# Patient Record
Sex: Female | Born: 1970 | Race: Black or African American | Hispanic: No | Marital: Single | Smoking: Current every day smoker
Health system: Southern US, Community
[De-identification: ages and names within clinical notes are randomized; demographics above are authoritative.]

## PROBLEM LIST (undated history)

## (undated) DIAGNOSIS — F32A Depression, unspecified: Secondary | ICD-10-CM

## (undated) DIAGNOSIS — G40909 Epilepsy, unspecified, not intractable, without status epilepticus: Secondary | ICD-10-CM

## (undated) DIAGNOSIS — E1040 Type 1 diabetes mellitus with diabetic neuropathy, unspecified: Secondary | ICD-10-CM

## (undated) DIAGNOSIS — F3181 Bipolar II disorder: Secondary | ICD-10-CM

## (undated) DIAGNOSIS — B1920 Unspecified viral hepatitis C without hepatic coma: Secondary | ICD-10-CM

## (undated) DIAGNOSIS — M1990 Unspecified osteoarthritis, unspecified site: Secondary | ICD-10-CM

## (undated) DIAGNOSIS — M419 Scoliosis, unspecified: Secondary | ICD-10-CM

## (undated) DIAGNOSIS — H269 Unspecified cataract: Secondary | ICD-10-CM

## (undated) DIAGNOSIS — E1110 Type 2 diabetes mellitus with ketoacidosis without coma: Secondary | ICD-10-CM

## (undated) DIAGNOSIS — R011 Cardiac murmur, unspecified: Secondary | ICD-10-CM

## (undated) DIAGNOSIS — G629 Polyneuropathy, unspecified: Secondary | ICD-10-CM

## (undated) DIAGNOSIS — K2970 Gastritis, unspecified, without bleeding: Secondary | ICD-10-CM

## (undated) DIAGNOSIS — D649 Anemia, unspecified: Secondary | ICD-10-CM

## (undated) DIAGNOSIS — N200 Calculus of kidney: Secondary | ICD-10-CM

## (undated) DIAGNOSIS — F329 Major depressive disorder, single episode, unspecified: Secondary | ICD-10-CM

## (undated) DIAGNOSIS — I10 Essential (primary) hypertension: Secondary | ICD-10-CM

## (undated) DIAGNOSIS — F4310 Post-traumatic stress disorder, unspecified: Secondary | ICD-10-CM

---

## 1998-01-12 ENCOUNTER — Other Ambulatory Visit: Admission: RE | Admit: 1998-01-12 | Discharge: 1998-01-12 | Payer: Self-pay

## 1998-08-04 ENCOUNTER — Emergency Department (HOSPITAL_COMMUNITY): Admission: EM | Admit: 1998-08-04 | Discharge: 1998-08-04 | Payer: Self-pay | Admitting: Emergency Medicine

## 1998-08-23 ENCOUNTER — Observation Stay (HOSPITAL_COMMUNITY): Admission: AD | Admit: 1998-08-23 | Discharge: 1998-08-24 | Payer: Self-pay | Admitting: *Deleted

## 1998-09-18 ENCOUNTER — Emergency Department (HOSPITAL_COMMUNITY): Admission: EM | Admit: 1998-09-18 | Discharge: 1998-09-18 | Payer: Self-pay | Admitting: *Deleted

## 1999-01-17 ENCOUNTER — Emergency Department (HOSPITAL_COMMUNITY): Admission: EM | Admit: 1999-01-17 | Discharge: 1999-01-17 | Payer: Self-pay | Admitting: Emergency Medicine

## 1999-01-17 ENCOUNTER — Encounter: Payer: Self-pay | Admitting: Emergency Medicine

## 1999-01-21 ENCOUNTER — Inpatient Hospital Stay (HOSPITAL_COMMUNITY): Admission: EM | Admit: 1999-01-21 | Discharge: 1999-01-24 | Payer: Self-pay | Admitting: Emergency Medicine

## 1999-02-02 ENCOUNTER — Emergency Department (HOSPITAL_COMMUNITY): Admission: EM | Admit: 1999-02-02 | Discharge: 1999-02-02 | Payer: Self-pay | Admitting: Emergency Medicine

## 1999-06-12 ENCOUNTER — Inpatient Hospital Stay (HOSPITAL_COMMUNITY): Admission: EM | Admit: 1999-06-12 | Discharge: 1999-06-15 | Payer: Self-pay | Admitting: *Deleted

## 1999-06-12 ENCOUNTER — Encounter: Payer: Self-pay | Admitting: Internal Medicine

## 1999-06-13 ENCOUNTER — Encounter: Payer: Self-pay | Admitting: Internal Medicine

## 1999-08-06 ENCOUNTER — Emergency Department (HOSPITAL_COMMUNITY): Admission: EM | Admit: 1999-08-06 | Discharge: 1999-08-07 | Payer: Self-pay | Admitting: Emergency Medicine

## 1999-10-27 ENCOUNTER — Inpatient Hospital Stay (HOSPITAL_COMMUNITY): Admission: EM | Admit: 1999-10-27 | Discharge: 1999-10-31 | Payer: Self-pay | Admitting: Emergency Medicine

## 1999-10-27 ENCOUNTER — Encounter: Payer: Self-pay | Admitting: Infectious Diseases

## 1999-12-01 ENCOUNTER — Emergency Department (HOSPITAL_COMMUNITY): Admission: EM | Admit: 1999-12-01 | Discharge: 1999-12-01 | Payer: Self-pay | Admitting: Emergency Medicine

## 1999-12-01 ENCOUNTER — Encounter: Payer: Self-pay | Admitting: Emergency Medicine

## 2000-02-10 ENCOUNTER — Emergency Department (HOSPITAL_COMMUNITY): Admission: EM | Admit: 2000-02-10 | Discharge: 2000-02-10 | Payer: Self-pay | Admitting: Emergency Medicine

## 2000-02-19 ENCOUNTER — Emergency Department (HOSPITAL_COMMUNITY): Admission: EM | Admit: 2000-02-19 | Discharge: 2000-02-19 | Payer: Self-pay | Admitting: Emergency Medicine

## 2000-02-19 ENCOUNTER — Encounter: Payer: Self-pay | Admitting: Emergency Medicine

## 2000-03-27 ENCOUNTER — Emergency Department (HOSPITAL_COMMUNITY): Admission: EM | Admit: 2000-03-27 | Discharge: 2000-03-27 | Payer: Self-pay | Admitting: *Deleted

## 2000-04-21 ENCOUNTER — Emergency Department (HOSPITAL_COMMUNITY): Admission: EM | Admit: 2000-04-21 | Discharge: 2000-04-22 | Payer: Self-pay | Admitting: Emergency Medicine

## 2000-04-27 ENCOUNTER — Encounter: Payer: Self-pay | Admitting: Family Medicine

## 2000-04-27 ENCOUNTER — Inpatient Hospital Stay (HOSPITAL_COMMUNITY): Admission: EM | Admit: 2000-04-27 | Discharge: 2000-04-30 | Payer: Self-pay | Admitting: Emergency Medicine

## 2000-06-01 ENCOUNTER — Encounter: Payer: Self-pay | Admitting: Emergency Medicine

## 2000-06-01 ENCOUNTER — Inpatient Hospital Stay (HOSPITAL_COMMUNITY): Admission: EM | Admit: 2000-06-01 | Discharge: 2000-06-03 | Payer: Self-pay | Admitting: Emergency Medicine

## 2000-12-11 ENCOUNTER — Inpatient Hospital Stay (HOSPITAL_COMMUNITY): Admission: EM | Admit: 2000-12-11 | Discharge: 2000-12-12 | Payer: Self-pay | Admitting: Emergency Medicine

## 2000-12-11 ENCOUNTER — Encounter: Payer: Self-pay | Admitting: Emergency Medicine

## 2000-12-12 ENCOUNTER — Encounter: Payer: Self-pay | Admitting: Internal Medicine

## 2001-07-29 ENCOUNTER — Emergency Department (HOSPITAL_COMMUNITY): Admission: EM | Admit: 2001-07-29 | Discharge: 2001-07-30 | Payer: Self-pay | Admitting: *Deleted

## 2003-01-16 ENCOUNTER — Emergency Department (HOSPITAL_COMMUNITY): Admission: EM | Admit: 2003-01-16 | Discharge: 2003-01-16 | Payer: Self-pay | Admitting: Emergency Medicine

## 2003-01-27 ENCOUNTER — Emergency Department (HOSPITAL_COMMUNITY): Admission: EM | Admit: 2003-01-27 | Discharge: 2003-01-27 | Payer: Self-pay | Admitting: Emergency Medicine

## 2003-01-28 ENCOUNTER — Encounter: Payer: Self-pay | Admitting: Internal Medicine

## 2003-01-28 ENCOUNTER — Encounter: Admission: RE | Admit: 2003-01-28 | Discharge: 2003-01-28 | Payer: Self-pay | Admitting: Internal Medicine

## 2003-01-28 ENCOUNTER — Ambulatory Visit (HOSPITAL_COMMUNITY): Admission: RE | Admit: 2003-01-28 | Discharge: 2003-01-28 | Payer: Self-pay | Admitting: Internal Medicine

## 2003-02-11 ENCOUNTER — Encounter: Admission: RE | Admit: 2003-02-11 | Discharge: 2003-02-11 | Payer: Self-pay | Admitting: Internal Medicine

## 2003-02-22 ENCOUNTER — Encounter: Admission: RE | Admit: 2003-02-22 | Discharge: 2003-02-22 | Payer: Self-pay | Admitting: Internal Medicine

## 2003-03-01 ENCOUNTER — Encounter: Admission: RE | Admit: 2003-03-01 | Discharge: 2003-03-01 | Payer: Self-pay | Admitting: Internal Medicine

## 2003-05-23 ENCOUNTER — Emergency Department (HOSPITAL_COMMUNITY): Admission: EM | Admit: 2003-05-23 | Discharge: 2003-05-23 | Payer: Self-pay | Admitting: Emergency Medicine

## 2003-06-14 ENCOUNTER — Inpatient Hospital Stay (HOSPITAL_COMMUNITY): Admission: EM | Admit: 2003-06-14 | Discharge: 2003-06-16 | Payer: Self-pay | Admitting: Emergency Medicine

## 2003-11-22 ENCOUNTER — Inpatient Hospital Stay (HOSPITAL_COMMUNITY): Admission: EM | Admit: 2003-11-22 | Discharge: 2003-11-26 | Payer: Self-pay | Admitting: Emergency Medicine

## 2003-12-08 ENCOUNTER — Encounter: Admission: RE | Admit: 2003-12-08 | Discharge: 2003-12-08 | Payer: Self-pay | Admitting: Internal Medicine

## 2004-03-01 ENCOUNTER — Emergency Department (HOSPITAL_COMMUNITY): Admission: EM | Admit: 2004-03-01 | Discharge: 2004-03-01 | Payer: Self-pay | Admitting: Emergency Medicine

## 2004-03-09 ENCOUNTER — Ambulatory Visit: Payer: Self-pay | Admitting: Internal Medicine

## 2004-03-12 ENCOUNTER — Emergency Department (HOSPITAL_COMMUNITY): Admission: EM | Admit: 2004-03-12 | Discharge: 2004-03-13 | Payer: Self-pay | Admitting: Emergency Medicine

## 2005-06-17 HISTORY — PX: ANKLE SURGERY: SHX546

## 2006-04-18 ENCOUNTER — Emergency Department (HOSPITAL_COMMUNITY): Admission: EM | Admit: 2006-04-18 | Discharge: 2006-04-18 | Payer: Self-pay | Admitting: *Deleted

## 2006-04-25 ENCOUNTER — Emergency Department (HOSPITAL_COMMUNITY): Admission: EM | Admit: 2006-04-25 | Discharge: 2006-04-26 | Payer: Self-pay | Admitting: Emergency Medicine

## 2006-06-04 ENCOUNTER — Ambulatory Visit: Admission: RE | Admit: 2006-06-04 | Discharge: 2006-06-04 | Payer: Self-pay | Admitting: Orthopaedic Surgery

## 2006-07-04 ENCOUNTER — Emergency Department (HOSPITAL_COMMUNITY): Admission: EM | Admit: 2006-07-04 | Discharge: 2006-07-04 | Payer: Self-pay | Admitting: Emergency Medicine

## 2006-07-10 ENCOUNTER — Ambulatory Visit: Payer: Self-pay | Admitting: Nurse Practitioner

## 2006-07-14 ENCOUNTER — Ambulatory Visit: Payer: Self-pay | Admitting: Nurse Practitioner

## 2006-08-01 ENCOUNTER — Inpatient Hospital Stay (HOSPITAL_COMMUNITY): Admission: RE | Admit: 2006-08-01 | Discharge: 2006-08-03 | Payer: Self-pay | Admitting: Orthopaedic Surgery

## 2006-08-22 ENCOUNTER — Emergency Department (HOSPITAL_COMMUNITY): Admission: EM | Admit: 2006-08-22 | Discharge: 2006-08-23 | Payer: Self-pay | Admitting: Emergency Medicine

## 2006-08-27 ENCOUNTER — Emergency Department (HOSPITAL_COMMUNITY): Admission: EM | Admit: 2006-08-27 | Discharge: 2006-08-27 | Payer: Self-pay | Admitting: Emergency Medicine

## 2006-08-29 ENCOUNTER — Emergency Department (HOSPITAL_COMMUNITY): Admission: EM | Admit: 2006-08-29 | Discharge: 2006-08-30 | Payer: Self-pay | Admitting: Emergency Medicine

## 2006-08-31 ENCOUNTER — Emergency Department (HOSPITAL_COMMUNITY): Admission: EM | Admit: 2006-08-31 | Discharge: 2006-08-31 | Payer: Self-pay | Admitting: Emergency Medicine

## 2006-10-24 ENCOUNTER — Ambulatory Visit (HOSPITAL_COMMUNITY): Admission: RE | Admit: 2006-10-24 | Discharge: 2006-10-25 | Payer: Self-pay | Admitting: Orthopaedic Surgery

## 2007-02-24 ENCOUNTER — Emergency Department (HOSPITAL_COMMUNITY): Admission: EM | Admit: 2007-02-24 | Discharge: 2007-02-24 | Payer: Self-pay | Admitting: *Deleted

## 2007-02-25 ENCOUNTER — Emergency Department (HOSPITAL_COMMUNITY): Admission: EM | Admit: 2007-02-25 | Discharge: 2007-02-25 | Payer: Self-pay | Admitting: Emergency Medicine

## 2007-06-01 ENCOUNTER — Emergency Department (HOSPITAL_COMMUNITY): Admission: EM | Admit: 2007-06-01 | Discharge: 2007-06-01 | Payer: Self-pay | Admitting: Emergency Medicine

## 2007-06-26 ENCOUNTER — Inpatient Hospital Stay (HOSPITAL_COMMUNITY): Admission: EM | Admit: 2007-06-26 | Discharge: 2007-06-29 | Payer: Self-pay | Admitting: Emergency Medicine

## 2007-07-17 ENCOUNTER — Ambulatory Visit: Payer: Self-pay | Admitting: Internal Medicine

## 2007-07-17 ENCOUNTER — Encounter (INDEPENDENT_AMBULATORY_CARE_PROVIDER_SITE_OTHER): Payer: Self-pay | Admitting: Nurse Practitioner

## 2007-08-03 ENCOUNTER — Ambulatory Visit: Payer: Self-pay | Admitting: Internal Medicine

## 2007-08-03 ENCOUNTER — Encounter (INDEPENDENT_AMBULATORY_CARE_PROVIDER_SITE_OTHER): Payer: Self-pay | Admitting: Nurse Practitioner

## 2007-08-03 LAB — CONVERTED CEMR LAB
Cholesterol: 164 mg/dL (ref 0–200)
LDL Cholesterol: 82 mg/dL (ref 0–99)
Total CHOL/HDL Ratio: 2.7
Triglycerides: 111 mg/dL (ref <150)
VLDL: 22 mg/dL (ref 0–40)

## 2008-05-10 ENCOUNTER — Ambulatory Visit: Payer: Self-pay | Admitting: *Deleted

## 2008-06-06 ENCOUNTER — Ambulatory Visit: Payer: Self-pay | Admitting: Family Medicine

## 2008-06-08 ENCOUNTER — Ambulatory Visit: Payer: Self-pay | Admitting: Internal Medicine

## 2008-06-21 ENCOUNTER — Ambulatory Visit: Payer: Self-pay | Admitting: Internal Medicine

## 2008-06-21 ENCOUNTER — Encounter (INDEPENDENT_AMBULATORY_CARE_PROVIDER_SITE_OTHER): Payer: Self-pay | Admitting: Internal Medicine

## 2008-06-21 LAB — CONVERTED CEMR LAB
ALT: 92 units/L — ABNORMAL HIGH (ref 0–35)
AST: 121 units/L — ABNORMAL HIGH (ref 0–37)
Albumin: 3.7 g/dL (ref 3.5–5.2)
Basophils Absolute: 0 10*3/uL (ref 0.0–0.1)
Basophils Relative: 0 % (ref 0–1)
Calcium: 9 mg/dL (ref 8.4–10.5)
Chloride: 104 meq/L (ref 96–112)
MCHC: 30.5 g/dL (ref 30.0–36.0)
Neutro Abs: 3 10*3/uL (ref 1.7–7.7)
Neutrophils Relative %: 36 % — ABNORMAL LOW (ref 43–77)
Potassium: 4.2 meq/L (ref 3.5–5.3)
RDW: 16.9 % — ABNORMAL HIGH (ref 11.5–15.5)
Sodium: 139 meq/L (ref 135–145)
TSH: 4.178 microintl units/mL (ref 0.350–4.50)
Total CHOL/HDL Ratio: 3.7
Total Protein: 8 g/dL (ref 6.0–8.3)

## 2008-06-22 ENCOUNTER — Encounter (INDEPENDENT_AMBULATORY_CARE_PROVIDER_SITE_OTHER): Payer: Self-pay | Admitting: Internal Medicine

## 2008-06-22 LAB — CONVERTED CEMR LAB
Hep A Total Ab: POSITIVE — AB
Hep B S Ab: NEGATIVE

## 2008-08-11 ENCOUNTER — Ambulatory Visit: Payer: Self-pay | Admitting: Family Medicine

## 2008-08-16 ENCOUNTER — Encounter (INDEPENDENT_AMBULATORY_CARE_PROVIDER_SITE_OTHER): Payer: Self-pay | Admitting: Internal Medicine

## 2008-08-16 ENCOUNTER — Ambulatory Visit: Payer: Self-pay | Admitting: Internal Medicine

## 2008-08-16 LAB — CONVERTED CEMR LAB: Microalb, Ur: 0.5 mg/dL (ref 0.00–1.89)

## 2008-08-18 ENCOUNTER — Encounter (INDEPENDENT_AMBULATORY_CARE_PROVIDER_SITE_OTHER): Payer: Self-pay | Admitting: Internal Medicine

## 2008-08-18 ENCOUNTER — Ambulatory Visit: Payer: Self-pay | Admitting: Internal Medicine

## 2008-08-18 LAB — CONVERTED CEMR LAB
Alkaline Phosphatase: 80 units/L (ref 39–117)
HCV Quantitative: 8820000 intl units/mL — ABNORMAL HIGH (ref <43)
Total Protein: 7.4 g/dL (ref 6.0–8.3)

## 2008-09-08 ENCOUNTER — Encounter (INDEPENDENT_AMBULATORY_CARE_PROVIDER_SITE_OTHER): Payer: Self-pay | Admitting: *Deleted

## 2008-09-22 ENCOUNTER — Ambulatory Visit: Payer: Self-pay | Admitting: Internal Medicine

## 2008-09-28 ENCOUNTER — Ambulatory Visit (HOSPITAL_COMMUNITY): Admission: RE | Admit: 2008-09-28 | Discharge: 2008-09-28 | Payer: Self-pay | Admitting: Internal Medicine

## 2008-10-07 ENCOUNTER — Encounter (INDEPENDENT_AMBULATORY_CARE_PROVIDER_SITE_OTHER): Payer: Self-pay | Admitting: *Deleted

## 2008-10-13 ENCOUNTER — Ambulatory Visit: Payer: Self-pay | Admitting: Gastroenterology

## 2008-10-24 ENCOUNTER — Ambulatory Visit: Payer: Self-pay | Admitting: Internal Medicine

## 2008-11-28 ENCOUNTER — Ambulatory Visit: Payer: Self-pay | Admitting: Internal Medicine

## 2008-11-29 ENCOUNTER — Ambulatory Visit: Payer: Self-pay | Admitting: Internal Medicine

## 2008-12-26 ENCOUNTER — Ambulatory Visit: Payer: Self-pay | Admitting: Internal Medicine

## 2008-12-27 ENCOUNTER — Encounter (INDEPENDENT_AMBULATORY_CARE_PROVIDER_SITE_OTHER): Payer: Self-pay | Admitting: Diagnostic Radiology

## 2008-12-27 ENCOUNTER — Ambulatory Visit (HOSPITAL_COMMUNITY): Admission: RE | Admit: 2008-12-27 | Discharge: 2008-12-27 | Payer: Self-pay | Admitting: Gastroenterology

## 2008-12-28 ENCOUNTER — Ambulatory Visit (HOSPITAL_COMMUNITY): Admission: RE | Admit: 2008-12-28 | Discharge: 2008-12-28 | Payer: Self-pay | Admitting: Gastroenterology

## 2009-01-26 ENCOUNTER — Ambulatory Visit: Payer: Self-pay | Admitting: Gastroenterology

## 2009-01-27 ENCOUNTER — Ambulatory Visit: Payer: Self-pay | Admitting: Internal Medicine

## 2009-02-01 ENCOUNTER — Ambulatory Visit: Payer: Self-pay | Admitting: Family Medicine

## 2009-05-01 ENCOUNTER — Encounter (INDEPENDENT_AMBULATORY_CARE_PROVIDER_SITE_OTHER): Payer: Self-pay | Admitting: Internal Medicine

## 2009-05-01 ENCOUNTER — Ambulatory Visit: Payer: Self-pay | Admitting: Family Medicine

## 2009-07-04 ENCOUNTER — Inpatient Hospital Stay (HOSPITAL_COMMUNITY): Admission: EM | Admit: 2009-07-04 | Discharge: 2009-07-06 | Payer: Self-pay | Admitting: Emergency Medicine

## 2009-08-18 ENCOUNTER — Encounter (INDEPENDENT_AMBULATORY_CARE_PROVIDER_SITE_OTHER): Payer: Self-pay | Admitting: Internal Medicine

## 2009-08-18 ENCOUNTER — Ambulatory Visit: Payer: Self-pay | Admitting: Family Medicine

## 2009-08-18 LAB — CONVERTED CEMR LAB: Microalb, Ur: 0.5 mg/dL (ref 0.00–1.89)

## 2009-12-04 ENCOUNTER — Ambulatory Visit: Payer: Self-pay | Admitting: Family Medicine

## 2009-12-04 ENCOUNTER — Encounter (INDEPENDENT_AMBULATORY_CARE_PROVIDER_SITE_OTHER): Payer: Self-pay | Admitting: Internal Medicine

## 2009-12-04 LAB — CONVERTED CEMR LAB
ALT: 50 units/L — ABNORMAL HIGH (ref 0–35)
AST: 59 units/L — ABNORMAL HIGH (ref 0–37)
Albumin: 4.2 g/dL (ref 3.5–5.2)
Calcium: 9.9 mg/dL (ref 8.4–10.5)
Chloride: 100 meq/L (ref 96–112)
HCT: 37.6 % (ref 36.0–46.0)
Lymphocytes Relative: 54 % — ABNORMAL HIGH (ref 12–46)
Lymphs Abs: 5.6 10*3/uL — ABNORMAL HIGH (ref 0.7–4.0)
Neutro Abs: 4 10*3/uL (ref 1.7–7.7)
Neutrophils Relative %: 38 % — ABNORMAL LOW (ref 43–77)
Platelets: 114 10*3/uL — ABNORMAL LOW (ref 150–400)
Potassium: 4.4 meq/L (ref 3.5–5.3)
Sodium: 132 meq/L — ABNORMAL LOW (ref 135–145)
WBC: 10.4 10*3/uL (ref 4.0–10.5)

## 2010-02-07 ENCOUNTER — Observation Stay (HOSPITAL_COMMUNITY): Admission: EM | Admit: 2010-02-07 | Discharge: 2010-02-07 | Payer: Self-pay | Admitting: Emergency Medicine

## 2010-04-15 ENCOUNTER — Inpatient Hospital Stay (HOSPITAL_COMMUNITY): Admission: EM | Admit: 2010-04-15 | Discharge: 2010-04-18 | Payer: Self-pay | Admitting: Emergency Medicine

## 2010-04-21 ENCOUNTER — Emergency Department (HOSPITAL_COMMUNITY)
Admission: EM | Admit: 2010-04-21 | Discharge: 2010-04-21 | Payer: Self-pay | Source: Home / Self Care | Admitting: Emergency Medicine

## 2010-07-08 ENCOUNTER — Encounter: Payer: Self-pay | Admitting: Internal Medicine

## 2010-08-29 LAB — HIV ANTIBODY (ROUTINE TESTING W REFLEX): HIV: NONREACTIVE

## 2010-08-29 LAB — COMPREHENSIVE METABOLIC PANEL
ALT: 54 U/L — ABNORMAL HIGH (ref 0–35)
AST: 52 U/L — ABNORMAL HIGH (ref 0–37)
Alkaline Phosphatase: 116 U/L (ref 39–117)
BUN: 9 mg/dL (ref 6–23)
CO2: 20 mEq/L (ref 19–32)
CO2: 24 mEq/L (ref 19–32)
Calcium: 8.6 mg/dL (ref 8.4–10.5)
Chloride: 94 mEq/L — ABNORMAL LOW (ref 96–112)
Creatinine, Ser: 0.52 mg/dL (ref 0.4–1.2)
GFR calc Af Amer: >60 mL/min (ref >60)
GFR calc non Af Amer: 52 mL/min — ABNORMAL LOW (ref >60)
GFR calc non Af Amer: >60 mL/min (ref >60)
Glucose, Bld: 275 mg/dL — ABNORMAL HIGH (ref 70–99)
Potassium: 5 mEq/L (ref 3.5–5.1)
Sodium: 133 mEq/L — ABNORMAL LOW (ref 135–145)
Sodium: 135 mEq/L (ref 135–145)
Total Bilirubin: 1.3 mg/dL — ABNORMAL HIGH (ref 0.3–1.2)
Total Protein: 6 g/dL (ref 6.0–8.3)

## 2010-08-29 LAB — DIFFERENTIAL
Basophils Absolute: 0 10*3/uL (ref 0.0–0.1)
Basophils Absolute: 0 10*3/uL (ref 0.0–0.1)
Basophils Absolute: 0.1 10*3/uL (ref 0.0–0.1)
Basophils Relative: 1 % (ref 0–1)
Eosinophils Absolute: 0.4 10*3/uL (ref 0.0–0.7)
Eosinophils Relative: 1 % (ref 0–5)
Eosinophils Relative: 4 % (ref 0–5)
Lymphocytes Relative: 25 % (ref 12–46)
Lymphs Abs: 4.9 10*3/uL — ABNORMAL HIGH (ref 0.7–4.0)
Monocytes Absolute: 0.6 10*3/uL (ref 0.1–1.0)
Monocytes Absolute: 0.8 10*3/uL (ref 0.1–1.0)
Monocytes Absolute: 1 10*3/uL (ref 0.1–1.0)
Monocytes Relative: 7 % (ref 3–12)
Monocytes Relative: 7 % (ref 3–12)
Neutro Abs: 5.2 10*3/uL (ref 1.7–7.7)

## 2010-08-29 LAB — GLUCOSE, CAPILLARY
Glucose-Capillary: 125 mg/dL — ABNORMAL HIGH (ref 70–99)
Glucose-Capillary: 125 mg/dL — ABNORMAL HIGH (ref 70–99)
Glucose-Capillary: 154 mg/dL — ABNORMAL HIGH (ref 70–99)
Glucose-Capillary: 161 mg/dL — ABNORMAL HIGH (ref 70–99)
Glucose-Capillary: 211 mg/dL — ABNORMAL HIGH (ref 70–99)
Glucose-Capillary: 222 mg/dL — ABNORMAL HIGH (ref 70–99)
Glucose-Capillary: 228 mg/dL — ABNORMAL HIGH (ref 70–99)
Glucose-Capillary: 282 mg/dL — ABNORMAL HIGH (ref 70–99)
Glucose-Capillary: 305 mg/dL — ABNORMAL HIGH (ref 70–99)
Glucose-Capillary: 320 mg/dL — ABNORMAL HIGH (ref 70–99)
Glucose-Capillary: 340 mg/dL — ABNORMAL HIGH (ref 70–99)
Glucose-Capillary: 344 mg/dL — ABNORMAL HIGH (ref 70–99)
Glucose-Capillary: 428 mg/dL — ABNORMAL HIGH (ref 70–99)

## 2010-08-29 LAB — CBC
HCT: 35.6 % — ABNORMAL LOW (ref 36.0–46.0)
HCT: 36.3 % (ref 36.0–46.0)
HCT: 36.5 % (ref 36.0–46.0)
Hemoglobin: 11.4 g/dL — ABNORMAL LOW (ref 12.0–15.0)
Hemoglobin: 11.4 g/dL — ABNORMAL LOW (ref 12.0–15.0)
Hemoglobin: 11.8 g/dL — ABNORMAL LOW (ref 12.0–15.0)
Hemoglobin: 11.8 g/dL — ABNORMAL LOW (ref 12.0–15.0)
Hemoglobin: 14.7 g/dL (ref 12.0–15.0)
MCH: 24.3 pg — ABNORMAL LOW (ref 26.0–34.0)
MCH: 24.3 pg — ABNORMAL LOW (ref 26.0–34.0)
MCH: 24.3 pg — ABNORMAL LOW (ref 26.0–34.0)
MCH: 24.3 pg — ABNORMAL LOW (ref 26.0–34.0)
MCHC: 31.9 g/dL (ref 30.0–36.0)
MCHC: 32 g/dL (ref 30.0–36.0)
MCHC: 32.5 g/dL (ref 30.0–36.0)
MCV: 73.6 fL — ABNORMAL LOW (ref 78.0–100.0)
MCV: 76.2 fL — ABNORMAL LOW (ref 78.0–100.0)
MCV: 76.2 fL — ABNORMAL LOW (ref 78.0–100.0)
RBC: 4.67 MIL/uL (ref 3.87–5.11)
RBC: 6.05 MIL/uL — ABNORMAL HIGH (ref 3.87–5.11)
RDW: 14 % (ref 11.5–15.5)
RDW: 14.4 % (ref 11.5–15.5)
RDW: 14.5 % (ref 11.5–15.5)
WBC: 11.1 10*3/uL — ABNORMAL HIGH (ref 4.0–10.5)
WBC: 14.5 10*3/uL — ABNORMAL HIGH (ref 4.0–10.5)

## 2010-08-29 LAB — URINE MICROSCOPIC-ADD ON

## 2010-08-29 LAB — CARDIAC PANEL(CRET KIN+CKTOT+MB+TROPI)
CK, MB: 1.4 ng/mL (ref 0.3–4.0)
CK, MB: 1.5 ng/mL (ref 0.3–4.0)
Relative Index: INVALID (ref 0.0–2.5)
Relative Index: INVALID (ref 0.0–2.5)
Total CK: 57 U/L (ref 7–177)
Total CK: 60 U/L (ref 7–177)
Total CK: 80 U/L (ref 7–177)
Troponin I: <0.01 ng/mL (ref 0.00–0.06)

## 2010-08-29 LAB — BASIC METABOLIC PANEL
BUN: 10 mg/dL (ref 6–23)
BUN: 11 mg/dL (ref 6–23)
BUN: 12 mg/dL (ref 6–23)
BUN: 13 mg/dL (ref 6–23)
BUN: 13 mg/dL (ref 6–23)
BUN: 4 mg/dL — ABNORMAL LOW (ref 6–23)
CO2: 24 mEq/L (ref 19–32)
CO2: 24 mEq/L (ref 19–32)
CO2: 25 mEq/L (ref 19–32)
CO2: 27 mEq/L (ref 19–32)
Calcium: 8.5 mg/dL (ref 8.4–10.5)
Calcium: 8.5 mg/dL (ref 8.4–10.5)
Calcium: 8.5 mg/dL (ref 8.4–10.5)
Calcium: 8.7 mg/dL (ref 8.4–10.5)
Calcium: 8.8 mg/dL (ref 8.4–10.5)
Chloride: 103 mEq/L (ref 96–112)
Chloride: 106 mEq/L (ref 96–112)
Chloride: 106 mEq/L (ref 96–112)
Chloride: 106 mEq/L (ref 96–112)
Creatinine, Ser: 0.67 mg/dL (ref 0.4–1.2)
Creatinine, Ser: 0.88 mg/dL (ref 0.4–1.2)
Creatinine, Ser: 0.98 mg/dL (ref 0.4–1.2)
GFR calc Af Amer: >60 mL/min (ref >60)
GFR calc non Af Amer: >60 mL/min (ref >60)
GFR calc non Af Amer: >60 mL/min (ref >60)
Glucose, Bld: 141 mg/dL — ABNORMAL HIGH (ref 70–99)
Glucose, Bld: 240 mg/dL — ABNORMAL HIGH (ref 70–99)
Glucose, Bld: 267 mg/dL — ABNORMAL HIGH (ref 70–99)
Glucose, Bld: 328 mg/dL — ABNORMAL HIGH (ref 70–99)
Glucose, Bld: 336 mg/dL — ABNORMAL HIGH (ref 70–99)
Glucose, Bld: 344 mg/dL — ABNORMAL HIGH (ref 70–99)
Potassium: 3.3 mEq/L — ABNORMAL LOW (ref 3.5–5.1)
Potassium: 3.8 mEq/L (ref 3.5–5.1)
Potassium: 3.9 mEq/L (ref 3.5–5.1)
Potassium: 3.9 mEq/L (ref 3.5–5.1)
Potassium: 4.4 mEq/L (ref 3.5–5.1)
Sodium: 134 mEq/L — ABNORMAL LOW (ref 135–145)
Sodium: 134 mEq/L — ABNORMAL LOW (ref 135–145)
Sodium: 136 mEq/L (ref 135–145)
Sodium: 137 mEq/L (ref 135–145)

## 2010-08-29 LAB — URINALYSIS, ROUTINE W REFLEX MICROSCOPIC
Bilirubin Urine: NEGATIVE
Glucose, UA: >1000 mg/dL — AB
Ketones, ur: >80 mg/dL — AB
Leukocytes, UA: NEGATIVE
Specific Gravity, Urine: 1.01 (ref 1.005–1.030)
pH: 5 (ref 5.0–8.0)

## 2010-08-29 LAB — LIPID PANEL
Cholesterol: 134 mg/dL (ref 0–200)
HDL: 40 mg/dL (ref >39)
Total CHOL/HDL Ratio: 3.4 RATIO

## 2010-08-29 LAB — ETHANOL: Alcohol, Ethyl (B): <5 mg/dL (ref 0–10)

## 2010-08-29 LAB — BLOOD GAS, ARTERIAL
Patient temperature: 98.6
TCO2: 18.2 mmol/L (ref 0–100)
pH, Arterial: 7.377 (ref 7.350–7.400)

## 2010-08-29 LAB — RAPID URINE DRUG SCREEN, HOSP PERFORMED: Cocaine: NOT DETECTED

## 2010-08-29 LAB — ACETAMINOPHEN LEVEL: Acetaminophen (Tylenol), Serum: <10 ug/mL — ABNORMAL LOW (ref 10–30)

## 2010-08-31 LAB — URINALYSIS, ROUTINE W REFLEX MICROSCOPIC
Glucose, UA: >1000 mg/dL — AB
Ketones, ur: NEGATIVE mg/dL
Protein, ur: NEGATIVE mg/dL
Urobilinogen, UA: 0.2 mg/dL (ref 0.0–1.0)

## 2010-08-31 LAB — POCT PREGNANCY, URINE: Preg Test, Ur: NEGATIVE

## 2010-08-31 LAB — POCT I-STAT, CHEM 8
BUN: 11 mg/dL (ref 6–23)
Chloride: 102 mEq/L (ref 96–112)
Creatinine, Ser: 0.5 mg/dL (ref 0.4–1.2)
Sodium: 135 mEq/L (ref 135–145)

## 2010-08-31 LAB — URINE MICROSCOPIC-ADD ON

## 2010-08-31 LAB — GLUCOSE, CAPILLARY: Glucose-Capillary: 159 mg/dL — ABNORMAL HIGH (ref 70–99)

## 2010-09-03 LAB — CBC
MCV: 79.3 fL (ref 78.0–100.0)
Platelets: 119 10*3/uL — ABNORMAL LOW (ref 150–400)
Platelets: 136 10*3/uL — ABNORMAL LOW (ref 150–400)
Platelets: 141 10*3/uL — ABNORMAL LOW (ref 150–400)
RBC: 5.18 MIL/uL — ABNORMAL HIGH (ref 3.87–5.11)
RDW: 14 % (ref 11.5–15.5)
WBC: 10.8 10*3/uL — ABNORMAL HIGH (ref 4.0–10.5)
WBC: 17.9 10*3/uL — ABNORMAL HIGH (ref 4.0–10.5)
WBC: 18.7 10*3/uL — ABNORMAL HIGH (ref 4.0–10.5)

## 2010-09-03 LAB — COMPREHENSIVE METABOLIC PANEL
ALT: 69 U/L — ABNORMAL HIGH (ref 0–35)
AST: 62 U/L — ABNORMAL HIGH (ref 0–37)
Albumin: 3.9 g/dL (ref 3.5–5.2)
Chloride: 93 mEq/L — ABNORMAL LOW (ref 96–112)
Creatinine, Ser: 1.27 mg/dL — ABNORMAL HIGH (ref 0.4–1.2)
GFR calc Af Amer: 57 mL/min — ABNORMAL LOW (ref >60)
Potassium: 4.8 mEq/L (ref 3.5–5.1)
Sodium: 133 mEq/L — ABNORMAL LOW (ref 135–145)
Total Bilirubin: 1.1 mg/dL (ref 0.3–1.2)

## 2010-09-03 LAB — GLUCOSE, CAPILLARY
Glucose-Capillary: 112 mg/dL — ABNORMAL HIGH (ref 70–99)
Glucose-Capillary: 152 mg/dL — ABNORMAL HIGH (ref 70–99)
Glucose-Capillary: 165 mg/dL — ABNORMAL HIGH (ref 70–99)
Glucose-Capillary: 173 mg/dL — ABNORMAL HIGH (ref 70–99)
Glucose-Capillary: 179 mg/dL — ABNORMAL HIGH (ref 70–99)
Glucose-Capillary: 185 mg/dL — ABNORMAL HIGH (ref 70–99)
Glucose-Capillary: 189 mg/dL — ABNORMAL HIGH (ref 70–99)
Glucose-Capillary: 204 mg/dL — ABNORMAL HIGH (ref 70–99)
Glucose-Capillary: 222 mg/dL — ABNORMAL HIGH (ref 70–99)
Glucose-Capillary: 270 mg/dL — ABNORMAL HIGH (ref 70–99)
Glucose-Capillary: 284 mg/dL — ABNORMAL HIGH (ref 70–99)
Glucose-Capillary: 592 mg/dL (ref 70–99)
Glucose-Capillary: 85 mg/dL (ref 70–99)

## 2010-09-03 LAB — BASIC METABOLIC PANEL
BUN: 25 mg/dL — ABNORMAL HIGH (ref 6–23)
BUN: 28 mg/dL — ABNORMAL HIGH (ref 6–23)
Calcium: 8.2 mg/dL — ABNORMAL LOW (ref 8.4–10.5)
Calcium: 8.7 mg/dL (ref 8.4–10.5)
Chloride: 107 mEq/L (ref 96–112)
Creatinine, Ser: 0.77 mg/dL (ref 0.4–1.2)
GFR calc Af Amer: >60 mL/min (ref >60)
GFR calc Af Amer: >60 mL/min (ref >60)
GFR calc non Af Amer: >60 mL/min (ref >60)
GFR calc non Af Amer: >60 mL/min (ref >60)
Glucose, Bld: 92 mg/dL (ref 70–99)
Potassium: 3.8 mEq/L (ref 3.5–5.1)
Sodium: 136 mEq/L (ref 135–145)

## 2010-09-03 LAB — WET PREP, GENITAL: Yeast Wet Prep HPF POC: NONE SEEN

## 2010-09-03 LAB — HEMOGLOBIN A1C
Hgb A1c MFr Bld: 12.5 % — ABNORMAL HIGH (ref 4.6–6.1)
Mean Plasma Glucose: 312 mg/dL

## 2010-09-03 LAB — GC/CHLAMYDIA PROBE AMP, GENITAL: Chlamydia, DNA Probe: NEGATIVE

## 2010-09-03 LAB — KETONES, QUALITATIVE

## 2010-09-03 LAB — BLOOD GAS, VENOUS
Acid-base deficit: 0.3 mmol/L (ref 0.0–2.0)
TCO2: 22.1 mmol/L (ref 0–100)
pCO2, Ven: 42.5 mmHg — ABNORMAL LOW (ref 45.0–50.0)
pO2, Ven: 43.9 mmHg (ref 30.0–45.0)

## 2010-09-03 LAB — DIFFERENTIAL
Basophils Absolute: 0 10*3/uL (ref 0.0–0.1)
Eosinophils Absolute: 0 10*3/uL (ref 0.0–0.7)
Eosinophils Relative: 0 % (ref 0–5)
Lymphocytes Relative: 22 % (ref 12–46)
Monocytes Absolute: 0.7 10*3/uL (ref 0.1–1.0)

## 2010-09-03 LAB — RAPID URINE DRUG SCREEN, HOSP PERFORMED
Amphetamines: NOT DETECTED
Barbiturates: NOT DETECTED
Opiates: POSITIVE — AB

## 2010-09-24 LAB — CBC
MCHC: 32 g/dL (ref 30.0–36.0)
MCV: 79.1 fL (ref 78.0–100.0)
Platelets: 103 10*3/uL — ABNORMAL LOW (ref 150–400)

## 2010-09-24 LAB — PROTIME-INR: Prothrombin Time: 13.4 seconds (ref 11.6–15.2)

## 2010-09-24 LAB — GLUCOSE, CAPILLARY
Glucose-Capillary: 172 mg/dL — ABNORMAL HIGH (ref 70–99)
Glucose-Capillary: 92 mg/dL (ref 70–99)

## 2010-10-30 NOTE — Op Note (Signed)
NAME:  Mary [name], Mary [name]                ACCOUNT NO.:  [id number]   MEDICAL RECORD NO.:  [id number]          PATIENT TYPE:  AMB   LOCATION:  ENDO                         FACILITY:  [facility]   PHYSICIAN:  Ulyess Mort, MD  DATE OF BIRTH:  January 03, 1971   DATE OF PROCEDURE:  12/28/2008  DATE OF DISCHARGE:                               OPERATIVE REPORT   PROCEDURE PERFORMED:  Upper endoscopic examination.   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  He was  diagnosed with this a month and a half ago with her symptoms.  She has  had a history of excessive alcohol usage until 2007, where she drank  increased amounts of beer up to 12 a day for approximately 20 years.  There is a history of drug use as well until 2007, and a bipolar  disorder.  She has had diabetes since 1996.   PAST MEDICAL HISTORY:  Otherwise not significant.   She does take insulin, Wellbutrin, Seroquel, Depakote, and Amaryl.  She  smokes 2 cigarettes a day.   FAMILY HISTORY:  Negative.   PHYSICAL EXAMINATION:  VITAL SIGNS:  She is 5 feet 4, is 81 kg, blood  pressure 110/72, pulse 71 and regular, respirations 12 and regular.  Her  oxygen was 99 on O2.  NECK:  Negative.  There is no thyromegaly.  HEART:  A regular rhythm without significant murmur.  CHEST:  Clear to auscultation.  ABDOMEN:  Soft, somewhat obese, no masses or organomegaly, nontender  except over where the liver biopsy was recently done.   PROCEDURE:  The patient was premedicated with 5 mg of Versed and 50 mcg  of Fentanyl IV.  The oropharynx was anesthetized with Endocaine spray.  The Pentax endoscope was passed in the proximal esophagus without  difficulty.  The proximal esophagus was normal.  The distal esophagus  revealed a 3 cm sliding hiatal hernia and no significant esophagitis.  The stomach revealed no abnormalities whatsoever.  Pyloric channel was  slightly _stenotic and the  Duodenal bulb were entirely normal.  The  endoscope was brought back into the stomach and inverted upon itself.  The fundus and cardia were well-visualized and no other abnormalities  were seen.  There was no evidence of varices whatsoever.  The patient  tolerated the procedure nicely as the endoscope retracted completely.   IMPRESSION:  1. Esophagus:  A 3 cm hiatal hernia with no evidence of varices or      esophagitis.  2. Stomach:  Essentially normal.  3. Pyloric channel:  Normal.  4. Duodenal bulb:  Normal.   COMMENT:  I think this patient may have reflux problems, although she  denied it in her history.  She certainly does have a significant hernia,  does not seem to be bothered by it, there is no evidence of dysphagia.  I would not treat her unless she was symptomatic.  Certainly, there was  no evidence of portal hypertension at this time or significant developed  esophageal or gastric varices.  Otherwise, she has a normal study.  Ulyess Mort, MD  Electronically Signed     SML/MEDQ  D:  12/28/2008  T:  12/29/2008  Job:  [id number]   cc:   [place], [place], [id number] [address]   [facility]   Dineen Kid. Reche Dixon, M.D.  Fax: [phone]   Dr. Laddie Aquas ????

## 2010-10-30 NOTE — Discharge Summary (Signed)
NAME:  Mary [name], Mary [name]                ACCOUNT NO.:  [id number]   MEDICAL RECORD NO.:  [id number]          PATIENT TYPE:  INP   LOCATION:  1433                         [facility]ACILITY[facility]  [facility]   PHYSICIAN:  Altha Harm, MDDATE OF BIRTH:  10-26-1970   DATE OF ADMISSION:  06/26/2007  DATE OF DISCHARGE:  06/29/2007                               DISCHARGE SUMMARY   DISCHARGE DISPOSITION:  Home.   FINAL DISCHARGE DIAGNOSES:  1. Diabetic ketoacidosis, resolved.  2. Diabetes type 2, uncontrolled.  3. Bipolar disorder.  4. History of polysubstance abuse in the past, now recovered, two      years.  5. Abdominal pain, resolved.  6. Renal insufficiency, resolved.  7. Transaminitis, resolved.   DISCHARGE MEDICATIONS:  1. Depakote 500 mg in the a.m. and 100 mg in the p.m.  2. Seroquel 300 mg in the p.m.  3. Wellbutrin 150 mg in the a.m.  4. Lantus 50 units subcu nightly.  5. Amaryl 2 mg p.o. daily q.a.m. with meals.   CONSULTS:  None.   PROCEDURES:  None.   DIAGNOSTIC STUDIES:  1. Ultrasound of the abdomen:  This shows a negative abdominal      ultrasound.  No evidence of acute cholecystitis and no evidence of      acute renal abnormality or obstruction.  2. Chest x-ray, portable.  This shows the PICC line is in the SVC and      RA junction.  No acute chest process.   ALLERGIES:  PENICILLIN.   CODE STATUS:  Full code.   PRIMARY CARE PHYSICIAN:  [facility].   CHIEF COMPLAINT:  Abdominal pain, nausea and vomiting.   HISTORY OF PRESENT ILLNESS:  Please see H&P dictated by Dr. Roxan Hockey for  details of the HPI.   HOSPITAL COURSE:  The patient was found to be in diabetic ketoacidosis.  She was started on an insulin drip with aggressive hydration.  The  patient resolved her acidosis and continued to have hyperglycemia.  The  patient was placed on a basal bolus regimen of insulin, and blood sugars  remained 80s-130s.   The patient is being sent home on the above-stated  medications.  She is  clinically stable at this time.  Vital signs are stable.  She is  afebrile.  Temperature 98, heart rate 78, blood pressure 106/79,  respirations 15.  The patient is ambulatory without any assistance, and  she is 100% satting on room air.  The patient has made an appointment to  be seen in [facility] on July 17, 2007.  In the meantime, she will  be given a prescription for her Lantus, Amaryl, and blood glucose  monitoring machine, where she is to monitor her blood sugars once a day,  alternating meal times.  The patient has received diabetic education,  and she is to be on a carbohydrate-modified diabetic diet.      Altha Harm, MD  Electronically Signed     MAM/MEDQ  D:  06/29/2007  T:  06/29/2007  Job:  [id number]

## 2010-10-30 NOTE — H&P (Signed)
NAME:  Mary [name], Mary [name]                ACCOUNT NO.:  [id number]   MEDICAL RECORD NO.:  [id number]          PATIENT TYPE:  EMS   LOCATION:  ED                           FACILITY:  [facility]   PHYSICIAN:  Michaelyn Barter, M.D. DATE OF BIRTH:  Sep 04, 1970   DATE OF ADMISSION:  06/26/2007  DATE OF DISCHARGE:                              HISTORY & PHYSICAL   PRIMARY CARE DOCTOR:  Unassigned.   CHIEF COMPLAINT:  Abdominal pain, nausea and vomiting.   HISTORY OF PRESENT ILLNESS:  Ms. Sperry is a 40 year old female with a past  medical history of diabetes mellitus and what appears to be numerous  previous visits to the emergency department secondary to complaints of  abdominal pain.  She indicates that she developed abdominal pain  approximately 2 months ago as well as hyperglycemia and episodes of  emesis.  She was subsequently started on metformin and indicates that  she ran out of her medications approximately 2 weeks ago.  This morning  she developed emesis and has had multiple episodes of vomiting today.  Likewise, she complains of abdominal pain.  She describes her pain as  being diffuse occurring from the top of her abdomen traveling all the  way down to the bottom of her abdomen.  She indicates that there is some  radiation to her back.  The pain is constant, movement aggravates the  pain.  She denies having any alleviating factors.  She complains of some  subjective fevers as well as chills.  No hematemesis.   PAST MEDICAL HISTORY:  1. Polysubstance abuse.  2. Diabetes disorder.  3. Bipolar disorder.  4. Hyperglycemia.  5. Medical noncompliance.  6. Dehydration.  7. Abdominal pain secondary to UTI versus diabetic ketoacidosis.  8. UTI.  9. 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  10.Diabetic ketoacidosis.   ALLERGIES:  No known drug allergies.   CURRENT MEDICATIONS:  1. Depakote 500 mg in the morning, 1,000 mg in the evening.  2. Seroquel 300 mg in the evening.  3. Wellbutrin 150  mg in the morning.  4. Metformin, the patient does not know the does of her Metformin and      again the patient indicates that she has not taken her medications      for her diabetes for at least 2 weeks.   SOCIAL HISTORY:  Alcohol:  The patient indicates that she took her last  drink of alcohol 3-4 months ago.  Crack cocaine:  Last use was 18 months  ago.  Cigarettes:  The patient admits to smoking 5 cigarettes at least  per day.   FAMILY HISTORY:  Mother had no illnesses.  Father had cirrhosis of the  liver.   REVIEW OF SYSTEMS:  As per HPI.   PHYSICAL EXAMINATION:  GENERAL:  The patient is awake.  She is  cooperative.  She is in no obvious respiratory distress.  VITAL SIGNS:  Her temperature is 97.1, blood pressure 128/91, heart rate  90, respirations 24, O2 sat is 100% on room air.  HEENT:  Normocephalic atraumatic.  Anicteric.  Extraocular movements are  intact.  Oral  mucosa is pink.  No thrush, no exudates.  The patient has  poor dentition.  NECK:  Supple.  No JVD.  No lymphadenopathy.  No thyromegaly.  CARDIAC:  S1 S2 present.  Regular rate and rhythm.  No murmurs, gallops,  or rubs.  RESPIRATORY:  No crackles or wheezes.  ABDOMEN:  Nondistended.  It is soft.  There is some generalized  tenderness to palpation.  There is no rebound and no guarding.  No  masses are palpated.  Bowel sounds are present but slightly hypoactive.  EXTREMITIES:  No leg edema.  NEUROLOGIC:  The patient is alert and oriented x3.   LABORATORY:  Urine pregnancy test is negative.  Urinalysis is negative.  Drug screen is negative.  Glucose 321, creatinine 1.23.  Bilirubin total  0.2, alk phos 85, SGOT 77, SGPT 74, total protein 8.6, albumin 3.9.  White blood cell count is 7.4, hemoglobin 13.9, hematocrit 43.1,  platelets 123.   ASSESSMENT/PLAN:  1. Abdominal pain.  The etiology is questionable.  The differential      includes gastritis, versus diabetic gastroparesis, versus an acute       pancreatitis.  We will consider imaging of the patient's abdomen.      We will provide the patient with as needed pain medication.  We      will check an amylase and lipase.  2. Intractable nausea and vomiting.  Again, we will provide the      patient with as needed antiemetics.  3. Dehydration.  We will provide gentle intravenous hydration.  4. Uncontrolled diabetes mellitus.  I suspect there may be a      relationship between the patient's poor compliance with her      diabetic medication and her current bout of abdominal pain.  Will      check her serum for ketones as well as consider an ABG.  The      patient is currently on a Glucommander, we will continue this for      now.  5. Renal insufficiency which may be secondary to dehydration,      resulting from the multiple episodes of vomiting.  We will gently      hydrate the patient for now and monitor her BUN and creatinine.  6. Transaminitis.  The source of this is questionable.  May consider      an ultrasound of the patient's abdomen, particularly since her      creatinine is slightly elevated.  May also consider ordering      hepatitis studies on the patient.  7. Gastrointestinal prophylaxis.  We will provide Protonix.      Michaelyn Barter, M.D.  Electronically Signed     OR/MEDQ  D:  06/26/2007  T:  06/26/2007  Job:  [id number]

## 2010-11-02 NOTE — Procedures (Signed)
[place]. [facility]  Patient:    Mary [name], Mary [name]                       MRN: [id number] Proc. Date: 06/02/00 Adm. Date:  [id number] Attending:  Alfonso Ramus CC:         Dineen Kid. Reche Dixon, M.D.   Procedure Report  DATE OF BIRTH:  [birth year].  PROCEDURE:  Esophagogastroduodenoscopy.  ENDOSCOPIST:  Anselmo Rod, M.D.  INSTRUMENT USED:  Olympus video panendoscope.  INDICATION FOR PROCEDURE:  Guaiac-positive stool and anemia in a 40 year old African-American female with history of alcohol and drug abuse.  Rule out esophageal varices, peptic ulcer disease, etc.  PREPROCEDURE PREPARATION:  Informed consent was procured from the patient. The patient was fasted for eight hours prior to the procedure.  PREPROCEDURE PHYSICAL:  VITAL SIGNS:  The patient had stable vital signs.  NECK:  Supple.  CHEST:  Clear to auscultation.  S1, S2 regular.  ABDOMEN:  Soft with normal abdominal bowel sounds.  DESCRIPTION OF PROCEDURE:  The patient was placed in the left lateral decubitus position and sedated with 50 mg of Demerol and 5 mg of Versed intravenously.  Once the patient was adequately sedate and maintained on low-flow oxygen and continuous cardiac monitoring, the Olympus video panendoscope was advanced through the mouthpiece, over the tongue, into the esophagus under direct vision.  The entire esophagus appeared normal without evidence of ring stricture, mass, lesion, esophagitis, or Barretts mucosa. The scope was then advanced to the stomach, where a small hiatal hernia was seen on high retroflexion.  There was mild antral gastritis, and no other abnormality was seen in the stomach.  The proximal small bowel also appeared normal.  IMPRESSION:  Normal EGD except for a small hiatal hernia and mild antral gastritis.  PLAN: 1. Change Protonix from IV to p.o. 2. Colonoscopy in a.m. to further work up the patients microcytic anemia. DD:   06/02/00 TD:  06/03/00 Job: [id number]

## 2010-11-02 NOTE — H&P (Signed)
. [facility]  Patient:    Mary [name], Mary [name]                       MRN: [id number] Adm. Date:  [id number] Attending:  Rosanne Sack                         History and Physical  HISTORY OF PRESENT ILLNESS:  Mary [name] is a 40 year old black female admitted with abdominal pain.  She has a history of polysubstance abuse and numerous emergency department visits and at least two admissions over the last year for abdominal pain.  In December of 2000, she was admitted with abdominal pain and an elevated amylase that did normalize.  In May, she was admitted for abdominal pain, workup was negative and amylase was normal.  At that time, she did have heme-positive stool but GI declined to do further evaluation endoscopically.  One week prior to admission, she presented with abdominal discomfort to the ED.  Workup was normal except for an amylase which was mildly elevated at 191.  She was discharged home.  She has continued to have intermittent abdominal pain but it has not been as severe; she has noted that it is worse with eating. Yesterday, she vomited twice and today, vomited multiple times and on one occasion, had bright red blood mixed in with emesis.  She came to the ER today when the pain worsened.  As stated above, she had heme-positive stool in May of 2000.  She had H. pylori antibody negativity at that time.  PAST MEDICAL HISTORY:  Surgical:  C-section.  Medical:  She is apparently diabetic and has been for four years.  She was prescribed a hypoglycemic about a year go but does not take it because "I do not have Medicaid."  MEDICATIONS:  See above.  ALLERGIES:  No known drug allergies.  SOCIAL HISTORY:  She is unemployed.  She does not smoke cigarettes or consume alcohol.  She does use street drugs but states, "I quit last week."  FAMILY HISTORY:  Her father has passed away, "I dont now why."  Mother is alive and well but "I dont know her  medical problems."  REVIEW OF SYSTEMS:  Occasional headaches.  All other systems are negative.  PHYSICAL EXAMINATION  GENERAL:  Thin black female in moderate distress, retching; I think to some degree, some of this was fained as she tended to look around to see if I was still in the room as she was retching.  When I left the room, she quit.  VITAL SIGNS:  Blood pressure 137/80, pulse 65, respiratory rate 16.  HEENT:  The eyes have pupils which are round and reactive to light. Extraocular movements are intact.  Ears are normal.  Nose and throat are unremarkable.  NECK:  Supple.  Thyroid is not enlarged or tender.  CHEST:  Clear to auscultation and percussion.  CARDIAC:  Normal S1 and S2, without an S3, S4, murmur, rub or click.  SKIN:  Warm and dry.  ABDOMEN:  Difficult to examine due to her writhing and lack of cooperation. When it is possible to do, the exam is somewhat inconsistent but I believe she may be tender in the upper abdomen.  RECTAL:  Heme-positive stools according to the resident working in the emergency department.  LABORATORY DATA:  Negative UA.  White count 11,000, hemoglobin 11.5, MCV 72, platelet count 144,000.  Sodium 137, potassium  3.4, chloride 104, CO2 29, BUN 11, creatinine 0.7, glucose 253.  Normal LFTs.  Amylase 143.  Lipase normal. Alcohol level less than 10.  Drug screen positive for THC, opiates, cocaine and barbiturates.  IMPRESSION 1. Abdominal pain, nausea and vomiting.  Doubt serious pancreatitis.  This    could be mild pancreatitis versus gastritis versus drug withdrawal versus    other.  Will treat with Phenergan and pain medicines.  Follow laboratory    data. 2. Polysubstance abuse:  Watch for evidence of withdrawal. 3. Diabetes mellitus:  Follow capillary blood glucoses. 4. Hypokalemia:  Replete intravenously. DD:  04/27/00 TD:  04/28/00 Job: [id number]

## 2010-11-02 NOTE — Discharge Summary (Signed)
NAME:  Mary [name], Mary [name]                          ACCOUNT NO.:  [id number]   MEDICAL RECORD NO.:  [id number]                   PATIENT TYPE:  INP   LOCATION:  5731                                 FACILITY:  [facility]   PHYSICIAN:  Jonna Clark                         DATE OF BIRTH:  Dec 06, 1970   DATE OF ADMISSION:  11/21/2003  DATE OF DISCHARGE:  11/26/2003                                 DISCHARGE SUMMARY   DISCHARGE DIAGNOSES:  1. Diabetes mellitus uncontrolled complicated by diabetic ketoacidosis.  2. Abdominal pain secondary to urinary tract infection versus diabetic     ketoacidosis, resolved.  3. Urinary tract infection.  4. Bipolar disorder.  5. Polysubstance abuse.  6. History of pancreatitis secondary to alcohol abuse.   DISCHARGE MEDICATIONS:  1. Insulin 70/30 with 26 units before breakfast and 14 units before dinner.  2. Depakote 500 mg b.i.d.  3. Augmentin 875 mg b.i.d. for 3 following days.  4. Phenergan 12.5 mg p.o. q.6h. p.r.n. nausea.   DISPOSITION:  The patient was discharged home following a followup visit in  outpatient clinic on December 08, 2003 at 1540 p.m.   FOLLOWUP:  With Dr. __________ December 05, 2003, Behavioral Health.   PROCEDURE:  1. CT of pelvis and abdomen November 23, 2003:  No pathological findings.     Specifically, the pancreas is normal.  2. CT pelvis:  Unremarkable except for the presence of right femoral venous     catheter.  This is deep in the IVC.   HISTORY OF PRESENT ILLNESS:  Mary [name] is a 40 years old African American  female with past medical history significant for multiple admissions  secondary to abdominal pain.  History of alcoholic pancreatitis,  uncontrolled diabetes mellitus type 2, bipolar disorder.  She was admitted  on November 22, 2003 with nausea, vomiting, and abdominal pain.   Initial tests, showed DKA with anion gap at 23 and 22, leukocytosis and  mildly elevated lipase and liver enzymes.   From the time of admissions, the patient was  given fluids and IV insulin.   PHYSICAL EXAMINATION:  VITAL SIGNS:  Pulse 98, blood pressure 141/81,  temperature 97.7, respiratory rate 18.  Oxygen saturation 100% on room air.  GENERAL:  Uncooperative.  HEENT:  Eyes:  PERRLA.  Vision grossly intact.  No jaundice.  ENT:  Poor  dentition.  NECK:  Supple.  No JVD and no thyromegaly.  LUNGS:  Clear to auscultation bilaterally.  Has good air movement.  CARDIOVASCULAR:  Regular rate and rhythm.  Tachycardia.  No murmur.  ABDOMEN:  Soft.  Nondistended.  Diffuse tenderness.  No rebound.  No  guarding.  Bowel sounds present.  SKIN:  No rashes.  NEUROLOGICAL:  Cranial nerves II through XII grossly intact.   LABORATORY DATA:  A pH 7.28, pCO2 24.3, bicarbonate 11.4, ____________ 13.0.  White blood  cells 16.1, red blood cells 6.63, hemoglobin 15.3, hematocrit  48.6, MCV 73.2.  MCHC 31.1.  RDW 1501.  Neutrophils 72, lymphocytes 24,  monocytes 4, eosinophils 0, basophils 0.  Sodium 134, potassium 4.7,  chloride 106, glucose 627, BUN 37, creatinine 1.2.  AST 42, ALT 35, alkaline  phosphate 124, total bilirubin 2.1, lipase 143.  Serum acetone moderate.  Hemoglobin A1C 12.9.  Urine drug screen positive for cocaine.  Pregnancy  urine negative.  Urinalysis showed glucose specific gravity at 1.029,  glucose more than 1000, ketones more than 80, bacteria few, white blood  cells 3.3 to 6, and red blood cells 0.20.   HOSPITAL COURSE:  Problem 1:  DIABETES MELLITUS WITH DIABETIC KETOACIDOSIS:  The patient was admitted with the clinical presentation of uncontrolled  diabetes mellitus, hyperglycemia, and positive 600 mg/dL anion gap acidosis.  There is 22 pH.  She was started on fluid and insulin drips, following  addition of glucose solution and potassium chloride.  Fluids and  electrolytes were monitored very closely every hour.   By next day of aggressive management, gap acidosis anion gap was closed.  The patient was started on NPH insulin additionally  to regular insulin.  By  the time of discharge, her condition was improved.  ___________ was normal.  Hyperglycemia was in the range of 118 to 218.  She was discharged with basic  recommendation to use insulin 70/30 with 26 units in the a.m. and 14 units  in p.m.   Problem 2:  ABDOMINAL PAIN:  In the setting of DKA, it was difficult to  distinguish for abdominal emergency.  However, on resolution of DKA, the  pain subsided.  Also it was found that the patient had UTI.  Urine culture  revealed Streptococcus agalactiae.   The patient was started on Zosyn IV following Augmentin 875 mg for three  following days.  CT of the abdomen was obtained which did not reveal any  acute findings and ruled out formation of cyst in the pancreas.   Problem 3:  BIPOLAR DISORDER:  The patient was followed by [facility].  She had an appointment with her doctor in nine days after the time  of discharge.   Problem 4:  POLYSUBSTANCE ABUSE:  The patient was found to be positive for  cocaine on admission.  She was given the number for NA.                                                Jonna Clark    DS/MEDQ  D:  01/06/2004  T:  01/08/2004  Job:  [id number]

## 2010-11-02 NOTE — Consult Note (Signed)
[place]. [facility]  Patient:    Mary [name], Mary [name]                       MRN: [id number] Proc. Date: 06/02/00 Adm. Date:  [id number] Attending:  Alfonso [name] Dictator:   Mary [name], M.D. CC:         Mary [name], M.D.                          Consultation Report  REASON FOR CONSULTATION:  Guaiac positive stool, abdominal pain.  ASSESSMENT: 1. Abdominal pain with nausea and vomiting. 2. Guaiac positive stool. 3. Anemia, microcytic. 4. History of pancreatitis - no calcifications, no elevated lipase (max 27 in    the past year)  CT of the abdomen on September 2001, with a normal    pancreas. 5. Polysubstance abuse, cocaine, alcohol, marijuana, barbituates. 6. Depression. 7. Unstable social situation. 8. Status post low transverse cesarean section. 9. Diabetes mellitus diet controlled.  PLAN: 1. ______. 2. Continue protonix.  DISCUSSION:  Mary [name] is a 40 year old black female with a past medical history as listed above, admitted for abdominal pain with nausea and vomiting.  The patient has a questionable history of pancreatitis with no elevation of lipase.  As noted above recent CT of the abdomen showed a normal pancreas, no calcifications, no history of an elevated lipase with a maximum of 27 in the past year.  The patient gives a history of reflux symptoms intermittently over the past year which are relieved with Pepcid.  She also describes her abdominal pain as mid epigastric which is worse in the morning.  However, it is not correlated with eating.  With a history of GI symptoms along with polysubstance abuse, the patient is at risk for gastritis or an ulcer which may be the etiology of her abdominal pain.  Also with Hemoccult positive stools will plan to evaluate both upper and lower endoscopy.  Dr. Loreta [name] has seen and evaluated the patient.  REVIEW OF SYSTEMS:  No hematemesis, melena, hematochezia, diarrhea, stearrhea. The patient is  currently menstruating.  No dysuria or hematuria.  No shortness of breath.  PAST MEDICAL HISTORY/PAST SURGICAL HISTORY:   See assessment.  SOCIAL HISTORY:  Patient lives in [place] with her boyfriend.  A history of alcohol use with approximately 2 packs per day of beer for 15 years.  She quit 1 year ago.  Positive crack cocaine and marijuana, barbituates.  FAMILY HISTORY:  Father died in his 57s with pneumonia and alcoholism.  Mother approximately 63 with diabetes.  CURRENT MEDICATIONS: 1. Protonix 40 mg IV q. day. 2. Morphine p.r.n. 3. Phenergan p.r.n.  ALLERGIES:  No known drug allergies.  PHYSICAL EXAMINATION:  VITAL SIGNS:  Temperature 97.6, pulse 73, respirations 20, blood pressure 130/80.  O2 saturations 98% on room air.  GENERAL:  disheveled black female in no acute distress.  Appears older than stated age.  HEENT:  Sclerae nonicteric.  NECK:  Supple.  No lymphadenopathy.  No thyromegaly.  CARDIOVASCULAR:  Regular rate.  No murmurs.  ABDOMEN:  Bowel sounds present.  Soft, mild to mid epigastric and right upper quadrant tenderness.  No hepatosplenomegaly.  EXTREMITIES:  No edema.  RECTAL:  Hematology positive per resident.  LABORATORY DATA:  Hemoglobin 11.1, MCV 72.8.  WBC 12.8, platelets 168. Amylase 222, lipase 20.  Comprehensive metabolic panel within normal limits except glucose 165, BUN 25. Ferritin 83.  Transferrin 290.  TIBC 276, iron 68.  Urine drug screen positive for barbituates, positive cocaine, positive opiates, positive marijuana.  Abdominal ultrasound from November 2001, normal gallbladder.  CT of November 2001, showed a normal pancreas. DD:  06/02/00 TD:  06/02/00 Job: [id number]

## 2010-11-02 NOTE — Op Note (Signed)
NAME:  Mary [name], Mary [name]                ACCOUNT NO.:  [id number]   MEDICAL RECORD NO.:  [id number]          PATIENT TYPE:  INP   LOCATION:  2899                         FACILITY:  [facility]   PHYSICIAN:  Vanita Panda. 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   POSTOPERATIVE DIAGNOSES:  Right bimalleolar ankle fracture malunion (21  weeks old).   PROCEDURE:  1. Right ankle distal fibula and medial malleolus osteotomies  2. Open reduction internal fixation of bimalleolar ankle fracture.  3. Allograft bone grafting to right medial malleolus using Grafton.   SURGEON:  Doneen Poisson, MaryD.   </ASSISTANT  Maud Deed, P.A.-C.   ANESTHESIA:  General.   ANTIBIOTICS:  1 gram IV Ancef.   BLOOD LOSS:  Minimal.   COMPLICATIONS:  None.   TOURNIQUET TIME:  1 hour 15 minutes.   INDICATIONS:  Briefly, Mary [name] is a 40 year old who is a poorly-  controlled diabetic and is noncompliant with her medical regimen.  She  has had history of alcohol and drug abuse and had sustained a  bimalleolar ankle fracture, for which I saw her in the emergency room.  I then set her up to see me in the office 2 days later, which she did  not show until 6 weeks later.  At that time I tried to set her up for  surgery, but due to her noncompliance and multiple medical issues,  anesthesia did not feel it was safe to allow surgery that time without  further medical clearance.  It has now been 12 weeks and she finally  obtained at that clearance.  A lot of this has been difficulty due to  her lack of transportation.   The risks and benefits of this were explained to her and well-understood  and she agreed to proceed with surgery.  My plan will be to provide  osteotomies of the fracture, due to the fact that this is basically in  a  malunion position with the ankle subluxed and I wanted to give her ankle  that would be better supportive, in light of her diabetes, to provide  her better support and less compromise of soft tissues.   PROCEDURE DESCRIPTION:  After informed consent was obtained, the  appropriate right leg was marked.  She was brought the operating room  and placed supine on the operating table.  General anesthesia was then  obtained.  Her leg was prepped and draped with Betadine scrub and paint  and then followed with DuraPrep.  A nonsterile tourniquet was placed  around her upper thigh on the right side.  Sterile drapes were applied  as well.  Prior to wrapping of the ankle, time-out was called  and she  was identified as the correct patient and correct extremity.  I then  wrapped out the ankle with an Esmarch, inflated the tourniquet to 300 mm  of pressure.   I first  made an incision on  the lateral side of the ankle and took this  down the fracture site.  The fracture was almost completely healed and I  used an osteotome to provide an osteotomy through the original fracture  site.  I then removed fibrinous tissue and calloused bone from this area  to a base where I could see that there would be good bleeding bone.  Prior to fixing the fibula, I went over to the medial side to free up  the medial malleolus.  Incision was made on medial side, just over the  level the medial malleolus fracture.  I then had to use osteotome and  rongeur to take down this fracture completely.  There was a significant  rotational component to the fracture and it was subluxed medially and I  had to make efforts to allow [place] to reduce it.  I did see damaged  cartilage of the talar dome and the distal tibia, as well.  Once the  medial malleolus piece was mobilized, I went back to the lateral side.  I was able to use a lag screw technique to lag the oblique fracture of  the distal fibula together and then used a 1/3  semitubular locking plate  as a buttress effect on the lateral side.  I then went back to the  medial side and tried to tease the fracture into a position that was as  anatomically reproducible as possible.  All this was done under direct  fluoroscopic guidance.  I then placed two 4.0 mm cannulated screws into  the medial malleolar piece to hold this into place and, under direct  fluoroscopy, this showed the ankle joint to at least be stable and not  subluxed at this point.   I then irrigated all wounds and placed supplemental bone with allograft  bone matrix from Grafton into the bone deficits on the medial side.  Following this the wounds were closed with interrupted 0 Vicryl suture  over all hardware, followed by 2-0 Vicryl in subcutaneous tissue and 2-0  nylon in an interrupted format in the skin.  Xeroform followed by a well-  padded sterile dressing were applied.  I then placed the patient back  into a plaster splint.  I will keep her nonweightbearing.  At the end of  the case, she was awakened, extubated and taken to the recovery room in  stable condition.  All final counts were correct.  There no  complications noted.           ______________________________  Vanita Panda. Magnus Ivan, MaryD.     CYB/MEDQ  D:  08/01/2006  T:  08/01/2006  Job:  [id number]

## 2010-11-02 NOTE — H&P (Signed)
NAME:  Mary [name], Mary [name]                          ACCOUNT NO.:  [id number]   MEDICAL RECORD NO.:  [id number]                   PATIENT TYPE:  EMS   LOCATION:  ED                                   [facility]ACILITY:  [facility]   PHYSICIAN:  Hollice Espy, M.D.            DATE OF BIRTH:  Oct 25, 1970   DATE OF ADMISSION:  06/14/2003  DATE OF DISCHARGE:                                HISTORY & PHYSICAL   PRIMARY CARE DOCTOR:  [facility].   CHIEF COMPLAINT:  High blood sugars and nausea and vomiting.   This is a 40 year old African-American female with past medical history of  polysubstance abuse, diabetes mellitus type 2, and bipolar disorder who  presents with a two-day history of nausea and vomiting.  Patient states that  previously before that she was well although she has been noncompliant in  regards to her diabetes.  She rarely checks her sugars and they are usually  very high and she does not follow a regular diet.  She states that for the  last two days she has had severe nausea and vomiting, unable to keep  anything down including food, drink, and her medicines.  She takes Glipizide  for her diabetes.  Finally checked her blood sugar today and it was reported  as high so she came into the ER, continued to have pain in her abdomen and  nausea and vomiting.  In the ER, her blood sugar was noted to be 495.  She  was admitted, started on IV fluids, and given 10 units of subcu. regular  insulin.  Three hours later her blood sugar was 322.  Currently patient  states that she is having some pain, mostly in her abdomen, but sort of all  over.  She feels very fatigued.  She denies any headaches and she also  denies any chest pain or shortness of breath.  She denies any extremity pain  or focal weakness.  She denies any hematuria, dysuria, constipation, or  diarrhea.  She denies any fever.  She denies any recent illness contacts.   PAST  MEDICAL HISTORY:  1. Polysubstance abuse.  2. Diabetes type 2.  3. Bipolar disorder.   MEDICATIONS CURRENTLY:  1. Depakote 100 b.i.d.  2. Glipizide 10 mg b.i.d.  3. She is on Seroquel which reportedly is p.r.n., I am not sure if this is     correct.   ALLERGIES:  She has no known drug allergies.   SOCIAL HISTORY:  She denies any current drug use or alcohol abuse.  She  occasionally smokes.   FAMILY HISTORY:  Noncontributory.   PHYSICAL EXAMINATION:  VITAL SIGNS:  On admission blood pressure 140/50  currently, heart pulse 67, O2 saturation 100% on room air, she is afebrile.  GENERAL:  She appears dehydrated, her mucous membranes are dry, but  otherwise she is alert and oriented  and lethargic but easily arousable.  HEENT:  Normocephalic, atraumatic.  She had no carotid bruits.  HEART:  Regular rate and rhythm.  S1 and S2.  LUNGS:  Clear to auscultation bilaterally.  ABDOMEN:  Soft.  No focal tenderness.  She has decreased bowel sounds.  EXTREMITIES:  No cyanosis, clubbing or edema.   LABS:  Her white count is 13.7, H&H 12.9 and 40.7, noted MCV of 72, platelet  count 212.  Sodium 138, potassium 4.1, chloride 105, bicarb 22, BUN 17,  creatinine 0.9, glucose 440, calcium 10.3, total protein 8.3.  LFTs are all  within normal limits.  Her urine pregnancy test is negative.  She has a very  small amount of serum acetone.  Her UA shows greater than 1000 glucose and  greater than 80 ketones, leukocyte esterase is negative, and she has only 0-  2 white cells.  Her anion gap is 11.   ASSESSMENT/PLAN:  1. Hyperglycemia.  Patient is a type 2 with no gap, therefore this is not     diabetic ketoacidosis.  Continue nothing by mouth (NPO), intravenous     fluids and regular insulin.  Once her blood glucoses are below 200, start     by mouth plus a sliding scale.  2. Bipolar disorder.  Continue Depakote.  Question if the patient is on     Seroquel as needed.  3. History of polysubstance abuse.   Will check a urine drug screen.                                               Hollice Espy, M.D.    SKK/MEDQ  D:  06/14/2003  T:  06/14/2003  Job:  [id number]

## 2010-11-02 NOTE — Discharge Summary (Signed)
[place]. [facility]  Patient:    Mary [name]                        MRN: [id number] Adm. Date:  [id number] Disc. Date: [id number] Attending:  Miguel Aschoff CC:         [facility]                           Discharge Summary  DATE OF BIRTH:  January 09, 1971  CONSULTATIONS:  None.  PROCEDURES:  None.  DISCHARGE DIAGNOSES: 1. Abdominal pain, nausea, and vomiting of unclear etiology, resolved. 2. Anemia, hemoglobin 12.1 on admission, 10.8 nadir, 11.4 at discharge without    intervention, MCV 71.6 with RDW of 14, stool Hemoccult-positive. 3. Substance abuse history (marijuana and cocaine positive on admission, alcohol    and tobacco). 4. History of type 2 diabetes mellitus diagnosed in 1998, diet-controlled, with  normal hemoglobin A1C this admission. 5. History of cesarean section.  FOLLOW-UP:  The patient is scheduled at [facility] Tuesday, June 19, 1999.  DISCHARGE MEDICATIONS:  Tylenol p.r.n.  DISCHARGE INSTRUCTIONS:  The patient is to avoid concentrated sweets.  She was counseled regarding cessation and drugs, but is in significant denial and declines treatment.  HISTORY OF PRESENT ILLNESS:  The patient is a 40 year old sexually-active woman who presented to the [facility] complaining of abdominal pain, nausea, vomiting. Workup there included urine, CBG, and urine pregnancy (negative), and the patient was transferred here for further evaluation as she was not pregnant and not requiring the services at that facility.  Her symptoms began on the day of admission with vomiting.  She had not had a bowel movement for a few days, which is not unusual for her.  Her last menstrual period was a month ago, with current period active and on time.  She uses no birth control.  The pain had been "all over" and nonradiating, without dysuria or vaginal discharge.  She denied drug se despite the positive urine drug  screen in the emergency room.  She described a similar admission recently, but was not sure of the ultimate diagnosis. Generally, she was quite reticent about talking, and most uncooperative.  PHYSICAL EXAMINATION:  VITAL SIGNS:  Normal temperature with normal vital signs.  GENERAL:  She was disheveled, lying in the fetal position, angry about having to be reexamined.  HEENT:  Her mucous membranes were moist.  LUNGS:  Clear.  HEART:  Regular rate and rhythm.  ABDOMEN:  Soft and diffusely tender but, perhaps, more in the upper quadrants.  Bowel sounds were present although diminished.  There was no guarding.  An abdominal bruit was noted.  There was no CVAT.  PELVIC:  Performed by EDP showed some cervical motion tenderness.  Cultures were submitted.  SKIN:  Skin turgor was normal.  EXTREMITIES:  Joints were without inflammation.  ADMISSION LABORATORY DATA:  Amylase 346, lipase 18.  Urine drug screen positive for cocaine and cannabinoids.  Wet prep negative except for a few clue cells. Urinalysis revealed specific gravity 1.030, 0-5 white cells, 6-10 red cells. Sodium 138, potassium 3.7, chloride 99, CO2 30, glucose 139, BUN 18, creatinine  0.9, calcium 9.7, albumin 3.8.  SGOT 42, SGPT 16, alkaline phosphatase 84, total bilirubin 0.4.  Hemoglobin 12.1, MCV 73.2, RDW 14.4, WBC 15.1, with absolute neutrophil count 12.6.  Acute abdominal series revealed no acute findings.  Incidental finding of  thoracolumbar scoliosis was noted.  Ms. Clingenpeel was admitted with an unclear etiology underlying her symptoms.  Empiric  antibiotics were begun to cover possible PID while awaiting cultures.  These were ultimately negative.  HOSPITAL COURSE:  Ms. Kitamura was treated with symptomatic therapy.  Follow-up hemoglobin was 10.8, white count 12.9.  IV Pepcid was continued for the possibility of peptic ulcer disease or gastritis.  The possibility of cocaine-induced intestinal ischemia was  considered.  Symptoms did not improve with these therapies, and abdominal CT was ordered.  There was focal fatty infiltration of the liver ut, otherwise, unremarkable.  A small amount of free pelvic fluid was noted, but no  pelvic masses nor inflammatory processes.  Gallbladder, spleen, pancreas, adrenal glands, and kidneys are normal.  With time, Ms. Stankiewicz had resolution of her symptoms.  She was able to eat without  difficulty.  Hemoglobin at discharge was 11.4, not far from her baseline.  I presume the microcytosis is secondary to menstrual blood loss, although the presence of a heme-positive stool on one occasion suggests gastritis.  Cocaine-related vasospasm and gastroenteritis were both entertained as possibilities.  Again, she denied difficulty with substance abuse and was not open to rehabilitation options.  Note, no evidence of diabetes mellitus this admission.  Hemoglobin A1C was normal, as was glycosylated hemoglobin at 5.3.  She may have some degree of glucose intolerance in stressful situations, but I would not label her a diabetic. DD:  08/27/99 TD:  08/28/99 Job: [id number]
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
## 2010-11-02 NOTE — Discharge Summary (Signed)
NAME:  Mary [name], Mary [name]                          ACCOUNT NO.:  [id number]   MEDICAL RECORD NO.:  [id number]                   PATIENT TYPE:  INP   LOCATION:  0357                                 FACILITY:  [facility]   PHYSICIAN:  Hollice Espy, M.D.            DATE OF BIRTH:  [birth year]   DATE OF ADMISSION:  06/14/2003  DATE OF DISCHARGE:  06/16/2003                                 DISCHARGE SUMMARY   DISCHARGE DIAGNOSES:  1. Hyperglycemia.  2. Diabetes mellitus with medical noncompliance.  3. Bipolar disorder.  4. Dehydration secondary to #1.   DISCHARGE MEDICATIONS:  1. Glucophage 500 mg p.o. b.i.d. (which is a new medication).  2. Glipizide 10 mg p.o. b.i.d. (previous medication).  3. Depakote b.i.d. (previous medication).  4. Seroquel p.r.n. (previous medication).   HISTORY OF PRESENT ILLNESS:  This is a 40 year old white female who is  followed for diabetes at the [facility], and has been  noncompliant with her medications.  She states that her sugars are usually  very high in the 300's and 400's.  She presented after having several days  of nausea, vomiting, being unable to take any medications.  When she first  came in, her sugars were in the 600's.  However, her lytes were checked, and  she had no elevated anion gap.  It was felt that this was a hyperglycemia,  non-ketotic state.  She was given multiple doses of subcutaneous insulin,  and her blood sugars came down into the 100's.  She was given IV fluids for  rehydration, and she was given a clear diet.  She tolerated this well, and  this was advanced to a full diet last night, and by the morning of June 16, 2003, she was able to tolerated a regular diet.  Her A1C was checked and  this was found to be 11.3.  Upon resumption of a liquid diet, her blood  sugar was slightly elevated to the 200's.  She was started on her previous  dose of glipizide, 10 b.i.d., but in addition, she also was added  Glucophage  500 b.i.d., and she tolerated this, as well.  The patient was felt to be  medically stable for discharge.  Her bipolar disorder was treated.  Will  continue with her current dose of Depakote, which she tolerated well, and  she had no acute episodes.  She is felt to be medically stable.  She is  being discharged to home.   CONDITION ON DISCHARGE:  Improved.   DISCHARGE MEDICATIONS:  1. She was given a prescription for her Glucophage.  2. She was given a prescription for her glipizide.   FOLLOW UP:  She is to follow up with the family practice clinic in 1-2  weeks.   DIET:  She is recommended and discharged on a diabetic diet, and her  prognosis is dependent upon her compliance.  Hollice Espy, M.D.    SKK/MEDQ  D:  06/16/2003  T:  06/16/2003  Job:  [id number]   cc:   [facility]  [facility]

## 2010-11-02 NOTE — Op Note (Signed)
[place]. [facility]  Patient:    Mary [name], Mary [name]                       MRN: [id number] Proc. Date: 06/03/00 Adm. Date:  [id number] Disc. Date: [id number] Attending:  Alfonso Ramus CC:         Dineen Kid. Reche Dixon, M.D.   Operative Report  DATE OF BIRTH:  February 25, 1961  REFERRING PHYSICIAN:  Dineen Kid. Reche Dixon, M.D.  PROCEDURE PERFORMED:  Colonoscopy.  ENDOSCOPIST:  Anselmo Rod, M.D.  INSTRUMENT USED:  Olympus video colonoscope.  INDICATIONS FOR PROCEDURE:  Anemia with guaiac positive stools in a 40 year old African-American female with a history of alcohol and drug abuse, rule out colonic polyps, masses, hemorrhoids, etc.  PREPROCEDURE PREPARATION:  Informed consent was procured from the patient. The patient was fasted for eight hours prior to the procedure and prepped with a bottle of magnesium citrate and a gallon of NuLytely the night prior to the procedure.  PREPROCEDURE PHYSICAL:  The patient had stable vital signs.  Neck supple. Chest clear to auscultation.  S1, S2 regular.  Abdomen soft with normal abdominal bowel sounds.  DESCRIPTION OF PROCEDURE:  The patient was placed in the left lateral decubitus position and sedated with 50 mg of Demerol and 5 mg of Versed intravenously.  Once the patient was adequately sedated and maintained on low-flow oxygen and continuous cardiac monitoring, the Olympus video colonoscope was advanced from the rectum to the cecum without difficulty.  The entire colonic mucosa appeared healthy with normal vascular pattern, no erosions, ulcerations, masses or polyps were seen.  IMPRESSION:  Normal colonoscopy.  RECOMMENDATIONS:  A small bowel follow-through can be done to complete the patients gastrointestinal evaluation in regard to guaiac positive stools and anemia.  Further recommendation to be made by Dr. Encarnacion Slates service.DD: 06/03/00 TD:  06/04/00 Job: [id number]

## 2010-11-02 NOTE — Discharge Summary (Signed)
[place]. [facility]  Patient:    Mary [name], Mary [name]                       MRN: [id number] Adm. Date:  [id number] Disc. Date: [id number] Attending:  Levy Sjogren Dictator:   Rennis Petty, M.D. CC:         Rennis Petty, M.D.                           Discharge Summary  DISCHARGE DIAGNOSES: 1. Abdominal pain secondary to uncertain etiology/nausea/vomiting. 2. Polysubstance abuse, last cocaine use prior to onset of abdominal pain,    alcoholism/tobacco.  No intravenous drug abuse per patient. 3. Anemia.  Baseline hemoglobin 11.4, Hemoccult-positive.  Question    secondary to erosive gastritis.  DISCHARGE MEDICATIONS: 1. Protonix 40 mg p.o. q.d. 2. Phenergan 12.5 mg p.o. q.4h. p.r.n.  FOLLOW-UP:  As needed.  HISTORY OF PRESENT ILLNESS:  [age]-year-old female with past medical history positive for polysubstance abuse, was brought to the ED from motel on [address].  She presented with abdominal pain epigastric as well as intense nausea and bilious vomiting.  No hematemesis, diarrhea, dysuria.  She states it began after smoking rock of cocaine.  The patient is extremely resistant about talking and cooperating.  No fever and chills.  ______ , bowel habits, loss of appetite prior to this a.m.  She is currently on no medications.  She had similar attacks similar to this multiple times in the last couple of years, last hospitalization was December 2000, and this pain resolved on its own.  PHYSICAL EXAMINATION:  Withdrawn female wretching and vomiting.  On rectal, trace heme-positive, epigastric tenderness.  Pelvic exam done by the ED P.A. revealed nontender, minimal discharge, clue cells positive many, Trichomonas none.  LABORATORY DATA:  Hemoglobin 12.9, white count 17.7.  Amylase 109, lipase 21. HIV nonreactive.  LFTs within normal limits.  UA negative.  UDS positive for marijuana and cocaine.  Mary [name] is a 40 year old female with history of  polysubstance abuse and multiple admissions.  HOSPITAL COURSE: #1 - ABDOMINAL PAIN:  The patient continued to have wretching and vomiting, therefore, NG tube was placed x 24 hours for relief.  The patient was started on Protonix, otherwise was kept n.p.o., and was evaluated with acute abdominal series which was negative.  She also had a CT head to rule out intracerebral hemorrhage.  It was negative as well.  On evaluation, we considered getting a GI workup in order to evaluate if she needed upper GI endoscopy for presumed gastritis.  Dr. Marina Goodell did see the patient and recommended H. pylori antibody which was currently pending and treatment for that if positive.  We also counseled the patient multiple times on polysubstance abuse.  To me she did say that she needs to stop; however, does not want any help at this particular time.  After two days of inpatient therapy, the patient was feeling better. Therefore, today she will be discharged to her husband/boyfriend in stable condition. DD:  10/31/99 TD:  11/01/99 Job: [id number]

## 2010-11-02 NOTE — Discharge Summary (Signed)
[place]. [facility]  Patient:    Mary [name], Mary [name]                       MRN: [id number] Adm. Date:  [id number] Disc. Date: 04/30/00 Attending:  Anastasio Auerbach CC:         [facility] - New patient   Discharge Summary  DATE OF BIRTH:  01/07/1971  DISCHARGE DIAGNOSES: 1. Acute pancreatitis.    A. History of previous episode.    B. Likely alcohol related.    C. No evidence of gallstones by ultrasound. 2. Intractable nausea and vomiting. 3. Diarrhea. 4. Hypokalemia. 5. History of diabetes mellitus.    A. Sugars normal this admission.    B. Status post weight loss. 6. Polysubstance abuse:  Urine drug screen positive for cocaine, cannabinoids,    opiate, barbiturates. 7. Status post C-section. 8. Ongoing tobacco use. 9. No known drug allergies.  DISCHARGE MEDICATIONS: 1. Vicodin 5/500 one to two q.4h. p.r.n. pain (maximum eight per day) -    dispensED #20, no refills. 2. Pepcid 20 mg p.o. b.i.d. x 5 days.  Note:  [facility] will be supplying the above medications.  CONDITION UPON DISCHARGE:  Stable.  Tolerating regular low-fat diet.  No nausea/vomiting.  Pain control with oral medications.  DISCHARGE INSTRUCTIONS:  Recommended activity:  As tolerated.  Diet:  Eat small frequent low-fat meals.  Drink plenty of water.  SPECIAL INSTRUCTIONS: 1. Check a morning fasting sugar once a week.  Follow up with [facility] with    these records. 2. Avoid all alcohol-containing beverages. 3. No drugs. 4. Try to quit smoking.  FOLLOWUP:  The patient was instructed to call [facility] at [phone] to set up an eligibility appointment so she can have regular medical follow-up.  CONSULTATIONS:  None.  PROCEDURES: 1. Acute abdominal films (November 11):  Negative.  No pancreatic    calcifications.  No dilated loops of bowel.  No obstruction or free air. 2. Abdominal ultrasound (November 12):  Normal-appearing gallbladder  and    normal caliber common bile duct.  Slight decreased echogenicity of the    pancreatic head and body region compared to the tail.  May suggest edema or    pancreatitis.  HOSPITAL COURSE: #1 - ACUTE PANCREATITIS:  Mary [name] is a 40 year old African-American female with a history of heavy alcohol use and polysubstance abuse who presents with upper abdominal pain and an elevated amylase.  She has a history of at least two previous admissions over the past year for abdominal pain.  Abdominal ultrasound did reveal probably inflammation of the pancreas.  She was placed on IV fluids and kept n.p.o. and treated with IV Demerol and Phenergan.  By day #2, I was able to stop her IV pain medications and advance her to full liquid diet.  On the day of discharge she was tolerating a regular low-fat diet and was adequately rehydrated.  Her last drink was approximately two weeks ago, and although she does not drink heavily now she used to in the past.  I told her to avoid all alcoholic beverages and did advise her that recurrent episodes of pancreatitis can lead to chronic pain.  I will also use Pepcid twice a day for five days as she has been having symptomatic heartburn.  #2 - POLYSUBSTANCE ABUSE:  The case manager and myself talked with her about the importance of avoidance of alcohol and drugs.  She was given  numbers to ADS.  #3 - DIABETES MELLITUS:  Mary [name] apparently had a history of diabetes.  She has lost a significant amount of weight, and it appears that her sugars have normalized.  I did advise her to check her sugars once a week in the morning and to bring her records to [facility].  She likely has a predisposition to diabetes and needs to be followed closely.  At this point she requires no medications.  #4 - HYPOKALEMIA:  Total body depleted secondary to chronic drug and alcohol use.  Depletion also secondary to dehydration and no vomiting.  #5 - THROMBOCYTOPENIA:  Platelets were  124, and I suspect this was related to polysubstance abuse.  No evidence of bleeding.  DISCHARGE LABORATORIES:  Sodium 135, potassium 3.4 (I gave her 20 mEq prior to discharge), chloride 105, bicarb 26, BUN 8, creatinine 0.7, glucose 89. Hemoglobin A1C 5.3.  Hemoglobin 10.2, WBC 11,200, platelet count 124.  Urine pregnancy negative. DD:  04/30/00 TD:  04/30/00 Job: [id number]

## 2010-11-02 NOTE — Discharge Summary (Signed)
[place]. [facility]  Patient:    Mary [name], Mary [name]                       MRN: [id number] Adm. Date:  [id number] Disc. Date: [id number] Attending:  Phifer, Harriett Sine Welcome Dictator:   Marisue Brooklyn, M.D.                           Discharge Summary  DISCHARGE DIAGNOSES: 1. Abdominal pain secondary to cocaine induced bowel ischemia. 2. Polysubstance abuse with crack cocaine, tobacco, marijuana, barbiturates    and alcohol.  DISCHARGE MEDICATIONS: 1. Protonix 40 mg one p.o. q.d. 2. Prozac 20 mg one p.o. q.d. 3. Darvocet-N 100 one p.o. q.4h. p.r.n. pain.  CONDITION ON DISCHARGE:  Stable.  SPECIAL INSTRUCTIONS:  She is instructed to abstain from alcohol, barbiturates, cocaine, marijuana and tobacco.  She has been given the number of narcotics anonymous at [phone], for rehabilitation.  FOLLOWUP:  She is to follow up with [facility], her regular primary care service.  HISTORY OF PRESENT ILLNESS:  This is a 40 year old, uncooperative, African-American female who presented with a two-day history of progressively worsening diffuse abdominal pain, nausea and vomiting.  Abdominal pain was mostly epigastric radiating to back preventing food intake.  She denied fevers or chills.  She complained of food particles in the vomitus which was nonbilious and nonbloody with no melanotic stools.  She does have a history of melanotic stools in the past.  REVIEW OF SYSTEMS:  Positive for headaches, poor appetite and weight loss.  PAST MEDICAL HISTORY: 1. History of multiple admissions for nausea, vomiting, abdominal pain with a    negative CT of the abdomen in September 2001, and a negative CT of the    abdomen today on December 11, 2000. 2. Ultrasound of the abdomen was negative in November 2001. 3. History of alcoholic pancreatitis, multiple episodes. 4. Polysubstance abuse with crack cocaine, tobacco, marijuana, barbiturates,    alcohol in which she takes two packs of  beer a week for the past 15 years. 5. History of heme positive stools and was worked up with EGD in December    2001, with mild hiatal hernia, mild antral gastritis, history of H. pylori    negative in March 2000. 6. Colonoscopy in December 2001, was normal per Dr. Loreta Ave. 7. Status post cesarean section. 8. Diet controlled diabetes type 2.  Hemoglobin A1C was 5.3 in March 2001.    She was diagnosed with diabetes in 1998. 9. Baseline hemoglobin is approximately 11.1.  MEDICATIONS: 1. Prozac 20 mg q.d. 2. Protonix 40 mg q.d.  ALLERGIES:  No known drug allergies.  SOCIAL HISTORY:  She lives with a boyfriend in [place] in [place].  She does to [facility] at [phone].  FAMILY HISTORY:  Dad deceased at 68 with pneumonia and alcoholism.  Mom is alive in 28s with diabetes.  PHYSICAL EXAMINATION:  VITAL SIGNS:  Temperature 99.3, heart rate 55, blood pressure 122/64, respiratory rate 18, oxygen saturations 97%.  ABDOMEN:  Soft, nondistended, scaphoid abdomen with epigastric tenderness on the right and bilateral costovertebral angle tenderness, otherwise negative.  LABORATORY DATA AND X-RAY FINDINGS:  White count 10.4, ANC 8.5, hemoglobin 13, hematocrit 41, MCV 75, platelets 168.  Sodium 140, potassium 3.7, chloride 104, bicarb 27, BUN 23, creatinine 0.8 and glucose 94, calcium 9.3.  AST 25, ALT 18, bicarb.  Alk phos 16, total bilirubin 0.8, protein 7.3, albumin 3.8, lipase 20.  Urine pregnancy negative.  Urine drug screen positive for cocaine, barbiturates.  UA was amber colored and cloudy with 1.03 specific gravity, small bilirubin, 30 ketones, 30 proteins, 2 urobilinogens, nitrites negative, small LE, few bacteria.  EKG showed a normal sinus rhythm at 58 beats per minute with bradycardia.  CT of the abdomen and pelvis showed a negative abdominal CT with bilateral ovarian cysts 3 cm on the right and 2 cm on the left with a small amount of free fluid.  HOSPITAL COURSE:  #1 - ABDOMINAL  PAIN:  This is likely secondary to cocaine induced ischemia as no other causes were identifiable and she has had a negative complete work-up in the past.  She was kept NPO and given IV fluids. Her pain was mildly better.  Exam did not reveal significant tenderness and she was able to tolerate p.o. well without vomiting.  She is being discharged home in stable condition and advised to take Protonix.  #2 - SUBSTANCE ABUSE:  This is likely the cause of all her health problems. She is unwilling to do anything definite for her life.  However, we have given her the number of narcotics anonymous and hopefully she will follow up with that.  #3 - HISTORY OF MELANOTIC STOOLS:  Complete work-up done in the past does not warrant any present work-up.  She did not permit [place] to perform a rectal exam and was very uncooperative.  #4 - QUESTIONABLE PELVIC INFLAMMATORY DISEASE:  We have a checked a urine for GC and chlamydia which is pending at this time.  #5 - QUESTIONABLE HISTORY OF DIABETES:  Most of her sugars were within stable limits at this admission.  DISCHARGE PHYSICAL EXAMINATION:  Vital signs with temperature 97.3, heart rate 57, blood pressure 180/95, respiratory rate 16, oxygen saturations 100% on room air.  DISCHARGE LABORATORY DATA AND X-RAY FINDINGS:  White count 8.6, hemoglobin 10.6, hematocrit 32, MCV 73, platelets 123.  Sodium 144, potassium 3.7, chloride 110, bicarb 27, BUN 16, creatinine 0.8, glucose 143 and calcium 9.0. At this time, GC, chlamydia and urine are pending. DD:  12/12/00 TD:  12/12/00 Job: [id number]

## 2010-11-02 NOTE — Consult Note (Signed)
NAME:  Mary [name], Mary [name]                ACCOUNT NO.:  [id number]   MEDICAL RECORD NO.:  [id number]          PATIENT TYPE:  EMS   LOCATION:  MAJO                         FACILITY:  [facility]   PHYSICIAN:  Vanita Panda. Magnus Ivan, M.D.DATE OF BIRTH:  1970-12-10   DATE OF CONSULTATION:  DATE OF DISCHARGE:                                   CONSULTATION   HISTORY OF PRESENT ILLNESS:  Briefly, Mary [name] is a 40 year old female who  reports using alcohol today.  She is also diabetic and was inebriated and  fell approximately 20 to 25 feet off of some railing she was sitting on.  She was brought by EMS to [facility] with a silver trauma code.  She was  screaming, complaining of right leg pain.  She denies other injuries, but  again did use alcohol today.  She is in a cervical collar as well.   PAST MEDICAL HISTORY:  She is a poorly controlled diabetic and she reports a  history of bleeding ulcers before as well as anemia and history of bipolar  disease.   ALLERGIES:  She denies any allergies.   MEDICATIONS:  No medications are known.   SOCIAL HISTORY:  She lives, she states, with her sister-in-law.  She smokes  about 5 cigarettes daily and does have a history of alcohol abuse.  She  denies any drug use.   PHYSICAL EXAMINATION:  Vital signs:  Heart rate 67.  Blood pressure 98/66.  Respiratory rate 13.  O2 sat 98%.  In general, she is an alert, oriented  female in obvious discomfort, but no acute distress and easily consolable.  Her neck has a C-collar in place, but she is nontender to palpation along  the midline.  She has full range of motion without pain when she moved her  head, even though I instructed her not to.  She has no obvious step off and  I put the collar back on due to her alcohol use.  Pelvis is stable AP and  lateral.   IMPRESSION:  In general to her extremities, she has no obvious deformities  of her bilateral upper or left lower extremities.  She is moving these  easily.  The  right ankle does show bruising and swelling with pain with pain  medially and laterally.  Clinically, it appears to be well located and she  has palpable pulses in her foot, but subjective decrease sensation, likely  secondary to her poorly controlled diabetes.  She has 1+ dorsalis pedis and  posterior tibial pulses in the ankle is clinically located.   X-RAYS:  The right ankle x-rays are obtained and show a trimalleolar  fracture of the ankle.  The mortise or alignment of the ankle is well  located and the posterior malleolus fractures are less than 25% of the  articular surface.  The tibia is broken at the level of the mortis and is a  Webber B ankle fracture.   IMPRESSION:  This is a 40 year old female, diabetic with a right  trimalleolar ankle fracture status post fall from approximately 20 feet.   PLAN:  From an orthopedic standpoint, she is too swollen to proceed with any  type of surgical intervention and since the ankle is well  located, I am going to put her in a plaster splint with a posterior piece  for stirrups.  She will be instructed on restricted non-weight bearing  approaches as well as elevation and ice.  Follow up appointment should be  established  in my office this coming Monday, which is just in 2 to 3 days for continued  evaluation, so we can set her up for surgery as an outpatient.  I have  explained at length that she will need plate and screw construct with  nonweightbearing for anywhere from 6 to 8 weeks, depending on how the  fracture does.           ______________________________  Vanita Panda. Magnus Ivan, M.D.     CYB/MEDQ  D:  04/18/2006  T:  04/19/2006  Job:  [id number]

## 2011-01-15 ENCOUNTER — Emergency Department (HOSPITAL_COMMUNITY)
Admission: EM | Admit: 2011-01-15 | Discharge: 2011-01-15 | Disposition: A | Discharge status: Home / Self Care | Payer: Medicaid Other | Attending: Emergency Medicine | Admitting: Emergency Medicine

## 2011-01-15 DIAGNOSIS — Z8619 Personal history of other infectious and parasitic diseases: Secondary | ICD-10-CM | POA: Insufficient documentation

## 2011-01-15 DIAGNOSIS — R109 Unspecified abdominal pain: Secondary | ICD-10-CM | POA: Diagnosis present

## 2011-01-15 DIAGNOSIS — E119 Type 2 diabetes mellitus without complications: Secondary | ICD-10-CM | POA: Insufficient documentation

## 2011-01-15 DIAGNOSIS — N739 Female pelvic inflammatory disease, unspecified: Secondary | ICD-10-CM | POA: Diagnosis not present

## 2011-01-15 DIAGNOSIS — E871 Hypo-osmolality and hyponatremia: Secondary | ICD-10-CM | POA: Insufficient documentation

## 2011-01-15 LAB — WET PREP, GENITAL: Trich, Wet Prep: NONE SEEN

## 2011-01-15 LAB — DIFFERENTIAL
Basophils Absolute: 0 10*3/uL (ref 0.0–0.1)
Basophils Relative: 0 % (ref 0–1)
Eosinophils Relative: 3 % (ref 0–5)
Monocytes Absolute: 0.6 10*3/uL (ref 0.1–1.0)
Neutro Abs: 5.2 10*3/uL (ref 1.7–7.7)

## 2011-01-15 LAB — URINALYSIS, ROUTINE W REFLEX MICROSCOPIC
Leukocytes, UA: NEGATIVE
Nitrite: NEGATIVE
Protein, ur: NEGATIVE mg/dL
Urobilinogen, UA: 0.2 mg/dL (ref 0.0–1.0)

## 2011-01-15 LAB — COMPREHENSIVE METABOLIC PANEL
ALT: 62 U/L — ABNORMAL HIGH (ref 0–35)
BUN: 16 mg/dL (ref 6–23)
CO2: 23 mEq/L (ref 19–32)
Calcium: 10.2 mg/dL (ref 8.4–10.5)
Creatinine, Ser: 0.73 mg/dL (ref 0.50–1.10)
GFR calc Af Amer: >60 mL/min (ref >60)
GFR calc non Af Amer: >60 mL/min (ref >60)
Glucose, Bld: 652 mg/dL (ref 70–99)

## 2011-01-15 LAB — CBC
MCHC: 31.8 g/dL (ref 30.0–36.0)
RDW: 15.6 % — ABNORMAL HIGH (ref 11.5–15.5)

## 2011-01-15 LAB — POCT PREGNANCY, URINE: Preg Test, Ur: NEGATIVE

## 2011-01-15 LAB — GLUCOSE, CAPILLARY: Glucose-Capillary: 493 mg/dL — ABNORMAL HIGH (ref 70–99)

## 2011-01-15 LAB — URINE MICROSCOPIC-ADD ON

## 2011-01-15 MED ORDER — ONDANSETRON HCL 4 MG/2ML IJ SOLN
INTRAMUSCULAR | Status: AC
  Filled 2011-01-15: qty 2

## 2011-01-15 MED ORDER — MORPHINE SULFATE 2 MG/ML IJ SOLN
INTRAMUSCULAR | Status: AC
  Filled 2011-01-15: qty 2

## 2011-01-16 LAB — RPR: RPR Ser Ql: NONREACTIVE

## 2011-03-01 ENCOUNTER — Emergency Department (HOSPITAL_COMMUNITY)
Admission: EM | Admit: 2011-03-01 | Discharge: 2011-03-01 | Disposition: A | Discharge status: Home / Self Care | Payer: Medicaid Other | Attending: Emergency Medicine | Admitting: Emergency Medicine

## 2011-03-01 ENCOUNTER — Emergency Department (HOSPITAL_COMMUNITY): Payer: Medicaid Other

## 2011-03-01 DIAGNOSIS — R05 Cough: Secondary | ICD-10-CM | POA: Diagnosis not present

## 2011-03-01 DIAGNOSIS — G40909 Epilepsy, unspecified, not intractable, without status epilepticus: Secondary | ICD-10-CM | POA: Insufficient documentation

## 2011-03-01 DIAGNOSIS — K2990 Gastroduodenitis, unspecified, without bleeding: Secondary | ICD-10-CM | POA: Diagnosis not present

## 2011-03-01 DIAGNOSIS — E119 Type 2 diabetes mellitus without complications: Secondary | ICD-10-CM | POA: Diagnosis not present

## 2011-03-01 DIAGNOSIS — F319 Bipolar disorder, unspecified: Secondary | ICD-10-CM | POA: Insufficient documentation

## 2011-03-01 DIAGNOSIS — R112 Nausea with vomiting, unspecified: Secondary | ICD-10-CM | POA: Insufficient documentation

## 2011-03-01 DIAGNOSIS — R300 Dysuria: Secondary | ICD-10-CM | POA: Diagnosis not present

## 2011-03-01 DIAGNOSIS — K219 Gastro-esophageal reflux disease without esophagitis: Secondary | ICD-10-CM | POA: Diagnosis not present

## 2011-03-01 DIAGNOSIS — Z794 Long term (current) use of insulin: Secondary | ICD-10-CM | POA: Insufficient documentation

## 2011-03-01 DIAGNOSIS — K2970 Gastritis, unspecified, without bleeding: Secondary | ICD-10-CM | POA: Insufficient documentation

## 2011-03-01 DIAGNOSIS — R059 Cough, unspecified: Secondary | ICD-10-CM | POA: Insufficient documentation

## 2011-03-01 DIAGNOSIS — R109 Unspecified abdominal pain: Secondary | ICD-10-CM | POA: Insufficient documentation

## 2011-03-01 DIAGNOSIS — R1013 Epigastric pain: Secondary | ICD-10-CM | POA: Diagnosis present

## 2011-03-01 DIAGNOSIS — Z8619 Personal history of other infectious and parasitic diseases: Secondary | ICD-10-CM | POA: Insufficient documentation

## 2011-03-01 LAB — LIPASE, BLOOD: Lipase: 30 U/L (ref 11–59)

## 2011-03-01 LAB — DIFFERENTIAL
Basophils Relative: 0 % (ref 0–1)
Eosinophils Absolute: 0.2 10*3/uL (ref 0.0–0.7)
Eosinophils Relative: 2 % (ref 0–5)
Lymphs Abs: 4.5 10*3/uL — ABNORMAL HIGH (ref 0.7–4.0)
Monocytes Absolute: 0.5 10*3/uL (ref 0.1–1.0)
Neutrophils Relative %: 49 % (ref 43–77)

## 2011-03-01 LAB — POCT I-STAT 3, VENOUS BLOOD GAS (G3P V)
O2 Saturation: 84 %
pCO2, Ven: 35 mmHg — ABNORMAL LOW (ref 45.0–50.0)

## 2011-03-01 LAB — COMPREHENSIVE METABOLIC PANEL
AST: 60 U/L — ABNORMAL HIGH (ref 0–37)
Albumin: 3.7 g/dL (ref 3.5–5.2)
BUN: 13 mg/dL (ref 6–23)
Calcium: 10.4 mg/dL (ref 8.4–10.5)
Creatinine, Ser: 0.69 mg/dL (ref 0.50–1.10)
Total Bilirubin: 0.3 mg/dL (ref 0.3–1.2)
Total Protein: 8 g/dL (ref 6.0–8.3)

## 2011-03-01 LAB — GLUCOSE, CAPILLARY
Glucose-Capillary: 363 mg/dL — ABNORMAL HIGH (ref 70–99)
Glucose-Capillary: 256 mg/dL — ABNORMAL HIGH (ref 70–99)

## 2011-03-01 LAB — CBC
HCT: 40.9 % (ref 36.0–46.0)
MCH: 23.7 pg — ABNORMAL LOW (ref 26.0–34.0)
MCHC: 32.8 g/dL (ref 30.0–36.0)
MCV: 72.3 fL — ABNORMAL LOW (ref 78.0–100.0)
Platelets: 137 10*3/uL — ABNORMAL LOW (ref 150–400)
RDW: 14.2 % (ref 11.5–15.5)

## 2011-03-07 LAB — BLOOD GAS, ARTERIAL
Drawn by: 295541
FIO2: 0.21
O2 Saturation: 98.1
pCO2 arterial: 24.3 — ABNORMAL LOW
pO2, Arterial: 117 — ABNORMAL HIGH

## 2011-03-07 LAB — URINE MICROSCOPIC-ADD ON

## 2011-03-07 LAB — BASIC METABOLIC PANEL
BUN: 1 — ABNORMAL LOW
BUN: 6
CO2: 11 — ABNORMAL LOW
CO2: 12 — ABNORMAL LOW
CO2: 17 — ABNORMAL LOW
CO2: 19
Calcium: 8 — ABNORMAL LOW
Calcium: 8.2 — ABNORMAL LOW
Calcium: 8.3 — ABNORMAL LOW
Calcium: 8.4
Chloride: 110
Chloride: 110
Chloride: 113 — ABNORMAL HIGH
Creatinine, Ser: 0.5
Creatinine, Ser: 1.03
Creatinine, Ser: 1.04
GFR calc Af Amer: >60
GFR calc Af Amer: >60
GFR calc Af Amer: >60
GFR calc Af Amer: >60
GFR calc Af Amer: >60
GFR calc non Af Amer: >60
GFR calc non Af Amer: >60
Glucose, Bld: 164 — ABNORMAL HIGH
Glucose, Bld: 231 — ABNORMAL HIGH
Potassium: 3.4 — ABNORMAL LOW
Potassium: 3.6
Sodium: 134 — ABNORMAL LOW
Sodium: 136
Sodium: 137
Sodium: 140
Sodium: 140
Sodium: 141

## 2011-03-07 LAB — URINALYSIS, ROUTINE W REFLEX MICROSCOPIC
Leukocytes, UA: NEGATIVE
Nitrite: NEGATIVE
Specific Gravity, Urine: 1.028
pH: 5.5

## 2011-03-07 LAB — CBC
MCHC: 32.1
MCHC: 32.2
MCV: 77.7 — ABNORMAL LOW
Platelets: 126 — ABNORMAL LOW
RBC: 5.54 — ABNORMAL HIGH
RDW: 16.4 — ABNORMAL HIGH
RDW: 16.8 — ABNORMAL HIGH

## 2011-03-07 LAB — CK TOTAL AND CKMB (NOT AT ARMC)
Relative Index: INVALID
Total CK: 63

## 2011-03-07 LAB — COMPREHENSIVE METABOLIC PANEL
CO2: 8 — CL
Calcium: 8.3 — ABNORMAL LOW
Creatinine, Ser: 1.23 — ABNORMAL HIGH
GFR calc Af Amer: 59 — ABNORMAL LOW
GFR calc non Af Amer: 49 — ABNORMAL LOW
Glucose, Bld: 321 — ABNORMAL HIGH
Total Protein: 8.6 — ABNORMAL HIGH

## 2011-03-07 LAB — DIFFERENTIAL
Lymphocytes Relative: 33
Lymphs Abs: 2.4
Neutrophils Relative %: 61

## 2011-03-07 LAB — RAPID URINE DRUG SCREEN, HOSP PERFORMED
Cocaine: NOT DETECTED
Tetrahydrocannabinol: POSITIVE — AB

## 2011-03-07 LAB — MAGNESIUM
Magnesium: 1.6
Magnesium: 1.7
Magnesium: 1.8

## 2011-03-07 LAB — PHOSPHORUS
Phosphorus: 1.5 — ABNORMAL LOW
Phosphorus: 1.9 — ABNORMAL LOW

## 2011-03-07 LAB — TROPONIN I: Troponin I: 0.05

## 2011-03-07 LAB — LIPASE, BLOOD: Lipase: 29

## 2011-03-07 LAB — PREGNANCY, URINE: Preg Test, Ur: NEGATIVE

## 2011-03-12 ENCOUNTER — Emergency Department (HOSPITAL_COMMUNITY): Payer: Self-pay

## 2011-03-12 ENCOUNTER — Inpatient Hospital Stay (HOSPITAL_COMMUNITY)
Admission: EM | Admit: 2011-03-12 | Discharge: 2011-03-15 | DRG: 639 | Disposition: A | Discharge status: Home / Self Care | Payer: Self-pay | Attending: Internal Medicine | Admitting: Internal Medicine

## 2011-03-12 DIAGNOSIS — F319 Bipolar disorder, unspecified: Secondary | ICD-10-CM | POA: Diagnosis present

## 2011-03-12 DIAGNOSIS — E876 Hypokalemia: Secondary | ICD-10-CM | POA: Diagnosis present

## 2011-03-12 DIAGNOSIS — F1210 Cannabis abuse, uncomplicated: Secondary | ICD-10-CM | POA: Diagnosis present

## 2011-03-12 DIAGNOSIS — B1920 Unspecified viral hepatitis C without hepatic coma: Secondary | ICD-10-CM | POA: Diagnosis present

## 2011-03-12 DIAGNOSIS — B373 Candidiasis of vulva and vagina: Secondary | ICD-10-CM | POA: Diagnosis present

## 2011-03-12 DIAGNOSIS — F1011 Alcohol abuse, in remission: Secondary | ICD-10-CM | POA: Diagnosis present

## 2011-03-12 DIAGNOSIS — Z794 Long term (current) use of insulin: Secondary | ICD-10-CM

## 2011-03-12 DIAGNOSIS — Z91199 Patient's noncompliance with other medical treatment and regimen due to unspecified reason: Secondary | ICD-10-CM

## 2011-03-12 DIAGNOSIS — Z9119 Patient's noncompliance with other medical treatment and regimen: Secondary | ICD-10-CM

## 2011-03-12 DIAGNOSIS — F17200 Nicotine dependence, unspecified, uncomplicated: Secondary | ICD-10-CM | POA: Diagnosis present

## 2011-03-12 DIAGNOSIS — E1310 Other specified diabetes mellitus with ketoacidosis without coma: Principal | ICD-10-CM | POA: Diagnosis present

## 2011-03-12 DIAGNOSIS — B3731 Acute candidiasis of vulva and vagina: Secondary | ICD-10-CM | POA: Diagnosis present

## 2011-03-12 LAB — BASIC METABOLIC PANEL
BUN: 13 mg/dL (ref 6–23)
CO2: 12 mEq/L — ABNORMAL LOW (ref 19–32)
Calcium: 8.7 mg/dL (ref 8.4–10.5)
Chloride: 103 mEq/L (ref 96–112)
Creatinine, Ser: 0.57 mg/dL (ref 0.50–1.10)
GFR calc Af Amer: >60 mL/min (ref >60)
GFR calc non Af Amer: >60 mL/min (ref >60)
Glucose, Bld: 279 mg/dL — ABNORMAL HIGH (ref 70–99)
Potassium: 4.8 mEq/L (ref 3.5–5.1)
Sodium: 132 mEq/L — ABNORMAL LOW (ref 135–145)

## 2011-03-12 LAB — LIPASE, BLOOD: Lipase: 34 U/L (ref 11–59)

## 2011-03-12 LAB — RAPID URINE DRUG SCREEN, HOSP PERFORMED
Amphetamines: NOT DETECTED
Barbiturates: NOT DETECTED
Benzodiazepines: NOT DETECTED
Cocaine: NOT DETECTED
Opiates: NOT DETECTED
Tetrahydrocannabinol: POSITIVE — AB

## 2011-03-12 LAB — WET PREP, GENITAL: Trich, Wet Prep: NONE SEEN

## 2011-03-12 LAB — DIFFERENTIAL
Basophils Absolute: 0 10*3/uL (ref 0.0–0.1)
Eosinophils Absolute: 0.4 10*3/uL (ref 0.0–0.7)
Lymphocytes Relative: 42 % (ref 12–46)
Lymphs Abs: 5 10*3/uL — ABNORMAL HIGH (ref 0.7–4.0)
Monocytes Absolute: 0.6 10*3/uL (ref 0.1–1.0)
Neutro Abs: 5.9 10*3/uL (ref 1.7–7.7)

## 2011-03-12 LAB — URINE MICROSCOPIC-ADD ON

## 2011-03-12 LAB — GLUCOSE, CAPILLARY
Glucose-Capillary: 268 mg/dL — ABNORMAL HIGH (ref 70–99)
Glucose-Capillary: 208 mg/dL — ABNORMAL HIGH (ref 70–99)
Glucose-Capillary: 259 mg/dL — ABNORMAL HIGH (ref 70–99)

## 2011-03-12 LAB — CBC
HCT: 41.7 % (ref 36.0–46.0)
MCH: 23.4 pg — ABNORMAL LOW (ref 26.0–34.0)
MCHC: 32.1 g/dL (ref 30.0–36.0)
MCV: 72.9 fL — ABNORMAL LOW (ref 78.0–100.0)
Platelets: 169 10*3/uL (ref 150–400)
RDW: 14 % (ref 11.5–15.5)
WBC: 11.9 10*3/uL — ABNORMAL HIGH (ref 4.0–10.5)

## 2011-03-12 LAB — POCT PREGNANCY, URINE: Preg Test, Ur: NEGATIVE

## 2011-03-12 LAB — COMPREHENSIVE METABOLIC PANEL
AST: 70 U/L — ABNORMAL HIGH (ref 0–37)
Albumin: 4 g/dL (ref 3.5–5.2)
BUN: 20 mg/dL (ref 6–23)
Calcium: 10.2 mg/dL (ref 8.4–10.5)
Creatinine, Ser: 0.74 mg/dL (ref 0.50–1.10)
Total Protein: 8.7 g/dL — ABNORMAL HIGH (ref 6.0–8.3)

## 2011-03-12 LAB — LITHIUM LEVEL: Lithium Lvl: <0.25 mEq/L — ABNORMAL LOW (ref 0.80–1.40)

## 2011-03-12 LAB — URINALYSIS, ROUTINE W REFLEX MICROSCOPIC
Nitrite: NEGATIVE
Protein, ur: NEGATIVE mg/dL
Specific Gravity, Urine: 1.031 — ABNORMAL HIGH (ref 1.005–1.030)
Urobilinogen, UA: 0.2 mg/dL (ref 0.0–1.0)

## 2011-03-13 ENCOUNTER — Inpatient Hospital Stay (HOSPITAL_COMMUNITY): Payer: Self-pay

## 2011-03-13 LAB — BASIC METABOLIC PANEL
BUN: 10 mg/dL (ref 6–23)
BUN: 10 mg/dL (ref 6–23)
Calcium: 9.2 mg/dL (ref 8.4–10.5)
Chloride: 99 mEq/L (ref 96–112)
Chloride: 101 mEq/L (ref 96–112)
Creatinine, Ser: 0.7 mg/dL (ref 0.50–1.10)
Creatinine, Ser: 0.7 mg/dL (ref 0.50–1.10)
GFR calc Af Amer: >60 mL/min (ref >60)
GFR calc Af Amer: >60 mL/min (ref >60)
GFR calc non Af Amer: >60 mL/min (ref >60)
GFR calc non Af Amer: >60 mL/min (ref >60)

## 2011-03-13 LAB — GLUCOSE, CAPILLARY
Glucose-Capillary: 305 mg/dL — ABNORMAL HIGH (ref 70–99)
Glucose-Capillary: 376 mg/dL — ABNORMAL HIGH (ref 70–99)
Glucose-Capillary: 352 mg/dL — ABNORMAL HIGH (ref 70–99)

## 2011-03-13 LAB — GC/CHLAMYDIA PROBE AMP, GENITAL: GC Probe Amp, Genital: NEGATIVE

## 2011-03-14 LAB — HEMOGLOBIN A1C
Hgb A1c MFr Bld: 14.3 % — ABNORMAL HIGH (ref <5.7)
Mean Plasma Glucose: 364 mg/dL — ABNORMAL HIGH (ref <117)

## 2011-03-14 LAB — BASIC METABOLIC PANEL
BUN: 7 mg/dL (ref 6–23)
BUN: 8 mg/dL (ref 6–23)
BUN: 8 mg/dL (ref 6–23)
BUN: 8 mg/dL (ref 6–23)
Calcium: 8.6 mg/dL (ref 8.4–10.5)
Calcium: 8.8 mg/dL (ref 8.4–10.5)
Calcium: 9.1 mg/dL (ref 8.4–10.5)
Calcium: 9.7 mg/dL (ref 8.4–10.5)
Creatinine, Ser: 0.54 mg/dL (ref 0.50–1.10)
Creatinine, Ser: 0.56 mg/dL (ref 0.50–1.10)
Creatinine, Ser: 0.59 mg/dL (ref 0.50–1.10)
Creatinine, Ser: 0.82 mg/dL (ref 0.50–1.10)
GFR calc Af Amer: >60 mL/min (ref >60)
GFR calc Af Amer: >60 mL/min (ref >60)
GFR calc Af Amer: >60 mL/min (ref >60)
GFR calc non Af Amer: >60 mL/min (ref >60)
GFR calc non Af Amer: >60 mL/min (ref >60)
GFR calc non Af Amer: >60 mL/min (ref >60)
GFR calc non Af Amer: >60 mL/min (ref >60)
GFR calc non Af Amer: >60 mL/min (ref >60)
Glucose, Bld: 204 mg/dL — ABNORMAL HIGH (ref 70–99)
Glucose, Bld: 172 mg/dL — ABNORMAL HIGH (ref 70–99)
Potassium: 3.1 mEq/L — ABNORMAL LOW (ref 3.5–5.1)
Sodium: 135 mEq/L (ref 135–145)

## 2011-03-14 LAB — GLUCOSE, CAPILLARY
Glucose-Capillary: 95 mg/dL (ref 70–99)
Glucose-Capillary: 315 mg/dL — ABNORMAL HIGH (ref 70–99)
Glucose-Capillary: 139 mg/dL — ABNORMAL HIGH (ref 70–99)

## 2011-03-14 LAB — DIFFERENTIAL
Eosinophils Absolute: 0.3 10*3/uL (ref 0.0–0.7)
Eosinophils Relative: 4 % (ref 0–5)
Lymphocytes Relative: 60 % — ABNORMAL HIGH (ref 12–46)
Monocytes Absolute: 0.6 10*3/uL (ref 0.1–1.0)
Neutrophils Relative %: 29 % — ABNORMAL LOW (ref 43–77)

## 2011-03-14 LAB — CBC
Hemoglobin: 11.1 g/dL — ABNORMAL LOW (ref 12.0–15.0)
MCHC: 32 g/dL (ref 30.0–36.0)
RDW: 14 % (ref 11.5–15.5)
WBC: 8 10*3/uL (ref 4.0–10.5)

## 2011-03-14 LAB — COMPREHENSIVE METABOLIC PANEL
AST: 53 U/L — ABNORMAL HIGH (ref 0–37)
Albumin: 2.7 g/dL — ABNORMAL LOW (ref 3.5–5.2)
BUN: 8 mg/dL (ref 6–23)
Calcium: 8.6 mg/dL (ref 8.4–10.5)
Chloride: 99 mEq/L (ref 96–112)
Creatinine, Ser: 0.57 mg/dL (ref 0.50–1.10)
Total Bilirubin: 0.2 mg/dL — ABNORMAL LOW (ref 0.3–1.2)
Total Protein: 6.2 g/dL (ref 6.0–8.3)

## 2011-03-14 LAB — MAGNESIUM: Magnesium: 1.5 mg/dL (ref 1.5–2.5)

## 2011-03-15 LAB — BASIC METABOLIC PANEL
BUN: 8 mg/dL (ref 6–23)
BUN: 7 mg/dL (ref 6–23)
CO2: 23 mEq/L (ref 19–32)
CO2: 22 mEq/L (ref 19–32)
Calcium: 8.8 mg/dL (ref 8.4–10.5)
Calcium: 9.2 mg/dL (ref 8.4–10.5)
Calcium: 9.5 mg/dL (ref 8.4–10.5)
Chloride: 101 mEq/L (ref 96–112)
Chloride: 99 mEq/L (ref 96–112)
Creatinine, Ser: 0.55 mg/dL (ref 0.50–1.10)
Creatinine, Ser: 0.58 mg/dL (ref 0.50–1.10)
Creatinine, Ser: 0.58 mg/dL (ref 0.50–1.10)
GFR calc Af Amer: >60 mL/min (ref >60)
GFR calc Af Amer: >60 mL/min (ref >60)
GFR calc Af Amer: >60 mL/min (ref >60)
GFR calc non Af Amer: >60 mL/min (ref >60)
GFR calc non Af Amer: >60 mL/min (ref >60)
Glucose, Bld: 308 mg/dL — ABNORMAL HIGH (ref 70–99)
Glucose, Bld: 280 mg/dL — ABNORMAL HIGH (ref 70–99)
Potassium: 4.3 mEq/L (ref 3.5–5.1)
Potassium: 4.1 mEq/L (ref 3.5–5.1)
Sodium: 133 mEq/L — ABNORMAL LOW (ref 135–145)
Sodium: 133 mEq/L — ABNORMAL LOW (ref 135–145)
Sodium: 135 mEq/L (ref 135–145)

## 2011-03-15 LAB — CBC
Platelets: 98 10*3/uL — ABNORMAL LOW (ref 150–400)
RBC: 4.57 MIL/uL (ref 3.87–5.11)
RDW: 14.1 % (ref 11.5–15.5)
WBC: 8.7 10*3/uL (ref 4.0–10.5)

## 2011-03-15 LAB — GLUCOSE, CAPILLARY
Glucose-Capillary: 291 mg/dL — ABNORMAL HIGH (ref 70–99)
Glucose-Capillary: 234 mg/dL — ABNORMAL HIGH (ref 70–99)

## 2011-03-16 NOTE — Discharge Summary (Signed)
Mary [name], [name] NO.:  [id number]  MEDICAL RECORD NO.:  [id number]  LOCATION:  1516                         FACILITY:  [facility]  PHYSICIAN:  Hillery Aldo, M.D.   DATE OF BIRTH:  Jun 24, 1970  DATE OF ADMISSION:  03/12/2011 DATE OF DISCHARGE:  03/15/2011                              DISCHARGE SUMMARY   PRIMARY CARE PHYSICIAN:  Maurice March, M.D. at [facility].  NEW PRIMARY CARE PHYSICIAN:  [facility].  DISCHARGE DIAGNOSES: 1. Diabetic ketoacidosis, resolved. 2. Poorly controlled type 2 diabetes. 3. Hypokalemia. 4. History of bipolar disorder. 5. History of hepatitis C. 6. History of polysubstance abuse, in remission. 7. Ongoing cannabis abuse. 8. Tobacco abuse. 9. History of seizures. 10.Candidal vaginitis.  DISCHARGE MEDICATIONS: 1. NovoLog 10 units subcutaneously q.a.c. 2. NovoLog sliding scale insulin, insulin resistant scale, q.a.c. and     at bedtime. 3. Lantus insulin 30 units subcutaneously nightly. 4. Monistat 2% one application intravaginal and nightly p.r.n. vaginal     itching. 5. Enteric-coated aspirin 81 mg p.o. daily. 6. Protonix 40 mg p.o. daily.  CONSULTATIONS: 1. Inpatient diabetes program coordinator.  BRIEF ADMISSION HPI:  The patient is a 40 year old female with past medical history of poorly controlled type 2 diabetes and medical nonadherence with multiple inpatient hospitalizations for treatment of diabetic ketoacidosis, who presented to the hospital on the date of admission with a chief complaint of nausea, vomiting, abdominal pain, and elevated blood glucoses.  Upon initial evaluation in the emergency department, the patient was found to have a blood sugar of 470, ketones in the urine, and a bicarbonate of 15.  She subsequently was referred to the hospitalist service for further evaluation and treatment.  For full details, please see the dictated report done by Dr. Beverly Gust.  PROCEDURES AND  DIAGNOSTIC STUDIES: 1. Abdominal ultrasound on March 12, 2011, showed 2 possible tiny     gallstones or less likely tiny polyps, in the gallbladder.  No     evidence of acute cholecystitis or biliary ductal dilatation.  No     acute findings. 2. Chest x-ray on March 13, 2011 showed no acute cardiopulmonary     disease.  DISCHARGE LABORATORY VALUES:  Sodium was 135, potassium 4.1, chloride 99, bicarb 22, BUN 7, creatinine 0.58, glucose 280, calcium 9.5.  White blood cell count was 8.7, hemoglobin 10.5, hematocrit 33.1, platelets 98.  Hemoglobin A1c was 14.3.  Urine drug screen was positive for THC. Wet prep showed moderate clue cells with GC and chlamydia probes negative.  HOSPITAL COURSE BY PROBLEM: 1. Diabetic ketoacidosis:  The patient was admitted and put on fluid     volume resuscitation.  She was put on an insulin drip per the     [facility] protocol with ultimate discontinuation of the insulin     drip and transitioned over to basal/bolus insulin therapy.  Her     anion gap quickly closed and her symptoms resolved with appropriate     treatment.  She was seen in consultation with the inpatient     diabetes program coordinator, who recommended outpatient diabetes     education.  She also indicated that she did not want to return  to     [facility] for primary care and therefore has been referred to the     [facility] for further outpatient followup.  At this     point, her DKA has completely resolved. 2. Uncontrolled type 2 diabetes:  Given her hemoglobin A1c of 14.3,     she is at high risk for complications related to uncontrolled     diabetes.  Her mean plasma glucose is 364 and given this, she was     put on a combination of Lantus and sliding scale insulin along with     meal coverage.  Her glycemic control was improving but is still     suboptimal so she will need close followup in the outpatient     setting.  She will be set up for outpatient diabetes  education and     we will obtain a care manager consultation to ensure that she has     access to the medicines that she will need to adequately treat her     diabetes prior to discharge.  We will set her up with followup at     the [facility]. 3. Hypokalemia:  The patient's potassium was repleted prior to     discharge.  Her discharge potassium is 4.1. 4. Bacterial/candidal vaginitis:  The patient was initially put on     Flagyl for the presence clue cells on her wet prep.  The course of     her hospital stay, she continued to complain of significant     vaginitis and itching, and she was treated with a one-time dose of     Diflucan.  She is instructed to use over-the-counter Monistat for     ongoing symptom management. 5. History of bipolar disorder:  The patient is currently stable and     is not on any mood stabilizing medications at this time. 6. History of hepatitis C:  The patient's liver function studies were     checked and she does have mild LFT elevation, which is likely due     to her underlying hepatitis C.  An ultrasound was performed and     this did not show any acute problems. 7. History of polysubstance abuse:  The patient claims that she is     currently abstinent from heavy drugs, but does continue to use     cannabis.  She states that she has been clean from alcohol and     other illicit substances for at least 10 months. 8. Tobacco abuse:  The patient is counseled and given literature     regarding the importance of tobacco cessation and cessation     strategies. 9. History of seizures:  No events.  She is not currently on     antiseizure medications.  DISPOSITION:  Patient is medically stable and will be discharged home.  CONDITION ON DISCHARGE:  Improved.  DISCHARGE INSTRUCTIONS:  Activity:  No restrictions.  Diet:  Diabetic.  OTHER INSTRUCTIONS:  Stop smoking.  Follow up at the [facility].  The phone  number has been provided and the patient has been instructed to call for an appointment time.  Time spent coordinating care for discharge, discharge structured including face-to-face time equals approximately 35 minutes.     Hillery Aldo, M.D.     CR/MEDQ  D:  03/15/2011  T:  03/15/2011  Job:  [id number]  cc:   Maurice March, M.D. Fax: [phone]  [facility]  Electronically Signed by Hillery Aldo M.D. on 03/16/2011 03:13:36 PM

## 2011-03-22 LAB — CBC
HCT: 36.8
Hemoglobin: 12.1
MCV: 74.9 — ABNORMAL LOW
RDW: 15.5
WBC: 12 — ABNORMAL HIGH

## 2011-03-22 LAB — URINE MICROSCOPIC-ADD ON

## 2011-03-22 LAB — I-STAT 8, (EC8 V) (CONVERTED LAB)
Chloride: 99
Glucose, Bld: 589
Potassium: 4.2
pH, Ven: 7.432 — ABNORMAL HIGH

## 2011-03-22 LAB — URINALYSIS, ROUTINE W REFLEX MICROSCOPIC
Glucose, UA: >1000 — AB
Ketones, ur: 15 — AB
Leukocytes, UA: NEGATIVE
Protein, ur: NEGATIVE
Urobilinogen, UA: 0.2

## 2011-03-22 LAB — DIFFERENTIAL
Eosinophils Relative: 1
Lymphocytes Relative: 44
Lymphs Abs: 5.3 — ABNORMAL HIGH
Monocytes Absolute: 0.9

## 2011-03-22 LAB — POCT I-STAT CREATININE
Creatinine, Ser: 1
Operator id: 151321

## 2011-03-26 ENCOUNTER — Emergency Department (HOSPITAL_COMMUNITY): Payer: Medicaid Other

## 2011-03-26 ENCOUNTER — Encounter (HOSPITAL_COMMUNITY): Payer: Self-pay

## 2011-03-26 ENCOUNTER — Inpatient Hospital Stay (HOSPITAL_COMMUNITY)
Admission: EM | Admit: 2011-03-26 | Discharge: 2011-03-29 | DRG: 392 | Disposition: A | Discharge status: Home / Self Care | Payer: Medicaid Other | Source: Ambulatory Visit | Attending: Internal Medicine | Admitting: Internal Medicine

## 2011-03-26 DIAGNOSIS — R1013 Epigastric pain: Secondary | ICD-10-CM

## 2011-03-26 DIAGNOSIS — K8000 Calculus of gallbladder with acute cholecystitis without obstruction: Secondary | ICD-10-CM | POA: Diagnosis present

## 2011-03-26 DIAGNOSIS — K5909 Other constipation: Secondary | ICD-10-CM | POA: Diagnosis present

## 2011-03-26 DIAGNOSIS — Z7982 Long term (current) use of aspirin: Secondary | ICD-10-CM | POA: Diagnosis not present

## 2011-03-26 DIAGNOSIS — B1920 Unspecified viral hepatitis C without hepatic coma: Secondary | ICD-10-CM | POA: Diagnosis present

## 2011-03-26 DIAGNOSIS — Z23 Encounter for immunization: Secondary | ICD-10-CM

## 2011-03-26 DIAGNOSIS — F319 Bipolar disorder, unspecified: Secondary | ICD-10-CM | POA: Diagnosis present

## 2011-03-26 DIAGNOSIS — E876 Hypokalemia: Secondary | ICD-10-CM | POA: Diagnosis not present

## 2011-03-26 DIAGNOSIS — K8020 Calculus of gallbladder without cholecystitis without obstruction: Secondary | ICD-10-CM

## 2011-03-26 DIAGNOSIS — E109 Type 1 diabetes mellitus without complications: Secondary | ICD-10-CM | POA: Diagnosis present

## 2011-03-26 DIAGNOSIS — Z79899 Other long term (current) drug therapy: Secondary | ICD-10-CM | POA: Diagnosis not present

## 2011-03-26 DIAGNOSIS — R1011 Right upper quadrant pain: Principal | ICD-10-CM | POA: Diagnosis present

## 2011-03-26 DIAGNOSIS — E860 Dehydration: Secondary | ICD-10-CM | POA: Diagnosis present

## 2011-03-26 DIAGNOSIS — Z794 Long term (current) use of insulin: Secondary | ICD-10-CM | POA: Diagnosis not present

## 2011-03-26 DIAGNOSIS — F17200 Nicotine dependence, unspecified, uncomplicated: Secondary | ICD-10-CM | POA: Diagnosis present

## 2011-03-26 HISTORY — DX: Calculus of kidney: N20.0

## 2011-03-26 HISTORY — DX: Gastritis, unspecified, without bleeding: K29.70

## 2011-03-26 LAB — COMPREHENSIVE METABOLIC PANEL
ALT: 52 U/L — ABNORMAL HIGH (ref 0–35)
AST: 57 U/L — ABNORMAL HIGH (ref 0–37)
CO2: 15 mEq/L — ABNORMAL LOW (ref 19–32)
Calcium: 10 mg/dL (ref 8.4–10.5)
Chloride: 96 mEq/L (ref 96–112)
GFR calc non Af Amer: >90 mL/min (ref >90)
Sodium: 136 mEq/L (ref 135–145)

## 2011-03-26 LAB — URINALYSIS, ROUTINE W REFLEX MICROSCOPIC
Glucose, UA: >1000 mg/dL — AB
Hgb urine dipstick: NEGATIVE
Protein, ur: NEGATIVE mg/dL
Specific Gravity, Urine: 1.035 — ABNORMAL HIGH (ref 1.005–1.030)
pH: 6 (ref 5.0–8.0)

## 2011-03-26 LAB — CBC
MCH: 23.7 pg — ABNORMAL LOW (ref 26.0–34.0)
Platelets: 194 10*3/uL (ref 150–400)
RBC: 5.52 MIL/uL — ABNORMAL HIGH (ref 3.87–5.11)
WBC: 10.8 10*3/uL — ABNORMAL HIGH (ref 4.0–10.5)

## 2011-03-26 LAB — URINE MICROSCOPIC-ADD ON

## 2011-03-26 LAB — GLUCOSE, CAPILLARY: Glucose-Capillary: 388 mg/dL — ABNORMAL HIGH (ref 70–99)

## 2011-03-26 LAB — DIFFERENTIAL
Eosinophils Absolute: 0.1 10*3/uL (ref 0.0–0.7)
Monocytes Absolute: 0.3 10*3/uL (ref 0.1–1.0)
Neutrophils Relative %: 65 % (ref 43–77)

## 2011-03-26 MED ORDER — IOHEXOL 300 MG/ML  SOLN
100.0000 mL | Freq: Once | INTRAMUSCULAR | Status: AC | PRN
  Administered 2011-03-26: 100 mL via INTRAVENOUS

## 2011-03-27 LAB — GLUCOSE, CAPILLARY
Glucose-Capillary: 136 mg/dL — ABNORMAL HIGH (ref 70–99)
Glucose-Capillary: 298 mg/dL — ABNORMAL HIGH (ref 70–99)
Glucose-Capillary: 322 mg/dL — ABNORMAL HIGH (ref 70–99)
Glucose-Capillary: 364 mg/dL — ABNORMAL HIGH (ref 70–99)

## 2011-03-27 LAB — COMPREHENSIVE METABOLIC PANEL
ALT: 44 U/L — ABNORMAL HIGH (ref 0–35)
AST: 51 U/L — ABNORMAL HIGH (ref 0–37)
CO2: 14 mEq/L — ABNORMAL LOW (ref 19–32)
Chloride: 107 mEq/L (ref 96–112)
GFR calc non Af Amer: 73 mL/min — ABNORMAL LOW (ref >90)
Sodium: 144 mEq/L (ref 135–145)
Total Bilirubin: 0.2 mg/dL — ABNORMAL LOW (ref 0.3–1.2)

## 2011-03-27 LAB — CBC
Hemoglobin: 12.5 g/dL (ref 12.0–15.0)
MCH: 23.3 pg — ABNORMAL LOW (ref 26.0–34.0)
RBC: 5.37 MIL/uL — ABNORMAL HIGH (ref 3.87–5.11)

## 2011-03-27 LAB — BASIC METABOLIC PANEL
GFR calc Af Amer: >90 mL/min (ref >90)
GFR calc non Af Amer: 88 mL/min — ABNORMAL LOW (ref >90)
Potassium: 3.9 mEq/L (ref 3.5–5.1)
Sodium: 140 mEq/L (ref 135–145)

## 2011-03-28 LAB — HEPATIC FUNCTION PANEL
Albumin: 3 g/dL — ABNORMAL LOW (ref 3.5–5.2)
Total Bilirubin: 0.2 mg/dL — ABNORMAL LOW (ref 0.3–1.2)

## 2011-03-28 LAB — BASIC METABOLIC PANEL
GFR calc Af Amer: >90 mL/min (ref >90)
GFR calc non Af Amer: >90 mL/min (ref >90)
Glucose, Bld: 113 mg/dL — ABNORMAL HIGH (ref 70–99)
Potassium: 3.2 mEq/L — ABNORMAL LOW (ref 3.5–5.1)
Sodium: 145 mEq/L (ref 135–145)

## 2011-03-28 LAB — GLUCOSE, CAPILLARY
Glucose-Capillary: 147 mg/dL — ABNORMAL HIGH (ref 70–99)
Glucose-Capillary: 177 mg/dL — ABNORMAL HIGH (ref 70–99)
Glucose-Capillary: 57 mg/dL — ABNORMAL LOW (ref 70–99)

## 2011-03-28 LAB — MAGNESIUM: Magnesium: 1.7 mg/dL (ref 1.5–2.5)

## 2011-03-28 LAB — CBC
MCV: 72.8 fL — ABNORMAL LOW (ref 78.0–100.0)
Platelets: 152 10*3/uL (ref 150–400)
RDW: 14.6 % (ref 11.5–15.5)
WBC: 11.2 10*3/uL — ABNORMAL HIGH (ref 4.0–10.5)

## 2011-03-28 LAB — PHOSPHORUS: Phosphorus: 1.5 mg/dL — ABNORMAL LOW (ref 2.3–4.6)

## 2011-03-29 LAB — COMPREHENSIVE METABOLIC PANEL
AST: 58 — ABNORMAL HIGH
AST: 76 — ABNORMAL HIGH
Albumin: 3.3 — ABNORMAL LOW
Alkaline Phosphatase: 76
BUN: 13
CO2: 29
Calcium: 9.2
Chloride: 108
Creatinine, Ser: 0.81
GFR calc Af Amer: >60
GFR calc Af Amer: >60
GFR calc non Af Amer: >60
Glucose, Bld: 132 — ABNORMAL HIGH
Potassium: 3.9
Sodium: 143
Total Protein: 6.9
Total Protein: 6.9

## 2011-03-29 LAB — PREGNANCY, URINE: Preg Test, Ur: NEGATIVE

## 2011-03-29 LAB — RAPID URINE DRUG SCREEN, HOSP PERFORMED
Amphetamines: NOT DETECTED
Barbiturates: NOT DETECTED
Benzodiazepines: POSITIVE — AB
Cocaine: NOT DETECTED
Tetrahydrocannabinol: POSITIVE — AB
Tetrahydrocannabinol: POSITIVE — AB

## 2011-03-29 LAB — GLUCOSE, CAPILLARY
Glucose-Capillary: 170 mg/dL — ABNORMAL HIGH (ref 70–99)
Glucose-Capillary: 174 mg/dL — ABNORMAL HIGH (ref 70–99)
Glucose-Capillary: 64 mg/dL — ABNORMAL LOW (ref 70–99)

## 2011-03-29 NOTE — Consult Note (Signed)
  NAME:  Mary [name], Mary [name]                ACCOUNT NO.:  [id number]  MEDICAL RECORD NO.:  [id number]  LOCATION:  [facility]                         FACILITY:  [facility]  PHYSICIAN:  Wilmon Arms. Corliss Skains, M.D. DATE OF BIRTH:  [birth year]  DATE OF CONSULTATION:  03/26/2011 DATE OF DISCHARGE:                                CONSULTATION   CONSULTING PHYSICIAN:  Hassan Buckler. Caporossi, MD  REASON FOR CONSULTATION:  Epigastric abdominal pain.  HISTORY:  This is a 40 year old female who presents with a recent history of frequent upper abdominal pain and nausea, who awoke this morning with significant epigastric and right upper quadrant abdominal pain.  She has had some nausea and vomiting today.  She denies any diarrhea.  She does have chronic constipation.  She presented to the emergency department for evaluation and was found to have cholelithiasis.  The patient has a recent hospital admission for similar symptoms.  PAST MEDICAL HISTORY:  Constipation, nephrolithiasis, depression, bipolar disorder, seizure disorder, diabetes, gastritis, hepatitis C.  PAST SURGICAL HISTORY:  Right ankle surgery and C-section.  FAMILY HISTORY:  Diabetes, alcohol abuse.  SOCIAL HISTORY:  The patient smokes a pack a day.  She has been off alcohol and no illicit drugs for several months.  REVIEW OF SYSTEMS:  Otherwise, negative.  MEDICATIONS:  Insulin, lithium unclear doses.  ALLERGIES:  To PENICILLIN.  PHYSICAL EXAMINATION:  GENERAL:  This is an ill-appearing female, in no apparent distress. VITAL SIGNS:  Temperature 98.3, heart rate 70, blood pressure 149/99, respirations 20, sats 100% on room air. HEENT:  EOMI.  Sclerae anicteric. NECK:  No mass, no thyromegaly. LUNGS:  Clear.  Normal respiratory effort. HEART:  Regular rate and rhythm.  No murmur. ABDOMEN:  Soft, tender in the epigastrium and right upper quadrant, radiating through to her back. EXTREMITIES:  No edema. SKIN:  Warm and dry.  No sign of  jaundice.  LABS:  White count 10.9, hemoglobin 13.1.  Electrolytes within normal limits.  Glucose 474.  Lipase 30.  Total bilirubin 0.3, alkaline phosphatase 101, AST 57, ALT 52.  CT scan abdomen and pelvis shows cholelithiasis with chronic constipation.  IMPRESSION: 1. Early acute cholecystitis 2. Poorly controlled diabetes.  PLAN:  The patient will be admitted by [facility] who will manage her blood sugars.  I would start her on IV antibiotics, keep her n.p.o. She may have some ice chips.  She will need a laparoscopic cholecystectomy and intraoperative cholangiogram.     Wilmon Arms. Corliss Skains, M.D.     MKT/MEDQ  D:  03/26/2011  T:  03/26/2011  Job:  [id number]  Electronically Signed by Manus Rudd M.D. on 03/29/2011 05:10:19 PM

## 2011-03-29 NOTE — Discharge Summary (Signed)
[name], [name]                ACCOUNT NO.:  [id number]  MEDICAL RECORD NO.:  [id number]  LOCATION:  5504                         FACILITY:  [facility]  PHYSICIAN:  Conley Canal, MD      DATE OF BIRTH:  1971/02/07  DATE OF ADMISSION:  03/26/2011 DATE OF DISCHARGE:  03/29/2011                        DISCHARGE SUMMARY - REFERRING   PRIMARY CARE PROVIDER:  None.  CONSULTING PHYSICIAN:  Wilmon Arms. Corliss Skains, MD  DISCHARGE DIAGNOSES: 1. Acute abdominal pain in the setting of cholelithiasis with     suspicion of early acute cholecystitis versus constipation. 2. Diabetes mellitus type 1, poorly controlled.  Hemoglobin A1c 15.1. 3. Hepatitis C. 4. Bipolar disorder. 5. History of polysubstance abuse. 6. Seizure disorder. 7. Nephrolithiasis. 8. Depression.  DISCHARGE MEDICATIONS: 1. Ciprofloxacin 500 mg twice daily for 6 more days. 2. Colace 100 mg twice daily. 3. Aspirin 81 mg daily. 4. Lantus 30 units subcu at bedtime. 5. Miconazole vaginal cream 2% daily at bedtime. 6. NovoLog FlexPen 10 units before meals. 7. Protonix 40 mg daily.  PROCEDURES PERFORMED:  CT abdomen and pelvis on March 26, 2011 showed no acute process in the abdomen or pelvis.  Probable constipation, possible fecal impaction, cholelithiasis, small hiatal hernia with probable esophageal dysmotility versus gastroesophageal reflux disease.  HOSPITAL COURSE:  [name] is a pleasant 40 year old female with insulin- dependent diabetes mellitus who came in with complaints of right upper quadrant abdominal pain with initial suspicion for acute cholecystitis. She was in fact seen by Dr. Corliss Skains who felt that she may have an early acute cholecystitis.  CT abdomen and pelvis suggested possible fecal impaction for which the patient was given laxatives with movement of her bowel.  At the time of admission, the patient's white count was 10.8, this increased to 18,000 before improving to 11.2 on March 29, 2011. She was  started on ciprofloxacin and Flagyl at admission and later tapered to ciprofloxacin alone.  Surgery continued to follow the patient during the hospital stay, and the feeling was that the patient did not have acute cholecystitis and they felt that cholecystectomy was not warranted at this point.  She has had some GI workup at [facility] including EGD and colonoscopy, results of which were not available at the time of discharge.  The patient has not followed routinely for primary care management, but she has been at the [facility] previously.  I encouraged her to follow with [facility].  If she continues to have symptoms including right upper quadrant pain, it may be secondary to the cholelithiasis and she would obviously require cholecystectomy.  Of note, in this admission is that her hemoglobin A1c was 15.1, pointing to very poor control of her diabetes.  I highlighted this with the patient and also mentioned the possibility of gastroparesis, especially in setting of poorly-controlled diabetes mellitus.  Today, she feels better.  LABORATORY DATA:  Alkaline phosphatase 75, AST 62, ALT 41, total bilirubin 0.2.  WBC 11.2, hemoglobin 10.6, hematocrit 33.2, platelet count 152.  Electrolytes with magnesium 1.6, phosphorus 1.5, potassium 3.2, these electrolytes were replenished prior to the discharge.  DISPOSITION:  She is discharged in relatively stable condition to complete 6 more days of  ciprofloxacin and to follow with [facility] in the next 1-2 weeks.  TIME SPENT:  Time spent for discharge preparation is less than 30 minutes.     Conley Canal, MD     SR/MEDQ  D:  03/29/2011  T:  03/29/2011  Job:  [id number]  cc:   Clinic [facility]  Electronically Signed by Conley Canal  on 03/29/2011 06:09:44 PM

## 2011-03-30 NOTE — H&P (Signed)
Mary [name], Mary [name]                ACCOUNT NO.:  [id number]  MEDICAL RECORD NO.:  [id number]  LOCATION:  5504                         FACILITY:  [facility]  PHYSICIAN:  Lonia Blood, M.D.      DATE OF BIRTH:  08/23/1970  DATE OF ADMISSION:  03/26/2011 DATE OF DISCHARGE:                             HISTORY & PHYSICAL   PRIMARY CARE PHYSICIAN:  She goes to [facility], so she is unassigned.  PRESENTING COMPLAINT:  Right upper quadrant abdominal pain.  HISTORY OF PRESENT ILLNESS:  The patient is a 40 year old female with history of diabetes among other things who was recently admitted from March 12, 2011 to March 15, 2011 at [facility] secondary to what was thought to be diabetic ketoacidosis at that time. She went home and was doing fine until 2 days ago when she started having right upper quadrant abdominal pain.  During that hospitalization, she had abdominal pain and abdominal ultrasound showed 2 possible tiny gallstones or tiny polyps in the gallbladder, but no acute cholecystitis or biliary ductal dilatation.  Today, however, she is having evidence of cholelithiasis.  The patient describes some symptoms of persistent nausea, vomiting, and not able to give anything down in addition to the abdominal pain.  The pain is more in the epigastric region and rated as high as 10/10 prior to coming to the ED, currently down to 2/10.  She has not had any prior chest pain like this. Denied any diarrhea.  Denied any constipation.  Denied any cough.  No shortness of breath.  PAST MEDICAL HISTORY:  Significant for: 1. Insulin-dependent diabetes with frequent DKA. 2. Bipolar disorder. 3. Hepatitis C. 4. History of polysubstance abuse. 5. Gastritis. 6. Seizure disorder. 7. Nephrolithiasis. 8. History of right ankle surgery and liver disease. 9. Depression.  ALLERGIES:  PENICILLIN.  CURRENT MEDICATIONS: 1. NovoLog Flex insulin 10 units before meals. 2. Miconazole nitrate  2% vaginal cream at bedtime. 3. Protonix 40 mg daily. 4. Lantus insulin 30 units at bedtime. 5. Aspirin enteric coated 81 mg daily.  SOCIAL HISTORY:  The patient lives here in [place] with her fiance. She goes to cosmetology school.  Smokes about a pack per day.  Quit alcohol and IV drugs about 10 months ago.  FAMILY HISTORY:  Mother has insulin-dependent diabetes.  Father was alcoholic and died of liver cirrhosis.  REVIEW OF SYSTEMS:  All systems reviewed are negative except per HPI.  PHYSICAL EXAMINATION:  VITAL SIGNS:  Temperature 97.9, blood pressure 114/94, pulse 71, respiratory rate of 20, sats 100% on room air. GENERAL:  She is awake, alert, and oriented.  She is in no acute distress. HEENT:  PERRL.  EOMI.  No pallor.  No jaundice.  No rhinorrhea. NECK:  Supple.  No JVD.  No lymphadenopathy. RESPIRATORY:  She has good air entry bilaterally.  No wheezes.  No rales.  No crackles. CARDIOVASCULAR SYSTEM:  She has S1 and S2.  No murmur. ABDOMEN:  Full, soft with diffuse tenderness, mainly in the epigastric region. EXTREMITIES:  No edema, cyanosis, or clubbing. SKIN:  No rashes or ulcers.  LABORATORY DATA:  Sodium is 136, potassium 3.8, chloride 96, CO2 of 15, glucose 474,  BUN 14, creatinine 0.69 with a calcium 10.02.  The rest of the LFTs seem to be within normal.  It showed that she has AST of 57, ALT of 52.  Her lipase is 30.  White count 10.8 with regular differential, hemoglobin 13.1, platelet of 194.  Her urinalysis showed glucosuria and ketones were 80.  Urine microscopy is rare.  Abdominal x- ray showed negative chest, moderate stool in the colon without evidence for proximal obstruction and scoliosis.  CT abdomen and pelvis showed no acute process in the abdomen or pelvis, probable constipation, possible fecal impaction and cholelithiasis with a small hiatal hernia with probable esophageal dysmotility and GERD.  ASSESSMENT:  This is a 40 year old female presenting  with what appears to be cholelithiasis, but also hyperglycemia.  She is slightly ketotic, but not in frank diabetic ketoacidosis.  PLAN: 1. Cholelithiasis.  The patient has been seen by Surgery and planned     for possible surgery.  At this point, she will need some elective     cholecystitis.  We will, however, admit her and put her on some     antibiotics pain control.  We will also control her nausea,     vomiting pending the surgery. 2. Hyperglycemia.  Again, the patient has had recent DKA and seemed to     be headed without anything done.  I will hydrate her aggressively,     give her subacute insulin, and continue using sliding scale     insulin.  So far, her sugars have been coming down slightly since     arrival.  So, I am not going to start IV insulin yet.  However, if     she goes into DKA or seems to be worsening, I will start her on IV     insulin. 3. Depression.  We will continue with home medications. 4. Dehydration.  Again, I will continue with her fluid resuscitation. 5. History of seizure disorder.  She does not have any evidence of     that at this point. 6. Tobacco abuse.  I will put her on nicotine patch and also initiate     tobacco cessation counseling. 7. Further treatment will depend on how the patient responds to these     measures.     Lonia Blood, M.D.     Verlin Grills  D:  03/27/2011  T:  03/27/2011  Job:  [id number]  Electronically Signed by Lonia Blood M.D. on 03/30/2011 81:19:14 PM

## 2011-04-09 ENCOUNTER — Emergency Department (HOSPITAL_COMMUNITY)
Admission: EM | Admit: 2011-04-09 | Discharge: 2011-04-10 | Disposition: A | Discharge status: Home / Self Care | Payer: Medicaid Other | Attending: Emergency Medicine | Admitting: Emergency Medicine

## 2011-04-09 ENCOUNTER — Emergency Department (HOSPITAL_COMMUNITY): Payer: Medicaid Other

## 2011-04-09 DIAGNOSIS — Z79899 Other long term (current) drug therapy: Secondary | ICD-10-CM | POA: Diagnosis not present

## 2011-04-09 DIAGNOSIS — E119 Type 2 diabetes mellitus without complications: Secondary | ICD-10-CM | POA: Insufficient documentation

## 2011-04-09 DIAGNOSIS — G40909 Epilepsy, unspecified, not intractable, without status epilepticus: Secondary | ICD-10-CM | POA: Insufficient documentation

## 2011-04-09 DIAGNOSIS — R000 Tachycardia, unspecified: Secondary | ICD-10-CM | POA: Diagnosis not present

## 2011-04-09 DIAGNOSIS — R630 Anorexia: Secondary | ICD-10-CM | POA: Diagnosis not present

## 2011-04-09 DIAGNOSIS — Z7982 Long term (current) use of aspirin: Secondary | ICD-10-CM | POA: Insufficient documentation

## 2011-04-09 DIAGNOSIS — Z8619 Personal history of other infectious and parasitic diseases: Secondary | ICD-10-CM | POA: Diagnosis not present

## 2011-04-09 DIAGNOSIS — Z794 Long term (current) use of insulin: Secondary | ICD-10-CM | POA: Diagnosis not present

## 2011-04-09 DIAGNOSIS — R109 Unspecified abdominal pain: Secondary | ICD-10-CM | POA: Diagnosis present

## 2011-04-09 DIAGNOSIS — R1110 Vomiting, unspecified: Secondary | ICD-10-CM | POA: Insufficient documentation

## 2011-04-09 DIAGNOSIS — F341 Dysthymic disorder: Secondary | ICD-10-CM | POA: Insufficient documentation

## 2011-04-09 LAB — COMPREHENSIVE METABOLIC PANEL
AST: 63 U/L — ABNORMAL HIGH (ref 0–37)
CO2: 12 mEq/L — ABNORMAL LOW (ref 19–32)
Chloride: 98 mEq/L (ref 96–112)
Creatinine, Ser: 0.49 mg/dL — ABNORMAL LOW (ref 0.50–1.10)
GFR calc non Af Amer: >90 mL/min (ref >90)
Total Bilirubin: 0.2 mg/dL — ABNORMAL LOW (ref 0.3–1.2)

## 2011-04-09 LAB — DIFFERENTIAL
Eosinophils Absolute: 0.2 10*3/uL (ref 0.0–0.7)
Lymphocytes Relative: 43 % (ref 12–46)
Lymphs Abs: 5.1 10*3/uL — ABNORMAL HIGH (ref 0.7–4.0)
Neutro Abs: 5.9 10*3/uL (ref 1.7–7.7)
Neutrophils Relative %: 49 % (ref 43–77)

## 2011-04-09 LAB — URINALYSIS, ROUTINE W REFLEX MICROSCOPIC
Bilirubin Urine: NEGATIVE
Glucose, UA: >1000 mg/dL — AB
Hgb urine dipstick: NEGATIVE
Ketones, ur: >80 mg/dL — AB
Protein, ur: NEGATIVE mg/dL

## 2011-04-09 LAB — GLUCOSE, CAPILLARY: Glucose-Capillary: 278 mg/dL — ABNORMAL HIGH (ref 70–99)

## 2011-04-09 LAB — URINE MICROSCOPIC-ADD ON

## 2011-04-09 LAB — CBC
HCT: 41 % (ref 36.0–46.0)
MCV: 72.1 fL — ABNORMAL LOW (ref 78.0–100.0)
Platelets: 203 10*3/uL (ref 150–400)
RBC: 5.69 MIL/uL — ABNORMAL HIGH (ref 3.87–5.11)
WBC: 11.9 10*3/uL — ABNORMAL HIGH (ref 4.0–10.5)

## 2011-04-09 LAB — POCT PREGNANCY, URINE: Preg Test, Ur: NEGATIVE

## 2011-04-14 NOTE — H&P (Signed)
NAMEAINO, HECKERT NO.:  [id number]  MEDICAL RECORD NO.:  [id number]  LOCATION:  [facility]                         FACILITY:  [facility]  PHYSICIAN:  Jonny Ruiz, MD    DATE OF BIRTH:  20-Sep-1970  DATE OF ADMISSION:  03/12/2011 DATE OF DISCHARGE:                             HISTORY & PHYSICAL   CHIEF COMPLAINT:  Abdominal pain.  HISTORY OF PRESENT ILLNESS:  The patient is a 40 year old African American female with a history of type 2 diabetes mellitus diagnosed at the age of 59 presenting with abdominal pain, nausea, and vomiting for the last 2 days.  The onset was sudden.  The vomiting is clear without blood or mucus.  No associated fever, chills.  Denies hematemesis or melena.  The patient was worked up in the ED and was found with a blood sugar of 470 and ketones in her urine as well as a bicarb of 15. Insulin 10 units was given as well as Dilaudid, Zofran, Protonix.  At the time of assessment, the patient was feeling better with no abdominal pain, nausea or vomiting and actually was feeling hungry.  Her glucose had come down to 208.  She admits to forgetting her insulin over the last 2 days.  She also admits to diet indiscretion.  She says that it is too hard for her to stay on a diet.  The patient's last menstrual period was in the beginning of this month and she has been regular without missing periods.  PAST MEDICAL HISTORY: 1. Diabetes mellitus type 2, insulin requiring DKA prone. 2. Bipolar disorder. 3. Hepatitis C. 4. Polysubstance abuse, quit alcohol and drugs 10 months ago, but     continued to smoke. 5. Gastritis. 6. Seizures. 7. Nephrolithiasis. 8. History of C-section. 9. History of right ankle surgery.  MEDICATIONS:  She is on insulin 70/30 30 units b.i.d. and lithium carbonate dose unknown.  ALLERGIES:  PENICILLIN.  FAMILY HISTORY:  Mother had diabetes mellitus, on insulin.  Father was an alcoholic and died of alcoholic  cirrhosis.  SOCIAL HISTORY:  The patient lives with her fiance.  She is attending cosmetology school.  She is smokes 1 pack per day.  No alcohol or illicit for the last 10 months.  REVIEW OF SYSTEMS:  CONSTITUTIONAL:  Denies fever, chills, night sweats, fatigue, or malaise.  ENT: Denies runny nose, epistaxis, sore throat, or earache.  CARDIOVASCULAR:  Denies chest pain, shortness of breath, or palpitations.  RESPIRATORY:  Denies cough or wheezes.  GASTROINTESTINAL: See HPI.  Denies diarrhea.  GU: Complains of dysuria and a vaginal discharge, yellow, associated to vaginal pruritus.  MUSCULOSKELETAL: No joint swelling or arthralgias.  DERMATOLOGIC:  No rashes or ulcers. NEUROLOGICAL:  No headache, neck pain, focal weakness, numbness, or paresthesias.  PHYSICAL EXAMINATION:  VITAL SIGNS:  BP 103/68, pulse 64, respirations 16, temperature 97.7, O2 sat 100%. GENERAL APPEARANCE:  The patient is a [place] American woman who appears older than stated age. HEENT:  Unremarkable except for facial acne. NECK:  Supple with no lymphadenopathy. HEART:  Regular, S1 and S2 without gallops, murmurs, or rubs. LUNGS:  Clear to auscultation. ABDOMEN:  Slightly distended with normal bowel sounds,  soft, and tender on epigastrium without guarding or rebounding tenderness. EXTREMITIES: No clubbing, cyanosis, or edema. NEUROLOGICAL EXAMINATION.  No meningeal signs and no focal deficits.  LABORATORY DATA:  Sodium 134, potassium 4.4, chloride 94, bicarb of 15, BUN 20, creatinine 0.74, AST 70, ALT 56, albumin 4.0, total protein 8.7, alk phos 110, total bili 0.2, lithium less than 0.25, lipase 34, WBCs 11.9, hemoglobin 13.4, hematocrit 41.7, MCV 72.9, platelet count 169, neutrophils 50, lymphocytes 42%.  Vaginal secretions wet prep obtained by ED, Dr. Cathren Laine, showed clue cells.  No yeast or Trichomonas. Urine pregnancy test negative.  MEDICAL DECISION MAKING: 12. A 40 year old female with a history of  type 2 diabetes mellitus,     presenting with mild DKA due to noncompliance with insulin and     diet. 2. Bacterial vaginosis. 3. History of hepatitis C. 4. History of gastritis. 5. History of alcohol and drug abuse, now on remission for the last 10     months.  Continued tobacco use. 6. History of seizures.  PLAN:  Admit the patient to med-surge unit.  Continue aggressive IV fluids with normal saline with KCl 10 mEq at 150 cc an hour.  This patient has already received 2.5 L of normal saline IV bolus in the ED, which seems appropriate.  I do not see any signs of infection.  Her abdominal pain is better and was probably related to DKA itself.  The patient will also receive metronidazole 500 mg p.o. t.i.d. for her bacterial vaginosis.  Obtain consultation with inpatient diabetes nurse coordinator and social worker for arranging appropriate follow up.          ______________________________ Jonny Ruiz, MD     GL/MEDQ  D:  03/12/2011  T:  03/12/2011  Job:  [id number]  Electronically Signed by Jonny Ruiz MD on 04/14/2011 09:32:13 PM

## 2011-04-25 ENCOUNTER — Emergency Department (HOSPITAL_COMMUNITY)
Admission: EM | Admit: 2011-04-25 | Discharge: 2011-04-25 | Disposition: A | Discharge status: Home / Self Care | Payer: Medicaid Other | Attending: Emergency Medicine | Admitting: Emergency Medicine

## 2011-04-25 ENCOUNTER — Encounter (HOSPITAL_COMMUNITY): Payer: Self-pay | Admitting: *Deleted

## 2011-04-25 ENCOUNTER — Emergency Department (HOSPITAL_COMMUNITY): Payer: Medicaid Other

## 2011-04-25 DIAGNOSIS — IMO0002 Reserved for concepts with insufficient information to code with codable children: Secondary | ICD-10-CM | POA: Insufficient documentation

## 2011-04-25 DIAGNOSIS — R197 Diarrhea, unspecified: Secondary | ICD-10-CM | POA: Insufficient documentation

## 2011-04-25 DIAGNOSIS — E119 Type 2 diabetes mellitus without complications: Secondary | ICD-10-CM | POA: Diagnosis not present

## 2011-04-25 DIAGNOSIS — Z8619 Personal history of other infectious and parasitic diseases: Secondary | ICD-10-CM | POA: Insufficient documentation

## 2011-04-25 DIAGNOSIS — F411 Generalized anxiety disorder: Secondary | ICD-10-CM | POA: Diagnosis not present

## 2011-04-25 DIAGNOSIS — R109 Unspecified abdominal pain: Secondary | ICD-10-CM | POA: Diagnosis not present

## 2011-04-25 DIAGNOSIS — R112 Nausea with vomiting, unspecified: Secondary | ICD-10-CM | POA: Insufficient documentation

## 2011-04-25 LAB — COMPREHENSIVE METABOLIC PANEL
ALT: 66 U/L — ABNORMAL HIGH (ref 0–35)
AST: 64 U/L — ABNORMAL HIGH (ref 0–37)
CO2: 12 mEq/L — ABNORMAL LOW (ref 19–32)
Chloride: 98 mEq/L (ref 96–112)
Creatinine, Ser: 0.43 mg/dL — ABNORMAL LOW (ref 0.50–1.10)
GFR calc non Af Amer: >90 mL/min (ref >90)
Glucose, Bld: 381 mg/dL — ABNORMAL HIGH (ref 70–99)
Sodium: 138 mEq/L (ref 135–145)
Total Bilirubin: 0.3 mg/dL (ref 0.3–1.2)

## 2011-04-25 LAB — URINALYSIS, ROUTINE W REFLEX MICROSCOPIC
Glucose, UA: >1000 mg/dL — AB
Hgb urine dipstick: NEGATIVE
Ketones, ur: >80 mg/dL — AB
Leukocytes, UA: NEGATIVE
Protein, ur: NEGATIVE mg/dL

## 2011-04-25 LAB — DIFFERENTIAL
Basophils Absolute: 0 10*3/uL (ref 0.0–0.1)
Basophils Relative: 0 % (ref 0–1)
Eosinophils Absolute: 0.1 10*3/uL (ref 0.0–0.7)
Eosinophils Relative: 1 % (ref 0–5)
Monocytes Absolute: 0.3 10*3/uL (ref 0.1–1.0)

## 2011-04-25 LAB — CBC
HCT: 41.3 % (ref 36.0–46.0)
MCH: 23.1 pg — ABNORMAL LOW (ref 26.0–34.0)
MCHC: 31.7 g/dL (ref 30.0–36.0)
MCV: 72.8 fL — ABNORMAL LOW (ref 78.0–100.0)
RDW: 15.7 % — ABNORMAL HIGH (ref 11.5–15.5)

## 2011-04-25 LAB — LIPASE, BLOOD: Lipase: 26 U/L (ref 11–59)

## 2011-04-25 MED ORDER — MORPHINE SULFATE 4 MG/ML IJ SOLN
4.0000 mg | Freq: Once | INTRAMUSCULAR | Status: AC
  Administered 2011-04-25: 4 mg via INTRAVENOUS
  Filled 2011-04-25: qty 1

## 2011-04-25 MED ORDER — INSULIN GLARGINE 100 UNIT/ML ~~LOC~~ SOLN
5.0000 [IU] | Freq: Once | SUBCUTANEOUS | Status: AC
  Administered 2011-04-25: 5 [IU] via SUBCUTANEOUS
  Filled 2011-04-25: qty 1

## 2011-04-25 MED ORDER — ONDANSETRON HCL 4 MG/2ML IJ SOLN
4.0000 mg | Freq: Once | INTRAMUSCULAR | Status: AC
  Administered 2011-04-25: 4 mg via INTRAVENOUS
  Filled 2011-04-25: qty 2

## 2011-04-25 MED ORDER — SODIUM CHLORIDE 0.9 % IV BOLUS (SEPSIS)
1000.0000 mL | Freq: Once | INTRAVENOUS | Status: AC
  Administered 2011-04-25: 1000 mL via INTRAVENOUS

## 2011-04-25 MED ORDER — METOCLOPRAMIDE HCL 10 MG PO TABS: 10.0000 mg | ORAL_TABLET | Freq: Four times a day (QID) | ORAL | Status: DC

## 2011-04-25 MED ORDER — PANTOPRAZOLE SODIUM 40 MG IV SOLR
40.0000 mg | Freq: Once | INTRAVENOUS | Status: AC
  Administered 2011-04-25: 40 mg via INTRAVENOUS
  Filled 2011-04-25: qty 40

## 2011-04-25 MED ORDER — SODIUM CHLORIDE 0.9 % IV BOLUS (SEPSIS): 1000.0000 mL | Freq: Once | INTRAVENOUS | Status: DC

## 2011-04-25 NOTE — ED Notes (Signed)
Pt back from XR 

## 2011-04-25 NOTE — ED Notes (Signed)
Pt incontent, clothes are all full of urine. Gave pt paper scrubs and briefs, her son Mary [name] is coming to pick her up. Pt will wait in waiting room until this time

## 2011-04-25 NOTE — ED Notes (Signed)
Pt presenting to ed with c/o abdominal pain x 4 days. Pt is alert and oriented pt yelling pt drinking out of faucet upon entry into room pt encouraged to lay in bed. Pt screaming. Pt with positive nausea and vomiting yellowish colored.

## 2011-04-25 NOTE — ED Provider Notes (Signed)
Medical screening examination/treatment/procedure(s) were performed by non-physician practitioner and as supervising physician I was immediately available for consultation/collaboration.   Forbes Cellar, MD 04/25/11 2022

## 2011-04-25 NOTE — ED Notes (Signed)
Patient is currently lying in bed. Family called to speak with patient and pateint refused.

## 2011-04-25 NOTE — ED Provider Notes (Signed)
History     CSN: [id number] Arrival date & time: 04/25/2011 11:55 AM   First MD Initiated Contact with Patient 04/25/11 1204      Chief Complaint  Patient presents with  . Abdominal Pain    (Consider location/radiation/quality/duration/timing/severity/associated sxs/prior treatment) Patient is a 40 y.o. female presenting with abdominal pain. The history is provided by the patient. The history is limited by the condition of the patient.  Abdominal Pain The primary symptoms of the illness include abdominal pain, nausea, vomiting and diarrhea. The primary symptoms of the illness do not include fever, fatigue, shortness of breath, hematemesis, hematochezia, dysuria, vaginal discharge or vaginal bleeding. The current episode started more than 2 days ago. The onset of the illness was gradual. The problem has been gradually worsening.  The patient states that she believes she is currently not pregnant. The patient has not had a change in bowel habit. Symptoms associated with the illness do not include chills, anorexia, diaphoresis, heartburn, constipation, urgency, hematuria, frequency or back pain. Significant associated medical issues include PUD, GERD and gallstones.  The patient states that she has abdominal pain that started on Sunday. This seems to be located mostly in the epigastric region. She developed nausea and vomited today, which prompted her to come to the ED. She states that her bowel movements have been normal, but she did have a slightly loose movement today. She denies any dysuria, pyuria, unusual vaginal bleeding or discharge. She does have a history of diabetes and reports that her blood sugars at home have been in the high 200s.  A review of the patient's previous chart indicates that she has been seen for the same multiple times. She has gotten extensive abdominal imaging/us. She apparently has cholelithiasis (2 small stones) as well as a small hiatal hernia.  Past Medical  History  Diagnosis Date  . Diabetes mellitus   . Hep C w/o coma, chronic   . Kidney stone   . Seizure   . Gastritis     No past surgical history on file.  No family history on file.  History  Substance Use Topics  . Smoking status: Not on file  . Smokeless tobacco: Not on file  . Alcohol Use: Not on file    OB History    Grav Para Term Preterm Abortions TAB SAB Ect Mult Living                  Review of Systems  Constitutional: Negative for fever, chills, diaphoresis, activity change, appetite change, fatigue and unexpected weight change.  HENT: Negative.   Eyes: Negative.   Respiratory: Negative for cough, chest tightness, shortness of breath and wheezing.   Gastrointestinal: Positive for nausea, vomiting, abdominal pain and diarrhea. Negative for heartburn, constipation, hematochezia, anorexia and hematemesis.  Genitourinary: Negative for dysuria, urgency, frequency, hematuria, vaginal bleeding and vaginal discharge.  Musculoskeletal: Negative for back pain.  Skin: Negative for color change and rash.  Neurological: Negative for dizziness, tremors, syncope, speech difficulty, numbness and headaches.  Hematological: Does not bruise/bleed easily.  Psychiatric/Behavioral: Positive for agitation. The patient is nervous/anxious.     Allergies  Penicillins  Home Medications  No current outpatient prescriptions on file.  LMP 03/19/2011  Physical Exam  Constitutional: She is oriented to person, place, and time. She appears well-developed and well-nourished.  Non-toxic appearance. She does not have a sickly appearance. No distress.  HENT:  Head: Normocephalic and atraumatic.  Right Ear: External ear normal.  Left Ear: External ear normal.  Mouth/Throat: Oropharynx is clear and moist.  Eyes: Conjunctivae and EOM are normal. Pupils are equal, round, and reactive to light.  Neck: Normal range of motion. Neck supple.  Cardiovascular: Normal rate, regular rhythm, normal  heart sounds and intact distal pulses.  Exam reveals no gallop and no friction rub.   No murmur heard. Pulmonary/Chest: Effort normal and breath sounds normal. No respiratory distress. She has no wheezes.  Abdominal: Soft. Bowel sounds are normal. She exhibits no distension and no mass. There is tenderness. There is guarding.       Abdominal exam was unable to be performed thoroughly as patient was not cooperative  Musculoskeletal: Normal range of motion. She exhibits no edema and no tenderness.  Neurological: She is alert and oriented to person, place, and time. No cranial nerve deficit.  Skin: Skin is warm and dry. No rash noted. She is not diaphoretic.  Psychiatric: Her mood appears anxious. She is agitated.    ED Course  Procedures (including critical care time)  Labs Reviewed  CBC - Abnormal; Notable for the following:    RBC 5.67 (*)    MCV 72.8 (*)    MCH 23.1 (*)    RDW 15.7 (*)    Platelets 146 (*)    All other components within normal limits  COMPREHENSIVE METABOLIC PANEL - Abnormal; Notable for the following:    CO2 12 (*)    Glucose, Bld 381 (*)    Creatinine, Ser 0.43 (*)    AST 64 (*) SLIGHT HEMOLYSIS   ALT 66 (*)    All other components within normal limits  DIFFERENTIAL  LIPASE, BLOOD  URINALYSIS, ROUTINE W REFLEX MICROSCOPIC  POCT PREGNANCY, URINE   No results found.   No diagnosis found.    MDM  1:46 PM Patient seen and assessed. She was resting quietly when I went into the room, but quickly became tearful and began screaming when I began to talk with her. Will obtain labs, ua and reassess.  7:42 PM Patient has been resting comfortably in the room throughout the afternoon. Labs unremarkable. I do not have suspicion for cholecystitis or other acute abdominal pathology at this time. I do not think it is necessary to repeat imaging at this time. Patient's glucose has been elevated to about 300 throughout this visit, however she has historically had very  poor glucose control and a recent A1c was 15. She was strongly encouraged to make a followup appointment with primary care provider for further management of her diabetes, as well as her chronic abdominal pain. She will be discharged home with a prescription for Reglan.        Grant Fontana, [place] 04/25/11 1945

## 2011-04-25 NOTE — ED Notes (Signed)
Patient is resting comfortably.  Call light within reach.

## 2011-04-27 ENCOUNTER — Encounter (HOSPITAL_COMMUNITY): Payer: Self-pay | Admitting: *Deleted

## 2011-04-27 ENCOUNTER — Inpatient Hospital Stay (HOSPITAL_COMMUNITY)
Admission: EM | Admit: 2011-04-27 | Discharge: 2011-05-01 | DRG: 638 | Disposition: A | Discharge status: Home / Self Care | Payer: Medicaid Other | Attending: Family Medicine | Admitting: Family Medicine

## 2011-04-27 ENCOUNTER — Emergency Department (HOSPITAL_COMMUNITY): Payer: Medicaid Other

## 2011-04-27 ENCOUNTER — Other Ambulatory Visit: Payer: Self-pay

## 2011-04-27 DIAGNOSIS — E1110 Type 2 diabetes mellitus with ketoacidosis without coma: Secondary | ICD-10-CM | POA: Diagnosis present

## 2011-04-27 DIAGNOSIS — E876 Hypokalemia: Secondary | ICD-10-CM | POA: Diagnosis not present

## 2011-04-27 DIAGNOSIS — Z9119 Patient's noncompliance with other medical treatment and regimen: Secondary | ICD-10-CM

## 2011-04-27 DIAGNOSIS — E1010 Type 1 diabetes mellitus with ketoacidosis without coma: Secondary | ICD-10-CM

## 2011-04-27 DIAGNOSIS — R109 Unspecified abdominal pain: Secondary | ICD-10-CM | POA: Diagnosis present

## 2011-04-27 DIAGNOSIS — D509 Iron deficiency anemia, unspecified: Secondary | ICD-10-CM | POA: Diagnosis present

## 2011-04-27 DIAGNOSIS — M79606 Pain in leg, unspecified: Secondary | ICD-10-CM

## 2011-04-27 DIAGNOSIS — F3181 Bipolar II disorder: Secondary | ICD-10-CM | POA: Diagnosis present

## 2011-04-27 DIAGNOSIS — R110 Nausea: Secondary | ICD-10-CM | POA: Diagnosis present

## 2011-04-27 DIAGNOSIS — F17200 Nicotine dependence, unspecified, uncomplicated: Secondary | ICD-10-CM | POA: Diagnosis present

## 2011-04-27 DIAGNOSIS — R000 Tachycardia, unspecified: Secondary | ICD-10-CM | POA: Diagnosis present

## 2011-04-27 DIAGNOSIS — R079 Chest pain, unspecified: Secondary | ICD-10-CM | POA: Diagnosis present

## 2011-04-27 DIAGNOSIS — F3189 Other bipolar disorder: Secondary | ICD-10-CM | POA: Diagnosis present

## 2011-04-27 DIAGNOSIS — G40909 Epilepsy, unspecified, not intractable, without status epilepticus: Secondary | ICD-10-CM | POA: Diagnosis present

## 2011-04-27 DIAGNOSIS — Z91199 Patient's noncompliance with other medical treatment and regimen due to unspecified reason: Secondary | ICD-10-CM

## 2011-04-27 DIAGNOSIS — Z794 Long term (current) use of insulin: Secondary | ICD-10-CM

## 2011-04-27 DIAGNOSIS — B182 Chronic viral hepatitis C: Secondary | ICD-10-CM

## 2011-04-27 DIAGNOSIS — R8271 Bacteriuria: Secondary | ICD-10-CM | POA: Diagnosis present

## 2011-04-27 DIAGNOSIS — R82998 Other abnormal findings in urine: Secondary | ICD-10-CM | POA: Diagnosis present

## 2011-04-27 DIAGNOSIS — E1310 Other specified diabetes mellitus with ketoacidosis without coma: Principal | ICD-10-CM | POA: Diagnosis present

## 2011-04-27 DIAGNOSIS — K7460 Unspecified cirrhosis of liver: Secondary | ICD-10-CM

## 2011-04-27 DIAGNOSIS — B1920 Unspecified viral hepatitis C without hepatic coma: Secondary | ICD-10-CM | POA: Diagnosis present

## 2011-04-27 DIAGNOSIS — E119 Type 2 diabetes mellitus without complications: Secondary | ICD-10-CM

## 2011-04-27 HISTORY — DX: Major depressive disorder, single episode, unspecified: F32.9

## 2011-04-27 HISTORY — DX: Depression, unspecified: F32.A

## 2011-04-27 LAB — URINALYSIS, ROUTINE W REFLEX MICROSCOPIC
Bilirubin Urine: NEGATIVE
Leukocytes, UA: NEGATIVE
Nitrite: NEGATIVE
Specific Gravity, Urine: 1.025 (ref 1.005–1.030)
Urobilinogen, UA: 0.2 mg/dL (ref 0.0–1.0)
pH: 5 (ref 5.0–8.0)

## 2011-04-27 LAB — BLOOD GAS, VENOUS
Acid-base deficit: 22.5 mmol/L — ABNORMAL HIGH (ref 0.0–2.0)
Bicarbonate: 6.8 mEq/L — ABNORMAL LOW (ref 20.0–24.0)
TCO2: 6.6 mmol/L (ref 0–100)
pCO2, Ven: 23.2 mmHg — ABNORMAL LOW (ref 45.0–50.0)
pH, Ven: 7.096 — CL (ref 7.250–7.300)

## 2011-04-27 LAB — COMPREHENSIVE METABOLIC PANEL
ALT: 74 U/L — ABNORMAL HIGH (ref 0–35)
Albumin: 4.8 g/dL (ref 3.5–5.2)
Alkaline Phosphatase: 110 U/L (ref 39–117)
BUN: 21 mg/dL (ref 6–23)
Chloride: 95 mEq/L — ABNORMAL LOW (ref 96–112)
GFR calc Af Amer: >90 mL/min (ref >90)
Glucose, Bld: 387 mg/dL — ABNORMAL HIGH (ref 70–99)
Potassium: 4.2 mEq/L (ref 3.5–5.1)
Sodium: 134 mEq/L — ABNORMAL LOW (ref 135–145)
Total Bilirubin: 0.3 mg/dL (ref 0.3–1.2)
Total Protein: 9.1 g/dL — ABNORMAL HIGH (ref 6.0–8.3)

## 2011-04-27 LAB — CBC
Hemoglobin: 15.1 g/dL — ABNORMAL HIGH (ref 12.0–15.0)
MCH: 23.4 pg — ABNORMAL LOW (ref 26.0–34.0)
Platelets: 179 10*3/uL (ref 150–400)
RBC: 6.46 MIL/uL — ABNORMAL HIGH (ref 3.87–5.11)
WBC: 13.2 10*3/uL — ABNORMAL HIGH (ref 4.0–10.5)

## 2011-04-27 LAB — DIFFERENTIAL
Eosinophils Absolute: 0 10*3/uL (ref 0.0–0.7)
Lymphocytes Relative: 20 % (ref 12–46)
Lymphs Abs: 2.7 10*3/uL (ref 0.7–4.0)
Monocytes Relative: 7 % (ref 3–12)
Neutro Abs: 9.6 10*3/uL — ABNORMAL HIGH (ref 1.7–7.7)
Neutrophils Relative %: 73 % (ref 43–77)

## 2011-04-27 LAB — GLUCOSE, CAPILLARY: Glucose-Capillary: 396 mg/dL — ABNORMAL HIGH (ref 70–99)

## 2011-04-27 LAB — D-DIMER, QUANTITATIVE: D-Dimer, Quant: 1.56 ug/mL-FEU — ABNORMAL HIGH (ref 0.00–0.48)

## 2011-04-27 MED ORDER — SODIUM CHLORIDE 0.9 % IV SOLN: Freq: Once | INTRAVENOUS | Status: DC

## 2011-04-27 MED ORDER — SODIUM CHLORIDE 0.9 % IV BOLUS (SEPSIS)
1000.0000 mL | Freq: Once | INTRAVENOUS | Status: AC
  Administered 2011-04-27: 1000 mL via INTRAVENOUS

## 2011-04-27 MED ORDER — HYDROMORPHONE HCL PF 1 MG/ML IJ SOLN
1.0000 mg | Freq: Once | INTRAMUSCULAR | Status: AC
  Administered 2011-04-27: 1 mg via INTRAVENOUS
  Filled 2011-04-27: qty 1

## 2011-04-27 MED ORDER — ONDANSETRON HCL 4 MG/2ML IJ SOLN
4.0000 mg | Freq: Once | INTRAMUSCULAR | Status: AC
  Administered 2011-04-27: 4 mg via INTRAVENOUS
  Filled 2011-04-27: qty 2

## 2011-04-27 MED ORDER — INSULIN REGULAR HUMAN 100 UNIT/ML IJ SOLN
5.0000 [IU] | Freq: Once | INTRAMUSCULAR | Status: AC
  Administered 2011-04-27: 5 [IU] via INTRAVENOUS
  Filled 2011-04-27: qty 0.05

## 2011-04-27 NOTE — ED Notes (Addendum)
PIV attempts x 4 unsuccessful

## 2011-04-27 NOTE — ED Notes (Signed)
IV team bedside. 

## 2011-04-27 NOTE — ED Provider Notes (Signed)
History     CSN: [id number] Arrival date & time: 04/27/2011  8:05 PM   First MD Initiated Contact with Patient 04/27/11 2155      Chief Complaint  Patient presents with  . Abdominal Pain    (Consider location/radiation/quality/duration/timing/severity/associated sxs/prior treatment) Patient is a 40 y.o. female presenting with abdominal pain. The history is provided by the patient.  Abdominal Pain The primary symptoms of the illness include abdominal pain.  She is complaining of sharp pains in her chest and abdomen and burning pains in her legs. She initially stated that these pains just started today, but when advised that she had an ED visit several days ago with complaints of abdominal pain, and she states that the pain today is essentially the same pain that she's been having only worse. She states the pain is severe, rating it at 10 out of 10. Pain is worse with any movement or with palpation. Nothing makes it any better. She denies any fever or chills. She has had nausea. This is a history of GERD and thinks that some of the symptoms might be related to a flare up of her GERD.  Past Medical History  Diagnosis Date  . Diabetes mellitus   . Hep C w/o coma, chronic   . Kidney stone   . Seizure   . Gastritis     History reviewed. No pertinent past surgical history.  History reviewed. No pertinent family history.  History  Substance Use Topics  . Smoking status: Current Everyday Smoker  . Smokeless tobacco: Not on file  . Alcohol Use:     OB History    Grav Para Term Preterm Abortions TAB SAB Ect Mult Living                  Review of Systems  Gastrointestinal: Positive for abdominal pain.  All other systems reviewed and are negative.    Allergies  Penicillins  Home Medications   Current Outpatient Rx  Name Route Sig Dispense Refill  . ASPIRIN EC 81 MG PO TBEC Oral Take 81 mg by mouth daily.      Marland Kitchen LITHIUM CARBONATE 300 MG PO TBCR Oral Take 300 mg by mouth  3 (three) times daily.      Marland Kitchen MAGNESIUM GLUCONATE 500 MG PO TABS Oral Take 500 mg by mouth 2 (two) times daily.      Marland Kitchen METOCLOPRAMIDE HCL 10 MG PO TABS Oral Take 1 tablet (10 mg total) by mouth every 6 (six) hours. 30 tablet 0  . PANTOPRAZOLE SODIUM 40 MG PO TBEC Oral Take 40 mg by mouth daily.      . INSULIN ASPART 100 UNIT/ML Coker SOLN Subcutaneous Inject into the skin.      . INSULIN GLARGINE 100 UNIT/ML Armour SOLN Subcutaneous Inject into the skin.        BP 145/92  Pulse 104  Resp 17  SpO2 100%  LMP 03/19/2011  Physical Exam  Nursing note and vitals reviewed.  40 year old female who appears uncomfortable, somewhat anxious, and somewhat cachectic. Vital signs are significant for tachycardia of 118. Head is normocephalic and atraumatic. PERRLA A., EOMI. Oropharynx is moderately dry. Neck is supple without adenopathy. Lungs are clear without rales wheezes or rhonchi. Back is nontender. Chest is nontender. Heart is tachycardic without murmur. Abdomen is soft flat with mild tenderness diffusely. Bowel sounds are diminished. Extremities have no cyanosis or edema and full range of motion is present. Neurologic: Mental status is normal, cranial nerves are  intact, and there are no focal motor or sensory deficits.  ED Course  CRITICAL CARE Performed by: Dione Booze Authorized by: Preston Fleeting, Pedro Whiters Total critical care time: 75 minutes Critical care was necessary to treat or prevent imminent or life-threatening deterioration of the following conditions: circulatory failure, endocrine crisis, dehydration and metabolic crisis. Critical care was time spent personally by me on the following activities: development of treatment plan with patient or surrogate, discussions with consultants, evaluation of patient's response to treatment, examination of patient, obtaining history from patient or surrogate, ordering and performing treatments and interventions, ordering and review of laboratory studies, ordering and  review of radiographic studies, pulse oximetry, re-evaluation of patient's condition and review of old charts.   (including critical care time)  Results for orders placed during the hospital encounter of 04/27/11  GLUCOSE, CAPILLARY      Component Value Range   Glucose-Capillary 396 (*) 70 - 99 (mg/dL)  URINALYSIS, ROUTINE W REFLEX MICROSCOPIC      Component Value Range   Color, Urine YELLOW  YELLOW    Appearance CLOUDY (*) CLEAR    Specific Gravity, Urine 1.025  1.005 - 1.030    pH 5.0  5.0 - 8.0    Glucose, UA >1000 (*) NEGATIVE (mg/dL)   Hgb urine dipstick MODERATE (*) NEGATIVE    Bilirubin Urine NEGATIVE  NEGATIVE    Ketones, ur >80 (*) NEGATIVE (mg/dL)   Protein, ur 30 (*) NEGATIVE (mg/dL)   Urobilinogen, UA 0.2  0.0 - 1.0 (mg/dL)   Nitrite NEGATIVE  NEGATIVE    Leukocytes, UA NEGATIVE  NEGATIVE   CBC      Component Value Range   WBC 13.2 (*) 4.0 - 10.5 (K/uL)   RBC 6.46 (*) 3.87 - 5.11 (MIL/uL)   Hemoglobin 15.1 (*) 12.0 - 15.0 (g/dL)   HCT 09.8 (*) 11.9 - 46.0 (%)   MCV 74.5 (*) 78.0 - 100.0 (fL)   MCH 23.4 (*) 26.0 - 34.0 (pg)   MCHC 31.4  30.0 - 36.0 (g/dL)   RDW 14.7 (*) 82.9 - 15.5 (%)   Platelets 179  150 - 400 (K/uL)  DIFFERENTIAL      Component Value Range   Neutrophils Relative 73  43 - 77 (%)   Neutro Abs 9.6 (*) 1.7 - 7.7 (K/uL)   Lymphocytes Relative 20  12 - 46 (%)   Lymphs Abs 2.7  0.7 - 4.0 (K/uL)   Monocytes Relative 7  3 - 12 (%)   Monocytes Absolute 0.9  0.1 - 1.0 (K/uL)   Eosinophils Relative 0  0 - 5 (%)   Eosinophils Absolute 0.0  0.0 - 0.7 (K/uL)   Basophils Relative 0  0 - 1 (%)   Basophils Absolute 0.0  0.0 - 0.1 (K/uL)  COMPREHENSIVE METABOLIC PANEL      Component Value Range   Sodium 134 (*) 135 - 145 (mEq/L)   Potassium 4.2  3.5 - 5.1 (mEq/L)   Chloride 95 (*) 96 - 112 (mEq/L)   CO2 6 (*) 19 - 32 (mEq/L)   Glucose, Bld 387 (*) 70 - 99 (mg/dL)   BUN 21  6 - 23 (mg/dL)   Creatinine, Ser 5.62  0.50 - 1.10 (mg/dL)   Calcium 9.7  8.4 -  10.5 (mg/dL)   Total Protein 9.1 (*) 6.0 - 8.3 (g/dL)   Albumin 4.8  3.5 - 5.2 (g/dL)   AST 66 (*) 0 - 37 (U/L)   ALT 74 (*) 0 - 35 (U/L)  Alkaline Phosphatase 110  39 - 117 (U/L)   Total Bilirubin 0.3  0.3 - 1.2 (mg/dL)   GFR calc non Af Amer >90  >90 (mL/min)   GFR calc Af Amer >90  >90 (mL/min)  URINE MICROSCOPIC-ADD ON      Component Value Range   Squamous Epithelial / LPF MANY (*) RARE    RBC / HPF 0-2  <3 (RBC/hpf)   Bacteria, UA MANY (*) RARE   BLOOD GAS, VENOUS      Component Value Range   pH, Ven 7.096 (*) 7.250 - 7.300    pCO2, Ven 23.2 (*) 45.0 - 50.0 (mmHg)   pO2, Ven 32.5  30.0 - 45.0 (mmHg)   Bicarbonate 6.8 (*) 20.0 - 24.0 (mEq/L)   TCO2 6.6  0 - 100 (mmol/L)   Acid-base deficit 22.5 (*) 0.0 - 2.0 (mmol/L)   O2 Saturation 55.3     Patient temperature 98.6     Collection site COLLECTED BY NURSE     Drawn by [phone]     Sample type VEIN    D-DIMER, QUANTITATIVE      Component Value Range   D-Dimer, Quant 1.56 (*) 0.00 - 0.48 (ug/mL-FEU)   Dg Chest Portable 1 View  04/27/2011  *RADIOLOGY REPORT*  Clinical Data: Chest pain.  PORTABLE CHEST - 1 VIEW  Comparison: Chest x-ray 03/13/2011.  Findings: The cardiac silhouette, mediastinal and hilar contours are within normal limits and stable.  The lungs are clear.  The bony thorax is intact.  IMPRESSION: No acute cardiopulmonary findings.  Original Report Authenticated By: P. Loralie Champagne, M.D.   Dg Abd Acute W/chest  04/25/2011  *RADIOLOGY REPORT*  Clinical Data: Epigastric pain, vomiting  ACUTE ABDOMEN SERIES (ABDOMEN 2 VIEW & CHEST 1 VIEW)  Comparison: 04/09/2011, 03/26/2011  Findings: Normal heart size and vascularity.  Scoliosis of the spine evident.  Negative for pneumonia, collapse, consolidation, effusion, or pneumothorax.  No free air evident.  Relative paucity of bowel gas.  Retained stool throughout the colon compatible with a degree of constipation.  Negative for obstruction or ileus.  No acute osseous finding.   IMPRESSION: No acute chest or abdominal process.  Retained stool throughout the colon compatible with constipation.  Original Report Authenticated By: Judie Petit. Ruel Favors, M.D.   Dg Abd Acute W/chest  04/09/2011  *RADIOLOGY REPORT*  Clinical Data: Upper abdominal pain and nausea.  History of hepatitis C.  ACUTE ABDOMEN SERIES (ABDOMEN 2 VIEW & CHEST 1 VIEW)  Comparison: Chest and abdominal radiographs performed 03/26/2011, and CT of the abdomen and pelvis performed 03/26/2011  Findings: The lungs are well-aerated and clear.  There is no evidence of focal opacification, pleural effusion or pneumothorax. The cardiomediastinal silhouette is within normal limits.  The visualized bowel gas pattern is unremarkable. Stool and air are noted throughout the colon; there is no evidence of small bowel dilatation to suggest obstruction.  No free intra-abdominal air is identified on the provided upright view.  No acute osseous abnormalities are seen; the sacroiliac joints are unremarkable in appearance.  Mild right convex thoracic scoliosis and mild left convex thoracolumbar scoliosis are noted.  IMPRESSION:  1.  Unremarkable bowel gas pattern; no free intra-abdominal air seen. 2.  No acute cardiopulmonary process identified. 3.  Mild right convex thoracic scoliosis and mild left convex thoracolumbar scoliosis noted.  Original Report Authenticated By: Tonia Ghent, M.D.     She was given IV fluids with heart rate coming down. She was given Dilaudid with Zofran with  good relief of pain. Laboratory workup showed evidence of ketoacidosis with an elevated anion gap of 31. She was given insulin IV and arrangements have been made for hospital admission. Tried hospitalist has been consulted and arrangements are made to admit the patient to the step down unit.   MDM  Diffuse pain etiology unclear. Pleuritic chest pain with tachycardia, need to rule out pulmonary embolism.        Dione Booze, MD 04/27/11 [phone]

## 2011-04-27 NOTE — ED Notes (Signed)
Pt's level of consciousness altered. Pt not rousable to PIV sticks x 4. Unsuccessful IV start attempts x 4. IV team paged for IV start and lab draw.

## 2011-04-27 NOTE — ED Notes (Signed)
Per EMS pt c/o abd pain; states has hx of ulcer; on ems arrival pt uncooperative/acting out/ c/o severe pain ; states was treated yesterday for same but cant afford meds; once en route pt went to sleep; calm and resting with eyes closed on arrival to ED;

## 2011-04-27 NOTE — ED Notes (Signed)
Per ems cbg [phone]

## 2011-04-28 ENCOUNTER — Encounter (HOSPITAL_COMMUNITY): Payer: Self-pay | Admitting: Family Medicine

## 2011-04-28 DIAGNOSIS — G40909 Epilepsy, unspecified, not intractable, without status epilepticus: Secondary | ICD-10-CM | POA: Diagnosis present

## 2011-04-28 DIAGNOSIS — R8271 Bacteriuria: Secondary | ICD-10-CM | POA: Diagnosis present

## 2011-04-28 LAB — GLUCOSE, CAPILLARY
Glucose-Capillary: 123 mg/dL — ABNORMAL HIGH (ref 70–99)
Glucose-Capillary: 137 mg/dL — ABNORMAL HIGH (ref 70–99)
Glucose-Capillary: 159 mg/dL — ABNORMAL HIGH (ref 70–99)
Glucose-Capillary: 178 mg/dL — ABNORMAL HIGH (ref 70–99)
Glucose-Capillary: 189 mg/dL — ABNORMAL HIGH (ref 70–99)
Glucose-Capillary: 245 mg/dL — ABNORMAL HIGH (ref 70–99)
Glucose-Capillary: 261 mg/dL — ABNORMAL HIGH (ref 70–99)
Glucose-Capillary: 272 mg/dL — ABNORMAL HIGH (ref 70–99)
Glucose-Capillary: 98 mg/dL (ref 70–99)

## 2011-04-28 LAB — MRSA PCR SCREENING: MRSA by PCR: NEGATIVE

## 2011-04-28 LAB — CBC
HCT: 41.1 % (ref 36.0–46.0)
Hemoglobin: 13 g/dL (ref 12.0–15.0)
MCH: 23.3 pg — ABNORMAL LOW (ref 26.0–34.0)
MCHC: 31.6 g/dL (ref 30.0–36.0)
MCHC: 31.9 g/dL (ref 30.0–36.0)
MCV: 73 fL — ABNORMAL LOW (ref 78.0–100.0)
Platelets: 161 10*3/uL (ref 150–400)
RDW: 17.1 % — ABNORMAL HIGH (ref 11.5–15.5)
RDW: 16.9 % — ABNORMAL HIGH (ref 11.5–15.5)
WBC: 16.9 10*3/uL — ABNORMAL HIGH (ref 4.0–10.5)

## 2011-04-28 LAB — BASIC METABOLIC PANEL
BUN: 18 mg/dL (ref 6–23)
BUN: 17 mg/dL (ref 6–23)
BUN: 17 mg/dL (ref 6–23)
CO2: 9 mEq/L — CL (ref 19–32)
CO2: 14 mEq/L — ABNORMAL LOW (ref 19–32)
Calcium: 8.8 mg/dL (ref 8.4–10.5)
Calcium: 9 mg/dL (ref 8.4–10.5)
Calcium: 9.2 mg/dL (ref 8.4–10.5)
Calcium: 9.3 mg/dL (ref 8.4–10.5)
Chloride: 104 mEq/L (ref 96–112)
Creatinine, Ser: 0.53 mg/dL (ref 0.50–1.10)
Creatinine, Ser: 0.5 mg/dL (ref 0.50–1.10)
Creatinine, Ser: 0.53 mg/dL (ref 0.50–1.10)
Creatinine, Ser: 0.49 mg/dL — ABNORMAL LOW (ref 0.50–1.10)
GFR calc Af Amer: >90 mL/min (ref >90)
GFR calc Af Amer: >90 mL/min (ref >90)
GFR calc Af Amer: >90 mL/min (ref >90)
GFR calc Af Amer: >90 mL/min (ref >90)
GFR calc non Af Amer: >90 mL/min (ref >90)
GFR calc non Af Amer: >90 mL/min (ref >90)
GFR calc non Af Amer: >90 mL/min (ref >90)
GFR calc non Af Amer: >90 mL/min (ref >90)
GFR calc non Af Amer: >90 mL/min (ref >90)
Glucose, Bld: 130 mg/dL — ABNORMAL HIGH (ref 70–99)
Glucose, Bld: 150 mg/dL — ABNORMAL HIGH (ref 70–99)
Potassium: 3.5 mEq/L (ref 3.5–5.1)
Potassium: 4.1 mEq/L (ref 3.5–5.1)
Sodium: 133 mEq/L — ABNORMAL LOW (ref 135–145)
Sodium: 134 mEq/L — ABNORMAL LOW (ref 135–145)

## 2011-04-28 LAB — HEMOGLOBIN A1C
Hgb A1c MFr Bld: 14.8 % — ABNORMAL HIGH (ref <5.7)
Mean Plasma Glucose: 378 mg/dL — ABNORMAL HIGH (ref <117)

## 2011-04-28 MED ORDER — INSULIN REGULAR HUMAN 100 UNIT/ML IJ SOLN
INTRAMUSCULAR | Status: DC
  Administered 2011-04-28: 0.7 [IU]/h via INTRAVENOUS
  Administered 2011-04-28: 1.9 [IU]/h via INTRAVENOUS
  Administered 2011-04-28: 2 [IU]/h via INTRAVENOUS
  Administered 2011-04-28 (×2): 1.7 [IU]/h via INTRAVENOUS
  Administered 2011-04-28: 2.7 [IU]/h via INTRAVENOUS
  Administered 2011-04-28: 1.5 [IU]/h via INTRAVENOUS
  Administered 2011-04-28: 2.4 [IU]/h via INTRAVENOUS
  Filled 2011-04-28: qty 1

## 2011-04-28 MED ORDER — ACETAMINOPHEN 325 MG PO TABS: 650.0000 mg | ORAL_TABLET | Freq: Four times a day (QID) | ORAL | Status: DC | PRN

## 2011-04-28 MED ORDER — DEXTROSE 50 % IV SOLN: 25.0000 mL | INTRAVENOUS | Status: DC | PRN

## 2011-04-28 MED ORDER — ACETAMINOPHEN 650 MG RE SUPP: 650.0000 mg | Freq: Four times a day (QID) | RECTAL | Status: DC | PRN

## 2011-04-28 MED ORDER — POTASSIUM CHLORIDE 10 MEQ/100ML IV SOLN
10.0000 meq | INTRAVENOUS | Status: AC
  Administered 2011-04-28 (×4): 10 meq via INTRAVENOUS
  Filled 2011-04-28 (×3): qty 100

## 2011-04-28 MED ORDER — DEXTROSE-NACL 5-0.45 % IV SOLN
INTRAVENOUS | Status: DC
  Administered 2011-04-28 – 2011-04-29 (×2): via INTRAVENOUS

## 2011-04-28 MED ORDER — ENOXAPARIN SODIUM 40 MG/0.4ML ~~LOC~~ SOLN
40.0000 mg | SUBCUTANEOUS | Status: DC
  Administered 2011-04-28 – 2011-05-01 (×4): 40 mg via SUBCUTANEOUS
  Filled 2011-04-28 (×5): qty 0.4

## 2011-04-28 MED ORDER — MAGNESIUM GLUCONATE 500 MG PO TABS
500.0000 mg | ORAL_TABLET | Freq: Two times a day (BID) | ORAL | Status: DC
  Administered 2011-04-28 (×2): 500 mg via ORAL
  Filled 2011-04-28 (×3): qty 1

## 2011-04-28 MED ORDER — SODIUM CHLORIDE 0.9 % IV SOLN
INTRAVENOUS | Status: DC
  Administered 2011-04-28 – 2011-05-01 (×10): via INTRAVENOUS

## 2011-04-28 MED ORDER — POTASSIUM CHLORIDE 10 MEQ/100ML IV SOLN
10.0000 meq | INTRAVENOUS | Status: AC
  Administered 2011-04-28 (×2): 10 meq via INTRAVENOUS
  Filled 2011-04-28: qty 200

## 2011-04-28 MED ORDER — PANTOPRAZOLE SODIUM 40 MG IV SOLR
40.0000 mg | INTRAVENOUS | Status: DC
  Administered 2011-04-28 – 2011-05-01 (×4): 40 mg via INTRAVENOUS
  Filled 2011-04-28 (×4): qty 40

## 2011-04-28 MED ORDER — MORPHINE SULFATE 2 MG/ML IJ SOLN
2.0000 mg | INTRAMUSCULAR | Status: DC | PRN
  Administered 2011-04-28: 2 mg via INTRAVENOUS
  Filled 2011-04-28: qty 1

## 2011-04-28 MED ORDER — MORPHINE SULFATE 2 MG/ML IJ SOLN
2.0000 mg | INTRAMUSCULAR | Status: DC | PRN
  Administered 2011-04-29: 2 mg via INTRAVENOUS
  Filled 2011-04-28: qty 1

## 2011-04-28 MED ORDER — ONDANSETRON HCL 4 MG/2ML IJ SOLN
INTRAMUSCULAR | Status: AC
  Filled 2011-04-28: qty 2

## 2011-04-28 MED ORDER — POTASSIUM CHLORIDE 10 MEQ/100ML IV SOLN
INTRAVENOUS | Status: AC
  Administered 2011-04-28: 10 meq via INTRAVENOUS
  Filled 2011-04-28: qty 100

## 2011-04-28 MED ORDER — MORPHINE SULFATE 4 MG/ML IJ SOLN
INTRAMUSCULAR | Status: AC
  Administered 2011-04-28: 2 mg
  Filled 2011-04-28: qty 1

## 2011-04-28 MED ORDER — ONDANSETRON HCL 4 MG/2ML IJ SOLN
4.0000 mg | Freq: Four times a day (QID) | INTRAMUSCULAR | Status: DC | PRN
Start: 2011-04-28 — End: 2011-05-01
  Administered 2011-04-28 – 2011-04-30 (×4): 4 mg via INTRAVENOUS
  Filled 2011-04-28 (×3): qty 2

## 2011-04-28 MED ORDER — METOCLOPRAMIDE HCL 5 MG/ML IJ SOLN
10.0000 mg | Freq: Three times a day (TID) | INTRAMUSCULAR | Status: DC
  Administered 2011-04-28 – 2011-05-01 (×10): 10 mg via INTRAVENOUS
  Filled 2011-04-28 (×13): qty 2

## 2011-04-28 MED ORDER — HYDROCODONE-ACETAMINOPHEN 5-325 MG PO TABS
1.0000 | ORAL_TABLET | ORAL | Status: DC | PRN
  Administered 2011-04-28: 2 via ORAL
  Administered 2011-04-28: 1 via ORAL
  Administered 2011-04-29 – 2011-04-30 (×5): 2 via ORAL
  Filled 2011-04-28: qty 1
  Filled 2011-04-28 (×6): qty 2

## 2011-04-28 MED ORDER — LITHIUM CARBONATE ER 300 MG PO TBCR
300.0000 mg | EXTENDED_RELEASE_TABLET | Freq: Three times a day (TID) | ORAL | Status: DC
  Administered 2011-04-28 – 2011-05-01 (×10): 300 mg via ORAL
  Filled 2011-04-28 (×12): qty 1

## 2011-04-28 MED ORDER — SODIUM CHLORIDE 0.9 % IV SOLN: INTRAVENOUS | Status: AC

## 2011-04-28 MED ORDER — INSULIN GLARGINE 100 UNIT/ML ~~LOC~~ SOLN
15.0000 [IU] | Freq: Every day | SUBCUTANEOUS | Status: DC
  Filled 2011-04-28: qty 3

## 2011-04-28 NOTE — Progress Notes (Signed)
Report given to [facility]  She is now taking over care of patient.

## 2011-04-28 NOTE — Progress Notes (Signed)
Interval History: [name] is a 40 year old female with a past medical history of diabetes and medical monitor appearance presented to the hospital with a chief complaint of abdominal pain. Upon initial evaluation, she was found to be in DKA. She is currently in the ICU on an insulin drip. ROS: [name] continues to complain of feeling unwell. She is nauseated and has ongoing abdominal pain. She denies any dysuria, hematuria, or chest pain. She is not coughing nor is she short of breath.   Objective: Vital signs in last 24 hours: Temp:  [97.6 F (36.4 C)-98.5 F (36.9 C)] 98.1 F (36.7 C) (11/11 0800) Pulse Rate:  [71-118] 71  (11/11 1000) Resp:  [12-22] 13  (11/11 1000) BP: (92-154)/(53-94) 112/61 mmHg (11/11 1000) SpO2:  [100 %] 100 % (11/11 1000) Weight:  [49 kg (108 lb 0.4 oz)] 108 lb 0.4 oz (49 kg) (11/11 0100) Weight change:  Last BM Date: 04/23/11  Intake/Output from previous day: 11/10 0701 - 11/11 0700 In: 200 [IV Piggyback:200] Out: 825 [Urine:825] Total I/O In: 885 [I.V.:885] Out: -  CBG (last 3)   Basename 04/28/11 1024 04/28/11 0935 04/28/11 0830  GLUCAP 178* 206* 245*      Physical Exam: Gen: No acute distress. Cardiovascular: Mildly tachycardic. Otherwise no murmurs, rubs, or gallops. Respiratory: Lungs clear to auscultation bilaterally with good air movement. Gastrointestinal: Soft, no guarding or rebound. Nontender. Bowel sounds present. Extremities: No clubbing, edema, or cyanosis. 2+ dorsalis pedis pulses.   Lab Results: Results for orders placed during the hospital encounter of 04/27/11 (from the past 24 hour(s))  GLUCOSE, CAPILLARY     Status: Abnormal   Collection Time   04/27/11  8:14 PM      Component Value Range   Glucose-Capillary 396 (*) 70 - 99 (mg/dL)  CBC     Status: Abnormal   Collection Time   04/27/11  8:23 PM      Component Value Range   WBC 13.2 (*) 4.0 - 10.5 (K/uL)   RBC 6.46 (*) 3.87 - 5.11 (MIL/uL)   Hemoglobin 15.1 (*) 12.0  - 15.0 (g/dL)   HCT 40.9 (*) 81.1 - 46.0 (%)   MCV 74.5 (*) 78.0 - 100.0 (fL)   MCH 23.4 (*) 26.0 - 34.0 (pg)   MCHC 31.4  30.0 - 36.0 (g/dL)   RDW 91.4 (*) 78.2 - 15.5 (%)   Platelets 179  150 - 400 (K/uL)  DIFFERENTIAL     Status: Abnormal   Collection Time   04/27/11  8:23 PM      Component Value Range   Neutrophils Relative 73  43 - 77 (%)   Neutro Abs 9.6 (*) 1.7 - 7.7 (K/uL)   Lymphocytes Relative 20  12 - 46 (%)   Lymphs Abs 2.7  0.7 - 4.0 (K/uL)   Monocytes Relative 7  3 - 12 (%)   Monocytes Absolute 0.9  0.1 - 1.0 (K/uL)   Eosinophils Relative 0  0 - 5 (%)   Eosinophils Absolute 0.0  0.0 - 0.7 (K/uL)   Basophils Relative 0  0 - 1 (%)   Basophils Absolute 0.0  0.0 - 0.1 (K/uL)  COMPREHENSIVE METABOLIC PANEL     Status: Abnormal   Collection Time   04/27/11  8:23 PM      Component Value Range   Sodium 134 (*) 135 - 145 (mEq/L)   Potassium 4.2  3.5 - 5.1 (mEq/L)   Chloride 95 (*) 96 - 112 (mEq/L)   CO2  6 (*) 19 - 32 (mEq/L)   Glucose, Bld 387 (*) 70 - 99 (mg/dL)   BUN 21  6 - 23 (mg/dL)   Creatinine, Ser 1.61  0.50 - 1.10 (mg/dL)   Calcium 9.7  8.4 - 09.6 (mg/dL)   Total Protein 9.1 (*) 6.0 - 8.3 (g/dL)   Albumin 4.8  3.5 - 5.2 (g/dL)   AST 66 (*) 0 - 37 (U/L)   ALT 74 (*) 0 - 35 (U/L)   Alkaline Phosphatase 110  39 - 117 (U/L)   Total Bilirubin 0.3  0.3 - 1.2 (mg/dL)   GFR calc non Af Amer >90  >90 (mL/min)   GFR calc Af Amer >90  >90 (mL/min)  D-DIMER, QUANTITATIVE     Status: Abnormal   Collection Time   04/27/11  8:23 PM      Component Value Range   D-Dimer, Quant 1.56 (*) 0.00 - 0.48 (ug/mL-FEU)  URINALYSIS, ROUTINE W REFLEX MICROSCOPIC     Status: Abnormal   Collection Time   04/27/11  8:48 PM      Component Value Range   Color, Urine YELLOW  YELLOW    Appearance CLOUDY (*) CLEAR    Specific Gravity, Urine 1.025  1.005 - 1.030    pH 5.0  5.0 - 8.0    Glucose, UA >1000 (*) NEGATIVE (mg/dL)   Hgb urine dipstick MODERATE (*) NEGATIVE    Bilirubin Urine  NEGATIVE  NEGATIVE    Ketones, ur >80 (*) NEGATIVE (mg/dL)   Protein, ur 30 (*) NEGATIVE (mg/dL)   Urobilinogen, UA 0.2  0.0 - 1.0 (mg/dL)   Nitrite NEGATIVE  NEGATIVE    Leukocytes, UA NEGATIVE  NEGATIVE   URINE MICROSCOPIC-ADD ON     Status: Abnormal   Collection Time   04/27/11  8:48 PM      Component Value Range   Squamous Epithelial / LPF MANY (*) RARE    RBC / HPF 0-2  <3 (RBC/hpf)   Bacteria, UA MANY (*) RARE   BLOOD GAS, VENOUS     Status: Abnormal   Collection Time   04/27/11  9:48 PM      Component Value Range   pH, Ven 7.096 (*) 7.250 - 7.300    pCO2, Ven 23.2 (*) 45.0 - 50.0 (mmHg)   pO2, Ven 32.5  30.0 - 45.0 (mmHg)   Bicarbonate 6.8 (*) 20.0 - 24.0 (mEq/L)   TCO2 6.6  0 - 100 (mmol/L)   Acid-base deficit 22.5 (*) 0.0 - 2.0 (mmol/L)   O2 Saturation 55.3     Patient temperature 98.6     Collection site COLLECTED BY NURSE     Drawn by [id number]     Sample type VEIN    GLUCOSE, CAPILLARY     Status: Abnormal   Collection Time   04/28/11 12:00 AM      Component Value Range   Glucose-Capillary 261 (*) 70 - 99 (mg/dL)  MRSA PCR SCREENING     Status: Normal   Collection Time   04/28/11  1:54 AM      Component Value Range   MRSA by PCR NEGATIVE  NEGATIVE   GLUCOSE, CAPILLARY     Status: Abnormal   Collection Time   04/28/11  2:01 AM      Component Value Range   Glucose-Capillary 225 (*) 70 - 99 (mg/dL)  BASIC METABOLIC PANEL     Status: Abnormal   Collection Time   04/28/11  2:50 AM  Component Value Range   Sodium 133 (*) 135 - 145 (mEq/L)   Potassium 3.5  3.5 - 5.1 (mEq/L)   Chloride 102  96 - 112 (mEq/L)   CO2 8 (*) 19 - 32 (mEq/L)   Glucose, Bld 244 (*) 70 - 99 (mg/dL)   BUN 18  6 - 23 (mg/dL)   Creatinine, Ser 1.61  0.50 - 1.10 (mg/dL)   Calcium 8.8  8.4 - 09.6 (mg/dL)   GFR calc non Af Amer >90  >90 (mL/min)   GFR calc Af Amer >90  >90 (mL/min)  CBC     Status: Abnormal   Collection Time   04/28/11  2:50 AM      Component Value Range   WBC 16.9  (*) 4.0 - 10.5 (K/uL)   RBC 5.50 (*) 3.87 - 5.11 (MIL/uL)   Hemoglobin 13.0  12.0 - 15.0 (g/dL)   HCT 04.5  40.9 - 81.1 (%)   MCV 74.7 (*) 78.0 - 100.0 (fL)   MCH 23.6 (*) 26.0 - 34.0 (pg)   MCHC 31.6  30.0 - 36.0 (g/dL)   RDW 91.4 (*) 78.2 - 15.5 (%)   Platelets 156  150 - 400 (K/uL)  GLUCOSE, CAPILLARY     Status: Abnormal   Collection Time   04/28/11  3:06 AM      Component Value Range   Glucose-Capillary 272 (*) 70 - 99 (mg/dL)   Comment 1 Documented in Chart     Comment 2 Notify RN    GLUCOSE, CAPILLARY     Status: Abnormal   Collection Time   04/28/11  4:10 AM      Component Value Range   Glucose-Capillary 189 (*) 70 - 99 (mg/dL)   Comment 1 Notify RN    GLUCOSE, CAPILLARY     Status: Abnormal   Collection Time   04/28/11  5:09 AM      Component Value Range   Glucose-Capillary 149 (*) 70 - 99 (mg/dL)   Comment 1 Notify RN    BASIC METABOLIC PANEL     Status: Abnormal   Collection Time   04/28/11  5:20 AM      Component Value Range   Sodium 135  135 - 145 (mEq/L)   Potassium 4.3  3.5 - 5.1 (mEq/L)   Chloride 107  96 - 112 (mEq/L)   CO2 9 (*) 19 - 32 (mEq/L)   Glucose, Bld 130 (*) 70 - 99 (mg/dL)   BUN 18  6 - 23 (mg/dL)   Creatinine, Ser 9.56  0.50 - 1.10 (mg/dL)   Calcium 9.0  8.4 - 21.3 (mg/dL)   GFR calc non Af Amer >90  >90 (mL/min)   GFR calc Af Amer >90  >90 (mL/min)  CBC     Status: Abnormal   Collection Time   04/28/11  5:20 AM      Component Value Range   WBC 17.0 (*) 4.0 - 10.5 (K/uL)   RBC 5.03  3.87 - 5.11 (MIL/uL)   Hemoglobin 11.7 (*) 12.0 - 15.0 (g/dL)   HCT 08.6  57.8 - 46.9 (%)   MCV 73.0 (*) 78.0 - 100.0 (fL)   MCH 23.3 (*) 26.0 - 34.0 (pg)   MCHC 31.9  30.0 - 36.0 (g/dL)   RDW 62.9 (*) 52.8 - 15.5 (%)   Platelets 161  150 - 400 (K/uL)  LITHIUM LEVEL     Status: Abnormal   Collection Time   04/28/11  5:20 AM  Component Value Range   Lithium Lvl <0.25 (*) 0.80 - 1.40 (mEq/L)  GLUCOSE, CAPILLARY     Status: Abnormal   Collection  Time   04/28/11  6:16 AM      Component Value Range   Glucose-Capillary 123 (*) 70 - 99 (mg/dL)   Comment 1 Notify RN    GLUCOSE, CAPILLARY     Status: Abnormal   Collection Time   04/28/11  7:29 AM      Component Value Range   Glucose-Capillary 120 (*) 70 - 99 (mg/dL)   Comment 1 Notify RN    BASIC METABOLIC PANEL     Status: Abnormal   Collection Time   04/28/11  8:15 AM      Component Value Range   Sodium 132 (*) 135 - 145 (mEq/L)   Potassium 4.1  3.5 - 5.1 (mEq/L)   Chloride 104  96 - 112 (mEq/L)   CO2 8 (*) 19 - 32 (mEq/L)   Glucose, Bld 195 (*) 70 - 99 (mg/dL)   BUN 17  6 - 23 (mg/dL)   Creatinine, Ser 9.56 (*) 0.50 - 1.10 (mg/dL)   Calcium 8.9  8.4 - 21.3 (mg/dL)   GFR calc non Af Amer >90  >90 (mL/min)   GFR calc Af Amer >90  >90 (mL/min)  GLUCOSE, CAPILLARY     Status: Abnormal   Collection Time   04/28/11  8:30 AM      Component Value Range   Glucose-Capillary 245 (*) 70 - 99 (mg/dL)   Comment 1 Documented in Chart     Comment 2 Notify RN     Comment 3 Glucose Stabilizer    GLUCOSE, CAPILLARY     Status: Abnormal   Collection Time   04/28/11  9:35 AM      Component Value Range   Glucose-Capillary 206 (*) 70 - 99 (mg/dL)   Comment 1 Documented in Chart     Comment 2 Notify RN     Comment 3 Glucose Stabilizer    GLUCOSE, CAPILLARY     Status: Abnormal   Collection Time   04/28/11 10:24 AM      Component Value Range   Glucose-Capillary 178 (*) 70 - 99 (mg/dL)   Comment 1 Documented in Chart     Comment 2 Notify RN     Comment 3 Glucose Stabilizer     Recent Results (from the past 240 hour(s))  MRSA PCR SCREENING     Status: Normal   Collection Time   04/28/11  1:54 AM      Component Value Range Status Comment   MRSA by PCR NEGATIVE  NEGATIVE  Final     Studies/Results: Dg Chest Portable 1 View  04/27/2011  *RADIOLOGY REPORT*  Clinical Data: Chest pain.  PORTABLE CHEST - 1 VIEW  Comparison: Chest x-ray 03/13/2011.  Findings: The cardiac silhouette,  mediastinal and hilar contours are within normal limits and stable.  The lungs are clear.  The bony thorax is intact.  IMPRESSION: No acute cardiopulmonary findings.  Original Report Authenticated By: P. Loralie Champagne, M.D.    Medications: Scheduled Meds:   . enoxaparin  40 mg Subcutaneous Q24H  .  HYDROmorphone (DILAUDID) injection  1 mg Intravenous Once  . insulin regular  5 Units Intravenous Once  . lithium carbonate  300 mg Oral TID  . magnesium gluconate  500 mg Oral BID  . metoCLOPramide (REGLAN) injection  10 mg Intravenous Q8H  . ondansetron (ZOFRAN) IV  4 mg  Intravenous Once  . pantoprazole (PROTONIX) IV  40 mg Intravenous Q24H  . potassium chloride  10 mEq Intravenous Q1H  . sodium chloride  1,000 mL Intravenous Once  . sodium chloride  1,000 mL Intravenous Once  . DISCONTD: sodium chloride   Intravenous Once  . DISCONTD: insulin glargine  15 Units Subcutaneous QHS   Continuous Infusions:   . sodium chloride    . sodium chloride    . dextrose 5 % and 0.45% NaCl 100 mL/hr at 04/28/11 0600  . insulin (NOVOLIN-R) infusion 0.7 Units/hr (04/28/11 1132)   PRN Meds:.acetaminophen, acetaminophen, dextrose, HYDROcodone-acetaminophen, morphine, ondansetron  Assessment/Plan:  Principal Problem:  *DKA (diabetic ketoacidoses): The patient's anion gap is not yet closed and her bicarbonate continues to be low. She remains on the Glucomander and an insulin drip. This point, she needs more IV fluids so we'll add normal saline at 150 cc an hour in addition to her D5 one half normal saline at 100 cc an hour for total IV fluid rate of 250 cc per hour. Once she meets around orders for discontinuation of the insulin drip, she will be transitioned over to basal/bolus insulin. It appears that her DKA was triggered by medical nonadherence. Active Problems:  DM (diabetes mellitus): Continue insulin per the Glucomander protocol. We'll check a hemoglobin A1c to determine her overall glycemic  control.  Bipolar 2 disorder: Continue lithium. Patient's lithium level is low. Check a urine drug screen.  Seizure disorder: No events. The patient is not on any anti-seizure medications.  Cirrhosis of liver: We will check the patient's liver function studies to assure that they are stable.  Bactiuria: Follow-up urine culture results.    LOS: 1 day   RAMA,CHRISTINA 04/28/2011, 11:44 AM

## 2011-04-28 NOTE — H&P (Addendum)
PCP:   No primary provider on file.   Chief Complaint:  Generalized pain  HPI: This is a 40 year old female who has known history of diabetes mellitus and medical noncompliance. She presents today with complaints of generalized body pain. She states pain is in the chest abdomen and her back. She reports she's had nausea vomiting, no hematemesis. She reports no fevers. The she states this has been going on for 2 days. She states she has not taken out insulin in 2 days. Here in the ER labs done review the patient has DKA.   Review of Systems: (positives bolded) The patient denies anorexia, fever, weight loss,, vision loss, decreased hearing, hoarseness, chest pain, syncope, dyspnea on exertion, peripheral edema, balance deficits, hemoptysis, abdominal pain, melena, hematochezia, severe indigestion/heartburn, hematuria, incontinence, genital sores, muscle weakness, suspicious skin lesions, transient blindness, difficulty walking, depression, unusual weight change, abnormal bleeding, enlarged lymph nodes, angioedema, and breast masses.  Past Medical History: Past Medical History  Diagnosis Date  . Diabetes mellitus   . Hep C w/o coma, chronic   . Kidney stone   . Seizure   . Gastritis    Past Surgical History  Procedure Date  . Ankle surgery     Medications: Prior to Admission medications   Medication Sig Start Date End Date Taking? Authorizing Provider  aspirin EC 81 MG tablet Take 81 mg by mouth daily.     Yes Historical Provider, MD  lithium carbonate (LITHOBID) 300 MG CR tablet Take 300 mg by mouth 3 (three) times daily.     Yes Historical Provider, MD  magnesium gluconate (MAGONATE) 500 MG tablet Take 500 mg by mouth 2 (two) times daily.     Yes Historical Provider, MD  metoCLOPramide (REGLAN) 10 MG tablet Take 1 tablet (10 mg total) by mouth every 6 (six) hours. 04/25/11 05/05/11 Yes Grant Fontana, PA  pantoprazole (PROTONIX) 40 MG tablet Take 40 mg by mouth daily.     Yes  Historical Provider, MD  insulin aspart (NOVOLOG) 100 UNIT/ML injection Inject into the skin.      Historical Provider, MD  insulin glargine (LANTUS) 100 UNIT/ML injection Inject into the skin.      Historical Provider, MD    Allergies:   Allergies  Allergen Reactions  . Penicillins Rash    Social History:  reports that she has been smoking.  She does not have any smokeless tobacco history on file. Her alcohol and drug histories not on file.  Family History: History reviewed. No pertinent family history.  Physical Exam: Filed Vitals:   04/27/11 2115 04/27/11 2245 04/27/11 2252 04/28/11 0004  BP: 145/92 116/73 116/73 138/90  Pulse: 104 93 94 104  Temp:    97.9 F (36.6 C)  TempSrc:    Oral  Resp: 17 12 12 22   SpO2: 100% 100% 100% 100%    General:  Alert and oriented times three, well developed and nourished, ill-appearing female, cachectic Eyes: PERRLA, pink conjunctiva, no scleral icterus ENT: Dry oral mucosa, neck supple, no thyromegaly Lungs: clear to ascultation, no wheeze, no crackles, no use of accessory muscles Cardiovascular: regular rate and rhythm, no regurgitation, no gallops, no murmurs. No carotid bruits, no JVD Abdomen: soft, positive BS, nonspecific tenderness to palpation, non-distended, no organomegaly, not an acute abdomen GU: not examined Neuro: CN II - XII grossly intact, sensation intact Musculoskeletal: strength 5/5 all extremities, no clubbing, cyanosis or edema Skin: no rash, no subcutaneous crepitation, no decubitus Psych: appropriate patient   Labs on  Admission:   [facility] 04/27/11 2023 04/25/11 1255  NA 134* 138  K 4.2 4.1  CL 95* 98  CO2 6* 12*  GLUCOSE 387* 381*  BUN 21 7  CREATININE 0.67 0.43*  CALCIUM 9.7 10.2  MG -- --  PHOS -- --    Basename 04/27/11 2023 04/25/11 1255  AST 66* 64*  ALT 74* 66*  ALKPHOS 110 101  BILITOT 0.3 0.3  PROT 9.1* 8.1  ALBUMIN 4.8 4.1    Basename 04/25/11 1255  LIPASE 26  AMYLASE --     Basename 04/27/11 2023 04/25/11 1255  WBC 13.2* 6.6  NEUTROABS 9.6* 4.0  HGB 15.1* 13.1  HCT 48.1* 41.3  MCV 74.5* 72.8*  PLT 179 146*   No results found for this basename: CKTOTAL:3,CKMB:3,CKMBINDEX:3,TROPONINI:3 in the last 72 hours No results found for this basename: TSH,T4TOTAL,FREET3,T3FREE,THYROIDAB in the last 72 hours No results found for this basename: VITAMINB12:2,FOLATE:2,FERRITIN:2,TIBC:2,IRON:2,RETICCTPCT:2 in the last 72 hours  Radiological Exams on Admission: Dg Chest Portable 1 View  04/27/2011  *RADIOLOGY REPORT*  Clinical Data: Chest pain.  PORTABLE CHEST - 1 VIEW  Comparison: Chest x-ray 03/13/2011.  Findings: The cardiac silhouette, mediastinal and hilar contours are within normal limits and stable.  The lungs are clear.  The bony thorax is intact.  IMPRESSION: No acute cardiopulmonary findings.  Original Report Authenticated By: P. Loralie Champagne, M.D.    Assessment/Plan Present on Admission:  .DKA (diabetic ketoacidoses) -Admit to step down DKA protocol Rule out infection as a cause with UA and blood cultures. IV protonix, resume Reglan Aggressive IV hydration  Bipolar disease Hepatitis C  Seizure Liver cirrhosis  Stable Resume medications Check ammonia level Lithium level ordered  Full code DVT/GI prohlaxis Team 2  Fronie Holstein 04/28/2011, 12:22 AM

## 2011-04-29 DIAGNOSIS — D509 Iron deficiency anemia, unspecified: Secondary | ICD-10-CM | POA: Diagnosis present

## 2011-04-29 LAB — BASIC METABOLIC PANEL
BUN: 6 mg/dL (ref 6–23)
CO2: 13 mEq/L — ABNORMAL LOW (ref 19–32)
Calcium: 9 mg/dL (ref 8.4–10.5)
Creatinine, Ser: 0.4 mg/dL — ABNORMAL LOW (ref 0.50–1.10)
Glucose, Bld: 254 mg/dL — ABNORMAL HIGH (ref 70–99)

## 2011-04-29 LAB — GLUCOSE, CAPILLARY
Glucose-Capillary: 164 mg/dL — ABNORMAL HIGH (ref 70–99)
Glucose-Capillary: 141 mg/dL — ABNORMAL HIGH (ref 70–99)
Glucose-Capillary: 172 mg/dL — ABNORMAL HIGH (ref 70–99)
Glucose-Capillary: 161 mg/dL — ABNORMAL HIGH (ref 70–99)

## 2011-04-29 LAB — COMPREHENSIVE METABOLIC PANEL
AST: 37 U/L (ref 0–37)
BUN: 9 mg/dL (ref 6–23)
CO2: 15 mEq/L — ABNORMAL LOW (ref 19–32)
Calcium: 8.7 mg/dL (ref 8.4–10.5)
Creatinine, Ser: 0.48 mg/dL — ABNORMAL LOW (ref 0.50–1.10)
GFR calc Af Amer: >90 mL/min (ref >90)
GFR calc non Af Amer: >90 mL/min (ref >90)
Glucose, Bld: 162 mg/dL — ABNORMAL HIGH (ref 70–99)

## 2011-04-29 LAB — RAPID URINE DRUG SCREEN, HOSP PERFORMED
Amphetamines: NOT DETECTED
Barbiturates: NOT DETECTED
Benzodiazepines: NOT DETECTED
Cocaine: NOT DETECTED
Opiates: POSITIVE — AB
Tetrahydrocannabinol: NOT DETECTED

## 2011-04-29 LAB — URINE CULTURE
Colony Count: 4000
Culture  Setup Time: 201211111046

## 2011-04-29 LAB — CBC
Hemoglobin: 10.1 g/dL — ABNORMAL LOW (ref 12.0–15.0)
MCH: 23.3 pg — ABNORMAL LOW (ref 26.0–34.0)
RBC: 4.34 MIL/uL (ref 3.87–5.11)

## 2011-04-29 MED ORDER — INSULIN ASPART 100 UNIT/ML ~~LOC~~ SOLN
0.0000 [IU] | Freq: Three times a day (TID) | SUBCUTANEOUS | Status: DC
  Administered 2011-04-29 (×2): 5 [IU] via SUBCUTANEOUS
  Administered 2011-04-30: 2 [IU] via SUBCUTANEOUS
  Administered 2011-04-30: 7 [IU] via SUBCUTANEOUS
  Filled 2011-04-29: qty 3

## 2011-04-29 MED ORDER — INSULIN GLARGINE 100 UNIT/ML ~~LOC~~ SOLN
10.0000 [IU] | SUBCUTANEOUS | Status: DC
  Administered 2011-04-29 – 2011-04-30 (×2): 10 [IU] via SUBCUTANEOUS
  Filled 2011-04-29: qty 3

## 2011-04-29 MED ORDER — POTASSIUM CHLORIDE 10 MEQ/100ML IV SOLN
10.0000 meq | INTRAVENOUS | Status: AC
  Administered 2011-04-29 (×4): 10 meq via INTRAVENOUS

## 2011-04-29 MED ORDER — POTASSIUM CHLORIDE CRYS ER 20 MEQ PO TBCR
40.0000 meq | EXTENDED_RELEASE_TABLET | Freq: Once | ORAL | Status: AC
  Administered 2011-04-29: 40 meq via ORAL
  Filled 2011-04-29: qty 2

## 2011-04-29 MED ORDER — INSULIN ASPART 100 UNIT/ML ~~LOC~~ SOLN
0.0000 [IU] | Freq: Every day | SUBCUTANEOUS | Status: DC
  Administered 2011-04-29: 3 [IU] via SUBCUTANEOUS

## 2011-04-29 MED ORDER — LIVING WELL WITH DIABETES BOOK
Freq: Once | Status: DC
  Filled 2011-04-29: qty 1

## 2011-04-29 MED ORDER — POTASSIUM CHLORIDE 10 MEQ/100ML IV SOLN
INTRAVENOUS | Status: AC
  Administered 2011-04-29: 10 meq via INTRAVENOUS
  Filled 2011-04-29: qty 400

## 2011-04-29 MED ORDER — INSULIN ASPART 100 UNIT/ML ~~LOC~~ SOLN
3.0000 [IU] | Freq: Three times a day (TID) | SUBCUTANEOUS | Status: DC
  Administered 2011-04-29 – 2011-04-30 (×3): 3 [IU] via SUBCUTANEOUS

## 2011-04-29 NOTE — Progress Notes (Signed)
UR completed 

## 2011-04-29 NOTE — Progress Notes (Signed)
K+ 2.9, KCl iv given over 4 hours per protocol per v/o Dr Sherene Sires.

## 2011-04-29 NOTE — Progress Notes (Signed)
Spoke with patient at bedside. States lives with her fiance' but situation is not optimal. State he "drinks all the time". Has eligibility appt with [facility] on 05-08-11 at 0945, made appt for patient for f/u visit on 06-30-10 with Dr. Andrey Campanile at 1430. States has some supplies at home but no test strips, states has no financial resources. Has applied for Medicaid but fiance' has not completed his portion of the application. Agreed to [facility] referral for community resources. Patient complaining of abd pain so will f/u later.

## 2011-04-29 NOTE — Plan of Care (Signed)
Problem: Consults Goal: Diabetes Guidelines if Diabetic/Glucose > 140 If diabetic or lab glucose is > 140 mg/dl - Initiate Diabetes/Hyperglycemia Guidelines & Document Interventions  Outcome: Progressing Diabetic nurse requested to see pt.

## 2011-04-29 NOTE — Plan of Care (Signed)
Problem: Food- and Nutrition-Related Knowledge Deficit (NB-1.1) Goal: Nutrition education Formal process to instruct or train a patient/client in a skill or to impart knowledge to help patients/clients voluntarily manage or modify food choices and eating behavior to maintain or improve health.  Outcome: Completed/Met Date Met:  04/29/11 Discussed ongoing nutrition goals

## 2011-04-29 NOTE — Progress Notes (Signed)
Inpatient Diabetes Program Recommendations  AACE/ADA: New Consensus Statement on Inpatient Glycemic Control (2009)  Target Ranges:  Prepandial:   less than 140 mg/dL      Peak postprandial:   less than 180 mg/dL (1-2 hours)      Critically ill patients:  140 - 180 mg/dL   Reason for Visit: consult for HgbA1C 14.8 and non-compliance with meds  Inpatient Diabetes Program Recommendations Insulin - Basal: Agree with Lantus 10 units QAM Insulin - Meal Coverage: Agree with meal coverage insulin - Novolog 3 units tid HgbA1C: 14.8% Has appt. with [facility] on May 08, 2011 Outpatient Referral: please order OP Diabetes Education consult for HgbA1C > 7.  Note: Pt. States she has not taken insulin X 2 days, said she "didn't feel good."  Has meter at home, but no strips.  Pt. C/o difficulty with finances because she is unemployed, and cannot pay for meds.  Lives with fiance but said home situation is not very good.  Discussed importance of taking insulin and checking BSs on a regular basis, especially when she doesn't feel well.  Would like to go to Outpatient Diabetes classes after [facility] appt. Recommend social work consult and Sports coach consult.  Discussed with Dondra Spry, RN.

## 2011-04-29 NOTE — Progress Notes (Signed)
Interval History: Mary [name] is a 40 year old female who was admitted to the hospital in diabetic ketoacidosis. The nursing staff reports that she has been off the insulin drip since 7 AM this morning but that she has not yet been put on Lantus. She has also required by mouth narcotics for back pain overnight. ROS: The patient continues to complain of abdominal pain and nausea. She denies chest pain, dysuria, cough, diarrhea. She has not had any solid foods yet.   Objective: Vital signs in last 24 hours: Temp:  [98 F (36.7 C)-99.7 F (37.6 C)] 98 F (36.7 C) (11/12 0800) Pulse Rate:  [64-126] 75  (11/12 0700) Resp:  [10-25] 13  (11/12 0700) BP: (101-148)/(61-87) 101/70 mmHg (11/12 0700) SpO2:  [99 %-100 %] 99 % (11/12 0700) Weight:  [51.5 kg (113 lb 8.6 oz)] 113 lb 8.6 oz (51.5 kg) (11/12 0002) Weight change: 2.5 kg (5 lb 8.2 oz) Last BM Date: 04/28/11  Intake/Output from previous day: 11/11 0701 - 11/12 0700 In: 2905 [I.V.:2805; IV Piggyback:100] Out: 1966 [Urine:1965; Stool:1]   CBG (last 3)   Basename 04/29/11 0812 04/29/11 0700 04/29/11 0613  GLUCAP 165* 119* 153*      Physical Exam:  Gen:  Alert, no acute distress. Cardiovascular: Regular rate, and rhythm. No murmurs, rubs, or gallops. Respiratory: Lungs are clear to auscultation bilaterally. Gastrointestinal: Mildly tender but soft. Bowel sounds are present x4. Extremities: No clubbing, edema, or cyanosis.   Lab Results: Results for orders placed during the hospital encounter of 04/27/11 (from the past 24 hour(s))  GLUCOSE, CAPILLARY     Status: Abnormal   Collection Time   04/28/11  9:35 AM      Component Value Range   Glucose-Capillary 206 (*) 70 - 99 (mg/dL)   Comment 1 Documented in Chart     Comment 2 Notify RN     Comment 3 Glucose Stabilizer    GLUCOSE, CAPILLARY     Status: Abnormal   Collection Time   04/28/11 10:24 AM      Component Value Range   Glucose-Capillary 178 (*) 70 - 99 (mg/dL)   Comment  1 Documented in Chart     Comment 2 Notify RN     Comment 3 Glucose Stabilizer    GLUCOSE, CAPILLARY     Status: Abnormal   Collection Time   04/28/11 11:28 AM      Component Value Range   Glucose-Capillary 126 (*) 70 - 99 (mg/dL)   Comment 1 Documented in Chart     Comment 2 Notify RN     Comment 3 Glucose Stabilizer    GLUCOSE, CAPILLARY     Status: Abnormal   Collection Time   04/28/11 12:32 PM      Component Value Range   Glucose-Capillary 133 (*) 70 - 99 (mg/dL)   Comment 1 Notify RN    HEMOGLOBIN A1C     Status: Abnormal   Collection Time   04/28/11  1:00 PM      Component Value Range   Hemoglobin A1C 14.8 (*) <5.7 (%)   Mean Plasma Glucose 378 (*) <117 (mg/dL)  BASIC METABOLIC PANEL     Status: Abnormal   Collection Time   04/28/11  1:00 PM      Component Value Range   Sodium 134 (*) 135 - 145 (mEq/L)   Potassium 4.0  3.5 - 5.1 (mEq/L)   Chloride 107  96 - 112 (mEq/L)   CO2 12 (*) 19 - 32 (mEq/L)  Glucose, Bld 135 (*) 70 - 99 (mg/dL)   BUN 17  6 - 23 (mg/dL)   Creatinine, Ser 0.45  0.50 - 1.10 (mg/dL)   Calcium 9.5  8.4 - 40.9 (mg/dL)   GFR calc non Af Amer >90  >90 (mL/min)   GFR calc Af Amer >90  >90 (mL/min)  GLUCOSE, CAPILLARY     Status: Abnormal   Collection Time   04/28/11  1:35 PM      Component Value Range   Glucose-Capillary 168 (*) 70 - 99 (mg/dL)   Comment 1 Documented in Chart     Comment 2 Notify RN     Comment 3 Glucose Stabilizer    GLUCOSE, CAPILLARY     Status: Abnormal   Collection Time   04/28/11  2:39 PM      Component Value Range   Glucose-Capillary 228 (*) 70 - 99 (mg/dL)   Comment 1 Documented in Chart     Comment 2 Notify RN     Comment 3 Glucose Stabilizer    GLUCOSE, CAPILLARY     Status: Abnormal   Collection Time   04/28/11  3:46 PM      Component Value Range   Glucose-Capillary 197 (*) 70 - 99 (mg/dL)   Comment 1 Notify RN     Comment 2 Documented in Chart    BASIC METABOLIC PANEL     Status: Abnormal   Collection Time    04/28/11  4:18 PM      Component Value Range   Sodium 133 (*) 135 - 145 (mEq/L)   Potassium 3.3 (*) 3.5 - 5.1 (mEq/L)   Chloride 106  96 - 112 (mEq/L)   CO2 14 (*) 19 - 32 (mEq/L)   Glucose, Bld 194 (*) 70 - 99 (mg/dL)   BUN 16  6 - 23 (mg/dL)   Creatinine, Ser 8.11  0.50 - 1.10 (mg/dL)   Calcium 9.2  8.4 - 91.4 (mg/dL)   GFR calc non Af Amer >90  >90 (mL/min)   GFR calc Af Amer >90  >90 (mL/min)  GLUCOSE, CAPILLARY     Status: Abnormal   Collection Time   04/28/11  4:33 PM      Component Value Range   Glucose-Capillary 159 (*) 70 - 99 (mg/dL)  GLUCOSE, CAPILLARY     Status: Abnormal   Collection Time   04/28/11  5:53 PM      Component Value Range   Glucose-Capillary 132 (*) 70 - 99 (mg/dL)   Comment 1 Notify RN    GLUCOSE, CAPILLARY     Status: Normal   Collection Time   04/28/11  6:59 PM      Component Value Range   Glucose-Capillary 98  70 - 99 (mg/dL)   Comment 1 Notify RN    GLUCOSE, CAPILLARY     Status: Abnormal   Collection Time   04/28/11  8:03 PM      Component Value Range   Glucose-Capillary 137 (*) 70 - 99 (mg/dL)  BASIC METABOLIC PANEL     Status: Abnormal   Collection Time   04/28/11  8:32 PM      Component Value Range   Sodium 134 (*) 135 - 145 (mEq/L)   Potassium 3.8  3.5 - 5.1 (mEq/L)   Chloride 105  96 - 112 (mEq/L)   CO2 14 (*) 19 - 32 (mEq/L)   Glucose, Bld 150 (*) 70 - 99 (mg/dL)   BUN 14  6 - 23 (mg/dL)  Creatinine, Ser 0.49 (*) 0.50 - 1.10 (mg/dL)   Calcium 9.3  8.4 - 16.1 (mg/dL)   GFR calc non Af Amer >90  >90 (mL/min)   GFR calc Af Amer >90  >90 (mL/min)  GLUCOSE, CAPILLARY     Status: Abnormal   Collection Time   04/28/11  9:07 PM      Component Value Range   Glucose-Capillary 164 (*) 70 - 99 (mg/dL)  GLUCOSE, CAPILLARY     Status: Abnormal   Collection Time   04/28/11 10:12 PM      Component Value Range   Glucose-Capillary 214 (*) 70 - 99 (mg/dL)  GLUCOSE, CAPILLARY     Status: Abnormal   Collection Time   04/28/11 11:12 PM        Component Value Range   Glucose-Capillary 178 (*) 70 - 99 (mg/dL)  GLUCOSE, CAPILLARY     Status: Abnormal   Collection Time   04/28/11 11:56 PM      Component Value Range   Glucose-Capillary 185 (*) 70 - 99 (mg/dL)  GLUCOSE, CAPILLARY     Status: Abnormal   Collection Time   04/29/11  1:08 AM      Component Value Range   Glucose-Capillary 127 (*) 70 - 99 (mg/dL)  GLUCOSE, CAPILLARY     Status: Abnormal   Collection Time   04/29/11  2:51 AM      Component Value Range   Glucose-Capillary 141 (*) 70 - 99 (mg/dL)  COMPREHENSIVE METABOLIC PANEL     Status: Abnormal   Collection Time   04/29/11  3:36 AM      Component Value Range   Sodium 130 (*) 135 - 145 (mEq/L)   Potassium 2.9 (*) 3.5 - 5.1 (mEq/L)   Chloride 101  96 - 112 (mEq/L)   CO2 15 (*) 19 - 32 (mEq/L)   Glucose, Bld 162 (*) 70 - 99 (mg/dL)   BUN 9  6 - 23 (mg/dL)   Creatinine, Ser 0.96 (*) 0.50 - 1.10 (mg/dL)   Calcium 8.7  8.4 - 04.5 (mg/dL)   Total Protein 5.8 (*) 6.0 - 8.3 (g/dL)   Albumin 3.1 (*) 3.5 - 5.2 (g/dL)   AST 37  0 - 37 (U/L)   ALT 42 (*) 0 - 35 (U/L)   Alkaline Phosphatase 62  39 - 117 (U/L)   Total Bilirubin 0.4  0.3 - 1.2 (mg/dL)   GFR calc non Af Amer >90  >90 (mL/min)   GFR calc Af Amer >90  >90 (mL/min)  CBC     Status: Abnormal   Collection Time   04/29/11  3:36 AM      Component Value Range   WBC 9.9  4.0 - 10.5 (K/uL)   RBC 4.34  3.87 - 5.11 (MIL/uL)   Hemoglobin 10.1 (*) 12.0 - 15.0 (g/dL)   HCT 40.9 (*) 81.1 - 46.0 (%)   MCV 71.2 (*) 78.0 - 100.0 (fL)   MCH 23.3 (*) 26.0 - 34.0 (pg)   MCHC 32.7  30.0 - 36.0 (g/dL)   RDW 91.4 (*) 78.2 - 15.5 (%)   Platelets 118 (*) 150 - 400 (K/uL)  MAGNESIUM     Status: Normal   Collection Time   04/29/11  3:36 AM      Component Value Range   Magnesium 1.7  1.5 - 2.5 (mg/dL)  GLUCOSE, CAPILLARY     Status: Abnormal   Collection Time   04/29/11  3:58 AM      Component  Value Range   Glucose-Capillary 172 (*) 70 - 99 (mg/dL)   Comment 1  Documented in Chart     Comment 2 Notify RN    GLUCOSE, CAPILLARY     Status: Abnormal   Collection Time   04/29/11  5:17 AM      Component Value Range   Glucose-Capillary 161 (*) 70 - 99 (mg/dL)  GLUCOSE, CAPILLARY     Status: Abnormal   Collection Time   04/29/11  6:13 AM      Component Value Range   Glucose-Capillary 153 (*) 70 - 99 (mg/dL)  GLUCOSE, CAPILLARY     Status: Abnormal   Collection Time   04/29/11  7:00 AM      Component Value Range   Glucose-Capillary 119 (*) 70 - 99 (mg/dL)   Comment 1 Documented in Chart     Comment 2 Notify RN    GLUCOSE, CAPILLARY     Status: Abnormal   Collection Time   04/29/11  8:12 AM      Component Value Range   Glucose-Capillary 165 (*) 70 - 99 (mg/dL)   Comment 1 Documented in Chart     Comment 2 Notify RN     Recent Results (from the past 240 hour(s))  MRSA PCR SCREENING     Status: Normal   Collection Time   04/28/11  1:54 AM      Component Value Range Status Comment   MRSA by PCR NEGATIVE  NEGATIVE  Final     Studies/Results: No new studies.  Medications: Scheduled Meds:   . enoxaparin  40 mg Subcutaneous Q24H  . lithium carbonate  300 mg Oral TID  . metoCLOPramide (REGLAN) injection  10 mg Intravenous Q8H  . morphine      . morphine      . pantoprazole (PROTONIX) IV  40 mg Intravenous Q24H  . potassium chloride  10 mEq Intravenous Q1 Hr x 4  . potassium chloride  10 mEq Intravenous Q1 Hr x 4  . potassium chloride  40 mEq Oral Once  . DISCONTD: magnesium gluconate  500 mg Oral BID   Continuous Infusions:   . sodium chloride 150 mL/hr at 04/29/11 0819  . sodium chloride    . dextrose 5 % and 0.45% NaCl 100 mL/hr at 04/29/11 0715  . insulin (NOVOLIN-R) infusion 0.9 mL/hr at 04/29/11 0606   PRN Meds:.acetaminophen, acetaminophen, dextrose, HYDROcodone-acetaminophen, morphine injection, ondansetron, DISCONTD: morphine  Assessment/Plan:  Principal Problem:  *DKA (diabetic ketoacidoses): Patient's anion gap is  not yet closed but is better than it was yesterday. She continues to have low bicarbonate/acidosis. Her insulin drip was stopped at 7 AM. I had the nurse give her a stat dose of Lantus, 10 units and we'll start her on sliding scale insulin with meal coverage. Active Problems:  DM (diabetes mellitus): The hemoglobin A1c is 14.8. Will request a diabetes coordinator consultation to evaluate the patient's nonadherence.  Bipolar 2 disorder: Continue lithium. Awaiting results of urine drug screen.  Cirrhosis of liver: Patient's liver function studies are stable.  Bacteria in urine: Urine culture is still pending.  It was sent out late. Seizure disorder: Patient has not had any seizure events.  Microcytic anemia: The patient reports that she continues to menstruate which is the likely source of her anemia. Would not start iron right now since she is still nauseated.   LOS: 2 days   Hillery Aldo, MD Pager [phone]  04/29/2011, 9:02 AM

## 2011-04-29 NOTE — Progress Notes (Signed)
INITIAL ADULT NUTRITION ASSESSMENT Date: 04/29/2011   Time: 1:24 PM Reason for Assessment: health hx  ASSESSMENT: Female 40 y.o.  Dx: DKA (diabetic ketoacidoses)  Hx: Past Medical History  Diagnosis Date  . Diabetes mellitus   . Hep C w/o coma, chronic   . Kidney stone   . Seizure   . Gastritis   . Mental disorder   . Depression     Scheduled Meds:   . enoxaparin  40 mg Subcutaneous Q24H  . insulin aspart  0-5 Units Subcutaneous QHS  . insulin aspart  0-9 Units Subcutaneous TID WC  . insulin aspart  3 Units Subcutaneous TID WC  . insulin glargine  10 Units Subcutaneous QAM  . lithium carbonate  300 mg Oral TID  . metoCLOPramide (REGLAN) injection  10 mg Intravenous Q8H  . morphine      . pantoprazole (PROTONIX) IV  40 mg Intravenous Q24H  . potassium chloride  10 mEq Intravenous Q1 Hr x 4  . potassium chloride  10 mEq Intravenous Q1 Hr x 4  . potassium chloride  40 mEq Oral Once   Continuous Infusions:   . sodium chloride 150 mL/hr at 04/29/11 0819  . sodium chloride    . dextrose 5 % and 0.45% NaCl 100 mL/hr at 04/29/11 0800  . DISCONTD: insulin (NOVOLIN-R) infusion 0.9 mL/hr at 04/29/11 0606   PRN Meds:.acetaminophen, acetaminophen, dextrose, HYDROcodone-acetaminophen, morphine injection, ondansetron, DISCONTD: morphine   Ht: 5\' 4"  (162.6 cm)  Wt: 113 lb 8.6 oz (51.5 kg)  Ideal Wt: 63.4 kg % Ideal Wt: 81%  Usual Wt: 180 lbs prior to starting wt loss plan % Usual Wt:   Body mass index is 19.49 kg/(m^2).  Food/Nutrition Related Hx: Pt reports she weighed 180 lbs prior to starting a weight loss program last year.  Pt reports successful weight loss which was appropriate, however over the past 3-6 months she has been ill with frequent hospital admissions at which point wt loss progressed without intent.  Pt felt she was powerless to stop wt loss.  Pt also expresses concern over managing her chronic diseases.  Pt states she would really like to participate in  some DM classes post d/c for ongoing education in managing her blood glucose.  Pt states she feels trapped by her health status as well as the complications in her health that resulted from her last relapse/rehab admission.  Labs:   CMP     Component Value Date/Time   NA 130* 04/29/2011 0336   K 2.9* 04/29/2011 0336   CL 101 04/29/2011 0336   CO2 15* 04/29/2011 0336   GLUCOSE 162* 04/29/2011 0336   BUN 9 04/29/2011 0336   CREATININE 0.48* 04/29/2011 0336   CALCIUM 8.7 04/29/2011 0336   PROT 5.8* 04/29/2011 0336   ALBUMIN 3.1* 04/29/2011 0336   AST 37 04/29/2011 0336   ALT 42* 04/29/2011 0336   ALKPHOS 62 04/29/2011 0336   BILITOT 0.4 04/29/2011 0336   GFRNONAA >90 04/29/2011 0336   GFRAA >90 04/29/2011 0336    Intake: tray not yet received Output: pos UOP, 1 stool today  Diet Order: CHO Mod Medium  Supplements/Tube Feeding: none  IVF:    sodium chloride Last Rate: 150 mL/hr at 04/29/11 0819  sodium chloride   dextrose 5 % and 0.45% NaCl Last Rate: 100 mL/hr at 04/29/11 0800  DISCONTD: insulin (NOVOLIN-R) infusion Last Rate: 0.9 mL/hr at 04/29/11 0606    Estimated Nutritional Needs:   Kcal:1550-1800 kcal Protein: 61-72 g  Fluid: >1.6 L/day  NUTRITION DIAGNOSIS: 1.  Underweight (NI-3.1).  Status: Ongoing 2.  Food and nutrition related knowledge deficit (NB-1.1).  Status: Ongoing  RELATED TO:  1.  Unintentional wt loss 2.  DM management  AS EVIDENCE BY:  1.  37% self reported wt loss over the past 6 months-1 year 2.  HgBA1C: 14.8 mg/dL  MONITORING/EVALUATION(Goals): 1.  Food/Beverage; pt to tolerate diet advancement with >50% of meals consumed. 2.  Wt/wt change; deter further loss 3.  Nutrition knowledge; for questions  EDUCATION NEEDS: -Education needs addressed  INTERVENTION: 1.  Brief education; provided.  Discussed ongoing goals.  Discussed initial steps needed to improve glucose control. 2.  Meals/snacks; encourage intake  Dietitian #:  [phone]  DOCUMENTATION CODES Per approved criteria  -Non-severe (moderate) malnutrition in the context of chronic illness    Hoyt Koch 04/29/2011, 1:24 PM

## 2011-04-29 NOTE — Progress Notes (Signed)
04/28/11  2120 C/O generalized abdominal/ back pain. 2 hydrocodone 5-325 tabs given. Mary, [facility] text paged for morphine renewal. 40 Meq KCl  IV completing. 04/28/11  2200 Insulin drip resumed for CBG 214. 2mg  morphine iv given for pain "5/10" 04/29/11  0300 2 hydrocone 5-235 po given for c/o pain "5/10". Remains on insulin drip.  Labs drawn.

## 2011-04-29 NOTE — Plan of Care (Cosign Needed)
Problem: Consults Goal: Diabetic Ketoacidosis (DKA) Patient Education See Patient Education Modules for education specifics.  Dietician in to see pt today.  Comments:  Dondra Spry Slater Mcmanaman rn

## 2011-04-30 DIAGNOSIS — E876 Hypokalemia: Secondary | ICD-10-CM | POA: Diagnosis not present

## 2011-04-30 LAB — CBC
HCT: 32.1 % — ABNORMAL LOW (ref 36.0–46.0)
Hemoglobin: 10.4 g/dL — ABNORMAL LOW (ref 12.0–15.0)
MCH: 23.2 pg — ABNORMAL LOW (ref 26.0–34.0)
MCHC: 32.4 g/dL (ref 30.0–36.0)

## 2011-04-30 LAB — GLUCOSE, CAPILLARY
Glucose-Capillary: 277 mg/dL — ABNORMAL HIGH (ref 70–99)
Glucose-Capillary: 227 mg/dL — ABNORMAL HIGH (ref 70–99)

## 2011-04-30 LAB — BASIC METABOLIC PANEL
BUN: 5 mg/dL — ABNORMAL LOW (ref 6–23)
Chloride: 97 mEq/L (ref 96–112)
Glucose, Bld: 279 mg/dL — ABNORMAL HIGH (ref 70–99)
Potassium: 3.4 mEq/L — ABNORMAL LOW (ref 3.5–5.1)

## 2011-04-30 MED ORDER — INSULIN ASPART 100 UNIT/ML ~~LOC~~ SOLN
0.0000 [IU] | Freq: Three times a day (TID) | SUBCUTANEOUS | Status: DC
  Administered 2011-04-30: 5 [IU] via SUBCUTANEOUS
  Administered 2011-05-01: 11 [IU] via SUBCUTANEOUS
  Administered 2011-05-01: 5 [IU] via SUBCUTANEOUS

## 2011-04-30 MED ORDER — INSULIN ASPART 100 UNIT/ML ~~LOC~~ SOLN
4.0000 [IU] | Freq: Three times a day (TID) | SUBCUTANEOUS | Status: DC
  Administered 2011-04-30 – 2011-05-01 (×3): 4 [IU] via SUBCUTANEOUS

## 2011-04-30 MED ORDER — POTASSIUM CHLORIDE CRYS ER 20 MEQ PO TBCR
40.0000 meq | EXTENDED_RELEASE_TABLET | Freq: Once | ORAL | Status: AC
  Administered 2011-04-30: 40 meq via ORAL
  Filled 2011-04-30: qty 2

## 2011-04-30 MED ORDER — INSULIN ASPART 100 UNIT/ML ~~LOC~~ SOLN: 0.0000 [IU] | Freq: Every day | SUBCUTANEOUS | Status: DC

## 2011-04-30 MED ORDER — INSULIN GLARGINE 100 UNIT/ML ~~LOC~~ SOLN: 10.0000 [IU] | SUBCUTANEOUS | Status: DC

## 2011-04-30 MED ORDER — INSULIN GLARGINE 100 UNIT/ML ~~LOC~~ SOLN
15.0000 [IU] | SUBCUTANEOUS | Status: DC
  Administered 2011-05-01: 15 [IU] via SUBCUTANEOUS

## 2011-04-30 NOTE — Progress Notes (Signed)
Spoke with Bjorn Loser RN the diabetes nurse regarding the diabetic education booklet and she did not feel this patient would benefit from the information since it was targeted to type II diabetes...this patient is type I diabetic

## 2011-04-30 NOTE — Plan of Care (Signed)
Problem: Phase I Progression Outcomes Goal: OOB as tolerated unless otherwise ordered Outcome: Progressing Out of bed with assistance due to unsteady gait

## 2011-04-30 NOTE — Progress Notes (Signed)
CBGs still high.  Pt. States she's feeling better, eating well.  Recommend 70/30 12 units bid since pt cannot afford Lantus.  [facility] no longer uses Lantus per case mgmt d/t costs.  Pt. Will need prescription for meter and strips - recommend [facility] brand as strips are much less costly.  Patient would like to go home on insulin pen instead of syringes.  If ok, please order insulin starter kit for pen.  Answered questions regarding glucose management and pt. Verbalized understanding of importance of maintaining normal blood sugars.  Will follow in am.

## 2011-04-30 NOTE — Progress Notes (Signed)
Interval History: Mary [name] is a 40 year old female who was admitted to the hospital in diabetic ketoacidosis secondary to medical nonadherence. Patient has a poor social situation at home and is at high-risk for recurrent hospitalizations do to the inability to afford her medications and other social factors. She has been successfully titrated off of the insulin drip and placed on basal/bolus insulin therapy. Her anion gap is still slightly elevated and her bicarbonate levels are still a little low and therefore she is not yet ready for discharge.  ROS: Mary [name] feels better. She continues to have some epigastric discomfort but the nausea and vomiting have resolved. She is more awake and alert.   Objective: Vital signs in last 24 hours: Temp:  [97.4 F (36.3 C)-99.1 F (37.3 C)] 99.1 F (37.3 C) (11/13 1358) Pulse Rate:  [78-85] 79  (11/13 1358) Resp:  [14-20] 19  (11/13 1358) BP: (106-136)/(60-93) 132/92 mmHg (11/13 1358) SpO2:  [99 %-100 %] 100 % (11/13 1358) Weight:  [53.207 kg (117 lb 4.8 oz)] 117 lb 4.8 oz (53.207 kg) (11/12 1736) Weight change: 1.707 kg (3 lb 12.2 oz) Last BM Date: 04/28/11  Intake/Output from previous day: 11/12 0701 - 11/13 0700 In: 4104 [P.O.:240; I.V.:3850; IV Piggyback:14] Out: 1501 [Urine:1501] Total I/O In: -  Out: 1 [Stool:1] CBG (last 3)   Basename 04/30/11 1131 04/30/11 0724 04/29/11 2126  GLUCAP 305* 277* 295*      Physical Exam:  Gen:  Alert, awake. She continues to have ketones on her breath. Cardiovascular:  Regular rate, and rhythm. No murmurs, rubs, or gallops. Respiratory: Clear to auscultation bilaterally with good air movement. Gastrointestinal: Soft, nontender, nondistended with normal active bowel sounds. Extremities: No clubbing, edema, or cyanosis.   Lab Results: Results for orders placed during the hospital encounter of 04/27/11 (from the past 24 hour(s))  GLUCOSE, CAPILLARY     Status: Abnormal   Collection Time   04/29/11   5:50 PM      Component Value Range   Glucose-Capillary 272 (*) 70 - 99 (mg/dL)   Comment 1 Notify RN    BASIC METABOLIC PANEL     Status: Abnormal   Collection Time   04/29/11  7:00 PM      Component Value Range   Sodium 127 (*) 135 - 145 (mEq/L)   Potassium 3.8  3.5 - 5.1 (mEq/L)   Chloride 94 (*) 96 - 112 (mEq/L)   CO2 13 (*) 19 - 32 (mEq/L)   Glucose, Bld 254 (*) 70 - 99 (mg/dL)   BUN 6  6 - 23 (mg/dL)   Creatinine, Ser 8.29 (*) 0.50 - 1.10 (mg/dL)   Calcium 9.0  8.4 - 56.2 (mg/dL)   GFR calc non Af Amer >90  >90 (mL/min)   GFR calc Af Amer >90  >90 (mL/min)  GLUCOSE, CAPILLARY     Status: Abnormal   Collection Time   04/29/11  9:26 PM      Component Value Range   Glucose-Capillary 295 (*) 70 - 99 (mg/dL)   Comment 1 Notify RN    BASIC METABOLIC PANEL     Status: Abnormal   Collection Time   04/30/11  5:00 AM      Component Value Range   Sodium 131 (*) 135 - 145 (mEq/L)   Potassium 3.4 (*) 3.5 - 5.1 (mEq/L)   Chloride 97  96 - 112 (mEq/L)   CO2 17 (*) 19 - 32 (mEq/L)   Glucose, Bld 279 (*) 70 - 99 (  mg/dL)   BUN 5 (*) 6 - 23 (mg/dL)   Creatinine, Ser 1.61 (*) 0.50 - 1.10 (mg/dL)   Calcium 9.1  8.4 - 09.6 (mg/dL)   GFR calc non Af Amer >90  >90 (mL/min)   GFR calc Af Amer >90  >90 (mL/min)  CBC     Status: Abnormal   Collection Time   04/30/11  5:00 AM      Component Value Range   WBC 7.7  4.0 - 10.5 (K/uL)   RBC 4.49  3.87 - 5.11 (MIL/uL)   Hemoglobin 10.4 (*) 12.0 - 15.0 (g/dL)   HCT 04.5 (*) 40.9 - 46.0 (%)   MCV 71.5 (*) 78.0 - 100.0 (fL)   MCH 23.2 (*) 26.0 - 34.0 (pg)   MCHC 32.4  30.0 - 36.0 (g/dL)   RDW 81.1 (*) 91.4 - 15.5 (%)   Platelets 96 (*) 150 - 400 (K/uL)  GLUCOSE, CAPILLARY     Status: Abnormal   Collection Time   04/30/11  7:24 AM      Component Value Range   Glucose-Capillary 277 (*) 70 - 99 (mg/dL)   Comment 1 Notify RN    GLUCOSE, CAPILLARY     Status: Abnormal   Collection Time   04/30/11 11:31 AM      Component Value Range    Glucose-Capillary 305 (*) 70 - 99 (mg/dL)   Comment 1 Notify RN     Recent Results (from the past 240 hour(s))  URINE CULTURE     Status: Normal   Collection Time   04/27/11  8:48 PM      Component Value Range Status Comment   Specimen Description URINE, RANDOM   Final    Special Requests NONE   Final    Setup Time [id number]   Final    Colony Count 4,000 COLONIES/ML   Final    Culture INSIGNIFICANT GROWTH   Final    Report Status 04/29/2011 FINAL   Final   MRSA PCR SCREENING     Status: Normal   Collection Time   04/28/11  1:54 AM      Component Value Range Status Comment   MRSA by PCR NEGATIVE  NEGATIVE  Final   CULTURE, BLOOD (ROUTINE X 2)     Status: Normal (Preliminary result)   Collection Time   04/28/11  2:50 AM      Component Value Range Status Comment   Specimen Description BLOOD LEFT ANTECUBITAL   Final    Special Requests BOTTLES DRAWN AEROBIC AND ANAEROBIC 5CC   Final    Setup Time [id number]   Final    Culture     Final    Value:        BLOOD CULTURE RECEIVED NO GROWTH TO DATE CULTURE WILL BE HELD FOR 5 DAYS BEFORE ISSUING A FINAL NEGATIVE REPORT   Report Status PENDING   Incomplete   CULTURE, BLOOD (ROUTINE X 2)     Status: Normal (Preliminary result)   Collection Time   04/28/11  3:00 AM      Component Value Range Status Comment   Specimen Description BLOOD RIGHT ANTECUBITAL   Final    Special Requests BOTTLES DRAWN AEROBIC AND ANAEROBIC 5CC   Final    Setup Time [id number]   Final    Culture     Final    Value:        BLOOD CULTURE RECEIVED NO GROWTH TO DATE CULTURE WILL BE HELD FOR 5 DAYS BEFORE ISSUING  A FINAL NEGATIVE REPORT   Report Status PENDING   Incomplete     Studies/Results: No results found.  Medications: Scheduled Meds:   . enoxaparin  40 mg Subcutaneous Q24H  . insulin aspart  0-5 Units Subcutaneous QHS  . insulin aspart  0-9 Units Subcutaneous TID WC  . insulin aspart  3 Units Subcutaneous TID WC  . insulin glargine  10 Units  Subcutaneous QAM  . lithium carbonate  300 mg Oral TID  . living well with diabetes book   Does not apply Once  . metoCLOPramide (REGLAN) injection  10 mg Intravenous Q8H  . pantoprazole (PROTONIX) IV  40 mg Intravenous Q24H  . DISCONTD: insulin glargine  10 Units Subcutaneous QAM   Continuous Infusions:   . sodium chloride 150 mL/hr at 04/30/11 1345  . dextrose 5 % and 0.45% NaCl 100 mL/hr at 04/29/11 0800   PRN Meds:.acetaminophen, acetaminophen, dextrose, HYDROcodone-acetaminophen, morphine injection, ondansetron  Assessment/Plan: Principal Problem:  *DKA (diabetic ketoacidoses): Patient's anion gap is not yet closed but it is improving. She continues to have low bicarbonate/acidosis. I will increase her basal insulin dose and change her to moderate sliding scale and meal coverage. Active Problems:  DM (diabetes mellitus): The hemoglobin A1c was 14.8. The diabetes coordinator is consulting. She cannot afford Lantus insulin and will need to be discharged home on an affordable insulin regimen. The diabetes coordinator is supposed to make recommendations for change in her insulin. She will need prescriptions for a glucometer and affordable testing strips. Bipolar 2 disorder: Continue lithium. Urine drug screen only showed opiates which patient was taking at the time of the exam.  Cirrhosis of liver: Patient's liver function studies are stable.  Bacteria in urine: Urine culture is negative.  Seizure disorder: Patient has not had any seizure events.  Microcytic anemia: The patient reports that she continues to menstruate which is the likely source of her anemia. Would not start iron right now since she is still nauseated.  Hypokalemia: Replete.  Hillery Aldo, MD Pager [phone]    LOS: 3 days

## 2011-05-01 LAB — BASIC METABOLIC PANEL
BUN: 5 mg/dL — ABNORMAL LOW (ref 6–23)
Chloride: 99 mEq/L (ref 96–112)
Creatinine, Ser: 0.33 mg/dL — ABNORMAL LOW (ref 0.50–1.10)
GFR calc Af Amer: >90 mL/min (ref >90)
Glucose, Bld: 216 mg/dL — ABNORMAL HIGH (ref 70–99)

## 2011-05-01 LAB — GLUCOSE, CAPILLARY: Glucose-Capillary: 301 mg/dL — ABNORMAL HIGH (ref 70–99)

## 2011-05-01 MED ORDER — INSULIN NPH ISOPHANE & REGULAR (70-30) 100 UNIT/ML ~~LOC~~ SUSP: 15.0000 [IU] | Freq: Two times a day (BID) | SUBCUTANEOUS | Status: DC

## 2011-05-01 MED ORDER — INSULIN ASPART 100 UNIT/ML ~~LOC~~ SOLN: SUBCUTANEOUS | Status: DC

## 2011-05-01 NOTE — Progress Notes (Signed)
CBGs 223, 301 today. Recommend 70/30 15 units bid for home.  Will also need Novolog correction until [facility] appt. On 05/08/2011.

## 2011-05-01 NOTE — Progress Notes (Signed)
[facility] Hospital Discharge Summary  Name: Mary [name] MRN: [id number] DOB: 07-03-1970 40 y.o. PCP:  No primary provider on file.  Date of Admission: 04/27/2011  8:05 PM Admitter: @ADMITPROV @ Date of Discharge: 05/01/2011 Attending Physician: Pleas Koch, MD  Discharge Diagnosis: Principal Problem:  *DKA (diabetic ketoacidoses) Active Problems:  DM (diabetes mellitus)  Bipolar 2 disorder  Seizure disorder  Cirrhosis of liver  Bacteria in urine  Microcytic anemia  Hypokalemia   Consultations: Treatment Team:  Christina Rama  Procedures Performed:  Dg Chest Portable 1 View  04/27/2011  *RADIOLOGY REPORT*  Clinical Data: Chest pain.  PORTABLE CHEST - 1 VIEW  Comparison: Chest x-ray 03/13/2011.  Findings: The cardiac silhouette, mediastinal and hilar contours are within normal limits and stable.  The lungs are clear.  The bony thorax is intact.  IMPRESSION: No acute cardiopulmonary findings.  Original Report Authenticated By: P. Loralie Champagne, M.D.   Dg Abd Acute W/chest  04/25/2011  *RADIOLOGY REPORT*  Clinical Data: Epigastric pain, vomiting  ACUTE ABDOMEN SERIES (ABDOMEN 2 VIEW & CHEST 1 VIEW)  Comparison: 04/09/2011, 03/26/2011  Findings: Normal heart size and vascularity.  Scoliosis of the spine evident.  Negative for pneumonia, collapse, consolidation, effusion, or pneumothorax.  No free air evident.  Relative paucity of bowel gas.  Retained stool throughout the colon compatible with a degree of constipation.  Negative for obstruction or ileus.  No acute osseous finding.  IMPRESSION: No acute chest or abdominal process.  Retained stool throughout the colon compatible with constipation.  Original Report Authenticated By: Judie Petit. Ruel Favors, M.D.   Dg Abd Acute W/chest  04/09/2011  *RADIOLOGY REPORT*  Clinical Data: Upper abdominal pain and nausea.  History of hepatitis C.  ACUTE ABDOMEN SERIES (ABDOMEN 2 VIEW & CHEST 1 VIEW)  Comparison: Chest and abdominal radiographs  performed 03/26/2011, and CT of the abdomen and pelvis performed 03/26/2011  Findings: The lungs are well-aerated and clear.  There is no evidence of focal opacification, pleural effusion or pneumothorax. The cardiomediastinal silhouette is within normal limits.  The visualized bowel gas pattern is unremarkable. Stool and air are noted throughout the colon; there is no evidence of small bowel dilatation to suggest obstruction.  No free intra-abdominal air is identified on the provided upright view.  No acute osseous abnormalities are seen; the sacroiliac joints are unremarkable in appearance.  Mild right convex thoracic scoliosis and mild left convex thoracolumbar scoliosis are noted.  IMPRESSION:  1.  Unremarkable bowel gas pattern; no free intra-abdominal air seen. 2.  No acute cardiopulmonary process identified. 3.  Mild right convex thoracic scoliosis and mild left convex thoracolumbar scoliosis noted.  Original Report Authenticated By: Tonia Ghent, M.D.    History-  See Admission H&P below  Admission HPI: This is a 40 year old female who has known history of diabetes mellitus and medical noncompliance. She presents today with complaints of generalized body pain. She states pain is in the chest abdomen and her back. She reports she's had nausea vomiting, no hematemesis. She reports no fevers. The she states this has been going on for 2 days. She states she has not taken out insulin in 2 days. Here in the ER labs done review the patient has DKA.  Review of Systems: (positives bolded)  The patient denies anorexia, fever, weight loss,, vision loss, decreased hearing, hoarseness, chest pain, syncope, dyspnea on exertion, peripheral edema, balance deficits, hemoptysis, abdominal pain, melena, hematochezia, severe indigestion/heartburn, hematuria, incontinence, genital sores, muscle weakness, suspicious skin lesions, transient blindness, difficulty  walking, depression, unusual weight change, abnormal  bleeding, enlarged lymph nodes, angioedema, and breast masses.  Hospital Course by problem list: Patient Active Hospital Problem List: DKA (diabetic ketoacidoses) (04/27/2011)   Assessment: Was admitted with new onset abdominal pain initially and was found to be in DKA. Patient was admitted to the ICU and kept on insulin infusion protocol and subsequently weaned off this and changed over to Lantus.  Noted that patient has poor social support and husband "drinks all day"-diabetes case management was asked to assist in her care and recommended placing her on 7030 insulin given the fact that this is what they usually use that helps her. An appointment has been setup for her aspirate this dictation for house of eligibility appointment. She's been given prescriptions for the same and is very conversant on using insulin. Diabetic nurse educator graciously agreed to teacher sliding scale insulin coverage as well in addition to 7030 insulin coverage. Her A1c on this admission was about 14.     DM (diabetes mellitus) (04/27/2011)   Assessment: See above     Bipolar 2 disorder (04/27/2011)   Assessment: Will continue on her psychotropic medications, including her lithium     Cirrhosis of liver (04/28/2011)   Assessment:  and carries this diagnosis. She will need followup with yearly or half yearly LFTs and a screening ultrasound per USPTF guidelines.  She may need hepatitis C hepatitis B and other serologies as an outpatient if not ever done     Bacteria in urine (04/28/2011)   Assessment: patient had asymptomatic bacteriuria growing 4000 colony-forming units in hospital and this was not treated.    Microcytic anemia (04/29/2011)  patient does have menstrual periods and is likely the cause of her anemia. She may benefit from a CBC in one to 2 week     Hypokalemia (04/30/2011)   Assessment:    Plan:  patient had significant dislikes light anemia initially with a anion gap acidosis which resolved while  in hospital. Accompanying this was  low magnesium and low potassium which have been currently resolved.  She will benefit from followup in the outpatient setting and close monitoring. I will also prescribe as an add-on glucometer and strips   NB-A. she was seen to 3 days prior to this presentation and thought to have had possible cholecystitis. She had just plain abdominal film. I would recommend following up her LFTs as an outpatient in one to 3 weeks and if this is the case she may benefit from interval cholecystectomy. At present time she is currently asymptomatic completely and likely would benefit from just clear moderate diet for now.   LOS: 4 days     Discharge Vitals & PE:  BP 117/73  Pulse 76  Temp(Src) 98 F (36.7 C) (Oral)  Resp 18  Ht 5\' 4"  (1.626 m)  Wt 53.207 kg (117 lb 4.8 oz)  BMI 20.13 kg/m2  SpO2 98%  LMP 03/19/2011  2HEENT-poor dentition arcus senilis  CARDS- normal sinus rhythm no murmur ABD-soft nontender nondistended NEURO-grossly intact    Discharge Labs:  Results for orders placed during the hospital encounter of 04/27/11 (from the past 24 hour(s))  GLUCOSE, CAPILLARY     Status: Abnormal   Collection Time   04/30/11  4:48 PM      Component Value Range   Glucose-Capillary 227 (*) 70 - 99 (mg/dL)   Comment 1 Notify RN    GLUCOSE, CAPILLARY     Status: Abnormal   Collection Time  04/30/11  9:13 PM      Component Value Range   Glucose-Capillary 120 (*) 70 - 99 (mg/dL)   Comment 1 Notify RN    BASIC METABOLIC PANEL     Status: Abnormal   Collection Time   05/01/11  5:20 AM      Component Value Range   Sodium 132 (*) 135 - 145 (mEq/L)   Potassium 3.1 (*) 3.5 - 5.1 (mEq/L)   Chloride 99  96 - 112 (mEq/L)   CO2 22  19 - 32 (mEq/L)   Glucose, Bld 216 (*) 70 - 99 (mg/dL)   BUN 5 (*) 6 - 23 (mg/dL)   Creatinine, Ser 1.61 (*) 0.50 - 1.10 (mg/dL)   Calcium 9.3  8.4 - 09.6 (mg/dL)   GFR calc non Af Amer >90  >90 (mL/min)   GFR calc Af Amer >90  >90  (mL/min)  GLUCOSE, CAPILLARY     Status: Abnormal   Collection Time   05/01/11  7:28 AM      Component Value Range   Glucose-Capillary 223 (*) 70 - 99 (mg/dL)   Comment 1 Notify RN    GLUCOSE, CAPILLARY     Status: Abnormal   Collection Time   05/01/11 11:59 AM      Component Value Range   Glucose-Capillary 301 (*) 70 - 99 (mg/dL)   Comment 1 Notify RN      Disposition and follow-up:   [name] was discharged from Hospital in good condition.    Follow-up Appointments: Discharge Orders    Future Orders Please Complete By Expires   Diet - low sodium heart healthy      Increase activity slowly      Discharge instructions      Comments:   Follow up for your [facility] on 05-08-11 at 0945   Call MD for:  severe uncontrolled pain      Call MD for:  persistant nausea and vomiting      Call MD for:  persistant dizziness or light-headedness      Call MD for:  extreme fatigue      (HEART FAILURE PATIENTS) Call MD:  Anytime you have any of the following symptoms: 1) 3 pound weight gain in 24 hours or 5 pounds in 1 week 2) shortness of breath, with or without a dry hacking cough 3) swelling in the hands, feet or stomach 4) if you have to sleep on extra pillows at night in order to breathe.         Discharge Medications: Current Discharge Medication List    START taking these medications   Details  insulin NPH-insulin regular (NOVOLIN 70/30) (70-30) 100 UNIT/ML injection Inject 15 Units into the skin 2 (two) times daily with a meal. Qty: 10 mL, Refills: 12      CONTINUE these medications which have CHANGED   Details  insulin aspart (NOVOLOG) 100 UNIT/ML injection If Blood sugar 150-250, give yourself 2 units If blood sugar 251-350, give yourself 5 units of insulin If your blood sugar is above 351, take 7 units of insulin and call your MD Qty: 1 vial, Refills: 0      CONTINUE these medications which have NOT CHANGED   Details  aspirin EC 81 MG tablet  Take 81 mg by mouth daily.     lithium carbonate (LITHOBID) 300 MG CR tablet Take 300 mg by mouth 3 (three) times daily.     metoCLOPramide (REGLAN) 10 MG tablet Take 1  tablet (10 mg total) by mouth every 6 (six) hours. Qty: 30 tablet, Refills: 0    pantoprazole (PROTONIX) 40 MG tablet Take 40 mg by mouth daily.     magnesium gluconate (MAGONATE) 500 MG tablet Take 500 mg by mouth 2 (two) times daily.        STOP taking these medications     insulin glargine (LANTUS) 100 UNIT/ML injection      insulin glargine (LANTUS) 100 UNIT/ML injection        Medications Discontinued During This Encounter  Medication Reason  . 0.9 %  sodium chloride infusion   . insulin glargine (LANTUS) injection 15 Units   . magnesium gluconate (MAGONATE) tablet 500 mg   . morphine 2 MG/ML injection 2 mg   . insulin regular (HUMULIN R,NOVOLIN R) 1 Units/mL in sodium chloride 0.9 % 100 mL infusion Duplicate  . insulin glargine (LANTUS) injection 10 Units Duplicate  . insulin aspart (novoLOG) injection 0-9 Units   . insulin aspart (novoLOG) injection 3 Units   . insulin glargine (LANTUS) injection 10 Units   . dextrose 5 %-0.45 % sodium chloride infusion   . insulin aspart (novoLOG) injection 0-5 Units   . insulin glargine (LANTUS) 100 UNIT/ML injection Stop Taking at Discharge  . insulin aspart (NOVOLOG) 100 UNIT/ML injection Stop Taking at Discharge  . insulin glargine (LANTUS) 100 UNIT/ML injection Stop Taking at Discharge  . insulin aspart (NOVOLOG) 100 UNIT/ML injection Stop Taking at Discharge     Signed: Giulliana Mcroberts,JAI 05/01/2011, 3:25 PM

## 2011-05-01 NOTE — Progress Notes (Signed)
Pt to be d/c'd on 70/30 15 units bid and Novolog correction tidwc and hs:  If cbg <150 - no insulin.  If cbg 151-250 give 2 units. If cbg 251 - 350 give 5 units.  Call MD if > 400.  Pt. Voiced understanding of mixed insulin and Novolog schedule.  Verbalized understanding of hypoglycemia treatment.  Answered questions and pt. To followup with [facility] on 05/08/2011.

## 2011-05-04 LAB — CULTURE, BLOOD (ROUTINE X 2)
Culture  Setup Time: 201211111048
Culture: NO GROWTH

## 2011-05-12 ENCOUNTER — Encounter (HOSPITAL_COMMUNITY): Payer: Self-pay | Admitting: Emergency Medicine

## 2011-05-12 ENCOUNTER — Inpatient Hospital Stay (HOSPITAL_COMMUNITY)
Admission: EM | Admit: 2011-05-12 | Discharge: 2011-05-16 | DRG: 638 | Disposition: A | Discharge status: Home / Self Care | Payer: Medicaid Other | Attending: Internal Medicine | Admitting: Internal Medicine

## 2011-05-12 DIAGNOSIS — N390 Urinary tract infection, site not specified: Secondary | ICD-10-CM | POA: Diagnosis present

## 2011-05-12 DIAGNOSIS — D509 Iron deficiency anemia, unspecified: Secondary | ICD-10-CM

## 2011-05-12 DIAGNOSIS — F17200 Nicotine dependence, unspecified, uncomplicated: Secondary | ICD-10-CM | POA: Diagnosis present

## 2011-05-12 DIAGNOSIS — Z91199 Patient's noncompliance with other medical treatment and regimen due to unspecified reason: Secondary | ICD-10-CM

## 2011-05-12 DIAGNOSIS — Z794 Long term (current) use of insulin: Secondary | ICD-10-CM

## 2011-05-12 DIAGNOSIS — E876 Hypokalemia: Secondary | ICD-10-CM

## 2011-05-12 DIAGNOSIS — E1110 Type 2 diabetes mellitus with ketoacidosis without coma: Secondary | ICD-10-CM | POA: Diagnosis present

## 2011-05-12 DIAGNOSIS — F319 Bipolar disorder, unspecified: Secondary | ICD-10-CM | POA: Diagnosis present

## 2011-05-12 DIAGNOSIS — E860 Dehydration: Secondary | ICD-10-CM | POA: Diagnosis present

## 2011-05-12 DIAGNOSIS — G40909 Epilepsy, unspecified, not intractable, without status epilepticus: Secondary | ICD-10-CM | POA: Diagnosis present

## 2011-05-12 DIAGNOSIS — R109 Unspecified abdominal pain: Secondary | ICD-10-CM | POA: Diagnosis present

## 2011-05-12 DIAGNOSIS — R1110 Vomiting, unspecified: Secondary | ICD-10-CM | POA: Diagnosis present

## 2011-05-12 DIAGNOSIS — E119 Type 2 diabetes mellitus without complications: Secondary | ICD-10-CM

## 2011-05-12 DIAGNOSIS — E1010 Type 1 diabetes mellitus with ketoacidosis without coma: Secondary | ICD-10-CM | POA: Diagnosis present

## 2011-05-12 DIAGNOSIS — B1920 Unspecified viral hepatitis C without hepatic coma: Secondary | ICD-10-CM | POA: Diagnosis present

## 2011-05-12 DIAGNOSIS — F3181 Bipolar II disorder: Secondary | ICD-10-CM

## 2011-05-12 DIAGNOSIS — K7460 Unspecified cirrhosis of liver: Secondary | ICD-10-CM | POA: Diagnosis present

## 2011-05-12 DIAGNOSIS — Z9119 Patient's noncompliance with other medical treatment and regimen: Secondary | ICD-10-CM

## 2011-05-12 DIAGNOSIS — R8271 Bacteriuria: Secondary | ICD-10-CM

## 2011-05-12 DIAGNOSIS — B182 Chronic viral hepatitis C: Secondary | ICD-10-CM

## 2011-05-12 HISTORY — DX: Epilepsy, unspecified, not intractable, without status epilepticus: G40.909

## 2011-05-12 LAB — BASIC METABOLIC PANEL
Chloride: 107 mEq/L (ref 96–112)
Creatinine, Ser: 0.43 mg/dL — ABNORMAL LOW (ref 0.50–1.10)
GFR calc Af Amer: >90 mL/min (ref >90)
Potassium: 4.1 mEq/L (ref 3.5–5.1)

## 2011-05-12 LAB — URINALYSIS, ROUTINE W REFLEX MICROSCOPIC
Glucose, UA: >1000 mg/dL — AB
Ketones, ur: >80 mg/dL — AB
Leukocytes, UA: NEGATIVE
Protein, ur: NEGATIVE mg/dL
Urobilinogen, UA: 0.2 mg/dL (ref 0.0–1.0)

## 2011-05-12 LAB — COMPREHENSIVE METABOLIC PANEL
Albumin: 4.2 g/dL (ref 3.5–5.2)
Alkaline Phosphatase: 126 U/L — ABNORMAL HIGH (ref 39–117)
BUN: 16 mg/dL (ref 6–23)
CO2: 16 mEq/L — ABNORMAL LOW (ref 19–32)
Chloride: 95 mEq/L — ABNORMAL LOW (ref 96–112)
Creatinine, Ser: 0.46 mg/dL — ABNORMAL LOW (ref 0.50–1.10)
GFR calc Af Amer: >90 mL/min (ref >90)
GFR calc non Af Amer: >90 mL/min (ref >90)
Glucose, Bld: 438 mg/dL — ABNORMAL HIGH (ref 70–99)
Potassium: 4.4 mEq/L (ref 3.5–5.1)
Total Bilirubin: 0.3 mg/dL (ref 0.3–1.2)

## 2011-05-12 LAB — DIFFERENTIAL
Basophils Relative: 0 % (ref 0–1)
Lymphocytes Relative: 16 % (ref 12–46)
Lymphs Abs: 1.8 10*3/uL (ref 0.7–4.0)
Monocytes Absolute: 0.1 10*3/uL (ref 0.1–1.0)
Monocytes Relative: 1 % — ABNORMAL LOW (ref 3–12)
Neutro Abs: 9.1 10*3/uL — ABNORMAL HIGH (ref 1.7–7.7)
Neutrophils Relative %: 82 % — ABNORMAL HIGH (ref 43–77)

## 2011-05-12 LAB — BLOOD GAS, VENOUS
Bicarbonate: 15.6 mEq/L — ABNORMAL LOW (ref 20.0–24.0)
O2 Saturation: 84.8 %
Patient temperature: 98.6
pH, Ven: 7.269 (ref 7.250–7.300)

## 2011-05-12 LAB — CBC
HCT: 41.6 % (ref 36.0–46.0)
Hemoglobin: 13.1 g/dL (ref 12.0–15.0)
MCHC: 31.5 g/dL (ref 30.0–36.0)
RBC: 5.53 MIL/uL — ABNORMAL HIGH (ref 3.87–5.11)

## 2011-05-12 LAB — GLUCOSE, CAPILLARY
Glucose-Capillary: 456 mg/dL — ABNORMAL HIGH (ref 70–99)
Glucose-Capillary: 370 mg/dL — ABNORMAL HIGH (ref 70–99)
Glucose-Capillary: 276 mg/dL — ABNORMAL HIGH (ref 70–99)
Glucose-Capillary: 247 mg/dL — ABNORMAL HIGH (ref 70–99)

## 2011-05-12 LAB — MRSA PCR SCREENING: MRSA by PCR: INVALID — AB

## 2011-05-12 LAB — URINE MICROSCOPIC-ADD ON

## 2011-05-12 MED ORDER — SODIUM CHLORIDE 0.9 % IV SOLN
INTRAVENOUS | Status: DC
  Administered 2011-05-12: 3.1 [IU]/h via INTRAVENOUS
  Filled 2011-05-12: qty 1

## 2011-05-12 MED ORDER — SODIUM CHLORIDE 0.9 % IV SOLN
INTRAVENOUS | Status: DC
  Filled 2011-05-12: qty 1

## 2011-05-12 MED ORDER — SODIUM CHLORIDE 0.9 % IV BOLUS (SEPSIS)
1000.0000 mL | Freq: Once | INTRAVENOUS | Status: AC
  Administered 2011-05-12: 1000 mL via INTRAVENOUS

## 2011-05-12 MED ORDER — MORPHINE SULFATE 2 MG/ML IJ SOLN
2.0000 mg | INTRAMUSCULAR | Status: DC | PRN
  Administered 2011-05-13: 2 mg via INTRAVENOUS

## 2011-05-12 MED ORDER — DEXTROSE 50 % IV SOLN: 25.0000 mL | INTRAVENOUS | Status: DC | PRN

## 2011-05-12 MED ORDER — DEXTROSE-NACL 5-0.45 % IV SOLN
INTRAVENOUS | Status: DC
  Administered 2011-05-12 – 2011-05-13 (×2): via INTRAVENOUS

## 2011-05-12 MED ORDER — ONDANSETRON HCL 4 MG/2ML IJ SOLN
4.0000 mg | Freq: Once | INTRAMUSCULAR | Status: AC
  Administered 2011-05-12: 4 mg via INTRAVENOUS
  Filled 2011-05-12: qty 2

## 2011-05-12 MED ORDER — ONDANSETRON HCL 4 MG/2ML IJ SOLN
4.0000 mg | Freq: Four times a day (QID) | INTRAMUSCULAR | Status: DC | PRN
  Administered 2011-05-13 – 2011-05-15 (×3): 4 mg via INTRAVENOUS
  Filled 2011-05-12 (×3): qty 2

## 2011-05-12 MED ORDER — PANTOPRAZOLE SODIUM 40 MG IV SOLR
40.0000 mg | INTRAVENOUS | Status: DC
  Administered 2011-05-12: 40 mg via INTRAVENOUS
  Filled 2011-05-12 (×2): qty 40

## 2011-05-12 MED ORDER — POTASSIUM CHLORIDE 10 MEQ/100ML IV SOLN: 10.0000 meq | INTRAVENOUS | Status: DC

## 2011-05-12 MED ORDER — ENOXAPARIN SODIUM 40 MG/0.4ML ~~LOC~~ SOLN
40.0000 mg | SUBCUTANEOUS | Status: DC
  Administered 2011-05-12 – 2011-05-15 (×4): 40 mg via SUBCUTANEOUS
  Filled 2011-05-12 (×5): qty 0.4

## 2011-05-12 MED ORDER — HYDROMORPHONE HCL PF 2 MG/ML IJ SOLN
INTRAMUSCULAR | Status: AC
  Administered 2011-05-12: 0.5 mg via INTRAVENOUS
  Filled 2011-05-12: qty 1

## 2011-05-12 MED ORDER — LITHIUM CARBONATE ER 300 MG PO TBCR
300.0000 mg | EXTENDED_RELEASE_TABLET | Freq: Three times a day (TID) | ORAL | Status: DC
  Administered 2011-05-12 – 2011-05-16 (×11): 300 mg via ORAL
  Filled 2011-05-12 (×13): qty 1

## 2011-05-12 MED ORDER — SODIUM CHLORIDE 0.9 % IV SOLN: INTRAVENOUS | Status: DC

## 2011-05-12 MED ORDER — HYDROMORPHONE HCL PF 1 MG/ML IJ SOLN: 0.5000 mg | Freq: Once | INTRAMUSCULAR | Status: DC

## 2011-05-12 MED ORDER — MAGNESIUM GLUCONATE 500 MG PO TABS
500.0000 mg | ORAL_TABLET | Freq: Two times a day (BID) | ORAL | Status: DC
  Administered 2011-05-12 – 2011-05-16 (×8): 500 mg via ORAL
  Filled 2011-05-12 (×11): qty 1

## 2011-05-12 NOTE — ED Notes (Signed)
Multiple attempts made at 2nd IV est. Dr. Patria Mane est EJ. Pt tolerated well

## 2011-05-12 NOTE — ED Notes (Signed)
EMS called to home.  Found patient laying on sofa complaining of abdominal pain Cramping pain that is intermittent. Last BM yesterday.  No IV access. CBG 410.

## 2011-05-12 NOTE — ED Provider Notes (Signed)
Medical screening examination/treatment/procedure(s) were conducted as a shared visit with non-physician practitioner(s) and myself.  I personally evaluated the patient during the encounter  Will admit for DKA. Started on an insulin gtt. hospitalist to admit  Results for orders placed during the hospital encounter of 05/12/11  GLUCOSE, CAPILLARY      Component Value Range   Glucose-Capillary 456 (*) 70 - 99 (mg/dL)  CBC      Component Value Range   WBC 11.0 (*) 4.0 - 10.5 (K/uL)   RBC 5.53 (*) 3.87 - 5.11 (MIL/uL)   Hemoglobin 13.1  12.0 - 15.0 (g/dL)   HCT 11.9  14.7 - 82.9 (%)   MCV 75.2 (*) 78.0 - 100.0 (fL)   MCH 23.7 (*) 26.0 - 34.0 (pg)   MCHC 31.5  30.0 - 36.0 (g/dL)   RDW 56.2 (*) 13.0 - 15.5 (%)   Platelets 242  150 - 400 (K/uL)  DIFFERENTIAL      Component Value Range   Neutrophils Relative 82 (*) 43 - 77 (%)   Neutro Abs 9.1 (*) 1.7 - 7.7 (K/uL)   Lymphocytes Relative 16  12 - 46 (%)   Lymphs Abs 1.8  0.7 - 4.0 (K/uL)   Monocytes Relative 1 (*) 3 - 12 (%)   Monocytes Absolute 0.1  0.1 - 1.0 (K/uL)   Eosinophils Relative 0  0 - 5 (%)   Eosinophils Absolute 0.0  0.0 - 0.7 (K/uL)   Basophils Relative 0  0 - 1 (%)   Basophils Absolute 0.0  0.0 - 0.1 (K/uL)  COMPREHENSIVE METABOLIC PANEL      Component Value Range   Sodium 138  135 - 145 (mEq/L)   Potassium 4.4  3.5 - 5.1 (mEq/L)   Chloride 95 (*) 96 - 112 (mEq/L)   CO2 16 (*) 19 - 32 (mEq/L)   Glucose, Bld 438 (*) 70 - 99 (mg/dL)   BUN 16  6 - 23 (mg/dL)   Creatinine, Ser 8.65 (*) 0.50 - 1.10 (mg/dL)   Calcium 78.4 (*) 8.4 - 10.5 (mg/dL)   Total Protein 8.6 (*) 6.0 - 8.3 (g/dL)   Albumin 4.2  3.5 - 5.2 (g/dL)   AST 65 (*) 0 - 37 (U/L)   ALT 60 (*) 0 - 35 (U/L)   Alkaline Phosphatase 126 (*) 39 - 117 (U/L)   Total Bilirubin 0.3  0.3 - 1.2 (mg/dL)   GFR calc non Af Amer >90  >90 (mL/min)   GFR calc Af Amer >90  >90 (mL/min)  URINALYSIS, ROUTINE W REFLEX MICROSCOPIC      Component Value Range   Color, Urine  YELLOW  YELLOW    Appearance CLOUDY (*) CLEAR    Specific Gravity, Urine 1.030  1.005 - 1.030    pH 5.0  5.0 - 8.0    Glucose, UA >1000 (*) NEGATIVE (mg/dL)   Hgb urine dipstick NEGATIVE  NEGATIVE    Bilirubin Urine NEGATIVE  NEGATIVE    Ketones, ur >80 (*) NEGATIVE (mg/dL)   Protein, ur NEGATIVE  NEGATIVE (mg/dL)   Urobilinogen, UA 0.2  0.0 - 1.0 (mg/dL)   Nitrite NEGATIVE  NEGATIVE    Leukocytes, UA NEGATIVE  NEGATIVE   BLOOD GAS, VENOUS      Component Value Range   pH, Ven 7.269  7.250 - 7.300    pCO2, Ven 35.2 (*) 45.0 - 50.0 (mmHg)   pO2, Ven 57.1 (*) 30.0 - 45.0 (mmHg)   Bicarbonate 15.6 (*) 20.0 - 24.0 (mEq/L)  TCO2 14.3  0 - 100 (mmol/L)   Acid-base deficit 10.1 (*) 0.0 - 2.0 (mmol/L)   O2 Saturation 84.8     Patient temperature 98.6     Drawn by COLLECTED BY LABORATORY     Sample type VENOUS    URINE MICROSCOPIC-ADD ON      Component Value Range   Squamous Epithelial / LPF FEW (*) RARE    WBC, UA 0-2  <3 (WBC/hpf)   RBC / HPF 0-2  <3 (RBC/hpf)   Bacteria, UA MANY (*) RARE   GLUCOSE, CAPILLARY      Component Value Range   Glucose-Capillary 370 (*) 70 - 99 (mg/dL)      Lyanne Co, MD 05/12/11 1725

## 2011-05-12 NOTE — ED Provider Notes (Addendum)
History     CSN: [id number] Arrival date & time: 05/12/2011  1:54 PM   First MD Initiated Contact with Patient 05/12/11 1356      Chief Complaint  Patient presents with  . Abdominal Pain    (Consider location/radiation/quality/duration/timing/severity/associated sxs/prior treatment) Patient is a 40 y.o. female presenting with abdominal pain. The history is provided by the patient.  Abdominal Pain The primary symptoms of the illness include abdominal pain, nausea and vomiting. The primary symptoms of the illness do not include fever. The current episode started yesterday. The onset of the illness was gradual.  Associated with: The patient has an extensive history of DKA and medication noncompliance.  Additional symptoms associated with the illness include frequency. Symptoms associated with the illness do not include chills.    Past Medical History  Diagnosis Date  . Diabetes mellitus   . Hep C w/o coma, chronic   . Kidney stone   . Seizure   . Gastritis   . Mental disorder   . Depression     Past Surgical History  Procedure Date  . Ankle surgery     No family history on file.  History  Substance Use Topics  . Smoking status: Current Everyday Smoker  . Smokeless tobacco: Not on file  . Alcohol Use:     OB History    Grav Para Term Preterm Abortions TAB SAB Ect Mult Living                  Review of Systems  Constitutional: Negative for fever and chills.  HENT: Negative.   Respiratory: Negative.   Cardiovascular: Negative.   Gastrointestinal: Positive for nausea, vomiting and abdominal pain.  Genitourinary: Positive for frequency.  Musculoskeletal: Negative.   Skin: Negative.   Neurological: Negative.     Allergies  Penicillins  Home Medications   Current Outpatient Rx  Name Route Sig Dispense Refill  . DOXYCYCLINE HYCLATE 100 MG PO TABS Oral Take 100 mg by mouth 2 (two) times daily. For 14 days on 01/26/11     . LITHIUM CARBONATE 300 MG PO TBCR  Oral Take 300 mg by mouth 3 (three) times daily.     Marland Kitchen METRONIDAZOLE 500 MG PO TABS Oral Take 500 mg by mouth 2 (two) times daily. For 14 days     . ASPIRIN EC 81 MG PO TBEC Oral Take 81 mg by mouth daily.     . INSULIN ASPART 100 UNIT/ML [place] SOLN  If Blood sugar 150-250, give yourself 2 units If blood sugar 251-350, give yourself 5 units of insulin If your blood sugar is above 351, take 7 units of insulin and call your MD 1 vial 0  . INSULIN ISOPHANE & REGULAR (70-30) 100 UNIT/ML [place] SUSP Subcutaneous Inject 15 Units into the skin 2 (two) times daily with a meal. 10 mL 12  . MAGNESIUM GLUCONATE 500 MG PO TABS Oral Take 500 mg by mouth 2 (two) times daily.      Marland Kitchen PANTOPRAZOLE SODIUM 40 MG PO TBEC Oral Take 40 mg by mouth daily.       BP 109/79  Pulse 84  Temp(Src) 97.9 F (36.6 C) (Oral)  Resp 25  SpO2 100%  LMP 04/18/2011  Physical Exam  Constitutional: She appears well-developed and well-nourished.       The patient is ill appearing, does not have eye contact, minimal cooperation on history or exam.  HENT:  Head: Normocephalic.       Oral mucosa dry.  Neck: Normal range of motion. Neck supple.  Cardiovascular: Normal rate and regular rhythm.   Pulmonary/Chest: Effort normal and breath sounds normal.  Abdominal: Soft. Bowel sounds are normal. There is generalized tenderness. There is no rebound and no guarding.  Musculoskeletal: Normal range of motion.  Neurological: She is alert. No cranial nerve deficit.  Skin: Skin is warm and dry. No rash noted.  Psychiatric: She has a normal mood and affect.    ED Course  Procedures (including critical care time)  Labs Reviewed  GLUCOSE, CAPILLARY - Abnormal; Notable for the following:    Glucose-Capillary 456 (*)    All other components within normal limits  POCT CBG MONITORING  CBC  DIFFERENTIAL  COMPREHENSIVE METABOLIC PANEL  BLOOD GAS, VENOUS  URINALYSIS, ROUTINE W REFLEX MICROSCOPIC   No results found.   No diagnosis  found.    MDM  Patient again in DKA. IV fluid boluses running, second IV ordered but having to wait for IV team after multiple failed attempts. Glucose stabilizer ordered, and [facility] page for admission.        Rodena Medin, PA 05/12/11 1633  Rodena Medin, PA 05/12/11 [id number]

## 2011-05-12 NOTE — ED Notes (Signed)
Attempted to call report to floor, RN unable to take report at this time, will return call to ED

## 2011-05-12 NOTE — H&P (Addendum)
PCP:   No primary provider on file.   Chief Complaint:  Vomiting and stomach pain  HPI: A 40 year old female with insulin requiring diabetes mellitus, hepatitis C cirrhosis and seizure disorder who has had multiple admissions for DKA. She returns today stating that her sugars have been running high for a few days she states that she has been taking her insulin but this morning started vomiting and then having abdominal pain. The pain is all throughout her abdomen and is currently 7/10 in intensity after receiving pain medication. No blood noticed in her vomitus. She has vomited in the ER recently. The patient states that she takes her insulin regularly but when I ask her how much she took this morning she states she did not take any. Last night she states she took 7 units of 70/30. Her discharge summary notes that she is supposed to be taking 15 units twice a day. Patient refuses to give me further history and states that it is making her pain worse.  Review of Systems:  No recent weight loss weight gain. No fever chills or sweats. No sore throat or earache but she does have sinus trouble. No shortness of breath cough or wheezing. No chest pain palpitations or pedal edema. GI is as mentioned in history of present illness. No dysuria or hematuria. No focal numbness or weakness. No anxiety or depression. No rash or easy bruising. She complains of pain in her legs and her back. No history of stroke or mini stroke. She has a seizure disorder.  Past Medical History: Past Medical History  Diagnosis Date  . Diabetes mellitus   . Hep C w/o coma, chronic   . Kidney stone   . Seizure disorder   . Gastritis   . Mental disorder   . Depression    Past Surgical History  Procedure Date  . Ankle surgery     Medications: Prior to Admission medications   Medication Sig Start Date End Date Taking? Authorizing Provider  aspirin EC 81 MG tablet Take 81 mg by mouth daily.    Yes Historical  Provider, MD  insulin aspart (NOVOLOG) 100 UNIT/ML injection If Blood sugar 150-250, give yourself 2 units If blood sugar 251-350, give yourself 5 units of insulin If your blood sugar is above 351, take 7 units of insulin and call your MD 05/01/11  Yes Pleas Koch, MD  insulin NPH-insulin regular (NOVOLIN 70/30) (70-30) 100 UNIT/ML injection She states this is based on a sliding scale as well.  05/01/11 04/30/12 Yes Pleas Koch, MD  lithium carbonate (LITHOBID) 300 MG CR tablet Take 300 mg by mouth 3 (three) times daily.    Yes Historical Provider, MD  magnesium gluconate (MAGONATE) 500 MG tablet Take 500 mg by mouth 2 (two) times daily.     Yes Historical Provider, MD  pantoprazole (PROTONIX) 40 MG tablet Take 40 mg by mouth daily.    Yes Historical Provider, MD    Allergies:   Allergies  Allergen Reactions  . Penicillins Rash    Social History:  reports that she has been smoking.  She does not have any smokeless tobacco history on file. Her alcohol and drug histories not on file.  Family History: History reviewed. No pertinent family history.  Physical Exam: Filed Vitals:   05/12/11 1356 05/12/11 1402 05/12/11 1707  BP:  109/79 132/86  Pulse: 100 84   Temp:  97.9 F (36.6 C)   TempSrc:  Oral   Resp:  25   SpO2:  100%    General appearance: alert and mild distress Head: Normocephalic, without obvious abnormality, atraumatic Throat: lips, mucosa, and tongue normal; teeth and gums normal Resp: clear to auscultation bilaterally Cardio: regular rate and rhythm, S1, S2 normal, no murmur, click, rub or gallop GI: tender diffusely with decreased bowel sounds. Non distended. no organomegaly.  Extremities: extremities normal, atraumatic, no cyanosis or edema Skin: Skin color, texture, turgor normal. No rashes or lesions Neurologic: Grossly normal   Labs on Admission:   Basename 05/12/11 1420  NA 138  K 4.4  CL 95*  CO2 16*  GLUCOSE 438*  BUN 16  CREATININE 0.46*    CALCIUM 10.7*  MG --  PHOS --    Basename 05/12/11 1420  AST 65*  ALT 60*  ALKPHOS 126*  BILITOT 0.3  PROT 8.6*  ALBUMIN 4.2   No results found for this basename: LIPASE:2,AMYLASE:2 in the last 72 hours  Basename 05/12/11 1420  WBC 11.0*  NEUTROABS 9.1*  HGB 13.1  HCT 41.6  MCV 75.2*  PLT 242   No results found for this basename: CKTOTAL:3,CKMB:3,CKMBINDEX:3,TROPONINI:3 in the last 72 hours No results found for this basename: TSH,T4TOTAL,FREET3,T3FREE,THYROIDAB in the last 72 hours No results found for this basename: VITAMINB12:2,FOLATE:2,FERRITIN:2,TIBC:2,IRON:2,RETICCTPCT:2 in the last 72 hours  Radiological Exams on Admission: Dg Chest Portable 1 View  04/27/2011  *RADIOLOGY REPORT*  Clinical Data: Chest pain.  PORTABLE CHEST - 1 VIEW  Comparison: Chest x-ray 03/13/2011.  Findings: The cardiac silhouette, mediastinal and hilar contours are within normal limits and stable.  The lungs are clear.  The bony thorax is intact.  IMPRESSION: No acute cardiopulmonary findings.  Original Report Authenticated By: P. Loralie Champagne, M.D.   Dg Abd Acute W/chest  04/25/2011  *RADIOLOGY REPORT*  Clinical Data: Epigastric pain, vomiting  ACUTE ABDOMEN SERIES (ABDOMEN 2 VIEW & CHEST 1 VIEW)  Comparison: 04/09/2011, 03/26/2011  Findings: Normal heart size and vascularity.  Scoliosis of the spine evident.  Negative for pneumonia, collapse, consolidation, effusion, or pneumothorax.  No free air evident.  Relative paucity of bowel gas.  Retained stool throughout the colon compatible with a degree of constipation.  Negative for obstruction or ileus.  No acute osseous finding.  IMPRESSION: No acute chest or abdominal process.  Retained stool throughout the colon compatible with constipation.  Original Report Authenticated By: Judie Petit. Ruel Favors, M.D.    EKG:   Assessment/Plan  DKA-an insulin drip has been started. We will continue this until anion gap is closed. Continue to follow metabolic  panels until anion gap is closed. We will need to discuss with her once again how much insulin she is really taking at home and whether she is taking it properly.   Dehydration-She is on her third liter of IV fluids. Will continue fluids until she is able to eat.  Vomiting and abdominal pain -Zofran and morphine when necessary. N.p.o. for now until she is off the insulin drip  Hepatitis C cirrhosis Seizure disorder Microcytic anemia  Eshika Reckart 05/12/2011, 5:57 PM

## 2011-05-12 NOTE — ED Provider Notes (Signed)
Angiocath insertion Date/Time: 05/12/2011 5:22 PM Performed by: Lyanne Co Authorized by: Lyanne Co Consent: Verbal consent obtained. Risks and benefits: risks, benefits and alternatives were discussed Consent given by: patient Required items: required blood products, implants, devices, and special equipment available Patient identity confirmed: arm band Time out: Immediately prior to procedure a "time out" was called to verify the correct patient, procedure, equipment, support staff and site/side marked as required. Preparation: Patient was prepped and draped in the usual sterile fashion. Patient tolerance: Patient tolerated the procedure well with no immediate complications. Comments: Right External Jugular Angiocath inserted. Good blood return and good flush    Lyanne Co, MD 05/12/11 [phone]

## 2011-05-12 NOTE — ED Notes (Signed)
Assumed care of pt, pt resting on stretcher with eyes closed, no further emesis noted at this time.

## 2011-05-12 NOTE — Plan of Care (Signed)
Problem: Consults Goal: Nutrition Consult-if indicated Outcome: Progressing Ordered on admission due to pt stating has lost more than 10 pounds in last month.

## 2011-05-13 DIAGNOSIS — E860 Dehydration: Secondary | ICD-10-CM | POA: Diagnosis present

## 2011-05-13 LAB — GLUCOSE, CAPILLARY
Glucose-Capillary: 127 mg/dL — ABNORMAL HIGH (ref 70–99)
Glucose-Capillary: 148 mg/dL — ABNORMAL HIGH (ref 70–99)
Glucose-Capillary: 161 mg/dL — ABNORMAL HIGH (ref 70–99)
Glucose-Capillary: 177 mg/dL — ABNORMAL HIGH (ref 70–99)
Glucose-Capillary: 184 mg/dL — ABNORMAL HIGH (ref 70–99)
Glucose-Capillary: 410 mg/dL — ABNORMAL HIGH (ref 70–99)
Glucose-Capillary: 136 mg/dL — ABNORMAL HIGH (ref 70–99)
Glucose-Capillary: 135 mg/dL — ABNORMAL HIGH (ref 70–99)

## 2011-05-13 LAB — BASIC METABOLIC PANEL
BUN: 12 mg/dL (ref 6–23)
CO2: 20 mEq/L (ref 19–32)
Calcium: 9.1 mg/dL (ref 8.4–10.5)
Chloride: 108 mEq/L (ref 96–112)
GFR calc Af Amer: >90 mL/min (ref >90)
GFR calc Af Amer: >90 mL/min (ref >90)
GFR calc non Af Amer: >90 mL/min (ref >90)
Glucose, Bld: 166 mg/dL — ABNORMAL HIGH (ref 70–99)
Potassium: 3.3 mEq/L — ABNORMAL LOW (ref 3.5–5.1)
Potassium: 3.4 mEq/L — ABNORMAL LOW (ref 3.5–5.1)
Sodium: 140 mEq/L (ref 135–145)
Sodium: 140 mEq/L (ref 135–145)
Sodium: 140 mEq/L (ref 135–145)

## 2011-05-13 LAB — CBC
HCT: 34.8 % — ABNORMAL LOW (ref 36.0–46.0)
Hemoglobin: 11 g/dL — ABNORMAL LOW (ref 12.0–15.0)
RBC: 4.69 MIL/uL (ref 3.87–5.11)
WBC: 11.1 10*3/uL — ABNORMAL HIGH (ref 4.0–10.5)

## 2011-05-13 LAB — LIPASE, BLOOD: Lipase: 20 U/L (ref 11–59)

## 2011-05-13 MED ORDER — PANTOPRAZOLE SODIUM 40 MG PO TBEC
40.0000 mg | DELAYED_RELEASE_TABLET | Freq: Every day | ORAL | Status: DC
  Administered 2011-05-13 – 2011-05-16 (×4): 40 mg via ORAL
  Filled 2011-05-13 (×4): qty 1

## 2011-05-13 MED ORDER — CIPROFLOXACIN HCL 500 MG PO TABS
500.0000 mg | ORAL_TABLET | Freq: Two times a day (BID) | ORAL | Status: DC
  Administered 2011-05-13 – 2011-05-16 (×7): 500 mg via ORAL
  Filled 2011-05-13 (×8): qty 1

## 2011-05-13 MED ORDER — INSULIN GLARGINE 100 UNIT/ML ~~LOC~~ SOLN
10.0000 [IU] | Freq: Every day | SUBCUTANEOUS | Status: DC
  Administered 2011-05-13: 10 [IU] via SUBCUTANEOUS
  Filled 2011-05-13: qty 3

## 2011-05-13 MED ORDER — POTASSIUM CHLORIDE CRYS ER 20 MEQ PO TBCR
40.0000 meq | EXTENDED_RELEASE_TABLET | Freq: Once | ORAL | Status: AC
  Administered 2011-05-13: 40 meq via ORAL
  Filled 2011-05-13: qty 2

## 2011-05-13 MED ORDER — MORPHINE SULFATE 4 MG/ML IJ SOLN
INTRAMUSCULAR | Status: AC
  Filled 2011-05-13: qty 1

## 2011-05-13 MED ORDER — MORPHINE SULFATE 4 MG/ML IJ SOLN
2.0000 mg | INTRAMUSCULAR | Status: DC | PRN
  Administered 2011-05-15 (×3): 2 mg via INTRAVENOUS
  Administered 2011-05-15: 23:00:00 via INTRAVENOUS
  Administered 2011-05-16 (×3): 2 mg via INTRAVENOUS
  Filled 2011-05-13 (×7): qty 1

## 2011-05-13 MED ORDER — INSULIN ASPART 100 UNIT/ML ~~LOC~~ SOLN
0.0000 [IU] | SUBCUTANEOUS | Status: DC
  Administered 2011-05-13: 4 [IU] via SUBCUTANEOUS
  Administered 2011-05-13: 3 [IU] via SUBCUTANEOUS
  Administered 2011-05-13: 20 [IU] via SUBCUTANEOUS
  Administered 2011-05-13 – 2011-05-14 (×2): 3 [IU] via SUBCUTANEOUS
  Administered 2011-05-14 (×2): 7 [IU] via SUBCUTANEOUS
  Administered 2011-05-14: 11 [IU] via SUBCUTANEOUS
  Administered 2011-05-14 – 2011-05-15 (×3): 7 [IU] via SUBCUTANEOUS
  Administered 2011-05-15 (×2): 4 [IU] via SUBCUTANEOUS
  Administered 2011-05-15: 11 [IU] via SUBCUTANEOUS
  Administered 2011-05-15: 20 [IU] via SUBCUTANEOUS
  Administered 2011-05-16: 15 [IU] via SUBCUTANEOUS
  Administered 2011-05-16: 11 [IU] via SUBCUTANEOUS
  Administered 2011-05-16: 4 [IU] via SUBCUTANEOUS
  Filled 2011-05-13: qty 3

## 2011-05-13 MED ORDER — SODIUM CHLORIDE 0.9 % IV SOLN
INTRAVENOUS | Status: DC
  Administered 2011-05-13 – 2011-05-14 (×3): via INTRAVENOUS

## 2011-05-13 MED ORDER — INSULIN ASPART PROT & ASPART (70-30 MIX) 100 UNIT/ML ~~LOC~~ SUSP
10.0000 [IU] | Freq: Two times a day (BID) | SUBCUTANEOUS | Status: DC
  Administered 2011-05-13 – 2011-05-14 (×4): 10 [IU] via SUBCUTANEOUS
  Filled 2011-05-13 (×2): qty 3

## 2011-05-13 MED ORDER — INSULIN GLARGINE 100 UNIT/ML ~~LOC~~ SOLN: 10.0000 [IU] | Freq: Every day | SUBCUTANEOUS | Status: DC

## 2011-05-13 NOTE — Progress Notes (Signed)
Inpatient Diabetes Program Recommendations  AACE/ADA: New Consensus Statement on Inpatient Glycemic Control (2009)  Target Ranges:  Prepandial:   less than 140 mg/dL      Peak postprandial:   less than 180 mg/dL (1-2 hours)      Critically ill patients:  140 - 180 mg/dL   Reason for Visit: Admitted with DKA  Inpatient Diabetes Program Recommendations Outpatient Referral: Patient desires OP Diabetes Ed referral. (She thought she had referral last adm and has been waiting to hear from [facility].)   Note: Talked with patient. When she attempted to purchase insulin at [facility], it was too expensive.  Apparently it was not a Rx for ReliOn 70/30 which is < $25/vial.  She has another eligibility appt at [facility] this Friday to give them "some more pieces of paper" to prove she qualifies for orange card and services through [facility].  May need ReliOn Rx 70/30 at discharge so she can get lower cost insulin until orange card approved. Patient really wants to go for OP diabetes ed. [facility] to [facility]-- would be covered by orange card.  Thank you.

## 2011-05-13 NOTE — Progress Notes (Signed)
CARE MANAGEMENT NOTE 05/13/2011  Patient:  Mary [name], Mary [name]   Account Number:  [id number]  Date Initiated:  05/13/2011  Documentation initiated by:  Jafari Mckillop  Subjective/Objective Assessment:   pt with bld sugar over 400 and ams with ketonuria     Action/Plan:   lives at home   Anticipated DC Date:  05/16/2011   Anticipated DC Plan:  HOME/SELF CARE  In-house referral  NA      DC Planning Services  NA      [facility] Choice  NA   Choice offered to / List presented to:  NA   DME arranged  NA      DME agency  NA     HH arranged  NA      HH agency  NA   Status of service:  In process, will continue to follow Medicare Important Message given?  NA - LOS <3 / Initial given by admissions (If response is "NO", the following Medicare IM given date fields will be blank) Date Medicare IM given:  05/13/2011 Date Additional Medicare IM given:  05/13/2011  Discharge Disposition:  HOME/SELF CARE  Per UR Regulation:  Reviewed for med. necessity/level of care/duration of stay  Comments:  [id number] Earlene Plater, RN, BSN, CCM/CHART REVIEW FOR UR PERFORMED.

## 2011-05-13 NOTE — Plan of Care (Signed)
Problem: Food- and Nutrition-Related Knowledge Deficit (NB-1.1) Goal: Nutrition education Formal process to instruct or train a patient/client in a skill or to impart knowledge to help patients/clients voluntarily manage or modify food choices and eating behavior to maintain or improve health.  Outcome: Progressing Discussed CHO counting with pt who feels she does not know how to adequately count CHOs and dose her insulin. Reviewed handout with pt explaining sources of CHO in diet, and how to estimate amount of CHO consumed.  Discussed CHO goals at each meal and snack with pt.  Pt is able to demonstrate knowledge acquired by building sample meals with appropriate amount of CHO.  Verbalized understanding.  Discussed the importance of diet and medication in the management of DM.  Pt again verbalizes understanding.  Expect good compliance as pt truly seems to desire to improve management and wellbeing/health.  Encouraged pt to contact RD via RN if needed.

## 2011-05-13 NOTE — Progress Notes (Signed)
Subjective: Feels better this morning, no vomiting overnight, abdominal pain is less.  Objective: Vital signs in last 24 hours: Temp:  [97.1 F (36.2 C)-98.4 F (36.9 C)] 97.1 F (36.2 C) (11/26 0800) Pulse Rate:  [63-100] 63  (11/26 0400) Resp:  [10-25] 10  (11/26 0400) BP: (107-132)/(63-86) 107/63 mmHg (11/26 0400) SpO2:  [100 %] 100 % (11/26 0400) Weight:  [48.5 kg (106 lb 14.8 oz)-51.4 kg (113 lb 5.1 oz)] 113 lb 5.1 oz (51.4 kg) (11/26 0400) Weight change:  Last BM Date: 05/12/11  Intake/Output from previous day: 11/25 0701 - 11/26 0700 In: 1145 [I.V.:1145] Out: 450 [Urine:450]     Physical Exam: General: Comfortable, alert, communicative, fully oriented, not short of breath at rest.  HEENT:  No clinical pallor, no jaundice, no conjunctival injection or discharge. Hydration appears fair. NECK:  Supple, JVP not seen, no carotid bruits, no palpable lymphadenopathy, no palpable goiter. CHEST:  Clinically clear to auscultation, no wheezes, no crackles. HEART:  Sounds 1 and 2 heard, normal, regular, no murmurs. ABDOMEN:  Flat, soft, has mild epigastric tenderness, no palpable organomegaly, no palpable masses, normal bowel sounds. LOWER EXTREMITIES:  No pitting edema, palpable peripheral pulses. MUSCULOSKELETAL SYSTEM:  Generalized osteoarthritic changes, otherwise, normal. CENTRAL NERVOUS SYSTEM:  No focal neurologic deficit on gross examination.  Lab Results:  Basename 05/13/11 0006 05/12/11 1420  WBC 11.1* 11.0*  HGB 11.0* 13.1  HCT 34.8* 41.6  PLT 206 242    Basename 05/13/11 0410 05/13/11 0215  NA 140 140  K 3.4* 3.3*  CL 108 108  CO2 21 20  GLUCOSE 194* 166*  BUN 12 12  CREATININE 0.42* 0.41*  CALCIUM 9.2 9.0   Recent Results (from the past 240 hour(s))  MRSA PCR SCREENING     Status: Abnormal   Collection Time   05/12/11  9:50 PM      Component Value Range Status Comment   MRSA by PCR INVALID RESULTS, SPECIMEN SENT FOR CULTURE (*) NEGATIVE  Final       Studies/Results: No results found.  Medications: Scheduled Meds:   . enoxaparin  40 mg Subcutaneous Q24H  . HYDROmorphone      .  HYDROmorphone (DILAUDID) injection  0.5 mg Intravenous Once  . insulin aspart  0-20 Units Subcutaneous Q4H  . insulin glargine  10 Units Subcutaneous QHS  . lithium carbonate  300 mg Oral TID  . magnesium gluconate  500 mg Oral BID  . morphine      . ondansetron (ZOFRAN) IV  4 mg Intravenous Once  . ondansetron (ZOFRAN) IV  4 mg Intravenous Once  . pantoprazole (PROTONIX) IV  40 mg Intravenous Q24H  . sodium chloride  1,000 mL Intravenous Once  . DISCONTD: insulin glargine  10 Units Subcutaneous QHS  . DISCONTD: potassium chloride  10 mEq Intravenous Q1H   Continuous Infusions:   . dextrose 5 % and 0.45% NaCl 125 mL/hr at 05/13/11 0400  . DISCONTD: sodium chloride Stopped (05/12/11 2045)  . DISCONTD: insulin (NOVOLIN-R) infusion 1.2 Units/hr (05/13/11 0328)  . DISCONTD: insulin (NOVOLIN-R) infusion 1.7 Units/hr (05/12/11 2150)   PRN Meds:.dextrose, morphine injection, ondansetron (ZOFRAN) IV, DISCONTD:  morphine injection  Assessment/Plan: * Principal Problem* DKA: This is due to non =compliance with medication. Patient was discharged from hospitalization approximately 2 weeks ago, on Novolin (70/30) 15 units b.i.d. and Novolog SSI. According to her, she has been unable to afford Novolin, and has been utilizing only SSI. CBGs have now normalized, and calculated AG is  11.  We will transition patient to Novolog mix(70/30) as soon as feasible, and SSI. Active Problems:  1. Dehydration: Secondary to above. Now resolved. We shall continue maintenance iv fluids for now until oral intake is reliable. 2.Vomiting and abdominal pain: Improved. This is likely secondary to DKA, although acute gastritis vs pancreatitis have to be considered. We shall check lipase, place patient on PPI, utilize prn Zofran and advance diet gradually. 3. Hepatitis C cirrhosis:  Stable.  4. Seizure disorder: Asymptomatic. 5. UTI: Ciprofloxacin.   Comment: Patient will be transferred to Med-Surg floor.     LOS: 1 day   Mary [name],[name] 05/13/2011, 8:21 AM

## 2011-05-14 LAB — BASIC METABOLIC PANEL
Calcium: 9.2 mg/dL (ref 8.4–10.5)
Chloride: 108 mEq/L (ref 96–112)
Creatinine, Ser: 0.41 mg/dL — ABNORMAL LOW (ref 0.50–1.10)
GFR calc Af Amer: >90 mL/min (ref >90)
GFR calc non Af Amer: >90 mL/min (ref >90)

## 2011-05-14 LAB — GLUCOSE, CAPILLARY
Glucose-Capillary: 205 mg/dL — ABNORMAL HIGH (ref 70–99)
Glucose-Capillary: 255 mg/dL — ABNORMAL HIGH (ref 70–99)
Glucose-Capillary: 129 mg/dL — ABNORMAL HIGH (ref 70–99)

## 2011-05-14 LAB — LIPASE, BLOOD: Lipase: 60 U/L — ABNORMAL HIGH (ref 11–59)

## 2011-05-14 MED ORDER — INSULIN ASPART PROT & ASPART (70-30 MIX) 100 UNIT/ML ~~LOC~~ SUSP
15.0000 [IU] | Freq: Two times a day (BID) | SUBCUTANEOUS | Status: DC
  Administered 2011-05-14 – 2011-05-15 (×2): 15 [IU] via SUBCUTANEOUS

## 2011-05-14 MED ORDER — SODIUM CHLORIDE 0.9 % IJ SOLN
10.0000 mL | Freq: Two times a day (BID) | INTRAMUSCULAR | Status: DC
  Administered 2011-05-14 – 2011-05-16 (×4): 10 mL via INTRAVENOUS

## 2011-05-14 NOTE — Progress Notes (Signed)
Subjective: Asymptomatic. No new issues.  Objective: Vital signs in last 24 hours: Temp:  [98.3 F (36.8 C)-99.1 F (37.3 C)] 98.4 F (36.9 C) (11/27 0800) Pulse Rate:  [59-83] 62  (11/27 0800) Resp:  [12-21] 15  (11/27 0800) BP: (105-130)/(71-90) 117/77 mmHg (11/27 0800) SpO2:  [98 %-100 %] 100 % (11/27 0800) Weight change:  Last BM Date: 05/13/11  Intake/Output from previous day: 11/26 0701 - 11/27 0700 In: 2140 [P.O.:240; I.V.:1900] Out: 702 [Urine:702] Total I/O In: 200 [P.O.:100; I.V.:100] Out: 700 [Urine:700]   Physical Exam: General: Comfortable, alert, communicative, fully oriented, not short of breath at rest.  HEENT:  No clinical pallor, no jaundice, no conjunctival injection or discharge. Hydration appears fair. NECK:  Supple, JVP not seen, no carotid bruits, no palpable lymphadenopathy, no palpable goiter. CHEST:  Clinically clear to auscultation, no wheezes, no crackles. HEART:  Sounds 1 and 2 heard, normal, regular, no murmurs. ABDOMEN:  Flat, soft, has mild epigastric tenderness, no palpable organomegaly, no palpable masses, normal bowel sounds. LOWER EXTREMITIES:  No pitting edema, palpable peripheral pulses. MUSCULOSKELETAL SYSTEM:  Generalized osteoarthritic changes, otherwise, normal. CENTRAL NERVOUS SYSTEM:  No focal neurologic deficit on gross examination.  Lab Results:  Basename 05/13/11 0006 05/12/11 1420  WBC 11.1* 11.0*  HGB 11.0* 13.1  HCT 34.8* 41.6  PLT 206 242    Basename 05/14/11 0310 05/13/11 0410  NA 136 140  K 3.5 3.4*  CL 108 108  CO2 22 21  GLUCOSE 117* 194*  BUN 11 12  CREATININE 0.41* 0.42*  CALCIUM 9.2 9.2   Recent Results (from the past 240 hour(s))  MRSA PCR SCREENING     Status: Abnormal   Collection Time   05/12/11  9:50 PM      Component Value Range Status Comment   MRSA by PCR INVALID RESULTS, SPECIMEN SENT FOR CULTURE (*) NEGATIVE  Final   MRSA CULTURE     Status: Normal (Preliminary result)   Collection Time     05/12/11  9:50 PM      Component Value Range Status Comment   Specimen Description NOSE   Final    Special Requests NONE   Final    Culture NO SUSPICIOUS COLONIES, CONTINUING TO HOLD   Final    Report Status PENDING   Incomplete      Studies/Results: No results found.  Medications: Scheduled Meds:    . ciprofloxacin  500 mg Oral BID  . enoxaparin  40 mg Subcutaneous Q24H  .  HYDROmorphone (DILAUDID) injection  0.5 mg Intravenous Once  . insulin aspart  0-20 Units Subcutaneous Q4H  . insulin aspart protamine-insulin aspart  10 Units Subcutaneous BID WC  . lithium carbonate  300 mg Oral TID  . magnesium gluconate  500 mg Oral BID  . morphine      . pantoprazole  40 mg Oral Q1200   Continuous Infusions:    . DISCONTD: sodium chloride 100 mL/hr at 05/14/11 0716   PRN Meds:.dextrose, morphine injection, ondansetron (ZOFRAN) IV  Assessment/Plan: * Principal Problem* DKA: Resolved. This is due to non-compliance with medication. Patient was discharged from hospitalization approximately 2 weeks ago, on Novolin (70/30) 15 units b.i.d. and Novolog SSI. According to her, she has been unable to afford Novolin, and has been utilizing only SSI. Will reinstate pre-admission dose of Novolin (70/30) today. CSW will work with patient, to ensure affordability and compliance. Active Problems:  1. Dehydration: Secondary to above. Now resolved. We shall discontinue maintenance iv fluids. 2.Vomiting  and abdominal pain: Resolved. This is likely secondary to DKA. Lipase is normal. Tolerating diet. 3. Hepatitis C cirrhosis: Stable.  4. Seizure disorder: Asymptomatic. 5. UTI: On Ciprofloxacin, day# 2/7.   Comment: Patient will be transferred to Med-Surg floor ASAP.     LOS: 2 days   Mary [name],[name] 05/14/2011, 12:06 PM

## 2011-05-14 NOTE — Progress Notes (Signed)
Gave patient written information regarding $25 insulin at [facility].

## 2011-05-15 LAB — MRSA CULTURE

## 2011-05-15 LAB — GLUCOSE, CAPILLARY
Glucose-Capillary: 415 mg/dL — ABNORMAL HIGH (ref 70–99)
Glucose-Capillary: 265 mg/dL — ABNORMAL HIGH (ref 70–99)

## 2011-05-15 LAB — BASIC METABOLIC PANEL
Calcium: 8.8 mg/dL (ref 8.4–10.5)
Creatinine, Ser: 0.46 mg/dL — ABNORMAL LOW (ref 0.50–1.10)
GFR calc non Af Amer: >90 mL/min (ref >90)
Sodium: 132 mEq/L — ABNORMAL LOW (ref 135–145)

## 2011-05-15 LAB — GLUCOSE, RANDOM: Glucose, Bld: 253 mg/dL — ABNORMAL HIGH (ref 70–99)

## 2011-05-15 LAB — CBC
MCH: 23.5 pg — ABNORMAL LOW (ref 26.0–34.0)
MCHC: 31.8 g/dL (ref 30.0–36.0)
MCV: 73.8 fL — ABNORMAL LOW (ref 78.0–100.0)
Platelets: 142 10*3/uL — ABNORMAL LOW (ref 150–400)

## 2011-05-15 LAB — HEMOGLOBIN A1C: Hgb A1c MFr Bld: 14.3 % — ABNORMAL HIGH (ref <5.7)

## 2011-05-15 LAB — MAGNESIUM: Magnesium: 1.6 mg/dL (ref 1.5–2.5)

## 2011-05-15 MED ORDER — INSULIN ASPART 100 UNIT/ML ~~LOC~~ SOLN
15.0000 [IU] | Freq: Once | SUBCUTANEOUS | Status: DC
  Filled 2011-05-15: qty 3

## 2011-05-15 MED ORDER — INSULIN ASPART PROT & ASPART (70-30 MIX) 100 UNIT/ML ~~LOC~~ SUSP
20.0000 [IU] | Freq: Two times a day (BID) | SUBCUTANEOUS | Status: DC
  Administered 2011-05-15 – 2011-05-16 (×2): 20 [IU] via SUBCUTANEOUS

## 2011-05-15 NOTE — Progress Notes (Signed)
05-15-11 Spoke with patient at bedside. States she could not afford prescription insulin after last hospital discharge. Will need RX FOR RELION 70/30 which can be purchased at [facility] for 25$ per Diabetic Coordinator. Diabetes Coordinator's assistance and information has been appreciated. Will cont to follow as pt used indigent funds last admission which only is approved x1 within 12 months. Pt states "she thinks" her fiance can give her the money for her meds. Will likely need management approval for indigent funds to be used. If not approved by management, I will not be able to assist with meds. Pt states she has numerous prescription discount cards that help "some". Pt has eligibility appt with [facility] on this Friday to take additional paper work. MD appt with [facility] is on 07-01-11 at 230pm with Dr. Andrey Campanile. Will also give patient information regarding needymeds. Com for insulin assistance. [place], [place] [phone]

## 2011-05-15 NOTE — Progress Notes (Signed)
Patient ID: Mary [name], female   DOB: January 19, 1971, 40 y.o.   MRN: [id number] Subjective: Patient seen.Denies any complaints  Objective: Vital signs in last 24 hours: Temp:  [97.4 F (36.3 C)-98.8 F (37.1 C)] 97.9 F (36.6 C) (11/28 1340) Pulse Rate:  [74-76] 76  (11/28 1340) Resp:  [13-18] 18  (11/28 1340) BP: (115-136)/(71-96) 136/96 mmHg (11/28 1340) SpO2:  [99 %-100 %] 100 % (11/28 1340) Weight:  [55.5 kg (122 lb 5.7 oz)] 122 lb 5.7 oz (55.5 kg) (11/27 2234) Weight change:  Last BM Date: 05/14/11  Intake/Output from previous day: 11/27 0701 - 11/28 0700 In: 690 [P.O.:580; I.V.:110] Out: 2025 [Urine:2025] Total I/O In: 360 [P.O.:360] Out: -    Physical Exam: General: Comfortable,well hydrated and not in distress HEENT:  pallor, no jaundice, no conjunctival injection or discharge. NECK:  Supple, JVP not seen, no carotid bruits, no palpable lymphadenopathy, no palpable goiter. CHEST:  Clinically clear to auscultation, no wheezes, no crackles. HEART:  Sounds 1 and 2 heard, normal, regular, no murmurs. ABDOMEN:  Flat, soft, has mild epigastric tenderness, no palpable organomegaly, no palpable masses, normal bowel sounds. LOWER EXTREMITIES:  No pitting edema, palpable peripheral pulses. MUSCULOSKELETAL SYSTEM:  normal CENTRAL NERVOUS SYSTEM:  No focal neurologic deficit   Lab Results:  Basename 05/15/11 0225 05/13/11 0006  WBC 8.3 11.1*  HGB 10.4* 11.0*  HCT 32.7* 34.8*  PLT 142* 206    Basename 05/15/11 0225 05/14/11 0310  NA 132* 136  K 3.6 3.5  CL 100 108  CO2 22 22  GLUCOSE 240* 117*  BUN 11 11  CREATININE 0.46* 0.41*  CALCIUM 8.8 9.2   Recent Results (from the past 240 hour(s))  MRSA PCR SCREENING     Status: Abnormal   Collection Time   05/12/11  9:50 PM      Component Value Range Status Comment   MRSA by PCR INVALID RESULTS, SPECIMEN SENT FOR CULTURE (*) NEGATIVE  Final   MRSA CULTURE     Status: Normal   Collection Time   05/12/11  9:50 PM   Component Value Range Status Comment   Specimen Description NOSE   Final    Special Requests NONE   Final    Culture NO STAPHYLOCOCCUS AUREUS ISOLATED   Final    Report Status 05/15/2011 FINAL   Final      Studies/Results: No results found.  Medications: Scheduled Meds:    . ciprofloxacin  500 mg Oral BID  . enoxaparin  40 mg Subcutaneous Q24H  .  HYDROmorphone (DILAUDID) injection  0.5 mg Intravenous Once  . insulin aspart  0-20 Units Subcutaneous Q4H  . insulin aspart  15 Units Subcutaneous Once  . insulin aspart protamine-insulin aspart  20 Units Subcutaneous BID WC  . lithium carbonate  300 mg Oral TID  . magnesium gluconate  500 mg Oral BID  . pantoprazole  40 mg Oral Q1200  . sodium chloride  10 mL Intravenous Q12H  . DISCONTD: insulin aspart protamine-insulin aspart  15 Units Subcutaneous BID WC   Continuous Infusions:   PRN Meds:.dextrose, morphine injection, ondansetron (ZOFRAN) IV  Assessment/Plan: #1 DKA: Resolved Will reinstate pre-admission dose of Novolin (70/30) today. CSW will work with patient, to ensure affordability and compliance. #2 . Dehydration:hydration satisfaction #3 Vomiting and abdominal pain: Resolved.Tolerating po feeds #4. Hepatitis C cirrhosis: Stable.  #5 . Seizure disorder: Asymptomatic. #6  UTI: On Ciprofloxacin     LOS: 3 days   Shakeira Rhee 05/15/2011, 2:31 PM

## 2011-05-16 LAB — COMPREHENSIVE METABOLIC PANEL
ALT: 77 U/L — ABNORMAL HIGH (ref 0–35)
BUN: 10 mg/dL (ref 6–23)
CO2: 26 mEq/L (ref 19–32)
Calcium: 9.6 mg/dL (ref 8.4–10.5)
Creatinine, Ser: 0.5 mg/dL (ref 0.50–1.10)
GFR calc Af Amer: >90 mL/min (ref >90)
GFR calc non Af Amer: >90 mL/min (ref >90)
Glucose, Bld: 294 mg/dL — ABNORMAL HIGH (ref 70–99)
Sodium: 135 mEq/L (ref 135–145)
Total Protein: 6.3 g/dL (ref 6.0–8.3)

## 2011-05-16 LAB — CBC
HCT: 36.9 % (ref 36.0–46.0)
Hemoglobin: 11.5 g/dL — ABNORMAL LOW (ref 12.0–15.0)
MCH: 23.1 pg — ABNORMAL LOW (ref 26.0–34.0)
MCHC: 31.2 g/dL (ref 30.0–36.0)
MCV: 74.1 fL — ABNORMAL LOW (ref 78.0–100.0)
RBC: 4.98 MIL/uL (ref 3.87–5.11)

## 2011-05-16 LAB — GLUCOSE, CAPILLARY
Glucose-Capillary: 157 mg/dL — ABNORMAL HIGH (ref 70–99)
Glucose-Capillary: 326 mg/dL — ABNORMAL HIGH (ref 70–99)

## 2011-05-16 MED ORDER — CIPROFLOXACIN HCL 500 MG PO TABS: 500.0000 mg | ORAL_TABLET | Freq: Two times a day (BID) | ORAL | Status: AC

## 2011-05-16 MED ORDER — INSULIN NPH ISOPHANE & REGULAR (70-30) 100 UNIT/ML ~~LOC~~ SUSP: 25.0000 [IU] | Freq: Two times a day (BID) | SUBCUTANEOUS | Status: DC

## 2011-05-16 NOTE — Discharge Summary (Signed)
DISCHARGE SUMMARY  Mary [name]  MR#: [id number]  DOB:May 18, 1971  Date of Admission: 05/12/2011 Date of Discharge: 05/16/2011  Attending Physician:Treshawn Allen  Patient's PCP:No primary provider on file.  Consults:   Discharge Diagnoses: #1 Diabetic ketoacidosis #2 dehydration #3 UTI   #4 bipolar affective disorder. #5 hepatitis C positive. #6 history of seizure disorder #7 history of cirrhosis #8 microcytic anemia. #9 noncompliant with medication  Present on Admission:  .DKA (diabetic ketoacidoses) .Dehydration    Current Discharge Medication List    START taking these medications   Details  ciprofloxacin (CIPRO) 500 MG tablet Take 1 tablet (500 mg total) by mouth 2 (two) times daily. Qty: 10 tablet, Refills: 0      CONTINUE these medications which have CHANGED   Details  insulin NPH-insulin regular (NOVOLIN 70/30) (70-30) 100 UNIT/ML injection Inject 25 Units into the skin 2 (two) times daily with a meal. Qty: 10 mL, Refills: 12      CONTINUE these medications which have NOT CHANGED   Details  aspirin EC 81 MG tablet Take 81 mg by mouth daily.     insulin aspart (NOVOLOG) 100 UNIT/ML injection If Blood sugar 150-250, give yourself 2 units If blood sugar 251-350, give yourself 5 units of insulin If your blood sugar is above 351, take 7 units of insulin and call your MD Qty: 1 vial, Refills: 0    lithium carbonate (LITHOBID) 300 MG CR tablet Take 300 mg by mouth 3 (three) times daily.     magnesium gluconate (MAGONATE) 500 MG tablet Take 500 mg by mouth 2 (two) times daily.      metroNIDAZOLE (FLAGYL) 500 MG tablet Take 500 mg by mouth 2 (two) times daily. For 14 days     pantoprazole (PROTONIX) 40 MG tablet Take 40 mg by mouth daily.       STOP taking these medications     doxycycline (VIBRA-TABS) 100 MG tablet           Hospital Course: Patient is a 40 year old African American female with history of diabetes mellitus, bipolar  affective disorder as well as seizure was admitted to the hospital on 05/11/2002 by Dr. Haroldine Laws with complains of nausea and vomiting of about 2 days duration and this was said to be getting progressively worse .Patient was also said to have been dehydrated.She denies any history of chest pain or shortness of breath. No systemic symptoms.  she was found to be dehydrated and also  in DKA.     Patient was admitted to step down. She was started on IV hydration as well as IV insulin as per protocol for DKA. She was also treated for UTI with IV ciprofloxacin. Patient hydration improved markedly. Her blood sugar was controlled. IV insulin was discontinued and subsequently patient was started on 7030 insulin alongside with Accu-Cheks with regular insulin sliding scale. At time patient was seen by me, she was clinically improved. She reports no episode of diarrhea or vomiting. She also denied any systemic symptoms. Patient had  occasional spikes in her blood sugar level and subsequently her 70/30 insulin was adjusted. So far patient blood sugar level is fairly controlled. Hydration is improved markedly. Examination of patient is essential unremarkable. Vital signs are stable. Plan is for patient to be discharge home today. Present on Admission:  .DKA (diabetic ketoacidoses) .Dehydration:   Day of Discharge BP 105/79  Pulse 82  Temp(Src) 98 F (36.7 C) (Oral)  Resp 16  Ht 5\' 4"  (1.626 m)  Wt 55.5  kg (122 lb 5.7 oz)  BMI 21.00 kg/m2  SpO2 100%  LMP 04/18/2011  Physical Exam:vitals as above heent-mild pallor,perla Neck-no jvd Chest-clear cvs-s1 and s2 abd-soft,non tender and organs not palpable and bowel sounds are present Ext-no pedal edema Neuro-non focal Skin-normal turgor    Results for orders placed during the hospital encounter of 05/12/11 (from the past 24 hour(s))  GLUCOSE, CAPILLARY     Status: Abnormal   Collection Time   05/15/11 11:30 AM      Component Value Range    Glucose-Capillary 415 (*) 70 - 99 (mg/dL)   Comment 1 Documented in Chart     Comment 2 Notify RN    GLUCOSE, RANDOM     Status: Abnormal   Collection Time   05/15/11  1:49 PM      Component Value Range   Glucose, Bld 253 (*) 70 - 99 (mg/dL)  GLUCOSE, CAPILLARY     Status: Abnormal   Collection Time   05/15/11  4:06 PM      Component Value Range   Glucose-Capillary 182 (*) 70 - 99 (mg/dL)   Comment 1 Documented in Chart     Comment 2 Notify RN    GLUCOSE, CAPILLARY     Status: Abnormal   Collection Time   05/15/11  8:06 PM      Component Value Range   Glucose-Capillary 265 (*) 70 - 99 (mg/dL)   Comment 1 Notify RN    GLUCOSE, CAPILLARY     Status: Abnormal   Collection Time   05/16/11 12:29 AM      Component Value Range   Glucose-Capillary 326 (*) 70 - 99 (mg/dL)   Comment 1 Notify RN    GLUCOSE, CAPILLARY     Status: Abnormal   Collection Time   05/16/11  3:30 AM      Component Value Range   Glucose-Capillary 293 (*) 70 - 99 (mg/dL)   Comment 1 Notify RN    CBC     Status: Abnormal   Collection Time   05/16/11  3:40 AM      Component Value Range   WBC 7.4  4.0 - 10.5 (K/uL)   RBC 4.98  3.87 - 5.11 (MIL/uL)   Hemoglobin 11.5 (*) 12.0 - 15.0 (g/dL)   HCT 98.1  19.1 - 47.8 (%)   MCV 74.1 (*) 78.0 - 100.0 (fL)   MCH 23.1 (*) 26.0 - 34.0 (pg)   MCHC 31.2  30.0 - 36.0 (g/dL)   RDW 29.5 (*) 62.1 - 15.5 (%)   Platelets 129 (*) 150 - 400 (K/uL)  COMPREHENSIVE METABOLIC PANEL     Status: Abnormal   Collection Time   05/16/11  3:40 AM      Component Value Range   Sodium 135  135 - 145 (mEq/L)   Potassium 4.3  3.5 - 5.1 (mEq/L)   Chloride 101  96 - 112 (mEq/L)   CO2 26  19 - 32 (mEq/L)   Glucose, Bld 294 (*) 70 - 99 (mg/dL)   BUN 10  6 - 23 (mg/dL)   Creatinine, Ser 3.08  0.50 - 1.10 (mg/dL)   Calcium 9.6  8.4 - 65.7 (mg/dL)   Total Protein 6.3  6.0 - 8.3 (g/dL)   Albumin 3.0 (*) 3.5 - 5.2 (g/dL)   AST 846 (*) 0 - 37 (U/L)   ALT 77 (*) 0 - 35 (U/L)   Alkaline  Phosphatase 85  39 - 117 (U/L)   Total Bilirubin 0.4  0.3 - 1.2 (mg/dL)   GFR calc non Af Amer >90  >90 (mL/min)   GFR calc Af Amer >90  >90 (mL/min)  GLUCOSE, CAPILLARY     Status: Abnormal   Collection Time   05/16/11  7:41 AM      Component Value Range   Glucose-Capillary 157 (*) 70 - 99 (mg/dL)   Comment 1 Notify RN      Disposition: stable   Follow-up Appts: Discharge Orders    Future Orders Please Complete By Expires   Diet Carb Modified      Increase activity slowly      Discharge instructions      Comments:   Follow up pcp 1-2 weeks      Follow-up with primary care physician in 1-2weeks  Signed: Ewelina Naves 05/16/2011, 9:33 AM

## 2011-05-17 NOTE — ED Provider Notes (Signed)
Medical screening examination/treatment/procedure(s) were performed by non-physician practitioner and as supervising physician I was immediately available for consultation/collaboration.   Rolan Bucco, MD 05/17/11 [phone]

## 2011-06-01 ENCOUNTER — Encounter (HOSPITAL_COMMUNITY): Payer: Self-pay | Admitting: *Deleted

## 2011-06-01 ENCOUNTER — Other Ambulatory Visit: Payer: Self-pay

## 2011-06-01 ENCOUNTER — Inpatient Hospital Stay (HOSPITAL_COMMUNITY)
Admission: EM | Admit: 2011-06-01 | Discharge: 2011-06-05 | DRG: 639 | Disposition: A | Discharge status: Home / Self Care | Payer: Medicaid Other | Attending: Family Medicine | Admitting: Family Medicine

## 2011-06-01 DIAGNOSIS — F17200 Nicotine dependence, unspecified, uncomplicated: Secondary | ICD-10-CM | POA: Diagnosis present

## 2011-06-01 DIAGNOSIS — Z9114 Patient's other noncompliance with medication regimen: Secondary | ICD-10-CM

## 2011-06-01 DIAGNOSIS — E1310 Other specified diabetes mellitus with ketoacidosis without coma: Principal | ICD-10-CM | POA: Diagnosis present

## 2011-06-01 DIAGNOSIS — Z91199 Patient's noncompliance with other medical treatment and regimen due to unspecified reason: Secondary | ICD-10-CM

## 2011-06-01 DIAGNOSIS — F319 Bipolar disorder, unspecified: Secondary | ICD-10-CM | POA: Diagnosis present

## 2011-06-01 DIAGNOSIS — E1110 Type 2 diabetes mellitus with ketoacidosis without coma: Secondary | ICD-10-CM

## 2011-06-01 DIAGNOSIS — N183 Chronic kidney disease, stage 3 unspecified: Secondary | ICD-10-CM | POA: Diagnosis present

## 2011-06-01 DIAGNOSIS — G40909 Epilepsy, unspecified, not intractable, without status epilepticus: Secondary | ICD-10-CM | POA: Diagnosis present

## 2011-06-01 DIAGNOSIS — Z9119 Patient's noncompliance with other medical treatment and regimen: Secondary | ICD-10-CM

## 2011-06-01 DIAGNOSIS — E860 Dehydration: Secondary | ICD-10-CM | POA: Diagnosis present

## 2011-06-01 DIAGNOSIS — E1165 Type 2 diabetes mellitus with hyperglycemia: Secondary | ICD-10-CM | POA: Diagnosis present

## 2011-06-01 DIAGNOSIS — E1129 Type 2 diabetes mellitus with other diabetic kidney complication: Secondary | ICD-10-CM | POA: Diagnosis present

## 2011-06-01 DIAGNOSIS — B182 Chronic viral hepatitis C: Secondary | ICD-10-CM | POA: Diagnosis not present

## 2011-06-01 DIAGNOSIS — E876 Hypokalemia: Secondary | ICD-10-CM | POA: Diagnosis present

## 2011-06-01 DIAGNOSIS — Z720 Tobacco use: Secondary | ICD-10-CM | POA: Diagnosis present

## 2011-06-01 HISTORY — DX: Bipolar II disorder: F31.81

## 2011-06-01 HISTORY — DX: Post-traumatic stress disorder, unspecified: F43.10

## 2011-06-01 LAB — BASIC METABOLIC PANEL
BUN: 14 mg/dL (ref 6–23)
BUN: 11 mg/dL (ref 6–23)
Calcium: 9.9 mg/dL (ref 8.4–10.5)
Chloride: 103 mEq/L (ref 96–112)
Chloride: 104 mEq/L (ref 96–112)
Creatinine, Ser: 0.43 mg/dL — ABNORMAL LOW (ref 0.50–1.10)
Creatinine, Ser: 0.46 mg/dL — ABNORMAL LOW (ref 0.50–1.10)
GFR calc Af Amer: >90 mL/min (ref >90)
GFR calc Af Amer: >90 mL/min (ref >90)
GFR calc Af Amer: >90 mL/min (ref >90)
GFR calc non Af Amer: >90 mL/min (ref >90)
GFR calc non Af Amer: >90 mL/min (ref >90)
Potassium: 3.6 mEq/L (ref 3.5–5.1)
Potassium: 4.3 mEq/L (ref 3.5–5.1)

## 2011-06-01 LAB — DIFFERENTIAL
Basophils Relative: 0 % (ref 0–1)
Eosinophils Relative: 0 % (ref 0–5)
Lymphocytes Relative: 26 % (ref 12–46)
Monocytes Absolute: 0.1 10*3/uL (ref 0.1–1.0)
Monocytes Relative: 2 % — ABNORMAL LOW (ref 3–12)
Neutro Abs: 4.9 10*3/uL (ref 1.7–7.7)

## 2011-06-01 LAB — URINALYSIS, ROUTINE W REFLEX MICROSCOPIC
Ketones, ur: >80 mg/dL — AB
Leukocytes, UA: NEGATIVE
Nitrite: NEGATIVE
Protein, ur: NEGATIVE mg/dL
Urobilinogen, UA: 0.2 mg/dL (ref 0.0–1.0)

## 2011-06-01 LAB — CBC
HCT: 43.2 % (ref 36.0–46.0)
Hemoglobin: 13.8 g/dL (ref 12.0–15.0)
MCHC: 31.9 g/dL (ref 30.0–36.0)
MCV: 74.6 fL — ABNORMAL LOW (ref 78.0–100.0)

## 2011-06-01 LAB — COMPREHENSIVE METABOLIC PANEL
BUN: 16 mg/dL (ref 6–23)
CO2: 12 mEq/L — ABNORMAL LOW (ref 19–32)
Calcium: 10.3 mg/dL (ref 8.4–10.5)
Chloride: 90 mEq/L — ABNORMAL LOW (ref 96–112)
Creatinine, Ser: 0.49 mg/dL — ABNORMAL LOW (ref 0.50–1.10)
GFR calc non Af Amer: >90 mL/min (ref >90)
Total Bilirubin: 0.4 mg/dL (ref 0.3–1.2)

## 2011-06-01 LAB — BLOOD GAS, ARTERIAL
Acid-base deficit: 11.1 mmol/L — ABNORMAL HIGH (ref 0.0–2.0)
TCO2: 13.1 mmol/L (ref 0–100)
pCO2 arterial: 30.8 mmHg — ABNORMAL LOW (ref 35.0–45.0)
pH, Arterial: 7.284 — ABNORMAL LOW (ref 7.350–7.400)
pO2, Arterial: 110 mmHg — ABNORMAL HIGH (ref 80.0–100.0)

## 2011-06-01 LAB — GLUCOSE, CAPILLARY
Glucose-Capillary: 439 mg/dL — ABNORMAL HIGH (ref 70–99)
Glucose-Capillary: 352 mg/dL — ABNORMAL HIGH (ref 70–99)
Glucose-Capillary: 186 mg/dL — ABNORMAL HIGH (ref 70–99)
Glucose-Capillary: 286 mg/dL — ABNORMAL HIGH (ref 70–99)
Glucose-Capillary: 252 mg/dL — ABNORMAL HIGH (ref 70–99)
Glucose-Capillary: 138 mg/dL — ABNORMAL HIGH (ref 70–99)

## 2011-06-01 LAB — MRSA PCR SCREENING: MRSA by PCR: NEGATIVE

## 2011-06-01 LAB — LIPASE, BLOOD: Lipase: 26 U/L (ref 11–59)

## 2011-06-01 MED ORDER — SODIUM CHLORIDE 0.9 % IV SOLN: Freq: Once | INTRAVENOUS | Status: DC

## 2011-06-01 MED ORDER — SODIUM CHLORIDE 0.9 % IV BOLUS (SEPSIS)
1000.0000 mL | Freq: Once | INTRAVENOUS | Status: AC
  Administered 2011-06-01: 1000 mL via INTRAVENOUS

## 2011-06-01 MED ORDER — SODIUM CHLORIDE 0.9 % IV SOLN
Freq: Once | INTRAVENOUS | Status: AC
  Administered 2011-06-01: 11:00:00 via INTRAVENOUS

## 2011-06-01 MED ORDER — INSULIN REGULAR HUMAN 100 UNIT/ML IJ SOLN: 10.0000 [IU] | Freq: Once | INTRAMUSCULAR | Status: DC

## 2011-06-01 MED ORDER — SODIUM CHLORIDE 0.9 % IV SOLN
INTRAVENOUS | Status: DC
  Administered 2011-06-01: 5.8 [IU]/h via INTRAVENOUS
  Filled 2011-06-01: qty 1

## 2011-06-01 MED ORDER — MORPHINE SULFATE 2 MG/ML IJ SOLN
INTRAMUSCULAR | Status: AC
  Filled 2011-06-01: qty 2

## 2011-06-01 MED ORDER — HEPARIN SODIUM (PORCINE) 5000 UNIT/ML IJ SOLN
5000.0000 [IU] | Freq: Three times a day (TID) | INTRAMUSCULAR | Status: DC
  Administered 2011-06-01 – 2011-06-05 (×10): 5000 [IU] via SUBCUTANEOUS
  Filled 2011-06-01 (×15): qty 1

## 2011-06-01 MED ORDER — MORPHINE SULFATE 4 MG/ML IJ SOLN
4.0000 mg | Freq: Once | INTRAMUSCULAR | Status: AC
  Administered 2011-06-01: 4 mg via INTRAMUSCULAR

## 2011-06-01 MED ORDER — MORPHINE SULFATE 4 MG/ML IJ SOLN: 4.0000 mg | Freq: Once | INTRAMUSCULAR | Status: DC

## 2011-06-01 MED ORDER — LITHIUM CARBONATE ER 300 MG PO TBCR
300.0000 mg | EXTENDED_RELEASE_TABLET | Freq: Three times a day (TID) | ORAL | Status: DC
  Administered 2011-06-01 – 2011-06-05 (×11): 300 mg via ORAL
  Filled 2011-06-01 (×16): qty 1

## 2011-06-01 MED ORDER — DEXTROSE-NACL 5-0.45 % IV SOLN
INTRAVENOUS | Status: DC
  Administered 2011-06-01 – 2011-06-03 (×5): via INTRAVENOUS

## 2011-06-01 MED ORDER — SODIUM CHLORIDE 0.9 % IV SOLN: INTRAVENOUS | Status: DC

## 2011-06-01 MED ORDER — INSULIN ASPART 100 UNIT/ML ~~LOC~~ SOLN
10.0000 [IU] | Freq: Once | SUBCUTANEOUS | Status: AC
  Administered 2011-06-01: 10 [IU] via SUBCUTANEOUS
  Filled 2011-06-01: qty 1

## 2011-06-01 MED ORDER — ASPIRIN EC 81 MG PO TBEC
81.0000 mg | DELAYED_RELEASE_TABLET | Freq: Every day | ORAL | Status: DC
  Administered 2011-06-01 – 2011-06-05 (×5): 81 mg via ORAL
  Filled 2011-06-01 (×6): qty 1

## 2011-06-01 MED ORDER — MORPHINE SULFATE 2 MG/ML IJ SOLN
2.0000 mg | INTRAMUSCULAR | Status: DC | PRN
  Administered 2011-06-01 – 2011-06-02 (×3): 2 mg via INTRAVENOUS
  Filled 2011-06-01 (×3): qty 1

## 2011-06-01 MED ORDER — ONDANSETRON 8 MG PO TBDP
8.0000 mg | ORAL_TABLET | Freq: Once | ORAL | Status: AC
  Administered 2011-06-01: 8 mg via ORAL
  Filled 2011-06-01: qty 1

## 2011-06-01 MED ORDER — MORPHINE SULFATE 4 MG/ML IJ SOLN
4.0000 mg | INTRAMUSCULAR | Status: DC | PRN
  Administered 2011-06-01: 4 mg via INTRAMUSCULAR

## 2011-06-01 MED ORDER — DEXTROSE 50 % IV SOLN: 25.0000 mL | INTRAVENOUS | Status: DC | PRN

## 2011-06-01 MED ORDER — ONDANSETRON HCL 4 MG/2ML IJ SOLN: 4.0000 mg | Freq: Once | INTRAMUSCULAR | Status: DC

## 2011-06-01 MED ORDER — SODIUM CHLORIDE 0.9 % IV SOLN
INTRAVENOUS | Status: DC
  Administered 2011-06-01: 17:00:00 via INTRAVENOUS

## 2011-06-01 MED ORDER — POTASSIUM CHLORIDE 10 MEQ/100ML IV SOLN
10.0000 meq | INTRAVENOUS | Status: AC
  Administered 2011-06-01 (×2): 10 meq via INTRAVENOUS
  Filled 2011-06-01: qty 200

## 2011-06-01 MED ORDER — ONDANSETRON HCL 4 MG/2ML IJ SOLN
4.0000 mg | Freq: Four times a day (QID) | INTRAMUSCULAR | Status: DC | PRN
  Administered 2011-06-01: 4 mg via INTRAVENOUS
  Filled 2011-06-01: qty 2

## 2011-06-01 MED ORDER — SODIUM CHLORIDE 0.9 % IV SOLN
INTRAVENOUS | Status: DC
  Administered 2011-06-01: 3.8 [IU]/h via INTRAVENOUS
  Filled 2011-06-01: qty 1

## 2011-06-01 NOTE — ED Notes (Signed)
WUJ:[id number]<JY> Expected date:06/01/11<BR> Expected time: 7:44 AM<BR> Means of arrival:Ambulance<BR> Comments:<BR> Hyperglycemia/nausea

## 2011-06-01 NOTE — ED Notes (Signed)
Pt has an unresolved LDA from 05/12/11.  She presented to the ED today with no exsiting LDA.  I did not, however, complete the charting on the LDA because I was not the one who removed the line.  I did want to make note though, that it was not present upon this presentation to the ED.

## 2011-06-01 NOTE — ED Notes (Signed)
Pt states her abd diffuse and she is guarding.  She did not rate or describe.

## 2011-06-01 NOTE — ED Notes (Signed)
Pt was red faced and teary eyed. Pt stated that she was in a lot of pain and that she needed pain medicine now. Then, she preceded to yell that she needed it now. I told pt that she is being admitted and that we will get pain medicine to her as soon as we can.

## 2011-06-01 NOTE — ED Provider Notes (Signed)
History     CSN: [id number] Arrival date & time: 06/01/2011  7:46 AM   First MD Initiated Contact with Patient 06/01/11 [phone]      Chief Complaint  Patient presents with  . Hyperglycemia    greater than 500-meter only reads up to 500  . Abdominal Pain    began approx 0430 this am    (Consider location/radiation/quality/duration/timing/severity/associated sxs/prior treatment) Patient is a 40 y.o. female presenting with abdominal pain. The history is provided by the patient.  Abdominal Pain The primary symptoms of the illness include abdominal pain.  Patient states that she started having generalized abdominal pain last night. Pain is crampy and associated with nausea and vomiting. Nothing makes the pain better, nothing makes it worse. Pain is severe rated at 10 out of 10. She states that her blood sugars are running high yesterday but she cannot tell me how high they were. She states that she's been compliant with taking her insulin.  Past Medical History  Diagnosis Date  . Diabetes mellitus   . Hep C w/o coma, chronic   . Kidney stone   . Seizure disorder   . Gastritis   . Mental disorder   . Depression     Past Surgical History  Procedure Date  . Ankle surgery     History reviewed. No pertinent family history.  History  Substance Use Topics  . Smoking status: Current Everyday Smoker -- 0.2 packs/day  . Smokeless tobacco: Not on file  . Alcohol Use: No    OB History    Grav Para Term Preterm Abortions TAB SAB Ect Mult Living                  Review of Systems  Gastrointestinal: Positive for abdominal pain.  All other systems reviewed and are negative.    Allergies  Penicillins  Home Medications   Current Outpatient Rx  Name Route Sig Dispense Refill  . ASPIRIN EC 81 MG PO TBEC Oral Take 81 mg by mouth daily.     . INSULIN ASPART 100 UNIT/ML [place] SOLN  If Blood sugar 150-250, give yourself 2 units If blood sugar 251-350, give yourself 5 units of  insulin If your blood sugar is above 351, take 7 units of insulin and call your MD 1 vial 0  . INSULIN ISOPHANE & REGULAR (70-30) 100 UNIT/ML [place] SUSP Subcutaneous Inject 25 Units into the skin 2 (two) times daily with a meal. 10 mL 12  . LITHIUM CARBONATE ER 300 MG PO TBCR Oral Take 300 mg by mouth 3 (three) times daily.     Marland Kitchen MAGNESIUM GLUCONATE 500 MG PO TABS Oral Take 500 mg by mouth 2 (two) times daily.      Marland Kitchen METRONIDAZOLE 500 MG PO TABS Oral Take 500 mg by mouth 2 (two) times daily. For 14 days     . PANTOPRAZOLE SODIUM 40 MG PO TBEC Oral Take 40 mg by mouth daily.       SpO2 99%  LMP 04/18/2011  Physical Exam  Nursing note and vitals reviewed.  40 year old female appears uncomfortable. Vital signs are normal except for mild tachycardia with heart rate of 102. Head is normocephalic and atraumatic. PERRLA, EOMI. Oropharynx is clear. Neck is supple without adenopathy or JVD. Lungs are clear without rales, wheezes, rhonchi. Heart has regular rate and rhythm without murmur. Abdomen is soft, flat, with mild diffuse tenderness. There is no rebound or guarding. Peristalsis is hypoactive. Back is nontender. There's no CVA  tenderness. Extremities have no cyanosis or edema, full range of motion present. Skin is warm and moist without rash. Neurological: mental status is normal, cranial nerves are intact, there are no focal motor Center deficits.  ED Course  Procedures (including critical care time)   Labs Reviewed  CBC  DIFFERENTIAL  COMPREHENSIVE METABOLIC PANEL  LIPASE, BLOOD  URINALYSIS, ROUTINE W REFLEX MICROSCOPIC  POCT PREGNANCY, URINE   No results found. Results for orders placed during the hospital encounter of 06/01/11  CBC      Component Value Range   WBC 6.8  4.0 - 10.5 (K/uL)   RBC 5.79 (*) 3.87 - 5.11 (MIL/uL)   Hemoglobin 13.8  12.0 - 15.0 (g/dL)   HCT 52.8  41.3 - 24.4 (%)   MCV 74.6 (*) 78.0 - 100.0 (fL)   MCH 23.8 (*) 26.0 - 34.0 (pg)   MCHC 31.9  30.0 - 36.0 (g/dL)    RDW 01.0 (*) 27.2 - 15.5 (%)   Platelets 177  150 - 400 (K/uL)  DIFFERENTIAL      Component Value Range   Neutrophils Relative 71  43 - 77 (%)   Neutro Abs 4.9  1.7 - 7.7 (K/uL)   Lymphocytes Relative 26  12 - 46 (%)   Lymphs Abs 1.8  0.7 - 4.0 (K/uL)   Monocytes Relative 2 (*) 3 - 12 (%)   Monocytes Absolute 0.1  0.1 - 1.0 (K/uL)   Eosinophils Relative 0  0 - 5 (%)   Eosinophils Absolute 0.0  0.0 - 0.7 (K/uL)   Basophils Relative 0  0 - 1 (%)   Basophils Absolute 0.0  0.0 - 0.1 (K/uL)  COMPREHENSIVE METABOLIC PANEL      Component Value Range   Sodium 131 (*) 135 - 145 (mEq/L)   Potassium 5.2 (*) 3.5 - 5.1 (mEq/L)   Chloride 90 (*) 96 - 112 (mEq/L)   CO2 12 (*) 19 - 32 (mEq/L)   Glucose, Bld 524 (*) 70 - 99 (mg/dL)   BUN 16  6 - 23 (mg/dL)   Creatinine, Ser 5.36 (*) 0.50 - 1.10 (mg/dL)   Calcium 64.4  8.4 - 10.5 (mg/dL)   Total Protein 8.7 (*) 6.0 - 8.3 (g/dL)   Albumin 4.2  3.5 - 5.2 (g/dL)   AST 92 (*) 0 - 37 (U/L)   ALT 66 (*) 0 - 35 (U/L)   Alkaline Phosphatase 118 (*) 39 - 117 (U/L)   Total Bilirubin 0.4  0.3 - 1.2 (mg/dL)   GFR calc non Af Amer >90  >90 (mL/min)   GFR calc Af Amer >90  >90 (mL/min)  LIPASE, BLOOD      Component Value Range   Lipase 26  11 - 59 (U/L)  URINALYSIS, ROUTINE W REFLEX MICROSCOPIC      Component Value Range   Color, Urine YELLOW  YELLOW    APPearance CLEAR  CLEAR    Specific Gravity, Urine 1.031 (*) 1.005 - 1.030    pH 5.5  5.0 - 8.0    Glucose, UA >1000 (*) NEGATIVE (mg/dL)   Hgb urine dipstick SMALL (*) NEGATIVE    Bilirubin Urine NEGATIVE  NEGATIVE    Ketones, ur >80 (*) NEGATIVE (mg/dL)   Protein, ur NEGATIVE  NEGATIVE (mg/dL)   Urobilinogen, UA 0.2  0.0 - 1.0 (mg/dL)   Nitrite NEGATIVE  NEGATIVE    Leukocytes, UA NEGATIVE  NEGATIVE   POCT PREGNANCY, URINE      Component Value Range   Preg  Test, Ur NEGATIVE    URINE MICROSCOPIC-ADD ON      Component Value Range   Squamous Epithelial / LPF RARE  RARE    WBC, UA 0-2  <3  (WBC/hpf)   Bacteria, UA RARE  RARE    No results found.  Results for orders placed during the hospital encounter of 06/01/11  CBC      Component Value Range   WBC 6.8  4.0 - 10.5 (K/uL)   RBC 5.79 (*) 3.87 - 5.11 (MIL/uL)   Hemoglobin 13.8  12.0 - 15.0 (g/dL)   HCT 16.1  09.6 - 04.5 (%)   MCV 74.6 (*) 78.0 - 100.0 (fL)   MCH 23.8 (*) 26.0 - 34.0 (pg)   MCHC 31.9  30.0 - 36.0 (g/dL)   RDW 40.9 (*) 81.1 - 15.5 (%)   Platelets 177  150 - 400 (K/uL)  DIFFERENTIAL      Component Value Range   Neutrophils Relative 71  43 - 77 (%)   Neutro Abs 4.9  1.7 - 7.7 (K/uL)   Lymphocytes Relative 26  12 - 46 (%)   Lymphs Abs 1.8  0.7 - 4.0 (K/uL)   Monocytes Relative 2 (*) 3 - 12 (%)   Monocytes Absolute 0.1  0.1 - 1.0 (K/uL)   Eosinophils Relative 0  0 - 5 (%)   Eosinophils Absolute 0.0  0.0 - 0.7 (K/uL)   Basophils Relative 0  0 - 1 (%)   Basophils Absolute 0.0  0.0 - 0.1 (K/uL)  COMPREHENSIVE METABOLIC PANEL      Component Value Range   Sodium 131 (*) 135 - 145 (mEq/L)   Potassium 5.2 (*) 3.5 - 5.1 (mEq/L)   Chloride 90 (*) 96 - 112 (mEq/L)   CO2 12 (*) 19 - 32 (mEq/L)   Glucose, Bld 524 (*) 70 - 99 (mg/dL)   BUN 16  6 - 23 (mg/dL)   Creatinine, Ser 9.14 (*) 0.50 - 1.10 (mg/dL)   Calcium 78.2  8.4 - 10.5 (mg/dL)   Total Protein 8.7 (*) 6.0 - 8.3 (g/dL)   Albumin 4.2  3.5 - 5.2 (g/dL)   AST 92 (*) 0 - 37 (U/L)   ALT 66 (*) 0 - 35 (U/L)   Alkaline Phosphatase 118 (*) 39 - 117 (U/L)   Total Bilirubin 0.4  0.3 - 1.2 (mg/dL)   GFR calc non Af Amer >90  >90 (mL/min)   GFR calc Af Amer >90  >90 (mL/min)  LIPASE, BLOOD      Component Value Range   Lipase 26  11 - 59 (U/L)  URINALYSIS, ROUTINE W REFLEX MICROSCOPIC      Component Value Range   Color, Urine YELLOW  YELLOW    APPearance CLEAR  CLEAR    Specific Gravity, Urine 1.031 (*) 1.005 - 1.030    pH 5.5  5.0 - 8.0    Glucose, UA >1000 (*) NEGATIVE (mg/dL)   Hgb urine dipstick SMALL (*) NEGATIVE    Bilirubin Urine NEGATIVE   NEGATIVE    Ketones, ur >80 (*) NEGATIVE (mg/dL)   Protein, ur NEGATIVE  NEGATIVE (mg/dL)   Urobilinogen, UA 0.2  0.0 - 1.0 (mg/dL)   Nitrite NEGATIVE  NEGATIVE    Leukocytes, UA NEGATIVE  NEGATIVE   POCT PREGNANCY, URINE      Component Value Range   Preg Test, Ur NEGATIVE    URINE MICROSCOPIC-ADD ON      Component Value Range   Squamous Epithelial / LPF RARE  RARE    WBC, UA 0-2  <3 (WBC/hpf)   Bacteria, UA RARE  RARE    No results found.    No diagnosis found. She was treated with IV fluids, intravenous morphine and Zofran with some subjective improvement. Laboratory workup confirmed ketoacidosis and she was given IV insulin. Case is discussed with the [facility], who agrees to admit the patient.  CRITICAL CARE Performed by: Dione Booze   Total critical care time: 60 minutes  Critical care time was exclusive of separately billable procedures and treating other patients.  Critical care was necessary to treat or prevent imminent or life-threatening deterioration.  Critical care was time spent personally by me on the following activities: development of treatment plan with patient and/or surrogate as well as nursing, discussions with consultants, evaluation of patient's response to treatment, examination of patient, obtaining history from patient or surrogate, ordering and performing treatments and interventions, ordering and review of laboratory studies, ordering and review of radiographic studies, pulse oximetry and re-evaluation of patient's condition.    MDM  Abdominal pain, vomiting, hyperglycemia-need to rule out diabetic ketoacidosis. Review of prior records shows she has had an several hospital admissions for ketoacidosis.        Dione Booze, MD 06/01/11 1044

## 2011-06-01 NOTE — H&P (Signed)
PCP:- [facility]-Dr. Andrey Campanile  CC-N/V   HPI:-stomach started paining and got nauseated and started vomiting and had lots of pain. This all apprently started Last week. Had been feeling worse Friday morning-early on 12/15 started to vomit 5-6 times and continued to do so. Contained what she ate yesterday. No blood or other issues in the vomit, and then became watery., no assosc.fever, but was chilly-didn't check her temp. No reported diarrhoea. Has had multiple episodes of the same thing.no dysuria or other issues, but thinks has had a yeast infection prior to her period which started Thursday.. No dark stool or tarry stools either. Usually takes insulin every sinlge day, but ran out of her insulin yesterday--was taking her Novolin 70/30 15 twic daily. Takes novolin R insulin usually with meals  Checked sugars and were running 500-600's.  Has no means of paying for meds she states-has [place] card fro [facility]-Rx's are 10 dollars   Past Medical History  Diagnosis Date  . Diabetes mellitus     diagnosed in 1996-always beennon insulin  . Hep C w/o coma, chronic     biopsy in 2010-no rx-was supposed to se ea hepatologist in [place]  . Kidney stone   . Seizure disorder     started with pregnancy of first son  . Gastritis   . Bipolar 2 disorder   . Depression   . Post traumatic stress disorder     "flipping out" after people close to her died    Past Surgical History  Procedure Date  . Ankle surgery   . Cesarean section     Family History  Problem Relation Age of Onset  . Diabetes Mother     currentl;y 48  . Fibromyalgia Mother   . Cirrhosis Father     died in 81   Social History:  reports that she has been smoking.  She does not have any smokeless tobacco history on file. She reports that she does not drink alcohol or use illicit drugs.  Allergies:  Allergies  Allergen Reactions  . Penicillins Rash    Medications Prior to Admission  Medication Dose Route Frequency  Provider Last Rate Last Dose  . 0.9 %  sodium chloride infusion   Intravenous Once Dione Booze, MD 125 mL/hr at 06/01/11 1120    . 0.9 %  sodium chloride infusion   Intravenous Once Dione Booze, MD      . 0.9 %  sodium chloride infusion   Intravenous STAT Dione Booze, MD      . insulin aspart (novoLOG) injection 10 Units  10 Units Subcutaneous Once Dione Booze, MD   10 Units at 06/01/11 1025  . insulin regular (NOVOLIN R,HUMULIN R) 1 Units/mL in sodium chloride 0.9 % 100 mL infusion   Intravenous Continuous Dione Booze, MD 3.8 mL/hr at 06/01/11 1122 3.8 Units/hr at 06/01/11 1122  . morphine 2 MG/ML injection           . morphine 2 MG/ML injection           . morphine 4 MG/ML injection 4 mg  4 mg Intramuscular Once Dione Booze, MD   4 mg at 06/01/11 0845  . morphine 4 MG/ML injection 4 mg  4 mg Intramuscular Once Dione Booze, MD   4 mg at 06/01/11 1025  . ondansetron (ZOFRAN-ODT) disintegrating tablet 8 mg  8 mg Oral Once Dione Booze, MD   8 mg at 06/01/11 [phone]  . sodium chloride 0.9 % bolus 1,000 mL  1,000 mL Intravenous  Once Dione Booze, MD   1,000 mL at 06/01/11 1017  . DISCONTD: insulin regular (NOVOLIN R,HUMULIN R) 100 units/mL injection 10 Units  10 Units Subcutaneous Once Dione Booze, MD      . DISCONTD: insulin regular (NOVOLIN R,HUMULIN R) 100 units/mL injection 10 Units  10 Units Subcutaneous Once Dione Booze, MD      . DISCONTD: morphine 4 MG/ML injection 4 mg  4 mg Intravenous Once Dione Booze, MD      . DISCONTD: ondansetron [facility]) injection 4 mg  4 mg Intravenous Once Dione Booze, MD       Medications Prior to Admission  Medication Sig Dispense Refill  . aspirin EC 81 MG tablet Take 81 mg by mouth daily.       . insulin aspart (NOVOLOG) 100 UNIT/ML injection If Blood sugar 150-250, give yourself 2 units If blood sugar 251-350, give yourself 5 units of insulin If your blood sugar is above 351, take 7 units of insulin and call your MD  1 vial  0  . insulin NPH-insulin regular  (NOVOLIN 70/30) (70-30) 100 UNIT/ML injection Inject 25 Units into the skin 2 (two) times daily with a meal.  10 mL  12  . lithium carbonate (LITHOBID) 300 MG CR tablet Take 300 mg by mouth 3 (three) times daily.       . magnesium gluconate (MAGONATE) 500 MG tablet Take 500 mg by mouth 2 (two) times daily.        . pantoprazole (PROTONIX) 40 MG tablet Take 40 mg by mouth daily.       . metroNIDAZOLE (FLAGYL) 500 MG tablet Take 500 mg by mouth 2 (two) times daily. For 14 days         Results for orders placed during the hospital encounter of 06/01/11 (from the past 48 hour(s))  URINALYSIS, ROUTINE W REFLEX MICROSCOPIC     Status: Abnormal   Collection Time   06/01/11  8:38 AM      Component Value Range Comment   Color, Urine YELLOW  YELLOW     APPearance CLEAR  CLEAR     Specific Gravity, Urine 1.031 (*) 1.005 - 1.030     pH 5.5  5.0 - 8.0     Glucose, UA >1000 (*) NEGATIVE (mg/dL)    Hgb urine dipstick SMALL (*) NEGATIVE     Bilirubin Urine NEGATIVE  NEGATIVE     Ketones, ur >80 (*) NEGATIVE (mg/dL)    Protein, ur NEGATIVE  NEGATIVE (mg/dL)    Urobilinogen, UA 0.2  0.0 - 1.0 (mg/dL)    Nitrite NEGATIVE  NEGATIVE     Leukocytes, UA NEGATIVE  NEGATIVE    URINE MICROSCOPIC-ADD ON     Status: Normal   Collection Time   06/01/11  8:38 AM      Component Value Range Comment   Squamous Epithelial / LPF RARE  RARE     WBC, UA 0-2  <3 (WBC/hpf)    Bacteria, UA RARE  RARE    POCT PREGNANCY, URINE     Status: Normal   Collection Time   06/01/11  8:49 AM      Component Value Range Comment   Preg Test, Ur NEGATIVE     CBC     Status: Abnormal   Collection Time   06/01/11  9:00 AM      Component Value Range Comment   WBC 6.8  4.0 - 10.5 (K/uL)    RBC 5.79 (*) 3.87 - 5.11 (MIL/uL)  Hemoglobin 13.8  12.0 - 15.0 (g/dL)    HCT 16.1  09.6 - 04.5 (%)    MCV 74.6 (*) 78.0 - 100.0 (fL)    MCH 23.8 (*) 26.0 - 34.0 (pg)    MCHC 31.9  30.0 - 36.0 (g/dL)    RDW 40.9 (*) 81.1 - 15.5 (%)     Platelets 177  150 - 400 (K/uL)   DIFFERENTIAL     Status: Abnormal   Collection Time   06/01/11  9:00 AM      Component Value Range Comment   Neutrophils Relative 71  43 - 77 (%)    Neutro Abs 4.9  1.7 - 7.7 (K/uL)    Lymphocytes Relative 26  12 - 46 (%)    Lymphs Abs 1.8  0.7 - 4.0 (K/uL)    Monocytes Relative 2 (*) 3 - 12 (%)    Monocytes Absolute 0.1  0.1 - 1.0 (K/uL)    Eosinophils Relative 0  0 - 5 (%)    Eosinophils Absolute 0.0  0.0 - 0.7 (K/uL)    Basophils Relative 0  0 - 1 (%)    Basophils Absolute 0.0  0.0 - 0.1 (K/uL)   COMPREHENSIVE METABOLIC PANEL     Status: Abnormal   Collection Time   06/01/11  9:00 AM      Component Value Range Comment   Sodium 131 (*) 135 - 145 (mEq/L)    Potassium 5.2 (*) 3.5 - 5.1 (mEq/L) SLIGHT HEMOLYSIS   Chloride 90 (*) 96 - 112 (mEq/L)    CO2 12 (*) 19 - 32 (mEq/L)    Glucose, Bld 524 (*) 70 - 99 (mg/dL)    BUN 16  6 - 23 (mg/dL)    Creatinine, Ser 9.14 (*) 0.50 - 1.10 (mg/dL)    Calcium 78.2  8.4 - 10.5 (mg/dL)    Total Protein 8.7 (*) 6.0 - 8.3 (g/dL)    Albumin 4.2  3.5 - 5.2 (g/dL)    AST 92 (*) 0 - 37 (U/L) SLIGHT HEMOLYSIS   ALT 66 (*) 0 - 35 (U/L) SLIGHT HEMOLYSIS   Alkaline Phosphatase 118 (*) 39 - 117 (U/L) SLIGHT HEMOLYSIS   Total Bilirubin 0.4  0.3 - 1.2 (mg/dL)    GFR calc non Af Amer >90  >90 (mL/min)    GFR calc Af Amer >90  >90 (mL/min)   LIPASE, BLOOD     Status: Normal   Collection Time   06/01/11  9:00 AM      Component Value Range Comment   Lipase 26  11 - 59 (U/L)   GLUCOSE, CAPILLARY     Status: Abnormal   Collection Time   06/01/11 11:01 AM      Component Value Range Comment   Glucose-Capillary 439 (*) 70 - 99 (mg/dL)   GLUCOSE, CAPILLARY     Status: Abnormal   Collection Time   06/01/11 11:50 AM      Component Value Range Comment   Glucose-Capillary 352 (*) 70 - 99 (mg/dL)    No results found.  Review of Systems  Constitutional: Positive for chills. Negative for fever, weight loss, malaise/fatigue  and diaphoresis.  HENT: Negative for ear pain, congestion and tinnitus.   Eyes: Negative for blurred vision, double vision and photophobia.  Respiratory: Negative for cough, hemoptysis and stridor.   Cardiovascular: Negative for chest pain, palpitations, leg swelling and PND.  Gastrointestinal: Positive for nausea and vomiting. Negative for diarrhea, constipation, blood in stool and melena.  Genitourinary:  Negative.   Musculoskeletal: Negative.   Skin: Negative for itching and rash.  Neurological: Negative for weakness and headaches.  Endo/Heme/Allergies: Negative.   Psychiatric/Behavioral: Negative.     Blood pressure 137/88, pulse 89, temperature 97.7 F (36.5 C), temperature source Oral, resp. rate 18, last menstrual period 04/18/2011, SpO2 100.00%. Physical Exam  Nursing note and vitals reviewed. Constitutional: She is oriented to person, place, and time.       Asthenic frail AAF, disheveled, and somewhat Stange affect  HENT:  Head: Normocephalic and atraumatic.       No ict/pallor  Eyes: Conjunctivae and EOM are normal. Pupils are equal, round, and reactive to light.  Neck: Normal range of motion. Neck supple. No JVD present. No tracheal deviation present. No thyromegaly present.  Cardiovascular: Regular rhythm.        slightly tachycardic  Respiratory: Effort normal and breath sounds normal. She has no wheezes. She has no rales. She exhibits no tenderness.  Musculoskeletal: She exhibits no edema and no tenderness.  Lymphadenopathy:    She has no cervical adenopathy.  Neurological: She is alert and oriented to person, place, and time. She has normal reflexes. No cranial nerve deficit.  Psychiatric:       Odd affect     Assessment/Plan Patient Active Hospital Problem List: DKA (diabetic ketoacidoses) (04/27/2011)   Assessment:Anion gap of 29, Bicarb of 12 classifies her as Moderate to severe DKA NO blood gas to determine degreee of acidosis-get VBG Her Sugars are already  trending down with Rx-she will be implemented on glocummander and DKA protocol and admitted to ICU Stressed importance of insulin-contrasted this to her other habits and ability to support them-will ask In-patient DM coordinator to review this with her  Slightly hyperkalemic-has peaked t waves but not new from EKG 11.10.12  per my read, with non-specific ST t depression in V4-6-rpt EKG am  await rpt labs-will change fluids to D5 once glucose drops below 250 and reassess Has tachycardia likely mediated by signif volume depletion  DM (diabetes mellitus) (04/27/2011)   Assessment:see above  Hep C w/o coma, chronic (04/27/2011)   Assessment: Has never been seen or treated for this Thinks may have contracted this from a prior bf-will need outpt follow-up of this    Seizure disorder (04/28/2011)   Assessment: Continue lithium   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  Encouraged to maintain  Mental illness-Continue pscyhotropic meds  >1 hour Admit to SDU  [facility] 06/01/2011, 12:36 PM

## 2011-06-02 LAB — GLUCOSE, CAPILLARY
Glucose-Capillary: 107 mg/dL — ABNORMAL HIGH (ref 70–99)
Glucose-Capillary: 122 mg/dL — ABNORMAL HIGH (ref 70–99)
Glucose-Capillary: 146 mg/dL — ABNORMAL HIGH (ref 70–99)
Glucose-Capillary: 164 mg/dL — ABNORMAL HIGH (ref 70–99)
Glucose-Capillary: 185 mg/dL — ABNORMAL HIGH (ref 70–99)
Glucose-Capillary: 225 mg/dL — ABNORMAL HIGH (ref 70–99)
Glucose-Capillary: 74 mg/dL (ref 70–99)

## 2011-06-02 LAB — BASIC METABOLIC PANEL
BUN: 10 mg/dL (ref 6–23)
BUN: 10 mg/dL (ref 6–23)
BUN: 10 mg/dL (ref 6–23)
Calcium: 10.1 mg/dL (ref 8.4–10.5)
Calcium: 9.5 mg/dL (ref 8.4–10.5)
Chloride: 104 mEq/L (ref 96–112)
Creatinine, Ser: 0.46 mg/dL — ABNORMAL LOW (ref 0.50–1.10)
Creatinine, Ser: 0.49 mg/dL — ABNORMAL LOW (ref 0.50–1.10)
GFR calc Af Amer: >90 mL/min (ref >90)
GFR calc Af Amer: >90 mL/min (ref >90)
GFR calc Af Amer: >90 mL/min (ref >90)
GFR calc non Af Amer: >90 mL/min (ref >90)
GFR calc non Af Amer: >90 mL/min (ref >90)
GFR calc non Af Amer: >90 mL/min (ref >90)
Glucose, Bld: 81 mg/dL (ref 70–99)
Glucose, Bld: 138 mg/dL — ABNORMAL HIGH (ref 70–99)
Potassium: 3.3 mEq/L — ABNORMAL LOW (ref 3.5–5.1)
Sodium: 139 mEq/L (ref 135–145)

## 2011-06-02 MED ORDER — SODIUM CHLORIDE 0.9 % IJ SOLN
10.0000 mL | INTRAMUSCULAR | Status: DC | PRN
  Administered 2011-06-02: 10 mL

## 2011-06-02 MED ORDER — INSULIN ASPART PROT & ASPART (70-30 MIX) 100 UNIT/ML ~~LOC~~ SUSP
7.0000 [IU] | Freq: Two times a day (BID) | SUBCUTANEOUS | Status: DC
  Administered 2011-06-02 – 2011-06-03 (×3): 7 [IU] via SUBCUTANEOUS
  Filled 2011-06-02: qty 3

## 2011-06-02 MED ORDER — IBUPROFEN 600 MG PO TABS
600.0000 mg | ORAL_TABLET | Freq: Four times a day (QID) | ORAL | Status: DC
  Administered 2011-06-02 – 2011-06-05 (×12): 600 mg via ORAL
  Filled 2011-06-02 (×15): qty 1

## 2011-06-02 MED ORDER — INSULIN ASPART 100 UNIT/ML ~~LOC~~ SOLN
0.0000 [IU] | Freq: Three times a day (TID) | SUBCUTANEOUS | Status: DC
  Administered 2011-06-02: 2 [IU] via SUBCUTANEOUS
  Administered 2011-06-02 – 2011-06-03 (×2): 15 [IU] via SUBCUTANEOUS
  Filled 2011-06-02 (×2): qty 3

## 2011-06-02 MED ORDER — SODIUM CHLORIDE 0.9 % IJ SOLN
10.0000 mL | Freq: Two times a day (BID) | INTRAMUSCULAR | Status: DC
  Administered 2011-06-02 – 2011-06-05 (×7): 10 mL

## 2011-06-02 MED ORDER — INSULIN ASPART 100 UNIT/ML ~~LOC~~ SOLN: 0.0000 [IU] | Freq: Every day | SUBCUTANEOUS | Status: DC

## 2011-06-02 NOTE — Progress Notes (Signed)
[facility] HOSPITALIST progress note    Interval h/o:- 40 yo aaf admitted with DKA [Ag 29, HCO3 12, pH 7.28] 2/2 to non-compliance with meds.  Also Cirrhosis with Hep C (never Rx), Polysusbt h/o, Signif Mental illness, Seizure disorder  Glucose stabilizer started and Glucose better controlled d#2 of this admit Sigificant abd pain as well requiring IV Opiates  Subjective: Fast asleep.  Uneventful night.  States still has abd pain, but was deeply asleep on my eval initally.  Usually takes OTC meds for this.   Objective: Vital signs in last 24 hours: Temp:  [97.5 F (36.4 C)-97.8 F (36.6 C)] 97.8 F (36.6 C) (12/16 0000) Pulse Rate:  [66-89] 75  (12/16 0401) Resp:  [10-18] 11  (12/16 0401) BP: (104-142)/(66-88) 104/68 mmHg (12/16 0401) SpO2:  [98 %-100 %] 98 % (12/16 0401) Weight:  [46.8 kg (103 lb 2.8 oz)] 103 lb 2.8 oz (46.8 kg) (12/15 1700) Weight change:   Intake/Output Summary (Last 24 hours) at 06/02/11 0815 Last data filed at 06/02/11 0600  Gross per 24 hour  Intake 1801.8 ml  Output    800 ml  Net 1001.8 ml    BP 104/68  Pulse 75  Temp(Src) 97.8 F (36.6 C) (Oral)  Resp 11  Ht 5\' 4"  (1.626 m)  Wt 46.8 kg (103 lb 2.8 oz)  BMI 17.71 kg/m2  SpO2 98%  LMP 04/18/2011 Alert AAF Flat affect, poor dentition CTa B, no added sounds Abd soft, no pain on pressure with stethoscope, but then has pain on palpation.   BS good, no rebound,no guard S1 S2 no M/r/g-Tele NSR Neuro grossly intact moving all limbs x 4   Lab Results:  [facility] 06/02/11 0602 06/02/11 0220  NA 136 139  K 3.8 3.3*  CL 104 106  CO2 19 21  GLUCOSE 181* 81  BUN 10 10  CREATININE 0.46* 0.48*  CALCIUM 9.4 10.1  MG -- --  PHOS -- --    Basename 06/01/11 0900  AST 92*  ALT 66*  ALKPHOS 118*  BILITOT 0.4  PROT 8.7*  ALBUMIN 4.2    Basename 06/01/11 0900  LIPASE 26  AMYLASE --    Basename 06/01/11 0900  WBC 6.8  NEUTROABS 4.9  HGB 13.8  HCT 43.2  MCV 74.6*  PLT 177     Basename 06/01/11 1230  CKTOTAL 31  CKMB 1.7  CKMBINDEX --  TROPONINI <0.30   Micro Results: Recent Results (from the past 240 hour(s))  MRSA PCR SCREENING     Status: Normal   Collection Time   06/01/11  4:56 PM      Component Value Range Status Comment   MRSA by PCR NEGATIVE  NEGATIVE  Final     Medications: I have reviewed the patient's current medications. Scheduled Meds:   . sodium chloride   Intravenous Once  . sodium chloride   Intravenous STAT  . aspirin EC  81 mg Oral Daily  . heparin  5,000 Units Subcutaneous Q8H  . insulin aspart  10 Units Subcutaneous Once  . lithium carbonate  300 mg Oral TID  . morphine      . morphine      . morphine      .  morphine injection  4 mg Intramuscular Once  .  morphine injection  4 mg Intramuscular Once  . ondansetron  8 mg Oral Once  . potassium chloride  10 mEq Intravenous Q1H  . sodium chloride  1,000 mL Intravenous Once  . DISCONTD:  sodium chloride   Intravenous Once  . DISCONTD: insulin regular  10 Units Subcutaneous Once  . DISCONTD: insulin regular  10 Units Subcutaneous Once  . DISCONTD:  morphine injection  4 mg Intravenous Once  . DISCONTD: ondansetron (ZOFRAN) IV  4 mg Intravenous Once   Continuous Infusions:   . sodium chloride Stopped (06/01/11 1759)  . dextrose 5 % and 0.45% NaCl 125 mL/hr at 06/02/11 0600  . insulin (NOVOLIN-R) infusion 2.5 Units/hr (06/02/11 0600)  . DISCONTD: insulin (NOVOLIN-R) infusion 5.8 Units/hr (06/01/11 1625)   PRN Meds:.dextrose, morphine injection, ondansetron (ZOFRAN) IV, DISCONTD:  morphine injection   Assessment/Plan:  Patient Active Hospital Problem List: DKA (diabetic ketoacidoses) (04/27/2011) Assessment:Anion gap of 29, Bicarb of 12 classifies her as Moderate to severe DKA  ABG initally= mod acidosis   implemented on glu-commander-Last 4 blood sugars 74/117/167/185--Transition to 7 U 70/30, which she uses at home, and change to CBG QID/Ac/Hs for coverage Stressed  importance of insulin-contrasted this to her other habits and ability to support them-will ask In-patient DM coordinator to review this with her   hyperkalemia resolved-  tachycardia likely mediated by signif volume depletion currently reolsved   Abd pain-unclear cause-would avoid apap meds for now-Ibuprofen rx'd  DM (diabetes mellitus) (04/27/2011) Assessment:see above   Hep C w/o coma, chronic (04/27/2011) Assessment: Has never been seen or treated for this  Thinks may have contracted this from a prior bf-will need outpt follow-up of this   Seizure disorder (04/28/2011) Assessment: Continue lithium  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 Encouraged to maintain   Mental illness-Continue pscyhotropic meds-lithium    LOS: 1 day   Breydon Senters,JAI 06/02/2011, 8:15 AM

## 2011-06-03 LAB — KETONES, QUALITATIVE

## 2011-06-03 LAB — GLUCOSE, CAPILLARY
Glucose-Capillary: 178 mg/dL — ABNORMAL HIGH (ref 70–99)
Glucose-Capillary: 449 mg/dL — ABNORMAL HIGH (ref 70–99)
Glucose-Capillary: 349 mg/dL — ABNORMAL HIGH (ref 70–99)
Glucose-Capillary: 396 mg/dL — ABNORMAL HIGH (ref 70–99)

## 2011-06-03 MED ORDER — SODIUM CHLORIDE 0.9 % IV SOLN
INTRAVENOUS | Status: DC
  Administered 2011-06-03 (×2): via INTRAVENOUS

## 2011-06-03 MED ORDER — INSULIN ASPART 100 UNIT/ML ~~LOC~~ SOLN
0.0000 [IU] | Freq: Three times a day (TID) | SUBCUTANEOUS | Status: DC
  Administered 2011-06-03 (×3): 20 [IU] via SUBCUTANEOUS

## 2011-06-03 MED ORDER — INSULIN ASPART 100 UNIT/ML ~~LOC~~ SOLN
4.0000 [IU] | Freq: Three times a day (TID) | SUBCUTANEOUS | Status: DC
  Administered 2011-06-03 (×2): 4 [IU] via SUBCUTANEOUS

## 2011-06-03 MED ORDER — INSULIN ASPART PROT & ASPART (70-30 MIX) 100 UNIT/ML ~~LOC~~ SUSP
20.0000 [IU] | Freq: Two times a day (BID) | SUBCUTANEOUS | Status: DC
  Administered 2011-06-03: 20 [IU] via SUBCUTANEOUS

## 2011-06-03 MED ORDER — INSULIN ASPART 100 UNIT/ML ~~LOC~~ SOLN: 0.0000 [IU] | Freq: Every day | SUBCUTANEOUS | Status: DC

## 2011-06-03 NOTE — Progress Notes (Signed)
[facility] HOSPITALIST progress note    Interval h/o:- 40 yo aaf admitted with DKA [Ag 29, HCO3 12, pH 7.28] 2/2 to non-compliance with meds. Also Cirrhosis with Hep C (never Rx), Polysusbt h/o, Signif Mental illness, Seizure disorder  Glucose stabilizer started and Glucose better controlled d#2 of this admit  Sigificant abd pain-which is better controlled now  Subjective: Feels better.  Called by RN about FSBS in the 400's.  Other than occasional Abd pain, feels well    Objective: Vital signs in last 24 hours: Temp:  [97.7 F (36.5 C)-98.9 F (37.2 C)] 97.7 F (36.5 C) (12/17 0500) Pulse Rate:  [65-90] 65  (12/17 0500) Resp:  [9-20] 19  (12/17 0500) BP: (110-121)/(69-83) 121/83 mmHg (12/17 0500) SpO2:  [99 %-100 %] 99 % (12/17 0500) Weight change:   Intake/Output Summary (Last 24 hours) at 06/03/11 1122 Last data filed at 06/03/11 0500  Gross per 24 hour  Intake   1444 ml  Output      0 ml  Net   1444 ml    BP 121/83  Pulse 65  Temp(Src) 97.7 F (36.5 C) (Oral)  Resp 19  Ht 5\' 4"  (1.626 m)  Wt 46.8 kg (103 lb 2.8 oz)  BMI 17.71 kg/m2  SpO2 99%  LMP 04/18/2011 Alert AAF  Flat affect, poor dentition  CTa B, no added sounds  Abd soft, no pain on pressure BS good, no rebound, no guard  S1 S2 no M/r/g-Tele NSR  Neuro grossly intact moving all limbs x 4   Lab Results:  Basename 06/02/11 1000 06/02/11 0602  NA 138 136  K 3.5 3.8  CL 106 104  CO2 25 19  GLUCOSE 138* 181*  BUN 10 10  CREATININE 0.49* 0.46*  CALCIUM 9.5 9.4  MG -- --  PHOS -- --    Basename 06/01/11 0900  AST 92*  ALT 66*  ALKPHOS 118*  BILITOT 0.4  PROT 8.7*  ALBUMIN 4.2    Basename 06/01/11 0900  LIPASE 26  AMYLASE --    Basename 06/01/11 0900  WBC 6.8  NEUTROABS 4.9  HGB 13.8  HCT 43.2  MCV 74.6*  PLT 177    Basename 06/01/11 1230  CKTOTAL 31  CKMB 1.7  CKMBINDEX --  TROPONINI <0.30    Micro Results: Recent Results (from the past 240 hour(s))  MRSA PCR  SCREENING     Status: Normal   Collection Time   06/01/11  4:56 PM      Component Value Range Status Comment   MRSA by PCR NEGATIVE  NEGATIVE  Final    Medications: I have reviewed the patient's current medications. Scheduled Meds:   . aspirin EC  81 mg Oral Daily  . heparin  5,000 Units Subcutaneous Q8H  . ibuprofen  600 mg Oral QID  . insulin aspart  0-20 Units Subcutaneous TID WC  . insulin aspart  0-5 Units Subcutaneous QHS  . insulin aspart  4 Units Subcutaneous TID WC  . insulin aspart protamine-insulin aspart  20 Units Subcutaneous BID WC  . lithium carbonate  300 mg Oral TID  . sodium chloride  10 mL Intracatheter Q12H  . DISCONTD: insulin aspart  0-15 Units Subcutaneous TID WC  . DISCONTD: insulin aspart  0-5 Units Subcutaneous QHS  . DISCONTD: insulin aspart protamine-insulin aspart  7 Units Subcutaneous BID WC   Continuous Infusions:   . sodium chloride 100 mL/hr at 06/03/11 1044  . DISCONTD: dextrose 5 % and 0.45% NaCl 125  mL/hr at 06/03/11 0425  . DISCONTD: insulin (NOVOLIN-R) infusion Stopped (06/02/11 1120)   PRN Meds:.dextrose, ondansetron (ZOFRAN) IV, sodium chloride   Assessment/Plan: Patient Active Hospital Problem List: DKA (diabetic ketoacidoses) (04/27/2011)   Assessment: Labile blood sugar control-given 20 untis reg insulin, as well as SSI coverage-have sent for Serum Ketones as well as rpt BMET--Likely 2/2 to D5 being continued, despite Glucommander orders d/c'd. 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 Encouraged to maintain  Mental illness-Continue pscyhotropic meds-lithium    LOS: 2 days   [facility] 06/03/2011, 11:22 AM

## 2011-06-03 NOTE — Progress Notes (Signed)
Inpatient Diabetes Program Recommendations  AACE/ADA: New Consensus Statement on Inpatient Glycemic Control (2009)  Target Ranges:  Prepandial:   less than 140 mg/dL      Peak postprandial:   less than 180 mg/dL (1-2 hours)      Critically ill patients:  140 - 180 mg/dL   Reason for Visit: Patient with frequent admits with hyperglycemia.  Spoke to her briefly regarding affordability of insulin.  She states she did go to [employer]. eligibility appt. However she cannot afford the 10$ co-pay for meds or visits.  She seems very frustrated at this time.  Discussed with RN and MD.  Will follow.    Inpatient Diabetes Program Recommendations Insulin - Meal Coverage: Please discontinue Novolog Meal coverage since patient is on pre-mixed insulin 70/30 Novolog.

## 2011-06-04 LAB — BASIC METABOLIC PANEL WITH GFR
BUN: 10 mg/dL (ref 6–23)
CO2: 23 meq/L (ref 19–32)
Calcium: 8.7 mg/dL (ref 8.4–10.5)
Chloride: 105 meq/L (ref 96–112)
Creatinine, Ser: 0.41 mg/dL — ABNORMAL LOW (ref 0.50–1.10)
GFR calc Af Amer: >90 mL/min
GFR calc non Af Amer: >90 mL/min
Glucose, Bld: 194 mg/dL — ABNORMAL HIGH (ref 70–99)
Potassium: 3.4 meq/L — ABNORMAL LOW (ref 3.5–5.1)
Sodium: 133 meq/L — ABNORMAL LOW (ref 135–145)

## 2011-06-04 LAB — GLUCOSE, CAPILLARY: Glucose-Capillary: 284 mg/dL — ABNORMAL HIGH (ref 70–99)

## 2011-06-04 MED ORDER — VITAMINS A & D EX OINT
TOPICAL_OINTMENT | CUTANEOUS | Status: AC
  Administered 2011-06-04: 21:00:00
  Filled 2011-06-04: qty 5

## 2011-06-04 MED ORDER — INSULIN ASPART PROT & ASPART (70-30 MIX) 100 UNIT/ML ~~LOC~~ SUSP
24.0000 [IU] | Freq: Two times a day (BID) | SUBCUTANEOUS | Status: DC
  Administered 2011-06-04 – 2011-06-05 (×2): 24 [IU] via SUBCUTANEOUS

## 2011-06-04 MED ORDER — INSULIN ASPART PROT & ASPART (70-30 MIX) 100 UNIT/ML ~~LOC~~ SUSP
24.0000 [IU] | Freq: Once | SUBCUTANEOUS | Status: AC
  Administered 2011-06-04: 24 [IU] via SUBCUTANEOUS

## 2011-06-04 NOTE — Progress Notes (Addendum)
[id number] Davis,RN,BSN,CCM:  Tct-Pharmacy [facility] records checked patient is not eligible for the indigent funds program due to recent use.   Per the patient [facility] does [facility] Andrey Campanile in January 2013.

## 2011-06-04 NOTE — Progress Notes (Signed)
[facility] progress note    Interval h/o:- 40 yo aaf admitted with DKA [Ag 29, HCO3 12, pH 7.28] 2/2 to non-compliance with meds. Also Cirrhosis with Hep C (never Rx), Polysusbt h/o, Signif Mental illness, Seizure disorder  Glucose stabilizer started and Glucose better controlled d#2 of this admit, but then had some higher blood sugars because of D5 being continued Increased her Humulin 70/30 to 24 units 12.18, and if trends stabilize, likely d/c with good outpt follow-up Sigificant abd pain as well requiring IV Opiates  Subjective: Well. No new c/o. Taking PO without n/v.  Abd tolerable.   Objective: Vital signs in last 24 hours: Temp:  [97.8 F (36.6 C)-99.4 F (37.4 C)] 97.8 F (36.6 C) (12/18 0447) Pulse Rate:  [62-74] 62  (12/18 0447) Resp:  [14-18] 16  (12/18 0447) BP: (106-146)/(69-75) 146/75 mmHg (12/18 0447) SpO2:  [98 %-99 %] 99 % (12/18 0447) Weight change:   Intake/Output Summary (Last 24 hours) at 06/04/11 0825 Last data filed at 06/04/11 0437  Gross per 24 hour  Intake 2223.67 ml  Output      0 ml  Net 2223.67 ml    BP 146/75  Pulse 62  Temp(Src) 97.8 F (36.6 C) (Oral)  Resp 16  Ht 5\' 4"  (1.626 m)  Wt 46.8 kg (103 lb 2.8 oz)  BMI 17.71 kg/m2  SpO2 99%  LMP 04/18/2011 Alert AAF, laying comfortably in bed, poor dentition  CTa B, no added sounds  Abd soft, no pain on pressure with stethoscope, but then has pain on palpation. BS good, no rebound,no guard  S1 S2 no M/r/g-Tele NSR  Neuro grossly intact moving all limbs x 4  Lab Results:  Basename 06/04/11 0414 06/02/11 1000  NA 133* 138  K 3.4* 3.5  CL 105 106  CO2 23 25  GLUCOSE 194* 138*  BUN 10 10  CREATININE 0.41* 0.49*  CALCIUM 8.7 9.5  MG -- --  PHOS -- --    Basename 06/01/11 0900  AST 92*  ALT 66*  ALKPHOS 118*  BILITOT 0.4  PROT 8.7*  ALBUMIN 4.2    Basename 06/01/11 0900  LIPASE 26  AMYLASE --    Basename 06/01/11 0900  WBC 6.8  NEUTROABS 4.9  HGB 13.8    HCT 43.2  MCV 74.6*  PLT 177    Basename 06/01/11 1230  CKTOTAL 31  CKMB 1.7  CKMBINDEX --  TROPONINI <0.30   Micro Results: Recent Results (from the past 240 hour(s))  MRSA PCR SCREENING     Status: Normal   Collection Time   06/01/11  4:56 PM      Component Value Range Status Comment   MRSA by PCR NEGATIVE  NEGATIVE  Final     Medications: I have reviewed the patient's current medications. Scheduled Meds:   . aspirin EC  81 mg Oral Daily  . heparin  5,000 Units Subcutaneous Q8H  . ibuprofen  600 mg Oral QID  . insulin aspart  0-20 Units Subcutaneous TID WC  . insulin aspart  0-5 Units Subcutaneous QHS  . insulin aspart  4 Units Subcutaneous TID WC  . insulin aspart protamine-insulin aspart  20 Units Subcutaneous BID WC  . lithium carbonate  300 mg Oral TID  . sodium chloride  10 mL Intracatheter Q12H  . DISCONTD: insulin aspart  0-15 Units Subcutaneous TID WC  . DISCONTD: insulin aspart  0-5 Units Subcutaneous QHS  . DISCONTD: insulin aspart protamine-insulin aspart  7 Units Subcutaneous BID  WC   Continuous Infusions:   . sodium chloride 100 mL/hr at 06/03/11 2144  . DISCONTD: dextrose 5 % and 0.45% NaCl 125 mL/hr at 06/03/11 0425  . DISCONTD: insulin (NOVOLIN-R) infusion Stopped (06/02/11 1120)   PRN Meds:.dextrose, ondansetron (ZOFRAN) IV, sodium chloride   Assessment/Plan:  DKA (diabetic ketoacidoses) (04/27/2011) Assessment:Anion gap of 29, Bicarb of 12 classifies her as Moderate to severe DKA  ABG initally= mod acidosis  Blood sugars elevated over 24 hours, now back under better control Increased Humulin 70/30 to 24 units BID-reassess overall insulin needs, likely d/c home if mod controlled hyperkalemia resolved. tachycardia likely mediated volume depletion currently resolved   Abd pain-unclear cause-would avoid apap meds for now-Ibuprofen rx'd   DM (diabetes mellitus) (04/27/2011) Assessment:see above   Hep C w/o coma, chronic  (04/27/2011) Assessment: Has never been seen or treated for this  Thinks may have contracted this from a prior bf-will need outpt follow-up of this   Seizure disorder (04/28/2011) Assessment: Continue lithium  Cirrhosis of liver (04/28/2011) Assessment: see above-complicated by her chronic ETOH use,  LFT's reflect Hepatocellular injury  Needs screening US by PCP on follow-up   Dehydration (05/13/2011) Assessment: 2/2 to dka state  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 Encouraged to maintain  Mental illness-Continue pscyhotropic meds-lithium   Dispo- d/c 1-2 days on Humulin 70/30 if blood sugars better controlled   LOS: 3 days   Norleen Xie,JAI 06/04/2011, 8:25 AM

## 2011-06-05 DIAGNOSIS — Z91148 Patient's other noncompliance with medication regimen for other reason: Secondary | ICD-10-CM

## 2011-06-05 DIAGNOSIS — Z720 Tobacco use: Secondary | ICD-10-CM | POA: Diagnosis present

## 2011-06-05 DIAGNOSIS — Z9114 Patient's other noncompliance with medication regimen: Secondary | ICD-10-CM

## 2011-06-05 DIAGNOSIS — F319 Bipolar disorder, unspecified: Secondary | ICD-10-CM

## 2011-06-05 LAB — GLUCOSE, CAPILLARY
Glucose-Capillary: 208 mg/dL — ABNORMAL HIGH (ref 70–99)
Glucose-Capillary: 262 mg/dL — ABNORMAL HIGH (ref 70–99)

## 2011-06-05 MED ORDER — NICOTINE 10 MG IN INHA
1.0000 | RESPIRATORY_TRACT | Status: DC | PRN
  Filled 2011-06-05: qty 33

## 2011-06-05 MED ORDER — LITHIUM CARBONATE ER 300 MG PO TBCR: 300.0000 mg | EXTENDED_RELEASE_TABLET | Freq: Three times a day (TID) | ORAL | Status: DC

## 2011-06-05 MED ORDER — ALPRAZOLAM 0.5 MG PO TABS
0.5000 mg | ORAL_TABLET | Freq: Four times a day (QID) | ORAL | Status: DC | PRN
  Administered 2011-06-05: 0.5 mg via ORAL
  Filled 2011-06-05: qty 1

## 2011-06-05 MED ORDER — INSULIN ASPART 100 UNIT/ML ~~LOC~~ SOLN: SUBCUTANEOUS | Status: DC

## 2011-06-05 MED ORDER — INSULIN PEN STARTER KIT
1.0000 | Freq: Once | Status: AC
  Administered 2011-06-05: 1
  Filled 2011-06-05: qty 1

## 2011-06-05 MED ORDER — PANTOPRAZOLE SODIUM 40 MG PO TBEC: 40.0000 mg | DELAYED_RELEASE_TABLET | Freq: Every day | ORAL | Status: DC

## 2011-06-05 MED ORDER — INSULIN NPH ISOPHANE & REGULAR (70-30) 100 UNIT/ML ~~LOC~~ SUSP: 28.0000 [IU] | Freq: Two times a day (BID) | SUBCUTANEOUS | Status: DC

## 2011-06-05 MED ORDER — NICOTINE 10 MG IN INHA: 1.0000 | RESPIRATORY_TRACT | Status: AC | PRN

## 2011-06-05 MED ORDER — POTASSIUM CHLORIDE CRYS ER 20 MEQ PO TBCR
40.0000 meq | EXTENDED_RELEASE_TABLET | Freq: Once | ORAL | Status: AC
  Administered 2011-06-05: 40 meq via ORAL
  Filled 2011-06-05: qty 2

## 2011-06-05 NOTE — Discharge Summary (Addendum)
Physician Discharge Summary  Patient ID: Mary [name] MRN: [id number] DOB/AGE: June 05, 1971 40 y.o.  Admit date: 06/01/2011 Discharge date: 06/05/2011  Primary Care Physician:  Georganna Skeans, MD, MD   Discharge Diagnoses:    Present on Admission:  .DKA (diabetic ketoacidoses) .DM (diabetes mellitus) .Seizure disorder .Cirrhosis of liver .Dehydration .Tobacco abuse  Discharge Medications:  Current Discharge Medication List    START taking these medications   Details  nicotine (NICOTROL) 10 MG inhaler Inhale 1 puff into the lungs as needed for smoking cessation. Qty: 1 each, Refills: 6      CONTINUE these medications which have CHANGED   Details  insulin aspart (NOVOLOG) 100 UNIT/ML injection If Blood sugar 150-250, give yourself 2 units If blood sugar 251-350, give yourself 5 units of insulin If your blood sugar is above 351, take 7 units of insulin and call your MD Qty: 1 vial, Refills: 10    insulin NPH-insulin regular (NOVOLIN 70/30) (70-30) 100 UNIT/ML injection Inject 28 Units into the skin 2 (two) times daily with a meal. Qty: 10 mL, Refills: 12    lithium carbonate (LITHOBID) 300 MG CR tablet Take 1 tablet (300 mg total) by mouth 3 (three) times daily. Qty: 90 tablet, Refills: 6    pantoprazole (PROTONIX) 40 MG tablet Take 1 tablet (40 mg total) by mouth daily. Qty: 30 tablet, Refills: 10      CONTINUE these medications which have NOT CHANGED   Details  aspirin EC 81 MG tablet Take 81 mg by mouth daily.     magnesium gluconate (MAGONATE) 500 MG tablet Take 500 mg by mouth 2 (two) times daily.        STOP taking these medications     metroNIDAZOLE (FLAGYL) 500 MG tablet          Disposition and Follow-up: The patient is being discharged home. She is encouraged to followup with Dr. Andrey Campanile within 2 weeks.  Consults: None  Significant Diagnostic Studies:  No results found.  Discharge Laboratory Values: Basic Metabolic Panel:  Lab 06/04/11 4098  06/02/11 1000 06/02/11 0602 06/02/11 0220 06/01/11 2146  NA 133* 138 136 139 139  K 3.4* 3.5 -- -- --  CL 105 106 104 106 104  CO2 23 25 19 21  17*  GLUCOSE 194* 138* 181* 81 190*  BUN 10 10 10 10 11   CREATININE 0.41* 0.49* 0.46* 0.48* 0.46*  CALCIUM 8.7 9.5 9.4 10.1 9.7  MG -- -- -- -- --  PHOS -- -- -- -- --   GFR Estimated Creatinine Clearance: 68.4 ml/min (by C-G formula based on Cr of 0.41). Liver Function Tests:  Lab 06/01/11 0900  AST 92*  ALT 66*  ALKPHOS 118*  BILITOT 0.4  PROT 8.7*  ALBUMIN 4.2    Lab 06/01/11 0900  LIPASE 26  AMYLASE --    CBC:  Lab 06/01/11 0900  WBC 6.8  NEUTROABS 4.9  HGB 13.8  HCT 43.2  MCV 74.6*  PLT 177   Cardiac Enzymes:  Lab 06/01/11 1230  CKTOTAL 31  CKMB 1.7  CKMBINDEX --  TROPONINI <0.30   BNP: No components found with this basename: POCBNP:5 CBG:  Lab 06/05/11 1149 06/05/11 0757 06/04/11 2150 06/04/11 1719 06/04/11 1142  GLUCAP 115* 262* 239* 186* 208*   Microbiology Recent Results (from the past 240 hour(s))  MRSA PCR SCREENING     Status: Normal   Collection Time   06/01/11  4:56 PM      Component Value Range Status Comment  MRSA by PCR NEGATIVE  NEGATIVE  Final      Brief H and P: For complete details please refer to admission H and P, but in brief, Mary [name] is a 40 year old female well known to the hospitalist service secondary to multiple admissions for recurrent diabetic ketoacidosis in the setting of medical nonadherence. She presented to the hospital on 06/01/2011 with abdominal pain and nausea and was found to be in DKA.  Physical Exam at Discharge: BP 125/76  Pulse 61  Temp(Src) 98.3 F (36.8 C) (Oral)  Resp 16  Ht 5\' 4"  (1.626 m)  Wt 46.8 kg (103 lb 2.8 oz)  BMI 17.71 kg/m2  SpO2 97%  LMP 04/18/2011 Gen:  NAD Cardiovascular:  RRR, No M/R/G Respiratory: Lungs CTAB Gastrointestinal: Abdomen soft, NT/ND with normal active bowel sounds. Extremities: No C/E/C   Hospital Course:    Principal Problem:  *DKA (diabetic ketoacidoses) The patient was admitted to the hospital and put on a glucose stabilizer with insulin drip. Once she met parameters for discontinuation of the insulin drip, she was transitioned over to basal/bolus insulin with 70/30 mix. She has had recurrent hospitalizations for similar issues that stem from medical nonadherence. She is at high-risk for recurrent hospitalizations related to nonadherence. Active Problems:  DM (diabetes mellitus) The patient's hemoglobin A1c was checked on 05/15/2011 was 14.3. She's had marked elevation of her hemoglobin A1c over the past year and despite multiple interventions with teaching per the diabetes coordinator and attempts to obtain her medications, she continues to come in with recurrent DKA.  Non compliance w medication regimen Likely exacerbated by her history of polysubstance abuse and poor social circumstances. She is active with health service we have encouraged her to followup there for ongoing evaluation and treatment of her chronic medical problems including diabetes.  Cirrhosis of liver Patient has known cirrhosis of the liver but no active issues were identified during the course of her hospital stay.  Dehydration Patient's dehydration was secondary to DKA. She was vigorously hydrated and this point, her renal function is at baseline.  Hep C w/o coma, chronic No active issues during this hospital stay.  Seizure disorder No seizure events noted during this hospital stay. She is not on any antiseizure medicines at baseline.  Hypokalemia Secondary to osmotic diuresis and electrolyte shifts. Patient will be given 40 mEq of oral potassium replacement therapy prior to discharge today.  Tobacco abuse Patient was counseled and given a prescription for Nicotrol inhaler.  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 Kidney  function is stable.  Bipolar disorder Patient was maintained on lithium therapy.    Diet: Carbohydrate modified.  Activity:  No restrictions.  Condition at discharge: Improved.  Time spent on Discharge: 35 minutes.  Signed: Dr. Trula Ore Alesia Oshields Pager [phone] 06/05/2011, 12:01 PM   /

## 2011-06-05 NOTE — Progress Notes (Signed)
Spoke to patient regarding discharge plan.  She states that Dr. Darnelle Catalan told her that she could have insulin pen from hospitalization to get her through until she can buy insulin.  Explained that insulin pen is different from injecting insulin with syringe.  Asked bedside RN to educate patient on proper use of insulin pen.  Explained that pen will only get her through next 2 days and that she will need to get Rx. filled for insulin by Friday.  She has planned to ask a family friend for assistance with purchase of insulin.  She can purchase 70/30 from [facility] for 10$.  Told her that she needs to budget around 30$ per month for insulin and diabetes supply for proper self-care.

## 2011-06-12 ENCOUNTER — Inpatient Hospital Stay (HOSPITAL_COMMUNITY)
Admission: EM | Admit: 2011-06-12 | Discharge: 2011-06-14 | DRG: 638 | Disposition: A | Discharge status: Home / Self Care | Payer: Medicaid Other | Attending: Internal Medicine | Admitting: Internal Medicine

## 2011-06-12 ENCOUNTER — Encounter (HOSPITAL_COMMUNITY): Payer: Self-pay | Admitting: Internal Medicine

## 2011-06-12 DIAGNOSIS — E1310 Other specified diabetes mellitus with ketoacidosis without coma: Principal | ICD-10-CM | POA: Diagnosis present

## 2011-06-12 DIAGNOSIS — Z794 Long term (current) use of insulin: Secondary | ICD-10-CM | POA: Diagnosis not present

## 2011-06-12 DIAGNOSIS — F3189 Other bipolar disorder: Secondary | ICD-10-CM | POA: Diagnosis present

## 2011-06-12 DIAGNOSIS — Z9119 Patient's noncompliance with other medical treatment and regimen: Secondary | ICD-10-CM | POA: Diagnosis not present

## 2011-06-12 DIAGNOSIS — K7460 Unspecified cirrhosis of liver: Secondary | ICD-10-CM | POA: Diagnosis present

## 2011-06-12 DIAGNOSIS — B1920 Unspecified viral hepatitis C without hepatic coma: Secondary | ICD-10-CM | POA: Diagnosis present

## 2011-06-12 DIAGNOSIS — Z91199 Patient's noncompliance with other medical treatment and regimen due to unspecified reason: Secondary | ICD-10-CM

## 2011-06-12 DIAGNOSIS — F17200 Nicotine dependence, unspecified, uncomplicated: Secondary | ICD-10-CM | POA: Diagnosis present

## 2011-06-12 DIAGNOSIS — Z720 Tobacco use: Secondary | ICD-10-CM | POA: Diagnosis present

## 2011-06-12 DIAGNOSIS — F319 Bipolar disorder, unspecified: Secondary | ICD-10-CM | POA: Diagnosis present

## 2011-06-12 DIAGNOSIS — N183 Chronic kidney disease, stage 3 unspecified: Secondary | ICD-10-CM | POA: Diagnosis present

## 2011-06-12 DIAGNOSIS — G40909 Epilepsy, unspecified, not intractable, without status epilepticus: Secondary | ICD-10-CM | POA: Diagnosis present

## 2011-06-12 DIAGNOSIS — F4310 Post-traumatic stress disorder, unspecified: Secondary | ICD-10-CM | POA: Diagnosis present

## 2011-06-12 DIAGNOSIS — F1910 Other psychoactive substance abuse, uncomplicated: Secondary | ICD-10-CM | POA: Diagnosis present

## 2011-06-12 DIAGNOSIS — B182 Chronic viral hepatitis C: Secondary | ICD-10-CM | POA: Diagnosis present

## 2011-06-12 DIAGNOSIS — Z91148 Patient's other noncompliance with medication regimen for other reason: Secondary | ICD-10-CM

## 2011-06-12 DIAGNOSIS — Z9114 Patient's other noncompliance with medication regimen: Secondary | ICD-10-CM

## 2011-06-12 DIAGNOSIS — D72829 Elevated white blood cell count, unspecified: Secondary | ICD-10-CM | POA: Diagnosis present

## 2011-06-12 DIAGNOSIS — E1110 Type 2 diabetes mellitus with ketoacidosis without coma: Secondary | ICD-10-CM

## 2011-06-12 HISTORY — DX: Type 2 diabetes mellitus with ketoacidosis without coma: E11.10

## 2011-06-12 LAB — BLOOD GAS, VENOUS
Acid-base deficit: 11.1 mmol/L — ABNORMAL HIGH (ref 0.0–2.0)
Bicarbonate: 15.8 mEq/L — ABNORMAL LOW (ref 20.0–24.0)
O2 Content: 2 L/min
O2 Saturation: 81.1 %
TCO2: 14.6 mmol/L (ref 0–100)
pO2, Ven: 54.9 mmHg — ABNORMAL HIGH (ref 30.0–45.0)

## 2011-06-12 LAB — URINE MICROSCOPIC-ADD ON

## 2011-06-12 LAB — DIFFERENTIAL
Basophils Absolute: 0.1 10*3/uL (ref 0.0–0.1)
Basophils Relative: 0 % (ref 0–1)
Eosinophils Relative: 0 % (ref 0–5)
Lymphs Abs: 2.3 10*3/uL (ref 0.7–4.0)
Monocytes Relative: 2 % — ABNORMAL LOW (ref 3–12)
Neutrophils Relative %: 82 % — ABNORMAL HIGH (ref 43–77)

## 2011-06-12 LAB — CBC
HCT: 45.4 % (ref 36.0–46.0)
Hemoglobin: 14.4 g/dL (ref 12.0–15.0)
MCH: 24.1 pg — ABNORMAL LOW (ref 26.0–34.0)
MCHC: 31.7 g/dL (ref 30.0–36.0)
RBC: 5.97 MIL/uL — ABNORMAL HIGH (ref 3.87–5.11)

## 2011-06-12 LAB — URINALYSIS, ROUTINE W REFLEX MICROSCOPIC
Bilirubin Urine: NEGATIVE
Nitrite: NEGATIVE
Protein, ur: NEGATIVE mg/dL
Specific Gravity, Urine: 1.032 — ABNORMAL HIGH (ref 1.005–1.030)
Urobilinogen, UA: 0.2 mg/dL (ref 0.0–1.0)

## 2011-06-12 LAB — LIPASE, BLOOD: Lipase: 22 U/L (ref 11–59)

## 2011-06-12 LAB — COMPREHENSIVE METABOLIC PANEL
ALT: 91 U/L — ABNORMAL HIGH (ref 0–35)
AST: 93 U/L — ABNORMAL HIGH (ref 0–37)
Calcium: 11 mg/dL — ABNORMAL HIGH (ref 8.4–10.5)
Creatinine, Ser: 0.55 mg/dL (ref 0.50–1.10)
Sodium: 135 mEq/L (ref 135–145)
Total Protein: 8.9 g/dL — ABNORMAL HIGH (ref 6.0–8.3)

## 2011-06-12 LAB — POCT I-STAT, CHEM 8
BUN: 24 mg/dL — ABNORMAL HIGH (ref 6–23)
Chloride: 111 mEq/L (ref 96–112)
Creatinine, Ser: 0.7 mg/dL (ref 0.50–1.10)
Sodium: 141 mEq/L (ref 135–145)
TCO2: 16 mmol/L (ref 0–100)

## 2011-06-12 LAB — GLUCOSE, CAPILLARY: Glucose-Capillary: 395 mg/dL — ABNORMAL HIGH (ref 70–99)

## 2011-06-12 MED ORDER — DEXTROSE-NACL 5-0.45 % IV SOLN: INTRAVENOUS | Status: DC

## 2011-06-12 MED ORDER — HYDROMORPHONE HCL PF 1 MG/ML IJ SOLN
1.0000 mg | Freq: Once | INTRAMUSCULAR | Status: AC
  Administered 2011-06-12: 1 mg via INTRAVENOUS
  Filled 2011-06-12: qty 1

## 2011-06-12 MED ORDER — SODIUM CHLORIDE 0.9 % IV SOLN
INTRAVENOUS | Status: DC
  Filled 2011-06-12: qty 1

## 2011-06-12 MED ORDER — SODIUM CHLORIDE 0.9 % IV SOLN
999.0000 mL | Freq: Once | INTRAVENOUS | Status: AC
  Administered 2011-06-12: 1000 mL via INTRAVENOUS

## 2011-06-12 MED ORDER — POTASSIUM CHLORIDE 10 MEQ/100ML IV SOLN
10.0000 meq | INTRAVENOUS | Status: AC
  Administered 2011-06-13 (×2): 10 meq via INTRAVENOUS
  Filled 2011-06-12 (×2): qty 100

## 2011-06-12 MED ORDER — QUETIAPINE FUMARATE 50 MG PO TABS
50.0000 mg | ORAL_TABLET | Freq: Every day | ORAL | Status: DC
  Administered 2011-06-13: 50 mg via ORAL
  Filled 2011-06-12 (×2): qty 1

## 2011-06-12 MED ORDER — DEXTROSE-NACL 5-0.45 % IV SOLN
INTRAVENOUS | Status: DC
  Administered 2011-06-13 (×2): via INTRAVENOUS

## 2011-06-12 MED ORDER — GI COCKTAIL ~~LOC~~
30.0000 mL | Freq: Once | ORAL | Status: AC
  Administered 2011-06-12: 30 mL via ORAL
  Filled 2011-06-12: qty 30

## 2011-06-12 MED ORDER — ONDANSETRON HCL 4 MG/2ML IJ SOLN
4.0000 mg | Freq: Once | INTRAMUSCULAR | Status: AC
  Administered 2011-06-12: 4 mg via INTRAVENOUS
  Filled 2011-06-12: qty 2

## 2011-06-12 MED ORDER — ASPIRIN EC 81 MG PO TBEC
81.0000 mg | DELAYED_RELEASE_TABLET | Freq: Every day | ORAL | Status: DC
  Administered 2011-06-13 – 2011-06-14 (×2): 81 mg via ORAL
  Filled 2011-06-12 (×2): qty 1

## 2011-06-12 MED ORDER — LITHIUM CARBONATE 300 MG PO CAPS
600.0000 mg | ORAL_CAPSULE | Freq: Every day | ORAL | Status: DC
  Administered 2011-06-13: 600 mg via ORAL
  Filled 2011-06-12 (×2): qty 2

## 2011-06-12 MED ORDER — DEXTROSE 50 % IV SOLN: 25.0000 mL | INTRAVENOUS | Status: DC | PRN

## 2011-06-12 MED ORDER — HEPARIN SODIUM (PORCINE) 5000 UNIT/ML IJ SOLN
5000.0000 [IU] | Freq: Three times a day (TID) | INTRAMUSCULAR | Status: DC
  Administered 2011-06-13 – 2011-06-14 (×5): 5000 [IU] via SUBCUTANEOUS
  Filled 2011-06-12 (×10): qty 1

## 2011-06-12 MED ORDER — INSULIN REGULAR BOLUS VIA INFUSION
0.0000 [IU] | Freq: Three times a day (TID) | INTRAVENOUS | Status: DC
  Filled 2011-06-12 (×3): qty 10

## 2011-06-12 MED ORDER — SODIUM CHLORIDE 0.45 % IV SOLN
INTRAVENOUS | Status: DC
  Administered 2011-06-13 – 2011-06-14 (×4): via INTRAVENOUS

## 2011-06-12 MED ORDER — SODIUM CHLORIDE 0.9 % IV SOLN
INTRAVENOUS | Status: DC
  Administered 2011-06-12: 4.1 [IU]/h via INTRAVENOUS
  Filled 2011-06-12: qty 1

## 2011-06-12 MED ORDER — NICOTINE 10 MG IN INHA: 1.0000 | RESPIRATORY_TRACT | Status: DC | PRN

## 2011-06-12 MED ORDER — SODIUM CHLORIDE 0.9 % IV SOLN
INTRAVENOUS | Status: DC
  Administered 2011-06-12 (×2): via INTRAVENOUS

## 2011-06-12 MED ORDER — SODIUM CHLORIDE 0.9 % IV SOLN
INTRAVENOUS | Status: AC
  Administered 2011-06-12: via INTRAVENOUS

## 2011-06-12 NOTE — ED Notes (Signed)
Pt is lethargic, but easily arousable.  Her CBG was 541 just now.  She c/o abd pain when she arouses.

## 2011-06-12 NOTE — ED Notes (Signed)
Insulin infusing at 10.1 upon ICU

## 2011-06-12 NOTE — ED Notes (Signed)
AVW:[id number]<XB> Expected date:06/12/11<BR> Expected time: 5:30 PM<BR> Means of arrival:Ambulance<BR> Comments:<BR> EMS 10 GC, 40 yof hyperglycemia

## 2011-06-12 NOTE — H&P (Addendum)
PCP:   Georganna Skeans, MD, MD  -- Pt states she has first appt with her 1/14  Chief Complaint:  Abd pain, vomiting  HPI: 40yoF with h/o diabetes and recurrent admissions for DKA, HepC cirrhosis, seizure  disorder, CKD stage III presents with DKA  Per last d/c summary, she is well known to [facility] for multiple admissions for  DKA in the setting of medication non-adherence, polysubstance abuse, poor social  situation. She was discharged just 6 days ago for DKA.   She states that on discharged she was given insulin from the hospital to see her through until  she could get insulin via alternate means, however she ran out this morning, doesn't work so  has no money to get insulin on her own, and wasn't able get to get insulin otherwise. So, her  abdomen started hurting and she started vomiting, for which she came to the ED. She can state  that she is supposed to take 28u of 70/30 Novolin BID but states she didn't have the SSI. She  repeatedly endorses compliance with the insulin BID up until she ran out.   In the ED, vitals were stable. VBG was 7.22 / 40 / 55 / 16. Chem panel with normal Na, K  6.0 -> 5.0, Cl 93, HCO3 11, renal 24/0.55, glucose 528 -> 542 -> 471 -> 455. AlkP 129,  AST/ALT elevated at 93/91, Tbili 0.3. WBC count up at 14.5 with 82% neutros. Hct/plts  normal. UA with glucose and ketones, spec grav. 1.032, no infxn.   Endorses some chills but no frank fevers, rigors; having GERD and abdominal pain, and has felt  dizzy and dehydrated, and back pain. Denies LOC, SOB, dysuria, diarrhea. ROS o/w unremarkable.   Past Medical History  Diagnosis Date  . Diabetes mellitus     diagnosed in 1996-always been on insulin  . Hep C w/o coma, chronic     biopsy in 2010-no rx-was supposed to see a hepatologist in [place]  . Kidney stone   . Seizure disorder     started with pregnancy of first son  . Gastritis   . Bipolar 2 disorder   . Depression   . Post traumatic  stress disorder     "flipping out" after people close to her died  . DKA (diabetic ketoacidoses)     Recurrent admissions for DKA, medication non-complaince, poor social situation  . Cirrhosis     Past Surgical History  Procedure Date  . Ankle surgery   . Cesarean section    Medications:  HOME MEDS:  Reconciled with pt -- added Seroquel. States taking 600 HS Lithium not 300 TID.  Prior to Admission medications   Medication Sig Start Date End Date Taking? Authorizing Provider  aspirin EC 81 MG tablet Take 81 mg by mouth daily.    Yes Historical Provider, MD  lithium 300 MG tablet Take 600 mg by mouth at bedtime.     Yes Historical Provider, MD  magnesium gluconate (MAGONATE) 500 MG tablet Take 500 mg by mouth 2 (two) times daily.     Yes Historical Provider, MD  QUEtiapine (SEROQUEL) 50 MG tablet Take 50 mg by mouth at bedtime.     Yes Historical Provider, MD  insulin aspart (NOVOLOG) 100 UNIT/ML injection If Blood sugar 150-250, give yourself 2 units If blood sugar 251-350, give yourself 5 units of insulin If your blood sugar is above 351, take 7 units of insulin and call your MD 06/05/11   Hillery Aldo  insulin NPH-insulin regular (NOVOLIN 70/30) (70-30) 100 UNIT/ML injection Inject 28 Units into the skin 2 (two) times daily with a meal. 06/05/11 06/04/12  Christina Rama  nicotine (NICOTROL) 10 MG inhaler Inhale 1 puff into the lungs as needed for smoking cessation. 06/05/11 07/05/11  Christina Rama  pantoprazole (PROTONIX) 40 MG tablet Take 1 tablet (40 mg total) by mouth daily. 06/05/11   Christina Rama    Allergies:  Allergies  Allergen Reactions  . Penicillins Rash    Social History:   reports that she has been smoking.  She has never used smokeless tobacco. She reports that she does not drink alcohol or use illicit drugs. Relapsed last year on Crack cocaine and ETOH-went away to rehab. Came back to [facility] from [facility]-was in rehab for 3 mo. Then went [facility]  [facility] Currently 05/2011 endorses not doing any drugs except very occasional marijuana when she's nauseous.  Lives in [place] with BF, has grown children, 2 sons. Ambulatory.  Family History: Family History  Problem Relation Age of Onset  . Diabetes Mother     currently 61  . Fibromyalgia Mother   . Cirrhosis Father     died in 10-Nov-1997    Physical Exam: Filed Vitals:   06/12/11 2200 06/12/11 2215 06/12/11 2230 06/12/11 2245  BP: 133/78 133/82 138/111 153/85  Pulse: 89 80 87   Resp: 14 19 18 14   SpO2: 100% 100% 100%    Blood pressure 153/85, pulse 87, resp. rate 14, SpO2 100.00%.  Gen: Thin, young, fatigued appearing F in no distress. Voice initially soft and hoarse but  perks up a bit. Can relate her history, appears moderately comfortable.  HEENT: PERRL ~3-64mm, EOMI, sclera/irises/conjunctivae unremarkable. Mouth and tongue fairly  moist Neck: Thin and normal appearing, no palpable thyromegaly, external jugular minimally distended  1-2cm above clavicle, without Kussmaul sign Lungs: CTAB no w/c/r, normal exam Heart: Regular S1/2 and normal overall, no murmurs Abd: Soft, scaphoid, non distended, not rigid, but with diffuse facial grimacing to palpation Extrem: Quite thin and decreased bulk, hands and distal legs are quite cool but not frankly  cold. No BLE edema noted or hyperpigmented changes.  Neuro: Alert, conversant, attentive. CN2-12 intact, moves BUE's spontaneously. Grossly non- focal   Labs & Imaging Results for orders placed during the hospital encounter of 06/12/11 (from the past 48 hour(s))  GLUCOSE, CAPILLARY     Status: Abnormal   Collection Time   06/12/11  6:08 PM      Component Value Range Comment   Glucose-Capillary 541 (*) 70 - 99 (mg/dL)   URINALYSIS, ROUTINE W REFLEX MICROSCOPIC     Status: Abnormal   Collection Time   06/12/11  6:59 PM      Component Value Range Comment   Color, Urine YELLOW  YELLOW     APPearance CLEAR   CLEAR     Specific Gravity, Urine 1.032 (*) 1.005 - 1.030     pH 5.0  5.0 - 8.0     Glucose, UA >1000 (*) NEGATIVE (mg/dL)    Hgb urine dipstick NEGATIVE  NEGATIVE     Bilirubin Urine NEGATIVE  NEGATIVE     Ketones, ur >80 (*) NEGATIVE (mg/dL)    Protein, ur NEGATIVE  NEGATIVE (mg/dL)    Urobilinogen, UA 0.2  0.0 - 1.0 (mg/dL)    Nitrite NEGATIVE  NEGATIVE     Leukocytes, UA NEGATIVE  NEGATIVE    PREGNANCY, URINE     Status: Normal  Collection Time   06/12/11  6:59 PM      Component Value Range Comment   Preg Test, Ur NEGATIVE     URINE MICROSCOPIC-ADD ON     Status: Abnormal   Collection Time   06/12/11  6:59 PM      Component Value Range Comment   Squamous Epithelial / LPF FEW (*) RARE     WBC, UA 0-2  <3 (WBC/hpf)    RBC / HPF 0-2  <3 (RBC/hpf)    Bacteria, UA RARE  RARE     Urine-Other RARE YEAST     CBC     Status: Abnormal   Collection Time   06/12/11  7:10 PM      Component Value Range Comment   WBC 14.5 (*) 4.0 - 10.5 (K/uL)    RBC 5.97 (*) 3.87 - 5.11 (MIL/uL)    Hemoglobin 14.4  12.0 - 15.0 (g/dL)    HCT 16.1  09.6 - 04.5 (%)    MCV 76.0 (*) 78.0 - 100.0 (fL)    MCH 24.1 (*) 26.0 - 34.0 (pg)    MCHC 31.7  30.0 - 36.0 (g/dL)    RDW 40.9 (*) 81.1 - 15.5 (%)    Platelets 223  150 - 400 (K/uL)   DIFFERENTIAL     Status: Abnormal   Collection Time   06/12/11  7:10 PM      Component Value Range Comment   Neutrophils Relative 82 (*) 43 - 77 (%)    Neutro Abs 11.9 (*) 1.7 - 7.7 (K/uL)    Lymphocytes Relative 16  12 - 46 (%)    Lymphs Abs 2.3  0.7 - 4.0 (K/uL)    Monocytes Relative 2 (*) 3 - 12 (%)    Monocytes Absolute 0.3  0.1 - 1.0 (K/uL)    Eosinophils Relative 0  0 - 5 (%)    Eosinophils Absolute 0.0  0.0 - 0.7 (K/uL)    Basophils Relative 0  0 - 1 (%)    Basophils Absolute 0.1  0.0 - 0.1 (K/uL)   LIPASE, BLOOD     Status: Normal   Collection Time   06/12/11  7:10 PM      Component Value Range Comment   Lipase 22  11 - 59 (U/L)   COMPREHENSIVE METABOLIC  PANEL     Status: Abnormal   Collection Time   06/12/11  7:43 PM      Component Value Range Comment   Sodium 135  135 - 145 (mEq/L)    Potassium 6.0 (*) 3.5 - 5.1 (mEq/L)    Chloride 93 (*) 96 - 112 (mEq/L)    CO2 11 (*) 19 - 32 (mEq/L)    Glucose, Bld 528 (*) 70 - 99 (mg/dL)    BUN 24 (*) 6 - 23 (mg/dL)    Creatinine, Ser 9.14  0.50 - 1.10 (mg/dL)    Calcium 78.2 (*) 8.4 - 10.5 (mg/dL)    Total Protein 8.9 (*) 6.0 - 8.3 (g/dL)    Albumin 4.3  3.5 - 5.2 (g/dL)    AST 93 (*) 0 - 37 (U/L) SLIGHT HEMOLYSIS   ALT 91 (*) 0 - 35 (U/L)    Alkaline Phosphatase 129 (*) 39 - 117 (U/L)    Total Bilirubin 0.3  0.3 - 1.2 (mg/dL)    GFR calc non Af Amer >90  >90 (mL/min)    GFR calc Af Amer >90  >90 (mL/min)   BLOOD GAS, VENOUS  Status: Abnormal   Collection Time   06/12/11  7:55 PM      Component Value Range Comment   O2 Content 2.0      Delivery systems NASAL CANNULA      pH, Ven 7.221 (*) 7.250 - 7.300     pCO2, Ven 39.9 (*) 45.0 - 50.0 (mmHg)    pO2, Ven 54.9 (*) 30.0 - 45.0 (mmHg)    Bicarbonate 15.8 (*) 20.0 - 24.0 (mEq/L)    TCO2 14.6  0 - 100 (mmol/L)    Acid-base deficit 11.1 (*) 0.0 - 2.0 (mmol/L)    O2 Saturation 81.1      Patient temperature 37.0      Collection site VEIN      Drawn by COLLECTED BY LABORATORY      Sample type VEIN     POCT I-STAT, CHEM 8     Status: Abnormal   Collection Time   06/12/11  8:27 PM      Component Value Range Comment   Sodium 141  135 - 145 (mEq/L)    Potassium 5.0  3.5 - 5.1 (mEq/L)    Chloride 111  96 - 112 (mEq/L)    BUN 24 (*) 6 - 23 (mg/dL)    Creatinine, Ser 4.09  0.50 - 1.10 (mg/dL)    Glucose, Bld 811 (*) 70 - 99 (mg/dL)    Calcium, Ion 9.14  1.12 - 1.32 (mmol/L)    TCO2 16  0 - 100 (mmol/L)    Hemoglobin 16.3 (*) 12.0 - 15.0 (g/dL)    HCT 78.2 (*) 95.6 - 46.0 (%)   GLUCOSE, CAPILLARY     Status: Abnormal   Collection Time   06/12/11  8:28 PM      Component Value Range Comment   Glucose-Capillary 471 (*) 70 - 99 (mg/dL)     Comment 1 Notify RN     GLUCOSE, CAPILLARY     Status: Abnormal   Collection Time   06/12/11  9:28 PM      Component Value Range Comment   Glucose-Capillary 455 (*) 70 - 99 (mg/dL)   GLUCOSE, CAPILLARY     Status: Abnormal   Collection Time   06/12/11 10:32 PM      Component Value Range Comment   Glucose-Capillary 395 (*) 70 - 99 (mg/dL)    Comment 1 Notify RN      No results found.  Impression Present on Admission:  .DKA (diabetic ketoacidoses) .Cirrhosis of liver .CKD (chronic kidney disease) stage 3, GFR 30-59 ml/min .Seizure disorder .Hep C w/o coma, chronic  40yoF with h/o diabetes and recurrent admissions for DKA, HepC cirrhosis, seizure  disorder, CKD stage III presents with DKA, leukocytosis  1. DKA: Pt was discharged 6d ago for the same, with medication access/compliance issues as the  etiology. She endorses compliance with insulin that she was given, she just ran out.  Symptomatology all likely due to DKA, and therefore tend to doubt any other underlying etiology  at this time.   - Glucose stabilizer, IVF's, transition to home insulin regimen (Novolin 70/30 BID and  Novolin R with meals).   - Utox, SW consult  - Continue home ASA 81  2. Leukocytosis: No infectious ROS, UA is negative, no reported or documented fevers, no cough  or SOB. She has had leukocytosis before during admissions, presumably also for DKA. Monitor for now  3. H/o CKD, stage III: Cr currently 0.70, above baseline of 0.5, will give IVF's and monitor  BMET's, likely pre-renal.     4. H/o HepC, cirrhosis, prior alcohol abuse: Transaminases are minimally elevated compared to  baseline through this year, but rest of LFT's and synthetic fxn preserved. Not active issue.  5. H/o Bipolar disorder: Stable, continue home lithium, seroquel  6. H/o tobacco abuse: continue home nicotine inhaler    SDU, [facility] team 3 Full code, discussed with pt   Other plans as per orders.  Mary [name] 06/12/2011,  11:14 PM   103am Addendum: IV access and lab draws an issue already -- will order PICC

## 2011-06-12 NOTE — ED Notes (Signed)
Per EMS pt states that she took her insulin today, has been abdominal pain N/V today, CBG 525

## 2011-06-13 LAB — GLUCOSE, CAPILLARY
Glucose-Capillary: 169 mg/dL — ABNORMAL HIGH (ref 70–99)
Glucose-Capillary: 128 mg/dL — ABNORMAL HIGH (ref 70–99)
Glucose-Capillary: 116 mg/dL — ABNORMAL HIGH (ref 70–99)
Glucose-Capillary: 161 mg/dL — ABNORMAL HIGH (ref 70–99)
Glucose-Capillary: 332 mg/dL — ABNORMAL HIGH (ref 70–99)

## 2011-06-13 LAB — CBC
HCT: 31.8 % — ABNORMAL LOW (ref 36.0–46.0)
HCT: 32.3 % — ABNORMAL LOW (ref 36.0–46.0)
Hemoglobin: 10.3 g/dL — ABNORMAL LOW (ref 12.0–15.0)
MCH: 23.3 pg — ABNORMAL LOW (ref 26.0–34.0)
MCH: 24.1 pg — ABNORMAL LOW (ref 26.0–34.0)
MCHC: 31.1 g/dL (ref 30.0–36.0)
MCHC: 31.9 g/dL (ref 30.0–36.0)
MCV: 74.8 fL — ABNORMAL LOW (ref 78.0–100.0)
MCV: 75.6 fL — ABNORMAL LOW (ref 78.0–100.0)
Platelets: 208 10*3/uL (ref 150–400)
RDW: 15.9 % — ABNORMAL HIGH (ref 11.5–15.5)
RDW: 16.3 % — ABNORMAL HIGH (ref 11.5–15.5)
WBC: 16.9 10*3/uL — ABNORMAL HIGH (ref 4.0–10.5)

## 2011-06-13 LAB — BASIC METABOLIC PANEL
BUN: 16 mg/dL (ref 6–23)
BUN: 14 mg/dL (ref 6–23)
CO2: 23 mEq/L (ref 19–32)
Calcium: 8.9 mg/dL (ref 8.4–10.5)
Chloride: 107 mEq/L (ref 96–112)
Chloride: 107 mEq/L (ref 96–112)
Creatinine, Ser: 0.45 mg/dL — ABNORMAL LOW (ref 0.50–1.10)
Creatinine, Ser: 0.45 mg/dL — ABNORMAL LOW (ref 0.50–1.10)
GFR calc Af Amer: >90 mL/min (ref >90)
Glucose, Bld: 147 mg/dL — ABNORMAL HIGH (ref 70–99)
Potassium: 3.7 mEq/L (ref 3.5–5.1)

## 2011-06-13 LAB — RAPID URINE DRUG SCREEN, HOSP PERFORMED
Amphetamines: NOT DETECTED
Barbiturates: POSITIVE — AB
Benzodiazepines: NOT DETECTED
Cocaine: NOT DETECTED
Opiates: NOT DETECTED
Tetrahydrocannabinol: POSITIVE — AB

## 2011-06-13 MED ORDER — INSULIN ASPART 100 UNIT/ML ~~LOC~~ SOLN
0.0000 [IU] | Freq: Every day | SUBCUTANEOUS | Status: DC
  Administered 2011-06-13: 4 [IU] via SUBCUTANEOUS
  Filled 2011-06-13: qty 3

## 2011-06-13 MED ORDER — INSULIN ASPART PROT & ASPART (70-30 MIX) 100 UNIT/ML ~~LOC~~ SUSP
28.0000 [IU] | Freq: Two times a day (BID) | SUBCUTANEOUS | Status: DC
  Administered 2011-06-13 – 2011-06-14 (×2): 28 [IU] via SUBCUTANEOUS
  Filled 2011-06-13 (×2): qty 3

## 2011-06-13 MED ORDER — INSULIN ASPART 100 UNIT/ML ~~LOC~~ SOLN: 5.0000 [IU] | Freq: Once | SUBCUTANEOUS | Status: DC

## 2011-06-13 MED ORDER — SODIUM CHLORIDE 0.45 % IV BOLUS
500.0000 mL | Freq: Once | INTRAVENOUS | Status: AC
  Administered 2011-06-13: 500 mL via INTRAVENOUS

## 2011-06-13 MED ORDER — INSULIN REGULAR HUMAN 100 UNIT/ML IJ SOLN
5.0000 [IU] | Freq: Once | INTRAMUSCULAR | Status: DC
  Filled 2011-06-13: qty 0.05

## 2011-06-13 MED ORDER — INSULIN ASPART 100 UNIT/ML ~~LOC~~ SOLN
0.0000 [IU] | Freq: Three times a day (TID) | SUBCUTANEOUS | Status: DC
  Administered 2011-06-13: 2 [IU] via SUBCUTANEOUS
  Administered 2011-06-14 (×2): 3 [IU] via SUBCUTANEOUS
  Filled 2011-06-13: qty 3

## 2011-06-13 MED ORDER — INSULIN GLARGINE 100 UNIT/ML ~~LOC~~ SOLN
40.0000 [IU] | SUBCUTANEOUS | Status: AC
  Administered 2011-06-13: 40 [IU] via SUBCUTANEOUS
  Filled 2011-06-13: qty 3

## 2011-06-13 NOTE — Progress Notes (Signed)
UR completed 

## 2011-06-13 NOTE — Progress Notes (Signed)
Inpatient Diabetes Program Recommendations  AACE/ADA: New Consensus Statement on Inpatient Glycemic Control (2009)  Target Ranges:  Prepandial:   less than 140 mg/dL      Peak postprandial:   less than 180 mg/dL (1-2 hours)      Critically ill patients:  140 - 180 mg/dL   Reason for Visit: Hyperglycemia  Inpatient Diabetes Program Recommendations Insulin - IV drip/GlucoStabilizer: per DKA order protocol Insulin - Basal: 70/30 14 units bid and titrate up to home dose of 28 units bid Insulin - Meal Coverage: none with 70/30 insulin  Note: On GS - per DKA orders. Recommend 70/30 insulin when transitioning gtt to SQ. Will need social work consult for obtaining meds at d/c.

## 2011-06-13 NOTE — ED Provider Notes (Signed)
History     CSN: [id number]  Arrival date & time 06/12/11  1733   First MD Initiated Contact with Patient 06/12/11 1744      Chief Complaint  Patient presents with  . Abdominal Pain    generalized  . Hyperglycemia    (Consider location/radiation/quality/duration/timing/severity/associated sxs/prior treatment) HPI This patient presents with increasing abdominal pain and fatigue. The patient is initially somnolent, but awakens easily, gives an unclear recounting of when her pain started, though it seems to have begun several days ago, the patient describes a sharp, incapacitating, with significant nausea, vomiting, diarrhea, fevers, chills, dyspnea. Patient denies any relief with anything. The patient's reliability in terms history of present illness is questionable, level V caveat Past Medical History  Diagnosis Date  . Diabetes mellitus     diagnosed in 1996-always been on insulin  . Hep C w/o coma, chronic     biopsy in 2010-no rx-was supposed to see a hepatologist in [place]  . Kidney stone   . Seizure disorder     started with pregnancy of first son  . Gastritis   . Bipolar 2 disorder   . Depression   . Post traumatic stress disorder     "flipping out" after people close to her died  . DKA (diabetic ketoacidoses)     Recurrent admissions for DKA, medication non-complaince, poor social situation  . Cirrhosis     Past Surgical History  Procedure Date  . Ankle surgery   . Cesarean section     Family History  Problem Relation Age of Onset  . Diabetes Mother     currently 16  . Fibromyalgia Mother   . Cirrhosis Father     died in Nov 05, 1997    History  Substance Use Topics  . Smoking status: Current Everyday Smoker -- 0.2 packs/day for 25 years  . Smokeless tobacco: Never Used  . Alcohol Use: No     Endorses hasn't been drinking    OB History    Grav Para Term Preterm Abortions TAB SAB Ect Mult Living                  Review of Systems  Unable to  perform ROS: Other    Allergies  Penicillins  Home Medications  No current outpatient prescriptions on file.  BP 110/68  Pulse 73  Temp(Src) 97.7 F (36.5 C) (Oral)  Resp 14  Ht 5\' 4"  (1.626 m)  Wt 105 lb 9.6 oz (47.9 kg)  BMI 18.13 kg/m2  SpO2 97%  Physical Exam  Constitutional: No distress.       Patient yelling loudly, screeching about her abdominal pain  HENT:  Head: Normocephalic and atraumatic.  Eyes: Conjunctivae and EOM are normal.  Cardiovascular: Normal rate and regular rhythm.   Pulmonary/Chest: Effort normal. No stridor.  Abdominal: There is generalized tenderness. There is guarding.  Neurological: She is alert. No cranial nerve deficit. Coordination normal.  Skin: Skin is warm. She is diaphoretic.  Psychiatric: Her mood appears anxious. Her affect is labile. She is agitated. She expresses impulsivity.    ED Course  Procedures (including critical care time)  Labs Reviewed  CBC - Abnormal; Notable for the following:    WBC 14.5 (*)    RBC 5.97 (*)    MCV 76.0 (*)    MCH 24.1 (*)    RDW 16.1 (*)    All other components within normal limits  DIFFERENTIAL - Abnormal; Notable for the following:    Neutrophils Relative  82 (*)    Neutro Abs 11.9 (*)    Monocytes Relative 2 (*)    All other components within normal limits  URINALYSIS, ROUTINE W REFLEX MICROSCOPIC - Abnormal; Notable for the following:    Specific Gravity, Urine 1.032 (*)    Glucose, UA >1000 (*)    Ketones, ur >80 (*)    All other components within normal limits  BLOOD GAS, VENOUS - Abnormal; Notable for the following:    pH, Ven 7.221 (*)    pCO2, Ven 39.9 (*)    pO2, Ven 54.9 (*)    Bicarbonate 15.8 (*)    Acid-base deficit 11.1 (*)    All other components within normal limits  GLUCOSE, CAPILLARY - Abnormal; Notable for the following:    Glucose-Capillary 541 (*)    All other components within normal limits  URINE MICROSCOPIC-ADD ON - Abnormal; Notable for the following:     Squamous Epithelial / LPF FEW (*)    All other components within normal limits  COMPREHENSIVE METABOLIC PANEL - Abnormal; Notable for the following:    Potassium 6.0 (*)    Chloride 93 (*)    CO2 11 (*)    Glucose, Bld 528 (*)    BUN 24 (*)    Calcium 11.0 (*)    Total Protein 8.9 (*)    AST 93 (*) SLIGHT HEMOLYSIS   ALT 91 (*)    Alkaline Phosphatase 129 (*)    All other components within normal limits  POCT I-STAT, CHEM 8 - Abnormal; Notable for the following:    BUN 24 (*)    Glucose, Bld 542 (*)    Hemoglobin 16.3 (*)    HCT 48.0 (*)    All other components within normal limits  GLUCOSE, CAPILLARY - Abnormal; Notable for the following:    Glucose-Capillary 471 (*)    All other components within normal limits  GLUCOSE, CAPILLARY - Abnormal; Notable for the following:    Glucose-Capillary 455 (*)    All other components within normal limits  GLUCOSE, CAPILLARY - Abnormal; Notable for the following:    Glucose-Capillary 395 (*)    All other components within normal limits  LIPASE, BLOOD  PREGNANCY, URINE  MRSA PCR SCREENING  BASIC METABOLIC PANEL  BASIC METABOLIC PANEL  CBC  POCT CBG MONITORING  POCT CBG MONITORING  URINE RAPID DRUG SCREEN (HOSP PERFORMED)  BASIC METABOLIC PANEL  BASIC METABOLIC PANEL   No results found.   No diagnosis found.   Pulse ox 97% on 2 L, abnormal Cardiac monitor 85, sinus rhythm, normal  MDM  This 40 year old female, who is known to this facility will, now presents with days of abdominal pain, on initial exam is uncomfortable appearing, yelling loudly regarding her pain. The patient's AMS precluded some aspects of ROS, HPI and was concerning for acute infectious etiology, though the patient's subsequent eval was more c/w DKA.   The patient was started on glucose stabilizer, and admitted to step-down for further E/M  CRITICAL CARE Performed by: Gerhard Munch   Total critical care time: 35  Critical care time was exclusive of  separately billable procedures and treating other patients.  Critical care was necessary to treat or prevent imminent or life-threatening deterioration.  Critical care was time spent personally by me on the following activities: development of treatment plan with patient and/or surrogate as well as nursing, discussions with consultants, evaluation of patient's response to treatment, examination of patient, obtaining history from patient or surrogate, ordering  and performing treatments and interventions, ordering and review of laboratory studies, ordering and review of radiographic studies, pulse oximetry and re-evaluation of patient's condition.         Gerhard Munch, MD 06/13/11 [phone]

## 2011-06-13 NOTE — Plan of Care (Signed)
Problem: Phase I Progression Outcomes Goal: NPO or per MD order Outcome: Not Applicable Date Met:  06/12/11 MD ordered clear liquids

## 2011-06-13 NOTE — Discharge Summary (Addendum)
DISCHARGE SUMMARY  Mary [name]  MR#: [id number]  DOB:1971/03/20  Date of Admission: 06/12/2011 Date of Discharge: 06/13/2011  Attending Physician:Gwendoline Judy K  Patient's OZH:[id number],IONGEX, MD, MD  Consults:  none  Discharge Diagnoses: Present on Admission:  .DKA (diabetic ketoacidoses) .Cirrhosis of liver .CKD (chronic kidney disease) stage 3, GFR 30-59 ml/min .Seizure disorder .Hep C w/o coma, chronic .Tobacco abuse .Bipolar 1 disorder    Current Discharge Medication List    CONTINUE these medications which have NOT CHANGED   Details  aspirin EC 81 MG tablet Take 81 mg by mouth daily.     ibuprofen (ADVIL,MOTRIN) 200 MG tablet Take 400 mg by mouth every 6 (six) hours as needed. For pain     insulin aspart (NOVOLOG) 100 UNIT/ML injection If Blood sugar 150-250, give yourself 2 units If blood sugar 251-350, give yourself 5 units of insulin If your blood sugar is above 351, take 7 units of insulin and call your MD Qty: 1 vial, Refills: 10    insulin NPH-insulin regular (NOVOLIN 70/30) (70-30) 100 UNIT/ML injection Inject 28 Units into the skin 2 (two) times daily with a meal. Qty: 10 mL, Refills: 12    lithium 300 MG tablet Take 600 mg by mouth at bedtime.      magnesium gluconate (MAGONATE) 500 MG tablet Take 500 mg by mouth 2 (two) times daily.      nicotine (NICOTROL) 10 MG inhaler Inhale 1 puff into the lungs as needed for smoking cessation. Qty: 1 each, Refills: 6    pantoprazole (PROTONIX) 40 MG tablet Take 1 tablet (40 mg total) by mouth daily. Qty: 30 tablet, Refills: 10    QUEtiapine (SEROQUEL) 50 MG tablet Take 50 mg by mouth at bedtime.            Hospital Course: Present on Admission:  .DKA (diabetic ketoacidoses): is patient is a 40 year old African American female with past medical historyof diabetes mellitus, hepatitis C cirrhosis, chronic kidney disease and noncompliance who was just discharged from the hospital service a week ago for  treatment of DKA. Patient states that she took her 7030 insulin for several days but then he ran out she did not get it refilled because she states that she could not afford it. Her background history is that she has a long history of noncompliance both medications as well as followup Dr. appointments. She came back in complaining of nausea and vomiting and abdominal pain and she was found to be in DKA with a blood sugar of 528 and a bicarbonate level of less than 5 giving an anion gap of well over 20. Urine no signs of any infection and this is felt to be secondary to noncompliance. Patient was put on IV fluids plus IV insulin and the DKA protocol was enacted and the patient responded quickly. Within 6-8 hours, her anion gap had normalized and her CBGs were stabilized. He was changed over to by mouth food plus she received 28 units of insulin 7030. She seemed to tolerate this well. She received this 7030 insulin at 3 PM. By 6 PM she was feeling much better. Because the patient has had previous episodes of noncompliance, she's already used up all of her indigent funds. After discussion with health service clinic, the plan will be the following. Patient will get 5 units of regular insulin at approximate 6 PM she will get 40 units of Lantus at 6 PM. She'll then be discharged home. Her insulin 7030 that she received at 3 PM  should wear off some time after midnight around 2 AM. If you units of regular insulin should cover her for the rest of the night and her Lantus should start to come into effect about 5 to 6 AM. This will cover her for most of the day, giving her time for her to pick up her 7030 insulin at health service clinic. Despite the fact that she's not yet had her initial visit health service clinic, she does have an orange card and is therefore able to get her insulin.  .Cirrhosis of liver: Stable.  .CKD (chronic kidney disease) stage 3, GFR 30-59 ml/min-patient reportedly has a history of chronic kidney  disease stage III, although following aggressive IV fluid resuscitation her BUN and creatinine are normal with a creatinine of 0.45 early this morning.  .Seizure disorder-stable. No active seizures. Continue home meds.  .Hep C w/o coma, chronic: Stable.  .Tobacco abuse : Stable.patient counseled not to smoke.  .Bipolar 1 disorder: 3 stable. Continue psychiatric medications including lithium and Seroquel.  Polysubstance abuse: Patient's urine drug screen was positive for benzodiazepines and barbiturates.   Noncompliance: Had an extensive discussion with the patient I told her that if she continues to not take her medications and his doctor's appointments and comes in and repeated episodes of DKA, we may need to have extensive discussions about having her placed as she is not able to properly competently take care of herself.  Leukocytosis: Likely from stress margination, although was noted her white blood cell count increased from when she was first admitted to early this morning. In checking a repeat stat CBC prior to discharge to ensure that this is much improved before sending her home. Day of Discharge BP 100/65  Pulse 81  Temp(Src) 97.4 F (36.3 C) (Oral)  Resp 17  Ht 5\' 4"  (1.626 m)  Wt 47.9 kg (105 lb 9.6 oz)  BMI 18.13 kg/m2  SpO2 100%  Physical Exam:  general: Alert and oriented x3, fatigue, looks older than stated age, in no apparent distress Cardiovascular: Regular rate and rhythm, S1-S2 Lungs: Clear auscultation bilaterally Abdomen: Soft, nontender, nondistended, positive bowel sounds Extremities: No clubbing cyanosis or edema   Results for orders placed during the hospital encounter of 06/12/11 (from the past 24 hour(s))  CBC     Status: Abnormal   Collection Time   06/12/11  7:10 PM      Component Value Range   WBC 14.5 (*) 4.0 - 10.5 (K/uL)   RBC 5.97 (*) 3.87 - 5.11 (MIL/uL)   Hemoglobin 14.4  12.0 - 15.0 (g/dL)   HCT 16.1  09.6 - 04.5 (%)   MCV 76.0 (*) 78.0  - 100.0 (fL)   MCH 24.1 (*) 26.0 - 34.0 (pg)   MCHC 31.7  30.0 - 36.0 (g/dL)   RDW 40.9 (*) 81.1 - 15.5 (%)   Platelets 223  150 - 400 (K/uL)  DIFFERENTIAL     Status: Abnormal   Collection Time   06/12/11  7:10 PM      Component Value Range   Neutrophils Relative 82 (*) 43 - 77 (%)   Neutro Abs 11.9 (*) 1.7 - 7.7 (K/uL)   Lymphocytes Relative 16  12 - 46 (%)   Lymphs Abs 2.3  0.7 - 4.0 (K/uL)   Monocytes Relative 2 (*) 3 - 12 (%)   Monocytes Absolute 0.3  0.1 - 1.0 (K/uL)   Eosinophils Relative 0  0 - 5 (%)   Eosinophils Absolute 0.0  0.0 -  0.7 (K/uL)   Basophils Relative 0  0 - 1 (%)   Basophils Absolute 0.1  0.0 - 0.1 (K/uL)  LIPASE, BLOOD     Status: Normal   Collection Time   06/12/11  7:10 PM      Component Value Range   Lipase 22  11 - 59 (U/L)  COMPREHENSIVE METABOLIC PANEL     Status: Abnormal   Collection Time   06/12/11  7:43 PM      Component Value Range   Sodium 135  135 - 145 (mEq/L)   Potassium 6.0 (*) 3.5 - 5.1 (mEq/L)   Chloride 93 (*) 96 - 112 (mEq/L)   CO2 11 (*) 19 - 32 (mEq/L)   Glucose, Bld 528 (*) 70 - 99 (mg/dL)   BUN 24 (*) 6 - 23 (mg/dL)   Creatinine, Ser 4.09  0.50 - 1.10 (mg/dL)   Calcium 81.1 (*) 8.4 - 10.5 (mg/dL)   Total Protein 8.9 (*) 6.0 - 8.3 (g/dL)   Albumin 4.3  3.5 - 5.2 (g/dL)   AST 93 (*) 0 - 37 (U/L)   ALT 91 (*) 0 - 35 (U/L)   Alkaline Phosphatase 129 (*) 39 - 117 (U/L)   Total Bilirubin 0.3  0.3 - 1.2 (mg/dL)   GFR calc non Af Amer >90  >90 (mL/min)   GFR calc Af Amer >90  >90 (mL/min)  BLOOD GAS, VENOUS     Status: Abnormal   Collection Time   06/12/11  7:55 PM      Component Value Range   O2 Content 2.0     Delivery systems NASAL CANNULA     pH, Ven 7.221 (*) 7.250 - 7.300    pCO2, Ven 39.9 (*) 45.0 - 50.0 (mmHg)   pO2, Ven 54.9 (*) 30.0 - 45.0 (mmHg)   Bicarbonate 15.8 (*) 20.0 - 24.0 (mEq/L)   TCO2 14.6  0 - 100 (mmol/L)   Acid-base deficit 11.1 (*) 0.0 - 2.0 (mmol/L)   O2 Saturation 81.1     Patient temperature  37.0     Collection site VEIN     Drawn by COLLECTED BY LABORATORY     Sample type VEIN    POCT I-STAT, CHEM 8     Status: Abnormal   Collection Time   06/12/11  8:27 PM      Component Value Range   Sodium 141  135 - 145 (mEq/L)   Potassium 5.0  3.5 - 5.1 (mEq/L)   Chloride 111  96 - 112 (mEq/L)   BUN 24 (*) 6 - 23 (mg/dL)   Creatinine, Ser 9.14  0.50 - 1.10 (mg/dL)   Glucose, Bld 782 (*) 70 - 99 (mg/dL)   Calcium, Ion 9.56  2.13 - 1.32 (mmol/L)   TCO2 16  0 - 100 (mmol/L)   Hemoglobin 16.3 (*) 12.0 - 15.0 (g/dL)   HCT 08.6 (*) 57.8 - 46.0 (%)  GLUCOSE, CAPILLARY     Status: Abnormal   Collection Time   06/12/11  8:28 PM      Component Value Range   Glucose-Capillary 471 (*) 70 - 99 (mg/dL)   Comment 1 Notify RN    GLUCOSE, CAPILLARY     Status: Abnormal   Collection Time   06/12/11  9:28 PM      Component Value Range   Glucose-Capillary 455 (*) 70 - 99 (mg/dL)  GLUCOSE, CAPILLARY     Status: Abnormal   Collection Time   06/12/11 10:32 PM  Component Value Range   Glucose-Capillary 395 (*) 70 - 99 (mg/dL)   Comment 1 Notify RN    MRSA PCR SCREENING     Status: Normal   Collection Time   06/12/11 11:23 PM      Component Value Range   MRSA by PCR NEGATIVE  NEGATIVE   GLUCOSE, CAPILLARY     Status: Abnormal   Collection Time   06/12/11 11:40 PM      Component Value Range   Glucose-Capillary 319 (*) 70 - 99 (mg/dL)  GLUCOSE, CAPILLARY     Status: Abnormal   Collection Time   06/13/11 12:32 AM      Component Value Range   Glucose-Capillary 174 (*) 70 - 99 (mg/dL)  BASIC METABOLIC PANEL     Status: Abnormal   Collection Time   06/13/11  1:03 AM      Component Value Range   Sodium 140  135 - 145 (mEq/L)   Potassium 3.7  3.5 - 5.1 (mEq/L)   Chloride 107  96 - 112 (mEq/L)   CO2 20  19 - 32 (mEq/L)   Glucose, Bld 147 (*) 70 - 99 (mg/dL)   BUN 16  6 - 23 (mg/dL)   Creatinine, Ser 1.61 (*) 0.50 - 1.10 (mg/dL)   Calcium 9.2  8.4 - 09.6 (mg/dL)   GFR calc non Af  Amer >90  >90 (mL/min)   GFR calc Af Amer >90  >90 (mL/min)  GLUCOSE, CAPILLARY     Status: Abnormal   Collection Time   06/13/11  1:29 AM      Component Value Range   Glucose-Capillary 117 (*) 70 - 99 (mg/dL)  GLUCOSE, CAPILLARY     Status: Abnormal   Collection Time   06/13/11  1:55 AM      Component Value Range   Glucose-Capillary 112 (*) 70 - 99 (mg/dL)  GLUCOSE, CAPILLARY     Status: Abnormal   Collection Time   06/13/11  2:32 AM      Component Value Range   Glucose-Capillary 143 (*) 70 - 99 (mg/dL)  GLUCOSE, CAPILLARY     Status: Abnormal   Collection Time   06/13/11  3:27 AM      Component Value Range   Glucose-Capillary 169 (*) 70 - 99 (mg/dL)  GLUCOSE, CAPILLARY     Status: Abnormal   Collection Time   06/13/11  4:32 AM      Component Value Range   Glucose-Capillary 200 (*) 70 - 99 (mg/dL)  BASIC METABOLIC PANEL     Status: Abnormal   Collection Time   06/13/11  5:30 AM      Component Value Range   Sodium 138  135 - 145 (mEq/L)   Potassium 4.4  3.5 - 5.1 (mEq/L)   Chloride 107  96 - 112 (mEq/L)   CO2 23  19 - 32 (mEq/L)   Glucose, Bld 187 (*) 70 - 99 (mg/dL)   BUN 14  6 - 23 (mg/dL)   Creatinine, Ser 0.45 (*) 0.50 - 1.10 (mg/dL)   Calcium 8.9  8.4 - 40.9 (mg/dL)   GFR calc non Af Amer >90  >90 (mL/min)   GFR calc Af Amer >90  >90 (mL/min)  CBC     Status: Abnormal   Collection Time   06/13/11  5:30 AM      Component Value Range   WBC 16.9 (*) 4.0 - 10.5 (K/uL)   RBC 4.25  3.87 - 5.11 (MIL/uL)   Hemoglobin  9.9 (*) 12.0 - 15.0 (g/dL)   HCT 78.2 (*) 95.6 - 46.0 (%)   MCV 74.8 (*) 78.0 - 100.0 (fL)   MCH 23.3 (*) 26.0 - 34.0 (pg)   MCHC 31.1  30.0 - 36.0 (g/dL)   RDW 21.3 (*) 08.6 - 15.5 (%)   Platelets 208  150 - 400 (K/uL)  GLUCOSE, CAPILLARY     Status: Abnormal   Collection Time   06/13/11  5:33 AM      Component Value Range   Glucose-Capillary 192 (*) 70 - 99 (mg/dL)  GLUCOSE, CAPILLARY     Status: Abnormal   Collection Time   06/13/11  6:36 AM        Component Value Range   Glucose-Capillary 128 (*) 70 - 99 (mg/dL)  GLUCOSE, CAPILLARY     Status: Abnormal   Collection Time   06/13/11  7:32 AM      Component Value Range   Glucose-Capillary 116 (*) 70 - 99 (mg/dL)  GLUCOSE, CAPILLARY     Status: Abnormal   Collection Time   06/13/11  8:32 AM      Component Value Range   Glucose-Capillary 133 (*) 70 - 99 (mg/dL)   Comment 1 Documented in Chart     Comment 2 Notify RN    GLUCOSE, CAPILLARY     Status: Abnormal   Collection Time   06/13/11  9:37 AM      Component Value Range   Glucose-Capillary 182 (*) 70 - 99 (mg/dL)  GLUCOSE, CAPILLARY     Status: Abnormal   Collection Time   06/13/11 10:26 AM      Component Value Range   Glucose-Capillary 161 (*) 70 - 99 (mg/dL)   Comment 1 Documented in Chart     Comment 2 Notify RN     Comment 3 Glucose Stabilizer    GLUCOSE, CAPILLARY     Status: Abnormal   Collection Time   06/13/11 11:47 AM      Component Value Range   Glucose-Capillary 165 (*) 70 - 99 (mg/dL)  GLUCOSE, CAPILLARY     Status: Abnormal   Collection Time   06/13/11 12:38 PM      Component Value Range   Glucose-Capillary 167 (*) 70 - 99 (mg/dL)   Comment 1 Documented in Chart     Comment 2 Notify RN    URINE RAPID DRUG SCREEN (HOSP PERFORMED)     Status: Abnormal   Collection Time   06/13/11  3:56 PM      Component Value Range   Opiates NONE DETECTED  NONE DETECTED    Cocaine NONE DETECTED  NONE DETECTED    Benzodiazepines NONE DETECTED  NONE DETECTED    Amphetamines NONE DETECTED  NONE DETECTED    Tetrahydrocannabinol POSITIVE (*) NONE DETECTED    Barbiturates POSITIVE (*) NONE DETECTED   GLUCOSE, CAPILLARY     Status: Abnormal   Collection Time   06/13/11  5:22 PM      Component Value Range   Glucose-Capillary 199 (*) 70 - 99 (mg/dL)   Comment 1 Documented in Chart     Comment 2 Notify RN      Disposition: improved, being discharged home    Follow-up Appts: Discharge Orders    Future Orders  Please Complete By Expires   Diet Carb Modified      Increase activity slowly         Follow-up with Dr. Georganna Skeans , PCP at health service clinic next  week    Tests Needing Follow-up:  none   Time spent in discharge (includes decision making & examination of pt):  greater than 2 hours   Signed: Hollice Espy 06/13/2011, 7:02 PM   Addendum: Patient's evening CBC noted an improvement in her white blood cell count from 16.9 on the 15.6. It is possible that this could be stress margination and her rapid recovery may have not given her time for her margination to have resolved. She's having no fevers, her urinalysis from yesterday was fine, oxygen saturations are normal and there does not appear to be any source of infection. Nevertheless, I'm concerned about sending her home and have her return back in a day or 2 in DKA and this time and not being her fault. We'll plan to watch the patient overnight. Transfer upstairs. Repeat blood work in the morning and if her white blood cell count has improved and her be met looks okay, we'll discharge patient to home. The patient will be on sliding scale this evening and she has gotten her Lantus 40 units which will taken to affect tomorrow morning, so she does not needs her 70/30 insulin in the morning. If her labs look okay, she can be immediately discharged home and stop by helps her to get her 7030 insulin.

## 2011-06-14 LAB — GLUCOSE, CAPILLARY: Glucose-Capillary: 217 mg/dL — ABNORMAL HIGH (ref 70–99)

## 2011-06-14 LAB — CBC
MCH: 23.2 pg — ABNORMAL LOW (ref 26.0–34.0)
MCHC: 31.1 g/dL (ref 30.0–36.0)
MCV: 74.7 fL — ABNORMAL LOW (ref 78.0–100.0)
Platelets: 183 10*3/uL (ref 150–400)
RBC: 4.39 MIL/uL (ref 3.87–5.11)
RDW: 15.9 % — ABNORMAL HIGH (ref 11.5–15.5)

## 2011-06-14 LAB — BASIC METABOLIC PANEL
CO2: 21 mEq/L (ref 19–32)
Calcium: 8.9 mg/dL (ref 8.4–10.5)
Creatinine, Ser: 0.46 mg/dL — ABNORMAL LOW (ref 0.50–1.10)
GFR calc non Af Amer: >90 mL/min (ref >90)

## 2011-06-14 NOTE — Progress Notes (Signed)
Discharge instructions given to pt, verbalized understanding, left the unit in stable condition. 

## 2011-06-14 NOTE — Progress Notes (Signed)
PATIENT READMIT,NON COMPLIANT W/PCP F/U,[facility] APPT SET(REINFORCED IMPORTANCE OF KEEPING APPT)ALREADY HAS ORANGE CARD PER [facility].DOES NOT QUALIFY FOR INDIGENT FUNDS. PROVIDED W/HEALTH DEPT M.A.P. PROGRAM TEL#, & ADDRESS FOR MEDS ASST,ALSO [facility] FOR MED[facility].

## 2011-06-14 NOTE — Progress Notes (Signed)
Subjective: Patient doing well this a.m., denies chest pain no nausea or vomiting. Eager to go home. Objective: Vital signs in last 24 hours: Temp:  [97.4 F (36.3 [name])-98.3 F (36.8 [name])] 98 F (36.7 [name]) (12/28 1610) Pulse Rate:  [58-81] 58  (12/28 0637) Resp:  [14-18] 18  (12/28 0637) BP: (100-113)/(65-68) 113/67 mmHg (12/28 0637) SpO2:  [100 %] 100 % (12/28 0637) Weight:  [54 kg (119 lb 0.8 oz)] 119 lb 0.8 oz (54 kg) (12/27 2213) Last BM Date: 06/13/11 Intake/Output from previous day: 12/27 0701 - 12/28 0700 In: 2860 [P.O.:1440; I.V.:1420] Out: 300 [Urine:300] Intake/Output this shift:      General Appearance:    Alert, cooperative, no distress, appears stated age  Lungs:     Clear to auscultation bilaterally, respirations unlabored   Heart:    Regular rate and rhythm, S1 and S2 normal, no murmur, rub   or gallop  Abdomen:     Soft, non-tender, bowel sounds active all four quadrants,    no masses, no organomegaly  Extremities:   Extremities normal, atraumatic, no cyanosis or edema  Neurologic:   CNII-XII intact, normal strength, sensation grossly intact.       Weight change: 6.1 kg (13 lb 7.2 oz)  Intake/Output Summary (Last 24 hours) at 06/14/11 1037 Last data filed at 06/14/11 0700  Gross per 24 hour  Intake   2420 ml  Output    300 ml  Net   2120 ml    Lab Results:   [facility] 06/14/11 0710 06/13/11 0530  NA 133* 138  K 3.5 4.4  CL 106 107  CO2 21 23  GLUCOSE 211* 187*  BUN 13 14  CREATININE 0.46* 0.45*  CALCIUM 8.9 8.9    Basename 06/14/11 0710 06/13/11 1947  WBC 10.5 15.6*  HGB 10.2* 10.3*  HCT 32.8* 32.3*  PLT 183 225  MCV 74.7* 75.6*   PT/INR No results found for this basename: LABPROT:2,INR:2 in the last 72 hours ABG  Basename 06/12/11 1955  PHART --  HCO3 15.8*    Micro Results: Recent Results (from the past 240 hour(s))  MRSA PCR SCREENING     Status: Normal   Collection Time   06/12/11 11:23 PM      Component Value Range Status  Comment   MRSA by PCR NEGATIVE  NEGATIVE  Final    Studies/Results: No results found. Medications:  Scheduled Meds:   . aspirin EC  81 mg Oral Daily  . heparin  5,000 Units Subcutaneous Q8H  . insulin aspart  0-5 Units Subcutaneous QHS  . insulin aspart  0-9 Units Subcutaneous TID WC  . insulin aspart protamine-insulin aspart  28 Units Subcutaneous BID WC  . insulin glargine  40 Units Subcutaneous NOW  . lithium carbonate  600 mg Oral QHS  . QUEtiapine  50 mg Oral QHS  . sodium chloride  500 mL Intravenous Once  . DISCONTD: insulin aspart  5 Units Subcutaneous Once  . DISCONTD: insulin regular  5 Units Subcutaneous Once   Continuous Infusions:   . sodium chloride 100 mL/hr at 06/14/11 0716  . DISCONTD: dextrose 5 % and 0.45% NaCl 100 mL/hr at 06/13/11 0830  . DISCONTD: insulin (NOVOLIN-R) infusion 2 Units/hr (06/13/11 1042)   PRN Meds:.dextrose Assessment/Plan: Patient Active Hospital Problem List: DKA (diabetic ketoacidoses) (04/27/2011) -Resolved, please see Dr. Chancy Milroy discharge summary of 12/27 for the full details of the hospital stay. she is being discharge this a.m. and to [facility] 7030 [facility].  Leukocytosis  -resolved, likely secondary to stress demargination. Patient remaining afebrile and hemodynamically a stable with no source of infection.  Hep [name] w/o coma, chronic (04/27/2011) -Stable    Seizure disorder (04/28/2011) -patient remaining seizure-free , she is to continue her outpatient medications.   Cirrhosis of liver (04/28/2011) -Stable    Non compliance w medication regimen (06/05/2011) -as per Dr. Dr. Chancy Milroy 12/27 note the patient extensively are counseled about the importance of compliance with her medications, and followup appointments.   Tobacco abuse (06/05/2011)  CKD (chronic kidney disease) stage 3, GFR 30-59 ml/min (06/05/2011) - creatinine normalized following aggressive hydration with IV fluids.   Bipolar 1  disorder (06/05/2011)  -patient to continue her outpatient medications.  LOS: 2 days - again please see the full discharge summary of 12/27 with medications and followup plan unchanged.  Mary [name] 06/14/2011, 10:37 AM

## 2011-06-28 ENCOUNTER — Encounter (HOSPITAL_COMMUNITY): Payer: Self-pay

## 2011-06-28 ENCOUNTER — Inpatient Hospital Stay (HOSPITAL_COMMUNITY)
Admission: EM | Admit: 2011-06-28 | Discharge: 2011-06-30 | DRG: 638 | Disposition: A | Discharge status: Home / Self Care | Payer: Medicaid Other | Attending: Internal Medicine | Admitting: Internal Medicine

## 2011-06-28 DIAGNOSIS — E1110 Type 2 diabetes mellitus with ketoacidosis without coma: Secondary | ICD-10-CM | POA: Diagnosis present

## 2011-06-28 DIAGNOSIS — E1010 Type 1 diabetes mellitus with ketoacidosis without coma: Principal | ICD-10-CM | POA: Diagnosis present

## 2011-06-28 DIAGNOSIS — E119 Type 2 diabetes mellitus without complications: Secondary | ICD-10-CM

## 2011-06-28 DIAGNOSIS — F3189 Other bipolar disorder: Secondary | ICD-10-CM | POA: Diagnosis present

## 2011-06-28 DIAGNOSIS — F319 Bipolar disorder, unspecified: Secondary | ICD-10-CM | POA: Diagnosis present

## 2011-06-28 DIAGNOSIS — F3181 Bipolar II disorder: Secondary | ICD-10-CM | POA: Diagnosis present

## 2011-06-28 DIAGNOSIS — R112 Nausea with vomiting, unspecified: Secondary | ICD-10-CM | POA: Diagnosis present

## 2011-06-28 HISTORY — DX: Cardiac murmur, unspecified: R01.1

## 2011-06-28 HISTORY — DX: Unspecified osteoarthritis, unspecified site: M19.90

## 2011-06-28 HISTORY — DX: Anemia, unspecified: D64.9

## 2011-06-28 LAB — URINALYSIS, ROUTINE W REFLEX MICROSCOPIC
Ketones, ur: >80 mg/dL — AB
Leukocytes, UA: NEGATIVE
Nitrite: NEGATIVE
Specific Gravity, Urine: 1.029 (ref 1.005–1.030)
Urobilinogen, UA: 0.2 mg/dL (ref 0.0–1.0)
pH: 5 (ref 5.0–8.0)

## 2011-06-28 LAB — BASIC METABOLIC PANEL
CO2: 17 mEq/L — ABNORMAL LOW (ref 19–32)
GFR calc non Af Amer: >90 mL/min (ref >90)
Glucose, Bld: 266 mg/dL — ABNORMAL HIGH (ref 70–99)
Potassium: 4.5 mEq/L (ref 3.5–5.1)
Sodium: 144 mEq/L (ref 135–145)

## 2011-06-28 LAB — URINE MICROSCOPIC-ADD ON

## 2011-06-28 LAB — DIFFERENTIAL
Basophils Absolute: 0.1 10*3/uL (ref 0.0–0.1)
Basophils Relative: 0 % (ref 0–1)
Eosinophils Absolute: 0 10*3/uL (ref 0.0–0.7)
Eosinophils Relative: 0 % (ref 0–5)
Lymphocytes Relative: 18 % (ref 12–46)

## 2011-06-28 LAB — GLUCOSE, CAPILLARY
Glucose-Capillary: 436 mg/dL — ABNORMAL HIGH (ref 70–99)
Glucose-Capillary: 323 mg/dL — ABNORMAL HIGH (ref 70–99)
Glucose-Capillary: 255 mg/dL — ABNORMAL HIGH (ref 70–99)
Glucose-Capillary: 271 mg/dL — ABNORMAL HIGH (ref 70–99)
Glucose-Capillary: 236 mg/dL — ABNORMAL HIGH (ref 70–99)
Glucose-Capillary: 67 mg/dL — ABNORMAL LOW (ref 70–99)
Glucose-Capillary: 213 mg/dL — ABNORMAL HIGH (ref 70–99)
Glucose-Capillary: 269 mg/dL — ABNORMAL HIGH (ref 70–99)
Glucose-Capillary: 162 mg/dL — ABNORMAL HIGH (ref 70–99)
Glucose-Capillary: 158 mg/dL — ABNORMAL HIGH (ref 70–99)

## 2011-06-28 LAB — CBC
Hemoglobin: 15 g/dL (ref 12.0–15.0)
MCH: 24.3 pg — ABNORMAL LOW (ref 26.0–34.0)
MCH: 24.5 pg — ABNORMAL LOW (ref 26.0–34.0)
MCV: 77.6 fL — ABNORMAL LOW (ref 78.0–100.0)
Platelets: 367 10*3/uL (ref 150–400)
Platelets: 353 10*3/uL (ref 150–400)
RBC: 6.11 MIL/uL — ABNORMAL HIGH (ref 3.87–5.11)
RDW: 15.7 % — ABNORMAL HIGH (ref 11.5–15.5)
WBC: 19 10*3/uL — ABNORMAL HIGH (ref 4.0–10.5)
WBC: 16.7 10*3/uL — ABNORMAL HIGH (ref 4.0–10.5)

## 2011-06-28 LAB — BLOOD GAS, VENOUS
Bicarbonate: 14.3 mEq/L — ABNORMAL LOW (ref 20.0–24.0)
TCO2: 13.5 mmol/L (ref 0–100)
pCO2, Ven: 40.2 mmHg — ABNORMAL LOW (ref 45.0–50.0)
pH, Ven: 7.176 — CL (ref 7.250–7.300)

## 2011-06-28 LAB — COMPREHENSIVE METABOLIC PANEL
ALT: 91 U/L — ABNORMAL HIGH (ref 0–35)
AST: 87 U/L — ABNORMAL HIGH (ref 0–37)
Calcium: 10.6 mg/dL — ABNORMAL HIGH (ref 8.4–10.5)
Sodium: 139 mEq/L (ref 135–145)
Total Protein: 9.7 g/dL — ABNORMAL HIGH (ref 6.0–8.3)

## 2011-06-28 LAB — LACTIC ACID, PLASMA: Lactic Acid, Venous: 2.6 mmol/L — ABNORMAL HIGH (ref 0.5–2.2)

## 2011-06-28 LAB — RAPID URINE DRUG SCREEN, HOSP PERFORMED
Amphetamines: NOT DETECTED
Benzodiazepines: NOT DETECTED

## 2011-06-28 LAB — ETHANOL: Alcohol, Ethyl (B): <11 mg/dL (ref 0–11)

## 2011-06-28 MED ORDER — SODIUM CHLORIDE 0.9 % IV BOLUS (SEPSIS)
2000.0000 mL | Freq: Once | INTRAVENOUS | Status: AC
  Administered 2011-06-28: 1000 mL via INTRAVENOUS

## 2011-06-28 MED ORDER — SODIUM CHLORIDE 0.9 % IV SOLN: INTRAVENOUS | Status: AC

## 2011-06-28 MED ORDER — PANTOPRAZOLE SODIUM 40 MG PO TBEC
40.0000 mg | DELAYED_RELEASE_TABLET | Freq: Every day | ORAL | Status: DC
  Administered 2011-06-28 – 2011-06-30 (×3): 40 mg via ORAL
  Filled 2011-06-28 (×4): qty 1

## 2011-06-28 MED ORDER — FLUCONAZOLE 150 MG PO TABS
150.0000 mg | ORAL_TABLET | Freq: Once | ORAL | Status: AC
  Administered 2011-06-28: 150 mg via ORAL
  Filled 2011-06-28: qty 1

## 2011-06-28 MED ORDER — SODIUM CHLORIDE 0.9 % IV BOLUS (SEPSIS)
1000.0000 mL | Freq: Once | INTRAVENOUS | Status: AC
  Administered 2011-06-28: 1000 mL via INTRAVENOUS

## 2011-06-28 MED ORDER — POTASSIUM CHLORIDE 10 MEQ/100ML IV SOLN
10.0000 meq | INTRAVENOUS | Status: DC
  Administered 2011-06-28: 10 meq via INTRAVENOUS
  Filled 2011-06-28: qty 100

## 2011-06-28 MED ORDER — SODIUM CHLORIDE 0.9 % IV SOLN
INTRAVENOUS | Status: DC
  Administered 2011-06-28: 23:00:00 via INTRAVENOUS
  Filled 2011-06-28: qty 1

## 2011-06-28 MED ORDER — DEXTROSE 50 % IV SOLN
25.0000 mL | INTRAVENOUS | Status: DC | PRN
  Filled 2011-06-28: qty 50

## 2011-06-28 MED ORDER — ASPIRIN 325 MG PO TABS
ORAL_TABLET | ORAL | Status: AC
  Administered 2011-06-28: 325 mg
  Filled 2011-06-28: qty 1

## 2011-06-28 MED ORDER — SODIUM CHLORIDE 0.9 % IV SOLN
INTRAVENOUS | Status: DC
  Administered 2011-06-28: 14:00:00 via INTRAVENOUS
  Administered 2011-06-29: 125 mL/h via INTRAVENOUS

## 2011-06-28 MED ORDER — SODIUM CHLORIDE 0.9 % IV SOLN
INTRAVENOUS | Status: DC
  Administered 2011-06-28: 4.5 [IU]/h via INTRAVENOUS
  Filled 2011-06-28: qty 1

## 2011-06-28 MED ORDER — HYDROMORPHONE HCL PF 1 MG/ML IJ SOLN
1.0000 mg | Freq: Once | INTRAMUSCULAR | Status: AC
  Administered 2011-06-28: 1 mg via INTRAVENOUS
  Filled 2011-06-28: qty 1

## 2011-06-28 MED ORDER — LITHIUM CARBONATE 300 MG PO CAPS
600.0000 mg | ORAL_CAPSULE | Freq: Every day | ORAL | Status: DC
  Administered 2011-06-28 – 2011-06-29 (×2): 600 mg via ORAL
  Filled 2011-06-28 (×5): qty 2

## 2011-06-28 MED ORDER — ONDANSETRON HCL 4 MG/2ML IJ SOLN
4.0000 mg | Freq: Four times a day (QID) | INTRAMUSCULAR | Status: DC | PRN
  Filled 2011-06-28: qty 2

## 2011-06-28 MED ORDER — DEXTROSE-NACL 5-0.45 % IV SOLN
INTRAVENOUS | Status: DC
  Administered 2011-06-28: 75 mL/h via INTRAVENOUS
  Administered 2011-06-28 – 2011-06-29 (×2): via INTRAVENOUS

## 2011-06-28 MED ORDER — DEXTROSE 50 % IV SOLN
INTRAVENOUS | Status: AC
  Filled 2011-06-28: qty 50

## 2011-06-28 MED ORDER — MORPHINE SULFATE 2 MG/ML IJ SOLN
1.0000 mg | INTRAMUSCULAR | Status: DC | PRN
  Administered 2011-06-28 (×2): 2 mg via INTRAVENOUS
  Filled 2011-06-28 (×2): qty 1

## 2011-06-28 MED ORDER — QUETIAPINE FUMARATE 50 MG PO TABS
50.0000 mg | ORAL_TABLET | Freq: Every day | ORAL | Status: DC
  Administered 2011-06-28 – 2011-06-29 (×2): 50 mg via ORAL
  Filled 2011-06-28 (×5): qty 1

## 2011-06-28 MED ORDER — ONDANSETRON HCL 4 MG/2ML IJ SOLN
4.0000 mg | Freq: Once | INTRAMUSCULAR | Status: AC
  Administered 2011-06-28: 4 mg via INTRAVENOUS
  Filled 2011-06-28: qty 2

## 2011-06-28 MED ORDER — MAGNESIUM GLUCONATE 500 MG PO TABS
500.0000 mg | ORAL_TABLET | Freq: Two times a day (BID) | ORAL | Status: DC
  Administered 2011-06-28 – 2011-06-30 (×4): 500 mg via ORAL
  Filled 2011-06-28 (×7): qty 1

## 2011-06-28 MED ORDER — ACETAMINOPHEN 325 MG PO TABS
650.0000 mg | ORAL_TABLET | Freq: Four times a day (QID) | ORAL | Status: DC | PRN
  Filled 2011-06-28: qty 2

## 2011-06-28 MED ORDER — ENOXAPARIN SODIUM 40 MG/0.4ML ~~LOC~~ SOLN
40.0000 mg | SUBCUTANEOUS | Status: DC
  Administered 2011-06-28 – 2011-06-29 (×2): 40 mg via SUBCUTANEOUS
  Filled 2011-06-28 (×4): qty 0.4

## 2011-06-28 MED ORDER — ASPIRIN EC 81 MG PO TBEC
81.0000 mg | DELAYED_RELEASE_TABLET | Freq: Every day | ORAL | Status: DC
  Administered 2011-06-29 – 2011-06-30 (×2): 81 mg via ORAL
  Filled 2011-06-28 (×3): qty 1

## 2011-06-28 NOTE — ED Notes (Signed)
Unable to get all labs. RN aware. 

## 2011-06-28 NOTE — Progress Notes (Signed)
Pt states she has an appointment on July 01, 2011 at [facility] with pcp amelia Andrey Campanile

## 2011-06-28 NOTE — ED Notes (Signed)
CBG measurement of 162mg . RN advised. ENM

## 2011-06-28 NOTE — ED Notes (Signed)
UXL:KG40<NU> Expected date:06/28/11<BR> Expected time: 2:31 AM<BR> Means of arrival:Ambulance<BR> Comments:<BR> N,v abd pain

## 2011-06-28 NOTE — ED Provider Notes (Addendum)
History     CSN: [id number]  Arrival date & time 06/28/11  0245   None     Chief Complaint  Patient presents with  . GI Problem    (Consider location/radiation/quality/duration/timing/severity/associated sxs/prior treatment) HPI Is a 41 year old female with a long-standing history of insulin-dependent diabetes. She complains of abdominal pain, nausea and vomiting that began yesterday. She describes her symptoms as severe. She denies diarrhea. The abdominal pain is present diffusely. It is worse with palpation or movement. She states she has been using her insulin regularly but EMS noted her to have a sugar of 489 and route. She has had frequent urination and frequent thirst.  Past Medical History  Diagnosis Date  . Diabetes mellitus     diagnosed in 1996-always been on insulin  . Hep C w/o coma, chronic     biopsy in 2010-no rx-was supposed to see a hepatologist in [place]  . Kidney stone   . Seizure disorder     started with pregnancy of first son  . Gastritis   . Bipolar 2 disorder   . Depression   . Post traumatic stress disorder     "flipping out" after people close to her died  . DKA (diabetic ketoacidoses)     Recurrent admissions for DKA, medication non-complaince, poor social situation  . Cirrhosis     Past Surgical History  Procedure Date  . Ankle surgery   . Cesarean section     Family History  Problem Relation Age of Onset  . Diabetes Mother     currently 14  . Fibromyalgia Mother   . Cirrhosis Father     died in 1997-11-28    History  Substance Use Topics  . Smoking status: Current Everyday Smoker -- 0.2 packs/day for 25 years  . Smokeless tobacco: Never Used  . Alcohol Use: No     Endorses hasn't been drinking    OB History    Grav Para Term Preterm Abortions TAB SAB Ect Mult Living                  Review of Systems  All other systems reviewed and are negative.    Allergies  Penicillins  Home Medications   Current Outpatient Rx   Name Route Sig Dispense Refill  . ASPIRIN EC 81 MG PO TBEC Oral Take 81 mg by mouth daily.     . IBUPROFEN 200 MG PO TABS Oral Take 400 mg by mouth every 6 (six) hours as needed. For pain     . INSULIN ASPART 100 UNIT/ML Lillie SOLN  If Blood sugar 150-250, give yourself 2 units If blood sugar 251-350, give yourself 5 units of insulin If your blood sugar is above 351, take 7 units of insulin and call your MD 1 vial 10  . INSULIN ISOPHANE & REGULAR (70-30) 100 UNIT/ML [place] SUSP Subcutaneous Inject 28 Units into the skin 2 (two) times daily with a meal. 10 mL 12  . LITHIUM CARBONATE 300 MG PO TABS Oral Take 600 mg by mouth at bedtime.      Marland Kitchen MAGNESIUM GLUCONATE 500 MG PO TABS Oral Take 500 mg by mouth 2 (two) times daily.      Marland Kitchen NICOTINE 10 MG IN INHA Inhalation Inhale 1 puff into the lungs as needed for smoking cessation. 1 each 6  . PANTOPRAZOLE SODIUM 40 MG PO TBEC Oral Take 1 tablet (40 mg total) by mouth daily. 30 tablet 10  . QUETIAPINE FUMARATE 50 MG PO  TABS Oral Take 50 mg by mouth at bedtime.        BP 111/90  Pulse 89  Temp(Src) 97.6 F (36.4 C) (Oral)  Resp 16  SpO2 100%  Physical Exam General: Well-developed, cachectic female in no acute distress; appears older than age of record HENT: normocephalic, atraumatic; mucous membranes dry Eyes: pupils equal round and reactive to light; extraocular muscles intact Neck: supple Heart: regular rate and rhythm Lungs: clear to auscultation bilaterally Abdomen: soft; nondistended; severe diffuse tenderness Extremities: No deformity; full range of motion; pulses normal Neurologic: Awake, alert and oriented; motor function intact in all extremities and symmetric; no facial droop Skin: Warm and dry; multiple skin lesions suspicious for needle tracks Psychiatric: Normal mood and affect    ED Course  Procedures (including critical care time)  CRITICAL CARE Performed by: Kinsey Cowsert L   Total critical care time: 35  Critical care time  was exclusive of separately billable procedures and treating other patients.  Critical care was necessary to treat or prevent imminent or life-threatening deterioration.  Critical care was time spent personally by me on the following activities: development of treatment plan with patient and/or surrogate as well as nursing, discussions with consultants, evaluation of patient's response to treatment, examination of patient, obtaining history from patient or surrogate, ordering and performing treatments and interventions, ordering and review of laboratory studies, ordering and review of radiographic studies, pulse oximetry and re-evaluation of patient's condition.     MDM   Nursing notes and vitals signs, including pulse oximetry, reviewed.  Summary of this visit's results, reviewed by myself:  Labs:  Results for orders placed during the hospital encounter of 06/28/11  URINE RAPID DRUG SCREEN (HOSP PERFORMED)      Component Value Range   Opiates NONE DETECTED  NONE DETECTED    Cocaine NONE DETECTED  NONE DETECTED    Benzodiazepines NONE DETECTED  NONE DETECTED    Amphetamines NONE DETECTED  NONE DETECTED    Tetrahydrocannabinol NONE DETECTED  NONE DETECTED    Barbiturates NONE DETECTED  NONE DETECTED   CBC      Component Value Range   WBC 19.0 (*) 4.0 - 10.5 (K/uL)   RBC 5.80 (*) 3.87 - 5.11 (MIL/uL)   Hemoglobin 14.1  12.0 - 15.0 (g/dL)   HCT 16.1  09.6 - 04.5 (%)   MCV 77.6 (*) 78.0 - 100.0 (fL)   MCH 24.3 (*) 26.0 - 34.0 (pg)   MCHC 31.3  30.0 - 36.0 (g/dL)   RDW 40.9 (*) 81.1 - 15.5 (%)   Platelets 367  150 - 400 (K/uL)  DIFFERENTIAL      Component Value Range   Neutrophils Relative 79 (*) 43 - 77 (%)   Neutro Abs 15.0 (*) 1.7 - 7.7 (K/uL)   Lymphocytes Relative 18  12 - 46 (%)   Lymphs Abs 3.4  0.7 - 4.0 (K/uL)   Monocytes Relative 3  3 - 12 (%)   Monocytes Absolute 0.5  0.1 - 1.0 (K/uL)   Eosinophils Relative 0  0 - 5 (%)   Eosinophils Absolute 0.0  0.0 - 0.7 (K/uL)     Basophils Relative 0  0 - 1 (%)   Basophils Absolute 0.1  0.0 - 0.1 (K/uL)  COMPREHENSIVE METABOLIC PANEL      Component Value Range   Sodium 139  135 - 145 (mEq/L)   Potassium 4.7  3.5 - 5.1 (mEq/L)   Chloride 91 (*) 96 - 112 (mEq/L)   CO2  14 (*) 19 - 32 (mEq/L)   Glucose, Bld 589 (*) 70 - 99 (mg/dL)   BUN 24 (*) 6 - 23 (mg/dL)   Creatinine, Ser 9.60  0.50 - 1.10 (mg/dL)   Calcium 45.4 (*) 8.4 - 10.5 (mg/dL)   Total Protein 9.7 (*) 6.0 - 8.3 (g/dL)   Albumin 4.9  3.5 - 5.2 (g/dL)   AST 87 (*) 0 - 37 (U/L)   ALT 91 (*) 0 - 35 (U/L)   Alkaline Phosphatase 168 (*) 39 - 117 (U/L)   Total Bilirubin 0.4  0.3 - 1.2 (mg/dL)   GFR calc non Af Amer >90  >90 (mL/min)   GFR calc Af Amer >90  >90 (mL/min)  LIPASE, BLOOD      Component Value Range   Lipase 32  11 - 59 (U/L)  BLOOD GAS, VENOUS      Component Value Range   FIO2 0.21     pH, Ven 7.176 (*) 7.250 - 7.300    pCO2, Ven 40.2 (*) 45.0 - 50.0 (mmHg)   pO2, Ven 41.7  30.0 - 45.0 (mmHg)   Bicarbonate 14.3 (*) 20.0 - 24.0 (mEq/L)   TCO2 13.5  0 - 100 (mmol/L)   Acid-base deficit 13.5 (*) 0.0 - 2.0 (mmol/L)   O2 Saturation 63.1     Patient temperature 98.6     Collection site VEIN     Drawn by COLLECTED BY LABORATORY     Sample type VEIN    ETHANOL      Component Value Range   Alcohol, Ethyl (B) <11  0 - 11 (mg/dL)  URINALYSIS, ROUTINE W REFLEX MICROSCOPIC      Component Value Range   Color, Urine YELLOW  YELLOW    APPearance CLEAR  CLEAR    Specific Gravity, Urine 1.029  1.005 - 1.030    pH 5.0  5.0 - 8.0    Glucose, UA >1000 (*) NEGATIVE (mg/dL)   Hgb urine dipstick NEGATIVE  NEGATIVE    Bilirubin Urine NEGATIVE  NEGATIVE    Ketones, ur >80 (*) NEGATIVE (mg/dL)   Protein, ur NEGATIVE  NEGATIVE (mg/dL)   Urobilinogen, UA 0.2  0.0 - 1.0 (mg/dL)   Nitrite NEGATIVE  NEGATIVE    Leukocytes, UA NEGATIVE  NEGATIVE   AMMONIA      Component Value Range   Ammonia 17  11 - 60 (umol/L)  LITHIUM LEVEL      Component Value  Range   Lithium Lvl 0.51 (*) 0.80 - 1.40 (mEq/L)  URINE MICROSCOPIC-ADD ON      Component Value Range   Squamous Epithelial / LPF MANY (*) RARE    WBC, UA 0-2  <3 (WBC/hpf)   RBC / HPF 0-2  <3 (RBC/hpf)   Bacteria, UA RARE  RARE    Urine-Other RARE YEAST    GLUCOSE, CAPILLARY      Component Value Range   Glucose-Capillary 511 (*) 70 - 99 (mg/dL)  GLUCOSE, CAPILLARY      Component Value Range   Glucose-Capillary 436 (*) 70 - 99 (mg/dL)   0:98 AM IV fluid bolus and insulin drip per Glucose Stabilizer ordered after initial evaluation.  6:32 AM [facility] hospitalist to admit.          [name], MD 06/28/11 1191  [name], MD 06/28/11 [id number]

## 2011-06-28 NOTE — ED Notes (Signed)
Per EMS, pt from home.  Pt states n/v abd pain x 2 days.  No IV in route. Pt diabetic.  CBG 489 in route.  Hep C, Gallstones, bipolar, Peptic ulcer, Scoliosis.  Vitals: 100/70, 80, 20,  Pain 10/10

## 2011-06-28 NOTE — ED Notes (Signed)
No complaints of pain at present time.

## 2011-06-28 NOTE — H&P (Signed)
Hospital Admission Note Date: 06/28/2011  Patient name: Mary [name]           Medical record number: [id number] Date of birth: June 17, 1971           Age: 41 y.o.   Gender: female    PCP:   Georganna Skeans, MD, MD   Chief Complaint:  Abdominal pain  HPI: Mary [name] is a 41 y.o. female with past medical history of type 1 diabetes mellitus, bipolar affective disorder and hepatitis C. Patient came into the hospital complaining about abdominal pain. Patient was in her usual state of health till day before yesterday when she started to have epigastric pain. This was quickly followed by nausea and then she started to vomit. The vomiting was so severe so she came in to the hospital for further evaluation. Upon initial evaluation in the emergency department, her bicarbonate is 14. And she was found to have blood glucose of 589. Patient denies any cough denies any burning while passing urine she'll be admitted to the hospital for further evaluation she started already in glucose drip. And in her    Past Medical History: Past Medical History  Diagnosis Date  . Diabetes mellitus     diagnosed in 1996-always been on insulin  . Hep C w/o coma, chronic     biopsy in 2010-no rx-was supposed to see a hepatologist in [place]  . Kidney stone   . Seizure disorder     started with pregnancy of first son  . Gastritis   . Bipolar 2 disorder   . Depression   . Post traumatic stress disorder     "flipping out" after people close to her died  . DKA (diabetic ketoacidoses)     Recurrent admissions for DKA, medication non-complaince, poor social situation  . Cirrhosis    Past Surgical History  Procedure Date  . Ankle surgery   . Cesarean section     Medications: Prior to Admission medications   Medication Sig Start Date End Date Taking? Authorizing Provider  aspirin EC 81 MG tablet Take 81 mg by mouth daily.    Yes Historical Provider, MD  ibuprofen (ADVIL,MOTRIN) 200 MG tablet Take 400 mg  by mouth every 6 (six) hours as needed. For pain    Yes Historical Provider, MD  insulin aspart (NOVOLOG) 100 UNIT/ML injection If Blood sugar 150-250, give yourself 2 units If blood sugar 251-350, give yourself 5 units of insulin If your blood sugar is above 351, take 7 units of insulin and call your MD 06/05/11  Yes Christina Rama  insulin NPH-insulin regular (NOVOLIN 70/30) (70-30) 100 UNIT/ML injection Inject 28 Units into the skin 2 (two) times daily with a meal. 06/05/11 06/04/12 Yes Christina Rama  lithium 300 MG tablet Take 600 mg by mouth at bedtime.     Yes Historical Provider, MD  magnesium gluconate (MAGONATE) 500 MG tablet Take 500 mg by mouth 2 (two) times daily.     Yes Historical Provider, MD  nicotine (NICOTROL) 10 MG inhaler Inhale 1 puff into the lungs as needed for smoking cessation. 06/05/11 07/05/11 Yes Christina Rama  pantoprazole (PROTONIX) 40 MG tablet Take 1 tablet (40 mg total) by mouth daily. 06/05/11  Yes Christina Rama  QUEtiapine (SEROQUEL) 50 MG tablet Take 50 mg by mouth at bedtime.     Yes Historical Provider, MD    Allergies:   Allergies  Allergen Reactions  . Penicillins Rash    Social History:  reports that she has  been smoking.  She has never used smokeless tobacco. She reports that she does not drink alcohol. Her drug history not on file.  Family History: Family History  Problem Relation Age of Onset  . Diabetes Mother     currently 66  . Fibromyalgia Mother   . Cirrhosis Father     died in 1997/11/24    Review of Systems:  Constitutional: negative for anorexia, fevers and sweats Eyes: negative for irritation, redness and visual disturbance Ears, nose, mouth, throat, and face: negative for earaches, epistaxis, nasal congestion and sore throat Respiratory: negative for cough, dyspnea on exertion, sputum and wheezing Cardiovascular: negative for chest pain, dyspnea, lower extremity edema, orthopnea, palpitations and syncope Gastrointestinal:  negative for abdominal pain, constipation, diarrhea, melena, nausea and vomiting Genitourinary:negative for dysuria, frequency and hematuria Hematologic/lymphatic: negative for bleeding, easy bruising and lymphadenopathy Musculoskeletal:negative for arthralgias, muscle weakness and stiff joints Neurological: negative for coordination problems, gait problems, headaches and weakness Endocrine: negative for diabetic symptoms including polydipsia, polyuria and weight loss Allergic/Immunologic: negative for anaphylaxis, hay fever and urticaria  Physical Exam: BP 115/78  Pulse 89  Temp(Src) 97.6 F (36.4 C) (Oral)  Resp 16  SpO2 100% General appearance: alert, cooperative and no distress  Head: Normocephalic, without obvious abnormality, atraumatic  Eyes: conjunctivae/corneas clear. PERRL, EOM's intact. Fundi benign.  Nose: Nares normal. Septum midline. Mucosa normal. No drainage or sinus tenderness.  Throat: lips, mucosa, and tongue normal; teeth and gums normal  Neck: Supple, no masses, no cervical lymphadenopathy, no JVD appreciated, no meningeal signs Resp: clear to auscultation bilaterally  Chest wall: no tenderness  Cardio: regular rate and rhythm, S1, S2 normal, no murmur, click, rub or gallop  GI: soft, non-tender; bowel sounds normal; no masses, no organomegaly  Extremities: extremities normal, atraumatic, no cyanosis or edema  Skin: Skin color, texture, turgor normal. No rashes or lesions  Neurologic: Alert and oriented X 3, normal strength and tone. Normal symmetric reflexes. Normal coordination and gait  Labs on Admission:   [facility] 06/28/11 0303  NA 139  K 4.7  CL 91*  CO2 14*  GLUCOSE 589*  BUN 24*  CREATININE 0.74  CALCIUM 10.6*  MG --  PHOS --    Basename 06/28/11 0303  AST 87*  ALT 91*  ALKPHOS 168*  BILITOT 0.4  PROT 9.7*  ALBUMIN 4.9    Basename 06/28/11 0303  LIPASE 32  AMYLASE --    Basename 06/28/11 0303  WBC 19.0*  NEUTROABS 15.0*  HGB  14.1  HCT 45.0  MCV 77.6*  PLT 367   Radiological Exams on Admission: No results found.   IMPRESSION: Present on Admission:  .DKA (diabetic ketoacidoses) .Bipolar 2 disorder .Cirrhosis of liver .Bipolar 1 disorder  Assessment/Plan  1. DKA: Patient has frequent visits to the hospital was DKA's. It was attributed to nonadherence with medication from before. Patient denies any shortness of breath/burning in the urine her chest x-ray and urinalysis were clear. Patient started on glucose drip. She is going be on clear liquids. She already had 4 L of normal saline and aggressive IV fluid hydration will continue. He switched her to subcutaneous insulin when her anion gap closes. She has anion gap of 34.  2. Diabetes mellitus type 1: Hemoglobin A1c was 14.3 and 05/15/2011. We'll recheck it today. Patient supposed to be on Lantus insulin 40 units will restart that when her gap is closed.  3. Bipolar affective disorder: Patient is on lithium and Seroquel this will be continued throughout the  hospital stay. Her lithium level is in the lowest side.  4. History of cirrhosis of the liver: With high AST/ALT and alkaline phosphatase. No signs or symptoms of decompensation.  5. History of substance abuse: Patient denies any recent street drug use. Her urine tox screen is negative.  Mirabelle Cyphers A 06/28/2011, 8:44 AM

## 2011-06-28 NOTE — ED Notes (Signed)
PATIENT RESTING WAITING FOR BED ON FLOOR.

## 2011-06-29 ENCOUNTER — Encounter (HOSPITAL_COMMUNITY): Payer: Self-pay | Admitting: *Deleted

## 2011-06-29 DIAGNOSIS — R112 Nausea with vomiting, unspecified: Secondary | ICD-10-CM | POA: Diagnosis present

## 2011-06-29 DIAGNOSIS — E1010 Type 1 diabetes mellitus with ketoacidosis without coma: Secondary | ICD-10-CM | POA: Diagnosis present

## 2011-06-29 DIAGNOSIS — R1013 Epigastric pain: Secondary | ICD-10-CM | POA: Diagnosis present

## 2011-06-29 DIAGNOSIS — F3189 Other bipolar disorder: Secondary | ICD-10-CM | POA: Diagnosis present

## 2011-06-29 LAB — GLUCOSE, CAPILLARY
Glucose-Capillary: 179 mg/dL — ABNORMAL HIGH (ref 70–99)
Glucose-Capillary: 123 mg/dL — ABNORMAL HIGH (ref 70–99)
Glucose-Capillary: 159 mg/dL — ABNORMAL HIGH (ref 70–99)
Glucose-Capillary: 148 mg/dL — ABNORMAL HIGH (ref 70–99)
Glucose-Capillary: 112 mg/dL — ABNORMAL HIGH (ref 70–99)
Glucose-Capillary: 326 mg/dL — ABNORMAL HIGH (ref 70–99)
Glucose-Capillary: 158 mg/dL — ABNORMAL HIGH (ref 70–99)

## 2011-06-29 LAB — BASIC METABOLIC PANEL
BUN: 11 mg/dL (ref 6–23)
BUN: 12 mg/dL (ref 6–23)
CO2: 18 mEq/L — ABNORMAL LOW (ref 19–32)
Calcium: 8.6 mg/dL (ref 8.4–10.5)
Chloride: 104 mEq/L (ref 96–112)
Chloride: 104 mEq/L (ref 96–112)
Creatinine, Ser: 0.52 mg/dL (ref 0.50–1.10)
GFR calc Af Amer: >90 mL/min (ref >90)
GFR calc Af Amer: >90 mL/min (ref >90)
GFR calc non Af Amer: >90 mL/min (ref >90)
GFR calc non Af Amer: >90 mL/min (ref >90)
GFR calc non Af Amer: >90 mL/min (ref >90)
Glucose, Bld: 330 mg/dL — ABNORMAL HIGH (ref 70–99)
Potassium: 3.5 mEq/L (ref 3.5–5.1)
Potassium: 4.8 mEq/L (ref 3.5–5.1)
Sodium: 134 mEq/L — ABNORMAL LOW (ref 135–145)
Sodium: 132 mEq/L — ABNORMAL LOW (ref 135–145)

## 2011-06-29 LAB — HEMOGLOBIN A1C
Hgb A1c MFr Bld: 13.8 % — ABNORMAL HIGH (ref <5.7)
Mean Plasma Glucose: 349 mg/dL — ABNORMAL HIGH (ref <117)

## 2011-06-29 MED ORDER — INSULIN ASPART 100 UNIT/ML ~~LOC~~ SOLN
0.0000 [IU] | Freq: Three times a day (TID) | SUBCUTANEOUS | Status: DC
  Administered 2011-06-29: 0 [IU] via SUBCUTANEOUS
  Administered 2011-06-29: 7 [IU] via SUBCUTANEOUS
  Administered 2011-06-30: 5 [IU] via SUBCUTANEOUS
  Administered 2011-06-30: 2 [IU] via SUBCUTANEOUS

## 2011-06-29 MED ORDER — INSULIN ASPART 100 UNIT/ML ~~LOC~~ SOLN
4.0000 [IU] | Freq: Three times a day (TID) | SUBCUTANEOUS | Status: DC
  Administered 2011-06-29 – 2011-06-30 (×3): 4 [IU] via SUBCUTANEOUS
  Filled 2011-06-29: qty 3

## 2011-06-29 MED ORDER — INSULIN GLARGINE 100 UNIT/ML ~~LOC~~ SOLN
30.0000 [IU] | Freq: Every day | SUBCUTANEOUS | Status: DC
  Administered 2011-06-29: 30 [IU] via SUBCUTANEOUS
  Filled 2011-06-29: qty 3

## 2011-06-29 MED ORDER — POTASSIUM CHLORIDE CRYS ER 20 MEQ PO TBCR
40.0000 meq | EXTENDED_RELEASE_TABLET | Freq: Three times a day (TID) | ORAL | Status: AC
  Administered 2011-06-29 (×2): 40 meq via ORAL
  Filled 2011-06-29 (×2): qty 2

## 2011-06-29 NOTE — ED Notes (Signed)
Report called to Arlinda, RN on [address]. Pt to be transferred to room 1419.

## 2011-06-29 NOTE — Progress Notes (Signed)
DAILY PROGRESS NOTE                              GENERAL INTERNAL MEDICINE [facility]  SUBJECTIVE: Feels much better no complaints.  OBJECTIVE: BP 100/58  Pulse 80  Temp(Src) 98.5 F (36.9 C) (Oral)  Resp 16  SpO2 94%  LMP 06/29/2011 No intake or output data in the 24 hours ending 06/29/11 1006                    Weight change:  Physical Exam: General: Alert and awake oriented x3 not in any acute distress. HEENT: anicteric sclera, pupils equal reactive to light and accommodation CVS: S1-S2 heard, no murmur rubs or gallops Chest: clear to auscultation bilaterally, no wheezing rales or rhonchi Abdomen:  normal bowel sounds, soft, nontender, nondistended, no organomegaly Neuro: Cranial nerves II-XII intact, no focal neurological deficits Extremities: no cyanosis, no clubbing or edema noted bilaterally   Lab Results:  Basename 06/29/11 0745 06/29/11  NA 135 134*  K 3.4* 3.5  CL 107 104  CO2 21 20  GLUCOSE 176* 170*  BUN 11 13  CREATININE 0.52 0.52  CALCIUM 8.6 8.3*  MG -- --  PHOS -- --    Basename 06/28/11 0303  AST 87*  ALT 91*  ALKPHOS 168*  BILITOT 0.4  PROT 9.7*  ALBUMIN 4.9    Basename 06/28/11 0303  LIPASE 32  AMYLASE --    Basename 06/28/11 1006 06/28/11 0303  WBC 16.7* 19.0*  NEUTROABS -- 15.0*  HGB 15.0 14.1  HCT 46.8* 45.0  MCV 76.6* 77.6*  PLT 353 367   Basename 06/28/11 1006  HGBA1C 13.8*   Basename 06/28/11 1006  TSH 0.747  T4TOTAL --  T3FREE --  THYROIDAB --   Micro Results: No results found for this or any previous visit (from the past 240 hour(s)).  Studies/Results: No results found. Medications: Scheduled Meds:   . aspirin      . aspirin EC  81 mg Oral Daily  . enoxaparin (LOVENOX) injection  40 mg Subcutaneous Q24H  . lithium carbonate  600 mg Oral QHS  . magnesium gluconate  500 mg Oral BID  . pantoprazole  40 mg Oral Daily  . QUEtiapine  50 mg Oral QHS   Continuous Infusions:   . sodium chloride    .  sodium chloride 125 mL/hr at 06/28/11 1414  . dextrose 5 % and 0.45% NaCl 125 mL/hr at 06/29/11 0402  . insulin (NOVOLIN-R) infusion 2.6 Units/hr (06/29/11 0807)  . DISCONTD: insulin (NOVOLIN-R) infusion 6.3 Units/hr (06/28/11 1917)   PRN Meds:.acetaminophen, dextrose, morphine injection, ondansetron (ZOFRAN) IV  ASSESSMENT & PLAN: Active Problems:  DKA (diabetic ketoacidoses)  Bipolar 2 disorder  Cirrhosis of liver  Bipolar 1 disorder  1. DKA: The anion gap closed. She is not acidotic anymore. I will discontinue the IV insulin and dextrose. Will start patient on subcutaneous insulin, alert he. Her bicarbonate is 21 this morning. There is no evidence of infection. This is likely secondary to nonadherence to diabetes regimen.  2. IDDM: Uncontrolled, her hemoglobin A1c is 13.8 it's better than last time which was 14.3. Patient is very confused about her regimen. She thinks she still taken 70/30 mix and Lantus as needed. Also part of this confusion is nonadherence to her medications.  3. Bipolar disorder: This is been stable her medication being continued. She is on lithium and Seroquel.    LOS:  1 day   Deandrea Vanpelt A 06/29/2011, 10:06 AM

## 2011-06-29 NOTE — ED Notes (Signed)
Insulin gtt rate increased to 2.6

## 2011-06-29 NOTE — Plan of Care (Signed)
Problem: Consults Goal: Diabetic Ketoacidosis (DKA) Patient Education See Patient Education Modules for education specifics. Outcome: Progressing Glucose Stabilizer in progress-CBG monitoring. AW

## 2011-06-29 NOTE — Plan of Care (Signed)
Problem: Consults Goal: Diabetic Ketoacidosis (DKA) Patient Education See Patient Education Modules for education specifics. Outcome: Progressing Pt explained reasons for CBG monitoring. AW

## 2011-06-29 NOTE — ED Notes (Signed)
Patients cbgs within target range x 4, PA on call contacted. Orders given to keep patient on insulin drip until MD rounds this am.

## 2011-06-30 LAB — BASIC METABOLIC PANEL
BUN: 11 mg/dL (ref 6–23)
CO2: 17 mEq/L — ABNORMAL LOW (ref 19–32)
Chloride: 106 mEq/L (ref 96–112)
GFR calc Af Amer: >90 mL/min (ref >90)
Glucose, Bld: 313 mg/dL — ABNORMAL HIGH (ref 70–99)
Potassium: 4.3 mEq/L (ref 3.5–5.1)

## 2011-06-30 LAB — GLUCOSE, CAPILLARY: Glucose-Capillary: 176 mg/dL — ABNORMAL HIGH (ref 70–99)

## 2011-06-30 LAB — CBC
HCT: 34.3 % — ABNORMAL LOW (ref 36.0–46.0)
MCHC: 31.8 g/dL (ref 30.0–36.0)
Platelets: 209 10*3/uL (ref 150–400)
RDW: 15.5 % (ref 11.5–15.5)

## 2011-06-30 MED ORDER — INSULIN GLARGINE 100 UNIT/ML ~~LOC~~ SOLN: 35.0000 [IU] | Freq: Every day | SUBCUTANEOUS | Status: DC

## 2011-06-30 MED ORDER — INSULIN ASPART 100 UNIT/ML ~~LOC~~ SOLN: 6.0000 [IU] | Freq: Three times a day (TID) | SUBCUTANEOUS | Status: DC

## 2011-06-30 NOTE — Discharge Summary (Signed)
HOSPITAL DISCHARGE SUMMARY  Mary [name]  MRN: [id number]  DOB:01/08/1971  Date of Admission: 06/28/2011 Date of Discharge: 06/30/2011         LOS: 2 days   Attending Physician:Estevan Kersh A  Patient's WUJ:WJXBJY,NWGNFA, MD, MD  Consults:   None  Discharge Diagnoses: Present on Admission:  .DKA (diabetic ketoacidoses) .Bipolar 2 disorder .Cirrhosis of liver .Bipolar 1 disorder   Current Discharge Medication List    START taking these medications   Details  insulin glargine (LANTUS) 100 UNIT/ML injection Inject 35 Units into the skin at bedtime. Qty: 10 mL, Refills: 0      CONTINUE these medications which have CHANGED   Details  insulin aspart (NOVOLOG) 100 UNIT/ML injection Inject 6 Units into the skin 3 (three) times daily with meals. Qty: 1 vial, Refills: 0      CONTINUE these medications which have NOT CHANGED   Details  aspirin EC 81 MG tablet Take 81 mg by mouth daily.     lithium 300 MG tablet Take 600 mg by mouth at bedtime.     magnesium gluconate (MAGONATE) 500 MG tablet Take 500 mg by mouth 2 (two) times daily.      nicotine (NICOTROL) 10 MG inhaler Inhale 1 puff into the lungs as needed for smoking cessation. Qty: 1 each, Refills: 6    pantoprazole (PROTONIX) 40 MG tablet Take 1 tablet (40 mg total) by mouth daily. Qty: 30 tablet, Refills: 10    QUEtiapine (SEROQUEL) 50 MG tablet Take 50 mg by mouth at bedtime.        STOP taking these medications     ibuprofen (ADVIL,MOTRIN) 200 MG tablet      insulin NPH-insulin regular (NOVOLIN 70/30) (70-30) 100 UNIT/ML injection          Brief Admission History: Mary [name] is a 41 y.o. female with past medical history of type 1 diabetes mellitus, bipolar affective disorder and hepatitis C. Patient came into the hospital complaining about abdominal pain. Patient was in her usual state of health till day before yesterday when she started to have epigastric pain. This was quickly followed by nausea  and then she started to vomit. The vomiting was so severe so she came in to the hospital for further evaluation. Upon initial evaluation in the emergency department, her bicarbonate is 14. And she was found to have blood glucose of 589. Patient denies any cough denies any burning while passing urine she'll be admitted to the hospital for further evaluation she started already in glucose drip.  Hospital Course: Present on Admission:  .DKA (diabetic ketoacidoses) .Bipolar 2 disorder .Cirrhosis of liver .Bipolar 1 disorder  1. DKA: Patient admitted to the hospital. Initially she was placed is n.p.o. Started on insulin drip using glucose stabilizer protocol. Her electrolytes and renal function was checked every 8 hours. Her potassium was repleted aggressively while she is getting the IV insulin. Patient also got hydrated aggressively with IV fluids she had over 4 L bolus in the emergency department. And then she had continuous IV fluids. These treatments were continued till her anion gap is closed. And her bicarbonate went up to 21 she was officially not acidotic. Then the IV insulin drip was discontinued patient started on Lantus insulin.  2. IDDM: Patient diabetes is uncontrolled. Her hemoglobin A1c is 13.8. Previously it was 14.3. Patient is very confused about her insulin regimen. She was discharged last time on Lantus and she was thinking she still using the 70/30 mix. Her regimen  was straightened out. She will be on 35 units of Lantus insulin and 6 units with meals of NovoLog. Patient will followup with her primary care physician Dr. Georganna Skeans in the morning. The insulin pens from the hospital here will be given to her.  3. Bipolar disorder: No changes done to her medication regimen.  4. Cirrhosis of the liver: This is seems to be nonproblematic during this stay, patient does not have any signs of decompensation.   Day of Discharge BP 111/74  Pulse 68  Temp(Src) 98.3 F (36.8 C) (Oral)   Resp 16  Ht 5\' 4"  (1.626 m)  Wt 51.665 kg (113 lb 14.4 oz)  BMI 19.55 kg/m2  SpO2 100%  LMP 06/29/2011 Physical Exam: GEN: No acute distress, cooperative with exam PSYCH: He is alert and oriented x4; does not appear anxious does not appear depressed; affect is normal  HEENT: Mucous membranes pink and anicteric;  Mouth: without oral thrush or lesions Eyes: PERRLA; EOM intact;  Neck: no cervical lymphadenopathy nor thyromegaly or carotid bruit; no JVD;  CHEST WALL: No tenderness, symmetrical to breathing bilaterally CHEST: Normal respiration, clear to auscultation bilaterally  HEART: Regular rate and rhythm; no murmurs, rubs or gallops, S1 and S2 heard  BACK: No kyphosis or scoliosis; no CVA tenderness  ABDOMEN:  soft non-tender; no masses, no organomegaly, normal abdominal bowel sounds; no pannus; no intertriginous candida.  EXTREMITIES: No bone or joint deformity; no edema; no ulcerations.  PULSES: 2+ and symmetric, neurovascularity is intact SKIN: Normal hydration no rash or ulceration, no flushing or suspicious lesions  CNS: Cranial nerves 2-12 grossly intact no focal neurologic deficit, coordination is intact gait not tested    Results for orders placed during the hospital encounter of 06/28/11 (from the past 24 hour(s))  GLUCOSE, CAPILLARY     Status: Abnormal   Collection Time   06/29/11 11:41 AM      Component Value Range   Glucose-Capillary 112 (*) 70 - 99 (mg/dL)  GLUCOSE, CAPILLARY     Status: Abnormal   Collection Time   06/29/11  4:10 PM      Component Value Range   Glucose-Capillary 326 (*) 70 - 99 (mg/dL)  BASIC METABOLIC PANEL     Status: Abnormal   Collection Time   06/29/11  6:05 PM      Component Value Range   Sodium 132 (*) 135 - 145 (mEq/L)   Potassium 4.8  3.5 - 5.1 (mEq/L)   Chloride 104  96 - 112 (mEq/L)   CO2 18 (*) 19 - 32 (mEq/L)   Glucose, Bld 330 (*) 70 - 99 (mg/dL)   BUN 12  6 - 23 (mg/dL)   Creatinine, Ser 1.32  0.50 - 1.10 (mg/dL)   Calcium  8.7  8.4 - 10.5 (mg/dL)   GFR calc non Af Amer >90  >90 (mL/min)   GFR calc Af Amer >90  >90 (mL/min)  GLUCOSE, CAPILLARY     Status: Abnormal   Collection Time   06/29/11  9:06 PM      Component Value Range   Glucose-Capillary 158 (*) 70 - 99 (mg/dL)   Comment 1 Notify RN     Comment 2 Documented in Chart    GLUCOSE, CAPILLARY     Status: Abnormal   Collection Time   06/30/11  7:39 AM      Component Value Range   Glucose-Capillary 284 (*) 70 - 99 (mg/dL)  BASIC METABOLIC PANEL     Status: Abnormal  Collection Time   06/30/11  9:45 AM      Component Value Range   Sodium 134 (*) 135 - 145 (mEq/L)   Potassium 4.3  3.5 - 5.1 (mEq/L)   Chloride 106  96 - 112 (mEq/L)   CO2 17 (*) 19 - 32 (mEq/L)   Glucose, Bld 313 (*) 70 - 99 (mg/dL)   BUN 11  6 - 23 (mg/dL)   Creatinine, Ser 5.28  0.50 - 1.10 (mg/dL)   Calcium 8.6  8.4 - 41.3 (mg/dL)   GFR calc non Af Amer >90  >90 (mL/min)   GFR calc Af Amer >90  >90 (mL/min)  CBC     Status: Abnormal   Collection Time   06/30/11  9:45 AM      Component Value Range   WBC 9.3  4.0 - 10.5 (K/uL)   RBC 4.52  3.87 - 5.11 (MIL/uL)   Hemoglobin 10.9 (*) 12.0 - 15.0 (g/dL)   HCT 24.4 (*) 01.0 - 46.0 (%)   MCV 75.9 (*) 78.0 - 100.0 (fL)   MCH 24.1 (*) 26.0 - 34.0 (pg)   MCHC 31.8  30.0 - 36.0 (g/dL)   RDW 27.2  53.6 - 64.4 (%)   Platelets 209  150 - 400 (K/uL)    Disposition:  home   Follow-up Appts: Discharge Orders    Future Orders Please Complete By Expires   Diet Carb Modified      Increase activity slowly         Follow-up Information    Follow up with Georganna Skeans, MD on 07/01/2011.   Contact information:   [address] [place], [place] Tel: [phone]         I spent 40 minutes completing paperwork and coordinating discharge efforts.  SignedClydia Llano A 06/30/2011, 10:38 AM

## 2011-07-01 NOTE — Progress Notes (Signed)
UR completed 

## 2011-07-11 ENCOUNTER — Inpatient Hospital Stay (HOSPITAL_COMMUNITY): Payer: Medicaid Other

## 2011-07-11 ENCOUNTER — Inpatient Hospital Stay (HOSPITAL_COMMUNITY)
Admission: EM | Admit: 2011-07-11 | Discharge: 2011-07-13 | DRG: 639 | Disposition: A | Discharge status: Home / Self Care | Payer: Medicaid Other | Attending: Internal Medicine | Admitting: Internal Medicine

## 2011-07-11 ENCOUNTER — Other Ambulatory Visit: Payer: Self-pay

## 2011-07-11 ENCOUNTER — Encounter (HOSPITAL_COMMUNITY): Payer: Self-pay | Admitting: Emergency Medicine

## 2011-07-11 DIAGNOSIS — D72829 Elevated white blood cell count, unspecified: Secondary | ICD-10-CM | POA: Diagnosis present

## 2011-07-11 DIAGNOSIS — E876 Hypokalemia: Secondary | ICD-10-CM

## 2011-07-11 DIAGNOSIS — R109 Unspecified abdominal pain: Secondary | ICD-10-CM | POA: Diagnosis present

## 2011-07-11 DIAGNOSIS — E860 Dehydration: Secondary | ICD-10-CM | POA: Diagnosis present

## 2011-07-11 DIAGNOSIS — E119 Type 2 diabetes mellitus without complications: Secondary | ICD-10-CM

## 2011-07-11 DIAGNOSIS — R8271 Bacteriuria: Secondary | ICD-10-CM

## 2011-07-11 DIAGNOSIS — F1210 Cannabis abuse, uncomplicated: Secondary | ICD-10-CM | POA: Diagnosis present

## 2011-07-11 DIAGNOSIS — B182 Chronic viral hepatitis C: Secondary | ICD-10-CM

## 2011-07-11 DIAGNOSIS — Z720 Tobacco use: Secondary | ICD-10-CM

## 2011-07-11 DIAGNOSIS — Z794 Long term (current) use of insulin: Secondary | ICD-10-CM

## 2011-07-11 DIAGNOSIS — Z9119 Patient's noncompliance with other medical treatment and regimen: Secondary | ICD-10-CM | POA: Diagnosis not present

## 2011-07-11 DIAGNOSIS — E1110 Type 2 diabetes mellitus with ketoacidosis without coma: Secondary | ICD-10-CM

## 2011-07-11 DIAGNOSIS — Z9114 Patient's other noncompliance with medication regimen: Secondary | ICD-10-CM

## 2011-07-11 DIAGNOSIS — F17200 Nicotine dependence, unspecified, uncomplicated: Secondary | ICD-10-CM | POA: Diagnosis present

## 2011-07-11 DIAGNOSIS — F319 Bipolar disorder, unspecified: Secondary | ICD-10-CM | POA: Diagnosis present

## 2011-07-11 DIAGNOSIS — G40909 Epilepsy, unspecified, not intractable, without status epilepticus: Secondary | ICD-10-CM | POA: Diagnosis present

## 2011-07-11 DIAGNOSIS — D509 Iron deficiency anemia, unspecified: Secondary | ICD-10-CM

## 2011-07-11 DIAGNOSIS — K7460 Unspecified cirrhosis of liver: Secondary | ICD-10-CM | POA: Diagnosis present

## 2011-07-11 DIAGNOSIS — Z91199 Patient's noncompliance with other medical treatment and regimen due to unspecified reason: Secondary | ICD-10-CM

## 2011-07-11 DIAGNOSIS — E1010 Type 1 diabetes mellitus with ketoacidosis without coma: Secondary | ICD-10-CM | POA: Diagnosis present

## 2011-07-11 DIAGNOSIS — E1165 Type 2 diabetes mellitus with hyperglycemia: Secondary | ICD-10-CM

## 2011-07-11 DIAGNOSIS — F3181 Bipolar II disorder: Secondary | ICD-10-CM

## 2011-07-11 LAB — CBC
HCT: 46 % (ref 36.0–46.0)
Hemoglobin: 14.7 g/dL (ref 12.0–15.0)
MCH: 24.3 pg — ABNORMAL LOW (ref 26.0–34.0)
MCHC: 32 g/dL (ref 30.0–36.0)
MCV: 76.2 fL — ABNORMAL LOW (ref 78.0–100.0)
Platelets: 366 10*3/uL (ref 150–400)
RBC: 6.04 MIL/uL — ABNORMAL HIGH (ref 3.87–5.11)
RDW: 15 % (ref 11.5–15.5)
WBC: 17.2 10*3/uL — ABNORMAL HIGH (ref 4.0–10.5)

## 2011-07-11 LAB — URINALYSIS, ROUTINE W REFLEX MICROSCOPIC
Bilirubin Urine: NEGATIVE
Glucose, UA: >1000 mg/dL — AB
Ketones, ur: >80 mg/dL — AB
Leukocytes, UA: NEGATIVE
Nitrite: NEGATIVE
Protein, ur: NEGATIVE mg/dL
Specific Gravity, Urine: 1.03 (ref 1.005–1.030)
Urobilinogen, UA: 0.2 mg/dL (ref 0.0–1.0)
pH: 5 (ref 5.0–8.0)

## 2011-07-11 LAB — BASIC METABOLIC PANEL
BUN: 23 mg/dL (ref 6–23)
CO2: 14 mEq/L — ABNORMAL LOW (ref 19–32)
Calcium: 11.2 mg/dL — ABNORMAL HIGH (ref 8.4–10.5)
Chloride: 90 mEq/L — ABNORMAL LOW (ref 96–112)
Creatinine, Ser: 0.69 mg/dL (ref 0.50–1.10)
GFR calc Af Amer: >90 mL/min (ref >90)
GFR calc non Af Amer: >90 mL/min (ref >90)
Glucose, Bld: 621 mg/dL (ref 70–99)
Potassium: 4.9 mEq/L (ref 3.5–5.1)
Sodium: 139 mEq/L (ref 135–145)

## 2011-07-11 LAB — BLOOD GAS, VENOUS
Acid-base deficit: 13.7 mmol/L — ABNORMAL HIGH (ref 0.0–2.0)
Bicarbonate: 15.1 mEq/L — ABNORMAL LOW (ref 20.0–24.0)
O2 Saturation: 67.2 %
Patient temperature: 98.6
TCO2: 14.3 mmol/L (ref 0–100)
pCO2, Ven: 45.9 mmHg (ref 45.0–50.0)
pH, Ven: 7.144 — CL (ref 7.250–7.300)
pO2, Ven: 44.9 mmHg (ref 30.0–45.0)

## 2011-07-11 LAB — GLUCOSE, CAPILLARY
Glucose-Capillary: 390 mg/dL — ABNORMAL HIGH (ref 70–99)
Glucose-Capillary: 305 mg/dL — ABNORMAL HIGH (ref 70–99)
Glucose-Capillary: 172 mg/dL — ABNORMAL HIGH (ref 70–99)
Glucose-Capillary: 125 mg/dL — ABNORMAL HIGH (ref 70–99)

## 2011-07-11 LAB — MAGNESIUM: Magnesium: 2.6 mg/dL — ABNORMAL HIGH (ref 1.5–2.5)

## 2011-07-11 LAB — URINE MICROSCOPIC-ADD ON

## 2011-07-11 LAB — PREGNANCY, URINE: Preg Test, Ur: NEGATIVE

## 2011-07-11 MED ORDER — PANTOPRAZOLE SODIUM 40 MG IV SOLR
40.0000 mg | Freq: Two times a day (BID) | INTRAVENOUS | Status: DC
  Administered 2011-07-11 – 2011-07-12 (×3): 40 mg via INTRAVENOUS
  Filled 2011-07-11 (×5): qty 40

## 2011-07-11 MED ORDER — MORPHINE SULFATE 2 MG/ML IJ SOLN
0.5000 mg | INTRAMUSCULAR | Status: DC | PRN
  Administered 2011-07-11: 1 mg via INTRAVENOUS
  Filled 2011-07-11: qty 1

## 2011-07-11 MED ORDER — SODIUM CHLORIDE 0.9 % IJ SOLN: 10.0000 mL | INTRAMUSCULAR | Status: DC | PRN

## 2011-07-11 MED ORDER — SODIUM CHLORIDE 0.9 % IJ SOLN
10.0000 mL | Freq: Two times a day (BID) | INTRAMUSCULAR | Status: DC
  Administered 2011-07-11 – 2011-07-13 (×4): 10 mL

## 2011-07-11 MED ORDER — LITHIUM CARBONATE 300 MG PO CAPS
600.0000 mg | ORAL_CAPSULE | Freq: Every day | ORAL | Status: DC
  Administered 2011-07-11 – 2011-07-12 (×2): 600 mg via ORAL
  Filled 2011-07-11 (×4): qty 2

## 2011-07-11 MED ORDER — ONDANSETRON HCL 4 MG/2ML IJ SOLN
4.0000 mg | Freq: Four times a day (QID) | INTRAMUSCULAR | Status: DC | PRN
  Administered 2011-07-11: 4 mg via INTRAVENOUS
  Filled 2011-07-11: qty 2

## 2011-07-11 MED ORDER — SODIUM CHLORIDE 0.9 % IV BOLUS (SEPSIS)
2000.0000 mL | Freq: Once | INTRAVENOUS | Status: AC
  Administered 2011-07-11: 2000 mL via INTRAVENOUS

## 2011-07-11 MED ORDER — DEXTROSE 50 % IV SOLN: 25.0000 mL | INTRAVENOUS | Status: DC | PRN

## 2011-07-11 MED ORDER — SODIUM CHLORIDE 0.9 % IV SOLN: INTRAVENOUS | Status: DC

## 2011-07-11 MED ORDER — POTASSIUM CHLORIDE 10 MEQ/100ML IV SOLN
10.0000 meq | Freq: Once | INTRAVENOUS | Status: AC
  Administered 2011-07-11: 10 meq via INTRAVENOUS
  Filled 2011-07-11: qty 100

## 2011-07-11 MED ORDER — HYDROMORPHONE HCL PF 1 MG/ML IJ SOLN
1.0000 mg | Freq: Once | INTRAMUSCULAR | Status: AC
  Administered 2011-07-11: 1 mg via INTRAVENOUS
  Filled 2011-07-11: qty 1

## 2011-07-11 MED ORDER — ASPIRIN EC 81 MG PO TBEC
81.0000 mg | DELAYED_RELEASE_TABLET | Freq: Every day | ORAL | Status: DC
  Administered 2011-07-11 – 2011-07-13 (×3): 81 mg via ORAL
  Filled 2011-07-11 (×3): qty 1

## 2011-07-11 MED ORDER — SODIUM CHLORIDE 0.9 % IV SOLN
INTRAVENOUS | Status: DC
  Administered 2011-07-11: 10:00:00 via INTRAVENOUS

## 2011-07-11 MED ORDER — ONDANSETRON 8 MG/NS 50 ML IVPB
8.0000 mg | Freq: Once | INTRAVENOUS | Status: AC
  Administered 2011-07-11: 8 mg via INTRAVENOUS
  Filled 2011-07-11 (×2): qty 8

## 2011-07-11 MED ORDER — ACETAMINOPHEN 325 MG PO TABS: 650.0000 mg | ORAL_TABLET | Freq: Four times a day (QID) | ORAL | Status: DC | PRN

## 2011-07-11 MED ORDER — ACETAMINOPHEN 650 MG RE SUPP: 650.0000 mg | Freq: Four times a day (QID) | RECTAL | Status: DC | PRN

## 2011-07-11 MED ORDER — ENOXAPARIN SODIUM 40 MG/0.4ML ~~LOC~~ SOLN
40.0000 mg | SUBCUTANEOUS | Status: DC
  Administered 2011-07-11 – 2011-07-12 (×2): 40 mg via SUBCUTANEOUS
  Filled 2011-07-11 (×3): qty 0.4

## 2011-07-11 MED ORDER — GUAIFENESIN-DM 100-10 MG/5ML PO SYRP: 5.0000 mL | ORAL_SOLUTION | ORAL | Status: DC | PRN

## 2011-07-11 MED ORDER — QUETIAPINE FUMARATE 50 MG PO TABS
50.0000 mg | ORAL_TABLET | Freq: Every day | ORAL | Status: DC
  Administered 2011-07-11 – 2011-07-12 (×2): 50 mg via ORAL
  Filled 2011-07-11 (×3): qty 1

## 2011-07-11 MED ORDER — SODIUM CHLORIDE 0.9 % IV SOLN
INTRAVENOUS | Status: DC
  Administered 2011-07-11: 5 [IU]/h via INTRAVENOUS
  Administered 2011-07-11: 3.4 [IU]/h via INTRAVENOUS
  Administered 2011-07-12: 1.1 [IU]/h via INTRAVENOUS
  Administered 2011-07-12: 2.8 [IU]/h via INTRAVENOUS
  Administered 2011-07-12: 08:00:00 via INTRAVENOUS
  Filled 2011-07-11: qty 1

## 2011-07-11 MED ORDER — ONDANSETRON HCL 4 MG PO TABS: 4.0000 mg | ORAL_TABLET | Freq: Four times a day (QID) | ORAL | Status: DC | PRN

## 2011-07-11 MED ORDER — SODIUM CHLORIDE 0.9 % IV SOLN
INTRAVENOUS | Status: DC
  Administered 2011-07-11: 5.6 [IU]/h via INTRAVENOUS
  Filled 2011-07-11: qty 1

## 2011-07-11 MED ORDER — DEXTROSE-NACL 5-0.45 % IV SOLN
INTRAVENOUS | Status: DC
  Administered 2011-07-11 – 2011-07-12 (×2): via INTRAVENOUS

## 2011-07-11 MED ORDER — BISACODYL 5 MG PO TBEC: 5.0000 mg | DELAYED_RELEASE_TABLET | Freq: Every day | ORAL | Status: DC | PRN

## 2011-07-11 MED ORDER — INSULIN REGULAR BOLUS VIA INFUSION
0.0000 [IU] | Freq: Three times a day (TID) | INTRAVENOUS | Status: DC
  Filled 2011-07-11 (×4): qty 10

## 2011-07-11 NOTE — ED Notes (Signed)
CBG- 281 

## 2011-07-11 NOTE — ED Notes (Signed)
Dr. Juleen China notified of need for PICC line. IV therapy notified.

## 2011-07-11 NOTE — ED Provider Notes (Addendum)
History    41 year old female with abdominal pain and vomiting. Patient states "I'm in DKA." Gradual onset yesterday. Progressively worsening. Abdominal pain is diffuse and crampy. No radiation. No fever or chills. No urinary complaints. Denies vaginal discharge or unusual bleeding. Patient with very poorly controlled diabetes and multiple ER evaluations and admissions for diabetic ketoacidosis.  CSN: [id number]  Arrival date & time 07/11/11  1478   First MD Initiated Contact with Patient 07/11/11 [phone]      Chief Complaint  Patient presents with  . Vomiting, hyperglycemia     (Consider location/radiation/quality/duration/timing/severity/associated sxs/prior treatment) HPI  Past Medical History  Diagnosis Date  . Diabetes mellitus     diagnosed in 1996-always been on insulin  . Hep C w/o coma, chronic     biopsy in 2010-no rx-was supposed to see a hepatologist in [place]  . Kidney stone   . Seizure disorder     started with pregnancy of first son  . Gastritis   . Bipolar 2 disorder   . Post traumatic stress disorder     "flipping out" after people close to her died  . DKA (diabetic ketoacidoses)     Recurrent admissions for DKA, medication non-complaince, poor social situation  . Cirrhosis   . Angina   . Heart murmur   . Shortness of breath     occassionally  . Seizures     pt states 10yrs ago  . Headache     occassionally  . Arthritis     r ankle-S/P surgery 2005-11-21)  . Anxiety   . Peripheral vascular disease     numbness bilaterally feet  . Anemia     childhood  . H/O hiatal hernia     2 yrs ago  . Depression     childhood    Past Surgical History  Procedure Date  . Ankle surgery   . Cesarean section     Family History  Problem Relation Age of Onset  . Diabetes Mother     currently 35  . Fibromyalgia Mother   . Cirrhosis Father     died in 11/21/97    History  Substance Use Topics  . Smoking status: Current Everyday Smoker -- 0.2 packs/day for  25 years  . Smokeless tobacco: Never Used  . Alcohol Use: No     Endorses hasn't been drinking    OB History    Grav Para Term Preterm Abortions TAB SAB Ect Mult Living                  Review of Systems   Review of symptoms negative unless otherwise noted in HPI.   Allergies  Penicillins  Home Medications   Current Outpatient Rx  Name Route Sig Dispense Refill  . ASPIRIN EC 81 MG PO TBEC Oral Take 81 mg by mouth daily.     . INSULIN ASPART 100 UNIT/ML [place] SOLN Subcutaneous Inject 6 Units into the skin 3 (three) times daily with meals. 1 vial 0  . INSULIN GLARGINE 100 UNIT/ML [place] SOLN Subcutaneous Inject 35 Units into the skin at bedtime. 10 mL 0  . LITHIUM CARBONATE 300 MG PO TABS Oral Take 600 mg by mouth at bedtime.     Marland Kitchen MAGNESIUM GLUCONATE 500 MG PO TABS Oral Take 500 mg by mouth 2 (two) times daily.      Marland Kitchen PANTOPRAZOLE SODIUM 40 MG PO TBEC Oral Take 1 tablet (40 mg total) by mouth daily. 30 tablet 10  . QUETIAPINE FUMARATE  50 MG PO TABS Oral Take 50 mg by mouth at bedtime.        BP 122/95  Pulse 113  Temp(Src) 97.9 F (36.6 C) (Oral)  Resp 24  Ht 5\' 4"  (1.626 m)  Wt 110 lb (49.896 kg)  BMI 18.88 kg/m2  SpO2 96%  LMP 06/29/2011  Physical Exam  Nursing note and vitals reviewed. Constitutional:       Laying in bed on her side. Thin. Appears uncomfortable.  HENT:  Head: Normocephalic and atraumatic.       Dry mucus membranes.  Eyes: Conjunctivae are normal. Pupils are equal, round, and reactive to light. Right eye exhibits no discharge. Left eye exhibits no discharge.  Neck: Neck supple.  Cardiovascular: Regular rhythm and normal heart sounds.  Exam reveals no gallop and no friction rub.   No murmur heard.      Tachycardic  Pulmonary/Chest: Effort normal and breath sounds normal. No respiratory distress.  Abdominal: Soft.       Diffusely tender. No guarding or rebound. Soft.  Genitourinary:       No CVA tenderness  Musculoskeletal: She exhibits no edema  and no tenderness.  Neurological: She is alert.  Skin: Skin is warm and dry.       Track marks bilateral forearms  Psychiatric: Thought content normal.    ED Course  Angiocath insertion Date/Time: 07/11/2011 1:40 PM Performed by: Raeford Razor Authorized by: Raeford Razor Consent: Verbal consent obtained. Risks and benefits: risks, benefits and alternatives were discussed Patient identity confirmed: verbally with patient, arm band and provided demographic data Preparation: Patient was prepped and draped in the usual sterile fashion. Local anesthesia used: no Patient sedated: no Patient tolerance: Patient tolerated the procedure well with no immediate complications. Comments: 20g angiocath in R external jugular vein. Pt tolerated procedure well with no immediate complications.   (including critical care time)  CRITICAL CARE Performed by: Raeford Razor   Total critical care time: 35 minutes  Critical care time was exclusive of separately billable procedures and treating other patients.  Critical care was necessary to treat or prevent imminent or life-threatening deterioration.  Critical care was time spent personally by me on the following activities: development of treatment plan with patient and/or surrogate as well as nursing, discussions with consultants, evaluation of patient's response to treatment, examination of patient, obtaining history from patient or surrogate, ordering and performing treatments and interventions, ordering and review of laboratory studies, ordering and review of radiographic studies, pulse oximetry and re-evaluation of patient's condition.   Labs Reviewed  GLUCOSE, CAPILLARY - Abnormal; Notable for the following:    Glucose-Capillary 557 (*)    All other components within normal limits  CBC - Abnormal; Notable for the following:    WBC 17.2 (*)    RBC 6.04 (*)    MCV 76.2 (*)    MCH 24.3 (*)    All other components within normal limits  BASIC  METABOLIC PANEL - Abnormal; Notable for the following:    Chloride 90 (*)    CO2 14 (*)    Glucose, Bld 621 (*)    Calcium 11.2 (*)    All other components within normal limits  MAGNESIUM - Abnormal; Notable for the following:    Magnesium 2.6 (*)    All other components within normal limits  BLOOD GAS, VENOUS - Abnormal; Notable for the following:    pH, Ven 7.144 (*)    Bicarbonate 15.1 (*)    Acid-base deficit 13.7 (*)  All other components within normal limits  URINALYSIS, ROUTINE W REFLEX MICROSCOPIC  BLOOD GAS, VENOUS  PREGNANCY, URINE   No results found.    EKG:  Rhythm: sinus tachycardia Rate: 106 Axis: normal Intervals: normal ST segments: Nonspecific ST changes.T wave flattening in lead III.  1. Diabetic ketoacidosis   2. Dehydration   3. Uncontrolled diabetes mellitus       MDM  41 year old female with abdominal pain and nausea/vomiting. Clinical picture and labs consistent of diabetic ketoacidosis. The pH is 7.14, bicarb 14. Insulin drip was initiated. IV fluid bolus. Will require admission for further treatment. Leukocytosis noted but suspect this is from hemoconcentration and stress demargination. Consider infection but suspect more likely DKA result of poor medication compliance.        Raeford Razor, MD 07/11/11 4098  Raeford Razor, MD 07/11/11 0940  1330: Patient lost her peripheral IV access. Multiple attempts by nursing unsuccessful in placing a new one. A right external jugular peripheral line was started 20-gauge.  Raeford Razor, MD 07/11/11 1340

## 2011-07-11 NOTE — ED Notes (Signed)
Notified dr.kohuy in ref critical bs level of 621.

## 2011-07-11 NOTE — H&P (Signed)
PCP:   Georganna Skeans, MD, MD   Chief Complaint:  Nausea vomiting and abdominal pain  HPI: The patient is a 41 year old black female with past medical history significant for diabetes mellitus and  noncompliance with medication regimen, and multiple admissions for DKA in the last discharged from the hospital 06/28/2011 following treatment for same who presents with above complaints. She states that she ran out of her Lantus about 2 days ago although she insists that she's been taking her NovoLog. Yesterday she developed nausea vomiting along with abdominal pain. She describes and abdominal pain as sharp, diffuse, intermittent lasting about 20 minutes at that time. She reports that she also began having nausea vomiting-has had multiple episodes are, nonbloody. She denies diarrhea. Patient also denies of fevers, dysuria, chest pain, shortness of breath ,melena and no hematochezia. She admits to a nonproductive cough. She was seen in the ED and blood sugars noted to be in the 600 range, with a CO2 of 14, white cell count was noted to be elevated at 17. Urinalysis and negative for infection. She was started IV insulin and is admitted to the hospitalist service for further evaluation and management.   Review of Systems:  The patient denies , fever, weight loss,, vision loss, decreased hearing, hoarseness, chest pain, syncope, dyspnea on exertion, peripheral edema, balance deficits, hemoptysis, , melena, hematochezia, hematuria, incontinence, muscle weakness, suspicious skin lesions, transient blindness, difficulty walking, depression, unusual weight change, abnormal bleeding.  Past Medical History: Past Medical History  Diagnosis Date  . Diabetes mellitus     diagnosed in 1996-always been on insulin  . Hep C w/o coma, chronic     biopsy in 2010-no rx-was supposed to see a hepatologist in [place]  . Kidney stone   . Seizure disorder     started with pregnancy of first son  . Gastritis   .  Bipolar 2 disorder   . Post traumatic stress disorder     "flipping out" after people close to her died  . DKA (diabetic ketoacidoses)     Recurrent admissions for DKA, medication non-complaince, poor social situation  . Cirrhosis   . Angina   . Heart murmur   . Shortness of breath     occassionally  . Seizures     pt states 18yrs ago  . Headache     occassionally  . Arthritis     r ankle-S/P surgery (2007)  . Anxiety   . Peripheral vascular disease     numbness bilaterally feet  . Anemia     childhood  . H/O hiatal hernia     2 yrs ago  . Depression     childhood   Past Surgical History  Procedure Date  . Ankle surgery   . Cesarean section     Medications: Prior to Admission medications   Medication Sig Start Date End Date Taking? Authorizing Provider  aspirin EC 81 MG tablet Take 81 mg by mouth daily.     Historical Provider, MD  insulin aspart (NOVOLOG) 100 UNIT/ML injection Inject 6 Units into the skin 3 (three) times daily with meals. 06/30/11 06/29/12  Mutaz A Elmahi, MD  insulin glargine (LANTUS) 100 UNIT/ML injection Inject 35 Units into the skin at bedtime. 06/30/11 06/29/12  Clint Lipps, MD  lithium 300 MG tablet Take 600 mg by mouth at bedtime.     Historical Provider, MD  magnesium gluconate (MAGONATE) 500 MG tablet Take 500 mg by mouth 2 (two) times daily.  Historical Provider, MD  pantoprazole (PROTONIX) 40 MG tablet Take 1 tablet (40 mg total) by mouth daily. 06/05/11   Hillery Aldo, MD  QUEtiapine (SEROQUEL) 50 MG tablet Take 50 mg by mouth at bedtime.      Historical Provider, MD    Allergies:   Allergies  Allergen Reactions  . Penicillins Rash    Social History: She smokes about 2-3 cigarettes a day. Family History: Family History  Problem Relation Age of Onset  . Diabetes Mother     currently 56  . Fibromyalgia Mother   . Cirrhosis Father     died in 12-04-1997    Physical Exam: Filed Vitals:   07/11/11 0638  BP: 122/95  Pulse: 113    Temp: 97.9 F (36.6 C)  TempSrc: Oral  Resp: 24  Height: 5\' 4"  (1.626 m)  Weight: 49.896 kg (110 lb)  SpO2: 96%   Constitutional: Vital signs reviewed.  Thin appearing middle aged black female  in  moderate distress secondary to  and cooperative with exam. Alert and oriented x3.  Head: Normocephalic and atraumatic Mouth: no erythema or exudates, MMM Eyes: PERRL, EOMI, conjunctivae normal, No scleral icterus.  Neck: Supple, Trachea midline normal ROM, No JVD, mass, thyromegaly, or carotid bruit present.  Cardiovascular: RRR, S1 normal, S2 normal, no MRG, pulses symmetric and intact bilaterally Pulmonary/Chest: CTAB, no wheezes, rales, or rhonchi Abdominal: Soft. Non-tender,  mild diffuse tenderness, no rebound. , bowel sounds are normal, no masses, organomegaly, or guarding present.  GU: no CVA tenderness  extremities : no cyanosis and no edema  Neurological: A&O x3, Strenght is normal and symmetric bilaterally, cranial nerve II-XII are grossly intact, no focal motor deficit, sensory intact to light touch bilaterally.      Labs on Admission:   [facility] 07/11/11 0810  NA 139  K 4.9  CL 90*  CO2 14*  GLUCOSE 621*  BUN 23  CREATININE 0.69  CALCIUM 11.2*  MG 2.6*  PHOS --   No results found for this basename: AST:2,ALT:2,ALKPHOS:2,BILITOT:2,PROT:2,ALBUMIN:2 in the last 72 hours No results found for this basename: LIPASE:2,AMYLASE:2 in the last 72 hours  Basename 07/11/11 0810  WBC 17.2*  NEUTROABS --  HGB 14.7  HCT 46.0  MCV 76.2*  PLT 366   No results found for this basename: CKTOTAL:3,CKMB:3,CKMBINDEX:3,TROPONINI:3 in the last 72 hours No results found for this basename: TSH,T4TOTAL,FREET3,T3FREE,THYROIDAB in the last 72 hours No results found for this basename: VITAMINB12:2,FOLATE:2,FERRITIN:2,TIBC:2,IRON:2,RETICCTPCT:2 in the last 72 hours  Radiological Exams on Admission: No results found.  Assessment/Plan Present on Admission:  .DKA (diabetic  ketoacidoses) -As discussed above, most likely secondary to noncompliance, and volume depletion.we'll continue IV insulin via glucose nebulizer. Monitor Accu-Cheks and transitioned to Lantus when she meets the criteria.  .Abdominal pain -Will obtain a lipase, urinalysis is negative for infection. Also obtain abdominal series and follow.  .Leukocytosis -UA negative for infection as above, obtain chest x-ray and follow. Possibly secondary to stress demargination.  .DM (diabetes mellitus) -As above, will have diabetes inpatient teaching and continue to emphasize importance of her medical compliance.  .Bipolar disorder -Continue outpatient medications.  .H/OSeizure disorder -On no medications, follow  .Tobacco abuse .Noncompliance with medication regimen .Volume depletion/dehydration-hydrate follow and recheck. .Hypercalcemia- hydrate follow and recheck, if remaining elevated will further evaluate as appropriate. -Possibly secondary to volume depletion/dehydration  .History of cirrhosis -monitor of fluid status, recheck LFTs.  Vivian Neuwirth C 07/11/2011, 10:57 AM    Lindalee Huizinga C 07/11/2011, 10:54 AM

## 2011-07-11 NOTE — ED Notes (Signed)
Insulin drip resumed at 5.6 units/hr

## 2011-07-11 NOTE — ED Notes (Signed)
Iv team in room, ultra sound machine taken to room for assit

## 2011-07-11 NOTE — ED Notes (Signed)
Unable to obtain IV access x 2 attempts. IV team paged.

## 2011-07-11 NOTE — ED Notes (Signed)
Iv gtt stopped at this time due to no iv access, attempts for access being remade, hot compress on old iv site.

## 2011-07-11 NOTE — Progress Notes (Signed)
ED CM spoke with patricia at [facility]. Pt has an already scheduled appointment for a "nurse visit" on Monday, January 28., 2013.  May need appointment rescheduling if pt remains hospitalized.

## 2011-07-11 NOTE — Progress Notes (Signed)
ED CM noted admission RN CM consult in EPIC.  Spoke with pt who confirms she is still self pay [employer] pt seeing Dr a [employer] loss adjuster [facility].  Seen last on [facility] 14th [facility] 15th [facility] and has an appointment at the end of January. Pt voicing she can not afford her co pays to provider nor for medications.  Reminded pt of [facility] and [facility] for lower medication costs.  Reminded pt of financial resources (churches, Holiday representative, goodwill, family, etc) Encouraged DSS medicaid application.  Cm spoke with Marchelle Folks in [place] [facility] to confirm pt is not eligible for indigent medication assistance Last assistance provided on 03/15/11 (not eligible until 03/14/12).  Reviewed EPIC list of medications prior to admission with pt.  She states she gets seroquel and lithium assistance from [facility] (has missed last few appointment due to 06/2011 hospitalizations/ED visits-cost of each med is $5 at [employer]).  She confirms she is out of lantus.  She confirms she has access to internet.  CM reviewed [employer] and patient drug assistance programs/coupons. Pt appreciative of resources provided.  CM found and printed patient assistance program applications for novolog, lantus, lithium, protonix, and seroquel and provided to pt.  Encouraged completion of applications and mailing to each company.  Pt voiced understanding.

## 2011-07-11 NOTE — ED Notes (Signed)
N/V since yest. Now pt has dry heaves. Pt not taking insulin for unknown amount of time. Pt has significant needle marks on arm. Yelling at EMS staff and not answering questions.

## 2011-07-11 NOTE — ED Notes (Signed)
Iv team notified 

## 2011-07-11 NOTE — ED Notes (Signed)
Pt does not have iv at this time, iv team was paged per reporting nurse, will follow up

## 2011-07-12 ENCOUNTER — Other Ambulatory Visit: Payer: Self-pay

## 2011-07-12 LAB — GLUCOSE, CAPILLARY
Glucose-Capillary: 156 mg/dL — ABNORMAL HIGH (ref 70–99)
Glucose-Capillary: 167 mg/dL — ABNORMAL HIGH (ref 70–99)
Glucose-Capillary: 165 mg/dL — ABNORMAL HIGH (ref 70–99)
Glucose-Capillary: 205 mg/dL — ABNORMAL HIGH (ref 70–99)
Glucose-Capillary: 74 mg/dL (ref 70–99)
Glucose-Capillary: 140 mg/dL — ABNORMAL HIGH (ref 70–99)
Glucose-Capillary: 133 mg/dL — ABNORMAL HIGH (ref 70–99)
Glucose-Capillary: 175 mg/dL — ABNORMAL HIGH (ref 70–99)

## 2011-07-12 LAB — COMPREHENSIVE METABOLIC PANEL
AST: 59 U/L — ABNORMAL HIGH (ref 0–37)
BUN: 14 mg/dL (ref 6–23)
CO2: 23 mEq/L (ref 19–32)
Calcium: 9.1 mg/dL (ref 8.4–10.5)
Creatinine, Ser: 0.57 mg/dL (ref 0.50–1.10)
GFR calc Af Amer: >90 mL/min (ref >90)
GFR calc non Af Amer: >90 mL/min (ref >90)
Total Bilirubin: 0.2 mg/dL — ABNORMAL LOW (ref 0.3–1.2)

## 2011-07-12 LAB — RAPID URINE DRUG SCREEN, HOSP PERFORMED
Amphetamines: NOT DETECTED
Barbiturates: NOT DETECTED
Cocaine: NOT DETECTED
Tetrahydrocannabinol: POSITIVE — AB

## 2011-07-12 LAB — CBC
Hemoglobin: 10 g/dL — ABNORMAL LOW (ref 12.0–15.0)
MCH: 23.2 pg — ABNORMAL LOW (ref 26.0–34.0)
Platelets: 268 10*3/uL (ref 150–400)
RBC: 4.26 MIL/uL (ref 3.87–5.11)
WBC: 15.4 10*3/uL — ABNORMAL HIGH (ref 4.0–10.5)

## 2011-07-12 MED ORDER — SODIUM CHLORIDE 0.9 % IV SOLN
INTRAVENOUS | Status: DC
  Administered 2011-07-12: 1000 mL via INTRAVENOUS

## 2011-07-12 MED ORDER — INSULIN ASPART 100 UNIT/ML ~~LOC~~ SOLN: 0.0000 [IU] | Freq: Every day | SUBCUTANEOUS | Status: DC

## 2011-07-12 MED ORDER — SENNOSIDES-DOCUSATE SODIUM 8.6-50 MG PO TABS
1.0000 | ORAL_TABLET | Freq: Every day | ORAL | Status: DC
  Administered 2011-07-12: 1 via ORAL
  Filled 2011-07-12 (×2): qty 1

## 2011-07-12 MED ORDER — INSULIN GLARGINE 100 UNIT/ML ~~LOC~~ SOLN
10.0000 [IU] | Freq: Once | SUBCUTANEOUS | Status: AC
  Administered 2011-07-12: 10 [IU] via SUBCUTANEOUS
  Filled 2011-07-12: qty 3

## 2011-07-12 MED ORDER — INSULIN GLARGINE 100 UNIT/ML ~~LOC~~ SOLN: 25.0000 [IU] | Freq: Once | SUBCUTANEOUS | Status: DC

## 2011-07-12 MED ORDER — SODIUM CHLORIDE 0.9 % IV SOLN: INTRAVENOUS | Status: DC

## 2011-07-12 MED ORDER — INSULIN ASPART 100 UNIT/ML ~~LOC~~ SOLN
0.0000 [IU] | Freq: Three times a day (TID) | SUBCUTANEOUS | Status: DC
  Administered 2011-07-12: 15 [IU] via SUBCUTANEOUS
  Administered 2011-07-12: 3 [IU] via SUBCUTANEOUS
  Administered 2011-07-13: 8 [IU] via SUBCUTANEOUS
  Administered 2011-07-13: 3 [IU] via SUBCUTANEOUS
  Filled 2011-07-12: qty 3

## 2011-07-12 MED ORDER — INSULIN GLARGINE 100 UNIT/ML ~~LOC~~ SOLN
35.0000 [IU] | Freq: Every day | SUBCUTANEOUS | Status: DC
  Administered 2011-07-12: 35 [IU] via SUBCUTANEOUS

## 2011-07-12 NOTE — Progress Notes (Signed)
Remains on insulin drip at 1.1 units per hour for DKA. Initial blood sugar in the 600 range when admitted. Lethargic, flat affect noted, VSS. R arm Picc line placed last night per Picc RN. Zofran IV given earlier in shift for c/o nausea, no emesis noted.

## 2011-07-12 NOTE — Progress Notes (Signed)
Subjective: States she feels much better this morning, and no further nausea or vomiting, also states that the abd.  pain is much improved. Objective: Vital signs in last 24 hours: Temp:  [97.9 F (36.6 C)-99 F (37.2 C)] 99 F (37.2 C) (01/25 0800) Pulse Rate:  [75-93] 84  (01/25 1000) Resp:  [12-17] 13  (01/25 1000) BP: (84-145)/(50-81) 109/72 mmHg (01/25 1000) SpO2:  [99 %-100 %] 100 % (01/25 1000) Weight:  [46.4 kg (102 lb 4.7 oz)] 46.4 kg (102 lb 4.7 oz) (01/24 1600)   Intake/Output from previous day: 01/24 0701 - 01/25 0700 In: 2210.4 [I.V.:2210.4] Out: 625 [Urine:625] Intake/Output this shift: Total I/O In: 507.5 [I.V.:507.5] Out: -     General Appearance:    Alert, cooperative, no distress, appears stated age  Lungs:     Clear to auscultation bilaterally, respirations unlabored   Heart:    Regular rate and rhythm, S1 and S2 normal, no murmur, rub   or gallop  Abdomen:     Soft, non-tender, bowel sounds active all four quadrants,    no masses, no organomegaly  Extremities:   Extremities normal, atraumatic, no cyanosis or edema  Neurologic:   CNII-XII intact, nonfocal     Weight change: -3.496 kg (-7 lb 11.3 oz)  Intake/Output Summary (Last 24 hours) at 07/12/11 1241 Last data filed at 07/12/11 1100  Gross per 24 hour  Intake 2717.9 ml  Output    625 ml  Net 2092.9 ml    Lab Results:   Basename 07/12/11 0415 07/11/11 0810  NA 142 139  K 3.5 4.9  CL 112 90*  CO2 23 14*  GLUCOSE 166* 621*  BUN 14 23  CREATININE 0.57 0.69  CALCIUM 9.1 11.2*    Basename 07/12/11 0415 07/11/11 0810  WBC 15.4* 17.2*  HGB 10.0* 14.7  HCT 32.0* 46.0  PLT 268 366  MCV 75.1* 76.2*   PT/INR No results found for this basename: LABPROT:2,INR:2 in the last 72 hours ABG  Basename 07/11/11 0830  PHART --  HCO3 15.1*    Micro Results: No results found for this or any previous visit (from the past 240 hour(s)). Studies/Results: Acute Abdominal Series  07/11/2011   *RADIOLOGY REPORT*  Clinical Data: Abdominal pain, nausea and vomiting  ACUTE ABDOMEN SERIES (ABDOMEN 2 VIEW & CHEST 1 VIEW)  Comparison: Plain films 04/25/2011  Findings: Normal cardiac silhouette.  Lungs are clear.  No free air beneath hemidiaphragms.  No dilated loops of large or small bowel.  No pathologic calcifications.  Sigmoid scoliosis noted. There is a moderate volume stool throughout the colon but is much improved compared to prior.  IMPRESSION:  1.  No acute cardiopulmonary findings. 2.  No evidence of bowel obstruction or intraperitoneal free air. 3.  Moderate volume stool throughout the colon is improved compared to prior.  Original Report Authenticated By: Genevive Bi, M.D.   Medications:  Scheduled Meds:   . aspirin EC  81 mg Oral Daily  . enoxaparin  40 mg Subcutaneous Q24H  .  HYDROmorphone (DILAUDID) injection  1 mg Intravenous Once  . insulin aspart  0-15 Units Subcutaneous TID WC  . insulin glargine  10 Units Subcutaneous Once  . insulin glargine  35 Units Subcutaneous QHS  . lithium carbonate  600 mg Oral QHS  . pantoprazole (PROTONIX) IV  40 mg Intravenous Q12H  . potassium chloride  10 mEq Intravenous Once  . QUEtiapine  50 mg Oral QHS  . sodium chloride  10-40 mL  Intracatheter Q12H  . DISCONTD: insulin aspart  0-5 Units Subcutaneous QHS  . DISCONTD: insulin glargine  25 Units Subcutaneous Once  . DISCONTD: insulin regular  0-10 Units Intravenous TID WC   Continuous Infusions:   . sodium chloride    . dextrose 5 % and 0.45% NaCl 125 mL/hr at 07/12/11 0025  . insulin (NOVOLIN-R) infusion 1.6 Units/hr (07/12/11 1100)  . DISCONTD: sodium chloride 200 mL/hr at 07/11/11 1450  . DISCONTD: insulin (NOVOLIN-R) infusion 4.4 Units/hr (07/11/11 1449)   PRN Meds:.acetaminophen, acetaminophen, bisacodyl, dextrose, guaiFENesin-dextromethorphan, morphine, ondansetron (ZOFRAN) IV, ondansetron, sodium chloride Assessment/Plan: DKA (diabetic ketoacidoses) Glucose control much  improved, we'll transition to Lantus and cover with sliding scale insulin.  .Abdominal pain -improved-abdominal series with no evidence of obstruction, moderate volume stool noted in the colon Will add Senokot, when necessary laxatives for constipation and follow. .Leukocytosis -trending down, likely reactive, follow and recheck -UA negative for infection as above, chest x-ray with no acute cardiopulmonary findings  .DM (diabetes mellitus)   -As above, diabetes coordinator for inpatient teaching. .Bipolar disorder  -Continue outpatient medications.  .H/OSeizure disorder  -On no medications, follow  .Tobacco abuse  .Noncompliance with medication regimen  .Volume depletion/dehydration-improved with hydration .Hypercalcemia-  likely secondary to dehydration, resolved with hydration.Marland Kitchen  Marland KitchenHistory of cirrhosis -monitor of fluid status, recheck LFTs -Will transfer to medical floor.  LOS: 1 day   Rieley Hausman C 07/12/2011, 12:41 PM

## 2011-07-12 NOTE — Progress Notes (Signed)
Inpatient Diabetes Program Recommendations  AACE/ADA: New Consensus Statement on Inpatient Glycemic Control (2009)  Target Ranges:  Prepandial:   less than 140 mg/dL      Peak postprandial:   less than 180 mg/dL (1-2 hours)      Critically ill patients:  140 - 180 mg/dL   Reason for Visit: Hyperglycemia / DKA  Results for Mary [name], Mary [name] (MRN [id number]) as of 07/12/2011 15:48  Ref. Range 07/12/2011 00:24 07/12/2011 00:33 07/12/2011 01:16 07/12/2011 02:14 07/12/2011 03:07 07/12/2011 04:01 07/12/2011 05:22 07/12/2011 06:30 07/12/2011 07:26 07/12/2011 09:00 07/12/2011 09:57 07/12/2011 10:57 07/12/2011 12:08 07/12/2011 13:10  Glucose-Capillary Latest Range: 70-99 mg/dL 086 (H) 578 (H) 469 (H) 126 (H) 156 (H) 167 (H) 165 (H) 205 (H) 182 (H) 74 156 (H) 140 (H) 133 (H) 175 (H)     Inpatient Diabetes Program Recommendations Insulin - Basal: Recommend 70/30 28 units bid when pt is discharged since pt cannot afford Lantus.  Correction (SSI): Add HS coverage Insulin - Meal Coverage: will definitely need meal coverage insulin - Novolog 6 units tidwc and titrate up as po intake increases HgbA1C: 13.8% uncontrolled  Note: Lantus 10 units given 1 hour before GS was discontinued.

## 2011-07-13 LAB — BASIC METABOLIC PANEL
BUN: 14 mg/dL (ref 6–23)
Calcium: 8.5 mg/dL (ref 8.4–10.5)
GFR calc Af Amer: >90 mL/min (ref >90)
GFR calc non Af Amer: >90 mL/min (ref >90)
Potassium: 3.2 mEq/L — ABNORMAL LOW (ref 3.5–5.1)

## 2011-07-13 LAB — GLUCOSE, CAPILLARY: Glucose-Capillary: 166 mg/dL — ABNORMAL HIGH (ref 70–99)

## 2011-07-13 LAB — CBC
HCT: 33.2 % — ABNORMAL LOW (ref 36.0–46.0)
MCH: 23.7 pg — ABNORMAL LOW (ref 26.0–34.0)
MCHC: 31.3 g/dL (ref 30.0–36.0)
RDW: 14.9 % (ref 11.5–15.5)

## 2011-07-13 MED ORDER — PANTOPRAZOLE SODIUM 40 MG PO TBEC
40.0000 mg | DELAYED_RELEASE_TABLET | Freq: Two times a day (BID) | ORAL | Status: DC
  Administered 2011-07-13: 40 mg via ORAL
  Filled 2011-07-13: qty 1

## 2011-07-13 MED ORDER — POTASSIUM CHLORIDE CRYS ER 20 MEQ PO TBCR
40.0000 meq | EXTENDED_RELEASE_TABLET | ORAL | Status: AC
  Administered 2011-07-13 (×2): 40 meq via ORAL
  Filled 2011-07-13 (×2): qty 2

## 2011-07-13 NOTE — Progress Notes (Signed)
Protonix was changed from IV to PO per P&T policy.    Consider changing to once daily protonix if appropriate.   Lakeithia Rasor, Loma Messing PharmD 8:54 AM 07/13/2011

## 2011-07-13 NOTE — Discharge Summary (Signed)
Discharge Note  Name: Mary [name] MRN: [id number] DOB: April 09, 1971 41 y.o.  Date of Admission: 07/11/2011  6:35 AM Date of Discharge: 07/13/2011 Attending Physician: Kela Millin, MD  Discharge Diagnosis: Active Problems:  DKA (diabetic ketoacidoses)  Abdominal pain  DM (diabetes mellitus)  Leukocytosis  Bipolar disorder  Seizure disorder  Non compliance w medication regimen  Tobacco abuse  h/o of cirrhosis Marijuana Abuse  Discharge Medications: Medication List  As of 07/13/2011  9:47 AM   TAKE these medications         aspirin EC 81 MG tablet   Take 81 mg by mouth daily.      insulin aspart 100 UNIT/ML injection   Commonly known as: novoLOG   Inject 6 Units into the skin 3 (three) times daily with meals.      insulin glargine 100 UNIT/ML injection   Commonly known as: LANTUS   Inject 50 Units into the skin at bedtime.      lithium carbonate 300 MG CR tablet   Commonly known as: LITHOBID   Take 600 mg by mouth at bedtime.      magnesium gluconate 500 MG tablet   Commonly known as: MAGONATE   Take 500 mg by mouth 2 (two) times daily.      pantoprazole 40 MG tablet   Commonly known as: PROTONIX   Take 1 tablet (40 mg total) by mouth daily.      QUEtiapine 50 MG tablet   Commonly known as: SEROQUEL   Take 50 mg by mouth at bedtime.            Disposition and follow-up:   [name] was discharged from [facility] in improved/stable condition.    Follow-up Appointments: Discharge Orders    Future Orders Please Complete By Expires   Diet Carb Modified      Activity as tolerated - No restrictions         Consultations:    Procedures Performed:  Acute Abdominal Series  07/11/2011  *RADIOLOGY REPORT*  Clinical Data: Abdominal pain, nausea and vomiting  ACUTE ABDOMEN SERIES (ABDOMEN 2 VIEW & CHEST 1 VIEW)  Comparison: Plain films 04/25/2011  Findings: Normal cardiac silhouette.  Lungs are clear.  No free air beneath  hemidiaphragms.  No dilated loops of large or small bowel.  No pathologic calcifications.  Sigmoid scoliosis noted. There is a moderate volume stool throughout the colon but is much improved compared to prior.  IMPRESSION:  1.  No acute cardiopulmonary findings. 2.  No evidence of bowel obstruction or intraperitoneal free air. 3.  Moderate volume stool throughout the colon is improved compared to prior.  Original Report Authenticated By: Genevive Bi, M.D.     Admission HPI The patient is a 41 year old black female with past medical history significant for diabetes mellitus and noncompliance with medication regimen, and multiple admissions for DKA in the last discharged from the hospital 06/28/2011 following treatment for same who presents with above complaints. She states that she ran out of her Lantus about 2 days ago although she insists that she's been taking her NovoLog. Yesterday she developed nausea vomiting along with abdominal pain. She describes and abdominal pain as sharp, diffuse, intermittent lasting about 20 minutes at that time. She reports that she also began having nausea vomiting-has had multiple episodes are, nonbloody. She denies diarrhea. Patient also denies of fevers, dysuria, chest pain, shortness of breath ,melena and no hematochezia. She admits to a nonproductive cough.  She was seen in the ED and blood sugars noted to be in the 600 range, with a CO2 of 14, white cell count was noted to be elevated at 17. Urinalysis and negative for infection. She was started IV insulin and is admitted to the hospitalist service for further evaluation and management.    Physical Exam  Hospital Course by problem list: Active Problems:  DKA (diabetic ketoacidoses)  Abdominal pain  DM (diabetes mellitus)  Leukocytosis  Bipolar disorder  Seizure disorder  Non compliance w medication regimen  Tobacco abuse  H/O cirrhosis Marijuana abuse  Present on Admission:  .DKA (diabetic  ketoacidoses)  -As discussed above,  on admission the patient was started on IV insulin via glucostabilizer. When her acidosis resolved and she She met  criteria she was transitioned off the IV insulin and placed on Lantus with sliding scale coverage. Glucose control is much improved and on the Lantus today, and she is tolerating by mouth well. She'll be discharged on  her Lantus and insulin Lantus-260 units in the Lantus pen and [facility]. Social work is also to see patient for resources for more help with her Lantus. Had no further abdominal pain, and a workup including lipase urinalysis abdominal series, was negative during this hospital stay.The Impression was that this was secondary to medication noncompliance  and volume depletion. .Leukocytosis  -UA negative for infection as above, obtain chest x-ray and follow. secondary to stress demargination,resolved on no antibiotics.  .DM (diabetes mellitus)  -importance of medical compliance was emphasized, or or followup with arm PCP for further monitoring of her sugars and management as appropriate, as well as assistance with her medications.  .Bipolar disorder  -she is to Continue her outpatient medications.  .H/OSeizure disorder  - she remained  seizure-free On no medications. .Tobacco abuse  .Noncompliance with medication regimen- as above  .Volume depletion/dehydration-hydrate follow and recheck.  .Hypercalcemia- resolved with hydration, secondary to volume depletion/dehydration f/u outpatient  .History of cirrhosis -stable, f/u with PCP. Marland Kitchen Marijuana abuse -counseled to quit   Discharge Vitals:  BP 109/72  Pulse 64  Temp(Src) 98.6 F (37 C) (Oral)  Resp 16  Ht 5\' 4"  (1.626 m)  Wt 50.2 kg (110 lb 10.7 oz)  BMI 19.00 kg/m2  SpO2 100%  LMP 06/29/2011  Discharge Labs:  Results for orders placed during the hospital encounter of 07/11/11 (from the past 24 hour(s))  GLUCOSE, CAPILLARY      Status: Abnormal   Collection Time   07/12/11  9:57 AM      Component Value Range   Glucose-Capillary 156 (*) 70 - 99 (mg/dL)   Comment 1 Documented in Chart     Comment 2 Notify RN    GLUCOSE, CAPILLARY     Status: Abnormal   Collection Time   07/12/11 10:57 AM      Component Value Range   Glucose-Capillary 140 (*) 70 - 99 (mg/dL)   Comment 1 Notify RN     Comment 2 Documented in Chart    GLUCOSE, CAPILLARY     Status: Abnormal   Collection Time   07/12/11 12:08 PM      Component Value Range   Glucose-Capillary 133 (*) 70 - 99 (mg/dL)   Comment 1 Documented in Chart     Comment 2 Notify RN    URINE RAPID DRUG SCREEN (HOSP PERFORMED)     Status: Abnormal   Collection Time   07/12/11 12:19  PM      Component Value Range   Opiates POSITIVE (*) NONE DETECTED    Cocaine NONE DETECTED  NONE DETECTED    Benzodiazepines NONE DETECTED  NONE DETECTED    Amphetamines NONE DETECTED  NONE DETECTED    Tetrahydrocannabinol POSITIVE (*) NONE DETECTED    Barbiturates NONE DETECTED  NONE DETECTED   GLUCOSE, CAPILLARY     Status: Abnormal   Collection Time   07/12/11  1:10 PM      Component Value Range   Glucose-Capillary 175 (*) 70 - 99 (mg/dL)  GLUCOSE, CAPILLARY     Status: Abnormal   Collection Time   07/12/11  4:31 PM      Component Value Range   Glucose-Capillary 396 (*) 70 - 99 (mg/dL)   Comment 1 Documented in Chart     Comment 2 Notify RN    GLUCOSE, CAPILLARY     Status: Abnormal   Collection Time   07/12/11  9:36 PM      Component Value Range   Glucose-Capillary 172 (*) 70 - 99 (mg/dL)   Comment 1 Documented in Chart     Comment 2 Notify RN    CBC     Status: Abnormal   Collection Time   07/13/11  5:55 AM      Component Value Range   WBC 10.4  4.0 - 10.5 (K/uL)   RBC 4.38  3.87 - 5.11 (MIL/uL)   Hemoglobin 10.4 (*) 12.0 - 15.0 (g/dL)   HCT 16.1 (*) 09.6 - 46.0 (%)   MCV 75.8 (*) 78.0 - 100.0 (fL)   MCH 23.7 (*) 26.0 - 34.0 (pg)   MCHC 31.3  30.0 - 36.0 (g/dL)   RDW 04.5   40.9 - 81.1 (%)   Platelets 206  150 - 400 (K/uL)  BASIC METABOLIC PANEL     Status: Abnormal   Collection Time   07/13/11  5:55 AM      Component Value Range   Sodium 137  135 - 145 (mEq/L)   Potassium 3.2 (*) 3.5 - 5.1 (mEq/L)   Chloride 110  96 - 112 (mEq/L)   CO2 20  19 - 32 (mEq/L)   Glucose, Bld 209 (*) 70 - 99 (mg/dL)   BUN 14  6 - 23 (mg/dL)   Creatinine, Ser 9.14  0.50 - 1.10 (mg/dL)   Calcium 8.5  8.4 - 78.2 (mg/dL)   GFR calc non Af Amer >90  >90 (mL/min)   GFR calc Af Amer >90  >90 (mL/min)  GLUCOSE, CAPILLARY     Status: Abnormal   Collection Time   07/13/11  7:38 AM      Component Value Range   Glucose-Capillary 166 (*) 70 - 99 (mg/dL)   Comment 1 Documented in Chart     Comment 2 Notify RN      SignedKela Millin 07/13/2011, 9:47 AM

## 2011-07-13 NOTE — Progress Notes (Signed)
Cm 's contacted by floor RN Lurena Joiner concerning request to speak with CM. Cm spoke with pt who stated misplaced [employer] applications provided by CM during the weeks to assist with medication cost. CM re-printed applications for pt from [facility] website. Pt has [facility] appt Monday. Pt to d/c home with Novolog and lantus flex pens. Pt agrees with plan.    Mary [name] [phone]

## 2011-07-15 LAB — GLUCOSE, CAPILLARY: Glucose-Capillary: 251 mg/dL — ABNORMAL HIGH (ref 70–99)

## 2011-07-28 ENCOUNTER — Encounter (HOSPITAL_COMMUNITY): Payer: Self-pay

## 2011-07-28 ENCOUNTER — Emergency Department (HOSPITAL_COMMUNITY): Payer: Medicaid Other

## 2011-07-28 ENCOUNTER — Inpatient Hospital Stay (HOSPITAL_COMMUNITY)
Admission: EM | Admit: 2011-07-28 | Discharge: 2011-07-30 | DRG: 638 | Disposition: A | Discharge status: Home / Self Care | Payer: Medicaid Other | Attending: Family Medicine | Admitting: Family Medicine

## 2011-07-28 DIAGNOSIS — F3189 Other bipolar disorder: Secondary | ICD-10-CM | POA: Diagnosis present

## 2011-07-28 DIAGNOSIS — Z87442 Personal history of urinary calculi: Secondary | ICD-10-CM

## 2011-07-28 DIAGNOSIS — F319 Bipolar disorder, unspecified: Secondary | ICD-10-CM | POA: Diagnosis present

## 2011-07-28 DIAGNOSIS — I739 Peripheral vascular disease, unspecified: Secondary | ICD-10-CM | POA: Diagnosis present

## 2011-07-28 DIAGNOSIS — Z9114 Patient's other noncompliance with medication regimen: Secondary | ICD-10-CM

## 2011-07-28 DIAGNOSIS — F4310 Post-traumatic stress disorder, unspecified: Secondary | ICD-10-CM | POA: Diagnosis present

## 2011-07-28 DIAGNOSIS — E1110 Type 2 diabetes mellitus with ketoacidosis without coma: Secondary | ICD-10-CM | POA: Diagnosis present

## 2011-07-28 DIAGNOSIS — B1920 Unspecified viral hepatitis C without hepatic coma: Secondary | ICD-10-CM | POA: Diagnosis present

## 2011-07-28 DIAGNOSIS — F17200 Nicotine dependence, unspecified, uncomplicated: Secondary | ICD-10-CM | POA: Diagnosis present

## 2011-07-28 DIAGNOSIS — G40909 Epilepsy, unspecified, not intractable, without status epilepticus: Secondary | ICD-10-CM | POA: Diagnosis present

## 2011-07-28 DIAGNOSIS — Z794 Long term (current) use of insulin: Secondary | ICD-10-CM

## 2011-07-28 DIAGNOSIS — E1010 Type 1 diabetes mellitus with ketoacidosis without coma: Principal | ICD-10-CM | POA: Diagnosis present

## 2011-07-28 DIAGNOSIS — Z91199 Patient's noncompliance with other medical treatment and regimen due to unspecified reason: Secondary | ICD-10-CM

## 2011-07-28 DIAGNOSIS — K7460 Unspecified cirrhosis of liver: Secondary | ICD-10-CM | POA: Diagnosis present

## 2011-07-28 DIAGNOSIS — F1910 Other psychoactive substance abuse, uncomplicated: Secondary | ICD-10-CM | POA: Diagnosis present

## 2011-07-28 DIAGNOSIS — R112 Nausea with vomiting, unspecified: Secondary | ICD-10-CM | POA: Diagnosis present

## 2011-07-28 DIAGNOSIS — Z9119 Patient's noncompliance with other medical treatment and regimen: Secondary | ICD-10-CM

## 2011-07-28 LAB — URINALYSIS, ROUTINE W REFLEX MICROSCOPIC
Bilirubin Urine: NEGATIVE
Glucose, UA: >1000 mg/dL — AB
Ketones, ur: >80 mg/dL — AB
pH: 5.5 (ref 5.0–8.0)

## 2011-07-28 LAB — GLUCOSE, CAPILLARY
Glucose-Capillary: 142 mg/dL — ABNORMAL HIGH (ref 70–99)
Glucose-Capillary: 173 mg/dL — ABNORMAL HIGH (ref 70–99)
Glucose-Capillary: 205 mg/dL — ABNORMAL HIGH (ref 70–99)
Glucose-Capillary: 212 mg/dL — ABNORMAL HIGH (ref 70–99)
Glucose-Capillary: 320 mg/dL — ABNORMAL HIGH (ref 70–99)
Glucose-Capillary: 443 mg/dL — ABNORMAL HIGH (ref 70–99)

## 2011-07-28 LAB — BASIC METABOLIC PANEL
CO2: 16 mEq/L — ABNORMAL LOW (ref 19–32)
Calcium: 10.3 mg/dL (ref 8.4–10.5)
Calcium: 9.4 mg/dL (ref 8.4–10.5)
Chloride: 96 mEq/L (ref 96–112)
Chloride: 109 mEq/L (ref 96–112)
Creatinine, Ser: 0.37 mg/dL — ABNORMAL LOW (ref 0.50–1.10)
GFR calc Af Amer: >90 mL/min (ref >90)
Glucose, Bld: 452 mg/dL — ABNORMAL HIGH (ref 70–99)
Sodium: 137 mEq/L (ref 135–145)
Sodium: 145 mEq/L (ref 135–145)

## 2011-07-28 LAB — CARDIAC PANEL(CRET KIN+CKTOT+MB+TROPI)
Relative Index: INVALID (ref 0.0–2.5)
Troponin I: <0.3 ng/mL (ref <0.30)

## 2011-07-28 LAB — CBC
Hemoglobin: 13.5 g/dL (ref 12.0–15.0)
MCH: 24.1 pg — ABNORMAL LOW (ref 26.0–34.0)
MCV: 76.4 fL — ABNORMAL LOW (ref 78.0–100.0)
RBC: 5.6 MIL/uL — ABNORMAL HIGH (ref 3.87–5.11)
WBC: 11.9 10*3/uL — ABNORMAL HIGH (ref 4.0–10.5)

## 2011-07-28 LAB — LIPASE, BLOOD: Lipase: 36 U/L (ref 11–59)

## 2011-07-28 LAB — DIFFERENTIAL
Eosinophils Absolute: 0 10*3/uL (ref 0.0–0.7)
Eosinophils Relative: 0 % (ref 0–5)
Lymphocytes Relative: 15 % (ref 12–46)
Lymphs Abs: 1.7 10*3/uL (ref 0.7–4.0)
Monocytes Relative: 2 % — ABNORMAL LOW (ref 3–12)
Neutrophils Relative %: 83 % — ABNORMAL HIGH (ref 43–77)

## 2011-07-28 LAB — HEPATIC FUNCTION PANEL
ALT: 55 U/L — ABNORMAL HIGH (ref 0–35)
AST: 66 U/L — ABNORMAL HIGH (ref 0–37)
Albumin: 3.6 g/dL (ref 3.5–5.2)
Total Protein: 7.3 g/dL (ref 6.0–8.3)

## 2011-07-28 LAB — RAPID URINE DRUG SCREEN, HOSP PERFORMED
Amphetamines: NOT DETECTED
Benzodiazepines: NOT DETECTED
Cocaine: NOT DETECTED
Opiates: NOT DETECTED

## 2011-07-28 LAB — LITHIUM LEVEL: Lithium Lvl: <0.25 mEq/L — ABNORMAL LOW (ref 0.80–1.40)

## 2011-07-28 LAB — PREGNANCY, URINE: Preg Test, Ur: NEGATIVE

## 2011-07-28 MED ORDER — SODIUM CHLORIDE 0.9 % IV SOLN
INTRAVENOUS | Status: DC
  Administered 2011-07-28: 4.4 [IU]/h via INTRAVENOUS
  Administered 2011-07-28: 2.9 [IU]/h via INTRAVENOUS
  Filled 2011-07-28: qty 1

## 2011-07-28 MED ORDER — QUETIAPINE FUMARATE 50 MG PO TABS
50.0000 mg | ORAL_TABLET | Freq: Every day | ORAL | Status: DC
  Administered 2011-07-29 (×2): 50 mg via ORAL
  Filled 2011-07-28 (×4): qty 1

## 2011-07-28 MED ORDER — MAGNESIUM GLUCONATE 500 MG PO TABS
500.0000 mg | ORAL_TABLET | Freq: Two times a day (BID) | ORAL | Status: DC
  Administered 2011-07-29 – 2011-07-30 (×4): 500 mg via ORAL
  Filled 2011-07-28 (×8): qty 1

## 2011-07-28 MED ORDER — ASPIRIN EC 81 MG PO TBEC
81.0000 mg | DELAYED_RELEASE_TABLET | Freq: Every day | ORAL | Status: DC
  Administered 2011-07-29 – 2011-07-30 (×3): 81 mg via ORAL
  Filled 2011-07-28 (×4): qty 1

## 2011-07-28 MED ORDER — LITHIUM CARBONATE ER 300 MG PO TBCR
600.0000 mg | EXTENDED_RELEASE_TABLET | Freq: Every day | ORAL | Status: DC
  Administered 2011-07-29 (×2): 600 mg via ORAL
  Filled 2011-07-28 (×4): qty 2

## 2011-07-28 MED ORDER — DEXTROSE 50 % IV SOLN: 25.0000 mL | INTRAVENOUS | Status: DC | PRN

## 2011-07-28 MED ORDER — SODIUM CHLORIDE 0.9 % IV SOLN
INTRAVENOUS | Status: DC
  Administered 2011-07-28: 1000 mL via INTRAVENOUS

## 2011-07-28 MED ORDER — POTASSIUM CHLORIDE 10 MEQ/100ML IV SOLN
10.0000 meq | INTRAVENOUS | Status: AC
  Administered 2011-07-29 (×2): 10 meq via INTRAVENOUS
  Filled 2011-07-28 (×2): qty 100

## 2011-07-28 MED ORDER — SODIUM CHLORIDE 0.9 % IV SOLN: INTRAVENOUS | Status: DC

## 2011-07-28 MED ORDER — MORPHINE SULFATE 4 MG/ML IJ SOLN
4.0000 mg | Freq: Once | INTRAMUSCULAR | Status: AC
  Administered 2011-07-28: 4 mg via INTRAVENOUS
  Filled 2011-07-28: qty 1

## 2011-07-28 MED ORDER — INSULIN REGULAR BOLUS VIA INFUSION
0.0000 [IU] | Freq: Three times a day (TID) | INTRAVENOUS | Status: DC
  Filled 2011-07-28: qty 10

## 2011-07-28 MED ORDER — ONDANSETRON HCL 4 MG/2ML IJ SOLN
4.0000 mg | Freq: Once | INTRAMUSCULAR | Status: AC
  Administered 2011-07-28: 4 mg via INTRAVENOUS
  Filled 2011-07-28: qty 2

## 2011-07-28 MED ORDER — HYDROMORPHONE HCL PF 1 MG/ML IJ SOLN: 1.0000 mg | Freq: Once | INTRAMUSCULAR | Status: DC

## 2011-07-28 MED ORDER — PANTOPRAZOLE SODIUM 40 MG PO TBEC
40.0000 mg | DELAYED_RELEASE_TABLET | Freq: Every day | ORAL | Status: DC
  Administered 2011-07-29 – 2011-07-30 (×3): 40 mg via ORAL
  Filled 2011-07-28 (×4): qty 1

## 2011-07-28 MED ORDER — MORPHINE SULFATE 2 MG/ML IJ SOLN
2.0000 mg | Freq: Four times a day (QID) | INTRAMUSCULAR | Status: DC | PRN
  Administered 2011-07-29: 2 mg via INTRAVENOUS
  Filled 2011-07-28: qty 1

## 2011-07-28 MED ORDER — SODIUM CHLORIDE 0.9 % IV BOLUS (SEPSIS)
1000.0000 mL | Freq: Once | INTRAVENOUS | Status: AC
  Administered 2011-07-28: 1000 mL via INTRAVENOUS

## 2011-07-28 MED ORDER — DEXTROSE-NACL 5-0.45 % IV SOLN
INTRAVENOUS | Status: DC
  Administered 2011-07-28 – 2011-07-29 (×2): via INTRAVENOUS

## 2011-07-28 NOTE — H&P (Signed)
PCP:   Georganna Skeans, MD, MD   Chief Complaint:  Nausea vomiting  HPI: 41 year old female with type 1 diabetes who ran out of her Lantus approximately one week ago who presents to the emergency department with less than 24 hours of nausea vomiting and abdominal pain. She describes abdominal pain as diffuse. She really doesn't know what her glucose has been because she basically stopped taking it when she ran her Lantus a week ago. She has not been eating or drinking for least 24 hours as been feeling really awful. She says she cannot afford her Lantus and that's why she ran out. She denies any blood in her vomit and denies running any fevers at home. She has not had any dysuria or cough. She denies any rashes on her body.  Review of Systems:  Otherwise negative  Past Medical History: Past Medical History  Diagnosis Date  . Diabetes mellitus     diagnosed in 1996-always been on insulin  . Hep C w/o coma, chronic     biopsy in 2010-no rx-was supposed to see a hepatologist in [place]  . Kidney stone   . Seizure disorder     started with pregnancy of first son  . Gastritis   . Bipolar 2 disorder   . Post traumatic stress disorder     "flipping out" after people close to her died  . DKA (diabetic ketoacidoses)     Recurrent admissions for DKA, medication non-complaince, poor social situation  . Cirrhosis   . Angina   . Heart murmur   . Shortness of breath     occassionally  . Seizures     pt states 61yrs ago  . Headache     occassionally  . Arthritis     r ankle-S/P surgery (2007)  . Anxiety   . Peripheral vascular disease     numbness bilaterally feet  . Anemia     childhood  . H/O hiatal hernia     2 yrs ago  . Depression     childhood   Past Surgical History  Procedure Date  . Ankle surgery   . Cesarean section     Medications: Prior to Admission medications   Medication Sig Start Date End Date Taking? Authorizing Provider  aspirin EC 81 MG tablet Take  81 mg by mouth daily.    Yes Historical Provider, MD  insulin aspart (NOVOLOG) 100 UNIT/ML injection Inject 6 Units into the skin 3 (three) times daily with meals. 06/30/11 06/29/12 Yes Mutaz A Elmahi, MD  insulin glargine (LANTUS) 100 UNIT/ML injection Inject 50 Units into the skin at bedtime.   Yes Historical Provider, MD  lithium carbonate (LITHOBID) 300 MG CR tablet Take 600 mg by mouth at bedtime.   Yes Historical Provider, MD  magnesium gluconate (MAGONATE) 500 MG tablet Take 500 mg by mouth 2 (two) times daily.     Yes Historical Provider, MD  pantoprazole (PROTONIX) 40 MG tablet Take 1 tablet (40 mg total) by mouth daily. 06/05/11  Yes Christina Rama, MD  QUEtiapine (SEROQUEL) 50 MG tablet Take 50 mg by mouth at bedtime.     Yes Historical Provider, MD    Allergies:   Allergies  Allergen Reactions  . Penicillins Rash    Social History:  reports that she has been smoking.  She has never used smokeless tobacco. She reports that she does not drink alcohol or use illicit drugs.  Family History: Family History  Problem Relation Age of Onset  . Diabetes  Mother     currently 21  . Fibromyalgia Mother   . Cirrhosis Father     died in 1997-11-19    Physical Exam: Filed Vitals:   07/28/11 1321 07/28/11 1535 07/28/11 1645  BP: 122/80 120/72 135/88  Pulse: 91 94 92  Temp: 98.4 F (36.9 C)    TempSrc: Oral    Resp: 18 15 15   SpO2: 97% 100% 100%   BP 134/83  Pulse 91  Temp(Src) 98.4 F (36.9 C) (Oral)  Resp 16  SpO2 99%  LMP 06/27/2011 General appearance: alert, cooperative and no distress Lungs: clear to auscultation bilaterally Heart: regular rate and rhythm, S1, S2 normal, no murmur, click, rub or gallop Abdomen: soft, non-tender; bowel sounds normal; no masses,  no organomegaly Extremities: extremities normal, atraumatic, no cyanosis or edema Pulses: 2+ and symmetric Skin: Skin color, texture, turgor normal. No rashes or lesions Neurologic: Grossly normal    Labs on  Admission:   Basename 07/28/11 1340  NA 137  K 4.3  CL 96  CO2 16*  GLUCOSE 452*  BUN 15  CREATININE 0.43*  CALCIUM 10.3  MG --  PHOS --    Basename 07/28/11 1340  LIPASE 36  AMYLASE --    Basename 07/28/11 1340  WBC 11.9*  NEUTROABS 9.9*  HGB 13.5  HCT 42.8  MCV 76.4*  PLT 220    Radiological Exams on Admission: Acute Abdominal Series  07/11/2011  *RADIOLOGY REPORT*  Clinical Data: Abdominal pain, nausea and vomiting  ACUTE ABDOMEN SERIES (ABDOMEN 2 VIEW & CHEST 1 VIEW)  Comparison: Plain films 04/25/2011  Findings: Normal cardiac silhouette.  Lungs are clear.  No free air beneath hemidiaphragms.  No dilated loops of large or small bowel.  No pathologic calcifications.  Sigmoid scoliosis noted. There is a moderate volume stool throughout the colon but is much improved compared to prior.  IMPRESSION:  1.  No acute cardiopulmonary findings. 2.  No evidence of bowel obstruction or intraperitoneal free air. 3.  Moderate volume stool throughout the colon is improved compared to prior.  Original Report Authenticated By: Genevive Bi, M.D.    Assessment/Plan Present on Admission:  41 year old female with DKA secondary to probable medical noncompliance  .DKA (diabetic ketoacidoses) .Seizure disorder .Hep C w/o coma, chronic .Cirrhosis of liver .Bipolar disorder  We'll check for other reasons why patient is in DKA such as urinalysis and a chest x-ray to make sure she doesn't have any underlying infectious source. She has a history of cirrhosis of the liver and hepatitis C so we'll also add on LFTs and lipase level to her labs. Her lithium level has been added on which she is on for her seizure disorder. We'll place her in the step down unit on insulin drip and placed on the DKA protocol with aggressive IV fluid management and frequent checking of her electrolytes. Her current anion gap is over 20 we'll monitor this closely also. We'll check a pregnancy test also make sure that  is not issue. And also check one set of cardiac enzymes. She will need evaluation by social work prior to discharged to help ascertain her compliance and financial issues with her Lantus. Further recommendations depending on overall hospital course.    DAVID,RACHAL A [phone] 07/28/2011, 4:53 PM

## 2011-07-28 NOTE — ED Notes (Signed)
C/O generalized abdominal pain since 0600hrs today with (+) nausea and vomiting, (-) diarrhea

## 2011-07-28 NOTE — ED Notes (Signed)
IV team nurse at bedside for additional IV start because pt needs potassium drip.

## 2011-07-28 NOTE — ED Notes (Signed)
XBM:WU13<KG> Expected date:<BR> Expected time: 1:10 PM<BR> Means of arrival:<BR> Comments:<BR> M51 - 40yoF Abd pain all day, same when cbg gets hi.  Can go through triage if needed

## 2011-07-28 NOTE — ED Notes (Signed)
Continuous cardiac monitor applied showing normal sinus rhythm, no ectopy Continuous pulse oximetry applied. Automatic BP cuff applied.

## 2011-07-28 NOTE — ED Provider Notes (Signed)
History     CSN: [id number]  Arrival date & time 07/28/11  1316   First MD Initiated Contact with Patient 07/28/11 1353      Chief Complaint  Patient presents with  . Abdominal Pain    (Consider location/radiation/quality/duration/timing/severity/associated sxs/prior treatment) HPI History provided by pt and prior chart.  Level 5 caveat applies because pt is uncooperative.  Pt has had severe, diffuse abdominal pain with associated nausea and vomiting since yesterday.  Reports that she has had these symptoms intermittently for the last several months and the last time she came to the ED, she was diagnosed w/ DKA.  Denies fever, diarrhea, hematemesis and urinary sx.  Per prior chart, pt admitted 1/241/26 for DKA secondary to medication non-compliance.  Has had multiple admissions for same.  Pt reports that she has not taken her lantus in one week because she can not afford it.   Past Medical History  Diagnosis Date  . Diabetes mellitus     diagnosed in 1996-always been on insulin  . Hep C w/o coma, chronic     biopsy in 2010-no rx-was supposed to see a hepatologist in [place]  . Kidney stone   . Seizure disorder     started with pregnancy of first son  . Gastritis   . Bipolar 2 disorder   . Post traumatic stress disorder     "flipping out" after people close to her died  . DKA (diabetic ketoacidoses)     Recurrent admissions for DKA, medication non-complaince, poor social situation  . Cirrhosis   . Angina   . Heart murmur   . Shortness of breath     occassionally  . Seizures     pt states 70yrs ago  . Headache     occassionally  . Arthritis     r ankle-S/P surgery 12/02/2005)  . Anxiety   . Peripheral vascular disease     numbness bilaterally feet  . Anemia     childhood  . H/O hiatal hernia     2 yrs ago  . Depression     childhood    Past Surgical History  Procedure Date  . Ankle surgery   . Cesarean section     Family History  Problem Relation Age of  Onset  . Diabetes Mother     currently 16  . Fibromyalgia Mother   . Cirrhosis Father     died in 12/02/1997    History  Substance Use Topics  . Smoking status: Current Everyday Smoker -- 0.2 packs/day for 25 years  . Smokeless tobacco: Never Used  . Alcohol Use: No     Endorses hasn't been drinking    OB History    Grav Para Term Preterm Abortions TAB SAB Ect Mult Living                  Review of Systems  All other systems reviewed and are negative.    Allergies  Penicillins  Home Medications   Current Outpatient Rx  Name Route Sig Dispense Refill  . ASPIRIN EC 81 MG PO TBEC Oral Take 81 mg by mouth daily.     . INSULIN ASPART 100 UNIT/ML [place] SOLN Subcutaneous Inject 6 Units into the skin 3 (three) times daily with meals. 1 vial 0  . INSULIN GLARGINE 100 UNIT/ML [place] SOLN Subcutaneous Inject 50 Units into the skin at bedtime.    Marland Kitchen LITHIUM CARBONATE ER 300 MG PO TBCR Oral Take 600 mg by mouth at  bedtime.    Marland Kitchen MAGNESIUM GLUCONATE 500 MG PO TABS Oral Take 500 mg by mouth 2 (two) times daily.      Marland Kitchen PANTOPRAZOLE SODIUM 40 MG PO TBEC Oral Take 1 tablet (40 mg total) by mouth daily. 30 tablet 10  . QUETIAPINE FUMARATE 50 MG PO TABS Oral Take 50 mg by mouth at bedtime.        BP 120/72  Pulse 94  Temp(Src) 98.4 F (36.9 C) (Oral)  Resp 15  SpO2 100%  LMP 06/27/2011  Physical Exam  Nursing note and vitals reviewed. Constitutional: She is oriented to person, place, and time. She appears well-developed and well-nourished. No distress.  HENT:  Head: Normocephalic and atraumatic.  Eyes:       Normal appearance  Neck: Normal range of motion.  Cardiovascular: Normal rate and regular rhythm.   Pulmonary/Chest: Effort normal and breath sounds normal.  Abdominal: Soft. Bowel sounds are normal. She exhibits no distension and no mass. There is no rebound and no guarding.       Diffuse ttp  Musculoskeletal: Normal range of motion.  Neurological: She is alert and oriented to  person, place, and time.  Skin: Skin is warm and dry. No rash noted.  Psychiatric:       Pt calm upon entering room but becomes hysterical and argumentative when you ask her questions.  She says that her pain is aggravated by talking, yet she is yelling her answers.      ED Course  Procedures (including critical care time)  Labs Reviewed  CBC - Abnormal; Notable for the following:    WBC 11.9 (*)    RBC 5.60 (*)    MCV 76.4 (*)    MCH 24.1 (*)    All other components within normal limits  DIFFERENTIAL - Abnormal; Notable for the following:    Neutrophils Relative 83 (*)    Neutro Abs 9.9 (*)    Monocytes Relative 2 (*)    All other components within normal limits  BASIC METABOLIC PANEL - Abnormal; Notable for the following:    CO2 16 (*)    Glucose, Bld 452 (*)    Creatinine, Ser 0.43 (*)    All other components within normal limits  GLUCOSE, CAPILLARY - Abnormal; Notable for the following:    Glucose-Capillary 443 (*)    All other components within normal limits  URINALYSIS, ROUTINE W REFLEX MICROSCOPIC - Abnormal; Notable for the following:    Specific Gravity, Urine 1.033 (*)    Glucose, UA >1000 (*)    Ketones, ur >80 (*)    All other components within normal limits  GLUCOSE, CAPILLARY - Abnormal; Notable for the following:    Glucose-Capillary 352 (*)    All other components within normal limits  LITHIUM LEVEL - Abnormal; Notable for the following:    Lithium Lvl <0.25 (*) REPEATED TO VERIFY   All other components within normal limits  HEPATIC FUNCTION PANEL - Abnormal; Notable for the following:    AST 66 (*)    ALT 55 (*)    Total Bilirubin 0.1 (*)    All other components within normal limits  GLUCOSE, CAPILLARY - Abnormal; Notable for the following:    Glucose-Capillary 320 (*)    All other components within normal limits  URINE RAPID DRUG SCREEN (HOSP PERFORMED) - Abnormal; Notable for the following:    Tetrahydrocannabinol POSITIVE (*)    All other  components within normal limits  GLUCOSE, CAPILLARY - Abnormal; Notable  for the following:    Glucose-Capillary 212 (*)    All other components within normal limits  GLUCOSE, CAPILLARY - Abnormal; Notable for the following:    Glucose-Capillary 205 (*)    All other components within normal limits  BASIC METABOLIC PANEL - Abnormal; Notable for the following:    CO2 18 (*)    Glucose, Bld 209 (*)    Creatinine, Ser 0.37 (*)    All other components within normal limits  GLUCOSE, CAPILLARY - Abnormal; Notable for the following:    Glucose-Capillary 173 (*)    All other components within normal limits  GLUCOSE, CAPILLARY - Abnormal; Notable for the following:    Glucose-Capillary 164 (*)    All other components within normal limits  GLUCOSE, CAPILLARY - Abnormal; Notable for the following:    Glucose-Capillary 201 (*)    All other components within normal limits  GLUCOSE, CAPILLARY - Abnormal; Notable for the following:    Glucose-Capillary 142 (*)    All other components within normal limits  BASIC METABOLIC PANEL - Abnormal; Notable for the following:    CO2 15 (*)    Glucose, Bld 203 (*)    Creatinine, Ser 0.45 (*)    All other components within normal limits  BASIC METABOLIC PANEL - Abnormal; Notable for the following:    Creatinine, Ser 0.41 (*)    All other components within normal limits  CBC - Abnormal; Notable for the following:    WBC 12.3 (*)    Hemoglobin 10.6 (*)    HCT 33.0 (*)    MCV 74.8 (*)    MCH 24.0 (*)    All other components within normal limits  GLUCOSE, CAPILLARY - Abnormal; Notable for the following:    Glucose-Capillary 205 (*)    All other components within normal limits  GLUCOSE, CAPILLARY - Abnormal; Notable for the following:    Glucose-Capillary 128 (*)    All other components within normal limits  GLUCOSE, CAPILLARY - Abnormal; Notable for the following:    Glucose-Capillary 217 (*)    All other components within normal limits  GLUCOSE,  CAPILLARY - Abnormal; Notable for the following:    Glucose-Capillary 131 (*)    All other components within normal limits  GLUCOSE, CAPILLARY - Abnormal; Notable for the following:    Glucose-Capillary 128 (*)    All other components within normal limits  LIPASE, BLOOD  URINE MICROSCOPIC-ADD ON  CARDIAC PANEL(CRET KIN+CKTOT+MB+TROPI)  PREGNANCY, URINE  GLUCOSE, CAPILLARY  GLUCOSE, CAPILLARY  CARDIAC PANEL(CRET KIN+CKTOT+MB+TROPI)  MRSA PCR SCREENING  BASIC METABOLIC PANEL  BASIC METABOLIC PANEL  HEMOGLOBIN A1C   Dg Chest 1 View  07/28/2011  *RADIOLOGY REPORT*  Clinical Data: Abdominal pain, diabetes  CHEST - 1 VIEW  Comparison: 04/27/2011  Findings: Cardiomediastinal silhouette is stable.  No acute infiltrate or pleural effusion.  No pulmonary edema.  Bony thorax is stable.  IMPRESSION: No active disease.  No significant change.  Original Report Authenticated By: Natasha Mead, M.D.     1. DKA (diabetic ketoacidoses)       MDM  Pt w/ h/o diabetes presents w/ c/o N/V and abdominal pain.  Admitted frequently for DKA d/t medication non-compliance.  Has not taken her lantus for the past week.  On exam, pt is agitated and argumentative, afebrile, abd benign but diffusely tender.  Labs sig for hyperglycemia w/ bicarb of 16 and anion gap of 25.  Pt received a NS bolus and is currently on glucostabilizer.  She has  also received IV zofran and morphine for sx.  Dr. Rosalia Hammers has consulted [facility] for admission.          Arie Sabina Bridger, [place] 07/29/11 1135

## 2011-07-28 NOTE — ED Notes (Signed)
Labs for this pt not drawn by lab tech at specified time. Other lab tech just informed RN of this.

## 2011-07-29 ENCOUNTER — Encounter (HOSPITAL_COMMUNITY): Payer: Self-pay | Admitting: *Deleted

## 2011-07-29 DIAGNOSIS — F1910 Other psychoactive substance abuse, uncomplicated: Secondary | ICD-10-CM | POA: Diagnosis present

## 2011-07-29 DIAGNOSIS — E1010 Type 1 diabetes mellitus with ketoacidosis without coma: Secondary | ICD-10-CM | POA: Diagnosis not present

## 2011-07-29 DIAGNOSIS — F4310 Post-traumatic stress disorder, unspecified: Secondary | ICD-10-CM | POA: Diagnosis present

## 2011-07-29 DIAGNOSIS — R112 Nausea with vomiting, unspecified: Secondary | ICD-10-CM | POA: Diagnosis present

## 2011-07-29 DIAGNOSIS — F3189 Other bipolar disorder: Secondary | ICD-10-CM | POA: Diagnosis present

## 2011-07-29 DIAGNOSIS — E1110 Type 2 diabetes mellitus with ketoacidosis without coma: Secondary | ICD-10-CM | POA: Diagnosis not present

## 2011-07-29 DIAGNOSIS — F17200 Nicotine dependence, unspecified, uncomplicated: Secondary | ICD-10-CM | POA: Diagnosis present

## 2011-07-29 DIAGNOSIS — G40909 Epilepsy, unspecified, not intractable, without status epilepticus: Secondary | ICD-10-CM | POA: Diagnosis present

## 2011-07-29 LAB — GLUCOSE, CAPILLARY
Glucose-Capillary: 89 mg/dL (ref 70–99)
Glucose-Capillary: 128 mg/dL — ABNORMAL HIGH (ref 70–99)
Glucose-Capillary: 217 mg/dL — ABNORMAL HIGH (ref 70–99)
Glucose-Capillary: 131 mg/dL — ABNORMAL HIGH (ref 70–99)
Glucose-Capillary: 111 mg/dL — ABNORMAL HIGH (ref 70–99)
Glucose-Capillary: 141 mg/dL — ABNORMAL HIGH (ref 70–99)

## 2011-07-29 LAB — BASIC METABOLIC PANEL
CO2: 22 mEq/L (ref 19–32)
CO2: 21 mEq/L (ref 19–32)
CO2: 18 mEq/L — ABNORMAL LOW (ref 19–32)
Chloride: 107 mEq/L (ref 96–112)
Chloride: 105 mEq/L (ref 96–112)
Chloride: 105 mEq/L (ref 96–112)
GFR calc Af Amer: >90 mL/min (ref >90)
GFR calc non Af Amer: >90 mL/min (ref >90)
Glucose, Bld: 77 mg/dL (ref 70–99)
Glucose, Bld: 308 mg/dL — ABNORMAL HIGH (ref 70–99)
Potassium: 3.6 mEq/L (ref 3.5–5.1)
Potassium: 4.1 mEq/L (ref 3.5–5.1)
Potassium: 3.3 mEq/L — ABNORMAL LOW (ref 3.5–5.1)
Potassium: 3.6 mEq/L (ref 3.5–5.1)
Sodium: 136 mEq/L (ref 135–145)
Sodium: 136 mEq/L (ref 135–145)
Sodium: 131 mEq/L — ABNORMAL LOW (ref 135–145)

## 2011-07-29 LAB — HEMOGLOBIN A1C: Hgb A1c MFr Bld: 13.8 % — ABNORMAL HIGH (ref <5.7)

## 2011-07-29 LAB — CBC
Hemoglobin: 10.6 g/dL — ABNORMAL LOW (ref 12.0–15.0)
MCH: 24 pg — ABNORMAL LOW (ref 26.0–34.0)
MCHC: 32.1 g/dL (ref 30.0–36.0)

## 2011-07-29 LAB — MRSA PCR SCREENING: MRSA by PCR: NEGATIVE

## 2011-07-29 LAB — CARDIAC PANEL(CRET KIN+CKTOT+MB+TROPI)
CK, MB: 1.5 ng/mL (ref 0.3–4.0)
Relative Index: INVALID (ref 0.0–2.5)
Total CK: 33 U/L (ref 7–177)

## 2011-07-29 MED ORDER — ACETAMINOPHEN 325 MG PO TABS: 650.0000 mg | ORAL_TABLET | Freq: Four times a day (QID) | ORAL | Status: DC | PRN

## 2011-07-29 MED ORDER — ALUM & MAG HYDROXIDE-SIMETH 200-200-20 MG/5ML PO SUSP: 30.0000 mL | Freq: Four times a day (QID) | ORAL | Status: DC | PRN

## 2011-07-29 MED ORDER — INSULIN ASPART 100 UNIT/ML ~~LOC~~ SOLN
0.0000 [IU] | Freq: Three times a day (TID) | SUBCUTANEOUS | Status: DC
  Administered 2011-07-30: 2 [IU] via SUBCUTANEOUS
  Administered 2011-07-30: 9 [IU] via SUBCUTANEOUS
  Administered 2011-07-30: 5 [IU] via SUBCUTANEOUS
  Filled 2011-07-29: qty 3

## 2011-07-29 MED ORDER — ONDANSETRON HCL 4 MG PO TABS: 4.0000 mg | ORAL_TABLET | Freq: Four times a day (QID) | ORAL | Status: DC | PRN

## 2011-07-29 MED ORDER — DEXTROSE 50 % IV SOLN: 25.0000 mL | INTRAVENOUS | Status: DC | PRN

## 2011-07-29 MED ORDER — INSULIN GLARGINE 100 UNIT/ML ~~LOC~~ SOLN
50.0000 [IU] | Freq: Every day | SUBCUTANEOUS | Status: DC
  Administered 2011-07-29 – 2011-07-30 (×2): 50 [IU] via SUBCUTANEOUS
  Filled 2011-07-29: qty 3

## 2011-07-29 MED ORDER — DEXTROSE-NACL 5-0.45 % IV SOLN
INTRAVENOUS | Status: DC
  Administered 2011-07-29 – 2011-07-30 (×2): via INTRAVENOUS

## 2011-07-29 MED ORDER — SODIUM CHLORIDE 0.9 % IV SOLN
INTRAVENOUS | Status: DC
  Administered 2011-07-29: 1.6 [IU]/h via INTRAVENOUS
  Filled 2011-07-29 (×2): qty 1

## 2011-07-29 MED ORDER — INSULIN GLARGINE 100 UNIT/ML ~~LOC~~ SOLN
50.0000 [IU] | Freq: Every day | SUBCUTANEOUS | Status: DC
  Filled 2011-07-29: qty 3

## 2011-07-29 MED ORDER — INSULIN ASPART 100 UNIT/ML ~~LOC~~ SOLN
0.0000 [IU] | Freq: Every day | SUBCUTANEOUS | Status: DC
  Administered 2011-07-29: 3 [IU] via SUBCUTANEOUS

## 2011-07-29 MED ORDER — ONDANSETRON HCL 4 MG/2ML IJ SOLN: 4.0000 mg | Freq: Four times a day (QID) | INTRAMUSCULAR | Status: DC | PRN

## 2011-07-29 NOTE — ED Notes (Signed)
Lab tech unable to get blood for BMP. Dr. Kaylyn Layer aware. Pt to have BMP every 6 hrs instead of every 2 hours.

## 2011-07-29 NOTE — ED Notes (Signed)
RT at bedside for arterial blood draw for BMP.

## 2011-07-29 NOTE — Progress Notes (Signed)
CSW met with patient to discuss substance abuse treatment as patient tested positive for marijuana. Patient refuses to discuss with CSW as she states that she does not have a problem with marijuana. She denies needing CSW for anything. Psychosocial assessment completed and placed in shadow chart. No further CSW needs noted.  Marguerite Barba C. Shante Archambeault MSW, LCSW [phone]

## 2011-07-29 NOTE — Progress Notes (Signed)
   CARE MANAGEMENT NOTE 07/29/2011  Patient:  Mary [name], Mary [name]   Account Number:  [id number]  Date Initiated:  07/29/2011  Documentation initiated by:  Lanier Clam  Subjective/Objective Assessment:   ADMITTED W/DKA.REAMDIT-DKA-NON COMPLIANCE W/MEDS.     Action/Plan:   FROM HOME.   Anticipated DC Date:  07/30/2011   Anticipated DC Plan:  HOME/SELF CARE  In-house referral  Clinical Social Worker         Choice offered to / List presented to:             Status of service:  In process, will continue to follow Medicare Important Message given?   (If response is "NO", the following Medicare IM given date fields will be blank) Date Medicare IM given:   Date Additional Medicare IM given:    Discharge Disposition:  HOME/SELF CARE  Per UR Regulation:  Reviewed for med. necessity/level of care/duration of stay  Comments:  07/29/11 Susan Bleich RN,BSN NCM [phone] [facility] APPT SET ON F/U APPTS.ALREADY HAS ORANGE CARD.DOES NOT QUALIFY FOR INDIGENT FUNDS SINCE USED IN PAST YEAR.SAYS SHE CAN'T PAY THE $10 FOR INSULIN,ALREADY HAS PATIENT ASST FORM FOR INSULIN,SHE WILL TAKE IT TO [facility] UPON D/C.ALSO PROVIDED HER WITH MEDICATION ASST PROGRAM VIA [facility] TO SET UP APPT.SHE COMPREHENDS IMPORTANCE OF MED ADHERENCE,& PCP F/U. INFORMED CSW THAT TRANSP WILL BE NEEDED @ D/C.ALSO PROVIDED W[facility]/[employer] [facility] LIST.

## 2011-07-29 NOTE — Progress Notes (Signed)
PROGRESS NOTE  Mary [name] UXL:[id number] DOB: 02/02/1971 DOA: 07/28/2011 PCP: Georganna Skeans, MD, MD  Brief narrative: 41 year old woman presented to the emergency department with nausea, vomiting and diffuse abdominal pain. Ran out of Lantus one week ago. Has not been checking blood sugars. She cannot afford Lantus. Of note she has been hospitalized 6 times in the last 6 months for DKA.  By report in December of last year the patient has a "orange card" and is therefore able to get insulin, however reports cannot afford the $10.   Hospitalized 1/24-26, 2013: DKA, marijuana abuse, noncompliance. Reported running out of insulin prior to admission.  Hospitalized 1/11-1/13, 2013: DKA.  Hospitalized 12/26-12/27, 2012: DKA, noncompliance. Stated that she could not afford her 70/30 insulin and therefore ran out of that prior to admission.  Hospitalized 12/15-12/19, 2012: DKA, noncompliance. States she ran out of her insulin prior to admission.  Hospitalized 11/25-11/29, 2012: DKA.  Hospitalized 9/25-9/28, 2012: DKA, noncompliance. Forgot to take her insulin for a 2 days prior to admission.  Past medical history: Diabetes mellitus insulin-dependent, recurrent admissions for DKA secondary to medication noncompliance, hepatitis C, cirrhosis, seizure disorder, bipolar 2 disorder, PTSD, depression, hiatal hernia  Consultants:    Procedures:    Antibiotics:    Interim History: Chart reviewed in detail and summarized above. Anion gap this morning 18. Cardiac enzymes negative. Hemoglobin A1c 13.8 in January. Urine drug screen positive for marijuana.  Subjective: Feels okay. No nausea or vomiting. Pain resolved.  Objective: Filed Vitals:   07/28/11 2301 07/29/11 0147 07/29/11 0325 07/29/11 0400  BP: 111/72 106/63 98/64   Pulse: 98 85 69   Temp:   97.7 F (36.5 C) 97.7 F (36.5 C)  TempSrc:   Oral Oral  Resp: 18 15 9    Height:   5\' 4"  (1.626 m)   SpO2: 97% 97% 100%      Intake/Output Summary (Last 24 hours) at 07/29/11 0748 Last data filed at 07/29/11 0700  Gross per 24 hour  Intake 1234.1 ml  Output      0 ml  Net 1234.1 ml    Exam:   General:  Calm and comfortable.  Eyes: Pupils, irises, lids appear grossly normal.  ENT: Grossly normal hearing.  Cardiovascular: Regular rate and rhythm. No murmur, rub, gallop. No lower extremity edema.  Respiratory: Her to auscultation bilaterally. No wheezes, rales, rhonchi. Normal respiratory effort.  Abdomen: Soft, nontender, nondistended.  Skin: Appears unremarkable.  Data Reviewed: Basic Metabolic Panel:  Lab 07/29/11 5366 07/29/11 0143 07/28/11 1955 07/28/11 1340  NA 136 136 145 137  K 4.1 3.6 -- --  CL 107 106 109 96  CO2 15* 22 18* 16*  GLUCOSE 203* 77 209* 452*  BUN 13 12 13 15   CREATININE 0.45* 0.41* 0.37* 0.43*  CALCIUM 8.6 8.6 9.4 10.3  MG -- -- -- --  PHOS -- -- -- --   Liver Function Tests:  Lab 07/28/11 1825  AST 66*  ALT 55*  ALKPHOS 113  BILITOT 0.1*  PROT 7.3  ALBUMIN 3.6    Lab 07/28/11 1340  LIPASE 36  AMYLASE --   CBC:  Lab 07/29/11 0400 07/28/11 1340  WBC 12.3* 11.9*  NEUTROABS -- 9.9*  HGB 10.6* 13.5  HCT 33.0* 42.8  MCV 74.8* 76.4*  PLT PLATELETS APPEAR ADEQUATE 220   Cardiac Enzymes:  Lab 07/29/11 0440 07/28/11 1825  CKTOTAL 33 45  CKMB 1.5 1.6  CKMBINDEX -- --  TROPONINI <0.30 <0.30   CBG:  Lab  07/29/11 0603 07/29/11 0440 07/29/11 0320 07/29/11 0201 07/29/11 0100  GLUCAP 217* 205* 128* 79 89    Recent Results (from the past 240 hour(s))  MRSA PCR SCREENING     Status: Normal   Collection Time   07/29/11  3:32 AM      Component Value Range Status Comment   MRSA by PCR NEGATIVE  NEGATIVE  Final      Studies: Dg Chest 1 View  07/28/2011  *RADIOLOGY REPORT*  Clinical Data: Abdominal pain, diabetes  CHEST - 1 VIEW  Comparison: 04/27/2011  Findings: Cardiomediastinal silhouette is stable.  No acute infiltrate or pleural effusion.  No  pulmonary edema.  Bony thorax is stable.  IMPRESSION: No active disease.  No significant change.  Original Report Authenticated By: Natasha Mead, M.D.   Acute Abdominal Series  07/11/2011  *RADIOLOGY REPORT*  Clinical Data: Abdominal pain, nausea and vomiting  ACUTE ABDOMEN SERIES (ABDOMEN 2 VIEW & CHEST 1 VIEW)  Comparison: Plain films 04/25/2011  Findings: Normal cardiac silhouette.  Lungs are clear.  No free air beneath hemidiaphragms.  No dilated loops of large or small bowel.  No pathologic calcifications.  Sigmoid scoliosis noted. There is a moderate volume stool throughout the colon but is much improved compared to prior.  IMPRESSION:  1.  No acute cardiopulmonary findings. 2.  No evidence of bowel obstruction or intraperitoneal free air. 3.  Moderate volume stool throughout the colon is improved compared to prior.  Original Report Authenticated By: Genevive Bi, M.D.    Scheduled Meds:   . aspirin EC  81 mg Oral Daily  . lithium carbonate  600 mg Oral QHS  . magnesium gluconate  500 mg Oral BID  .  morphine injection  4 mg Intravenous Once  . ondansetron (ZOFRAN) IV  4 mg Intravenous Once  . pantoprazole  40 mg Oral Daily  . potassium chloride  10 mEq Intravenous Q1H  . QUEtiapine  50 mg Oral QHS  . sodium chloride  1,000 mL Intravenous Once  . DISCONTD:  HYDROmorphone (DILAUDID) injection  1 mg Intravenous Once  . DISCONTD: insulin regular  0-10 Units Intravenous TID WC   Continuous Infusions:   . dextrose 5 % and 0.45% NaCl    . insulin (NOVOLIN-R) infusion    . DISCONTD: sodium chloride Stopped (07/28/11 1810)  . DISCONTD: sodium chloride    . DISCONTD: dextrose 5 % and 0.45% NaCl 125 mL/hr at 07/29/11 0141  . DISCONTD: insulin (NOVOLIN-R) infusion 0.4 Units/hr (07/29/11 0203)     Assessment/Plan: 1. DKA: Secondary to noncompliance. Continue IV insulin until anion gap has closed. Appears stable for transfer to floor. Patient does have a "orange chart" however states she  cannot afford $10 per month for her Lantus. She has been hospitalized 6 times in the last 6 months for DKA and noncompliance has been frequently cited. We will again consult inpatient diabetes RN as well as nutrition for education, however risk of rehospitalization is very high. 2. Diabetes mellitus, insulin dependent, uncontrolled: Best option at this point is to continue Lantus as she can obtain this for $10 a month. Case management consult for assistance with  medications. 3. Noncompliance: As above. 4. Polysubstance abuse: Counsel cessation. Clinical social work consult. 5. History of hepatitis C: Stable. 6. Bipolar disorder/PTSD/depression: Stable. Continue quetiapine, lithium. 7. Seizure disorder: Stable.  Code Status: Full Family Communication:  Disposition Plan: Home when improved.   Brendia Sacks, MD  [facility] Pager [phone] 07/29/2011, 7:48 AM  LOS: 1 day

## 2011-07-29 NOTE — Plan of Care (Signed)
Problem: Food- and Nutrition-Related Knowledge Deficit (NB-1.1) Goal: Nutrition education Formal process to instruct or train a patient/client in a skill or to impart knowledge to help patients/clients voluntarily manage or modify food choices and eating behavior to maintain or improve health.  Outcome: Adequate for Discharge Pt has been educated by staff RDs several times in the past.  She fully understands her diabetes and how to manage.  Mrs. Duffy is very intelligent.  Pt has been able to demonstrate understanding of nutrition therapy related to DM, and states that she has been following these principles at home.  Pt states she struggles to afford medications and feels her admission is related to 'running out of her lantus.'  Again, encouraged pt to work with CSW on affording medications and budgeting so that she has access to her medications at all times.  Pt verbalizes understanding.  Expect fair compliance.  Encouraged pt to contact RD via nursing staff if further questions or concerns present.

## 2011-07-29 NOTE — Progress Notes (Signed)
Inpatient Diabetes Program Recommendations  AACE/ADA: New Consensus Statement on Inpatient Glycemic Control (2009)  Target Ranges:  Prepandial:   less than 140 mg/dL      Peak postprandial:   less than 180 mg/dL (1-2 hours)      Critically ill patients:  140 - 180 mg/dL   Reason for Visit: DKA Results for Mary [name], Mary [name] (MRN [id number]) as of 07/29/2011 17:06  Ref. Range 07/29/2011 06:56 07/29/2011 08:26 07/29/2011 09:35 07/29/2011 10:39 07/29/2011 11:12 07/29/2011 12:31 07/29/2011 13:35 07/29/2011 14:59  Glucose-Capillary Latest Range: 70-99 mg/dL 119 (H) 147 (H) 829 (H) 141 (H) 128 (H) 162 (H) 175 (H) 195 (H)     Inpatient Diabetes Program Recommendations Insulin - Basal: Lantus given at 1430 - GS to be discontinued Insulin - Meal Coverage: Will need meal coverage insulin when pt starts eating. - Novolog 6 units tidwc and titrate up as po intake increases  Note: Very familiar with pt from previous admissions.  States she just needs to turn in forms to [facility] in order to obtain prescriptions for insulin.  States she has a lot of stress in her life and it runs her blood sugars up.  Stressed again, importance of taking meds as prescribed.  Voices understanding but said she cannot afford copay of $10.  Social work and case management to followup.  Pt very irritable and doesn't want to talk about diabetes.  Will follow up in am.

## 2011-07-30 LAB — BASIC METABOLIC PANEL
CO2: 21 mEq/L (ref 19–32)
Glucose, Bld: 206 mg/dL — ABNORMAL HIGH (ref 70–99)
Potassium: 3.6 mEq/L (ref 3.5–5.1)
Sodium: 134 mEq/L — ABNORMAL LOW (ref 135–145)

## 2011-07-30 LAB — GLUCOSE, CAPILLARY
Glucose-Capillary: 288 mg/dL — ABNORMAL HIGH (ref 70–99)
Glucose-Capillary: 267 mg/dL — ABNORMAL HIGH (ref 70–99)
Glucose-Capillary: 169 mg/dL — ABNORMAL HIGH (ref 70–99)

## 2011-07-30 LAB — GLUCOSE, RANDOM: Glucose, Bld: 314 mg/dL — ABNORMAL HIGH (ref 70–99)

## 2011-07-30 MED ORDER — INFLUENZA VIRUS VACC SPLIT PF IM SUSP
0.5000 mL | INTRAMUSCULAR | Status: DC
  Filled 2011-07-30: qty 0.5

## 2011-07-30 MED ORDER — INSULIN ASPART 100 UNIT/ML ~~LOC~~ SOLN: 6.0000 [IU] | Freq: Three times a day (TID) | SUBCUTANEOUS | Status: DC

## 2011-07-30 MED ORDER — INSULIN GLARGINE 100 UNIT/ML ~~LOC~~ SOLN: 50.0000 [IU] | Freq: Every day | SUBCUTANEOUS | Status: DC

## 2011-07-30 MED ORDER — INSULIN ASPART 100 UNIT/ML ~~LOC~~ SOLN
6.0000 [IU] | Freq: Three times a day (TID) | SUBCUTANEOUS | Status: DC
  Administered 2011-07-30: 6 [IU] via SUBCUTANEOUS

## 2011-07-30 NOTE — Progress Notes (Signed)
Pt provided with d/c instructions including s/s hypo/hyper glycemia, stressed importance of taking medications, f/u appts, when to call MD---pt verbalized understanding and asked appropriate questions.  Belongings packed. Awaiting ride for pick up home.  Anisah Kuck, Theresia Lo

## 2011-07-30 NOTE — Discharge Instructions (Signed)
Blood Sugar Monitoring, Adult GLUCOSE METERS FOR SELF-MONITORING OF BLOOD GLUCOSE  It is important to be able to correctly measure your blood sugar (glucose). You can use a blood glucose monitor (a small battery-operated device) to check your glucose level at any time. This allows you and your caregiver to monitor your diabetes and to determine how well your treatment plan is working. The process of monitoring your blood glucose with a glucose meter is called self-monitoring of blood glucose (SMBG). When people with diabetes control their blood sugar, they have better health. To test for glucose with a typical glucose meter, place the disposable strip in the meter. Then place a small sample of blood on the "test strip." The test strip is coated with chemicals that combine with glucose in blood. The meter measures how much glucose is present. The meter displays the glucose level as a number. Several new models can record and store a number of test results. Some models can connect to personal computers to store test results or print them out.  Newer meters are often easier to use than older models. Some meters allow you to get blood from places other than your fingertip. Some new models have automatic timing, error codes, signals, or barcode readers to help with proper adjustment (calibration). Some meters have a large display screen or spoken instructions for people with visual impairments.  INSTRUCTIONS FOR USING GLUCOSE METERS  Wash your hands with soap and warm water, or clean the area with alcohol. Dry your hands completely.   Prick the side of your fingertip with a lancet (a sharp-pointed tool used by hand).   Hold the hand down and gently milk the finger until a small drop of blood appears. Catch the blood with the test strip.   Follow the instructions for inserting the test strip and using the SMBG meter. Most meters require the meter to be turned on and the test strip to be inserted before  applying the blood sample.   Record the test result.   Read the instructions carefully for both the meter and the test strips that go with it. Meter instructions are found in the user manual. Keep this manual to help you solve any problems that may arise. Many meters use "error codes" when there is a problem with the meter, the test strip, or the blood sample on the strip. You will need the manual to understand these error codes and fix the problem.   New devices are available such as laser lancets and meters that can test blood taken from "alternative sites" of the body, other than fingertips. However, you should use standard fingertip testing if your glucose changes rapidly. Also, use standard testing if:   You have eaten, exercised, or taken insulin in the past 2 hours.   You think your glucose is low.   You tend to not feel symptoms of low blood glucose (hypoglycemia).   You are ill or under stress.   Clean the meter as directed by the manufacturer.   Test the meter for accuracy as directed by the manufacturer.   Take your meter with you to your caregiver's office. This way, you can test your glucose in front of your caregiver to make sure you are using the meter correctly. Your caregiver can also take a sample of blood to test using a routine lab method. If values on the glucose meter are close to the lab results, you and your caregiver will see that your meter is working well  and you are using good technique. Your caregiver will advise you about what to do if the results do not match.  FREQUENCY OF TESTING  Your caregiver will tell you how often you should check your blood glucose. This will depend on your type of diabetes, your current level of diabetes control, and your types of medicines. The following are general guidelines, but your care plan may be different. Record all your readings and the time of day you took them for review with your caregiver.   Diabetes type 1.   When you  are using insulin with good diabetic control (either multiple daily injections or via a pump), you should check your glucose 4 times a day.   If your diabetes is not well controlled, you may need to monitor more frequently, including before meals and 2 hours after meals, at bedtime, and occasionally between 2 a.m. and 3 a.m.   You should always check your glucose before a dose of insulin or before changing the rate on your insulin pump.   Diabetes type 2.   Guidelines for SMBG in diabetes type 2 are not as well defined.   If you are on insulin, follow the guidelines above.   If you are on medicines, but not insulin, and your glucose is not well controlled, you should test at least twice daily.   If you are not on insulin, and your diabetes is controlled with medicines or diet alone, you should test at least once daily, usually before breakfast.   A weekly profile will help your caregiver advise you on your care plan. The week before your visit, check your glucose before a meal and 2 hours after a meal at least daily. You may want to test before and after a different meal each day so you and your caregiver can tell how well controlled your blood sugars are throughout the course of a 24 hour period.   Gestational diabetes (diabetes during pregnancy).   Frequent testing is often necessary. Accurate timing is important.   If you are not on insulin, check your glucose 4 times a day. Check it before breakfast and 1 hour after the start of each meal.   If you are on insulin, check your glucose 6 times a day. Check it before each meal and 1 hour after the first bite of each meal.   General guidelines.   More frequent testing is required at the start of insulin treatment. Your caregiver will instruct you.   Test your glucose any time you suspect you have low blood sugar (hypoglycemia).   You should test more often when you change medicines, when you have unusual stress or illness, or in other  unusual circumstances.  OTHER THINGS TO KNOW ABOUT GLUCOSE METERS  Measurement Range. Most glucose meters are able to read glucose levels over a broad range of values from as low as 0 to as high as 600 mg/dL. If you get an extremely high or low reading from your meter, you should first confirm it with another reading. Report very high or very low readings to your caregiver.   Whole Blood Glucose versus Plasma Glucose. Some older home glucose meters measure glucose in your whole blood. In a lab or when using some newer home glucose meters, the glucose is measured in your plasma (one component of blood). The difference can be important. It is important for you and your caregiver to know whether your meter gives its results as "whole blood equivalent" or "plasma  equivalent."   Display of High and Low Glucose Values. Part of learning how to operate a meter is understanding what the meter results mean. Know how high and low glucose concentrations are displayed on your meter.   Factors that Affect Glucose Meter Performance. The accuracy of your test results depends on many factors and varies depending on the brand and type of meter. These factors include:   Low red blood cell count (anemia).   Substances in your blood (such as uric acid, vitamin C, and others).   Environmental factors (temperature, humidity, altitude).   Name-brand versus generic test strips.   Calibration. Make sure your meter is set up properly. It is a good idea to do a calibration test with a control solution recommended by the manufacturer of your meter whenever you begin using a fresh bottle of test strips. This will help verify the accuracy of your meter.   Improperly stored, expired, or defective test strips. Keep your strips in a dry place with the lid on.   Soiled meter.   Inadequate blood sample.  NEW TECHNOLOGIES FOR GLUCOSE TESTING Alternative site testing Some glucose meters allow testing blood from alternative  sites. These include the:  Upper arm.   Forearm.   Base of the thumb.   Thigh.  Sampling blood from alternative sites may be desirable. However, it may have some limitations. Blood in the fingertips show changes in glucose levels more quickly than blood in other parts of the body. This means that alternative site test results may be different from fingertip test results, not because of the meter's ability to test accurately, but because the actual glucose concentration can be different.  Continuous Glucose Monitoring Devices to measure your blood glucose continuously are available, and others are in development. These methods can be more expensive than self-monitoring with a glucose meter. However, it is uncertain how effective and reliable these devices are. Your caregiver will advise you if this approach makes sense for you. IF BLOOD SUGARS ARE CONTROLLED, PEOPLE WITH DIABETES REMAIN HEALTHIER.  SMBG is an important part of the treatment plan of patients with diabetes mellitus. Below are reasons for using SMBG:   It confirms that your glucose is at a specific, healthy level.   It detects hypoglycemia and severe hyperglycemia.   It allows you and your caregiver to make adjustments in response to changes in lifestyle for individuals requiring medicine.   It determines the need for starting insulin therapy in temporary diabetes that happens during pregnancy (gestational diabetes).  Document Released: 06/06/2003 Document Revised: 02/14/2011 Document Reviewed: 09/27/2010 [facility] Patient Information 2012 [place], [place].Diabetes Meal Planning Guide The diabetes meal planning guide is a tool to help you plan your meals and snacks. It is important for people with diabetes to manage their blood glucose (sugar) levels. Choosing the right foods and the right amounts throughout your day will help control your blood glucose. Eating right can even help you improve your blood pressure and reach or  maintain a healthy weight. CARBOHYDRATE COUNTING MADE EASY When you eat carbohydrates, they turn to sugar. This raises your blood glucose level. Counting carbohydrates can help you control this level so you feel better. When you plan your meals by counting carbohydrates, you can have more flexibility in what you eat and balance your medicine with your food intake. Carbohydrate counting simply means adding up the total amount of carbohydrate grams in your meals and snacks. Try to eat about the same amount at each meal. Foods with  carbohydrates are listed below. Each portion below is 1 carbohydrate serving or 15 grams of carbohydrates. Ask your dietician how many grams of carbohydrates you should eat at each meal or snack. Grains and Starches  1 slice bread.    English muffin or hotdog/hamburger bun.    cup cold cereal (unsweetened).   ? cup cooked pasta or rice.    cup starchy vegetables (corn, potatoes, peas, beans, winter squash).   1 tortilla (6 inches).    bagel.   1 waffle or pancake (size of a CD).    cup cooked cereal.   4 to 6 small crackers.  *Whole grain is recommended. Fruit  1 cup fresh unsweetened berries, melon, papaya, pineapple.   1 small fresh fruit.    banana or mango.    cup fruit juice (4 oz unsweetened).    cup canned fruit in natural juice or water.   2 tbs dried fruit.   12 to 15 grapes or cherries.  Milk and Yogurt  1 cup fat-free or 1% milk.   1 cup soy milk.   6 oz light yogurt with sugar-free sweetener.   6 oz low-fat soy yogurt.   6 oz plain yogurt.  Vegetables  1 cup raw or  cup cooked is counted as 0 carbohydrates or a "free" food.   If you eat 3 or more servings at 1 meal, count them as 1 carbohydrate serving.  Other Carbohydrates   oz chips or pretzels.    cup ice cream or frozen yogurt.    cup sherbet or sorbet.   2 inch square cake, no frosting.   1 tbs honey, sugar, jam, jelly, or syrup.   2 small  cookies.   3 squares of graham crackers.   3 cups popcorn.   6 crackers.   1 cup broth-based soup.   Count 1 cup casserole or other mixed foods as 2 carbohydrate servings.   Foods with less than 20 calories in a serving may be counted as 0 carbohydrates or a "free" food.  You may want to purchase a book or computer software that lists the carbohydrate gram counts of different foods. In addition, the nutrition facts panel on the labels of the foods you eat are a good source of this information. The label will tell you how big the serving size is and the total number of carbohydrate grams you will be eating per serving. Divide this number by 15 to obtain the number of carbohydrate servings in a portion. Remember, 1 carbohydrate serving equals 15 grams of carbohydrate. SERVING SIZES Measuring foods and serving sizes helps you make sure you are getting the right amount of food. The list below tells how big or small some common serving sizes are.  1 oz.........4 stacked dice.   3 oz........Marland KitchenDeck of cards.   1 tsp.......Mar[place] KitchenTip of little finger.   1 tbs......Marland KitchenMarland KitchenThumb.   2 tbs.......Marland KitchenGolf ball.    cup......Marland KitchenHalf of a fist.   1 cup.......Marland KitchenA fist.  SAMPLE DIABETES MEAL PLAN Below is a sample meal plan that includes foods from the grain and starches, dairy, vegetable, fruit, and meat groups. A dietician can individualize a meal plan to fit your calorie needs and tell you the number of servings needed from each food group. However, controlling the total amount of carbohydrates in your meal or snack is more important than making sure you include all of the food groups at every meal. You may interchange carbohydrate containing foods (dairy, starches, and fruits). The meal plan  below is an example of a 2000 calorie diet using carbohydrate counting. This meal plan has 17 carbohydrate servings. Breakfast  1 cup oatmeal (2 carb servings).    cup light yogurt (1 carb serving).   1 cup blueberries  (1 carb serving).    cup almonds.  Snack  1 large apple (2 carb servings).   1 low-fat string cheese stick.  Lunch  Chicken breast salad.   1 cup spinach.    cup chopped tomatoes.   2 oz chicken breast, sliced.   2 tbs low-fat [place] dressing.   12 whole-wheat crackers (2 carb servings).   12 to 15 grapes (1 carb serving).   1 cup low-fat milk (1 carb serving).  Snack  1 cup carrots.    cup hummus (1 carb serving).  Dinner  3 oz broiled salmon.   1 cup brown rice (3 carb servings).  Snack  1  cups steamed broccoli (1 carb serving) drizzled with 1 tsp olive oil and lemon juice.   1 cup light pudding (2 carb servings).  DIABETES MEAL PLANNING WORKSHEET Your dietician can use this worksheet to help you decide how many servings of foods and what types of foods are right for you.  BREAKFAST Food Group and Servings / Carb Servings Grain/Starches __________________________________ Dairy __________________________________________ Vegetable ______________________________________ Fruit ___________________________________________ Meat __________________________________________ Fat ____________________________________________ LUNCH Food Group and Servings / Carb Servings Grain/Starches ___________________________________ Dairy ___________________________________________ Fruit ____________________________________________ Meat ___________________________________________ Fat _____________________________________________ Laural Golden Food Group and Servings / Carb Servings Grain/Starches ___________________________________ Dairy ___________________________________________ Fruit ____________________________________________ Meat ___________________________________________ Fat _____________________________________________ SNACKS Food Group and Servings / Carb Servings Grain/Starches ___________________________________ Dairy  ___________________________________________ Vegetable _______________________________________ Fruit ____________________________________________ Meat ___________________________________________ Fat _____________________________________________ DAILY TOTALS Starches _________________________ Vegetable ________________________ Fruit ____________________________ Dairy ____________________________ Meat ____________________________ Fat ______________________________ Document Released: 02/28/2005 Document Revised: 02/13/2011 Document Reviewed: 01/09/2009 [employer] Patient Information 2012 [place], [place].Diabetic Ketoacidosis Diabetic ketoacidosis (DKA) is a life-threatening complication of type 1 diabetes. It must be quickly recognized and treated. Treatment requires hospitalization. CAUSES  When there is no insulin in the body, glucose (sugar) cannot be used and the body breaks down fat for energy. When fat breaks down, acids (ketones) build up in the blood. Very high levels of glucose and high levels of acids lead to severe loss of body fluids (dehydration) and other dangerous chemical changes. This stresses your vital organs and can cause coma or death. SYMPTOMS   Tiredness (fatigue).   Weight loss.   Excessive thirst.   Ketones in the urine.   Lightheadedness.   Fruity or sweet smell on your breath.   Excessive urination.   Visual changes.   Confusion or irritability.   Feeling sick to your stomach (nauseous) or vomiting.   Rapid breathing.   Stomachache or belly (abdominal) pain.  DIAGNOSIS  Your caregiver will diagnose DKA based on your history, physical exam, and blood tests. Your caregiver will check if there is another illness present which caused you to go into DKA. Most of this will be done quickly in an emergency room. TREATMENT   Fluid replacement to correct dehydration.   Insulin.   Correction of electrolytes, such as potassium and sodium.   Medicines  (antibiotics) that kill germs for infections.  PREVENTION  Always take your insulin. Do not skip your insulin injections.   If you are ill, treat yourself quickly. Your body often needs more insulin to fight the illness.   Check your blood glucose regularly.   Check urine ketones if your blood glucose is greater than  240 milligrams per deciliter (mg/dl).   Do not used expired or outdated insulin.   If your blood glucose is high, drink plenty of fluids. This helps flush out ketones.  HOME CARE INSTRUCTIONS   If you are ill, follow the advice of your caregiver.   To prevent loss of body fluids (dehydration), drink enough water and fluids to keep your urine clear or pale yellow.   If you cannot eat, alternate between drinking fluids with sugar (soda, juices, flavored gelatin) and salty fluids (broth, bouillon).   If you can eat, follow your usual diet and drink sugar-free liquids (water, diet drinks).   Always take your usual dose of insulin. If you cannot eat, or your glucose is getting too low, call your caregiver for further instructions.   Continue to monitor your blood or urine ketones every 3 to 4 hours around the clock. Set your alarm clock or have someone wake you up. If you are too sick, have someone test it for you.   Rest and avoid exercise.  SEEK MEDICAL CARE IF:   You have ketones in your urine or your blood glucose is higher than a level your caregiver suggests. You may need extra insulin. Call your caregiver if you need advice on adjusting your insulin.   You cannot drink at least a tablespoon of fluid every 15 to 20 minutes.   You have been throwing up for more than 2 hours.   You have symptoms of DKA:   Fruity smelling breath.   Breathing faster or slower.   Becoming very sleepy.  SEEK IMMEDIATE MEDICAL CARE IF:   You have signs of dehydration:   Decreased urination.   Increased thirst.   Dry skin and mouth.   Lightheadedness.   Your blood glucose  is very high (as advised by your caregiver) twice in a row.   You or your child has an oral temperature above 102 F (38.9 C), not controlled by medicine.   You pass out.   You have chest pain and/or trouble breathing.   You have a sudden, severe headache.   You have sudden weakness in one arm and/or one leg.   You have sudden difficulty speaking and/or swallowing.   You develop vomiting and/or diarrhea that is getting worse after 3 to 4 hours.   You have abdominal pain.  MAKE SURE YOU:   Understand these instructions.   Will watch your condition.   Will get help right away if you are not doing well or get worse.  Document Released: 05/31/2000 Document Revised: 02/13/2011 Document Reviewed: 12/07/2008 [facility] Patient Information 2012 [place], [place].Diabetes, Keeping Your Heart and Blood Vessels Healthy Too much glucose (sugar) in the blood for a long period of time can cause problems for those who have diabetes. This high blood glucose can damage many parts of the body, such as the heart, blood vessels, eyes, and kidneys. Heart and blood vessel disease can lead to heart attacks and strokes, which is the leading cause of death for people with diabetes. There are many things you can to do slow down or prevent the complications from diabetes. WHAT DO MY HEART AND BLOOD VESSELS DO? Your heart and blood vessels make up your circulatory system. Your heart is a big muscle that pumps blood through your body. Your heart pumps blood carrying oxygen to large blood vessels (arteries) and small blood vessels (capillaries). Other blood vessels, called veins, carry blood back to the heart. HOW DO MY BLOOD VESSELS GET CLOGGED?  Several things, including having diabetes, can make your blood cholesterol level too high. Cholesterol is a substance that is made by the body and used for many important functions. It is also found in some food that comes from animals. When cholesterol is too high, the  insides of large blood vessels become narrowed, even clogged. Narrowed and clogged blood vessels make it harder for enough blood to get to all parts of your body. This problem is called atherosclerosis. WHAT CAN HAPPEN WHEN BLOOD VESSELS ARE CLOGGED? When arteries become narrowed and clogged, you may have heart problems and are at increased risk for heart attack and stroke:  Chest pain (angina) causes pain in your chest, arms, shoulders, or back. You may feel the pain more when your heart beats faster, such as when you exercise. The pain may go away when you rest. You also may feel very weak and sweaty. If you do not get treatment, chest pain may happen more often. If diabetes has damaged the heart nerves, you may not feel the chest pain.   Heart attack. A heart attack happens when a blood vessel in or near the heart becomes blocked. Not enough blood can get to that part of the heart muscle so the area becomes oxygen deprived and the heart muscle may be permanently damaged.  WHAT ARE THE WARNING SIGNS OF A HEART ATTACK? You may have one or more of the following warning signs:  Chest pain or discomfort.   Pain or discomfort in your arms, back, jaw, or neck.   Indigestion or stomach pain.   Shortness of breath.   Sweating.   Nausea or vomiting.   Light-headedness.   You may have no warning signs at all, or they may come and go.  HOW DOES HEART DISEASE CAUSE HIGH BLOOD PRESSURE? Narrowed blood vessels leave a smaller opening for blood to flow through. It is like turning on a garden hose and holding your thumb over the opening. The smaller opening makes the water shoot out with more pressure. In the same way, narrowed blood vessels lead to high blood pressure. Other factors, such as kidney problems and being overweight, can also lead to high blood pressure. Many people with diabetes also have high blood pressure. If you have heart, eye, or kidney problems from diabetes, high blood pressure can  make them worse. If you have high blood pressure, ask your caregiver how to lower it. Your caregiver may be asked to take blood pressure medicine every day. Some types of blood pressure medicine can also help keep your kidneys healthy. To lower your blood pressure, your may be asked to lose weight; eat more fruits and vegetables; eat less salt and high-sodium foods, such as canned soups, luncheon meats, salty snack foods, and fast foods; and drink less alcohol. WHAT ARE THE WARNING SIGNS OF A STROKE? A stroke happens when part of your brain is not getting enough blood and stops working. Depending on the part of the brain that is damaged, a stroke can cause:  Sudden weakness or numbness of your face, arm, or leg on one side of your body.   Sudden confusion, trouble talking, or trouble understanding.   Sudden dizziness, loss of balance, or trouble walking.   Sudden trouble seeing in one or both eyes or sudden double vision.   Sudden severe headache.  Sometimes, one or more of these warning signs may happen and then disappear. You might be having a "mini-stroke," also called a TIA (transient ischemic attack).  If you have any of these warning signs, tell your caregiver right away. HOW CAN CLOGGED BLOOD VESSELS HURT MY LEGS AND FEET? Peripheral vascular disease can happen when the openings in your blood vessels become narrow and not enough blood gets to your legs and feet. You may feel pain in your buttocks, the back of your legs, or your thighs when you stand, walk, or exercise. Sometimes, surgery is necessary to treat this problem. WHAT CAN I DO TO KEEP MY BLOOD VESSELS HEALTHY AND PREVENT HEART DISEASE AND STROKE?  Keep your blood glucose under control. An A1c blood test will probably be ordered by your caregiver at least twice a year. The A1c test tells you your average blood glucose for the past 2 to 3 months and can give valuable information on the overall control of your diabetes.   Keep your  blood pressure under control. Have it checked at every health care visit. If you are on medication to control blood pressure, take it exactly as prescribed. The target for most people is below 130/80.   Keep your cholesterol under control. Have it checked at least once a year. The targets for most people are:   LDL (bad) cholesterol: below 100 mg/dl.   HDL (good) cholesterol: above 40 mg/dl in men and above 50 mg/dl in women.   Triglycerides (another type of fat in the blood): below 150 mg/dl.   Make physical activity a part of your daily routine. Aim for at least 30 minutes of exercise most days of the week. Check with your caregiver to learn what activities are best for you.   Make sure that the foods you eat are "heart-healthy." Include foods high in fiber, such as oat bran, oatmeal, whole-grain breads and cereals, fruits, and vegetables. Cut back on foods high in saturated fat or cholesterol, such as meats, butter, dairy products with fat, eggs, shortening, lard, and foods with palm oil or coconut oil.   Maintain a healthy weight. If you are overweight, try to exercise most days of the week. See a registered dietitian for help in planning meals and lowering the fat and calorie content.   If you smoke, QUIT. Your caregiver can tell you about ways to help you quit smoking.   Ask your caregiver whether you should take an aspirin every day. Studies have shown that taking a low dose of aspirin every day can help reduce your risk of heart disease and stroke.   Take your medicines as directed.  SEEK MEDICAL CARE IF:   You have any of the warning signs of a heart attack or stroke.   If you have pain in your legs or feet when walking.   If your feet and legs are cool or cold to the touch.  FOR MORE INFORMATION   Diabetes educators (nurses, dietitians, pharmacists, and other health professionals).   To find a diabetes educator near you, call the American Association of Diabetes Educators  (AADE) toll-free at 1-800-TEAMUP4 276-667-4379), or look on the Internet at www.diabeteseducator.org and click on "Find a Diabetes Educator."   Dietitians.   To find a dietitian near you, call the American Dietetic Association toll-free at 973-702-7024, or look on the Internet at www.eatright.org and click on "Find a Nutrition Professional."   Government.   The National Heart, Lung, and Blood Institute (NHLBI) is part of the [employer]. To learn more about heart and blood vessel problems, write or call NHLBI Information Center, [id number], [place] [address], (  301) X7405464; or see PopSteam.is on the Internet.   To get more information about taking care of diabetes, contact:  National Diabetes Information Clearinghouse [address] [place], [place] [address] Phone: [phone] or [phone] Fax: [phone] Email: ndic@info .StageSync.si Internet: www.diabetes.StageSync.si National Diabetes Education Program [address] [place], [place] [address] Phone: [phone] Fax: [phone] Internet: CommunicationFinder.com.au American Diabetes Association [address] [place], [place] [address] Phone: [phone] Internet: www.diabetes.org [employer] [employer] [facility] [address] [place], [place] [address] Phone: [phone] Internet: www.jdrf.org Document Released: 06/06/2003 Document Revised: 02/13/2011 Document Reviewed: 11/11/2008 [facility] Patient Information 2012 [place], [place].

## 2011-07-30 NOTE — Discharge Summary (Signed)
Physician Discharge Summary  Mary [name] EAV:[id number] DOB: 1970-07-19 DOA: 07/28/2011  PCP: Georganna Skeans, MD, MD  Admit date: 07/28/2011 Discharge date: 07/30/2011  Discharge Diagnoses:  1. DKA, secondary to noncompliance 2. Diabetes mellitus, insulin-dependent, uncontrolled 3. Noncompliance 4. Polysubstance abuse  Discharge Condition: Improved  Disposition: Home  History of present illness:  41 year old woman presented to the emergency department with nausea, vomiting and diffuse abdominal pain. Ran out of Lantus one week ago. Has not been checking blood sugars. She cannot afford Lantus. Of note she has been hospitalized 6 times in the last 6 months for DKA.  By report in December of last year the patient has a "orange card" and is therefore able to get insulin, however reports cannot afford the $10.     Hospitalized 1/24-26, 2013: DKA, marijuana abuse, noncompliance. Reported running out of insulin prior to admission.   Hospitalized 1/11-1/13, 2013: DKA.   Hospitalized 12/26-12/27, 2012: DKA, noncompliance. Stated that she could not afford her 70/30 insulin and therefore ran out of that prior to admission.   Hospitalized 12/15-12/19, 2012: DKA, noncompliance. States she ran out of her insulin prior to admission.   Hospitalized 11/25-11/29, 2012: DKA.   Hospitalized 9/25-9/28, 2012: DKA, noncompliance. Forgot to take her insulin for a 2 days prior to admission.  Hospital Course:  Ms. Pangelinan was admitted to the step down unit and treated for DKA. Her condition rapidly improved and she was transferred to medical floor and resumed on her usual insulin regimen. She feels quite well is ready to go home. She will followup with [facility]. 1. DKA: Resolved. Secondary to noncompliance. Patient does have a "orange chart" however states she cannot afford $10 per month for her Lantus. She has been hospitalized 6 times in the last 6 months for DKA and noncompliance. She has again been  educated on diabetes but her risk of rehospitalization is very high. Case management has assisted as far as possible. Lantus as her cheapest option. 2. Diabetes mellitus, insulin dependent, uncontrolled: Best option at this point is to continue Lantus as she can obtain this for $10 a month.    3. Noncompliance: As above.  4. Polysubstance abuse: Counsel cessation. Refused to discuss with social work.  5. History of hepatitis C: Stable.  6. Bipolar disorder/PTSD/depression: Stable. Continue quetiapine, lithium.  7. Seizure disorder: Stable.  Discharge Instructions  Discharge Orders    Future Orders Please Complete By Expires   Diet Carb Modified      Activity as tolerated - No restrictions      Discharge instructions      Comments:   Please followup with primary care physician as directed.     Medication List  As of 07/30/2011  3:22 PM   TAKE these medications         aspirin EC 81 MG tablet   Take 81 mg by mouth daily.      insulin aspart 100 UNIT/ML injection   Commonly known as: novoLOG   Inject 6 Units into the skin 3 (three) times daily with meals.      insulin glargine 100 UNIT/ML injection   Commonly known as: LANTUS   Inject 50 Units into the skin at bedtime.      lithium carbonate 300 MG CR tablet   Commonly known as: LITHOBID   Take 600 mg by mouth at bedtime.      magnesium gluconate 500 MG tablet   Commonly known as: MAGONATE   Take 500 mg by mouth 2 (two)  times daily.      pantoprazole 40 MG tablet   Commonly known as: PROTONIX   Take 1 tablet (40 mg total) by mouth daily.      QUEtiapine 50 MG tablet   Commonly known as: SEROQUEL   Take 50 mg by mouth at bedtime.           Follow-up Information    Follow up with DR. [facility] on 09/10/2011. (9A)    Contact information:   [address] [place] [address] [phone]          The results of significant diagnostics from this hospitalization (including imaging, microbiology, ancillary and  laboratory) are listed below for reference.    Significant Diagnostic Studies: Dg Chest 1 View  07/28/2011  *RADIOLOGY REPORT*  Clinical Data: Abdominal pain, diabetes  CHEST - 1 VIEW  Comparison: 04/27/2011  Findings: Cardiomediastinal silhouette is stable.  No acute infiltrate or pleural effusion.  No pulmonary edema.  Bony thorax is stable.  IMPRESSION: No active disease.  No significant change.  Original Report Authenticated By: Natasha Mead, M.D.   Acute Abdominal Series  07/11/2011  *RADIOLOGY REPORT*  Clinical Data: Abdominal pain, nausea and vomiting  ACUTE ABDOMEN SERIES (ABDOMEN 2 VIEW & CHEST 1 VIEW)  Comparison: Plain films 04/25/2011  Findings: Normal cardiac silhouette.  Lungs are clear.  No free air beneath hemidiaphragms.  No dilated loops of large or small bowel.  No pathologic calcifications.  Sigmoid scoliosis noted. There is a moderate volume stool throughout the colon but is much improved compared to prior.  IMPRESSION:  1.  No acute cardiopulmonary findings. 2.  No evidence of bowel obstruction or intraperitoneal free air. 3.  Moderate volume stool throughout the colon is improved compared to prior.  Original Report Authenticated By: Genevive Bi, M.D.   Microbiology: Recent Results (from the past 240 hour(s))  MRSA PCR SCREENING     Status: Normal   Collection Time   07/29/11  3:32 AM      Component Value Range Status Comment   MRSA by PCR NEGATIVE  NEGATIVE  Final      Labs: Basic Metabolic Panel:  Lab 07/30/11 1914 07/30/11 0325 07/29/11 2255 07/29/11 1102 07/29/11 0433 07/29/11 0143  NA -- 134* 131* 136 136 136  K -- 3.6 3.6 -- -- --  CL -- 104 105 105 107 106  CO2 -- 21 18* 21 15* 22  GLUCOSE 314* 206* 308* 139* 203* --  BUN -- 9 10 10 13 12   CREATININE -- 0.51 0.43* 0.39* 0.45* 0.41*  CALCIUM -- 8.7 8.3* 8.8 8.6 8.6  MG -- -- -- -- -- --  PHOS -- -- -- -- -- --   Liver Function Tests:  Lab 07/28/11 1825  AST 66*  ALT 55*  ALKPHOS 113  BILITOT 0.1*    PROT 7.3  ALBUMIN 3.6    Lab 07/28/11 1340  LIPASE 36  AMYLASE --   CBC:  Lab 07/29/11 0400 07/28/11 1340  WBC 12.3* 11.9*  NEUTROABS -- 9.9*  HGB 10.6* 13.5  HCT 33.0* 42.8  MCV 74.8* 76.4*  PLT PLATELETS APPEAR ADEQUATE 220   Cardiac Enzymes:  Lab 07/29/11 0440 07/28/11 1825  CKTOTAL 33 45  CKMB 1.5 1.6  CKMBINDEX -- --  TROPONINI <0.30 <0.30   CBG:  Lab 07/30/11 1326 07/30/11 1145 07/30/11 0816 07/30/11 0409 07/30/11 0014  GLUCAP 300* 500* 267* 288* 281*    Time coordinating discharge: 20 minutes.  Signed:  Brendia Sacks, MD  [facility] 07/30/2011, 3:22 PM

## 2011-07-30 NOTE — Progress Notes (Signed)
Results for ONIKA, GUDIEL (MRN [id number]) as of 07/30/2011 14:35  Ref. Range 07/29/2011 22:55 07/30/2011 00:14 07/30/2011 03:25 07/30/2011 04:09 07/30/2011 08:16 07/30/2011 11:45 07/30/2011 13:15 07/30/2011 13:26  Glucose-Capillary Latest Range: 70-99 mg/dL  086 (H)  578 (H) 469 (H) 500 (H)  300 (H)  Glucose Latest Range: 70-99 mg/dL 629 (H)  528 (H)    413 (H)     Pt states she feels better today.  Eating well and has viewed diabetes videos on pt ed channel.  Needs meal coverage insulin!!  Please add Novolog 6 units tidwc and titrate up until CBGs are < 150 mg/dL.

## 2011-07-30 NOTE — Plan of Care (Signed)
Problem: Phase I Progression Outcomes Goal: Order "Choose to Live" book from Pharmacy Outcome: Not Applicable Date Met:  07/30/11 Pt declined

## 2011-07-30 NOTE — Progress Notes (Signed)
Pt's CBG at 1145 was 500.  A STAT blood glucose was ordered per protocol. 9 units of insulin administered.   Lab had difficulty obtaining sample after 2 sticks, results still pending at this time.  CBG rechecked and was 300.  MD notified, no new orders at this time.    Pt has watched 3 diabetes educational videos so far today.

## 2011-07-30 NOTE — Progress Notes (Signed)
PROGRESS NOTE  Mary [name] ZOX:[id number] DOB: 03-17-1971 DOA: 07/28/2011 PCP: Georganna Skeans, MD, MD  Brief narrative: 41 year old woman presented to the emergency department with nausea, vomiting and diffuse abdominal pain. Ran out of Lantus one week ago. Has not been checking blood sugars. She cannot afford Lantus. Of note she has been hospitalized 6 times in the last 6 months for DKA.  By report in December of last year the patient has a "orange card" and is therefore able to get insulin, however reports cannot afford the $10.   Hospitalized 1/24-26, 2013: DKA, marijuana abuse, noncompliance. Reported running out of insulin prior to admission.  Hospitalized 1/11-1/13, 2013: DKA.  Hospitalized 12/26-12/27, 2012: DKA, noncompliance. Stated that she could not afford her 70/30 insulin and therefore ran out of that prior to admission.  Hospitalized 12/15-12/19, 2012: DKA, noncompliance. States she ran out of her insulin prior to admission.  Hospitalized 11/25-11/29, 2012: DKA.  Hospitalized 9/25-9/28, 2012: DKA, noncompliance. Forgot to take her insulin for a 2 days prior to admission.  Past medical history: Diabetes mellitus insulin-dependent, recurrent admissions for DKA secondary to medication noncompliance, hepatitis C, cirrhosis, seizure disorder, bipolar 2 disorder, PTSD, depression, hiatal hernia  Interim History: Interval documentation reviewed.  Subjective: Feels well. Eating well. No nausea or vomiting.  Objective: Filed Vitals:   07/29/11 2126 07/30/11 0208 07/30/11 0454 07/30/11 1001  BP: 97/64 92/65 98/63  115/77  Pulse: 76 62 66 74  Temp: 98.5 F (36.9 C) 97.7 F (36.5 C) 97.8 F (36.6 C) 98.6 F (37 C)  TempSrc: Oral Oral Oral Oral  Resp: 18 16 16 18   Height:      Weight:      SpO2: 100% 98% 98% 97%    Intake/Output Summary (Last 24 hours) at 07/30/11 1446 Last data filed at 07/30/11 1423  Gross per 24 hour  Intake 5996.67 ml  Output      0 ml  Net  5996.67 ml    Exam:   General:  Calm and comfortable.  Cardiovascular: Regular rate and rhythm. No murmur, rub, gallop. No lower extremity edema.  Respiratory: Her to auscultation bilaterally. No wheezes, rales, rhonchi. Normal respiratory effort.  Data Reviewed: Basic Metabolic Panel:  Lab 07/30/11 8119 07/30/11 0325 07/29/11 2255 07/29/11 1102 07/29/11 0433 07/29/11 0143  NA -- 134* 131* 136 136 136  K -- 3.6 3.6 -- -- --  CL -- 104 105 105 107 106  CO2 -- 21 18* 21 15* 22  GLUCOSE 314* 206* 308* 139* 203* --  BUN -- 9 10 10 13 12   CREATININE -- 0.51 0.43* 0.39* 0.45* 0.41*  CALCIUM -- 8.7 8.3* 8.8 8.6 8.6  MG -- -- -- -- -- --  PHOS -- -- -- -- -- --   Liver Function Tests:  Lab 07/28/11 1825  AST 66*  ALT 55*  ALKPHOS 113  BILITOT 0.1*  PROT 7.3  ALBUMIN 3.6    Lab 07/28/11 1340  LIPASE 36  AMYLASE --   CBC:  Lab 07/29/11 0400 07/28/11 1340  WBC 12.3* 11.9*  NEUTROABS -- 9.9*  HGB 10.6* 13.5  HCT 33.0* 42.8  MCV 74.8* 76.4*  PLT PLATELETS APPEAR ADEQUATE 220   Cardiac Enzymes:  Lab 07/29/11 0440 07/28/11 1825  CKTOTAL 33 45  CKMB 1.5 1.6  CKMBINDEX -- --  TROPONINI <0.30 <0.30   CBG:  Lab 07/30/11 1326 07/30/11 1145 07/30/11 0816 07/30/11 0409 07/30/11 0014  GLUCAP 300* 500* 267* 288* 281*    Recent Results (from the  past 240 hour(s))  MRSA PCR SCREENING     Status: Normal   Collection Time   07/29/11  3:32 AM      Component Value Range Status Comment   MRSA by PCR NEGATIVE  NEGATIVE  Final      Studies: Dg Chest 1 View  07/28/2011  *RADIOLOGY REPORT*  Clinical Data: Abdominal pain, diabetes  CHEST - 1 VIEW  Comparison: 04/27/2011  Findings: Cardiomediastinal silhouette is stable.  No acute infiltrate or pleural effusion.  No pulmonary edema.  Bony thorax is stable.  IMPRESSION: No active disease.  No significant change.  Original Report Authenticated By: Natasha Mead, M.D.   Acute Abdominal Series  07/11/2011  *RADIOLOGY REPORT*   Clinical Data: Abdominal pain, nausea and vomiting  ACUTE ABDOMEN SERIES (ABDOMEN 2 VIEW & CHEST 1 VIEW)  Comparison: Plain films 04/25/2011  Findings: Normal cardiac silhouette.  Lungs are clear.  No free air beneath hemidiaphragms.  No dilated loops of large or small bowel.  No pathologic calcifications.  Sigmoid scoliosis noted. There is a moderate volume stool throughout the colon but is much improved compared to prior.  IMPRESSION:  1.  No acute cardiopulmonary findings. 2.  No evidence of bowel obstruction or intraperitoneal free air. 3.  Moderate volume stool throughout the colon is improved compared to prior.  Original Report Authenticated By: Genevive Bi, M.D.    Scheduled Meds:    . aspirin EC  81 mg Oral Daily  . influenza  inactive virus vaccine  0.5 mL Intramuscular Tomorrow-1000  . insulin aspart  0-5 Units Subcutaneous QHS  . insulin aspart  0-9 Units Subcutaneous TID WC  . insulin glargine  50 Units Subcutaneous Daily  . lithium carbonate  600 mg Oral QHS  . magnesium gluconate  500 mg Oral BID  . pantoprazole  40 mg Oral Daily  . QUEtiapine  50 mg Oral QHS   Continuous Infusions:    . dextrose 5 % and 0.45% NaCl 100 mL/hr at 07/30/11 1423  . DISCONTD: insulin (NOVOLIN-R) infusion 1.2 Units/hr (07/29/11 1338)     Assessment/Plan: 1. DKA: Resolved. Secondary to noncompliance. Patient does have a "orange chart" however states she cannot afford $10 per month for her Lantus. She has been hospitalized 6 times in the last 6 months for DKA and noncompliance. She has again been educated on diabetes but her risk of rehospitalization is very high. Case management has consisted as far as possible. 2. Diabetes mellitus, insulin dependent, uncontrolled: Best option at this point is to continue Lantus as she can obtain this for $10 a month.  3. Noncompliance: As above. 4. Polysubstance abuse: Counsel cessation. Refused to discuss with social work. 5. History of hepatitis C:  Stable. 6. Bipolar disorder/PTSD/depression: Stable. Continue quetiapine, lithium. 7. Seizure disorder: Stable.  Code Status: Full Family Communication:  Disposition Plan: Home today.   Brendia Sacks, MD  [facility] Pager [phone] 07/30/2011, 2:46 PM    LOS: 2 days

## 2011-07-30 NOTE — Plan of Care (Signed)
Problem: Phase III Progression Outcomes Goal: CBGs stable on SQ insulin Outcome: Completed/Met Date Met:  07/30/11 Trending downward

## 2011-07-30 NOTE — Progress Notes (Signed)
Pt off glucomander.  Notified NP.  Received new orders.  Will continue to monitor. [name] 07/29/2011 20:00PM

## 2011-08-05 NOTE — ED Provider Notes (Addendum)
Patient with h.o. dka presents with noncompliance and abdominal pain.  Patient is noncooperative on my evaluation refusing to answer questions.  Abdomen appears soft and nontender but it is a poor exam due to patient's refusal to cooperate.  I performed a history and physical examination of Mary [name] and discussed her management   I agree with the history, physical, assessment, and plan of care, with the following exceptions: None  I was present for the following procedures: None Time Spent in Critical Care of the patient: None   Tonianne Fine Corlis Leak, MD 08/05/11 0981  Hilario Quarry, MD 09/05/11 1441  Hilario Quarry, MD 09/26/11 2123

## 2011-08-16 ENCOUNTER — Encounter (HOSPITAL_COMMUNITY): Payer: Self-pay | Admitting: Emergency Medicine

## 2011-08-16 DIAGNOSIS — B182 Chronic viral hepatitis C: Secondary | ICD-10-CM | POA: Diagnosis not present

## 2011-08-16 DIAGNOSIS — Z79899 Other long term (current) drug therapy: Secondary | ICD-10-CM | POA: Insufficient documentation

## 2011-08-16 DIAGNOSIS — F1210 Cannabis abuse, uncomplicated: Secondary | ICD-10-CM | POA: Diagnosis not present

## 2011-08-16 DIAGNOSIS — G40909 Epilepsy, unspecified, not intractable, without status epilepticus: Secondary | ICD-10-CM | POA: Insufficient documentation

## 2011-08-16 DIAGNOSIS — Z9119 Patient's noncompliance with other medical treatment and regimen: Secondary | ICD-10-CM | POA: Insufficient documentation

## 2011-08-16 DIAGNOSIS — F319 Bipolar disorder, unspecified: Secondary | ICD-10-CM | POA: Diagnosis not present

## 2011-08-16 DIAGNOSIS — Z794 Long term (current) use of insulin: Secondary | ICD-10-CM | POA: Insufficient documentation

## 2011-08-16 DIAGNOSIS — E1110 Type 2 diabetes mellitus with ketoacidosis without coma: Secondary | ICD-10-CM | POA: Diagnosis present

## 2011-08-16 DIAGNOSIS — R112 Nausea with vomiting, unspecified: Secondary | ICD-10-CM | POA: Diagnosis not present

## 2011-08-16 DIAGNOSIS — F17200 Nicotine dependence, unspecified, uncomplicated: Secondary | ICD-10-CM | POA: Insufficient documentation

## 2011-08-16 DIAGNOSIS — E1010 Type 1 diabetes mellitus with ketoacidosis without coma: Principal | ICD-10-CM | POA: Insufficient documentation

## 2011-08-16 DIAGNOSIS — Z91199 Patient's noncompliance with other medical treatment and regimen due to unspecified reason: Secondary | ICD-10-CM | POA: Insufficient documentation

## 2011-08-16 DIAGNOSIS — R109 Unspecified abdominal pain: Secondary | ICD-10-CM | POA: Insufficient documentation

## 2011-08-16 NOTE — ED Notes (Signed)
Per EMS pt. Is from home with complaint of abdominal pain which started this morning, pt. Is also a non compliant diabetic pt. With blood glucose of 260mg /dl.,

## 2011-08-17 ENCOUNTER — Emergency Department (HOSPITAL_COMMUNITY): Payer: Medicaid Other

## 2011-08-17 ENCOUNTER — Encounter (HOSPITAL_COMMUNITY): Payer: Self-pay | Admitting: Internal Medicine

## 2011-08-17 ENCOUNTER — Observation Stay (HOSPITAL_COMMUNITY)
Admission: EM | Admit: 2011-08-17 | Discharge: 2011-08-18 | Disposition: A | Discharge status: Home / Self Care | Payer: Medicaid Other | Attending: Internal Medicine | Admitting: Internal Medicine

## 2011-08-17 DIAGNOSIS — E1065 Type 1 diabetes mellitus with hyperglycemia: Secondary | ICD-10-CM | POA: Diagnosis present

## 2011-08-17 DIAGNOSIS — F3181 Bipolar II disorder: Secondary | ICD-10-CM | POA: Diagnosis present

## 2011-08-17 DIAGNOSIS — E1110 Type 2 diabetes mellitus with ketoacidosis without coma: Secondary | ICD-10-CM

## 2011-08-17 DIAGNOSIS — G40909 Epilepsy, unspecified, not intractable, without status epilepticus: Secondary | ICD-10-CM | POA: Diagnosis present

## 2011-08-17 DIAGNOSIS — Z9114 Patient's other noncompliance with medication regimen: Secondary | ICD-10-CM

## 2011-08-17 DIAGNOSIS — IMO0002 Reserved for concepts with insufficient information to code with codable children: Secondary | ICD-10-CM | POA: Diagnosis present

## 2011-08-17 DIAGNOSIS — Z91148 Patient's other noncompliance with medication regimen for other reason: Secondary | ICD-10-CM

## 2011-08-17 DIAGNOSIS — B182 Chronic viral hepatitis C: Secondary | ICD-10-CM | POA: Diagnosis present

## 2011-08-17 DIAGNOSIS — Z720 Tobacco use: Secondary | ICD-10-CM | POA: Diagnosis present

## 2011-08-17 LAB — GLUCOSE, CAPILLARY
Glucose-Capillary: 212 mg/dL — ABNORMAL HIGH (ref 70–99)
Glucose-Capillary: 251 mg/dL — ABNORMAL HIGH (ref 70–99)
Glucose-Capillary: 233 mg/dL — ABNORMAL HIGH (ref 70–99)
Glucose-Capillary: 84 mg/dL (ref 70–99)
Glucose-Capillary: 125 mg/dL — ABNORMAL HIGH (ref 70–99)
Glucose-Capillary: 165 mg/dL — ABNORMAL HIGH (ref 70–99)

## 2011-08-17 LAB — BASIC METABOLIC PANEL
BUN: 11 mg/dL (ref 6–23)
BUN: 11 mg/dL (ref 6–23)
CO2: 14 mEq/L — ABNORMAL LOW (ref 19–32)
CO2: 18 mEq/L — ABNORMAL LOW (ref 19–32)
CO2: 19 mEq/L (ref 19–32)
Calcium: 9.3 mg/dL (ref 8.4–10.5)
Chloride: 105 mEq/L (ref 96–112)
Chloride: 108 mEq/L (ref 96–112)
Chloride: 106 mEq/L (ref 96–112)
Creatinine, Ser: 0.37 mg/dL — ABNORMAL LOW (ref 0.50–1.10)
Creatinine, Ser: 0.37 mg/dL — ABNORMAL LOW (ref 0.50–1.10)
GFR calc Af Amer: >90 mL/min (ref >90)
GFR calc Af Amer: >90 mL/min (ref >90)
GFR calc Af Amer: >90 mL/min (ref >90)
GFR calc Af Amer: >90 mL/min (ref >90)
GFR calc non Af Amer: >90 mL/min (ref >90)
GFR calc non Af Amer: >90 mL/min (ref >90)
Glucose, Bld: 69 mg/dL — ABNORMAL LOW (ref 70–99)
Glucose, Bld: 168 mg/dL — ABNORMAL HIGH (ref 70–99)
Potassium: 3.5 mEq/L (ref 3.5–5.1)
Sodium: 139 mEq/L (ref 135–145)
Sodium: 141 mEq/L (ref 135–145)

## 2011-08-17 LAB — CBC
Platelets: 177 10*3/uL (ref 150–400)
RBC: 5.61 MIL/uL — ABNORMAL HIGH (ref 3.87–5.11)
RDW: 14 % (ref 11.5–15.5)
WBC: 11.8 10*3/uL — ABNORMAL HIGH (ref 4.0–10.5)

## 2011-08-17 LAB — BLOOD GAS, VENOUS
FIO2: 0.21 %
Patient temperature: 98.6
pH, Ven: 7.304 — ABNORMAL HIGH (ref 7.250–7.300)

## 2011-08-17 LAB — URINALYSIS, MICROSCOPIC ONLY
Ketones, ur: >80 mg/dL — AB
Leukocytes, UA: NEGATIVE
Nitrite: NEGATIVE
Protein, ur: NEGATIVE mg/dL
Urobilinogen, UA: 0.2 mg/dL (ref 0.0–1.0)

## 2011-08-17 LAB — BLOOD GAS, ARTERIAL
Acid-base deficit: 9.5 mmol/L — ABNORMAL HIGH (ref 0.0–2.0)
Bicarbonate: 16.4 mEq/L — ABNORMAL LOW (ref 20.0–24.0)
Drawn by: 308601
O2 Saturation: 73.3 %
pCO2 arterial: 37.3 mmHg (ref 35.0–45.0)
pO2, Arterial: 45 mmHg — ABNORMAL LOW (ref 80.0–100.0)

## 2011-08-17 LAB — MRSA PCR SCREENING: MRSA by PCR: NEGATIVE

## 2011-08-17 LAB — LIPASE, BLOOD: Lipase: 22 U/L (ref 11–59)

## 2011-08-17 LAB — HEPATIC FUNCTION PANEL
ALT: 41 U/L — ABNORMAL HIGH (ref 0–35)
Albumin: 3.8 g/dL (ref 3.5–5.2)
Alkaline Phosphatase: 88 U/L (ref 39–117)
Total Protein: 7.7 g/dL (ref 6.0–8.3)

## 2011-08-17 LAB — RAPID URINE DRUG SCREEN, HOSP PERFORMED
Amphetamines: NOT DETECTED
Benzodiazepines: NOT DETECTED
Opiates: POSITIVE — AB

## 2011-08-17 LAB — POCT I-STAT, CHEM 8
BUN: 12 mg/dL (ref 6–23)
Chloride: 108 mEq/L (ref 96–112)
Glucose, Bld: 356 mg/dL — ABNORMAL HIGH (ref 70–99)
HCT: 47 % — ABNORMAL HIGH (ref 36.0–46.0)
Potassium: 4.3 mEq/L (ref 3.5–5.1)

## 2011-08-17 MED ORDER — DEXTROSE-NACL 5-0.45 % IV SOLN
INTRAVENOUS | Status: DC
  Administered 2011-08-17: 11:00:00 via INTRAVENOUS

## 2011-08-17 MED ORDER — ALBUTEROL SULFATE (5 MG/ML) 0.5% IN NEBU: 2.5000 mg | INHALATION_SOLUTION | RESPIRATORY_TRACT | Status: DC | PRN

## 2011-08-17 MED ORDER — ACETAMINOPHEN 325 MG PO TABS: 650.0000 mg | ORAL_TABLET | Freq: Four times a day (QID) | ORAL | Status: DC | PRN

## 2011-08-17 MED ORDER — SODIUM CHLORIDE 0.9 % IV BOLUS (SEPSIS)
1000.0000 mL | Freq: Once | INTRAVENOUS | Status: AC
  Administered 2011-08-17: 1000 mL via INTRAVENOUS

## 2011-08-17 MED ORDER — PANTOPRAZOLE SODIUM 40 MG IV SOLR
40.0000 mg | INTRAVENOUS | Status: DC
  Administered 2011-08-17 – 2011-08-18 (×2): 40 mg via INTRAVENOUS
  Filled 2011-08-17 (×2): qty 40

## 2011-08-17 MED ORDER — MORPHINE SULFATE 4 MG/ML IJ SOLN
4.0000 mg | Freq: Once | INTRAMUSCULAR | Status: AC
  Administered 2011-08-17: 4 mg via INTRAVENOUS
  Filled 2011-08-17: qty 1

## 2011-08-17 MED ORDER — INSULIN ASPART 100 UNIT/ML ~~LOC~~ SOLN
SUBCUTANEOUS | Status: AC
  Filled 2011-08-17: qty 1

## 2011-08-17 MED ORDER — DEXTROSE 50 % IV SOLN
25.0000 mL | INTRAVENOUS | Status: DC | PRN
  Administered 2011-08-17: 17 mL via INTRAVENOUS
  Filled 2011-08-17: qty 50

## 2011-08-17 MED ORDER — SODIUM CHLORIDE 0.9 % IV SOLN
INTRAVENOUS | Status: DC
  Administered 2011-08-17: 08:00:00 via INTRAVENOUS

## 2011-08-17 MED ORDER — SODIUM CHLORIDE 0.9 % IV SOLN
INTRAVENOUS | Status: DC
  Administered 2011-08-17: 1.9 [IU]/h via INTRAVENOUS
  Filled 2011-08-17: qty 1

## 2011-08-17 MED ORDER — HYDROMORPHONE HCL PF 1 MG/ML IJ SOLN
1.0000 mg | INTRAMUSCULAR | Status: DC | PRN
  Administered 2011-08-17 (×2): 1 mg via INTRAVENOUS
  Filled 2011-08-17 (×2): qty 1

## 2011-08-17 MED ORDER — ONDANSETRON HCL 4 MG/2ML IJ SOLN
4.0000 mg | Freq: Once | INTRAMUSCULAR | Status: AC
  Administered 2011-08-17: 4 mg via INTRAVENOUS
  Filled 2011-08-17: qty 2

## 2011-08-17 MED ORDER — INSULIN ASPART 100 UNIT/ML ~~LOC~~ SOLN
0.0000 [IU] | Freq: Three times a day (TID) | SUBCUTANEOUS | Status: DC
Start: 2011-08-17 — End: 2011-08-18
  Administered 2011-08-17: 3 [IU] via SUBCUTANEOUS
  Administered 2011-08-18: 11 [IU] via SUBCUTANEOUS
  Administered 2011-08-18: 3 [IU] via SUBCUTANEOUS
  Filled 2011-08-17: qty 3

## 2011-08-17 MED ORDER — INSULIN ASPART 100 UNIT/ML ~~LOC~~ SOLN
3.0000 [IU] | Freq: Three times a day (TID) | SUBCUTANEOUS | Status: DC
  Administered 2011-08-18: 3 [IU] via SUBCUTANEOUS

## 2011-08-17 MED ORDER — INSULIN ASPART 100 UNIT/ML ~~LOC~~ SOLN
0.0000 [IU] | Freq: Every day | SUBCUTANEOUS | Status: DC
  Administered 2011-08-17: 3 [IU] via SUBCUTANEOUS

## 2011-08-17 MED ORDER — DEXTROSE-NACL 5-0.45 % IV SOLN
INTRAVENOUS | Status: DC
  Administered 2011-08-17: 12:00:00 via INTRAVENOUS
  Administered 2011-08-18: 125 mL via INTRAVENOUS

## 2011-08-17 MED ORDER — INSULIN GLARGINE 100 UNIT/ML ~~LOC~~ SOLN
25.0000 [IU] | Freq: Every day | SUBCUTANEOUS | Status: DC
  Administered 2011-08-17: 25 [IU] via SUBCUTANEOUS
  Filled 2011-08-17: qty 3

## 2011-08-17 MED ORDER — SODIUM CHLORIDE 0.9 % IV SOLN
INTRAVENOUS | Status: AC
  Administered 2011-08-17: 5.2 [IU]/h via INTRAVENOUS
  Filled 2011-08-17: qty 1

## 2011-08-17 MED ORDER — ONDANSETRON HCL 4 MG/2ML IJ SOLN
4.0000 mg | Freq: Four times a day (QID) | INTRAMUSCULAR | Status: DC | PRN
  Administered 2011-08-17: 4 mg via INTRAVENOUS
  Filled 2011-08-17: qty 2

## 2011-08-17 MED ORDER — POTASSIUM CHLORIDE CRYS ER 20 MEQ PO TBCR
40.0000 meq | EXTENDED_RELEASE_TABLET | Freq: Once | ORAL | Status: AC
  Administered 2011-08-17: 40 meq via ORAL
  Filled 2011-08-17: qty 2

## 2011-08-17 MED ORDER — POTASSIUM CHLORIDE 10 MEQ/100ML IV SOLN
10.0000 meq | INTRAVENOUS | Status: DC
  Filled 2011-08-17: qty 100

## 2011-08-17 MED ORDER — INSULIN REGULAR HUMAN 100 UNIT/ML IJ SOLN: 10.0000 [IU] | Freq: Once | INTRAMUSCULAR | Status: DC

## 2011-08-17 MED ORDER — ENOXAPARIN SODIUM 40 MG/0.4ML ~~LOC~~ SOLN
40.0000 mg | SUBCUTANEOUS | Status: DC
  Administered 2011-08-17 – 2011-08-18 (×2): 40 mg via SUBCUTANEOUS
  Filled 2011-08-17 (×2): qty 0.4

## 2011-08-17 MED ORDER — INSULIN ASPART 100 UNIT/ML ~~LOC~~ SOLN
10.0000 [IU] | Freq: Once | SUBCUTANEOUS | Status: AC
  Administered 2011-08-17: 10 [IU] via SUBCUTANEOUS

## 2011-08-17 MED ORDER — DEXTROSE 50 % IV SOLN: 25.0000 mL | INTRAVENOUS | Status: DC | PRN

## 2011-08-17 MED ORDER — SODIUM CHLORIDE 0.9 % IV SOLN: INTRAVENOUS | Status: DC

## 2011-08-17 NOTE — ED Provider Notes (Signed)
Results for orders placed during the hospital encounter of 08/17/11  CBC      Component Value Range   WBC 11.8 (*) 4.0 - 10.5 (K/uL)   RBC 5.61 (*) 3.87 - 5.11 (MIL/uL)   Hemoglobin 13.1  12.0 - 15.0 (g/dL)   HCT 16.1  09.6 - 04.5 (%)   MCV 75.0 (*) 78.0 - 100.0 (fL)   MCH 23.4 (*) 26.0 - 34.0 (pg)   MCHC 31.1  30.0 - 36.0 (g/dL)   RDW 40.9  81.1 - 91.4 (%)   Platelets 177  150 - 400 (K/uL)  URINE RAPID DRUG SCREEN (HOSP PERFORMED)      Component Value Range   Opiates POSITIVE (*) NONE DETECTED    Cocaine NONE DETECTED  NONE DETECTED    Benzodiazepines NONE DETECTED  NONE DETECTED    Amphetamines NONE DETECTED  NONE DETECTED    Tetrahydrocannabinol POSITIVE (*) NONE DETECTED    Barbiturates NONE DETECTED  NONE DETECTED   BLOOD GAS, ARTERIAL      Component Value Range   FIO2 0.21     Delivery systems ROOM AIR     pH, Arterial 7.266 (*) 7.350 - 7.400    pCO2 arterial 37.3  35.0 - 45.0 (mmHg)   pO2, Arterial 45.0 (*) 80.0 - 100.0 (mmHg)   Bicarbonate 16.4 (*) 20.0 - 24.0 (mEq/L)   TCO2 15.4  0 - 100 (mmol/L)   Acid-base deficit 9.5 (*) 0.0 - 2.0 (mmol/L)   O2 Saturation 73.3     Patient temperature 98.6     Collection site RIGHT BRACHIAL     Drawn by [id number]     Sample type ARTERIAL DRAW    POCT I-STAT, CHEM 8      Component Value Range   Sodium 141  135 - 145 (mEq/L)   Potassium 4.3  3.5 - 5.1 (mEq/L)   Chloride 108  96 - 112 (mEq/L)   BUN 12  6 - 23 (mg/dL)   Creatinine, Ser 2.13  0.50 - 1.10 (mg/dL)   Glucose, Bld 086 (*) 70 - 99 (mg/dL)   Calcium, Ion 5.78  4.69 - 1.32 (mmol/L)   TCO2 16  0 - 100 (mmol/L)   Hemoglobin 16.0 (*) 12.0 - 15.0 (g/dL)   HCT 62.9 (*) 52.8 - 46.0 (%)  GLUCOSE, CAPILLARY      Component Value Range   Glucose-Capillary 212 (*) 70 - 99 (mg/dL)  GLUCOSE, CAPILLARY      Component Value Range   Glucose-Capillary 194 (*) 70 - 99 (mg/dL)  BLOOD GAS, VENOUS      Component Value Range   FIO2 0.21     pH, Ven 7.304 (*) 7.250 - 7.300    pCO2, Ven  35.6 (*) 45.0 - 50.0 (mmHg)   pO2, Ven 53.4 (*) 30.0 - 45.0 (mmHg)   Bicarbonate 17.1 (*) 20.0 - 24.0 (mEq/L)   TCO2 15.8  0 - 100 (mmol/L)   Acid-base deficit 8.0 (*) 0.0 - 2.0 (mmol/L)   O2 Saturation 84.9     Patient temperature 98.6     Collection site VEIN     Drawn by COLLECTED BY NURSE     Sample type VEIN     Dg Chest 1 View  07/28/2011  *RADIOLOGY REPORT*  Clinical Data: Abdominal pain, diabetes  CHEST - 1 VIEW  Comparison: 04/27/2011  Findings: Cardiomediastinal silhouette is stable.  No acute infiltrate or pleural effusion.  No pulmonary edema.  Bony thorax is stable.  IMPRESSION: No active  disease.  No significant change.  Original Report Authenticated By: Natasha Mead, M.D.      6:30 am Pt care assumed from NP [facility]. Pt with hx IDDM, non-compliant with medications. In ED with hyperglycemia, abd pain, N/V. Initial labs with glucose 356, arterial pH 7.266, no electrolyte disturbance. At time of my assessment, VBG has returned with pH 7.304, glucose 194. Pt c/o abd pain returning, denies nausea at this time. A&O, in moderate distress. Lungs CTAB. Heart RRR. Abd soft, diffuse TTP. MAEW. Will order additional pain medication. Plan for PO trial, urinalysis to eval for ketones. Will re-assess need for possible obs vs inpt vs d/c based on findings.   7:30 AM Pt pain improved, but with ketones in urine. Plan for obs admit as she is improving but will need continued monitoring for DKA resolution.  Shaaron Adler, PA-C 08/17/11 1025

## 2011-08-17 NOTE — ED Notes (Signed)
Pt. Refused to urinate.  Ignores any question asked.  Informed RN of situation.
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
## 2011-08-17 NOTE — ED Notes (Signed)
[id number]<XB> Expected date:<BR> Expected time:11:48 PM<BR> Means of arrival:<BR> Comments:<BR> M50 - 40yoF hyperglycemic (260), abd pain

## 2011-08-17 NOTE — ED Notes (Signed)
Pt stated that she could not urinate. 

## 2011-08-17 NOTE — ED Notes (Signed)
Report received-airway intact-pt resting quietly-no s/s's of distress

## 2011-08-18 DIAGNOSIS — E1065 Type 1 diabetes mellitus with hyperglycemia: Secondary | ICD-10-CM | POA: Diagnosis present

## 2011-08-18 LAB — CBC
MCHC: 30.5 g/dL (ref 30.0–36.0)
RDW: 14.2 % (ref 11.5–15.5)

## 2011-08-18 LAB — GLUCOSE, CAPILLARY: Glucose-Capillary: 302 mg/dL — ABNORMAL HIGH (ref 70–99)

## 2011-08-18 LAB — COMPREHENSIVE METABOLIC PANEL
Albumin: 2.9 g/dL — ABNORMAL LOW (ref 3.5–5.2)
Alkaline Phosphatase: 76 U/L (ref 39–117)
BUN: 10 mg/dL (ref 6–23)
Potassium: 4 mEq/L (ref 3.5–5.1)
Sodium: 131 mEq/L — ABNORMAL LOW (ref 135–145)
Total Protein: 6 g/dL (ref 6.0–8.3)

## 2011-08-18 MED ORDER — QUETIAPINE FUMARATE 50 MG PO TABS: 50.0000 mg | ORAL_TABLET | Freq: Every day | ORAL | Status: DC

## 2011-08-18 MED ORDER — INSULIN GLARGINE 100 UNIT/ML ~~LOC~~ SOLN: 50.0000 [IU] | Freq: Every day | SUBCUTANEOUS | Status: DC

## 2011-08-18 MED ORDER — MAGNESIUM GLUCONATE 500 MG PO TABS: 500.0000 mg | ORAL_TABLET | Freq: Two times a day (BID) | ORAL | Status: DC

## 2011-08-18 MED ORDER — LITHIUM CARBONATE ER 300 MG PO TBCR: 600.0000 mg | EXTENDED_RELEASE_TABLET | Freq: Every day | ORAL | Status: DC

## 2011-08-18 MED ORDER — INSULIN ASPART 100 UNIT/ML ~~LOC~~ SOLN: 6.0000 [IU] | Freq: Three times a day (TID) | SUBCUTANEOUS | Status: DC

## 2011-08-18 MED ORDER — PANTOPRAZOLE SODIUM 40 MG PO TBEC: 40.0000 mg | DELAYED_RELEASE_TABLET | Freq: Every day | ORAL | Status: DC

## 2011-08-18 MED ORDER — INSULIN GLARGINE 100 UNIT/ML ~~LOC~~ SOLN
15.0000 [IU] | Freq: Once | SUBCUTANEOUS | Status: AC
  Administered 2011-08-18: 15 [IU] via SUBCUTANEOUS
  Filled 2011-08-18: qty 3

## 2011-08-18 MED ORDER — INSULIN GLARGINE 100 UNIT/ML ~~LOC~~ SOLN: 25.0000 [IU] | Freq: Once | SUBCUTANEOUS | Status: DC

## 2011-08-18 NOTE — ED Provider Notes (Signed)
History/physical exam/procedure(s) were performed by non-physician practitioner and as supervising physician I was immediately available for consultation/collaboration. I have reviewed all notes and am in agreement with care and plan.   Hilario Quarry, MD 08/18/11 [phone]

## 2011-08-18 NOTE — Progress Notes (Signed)
Patient to be discharged back home today per MD order.  Social work and Case management have seen patient prior to discharge.  All paperwork to be explained to and given to patient at time of discharge.  Will continue to monitor.

## 2011-08-18 NOTE — Progress Notes (Signed)
Cm spoke with the patient concerning Cm consult for medication assistance. Pt ineligible for indigent funds, recently received assistance with meds at [facility] less than a year ago. Pt explained ineligibilty. Pt states able to receive assistnace through [facility] with prescriptions. Pt given [email or web address] applications for LANTUS,NOVOLOG, and SEROQUEL. RN to give pt flexpens to discharge home with.    Leonie Green [phone]

## 2011-08-18 NOTE — Discharge Instructions (Signed)
Diabetic Ketoacidosis Diabetic ketoacidosis (DKA) is a life-threatening complication of type 1 diabetes. It must be quickly recognized and treated. Treatment requires hospitalization. CAUSES  When there is no insulin in the body, glucose (sugar) cannot be used and the body breaks down fat for energy. When fat breaks down, acids (ketones) build up in the blood. Very high levels of glucose and high levels of acids lead to severe loss of body fluids (dehydration) and other dangerous chemical changes. This stresses your vital organs and can cause coma or death. SYMPTOMS   Tiredness (fatigue).   Weight loss.   Excessive thirst.   Ketones in the urine.   Lightheadedness.   Fruity or sweet smell on your breath.   Excessive urination.   Visual changes.   Confusion or irritability.   Feeling sick to your stomach (nauseous) or vomiting.   Rapid breathing.   Stomachache or belly (abdominal) pain.  DIAGNOSIS  Your caregiver will diagnose DKA based on your history, physical exam, and blood tests. Your caregiver will check if there is another illness present which caused you to go into DKA. Most of this will be done quickly in an emergency room. TREATMENT   Fluid replacement to correct dehydration.   Insulin.   Correction of electrolytes, such as potassium and sodium.   Medicines (antibiotics) that kill germs for infections.  PREVENTION  Always take your insulin. Do not skip your insulin injections.   If you are ill, treat yourself quickly. Your body often needs more insulin to fight the illness.   Check your blood glucose regularly.   Check urine ketones if your blood glucose is greater than 240 milligrams per deciliter (mg/dl).   Do not used expired or outdated insulin.   If your blood glucose is high, drink plenty of fluids. This helps flush out ketones.  HOME CARE INSTRUCTIONS   If you are ill, follow the advice of your caregiver.   To prevent loss of body fluids  (dehydration), drink enough water and fluids to keep your urine clear or pale yellow.   If you cannot eat, alternate between drinking fluids with sugar (soda, juices, flavored gelatin) and salty fluids (broth, bouillon).   If you can eat, follow your usual diet and drink sugar-free liquids (water, diet drinks).   Always take your usual dose of insulin. If you cannot eat, or your glucose is getting too low, call your caregiver for further instructions.   Continue to monitor your blood or urine ketones every 3 to 4 hours around the clock. Set your alarm clock or have someone wake you up. If you are too sick, have someone test it for you.   Rest and avoid exercise.  SEEK MEDICAL CARE IF:   You have ketones in your urine or your blood glucose is higher than a level your caregiver suggests. You may need extra insulin. Call your caregiver if you need advice on adjusting your insulin.   You cannot drink at least a tablespoon of fluid every 15 to 20 minutes.   You have been throwing up for more than 2 hours.   You have symptoms of DKA:   Fruity smelling breath.   Breathing faster or slower.   Becoming very sleepy.  SEEK IMMEDIATE MEDICAL CARE IF:   You have signs of dehydration:   Decreased urination.   Increased thirst.   Dry skin and mouth.   Lightheadedness.   Your blood glucose is very high (as advised by your caregiver) twice in a row.     You or your child has an oral temperature above 102 F (38.9 C), not controlled by medicine.   You pass out.   You have chest pain and/or trouble breathing.   You have a sudden, severe headache.   You have sudden weakness in one arm and/or one leg.   You have sudden difficulty speaking and/or swallowing.   You develop vomiting and/or diarrhea that is getting worse after 3 to 4 hours.   You have abdominal pain.  MAKE SURE YOU:   Understand these instructions.   Will watch your condition.   Will get help right away if you are  not doing well or get worse.  Document Released: 05/31/2000 Document Revised: 05/23/2011 Document Reviewed: 12/07/2008 Exit[employer] Patient Information 2012 [employer].Blood Sugar Monitoring, Adult GLUCOSE METERS FOR SELF-MONITORING OF BLOOD GLUCOSE  It is important to be able to correctly measure your blood sugar (glucose). You can use a blood glucose monitor (a small battery-operated device) to check your glucose level at any time. This allows you and your caregiver to monitor your diabetes and to determine how well your treatment plan is working. The process of monitoring your blood glucose with a glucose meter is called self-monitoring of blood glucose (SMBG). When people with diabetes control their blood sugar, they have better health. To test for glucose with a typical glucose meter, place the disposable strip in the meter. Then place a small sample of blood on the "test strip." The test strip is coated with chemicals that combine with glucose in blood. The meter measures how much glucose is present. The meter displays the glucose level as a number. Several new models can record and store a number of test results. Some models can connect to personal computers to store test results or print them out.  Newer meters are often easier to use than older models. Some meters allow you to get blood from places other than your fingertip. Some new models have automatic timing, error codes, signals, or barcode readers to help with proper adjustment (calibration). Some meters have a large display screen or spoken instructions for people with visual impairments.  INSTRUCTIONS FOR USING GLUCOSE METERS  Wash your hands with soap and warm water, or clean the area with alcohol. Dry your hands completely.   Prick the side of your fingertip with a lancet (a sharp-pointed tool used by hand).   Hold the hand down and gently milk the finger until a small drop of blood appears. Catch the blood with the test strip.    Follow the instructions for inserting the test strip and using the SMBG meter. Most meters require the meter to be turned on and the test strip to be inserted before applying the blood sample.   Record the test result.   Read the instructions carefully for both the meter and the test strips that go with it. Meter instructions are found in the user manual. Keep this manual to help you solve any problems that may arise. Many meters use "error codes" when there is a problem with the meter, the test strip, or the blood sample on the strip. You will need the manual to understand these error codes and fix the problem.   New devices are available such as laser lancets and meters that can test blood taken from "alternative sites" of the body, other than fingertips. However, you should use standard fingertip testing if your glucose changes rapidly. Also, use standard testing if:   You have eaten, exercised, or taken   insulin in the past 2 hours.   You think your glucose is low.   You tend to not feel symptoms of low blood glucose (hypoglycemia).   You are ill or under stress.   Clean the meter as directed by the manufacturer.   Test the meter for accuracy as directed by the manufacturer.   Take your meter with you to your caregiver's office. This way, you can test your glucose in front of your caregiver to make sure you are using the meter correctly. Your caregiver can also take a sample of blood to test using a routine lab method. If values on the glucose meter are close to the lab results, you and your caregiver will see that your meter is working well and you are using good technique. Your caregiver will advise you about what to do if the results do not match.  FREQUENCY OF TESTING  Your caregiver will tell you how often you should check your blood glucose. This will depend on your type of diabetes, your current level of diabetes control, and your types of medicines. The following are general  guidelines, but your care plan may be different. Record all your readings and the time of day you took them for review with your caregiver.   Diabetes type 1.   When you are using insulin with good diabetic control (either multiple daily injections or via a pump), you should check your glucose 4 times a day.   If your diabetes is not well controlled, you may need to monitor more frequently, including before meals and 2 hours after meals, at bedtime, and occasionally between 2 a.m. and 3 a.m.   You should always check your glucose before a dose of insulin or before changing the rate on your insulin pump.   Diabetes type 2.   Guidelines for SMBG in diabetes type 2 are not as well defined.   If you are on insulin, follow the guidelines above.   If you are on medicines, but not insulin, and your glucose is not well controlled, you should test at least twice daily.   If you are not on insulin, and your diabetes is controlled with medicines or diet alone, you should test at least once daily, usually before breakfast.   A weekly profile will help your caregiver advise you on your care plan. The week before your visit, check your glucose before a meal and 2 hours after a meal at least daily. You may want to test before and after a different meal each day so you and your caregiver can tell how well controlled your blood sugars are throughout the course of a 24 hour period.   Gestational diabetes (diabetes during pregnancy).   Frequent testing is often necessary. Accurate timing is important.   If you are not on insulin, check your glucose 4 times a day. Check it before breakfast and 1 hour after the start of each meal.   If you are on insulin, check your glucose 6 times a day. Check it before each meal and 1 hour after the first bite of each meal.   General guidelines.   More frequent testing is required at the start of insulin treatment. Your caregiver will instruct you.   Test your glucose  any time you suspect you have low blood sugar (hypoglycemia).   You should test more often when you change medicines, when you have unusual stress or illness, or in other unusual circumstances.  OTHER THINGS TO KNOW ABOUT   GLUCOSE METERS  Measurement Range. Most glucose meters are able to read glucose levels over a broad range of values from as low as 0 to as high as 600 mg/dL. If you get an extremely high or low reading from your meter, you should first confirm it with another reading. Report very high or very low readings to your caregiver.   Whole Blood Glucose versus Plasma Glucose. Some older home glucose meters measure glucose in your whole blood. In a lab or when using some newer home glucose meters, the glucose is measured in your plasma (one component of blood). The difference can be important. It is important for you and your caregiver to know whether your meter gives its results as "whole blood equivalent" or "plasma equivalent."   Display of High and Low Glucose Values. Part of learning how to operate a meter is understanding what the meter results mean. Know how high and low glucose concentrations are displayed on your meter.   Factors that Affect Glucose Meter Performance. The accuracy of your test results depends on many factors and varies depending on the brand and type of meter. These factors include:   Low red blood cell count (anemia).   Substances in your blood (such as uric acid, vitamin C, and others).   Environmental factors (temperature, humidity, altitude).   Name-brand versus generic test strips.   Calibration. Make sure your meter is set up properly. It is a good idea to do a calibration test with a control solution recommended by the manufacturer of your meter whenever you begin using a fresh bottle of test strips. This will help verify the accuracy of your meter.   Improperly stored, expired, or defective test strips. Keep your strips in a dry place with the lid on.    Soiled meter.   Inadequate blood sample.  NEW TECHNOLOGIES FOR GLUCOSE TESTING Alternative site testing Some glucose meters allow testing blood from alternative sites. These include the:  Upper arm.   Forearm.   Base of the thumb.   Thigh.  Sampling blood from alternative sites may be desirable. However, it may have some limitations. Blood in the fingertips show changes in glucose levels more quickly than blood in other parts of the body. This means that alternative site test results may be different from fingertip test results, not because of the meter's ability to test accurately, but because the actual glucose concentration can be different.  Continuous Glucose Monitoring Devices to measure your blood glucose continuously are available, and others are in development. These methods can be more expensive than self-monitoring with a glucose meter. However, it is uncertain how effective and reliable these devices are. Your caregiver will advise you if this approach makes sense for you. IF BLOOD SUGARS ARE CONTROLLED, PEOPLE WITH DIABETES REMAIN HEALTHIER.  SMBG is an important part of the treatment plan of patients with diabetes mellitus. Below are reasons for using SMBG:   It confirms that your glucose is at a specific, healthy level.   It detects hypoglycemia and severe hyperglycemia.   It allows you and your caregiver to make adjustments in response to changes in lifestyle for individuals requiring medicine.   It determines the need for starting insulin therapy in temporary diabetes that happens during pregnancy (gestational diabetes).  Document Released: 06/06/2003 Document Revised: 05/23/2011 Document Reviewed: 09/27/2010 Exit[employer] Patient Information 2012 [employer]. 

## 2011-08-18 NOTE — ED Provider Notes (Signed)
History/physical exam/procedure(s) were performed by non-physician practitioner and as supervising physician I was immediately available for consultation/collaboration. I have reviewed all notes and am in agreement with care and plan.   Ranon Coven S Tamura Lasky, MD 08/18/11 0109 
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
## 2011-09-12 ENCOUNTER — Emergency Department (HOSPITAL_COMMUNITY): Payer: Medicaid Other

## 2011-09-12 ENCOUNTER — Encounter (HOSPITAL_COMMUNITY): Payer: Self-pay | Admitting: *Deleted

## 2011-09-12 ENCOUNTER — Emergency Department (HOSPITAL_COMMUNITY)
Admission: EM | Admit: 2011-09-12 | Discharge: 2011-09-12 | Disposition: A | Discharge status: Home / Self Care | Payer: Medicaid Other | Attending: Emergency Medicine | Admitting: Emergency Medicine

## 2011-09-12 DIAGNOSIS — Z8619 Personal history of other infectious and parasitic diseases: Secondary | ICD-10-CM | POA: Insufficient documentation

## 2011-09-12 DIAGNOSIS — R112 Nausea with vomiting, unspecified: Secondary | ICD-10-CM | POA: Diagnosis not present

## 2011-09-12 DIAGNOSIS — R10819 Abdominal tenderness, unspecified site: Secondary | ICD-10-CM | POA: Insufficient documentation

## 2011-09-12 DIAGNOSIS — Z794 Long term (current) use of insulin: Secondary | ICD-10-CM | POA: Insufficient documentation

## 2011-09-12 DIAGNOSIS — E119 Type 2 diabetes mellitus without complications: Secondary | ICD-10-CM | POA: Insufficient documentation

## 2011-09-12 DIAGNOSIS — R109 Unspecified abdominal pain: Secondary | ICD-10-CM | POA: Insufficient documentation

## 2011-09-12 DIAGNOSIS — R739 Hyperglycemia, unspecified: Secondary | ICD-10-CM

## 2011-09-12 LAB — COMPREHENSIVE METABOLIC PANEL
AST: 53 U/L — ABNORMAL HIGH (ref 0–37)
Albumin: 3.9 g/dL (ref 3.5–5.2)
Alkaline Phosphatase: 82 U/L (ref 39–117)
BUN: 12 mg/dL (ref 6–23)
Chloride: 104 mEq/L (ref 96–112)
Potassium: 4.3 mEq/L (ref 3.5–5.1)
Sodium: 141 mEq/L (ref 135–145)
Total Bilirubin: 0.3 mg/dL (ref 0.3–1.2)
Total Protein: 7.7 g/dL (ref 6.0–8.3)

## 2011-09-12 LAB — DIFFERENTIAL
Lymphs Abs: 2 10*3/uL (ref 0.7–4.0)
Monocytes Relative: 1 % — ABNORMAL LOW (ref 3–12)
Neutro Abs: 9.5 10*3/uL — ABNORMAL HIGH (ref 1.7–7.7)
Neutrophils Relative %: 81 % — ABNORMAL HIGH (ref 43–77)

## 2011-09-12 LAB — POCT I-STAT 3, VENOUS BLOOD GAS (G3P V)
Acid-base deficit: 1 mmol/L (ref 0.0–2.0)
pH, Ven: 7.372 — ABNORMAL HIGH (ref 7.250–7.300)

## 2011-09-12 LAB — KETONES, QUALITATIVE: Acetone, Bld: NEGATIVE

## 2011-09-12 LAB — URINALYSIS, ROUTINE W REFLEX MICROSCOPIC
Glucose, UA: >1000 mg/dL — AB
Hgb urine dipstick: NEGATIVE
Ketones, ur: >80 mg/dL — AB
Leukocytes, UA: NEGATIVE
pH: 6 (ref 5.0–8.0)

## 2011-09-12 LAB — GLUCOSE, CAPILLARY: Glucose-Capillary: 354 mg/dL — ABNORMAL HIGH (ref 70–99)

## 2011-09-12 LAB — PREGNANCY, URINE: Preg Test, Ur: NEGATIVE

## 2011-09-12 LAB — CBC
Hemoglobin: 14 g/dL (ref 12.0–15.0)
RBC: 5.97 MIL/uL — ABNORMAL HIGH (ref 3.87–5.11)

## 2011-09-12 LAB — LIPASE, BLOOD: Lipase: 23 U/L (ref 11–59)

## 2011-09-12 LAB — URINE MICROSCOPIC-ADD ON

## 2011-09-12 MED ORDER — HYDROMORPHONE HCL PF 1 MG/ML IJ SOLN
1.0000 mg | Freq: Once | INTRAMUSCULAR | Status: AC
  Administered 2011-09-12: 1 mg via INTRAVENOUS
  Filled 2011-09-12: qty 1

## 2011-09-12 MED ORDER — SODIUM CHLORIDE 0.9 % IV BOLUS (SEPSIS)
1000.0000 mL | Freq: Once | INTRAVENOUS | Status: AC
  Administered 2011-09-12: 1000 mL via INTRAVENOUS

## 2011-09-12 MED ORDER — ONDANSETRON HCL 4 MG/2ML IJ SOLN
INTRAMUSCULAR | Status: AC
  Filled 2011-09-12: qty 2

## 2011-09-12 MED ORDER — ONDANSETRON HCL 4 MG/2ML IJ SOLN
4.0000 mg | Freq: Once | INTRAMUSCULAR | Status: AC
  Administered 2011-09-12: 4 mg via INTRAVENOUS
  Filled 2011-09-12: qty 2

## 2011-09-12 MED ORDER — ONDANSETRON HCL 4 MG/2ML IJ SOLN
INTRAMUSCULAR | Status: AC
  Administered 2011-09-12: 14:00:00
  Filled 2011-09-12: qty 2

## 2011-09-12 MED ORDER — OXYCODONE-ACETAMINOPHEN 5-325 MG PO TABS
1.0000 | ORAL_TABLET | Freq: Once | ORAL | Status: AC
  Administered 2011-09-12: 1 via ORAL
  Filled 2011-09-12: qty 1

## 2011-09-12 MED ORDER — OXYCODONE-ACETAMINOPHEN 5-325 MG PO TABS: 1.0000 | ORAL_TABLET | ORAL | Status: DC | PRN

## 2011-09-12 MED ORDER — ONDANSETRON HCL 4 MG/2ML IJ SOLN
4.0000 mg | Freq: Once | INTRAMUSCULAR | Status: AC
  Administered 2011-09-12: 4 mg via INTRAVENOUS

## 2011-09-12 MED ORDER — ONDANSETRON HCL 4 MG PO TABS: 4.0000 mg | ORAL_TABLET | Freq: Three times a day (TID) | ORAL | Status: AC | PRN

## 2011-09-12 MED ORDER — PROMETHAZINE HCL 25 MG RE SUPP: 25.0000 mg | Freq: Four times a day (QID) | RECTAL | Status: DC | PRN

## 2011-09-12 MED ORDER — IOHEXOL 300 MG/ML  SOLN
80.0000 mL | Freq: Once | INTRAMUSCULAR | Status: AC | PRN
  Administered 2011-09-12: 80 mL via INTRAVENOUS

## 2011-09-12 MED ORDER — ONDANSETRON 4 MG PO TBDP
8.0000 mg | ORAL_TABLET | Freq: Once | ORAL | Status: AC
  Administered 2011-09-12: 8 mg via ORAL
  Filled 2011-09-12: qty 2

## 2011-09-12 NOTE — ED Provider Notes (Signed)
I saw and evaluated the patient, reviewed the resident's note and I agree with the findings and plan. Nausea, vomiting, abdominal pain.  Hx DM, noncompliance and DKA. No DKA today but AG 16, bicarb 21, pH normal. Abdomen diffusely tender, patient difficult to examine.  Glynn Octave, MD 09/12/11 1714

## 2011-09-12 NOTE — ED Notes (Signed)
Patient resting quitely on stretcher until approached by staff patient will cry out loud.

## 2011-09-12 NOTE — ED Notes (Signed)
Pt returned from CT °

## 2011-09-12 NOTE — ED Notes (Signed)
Pt called RN into room. Pt noted to be vomiting. Pt had just finished drinking 2 cups of contrast for CT.

## 2011-09-12 NOTE — ED Notes (Signed)
Pt refused to give [place] a urine and refuses to have a in and out cath performed.

## 2011-09-12 NOTE — ED Provider Notes (Signed)
5:29 PM Patient is in CDU holding for CT abdomen, pelvis.   This is a shared visit with Dr Manus Gunning.  Patient has a hx uncontrolled diabetes, multiple admission for DKA.  Patient likely to need admission for pain control and intractable N/V if CT is negative.  Patient reports pain is controlled at present as long as she stays completely still laying on her right side.  With movement, she screams in pain.  On exam, pt is A&Ox4, NAD, RRR, CTAB, abdomen is soft, diffusely tender, no rebound.  Plan is for CT abd/pelvis, likely admission.  Will continue to follow.    7:07 PM Patient reports pain is currently 3/10.  She thinks she may be able to go home tonight with PO medications.  Will attempt to switch to PO meds.    8:25 PM Patient tolerating PO, states her pain is controlled.  No further vomiting.  Pt d/c home with pain and nausea medications.  Patient verbalizes understanding and agrees with plan.    Results for orders placed during the hospital encounter of 09/12/11  URINALYSIS, ROUTINE W REFLEX MICROSCOPIC      Component Value Range   Color, Urine YELLOW  YELLOW    APPearance CLEAR  CLEAR    Specific Gravity, Urine 1.039 (*) 1.005 - 1.030    pH 6.0  5.0 - 8.0    Glucose, UA >1000 (*) NEGATIVE (mg/dL)   Hgb urine dipstick NEGATIVE  NEGATIVE    Bilirubin Urine NEGATIVE  NEGATIVE    Ketones, ur >80 (*) NEGATIVE (mg/dL)   Protein, ur NEGATIVE  NEGATIVE (mg/dL)   Urobilinogen, UA 0.2  0.0 - 1.0 (mg/dL)   Nitrite NEGATIVE  NEGATIVE    Leukocytes, UA NEGATIVE  NEGATIVE   PREGNANCY, URINE      Component Value Range   Preg Test, Ur NEGATIVE  NEGATIVE   CBC      Component Value Range   WBC 11.7 (*) 4.0 - 10.5 (K/uL)   RBC 5.97 (*) 3.87 - 5.11 (MIL/uL)   Hemoglobin 14.0  12.0 - 15.0 (g/dL)   HCT 16.1  09.6 - 04.5 (%)   MCV 72.0 (*) 78.0 - 100.0 (fL)   MCH 23.5 (*) 26.0 - 34.0 (pg)   MCHC 32.6  30.0 - 36.0 (g/dL)   RDW 40.9  81.1 - 91.4 (%)   Platelets 181  150 - 400 (K/uL)  DIFFERENTIAL     Component Value Range   Neutrophils Relative 81 (*) 43 - 77 (%)   Neutro Abs 9.5 (*) 1.7 - 7.7 (K/uL)   Lymphocytes Relative 17  12 - 46 (%)   Lymphs Abs 2.0  0.7 - 4.0 (K/uL)   Monocytes Relative 1 (*) 3 - 12 (%)   Monocytes Absolute 0.2  0.1 - 1.0 (K/uL)   Eosinophils Relative 0  0 - 5 (%)   Eosinophils Absolute 0.0  0.0 - 0.7 (K/uL)   Basophils Relative 0  0 - 1 (%)   Basophils Absolute 0.0  0.0 - 0.1 (K/uL)  GLUCOSE, CAPILLARY      Component Value Range   Glucose-Capillary 366 (*) 70 - 99 (mg/dL)  GLUCOSE, CAPILLARY      Component Value Range   Glucose-Capillary 354 (*) 70 - 99 (mg/dL)  POCT I-STAT 3, BLOOD GAS (G3P V)      Component Value Range   pH, Ven 7.372 (*) 7.250 - 7.300    pCO2, Ven 42.4 (*) 45.0 - 50.0 (mmHg)   pO2, Ven 48.0 (*)  30.0 - 45.0 (mmHg)   Bicarbonate 24.7 (*) 20.0 - 24.0 (mEq/L)   TCO2 26  0 - 100 (mmol/L)   O2 Saturation 82.0     Acid-base deficit 1.0  0.0 - 2.0 (mmol/L)   Sample type VENOUS    KETONES, QUALITATIVE      Component Value Range   Acetone, Bld NEGATIVE  NEGATIVE   COMPREHENSIVE METABOLIC PANEL      Component Value Range   Sodium 141  135 - 145 (mEq/L)   Potassium 4.3  3.5 - 5.1 (mEq/L)   Chloride 104  96 - 112 (mEq/L)   CO2 21  19 - 32 (mEq/L)   Glucose, Bld 344 (*) 70 - 99 (mg/dL)   BUN 12  6 - 23 (mg/dL)   Creatinine, Ser 4.09 (*) 0.50 - 1.10 (mg/dL)   Calcium 9.7  8.4 - 81.1 (mg/dL)   Total Protein 7.7  6.0 - 8.3 (g/dL)   Albumin 3.9  3.5 - 5.2 (g/dL)   AST 53 (*) 0 - 37 (U/L)   ALT 45 (*) 0 - 35 (U/L)   Alkaline Phosphatase 82  39 - 117 (U/L)   Total Bilirubin 0.3  0.3 - 1.2 (mg/dL)   GFR calc non Af Amer >90  >90 (mL/min)   GFR calc Af Amer >90  >90 (mL/min)  LIPASE, BLOOD      Component Value Range   Lipase 23  11 - 59 (U/L)  URINE MICROSCOPIC-ADD ON      Component Value Range   Squamous Epithelial / LPF FEW (*) RARE    WBC, UA 0-2  <3 (WBC/hpf)   Bacteria, UA RARE  RARE    Urine-Other AMORPHOUS URATES/PHOSPHATES      Ct Abdomen Pelvis W Contrast  09/12/2011  *RADIOLOGY REPORT*  Clinical Data: 41 year old female with abdominal and pelvic pain with nausea and vomiting.  CT ABDOMEN AND PELVIS WITH CONTRAST  Technique:  Multidetector CT imaging of the abdomen and pelvis was performed following the standard protocol during bolus administration of intravenous contrast.  Contrast: 80mL OMNIPAQUE IOHEXOL 300 MG/ML IJ SOLN  Comparison: 03/26/2011 CT  Findings: The liver, spleen, pancreas, kidneys, adrenal glands, and gallbladder are unremarkable.  No enlarged lymph nodes, biliary dilation or abdominal aortic aneurysm identified.  A trace amount of free fluid in the pelvis may be physiologic.  No bowel or bladder abnormalities are noted. The appendix is not identified. No inflammation is identified in the pericecal region.  No acute or suspicious bony abnormalities are noted.  IMPRESSION: Trace free pelvic fluid - question physiologic.  No other acute abnormalities identified.  Please note that the appendix is not visualized but no definite inflammation in the pericecal region is noted.  Original Report Authenticated By: Rosendo Gros, M.D.   Dg Abd Acute W/chest  09/12/2011  *RADIOLOGY REPORT*  Clinical Data: Severe abdominal pain, nausea and vomiting.  ACUTE ABDOMEN SERIES (ABDOMEN 2 VIEW & CHEST 1 VIEW)  Comparison: 08/17/2011.  Findings: The heart remains normal in size and the lungs are clear. Normal bowel gas pattern without free peritoneal air.  Stable scoliosis.  IMPRESSION: No acute abnormality.  Original Report Authenticated By: Darrol Angel, M.D.   Dg Abd Acute W/chest  08/17/2011  *RADIOLOGY REPORT*  Clinical Data: Nausea, chest and abdominal pain  ACUTE ABDOMEN SERIES (ABDOMEN 2 VIEW & CHEST 1 VIEW)  Comparison: 07/11/2011  Findings: Stable mild dextroscoliosis of the thoracic spine.  Lungs are clear.  Heart size normal.  No effusion.  No free air.  Small bowel decompressed.  Moderate fecal material in the  proximal colon, decompressed distally.  No abnormal abdominal calcifications.  IMPRESSION:  1.  Nonobstructive bowel gas pattern with moderate colonic fecal material. 2.  No free air. 3.  No acute cardiopulmonary disease.  Original Report Authenticated By: Osa Craver, M.D.      Dillard Cannon Bayard, [place] 09/12/11 2135

## 2011-09-12 NOTE — ED Notes (Signed)
Patient transported to CT 

## 2011-09-12 NOTE — ED Notes (Signed)
Patient arrived to ed via [facility]  C/o abd pain onset today , patient refuses to answer questions states she is in to much pain and can't talk when she is in pain.

## 2011-09-12 NOTE — ED Provider Notes (Signed)
History     CSN: [id number]  Arrival date & time 09/12/11  1348   First MD Initiated Contact with Patient 09/12/11 1358      Chief Complaint  Patient presents with  . Abdominal Pain    (Consider location/radiation/quality/duration/timing/severity/associated sxs/prior treatment) HPI CC diffuse moderate abd pain associated with nbnb n/v onset this am.  Aggravated with eating.  No alleviating factors.  Pain is cramping, nonradiating.    Past Medical History  Diagnosis Date  . Diabetes mellitus     diagnosed in 1996-always been on insulin  . Hep C w/o coma, chronic     biopsy in 2010-no rx-was supposed to see a hepatologist in [place]  . Kidney stone   . Seizure disorder     started with pregnancy of first son  . Gastritis   . Bipolar 2 disorder   . Post traumatic stress disorder     "flipping out" after people close to her died  . DKA (diabetic ketoacidoses)     Recurrent admissions for DKA, medication non-complaince, poor social situation  . Cirrhosis   . Angina   . Heart murmur   . Shortness of breath     occassionally  . Seizures     pt states 93yrs ago  . Headache     occassionally  . Arthritis     r ankle-S/P surgery 11-22-2005)  . Anxiety   . Peripheral vascular disease     numbness bilaterally feet  . Anemia     childhood  . H/O hiatal hernia     2 yrs ago  . Depression     childhood    Past Surgical History  Procedure Date  . Ankle surgery   . Cesarean section     Family History  Problem Relation Age of Onset  . Diabetes Mother     currently 59  . Fibromyalgia Mother   . Cirrhosis Father     died in Nov 22, 1997    History  Substance Use Topics  . Smoking status: Current Everyday Smoker -- 0.2 packs/day for 25 years  . Smokeless tobacco: Never Used  . Alcohol Use: No     Endorses hasn't been drinking    OB History    Grav Para Term Preterm Abortions TAB SAB Ect Mult Living                  Review of Systems  Constitutional: Negative  for fever and chills.  Respiratory: Negative for shortness of breath.   Cardiovascular: Negative for chest pain.  Gastrointestinal: Positive for nausea, vomiting and abdominal pain. Negative for diarrhea, constipation and blood in stool.  Genitourinary: Negative for dysuria and frequency.  All other systems reviewed and are negative.    Allergies  Penicillins  Home Medications   Current Outpatient Rx  Name Route Sig Dispense Refill  . ASPIRIN EC 81 MG PO TBEC Oral Take 81 mg by mouth daily.     . INSULIN ASPART 100 UNIT/ML [place] SOLN Subcutaneous Inject 6 Units into the skin 3 (three) times daily with meals. 1 vial 2  . INSULIN GLARGINE 100 UNIT/ML [place] SOLN Subcutaneous Inject 50 Units into the skin daily. STARTING 08/19/11 1 vial 2  . INSULIN GLARGINE 100 UNIT/ML  SOLN Subcutaneous Inject 25 Units into the skin once. ONCE TONIGHT AT BEDTIME. THEN 50 UNITS DAILY FROM TOMORROW 10 mL   . LITHIUM CARBONATE ER 300 MG PO TBCR Oral Take 2 tablets (600 mg total) by mouth at bedtime.  30 tablet 0  . MAGNESIUM GLUCONATE 500 MG PO TABS Oral Take 1 tablet (500 mg total) by mouth 2 (two) times daily. 60 tablet 0  . PANTOPRAZOLE SODIUM 40 MG PO TBEC Oral Take 1 tablet (40 mg total) by mouth daily. 30 tablet 2  . QUETIAPINE FUMARATE 50 MG PO TABS Oral Take 1 tablet (50 mg total) by mouth at bedtime. 30 tablet 2    BP 115/72  Pulse 82  Temp(Src) 97.5 F (36.4 C) (Oral)  Resp 16  SpO2 97%  LMP 08/17/2011  Physical Exam  Nursing note and vitals reviewed. Constitutional: She appears well-developed and well-nourished.  HENT:  Head: Normocephalic and atraumatic.  Eyes: Right eye exhibits no discharge. Left eye exhibits no discharge.  Neck: Normal range of motion. Neck supple.  Cardiovascular: Normal rate, regular rhythm and normal heart sounds.   Pulmonary/Chest: Effort normal and breath sounds normal.  Abdominal: Soft. There is tenderness (mild, diffusely, more in eep).  Musculoskeletal: She  exhibits no edema and no tenderness.  Neurological: She is alert. GCS eye subscore is 4. GCS verbal subscore is 5. GCS motor subscore is 6.  Skin: Skin is warm and dry.  Psychiatric: She has a normal mood and affect. Her behavior is normal.    ED Course  Procedures (including critical care time)  Labs Reviewed  CBC - Abnormal; Notable for the following:    WBC 11.7 (*)    RBC 5.97 (*)    MCV 72.0 (*)    MCH 23.5 (*)    All other components within normal limits  DIFFERENTIAL - Abnormal; Notable for the following:    Neutrophils Relative 81 (*)    Neutro Abs 9.5 (*)    Monocytes Relative 1 (*)    All other components within normal limits  GLUCOSE, CAPILLARY - Abnormal; Notable for the following:    Glucose-Capillary 366 (*)    All other components within normal limits  GLUCOSE, CAPILLARY - Abnormal; Notable for the following:    Glucose-Capillary 354 (*)    All other components within normal limits  POCT I-STAT 3, BLOOD GAS (G3P V) - Abnormal; Notable for the following:    pH, Ven 7.372 (*)    pCO2, Ven 42.4 (*)    pO2, Ven 48.0 (*)    Bicarbonate 24.7 (*)    All other components within normal limits  URINALYSIS, ROUTINE W REFLEX MICROSCOPIC  PREGNANCY, URINE   Dg Abd Acute W/chest  09/12/2011  *RADIOLOGY REPORT*  Clinical Data: Severe abdominal pain, nausea and vomiting.  ACUTE ABDOMEN SERIES (ABDOMEN 2 VIEW & CHEST 1 VIEW)  Comparison: 08/17/2011.  Findings: The heart remains normal in size and the lungs are clear. Normal bowel gas pattern without free peritoneal air.  Stable scoliosis.  IMPRESSION: No acute abnormality.  Original Report Authenticated By: Darrol Angel, M.D.     No diagnosis found.    MDM  Pt is in nad, afvss, nontoxic appearing.  Pt with h/o tidm and dka here with n/v and abd pain, cbg 366.  C/f dka, getting labs including blood gas.  abd mildly ttp, no fever, most of pain is upper abd doubt appx, GU etiologies.  Getting lipase and cmp.  Do not feel  imaging needed.  Giving ivf's, dilaudid and zofran.  Recheck, pt still with moderate abd pain, pH nl so not dka, getting ct a/p.  Care transferred to Dr Criss Alvine.      Elijio Miles, MD 09/12/11 [phone]

## 2011-09-12 NOTE — Discharge Instructions (Signed)
It is very important that you monitor your blood sugar closely and take your medications as prescribed.  Please follow up with your primary care provider.  If you develop uncontrolled abdominal pain or vomiting, or are unable to tolerate fluids by mouth, please return to the ER immediately.  You may return to the ER at any time for worsening condition or any new symptoms that concern you.   Abdominal Pain Abdominal pain can be caused by many things. Your caregiver decides the seriousness of your pain by an examination and possibly blood tests and X-rays. Many cases can be observed and treated at home. Most abdominal pain is not caused by a disease and will probably improve without treatment. However, in many cases, more time must pass before a clear cause of the pain can be found. Before that point, it may not be known if you need more testing, or if hospitalization or surgery is needed. HOME CARE INSTRUCTIONS   Do not take laxatives unless directed by your caregiver.   Take pain medicine only as directed by your caregiver.   Only take over-the-counter or prescription medicines for pain, discomfort, or fever as directed by your caregiver.   Try a clear liquid diet (broth, tea, or water) for as long as directed by your caregiver. Slowly move to a bland diet as tolerated.  SEEK IMMEDIATE MEDICAL CARE IF:   The pain does not go away.   You have a fever.   You keep throwing up (vomiting).   The pain is felt only in portions of the abdomen. Pain in the right side could possibly be appendicitis. In an adult, pain in the left lower portion of the abdomen could be colitis or diverticulitis.   You pass bloody or black tarry stools.  MAKE SURE YOU:   Understand these instructions.   Will watch your condition.   Will get help right away if you are not doing well or get worse.  Document Released: 03/13/2005 Document Revised: 05/23/2011 Document Reviewed: 01/20/2008 [facility] Patient Information  2012 [place], [place].   Nausea and Vomiting Nausea is a sick feeling that often comes before throwing up (vomiting). Vomiting is a reflex where stomach contents come out of your mouth. Vomiting can cause severe loss of body fluids (dehydration). Children and elderly adults can become dehydrated quickly, especially if they also have diarrhea. Nausea and vomiting are symptoms of a condition or disease. It is important to find the cause of your symptoms. CAUSES   Direct irritation of the stomach lining. This irritation can result from increased acid production (gastroesophageal reflux disease), infection, food poisoning, taking certain medicines (such as nonsteroidal anti-inflammatory drugs), alcohol use, or tobacco use.   Signals from the brain.These signals could be caused by a headache, heat exposure, an inner ear disturbance, increased pressure in the brain from injury, infection, a tumor, or a concussion, pain, emotional stimulus, or metabolic problems.   An obstruction in the gastrointestinal tract (bowel obstruction).   Illnesses such as diabetes, hepatitis, gallbladder problems, appendicitis, kidney problems, cancer, sepsis, atypical symptoms of a heart attack, or eating disorders.   Medical treatments such as chemotherapy and radiation.   Receiving medicine that makes you sleep (general anesthetic) during surgery.  DIAGNOSIS Your caregiver may ask for tests to be done if the problems do not improve after a few days. Tests may also be done if symptoms are severe or if the reason for the nausea and vomiting is not clear. Tests may include:  Urine tests.  Blood tests.   Stool tests.   Cultures (to look for evidence of infection).   X-rays or other imaging studies.  Test results can help your caregiver make decisions about treatment or the need for additional tests. TREATMENT You need to stay well hydrated. Drink frequently but in small amounts.You may wish to drink water, sports  drinks, clear broth, or eat frozen ice pops or gelatin dessert to help stay hydrated.When you eat, eating slowly may help prevent nausea.There are also some antinausea medicines that may help prevent nausea. HOME CARE INSTRUCTIONS   Take all medicine as directed by your caregiver.   If you do not have an appetite, do not force yourself to eat. However, you must continue to drink fluids.   If you have an appetite, eat a normal diet unless your caregiver tells you differently.   Eat a variety of complex carbohydrates (rice, wheat, potatoes, bread), lean meats, yogurt, fruits, and vegetables.   Avoid high-fat foods because they are more difficult to digest.   Drink enough water and fluids to keep your urine clear or pale yellow.   If you are dehydrated, ask your caregiver for specific rehydration instructions. Signs of dehydration may include:   Severe thirst.   Dry lips and mouth.   Dizziness.   Dark urine.   Decreasing urine frequency and amount.   Confusion.   Rapid breathing or pulse.  SEEK IMMEDIATE MEDICAL CARE IF:   You have blood or brown flecks (like coffee grounds) in your vomit.   You have black or bloody stools.   You have a severe headache or stiff neck.   You are confused.   You have severe abdominal pain.   You have chest pain or trouble breathing.   You do not urinate at least once every 8 hours.   You develop cold or clammy skin.   You continue to vomit for longer than 24 to 48 hours.   You have a fever.  MAKE SURE YOU:   Understand these instructions.   Will watch your condition.   Will get help right away if you are not doing well or get worse.  Document Released: 06/03/2005 Document Revised: 05/23/2011 Document Reviewed: 10/31/2010 [facility] Patient Information 2012 [place], [place].   Hyperglycemia Hyperglycemia occurs when the glucose (sugar) in your blood is too high. Hyperglycemia can happen for many reasons, but it most often  happens to people who do not know they have diabetes or are not managing their diabetes properly.  CAUSES  Whether you have diabetes or not, there are other causes of hyperglycemia. Hyperglycemia can occur when you have diabetes, but it can also occur in other situations that you might not be as aware of, such as: Diabetes  If you have diabetes and are having problems controlling your blood glucose, hyperglycemia could occur because of some of the following reasons:   Not following your meal plan.   Not taking your diabetes medications or not taking it properly.   Exercising less or doing less activity than you normally do.   Being sick.  Pre-diabetes  This cannot be ignored. Before people develop Type 2 diabetes, they almost always have "pre-diabetes." This is when your blood glucose levels are higher than normal, but not yet high enough to be diagnosed as diabetes. Research has shown that some long-term damage to the body, especially the heart and circulatory system, may already be occurring during pre-diabetes. If you take action to manage your blood glucose when you have  pre-diabetes, you may delay or prevent Type 2 diabetes from developing.  Stress  If you have diabetes, you may be "diet" controlled or on oral medications or insulin to control your diabetes. However, you may find that your blood glucose is higher than usual in the hospital whether you have diabetes or not. This is often referred to as "stress hyperglycemia." Stress can elevate your blood glucose. This happens because of hormones put out by the body during times of stress. If stress has been the cause of your high blood glucose, it can be followed regularly by your caregiver. That way he/she can make sure your hyperglycemia does not continue to get worse or progress to diabetes.  Steroids  Steroids are medications that act on the infection fighting system (immune system) to block inflammation or infection. One side effect  can be a rise in blood glucose. Most people can produce enough extra insulin to allow for this rise, but for those who cannot, steroids make blood glucose levels go even higher. It is not unusual for steroid treatments to "uncover" diabetes that is developing. It is not always possible to determine if the hyperglycemia will go away after the steroids are stopped. A special blood test called an A1c is sometimes done to determine if your blood glucose was elevated before the steroids were started.  SYMPTOMS  Thirsty.   Frequent urination.   Dry mouth.   Blurred vision.   Tired or fatigue.   Weakness.   Sleepy.   Tingling in feet or leg.  DIAGNOSIS  Diagnosis is made by monitoring blood glucose in one or all of the following ways:  A1c test. This is a chemical found in your blood.   Fingerstick blood glucose monitoring.   Laboratory results.  TREATMENT  First, knowing the cause of the hyperglycemia is important before the hyperglycemia can be treated. Treatment may include, but is not be limited to:  Education.   Change or adjustment in medications.   Change or adjustment in meal plan.   Treatment for an illness, infection, etc.   More frequent blood glucose monitoring.   Change in exercise plan.   Decreasing or stopping steroids.   Lifestyle changes.  HOME CARE INSTRUCTIONS   Test your blood glucose as directed.   Exercise regularly. Your caregiver will give you instructions about exercise. Pre-diabetes or diabetes which comes on with stress is helped by exercising.   Eat wholesome, balanced meals. Eat often and at regular, fixed times. Your caregiver or nutritionist will give you a meal plan to guide your sugar intake.   Being at an ideal weight is important. If needed, losing as little as 10 to 15 pounds may help improve blood glucose levels.  SEEK MEDICAL CARE IF:   You have questions about medicine, activity, or diet.   You continue to have symptoms  (problems such as increased thirst, urination, or weight gain).  SEEK IMMEDIATE MEDICAL CARE IF:   You are vomiting or have diarrhea.   Your breath smells fruity.   You are breathing faster or slower.   You are very sleepy or incoherent.   You have numbness, tingling, or pain in your feet or hands.   You have chest pain.   Your symptoms get worse even though you have been following your caregiver's orders.   If you have any other questions or concerns.  Document Released: 11/27/2000 Document Revised: 05/23/2011 Document Reviewed: 01/23/2009 [facility] Patient Information 2012 [place], [place].

## 2011-09-12 NOTE — ED Notes (Signed)
IV attempted by 2 separate RN;s unsuccessful IV team in the dept will attempt. Noncompliant with insulin.

## 2011-09-13 NOTE — ED Provider Notes (Signed)
Medical screening examination/treatment/procedure(s) were conducted as a shared visit with non-physician practitioner(s) and myself.  I personally evaluated the patient during the encounter   Glynn Octave, MD 09/13/11 1534

## 2011-09-21 ENCOUNTER — Encounter (HOSPITAL_COMMUNITY): Payer: Self-pay

## 2011-09-21 ENCOUNTER — Inpatient Hospital Stay (HOSPITAL_COMMUNITY)
Admission: EM | Admit: 2011-09-21 | Discharge: 2011-09-23 | DRG: 638 | Disposition: A | Discharge status: Home / Self Care | Payer: Medicaid Other | Attending: Internal Medicine | Admitting: Internal Medicine

## 2011-09-21 ENCOUNTER — Other Ambulatory Visit: Payer: Self-pay

## 2011-09-21 DIAGNOSIS — E1110 Type 2 diabetes mellitus with ketoacidosis without coma: Secondary | ICD-10-CM

## 2011-09-21 DIAGNOSIS — G40909 Epilepsy, unspecified, not intractable, without status epilepticus: Secondary | ICD-10-CM

## 2011-09-21 DIAGNOSIS — Z9114 Patient's other noncompliance with medication regimen: Secondary | ICD-10-CM

## 2011-09-21 DIAGNOSIS — E871 Hypo-osmolality and hyponatremia: Secondary | ICD-10-CM | POA: Diagnosis present

## 2011-09-21 DIAGNOSIS — E876 Hypokalemia: Secondary | ICD-10-CM | POA: Diagnosis present

## 2011-09-21 DIAGNOSIS — E1010 Type 1 diabetes mellitus with ketoacidosis without coma: Secondary | ICD-10-CM | POA: Diagnosis present

## 2011-09-21 DIAGNOSIS — E860 Dehydration: Secondary | ICD-10-CM

## 2011-09-21 DIAGNOSIS — F3189 Other bipolar disorder: Secondary | ICD-10-CM | POA: Diagnosis present

## 2011-09-21 DIAGNOSIS — F17200 Nicotine dependence, unspecified, uncomplicated: Secondary | ICD-10-CM | POA: Diagnosis present

## 2011-09-21 DIAGNOSIS — D509 Iron deficiency anemia, unspecified: Secondary | ICD-10-CM | POA: Diagnosis present

## 2011-09-21 DIAGNOSIS — Z720 Tobacco use: Secondary | ICD-10-CM

## 2011-09-21 DIAGNOSIS — E1065 Type 1 diabetes mellitus with hyperglycemia: Secondary | ICD-10-CM

## 2011-09-21 DIAGNOSIS — N183 Chronic kidney disease, stage 3 unspecified: Secondary | ICD-10-CM | POA: Diagnosis present

## 2011-09-21 DIAGNOSIS — R112 Nausea with vomiting, unspecified: Secondary | ICD-10-CM | POA: Diagnosis present

## 2011-09-21 DIAGNOSIS — K7460 Unspecified cirrhosis of liver: Secondary | ICD-10-CM | POA: Diagnosis present

## 2011-09-21 DIAGNOSIS — D72829 Elevated white blood cell count, unspecified: Secondary | ICD-10-CM | POA: Diagnosis present

## 2011-09-21 DIAGNOSIS — F3181 Bipolar II disorder: Secondary | ICD-10-CM | POA: Diagnosis present

## 2011-09-21 DIAGNOSIS — Z9119 Patient's noncompliance with other medical treatment and regimen: Secondary | ICD-10-CM

## 2011-09-21 DIAGNOSIS — F319 Bipolar disorder, unspecified: Secondary | ICD-10-CM

## 2011-09-21 DIAGNOSIS — Z91199 Patient's noncompliance with other medical treatment and regimen due to unspecified reason: Secondary | ICD-10-CM

## 2011-09-21 DIAGNOSIS — B182 Chronic viral hepatitis C: Secondary | ICD-10-CM | POA: Diagnosis present

## 2011-09-21 DIAGNOSIS — R109 Unspecified abdominal pain: Secondary | ICD-10-CM | POA: Diagnosis present

## 2011-09-21 LAB — BLOOD GAS, ARTERIAL
Acid-base deficit: 11.3 mmol/L — ABNORMAL HIGH (ref 0.0–2.0)
Drawn by: 244901
O2 Content: 2 L/min
pCO2 arterial: 28.3 mmHg — ABNORMAL LOW (ref 35.0–45.0)
pH, Arterial: 7.301 — ABNORMAL LOW (ref 7.350–7.400)

## 2011-09-21 LAB — COMPREHENSIVE METABOLIC PANEL
ALT: 50 U/L — ABNORMAL HIGH (ref 0–35)
AST: 50 U/L — ABNORMAL HIGH (ref 0–37)
Calcium: 10.3 mg/dL (ref 8.4–10.5)
Sodium: 133 mEq/L — ABNORMAL LOW (ref 135–145)
Total Protein: 8.6 g/dL — ABNORMAL HIGH (ref 6.0–8.3)

## 2011-09-21 LAB — CBC
MCH: 23.4 pg — ABNORMAL LOW (ref 26.0–34.0)
MCV: 73.1 fL — ABNORMAL LOW (ref 78.0–100.0)
Platelets: 174 10*3/uL (ref 150–400)
RDW: 14.3 % (ref 11.5–15.5)
WBC: 11.7 10*3/uL — ABNORMAL HIGH (ref 4.0–10.5)

## 2011-09-21 LAB — URINALYSIS, ROUTINE W REFLEX MICROSCOPIC
Bilirubin Urine: NEGATIVE
Hgb urine dipstick: NEGATIVE
Specific Gravity, Urine: 1.031 — ABNORMAL HIGH (ref 1.005–1.030)
pH: 6 (ref 5.0–8.0)

## 2011-09-21 LAB — GLUCOSE, CAPILLARY

## 2011-09-21 LAB — DIFFERENTIAL
Basophils Absolute: 0 10*3/uL (ref 0.0–0.1)
Eosinophils Absolute: 0 10*3/uL (ref 0.0–0.7)
Eosinophils Relative: 0 % (ref 0–5)
Lymphocytes Relative: 23 % (ref 12–46)

## 2011-09-21 LAB — LIPASE, BLOOD: Lipase: 24 U/L (ref 11–59)

## 2011-09-21 MED ORDER — INSULIN ASPART 100 UNIT/ML ~~LOC~~ SOLN
10.0000 [IU] | Freq: Once | SUBCUTANEOUS | Status: AC
  Administered 2011-09-21: 10 [IU] via SUBCUTANEOUS
  Filled 2011-09-21: qty 1

## 2011-09-21 MED ORDER — SODIUM CHLORIDE 0.9 % IV BOLUS (SEPSIS)
1000.0000 mL | Freq: Once | INTRAVENOUS | Status: AC
  Administered 2011-09-21: 1000 mL via INTRAVENOUS

## 2011-09-21 MED ORDER — DEXTROSE 50 % IV SOLN
25.0000 mL | INTRAVENOUS | Status: DC | PRN
  Filled 2011-09-21: qty 50

## 2011-09-21 MED ORDER — GI COCKTAIL ~~LOC~~
30.0000 mL | Freq: Once | ORAL | Status: AC
  Administered 2011-09-21: 30 mL via ORAL
  Filled 2011-09-21: qty 30

## 2011-09-21 MED ORDER — INSULIN REGULAR HUMAN 100 UNIT/ML IJ SOLN
10.0000 [IU] | Freq: Once | INTRAMUSCULAR | Status: DC
  Filled 2011-09-21: qty 0.1

## 2011-09-21 MED ORDER — SODIUM CHLORIDE 0.9 % IV SOLN
INTRAVENOUS | Status: DC
  Administered 2011-09-21: 2.4 [IU]/h via INTRAVENOUS
  Filled 2011-09-21: qty 1

## 2011-09-21 MED ORDER — SODIUM CHLORIDE 0.9 % IV SOLN
1000.0000 mL | Freq: Once | INTRAVENOUS | Status: AC
  Administered 2011-09-21: 1000 mL via INTRAVENOUS

## 2011-09-21 MED ORDER — DEXTROSE-NACL 5-0.45 % IV SOLN
INTRAVENOUS | Status: DC
  Administered 2011-09-22: 01:00:00 via INTRAVENOUS

## 2011-09-21 MED ORDER — PANTOPRAZOLE SODIUM 40 MG IV SOLR
40.0000 mg | Freq: Once | INTRAVENOUS | Status: AC
  Administered 2011-09-21: 40 mg via INTRAVENOUS
  Filled 2011-09-21: qty 40

## 2011-09-21 MED ORDER — SODIUM CHLORIDE 0.9 % IV SOLN: INTRAVENOUS | Status: DC

## 2011-09-21 MED ORDER — SODIUM CHLORIDE 0.9 % IV SOLN
INTRAVENOUS | Status: DC
  Administered 2011-09-22 – 2011-09-23 (×4): via INTRAVENOUS

## 2011-09-21 MED ORDER — ONDANSETRON HCL 4 MG/2ML IJ SOLN
INTRAMUSCULAR | Status: AC
  Filled 2011-09-21: qty 2

## 2011-09-21 MED ORDER — MORPHINE SULFATE 4 MG/ML IJ SOLN
4.0000 mg | Freq: Once | INTRAMUSCULAR | Status: AC
  Administered 2011-09-21: 4 mg via INTRAVENOUS
  Filled 2011-09-21: qty 1

## 2011-09-21 NOTE — ED Notes (Signed)
RUE:[id number]<WJ> Expected date:09/21/11<BR> Expected time:<BR> Means of arrival:<BR> Comments:<BR> EMS 90 GC - abd pain

## 2011-09-21 NOTE — ED Notes (Signed)
Unable to obtain labs pt has no venous access notified RN aware.

## 2011-09-21 NOTE — ED Notes (Signed)
Per [facility]- Pt is unable to afford insulin- Ran out of insulin Lantus has been double up on regular.  Pt vomited and received Zofran in route

## 2011-09-21 NOTE — ED Notes (Signed)
IV team paged for IV access.

## 2011-09-21 NOTE — ED Notes (Signed)
Pt on cardiac monitor with continuous cycle of v/s. Pt on 2L of O2 per RN Rachel H.

## 2011-09-22 DIAGNOSIS — B182 Chronic viral hepatitis C: Secondary | ICD-10-CM | POA: Diagnosis present

## 2011-09-22 DIAGNOSIS — F3189 Other bipolar disorder: Secondary | ICD-10-CM | POA: Diagnosis present

## 2011-09-22 DIAGNOSIS — E860 Dehydration: Secondary | ICD-10-CM | POA: Diagnosis not present

## 2011-09-22 DIAGNOSIS — R112 Nausea with vomiting, unspecified: Secondary | ICD-10-CM | POA: Diagnosis present

## 2011-09-22 DIAGNOSIS — E876 Hypokalemia: Secondary | ICD-10-CM | POA: Diagnosis present

## 2011-09-22 DIAGNOSIS — D72829 Elevated white blood cell count, unspecified: Secondary | ICD-10-CM | POA: Diagnosis present

## 2011-09-22 DIAGNOSIS — K7460 Unspecified cirrhosis of liver: Secondary | ICD-10-CM | POA: Diagnosis present

## 2011-09-22 DIAGNOSIS — Z9119 Patient's noncompliance with other medical treatment and regimen: Secondary | ICD-10-CM | POA: Diagnosis not present

## 2011-09-22 DIAGNOSIS — R109 Unspecified abdominal pain: Secondary | ICD-10-CM | POA: Diagnosis present

## 2011-09-22 DIAGNOSIS — D509 Iron deficiency anemia, unspecified: Secondary | ICD-10-CM | POA: Diagnosis present

## 2011-09-22 DIAGNOSIS — E1010 Type 1 diabetes mellitus with ketoacidosis without coma: Secondary | ICD-10-CM | POA: Diagnosis present

## 2011-09-22 DIAGNOSIS — E871 Hypo-osmolality and hyponatremia: Secondary | ICD-10-CM | POA: Diagnosis present

## 2011-09-22 DIAGNOSIS — F17200 Nicotine dependence, unspecified, uncomplicated: Secondary | ICD-10-CM | POA: Diagnosis present

## 2011-09-22 LAB — GLUCOSE, CAPILLARY
Glucose-Capillary: 131 mg/dL — ABNORMAL HIGH (ref 70–99)
Glucose-Capillary: 197 mg/dL — ABNORMAL HIGH (ref 70–99)
Glucose-Capillary: 222 mg/dL — ABNORMAL HIGH (ref 70–99)
Glucose-Capillary: 99 mg/dL (ref 70–99)
Glucose-Capillary: 63 mg/dL — ABNORMAL LOW (ref 70–99)
Glucose-Capillary: 99 mg/dL (ref 70–99)

## 2011-09-22 LAB — CBC
HCT: 36.7 % (ref 36.0–46.0)
MCV: 74 fL — ABNORMAL LOW (ref 78.0–100.0)
RDW: 14.2 % (ref 11.5–15.5)
WBC: 12.9 10*3/uL — ABNORMAL HIGH (ref 4.0–10.5)

## 2011-09-22 LAB — BASIC METABOLIC PANEL
BUN: 15 mg/dL (ref 6–23)
BUN: 14 mg/dL (ref 6–23)
CO2: 20 mEq/L (ref 19–32)
Calcium: 9.1 mg/dL (ref 8.4–10.5)
Chloride: 106 mEq/L (ref 96–112)
Chloride: 107 mEq/L (ref 96–112)
Creatinine, Ser: 0.48 mg/dL — ABNORMAL LOW (ref 0.50–1.10)
Creatinine, Ser: 0.46 mg/dL — ABNORMAL LOW (ref 0.50–1.10)
Glucose, Bld: 197 mg/dL — ABNORMAL HIGH (ref 70–99)
Potassium: 4.2 mEq/L (ref 3.5–5.1)

## 2011-09-22 LAB — LITHIUM LEVEL: Lithium Lvl: <0.25 mEq/L — ABNORMAL LOW (ref 0.80–1.40)

## 2011-09-22 LAB — HEMOGLOBIN A1C
Hgb A1c MFr Bld: 13.3 % — ABNORMAL HIGH (ref <5.7)
Mean Plasma Glucose: 335 mg/dL — ABNORMAL HIGH (ref <117)

## 2011-09-22 MED ORDER — INSULIN GLARGINE 100 UNIT/ML ~~LOC~~ SOLN: 45.0000 [IU] | Freq: Every day | SUBCUTANEOUS | Status: DC

## 2011-09-22 MED ORDER — DEXTROSE-NACL 5-0.45 % IV SOLN
INTRAVENOUS | Status: DC
  Administered 2011-09-22: 08:00:00 via INTRAVENOUS

## 2011-09-22 MED ORDER — SODIUM CHLORIDE 0.9 % IV SOLN: INTRAVENOUS | Status: DC

## 2011-09-22 MED ORDER — ASPIRIN EC 81 MG PO TBEC
81.0000 mg | DELAYED_RELEASE_TABLET | Freq: Every day | ORAL | Status: DC
  Administered 2011-09-22 – 2011-09-23 (×2): 81 mg via ORAL
  Filled 2011-09-22 (×2): qty 1

## 2011-09-22 MED ORDER — MAGNESIUM GLUCONATE 500 MG PO TABS
500.0000 mg | ORAL_TABLET | Freq: Two times a day (BID) | ORAL | Status: DC
  Administered 2011-09-22 – 2011-09-23 (×2): 500 mg via ORAL
  Filled 2011-09-22 (×3): qty 1

## 2011-09-22 MED ORDER — QUETIAPINE FUMARATE 50 MG PO TABS
50.0000 mg | ORAL_TABLET | Freq: Every day | ORAL | Status: DC
  Administered 2011-09-22: 50 mg via ORAL
  Filled 2011-09-22 (×2): qty 1

## 2011-09-22 MED ORDER — INSULIN GLARGINE 100 UNIT/ML ~~LOC~~ SOLN
15.0000 [IU] | Freq: Every day | SUBCUTANEOUS | Status: AC
  Administered 2011-09-22: 15 [IU] via SUBCUTANEOUS

## 2011-09-22 MED ORDER — INSULIN ASPART 100 UNIT/ML ~~LOC~~ SOLN
4.0000 [IU] | Freq: Three times a day (TID) | SUBCUTANEOUS | Status: DC
  Administered 2011-09-22 – 2011-09-23 (×4): 4 [IU] via SUBCUTANEOUS

## 2011-09-22 MED ORDER — LITHIUM CARBONATE ER 300 MG PO TBCR
600.0000 mg | EXTENDED_RELEASE_TABLET | Freq: Every day | ORAL | Status: DC
  Administered 2011-09-22: 600 mg via ORAL
  Filled 2011-09-22 (×2): qty 2

## 2011-09-22 MED ORDER — INSULIN ASPART 100 UNIT/ML ~~LOC~~ SOLN
0.0000 [IU] | Freq: Every day | SUBCUTANEOUS | Status: DC
  Administered 2011-09-22: 3 [IU] via SUBCUTANEOUS

## 2011-09-22 MED ORDER — ENOXAPARIN SODIUM 40 MG/0.4ML ~~LOC~~ SOLN
40.0000 mg | SUBCUTANEOUS | Status: DC
  Administered 2011-09-22 – 2011-09-23 (×2): 40 mg via SUBCUTANEOUS
  Filled 2011-09-22 (×2): qty 0.4

## 2011-09-22 MED ORDER — INSULIN ASPART 100 UNIT/ML ~~LOC~~ SOLN
0.0000 [IU] | SUBCUTANEOUS | Status: DC
  Administered 2011-09-22: 2 [IU] via SUBCUTANEOUS

## 2011-09-22 MED ORDER — INSULIN GLARGINE 100 UNIT/ML ~~LOC~~ SOLN
35.0000 [IU] | Freq: Once | SUBCUTANEOUS | Status: AC
  Administered 2011-09-22: 35 [IU] via SUBCUTANEOUS

## 2011-09-22 MED ORDER — DEXTROSE 50 % IV SOLN
25.0000 mL | INTRAVENOUS | Status: DC | PRN
  Filled 2011-09-22: qty 50

## 2011-09-22 MED ORDER — INSULIN GLARGINE 100 UNIT/ML ~~LOC~~ SOLN: 50.0000 [IU] | Freq: Every day | SUBCUTANEOUS | Status: DC

## 2011-09-22 MED ORDER — INSULIN ASPART 100 UNIT/ML ~~LOC~~ SOLN
0.0000 [IU] | Freq: Three times a day (TID) | SUBCUTANEOUS | Status: DC
  Administered 2011-09-22: 15 [IU] via SUBCUTANEOUS
  Administered 2011-09-23 (×2): 3 [IU] via SUBCUTANEOUS

## 2011-09-22 MED ORDER — SODIUM CHLORIDE 0.9 % IV SOLN
1.0000 g | Freq: Once | INTRAVENOUS | Status: AC
  Administered 2011-09-22: 1 g via INTRAVENOUS
  Filled 2011-09-22: qty 10

## 2011-09-22 MED ORDER — MORPHINE SULFATE 2 MG/ML IJ SOLN
1.0000 mg | INTRAMUSCULAR | Status: DC | PRN
  Administered 2011-09-22 – 2011-09-23 (×4): 2 mg via INTRAVENOUS
  Filled 2011-09-22 (×4): qty 1

## 2011-09-22 MED ORDER — INSULIN REGULAR HUMAN 100 UNIT/ML IJ SOLN
INTRAMUSCULAR | Status: DC
  Filled 2011-09-22: qty 1

## 2011-09-22 NOTE — H&P (Signed)
DATE OF ADMISSION:  09/22/2011  PCP:    Georganna Skeans, MD, MD   Chief Complaint: Increased Blood Sugars Nausea Vomiting and ABD Pain   HPI: Mary [name] is an 41 y.o. female with uncontrolled type 1 Diabetes who is well known to the [facility] due to multiple admissions for DKA, who presents with complaints of elevated blood sugars into the 400s over the past 3 days since she ran out of her Lantus insulin.  She reports also having nausea vomiting and loose stools along with ABD Pain.  She denies having any chest pain ,SOB, or fevers or chills.  In the ED she was found to be in DKA with a HCO3 level of 8, and a Glucose level of 424.  She was started on the IV Insulin protocol for DKA, and Fluid resuscitation was ordered.  She was referred for medical admission.     Past Medical History  Diagnosis Date  . Diabetes mellitus     diagnosed in 1996-always been on insulin  . Hep C w/o coma, chronic     biopsy in 2010-no rx-was supposed to see a hepatologist in [place]  . Kidney stone   . Seizure disorder     started with pregnancy of first son  . Gastritis   . Bipolar 2 disorder   . Post traumatic stress disorder     "flipping out" after people close to her died  . DKA (diabetic ketoacidoses)     Recurrent admissions for DKA, medication non-complaince, poor social situation  . Cirrhosis   . Angina   . Heart murmur   . Shortness of breath     occassionally  . Seizures     pt states 69yrs ago  . Headache     occassionally  . Arthritis     r ankle-S/P surgery (2007)  . Anxiety   . Peripheral vascular disease     numbness bilaterally feet  . Anemia     childhood  . H/O hiatal hernia     2 yrs ago  . Depression     childhood    Past Surgical History  Procedure Date  . Ankle surgery   . Cesarean section     Medications:  HOME MEDS: Prior to Admission medications   Medication Sig Start Date End Date Taking? Authorizing Provider  aspirin EC 81 MG  tablet Take 81 mg by mouth daily.    Yes Historical Provider, MD  insulin aspart (NOVOLOG) 100 UNIT/ML injection Inject 6 Units into the skin 3 (three) times daily with meals. 08/18/11 08/17/12 Yes Osvaldo Shipper, MD  insulin glargine (LANTUS) 100 UNIT/ML injection Inject 50 Units into the skin at bedtime. STARTING 08/19/11 08/19/11  Yes Osvaldo Shipper, MD  lithium carbonate (LITHOBID) 300 MG CR tablet Take 600 mg by mouth at bedtime. 08/18/11  Yes Osvaldo Shipper, MD  magnesium gluconate (MAGONATE) 500 MG tablet Take 500 mg by mouth 2 (two) times daily. 08/18/11  Yes Osvaldo Shipper, MD  pantoprazole (PROTONIX) 40 MG tablet Take 40 mg by mouth daily. 08/18/11  Yes Osvaldo Shipper, MD  QUEtiapine (SEROQUEL) 50 MG tablet Take 50 mg by mouth at bedtime. 08/18/11  Yes Osvaldo Shipper, MD  promethazine (PHENERGAN) 25 MG suppository Place 1 suppository (25 mg total) rectally every 6 (six) hours as needed for nausea (or vomiting). 09/12/11 09/19/11  Rise Patience, PA    Allergies:  Allergies  Allergen Reactions  . Penicillins Rash    Social History:   reports  that she has been smoking.  She has never used smokeless tobacco. She reports that she does not drink alcohol or use illicit drugs.  Family History: Family History  Problem Relation Age of Onset  . Diabetes Mother     currently 1  . Fibromyalgia Mother   . Cirrhosis Father     died in 12/02/97    Review of Systems:  The patient denies anorexia, fever, weight loss,, vision loss, decreased hearing, hoarseness, chest pain, syncope, dyspnea on exertion, peripheral edema, balance deficits, hemoptysis, abdominal pain, melena, hematochezia, severe indigestion/heartburn, hematuria, incontinence, genital sores, muscle weakness, suspicious skin lesions, transient blindness, difficulty walking, depression, unusual weight change, abnormal bleeding, enlarged lymph nodes, angioedema, and breast masses.   Physical Exam:  GEN:  Thin 41 year old [place] American female  examined  and in no acute distress; cooperative with exam Filed Vitals:   09/22/11 0100 09/22/11 0119 09/22/11 0215 09/22/11 0301  BP: 92/50 99/53 102/44 113/68  Pulse: 82 84 83 82  Temp:    97.7 F (36.5 C)  TempSrc:    Oral  Resp: 13 11 13 14   Height:    5\' 4"  (1.626 m)  Weight:    49.3 kg (108 lb 11 oz)  SpO2: 100% 100% 100% 100%   Blood pressure 113/68, pulse 82, temperature 97.7 F (36.5 C), temperature source Oral, resp. rate 14, height 5\' 4"  (1.626 m), weight 49.3 kg (108 lb 11 oz), SpO2 100.00%. PSYCH: She is alert and oriented x4; does not appear anxious does not appear depressed; affect is normal HEENT: Normocephalic and Atraumatic, Mucous membranes pink; PERRLA; EOM intact; Fundi:  Benign;  No scleral icterus, Nares: Patent, Oropharynx: Clear,  Fair Dentition, Neck:  FROM, no cervical lymphadenopathy nor thyromegaly or carotid bruit; no JVD; Breasts:: Not examined CHEST WALL: No tenderness CHEST: Normal respiration, clear to auscultation bilaterally HEART: Regular rate and rhythm; no murmurs rubs or gallops BACK: No kyphosis or scoliosis; no CVA tenderness ABDOMEN: Positive Bowel Sounds, Scaphoid, Soft non-tender; no masses, no organomegaly.   Rectal Exam: Not done EXTREMITIES: No bone or joint deformity; age-appropriate arthropathy of the hands and knees; no cyanosis, clubbing or edema; no ulcerations. Genitalia: not examined PULSES: 2+ and symmetric SKIN: Normal hydration no rash or ulceration CNS: Cranial nerves 2-12 grossly intact no focal neurologic deficit   Labs & Imaging Results for orders placed during the hospital encounter of 09/21/11 (from the past 48 hour(s))  GLUCOSE, CAPILLARY     Status: Abnormal   Collection Time   09/21/11  7:35 PM      Component Value Range Comment   Glucose-Capillary 463 (*) 70 - 99 (mg/dL)   CBC     Status: Abnormal   Collection Time   09/21/11  7:36 PM      Component Value Range Comment   WBC 11.7 (*) 4.0 - 10.5 (K/uL)    RBC  6.46 (*) 3.87 - 5.11 (MIL/uL)    Hemoglobin 15.1 (*) 12.0 - 15.0 (g/dL)    HCT 02.7 (*) 25.3 - 46.0 (%)    MCV 73.1 (*) 78.0 - 100.0 (fL)    MCH 23.4 (*) 26.0 - 34.0 (pg)    MCHC 32.0  30.0 - 36.0 (g/dL)    RDW 66.4  40.3 - 47.4 (%)    Platelets 174  150 - 400 (K/uL)   DIFFERENTIAL     Status: Abnormal   Collection Time   09/21/11  7:36 PM      Component Value  Range Comment   Neutrophils Relative 72  43 - 77 (%)    Neutro Abs 8.4 (*) 1.7 - 7.7 (K/uL)    Lymphocytes Relative 23  12 - 46 (%)    Lymphs Abs 2.7  0.7 - 4.0 (K/uL)    Monocytes Relative 5  3 - 12 (%)    Monocytes Absolute 0.6  0.1 - 1.0 (K/uL)    Eosinophils Relative 0  0 - 5 (%)    Eosinophils Absolute 0.0  0.0 - 0.7 (K/uL)    Basophils Relative 0  0 - 1 (%)    Basophils Absolute 0.0  0.0 - 0.1 (K/uL)   COMPREHENSIVE METABOLIC PANEL     Status: Abnormal   Collection Time   09/21/11  7:36 PM      Component Value Range Comment   Sodium 133 (*) 135 - 145 (mEq/L)    Potassium 5.3 (*) 3.5 - 5.1 (mEq/L)    Chloride 93 (*) 96 - 112 (mEq/L)    CO2 8 (*) 19 - 32 (mEq/L)    Glucose, Bld 425 (*) 70 - 99 (mg/dL)    BUN 20  6 - 23 (mg/dL)    Creatinine, Ser 1.61  0.50 - 1.10 (mg/dL)    Calcium 09.6  8.4 - 10.5 (mg/dL)    Total Protein 8.6 (*) 6.0 - 8.3 (g/dL)    Albumin 4.6  3.5 - 5.2 (g/dL)    AST 50 (*) 0 - 37 (U/L)    ALT 50 (*) 0 - 35 (U/L)    Alkaline Phosphatase 90  39 - 117 (U/L)    Total Bilirubin 0.3  0.3 - 1.2 (mg/dL)    GFR calc non Af Amer >90  >90 (mL/min)    GFR calc Af Amer >90  >90 (mL/min)   LIPASE, BLOOD     Status: Normal   Collection Time   09/21/11  8:41 PM      Component Value Range Comment   Lipase 24  11 - 59 (U/L)   GLUCOSE, CAPILLARY     Status: Abnormal   Collection Time   09/21/11 10:51 PM      Component Value Range Comment   Glucose-Capillary 301 (*) 70 - 99 (mg/dL)    Comment 1 Notify RN      Comment 2 Documented in Chart     URINALYSIS, ROUTINE W REFLEX MICROSCOPIC     Status: Abnormal    Collection Time   09/21/11 11:00 PM      Component Value Range Comment   Color, Urine YELLOW  YELLOW     APPearance CLEAR  CLEAR     Specific Gravity, Urine 1.031 (*) 1.005 - 1.030     pH 6.0  5.0 - 8.0     Glucose, UA >1000 (*) NEGATIVE (mg/dL)    Hgb urine dipstick NEGATIVE  NEGATIVE     Bilirubin Urine NEGATIVE  NEGATIVE     Ketones, ur >80 (*) NEGATIVE (mg/dL)    Protein, ur NEGATIVE  NEGATIVE (mg/dL)    Urobilinogen, UA 0.2  0.0 - 1.0 (mg/dL)    Nitrite NEGATIVE  NEGATIVE     Leukocytes, UA NEGATIVE  NEGATIVE    URINE MICROSCOPIC-ADD ON     Status: Abnormal   Collection Time   09/21/11 11:00 PM      Component Value Range Comment   Squamous Epithelial / LPF FEW (*) RARE     WBC, UA 0-2  <3 (WBC/hpf)    RBC / HPF  0-2  <3 (RBC/hpf)   BLOOD GAS, ARTERIAL     Status: Abnormal   Collection Time   09/21/11 11:20 PM      Component Value Range Comment   O2 Content 2.0      Delivery systems NASAL CANNULA      pH, Arterial 7.301 (*) 7.350 - 7.400     pCO2 arterial 28.3 (*) 35.0 - 45.0 (mmHg)    pO2, Arterial 133.0 (*) 80.0 - 100.0 (mmHg)    Bicarbonate 13.5 (*) 20.0 - 24.0 (mEq/L)    TCO2 12.6  0 - 100 (mmol/L)    Acid-base deficit 11.3 (*) 0.0 - 2.0 (mmol/L)    O2 Saturation 98.3      Patient temperature 98.6      Collection site RIGHT RADIAL      Drawn by [phone]      Sample type ARTERIAL DRAW      Allens test (pass/fail) PASS  PASS    GLUCOSE, CAPILLARY     Status: Abnormal   Collection Time   09/21/11 11:53 PM      Component Value Range Comment   Glucose-Capillary 160 (*) 70 - 99 (mg/dL)   GLUCOSE, CAPILLARY     Status: Abnormal   Collection Time   09/22/11 12:11 AM      Component Value Range Comment   Glucose-Capillary 134 (*) 70 - 99 (mg/dL)   GLUCOSE, CAPILLARY     Status: Abnormal   Collection Time   09/22/11  1:17 AM      Component Value Range Comment   Glucose-Capillary 131 (*) 70 - 99 (mg/dL)   CBC     Status: Abnormal   Collection Time   09/22/11  2:00 AM       Component Value Range Comment   WBC 12.9 (*) 4.0 - 10.5 (K/uL)    RBC 4.96  3.87 - 5.11 (MIL/uL)    Hemoglobin 11.3 (*) 12.0 - 15.0 (g/dL)    HCT 04.5  40.9 - 81.1 (%)    MCV 74.0 (*) 78.0 - 100.0 (fL)    MCH 22.8 (*) 26.0 - 34.0 (pg)    MCHC 30.8  30.0 - 36.0 (g/dL)    RDW 91.4  78.2 - 95.6 (%)    Platelets 186  150 - 400 (K/uL)   BASIC METABOLIC PANEL     Status: Abnormal   Collection Time   09/22/11  2:00 AM      Component Value Range Comment   Sodium 136  135 - 145 (mEq/L)    Potassium 4.2  3.5 - 5.1 (mEq/L)    Chloride 106  96 - 112 (mEq/L)    CO2 16 (*) 19 - 32 (mEq/L)    Glucose, Bld 197 (*) 70 - 99 (mg/dL)    BUN 15  6 - 23 (mg/dL)    Creatinine, Ser 2.13 (*) 0.50 - 1.10 (mg/dL)    Calcium 8.2 (*) 8.4 - 10.5 (mg/dL)    GFR calc non Af Amer >90  >90 (mL/min)    GFR calc Af Amer >90  >90 (mL/min)   GLUCOSE, CAPILLARY     Status: Abnormal   Collection Time   09/22/11  2:22 AM      Component Value Range Comment   Glucose-Capillary 197 (*) 70 - 99 (mg/dL)    Comment 1 Notify RN      Comment 2 Glucose Stablizer     GLUCOSE, CAPILLARY     Status: Abnormal   Collection Time  09/22/11  3:39 AM      Component Value Range Comment   Glucose-Capillary 222 (*) 70 - 99 (mg/dL)    No results found.    Assessment/Plan: Present on Admission:  .DKA, type 1 .Abdominal pain .Nausea & vomiting .DM (diabetes mellitus), type 1, uncontrolled .CKD (chronic kidney disease) stage 3, GFR 30-59 ml/min .Bipolar 2 disorder .Hep C w/o coma, chronic .Tobacco abuse .Cirrhosis of liver .Leukocytosis .Microcytic anemia .Dehydration  Plan:    Admitted continue the IV Insulin drip  And IVFs per the DKA Protocol.   Transition to SSI coverage and restart her Lantus Insulin.   Monitor electrolytes and replete as needed PRN Antiemetics, and Pain Control DVT Prophylaxis Smoking Cessation Counseling.   Other plans as per orders.    CODE STATUS:      FULL CODE      Critical care time: 60  minutes.   Aarin Sparkman C 09/22/2011, 4:21 AM
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
## 2011-09-22 NOTE — ED Notes (Signed)
Attempt to call report to 4W unsuccessful. Receiving RN will return call to ED when available for report.  

## 2011-09-22 NOTE — Progress Notes (Signed)
Patient's CBG was 409 Dr. Waymon Amato was notified. Stated not to order lab draw for confirmation. Patient received Novolog insulin  according to protocol no new orders received . Will continue to monitor patient and notify physician accordingly.
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
## 2011-09-23 LAB — BASIC METABOLIC PANEL
Calcium: 8.5 mg/dL (ref 8.4–10.5)
Creatinine, Ser: 0.45 mg/dL — ABNORMAL LOW (ref 0.50–1.10)
GFR calc Af Amer: >90 mL/min (ref >90)

## 2011-09-23 MED ORDER — INSULIN ASPART 100 UNIT/ML ~~LOC~~ SOLN: 6.0000 [IU] | Freq: Three times a day (TID) | SUBCUTANEOUS | Status: DC

## 2011-09-23 MED ORDER — INSULIN GLARGINE 100 UNIT/ML ~~LOC~~ SOLN: 50.0000 [IU] | Freq: Every day | SUBCUTANEOUS | Status: DC

## 2011-09-23 MED ORDER — POTASSIUM CHLORIDE CRYS ER 20 MEQ PO TBCR
40.0000 meq | EXTENDED_RELEASE_TABLET | Freq: Once | ORAL | Status: AC
  Administered 2011-09-23: 40 meq via ORAL
  Filled 2011-09-23: qty 2

## 2011-09-23 NOTE — Progress Notes (Signed)
Revisited patient and asked if she could use some of her money from her cell phone, cigarettes and nails to afford her insulin.  She said she doesn't smoke anymore and must have her cell phone because her parents are elderly and sick.  States she will try to get the money from her fiance, but that will not be possible until after the first of the month.
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
## 2011-09-23 NOTE — Discharge Instructions (Signed)
Information for insulin resources given and reviewed with patient.  Coupon for 93% off Lantus insulin given to patient for use.   Information for [facility] and location given to patient. Keep appointment with Dr. Andrey Campanile on [phone]. No smoking.

## 2011-09-23 NOTE — Progress Notes (Signed)
Inpatient Diabetes Program  Pt with multiple admissions for DKA presents with several days of vomiting and epigastric pain. Pt states she has been out of her Lantus and she has been using reg insulin. No fever chills, dysuria, diarrhea. Pt states she cannot afford her Lantus and Novolog at [facility] which is $20 per prescription.  "I don't know why y'all don't just give me an insulin pump."  States she has been trying to get disability but they continue to deny her.  Does not have Medicaid.  She said she couldn't work because of her diabetes.  "Maybe someone will bless me and give me the money for my insulin.  If not, God will take care of me."  Discussed with pt again that she has to have her insulin and [facility] is the least expensive way to obtain it.  Said she is tired of talking about this to hospital staff because she gets upset and that runs her blood sugar up.  Has [facility] appt on April 22nd.  There are no other options that I know of at present.  Will await social work / case Market researcher.  Eating well.  CBG this am - 161 mg/dL.

## 2011-09-30 ENCOUNTER — Emergency Department (HOSPITAL_COMMUNITY)
Admission: EM | Admit: 2011-09-30 | Discharge: 2011-10-01 | Disposition: A | Discharge status: Home / Self Care | Payer: Medicaid Other | Attending: Emergency Medicine | Admitting: Emergency Medicine

## 2011-09-30 ENCOUNTER — Emergency Department (HOSPITAL_COMMUNITY): Payer: Medicaid Other

## 2011-09-30 ENCOUNTER — Encounter (HOSPITAL_COMMUNITY): Payer: Self-pay | Admitting: *Deleted

## 2011-09-30 DIAGNOSIS — R0602 Shortness of breath: Secondary | ICD-10-CM | POA: Diagnosis not present

## 2011-09-30 DIAGNOSIS — Z8619 Personal history of other infectious and parasitic diseases: Secondary | ICD-10-CM | POA: Diagnosis not present

## 2011-09-30 DIAGNOSIS — F3130 Bipolar disorder, current episode depressed, mild or moderate severity, unspecified: Secondary | ICD-10-CM | POA: Insufficient documentation

## 2011-09-30 DIAGNOSIS — E119 Type 2 diabetes mellitus without complications: Secondary | ICD-10-CM | POA: Insufficient documentation

## 2011-09-30 DIAGNOSIS — Z7982 Long term (current) use of aspirin: Secondary | ICD-10-CM | POA: Diagnosis not present

## 2011-09-30 DIAGNOSIS — R739 Hyperglycemia, unspecified: Secondary | ICD-10-CM

## 2011-09-30 DIAGNOSIS — Z79899 Other long term (current) drug therapy: Secondary | ICD-10-CM | POA: Diagnosis not present

## 2011-09-30 DIAGNOSIS — R079 Chest pain, unspecified: Secondary | ICD-10-CM | POA: Diagnosis not present

## 2011-09-30 DIAGNOSIS — R1084 Generalized abdominal pain: Secondary | ICD-10-CM | POA: Insufficient documentation

## 2011-09-30 DIAGNOSIS — I739 Peripheral vascular disease, unspecified: Secondary | ICD-10-CM | POA: Insufficient documentation

## 2011-09-30 DIAGNOSIS — R112 Nausea with vomiting, unspecified: Secondary | ICD-10-CM | POA: Diagnosis present

## 2011-09-30 DIAGNOSIS — K7460 Unspecified cirrhosis of liver: Secondary | ICD-10-CM | POA: Diagnosis not present

## 2011-09-30 DIAGNOSIS — G40909 Epilepsy, unspecified, not intractable, without status epilepticus: Secondary | ICD-10-CM | POA: Diagnosis not present

## 2011-09-30 DIAGNOSIS — Z794 Long term (current) use of insulin: Secondary | ICD-10-CM | POA: Insufficient documentation

## 2011-09-30 LAB — POCT I-STAT, CHEM 8
BUN: 12 mg/dL (ref 6–23)
Creatinine, Ser: 0.5 mg/dL (ref 0.50–1.10)
Glucose, Bld: 281 mg/dL — ABNORMAL HIGH (ref 70–99)
Hemoglobin: 16 g/dL — ABNORMAL HIGH (ref 12.0–15.0)
Potassium: 4.2 mEq/L (ref 3.5–5.1)

## 2011-09-30 MED ORDER — ONDANSETRON HCL 4 MG/2ML IJ SOLN
4.0000 mg | Freq: Once | INTRAMUSCULAR | Status: AC
  Administered 2011-09-30: 4 mg via INTRAVENOUS
  Filled 2011-09-30: qty 2

## 2011-09-30 MED ORDER — HYDROMORPHONE HCL PF 1 MG/ML IJ SOLN
1.0000 mg | Freq: Once | INTRAMUSCULAR | Status: AC
  Administered 2011-09-30: 1 mg via INTRAVENOUS
  Filled 2011-09-30: qty 1

## 2011-09-30 MED ORDER — SODIUM CHLORIDE 0.9 % IV BOLUS (SEPSIS)
1000.0000 mL | Freq: Once | INTRAVENOUS | Status: AC
  Administered 2011-09-30: 1000 mL via INTRAVENOUS

## 2011-09-30 MED ORDER — ONDANSETRON HCL 4 MG/2ML IJ SOLN
INTRAMUSCULAR | Status: AC
  Administered 2011-09-30: 4 mg via INTRAVENOUS
  Filled 2011-09-30: qty 2

## 2011-09-30 NOTE — ED Provider Notes (Signed)
History     CSN: [id number]  Arrival date & time 09/30/11  11-18-2014   First MD Initiated Contact with Patient 09/30/11 [id number]      Chief Complaint  Patient presents with  . Nausea  . Emesis  . Abdominal Pain  . Hyperglycemia    (Consider location/radiation/quality/duration/timing/severity/associated sxs/prior treatment) HPI Comments: Patient reports she developed severe abdominal and low back pain with N/V this morning.  Patient has frequent admissions for DKA and states this feels the same as her recurrent DKA.  Pain is indescribable and constant, worse with any movement.  States the pain is so bad she is also now hurting in her chest with mild SOB. Denies any hematemesis, change in bowel movements, urinary symptoms.  Pt was discharged from the hospital on April 8 and states she has since not taken any of her insulin because she cannot afford it and her appointment with [facility] isn't until April 22.    The history is provided by the patient and medical records.    Past Medical History  Diagnosis Date  . Diabetes mellitus     diagnosed in 1996-always been on insulin  . Hep C w/o coma, chronic     biopsy in 2010-no rx-was supposed to see a hepatologist in [place]  . Kidney stone   . Seizure disorder     started with pregnancy of first son  . Gastritis   . Bipolar 2 disorder   . Post traumatic stress disorder     "flipping out" after people close to her died  . DKA (diabetic ketoacidoses)     Recurrent admissions for DKA, medication non-complaince, poor social situation  . Cirrhosis   . Angina   . Heart murmur   . Shortness of breath     occassionally  . Seizures     pt states 39yrs ago  . Headache     occassionally  . Arthritis     r ankle-S/P surgery 11-17-2005)  . Anxiety   . Peripheral vascular disease     numbness bilaterally feet  . Anemia     childhood  . H/O hiatal hernia     2 yrs ago  . Depression     childhood    Past Surgical History  Procedure  Date  . Ankle surgery   . Cesarean section     Family History  Problem Relation Age of Onset  . Diabetes Mother     currently 78  . Fibromyalgia Mother   . Cirrhosis Father     died in 11-17-97    History  Substance Use Topics  . Smoking status: Current Everyday Smoker -- 0.2 packs/day for 25 years  . Smokeless tobacco: Never Used  . Alcohol Use: No     Endorses hasn't been drinking    OB History    Grav Para Term Preterm Abortions TAB SAB Ect Mult Living                  Review of Systems  Constitutional: Negative for fever.  All other systems reviewed and are negative.    Allergies  Penicillins  Home Medications   Current Outpatient Rx  Name Route Sig Dispense Refill  . ASPIRIN EC 81 MG PO TBEC Oral Take 81 mg by mouth daily.     . INSULIN ASPART 100 UNIT/ML [place] SOLN Subcutaneous Inject 6 Units into the skin 3 (three) times daily with meals. 1 vial 0  . INSULIN GLARGINE 100 UNIT/ML [place] SOLN  Subcutaneous Inject 50 Units into the skin at bedtime. 10 mL 0  . LITHIUM CARBONATE ER 300 MG PO TBCR Oral Take 600 mg by mouth at bedtime.    Marland Kitchen MAGNESIUM GLUCONATE 500 MG PO TABS Oral Take 500 mg by mouth 2 (two) times daily.    Marland Kitchen PANTOPRAZOLE SODIUM 40 MG PO TBEC Oral Take 40 mg by mouth daily.    . QUETIAPINE FUMARATE 50 MG PO TABS Oral Take 50 mg by mouth at bedtime.      BP 123/86  Pulse 92  Temp(Src) 98.7 F (37.1 C) (Oral)  Resp 12  SpO2 99%  Physical Exam  Nursing note and vitals reviewed. Constitutional: She is oriented to person, place, and time. She appears well-developed and well-nourished.  HENT:  Head: Normocephalic and atraumatic.  Neck: Normal range of motion. Neck supple.  Cardiovascular: Normal rate, regular rhythm and normal heart sounds.   Pulmonary/Chest: Breath sounds normal. No respiratory distress. She has no wheezes. She has no rales. She exhibits no tenderness.  Abdominal: Soft. Bowel sounds are normal. She exhibits no distension and no mass.  There is generalized tenderness. There is no rebound and no guarding.  Musculoskeletal: Normal range of motion. She exhibits no edema and no tenderness.  Neurological: She is alert and oriented to person, place, and time.  Psychiatric: She has a normal mood and affect. Her behavior is normal. Judgment and thought content normal.    ED Course  Procedures (including critical care time)  Labs Reviewed  CBC - Abnormal; Notable for the following:    WBC 13.3 (*)    RBC 5.78 (*)    MCV 73.4 (*)    MCH 22.8 (*)    All other components within normal limits  DIFFERENTIAL - Abnormal; Notable for the following:    Lymphocytes Relative 47 (*)    Lymphs Abs 6.3 (*)    All other components within normal limits  COMPREHENSIVE METABOLIC PANEL - Abnormal; Notable for the following:    CO2 14 (*) REPEATED TO VERIFY   Glucose, Bld 257 (*)    Creatinine, Ser 0.44 (*)    AST 64 (*)    ALT 63 (*)    All other components within normal limits  URINALYSIS, ROUTINE W REFLEX MICROSCOPIC - Abnormal; Notable for the following:    Glucose, UA >1000 (*)    Ketones, ur >80 (*)    All other components within normal limits  POCT I-STAT, CHEM 8 - Abnormal; Notable for the following:    Glucose, Bld 281 (*)    Hemoglobin 16.0 (*)    HCT 47.0 (*)    All other components within normal limits  GLUCOSE, CAPILLARY - Abnormal; Notable for the following:    Glucose-Capillary 244 (*)    All other components within normal limits  URINE MICROSCOPIC-ADD ON - Abnormal; Notable for the following:    Squamous Epithelial / LPF FEW (*)    Bacteria, UA MANY (*)    All other components within normal limits  GLUCOSE, CAPILLARY - Abnormal; Notable for the following:    Glucose-Capillary 228 (*)    All other components within normal limits  LIPASE, BLOOD  PREGNANCY, URINE  URINE CULTURE   Dg Abd Acute W/chest  09/30/2011  *RADIOLOGY REPORT*  Clinical Data: Abdominal pain and chest pain.  Nausea and vomiting.  ACUTE  ABDOMEN SERIES (ABDOMEN 2 VIEW & CHEST 1 VIEW)  Comparison: CT abdomen and pelvis with contrast 09/12/2011  Findings: There is a slight  convex right scoliosis of the lower thoracic spine and a moderate rotatory convex left scoliosis of the lumbar spine.  The lungs are clear.  Heart size is normal.  There is no visible pleural effusion.  No evidence of free intraperitoneal air on decubitus views of the abdomen.  The bowel gas pattern is nonobstructive.  No radiopaque urinary tract calculus is identified.  IMPRESSION:  1.  Nonobstructive bowel gas pattern. 2.  No acute cardiopulmonary sees. 3.  Thoracolumbar scoliosis.  Original Report Authenticated By: Britta Mccreedy, M.D.   Labs and "toxic granulation" reviewed with Dr Dierdre Highman.    3:12 AM Patient reports she is feeling much better, has been sleeping soundly.  Reexamination of abdomen is benign: soft, diffusely tender, no guarding, no rebound, no masses.    Discussed all results with paitent.  Patient agrees with d/c home  Patient states she has prescriptions for her medications, she just has not gotten them filled.  She is not eligible for free medications from our pharmacy.  She has been seen by case management during her most recent admission and has a scheduled follow up appointment with [facility] in 6 days.    1. Abdominal pain   2. Nausea and vomiting   3. Hyperglycemia       MDM  Patient with hx Diabetes Type 1 and multiple admissions for DKA due to medication noncompliance presents today with abd pain, N/V, concern that she was in DKA.  Pt labs show hyperglycemia without DKA, patient's pain and nausea resolved after one dose of medication.  Given relief of pain and reexamination of abdomen with no localized or concerning findings, I do not believe imaging is necessary at this time.  Patient states she has prescriptions for her medications and is working on getting them filled.  Does not qualify for free prescriptions from hospital currently  as she has previously used this benefit.  Pt to return for worsening condition.  Return precautions given.  Patient verbalizes understanding and agrees with plan.          Dillard Cannon [place], [place] 10/01/11 [phone]

## 2011-09-30 NOTE — ED Notes (Signed)
Received pt. From home via EMS, pt. Alert and oriented, IV access per EMS, Zofran given

## 2011-09-30 NOTE — ED Notes (Signed)
Pt placed on bedpan and clean catch specimen received. Pt requested ice chips and repositioning help.

## 2011-09-30 NOTE — ED Notes (Signed)
Patient transported to X-ray. Will get labs when pt returns  

## 2011-09-30 NOTE — ED Notes (Signed)
WGN:[id number]<HY> Expected date:09/30/11<BR> Expected time: 8:20 PM<BR> Means of arrival:Ambulance<BR> Comments:<BR> M211. 41 yo f. abd pain and diabetes problems. 20 mins

## 2011-10-01 LAB — URINALYSIS, ROUTINE W REFLEX MICROSCOPIC
Bilirubin Urine: NEGATIVE
Hgb urine dipstick: NEGATIVE
Ketones, ur: >80 mg/dL — AB
Nitrite: NEGATIVE
Urobilinogen, UA: 0.2 mg/dL (ref 0.0–1.0)
pH: 6 (ref 5.0–8.0)

## 2011-10-01 LAB — DIFFERENTIAL
Basophils Relative: 0 % (ref 0–1)
Eosinophils Relative: 1 % (ref 0–5)
Lymphocytes Relative: 47 % — ABNORMAL HIGH (ref 12–46)
Monocytes Relative: 7 % (ref 3–12)
Neutro Abs: 6 10*3/uL (ref 1.7–7.7)

## 2011-10-01 LAB — COMPREHENSIVE METABOLIC PANEL
Alkaline Phosphatase: 94 U/L (ref 39–117)
BUN: 12 mg/dL (ref 6–23)
Chloride: 96 mEq/L (ref 96–112)
GFR calc Af Amer: >90 mL/min (ref >90)
Glucose, Bld: 257 mg/dL — ABNORMAL HIGH (ref 70–99)
Potassium: 4.1 mEq/L (ref 3.5–5.1)
Total Bilirubin: 0.4 mg/dL (ref 0.3–1.2)

## 2011-10-01 LAB — PATHOLOGIST SMEAR REVIEW

## 2011-10-01 LAB — URINE MICROSCOPIC-ADD ON

## 2011-10-01 LAB — CBC
HCT: 42.4 % (ref 36.0–46.0)
Hemoglobin: 13.2 g/dL (ref 12.0–15.0)
WBC: 13.3 10*3/uL — ABNORMAL HIGH (ref 4.0–10.5)

## 2011-10-01 LAB — LIPASE, BLOOD: Lipase: 23 U/L (ref 11–59)

## 2011-10-01 MED ORDER — INSULIN REGULAR HUMAN 100 UNIT/ML IJ SOLN: 6.0000 [IU] | Freq: Once | INTRAMUSCULAR | Status: DC

## 2011-10-01 MED ORDER — INSULIN ASPART 100 UNIT/ML ~~LOC~~ SOLN
6.0000 [IU] | Freq: Once | SUBCUTANEOUS | Status: AC
  Administered 2011-10-01: 6 [IU] via SUBCUTANEOUS
  Filled 2011-10-01: qty 6

## 2011-10-01 MED ORDER — INSULIN ASPART 100 UNIT/ML ~~LOC~~ SOLN
6.0000 [IU] | Freq: Once | SUBCUTANEOUS | Status: AC
  Administered 2011-10-01: 6 [IU] via SUBCUTANEOUS
  Filled 2011-10-01: qty 5
  Filled 2011-10-01: qty 1

## 2011-10-01 MED ORDER — ONDANSETRON HCL 4 MG/2ML IJ SOLN
4.0000 mg | Freq: Once | INTRAMUSCULAR | Status: AC
  Administered 2011-10-01: 4 mg via INTRAVENOUS
  Filled 2011-10-01: qty 2

## 2011-10-01 MED ORDER — HYDROMORPHONE HCL PF 1 MG/ML IJ SOLN
1.0000 mg | Freq: Once | INTRAMUSCULAR | Status: AC
  Administered 2011-10-01: 1 mg via INTRAVENOUS
  Filled 2011-10-01: qty 1

## 2011-10-01 NOTE — ED Notes (Signed)
Pt. Discharged to home pt. Alert and oriented, ambulatory gait steady, NAD noted

## 2011-10-01 NOTE — Discharge Instructions (Signed)
It is very important that you take your insulin as directed and follow up with your primary care provider as soon as possible. Please drink plenty of water and control your blood sugar with your diet as well.  If you develop worsening abdominal pain, uncontrolled nausea and vomiting, or any new or concerning symptoms, return immediately to the ER.  You may return to the ER at any time for worsening condition or any new symptoms that concern you.

## 2011-10-02 ENCOUNTER — Inpatient Hospital Stay (HOSPITAL_COMMUNITY)
Admission: EM | Admit: 2011-10-02 | Discharge: 2011-10-04 | DRG: 639 | Disposition: A | Discharge status: Home / Self Care | Payer: Medicaid Other | Attending: Internal Medicine | Admitting: Internal Medicine

## 2011-10-02 ENCOUNTER — Encounter (HOSPITAL_COMMUNITY): Payer: Self-pay

## 2011-10-02 DIAGNOSIS — E1010 Type 1 diabetes mellitus with ketoacidosis without coma: Principal | ICD-10-CM | POA: Diagnosis present

## 2011-10-02 DIAGNOSIS — E1110 Type 2 diabetes mellitus with ketoacidosis without coma: Secondary | ICD-10-CM

## 2011-10-02 DIAGNOSIS — B182 Chronic viral hepatitis C: Secondary | ICD-10-CM | POA: Diagnosis present

## 2011-10-02 DIAGNOSIS — R109 Unspecified abdominal pain: Secondary | ICD-10-CM | POA: Diagnosis present

## 2011-10-02 DIAGNOSIS — R1013 Epigastric pain: Secondary | ICD-10-CM | POA: Diagnosis present

## 2011-10-02 DIAGNOSIS — E1065 Type 1 diabetes mellitus with hyperglycemia: Secondary | ICD-10-CM | POA: Diagnosis present

## 2011-10-02 DIAGNOSIS — F319 Bipolar disorder, unspecified: Secondary | ICD-10-CM | POA: Diagnosis present

## 2011-10-02 HISTORY — DX: Polyneuropathy, unspecified: G62.9

## 2011-10-02 LAB — COMPREHENSIVE METABOLIC PANEL
ALT: 70 U/L — ABNORMAL HIGH (ref 0–35)
AST: 67 U/L — ABNORMAL HIGH (ref 0–37)
Alkaline Phosphatase: 108 U/L (ref 39–117)
CO2: 14 mEq/L — ABNORMAL LOW (ref 19–32)
GFR calc Af Amer: >90 mL/min (ref >90)
GFR calc non Af Amer: >90 mL/min (ref >90)
Glucose, Bld: 254 mg/dL — ABNORMAL HIGH (ref 70–99)
Potassium: 4.6 mEq/L (ref 3.5–5.1)
Sodium: 134 mEq/L — ABNORMAL LOW (ref 135–145)

## 2011-10-02 LAB — BLOOD GAS, VENOUS
Bicarbonate: 15.6 mEq/L — ABNORMAL LOW (ref 20.0–24.0)
FIO2: 0.21 %
Patient temperature: 98.6
TCO2: 14.4 mmol/L (ref 0–100)
pCO2, Ven: 33.9 mmHg — ABNORMAL LOW (ref 45.0–50.0)
pH, Ven: 7.286 (ref 7.250–7.300)

## 2011-10-02 LAB — CBC
HCT: 43.5 % (ref 36.0–46.0)
Hemoglobin: 14.9 g/dL (ref 12.0–15.0)
RBC: 5.93 MIL/uL — ABNORMAL HIGH (ref 3.87–5.11)
WBC: 10.4 10*3/uL (ref 4.0–10.5)

## 2011-10-02 LAB — GLUCOSE, CAPILLARY
Glucose-Capillary: 266 mg/dL — ABNORMAL HIGH (ref 70–99)
Glucose-Capillary: 216 mg/dL — ABNORMAL HIGH (ref 70–99)
Glucose-Capillary: 203 mg/dL — ABNORMAL HIGH (ref 70–99)

## 2011-10-02 LAB — URINE CULTURE: Colony Count: NO GROWTH

## 2011-10-02 LAB — POCT PREGNANCY, URINE: Preg Test, Ur: NEGATIVE

## 2011-10-02 LAB — DIFFERENTIAL
Basophils Relative: 0 % (ref 0–1)
Eosinophils Relative: 1 % (ref 0–5)
Lymphocytes Relative: 47 % — ABNORMAL HIGH (ref 12–46)
Monocytes Relative: 6 % (ref 3–12)
Neutro Abs: 4.8 10*3/uL (ref 1.7–7.7)
Neutrophils Relative %: 46 % (ref 43–77)

## 2011-10-02 LAB — URINE MICROSCOPIC-ADD ON

## 2011-10-02 LAB — URINALYSIS, ROUTINE W REFLEX MICROSCOPIC
Nitrite: NEGATIVE
Protein, ur: NEGATIVE mg/dL
Specific Gravity, Urine: 1.032 — ABNORMAL HIGH (ref 1.005–1.030)
Urobilinogen, UA: 0.2 mg/dL (ref 0.0–1.0)

## 2011-10-02 MED ORDER — QUETIAPINE FUMARATE 50 MG PO TABS
50.0000 mg | ORAL_TABLET | Freq: Every day | ORAL | Status: DC
  Administered 2011-10-03: 50 mg via ORAL
  Filled 2011-10-02 (×2): qty 1

## 2011-10-02 MED ORDER — SODIUM CHLORIDE 0.45 % IV SOLN: INTRAVENOUS | Status: DC

## 2011-10-02 MED ORDER — INSULIN ASPART 100 UNIT/ML ~~LOC~~ SOLN: 0.0000 [IU] | Freq: Three times a day (TID) | SUBCUTANEOUS | Status: DC

## 2011-10-02 MED ORDER — HEPARIN SODIUM (PORCINE) 5000 UNIT/ML IJ SOLN
5000.0000 [IU] | Freq: Three times a day (TID) | INTRAMUSCULAR | Status: DC
  Administered 2011-10-03 – 2011-10-04 (×4): 5000 [IU] via SUBCUTANEOUS
  Filled 2011-10-02 (×8): qty 1

## 2011-10-02 MED ORDER — INSULIN GLARGINE 100 UNIT/ML ~~LOC~~ SOLN: 50.0000 [IU] | Freq: Every day | SUBCUTANEOUS | Status: DC

## 2011-10-02 MED ORDER — INSULIN REGULAR BOLUS VIA INFUSION
0.0000 [IU] | Freq: Three times a day (TID) | INTRAVENOUS | Status: DC
Start: 2011-10-03 — End: 2011-10-02

## 2011-10-02 MED ORDER — FENTANYL CITRATE 0.05 MG/ML IJ SOLN
100.0000 ug | Freq: Once | INTRAMUSCULAR | Status: AC
  Administered 2011-10-02: 100 ug via INTRAVENOUS
  Filled 2011-10-02: qty 2

## 2011-10-02 MED ORDER — DEXTROSE-NACL 5-0.45 % IV SOLN
INTRAVENOUS | Status: DC
  Administered 2011-10-03: via INTRAVENOUS

## 2011-10-02 MED ORDER — ASPIRIN EC 81 MG PO TBEC
81.0000 mg | DELAYED_RELEASE_TABLET | Freq: Every day | ORAL | Status: DC
  Administered 2011-10-03 – 2011-10-04 (×2): 81 mg via ORAL
  Filled 2011-10-02 (×2): qty 1

## 2011-10-02 MED ORDER — LITHIUM CARBONATE ER 300 MG PO TBCR
600.0000 mg | EXTENDED_RELEASE_TABLET | Freq: Every day | ORAL | Status: DC
  Administered 2011-10-03: 600 mg via ORAL
  Filled 2011-10-02 (×2): qty 2

## 2011-10-02 MED ORDER — POTASSIUM CHLORIDE 10 MEQ/100ML IV SOLN
10.0000 meq | INTRAVENOUS | Status: AC
  Administered 2011-10-02 – 2011-10-03 (×2): 10 meq via INTRAVENOUS
  Filled 2011-10-02: qty 200

## 2011-10-02 MED ORDER — METHOCARBAMOL 500 MG PO TABS
ORAL_TABLET | ORAL | Status: AC
  Filled 2011-10-02: qty 2

## 2011-10-02 MED ORDER — INSULIN REGULAR HUMAN 100 UNIT/ML IJ SOLN
INTRAMUSCULAR | Status: DC
  Administered 2011-10-02: 1.6 [IU]/h via INTRAVENOUS
  Filled 2011-10-02: qty 1

## 2011-10-02 MED ORDER — INSULIN REGULAR HUMAN 100 UNIT/ML IJ SOLN
INTRAMUSCULAR | Status: DC
  Administered 2011-10-03: 2.9 [IU]/h via INTRAVENOUS
  Filled 2011-10-02: qty 1

## 2011-10-02 MED ORDER — HYDROMORPHONE HCL PF 1 MG/ML IJ SOLN
1.0000 mg | Freq: Once | INTRAMUSCULAR | Status: AC
  Administered 2011-10-02: 1 mg via INTRAVENOUS
  Filled 2011-10-02: qty 1

## 2011-10-02 MED ORDER — ONDANSETRON HCL 4 MG/2ML IJ SOLN
4.0000 mg | Freq: Once | INTRAMUSCULAR | Status: AC
  Administered 2011-10-02: 4 mg via INTRAVENOUS
  Filled 2011-10-02: qty 2

## 2011-10-02 MED ORDER — DEXTROSE-NACL 5-0.45 % IV SOLN
INTRAVENOUS | Status: DC
  Administered 2011-10-02: 22:00:00 via INTRAVENOUS

## 2011-10-02 MED ORDER — SODIUM CHLORIDE 0.9 % IV SOLN: INTRAVENOUS | Status: DC

## 2011-10-02 MED ORDER — SODIUM CHLORIDE 0.9 % IV SOLN: 1000.0000 mL | Freq: Once | INTRAVENOUS | Status: DC

## 2011-10-02 MED ORDER — DEXTROSE 50 % IV SOLN
25.0000 mL | INTRAVENOUS | Status: DC | PRN
  Filled 2011-10-02: qty 50

## 2011-10-02 MED ORDER — DEXTROSE 50 % IV SOLN: 25.0000 mL | INTRAVENOUS | Status: DC | PRN

## 2011-10-02 MED ORDER — SODIUM CHLORIDE 0.9 % IV BOLUS (SEPSIS)
1000.0000 mL | Freq: Once | INTRAVENOUS | Status: AC
  Administered 2011-10-02: 1000 mL via INTRAVENOUS

## 2011-10-02 MED ORDER — SODIUM CHLORIDE 0.9 % IV SOLN
1000.0000 mL | INTRAVENOUS | Status: DC
Start: 2011-10-02 — End: 2011-10-02

## 2011-10-02 NOTE — ED Notes (Signed)
Admitting MD at bedside.
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
## 2011-10-02 NOTE — ED Notes (Signed)
Per EMS- Patient was seen on 09/30/11 in the [facility] for abdominal  Pain. Patient was given a Rx for Percocet, but could not get the prescription filled and is still c/o abd pain.

## 2011-10-02 NOTE — ED Provider Notes (Signed)
History     CSN: [id number]  Arrival date & time 10/02/11  1344   First MD Initiated Contact with Patient 10/02/11 1736      Chief Complaint  Patient presents with  . Abdominal Pain    (Consider location/radiation/quality/duration/timing/severity/associated sxs/prior treatment) HPI  Pt presents to the ED with complaints of abdominal pain. The patient was seen two days ago for the same and had an abdominal CT scan on September 12, 2011 for the same. The CT scan was normal. The patient was discharged on the 15th as her labs showed her not to be in DKA. She is a diabetic who has been admitted for DKA before. She denies having gastroparesis and states she does not know why her upper abdomen has been hurting. She was given an Rx for Percocet but advises me she could not fill it because she can't afford it. She also can not use the prescription program as she has used that in the past as well. When I first walked into exam room the patient was asleep and resting quietly, as soon as i began talking she started to weep, moan and grunt in pain. She asks me right away if I will please admit her to the hospital.   Past Medical History  Diagnosis Date  . Diabetes mellitus     diagnosed in 1996-always been on insulin  . Hep C w/o coma, chronic     biopsy in 2010-no rx-was supposed to see a hepatologist in [place]  . Kidney stone   . Seizure disorder     started with pregnancy of first son  . Gastritis   . Bipolar 2 disorder   . Post traumatic stress disorder     "flipping out" after people close to her died  . DKA (diabetic ketoacidoses)     Recurrent admissions for DKA, medication non-complaince, poor social situation  . Cirrhosis   . Angina   . Heart murmur   . Shortness of breath     occassionally  . Seizures     pt states 32yrs ago  . Headache     occassionally  . Arthritis     r ankle-S/P surgery 2005-11-21)  . Anxiety   . Peripheral vascular disease     numbness bilaterally feet    . Anemia     childhood  . H/O hiatal hernia     2 yrs ago  . Depression     childhood    Past Surgical History  Procedure Date  . Ankle surgery   . Cesarean section     Family History  Problem Relation Age of Onset  . Diabetes Mother     currently 84  . Fibromyalgia Mother   . Cirrhosis Father     died in Nov 21, 1997    History  Substance Use Topics  . Smoking status: Former Smoker -- 0.2 packs/day for 25 years  . Smokeless tobacco: Never Used  . Alcohol Use: No     Endorses hasn't been drinking    OB History    Grav Para Term Preterm Abortions TAB SAB Ect Mult Living                  Review of Systems  All other systems reviewed and are negative.    Allergies  Penicillins  Home Medications   Current Outpatient Rx  Name Route Sig Dispense Refill  . ASPIRIN EC 81 MG PO TBEC Oral Take 81 mg by mouth daily.     Marland Kitchen  INSULIN ASPART 100 UNIT/ML [place] SOLN Subcutaneous Inject 6 Units into the skin 3 (three) times daily with meals. 1 vial 0  . INSULIN GLARGINE 100 UNIT/ML [place] SOLN Subcutaneous Inject 50 Units into the skin at bedtime. 10 mL 0  . LITHIUM CARBONATE ER 300 MG PO TBCR Oral Take 600 mg by mouth at bedtime.    Marland Kitchen MAGNESIUM GLUCONATE 500 MG PO TABS Oral Take 500 mg by mouth 2 (two) times daily.    Marland Kitchen PANTOPRAZOLE SODIUM 40 MG PO TBEC Oral Take 40 mg by mouth daily.    . QUETIAPINE FUMARATE 50 MG PO TABS Oral Take 50 mg by mouth at bedtime.      BP 99/67  Pulse 64  Temp(Src) 98.1 F (36.7 C) (Oral)  Resp 18  SpO2 100%  LMP 09/15/2011  Physical Exam  Nursing note and vitals reviewed. Constitutional: She appears well-developed and well-nourished. No distress.       Pt appears disheveled.   HENT:  Head: Normocephalic and atraumatic.  Eyes: Pupils are equal, round, and reactive to light.  Neck: Normal range of motion. Neck supple.  Cardiovascular: Normal rate and regular rhythm.   Pulmonary/Chest: Effort normal.  Abdominal: Soft. She exhibits no distension  and no mass. There is tenderness (diffuse tenderness to very light palpation). There is no rebound and no guarding.  Neurological: She is alert.  Skin: Skin is warm and dry.    ED Course  Procedures (including critical care time)  Labs Reviewed  CBC - Abnormal; Notable for the following:    RBC 5.93 (*)    MCV 73.4 (*)    MCH 25.1 (*)    All other components within normal limits  DIFFERENTIAL - Abnormal; Notable for the following:    Lymphocytes Relative 47 (*)    Lymphs Abs 4.9 (*)    All other components within normal limits  COMPREHENSIVE METABOLIC PANEL - Abnormal; Notable for the following:    Sodium 134 (*)    Chloride 95 (*) DELTA CHECK NOTED   CO2 14 (*)    Glucose, Bld 254 (*)    Creatinine, Ser 0.49 (*)    Total Protein 8.5 (*)    AST 67 (*)    ALT 70 (*)    All other components within normal limits  URINALYSIS, ROUTINE W REFLEX MICROSCOPIC - Abnormal; Notable for the following:    APPearance CLOUDY (*)    Specific Gravity, Urine 1.032 (*)    Glucose, UA >1000 (*)    Ketones, ur >80 (*)    All other components within normal limits  URINE MICROSCOPIC-ADD ON - Abnormal; Notable for the following:    Squamous Epithelial / LPF FEW (*)    Bacteria, UA FEW (*)    All other components within normal limits  GLUCOSE, CAPILLARY - Abnormal; Notable for the following:    Glucose-Capillary 266 (*)    All other components within normal limits  BLOOD GAS, VENOUS - Abnormal; Notable for the following:    pCO2, Ven 33.9 (*)    Bicarbonate 15.6 (*)    Acid-base deficit 9.7 (*)    All other components within normal limits  LIPASE, BLOOD  POCT PREGNANCY, URINE  KETONES, QUALITATIVE   Dg Abd Acute W/chest  09/30/2011  *RADIOLOGY REPORT*  Clinical Data: Abdominal pain and chest pain.  Nausea and vomiting.  ACUTE ABDOMEN SERIES (ABDOMEN 2 VIEW & CHEST 1 VIEW)  Comparison: CT abdomen and pelvis with contrast 09/12/2011  Findings: There is a  slight convex right scoliosis of the  lower thoracic spine and a moderate rotatory convex left scoliosis of the lumbar spine.  The lungs are clear.  Heart size is normal.  There is no visible pleural effusion.  No evidence of free intraperitoneal air on decubitus views of the abdomen.  The bowel gas pattern is nonobstructive.  No radiopaque urinary tract calculus is identified.  IMPRESSION:  1.  Nonobstructive bowel gas pattern. 2.  No acute cardiopulmonary sees. 3.  Thoracolumbar scoliosis.  Original Report Authenticated By: Britta Mccreedy, M.D.     1. DKA (diabetic ketoacidoses)       MDM  7:40 PM- pts pain controlled in the ED.  Labs pending.  Pts Anion Gap is 25. Will fluid resuscitate patient and get her admitted to internal medicine.   PT admitted to [facility] hospitalist Team 5 with Dr. Roselie Awkward, regular bed.  Dorthula Matas, PA 10/02/11 2124

## 2011-10-02 NOTE — ED Notes (Signed)
RN attempting for an IV. Will get labs if unable.

## 2011-10-02 NOTE — ED Notes (Signed)
Attempted to call report, RN to call back as soon as she is available.

## 2011-10-03 DIAGNOSIS — E1010 Type 1 diabetes mellitus with ketoacidosis without coma: Secondary | ICD-10-CM | POA: Diagnosis present

## 2011-10-03 DIAGNOSIS — E1110 Type 2 diabetes mellitus with ketoacidosis without coma: Secondary | ICD-10-CM | POA: Diagnosis not present

## 2011-10-03 DIAGNOSIS — F319 Bipolar disorder, unspecified: Secondary | ICD-10-CM | POA: Diagnosis present

## 2011-10-03 DIAGNOSIS — R1013 Epigastric pain: Secondary | ICD-10-CM | POA: Diagnosis present

## 2011-10-03 DIAGNOSIS — B182 Chronic viral hepatitis C: Secondary | ICD-10-CM | POA: Diagnosis present

## 2011-10-03 LAB — GLUCOSE, CAPILLARY
Glucose-Capillary: 94 mg/dL (ref 70–99)
Glucose-Capillary: 102 mg/dL — ABNORMAL HIGH (ref 70–99)
Glucose-Capillary: 294 mg/dL — ABNORMAL HIGH (ref 70–99)

## 2011-10-03 LAB — BASIC METABOLIC PANEL
BUN: 10 mg/dL (ref 6–23)
BUN: 8 mg/dL (ref 6–23)
CO2: 15 mEq/L — ABNORMAL LOW (ref 19–32)
Calcium: 8.1 mg/dL — ABNORMAL LOW (ref 8.4–10.5)
Calcium: 8.5 mg/dL (ref 8.4–10.5)
Calcium: 9.2 mg/dL (ref 8.4–10.5)
Chloride: 106 mEq/L (ref 96–112)
Creatinine, Ser: 0.37 mg/dL — ABNORMAL LOW (ref 0.50–1.10)
Creatinine, Ser: 0.43 mg/dL — ABNORMAL LOW (ref 0.50–1.10)
Creatinine, Ser: 0.44 mg/dL — ABNORMAL LOW (ref 0.50–1.10)
GFR calc Af Amer: >90 mL/min (ref >90)
GFR calc non Af Amer: >90 mL/min (ref >90)
GFR calc non Af Amer: >90 mL/min (ref >90)
Glucose, Bld: 214 mg/dL — ABNORMAL HIGH (ref 70–99)
Potassium: 3.9 mEq/L (ref 3.5–5.1)

## 2011-10-03 LAB — CBC
HCT: 40.2 % (ref 36.0–46.0)
Hemoglobin: 12.8 g/dL (ref 12.0–15.0)
MCV: 73 fL — ABNORMAL LOW (ref 78.0–100.0)
RBC: 5.51 MIL/uL — ABNORMAL HIGH (ref 3.87–5.11)
WBC: 12.2 10*3/uL — ABNORMAL HIGH (ref 4.0–10.5)

## 2011-10-03 MED ORDER — POTASSIUM CHLORIDE CRYS ER 20 MEQ PO TBCR
40.0000 meq | EXTENDED_RELEASE_TABLET | Freq: Every day | ORAL | Status: DC
  Administered 2011-10-03: 40 meq via ORAL
  Filled 2011-10-03 (×2): qty 2

## 2011-10-03 MED ORDER — INSULIN GLARGINE 100 UNIT/ML ~~LOC~~ SOLN
50.0000 [IU] | Freq: Every day | SUBCUTANEOUS | Status: DC
  Administered 2011-10-03: 50 [IU] via SUBCUTANEOUS

## 2011-10-03 MED ORDER — INSULIN GLARGINE 100 UNIT/ML ~~LOC~~ SOLN
20.0000 [IU] | Freq: Once | SUBCUTANEOUS | Status: AC
  Administered 2011-10-03: 20 [IU] via SUBCUTANEOUS

## 2011-10-03 MED ORDER — FENTANYL CITRATE 0.05 MG/ML IJ SOLN
50.0000 ug | Freq: Once | INTRAMUSCULAR | Status: AC
  Administered 2011-10-03: 50 ug via INTRAVENOUS
  Filled 2011-10-03: qty 2

## 2011-10-03 MED ORDER — INSULIN ASPART 100 UNIT/ML ~~LOC~~ SOLN
0.0000 [IU] | Freq: Three times a day (TID) | SUBCUTANEOUS | Status: DC
  Administered 2011-10-03: 8 [IU] via SUBCUTANEOUS
  Administered 2011-10-03: 3 [IU] via SUBCUTANEOUS

## 2011-10-03 MED ORDER — INSULIN ASPART 100 UNIT/ML ~~LOC~~ SOLN
6.0000 [IU] | Freq: Three times a day (TID) | SUBCUTANEOUS | Status: DC
  Administered 2011-10-03 (×3): 6 [IU] via SUBCUTANEOUS

## 2011-10-03 NOTE — Progress Notes (Signed)
UR completed 

## 2011-10-03 NOTE — ED Provider Notes (Signed)
Medical screening examination/treatment/procedure(s) were performed by non-physician practitioner and as supervising physician I was immediately available for consultation/collaboration.  Gina Costilla R. Nika Yazzie, MD 10/03/11 0702 

## 2011-10-03 NOTE — Progress Notes (Signed)
RN asking if information can be released on Pt to [employer], as per RN, Kathryne Sharper has an warrant for Pt's arrest and are requesting that RN notify Sheriff's Office prior to Pt's d/c.  Contacted ED CSW.  Per ED CSW, [facility] can confirm that Pt is admitted but that Sheriff's Office will need an order for additional information.  Relayed information to RN.  No additional SW needs identified.  CSW to sign off.  Providence Crosby, LCSWA Clinical Social Work [phone]

## 2011-10-03 NOTE — Progress Notes (Signed)
Patient ID: Mary [name], female   DOB: [birth year], 41 y.o.   MRN: [id number]  Subjective: No events overnight. Patient denies chest pain, shortness of breath, abdominal pain. Had bowel movement and reports ambulating.  Objective:  Vital signs in last 24 hours:  Filed Vitals:   10/02/11 2120 10/03/11 0043 10/03/11 0100 10/03/11 0541  BP: 99/67 120/84 121/83 98/64  Pulse: 64  66 82  Temp: 98.1 F (36.7 C)  97.8 F (36.6 C) 97.5 F (36.4 C)  TempSrc: Oral Oral Oral Oral  Resp: 18 18 18 18   Height:   5\' 4"  (1.626 m)   Weight:   47.7 kg (105 lb 2.6 oz)   SpO2: 100%  100% 98%    Intake/Output from previous day:   Intake/Output Summary (Last 24 hours) at 10/03/11 2116 Last data filed at 10/03/11 1858  Gross per 24 hour  Intake 1053.75 ml  Output      0 ml  Net 1053.75 ml    Physical Exam: General: Alert, awake, oriented x3, in no acute distress. HEENT: No bruits, no goiter. Moist mucous membranes, no scleral icterus, no conjunctival pallor. Heart: Regular rate and rhythm, S1/S2 +, no murmurs, rubs, gallops. Lungs: Clear to auscultation bilaterally. No wheezing, no rhonchi, no rales.  Abdomen: Soft, nontender, nondistended, positive bowel sounds. Extremities: No clubbing or cyanosis, no pitting edema,  positive pedal pulses. Neuro: Grossly nonfocal.  Lab Results:  Basic Metabolic Panel:    Component Value Date/Time   NA 135 10/03/2011 0645   K 3.0* 10/03/2011 0645   CL 106 10/03/2011 0645   CO2 15* 10/03/2011 0645   BUN 8 10/03/2011 0645   CREATININE 0.43* 10/03/2011 0645   GLUCOSE 97 10/03/2011 0645   CALCIUM 8.5 10/03/2011 0645   CBC:    Component Value Date/Time   WBC 12.2* 10/02/2011 2345   HGB 12.8 10/02/2011 2345   HCT 40.2 10/02/2011 2345   PLT 209 10/02/2011 2345   MCV 73.0* 10/02/2011 2345   NEUTROABS 4.8 10/02/2011 1849   LYMPHSABS 4.9* 10/02/2011 1849   MONOABS 0.6 10/02/2011 1849   EOSABS 0.1 10/02/2011 1849   BASOSABS 0.0 10/02/2011 1849      Lab  10/02/11 2345 10/02/11 1849 09/30/11 2325 09/30/11 2321  WBC 12.2* 10.4 -- 13.3*  HGB 12.8 14.9 16.0* 13.2  HCT 40.2 43.5 47.0* 42.4  PLT 209 228 -- 217  MCV 73.0* 73.4* -- 73.4*  MCH 23.2* 25.1* -- 22.8*  MCHC 31.8 34.3 -- 31.1  RDW 14.4 14.3 -- 14.4  LYMPHSABS -- 4.9* -- 6.3*  MONOABS -- 0.6 -- 0.9  EOSABS -- 0.1 -- 0.1  BASOSABS -- 0.0 -- 0.0  BANDABS -- -- -- --    Lab 10/03/11 0645 10/03/11 0255 10/02/11 2345 10/02/11 1849 09/30/11 2325 09/30/11 2321  NA 135 133* 135 134* 138 --  K 3.0* 3.8 3.9 4.6 4.2 --  CL 106 106 101 95* 107 --  CO2 15* 16* 17* 14* -- 14*  GLUCOSE 97 84 214* 254* 281* --  BUN 8 8 10 9 12  --  CREATININE 0.43* 0.37* 0.44* 0.49* 0.50 --  CALCIUM 8.5 8.1* 9.2 10.1 -- 10.0  MG -- -- -- -- -- --   Recent Results (from the past 240 hour(s))  URINE CULTURE     Status: Normal   Collection Time   09/30/11 11:38 PM      Component Value Range Status Comment   Specimen Description URINE, CLEAN CATCH   Final  Special Requests Normal   Final    Culture  Setup Time [id number]   Final    Colony Count NO GROWTH   Final    Culture NO GROWTH   Final    Report Status 10/02/2011 FINAL   Final     Studies/Results: No results found.  Medications: Scheduled Meds:   . aspirin EC  81 mg Oral Daily  . fentaNYL  100 mcg Intravenous Once  . fentaNYL  50 mcg Intravenous Once  . heparin  5,000 Units Subcutaneous Q8H  . insulin aspart  0-15 Units Subcutaneous TID WC  . insulin aspart  6 Units Subcutaneous TID WC  . insulin glargine  20 Units Subcutaneous Once  . insulin glargine  50 Units Subcutaneous QHS  . lithium carbonate  600 mg Oral QHS  . potassium chloride  10 mEq Intravenous Q1H  . QUEtiapine  50 mg Oral QHS  . sodium chloride  1,000 mL Intravenous Once  . DISCONTD: sodium chloride  1,000 mL Intravenous Once  . DISCONTD: insulin aspart  0-9 Units Subcutaneous TID WC  . DISCONTD: insulin glargine  50 Units Subcutaneous QHS  . DISCONTD: insulin regular   0-10 Units Intravenous TID WC   Continuous Infusions:   . sodium chloride    . DISCONTD: sodium chloride    . DISCONTD: sodium chloride    . DISCONTD: dextrose 5 % and 0.45% NaCl 75 mL/hr at 10/02/11 2218  . DISCONTD: dextrose 5 % and 0.45% NaCl 75 mL/hr at 10/03/11 0001  . DISCONTD: insulin (NOVOLIN-R) infusion    . DISCONTD: insulin (NOVOLIN-R) infusion 1.6 Units/hr (10/02/11 2218)  . DISCONTD: insulin (NOVOLIN-R) infusion 2.9 Units/hr (10/03/11 0008)   PRN Meds:.dextrose, DISCONTD: dextrose  Assessment/Plan:  Principal Problem:  *DKA, type 1 - clinically improving - will continue insulin as per home medication regimen - will continue to supplement potassium as indicated - mild AG persists  Active Problems:  Hep C w/o coma, chronic - appears to be medically stable for now   Bipolar 1 disorder - also appears to be medically stable for now   Abdominal pain - most likely secondary to DKA - now resolved   EDUCATION - test results and diagnostic studies were discussed with patient  - patient verbalized the understanding - questions were answered at the bedside and contact information was provided for additional questions or concerns   LOS: 1 day   MAGICK-Ladarion Munyon 10/03/2011, 9:16 PM  [facility], pager #: [phone] Main office number: [phone]

## 2011-10-03 NOTE — Progress Notes (Signed)
   CARE MANAGEMENT NOTE 10/03/2011  Patient:  Mary [name], Mary [name]   Account Number:  [id number]  Date Initiated:  10/03/2011  Documentation initiated by:  Raiford Noble  Subjective/Objective Assessment:   PT READMITTED FOR DKA     Action/Plan:   PT HAS NOW HAS ORANGE CARD-- CAN F/ U [facility] FOR MEDS. [facility] APPT PREVIOUSLY [facility] FOR 10-09-11 AT 1045AM   Anticipated DC Date:  10/04/2011   Anticipated DC Plan:  HOME/SELF CARE  In-house referral  Financial Counselor      DC Planning Services  CM consult  [facility]      [facility] Choice  NA   Choice offered to / List presented to:  NA   DME arranged  NA      DME agency  NA     HH arranged  NA      HH agency  NA   Status of service:  In process, will continue to follow Medicare Important Message given?   (If response is "NO", the following Medicare IM given date fields will be blank) Date Medicare IM given:   Date Additional Medicare IM given:    Discharge Disposition:  HOME/SELF CARE  Per UR Regulation:  Reviewed for med. necessity/level of care/duration of stay  If discussed at Long Length of Stay Meetings, dates discussed:    Comments:  COMMENTS_COL

## 2011-10-04 LAB — BASIC METABOLIC PANEL
BUN: 10 mg/dL (ref 6–23)
Chloride: 108 mEq/L (ref 96–112)
GFR calc Af Amer: >90 mL/min (ref >90)
GFR calc non Af Amer: >90 mL/min (ref >90)
Potassium: 3.4 mEq/L — ABNORMAL LOW (ref 3.5–5.1)
Sodium: 138 mEq/L (ref 135–145)

## 2011-10-04 LAB — CBC
HCT: 34.4 % — ABNORMAL LOW (ref 36.0–46.0)
RDW: 14.1 % (ref 11.5–15.5)
WBC: 11 10*3/uL — ABNORMAL HIGH (ref 4.0–10.5)

## 2011-10-04 LAB — GLUCOSE, CAPILLARY
Glucose-Capillary: 276 mg/dL — ABNORMAL HIGH (ref 70–99)
Glucose-Capillary: 63 mg/dL — ABNORMAL LOW (ref 70–99)

## 2011-10-04 MED ORDER — POTASSIUM CHLORIDE CRYS ER 20 MEQ PO TBCR
40.0000 meq | EXTENDED_RELEASE_TABLET | Freq: Two times a day (BID) | ORAL | Status: DC
  Administered 2011-10-04: 40 meq via ORAL
  Filled 2011-10-04 (×2): qty 2
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
## 2011-10-04 NOTE — Progress Notes (Signed)
Provided Pt with a bus pass.  No further needs identified.  CSW to sign off.  Providence Crosby, LCSWA Clinical Social Work [phone]

## 2011-10-04 NOTE — Discharge Instructions (Signed)
Blood Sugar Monitoring, Adult GLUCOSE METERS FOR SELF-MONITORING OF BLOOD GLUCOSE  It is important to be able to correctly measure your blood sugar (glucose). You can use a blood glucose monitor (a small battery-operated device) to check your glucose level at any time. This allows you and your caregiver to monitor your diabetes and to determine how well your treatment plan is working. The process of monitoring your blood glucose with a glucose meter is called self-monitoring of blood glucose (SMBG). When people with diabetes control their blood sugar, they have better health. To test for glucose with a typical glucose meter, place the disposable strip in the meter. Then place a small sample of blood on the "test strip." The test strip is coated with chemicals that combine with glucose in blood. The meter measures how much glucose is present. The meter displays the glucose level as a number. Several new models can record and store a number of test results. Some models can connect to personal computers to store test results or print them out.  Newer meters are often easier to use than older models. Some meters allow you to get blood from places other than your fingertip. Some new models have automatic timing, error codes, signals, or barcode readers to help with proper adjustment (calibration). Some meters have a large display screen or spoken instructions for people with visual impairments.  INSTRUCTIONS FOR USING GLUCOSE METERS  Wash your hands with soap and warm water, or clean the area with alcohol. Dry your hands completely.   Prick the side of your fingertip with a lancet (a sharp-pointed tool used by hand).   Hold the hand down and gently milk the finger until a small drop of blood appears. Catch the blood with the test strip.   Follow the instructions for inserting the test strip and using the SMBG meter. Most meters require the meter to be turned on and the test strip to be inserted before  applying the blood sample.   Record the test result.   Read the instructions carefully for both the meter and the test strips that go with it. Meter instructions are found in the user manual. Keep this manual to help you solve any problems that may arise. Many meters use "error codes" when there is a problem with the meter, the test strip, or the blood sample on the strip. You will need the manual to understand these error codes and fix the problem.   New devices are available such as laser lancets and meters that can test blood taken from "alternative sites" of the body, other than fingertips. However, you should use standard fingertip testing if your glucose changes rapidly. Also, use standard testing if:   You have eaten, exercised, or taken insulin in the past 2 hours.   You think your glucose is low.   You tend to not feel symptoms of low blood glucose (hypoglycemia).   You are ill or under stress.   Clean the meter as directed by the manufacturer.   Test the meter for accuracy as directed by the manufacturer.   Take your meter with you to your caregiver's office. This way, you can test your glucose in front of your caregiver to make sure you are using the meter correctly. Your caregiver can also take a sample of blood to test using a routine lab method. If values on the glucose meter are close to the lab results, you and your caregiver will see that your meter is working well  and you are using good technique. Your caregiver will advise you about what to do if the results do not match.  FREQUENCY OF TESTING  Your caregiver will tell you how often you should check your blood glucose. This will depend on your type of diabetes, your current level of diabetes control, and your types of medicines. The following are general guidelines, but your care plan may be different. Record all your readings and the time of day you took them for review with your caregiver.   Diabetes type 1.   When you  are using insulin with good diabetic control (either multiple daily injections or via a pump), you should check your glucose 4 times a day.   If your diabetes is not well controlled, you may need to monitor more frequently, including before meals and 2 hours after meals, at bedtime, and occasionally between 2 a.m. and 3 a.m.   You should always check your glucose before a dose of insulin or before changing the rate on your insulin pump.   Diabetes type 2.   Guidelines for SMBG in diabetes type 2 are not as well defined.   If you are on insulin, follow the guidelines above.   If you are on medicines, but not insulin, and your glucose is not well controlled, you should test at least twice daily.   If you are not on insulin, and your diabetes is controlled with medicines or diet alone, you should test at least once daily, usually before breakfast.   A weekly profile will help your caregiver advise you on your care plan. The week before your visit, check your glucose before a meal and 2 hours after a meal at least daily. You may want to test before and after a different meal each day so you and your caregiver can tell how well controlled your blood sugars are throughout the course of a 24 hour period.   Gestational diabetes (diabetes during pregnancy).   Frequent testing is often necessary. Accurate timing is important.   If you are not on insulin, check your glucose 4 times a day. Check it before breakfast and 1 hour after the start of each meal.   If you are on insulin, check your glucose 6 times a day. Check it before each meal and 1 hour after the first bite of each meal.   General guidelines.   More frequent testing is required at the start of insulin treatment. Your caregiver will instruct you.   Test your glucose any time you suspect you have low blood sugar (hypoglycemia).   You should test more often when you change medicines, when you have unusual stress or illness, or in other  unusual circumstances.  OTHER THINGS TO KNOW ABOUT GLUCOSE METERS  Measurement Range. Most glucose meters are able to read glucose levels over a broad range of values from as low as 0 to as high as 600 mg/dL. If you get an extremely high or low reading from your meter, you should first confirm it with another reading. Report very high or very low readings to your caregiver.   Whole Blood Glucose versus Plasma Glucose. Some older home glucose meters measure glucose in your whole blood. In a lab or when using some newer home glucose meters, the glucose is measured in your plasma (one component of blood). The difference can be important. It is important for you and your caregiver to know whether your meter gives its results as "whole blood equivalent" or "plasma  equivalent."   Display of High and Low Glucose Values. Part of learning how to operate a meter is understanding what the meter results mean. Know how high and low glucose concentrations are displayed on your meter.   Factors that Affect Glucose Meter Performance. The accuracy of your test results depends on many factors and varies depending on the brand and type of meter. These factors include:   Low red blood cell count (anemia).   Substances in your blood (such as uric acid, vitamin C, and others).   Environmental factors (temperature, humidity, altitude).   Name-brand versus generic test strips.   Calibration. Make sure your meter is set up properly. It is a good idea to do a calibration test with a control solution recommended by the manufacturer of your meter whenever you begin using a fresh bottle of test strips. This will help verify the accuracy of your meter.   Improperly stored, expired, or defective test strips. Keep your strips in a dry place with the lid on.   Soiled meter.   Inadequate blood sample.  NEW TECHNOLOGIES FOR GLUCOSE TESTING Alternative site testing Some glucose meters allow testing blood from alternative  sites. These include the:  Upper arm.   Forearm.   Base of the thumb.   Thigh.  Sampling blood from alternative sites may be desirable. However, it may have some limitations. Blood in the fingertips show changes in glucose levels more quickly than blood in other parts of the body. This means that alternative site test results may be different from fingertip test results, not because of the meter's ability to test accurately, but because the actual glucose concentration can be different.  Continuous Glucose Monitoring Devices to measure your blood glucose continuously are available, and others are in development. These methods can be more expensive than self-monitoring with a glucose meter. However, it is uncertain how effective and reliable these devices are. Your caregiver will advise you if this approach makes sense for you. IF BLOOD SUGARS ARE CONTROLLED, PEOPLE WITH DIABETES REMAIN HEALTHIER.  SMBG is an important part of the treatment plan of patients with diabetes mellitus. Below are reasons for using SMBG:   It confirms that your glucose is at a specific, healthy level.   It detects hypoglycemia and severe hyperglycemia.   It allows you and your caregiver to make adjustments in response to changes in lifestyle for individuals requiring medicine.   It determines the need for starting insulin therapy in temporary diabetes that happens during pregnancy (gestational diabetes).  Document Released: 06/06/2003 Document Revised: 05/23/2011 Document Reviewed: 09/27/2010 [facility] Patient Information 2012 [place], [place].Diabetic Ketoacidosis Diabetic ketoacidosis (DKA) is a life-threatening complication of type 1 diabetes. It must be quickly recognized and treated. Treatment requires hospitalization. CAUSES  When there is no insulin in the body, glucose (sugar) cannot be used and the body breaks down fat for energy. When fat breaks down, acids (ketones) build up in the blood. Very high levels  of glucose and high levels of acids lead to severe loss of body fluids (dehydration) and other dangerous chemical changes. This stresses your vital organs and can cause coma or death. SYMPTOMS   Tiredness (fatigue).   Weight loss.   Excessive thirst.   Ketones in the urine.   Lightheadedness.   Fruity or sweet smell on your breath.   Excessive urination.   Visual changes.   Confusion or irritability.   Feeling sick to your stomach (nauseous) or vomiting.   Rapid breathing.   Stomachache  or belly (abdominal) pain.  DIAGNOSIS  Your caregiver will diagnose DKA based on your history, physical exam, and blood tests. Your caregiver will check if there is another illness present which caused you to go into DKA. Most of this will be done quickly in an emergency room. TREATMENT   Fluid replacement to correct dehydration.   Insulin.   Correction of electrolytes, such as potassium and sodium.   Medicines (antibiotics) that kill germs for infections.  PREVENTION  Always take your insulin. Do not skip your insulin injections.   If you are ill, treat yourself quickly. Your body often needs more insulin to fight the illness.   Check your blood glucose regularly.   Check urine ketones if your blood glucose is greater than 240 milligrams per deciliter (mg/dl).   Do not used expired or outdated insulin.   If your blood glucose is high, drink plenty of fluids. This helps flush out ketones.  HOME CARE INSTRUCTIONS   If you are ill, follow the advice of your caregiver.   To prevent loss of body fluids (dehydration), drink enough water and fluids to keep your urine clear or pale yellow.   If you cannot eat, alternate between drinking fluids with sugar (soda, juices, flavored gelatin) and salty fluids (broth, bouillon).   If you can eat, follow your usual diet and drink sugar-free liquids (water, diet drinks).   Always take your usual dose of insulin. If you cannot eat, or your  glucose is getting too low, call your caregiver for further instructions.   Continue to monitor your blood or urine ketones every 3 to 4 hours around the clock. Set your alarm clock or have someone wake you up. If you are too sick, have someone test it for you.   Rest and avoid exercise.  SEEK MEDICAL CARE IF:   You have ketones in your urine or your blood glucose is higher than a level your caregiver suggests. You may need extra insulin. Call your caregiver if you need advice on adjusting your insulin.   You cannot drink at least a tablespoon of fluid every 15 to 20 minutes.   You have been throwing up for more than 2 hours.   You have symptoms of DKA:   Fruity smelling breath.   Breathing faster or slower.   Becoming very sleepy.  SEEK IMMEDIATE MEDICAL CARE IF:   You have signs of dehydration:   Decreased urination.   Increased thirst.   Dry skin and mouth.   Lightheadedness.   Your blood glucose is very high (as advised by your caregiver) twice in a row.   You or your child has an oral temperature above 102 F (38.9 C), not controlled by medicine.   You pass out.   You have chest pain and/or trouble breathing.   You have a sudden, severe headache.   You have sudden weakness in one arm and/or one leg.   You have sudden difficulty speaking and/or swallowing.   You develop vomiting and/or diarrhea that is getting worse after 3 to 4 hours.   You have abdominal pain.  MAKE SURE YOU:   Understand these instructions.   Will watch your condition.   Will get help right away if you are not doing well or get worse.  Document Released: 05/31/2000 Document Revised: 05/23/2011 Document Reviewed: 12/07/2008 [facility] Patient Information 2012 [place], [place].

## 2011-10-04 NOTE — Progress Notes (Signed)
Patient discharged home in stable condition.  Discharged instructions given and understood.  Diabetic teaching and instructions given as well with understanding.

## 2011-10-04 NOTE — ED Provider Notes (Signed)
Medical screening examination/treatment/procedure(s) were performed by non-physician practitioner and as supervising physician I was immediately available for consultation/collaboration.    Jamaia Brum L Lacreshia Bondarenko, MD 10/04/11 2035 

## 2012-03-15 ENCOUNTER — Inpatient Hospital Stay (HOSPITAL_COMMUNITY): Payer: Medicaid Other

## 2012-03-15 ENCOUNTER — Inpatient Hospital Stay (HOSPITAL_COMMUNITY)
Admission: EM | Admit: 2012-03-15 | Discharge: 2012-03-17 | DRG: 638 | Disposition: A | Discharge status: Home / Self Care | Payer: Medicaid Other | Attending: Internal Medicine | Admitting: Internal Medicine

## 2012-03-15 ENCOUNTER — Encounter (HOSPITAL_COMMUNITY): Payer: Self-pay | Admitting: *Deleted

## 2012-03-15 DIAGNOSIS — F3189 Other bipolar disorder: Secondary | ICD-10-CM | POA: Diagnosis present

## 2012-03-15 DIAGNOSIS — A599 Trichomoniasis, unspecified: Secondary | ICD-10-CM | POA: Diagnosis present

## 2012-03-15 DIAGNOSIS — R109 Unspecified abdominal pain: Secondary | ICD-10-CM | POA: Diagnosis present

## 2012-03-15 DIAGNOSIS — E1110 Type 2 diabetes mellitus with ketoacidosis without coma: Secondary | ICD-10-CM | POA: Diagnosis not present

## 2012-03-15 DIAGNOSIS — Z79899 Other long term (current) drug therapy: Secondary | ICD-10-CM | POA: Diagnosis not present

## 2012-03-15 DIAGNOSIS — Z91148 Patient's other noncompliance with medication regimen for other reason: Secondary | ICD-10-CM

## 2012-03-15 DIAGNOSIS — G40909 Epilepsy, unspecified, not intractable, without status epilepticus: Secondary | ICD-10-CM | POA: Diagnosis present

## 2012-03-15 DIAGNOSIS — F3181 Bipolar II disorder: Secondary | ICD-10-CM | POA: Diagnosis present

## 2012-03-15 DIAGNOSIS — E876 Hypokalemia: Secondary | ICD-10-CM | POA: Diagnosis present

## 2012-03-15 DIAGNOSIS — F319 Bipolar disorder, unspecified: Secondary | ICD-10-CM

## 2012-03-15 DIAGNOSIS — Z91199 Patient's noncompliance with other medical treatment and regimen due to unspecified reason: Secondary | ICD-10-CM

## 2012-03-15 DIAGNOSIS — Z794 Long term (current) use of insulin: Secondary | ICD-10-CM

## 2012-03-15 DIAGNOSIS — E1010 Type 1 diabetes mellitus with ketoacidosis without coma: Principal | ICD-10-CM | POA: Diagnosis present

## 2012-03-15 DIAGNOSIS — F17200 Nicotine dependence, unspecified, uncomplicated: Secondary | ICD-10-CM | POA: Diagnosis present

## 2012-03-15 DIAGNOSIS — G609 Hereditary and idiopathic neuropathy, unspecified: Secondary | ICD-10-CM | POA: Diagnosis present

## 2012-03-15 DIAGNOSIS — F4310 Post-traumatic stress disorder, unspecified: Secondary | ICD-10-CM | POA: Diagnosis present

## 2012-03-15 DIAGNOSIS — Z7982 Long term (current) use of aspirin: Secondary | ICD-10-CM | POA: Diagnosis not present

## 2012-03-15 DIAGNOSIS — Z9114 Patient's other noncompliance with medication regimen: Secondary | ICD-10-CM

## 2012-03-15 DIAGNOSIS — A598 Trichomoniasis of other sites: Secondary | ICD-10-CM | POA: Diagnosis present

## 2012-03-15 DIAGNOSIS — E860 Dehydration: Secondary | ICD-10-CM | POA: Diagnosis present

## 2012-03-15 DIAGNOSIS — Z720 Tobacco use: Secondary | ICD-10-CM | POA: Diagnosis present

## 2012-03-15 DIAGNOSIS — B182 Chronic viral hepatitis C: Secondary | ICD-10-CM | POA: Diagnosis present

## 2012-03-15 DIAGNOSIS — R002 Palpitations: Secondary | ICD-10-CM | POA: Diagnosis present

## 2012-03-15 DIAGNOSIS — Z9119 Patient's noncompliance with other medical treatment and regimen: Secondary | ICD-10-CM | POA: Diagnosis not present

## 2012-03-15 DIAGNOSIS — I739 Peripheral vascular disease, unspecified: Secondary | ICD-10-CM | POA: Diagnosis present

## 2012-03-15 DIAGNOSIS — D509 Iron deficiency anemia, unspecified: Secondary | ICD-10-CM | POA: Diagnosis present

## 2012-03-15 DIAGNOSIS — E1065 Type 1 diabetes mellitus with hyperglycemia: Secondary | ICD-10-CM | POA: Diagnosis present

## 2012-03-15 DIAGNOSIS — IMO0002 Reserved for concepts with insufficient information to code with codable children: Secondary | ICD-10-CM | POA: Diagnosis present

## 2012-03-15 DIAGNOSIS — N183 Chronic kidney disease, stage 3 unspecified: Secondary | ICD-10-CM | POA: Diagnosis present

## 2012-03-15 LAB — BASIC METABOLIC PANEL
BUN: 13 mg/dL (ref 6–23)
Chloride: 107 mEq/L (ref 96–112)
Creatinine, Ser: 0.42 mg/dL — ABNORMAL LOW (ref 0.50–1.10)
GFR calc Af Amer: >90 mL/min (ref >90)
GFR calc non Af Amer: >90 mL/min (ref >90)
Potassium: 3.4 mEq/L — ABNORMAL LOW (ref 3.5–5.1)

## 2012-03-15 LAB — GLUCOSE, CAPILLARY
Glucose-Capillary: 248 mg/dL — ABNORMAL HIGH (ref 70–99)
Glucose-Capillary: 193 mg/dL — ABNORMAL HIGH (ref 70–99)
Glucose-Capillary: 152 mg/dL — ABNORMAL HIGH (ref 70–99)

## 2012-03-15 LAB — COMPREHENSIVE METABOLIC PANEL
ALT: 37 U/L — ABNORMAL HIGH (ref 0–35)
Alkaline Phosphatase: 107 U/L (ref 39–117)
BUN: 14 mg/dL (ref 6–23)
CO2: 19 mEq/L (ref 19–32)
Chloride: 100 mEq/L (ref 96–112)
GFR calc Af Amer: >90 mL/min (ref >90)
GFR calc non Af Amer: >90 mL/min (ref >90)
Glucose, Bld: 375 mg/dL — ABNORMAL HIGH (ref 70–99)
Potassium: 4.2 mEq/L (ref 3.5–5.1)
Sodium: 141 mEq/L (ref 135–145)
Total Bilirubin: 0.5 mg/dL (ref 0.3–1.2)

## 2012-03-15 LAB — URINE MICROSCOPIC-ADD ON

## 2012-03-15 LAB — CBC
HCT: 39.8 % (ref 36.0–46.0)
MCHC: 32.4 g/dL (ref 30.0–36.0)
Platelets: 156 10*3/uL (ref 150–400)
Platelets: 152 10*3/uL (ref 150–400)
RBC: 4.89 MIL/uL (ref 3.87–5.11)
RDW: 15.6 % — ABNORMAL HIGH (ref 11.5–15.5)
RDW: 15.7 % — ABNORMAL HIGH (ref 11.5–15.5)
WBC: 9.2 10*3/uL (ref 4.0–10.5)
WBC: 10.6 10*3/uL — ABNORMAL HIGH (ref 4.0–10.5)

## 2012-03-15 LAB — URINALYSIS, ROUTINE W REFLEX MICROSCOPIC
Bilirubin Urine: NEGATIVE
Nitrite: NEGATIVE
Protein, ur: NEGATIVE mg/dL
Specific Gravity, Urine: 1.037 — ABNORMAL HIGH (ref 1.005–1.030)
Urobilinogen, UA: 0.2 mg/dL (ref 0.0–1.0)

## 2012-03-15 LAB — PREGNANCY, URINE: Preg Test, Ur: NEGATIVE

## 2012-03-15 LAB — LIPASE, BLOOD: Lipase: 20 U/L (ref 11–59)

## 2012-03-15 LAB — TROPONIN I: Troponin I: <0.3 ng/mL (ref <0.30)

## 2012-03-15 MED ORDER — SODIUM CHLORIDE 0.9 % IV BOLUS (SEPSIS)
1000.0000 mL | Freq: Once | INTRAVENOUS | Status: AC
  Administered 2012-03-15: 1000 mL via INTRAVENOUS

## 2012-03-15 MED ORDER — ONDANSETRON HCL 4 MG/2ML IJ SOLN: 4.0000 mg | Freq: Four times a day (QID) | INTRAMUSCULAR | Status: DC | PRN

## 2012-03-15 MED ORDER — NICOTINE 7 MG/24HR TD PT24
7.0000 mg | MEDICATED_PATCH | Freq: Every day | TRANSDERMAL | Status: DC
  Filled 2012-03-15 (×2): qty 1

## 2012-03-15 MED ORDER — ONDANSETRON HCL 4 MG/2ML IJ SOLN
4.0000 mg | Freq: Three times a day (TID) | INTRAMUSCULAR | Status: DC | PRN
  Administered 2012-03-15: 4 mg via INTRAVENOUS
  Filled 2012-03-15: qty 2

## 2012-03-15 MED ORDER — SODIUM CHLORIDE 0.9 % IV SOLN
INTRAVENOUS | Status: AC
  Administered 2012-03-15: 17:00:00 via INTRAVENOUS

## 2012-03-15 MED ORDER — ONDANSETRON HCL 4 MG/2ML IJ SOLN
4.0000 mg | Freq: Once | INTRAMUSCULAR | Status: AC
  Administered 2012-03-15: 4 mg via INTRAVENOUS
  Filled 2012-03-15: qty 2

## 2012-03-15 MED ORDER — MORPHINE SULFATE 2 MG/ML IJ SOLN
1.0000 mg | INTRAMUSCULAR | Status: DC | PRN
  Administered 2012-03-15: 2 mg via INTRAVENOUS
  Filled 2012-03-15: qty 1

## 2012-03-15 MED ORDER — INSULIN REGULAR BOLUS VIA INFUSION
0.0000 [IU] | Freq: Three times a day (TID) | INTRAVENOUS | Status: DC
  Filled 2012-03-15: qty 10

## 2012-03-15 MED ORDER — LITHIUM CARBONATE ER 300 MG PO TBCR
600.0000 mg | EXTENDED_RELEASE_TABLET | Freq: Every day | ORAL | Status: DC
  Administered 2012-03-15 – 2012-03-16 (×2): 600 mg via ORAL
  Filled 2012-03-15 (×3): qty 2

## 2012-03-15 MED ORDER — GABAPENTIN 300 MG PO CAPS
300.0000 mg | ORAL_CAPSULE | Freq: Two times a day (BID) | ORAL | Status: DC
  Administered 2012-03-15 – 2012-03-17 (×4): 300 mg via ORAL
  Filled 2012-03-15 (×5): qty 1

## 2012-03-15 MED ORDER — DEXTROSE-NACL 5-0.45 % IV SOLN: INTRAVENOUS | Status: DC

## 2012-03-15 MED ORDER — DEXTROSE-NACL 5-0.45 % IV SOLN
INTRAVENOUS | Status: DC
  Administered 2012-03-15: 125 mL via INTRAVENOUS
  Administered 2012-03-16: 22:00:00 via INTRAVENOUS
  Administered 2012-03-16: 1000 mL via INTRAVENOUS
  Administered 2012-03-17: 06:00:00 via INTRAVENOUS

## 2012-03-15 MED ORDER — INSULIN ASPART 100 UNIT/ML ~~LOC~~ SOLN
0.0000 [IU] | Freq: Three times a day (TID) | SUBCUTANEOUS | Status: DC
  Administered 2012-03-16 – 2012-03-17 (×2): 3 [IU] via SUBCUTANEOUS
  Administered 2012-03-17: 2 [IU] via SUBCUTANEOUS

## 2012-03-15 MED ORDER — ASPIRIN EC 81 MG PO TBEC
81.0000 mg | DELAYED_RELEASE_TABLET | Freq: Every day | ORAL | Status: DC
  Administered 2012-03-16 – 2012-03-17 (×2): 81 mg via ORAL
  Filled 2012-03-15 (×3): qty 1

## 2012-03-15 MED ORDER — HYDROMORPHONE HCL PF 1 MG/ML IJ SOLN
0.5000 mg | Freq: Once | INTRAMUSCULAR | Status: AC
  Administered 2012-03-15: 0.5 mg via INTRAVENOUS
  Filled 2012-03-15: qty 1

## 2012-03-15 MED ORDER — ACETAMINOPHEN 325 MG PO TABS: 650.0000 mg | ORAL_TABLET | ORAL | Status: DC | PRN

## 2012-03-15 MED ORDER — SODIUM CHLORIDE 0.9 % IV SOLN
INTRAVENOUS | Status: DC
  Administered 2012-03-16 (×2): via INTRAVENOUS

## 2012-03-15 MED ORDER — OXYCODONE HCL 5 MG PO TABS
5.0000 mg | ORAL_TABLET | ORAL | Status: DC | PRN
  Administered 2012-03-16 – 2012-03-17 (×3): 5 mg via ORAL
  Filled 2012-03-15 (×3): qty 1

## 2012-03-15 MED ORDER — SODIUM CHLORIDE 0.9 % IV SOLN: INTRAVENOUS | Status: DC

## 2012-03-15 MED ORDER — PANTOPRAZOLE SODIUM 40 MG IV SOLR
40.0000 mg | Freq: Once | INTRAVENOUS | Status: AC
  Administered 2012-03-15: 40 mg via INTRAVENOUS
  Filled 2012-03-15 (×2): qty 40

## 2012-03-15 MED ORDER — POLYETHYLENE GLYCOL 3350 17 G PO PACK
17.0000 g | PACK | Freq: Every day | ORAL | Status: DC | PRN
  Filled 2012-03-15: qty 1

## 2012-03-15 MED ORDER — QUETIAPINE FUMARATE 50 MG PO TABS
50.0000 mg | ORAL_TABLET | Freq: Every day | ORAL | Status: DC
  Administered 2012-03-15 – 2012-03-16 (×2): 50 mg via ORAL
  Filled 2012-03-15 (×3): qty 1

## 2012-03-15 MED ORDER — DEXTROSE 50 % IV SOLN
25.0000 mL | INTRAVENOUS | Status: DC | PRN
  Filled 2012-03-15 (×2): qty 50

## 2012-03-15 MED ORDER — DEXTROSE 50 % IV SOLN: 25.0000 mL | INTRAVENOUS | Status: DC | PRN

## 2012-03-15 MED ORDER — POTASSIUM CHLORIDE 10 MEQ/100ML IV SOLN
10.0000 meq | INTRAVENOUS | Status: AC
  Administered 2012-03-15 (×3): 10 meq via INTRAVENOUS
  Filled 2012-03-15: qty 300

## 2012-03-15 MED ORDER — PANTOPRAZOLE SODIUM 40 MG IV SOLR
40.0000 mg | INTRAVENOUS | Status: DC
  Filled 2012-03-15: qty 40

## 2012-03-15 MED ORDER — SODIUM CHLORIDE 0.9 % IV SOLN
INTRAVENOUS | Status: DC
  Administered 2012-03-15: 2.9 [IU]/h via INTRAVENOUS
  Administered 2012-03-16: 1.7 [IU]/h via INTRAVENOUS
  Filled 2012-03-15: qty 1

## 2012-03-15 MED ORDER — SODIUM CHLORIDE 0.9 % IV SOLN: INTRAVENOUS | Status: AC

## 2012-03-15 MED ORDER — ENOXAPARIN SODIUM 40 MG/0.4ML ~~LOC~~ SOLN
40.0000 mg | SUBCUTANEOUS | Status: DC
  Administered 2012-03-15 – 2012-03-16 (×2): 40 mg via SUBCUTANEOUS
  Filled 2012-03-15 (×3): qty 0.4

## 2012-03-15 NOTE — ED Notes (Signed)
2 RNs attempted IV access multiple times, IV team paged and is on the way

## 2012-03-15 NOTE — ED Notes (Signed)
Per ems: pt from home, episode of n/v and abdominal pain radiating to lower back began this morning. Hx of DM, Hep C. Reports similar symptoms few months ago, pt dx with DKA. bp 118 palpated, pulse 70, respirations 20, CBG 318

## 2012-03-15 NOTE — ED Provider Notes (Addendum)
History     CSN: [id number]  Arrival date & time 03/15/12  1231   First MD Initiated Contact with Patient 03/15/12 1245      Chief Complaint  Patient presents with  . Abdominal Pain  . Emesis    (Consider location/radiation/quality/duration/timing/severity/associated sxs/prior treatment) The history is provided by the patient.  pt c/o nv for past day. Hx iddm. States has been non compliant w insulin. Emesis clear,  Episodic, not bloody or bilious. Having normal bms, no diarrhea or constipation. Mid abd crampy pain, no specific exacerbating or alleviating factors. No fever or chills. No gu c/o. Denies cough or uri c/o. No cp. Blood sugar high in ed. Pt notes similar symptoms in past w dka.      Past Medical History  Diagnosis Date  . Diabetes mellitus     diagnosed in 1996-always been on insulin  . Kidney stone   . Seizure disorder     started with pregnancy of first son  . Gastritis   . Bipolar 2 disorder   . Post traumatic stress disorder     "flipping out" after people close to her died  . DKA (diabetic ketoacidoses)     Recurrent admissions for DKA, medication non-complaince, poor social situation  . Cirrhosis   . Angina   . Heart murmur   . Shortness of breath     occassionally  . Seizures     pt states 23yrs ago  . Headache     occassionally  . Arthritis     r ankle-S/P surgery 2005/11/17)  . Anxiety   . Peripheral vascular disease     numbness bilaterally feet  . Anemia     childhood  . H/O hiatal hernia     2 yrs ago  . Depression     childhood  . Hep C w/o coma, chronic     biopsy in 2010-no rx-was supposed to see a hepatologist in [place]  . Bipolar disorder   . PTSD (post-traumatic stress disorder)   . Neuropathy     in legs and feet and hands    Past Surgical History  Procedure Date  . Ankle surgery   . Cesarean section     Family History  Problem Relation Age of Onset  . Diabetes Mother     currently 29  . Fibromyalgia Mother   .  Cirrhosis Father     died in 1997-11-17    History  Substance Use Topics  . Smoking status: Former Smoker -- 0.2 packs/day for 25 years    Quit date: 10/02/2011  . Smokeless tobacco: Never Used  . Alcohol Use: No     Endorses hasn't been drinking    OB History    Grav Para Term Preterm Abortions TAB SAB Ect Mult Living                  Review of Systems  Constitutional: Negative for fever and chills.  HENT: Negative for neck pain.   Eyes: Negative for redness.  Respiratory: Negative for cough and shortness of breath.   Cardiovascular: Negative for chest pain.  Gastrointestinal: Positive for vomiting and abdominal pain.  Genitourinary: Negative for dysuria and flank pain.  Musculoskeletal: Negative for back pain.  Skin: Negative for rash.  Neurological: Negative for headaches.  Hematological: Does not bruise/bleed easily.  Psychiatric/Behavioral: Negative for confusion.    Allergies  Penicillins  Home Medications   Current Outpatient Rx  Name Route Sig Dispense Refill  . ASPIRIN  EC 81 MG PO TBEC Oral Take 81 mg by mouth daily.     Marland Kitchen GABAPENTIN 300 MG PO CAPS Oral Take 300 mg by mouth 2 (two) times daily.    . INSULIN ASPART 100 UNIT/ML [place] SOLN Subcutaneous Inject 6 Units into the skin 3 (three) times daily with meals. 1 vial 0  . INSULIN GLARGINE 100 UNIT/ML [place] SOLN Subcutaneous Inject 50 Units into the skin at bedtime. 10 mL 0  . LITHIUM CARBONATE ER 300 MG PO TBCR Oral Take 600 mg by mouth at bedtime.    Marland Kitchen QUETIAPINE FUMARATE 50 MG PO TABS Oral Take 50 mg by mouth at bedtime.      BP 132/82  Pulse 88  Temp 97.8 F (36.6 C) (Oral)  Resp 20  SpO2 99%  LMP 02/16/2012  Physical Exam  Nursing note and vitals reviewed. Constitutional: She is oriented to person, place, and time. She appears well-developed and well-nourished. No distress.  HENT:  Nose: Nose normal.  Mouth/Throat: Oropharynx is clear and moist.  Eyes: Conjunctivae normal are normal. No scleral  icterus.  Neck: Neck supple. No tracheal deviation present.       No stiffness or rigidity  Cardiovascular: Normal rate, regular rhythm, normal heart sounds and intact distal pulses.  Exam reveals no gallop and no friction rub.   No murmur heard. Pulmonary/Chest: Effort normal and breath sounds normal. No respiratory distress.  Abdominal: Soft. Normal appearance and bowel sounds are normal. She exhibits no distension and no mass. There is tenderness. There is no rebound and no guarding.       Mid abd tenderness, no rebound or guarding.   Genitourinary:       No cva tenderness  Musculoskeletal: She exhibits no edema.  Neurological: She is alert and oriented to person, place, and time.  Skin: Skin is warm and dry. No rash noted.    ED Course  Procedures (including critical care time)  Labs Reviewed  GLUCOSE, CAPILLARY - Abnormal; Notable for the following:    Glucose-Capillary 375 (*)     All other components within normal limits  GLUCOSE, CAPILLARY - Abnormal; Notable for the following:    Glucose-Capillary 362 (*)     All other components within normal limits  PREGNANCY, URINE  COMPREHENSIVE METABOLIC PANEL  CBC  URINALYSIS, ROUTINE W REFLEX MICROSCOPIC  LIPASE, BLOOD  KETONES, QUALITATIVE   Results for orders placed during the hospital encounter of 03/15/12  GLUCOSE, CAPILLARY      Component Value Range   Glucose-Capillary 375 (*) 70 - 99 mg/dL  GLUCOSE, CAPILLARY      Component Value Range   Glucose-Capillary 362 (*) 70 - 99 mg/dL  COMPREHENSIVE METABOLIC PANEL      Component Value Range   Sodium 141  135 - 145 mEq/L   Potassium 4.2  3.5 - 5.1 mEq/L   Chloride 100  96 - 112 mEq/L   CO2 19  19 - 32 mEq/L   Glucose, Bld 375 (*) 70 - 99 mg/dL   BUN 14  6 - 23 mg/dL   Creatinine, Ser 6.21 (*) 0.50 - 1.10 mg/dL   Calcium 30.8 (*) 8.4 - 10.5 mg/dL   Total Protein 8.5 (*) 6.0 - 8.3 g/dL   Albumin 4.4  3.5 - 5.2 g/dL   AST 40 (*) 0 - 37 U/L   ALT 37 (*) 0 - 35 U/L    Alkaline Phosphatase 107  39 - 117 U/L   Total Bilirubin 0.5  0.3 -  1.2 mg/dL   GFR calc non Af Amer >90  >90 mL/min   GFR calc Af Amer >90  >90 mL/min  CBC      Component Value Range   WBC 9.2  4.0 - 10.5 K/uL   RBC 5.66 (*) 3.87 - 5.11 MIL/uL   Hemoglobin 12.9  12.0 - 15.0 g/dL   HCT 09.8  11.9 - 14.7 %   MCV 70.3 (*) 78.0 - 100.0 fL   MCH 22.8 (*) 26.0 - 34.0 pg   MCHC 32.4  30.0 - 36.0 g/dL   RDW 82.9 (*) 56.2 - 13.0 %   Platelets 156  150 - 400 K/uL  URINALYSIS, ROUTINE W REFLEX MICROSCOPIC      Component Value Range   Color, Urine YELLOW  YELLOW   APPearance CLEAR  CLEAR   Specific Gravity, Urine 1.037 (*) 1.005 - 1.030   pH 6.0  5.0 - 8.0   Glucose, UA >1000 (*) NEGATIVE mg/dL   Hgb urine dipstick TRACE (*) NEGATIVE   Bilirubin Urine NEGATIVE  NEGATIVE   Ketones, ur >80 (*) NEGATIVE mg/dL   Protein, ur NEGATIVE  NEGATIVE mg/dL   Urobilinogen, UA 0.2  0.0 - 1.0 mg/dL   Nitrite NEGATIVE  NEGATIVE   Leukocytes, UA NEGATIVE  NEGATIVE  PREGNANCY, URINE      Component Value Range   Preg Test, Ur NEGATIVE  NEGATIVE  LIPASE, BLOOD      Component Value Range   Lipase 20  11 - 59 U/L  KETONES, QUALITATIVE      Component Value Range   Acetone, Bld SMALL (*) NEGATIVE  LITHIUM LEVEL      Component Value Range   Lithium Lvl <0.25 (*) 0.80 - 1.40 mEq/L  URINE MICROSCOPIC-ADD ON      Component Value Range   Squamous Epithelial / LPF FEW (*) RARE   WBC, UA 3-6  <3 WBC/hpf   RBC / HPF 0-2  <3 RBC/hpf   Bacteria, UA FEW (*) RARE   Urine-Other TRICHOMONAS PRESENT          MDM  Iv ns bolus. zofran iv. protonix iv.   Reviewed nursing notes and prior charts for additional history.   Additional ns iv bolus.  Insulin drip via glucose stabilizer.  Recheck abd soft nt.  Recheck pt feels mildly improved.  hospitalist service called to admit. Discussed pt.    CRITICAL CARE Performed by: Suzi Roots   Total critical care time: 40  Critical care time was  exclusive of separately billable procedures and treating other patients.  Critical care was necessary to treat or prevent imminent or life-threatening deterioration.  Critical care was time spent personally by me on the following activities: development of treatment plan with patient and/or surrogate as well as nursing, discussions with consultants, evaluation of patient's response to treatment, examination of patient, obtaining history from patient or surrogate, ordering and performing treatments and interventions, ordering and review of laboratory studies, ordering and review of radiographic studies, pulse oximetry and re-evaluation of patient's condition.  [facility] states step down, team 4.         Suzi Roots, MD 03/15/12 1615  Suzi Roots, MD 03/15/12 [phone]

## 2012-03-15 NOTE — ED Notes (Signed)
IHK:[id number]> Expected date:03/15/12<BR> Expected time:12:23 PM<BR> Means of arrival:Ambulance<BR> Comments:<BR> abd pain

## 2012-03-15 NOTE — H&P (Signed)
[facility] History and Physical  Mary [name] ZHY:[id number] DOB: [birth year] DOA: 03/15/2012  Referring physician: Dr. Denton Lank PCP: Georganna Skeans, MD   Chief Complaint: Abdominal pain  HPI: Mary [name] is a 41 y.o. female with a history of medication noncompliance, type 1 diabetes with multiple admissions for DKA secondary to medication noncompliance, history of seizure disorder, history of bipolar disorder, history of cirrhosis, anxiety, PTSD who presents to the ED with a several hour history of pain all over, nausea or vomiting. Patient states he woke up at 2 AM on the morning of admission feeling sick nauseous with vomiting and abdominal and back pain and pain everywhere. Patient endorses some chills. Patient denies any fever, patient does endorse some chest tightness which she refuses to elaborate on her she states she is in pain all over and screaming for pain medications. Patient also endorses some weakness. Patient denies any shortness of breath, no diarrhea, no constipation. Patient states his usual symptoms prior to developing DKA. Patient states has not been on any of her Lantus to NovoLog in several months due to the fact that she can't afford it and that PCP is working with her to be able to get the insulin. Patient was seen in the ED was noted to have a glucose of 375. He was also noted to have ketones in her urine, acetone in the blood, and anion gap of 22 with a bicarbonate of 19. Patient was placed on a glucose stabilizer given some IV fluids and will call to admit the patient for further evaluation and management. No x-rays were done at this time.   Review of Systems: The patient denies anorexia, fever, weight loss,, vision loss, decreased hearing, hoarseness, chest pain, syncope, dyspnea on exertion, peripheral edema, balance deficits, hemoptysis, abdominal pain, melena, hematochezia, severe indigestion/heartburn, hematuria, incontinence, genital sores, muscle weakness,  suspicious skin lesions, transient blindness, difficulty walking, depression, unusual weight change, abnormal bleeding, enlarged lymph nodes, angioedema, and breast masses.   Past Medical History  Diagnosis Date  . Diabetes mellitus     diagnosed in 1996-always been on insulin  . Kidney stone   . Seizure disorder     started with pregnancy of first son  . Gastritis   . Bipolar 2 disorder   . Post traumatic stress disorder     "flipping out" after people close to her died  . DKA (diabetic ketoacidoses)     Recurrent admissions for DKA, medication non-complaince, poor social situation  . Cirrhosis   . Angina   . Heart murmur   . Shortness of breath     occassionally  . Seizures     pt states 90yrs ago  . Headache     occassionally  . Arthritis     r ankle-S/P surgery (2007)  . Anxiety   . Peripheral vascular disease     numbness bilaterally feet  . Anemia     childhood  . H/O hiatal hernia     2 yrs ago  . Depression     childhood  . Hep C w/o coma, chronic     biopsy in 2010-no rx-was supposed to see a hepatologist in [place]  . Bipolar disorder   . PTSD (post-traumatic stress disorder)   . Neuropathy     in legs and feet and hands   Past Surgical History  Procedure Date  . Ankle surgery   . Cesarean section    Social History:  reports that she quit smoking about 5 months ago.  She has never used smokeless tobacco. She reports that she does not drink alcohol or use illicit drugs.  Allergies  Allergen Reactions  . Penicillins Rash    Family History  Problem Relation Age of Onset  . Diabetes Mother     currently 34  . Fibromyalgia Mother   . Cirrhosis Father     died in 08-Nov-1997    Prior to Admission medications   Medication Sig Start Date End Date Taking? Authorizing Provider  aspirin EC 81 MG tablet Take 81 mg by mouth daily.    Yes Historical Provider, MD  gabapentin (NEURONTIN) 300 MG capsule Take 300 mg by mouth 2 (two) times daily.   Yes Historical  Provider, MD  insulin aspart (NOVOLOG) 100 UNIT/ML injection Inject 6 Units into the skin 3 (three) times daily with meals. 09/23/11 09/22/12 Yes Elease Etienne, MD  insulin glargine (LANTUS) 100 UNIT/ML injection Inject 50 Units into the skin at bedtime. 09/23/11  Yes Elease Etienne, MD  lithium carbonate (LITHOBID) 300 MG CR tablet Take 600 mg by mouth at bedtime. 08/18/11  Yes Osvaldo Shipper, MD  QUEtiapine (SEROQUEL) 50 MG tablet Take 50 mg by mouth at bedtime. 08/18/11  Yes Osvaldo Shipper, MD   Physical Exam: Filed Vitals:   03/15/12 1232 03/15/12 1521  BP: 132/82 122/75  Pulse: 88 68  Temp: 97.8 F (36.6 C)   TempSrc: Oral   Resp: 20 14  SpO2: 99% 100%     General: WDWN in no acute cardiopulmonary disease.  Eyes: PERRLA, eomi,  ENT: Oropharynx clear, no lesions, no exudates  Neck: Supple, No LAD  Cardiovascular: RRR, no m/r/g  Respiratory: CTAB  Abdomen: Soft/NT/ND/+BS  Skin: No rashes or lesions  Musculoskeletal: 5/5 bue STRENGTH, 5/5 ble STRENGTH  Psychiatric: Unable to access secondary to patient screaming in pain  Neurologic: Unable to assess as patient screaming in pain. Patient is moving all extremities spontaneously.  Labs on Admission:  Basic Metabolic Panel:  Lab 03/15/12 1610  NA 141  K 4.2  CL 100  CO2 19  GLUCOSE 375*  BUN 14  CREATININE 0.44*  CALCIUM 10.6*  MG --  PHOS --   Liver Function Tests:  Lab 03/15/12 1510  AST 40*  ALT 37*  ALKPHOS 107  BILITOT 0.5  PROT 8.5*  ALBUMIN 4.4    Lab 03/15/12 1510  LIPASE 20  AMYLASE --   No results found for this basename: AMMONIA:5 in the last 168 hours CBC:  Lab 03/15/12 1510  WBC 9.2  NEUTROABS --  HGB 12.9  HCT 39.8  MCV 70.3*  PLT 156   Cardiac Enzymes: No results found for this basename: CKTOTAL:5,CKMB:5,CKMBINDEX:5,TROPONINI:5 in the last 168 hours  BNP (last 3 results) No results found for this basename: PROBNP:3 in the last 8760 hours CBG:  Lab 03/15/12 1653 03/15/12  1241 03/15/12 1238  GLUCAP 347* 362* 375*    Radiological Exams on Admission: Dg Abd Acute W/chest  03/15/2012  *RADIOLOGY REPORT*  Clinical Data: Abdominal pain.  ACUTE ABDOMEN SERIES (ABDOMEN 2 VIEW & CHEST 1 VIEW)  Comparison: Chest and two views abdomen 09/30/2011 and CT abdomen and pelvis 09/12/2011.  Findings: Single view of the chest demonstrates clear lungs and normal heart size.  No pneumothorax or pleural fluid. S shaped thoracolumbar scoliosis is again seen.  Two views of the abdomen show no free intraperitoneal air.  The bowel gas pattern is unremarkable.  IMPRESSION: No acute finding.  Scoliosis.   Original Report Authenticated By:  THOMAS L. D'ALESSIO, M.D.     EKG: Not done.  Assessment/Plan Principal Problem:  *DKA, type 1 Active Problems:  Bipolar 2 disorder  Seizure disorder  Microcytic anemia  Dehydration  Non compliance w medication regimen  Tobacco abuse  CKD (chronic kidney disease) stage 3, GFR 30-59 ml/min  DM (diabetes mellitus), type 1, uncontrolled  Abdominal pain   #1 early DKA Questionable etiology. Likely secondary to medication noncompliance. Patient does have an anion gap of 22 the glucose level of 375 ketones in the urine and small acetone in the serum. Patient appears dehydrated. Urinalysis which was done was negative for urinary tract infection. Patient is complaining of pain all over. No abdominal films have been done. Will admit the patient to the step down unit. Place on IV fluids. Place on the glucose stabilizer. Will check an acute abdominal series. Will check a EKG. We'll cycle cardiac enzymes every 6 hours x3. Will keep n.p.o. Antiemetics. And supportive care. Patient may need to be placed on 7030 insulin of Lantus due to her financial constraints. We'll consult with diabetic coordinator in the morning.  #2 dehydration IV fluids  #3 nausea and vomiting Questionable etiology. Patient may have a component of gastroparesis. We'll get an acute  abdominal series. IV fluids. Anti-emetics. Supportive care. We'll keep n.p.o. for now. If no improvement may need a gastric emptying study versus CT of the abdomen and pelvis.  #4 poorly controlled diabetes mellitus Will check a hemoglobin A1c. See problem #1. We'll consult with diabetic coordinator. We'll also consult with social worker and case manager for the input.  #5 chronic kidney disease Stable.  #6 bipolar disorder Continue home dose lithium and Seroquel.  #7 seizure disorder Stable.  #8 tobacco abuse We'll place on a nicotine patch.  #9 prophylaxis Protonix for GI prophylaxis. Lovenox for DVT prophylaxis.  Code Status: Full Family Communication: Updated patient at bedside. No Family present. Disposition Plan: Admit to SDU  Time spent: 60 mins  [facility] Pager [phone]  If 7PM-7AM, please contact night-coverage [email or web address] Password [facility] 03/15/2012, 6:21 PM

## 2012-03-15 NOTE — ED Notes (Signed)
Report called by [name] RN to floor before this shift. L Sagrario Lineberry RN taking patient to in patient room at this time.

## 2012-03-15 NOTE — ED Notes (Signed)
Gave report to RN on 2w, Will not bring pt up until after 1930

## 2012-03-15 NOTE — ED Notes (Signed)
RN to obtain labs with start of IV 

## 2012-03-16 ENCOUNTER — Encounter (HOSPITAL_COMMUNITY): Payer: Self-pay

## 2012-03-16 DIAGNOSIS — A599 Trichomoniasis, unspecified: Secondary | ICD-10-CM | POA: Diagnosis present

## 2012-03-16 DIAGNOSIS — E876 Hypokalemia: Secondary | ICD-10-CM | POA: Diagnosis not present

## 2012-03-16 LAB — URINE MICROSCOPIC-ADD ON

## 2012-03-16 LAB — URINALYSIS, ROUTINE W REFLEX MICROSCOPIC
Glucose, UA: 500 mg/dL — AB
Hgb urine dipstick: NEGATIVE
Specific Gravity, Urine: 1.027 (ref 1.005–1.030)

## 2012-03-16 LAB — CBC
HCT: 33.6 % — ABNORMAL LOW (ref 36.0–46.0)
MCH: 22.8 pg — ABNORMAL LOW (ref 26.0–34.0)
MCHC: 32.7 g/dL (ref 30.0–36.0)
RDW: 15.9 % — ABNORMAL HIGH (ref 11.5–15.5)

## 2012-03-16 LAB — GLUCOSE, CAPILLARY
Glucose-Capillary: 125 mg/dL — ABNORMAL HIGH (ref 70–99)
Glucose-Capillary: 138 mg/dL — ABNORMAL HIGH (ref 70–99)
Glucose-Capillary: 168 mg/dL — ABNORMAL HIGH (ref 70–99)
Glucose-Capillary: 230 mg/dL — ABNORMAL HIGH (ref 70–99)
Glucose-Capillary: 207 mg/dL — ABNORMAL HIGH (ref 70–99)
Glucose-Capillary: 176 mg/dL — ABNORMAL HIGH (ref 70–99)
Glucose-Capillary: 98 mg/dL (ref 70–99)
Glucose-Capillary: 203 mg/dL — ABNORMAL HIGH (ref 70–99)
Glucose-Capillary: 97 mg/dL (ref 70–99)

## 2012-03-16 LAB — BASIC METABOLIC PANEL
BUN: 13 mg/dL (ref 6–23)
BUN: 12 mg/dL (ref 6–23)
Calcium: 8.9 mg/dL (ref 8.4–10.5)
Chloride: 106 mEq/L (ref 96–112)
Chloride: 107 mEq/L (ref 96–112)
Creatinine, Ser: 0.41 mg/dL — ABNORMAL LOW (ref 0.50–1.10)
Creatinine, Ser: 0.48 mg/dL — ABNORMAL LOW (ref 0.50–1.10)
GFR calc Af Amer: >90 mL/min (ref >90)
GFR calc Af Amer: >90 mL/min (ref >90)
GFR calc non Af Amer: >90 mL/min (ref >90)
GFR calc non Af Amer: >90 mL/min (ref >90)
Glucose, Bld: 131 mg/dL — ABNORMAL HIGH (ref 70–99)
Potassium: 3.1 mEq/L — ABNORMAL LOW (ref 3.5–5.1)
Potassium: 3.8 mEq/L (ref 3.5–5.1)
Sodium: 135 mEq/L (ref 135–145)
Sodium: 139 mEq/L (ref 135–145)

## 2012-03-16 LAB — HEMOGLOBIN A1C
Hgb A1c MFr Bld: 11 % — ABNORMAL HIGH (ref <5.7)
Mean Plasma Glucose: 269 mg/dL — ABNORMAL HIGH (ref <117)

## 2012-03-16 LAB — CK TOTAL AND CKMB (NOT AT ARMC)
CK, MB: 1.4 ng/mL (ref 0.3–4.0)
Total CK: 46 U/L (ref 7–177)
Total CK: 50 U/L (ref 7–177)

## 2012-03-16 LAB — TROPONIN I
Troponin I: <0.3 ng/mL (ref <0.30)
Troponin I: <0.3 ng/mL (ref <0.30)

## 2012-03-16 MED ORDER — POTASSIUM CHLORIDE CRYS ER 20 MEQ PO TBCR
40.0000 meq | EXTENDED_RELEASE_TABLET | ORAL | Status: AC
  Administered 2012-03-16 (×2): 40 meq via ORAL
  Filled 2012-03-16: qty 1
  Filled 2012-03-16: qty 2

## 2012-03-16 MED ORDER — METRONIDAZOLE 500 MG PO TABS
2000.0000 mg | ORAL_TABLET | Freq: Once | ORAL | Status: AC
  Administered 2012-03-16: 2000 mg via ORAL
  Filled 2012-03-16: qty 4

## 2012-03-16 MED ORDER — DEXTROSE 50 % IV SOLN
25.0000 mL | Freq: Once | INTRAVENOUS | Status: AC | PRN
  Filled 2012-03-16: qty 50

## 2012-03-16 MED ORDER — INSULIN ASPART PROT & ASPART (70-30 MIX) 100 UNIT/ML ~~LOC~~ SUSP
25.0000 [IU] | Freq: Two times a day (BID) | SUBCUTANEOUS | Status: DC
  Administered 2012-03-16 – 2012-03-17 (×4): 25 [IU] via SUBCUTANEOUS
  Filled 2012-03-16: qty 3

## 2012-03-16 MED ORDER — PANTOPRAZOLE SODIUM 40 MG PO TBEC
40.0000 mg | DELAYED_RELEASE_TABLET | Freq: Every day | ORAL | Status: DC
  Administered 2012-03-16 – 2012-03-17 (×2): 40 mg via ORAL
  Filled 2012-03-16 (×2): qty 1

## 2012-03-16 NOTE — Progress Notes (Signed)
This patient is receiving IV Protonix. Based on criteria approved by the Pharmacy and Therapeutics Committee, this medication is being converted to the equivalent oral dose form. These criteria include: . The patient is eating (either orally or per tube) and/or has been taking other orally administered medications for at least 24 hours. . This patient has no evidence of active gastrointestinal bleeding or impaired GI absorption (gastrectomy, short bowel, patient on TNA or NPO).  If you have questions about this conversion, please contact the pharmacy department. Thank you.  Clance Boll, PharmD, BCPS Pager: [phone] 03/16/2012 10:58 AM

## 2012-03-16 NOTE — Progress Notes (Signed)
  LATE ENTRY FOR [id number]/ CARE MANAGEMENT NOTE 03/17/2012  Patient:  Mary [name], Mary [name]   Account Number:  [id number]  Date Initiated:  03/16/2012  Documentation initiated by:  DAVIS,RHONDA  Subjective/Objective Assessment:   spt with hx of dka and noncompliance, admitted to sdu on glucostablizer.     Action/Plan:   lives at home has PCP who is working with her on obtaining meds, history of noncopmpliance.   Anticipated DC Date:  03/19/2012   Anticipated DC Plan:  HOME/SELF CARE  In-house referral  NA      DC Planning Services  NA      [facility] Choice  NA   Choice offered to / List presented to:  NA   DME arranged  NA      DME agency  NA     HH arranged  NA      HH agency  NA   Status of service:  In process, will continue to follow Medicare Important Message given?  NA - LOS <3 / Initial given by admissions (If response is "NO", the following Medicare IM given date fields will be blank) Date Medicare IM given:   Date Additional Medicare IM given:    Discharge Disposition:    Per UR Regulation:  Reviewed for med. necessity/level of care/duration of stay  If discussed at Long Length of Stay Meetings, dates discussed:    Comments:  [id number]/Rhonda Earlene Plater, RN, BSN, CCM: CHART REVIEWED AND UPDATED. Information,discount coupon and form to be completed and faxed to [facility] for free insulin program placed on chart. Final dosage and frequency of the novalog insulin will need to added before form can be faxed to program.. CASE MANAGEMENT [phone]

## 2012-03-16 NOTE — Progress Notes (Signed)
[facility] PROGRESS NOTE  Mary [name] WUX:[id number] DOB: 1971-01-24 DOA: 03/15/2012 PCP: Mary Lodge, FNP  Assessment/Plan: Principal Problem:  *DKA, type 1 Active Problems:  Bipolar 2 disorder  Seizure disorder  Microcytic anemia  Dehydration  Non compliance w medication regimen  Tobacco abuse  CKD (chronic kidney disease) stage 3, GFR 30-59 ml/min  DM (diabetes mellitus), type 1, uncontrolled  Abdominal pain  Hypokalemia  Trichomonas  #1 early diabetic ketoacidosis Questionable etiology. Likely secondary to medication noncompliance secondary to financial constraints. Patient states hasn't had a Lantus in several months is unable to afford. Acute abdominal series was negative. Urinalysis in the ED was negative. Trichomonas was noted in the urine. Cardiac enzymes are negative x3. EKG with no acute ST-T elevation. Clinical improvement. Patient does however have a leukocytosis. Will repeat a UA with cultures and sensitivities and follow. Patient's anion gap was 22 on admission and is now 11. Patient is currently still on the glucose stabilizer we will overlap with basal insulin 70/30 25 units twice a day. Continue IV fluids. We'll place him a copy modified diet. Consult with the diabetic coordinator.  #2 poorly controlled diabetes Patient's diabetes is poorly controlled and has had multiple admissions for DKA felt to be secondary to financial constraints and inability to afford her Lantus. Patient's hemoglobin A1c is 11.0. Will place patient on 70/30 at 25 units twice daily. Also the diabetic coordinator. Case manager consult to help patient with her medications.  #3 hypokalemia Secondary to problem #1. Replete. We'll check a magnesium level.  #4 Trichomonas in the urine We'll give a one-time dose of Flagyl 2 g by mouth x1.  #5 abdominal pain Likely secondary to problem #1. Acute abdominal series is negative. Lipase levels was within normal limits. Cardiac enzymes are  negative x3. Clinical improvement. Patient denies any further abdominal pain. Supportive and symptomatic care. If patient develops further abdominal pain and nausea and vomiting may consider a gastric emptying study.  #6 chronic kidney disease Stable. Renal function is within normal limits today.  #7 bipolar disorder Patient's lithium and Seroquel have been resumed.  #8 history of seizure disorder Stable.  #9 tobacco abuse Nicotine patch.  #10 prophylaxis PPI for GI prophylaxis. Lovenox for DVT prophylaxis. His  Code Status: Full Family Communication: Updated per patient. No family at bedside. Disposition Plan: Home when medically stable   Brief narrative: Mary [name] is a 41 y.o. female with a history of medication noncompliance, type 1 diabetes with multiple admissions for DKA secondary to medication noncompliance, history of seizure disorder, history of bipolar disorder, history of cirrhosis, anxiety, PTSD who presents to the ED with a several hour history of pain all over, nausea or vomiting. Patient states he woke up at 2 AM on the morning of admission feeling sick nauseous with vomiting and abdominal and back pain and pain everywhere. Patient endorses some chills. Patient denies any fever, patient does endorse some chest tightness which she refuses to elaborate on her she states she is in pain all over and screaming for pain medications. Patient also endorses some weakness. Patient denies any shortness of breath, no diarrhea, no constipation. Patient states his usual symptoms prior to developing DKA. Patient states has not been on any of her Lantus to NovoLog in several months due to the fact that she can't afford it and that PCP is working with her to be able to get the insulin. Patient was seen in the ED was noted to have a glucose of 375. He  was also noted to have ketones in her urine, acetone in the blood, and anion gap of 22 with a bicarbonate of 19. Patient was placed on a  glucose stabilizer given some IV fluids and will call to admit the patient for further evaluation and management. No x-rays were done at this time   Consultants:  None  Procedures:  Acute abdominal series 03/15/2012  Antibiotics:  Flagyl 2 g by mouth x1 03/16/2012  HPI/Subjective: Patient states he feels better. Patient denies any abdominal pain. Patient denies any chest pain. Patient asking for food.  Objective: Filed Vitals:   03/16/12 0030 03/16/12 0400 03/16/12 0751 03/16/12 0800  BP: 96/59 122/73 107/60   Pulse: 79 81 80   Temp:  98 F (36.7 C)  99.9 F (37.7 C)  TempSrc:  Oral  Oral  Resp: 15 13 16    Height:      Weight:      SpO2: 99% 99% 100%     Intake/Output Summary (Last 24 hours) at 03/16/12 0947 Last data filed at 03/16/12 0740  Gross per 24 hour  Intake 1747.4 ml  Output    450 ml  Net 1297.4 ml   Filed Weights   03/15/12 1940  Weight: 47.2 kg (104 lb 0.9 oz)    Exam:   General:  NAD  Cardiovascular: RRR  Respiratory: CTAB  Abdomen: Soft/NT/ND/+BS  Data Reviewed: Basic Metabolic Panel:  Lab 03/16/12 1610 03/15/12 2350 03/15/12 2050 03/15/12 1510  NA 138 139 142 141  K 3.1* 3.6 3.4* 4.2  CL 106 107 107 100  CO2 21 22 19 19   GLUCOSE 131* 144* 209* 375*  BUN 13 12 13 14   CREATININE 0.48* 0.41* 0.42* 0.44*  CALCIUM 9.2 8.9 9.2 10.6*  MG 1.9 -- -- --  PHOS -- -- -- --   Liver Function Tests:  Lab 03/15/12 1510  AST 40*  ALT 37*  ALKPHOS 107  BILITOT 0.5  PROT 8.5*  ALBUMIN 4.4    Lab 03/15/12 1510  LIPASE 20  AMYLASE --   No results found for this basename: AMMONIA:5 in the last 168 hours CBC:  Lab 03/16/12 0615 03/15/12 2050 03/15/12 1510  WBC 16.0* 10.6* 9.2  NEUTROABS -- -- --  HGB 11.0* 11.0* 12.9  HCT 33.6* 34.3* 39.8  MCV 69.6* 70.1* 70.3*  PLT 153 152 156   Cardiac Enzymes:  Lab 03/16/12 0615 03/15/12 2350 03/15/12 2050  CKTOTAL 46 50 50  CKMB 1.4 1.3 1.4  CKMBINDEX -- -- --  TROPONINI <0.30 <0.30  <0.30   BNP (last 3 results) No results found for this basename: PROBNP:3 in the last 8760 hours CBG:  Lab 03/16/12 0840 03/16/12 0725 03/16/12 0620 03/16/12 0516 03/16/12 0401  GLUCAP 146* 114* 120* 176* 207*    Recent Results (from the past 240 hour(s))  MRSA PCR SCREENING     Status: Normal   Collection Time   03/15/12  7:52 PM      Component Value Range Status Comment   MRSA by PCR NEGATIVE  NEGATIVE Final      Studies: Dg Abd Acute W/chest  03/15/2012  *RADIOLOGY REPORT*  Clinical Data: Abdominal pain.  ACUTE ABDOMEN SERIES (ABDOMEN 2 VIEW & CHEST 1 VIEW)  Comparison: Chest and two views abdomen 09/30/2011 and CT abdomen and pelvis 09/12/2011.  Findings: Single view of the chest demonstrates clear lungs and normal heart size.  No pneumothorax or pleural fluid. S shaped thoracolumbar scoliosis is again seen.  Two views of  the abdomen show no free intraperitoneal air.  The bowel gas pattern is unremarkable.  IMPRESSION: No acute finding.  Scoliosis.   Original Report Authenticated By: Bernadene Bell. D'ALESSIO, M.D.     Scheduled Meds:    . sodium chloride   Intravenous STAT  . aspirin EC  81 mg Oral Daily  . enoxaparin  40 mg Subcutaneous Q24H  . gabapentin  300 mg Oral BID  .  HYDROmorphone (DILAUDID) injection  0.5 mg Intravenous Once  . insulin aspart  0-9 Units Subcutaneous TID WC  . insulin aspart protamine-insulin aspart  25 Units Subcutaneous BID WC  . lithium carbonate  600 mg Oral QHS  . metroNIDAZOLE  2,000 mg Oral Once  . nicotine  7 mg Transdermal Daily  . ondansetron (ZOFRAN) IV  4 mg Intravenous Once  . pantoprazole (PROTONIX) IV  40 mg Intravenous Once  . pantoprazole (PROTONIX) IV  40 mg Intravenous Q24H  . potassium chloride  10 mEq Intravenous Q1H  . potassium chloride  40 mEq Oral Q4H  . QUEtiapine  50 mg Oral QHS  . sodium chloride  1,000 mL Intravenous Once  . sodium chloride  1,000 mL Intravenous Once  . DISCONTD: insulin regular  0-10 Units Intravenous  TID WC   Continuous Infusions:    . sodium chloride    . sodium chloride    . dextrose 5 % and 0.45% NaCl 1,000 mL (03/16/12 0426)  . DISCONTD: sodium chloride    . DISCONTD: dextrose 5 % and 0.45% NaCl    . DISCONTD: insulin (NOVOLIN-R) infusion Stopped (03/16/12 0740)  . DISCONTD: insulin (NOVOLIN-R) infusion      Principal Problem:  *DKA, type 1 Active Problems:  Bipolar 2 disorder  Seizure disorder  Microcytic anemia  Dehydration  Non compliance w medication regimen  Tobacco abuse  CKD (chronic kidney disease) stage 3, GFR 30-59 ml/min  DM (diabetes mellitus), type 1, uncontrolled  Abdominal pain  Hypokalemia  Trichomonas    Time spent: > 35 mins    [facility]  [facility] Pager [phone]. If 8PM-8AM, please contact night-coverage at [email or web address], password [facility] 03/16/2012, 9:47 AM  LOS: 1 day

## 2012-03-16 NOTE — Progress Notes (Signed)
Inpatient Diabetes Program Recommendations  AACE/ADA: New Consensus Statement on Inpatient Glycemic Control (2013)  Target Ranges:  Prepandial:   less than 140 mg/dL      Peak postprandial:   less than 180 mg/dL (1-2 hours)      Critically ill patients:  140 - 180 mg/dL   Reason for Visit: Consult noted on Patient Summary screen, but not received.  Note: Patient receptive to my visit.  Patient admitted with DKA.  Has a history of multiple DKA admissions.  Has a history of problems obtaining insulin.  Was just taking Novolog insulin alone, which she had received last admission.  Had run out of Lantus insulin, but states that her PCP at [facility] was trying to get free insulin for her.    Has no home glucose meter.  Wants a free meter.  States that she can tell when her blood sugar is up or down, but she doesn't know the numbers because she cannot check her blood sugar.  States she is supposed to take Novolog based on a sliding scale-- but has been taking 6 units with meals since she didn't know what her blood sugar was.  Cautioned her about getting a free name-brand meter because the test strips would cost around 50 to 75 cents each as opposed to a generic meter from [employer].  The Reli-On Prime meter costs $16.24 and the test strips cost $9 for 50 which would be 18 cents each.  After much discussion, patient understands this would be a better option for her.  Currently on 70/30 insulin.  Gave her information regarding ReliOn Novolin 70/30 insulin from [employer] that costs $24.88 a vial.  Since it contains both basal and bolus insulins, would ideally not need any other insulin.  States that she will check with her fiancee and possibly some others to see if she can get help to obtain her insulin.  Verbalized the understanding that taking insulin is vital to her life.  Will follow-up tomorrow. Sahmir Weatherbee S. Elsie Lincoln, RN, CNS, CDE  [phone])

## 2012-03-17 LAB — BASIC METABOLIC PANEL
BUN: 9 mg/dL (ref 6–23)
Chloride: 104 mEq/L (ref 96–112)
GFR calc Af Amer: >90 mL/min (ref >90)
GFR calc non Af Amer: >90 mL/min (ref >90)
Potassium: 3.6 mEq/L (ref 3.5–5.1)
Sodium: 133 mEq/L — ABNORMAL LOW (ref 135–145)

## 2012-03-17 LAB — CBC WITH DIFFERENTIAL/PLATELET
Basophils Relative: 0 % (ref 0–1)
Eosinophils Absolute: 0.2 10*3/uL (ref 0.0–0.7)
Eosinophils Relative: 2 % (ref 0–5)
HCT: 31.9 % — ABNORMAL LOW (ref 36.0–46.0)
Hemoglobin: 10.3 g/dL — ABNORMAL LOW (ref 12.0–15.0)
Lymphocytes Relative: 48 % — ABNORMAL HIGH (ref 12–46)
Monocytes Relative: 5 % (ref 3–12)
Neutro Abs: 5.3 10*3/uL (ref 1.7–7.7)
Neutrophils Relative %: 45 % (ref 43–77)
RBC: 4.55 MIL/uL (ref 3.87–5.11)
WBC: 11.7 10*3/uL — ABNORMAL HIGH (ref 4.0–10.5)

## 2012-03-17 LAB — GLUCOSE, CAPILLARY
Glucose-Capillary: 190 mg/dL — ABNORMAL HIGH (ref 70–99)
Glucose-Capillary: 162 mg/dL — ABNORMAL HIGH (ref 70–99)
Glucose-Capillary: 224 mg/dL — ABNORMAL HIGH (ref 70–99)

## 2012-03-17 MED ORDER — LITHIUM CARBONATE ER 300 MG PO TBCR: 600.0000 mg | EXTENDED_RELEASE_TABLET | Freq: Every day | ORAL | Status: DC

## 2012-03-17 MED ORDER — GABAPENTIN 300 MG PO CAPS: 300.0000 mg | ORAL_CAPSULE | Freq: Two times a day (BID) | ORAL | Status: DC

## 2012-03-17 MED ORDER — INSULIN ASPART PROT & ASPART (70-30 MIX) 100 UNIT/ML ~~LOC~~ SUSP: 25.0000 [IU] | Freq: Two times a day (BID) | SUBCUTANEOUS | Status: DC

## 2012-03-17 MED ORDER — BD GETTING STARTED TAKE HOME KIT: 3/10ML X 30G SYRINGES
1.0000 | Freq: Once | Status: DC
  Filled 2012-03-17: qty 1

## 2012-03-17 MED ORDER — OXYCODONE HCL 5 MG PO TABS: 5.0000 mg | ORAL_TABLET | ORAL | Status: DC | PRN

## 2012-03-17 MED ORDER — ONDANSETRON HCL 4 MG PO TABS: 4.0000 mg | ORAL_TABLET | Freq: Three times a day (TID) | ORAL | Status: DC | PRN

## 2012-03-17 MED ORDER — QUETIAPINE FUMARATE 50 MG PO TABS: 50.0000 mg | ORAL_TABLET | Freq: Every day | ORAL | Status: DC

## 2012-03-17 MED ORDER — BD GETTING STARTED TAKE HOME KIT: 1/2ML X 30G SYRINGES
1.0000 | Freq: Once | Status: AC
  Administered 2012-03-17: 1
  Filled 2012-03-17: qty 1

## 2012-03-17 MED ORDER — POTASSIUM CHLORIDE CRYS ER 20 MEQ PO TBCR
40.0000 meq | EXTENDED_RELEASE_TABLET | Freq: Once | ORAL | Status: AC
  Administered 2012-03-17: 40 meq via ORAL
  Filled 2012-03-17: qty 2

## 2012-03-17 NOTE — Discharge Summary (Signed)
Physician Discharge Summary  Mary [name]:[id number] DOB: Apr 16, 1971 DOA: 03/15/2012  PCP: Dartha Lodge, FNP  Admit date: 03/15/2012 Discharge date: 03/17/2012  Recommendations for Outpatient Follow-up:  1. Patient is to followup with PCP one week post discharge. Patient's diabetes will need to be reassessed at that time. Patient's insulin has been changed to Novolin 70/30 twice daily. Patient also need a basic metabolic profile done to followup on electrolytes and renal function.  Discharge Diagnoses:  Principal Problem:  *DKA, type 1 Active Problems:  Bipolar 2 disorder  Seizure disorder  Microcytic anemia  Dehydration  Non compliance w medication regimen  Tobacco abuse  CKD (chronic kidney disease) stage 3, GFR 30-59 ml/min  DM (diabetes mellitus), type 1, uncontrolled  Abdominal pain  Hypokalemia  Trichomonas   Discharge Condition: Stable and improved.  Diet recommendation: CARB MODIFIED DIET  Filed Weights   03/15/12 1940 03/16/12 2018  Weight: 47.2 kg (104 lb 0.9 oz) 51.5 kg (113 lb 8.6 oz)    History of present illness:  Mary [name] is a 41 y.o. female with a history of medication noncompliance, type 1 diabetes with multiple admissions for DKA secondary to medication noncompliance, history of seizure disorder, history of bipolar disorder, history of cirrhosis, anxiety, PTSD who presents to the ED with a several hour history of pain all over, nausea or vomiting. Patient states he woke up at 2 AM on the morning of admission feeling sick nauseous with vomiting and abdominal and back pain and pain everywhere. Patient endorses some chills. Patient denies any fever, patient does endorse some chest tightness which she refuses to elaborate on her she states she is in pain all over and screaming for pain medications. Patient also endorses some weakness. Patient denies any shortness of breath, no diarrhea, no constipation. Patient states his usual symptoms prior to developing  DKA. Patient states has not been on any of her Lantus to NovoLog in several months due to the fact that she can't afford it and that PCP is working with her to be able to get the insulin. Patient was seen in the ED was noted to have a glucose of 375. He was also noted to have ketones in her urine, acetone in the blood, and anion gap of 22 with a bicarbonate of 19. Patient was placed on a glucose stabilizer given some IV fluids and will call to admit the patient for further evaluation and management. No x-rays were done at this time      Hospital Course:  #1 early diabetic ketoacidosis Patient was admitted with early diabetic ketoacidosis which was felt to be secondary to medication noncompliance secondary to financial constraints. Patient on presentation had and had a Lantus in several months as she stated she couldn't afford it and her PCP was working on upper negative. An acute abdominal series was done which was negative. Urinalysis which was done was negative however to come on as was noted in the urine and patient was given a one-time dose of Flagyl 2 g by mouth x1. Cardiac enzymes were cycled which were negative x3. EKG which was done was unremarkable. Patient was placed on IV fluids based on the glucose stabilizer and initially admitted to the step down unit. On admission patient was noted to have ketones in the urine and in his and had an anion gap of 22. Patient was monitored patient improved clinically. Patient's anion gap closed and patient was subsequently transitioned to basal insulin of NovoLog 70/30 at 25 units twice daily.  Patient was subsequently started on a carb modified diet which he tolerated. Patient was subsequently transitioned to the floor. Patient improved clinically and she'll be discharged in stable and improved condition on NovoLog 70 3025 units twice daily. Patient is to followup with PCP one week post discharge.  #2 poorly controlled diabetes Patient was noted to have a  history of poorly controlled diabetes secondary to medical noncompliance secondary to financial constraints. Hemoglobin A1c which was checked him back elevated at 11. Patient was on Lantus at home however has been off of Lantus for several months due to the fact that she is unable to afford it. Patient was transitioned to NovoLog 70 3025 units twice daily which will be discharged home. Patient is to followup with PCP as outpatient.  #3 hypokalemia During the hospitalization patient was noted to be hypokalemic with correction of her diabetic ketoacidosis. Patient's potassium was repleted and her hypokalemia had resolved by day of discharge.  #4 Trichomonas in the urine Do not hesitation and urinalysis which was obtained did show trichomonas and patient's urine. Patient was given a one-time dose of Flagyl 2 g orally x1. Patient followup with PCP as outpatient.  #5 abdominal pain Patient on presentation was complaining of diffuse abdominal pain as well as back pain. Patient's abdominal pain and back pain have been chronic in nature. Patient has had multiple imaging of the abdomen including CT scans which are unremarkable. Acute abdominal series which was obtained during this hospitalization was unremarkable. It was felt patient's abdominal pain was likely secondary to problem #1. Patient improved with treatment of her diabetic ketoacidosis and be discharged home in stable condition. All followup if patient continues to have abdominal pain PCP may consider a gastric emptying study to rule out gastroparesis.  The rest of patient's chronic medical issues were stable throughout the hospitalization the patient was discharged in stable and improved condition.  Procedures:  Acute abdominal series 03/15/2012  Consultations:  None  Discharge Exam: Filed Vitals:   03/16/12 1900 03/16/12 2018 03/17/12 0650 03/17/12 1520  BP:  105/68 113/77 114/70  Pulse: 72 76 69 69  Temp:  99 F (37.2 C) 98.4 F (36.9  C)   TempSrc:  Oral Oral   Resp: 15 18 20 18   Height:  5\' 4"  (1.626 m)    Weight:  51.5 kg (113 lb 8.6 oz)    SpO2: 100% 100% 100% 100%    General: NAD Cardiovascular: RRR Respiratory: CTAB  Discharge Instructions  Discharge Orders    Future Orders Please Complete By Expires   Diet Carb Modified      Increase activity slowly      Discharge instructions      Comments:   Follow up with STEELE,ANTHONY, FNP in 1 week.       Medication List     As of 03/17/2012  4:30 PM    STOP taking these medications         insulin aspart 100 UNIT/ML injection   Commonly known as: novoLOG      insulin glargine 100 UNIT/ML injection   Commonly known as: LANTUS      TAKE these medications         aspirin EC 81 MG tablet   Take 81 mg by mouth daily.      gabapentin 300 MG capsule   Commonly known as: NEURONTIN   Take 1 capsule (300 mg total) by mouth 2 (two) times daily.      insulin aspart protamine-insulin aspart (  70-30) 100 UNIT/ML injection   Commonly known as: NOVOLOG 70/30   Inject 25 Units into the skin 2 (two) times daily with a meal.      lithium carbonate 300 MG CR tablet   Commonly known as: LITHOBID   Take 2 tablets (600 mg total) by mouth at bedtime.      ondansetron 4 MG tablet   Commonly known as: ZOFRAN   Take 1 tablet (4 mg total) by mouth every 8 (eight) hours as needed for nausea.      oxyCODONE 5 MG immediate release tablet   Commonly known as: Oxy IR/ROXICODONE   Take 1 tablet (5 mg total) by mouth every 4 (four) hours as needed.      QUEtiapine 50 MG tablet   Commonly known as: SEROQUEL   Take 1 tablet (50 mg total) by mouth at bedtime.           Follow-up Information    Follow up with STEELE,ANTHONY, FNP. Schedule an appointment as soon as possible for a visit in 1 week.          The results of significant diagnostics from this hospitalization (including imaging, microbiology, ancillary and laboratory) are listed below for reference.     Significant Diagnostic Studies: Dg Abd Acute W/chest  03/15/2012  *RADIOLOGY REPORT*  Clinical Data: Abdominal pain.  ACUTE ABDOMEN SERIES (ABDOMEN 2 VIEW & CHEST 1 VIEW)  Comparison: Chest and two views abdomen 09/30/2011 and CT abdomen and pelvis 09/12/2011.  Findings: Single view of the chest demonstrates clear lungs and normal heart size.  No pneumothorax or pleural fluid. S shaped thoracolumbar scoliosis is again seen.  Two views of the abdomen show no free intraperitoneal air.  The bowel gas pattern is unremarkable.  IMPRESSION: No acute finding.  Scoliosis.   Original Report Authenticated By: Bernadene Bell. Maricela Curet, M.D.     Microbiology: Recent Results (from the past 240 hour(s))  MRSA PCR SCREENING     Status: Normal   Collection Time   03/15/12  7:52 PM      Component Value Range Status Comment   MRSA by PCR NEGATIVE  NEGATIVE Final      Labs: Basic Metabolic Panel:  Lab 03/17/12 1914 03/16/12 1447 03/16/12 0630 03/15/12 2350 03/15/12 2050  NA 133* 135 138 139 142  K 3.6 3.8 3.1* 3.6 3.4*  CL 104 107 106 107 107  CO2 21 20 21 22 19   GLUCOSE 226* 103* 131* 144* 209*  BUN 9 12 13 12 13   CREATININE 0.45* 0.56 0.48* 0.41* 0.42*  CALCIUM 8.7 9.1 9.2 8.9 9.2  MG -- -- 1.9 -- --  PHOS -- -- -- -- --   Liver Function Tests:  Lab 03/15/12 1510  AST 40*  ALT 37*  ALKPHOS 107  BILITOT 0.5  PROT 8.5*  ALBUMIN 4.4    Lab 03/15/12 1510  LIPASE 20  AMYLASE --   No results found for this basename: AMMONIA:5 in the last 168 hours CBC:  Lab 03/17/12 0451 03/16/12 0615 03/15/12 2050 03/15/12 1510  WBC 11.7* 16.0* 10.6* 9.2  NEUTROABS 5.3 -- -- --  HGB 10.3* 11.0* 11.0* 12.9  HCT 31.9* 33.6* 34.3* 39.8  MCV 70.1* 69.6* 70.1* 70.3*  PLT 174 153 152 156   Cardiac Enzymes:  Lab 03/16/12 0615 03/15/12 2350 03/15/12 2050  CKTOTAL 46 50 50  CKMB 1.4 1.3 1.4  CKMBINDEX -- -- --  TROPONINI <0.30 <0.30 <0.30   BNP: BNP (last 3 results) No  results found for this basename:  PROBNP:3 in the last 8760 hours CBG:  Lab 03/17/12 1204 03/17/12 0742 03/17/12 0123 03/16/12 2125 03/16/12 1638  GLUCAP 162* 240* 190* 97 203*    Time coordinating discharge: 60 minutes  Signed:  THOMPSON,DANIEL  [facility] 03/17/2012, 4:30 PM

## 2012-03-17 NOTE — Progress Notes (Signed)
Talked to patient about DCP/ Medication needs; Patient was going to [facility] for medical care but the Clinic closed; Patient is going to [facility] the [place] [phone]) and is seen by Dartha Lodge NP; per Selena Batten, the clinic is currently trying to help the patient get her meds through a medication assistance program but unable to complete the process because they are waiting on the patient to bring in more paper work - they will mail off the form and it will take 4-6 weeks before it gets approved. Patient does not qualify for the indigent funds.  All prescriptions are filled at [facility] pharmacy; Patient stated that she will be staying with her boyfriend after discharge.

## 2012-03-17 NOTE — Progress Notes (Signed)
Inpatient Diabetes Program Recommendations  AACE/ADA: New Consensus Statement on Inpatient Glycemic Control (2013)  Target Ranges:  Prepandial:   less than 140 mg/dL      Peak postprandial:   less than 180 mg/dL (1-2 hours)      Critically ill patients:  140 - 180 mg/dL   Reason for Visit: Follow-up from yesterday's visit   Note: Used teach-back method to assess patient's understanding regarding Novolin/ReliOn 70/30 from [place] and the ReliOn Prime meter from [place].  Ordered an insulin starter kit from pharmacy so patient will have some syringes to take home with her.

## 2012-03-17 NOTE — Progress Notes (Signed)
Thank you to Dr. Ramiro Harvest for this referral.  Chart review complete.  Patient's current insurance is Medicaid pending.  [facility] is not in network to serve medicaid patients at this time.  For any additional questions or new referrals please contact Anibal Henderson BSN RN [facility] Liaison at [phone].

## 2012-03-18 LAB — URINE CULTURE: Colony Count: 40000

## 2012-04-06 ENCOUNTER — Encounter (HOSPITAL_COMMUNITY): Payer: Self-pay | Admitting: Emergency Medicine

## 2012-04-06 ENCOUNTER — Emergency Department (HOSPITAL_COMMUNITY)
Admission: EM | Admit: 2012-04-06 | Discharge: 2012-04-06 | Disposition: A | Discharge status: Home / Self Care | Payer: Medicaid Other | Attending: Emergency Medicine | Admitting: Emergency Medicine

## 2012-04-06 DIAGNOSIS — Z8669 Personal history of other diseases of the nervous system and sense organs: Secondary | ICD-10-CM | POA: Insufficient documentation

## 2012-04-06 DIAGNOSIS — Z79899 Other long term (current) drug therapy: Secondary | ICD-10-CM | POA: Diagnosis not present

## 2012-04-06 DIAGNOSIS — F1910 Other psychoactive substance abuse, uncomplicated: Secondary | ICD-10-CM | POA: Insufficient documentation

## 2012-04-06 DIAGNOSIS — Z8679 Personal history of other diseases of the circulatory system: Secondary | ICD-10-CM | POA: Diagnosis not present

## 2012-04-06 DIAGNOSIS — R109 Unspecified abdominal pain: Secondary | ICD-10-CM | POA: Insufficient documentation

## 2012-04-06 DIAGNOSIS — Z794 Long term (current) use of insulin: Secondary | ICD-10-CM | POA: Diagnosis not present

## 2012-04-06 DIAGNOSIS — K7460 Unspecified cirrhosis of liver: Secondary | ICD-10-CM | POA: Diagnosis not present

## 2012-04-06 DIAGNOSIS — Z8719 Personal history of other diseases of the digestive system: Secondary | ICD-10-CM | POA: Insufficient documentation

## 2012-04-06 DIAGNOSIS — M19279 Secondary osteoarthritis, unspecified ankle and foot: Secondary | ICD-10-CM | POA: Insufficient documentation

## 2012-04-06 DIAGNOSIS — F17200 Nicotine dependence, unspecified, uncomplicated: Secondary | ICD-10-CM | POA: Diagnosis not present

## 2012-04-06 DIAGNOSIS — Z862 Personal history of diseases of the blood and blood-forming organs and certain disorders involving the immune mechanism: Secondary | ICD-10-CM | POA: Insufficient documentation

## 2012-04-06 DIAGNOSIS — Z7982 Long term (current) use of aspirin: Secondary | ICD-10-CM | POA: Insufficient documentation

## 2012-04-06 DIAGNOSIS — E119 Type 2 diabetes mellitus without complications: Secondary | ICD-10-CM | POA: Insufficient documentation

## 2012-04-06 DIAGNOSIS — G909 Disorder of the autonomic nervous system, unspecified: Secondary | ICD-10-CM | POA: Insufficient documentation

## 2012-04-06 DIAGNOSIS — R112 Nausea with vomiting, unspecified: Secondary | ICD-10-CM | POA: Insufficient documentation

## 2012-04-06 DIAGNOSIS — Z8659 Personal history of other mental and behavioral disorders: Secondary | ICD-10-CM | POA: Diagnosis not present

## 2012-04-06 DIAGNOSIS — I739 Peripheral vascular disease, unspecified: Secondary | ICD-10-CM | POA: Diagnosis not present

## 2012-04-06 DIAGNOSIS — F3189 Other bipolar disorder: Secondary | ICD-10-CM | POA: Insufficient documentation

## 2012-04-06 LAB — URINE MICROSCOPIC-ADD ON

## 2012-04-06 LAB — CBC
MCH: 23 pg — ABNORMAL LOW (ref 26.0–34.0)
MCV: 71.5 fL — ABNORMAL LOW (ref 78.0–100.0)
Platelets: 182 10*3/uL (ref 150–400)
RBC: 5.05 MIL/uL (ref 3.87–5.11)
RDW: 16.2 % — ABNORMAL HIGH (ref 11.5–15.5)

## 2012-04-06 LAB — URINALYSIS, ROUTINE W REFLEX MICROSCOPIC
Glucose, UA: NEGATIVE mg/dL
Nitrite: NEGATIVE
Protein, ur: NEGATIVE mg/dL
Urobilinogen, UA: 1 mg/dL (ref 0.0–1.0)

## 2012-04-06 LAB — COMPREHENSIVE METABOLIC PANEL
AST: 59 U/L — ABNORMAL HIGH (ref 0–37)
CO2: 24 mEq/L (ref 19–32)
Calcium: 9.2 mg/dL (ref 8.4–10.5)
Creatinine, Ser: 0.54 mg/dL (ref 0.50–1.10)
GFR calc non Af Amer: >90 mL/min (ref >90)

## 2012-04-06 LAB — RAPID URINE DRUG SCREEN, HOSP PERFORMED
Amphetamines: NOT DETECTED
Barbiturates: NOT DETECTED
Benzodiazepines: NOT DETECTED
Tetrahydrocannabinol: POSITIVE — AB

## 2012-04-06 LAB — LIPASE, BLOOD: Lipase: 15 U/L (ref 11–59)

## 2012-04-06 LAB — HCG, SERUM, QUALITATIVE: Preg, Serum: NEGATIVE

## 2012-04-06 MED ORDER — SODIUM CHLORIDE 0.9 % IV SOLN
1000.0000 mL | Freq: Once | INTRAVENOUS | Status: AC
  Administered 2012-04-06: 1000 mL via INTRAVENOUS

## 2012-04-06 MED ORDER — SODIUM CHLORIDE 0.9 % IV SOLN: 1000.0000 mL | INTRAVENOUS | Status: DC

## 2012-04-06 MED ORDER — PROMETHAZINE HCL 25 MG PO TABS: 25.0000 mg | ORAL_TABLET | Freq: Four times a day (QID) | ORAL | Status: DC | PRN

## 2012-04-06 MED ORDER — HYDROMORPHONE HCL PF 1 MG/ML IJ SOLN
1.0000 mg | Freq: Once | INTRAMUSCULAR | Status: AC
  Administered 2012-04-06: 1 mg via INTRAVENOUS
  Filled 2012-04-06: qty 1

## 2012-04-06 MED ORDER — ONDANSETRON HCL 4 MG/2ML IJ SOLN
4.0000 mg | Freq: Once | INTRAMUSCULAR | Status: AC
  Administered 2012-04-06: 4 mg via INTRAVENOUS
  Filled 2012-04-06: qty 2

## 2012-04-06 NOTE — ED Notes (Signed)
Per EMS, pt has hx of pancreatitis, DM, Hep C and DKA. Pt is c/o N/V x 2d, & ABD pain.

## 2012-04-06 NOTE — ED Notes (Signed)
Bed:WA10<BR> Expected date:<BR> Expected time:<BR> Means of arrival:<BR> Comments:<BR> EMS

## 2012-04-06 NOTE — ED Notes (Signed)
Notified RN, [place] CBG 167.

## 2012-04-06 NOTE — ED Provider Notes (Signed)
History     CSN: [id number]  Arrival date & time 04/06/12  0413   First MD Initiated Contact with Patient 04/06/12 [phone]      Chief Complaint  Patient presents with  . Abdominal Pain     The history is provided by the patient.   the patient is a long-standing history of abdominal pain.  She reports nausea vomiting and upper abdominal pain over the last 48 hours.  She denies diarrhea.  She's had no fevers or chills.  Her vomiting is been nonbloody and nonbilious.  She denies melena or hematochezia.  She locates her abdominal pain to her upper abdomen.  Her pain is mild to moderate in severity.  She is a diabetic and is poorly controlled.  She is intermittently noncompliant with her medications.  She reports she's been compliant recently with her medications which is concerned that she may have DKA as this is sometimes she feels when she developed DKA.. she denies dysuria she has no urinary frequency.  She denies vaginal complaints.  Past Medical History  Diagnosis Date  . Diabetes mellitus     diagnosed in 1996-always been on insulin  . Kidney stone   . Seizure disorder     started with pregnancy of first son  . Gastritis   . Bipolar 2 disorder   . Post traumatic stress disorder     "flipping out" after people close to her died  . DKA (diabetic ketoacidoses)     Recurrent admissions for DKA, medication non-complaince, poor social situation  . Cirrhosis   . Angina   . Heart murmur   . Shortness of breath     occassionally  . Seizures     pt states 76yrs ago  . Headache     occassionally  . Arthritis     r ankle-S/P surgery Nov 27, 2005)  . Anxiety   . Peripheral vascular disease     numbness bilaterally feet  . Anemia     childhood  . H/O hiatal hernia     2 yrs ago  . Depression     childhood  . Hep C w/o coma, chronic     biopsy in 2010-no rx-was supposed to see a hepatologist in [place]  . Bipolar disorder   . PTSD (post-traumatic stress disorder)   . Neuropathy       in legs and feet and hands    Past Surgical History  Procedure Date  . Ankle surgery   . Cesarean section     Family History  Problem Relation Age of Onset  . Diabetes Mother     currently 49  . Fibromyalgia Mother   . Cirrhosis Father     died in 11-27-97    History  Substance Use Topics  . Smoking status: Current Every Day Smoker -- 0.1 packs/day for 25 years    Types: Cigarettes  . Smokeless tobacco: Never Used  . Alcohol Use: No     Endorses hasn't been drinking    OB History    Grav Para Term Preterm Abortions TAB SAB Ect Mult Living                  Review of Systems  Gastrointestinal: Positive for abdominal pain.  All other systems reviewed and are negative.    Allergies  Penicillins  Home Medications   Current Outpatient Rx  Name Route Sig Dispense Refill  . ASPIRIN EC 81 MG PO TBEC Oral Take 81 mg by mouth daily.     Marland Kitchen  GABAPENTIN 300 MG PO CAPS Oral Take 1 capsule (300 mg total) by mouth 2 (two) times daily. 62 capsule 0  . INSULIN ASPART PROT & ASPART (70-30) 100 UNIT/ML [place] SUSP Subcutaneous Inject 25 Units into the skin 2 (two) times daily with a meal. 20 mL 0  . LITHIUM CARBONATE ER 300 MG PO TBCR Oral Take 2 tablets (600 mg total) by mouth at bedtime. 62 tablet 0  . ONDANSETRON HCL 4 MG PO TABS Oral Take 1 tablet (4 mg total) by mouth every 8 (eight) hours as needed for nausea. 20 tablet 0  . OXYCODONE HCL 5 MG PO TABS Oral Take 1 tablet (5 mg total) by mouth every 4 (four) hours as needed. 20 tablet 0  . QUETIAPINE FUMARATE 50 MG PO TABS Oral Take 1 tablet (50 mg total) by mouth at bedtime. 31 tablet 0  . PROMETHAZINE HCL 25 MG PO TABS Oral Take 1 tablet (25 mg total) by mouth every 6 (six) hours as needed for nausea. 12 tablet 0    BP 118/66  Pulse 84  Resp 17  SpO2 100%  LMP 03/23/2012  Physical Exam  Nursing note and vitals reviewed. Constitutional: She is oriented to person, place, and time. She appears well-developed and  well-nourished. No distress.  HENT:  Head: Normocephalic and atraumatic.  Eyes: EOM are normal.  Neck: Normal range of motion.  Cardiovascular: Normal rate, regular rhythm and normal heart sounds.   Pulmonary/Chest: Effort normal and breath sounds normal.  Abdominal: Soft. She exhibits no distension. There is no tenderness.  Musculoskeletal: Normal range of motion.  Neurological: She is alert and oriented to person, place, and time.  Skin: Skin is warm and dry.  Psychiatric: She has a normal mood and affect. Judgment normal.    ED Course  Procedures (including critical care time)  Labs Reviewed  GLUCOSE, CAPILLARY - Abnormal; Notable for the following:    Glucose-Capillary 167 (*)     All other components within normal limits  CBC - Abnormal; Notable for the following:    Hemoglobin 11.6 (*)     MCV 71.5 (*)     MCH 23.0 (*)     RDW 16.2 (*)     All other components within normal limits  COMPREHENSIVE METABOLIC PANEL - Abnormal; Notable for the following:    Potassium 3.3 (*)     Glucose, Bld 190 (*)     Albumin 3.4 (*)     AST 59 (*)     ALT 50 (*)     All other components within normal limits  URINALYSIS, ROUTINE W REFLEX MICROSCOPIC - Abnormal; Notable for the following:    Color, Urine BIOCHEMICALS MAY BE AFFECTED BY COLOR (*)  YELLOW   APPearance TURBID (*)     Hgb urine dipstick MODERATE (*)     Bilirubin Urine SMALL (*)     Ketones, ur 15 (*)     Leukocytes, UA TRACE (*)     All other components within normal limits  URINE RAPID DRUG SCREEN (HOSP PERFORMED) - Abnormal; Notable for the following:    Cocaine POSITIVE (*)     Tetrahydrocannabinol POSITIVE (*)     All other components within normal limits  URINE MICROSCOPIC-ADD ON - Abnormal; Notable for the following:    Squamous Epithelial / LPF MANY (*)     Bacteria, UA MANY (*)     All other components within normal limits  LIPASE, BLOOD  HCG, SERUM, QUALITATIVE  HEMOGLOBIN A1C  No results found.   1.  Abdominal pain   2. Substance abuse       MDM  6:49 AM The patient feels much better at this time.  She was discharged home in good condition.  Instructed the patient to stop her substance abuse.  The patient is to follow up closely with her doctor.  Discharge home with Phenergan.  Repeat abdominal exam benign.        Lyanne Co, MD 04/06/12 [phone]

## 2012-04-21 ENCOUNTER — Emergency Department (HOSPITAL_COMMUNITY)
Admission: EM | Admit: 2012-04-21 | Discharge: 2012-04-21 | Disposition: A | Discharge status: Home / Self Care | Payer: Medicaid Other | Attending: Emergency Medicine | Admitting: Emergency Medicine

## 2012-04-21 ENCOUNTER — Encounter (HOSPITAL_COMMUNITY): Payer: Self-pay | Admitting: *Deleted

## 2012-04-21 DIAGNOSIS — G40909 Epilepsy, unspecified, not intractable, without status epilepticus: Secondary | ICD-10-CM | POA: Insufficient documentation

## 2012-04-21 DIAGNOSIS — F3189 Other bipolar disorder: Secondary | ICD-10-CM | POA: Insufficient documentation

## 2012-04-21 DIAGNOSIS — E1142 Type 2 diabetes mellitus with diabetic polyneuropathy: Secondary | ICD-10-CM | POA: Insufficient documentation

## 2012-04-21 DIAGNOSIS — E1110 Type 2 diabetes mellitus with ketoacidosis without coma: Secondary | ICD-10-CM | POA: Diagnosis not present

## 2012-04-21 DIAGNOSIS — Z79899 Other long term (current) drug therapy: Secondary | ICD-10-CM | POA: Insufficient documentation

## 2012-04-21 DIAGNOSIS — Z862 Personal history of diseases of the blood and blood-forming organs and certain disorders involving the immune mechanism: Secondary | ICD-10-CM | POA: Insufficient documentation

## 2012-04-21 DIAGNOSIS — Z8709 Personal history of other diseases of the respiratory system: Secondary | ICD-10-CM | POA: Insufficient documentation

## 2012-04-21 DIAGNOSIS — Z794 Long term (current) use of insulin: Secondary | ICD-10-CM | POA: Diagnosis not present

## 2012-04-21 DIAGNOSIS — Z8659 Personal history of other mental and behavioral disorders: Secondary | ICD-10-CM | POA: Diagnosis not present

## 2012-04-21 DIAGNOSIS — F4310 Post-traumatic stress disorder, unspecified: Secondary | ICD-10-CM | POA: Insufficient documentation

## 2012-04-21 DIAGNOSIS — Z8719 Personal history of other diseases of the digestive system: Secondary | ICD-10-CM | POA: Diagnosis not present

## 2012-04-21 DIAGNOSIS — E1149 Type 2 diabetes mellitus with other diabetic neurological complication: Secondary | ICD-10-CM | POA: Insufficient documentation

## 2012-04-21 DIAGNOSIS — Z87442 Personal history of urinary calculi: Secondary | ICD-10-CM | POA: Diagnosis not present

## 2012-04-21 DIAGNOSIS — R109 Unspecified abdominal pain: Secondary | ICD-10-CM | POA: Insufficient documentation

## 2012-04-21 DIAGNOSIS — I739 Peripheral vascular disease, unspecified: Secondary | ICD-10-CM | POA: Insufficient documentation

## 2012-04-21 DIAGNOSIS — Z8679 Personal history of other diseases of the circulatory system: Secondary | ICD-10-CM | POA: Insufficient documentation

## 2012-04-21 DIAGNOSIS — Z7982 Long term (current) use of aspirin: Secondary | ICD-10-CM | POA: Insufficient documentation

## 2012-04-21 DIAGNOSIS — R112 Nausea with vomiting, unspecified: Secondary | ICD-10-CM | POA: Diagnosis present

## 2012-04-21 DIAGNOSIS — F17200 Nicotine dependence, unspecified, uncomplicated: Secondary | ICD-10-CM | POA: Insufficient documentation

## 2012-04-21 DIAGNOSIS — G8929 Other chronic pain: Secondary | ICD-10-CM | POA: Diagnosis not present

## 2012-04-21 DIAGNOSIS — Z8739 Personal history of other diseases of the musculoskeletal system and connective tissue: Secondary | ICD-10-CM | POA: Insufficient documentation

## 2012-04-21 DIAGNOSIS — E1169 Type 2 diabetes mellitus with other specified complication: Secondary | ICD-10-CM | POA: Diagnosis not present

## 2012-04-21 DIAGNOSIS — F411 Generalized anxiety disorder: Secondary | ICD-10-CM | POA: Diagnosis not present

## 2012-04-21 DIAGNOSIS — K7460 Unspecified cirrhosis of liver: Secondary | ICD-10-CM | POA: Diagnosis not present

## 2012-04-21 DIAGNOSIS — R739 Hyperglycemia, unspecified: Secondary | ICD-10-CM

## 2012-04-21 LAB — CBC WITH DIFFERENTIAL/PLATELET
Eosinophils Absolute: 0 10*3/uL (ref 0.0–0.7)
Hemoglobin: 11.4 g/dL — ABNORMAL LOW (ref 12.0–15.0)
Lymphocytes Relative: 22 % (ref 12–46)
Lymphs Abs: 2.1 10*3/uL (ref 0.7–4.0)
MCH: 23.4 pg — ABNORMAL LOW (ref 26.0–34.0)
MCV: 71.3 fL — ABNORMAL LOW (ref 78.0–100.0)
Monocytes Relative: 2 % — ABNORMAL LOW (ref 3–12)
Neutrophils Relative %: 76 % (ref 43–77)
Platelets: 187 10*3/uL (ref 150–400)
RBC: 4.88 MIL/uL (ref 3.87–5.11)
WBC: 9.6 10*3/uL (ref 4.0–10.5)

## 2012-04-21 LAB — COMPREHENSIVE METABOLIC PANEL
ALT: 34 U/L (ref 0–35)
Alkaline Phosphatase: 84 U/L (ref 39–117)
BUN: 16 mg/dL (ref 6–23)
CO2: 23 mEq/L (ref 19–32)
GFR calc Af Amer: >90 mL/min (ref >90)
GFR calc non Af Amer: >90 mL/min (ref >90)
Glucose, Bld: 353 mg/dL — ABNORMAL HIGH (ref 70–99)
Potassium: 3.8 mEq/L (ref 3.5–5.1)
Sodium: 134 mEq/L — ABNORMAL LOW (ref 135–145)
Total Bilirubin: 0.2 mg/dL — ABNORMAL LOW (ref 0.3–1.2)
Total Protein: 6.9 g/dL (ref 6.0–8.3)

## 2012-04-21 LAB — URINALYSIS, ROUTINE W REFLEX MICROSCOPIC
Bilirubin Urine: NEGATIVE
Glucose, UA: >1000 mg/dL — AB
Ketones, ur: 40 mg/dL — AB
Nitrite: NEGATIVE
Specific Gravity, Urine: 1.038 — ABNORMAL HIGH (ref 1.005–1.030)
pH: 7 (ref 5.0–8.0)

## 2012-04-21 LAB — URINE MICROSCOPIC-ADD ON

## 2012-04-21 LAB — GLUCOSE, CAPILLARY
Glucose-Capillary: 176 mg/dL — ABNORMAL HIGH (ref 70–99)
Glucose-Capillary: 137 mg/dL — ABNORMAL HIGH (ref 70–99)
Glucose-Capillary: 180 mg/dL — ABNORMAL HIGH (ref 70–99)

## 2012-04-21 LAB — LIPASE, BLOOD: Lipase: 27 U/L (ref 11–59)

## 2012-04-21 MED ORDER — METOCLOPRAMIDE HCL 10 MG PO TABS: 10.0000 mg | ORAL_TABLET | Freq: Four times a day (QID) | ORAL | Status: DC

## 2012-04-21 MED ORDER — ONDANSETRON HCL 4 MG/2ML IJ SOLN
4.0000 mg | Freq: Once | INTRAMUSCULAR | Status: AC
  Administered 2012-04-21: 4 mg via INTRAVENOUS
  Filled 2012-04-21: qty 2

## 2012-04-21 MED ORDER — SODIUM CHLORIDE 0.9 % IV BOLUS (SEPSIS)
1000.0000 mL | Freq: Once | INTRAVENOUS | Status: AC
  Administered 2012-04-21: 1000 mL via INTRAVENOUS

## 2012-04-21 MED ORDER — METOCLOPRAMIDE HCL 5 MG/ML IJ SOLN
10.0000 mg | Freq: Once | INTRAMUSCULAR | Status: AC
  Administered 2012-04-21: 10 mg via INTRAVENOUS
  Filled 2012-04-21: qty 2

## 2012-04-21 MED ORDER — LORAZEPAM 2 MG/ML IJ SOLN
1.0000 mg | Freq: Once | INTRAMUSCULAR | Status: AC
  Administered 2012-04-21: 1 mg via INTRAVENOUS
  Filled 2012-04-21: qty 1

## 2012-04-21 MED ORDER — PANTOPRAZOLE SODIUM 40 MG IV SOLR
40.0000 mg | Freq: Once | INTRAVENOUS | Status: AC
  Administered 2012-04-21: 40 mg via INTRAVENOUS
  Filled 2012-04-21: qty 40

## 2012-04-21 MED ORDER — OXYCODONE HCL 5 MG PO TABS: 5.0000 mg | ORAL_TABLET | ORAL | Status: DC | PRN

## 2012-04-21 MED ORDER — HYDROMORPHONE HCL PF 1 MG/ML IJ SOLN
1.0000 mg | Freq: Once | INTRAMUSCULAR | Status: AC
  Administered 2012-04-21: 1 mg via INTRAVENOUS
  Filled 2012-04-21: qty 1

## 2012-04-21 MED ORDER — PROMETHAZINE HCL 25 MG/ML IJ SOLN
25.0000 mg | Freq: Once | INTRAMUSCULAR | Status: AC
  Administered 2012-04-21: 25 mg via INTRAVENOUS
  Filled 2012-04-21: qty 1

## 2012-04-21 MED ORDER — SODIUM CHLORIDE 0.9 % IV SOLN
1000.0000 mL | Freq: Once | INTRAVENOUS | Status: AC
  Administered 2012-04-21: 1000 mL via INTRAVENOUS

## 2012-04-21 MED ORDER — SODIUM CHLORIDE 0.9 % IV SOLN
INTRAVENOUS | Status: DC
  Administered 2012-04-21: 2.6 [IU]/h via INTRAVENOUS
  Filled 2012-04-21: qty 1

## 2012-04-21 NOTE — ED Notes (Signed)
MD at bedside. 

## 2012-04-21 NOTE — ED Notes (Addendum)
Per EMS, pt from home with reports of nausea, emesis and abdominal pain that radiates to back. Per EMS, pt endorses hx of pancreatitis. EMS reports giving 60mg  IM Toradol and 4mg  IV Zofran prior to arrival with reported relief until pt arrived to ED.

## 2012-04-21 NOTE — ED Provider Notes (Signed)
History     CSN: [id number]  Arrival date & time 04/21/12  0906   First MD Initiated Contact with Patient 04/21/12 0913      Chief Complaint  Patient presents with  . Nausea  . Emesis  . Abdominal Pain    radiates to back    (Consider location/radiation/quality/duration/timing/severity/associated sxs/prior treatment) HPI Comments: Patient presents today with a chief complaint of nausea, vomiting, and abdominal pain.  She reports that her symptoms began earlier this morning a couple of hours prior to arrival.  She reports that she had five episodes of vomiting.  She denies diarrhea.  No blood in her emesis or blood in her stool.  She is also have generalized abdominal pain.  Denies fevers.  Denies alcohol use.  She was given Toradol IM and Zofran IV by EMS prior to arrival in the ED.  Patient with a history of DM and numerous admissions for DKA.  She reports that she last took her Insulin last evening.  Patient is a 41 y.o. female presenting with abdominal pain. The history is provided by the patient.  Abdominal Pain The primary symptoms of the illness include abdominal pain, nausea and vomiting. The primary symptoms of the illness do not include fever, shortness of breath, diarrhea, hematemesis, hematochezia, dysuria, vaginal discharge or vaginal bleeding. The onset of the illness was gradual. The problem has been gradually worsening.  Associated with: Denies alcohol use. The patient states that she believes she is currently not pregnant. The patient has not had a change in bowel habit. Symptoms associated with the illness do not include constipation, urgency, hematuria or frequency.    Past Medical History  Diagnosis Date  . Diabetes mellitus     diagnosed in 1996-always been on insulin  . Kidney stone   . Seizure disorder     started with pregnancy of first son  . Gastritis   . Bipolar 2 disorder   . Post traumatic stress disorder     "flipping out" after people close to her  died  . DKA (diabetic ketoacidoses)     Recurrent admissions for DKA, medication non-complaince, poor social situation  . Cirrhosis   . Angina   . Heart murmur   . Shortness of breath     occassionally  . Seizures     pt states 40yrs ago  . Headache     occassionally  . Arthritis     r ankle-S/P surgery November 24, 2005)  . Anxiety   . Peripheral vascular disease     numbness bilaterally feet  . Anemia     childhood  . H/O hiatal hernia     2 yrs ago  . Depression     childhood  . Hep C w/o coma, chronic     biopsy in 2010-no rx-was supposed to see a hepatologist in [place]  . Bipolar disorder   . PTSD (post-traumatic stress disorder)   . Neuropathy     in legs and feet and hands    Past Surgical History  Procedure Date  . Ankle surgery   . Cesarean section     Family History  Problem Relation Age of Onset  . Diabetes Mother     currently 30  . Fibromyalgia Mother   . Cirrhosis Father     died in Nov 24, 1997    History  Substance Use Topics  . Smoking status: Current Every Day Smoker -- 0.1 packs/day for 25 years    Types: Cigarettes  . Smokeless tobacco: Never  Used  . Alcohol Use: No     Comment: Endorses hasn't been drinking    OB History    Grav Para Term Preterm Abortions TAB SAB Ect Mult Living                  Review of Systems  Constitutional: Negative for fever.  Respiratory: Negative for shortness of breath.   Cardiovascular: Negative for chest pain.  Gastrointestinal: Positive for nausea, vomiting and abdominal pain. Negative for diarrhea, constipation, blood in stool, hematochezia, abdominal distention and hematemesis.  Genitourinary: Negative for dysuria, urgency, frequency, hematuria, flank pain, vaginal bleeding and vaginal discharge.  Neurological: Negative for dizziness, seizures, syncope and light-headedness.    Allergies  Penicillins  Home Medications   Current Outpatient Rx  Name  Route  Sig  Dispense  Refill  . ASPIRIN EC 81 MG PO  TBEC   Oral   Take 81 mg by mouth daily.          Marland Kitchen GABAPENTIN 300 MG PO CAPS   Oral   Take 1 capsule (300 mg total) by mouth 2 (two) times daily.   62 capsule   0   . INSULIN ASPART PROT & ASPART (70-30) 100 UNIT/ML [place] SUSP   Subcutaneous   Inject 25 Units into the skin 2 (two) times daily with a meal.   20 mL   0   . LITHIUM CARBONATE ER 300 MG PO TBCR   Oral   Take 2 tablets (600 mg total) by mouth at bedtime.   62 tablet   0   . ONDANSETRON HCL 4 MG PO TABS   Oral   Take 1 tablet (4 mg total) by mouth every 8 (eight) hours as needed for nausea.   20 tablet   0   . OXYCODONE HCL 5 MG PO TABS   Oral   Take 1 tablet (5 mg total) by mouth every 4 (four) hours as needed.   20 tablet   0   . PROMETHAZINE HCL 25 MG PO TABS   Oral   Take 1 tablet (25 mg total) by mouth every 6 (six) hours as needed for nausea.   12 tablet   0   . QUETIAPINE FUMARATE 50 MG PO TABS   Oral   Take 1 tablet (50 mg total) by mouth at bedtime.   31 tablet   0     BP 149/89  Pulse 60  Temp 98.3 F (36.8 C) (Oral)  Resp 20  SpO2 99%  LMP 04/17/2012  Physical Exam  Nursing note and vitals reviewed. Constitutional: She appears well-developed and well-nourished.  HENT:  Head: Normocephalic and atraumatic.  Mouth/Throat: Oropharynx is clear and moist.  Cardiovascular: Normal rate, regular rhythm and normal heart sounds.   Pulmonary/Chest: Effort normal and breath sounds normal.  Abdominal: Soft. Normal appearance and bowel sounds are normal. She exhibits no distension and no mass. There is generalized tenderness. There is no rebound and no guarding.  Neurological: She is alert.  Skin: Skin is warm and dry. She is not diaphoretic.  Psychiatric: She has a normal mood and affect.    ED Course  Procedures (including critical care time)   Labs Reviewed  URINALYSIS, ROUTINE W REFLEX MICROSCOPIC  PREGNANCY, URINE  CBC WITH DIFFERENTIAL  COMPREHENSIVE METABOLIC PANEL  LIPASE,  BLOOD   No results found.   No diagnosis found.  10:44 AM Reassessed patient.  She is resting comfortably at this time.  11:30 AM Patient continues  to vomit.  RN witnessed patient vomiting yellowish colored liquid.  1:28 PM Reassessed patient.  She reports that her pain and nausea has improved.  Will fluid challenge and reassess.   Patient discussed with Dr. Denton Lank who also evaluated the patient.   3:29 PM Reassessed patient.  She has been able to tolerate po liquids.  She drank an 8 oz can of Ginger Ale without vomiting.  Patient states that she feels "stable" at this time.  Ambulated patient around the room without difficulty.  MDM  Patient presents today with a chief complaint of nausea, vomiting, or abdominal pain.  Patient with numerous admissions for DKA and insulin noncompliance.  Patient also has been seen in the ED several times for vomiting and abdominal pain.  Patient afebrile.  Labs today unremarkable.  Patient does have hemoglobin in her urine, but she is currently having her menstrual period.  Patient with mildly elevated anion gap of 13, but normal CO2.  Glucose elevated at 353 initially, but did respond to IVF and insulin.  Abdominal pain and nausea controlled prior to discharge.  Patient able to tolerate po liquids.          Pascal Lux [place], PA-C 04/21/12 1743  Pascal Lux [place], PA-C 04/21/12 1743

## 2012-04-21 NOTE — Progress Notes (Signed)
Confirmed pt self pay [place] resident Pt previously [facility] patient but states she has a new pcp that is [employer] [facility] the [place] [phone]) and is seen by Dartha Lodge NP Pt was working with Doristine Bosworth at [facility].  [facility] community coordinator spoke with pt who was drowsy.  Pt confirmed pcp [facility] CM has been attempting to reach pt per [facility] files without success. The pt's last orange card for [facility] expired on 11/14/2011 Her orange card prior to 04/07/2011 was with [facility] family practice. [facility] ED CM left voice message for Angelique Blonder requesting update on pt status with them or if pt continues to need assistance to complete forms as noted in 03/15/12 unit cm note [facility] available to assist if forms needed Pending return call

## 2012-04-21 NOTE — ED Notes (Signed)
Bed:WA18<BR> Expected date:<BR> Expected time:<BR> Means of arrival:<BR> Comments:<BR> ems

## 2012-04-21 NOTE — ED Notes (Signed)
PA at bedside.

## 2012-04-21 NOTE — ED Notes (Signed)
Insulin drip and Glucose Stabilizer stopped per PA, will monitor.

## 2012-04-21 NOTE — ED Notes (Signed)
Pt vomited small amount after attempting to drink ginger ale, Heather, PA informed, will monitor.

## 2012-04-22 ENCOUNTER — Emergency Department (HOSPITAL_COMMUNITY): Payer: Medicaid Other

## 2012-04-22 ENCOUNTER — Emergency Department (HOSPITAL_COMMUNITY)
Admission: EM | Admit: 2012-04-22 | Discharge: 2012-04-22 | Disposition: A | Discharge status: Home / Self Care | Payer: Medicaid Other | Attending: Emergency Medicine | Admitting: Emergency Medicine

## 2012-04-22 ENCOUNTER — Encounter (HOSPITAL_COMMUNITY): Payer: Self-pay

## 2012-04-22 DIAGNOSIS — E1010 Type 1 diabetes mellitus with ketoacidosis without coma: Secondary | ICD-10-CM | POA: Diagnosis not present

## 2012-04-22 DIAGNOSIS — Z79899 Other long term (current) drug therapy: Secondary | ICD-10-CM | POA: Insufficient documentation

## 2012-04-22 DIAGNOSIS — B182 Chronic viral hepatitis C: Secondary | ICD-10-CM | POA: Diagnosis not present

## 2012-04-22 DIAGNOSIS — Y939 Activity, unspecified: Secondary | ICD-10-CM | POA: Diagnosis not present

## 2012-04-22 DIAGNOSIS — F17200 Nicotine dependence, unspecified, uncomplicated: Secondary | ICD-10-CM | POA: Diagnosis not present

## 2012-04-22 DIAGNOSIS — S3690XA Unspecified injury of unspecified intra-abdominal organ, initial encounter: Secondary | ICD-10-CM | POA: Diagnosis present

## 2012-04-22 DIAGNOSIS — W010XXA Fall on same level from slipping, tripping and stumbling without subsequent striking against object, initial encounter: Secondary | ICD-10-CM | POA: Insufficient documentation

## 2012-04-22 DIAGNOSIS — Z794 Long term (current) use of insulin: Secondary | ICD-10-CM | POA: Insufficient documentation

## 2012-04-22 DIAGNOSIS — M129 Arthropathy, unspecified: Secondary | ICD-10-CM | POA: Insufficient documentation

## 2012-04-22 DIAGNOSIS — Y92009 Unspecified place in unspecified non-institutional (private) residence as the place of occurrence of the external cause: Secondary | ICD-10-CM | POA: Insufficient documentation

## 2012-04-22 DIAGNOSIS — Z7982 Long term (current) use of aspirin: Secondary | ICD-10-CM | POA: Insufficient documentation

## 2012-04-22 DIAGNOSIS — R51 Headache: Secondary | ICD-10-CM | POA: Insufficient documentation

## 2012-04-22 DIAGNOSIS — K7460 Unspecified cirrhosis of liver: Secondary | ICD-10-CM | POA: Insufficient documentation

## 2012-04-22 DIAGNOSIS — G40909 Epilepsy, unspecified, not intractable, without status epilepticus: Secondary | ICD-10-CM | POA: Insufficient documentation

## 2012-04-22 DIAGNOSIS — D649 Anemia, unspecified: Secondary | ICD-10-CM | POA: Insufficient documentation

## 2012-04-22 DIAGNOSIS — I739 Peripheral vascular disease, unspecified: Secondary | ICD-10-CM | POA: Insufficient documentation

## 2012-04-22 DIAGNOSIS — N200 Calculus of kidney: Secondary | ICD-10-CM | POA: Insufficient documentation

## 2012-04-22 DIAGNOSIS — W19XXXA Unspecified fall, initial encounter: Secondary | ICD-10-CM

## 2012-04-22 DIAGNOSIS — F411 Generalized anxiety disorder: Secondary | ICD-10-CM | POA: Insufficient documentation

## 2012-04-22 DIAGNOSIS — G8929 Other chronic pain: Secondary | ICD-10-CM

## 2012-04-22 LAB — CBC WITH DIFFERENTIAL/PLATELET
Basophils Absolute: 0 10*3/uL (ref 0.0–0.1)
Basophils Relative: 0 % (ref 0–1)
Eosinophils Absolute: 0 10*3/uL (ref 0.0–0.7)
Hemoglobin: 11.1 g/dL — ABNORMAL LOW (ref 12.0–15.0)
MCH: 23.1 pg — ABNORMAL LOW (ref 26.0–34.0)
MCHC: 32.4 g/dL (ref 30.0–36.0)
Monocytes Absolute: 0.5 10*3/uL (ref 0.1–1.0)
Monocytes Relative: 4 % (ref 3–12)
Neutro Abs: 7.2 10*3/uL (ref 1.7–7.7)
Neutrophils Relative %: 59 % (ref 43–77)
RDW: 15.1 % (ref 11.5–15.5)

## 2012-04-22 LAB — BASIC METABOLIC PANEL
BUN: 16 mg/dL (ref 6–23)
Chloride: 101 mEq/L (ref 96–112)
Creatinine, Ser: 0.47 mg/dL — ABNORMAL LOW (ref 0.50–1.10)
GFR calc Af Amer: >90 mL/min (ref >90)
GFR calc non Af Amer: >90 mL/min (ref >90)
Potassium: 3.5 mEq/L (ref 3.5–5.1)

## 2012-04-22 LAB — GLUCOSE, CAPILLARY: Glucose-Capillary: 180 mg/dL — ABNORMAL HIGH (ref 70–99)

## 2012-04-22 MED ORDER — SODIUM CHLORIDE 0.9 % IV BOLUS (SEPSIS)
1000.0000 mL | Freq: Once | INTRAVENOUS | Status: AC
  Administered 2012-04-22: 1000 mL via INTRAVENOUS

## 2012-04-22 MED ORDER — LORAZEPAM 2 MG/ML IJ SOLN
1.0000 mg | Freq: Once | INTRAMUSCULAR | Status: AC
  Administered 2012-04-22: 1 mg via INTRAVENOUS
  Filled 2012-04-22: qty 1

## 2012-04-22 MED ORDER — PROMETHAZINE HCL 25 MG PO TABS: 25.0000 mg | ORAL_TABLET | Freq: Four times a day (QID) | ORAL | Status: DC | PRN

## 2012-04-22 MED ORDER — ONDANSETRON HCL 4 MG/2ML IJ SOLN
4.0000 mg | Freq: Once | INTRAMUSCULAR | Status: AC
  Administered 2012-04-22: 4 mg via INTRAVENOUS
  Filled 2012-04-22: qty 2

## 2012-04-22 MED ORDER — HYDROMORPHONE HCL PF 1 MG/ML IJ SOLN
1.0000 mg | Freq: Once | INTRAMUSCULAR | Status: AC
  Administered 2012-04-22: 1 mg via INTRAVENOUS
  Filled 2012-04-22: qty 1

## 2012-04-22 NOTE — Progress Notes (Signed)
Pt seen in [facility] ED on 04/21/12 Cm spoke with pt who states she continues to have concerns with her stomach She reports coming down stairs to open door for EMS and fell down stairs CM updated pt about return call from [facility] [place] staff and their request for pt to make and attend another appointment for assistance Pt voiced understanding

## 2012-04-22 NOTE — ED Notes (Signed)
Pt removed from long spine board. Pt c/o pain in back upon palpation. Pt continues to state, "I hurt everywhere.". C-collar remains in place. Pt instructed to keep c-collar on until cleared by MD.

## 2012-04-22 NOTE — Progress Notes (Signed)
[facility] ED CM received a return voice message from [place] at [facility] to report pt has completed all forms, pt made an appointment but then cancelled the appointment and has not rescheduled an appointment to be seen again CM updated the [facility] community Liaison

## 2012-04-22 NOTE — ED Notes (Signed)
WUJ:[id number]<JY> Expected date:<BR> Expected time:<BR> Means of arrival:<BR> Comments:<BR> abd pain, fall, lsb

## 2012-04-22 NOTE — ED Notes (Signed)
Pt BIB EMS. Pt states she fell down trying to answer her door. Pt c/o back pain from falling and abd pain. Pt ambulatory on scene. Pt with no obvious deformities. Pt arrives with c-collar and long spine board immobilization.

## 2012-04-22 NOTE — ED Notes (Signed)
Pt resting supine in gurney. C-collar remains in place.

## 2012-04-22 NOTE — ED Notes (Signed)
Pt nauseous and spitting up. C-collar remains in place. Pt now crying and moaning in bed. PA notified.

## 2012-04-22 NOTE — ED Provider Notes (Signed)
History     CSN: [id number]  Arrival date & time 04/22/12  1659   First MD Initiated Contact with Patient 04/22/12 1830      Chief Complaint  Patient presents with  . Fall    (Consider location/radiation/quality/duration/timing/severity/associated sxs/prior treatment) HPI Comments: Patient comes in today due to a fall and abdominal pain.  She states that she called EMS due to her abdominal pain.  When EMS arrived at the home she tripped over something on the floor and fell.  She landed on her back.  She did not hit her head.  No LOC.  She was ambulatory at the scene.  She denies any pain of her extremities.  She reports that her abdominal pain is similar to pain that she has had in the past.  Pain is generalized and has been present for months.  She was seen in the ED yesterday for this same abdominal pain and had a negative work up done at that time.   She also reports that she has also been vomiting.  She never got the prescription filled for Reglan yesterday.  Patient just spitting during my evaluation.  She denies any blood in her emesis.  No fever or chills.  No diarrhea.  No constipation.  No blood in her stool.  She does have a history of DM and is currently taking insulin.    The history is provided by the patient.    Past Medical History  Diagnosis Date  . Diabetes mellitus     diagnosed in 1996-always been on insulin  . Kidney stone   . Seizure disorder     started with pregnancy of first son  . Gastritis   . Bipolar 2 disorder   . Post traumatic stress disorder     "flipping out" after people close to her died  . DKA (diabetic ketoacidoses)     Recurrent admissions for DKA, medication non-complaince, poor social situation  . Cirrhosis   . Angina   . Heart murmur   . Shortness of breath     occassionally  . Seizures     pt states 13yrs ago  . Headache     occassionally  . Arthritis     r ankle-S/P surgery 12/07/05)  . Anxiety   . Peripheral vascular disease    numbness bilaterally feet  . Anemia     childhood  . H/O hiatal hernia     2 yrs ago  . Depression     childhood  . Hep C w/o coma, chronic     biopsy in 2010-no rx-was supposed to see a hepatologist in [place]  . Bipolar disorder   . PTSD (post-traumatic stress disorder)   . Neuropathy     in legs and feet and hands    Past Surgical History  Procedure Date  . Ankle surgery   . Cesarean section     Family History  Problem Relation Age of Onset  . Diabetes Mother     currently 40  . Fibromyalgia Mother   . Cirrhosis Father     died in 12/07/97    History  Substance Use Topics  . Smoking status: Current Every Day Smoker -- 0.1 packs/day for 25 years    Types: Cigarettes  . Smokeless tobacco: Never Used  . Alcohol Use: No     Comment: Endorses hasn't been drinking    OB History    Grav Para Term Preterm Abortions TAB SAB Ect Mult Living  Review of Systems  Constitutional: Negative for fever and chills.  Gastrointestinal: Positive for nausea, vomiting and abdominal pain. Negative for diarrhea and constipation.  Genitourinary: Negative for dysuria, urgency and frequency.  Musculoskeletal: Positive for back pain. Negative for gait problem.  Neurological: Negative for dizziness, syncope and light-headedness.  Psychiatric/Behavioral: Negative for confusion.    Allergies  Penicillins  Home Medications   Current Outpatient Rx  Name  Route  Sig  Dispense  Refill  . INSULIN ASPART PROT & ASPART (70-30) 100 UNIT/ML [place] SUSP   Subcutaneous   Inject 25 Units into the skin 2 (two) times daily with a meal.   20 mL   0   . ASPIRIN EC 81 MG PO TBEC   Oral   Take 81 mg by mouth daily.          Marland Kitchen GABAPENTIN 300 MG PO CAPS   Oral   Take 1 capsule (300 mg total) by mouth 2 (two) times daily.   62 capsule   0   . LITHIUM CARBONATE ER 300 MG PO TBCR   Oral   Take 2 tablets (600 mg total) by mouth at bedtime.   62 tablet   0   .  METOCLOPRAMIDE HCL 10 MG PO TABS   Oral   Take 1 tablet (10 mg total) by mouth every 6 (six) hours.   30 tablet   0   . ONDANSETRON HCL 4 MG PO TABS   Oral   Take 1 tablet (4 mg total) by mouth every 8 (eight) hours as needed for nausea.   20 tablet   0   . OXYCODONE HCL 5 MG PO TABS   Oral   Take 1 tablet (5 mg total) by mouth every 4 (four) hours as needed.   20 tablet   0   . OXYCODONE HCL 5 MG PO TABS   Oral   Take 1 tablet (5 mg total) by mouth every 4 (four) hours as needed for pain.   12 tablet   0   . PROMETHAZINE HCL 25 MG PO TABS   Oral   Take 1 tablet (25 mg total) by mouth every 6 (six) hours as needed for nausea.   12 tablet   0   . QUETIAPINE FUMARATE 50 MG PO TABS   Oral   Take 1 tablet (50 mg total) by mouth at bedtime.   31 tablet   0     BP 125/78  Pulse 65  Temp 98.7 F (37.1 C) (Oral)  Resp 16  SpO2 100%  LMP 04/17/2012  Physical Exam  Nursing note and vitals reviewed. Constitutional: She appears well-developed and well-nourished. No distress.  HENT:  Head: Normocephalic and atraumatic.  Mouth/Throat: Oropharynx is clear and moist.  Cardiovascular: Normal rate, regular rhythm and normal heart sounds.   Pulmonary/Chest: Effort normal and breath sounds normal.  Abdominal: Soft. Bowel sounds are normal. She exhibits no distension and no mass. There is tenderness. There is no rebound and no guarding.       Diffuse generalized abdominal pain  Musculoskeletal:       Cervical back: She exhibits tenderness and bony tenderness. She exhibits no swelling, no edema and no deformity.       Thoracic back: She exhibits tenderness and bony tenderness. She exhibits no swelling, no edema and no deformity.       Lumbar back: She exhibits tenderness and bony tenderness. She exhibits no swelling, no edema and no deformity.  Neurological:  She is alert. Gait normal.  Skin: Skin is warm and dry. She is not diaphoretic.  Psychiatric: She has a normal mood and  affect.    ED Course  Procedures (including critical care time)  Labs Reviewed - No data to display Dg Cervical Spine Complete  04/22/2012  *RADIOLOGY REPORT*  Clinical Data: Fall, neck pain  CERVICAL SPINE - COMPLETE 4+ VIEW  Comparison: None.  Findings: Six views of cervical spine submitted.  No acute fracture or subluxation.  Alignment, disc spaces and vertebral height are preserved.  No prevertebral soft tissue swelling.  C1-C2 relationship is unremarkable.  IMPRESSION: No acute fracture or subluxation.   Original Report Authenticated By: Natasha Mead, M.D.    Dg Thoracic Spine 2 View  04/22/2012  *RADIOLOGY REPORT*  Clinical Data: Fall, mid back pain  THORACIC SPINE - 2 VIEW  Comparison: None.  Findings: Three views of thoracic spine submitted.  No acute fracture or subluxation.  Mild lower thoracic dextroscoliosis.  IMPRESSION: No acute fracture or subluxation.  Mild lower thoracic dextroscoliosis.   Original Report Authenticated By: Natasha Mead, M.D.    Dg Lumbar Spine Complete  04/22/2012  *RADIOLOGY REPORT*  Clinical Data: Fall, back pain  LUMBAR SPINE - COMPLETE 4+ VIEW  Comparison: 03/15/2012  Findings: Five views of the lumbar spine submitted.  There is levoscoliosis of the lumbar spine.  No acute fracture or subluxation.  IMPRESSION: No acute fracture or subluxation.  Again noted levoscoliosis of the lumbar spine.   Original Report Authenticated By: Natasha Mead, M.D.      No diagnosis found.  8:37 PM Reassessed patient.  She is now resting comfortably.    9:08 PM Patient with no vomiting while in the ED.  Patient's symptoms improving.  Patient able to ambulate without difficulty.  Tolerating PO liquids.  MDM  Patient presents today with chronic abdominal pain and back pain after a mechanical fall.  Labs unremarkable. No evidence of DKA.  Patient able to tolerate po fluids prior to discharge.  Xrays of her back and neck are negative.  Patient able to ambulate without difficulty.   Patient instructed to follow up with PCP for this chronic abdominal pain.  She was seen by Case Management and set up with an appointment with a Primary Care Physician.        Pascal Lux [place], PA-C 04/23/12 1154

## 2012-04-23 NOTE — ED Provider Notes (Signed)
Medical screening examination/treatment/procedure(s) were conducted as a shared visit with non-physician practitioner(s) and myself.  I personally evaluated the patient during the encounter Pt with hx intermittent/recurrent nv and upper abd pain. abd soft nt. Emesis clear, not bilious.iv fluids, zofran, protonix, pain rx.   Suzi Roots, MD 04/23/12 1504

## 2012-04-23 NOTE — ED Provider Notes (Signed)
Medical screening examination/treatment/procedure(s) were performed by non-physician practitioner and as supervising physician I was immediately available for consultation/collaboration.  Jones Skene, M.D.     Jones Skene, MD 04/23/12 1542

## 2012-04-28 IMAGING — CR DG ABDOMEN ACUTE W/ 1V CHEST
5 series · 5 of 5 positions shown · non-contrast
Comparison: CT abdomen and pelvis with contrast 09/12/2011

CLINICAL DATA: Abdominal pain and chest pain.  Nausea and vomiting.

ACUTE ABDOMEN SERIES (ABDOMEN 2 VIEW & CHEST 1 VIEW)

[w abdomen decub (1 of 2)]
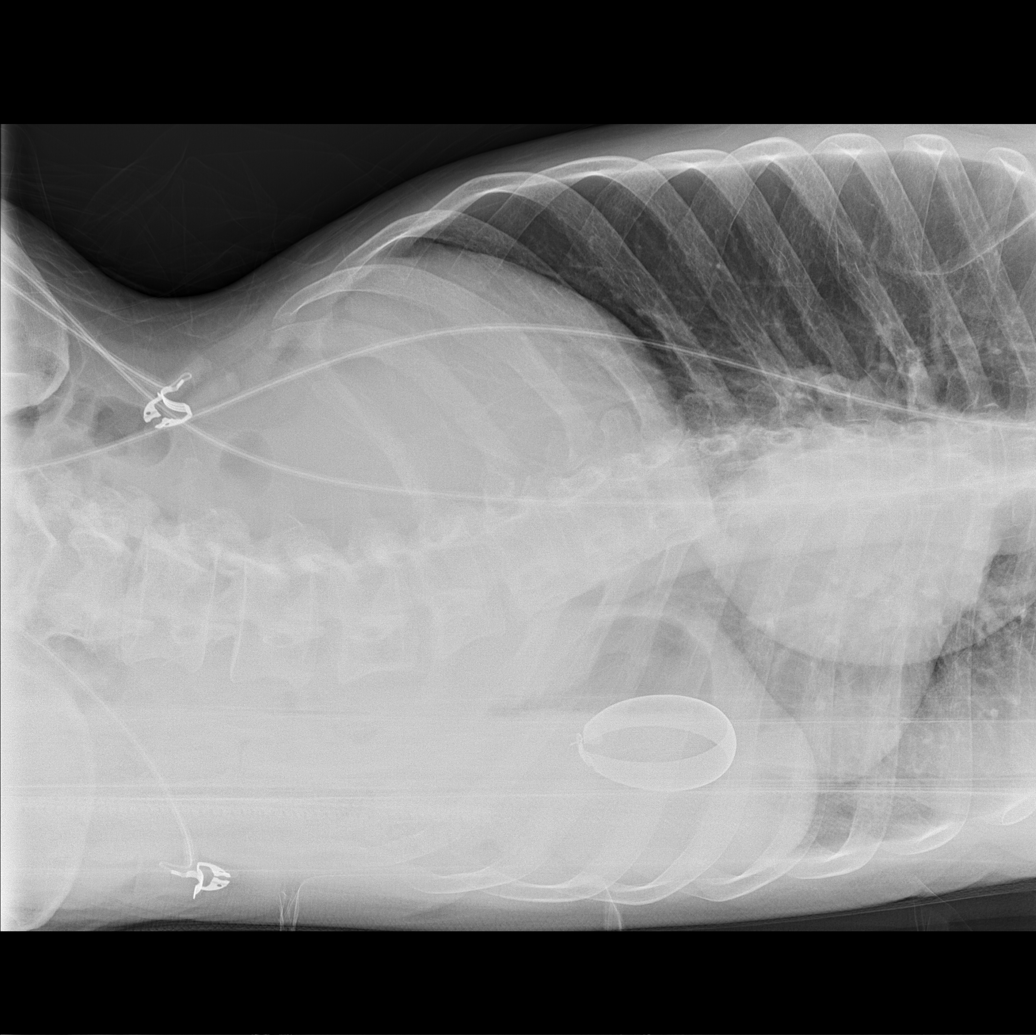

[w abdomen decub (2 of 2)]
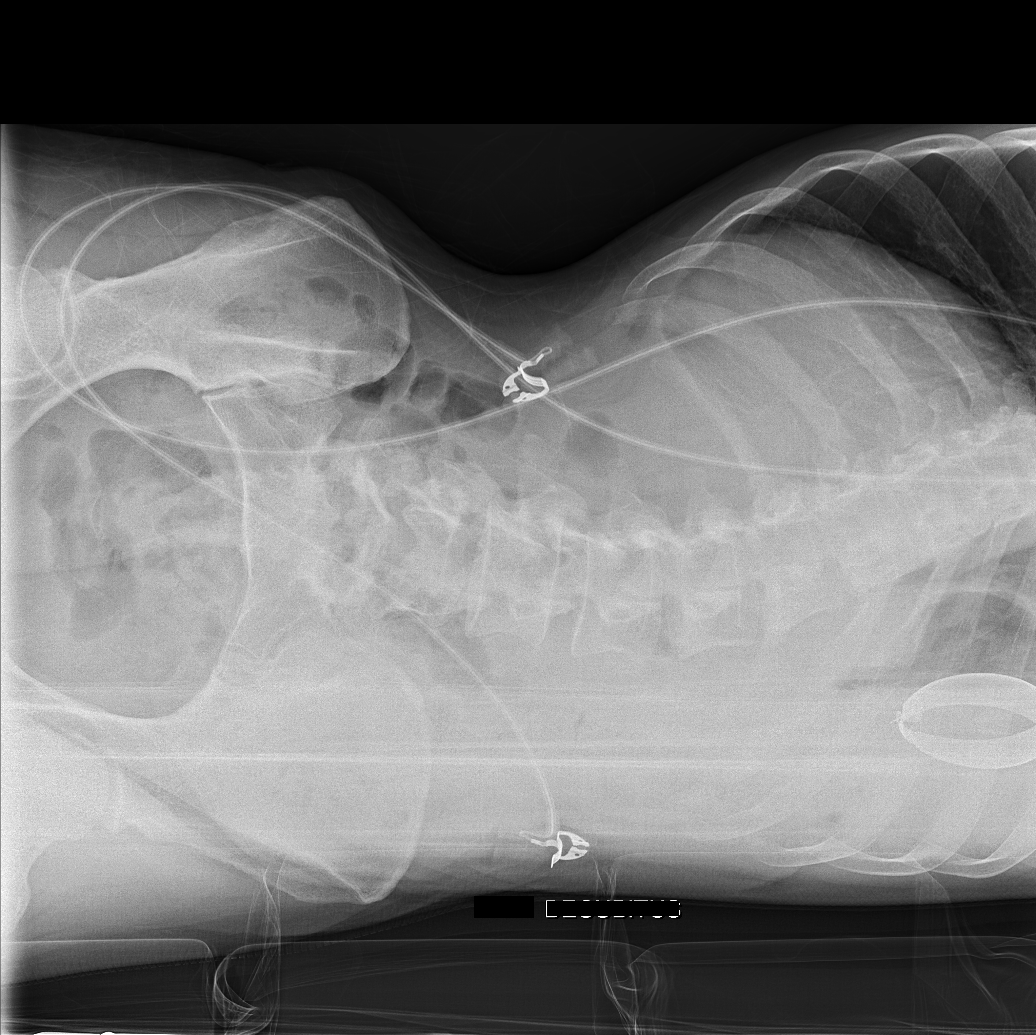

[x abdomen supine (1 of 2)]
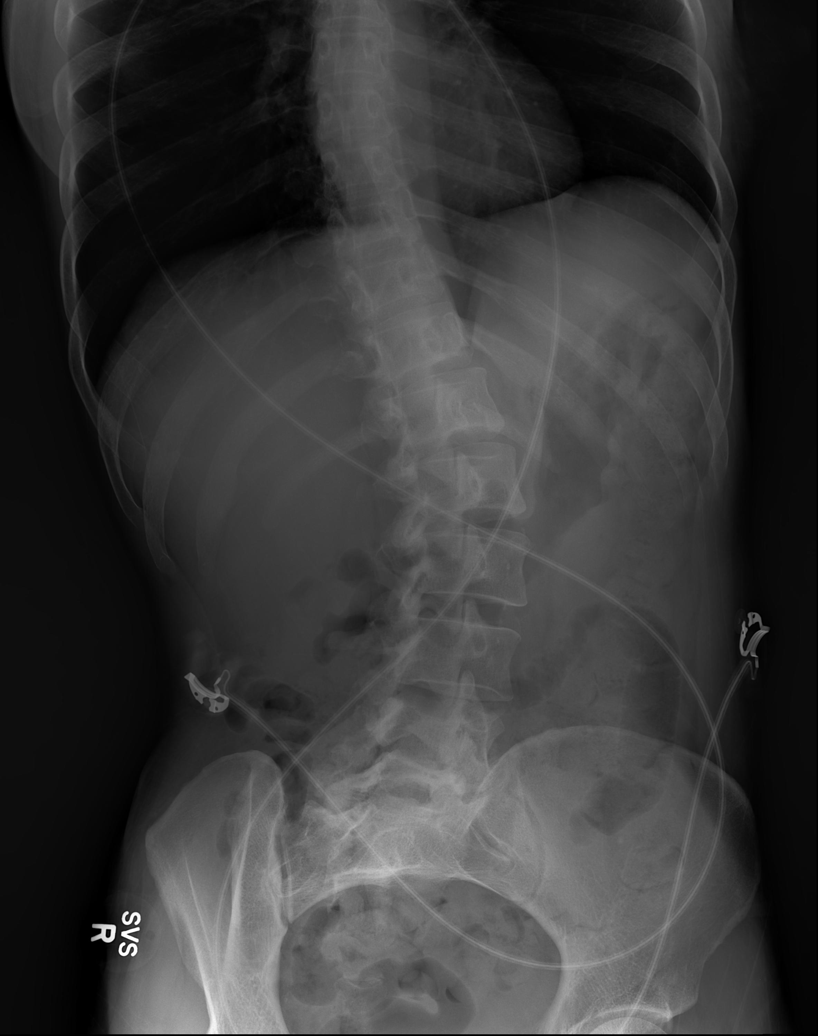

[x abdomen supine (2 of 2)]
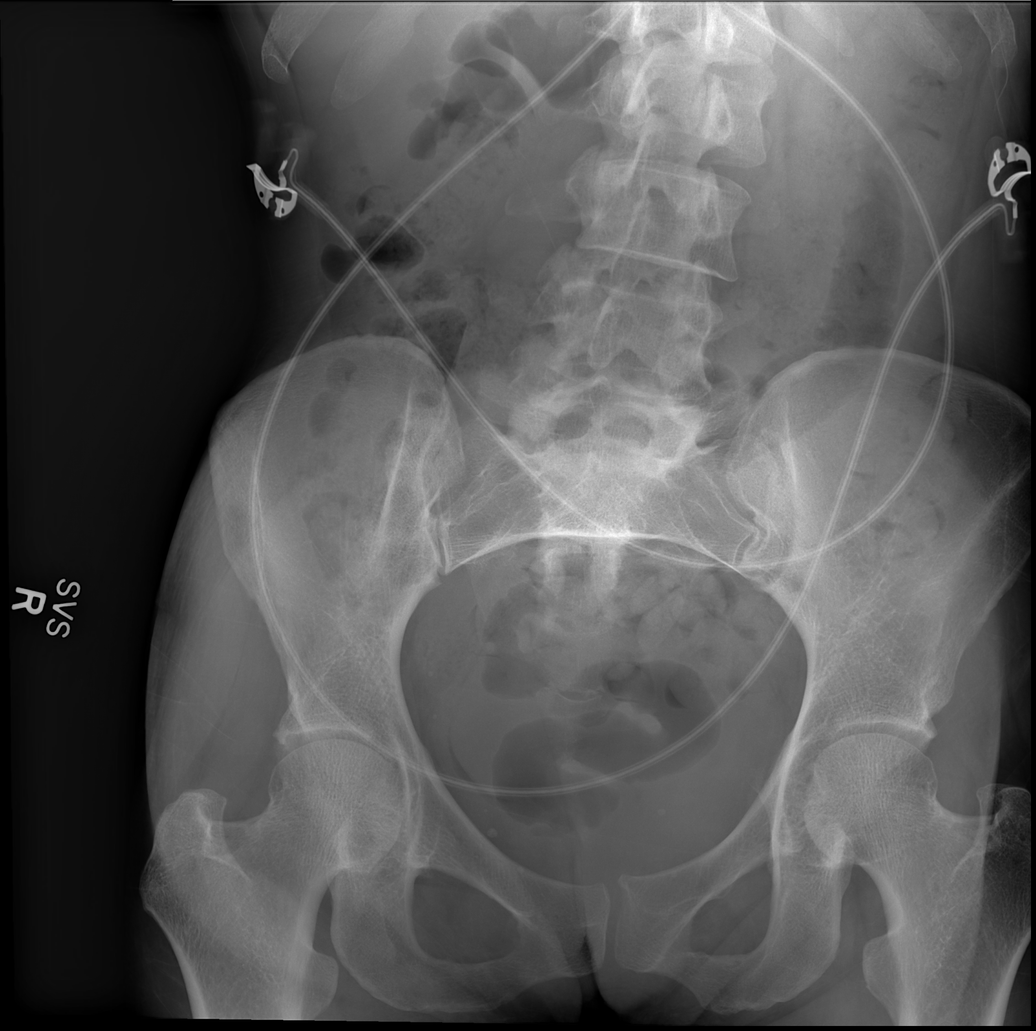

[x chest ap]
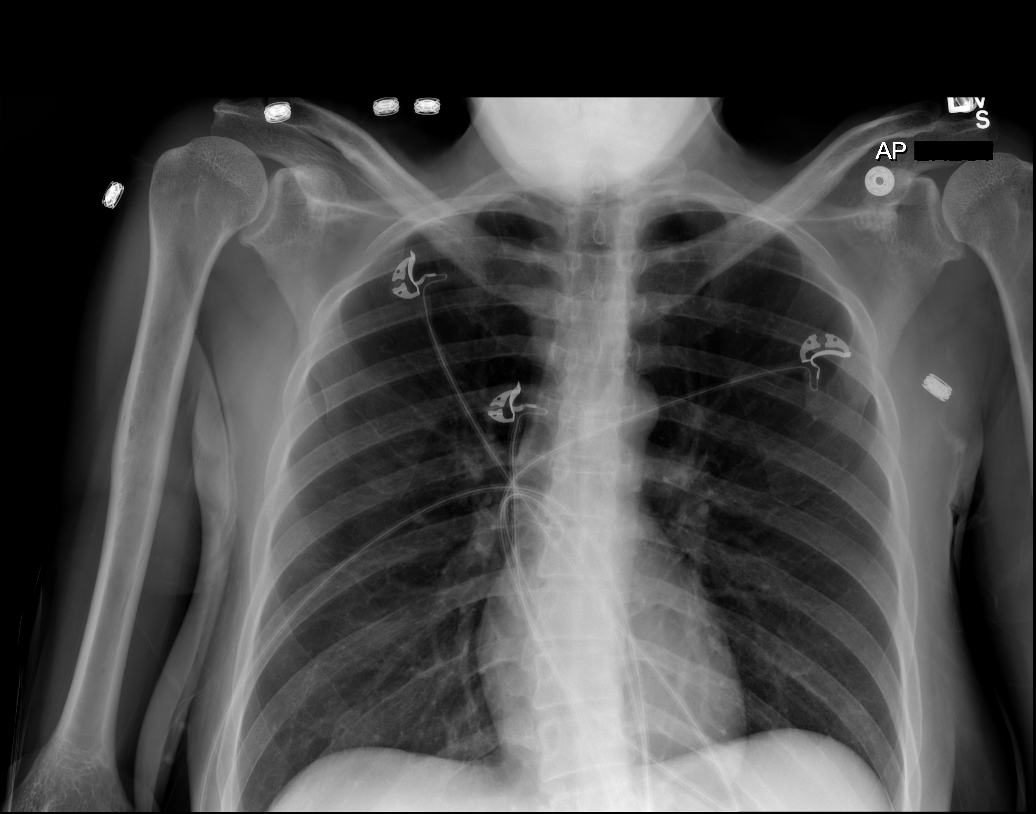

[5 of 5 positions shown; findings below may reference images not displayed]

FINDINGS: There is a slight convex right scoliosis of the lower
thoracic spine and a moderate rotatory convex left scoliosis of the
lumbar spine.  The lungs are clear.  Heart size is normal.  There
is no visible pleural effusion.  No evidence of free
intraperitoneal air on decubitus views of the abdomen.  The bowel
gas pattern is nonobstructive.  No radiopaque urinary tract
calculus is identified.
IMPRESSION: 1.  Nonobstructive bowel gas pattern.
2.  No acute cardiopulmonary sees.
3.  Thoracolumbar scoliosis.

## 2012-05-13 ENCOUNTER — Inpatient Hospital Stay (HOSPITAL_COMMUNITY): Payer: Medicaid Other

## 2012-05-13 ENCOUNTER — Encounter (HOSPITAL_COMMUNITY): Payer: Self-pay | Admitting: *Deleted

## 2012-05-13 ENCOUNTER — Inpatient Hospital Stay (HOSPITAL_COMMUNITY)
Admission: AD | Admit: 2012-05-13 | Discharge: 2012-05-13 | Disposition: A | Discharge status: Home / Self Care | Payer: Medicaid Other | Source: Ambulatory Visit | Attending: Obstetrics & Gynecology | Admitting: Obstetrics & Gynecology

## 2012-05-13 DIAGNOSIS — N946 Dysmenorrhea, unspecified: Secondary | ICD-10-CM | POA: Diagnosis not present

## 2012-05-13 DIAGNOSIS — R102 Pelvic and perineal pain: Secondary | ICD-10-CM

## 2012-05-13 DIAGNOSIS — R110 Nausea: Secondary | ICD-10-CM | POA: Diagnosis not present

## 2012-05-13 DIAGNOSIS — F1910 Other psychoactive substance abuse, uncomplicated: Secondary | ICD-10-CM | POA: Diagnosis not present

## 2012-05-13 DIAGNOSIS — N898 Other specified noninflammatory disorders of vagina: Secondary | ICD-10-CM

## 2012-05-13 DIAGNOSIS — R109 Unspecified abdominal pain: Secondary | ICD-10-CM | POA: Diagnosis present

## 2012-05-13 DIAGNOSIS — E119 Type 2 diabetes mellitus without complications: Secondary | ICD-10-CM | POA: Insufficient documentation

## 2012-05-13 DIAGNOSIS — N949 Unspecified condition associated with female genital organs and menstrual cycle: Secondary | ICD-10-CM | POA: Insufficient documentation

## 2012-05-13 DIAGNOSIS — D259 Leiomyoma of uterus, unspecified: Secondary | ICD-10-CM | POA: Diagnosis not present

## 2012-05-13 DIAGNOSIS — N938 Other specified abnormal uterine and vaginal bleeding: Secondary | ICD-10-CM | POA: Insufficient documentation

## 2012-05-13 DIAGNOSIS — N939 Abnormal uterine and vaginal bleeding, unspecified: Secondary | ICD-10-CM

## 2012-05-13 LAB — GC/CHLAMYDIA PROBE AMP: CT Probe RNA: NEGATIVE

## 2012-05-13 LAB — COMPREHENSIVE METABOLIC PANEL
ALT: 29 U/L (ref 0–35)
BUN: 17 mg/dL (ref 6–23)
CO2: 25 mEq/L (ref 19–32)
Calcium: 9.7 mg/dL (ref 8.4–10.5)
Creatinine, Ser: 0.45 mg/dL — ABNORMAL LOW (ref 0.50–1.10)
GFR calc Af Amer: >90 mL/min (ref >90)
GFR calc non Af Amer: >90 mL/min (ref >90)
Glucose, Bld: 305 mg/dL — ABNORMAL HIGH (ref 70–99)
Total Protein: 7.2 g/dL (ref 6.0–8.3)

## 2012-05-13 LAB — CBC WITH DIFFERENTIAL/PLATELET
Basophils Relative: 0 % (ref 0–1)
Eosinophils Relative: 1 % (ref 0–5)
HCT: 37.6 % (ref 36.0–46.0)
Lymphs Abs: 2.9 10*3/uL (ref 0.7–4.0)
MCH: 23.2 pg — ABNORMAL LOW (ref 26.0–34.0)
MCV: 72.2 fL — ABNORMAL LOW (ref 78.0–100.0)
Monocytes Absolute: 0.4 10*3/uL (ref 0.1–1.0)
Monocytes Relative: 5 % (ref 3–12)
Neutro Abs: 5.1 10*3/uL (ref 1.7–7.7)
RBC: 5.21 MIL/uL — ABNORMAL HIGH (ref 3.87–5.11)
WBC: 8.5 10*3/uL (ref 4.0–10.5)

## 2012-05-13 LAB — POCT PREGNANCY, URINE: Preg Test, Ur: NEGATIVE

## 2012-05-13 LAB — GLUCOSE, CAPILLARY

## 2012-05-13 LAB — WET PREP, GENITAL: Trich, Wet Prep: NONE SEEN

## 2012-05-13 LAB — RAPID URINE DRUG SCREEN, HOSP PERFORMED
Benzodiazepines: NOT DETECTED
Cocaine: NOT DETECTED
Opiates: NOT DETECTED

## 2012-05-13 MED ORDER — PROMETHAZINE HCL 25 MG PO TABS: 12.5000 mg | ORAL_TABLET | Freq: Four times a day (QID) | ORAL | Status: AC | PRN

## 2012-05-13 MED ORDER — LACTATED RINGERS IV SOLN
INTRAVENOUS | Status: DC
  Administered 2012-05-13: 05:00:00 via INTRAVENOUS

## 2012-05-13 MED ORDER — IBUPROFEN 600 MG PO TABS
600.0000 mg | ORAL_TABLET | Freq: Once | ORAL | Status: AC
  Administered 2012-05-13: 600 mg via ORAL
  Filled 2012-05-13: qty 1

## 2012-05-13 MED ORDER — HYDROMORPHONE HCL PF 1 MG/ML IJ SOLN
1.0000 mg | Freq: Once | INTRAMUSCULAR | Status: AC
  Administered 2012-05-13: 1 mg via INTRAVENOUS
  Filled 2012-05-13: qty 1

## 2012-05-13 MED ORDER — OXYCODONE-ACETAMINOPHEN 5-325 MG PO TABS: 1.0000 | ORAL_TABLET | ORAL | Status: DC | PRN

## 2012-05-13 MED ORDER — ONDANSETRON HCL 4 MG/2ML IJ SOLN
4.0000 mg | Freq: Once | INTRAMUSCULAR | Status: AC
  Administered 2012-05-13: 4 mg via INTRAVENOUS
  Filled 2012-05-13: qty 2

## 2012-05-13 NOTE — MAU Provider Note (Signed)
History     CSN: [id number]  Arrival date and time: 05/13/12 0415   First Provider Initiated Contact with Patient 05/13/12 0454      Chief Complaint  Patient presents with  . Vaginal Bleeding   HPI Mary [name] is a 41 y.o. female @ who presents to MAU via EMS with abdominal pain. The pain started 2 days ago. She rates the pain as 10/10. She had a normal period 04/17/12 and then started bleeding again 05/10/12. She describes the bleeding as heavy with clots. Using 4 regular pads per day. Unsure of last pap smear. No GYN, has a Family doctor. Hx of trichomonas. Patient has had multiple visits to ED's with chronic abdominal pain, uncontrolled diabetes and substance abuse. Patient did not taken her insulin last night because she didn't feel well. The history was provided by the patient and her medical record.  OB History    Grav Para Term Preterm Abortions TAB SAB Ect Mult Living   4 2              Past Medical History  Diagnosis Date  . Diabetes mellitus     diagnosed in 1996-always been on insulin  . Kidney stone   . Seizure disorder     started with pregnancy of first son  . Gastritis   . Bipolar 2 disorder   . Post traumatic stress disorder     "flipping out" after people close to her died  . DKA (diabetic ketoacidoses)     Recurrent admissions for DKA, medication non-complaince, poor social situation  . Cirrhosis   . Angina   . Heart murmur   . Shortness of breath     occassionally  . Seizures     pt states 59yrs ago  . Headache     occassionally  . Arthritis     r ankle-S/P surgery 19-Nov-2005)  . Anxiety   . Peripheral vascular disease     numbness bilaterally feet  . Anemia     childhood  . H/O hiatal hernia     2 yrs ago  . Depression     childhood  . Hep C w/o coma, chronic     biopsy in 2010-no rx-was supposed to see a hepatologist in [place]  . Bipolar disorder   . PTSD (post-traumatic stress disorder)   . Neuropathy     in legs and feet and hands     Past Surgical History  Procedure Date  . Ankle surgery   . Cesarean section     Family History  Problem Relation Age of Onset  . Diabetes Mother     currently 33  . Fibromyalgia Mother   . Cirrhosis Father     died in 1997/11/19    History  Substance Use Topics  . Smoking status: Current Every Day Smoker -- 0.1 packs/day for 25 years    Types: Cigarettes  . Smokeless tobacco: Never Used  . Alcohol Use: No     Comment: Endorses hasn't been drinking    Allergies:  Allergies  Allergen Reactions  . Penicillins Rash    Prescriptions prior to admission  Medication Sig Dispense Refill  . gabapentin (NEURONTIN) 300 MG capsule Take 1 capsule (300 mg total) by mouth 2 (two) times daily.  62 capsule  0  . oxyCODONE (OXY IR/ROXICODONE) 5 MG immediate release tablet Take 1 tablet (5 mg total) by mouth every 4 (four) hours as needed.  20 tablet  0  . oxyCODONE (ROXICODONE)  5 MG immediate release tablet Take 1 tablet (5 mg total) by mouth every 4 (four) hours as needed for pain.  12 tablet  0  . aspirin EC 81 MG tablet Take 81 mg by mouth daily.       . insulin aspart protamine-insulin aspart (NOVOLOG 70/30) (70-30) 100 UNIT/ML injection Inject 25 Units into the skin 2 (two) times daily with a meal.  20 mL  0  . lithium carbonate (LITHOBID) 300 MG CR tablet Take 2 tablets (600 mg total) by mouth at bedtime.  62 tablet  0  . metoCLOPramide (REGLAN) 10 MG tablet Take 1 tablet (10 mg total) by mouth every 6 (six) hours.  30 tablet  0  . ondansetron (ZOFRAN) 4 MG tablet Take 1 tablet (4 mg total) by mouth every 8 (eight) hours as needed for nausea.  20 tablet  0  . promethazine (PHENERGAN) 25 MG tablet Take 1 tablet (25 mg total) by mouth every 6 (six) hours as needed for nausea.  12 tablet  0  . promethazine (PHENERGAN) 25 MG tablet Take 1 tablet (25 mg total) by mouth every 6 (six) hours as needed for nausea.  20 tablet  0  . QUEtiapine (SEROQUEL) 50 MG tablet Take 1 tablet (50 mg total) by  mouth at bedtime.  31 tablet  0    Review of Systems  Constitutional: Negative for fever and chills.  HENT: Positive for congestion. Negative for neck pain.   Respiratory: Negative for cough and wheezing.   Cardiovascular: Negative for chest pain.  Gastrointestinal: Positive for nausea, vomiting and abdominal pain.  Genitourinary: Negative for urgency and frequency.       Vaginal bleeding  Musculoskeletal: Negative for back pain.  Neurological: Positive for seizures and headaches. Negative for dizziness.  Psychiatric/Behavioral: Positive for depression (bipolar). The patient is nervous/anxious.    Physical Exam   Blood pressure 121/67, pulse 62, temperature 98 F (36.7 C), temperature source Oral, resp. rate 18, last menstrual period 05/10/2012.  Physical Exam  Nursing note and vitals reviewed. Constitutional: She is oriented to person, place, and time. No distress.       Thin white female  HENT:  Head: Normocephalic and atraumatic.  Eyes: EOM are normal.  Neck: Neck supple.  Cardiovascular: Normal rate.   Respiratory: Effort normal.  GI: Soft. Normal appearance. There is generalized tenderness. There is no rigidity, no rebound, no guarding and no CVA tenderness.  Genitourinary:       External genitalia without lesions. Small blood vaginal vault. Positive CMT, bilateral adnexal tenderness. Uterus slightly enlarged.  Musculoskeletal: Normal range of motion.  Neurological: She is alert and oriented to person, place, and time.  Skin: Skin is warm and dry.  Psychiatric: She has a normal mood and affect. Her behavior is normal. Judgment and thought content normal.   Results for orders placed during the hospital encounter of 05/13/12 (from the past 24 hour(s))  WET PREP, GENITAL     Status: Abnormal   Collection Time   05/13/12  4:56 AM      Component Value Range   Yeast Wet Prep HPF POC NONE SEEN  NONE SEEN   Trich, Wet Prep NONE SEEN  NONE SEEN   Clue Cells Wet Prep HPF POC  FEW (*) NONE SEEN   WBC, Wet Prep HPF POC FEW (*) NONE SEEN  CBC WITH DIFFERENTIAL     Status: Abnormal   Collection Time   05/13/12  5:20 AM  Component Value Range   WBC 8.5  4.0 - 10.5 K/uL   RBC 5.21 (*) 3.87 - 5.11 MIL/uL   Hemoglobin 12.1  12.0 - 15.0 g/dL   HCT 40.9  81.1 - 91.4 %   MCV 72.2 (*) 78.0 - 100.0 fL   MCH 23.2 (*) 26.0 - 34.0 pg   MCHC 32.2  30.0 - 36.0 g/dL   RDW 78.2  95.6 - 21.3 %   Platelets 132 (*) 150 - 400 K/uL   Neutrophils Relative 60  43 - 77 %   Lymphocytes Relative 34  12 - 46 %   Monocytes Relative 5  3 - 12 %   Eosinophils Relative 1  0 - 5 %   Basophils Relative 0  0 - 1 %   Neutro Abs 5.1  1.7 - 7.7 K/uL   Lymphs Abs 2.9  0.7 - 4.0 K/uL   Monocytes Absolute 0.4  0.1 - 1.0 K/uL   Eosinophils Absolute 0.1  0.0 - 0.7 K/uL   Basophils Absolute 0.0  0.0 - 0.1 K/uL  COMPREHENSIVE METABOLIC PANEL     Status: Abnormal   Collection Time   05/13/12  5:20 AM      Component Value Range   Sodium 137  135 - 145 mEq/L   Potassium 3.9  3.5 - 5.1 mEq/L   Chloride 98  96 - 112 mEq/L   CO2 25  19 - 32 mEq/L   Glucose, Bld 305 (*) 70 - 99 mg/dL   BUN 17  6 - 23 mg/dL   Creatinine, Ser 0.86 (*) 0.50 - 1.10 mg/dL   Calcium 9.7  8.4 - 57.8 mg/dL   Total Protein 7.2  6.0 - 8.3 g/dL   Albumin 3.7  3.5 - 5.2 g/dL   AST 29  0 - 37 U/L   ALT 29  0 - 35 U/L   Alkaline Phosphatase 88  39 - 117 U/L   Total Bilirubin 0.4  0.3 - 1.2 mg/dL   GFR calc non Af Amer >90  >90 mL/min   GFR calc Af Amer >90  >90 mL/min  URINE RAPID DRUG SCREEN (HOSP PERFORMED)     Status: Abnormal   Collection Time   05/13/12  5:50 AM      Component Value Range   Opiates NONE DETECTED  NONE DETECTED   Cocaine NONE DETECTED  NONE DETECTED   Benzodiazepines NONE DETECTED  NONE DETECTED   Amphetamines NONE DETECTED  NONE DETECTED   Tetrahydrocannabinol POSITIVE (*) NONE DETECTED   Barbiturates POSITIVE (*) NONE DETECTED  POCT PREGNANCY, URINE     Status: Normal   Collection Time    05/13/12  5:57 AM      Component Value Range   Preg Test, Ur NEGATIVE  NEGATIVE  GLUCOSE, CAPILLARY     Status: Abnormal   Collection Time   05/13/12  7:35 AM      Component Value Range   Glucose-Capillary 275 (*) 70 - 99 mg/dL   Comment 1 Documented in Chart     Comment 2 Notify RN     US Transvaginal Non-ob  05/13/2012  *RADIOLOGY REPORT*  Clinical Data: Pelvic pain and vaginal bleeding.  TRANSABDOMINAL AND TRANSVAGINAL ULTRASOUND OF PELVIS Technique:  Both transabdominal and transvaginal ultrasound examinations of the pelvis were performed. Transabdominal technique was performed for global imaging of the pelvis including uterus, ovaries, adnexal regions, and pelvic cul-de-sac.  It was necessary to proceed with endovaginal exam following the transabdominal exam  to visualize the uterus and ovaries in greater detail.  Comparison:  Pelvic ultrasound performed 07/06/2009, and CT of the abdomen and pelvis performed 09/12/2011  Findings:  Uterus: Normal in size; small 0.9 cm fibroid incidentally noted along the posterior aspect of the fundus.  Measures 7.9 x 3.9 x 4.2 cm.  Endometrium: Normal in thickness and appearance; measures 0.4 cm in thickness.  Right ovary:  Measures 3.2 x 2.2 x 1.9 cm.  A complex 1.9 cm cystic focus at the right ovary is nonspecific but could reflect a small hemorrhagic cyst.  Left ovary: Normal appearance/no adnexal mass; measures 2.6 x 1.8 x 1.4 cm.  Other findings: No free fluid seen within the pelvic cul-de-sac.  IMPRESSION: 1.  Small complex 1.9 cm cystic focus of the right ovary is nonspecific but could reflect a small hemorrhagic cyst. 2.  Small myometrial fibroid noted at the posterior fundus.   Original Report Authenticated By: Tonia Ghent, M.D.    US Pelvis Complete  05/13/2012  *RADIOLOGY REPORT*  Clinical Data: Pelvic pain and vaginal bleeding.  TRANSABDOMINAL AND TRANSVAGINAL ULTRASOUND OF PELVIS Technique:  Both transabdominal and transvaginal ultrasound  examinations of the pelvis were performed. Transabdominal technique was performed for global imaging of the pelvis including uterus, ovaries, adnexal regions, and pelvic cul-de-sac.  It was necessary to proceed with endovaginal exam following the transabdominal exam to visualize the uterus and ovaries in greater detail.  Comparison:  Pelvic ultrasound performed 07/06/2009, and CT of the abdomen and pelvis performed 09/12/2011  Findings:  Uterus: Normal in size; small 0.9 cm fibroid incidentally noted along the posterior aspect of the fundus.  Measures 7.9 x 3.9 x 4.2 cm.  Endometrium: Normal in thickness and appearance; measures 0.4 cm in thickness.  Right ovary:  Measures 3.2 x 2.2 x 1.9 cm.  A complex 1.9 cm cystic focus at the right ovary is nonspecific but could reflect a small hemorrhagic cyst.  Left ovary: Normal appearance/no adnexal mass; measures 2.6 x 1.8 x 1.4 cm.  Other findings: No free fluid seen within the pelvic cul-de-sac.  IMPRESSION: 1.  Small complex 1.9 cm cystic focus of the right ovary is nonspecific but could reflect a small hemorrhagic cyst. 2.  Small myometrial fibroid noted at the posterior fundus.   Original Report Authenticated By: Tonia Ghent, M.D.     Assessment: 41 y.o. female with vaginal bleeding and abdominal pain   Uterine fibroid   Dysmenorrhea   Diabetes   Nausea   Substance abuse  Plan:  IVLR   Dilaudid 1 mg IV   Zofran 4 mg IV   Pelvic ultrasound  @07 :00 patient is back from ultrasound and rates her pain as 1/10  Discussed with Dr. Belva Bertin blood sugar 275 Note: Discussed ultrasound, lab and clinical findings in detail with the patient and need for close follow up. She voices understanding. Patient states that she has a new doctor and he has been trying to adjust her insulin to try and stabilize her diabetes because she was hospitalized several times last year for DKA. Her blood sugars have been running about 300 but she and the doctor are aware  and will be adjusting the insulin next visit.  The patient will call the GYN Clinic for follow up of the fibroid and pelvic pain/bleeding.  She has been on oxycodone for chronic pain and is out of her medication. I discussed with her that I will give her a few Percocet until she can see her  PCP, however, he will need to write the Rx for a month supply.   I have reviewed this patient's vital signs, nurses notes, appropriate labs and imaging.    Medication List     As of 05/13/2012  8:00 AM    START taking these medications         oxyCODONE-acetaminophen 5-325 MG per tablet   Commonly known as: PERCOCET/ROXICET   Take 1 tablet by mouth every 4 (four) hours as needed for pain.      CHANGE how you take these medications         promethazine 25 MG tablet   Commonly known as: PHENERGAN   Take 0.5 tablets (12.5 mg total) by mouth every 6 (six) hours as needed for nausea.   What changed: dose      CONTINUE taking these medications         aspirin EC 81 MG tablet      gabapentin 300 MG capsule   Commonly known as: NEURONTIN   Take 1 capsule (300 mg total) by mouth 2 (two) times daily.      insulin aspart protamine-insulin aspart (70-30) 100 UNIT/ML injection   Commonly known as: NOVOLOG 70/30   Inject 25 Units into the skin 2 (two) times daily with a meal.      lithium carbonate 300 MG CR tablet   Commonly known as: LITHOBID   Take 2 tablets (600 mg total) by mouth at bedtime.      metoCLOPramide 10 MG tablet   Commonly known as: REGLAN   Take 1 tablet (10 mg total) by mouth every 6 (six) hours.      ondansetron 4 MG tablet   Commonly known as: ZOFRAN   Take 1 tablet (4 mg total) by mouth every 8 (eight) hours as needed for nausea.      QUEtiapine 50 MG tablet   Commonly known as: SEROQUEL   Take 1 tablet (50 mg total) by mouth at bedtime.      STOP taking these medications         oxyCODONE 5 MG immediate release tablet   Commonly known as: Oxy IR/ROXICODONE            Where to get your medications    These are the prescriptions that you need to pick up.   You may get these medications from any pharmacy.         oxyCODONE-acetaminophen 5-325 MG per tablet   promethazine 25 MG tablet           Procedures  NEESE,HOPE, RN, FNP, BC 05/13/2012, 7:40 AM

## 2012-05-13 NOTE — MAU Note (Signed)
Pt reports vaginal bleeding and pain x 4 days, using 4 pads per day.

## 2012-05-13 NOTE — MAU Note (Signed)
No adverse reaction from ibuprofen, rates pain 4/10.

## 2012-05-17 ENCOUNTER — Encounter (HOSPITAL_COMMUNITY): Payer: Self-pay | Admitting: Emergency Medicine

## 2012-05-17 ENCOUNTER — Emergency Department (HOSPITAL_COMMUNITY)
Admission: EM | Admit: 2012-05-17 | Discharge: 2012-05-18 | Disposition: A | Discharge status: Home / Self Care | Payer: Medicaid Other | Attending: Emergency Medicine | Admitting: Emergency Medicine

## 2012-05-17 DIAGNOSIS — E119 Type 2 diabetes mellitus without complications: Secondary | ICD-10-CM | POA: Insufficient documentation

## 2012-05-17 DIAGNOSIS — R1084 Generalized abdominal pain: Secondary | ICD-10-CM | POA: Diagnosis not present

## 2012-05-17 DIAGNOSIS — Z794 Long term (current) use of insulin: Secondary | ICD-10-CM | POA: Insufficient documentation

## 2012-05-17 DIAGNOSIS — Z7982 Long term (current) use of aspirin: Secondary | ICD-10-CM | POA: Diagnosis not present

## 2012-05-17 DIAGNOSIS — F17200 Nicotine dependence, unspecified, uncomplicated: Secondary | ICD-10-CM | POA: Diagnosis not present

## 2012-05-17 DIAGNOSIS — Z79899 Other long term (current) drug therapy: Secondary | ICD-10-CM | POA: Insufficient documentation

## 2012-05-17 DIAGNOSIS — R110 Nausea: Secondary | ICD-10-CM | POA: Diagnosis not present

## 2012-05-17 DIAGNOSIS — N946 Dysmenorrhea, unspecified: Secondary | ICD-10-CM | POA: Diagnosis present

## 2012-05-17 DIAGNOSIS — Z8679 Personal history of other diseases of the circulatory system: Secondary | ICD-10-CM | POA: Insufficient documentation

## 2012-05-17 DIAGNOSIS — Z7901 Long term (current) use of anticoagulants: Secondary | ICD-10-CM | POA: Insufficient documentation

## 2012-05-17 DIAGNOSIS — G40909 Epilepsy, unspecified, not intractable, without status epilepticus: Secondary | ICD-10-CM | POA: Insufficient documentation

## 2012-05-17 DIAGNOSIS — Z8659 Personal history of other mental and behavioral disorders: Secondary | ICD-10-CM | POA: Insufficient documentation

## 2012-05-17 DIAGNOSIS — Z8719 Personal history of other diseases of the digestive system: Secondary | ICD-10-CM | POA: Insufficient documentation

## 2012-05-17 LAB — GLUCOSE, CAPILLARY: Glucose-Capillary: 283 mg/dL — ABNORMAL HIGH (ref 70–99)

## 2012-05-17 MED ORDER — ONDANSETRON 4 MG PO TBDP
4.0000 mg | ORAL_TABLET | Freq: Once | ORAL | Status: AC
  Administered 2012-05-17: 4 mg via ORAL
  Filled 2012-05-17: qty 1

## 2012-05-17 MED ORDER — OXYCODONE-ACETAMINOPHEN 5-325 MG PO TABS
2.0000 | ORAL_TABLET | Freq: Once | ORAL | Status: AC
  Administered 2012-05-17: 2 via ORAL
  Filled 2012-05-17: qty 2

## 2012-05-17 MED ORDER — KETOROLAC TROMETHAMINE 30 MG/ML IJ SOLN
30.0000 mg | Freq: Once | INTRAMUSCULAR | Status: AC
  Administered 2012-05-17: 30 mg via INTRAMUSCULAR
  Filled 2012-05-17: qty 1

## 2012-05-17 NOTE — ED Provider Notes (Signed)
History     CSN: [id number]  Arrival date & time 05/17/12  11/29/08   First MD Initiated Contact with Patient 05/17/12 2211/11/30      Chief Complaint  Patient presents with  . Dysmenorrhea    (Consider location/radiation/quality/duration/timing/severity/associated sxs/prior treatment) HPI Comments: Pateint was seen yesterday for the same pain was give Rx for Percocet which she has not filled yet.  Now she states the pain is so bad that she is nauseated.   States that the last pain medication she had was on discharge from [facility] MAU unit this morning .  The history is provided by the patient.    Past Medical History  Diagnosis Date  . Diabetes mellitus     diagnosed in 1996-always been on insulin  . Kidney stone   . Seizure disorder     started with pregnancy of first son  . Gastritis   . Bipolar 2 disorder   . Post traumatic stress disorder     "flipping out" after people close to her died  . DKA (diabetic ketoacidoses)     Recurrent admissions for DKA, medication non-complaince, poor social situation  . Cirrhosis   . Angina   . Heart murmur   . Shortness of breath     occassionally  . Seizures     pt states 36yrs ago  . Headache     occassionally  . Arthritis     r ankle-S/P surgery 29-Nov-2005)  . Anxiety   . Peripheral vascular disease     numbness bilaterally feet  . Anemia     childhood  . H/O hiatal hernia     2 yrs ago  . Depression     childhood  . Hep C w/o coma, chronic     biopsy in 2010-no rx-was supposed to see a hepatologist in [place]  . Bipolar disorder   . PTSD (post-traumatic stress disorder)   . Neuropathy     in legs and feet and hands    Past Surgical History  Procedure Date  . Ankle surgery   . Cesarean section     Family History  Problem Relation Age of Onset  . Diabetes Mother     currently 61  . Fibromyalgia Mother   . Cirrhosis Father     died in 29-Nov-1997    History  Substance Use Topics  . Smoking status: Current Every Day  Smoker -- 0.1 packs/day for 25 years    Types: Cigarettes  . Smokeless tobacco: Never Used  . Alcohol Use: No     Comment: Endorses hasn't been drinking    OB History    Grav Para Term Preterm Abortions TAB SAB Ect Mult Living   4 2              Review of Systems  Constitutional: Negative for fever and activity change.  HENT: Negative.   Respiratory: Negative for shortness of breath.   Cardiovascular: Negative for chest pain.  Gastrointestinal: Positive for nausea, vomiting and abdominal pain.  Genitourinary: Negative for dysuria and vaginal discharge.  Musculoskeletal: Negative for back pain.  Skin: Negative for rash and wound.  Neurological: Negative for weakness and headaches.    Allergies  Penicillins  Home Medications   Current Outpatient Rx  Name  Route  Sig  Dispense  Refill  . ASPIRIN EC 81 MG PO TBEC   Oral   Take 81 mg by mouth daily.          Marland Kitchen CALCIUM CARBONATE  ANTACID 500 MG PO CHEW   Oral   Chew 2 tablets by mouth daily as needed. For heartburn         . GABAPENTIN 300 MG PO CAPS   Oral   Take 1 capsule (300 mg total) by mouth 2 (two) times daily.   62 capsule   0   . IBUPROFEN 800 MG PO TABS   Oral   Take 800 mg by mouth once.         . INSULIN ASPART PROT & ASPART (70-30) 100 UNIT/ML [place] SUSP   Subcutaneous   Inject 25 Units into the skin 2 (two) times daily with a meal.   20 mL   0   . INSULIN GLARGINE 100 UNIT/ML [place] SOLN   Subcutaneous   Inject 15 Units into the skin at bedtime.         Marland Kitchen METFORMIN HCL 500 MG PO TABS   Oral   Take 500 mg by mouth at bedtime.         . OXYCODONE HCL 5 MG PO CAPS   Oral   Take 5 mg by mouth every 4 (four) hours as needed. For pain         . OXYCODONE-ACETAMINOPHEN 5-325 MG PO TABS   Oral   Take 1 tablet by mouth every 4 (four) hours as needed for pain.   10 tablet   0   . PROMETHAZINE HCL 25 MG PO TABS   Oral   Take 0.5 tablets (12.5 mg total) by mouth every 6 (six) hours as needed  for nausea.   15 tablet   0   . RANITIDINE HCL 150 MG PO CAPS   Oral   Take 150 mg by mouth 2 (two) times daily as needed. For heartburn           BP 125/81  Pulse 78  Temp 98.9 F (37.2 C) (Oral)  Resp 25  SpO2 95%  LMP 05/10/2012  Physical Exam  Constitutional: She is oriented to person, place, and time. She appears well-developed and well-nourished.  HENT:  Head: Normocephalic and atraumatic.  Eyes: Pupils are equal, round, and reactive to light.  Neck: Normal range of motion.  Cardiovascular: Normal rate.   Pulmonary/Chest: Effort normal.  Abdominal: Soft. She exhibits no distension. There is no tenderness.  Musculoskeletal: Normal range of motion. She exhibits no edema and no tenderness.  Neurological: She is alert and oriented to person, place, and time.  Skin: Skin is warm and dry. No rash noted.    ED Course  Procedures (including critical care time)  Labs Reviewed - No data to display No results found.   No diagnosis found.    MDM   12:40 patient sleeping   Further episodes of nausea, or vomiting.  She's been sleeping soundly for the past 2 hours     Arman Filter, NP 05/18/12 0104  Arman Filter, NP 05/18/12 [id number]

## 2012-05-17 NOTE — ED Notes (Signed)
Per EMS, pt was seen at [facility] yesterday for the same complaint.

## 2012-05-18 MED ORDER — METOCLOPRAMIDE HCL 5 MG/ML IJ SOLN
10.0000 mg | Freq: Once | INTRAMUSCULAR | Status: AC
  Administered 2012-05-18: 10 mg via INTRAVENOUS
  Filled 2012-05-18: qty 2

## 2012-05-18 MED ORDER — DIPHENHYDRAMINE HCL 25 MG PO CAPS
25.0000 mg | ORAL_CAPSULE | Freq: Once | ORAL | Status: AC
  Administered 2012-05-18: 25 mg via ORAL
  Filled 2012-05-18: qty 1

## 2012-05-18 NOTE — ED Notes (Signed)
Pain accidentally charted as 7. Actually a 2.

## 2012-05-18 NOTE — ED Notes (Signed)
Gave the pt 240 ml.  Pt still experiencing N/V.

## 2012-05-23 NOTE — ED Provider Notes (Signed)
Medical screening examination/treatment/procedure(s) were performed by non-physician practitioner and as supervising physician I was immediately available for consultation/collaboration.  [name], MD 05/23/12 [phone]

## 2012-05-29 ENCOUNTER — Encounter: Payer: Self-pay | Admitting: Obstetrics & Gynecology

## 2012-06-06 ENCOUNTER — Emergency Department (HOSPITAL_COMMUNITY)
Admission: EM | Admit: 2012-06-06 | Discharge: 2012-06-07 | Disposition: A | Discharge status: Home / Self Care | Payer: Medicaid Other | Attending: Emergency Medicine | Admitting: Emergency Medicine

## 2012-06-06 ENCOUNTER — Encounter (HOSPITAL_COMMUNITY): Payer: Self-pay | Admitting: *Deleted

## 2012-06-06 DIAGNOSIS — Z862 Personal history of diseases of the blood and blood-forming organs and certain disorders involving the immune mechanism: Secondary | ICD-10-CM | POA: Insufficient documentation

## 2012-06-06 DIAGNOSIS — F17200 Nicotine dependence, unspecified, uncomplicated: Secondary | ICD-10-CM | POA: Insufficient documentation

## 2012-06-06 DIAGNOSIS — R109 Unspecified abdominal pain: Secondary | ICD-10-CM

## 2012-06-06 DIAGNOSIS — G40909 Epilepsy, unspecified, not intractable, without status epilepticus: Secondary | ICD-10-CM | POA: Insufficient documentation

## 2012-06-06 DIAGNOSIS — IMO0002 Reserved for concepts with insufficient information to code with codable children: Secondary | ICD-10-CM | POA: Insufficient documentation

## 2012-06-06 DIAGNOSIS — Z8659 Personal history of other mental and behavioral disorders: Secondary | ICD-10-CM | POA: Insufficient documentation

## 2012-06-06 DIAGNOSIS — Z8669 Personal history of other diseases of the nervous system and sense organs: Secondary | ICD-10-CM | POA: Insufficient documentation

## 2012-06-06 DIAGNOSIS — Z8679 Personal history of other diseases of the circulatory system: Secondary | ICD-10-CM | POA: Diagnosis not present

## 2012-06-06 DIAGNOSIS — E876 Hypokalemia: Secondary | ICD-10-CM

## 2012-06-06 DIAGNOSIS — R011 Cardiac murmur, unspecified: Secondary | ICD-10-CM | POA: Diagnosis not present

## 2012-06-06 DIAGNOSIS — Z794 Long term (current) use of insulin: Secondary | ICD-10-CM | POA: Diagnosis not present

## 2012-06-06 DIAGNOSIS — Z7982 Long term (current) use of aspirin: Secondary | ICD-10-CM | POA: Diagnosis not present

## 2012-06-06 DIAGNOSIS — D72829 Elevated white blood cell count, unspecified: Secondary | ICD-10-CM | POA: Insufficient documentation

## 2012-06-06 DIAGNOSIS — Z87442 Personal history of urinary calculi: Secondary | ICD-10-CM | POA: Diagnosis not present

## 2012-06-06 DIAGNOSIS — E1110 Type 2 diabetes mellitus with ketoacidosis without coma: Secondary | ICD-10-CM | POA: Insufficient documentation

## 2012-06-06 DIAGNOSIS — Z79899 Other long term (current) drug therapy: Secondary | ICD-10-CM | POA: Insufficient documentation

## 2012-06-06 DIAGNOSIS — G8929 Other chronic pain: Secondary | ICD-10-CM | POA: Insufficient documentation

## 2012-06-06 DIAGNOSIS — Z8739 Personal history of other diseases of the musculoskeletal system and connective tissue: Secondary | ICD-10-CM | POA: Diagnosis not present

## 2012-06-06 DIAGNOSIS — R112 Nausea with vomiting, unspecified: Secondary | ICD-10-CM | POA: Diagnosis not present

## 2012-06-06 DIAGNOSIS — Z8709 Personal history of other diseases of the respiratory system: Secondary | ICD-10-CM | POA: Diagnosis not present

## 2012-06-06 DIAGNOSIS — Z8719 Personal history of other diseases of the digestive system: Secondary | ICD-10-CM | POA: Insufficient documentation

## 2012-06-06 DIAGNOSIS — R1084 Generalized abdominal pain: Secondary | ICD-10-CM | POA: Diagnosis not present

## 2012-06-06 DIAGNOSIS — Z8619 Personal history of other infectious and parasitic diseases: Secondary | ICD-10-CM | POA: Insufficient documentation

## 2012-06-06 NOTE — ED Notes (Signed)
The pt is upset that she was not taken to [place] ed.  That is where she asked to go.

## 2012-06-06 NOTE — ED Notes (Signed)
Patient laying in floor, asked pt to get in wheelchair refused. Unable to draw blood with patient in floor. Informed Thayer Ohm of same.

## 2012-06-06 NOTE — ED Notes (Signed)
The pt is lying in the floor at triage refusing to get in the wheelchair

## 2012-06-06 NOTE — ED Notes (Signed)
The pt arrived by [facility]  With abd pain for 2 days.  She is crying hysterically in triage yelling and sliding out of the chair.  She reports that she has been vomiting.  lmp dec 1st

## 2012-06-07 LAB — RAPID URINE DRUG SCREEN, HOSP PERFORMED
Amphetamines: NOT DETECTED
Barbiturates: NOT DETECTED
Tetrahydrocannabinol: POSITIVE — AB

## 2012-06-07 LAB — CG4 I-STAT (LACTIC ACID): Lactic Acid, Venous: 1.26 mmol/L (ref 0.5–2.2)

## 2012-06-07 LAB — COMPREHENSIVE METABOLIC PANEL
Albumin: 4.2 g/dL (ref 3.5–5.2)
Alkaline Phosphatase: 101 U/L (ref 39–117)
BUN: 13 mg/dL (ref 6–23)
Calcium: 10.3 mg/dL (ref 8.4–10.5)
GFR calc Af Amer: >90 mL/min (ref >90)
Glucose, Bld: 272 mg/dL — ABNORMAL HIGH (ref 70–99)
Potassium: 4 mEq/L (ref 3.5–5.1)
Sodium: 137 mEq/L (ref 135–145)
Total Protein: 8.1 g/dL (ref 6.0–8.3)

## 2012-06-07 LAB — CBC WITH DIFFERENTIAL/PLATELET
Basophils Absolute: 0 10*3/uL (ref 0.0–0.1)
Basophils Relative: 0 % (ref 0–1)
Eosinophils Absolute: 0.3 10*3/uL (ref 0.0–0.7)
Lymphocytes Relative: 41 % (ref 12–46)
MCH: 23.2 pg — ABNORMAL LOW (ref 26.0–34.0)
MCHC: 32.7 g/dL (ref 30.0–36.0)
Monocytes Absolute: 0.7 10*3/uL (ref 0.1–1.0)
Neutro Abs: 7 10*3/uL (ref 1.7–7.7)
Neutrophils Relative %: 52 % (ref 43–77)
RDW: 14.2 % (ref 11.5–15.5)

## 2012-06-07 LAB — URINALYSIS, ROUTINE W REFLEX MICROSCOPIC
Bilirubin Urine: NEGATIVE
Glucose, UA: >1000 mg/dL — AB
Hgb urine dipstick: NEGATIVE
Ketones, ur: 40 mg/dL — AB
Protein, ur: NEGATIVE mg/dL

## 2012-06-07 LAB — GLUCOSE, CAPILLARY: Glucose-Capillary: 264 mg/dL — ABNORMAL HIGH (ref 70–99)

## 2012-06-07 MED ORDER — SODIUM CHLORIDE 0.9 % IV SOLN
1000.0000 mL | Freq: Once | INTRAVENOUS | Status: AC
  Administered 2012-06-07: 1000 mL via INTRAVENOUS

## 2012-06-07 MED ORDER — ONDANSETRON HCL 4 MG/2ML IJ SOLN
4.0000 mg | Freq: Once | INTRAMUSCULAR | Status: AC
  Administered 2012-06-07: 4 mg via INTRAVENOUS
  Filled 2012-06-07: qty 2

## 2012-06-07 MED ORDER — DROPERIDOL 2.5 MG/ML IJ SOLN: 1.2500 mg | Freq: Once | INTRAMUSCULAR | Status: DC

## 2012-06-07 MED ORDER — SODIUM CHLORIDE 0.9 % IV SOLN: 1000.0000 mL | INTRAVENOUS | Status: DC

## 2012-06-07 MED ORDER — LORAZEPAM 2 MG/ML IJ SOLN
1.0000 mg | Freq: Once | INTRAMUSCULAR | Status: AC
  Administered 2012-06-07: 1 mg via INTRAVENOUS
  Filled 2012-06-07: qty 1

## 2012-06-07 MED ORDER — OXYCODONE-ACETAMINOPHEN 5-325 MG PO TABS: 1.0000 | ORAL_TABLET | ORAL | Status: DC | PRN

## 2012-06-07 MED ORDER — PROMETHAZINE HCL 25 MG/ML IJ SOLN
25.0000 mg | Freq: Once | INTRAMUSCULAR | Status: AC
  Administered 2012-06-07: 25 mg via INTRAVENOUS
  Filled 2012-06-07: qty 1

## 2012-06-07 MED ORDER — FENTANYL CITRATE 0.05 MG/ML IJ SOLN
75.0000 ug | Freq: Once | INTRAMUSCULAR | Status: AC
  Administered 2012-06-07: 75 ug via INTRAVENOUS
  Filled 2012-06-07: qty 2

## 2012-06-07 NOTE — ED Notes (Signed)
Pt in nad, laying in pt c/o abd pain  "all over" along with n/v that all started this morning. Pt denies CP and diarrhea.

## 2012-06-07 NOTE — ED Notes (Signed)
Pt states understanding of discharge instructions 

## 2012-06-07 NOTE — ED Provider Notes (Signed)
History     CSN: [id number]  Arrival date & time 06/06/12  11-21-24   First MD Initiated Contact with Patient 06/07/12 0005      Chief Complaint  Patient presents with  . Abdominal Pain    (Consider location/radiation/quality/duration/timing/severity/associated sxs/prior treatment) HPI 41 year old female presents to the emergency room at [facility] with complaint of abdominal pain. Patient with abdominal pain for last 2 days. She reports the pain is diffuse from her belly button down to her bladder. Patient is a difficult historian secondary to pain and agitation. She is upset that EMS did not take her to [place] long. Patient has history of diabetes and DKA, she has been evaluated several times for lower abdominal pain per medical record. She reports this pain is similar to her prior pain. She does not have a glucometer, and does not know what her sugars have been running. She reports she's been giving herself her Lantus as prescribed. She reports she's had vomiting all day today. No fevers, no sick contacts, no diarrhea  Past Medical History  Diagnosis Date  . Diabetes mellitus     diagnosed in 1996-always been on insulin  . Kidney stone   . Seizure disorder     started with pregnancy of first son  . Gastritis   . Bipolar 2 disorder   . Post traumatic stress disorder     "flipping out" after people close to her died  . DKA (diabetic ketoacidoses)     Recurrent admissions for DKA, medication non-complaince, poor social situation  . Cirrhosis   . Angina   . Heart murmur   . Shortness of breath     occassionally  . Seizures     pt states 95yrs ago  . Headache     occassionally  . Arthritis     r ankle-S/P surgery 11-21-05)  . Anxiety   . Peripheral vascular disease     numbness bilaterally feet  . Anemia     childhood  . H/O hiatal hernia     2 yrs ago  . Depression     childhood  . Hep C w/o coma, chronic     biopsy in 2010-no rx-was supposed to see a hepatologist in  [place]  . Bipolar disorder   . PTSD (post-traumatic stress disorder)   . Neuropathy     in legs and feet and hands    Past Surgical History  Procedure Date  . Ankle surgery   . Cesarean section     Family History  Problem Relation Age of Onset  . Diabetes Mother     currently 65  . Fibromyalgia Mother   . Cirrhosis Father     died in Nov 21, 1997    History  Substance Use Topics  . Smoking status: Current Every Day Smoker -- 0.1 packs/day for 25 years    Types: Cigarettes  . Smokeless tobacco: Never Used  . Alcohol Use: No     Comment: Endorses hasn't been drinking    OB History    Grav Para Term Preterm Abortions TAB SAB Ect Mult Living   4 2              Review of Systems  Unable to perform ROS: Other   patient refuses to answer review of system questions  Allergies  Penicillins  Home Medications   Current Outpatient Rx  Name  Route  Sig  Dispense  Refill  . ASPIRIN EC 81 MG PO TBEC   Oral  Take 81 mg by mouth daily.          Marland Kitchen CALCIUM CARBONATE ANTACID 500 MG PO CHEW   Oral   Chew 2 tablets by mouth daily as needed. For heartburn         . GABAPENTIN 300 MG PO CAPS   Oral   Take 1 capsule (300 mg total) by mouth 2 (two) times daily.   62 capsule   0   . INSULIN GLARGINE 100 UNIT/ML [place] SOLN   Subcutaneous   Inject 15 Units into the skin at bedtime.         Marland Kitchen METFORMIN HCL 500 MG PO TABS   Oral   Take 500 mg by mouth at bedtime.         . OXYCODONE HCL 5 MG PO CAPS   Oral   Take 5 mg by mouth every 4 (four) hours as needed. For pain         . RANITIDINE HCL 150 MG PO CAPS   Oral   Take 150 mg by mouth 2 (two) times daily as needed. For heartburn         . OXYCODONE-ACETAMINOPHEN 5-325 MG PO TABS   Oral   Take 1 tablet by mouth every 4 (four) hours as needed for pain.   10 tablet   0     BP 121/68  Pulse 86  Temp 98.1 F (36.7 C) (Oral)  Resp 16  SpO2 100%  LMP 05/17/2012  Physical Exam  Nursing note and vitals  reviewed. Constitutional: She is oriented to person, place, and time. She appears well-developed and well-nourished. She appears distressed (patient is agitated, writhing on the bed).  HENT:  Head: Normocephalic and atraumatic.  Nose: Nose normal.       Dry mucous membranes, poor dentition  Eyes: Conjunctivae normal and EOM are normal. Pupils are equal, round, and reactive to light.  Neck: Normal range of motion. Neck supple. No JVD present. No tracheal deviation present. No thyromegaly present.  Cardiovascular: Normal rate, regular rhythm, normal heart sounds and intact distal pulses.  Exam reveals no gallop and no friction rub.   No murmur heard. Pulmonary/Chest: Effort normal and breath sounds normal. No stridor. No respiratory distress. She has no wheezes. She has no rales. She exhibits no tenderness.  Abdominal: Soft. Bowel sounds are normal. She exhibits no distension and no mass. There is tenderness (diffuse tenderness throughout the abdomen no rebound or guarding). There is no rebound and no guarding.  Musculoskeletal: Normal range of motion. She exhibits no edema and no tenderness.  Lymphadenopathy:    She has no cervical adenopathy.  Neurological: She is alert and oriented to person, place, and time. No cranial nerve deficit. She exhibits normal muscle tone. Coordination normal.  Skin: Skin is warm and dry. No rash noted. No erythema. No pallor.  Psychiatric: She has a normal mood and affect. Her behavior is normal. Judgment and thought content normal.    ED Course  Procedures (including critical care time)  Labs Reviewed  CBC WITH DIFFERENTIAL - Abnormal; Notable for the following:    WBC 13.5 (*)     RBC 5.56 (*)     MCV 71.0 (*)     MCH 23.2 (*)     Lymphs Abs 5.5 (*)     All other components within normal limits  COMPREHENSIVE METABOLIC PANEL - Abnormal; Notable for the following:    Glucose, Bld 272 (*)     Creatinine, Ser 0.46 (*)  All other components within  normal limits  URINALYSIS, ROUTINE W REFLEX MICROSCOPIC - Abnormal; Notable for the following:    APPearance CLOUDY (*)     Specific Gravity, Urine 1.039 (*)     Glucose, UA >1000 (*)     Ketones, ur 40 (*)     All other components within normal limits  URINE RAPID DRUG SCREEN (HOSP PERFORMED) - Abnormal; Notable for the following:    Tetrahydrocannabinol POSITIVE (*)     All other components within normal limits  GLUCOSE, CAPILLARY - Abnormal; Notable for the following:    Glucose-Capillary 264 (*)     All other components within normal limits  URINE MICROSCOPIC-ADD ON - Abnormal; Notable for the following:    Squamous Epithelial / LPF MANY (*)     Bacteria, UA FEW (*)     All other components within normal limits  LIPASE, BLOOD  CG4 I-STAT (LACTIC ACID)  URINE CULTURE   No results found.   1. Hypokalemia   2. Abdominal pain   3. Nausea & vomiting   4. Leukocytosis       MDM  41 year old female with acute on chronic abdominal pain associated with nausea and vomiting. She's been seen several times the past for same. 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 She is not in DKA at this time. She has been sleeping after Ativan and fentanyl. Her urine shows many squamous epithelials, will await for culture and not treat at this time. Will discharge home with short course of pain medicine while she follows up with her primary care Dr.        Olivia Mackie, MD 06/08/12 [phone]

## 2012-06-07 NOTE — ED Notes (Signed)
Pt aware urine is needed.  

## 2012-06-08 LAB — URINE CULTURE

## 2012-06-22 ENCOUNTER — Encounter: Payer: Self-pay | Admitting: Obstetrics and Gynecology

## 2012-06-22 ENCOUNTER — Other Ambulatory Visit (HOSPITAL_COMMUNITY)
Admission: RE | Admit: 2012-06-22 | Discharge: 2012-06-22 | Disposition: A | Discharge status: Home / Self Care | Payer: Medicaid Other | Source: Ambulatory Visit | Attending: Obstetrics and Gynecology | Admitting: Obstetrics and Gynecology

## 2012-06-22 ENCOUNTER — Ambulatory Visit (INDEPENDENT_AMBULATORY_CARE_PROVIDER_SITE_OTHER): Payer: Medicaid Other | Admitting: Obstetrics and Gynecology

## 2012-06-22 VITALS — BP 124/90 | HR 100 | Temp 98.6°F | Ht 64.0 in | Wt 115.1 lb

## 2012-06-22 DIAGNOSIS — Z01812 Encounter for preprocedural laboratory examination: Secondary | ICD-10-CM | POA: Diagnosis not present

## 2012-06-22 DIAGNOSIS — Z01419 Encounter for gynecological examination (general) (routine) without abnormal findings: Secondary | ICD-10-CM

## 2012-06-22 DIAGNOSIS — A599 Trichomoniasis, unspecified: Secondary | ICD-10-CM

## 2012-06-22 DIAGNOSIS — N925 Other specified irregular menstruation: Secondary | ICD-10-CM

## 2012-06-22 DIAGNOSIS — N949 Unspecified condition associated with female genital organs and menstrual cycle: Secondary | ICD-10-CM

## 2012-06-22 DIAGNOSIS — N946 Dysmenorrhea, unspecified: Secondary | ICD-10-CM

## 2012-06-22 DIAGNOSIS — D259 Leiomyoma of uterus, unspecified: Secondary | ICD-10-CM

## 2012-06-22 DIAGNOSIS — N938 Other specified abnormal uterine and vaginal bleeding: Secondary | ICD-10-CM | POA: Insufficient documentation

## 2012-06-22 DIAGNOSIS — R87619 Unspecified abnormal cytological findings in specimens from cervix uteri: Secondary | ICD-10-CM | POA: Insufficient documentation

## 2012-06-22 HISTORY — PX: ENDOMETRIAL BIOPSY: SHX622

## 2012-06-22 NOTE — Progress Notes (Signed)
Patient ID: Mary [name], female   DOB: [birth year], 42 y.o.   MRN: [id number] 42 yo [id number] with LMP 12/22 and BMI 19 presenting today as an MAU follow-up for evaluation of abnormal uterine bleeding and fibroid uterus. Patient reports normal menses lasting 4-5 days with heavy flow and passage of clots. Patient states having two menses in November and 1 in December. Patient also with dysmenorrhea well controlled with pain medication. Patient was diagnosed with fibroid uterus. Patient is otherwise without complaints and is due for annual pap smear and mammogram.  Past Medical History  Diagnosis Date  . Diabetes mellitus     diagnosed in 1996-always been on insulin  . Kidney stone   . Seizure disorder     started with pregnancy of first son  . Gastritis   . Bipolar 2 disorder   . Post traumatic stress disorder     "flipping out" after people close to her died  . DKA (diabetic ketoacidoses)     Recurrent admissions for DKA, medication non-complaince, poor social situation  . Cirrhosis   . Angina   . Heart murmur   . Shortness of breath     occassionally  . Seizures     pt states 12yrs ago  . Headache     occassionally  . Arthritis     r ankle-S/P surgery 10-29-2005)  . Anxiety   . Peripheral vascular disease     numbness bilaterally feet  . Anemia     childhood  . H/O hiatal hernia     2 yrs ago  . Depression     childhood  . Hep C w/o coma, chronic     biopsy in 2010-no rx-was supposed to see a hepatologist in [place]  . Bipolar disorder   . PTSD (post-traumatic stress disorder)   . Neuropathy     in legs and feet and hands   Past Surgical History  Procedure Date  . Ankle surgery   . Cesarean section    Family History  Problem Relation Age of Onset  . Diabetes Mother     currently 40  . Fibromyalgia Mother   . Cirrhosis Father     died in 10-29-97   History  Substance Use Topics  . Smoking status: Current Every Day Smoker -- 0.1 packs/day for 25 years    Types:  Cigarettes  . Smokeless tobacco: Never Used  . Alcohol Use: No     Comment: Endorses hasn't been drinking   GENERAL: Well-developed, well-nourished female in no acute distress.  HEENT: Normocephalic, atraumatic. Sclerae anicteric.  NECK: Supple. Normal thyroid.  LUNGS: Clear to auscultation bilaterally.  HEART: Regular rate and rhythm. BREASTS: Symmetric in size. No palpable masses or lymphadenopathy, skin changes, or nipple drainage. ABDOMEN: Soft, nontender, nondistended. No organomegaly. PELVIC: Normal external female genitalia. Vagina is pink and rugated.  Normal discharge. Normal appearing cervix. Uterus is normal in size.  No adnexal mass or tenderness. EXTREMITIES: No cyanosis, clubbing, or edema, 2+ distal pulses.  US Pelvis Complete  05/13/2012 *RADIOLOGY REPORT* Clinical Data: Pelvic pain and vaginal bleeding. TRANSABDOMINAL AND TRANSVAGINAL ULTRASOUND OF PELVIS Technique: Both transabdominal and transvaginal ultrasound examinations of the pelvis were performed. Transabdominal technique was performed for global imaging of the pelvis including uterus, ovaries, adnexal regions, and pelvic cul-de-sac. It was necessary to proceed with endovaginal exam following the transabdominal exam to visualize the uterus and ovaries in greater detail. Comparison: Pelvic ultrasound performed 07/06/2009, and CT of the abdomen and pelvis performed 09/12/2011  Findings: Uterus: Normal in size; small 0.9 cm fibroid incidentally noted along the posterior aspect of the fundus. Measures 7.9 x 3.9 x 4.2 cm. Endometrium: Normal in thickness and appearance; measures 0.4 cm in thickness. Right ovary: Measures 3.2 x 2.2 x 1.9 cm. A complex 1.9 cm cystic focus at the right ovary is nonspecific but could reflect a small hemorrhagic cyst. Left ovary: Normal appearance/no adnexal mass; measures 2.6 x 1.8 x 1.4 cm. Other findings: No free fluid seen within the pelvic cul-de-sac. IMPRESSION: 1. Small complex 1.9 cm cystic  focus of the right ovary is nonspecific but could reflect a small hemorrhagic cyst. 2. Small myometrial fibroid noted at the posterior fundus. Original Report Authenticated By: Tonia Ghent, M.D.   A/P 42 yo with dysfunctional uterine bleeding - Pap smear collected - Referral for screening mammography provided - Endometrial biopsy performed ENDOMETRIAL BIOPSY     The indications for endometrial biopsy were reviewed.   Risks of the biopsy including cramping, bleeding, infection, uterine perforation, inadequate specimen and need for additional procedures  were discussed. The patient states she understands and agrees to undergo procedure today. Consent was signed. Time out was performed. Urine HCG was negative. A sterile speculum was placed in the patient's vagina and the cervix was prepped with Betadine. A single-toothed tenaculum was placed on the anterior lip of the cervix to stabilize it. The uterine cavity was sounded to a depth of 7 cm using the uterine sound. The 3 mm pipelle was introduced into the endometrial cavity without difficulty, 2 passes were made.  A  moderate amount of tissue was  sent to pathology. The instruments were removed from the patient's vagina. Minimal bleeding from the cervix was noted. The patient tolerated the procedure well.  Routine post-procedure instructions were given to the patient. The patient will follow up in two weeks to review the results and for further management.   -RTC in 2 weeks for results and further management. Medical management of her DUB and dysmenorrhea discussed with the patient. Patient is requesting Depo-Provera which will be initiated pending biopsy results.

## 2012-06-29 ENCOUNTER — Emergency Department (HOSPITAL_COMMUNITY)
Admission: EM | Admit: 2012-06-29 | Discharge: 2012-06-30 | Disposition: A | Discharge status: Home / Self Care | Payer: Medicaid Other | Attending: Emergency Medicine | Admitting: Emergency Medicine

## 2012-06-29 ENCOUNTER — Encounter (HOSPITAL_COMMUNITY): Payer: Self-pay | Admitting: Emergency Medicine

## 2012-06-29 DIAGNOSIS — Z79899 Other long term (current) drug therapy: Secondary | ICD-10-CM | POA: Diagnosis not present

## 2012-06-29 DIAGNOSIS — IMO0001 Reserved for inherently not codable concepts without codable children: Secondary | ICD-10-CM | POA: Diagnosis not present

## 2012-06-29 DIAGNOSIS — I739 Peripheral vascular disease, unspecified: Secondary | ICD-10-CM | POA: Diagnosis not present

## 2012-06-29 DIAGNOSIS — Z794 Long term (current) use of insulin: Secondary | ICD-10-CM | POA: Diagnosis not present

## 2012-06-29 DIAGNOSIS — G40909 Epilepsy, unspecified, not intractable, without status epilepticus: Secondary | ICD-10-CM | POA: Diagnosis not present

## 2012-06-29 DIAGNOSIS — F411 Generalized anxiety disorder: Secondary | ICD-10-CM | POA: Insufficient documentation

## 2012-06-29 DIAGNOSIS — Z8669 Personal history of other diseases of the nervous system and sense organs: Secondary | ICD-10-CM | POA: Insufficient documentation

## 2012-06-29 DIAGNOSIS — Z87442 Personal history of urinary calculi: Secondary | ICD-10-CM | POA: Diagnosis not present

## 2012-06-29 DIAGNOSIS — R739 Hyperglycemia, unspecified: Secondary | ICD-10-CM

## 2012-06-29 DIAGNOSIS — F17200 Nicotine dependence, unspecified, uncomplicated: Secondary | ICD-10-CM | POA: Insufficient documentation

## 2012-06-29 DIAGNOSIS — Z8739 Personal history of other diseases of the musculoskeletal system and connective tissue: Secondary | ICD-10-CM | POA: Diagnosis not present

## 2012-06-29 DIAGNOSIS — Z8719 Personal history of other diseases of the digestive system: Secondary | ICD-10-CM | POA: Insufficient documentation

## 2012-06-29 DIAGNOSIS — M791 Myalgia, unspecified site: Secondary | ICD-10-CM

## 2012-06-29 DIAGNOSIS — Z7982 Long term (current) use of aspirin: Secondary | ICD-10-CM | POA: Diagnosis not present

## 2012-06-29 DIAGNOSIS — E1169 Type 2 diabetes mellitus with other specified complication: Secondary | ICD-10-CM | POA: Diagnosis present

## 2012-06-29 DIAGNOSIS — R011 Cardiac murmur, unspecified: Secondary | ICD-10-CM | POA: Insufficient documentation

## 2012-06-29 LAB — GLUCOSE, CAPILLARY: Glucose-Capillary: >600 mg/dL (ref 70–99)

## 2012-06-29 LAB — POCT PREGNANCY, URINE: Preg Test, Ur: NEGATIVE

## 2012-06-29 MED ORDER — METRONIDAZOLE 500 MG PO TABS: 500.0000 mg | ORAL_TABLET | Freq: Two times a day (BID) | ORAL | Status: DC

## 2012-06-29 MED ORDER — SODIUM CHLORIDE 0.9 % IV BOLUS (SEPSIS)
1000.0000 mL | Freq: Once | INTRAVENOUS | Status: AC
  Administered 2012-06-29: 1000 mL via INTRAVENOUS

## 2012-06-29 NOTE — ED Notes (Signed)
Per EMS - Pt lives at [facility], called EMS d/t BS registered  >600 also same per EMS. Pt takes insulin at night, 2100 and her level of consciousness has improved since pt took her insulin. Pt states she feels sore all over but has no other c/o pain or illness. # 22 right hand. BP - 118/85, HR - 90, O2 SAT 99% RA. CBG > 600.

## 2012-06-29 NOTE — ED Notes (Signed)
Pt states she just starting hurting all over all of a sudden, took a shower in hopes it would make her feel better which it did not. Pt is alert and continues to c/o stabbing pain all over.

## 2012-06-29 NOTE — Addendum Note (Signed)
Addended by: Catalina Antigua on: 06/29/2012 04:45 PM   Modules accepted: Orders

## 2012-06-29 NOTE — ED Notes (Signed)
WUJ:[id number]<JY> Expected date:06/29/12<BR> Expected time:10:29 PM<BR> Means of arrival:Ambulance<BR> Comments:<BR> Hyperglycemia

## 2012-06-30 ENCOUNTER — Telehealth: Payer: Self-pay | Admitting: *Deleted

## 2012-06-30 LAB — CBC WITH DIFFERENTIAL/PLATELET
Hemoglobin: 12.5 g/dL (ref 12.0–15.0)
Lymphs Abs: 3.6 10*3/uL (ref 0.7–4.0)
Monocytes Relative: 6 % (ref 3–12)
Neutro Abs: 6 10*3/uL (ref 1.7–7.7)
Neutrophils Relative %: 57 % (ref 43–77)
RBC: 5.4 MIL/uL — ABNORMAL HIGH (ref 3.87–5.11)

## 2012-06-30 LAB — URINALYSIS, ROUTINE W REFLEX MICROSCOPIC
Bilirubin Urine: NEGATIVE
Leukocytes, UA: NEGATIVE
Nitrite: NEGATIVE
Specific Gravity, Urine: 1.028 (ref 1.005–1.030)
pH: 6.5 (ref 5.0–8.0)

## 2012-06-30 LAB — COMPREHENSIVE METABOLIC PANEL
Alkaline Phosphatase: 106 U/L (ref 39–117)
BUN: 20 mg/dL (ref 6–23)
Chloride: 97 mEq/L (ref 96–112)
GFR calc Af Amer: >90 mL/min (ref >90)
Glucose, Bld: 618 mg/dL (ref 70–99)
Potassium: 4.8 mEq/L (ref 3.5–5.1)
Total Bilirubin: 0.1 mg/dL — ABNORMAL LOW (ref 0.3–1.2)

## 2012-06-30 LAB — GLUCOSE, CAPILLARY
Glucose-Capillary: 481 mg/dL — ABNORMAL HIGH (ref 70–99)
Glucose-Capillary: 354 mg/dL — ABNORMAL HIGH (ref 70–99)

## 2012-06-30 LAB — POCT I-STAT, CHEM 8
Chloride: 109 mEq/L (ref 96–112)
Glucose, Bld: 613 mg/dL (ref 70–99)
HCT: 36 % (ref 36.0–46.0)
Potassium: 4.6 mEq/L (ref 3.5–5.1)
Sodium: 132 mEq/L — ABNORMAL LOW (ref 135–145)

## 2012-06-30 LAB — BLOOD GAS, ARTERIAL
Bicarbonate: 20.7 mEq/L (ref 20.0–24.0)
TCO2: 19.4 mmol/L (ref 0–100)
pCO2 arterial: 38.5 mmHg (ref 35.0–45.0)
pH, Arterial: 7.35 (ref 7.350–7.450)

## 2012-06-30 LAB — URINE MICROSCOPIC-ADD ON

## 2012-06-30 MED ORDER — SODIUM CHLORIDE 0.9 % IV SOLN
1000.0000 mL | INTRAVENOUS | Status: DC
  Administered 2012-06-30: 1000 mL via INTRAVENOUS

## 2012-06-30 MED ORDER — LORAZEPAM 2 MG/ML IJ SOLN
1.0000 mg | Freq: Once | INTRAMUSCULAR | Status: AC
  Administered 2012-06-30: 1 mg via INTRAVENOUS
  Filled 2012-06-30: qty 1

## 2012-06-30 MED ORDER — SODIUM CHLORIDE 0.9 % IV BOLUS (SEPSIS)
1000.0000 mL | Freq: Once | INTRAVENOUS | Status: AC
  Administered 2012-06-30: 1000 mL via INTRAVENOUS

## 2012-06-30 MED ORDER — FENTANYL CITRATE 0.05 MG/ML IJ SOLN
25.0000 ug | Freq: Once | INTRAMUSCULAR | Status: AC
  Administered 2012-06-30: 25 ug via INTRAVENOUS
  Filled 2012-06-30: qty 2

## 2012-06-30 MED ORDER — LORAZEPAM 2 MG/ML IJ SOLN: 1.0000 mg | Freq: Once | INTRAMUSCULAR | Status: DC

## 2012-06-30 MED ORDER — INSULIN ASPART 100 UNIT/ML ~~LOC~~ SOLN
15.0000 [IU] | Freq: Once | SUBCUTANEOUS | Status: AC
  Administered 2012-06-30: 15 [IU] via SUBCUTANEOUS
  Filled 2012-06-30: qty 1

## 2012-06-30 MED ORDER — SODIUM CHLORIDE 0.9 % IV SOLN: 1000.0000 mL | Freq: Once | INTRAVENOUS | Status: DC

## 2012-06-30 MED ORDER — INSULIN REGULAR HUMAN 100 UNIT/ML IJ SOLN
INTRAMUSCULAR | Status: DC
  Administered 2012-06-30: 4.2 [IU]/h via INTRAVENOUS
  Administered 2012-06-30: 2.9 [IU]/h via INTRAVENOUS
  Administered 2012-06-30: 1.7 [IU]/h via INTRAVENOUS
  Filled 2012-06-30: qty 1

## 2012-06-30 NOTE — Telephone Encounter (Signed)
Called patient and left message for her to return my call. 

## 2012-06-30 NOTE — ED Notes (Signed)
No bus passes in ED. Verified w/ AC that he had no bus passes either. Pt informed and pushed to lobby in wheelchair.

## 2012-06-30 NOTE — ED Provider Notes (Signed)
History     CSN: [id number]  Arrival date & time 06/29/12  2235/10/31   First MD Initiated Contact with Patient 06/30/12 0029      Chief Complaint  Patient presents with  . Hyperglycemia    (Consider location/radiation/quality/duration/timing/severity/associated sxs/prior treatment) HPI 42 year old female presents to emergency department via EMS complaining of generalized body pain, and elevated blood sugar. Patient is a diabetic, has history of DKA. She reports that she has been taking her metformin and evening insulin dosing. Of note, patient was seen by me last month for similar presentation, and has not yet followed up with her primary care Dr. She denies any fever chills. No diarrhea. She has been eating and drinking well.  Past Medical History  Diagnosis Date  . Diabetes mellitus     diagnosed in 1996-always been on insulin  . Kidney stone   . Seizure disorder     started with pregnancy of first son  . Gastritis   . Bipolar 2 disorder   . Post traumatic stress disorder     "flipping out" after people close to her died  . DKA (diabetic ketoacidoses)     Recurrent admissions for DKA, medication non-complaince, poor social situation  . Cirrhosis   . Angina   . Heart murmur   . Shortness of breath     occassionally  . Seizures     pt states 22yrs ago  . Headache     occassionally  . Arthritis     r ankle-S/P surgery Oct 30, 2005)  . Anxiety   . Peripheral vascular disease     numbness bilaterally feet  . Anemia     childhood  . H/O hiatal hernia     2 yrs ago  . Depression     childhood  . Hep C w/o coma, chronic     biopsy in 2010-no rx-was supposed to see a hepatologist in [place]  . Bipolar disorder   . PTSD (post-traumatic stress disorder)   . Neuropathy     in legs and feet and hands    Past Surgical History  Procedure Date  . Ankle surgery   . Cesarean section   . Endometrial biopsy 06/22/2012    Family History  Problem Relation Age of Onset  .  Diabetes Mother     currently 42  . Fibromyalgia Mother   . Cirrhosis Father     died in 1997-10-30    History  Substance Use Topics  . Smoking status: Current Every Day Smoker -- 0.1 packs/day for 25 years    Types: Cigarettes  . Smokeless tobacco: Never Used  . Alcohol Use: No     Comment: Endorses hasn't been drinking    OB History    Grav Para Term Preterm Abortions TAB SAB Ect Mult Living   4 3 3  0 1 0 1 0 1 2      Review of Systems  See History of Present Illness; otherwise all other systems are reviewed and negative  Allergies  Penicillins  Home Medications   Current Outpatient Rx  Name  Route  Sig  Dispense  Refill  . ASPIRIN EC 81 MG PO TBEC   Oral   Take 81 mg by mouth daily.          Marland Kitchen CALCIUM CARBONATE ANTACID 500 MG PO CHEW   Oral   Chew 2 tablets by mouth daily as needed. For heartburn         . GABAPENTIN 300 MG PO CAPS  Oral   Take 1 capsule (300 mg total) by mouth 2 (two) times daily.   62 capsule   0   . INSULIN GLARGINE 100 UNIT/ML [place] SOLN   Subcutaneous   Inject 15 Units into the skin at bedtime.         Marland Kitchen METFORMIN HCL 500 MG PO TABS   Oral   Take 500 mg by mouth at bedtime.         . OXYCODONE HCL 5 MG PO CAPS   Oral   Take 5 mg by mouth every 4 (four) hours as needed. For pain         . OXYCODONE-ACETAMINOPHEN 5-325 MG PO TABS   Oral   Take 1 tablet by mouth every 4 (four) hours as needed for pain.   10 tablet   0   . RANITIDINE HCL 150 MG PO CAPS   Oral   Take 150 mg by mouth 2 (two) times daily as needed. For heartburn         . METRONIDAZOLE 500 MG PO TABS   Oral   Take 1 tablet (500 mg total) by mouth 2 (two) times daily.   14 tablet   0     BP 122/73  Pulse 92  Temp 98.6 F (37 C) (Oral)  Resp 20  SpO2 99%  LMP 06/07/2012  Breastfeeding? Unknown  Physical Exam  Nursing note and vitals reviewed. Constitutional: She is oriented to person, place, and time. She appears well-developed and  well-nourished. No distress.  HENT:  Head: Normocephalic and atraumatic.  Right Ear: External ear normal.  Left Ear: External ear normal.  Nose: Nose normal.  Mouth/Throat: Oropharynx is clear and moist.       Dry mucous membranes  Eyes: Conjunctivae normal and EOM are normal. Pupils are equal, round, and reactive to light.  Neck: Normal range of motion. Neck supple. No JVD present. No tracheal deviation present. No thyromegaly present.  Cardiovascular: Normal rate, regular rhythm, normal heart sounds and intact distal pulses.  Exam reveals no gallop and no friction rub.   No murmur heard. Pulmonary/Chest: Effort normal and breath sounds normal. No stridor. No respiratory distress. She has no wheezes. She has no rales. She exhibits no tenderness.  Abdominal: Soft. Bowel sounds are normal. She exhibits no distension and no mass. There is tenderness (diffuse abdominal pain). There is no rebound and no guarding.  Musculoskeletal: Normal range of motion. She exhibits tenderness (diffuse musculoskeletal pain to palpation throughout). She exhibits no edema.  Lymphadenopathy:    She has no cervical adenopathy.  Neurological: She is oriented to person, place, and time. She has normal reflexes. No cranial nerve deficit. She exhibits normal muscle tone. Coordination normal.  Skin: Skin is dry. No rash noted. She is not diaphoretic. No erythema. No pallor.  Psychiatric: She has a normal mood and affect. Her behavior is normal. Judgment and thought content normal.    ED Course  Procedures (including critical care time)  Labs Reviewed  GLUCOSE, CAPILLARY - Abnormal; Notable for the following:    Glucose-Capillary >600 (*)     All other components within normal limits  URINALYSIS, ROUTINE W REFLEX MICROSCOPIC - Abnormal; Notable for the following:    Glucose, UA >1000 (*)     All other components within normal limits  CBC WITH DIFFERENTIAL - Abnormal; Notable for the following:    WBC 10.6 (*)       RBC 5.40 (*)     MCV 70.6 (*)  MCH 23.1 (*)     All other components within normal limits  COMPREHENSIVE METABOLIC PANEL - Abnormal; Notable for the following:    Sodium 131 (*)     Glucose, Bld 618 (*)     ALT 36 (*)     Total Bilirubin 0.1 (*)     All other components within normal limits  LIPASE, BLOOD - Abnormal; Notable for the following:    Lipase 86 (*)     All other components within normal limits  BLOOD GAS, ARTERIAL - Abnormal; Notable for the following:    pO2, Arterial 67.7 (*)     Acid-base deficit 3.9 (*)     All other components within normal limits  GLUCOSE, CAPILLARY - Abnormal; Notable for the following:    Glucose-Capillary 481 (*)     All other components within normal limits  GLUCOSE, CAPILLARY - Abnormal; Notable for the following:    Glucose-Capillary 354 (*)     All other components within normal limits  GLUCOSE, CAPILLARY - Abnormal; Notable for the following:    Glucose-Capillary 234 (*)     All other components within normal limits  POCT PREGNANCY, URINE  URINE MICROSCOPIC-ADD ON  KETONES, QUALITATIVE   No results found.   1. Hyperglycemia without ketosis   2. Myalgia       MDM  42 year old female with hyperglycemia without ketosis. She does not have DKA. She's been given IV fluids and insulin. She has no Anion gap. We'll refer her back to her primary care physician        Olivia Mackie, MD 06/30/12 [phone]

## 2012-06-30 NOTE — ED Notes (Signed)
MD Norlene Campbell made aware of pts glucose level of 613

## 2012-06-30 NOTE — ED Notes (Signed)
Pt continues to request pain medication, spoke with Dr. Norlene Campbell earlier regarding same and she does not want to medicate at this time d/t pt's being altered earlier. Pt now screaming and demanding to see the doctor, "I ain't ever been here and not been given pain medicine, if I continue to hurt I will get more evil and make a complaint to someone".

## 2012-06-30 NOTE — Telephone Encounter (Signed)
Message copied by Mannie Stabile on Tue Jun 30, 2012 11:00 AM ------      Message from: CONSTANT, PEGGY      Created: Mon Jun 29, 2012  4:45 PM       Please inform patient of trichomonas seen on pap smear. Flagyl e-prescribed. Advise patient to abstain from intercourse until treatment completion and advise to have partner treated.            Thanks      [employer]

## 2012-07-01 ENCOUNTER — Telehealth: Payer: Self-pay | Admitting: *Deleted

## 2012-07-01 NOTE — Telephone Encounter (Signed)
Called [place] and left a message we are calling her back to answer her questions and give her information that we were trying to reach her for- please call clinic and if there is a better number or time to reach you- leave that information.

## 2012-07-01 NOTE — Telephone Encounter (Signed)
Pr left message requesting results of biopsy and mammogram.

## 2012-07-01 NOTE — Telephone Encounter (Signed)
Day, Drucilla Schmidt RN 07/01/2012 8:53 AM Signed  Pr left message requesting results of biopsy and mammogram

## 2012-07-02 NOTE — Telephone Encounter (Signed)
Sent certified letter to pt.

## 2012-07-02 NOTE — Telephone Encounter (Signed)
Called pt and left message to return the call to the clinics.  

## 2012-07-09 ENCOUNTER — Ambulatory Visit: Payer: Self-pay | Admitting: Obstetrics and Gynecology

## 2012-07-21 ENCOUNTER — Other Ambulatory Visit: Payer: Self-pay | Admitting: Obstetrics and Gynecology

## 2012-07-21 DIAGNOSIS — Z1231 Encounter for screening mammogram for malignant neoplasm of breast: Secondary | ICD-10-CM

## 2012-07-29 ENCOUNTER — Ambulatory Visit: Payer: Self-pay | Admitting: Obstetrics and Gynecology

## 2012-07-30 ENCOUNTER — Ambulatory Visit (HOSPITAL_COMMUNITY): Payer: Self-pay

## 2012-08-12 ENCOUNTER — Ambulatory Visit (HOSPITAL_COMMUNITY)
Admission: RE | Admit: 2012-08-12 | Discharge: 2012-08-12 | Disposition: A | Discharge status: Home / Self Care | Payer: Self-pay | Source: Ambulatory Visit | Attending: Obstetrics and Gynecology | Admitting: Obstetrics and Gynecology

## 2012-08-12 DIAGNOSIS — Z1231 Encounter for screening mammogram for malignant neoplasm of breast: Secondary | ICD-10-CM

## 2012-09-29 ENCOUNTER — Emergency Department (HOSPITAL_COMMUNITY): Payer: Medicaid Other

## 2012-09-29 ENCOUNTER — Encounter (HOSPITAL_COMMUNITY): Payer: Self-pay | Admitting: Emergency Medicine

## 2012-09-29 ENCOUNTER — Emergency Department (HOSPITAL_COMMUNITY)
Admission: EM | Admit: 2012-09-29 | Discharge: 2012-09-30 | Disposition: A | Discharge status: Home / Self Care | Payer: Medicaid Other | Attending: Emergency Medicine | Admitting: Emergency Medicine

## 2012-09-29 DIAGNOSIS — E1169 Type 2 diabetes mellitus with other specified complication: Secondary | ICD-10-CM | POA: Diagnosis present

## 2012-09-29 DIAGNOSIS — Z8659 Personal history of other mental and behavioral disorders: Secondary | ICD-10-CM | POA: Diagnosis not present

## 2012-09-29 DIAGNOSIS — IMO0001 Reserved for inherently not codable concepts without codable children: Secondary | ICD-10-CM | POA: Insufficient documentation

## 2012-09-29 DIAGNOSIS — Z8679 Personal history of other diseases of the circulatory system: Secondary | ICD-10-CM | POA: Insufficient documentation

## 2012-09-29 DIAGNOSIS — Z8669 Personal history of other diseases of the nervous system and sense organs: Secondary | ICD-10-CM | POA: Diagnosis not present

## 2012-09-29 DIAGNOSIS — R5381 Other malaise: Secondary | ICD-10-CM | POA: Diagnosis not present

## 2012-09-29 DIAGNOSIS — F319 Bipolar disorder, unspecified: Secondary | ICD-10-CM | POA: Diagnosis not present

## 2012-09-29 DIAGNOSIS — Z7982 Long term (current) use of aspirin: Secondary | ICD-10-CM | POA: Diagnosis not present

## 2012-09-29 DIAGNOSIS — H538 Other visual disturbances: Secondary | ICD-10-CM | POA: Insufficient documentation

## 2012-09-29 DIAGNOSIS — F17200 Nicotine dependence, unspecified, uncomplicated: Secondary | ICD-10-CM | POA: Insufficient documentation

## 2012-09-29 DIAGNOSIS — R300 Dysuria: Secondary | ICD-10-CM | POA: Diagnosis not present

## 2012-09-29 DIAGNOSIS — R739 Hyperglycemia, unspecified: Secondary | ICD-10-CM

## 2012-09-29 DIAGNOSIS — E1110 Type 2 diabetes mellitus with ketoacidosis without coma: Secondary | ICD-10-CM | POA: Diagnosis not present

## 2012-09-29 DIAGNOSIS — Z8719 Personal history of other diseases of the digestive system: Secondary | ICD-10-CM | POA: Insufficient documentation

## 2012-09-29 DIAGNOSIS — R059 Cough, unspecified: Secondary | ICD-10-CM | POA: Insufficient documentation

## 2012-09-29 DIAGNOSIS — Z87442 Personal history of urinary calculi: Secondary | ICD-10-CM | POA: Insufficient documentation

## 2012-09-29 DIAGNOSIS — Z794 Long term (current) use of insulin: Secondary | ICD-10-CM | POA: Diagnosis not present

## 2012-09-29 DIAGNOSIS — Z862 Personal history of diseases of the blood and blood-forming organs and certain disorders involving the immune mechanism: Secondary | ICD-10-CM | POA: Diagnosis not present

## 2012-09-29 DIAGNOSIS — R0602 Shortness of breath: Secondary | ICD-10-CM | POA: Diagnosis not present

## 2012-09-29 DIAGNOSIS — Z8739 Personal history of other diseases of the musculoskeletal system and connective tissue: Secondary | ICD-10-CM | POA: Insufficient documentation

## 2012-09-29 DIAGNOSIS — Z79899 Other long term (current) drug therapy: Secondary | ICD-10-CM | POA: Diagnosis not present

## 2012-09-29 DIAGNOSIS — Z8619 Personal history of other infectious and parasitic diseases: Secondary | ICD-10-CM | POA: Diagnosis not present

## 2012-09-29 DIAGNOSIS — R011 Cardiac murmur, unspecified: Secondary | ICD-10-CM | POA: Diagnosis not present

## 2012-09-29 DIAGNOSIS — R1084 Generalized abdominal pain: Secondary | ICD-10-CM | POA: Insufficient documentation

## 2012-09-29 DIAGNOSIS — R05 Cough: Secondary | ICD-10-CM | POA: Diagnosis not present

## 2012-09-29 LAB — COMPREHENSIVE METABOLIC PANEL
ALT: 58 U/L — ABNORMAL HIGH (ref 0–35)
AST: 50 U/L — ABNORMAL HIGH (ref 0–37)
CO2: 23 mEq/L (ref 19–32)
Chloride: 89 mEq/L — ABNORMAL LOW (ref 96–112)
Creatinine, Ser: 0.65 mg/dL (ref 0.50–1.10)
GFR calc non Af Amer: >90 mL/min (ref >90)
Glucose, Bld: 581 mg/dL (ref 70–99)
Total Bilirubin: 0.3 mg/dL (ref 0.3–1.2)

## 2012-09-29 LAB — CBC WITH DIFFERENTIAL/PLATELET
Eosinophils Absolute: 0.3 10*3/uL (ref 0.0–0.7)
Lymphocytes Relative: 46 % (ref 12–46)
Lymphs Abs: 4.8 10*3/uL — ABNORMAL HIGH (ref 0.7–4.0)
Neutrophils Relative %: 45 % (ref 43–77)
Platelets: 164 10*3/uL (ref 150–400)
RBC: 4.92 MIL/uL (ref 3.87–5.11)
WBC: 10.4 10*3/uL (ref 4.0–10.5)

## 2012-09-29 LAB — GLUCOSE, CAPILLARY: Glucose-Capillary: 383 mg/dL — ABNORMAL HIGH (ref 70–99)

## 2012-09-29 LAB — URINALYSIS, ROUTINE W REFLEX MICROSCOPIC
Bilirubin Urine: NEGATIVE
Hgb urine dipstick: NEGATIVE
Protein, ur: NEGATIVE mg/dL
Urobilinogen, UA: 0.2 mg/dL (ref 0.0–1.0)

## 2012-09-29 LAB — BLOOD GAS, VENOUS
Acid-base deficit: 1.2 mmol/L (ref 0.0–2.0)
O2 Saturation: 32.4 %
pCO2, Ven: 50.4 mmHg — ABNORMAL HIGH (ref 45.0–50.0)

## 2012-09-29 LAB — URINE MICROSCOPIC-ADD ON

## 2012-09-29 MED ORDER — SODIUM CHLORIDE 0.9 % IV BOLUS (SEPSIS)
1000.0000 mL | Freq: Once | INTRAVENOUS | Status: AC
  Administered 2012-09-29: 1000 mL via INTRAVENOUS

## 2012-09-29 MED ORDER — INSULIN ASPART 100 UNIT/ML ~~LOC~~ SOLN
5.0000 [IU] | Freq: Once | SUBCUTANEOUS | Status: AC
  Administered 2012-09-29: 5 [IU] via SUBCUTANEOUS

## 2012-09-29 NOTE — ED Provider Notes (Signed)
History     CSN: [id number]  Arrival date & time 09/29/12  1646   First MD Initiated Contact with Patient 09/29/12 1654      Chief Complaint  Patient presents with  . Hyperglycemia    (Consider location/radiation/quality/duration/timing/severity/associated sxs/prior treatment) HPI 42 yo F with DM (h/o DKA) presenting with hyperglycemia for the last 4 days.  States that since Oct 29, 2022, her CBGs at home have been >600 (previously in the 200-400s).  Also reports fatigue, muscle aches, nasal congestion, frontal headache, "feeling out of it," blurry vision, diffuse abdominal pain, cough, dysuria x4 days.  No nausea, vomiting, fevers, chills, diarrhea.  Saw PCP 2 weeks ago and he increased her Lantus to 30 units qHS.  She reports compliance with this dose change and her metformin, but thinks she has probably missed 1-2 doses in the last week.    Past Medical History  Diagnosis Date  . Diabetes mellitus     diagnosed in 1996-always been on insulin  . Kidney stone   . Seizure disorder     started with pregnancy of first son  . Gastritis   . Bipolar 2 disorder   . Post traumatic stress disorder     "flipping out" after people close to her died  . DKA (diabetic ketoacidoses)     Recurrent admissions for DKA, medication non-complaince, poor social situation  . Cirrhosis   . Angina   . Heart murmur   . Shortness of breath     occassionally  . Seizures     pt states 36yrs ago  . Headache     occassionally  . Arthritis     r ankle-S/P surgery Oct 28, 2005)  . Anxiety   . Peripheral vascular disease     numbness bilaterally feet  . Anemia     childhood  . H/O hiatal hernia     2 yrs ago  . Depression     childhood  . Hep C w/o coma, chronic     biopsy in 2010-no rx-was supposed to see a hepatologist in [place]  . Bipolar disorder   . PTSD (post-traumatic stress disorder)   . Neuropathy     in legs and feet and hands    Past Surgical History  Procedure Laterality Date  .  Ankle surgery    . Cesarean section    . Endometrial biopsy  06/22/2012    Family History  Problem Relation Age of Onset  . Diabetes Mother     currently 51  . Fibromyalgia Mother   . Cirrhosis Father     died in 10-28-97    History  Substance Use Topics  . Smoking status: Current Every Day Smoker -- 0.10 packs/day for 25 years    Types: Cigarettes  . Smokeless tobacco: Never Used  . Alcohol Use: No     Comment: Endorses hasn't been drinking    OB History   Grav Para Term Preterm Abortions TAB SAB Ect Mult Living   4 3 3  0 1 0 1 0 1 2      Review of Systems  Constitutional: Positive for fatigue. Negative for fever and chills.  HENT: Positive for congestion and sinus pressure. Negative for sore throat and neck stiffness.   Eyes: Positive for visual disturbance (blurry vision).  Respiratory: Positive for cough and shortness of breath. Negative for wheezing.   Cardiovascular: Negative for chest pain.  Gastrointestinal: Positive for abdominal pain. Negative for nausea, vomiting, diarrhea and constipation.  Genitourinary: Positive for dysuria.  Musculoskeletal: Positive for myalgias.  Skin: Negative for rash.  Neurological: Negative for dizziness and weakness.  Psychiatric/Behavioral: Negative.     Allergies  Penicillins  Home Medications   Current Outpatient Rx  Name  Route  Sig  Dispense  Refill  . aspirin EC 81 MG tablet   Oral   Take 81 mg by mouth daily.          . calcium carbonate (TUMS - DOSED IN MG ELEMENTAL CALCIUM) 500 MG chewable tablet   Oral   Chew 2 tablets by mouth daily as needed. For heartburn         . gabapentin (NEURONTIN) 300 MG capsule   Oral   Take 1 capsule (300 mg total) by mouth 2 (two) times daily.   62 capsule   0   . insulin glargine (LANTUS) 100 UNIT/ML injection   Subcutaneous   Inject 30 Units into the skin at bedtime.          . metFORMIN (GLUCOPHAGE) 500 MG tablet   Oral   Take 500 mg by mouth at bedtime.          . ranitidine (ZANTAC) 150 MG capsule   Oral   Take 150 mg by mouth 2 (two) times daily as needed. For heartburn         . risperidone (RISPERDAL) 4 MG tablet   Oral   Take 4 mg by mouth daily.         . traZODone (DESYREL) 150 MG tablet   Oral   Take 150 mg by mouth at bedtime.           BP 109/75  Pulse 106  Temp(Src) 97.7 F (36.5 C)  Resp 16  SpO2 97%  LMP 09/03/2012  Physical Exam  Constitutional: She is oriented to person, place, and time. She appears well-developed and well-nourished. No distress.  HENT:  Head: Normocephalic and atraumatic.  Right Ear: External ear normal.  Left Ear: External ear normal.  Mouth/Throat: No oropharyngeal exudate.  Slightly dry mucous membranes  Eyes: Conjunctivae and EOM are normal. Pupils are equal, round, and reactive to light. Right eye exhibits no discharge. Left eye exhibits no discharge. No scleral icterus.  Neck: Normal range of motion. Neck supple. No JVD present.  Cardiovascular: Normal rate, regular rhythm, normal heart sounds and intact distal pulses.   No murmur heard. Pulmonary/Chest: Effort normal and breath sounds normal. No respiratory distress. She has no wheezes. She has no rales.  No Kussmal breathing.    Abdominal: Soft. Bowel sounds are normal. She exhibits no distension and no mass. There is tenderness (mild, diffuse). There is no rebound and no guarding.  Musculoskeletal: She exhibits no edema and no tenderness.  Lymphadenopathy:    She has no cervical adenopathy.  Neurological: She is alert and oriented to person, place, and time. She exhibits normal muscle tone. Coordination normal.  Skin: Skin is warm and dry. No rash noted. She is not diaphoretic. No erythema.  Psychiatric: She has a normal mood and affect. Thought content normal.    ED Course  Procedures (including critical care time)  Labs Reviewed  COMPREHENSIVE METABOLIC PANEL - Abnormal; Notable for the following:    Sodium 130 (*)     Chloride 89 (*)    Glucose, Bld 581 (*)    Total Protein 8.5 (*)    AST 50 (*)    ALT 58 (*)    All other components within normal limits  BLOOD GAS, VENOUS -  Abnormal; Notable for the following:    pH, Ven 7.316 (*)    pCO2, Ven 50.4 (*)    pO2, Ven 21.3 (*)    Bicarbonate 25.0 (*)    All other components within normal limits  URINALYSIS, ROUTINE W REFLEX MICROSCOPIC - Abnormal; Notable for the following:    Specific Gravity, Urine 1.036 (*)    Glucose, UA >1000 (*)    Ketones, ur 40 (*)    Leukocytes, UA TRACE (*)    All other components within normal limits  GLUCOSE, CAPILLARY - Abnormal; Notable for the following:    Glucose-Capillary 597 (*)    All other components within normal limits  URINE MICROSCOPIC-ADD ON - Abnormal; Notable for the following:    Squamous Epithelial / LPF FEW (*)    Bacteria, UA FEW (*)    All other components within normal limits  CBC WITH DIFFERENTIAL - Abnormal; Notable for the following:    Hemoglobin 11.3 (*)    HCT 35.2 (*)    MCV 71.5 (*)    MCH 23.0 (*)    Lymphs Abs 4.8 (*)    All other components within normal limits  GLUCOSE, CAPILLARY - Abnormal; Notable for the following:    Glucose-Capillary 383 (*)    All other components within normal limits  GLUCOSE, CAPILLARY - Abnormal; Notable for the following:    Glucose-Capillary 312 (*)    All other components within normal limits  CBC WITH DIFFERENTIAL   Dg Chest 2 View  09/29/2012  *RADIOLOGY REPORT*  Clinical Data: Hyperglycemia.  CHEST - 2 VIEW  Comparison: Chest x-ray dated 07/28/2011 and 03/13/2011  Findings: Heart size and vascularity are normal and the lungs are clear except for emphysematous blebs at the lung apices.  Moderate thoracolumbar scoliosis, unchanged.  IMPRESSION: No acute disease.  Emphysematous changes at the apices.   Original Report Authenticated By: Francene Boyers, M.D.      No diagnosis found.    MDM  42 yo F with DM presenting with hyperglycemia.  Has been  seen multiple times for this in the past year.  Recently saw PCP who increased Lantus dose to 30 units, reports 1-2 missed doses in the last week.  Hyperglycemia may also be related to an infection given multiple other concerns.  Will check cxr, ua, cbc, cmp, vbg.  Will give 1L NS bolus.  Monitor CBGs q2hr.  10:30 PM: CBG at 383.  Patient feeling better.  Wants something to eat.  2nd liter bolus running.  Will allow her to eat and give 5 units novolog SQ with food.  Non acidotic on VBG.    11:00 PM: patient feeling better.  All labs reviewed.  Will finish 3rd bolus and recheck CBG.  If <400, will discharge.  Discussed plan with patient.    11:53 PM: CBG 254.  Will discharge.  Phebe Colla, MD 09/29/12 [phone]

## 2012-09-29 NOTE — ED Notes (Signed)
Pt c/o of hyperglycemia associated with thirst, frequent urination, and headache x4 days. States that BS is reading over 600. NAD at this time.

## 2012-09-29 NOTE — ED Notes (Addendum)
Pt CBG is a critical value of 597, RN Taquita notified.

## 2012-09-29 NOTE — ED Notes (Signed)
IV attempt x2. IV team paged 

## 2012-09-30 NOTE — ED Provider Notes (Signed)
I saw and evaluated the patient, reviewed the resident's note and I agree with the findings and plan.   .Face to face Exam:  General:  Awake HEENT:  Atraumatic Resp:  Normal effort Abd:  Nondistended Neuro:No focal weakness Lymph: No adenopathy  Nelia Shi, MD 09/30/12 1036

## 2012-10-20 ENCOUNTER — Encounter (HOSPITAL_COMMUNITY): Payer: Self-pay | Admitting: Emergency Medicine

## 2012-10-20 ENCOUNTER — Emergency Department (HOSPITAL_COMMUNITY): Payer: Medicaid Other

## 2012-10-20 ENCOUNTER — Inpatient Hospital Stay (HOSPITAL_COMMUNITY)
Admission: EM | Admit: 2012-10-20 | Discharge: 2012-10-22 | DRG: 638 | Disposition: A | Discharge status: Home / Self Care | Payer: Medicaid Other | Attending: Internal Medicine | Admitting: Internal Medicine

## 2012-10-20 DIAGNOSIS — D649 Anemia, unspecified: Secondary | ICD-10-CM | POA: Diagnosis present

## 2012-10-20 DIAGNOSIS — Z720 Tobacco use: Secondary | ICD-10-CM

## 2012-10-20 DIAGNOSIS — F4310 Post-traumatic stress disorder, unspecified: Secondary | ICD-10-CM | POA: Diagnosis present

## 2012-10-20 DIAGNOSIS — Z794 Long term (current) use of insulin: Secondary | ICD-10-CM | POA: Diagnosis not present

## 2012-10-20 DIAGNOSIS — Z87442 Personal history of urinary calculi: Secondary | ICD-10-CM

## 2012-10-20 DIAGNOSIS — R109 Unspecified abdominal pain: Secondary | ICD-10-CM | POA: Diagnosis not present

## 2012-10-20 DIAGNOSIS — A599 Trichomoniasis, unspecified: Secondary | ICD-10-CM

## 2012-10-20 DIAGNOSIS — F319 Bipolar disorder, unspecified: Secondary | ICD-10-CM

## 2012-10-20 DIAGNOSIS — D509 Iron deficiency anemia, unspecified: Secondary | ICD-10-CM

## 2012-10-20 DIAGNOSIS — E1049 Type 1 diabetes mellitus with other diabetic neurological complication: Secondary | ICD-10-CM | POA: Diagnosis present

## 2012-10-20 DIAGNOSIS — E1065 Type 1 diabetes mellitus with hyperglycemia: Secondary | ICD-10-CM | POA: Diagnosis present

## 2012-10-20 DIAGNOSIS — E871 Hypo-osmolality and hyponatremia: Secondary | ICD-10-CM | POA: Diagnosis present

## 2012-10-20 DIAGNOSIS — G8929 Other chronic pain: Secondary | ICD-10-CM | POA: Diagnosis present

## 2012-10-20 DIAGNOSIS — Z9119 Patient's noncompliance with other medical treatment and regimen: Secondary | ICD-10-CM | POA: Diagnosis not present

## 2012-10-20 DIAGNOSIS — R112 Nausea with vomiting, unspecified: Secondary | ICD-10-CM

## 2012-10-20 DIAGNOSIS — E1142 Type 2 diabetes mellitus with diabetic polyneuropathy: Secondary | ICD-10-CM | POA: Diagnosis present

## 2012-10-20 DIAGNOSIS — Z79899 Other long term (current) drug therapy: Secondary | ICD-10-CM

## 2012-10-20 DIAGNOSIS — E876 Hypokalemia: Secondary | ICD-10-CM

## 2012-10-20 DIAGNOSIS — E1010 Type 1 diabetes mellitus with ketoacidosis without coma: Secondary | ICD-10-CM | POA: Diagnosis not present

## 2012-10-20 DIAGNOSIS — B182 Chronic viral hepatitis C: Secondary | ICD-10-CM | POA: Diagnosis present

## 2012-10-20 DIAGNOSIS — D72829 Elevated white blood cell count, unspecified: Secondary | ICD-10-CM

## 2012-10-20 DIAGNOSIS — F3189 Other bipolar disorder: Secondary | ICD-10-CM | POA: Diagnosis not present

## 2012-10-20 DIAGNOSIS — N946 Dysmenorrhea, unspecified: Secondary | ICD-10-CM

## 2012-10-20 DIAGNOSIS — G40909 Epilepsy, unspecified, not intractable, without status epilepticus: Secondary | ICD-10-CM | POA: Diagnosis present

## 2012-10-20 DIAGNOSIS — IMO0002 Reserved for concepts with insufficient information to code with codable children: Secondary | ICD-10-CM

## 2012-10-20 DIAGNOSIS — K7460 Unspecified cirrhosis of liver: Secondary | ICD-10-CM | POA: Diagnosis present

## 2012-10-20 DIAGNOSIS — R7989 Other specified abnormal findings of blood chemistry: Secondary | ICD-10-CM | POA: Diagnosis present

## 2012-10-20 DIAGNOSIS — N938 Other specified abnormal uterine and vaginal bleeding: Secondary | ICD-10-CM

## 2012-10-20 DIAGNOSIS — F3181 Bipolar II disorder: Secondary | ICD-10-CM

## 2012-10-20 DIAGNOSIS — D259 Leiomyoma of uterus, unspecified: Secondary | ICD-10-CM

## 2012-10-20 DIAGNOSIS — I739 Peripheral vascular disease, unspecified: Secondary | ICD-10-CM | POA: Diagnosis present

## 2012-10-20 DIAGNOSIS — Z91199 Patient's noncompliance with other medical treatment and regimen due to unspecified reason: Secondary | ICD-10-CM

## 2012-10-20 DIAGNOSIS — Z9114 Patient's other noncompliance with medication regimen: Secondary | ICD-10-CM

## 2012-10-20 DIAGNOSIS — Z91148 Patient's other noncompliance with medication regimen for other reason: Secondary | ICD-10-CM

## 2012-10-20 DIAGNOSIS — E860 Dehydration: Secondary | ICD-10-CM

## 2012-10-20 DIAGNOSIS — N183 Chronic kidney disease, stage 3 unspecified: Secondary | ICD-10-CM

## 2012-10-20 LAB — CBC WITH DIFFERENTIAL/PLATELET
Basophils Absolute: 0 10*3/uL (ref 0.0–0.1)
Basophils Relative: 0 % (ref 0–1)
Eosinophils Absolute: 0.3 10*3/uL (ref 0.0–0.7)
Hemoglobin: 13.1 g/dL (ref 12.0–15.0)
Lymphocytes Relative: 36 % (ref 12–46)
MCHC: 31.7 g/dL (ref 30.0–36.0)
Monocytes Relative: 5 % (ref 3–12)
Neutrophils Relative %: 56 % (ref 43–77)
WBC: 8.3 10*3/uL (ref 4.0–10.5)

## 2012-10-20 LAB — CBC
HCT: 36.5 % (ref 36.0–46.0)
MCH: 22.6 pg — ABNORMAL LOW (ref 26.0–34.0)
MCHC: 32.1 g/dL (ref 30.0–36.0)
RDW: 13.7 % (ref 11.5–15.5)

## 2012-10-20 LAB — BASIC METABOLIC PANEL
BUN: 10 mg/dL (ref 6–23)
BUN: 10 mg/dL (ref 6–23)
Calcium: 9 mg/dL (ref 8.4–10.5)
Calcium: 8.5 mg/dL (ref 8.4–10.5)
Chloride: 104 mEq/L (ref 96–112)
Creatinine, Ser: 0.41 mg/dL — ABNORMAL LOW (ref 0.50–1.10)
Creatinine, Ser: 0.4 mg/dL — ABNORMAL LOW (ref 0.50–1.10)
GFR calc Af Amer: >90 mL/min (ref >90)
GFR calc Af Amer: >90 mL/min (ref >90)
GFR calc non Af Amer: >90 mL/min (ref >90)
GFR calc non Af Amer: >90 mL/min (ref >90)
Potassium: 3.3 mEq/L — ABNORMAL LOW (ref 3.5–5.1)

## 2012-10-20 LAB — KETONES, QUALITATIVE

## 2012-10-20 LAB — TROPONIN I: Troponin I: <0.3 ng/mL (ref <0.30)

## 2012-10-20 LAB — GLUCOSE, CAPILLARY
Glucose-Capillary: 534 mg/dL — ABNORMAL HIGH (ref 70–99)
Glucose-Capillary: 300 mg/dL — ABNORMAL HIGH (ref 70–99)
Glucose-Capillary: 301 mg/dL — ABNORMAL HIGH (ref 70–99)
Glucose-Capillary: 277 mg/dL — ABNORMAL HIGH (ref 70–99)

## 2012-10-20 LAB — URINALYSIS, ROUTINE W REFLEX MICROSCOPIC
Glucose, UA: >1000 mg/dL — AB
Leukocytes, UA: NEGATIVE
Protein, ur: NEGATIVE mg/dL
Urobilinogen, UA: 0.2 mg/dL (ref 0.0–1.0)

## 2012-10-20 LAB — LIPASE, BLOOD: Lipase: 46 U/L (ref 11–59)

## 2012-10-20 LAB — BLOOD GAS, ARTERIAL
Bicarbonate: 17 mEq/L — ABNORMAL LOW (ref 20.0–24.0)
O2 Saturation: 96.9 %
Patient temperature: 98.6
TCO2: 15.6 mmol/L (ref 0–100)
pH, Arterial: 7.328 — ABNORMAL LOW (ref 7.350–7.450)

## 2012-10-20 LAB — COMPREHENSIVE METABOLIC PANEL
BUN: 15 mg/dL (ref 6–23)
Calcium: 9.5 mg/dL (ref 8.4–10.5)
GFR calc Af Amer: >90 mL/min (ref >90)
Glucose, Bld: 494 mg/dL — ABNORMAL HIGH (ref 70–99)
Total Protein: 7.5 g/dL (ref 6.0–8.3)

## 2012-10-20 LAB — URINE MICROSCOPIC-ADD ON

## 2012-10-20 MED ORDER — PROMETHAZINE HCL 25 MG/ML IJ SOLN
12.5000 mg | Freq: Once | INTRAMUSCULAR | Status: AC
  Administered 2012-10-20: 12.5 mg via INTRAVENOUS
  Filled 2012-10-20: qty 1

## 2012-10-20 MED ORDER — PANTOPRAZOLE SODIUM 40 MG IV SOLR
40.0000 mg | Freq: Two times a day (BID) | INTRAVENOUS | Status: DC
  Administered 2012-10-20: 40 mg via INTRAVENOUS
  Filled 2012-10-20 (×3): qty 40

## 2012-10-20 MED ORDER — SODIUM CHLORIDE 0.9 % IV SOLN
INTRAVENOUS | Status: DC
  Administered 2012-10-20: 14:00:00 via INTRAVENOUS

## 2012-10-20 MED ORDER — GABAPENTIN 300 MG PO CAPS
300.0000 mg | ORAL_CAPSULE | Freq: Two times a day (BID) | ORAL | Status: DC
  Administered 2012-10-21 – 2012-10-22 (×3): 300 mg via ORAL
  Filled 2012-10-20 (×4): qty 1

## 2012-10-20 MED ORDER — CALCIUM CARBONATE ANTACID 500 MG PO CHEW
2.0000 | CHEWABLE_TABLET | Freq: Every day | ORAL | Status: DC | PRN
  Filled 2012-10-20: qty 2

## 2012-10-20 MED ORDER — CALCIUM CARBONATE ANTACID 500 MG PO CHEW
1.0000 | CHEWABLE_TABLET | Freq: Every day | ORAL | Status: DC | PRN
  Filled 2012-10-20: qty 1

## 2012-10-20 MED ORDER — METOCLOPRAMIDE HCL 5 MG/ML IJ SOLN
10.0000 mg | Freq: Once | INTRAMUSCULAR | Status: AC
  Administered 2012-10-20: 10 mg via INTRAVENOUS

## 2012-10-20 MED ORDER — IOHEXOL 300 MG/ML  SOLN
50.0000 mL | Freq: Once | INTRAMUSCULAR | Status: AC | PRN
  Administered 2012-10-20: 50 mL via ORAL

## 2012-10-20 MED ORDER — METOCLOPRAMIDE HCL 5 MG/ML IJ SOLN
10.0000 mg | Freq: Once | INTRAMUSCULAR | Status: DC
  Filled 2012-10-20: qty 2

## 2012-10-20 MED ORDER — INSULIN REGULAR HUMAN 100 UNIT/ML IJ SOLN: INTRAMUSCULAR | Status: DC

## 2012-10-20 MED ORDER — RISPERIDONE 2 MG PO TABS
4.0000 mg | ORAL_TABLET | Freq: Every day | ORAL | Status: DC
  Administered 2012-10-21 – 2012-10-22 (×2): 4 mg via ORAL
  Filled 2012-10-20 (×2): qty 2

## 2012-10-20 MED ORDER — TRAZODONE HCL 150 MG PO TABS
150.0000 mg | ORAL_TABLET | Freq: Every day | ORAL | Status: DC
Start: 2012-10-20 — End: 2012-10-22
  Administered 2012-10-20 – 2012-10-21 (×2): 150 mg via ORAL
  Filled 2012-10-20 (×3): qty 1

## 2012-10-20 MED ORDER — ASPIRIN EC 81 MG PO TBEC
81.0000 mg | DELAYED_RELEASE_TABLET | Freq: Every day | ORAL | Status: DC
  Administered 2012-10-21 – 2012-10-22 (×2): 81 mg via ORAL
  Filled 2012-10-20 (×2): qty 1

## 2012-10-20 MED ORDER — DEXTROSE 50 % IV SOLN: 25.0000 mL | INTRAVENOUS | Status: DC | PRN

## 2012-10-20 MED ORDER — ACETAMINOPHEN 325 MG PO TABS: 650.0000 mg | ORAL_TABLET | Freq: Four times a day (QID) | ORAL | Status: DC | PRN

## 2012-10-20 MED ORDER — IOHEXOL 300 MG/ML  SOLN
100.0000 mL | Freq: Once | INTRAMUSCULAR | Status: AC | PRN
  Administered 2012-10-20: 100 mL via INTRAVENOUS

## 2012-10-20 MED ORDER — ENOXAPARIN SODIUM 40 MG/0.4ML ~~LOC~~ SOLN
40.0000 mg | SUBCUTANEOUS | Status: DC
  Administered 2012-10-20 – 2012-10-21 (×2): 40 mg via SUBCUTANEOUS
  Filled 2012-10-20 (×3): qty 0.4

## 2012-10-20 MED ORDER — ONDANSETRON HCL 4 MG PO TABS: 4.0000 mg | ORAL_TABLET | ORAL | Status: DC | PRN

## 2012-10-20 MED ORDER — SODIUM CHLORIDE 0.9 % IV BOLUS (SEPSIS)
1000.0000 mL | Freq: Once | INTRAVENOUS | Status: AC
  Administered 2012-10-20: 1000 mL via INTRAVENOUS

## 2012-10-20 MED ORDER — BISACODYL 10 MG RE SUPP: 10.0000 mg | Freq: Every day | RECTAL | Status: DC | PRN

## 2012-10-20 MED ORDER — SODIUM CHLORIDE 0.9 % IV SOLN: INTRAVENOUS | Status: DC

## 2012-10-20 MED ORDER — DEXTROSE-NACL 5-0.45 % IV SOLN
INTRAVENOUS | Status: DC
  Administered 2012-10-20: 16:00:00 via INTRAVENOUS

## 2012-10-20 MED ORDER — SODIUM CHLORIDE 0.9 % IV SOLN
INTRAVENOUS | Status: DC
  Administered 2012-10-20: 2.4 [IU]/h via INTRAVENOUS
  Filled 2012-10-20: qty 1

## 2012-10-20 MED ORDER — INSULIN GLARGINE 100 UNIT/ML ~~LOC~~ SOLN
10.0000 [IU] | Freq: Every day | SUBCUTANEOUS | Status: DC
  Administered 2012-10-20: 10 [IU] via SUBCUTANEOUS
  Filled 2012-10-20: qty 0.1

## 2012-10-20 MED ORDER — INSULIN ASPART 100 UNIT/ML IV SOLN
10.0000 [IU] | Freq: Once | INTRAVENOUS | Status: AC
  Administered 2012-10-20: 10 [IU] via INTRAVENOUS
  Filled 2012-10-20: qty 0.1

## 2012-10-20 MED ORDER — INSULIN REGULAR BOLUS VIA INFUSION
0.0000 [IU] | Freq: Three times a day (TID) | INTRAVENOUS | Status: DC
  Filled 2012-10-20: qty 10

## 2012-10-20 MED ORDER — ACETAMINOPHEN 650 MG RE SUPP: 650.0000 mg | Freq: Four times a day (QID) | RECTAL | Status: DC | PRN

## 2012-10-20 MED ORDER — ONDANSETRON HCL 4 MG/2ML IJ SOLN
4.0000 mg | Freq: Once | INTRAMUSCULAR | Status: AC
  Administered 2012-10-20: 4 mg via INTRAVENOUS
  Filled 2012-10-20: qty 2

## 2012-10-20 MED ORDER — DEXTROSE-NACL 5-0.45 % IV SOLN
INTRAVENOUS | Status: DC
  Administered 2012-10-20: via INTRAVENOUS

## 2012-10-20 MED ORDER — FLEET ENEMA 7-19 GM/118ML RE ENEM
1.0000 | ENEMA | Freq: Every day | RECTAL | Status: DC | PRN
  Filled 2012-10-20: qty 1

## 2012-10-20 MED ORDER — ONDANSETRON HCL 4 MG/2ML IJ SOLN
4.0000 mg | Freq: Four times a day (QID) | INTRAMUSCULAR | Status: DC | PRN
  Administered 2012-10-21: 4 mg via INTRAVENOUS
  Filled 2012-10-20: qty 2

## 2012-10-20 MED ORDER — SODIUM CHLORIDE 0.9 % IJ SOLN
3.0000 mL | Freq: Two times a day (BID) | INTRAMUSCULAR | Status: DC
  Administered 2012-10-22: 3 mL via INTRAVENOUS

## 2012-10-20 MED ORDER — HYDROMORPHONE HCL PF 1 MG/ML IJ SOLN
1.0000 mg | INTRAMUSCULAR | Status: DC | PRN
  Administered 2012-10-20 – 2012-10-21 (×3): 1 mg via INTRAVENOUS
  Filled 2012-10-20 (×3): qty 1

## 2012-10-20 NOTE — ED Notes (Signed)
CBG registered 534 on ED Glucometer.

## 2012-10-20 NOTE — ED Notes (Signed)
CBG registered 300 on ED Glucometer.

## 2012-10-20 NOTE — ED Notes (Addendum)
Per EMS: n/v x1 week, roommate states pt has not been eating and has lost weight this week. Pt has generalized abdominal pain from vomiting.

## 2012-10-20 NOTE — ED Notes (Signed)
Pt vomiting.

## 2012-10-20 NOTE — H&P (Signed)
[facility] History and Physical  Mary [name] WJX:[id number] DOB: 02-08-1971 DOA: 10/20/2012  Referring physician: Dr. Georgina Snell PCP: Dartha Lodge, FNP  Specialists:   Chief Complaint: Nausea vomiting  HPI: Mary [name] is a 42 y.o. female with history of multiple medical problems as listed below including diabetes mellitus type 1 with prior admissions for DKA who presents with nausea vomiting. She states that for the past one week she has had a nausea or vomiting but it worsened today and she had multiple episodes-nonbloody. She also reports diffuse abdominal pain. She denies cough, chest pain, dysuria, diarrhea, melena and no hematochezia. She was seen in the ED P. and blood glucose was 534 and bmet revealed an anion gap acidosis. CT scan of the abdomen and pelvis was done which showed abundant stool in the right colon and cecum, otherwise negative. She was started on IV insulin her glucostabilizer and is admitted for further evaluation and management.   Review of Systems: The patient denies anorexia, fever, weight loss,, vision loss, decreased hearing, hoarseness, chest pain, syncope, dyspnea on exertion, peripheral edema, balance deficits, hemoptysis, abdominal pain, melena, hematochezia, severe indigestion/heartburn, hematuria, incontinence, genital sores, muscle weakness, suspicious skin lesions, transient blindness, difficulty walking, depression, unusual weight change, abnormal bleeding, enlarged lymph nodes.  Past Medical History  Diagnosis Date  . Diabetes mellitus     diagnosed in 1996-always been on insulin  . Kidney stone   . Seizure disorder     started with pregnancy of first son  . Gastritis   . Bipolar 2 disorder   . Post traumatic stress disorder     "flipping out" after people close to her died  . DKA (diabetic ketoacidoses)     Recurrent admissions for DKA, medication non-complaince, poor social situation  . Cirrhosis   . Angina   . Heart murmur   .  Shortness of breath     occassionally  . Seizures     pt states 8yrs ago  . Headache     occassionally  . Arthritis     r ankle-S/P surgery 2005-11-17)  . Anxiety   . Peripheral vascular disease     numbness bilaterally feet  . Anemia     childhood  . H/O hiatal hernia     2 yrs ago  . Depression     childhood  . Hep C w/o coma, chronic     biopsy in 2010-no rx-was supposed to see a hepatologist in [place]  . Bipolar disorder   . PTSD (post-traumatic stress disorder)   . Neuropathy     in legs and feet and hands   Past Surgical History  Procedure Laterality Date  . Ankle surgery    . Cesarean section    . Endometrial biopsy  06/22/2012   Social History:  reports that she has been smoking Cigarettes.  She has a 2.5 pack-year smoking history. She has never used smokeless tobacco. She reports that she uses illicit drugs (Marijuana) about once per week. She reports that she does not drink alcohol.  where does patient live--home  Can patient participate in ADLs  Allergies  Allergen Reactions  . Penicillins Rash    Family History  Problem Relation Age of Onset  . Diabetes Mother     currently 35  . Fibromyalgia Mother   . Cirrhosis Father     died in 11/17/97   Prior to Admission medications   Medication Sig Start Date End Date Taking? Authorizing Provider  aspirin EC 81 MG  tablet Take 81 mg by mouth daily.    Yes Historical Provider, MD  calcium carbonate (TUMS - DOSED IN MG ELEMENTAL CALCIUM) 500 MG chewable tablet Chew 2 tablets by mouth daily as needed. For heartburn   Yes Historical Provider, MD  gabapentin (NEURONTIN) 300 MG capsule Take 300 mg by mouth 2 (two) times daily.   Yes Historical Provider, MD  insulin glargine (LANTUS) 100 UNIT/ML injection Inject 30 Units into the skin at bedtime.    Yes Historical Provider, MD  metFORMIN (GLUCOPHAGE) 500 MG tablet Take 500 mg by mouth at bedtime.   Yes Historical Provider, MD  ranitidine (ZANTAC) 150 MG capsule Take 150  mg by mouth 2 (two) times daily as needed. For heartburn   Yes Historical Provider, MD  risperidone (RISPERDAL) 4 MG tablet Take 4 mg by mouth daily.   Yes Historical Provider, MD  traZODone (DESYREL) 150 MG tablet Take 150 mg by mouth at bedtime.   Yes Historical Provider, MD   Physical Exam: Filed Vitals:   10/20/12 0739 10/20/12 1103 10/20/12 1645 10/20/12 1700  BP: 104/62 146/84 119/67 112/58  Pulse: 86 95 75 76  Temp: 98.1 F (36.7 C)  98.8 F (37.1 C)   TempSrc: Oral  Oral   Resp: 18 16 16 17   Height:   5\' 4"  (1.626 m)   Weight:   49.6 kg (109 lb 5.6 oz)   SpO2: 100% 100% 98% 100%    Constitutional: Vital signs reviewed.  Patient is a well-developed and well-nourished  in no acute distress and cooperative with exam.  lethargic and oriented x3.  Head: Normocephalic and atraumatic Mouth: no erythema or exudates, MMM Eyes: PERRL, EOMI, conjunctivae normal, No scleral icterus.  Neck: Supple, Trachea midline normal ROM, No JVD, mass, thyromegaly, or carotid bruit present.  Cardiovascular: RRR, S1 normal, S2 normal, no MRG, pulses symmetric and intact bilaterally Pulmonary/Chest: CTAB, no wheezes, rales, or rhonchi Abdominal: Soft. Diffuse tenderness present, no rebound, non-distended, bowel sounds are normal, no masses, organomegaly, or guarding present.  Extremities: No cyanosis and no edema  Neurological: A&O x3, Strength is normal and symmetric bilaterally, cranial nerve II-XII are grossly intact, no focal motor deficit, sensory intact to light touch bilaterally.  Skin: Warm, dry and intact. No rash.    Labs on Admission:  Basic Metabolic Panel:  Recent Labs Lab 10/20/12 0855  NA 130*  K 4.6  CL 91*  CO2 18*  GLUCOSE 494*  BUN 15  CREATININE 0.47*  CALCIUM 9.5   Liver Function Tests:  Recent Labs Lab 10/20/12 0855  AST 52*  ALT 53*  ALKPHOS 84  BILITOT 0.2*  PROT 7.5  ALBUMIN 3.6    Recent Labs Lab 10/20/12 0855  LIPASE 46   No results found for  this basename: AMMONIA,  in the last 168 hours CBC:  Recent Labs Lab 10/20/12 0800  WBC 8.3  NEUTROABS 4.6  HGB 13.1  HCT 41.3  MCV 71.0*  PLT 188   Cardiac Enzymes: No results found for this basename: CKTOTAL, CKMB, CKMBINDEX, TROPONINI,  in the last 168 hours  BNP (last 3 results) No results found for this basename: PROBNP,  in the last 8760 hours CBG:  Recent Labs Lab 10/20/12 1110 10/20/12 1319 10/20/12 1420 10/20/12 1501 10/20/12 1556  GLUCAP 351* 300* 301* 277* 199*    Radiological Exams on Admission: Ct Abdomen Pelvis W Contrast  10/20/2012  *RADIOLOGY REPORT*  Clinical Data: Abdominal pain, weight loss  CT ABDOMEN AND PELVIS WITH  CONTRAST  Technique:  Multidetector CT imaging of the abdomen and pelvis was performed following the standard protocol during bolus administration of intravenous contrast.  Contrast: OMNIPAQUE IOHEXOL 300 MG/ML  SOLN  Comparison: 09/12/2011  Findings: Levoscoliosis noted lumbar spine.  Lung bases are unremarkable.  Small hiatal hernia.  Liver, spleen, pancreas and adrenals are unremarkable.  Significant stool noted in the right colon.  There is a low-lying cecum.  Stool noted within cecum.  Normal appendix is clearly visualized axial image 67.  No aortic aneurysm.  No small bowel obstruction.  No ascites or free air.  Kidneys are symmetrical in size and enhancement.  No hydronephrosis or hydroureter.  Delayed renal images shows bilateral renal symmetrical excretion.  Nonspecific fluid is noted within uterus. No adnexal mass is noted.  No distal colonic obstruction.  IMPRESSION:  1.  Abundant stool noted in the right colon and cecum.  No pericecal inflammation.  Normal appendix. 2.  There is a low-lying cecum. 3.  No hydronephrosis or hydroureter. 4.  No small bowel obstruction. 5.  Nonspecific fluid within uterus. 6.  Small hiatal hernia.   Original Report Authenticated By: Natasha Mead, M.D.      Assessment/Plan Principal Problem:   DKA, type  1/DM (diabetes mellitus) uncontrolled -As discussed above, volume depletion/dehydration likely precipitating factor, urinalysis unremarkable for infection. also cycle cardiac enzymes. -Aggressive hydration with IV fluids, continue IV insulin per glucostabilizer protocol and follow. -Serial Bmets follow and change to subcutaneous insulin when gap acidosis resolves Active Problems:   Bipolar disorder -Continue outpatient medications Abdominal pain -CT scan of abdomen and pelvis with constipation, otherwise negative -Treat constipation, pain management and follow.   Hyponatremia -Likely secondary to volume depletion and component of pseudohyponatremia, hydrate follow and recheck.   Cirrhosis of liver -Stable with mildly elevated LFTs   History of Non compliance w medication regimen     Code Status: full Family Communication: none at bedside Disposition Plan: admit to step down  Time spent: >30MINs  Kela Millin [facility] Pager [phone]  If 7PM-7AM, please contact night-coverage [email or web address] Password [facility] 10/20/2012, 6:23 PM

## 2012-10-20 NOTE — ED Notes (Signed)
Patient transported to CT 

## 2012-10-20 NOTE — ED Notes (Signed)
JYN:[id number]<AO> Expected date:<BR> Expected time:<BR> Means of arrival:<BR> Comments:<BR> hyperglycemic

## 2012-10-20 NOTE — Progress Notes (Signed)
Patient was seen by the [facility] for GCCN/OC application. During interaction patient did a lot of grunting but information was left.

## 2012-10-20 NOTE — ED Notes (Signed)
Insulin drip moved up to 4.3 units/hr per glucose of 277

## 2012-10-20 NOTE — ED Notes (Signed)
Multiple attempts to get blood from pt, unsuccessful after 3 nurses and phlebotomy, pt tolerated well. PA made aware and arterial stick order.

## 2012-10-20 NOTE — Progress Notes (Signed)
[facility] ED CM noted pt for admission, no insurance coverage listed and pcp as anthony steele  CM consulted with [facility] liaison who went to see pt Reports pt noted with minimal communication but left with resources  CM visited pt and also noted minimal communication Pt noted to be lying on her right side in fetal position with covers partially covering her head Receiving iv fluids.  Pt noted grunting responses except when she had to answer open ended questions Pt confirms she still sees Dartha Lodge as a pcp  Last seen in 3 weeks ago by Dr Christin Fudge.  Reports [facility] medications managed also by [facility]  Cm provided pt with a list of alternative self pay pcps and the [facility] liaison information in her pt belonging bag and placed beside pt personal bags in chair for review at a later time when pt "feels better"

## 2012-10-20 NOTE — ED Notes (Signed)
Insulin drip moved to 2.8 per glucose of 199

## 2012-10-20 NOTE — ED Provider Notes (Signed)
History     CSN: [id number]  Arrival date & time 10/20/12  0731   First MD Initiated Contact with Patient 10/20/12 [phone]      Chief Complaint  Patient presents with  . Hyperglycemia    (Consider location/radiation/quality/duration/timing/severity/associated sxs/prior treatment) HPI Comments: Patient presents to the ER for evaluation of nausea and vomiting. Patient reports that she has been sick for one week. She has not been able to either drink. Patient has had progressively worsening generalized abdominal pain and cramping over this period of time. She is a diabetic, reports that she has forgotten to take her medications at least one time this week. She hasn't been checking her sugar. It was found to be high this morning. Patient does not think she has had a fever. There has not been any diarrhea associated with the symptoms. Patient denies upper respiratory symptoms.   Past Medical History  Diagnosis Date  . Diabetes mellitus     diagnosed in 1996-always been on insulin  . Kidney stone   . Seizure disorder     started with pregnancy of first son  . Gastritis   . Bipolar 2 disorder   . Post traumatic stress disorder     "flipping out" after people close to her died  . DKA (diabetic ketoacidoses)     Recurrent admissions for DKA, medication non-complaince, poor social situation  . Cirrhosis   . Angina   . Heart murmur   . Shortness of breath     occassionally  . Seizures     pt states 83yrs ago  . Headache     occassionally  . Arthritis     r ankle-S/P surgery 11/06/2005)  . Anxiety   . Peripheral vascular disease     numbness bilaterally feet  . Anemia     childhood  . H/O hiatal hernia     2 yrs ago  . Depression     childhood  . Hep C w/o coma, chronic     biopsy in 2010-no rx-was supposed to see a hepatologist in [place]  . Bipolar disorder   . PTSD (post-traumatic stress disorder)   . Neuropathy     in legs and feet and hands    Past Surgical History   Procedure Laterality Date  . Ankle surgery    . Cesarean section    . Endometrial biopsy  06/22/2012    Family History  Problem Relation Age of Onset  . Diabetes Mother     currently 84  . Fibromyalgia Mother   . Cirrhosis Father     died in 06-Nov-1997    History  Substance Use Topics  . Smoking status: Current Every Day Smoker -- 0.10 packs/day for 25 years    Types: Cigarettes  . Smokeless tobacco: Never Used  . Alcohol Use: No     Comment: Endorses hasn't been drinking    OB History   Grav Para Term Preterm Abortions TAB SAB Ect Mult Living   4 3 3  0 1 0 1 0 1 2      Review of Systems  Constitutional: Positive for unexpected weight change. Negative for fever.  Gastrointestinal: Positive for nausea, vomiting and abdominal pain.  All other systems reviewed and are negative.    Allergies  Penicillins  Home Medications   Current Outpatient Rx  Name  Route  Sig  Dispense  Refill  . aspirin EC 81 MG tablet   Oral   Take 81 mg by mouth daily.          Marland Kitchen  calcium carbonate (TUMS - DOSED IN MG ELEMENTAL CALCIUM) 500 MG chewable tablet   Oral   Chew 2 tablets by mouth daily as needed. For heartburn         . gabapentin (NEURONTIN) 300 MG capsule   Oral   Take 1 capsule (300 mg total) by mouth 2 (two) times daily.   62 capsule   0   . insulin glargine (LANTUS) 100 UNIT/ML injection   Subcutaneous   Inject 30 Units into the skin at bedtime.          . metFORMIN (GLUCOPHAGE) 500 MG tablet   Oral   Take 500 mg by mouth at bedtime.         . ranitidine (ZANTAC) 150 MG capsule   Oral   Take 150 mg by mouth 2 (two) times daily as needed. For heartburn         . risperidone (RISPERDAL) 4 MG tablet   Oral   Take 4 mg by mouth daily.         . traZODone (DESYREL) 150 MG tablet   Oral   Take 150 mg by mouth at bedtime.           BP 104/62  Pulse 86  Temp(Src) 98.1 F (36.7 C) (Oral)  Resp 18  SpO2 100%  LMP 09/03/2012  Physical Exam   Constitutional: She is oriented to person, place, and time. She appears well-developed and well-nourished. No distress.  HENT:  Head: Normocephalic and atraumatic.  Right Ear: Hearing normal.  Nose: Nose normal.  Mouth/Throat: Mucous membranes are dry.  Eyes: Conjunctivae and EOM are normal. Pupils are equal, round, and reactive to light.  Neck: Normal range of motion. Neck supple.  Cardiovascular: Normal rate, regular rhythm, S1 normal and S2 normal.  Exam reveals no gallop and no friction rub.   No murmur heard. Pulmonary/Chest: Effort normal and breath sounds normal. No respiratory distress. She exhibits no tenderness.  Abdominal: Soft. Normal appearance and bowel sounds are normal. There is no hepatosplenomegaly. There is generalized tenderness. There is no rebound, no guarding, no tenderness at McBurney's point and negative Murphy's sign. No hernia.  Musculoskeletal: Normal range of motion.  Neurological: She is alert and oriented to person, place, and time. She has normal strength. No cranial nerve deficit or sensory deficit. Coordination normal. GCS eye subscore is 4. GCS verbal subscore is 5. GCS motor subscore is 6.  Skin: Skin is warm, dry and intact. No rash noted. No cyanosis.  Psychiatric: She has a normal mood and affect. Her speech is normal and behavior is normal. Thought content normal.    ED Course  Procedures (including critical care time)  Labs Reviewed  GLUCOSE, CAPILLARY - Abnormal; Notable for the following:    Glucose-Capillary 534 (*)    All other components within normal limits  CBC WITH DIFFERENTIAL - Abnormal; Notable for the following:    RBC 5.82 (*)    MCV 71.0 (*)    MCH 22.5 (*)    All other components within normal limits  URINALYSIS, ROUTINE W REFLEX MICROSCOPIC - Abnormal; Notable for the following:    Specific Gravity, Urine 1.037 (*)    Glucose, UA >1000 (*)    Ketones, ur >80 (*)    All other components within normal limits  KETONES,  QUALITATIVE - Abnormal; Notable for the following:    Acetone, Bld MODERATE (*)    All other components within normal limits  COMPREHENSIVE METABOLIC PANEL - Abnormal; Notable for  the following:    Sodium 130 (*)    Chloride 91 (*)    CO2 18 (*)    Glucose, Bld 494 (*)    Creatinine, Ser 0.47 (*)    AST 52 (*)    ALT 53 (*)    Total Bilirubin 0.2 (*)    All other components within normal limits  URINE MICROSCOPIC-ADD ON - Abnormal; Notable for the following:    Squamous Epithelial / LPF FEW (*)    All other components within normal limits  GLUCOSE, CAPILLARY - Abnormal; Notable for the following:    Glucose-Capillary 351 (*)    All other components within normal limits  LACTIC ACID, PLASMA  LIPASE, BLOOD  LACTIC ACID, PLASMA  BLOOD GAS, ARTERIAL  POCT PREGNANCY, URINE   Ct Abdomen Pelvis W Contrast  10/20/2012  *RADIOLOGY REPORT*  Clinical Data: Abdominal pain, weight loss  CT ABDOMEN AND PELVIS WITH CONTRAST  Technique:  Multidetector CT imaging of the abdomen and pelvis was performed following the standard protocol during bolus administration of intravenous contrast.  Contrast: OMNIPAQUE IOHEXOL 300 MG/ML  SOLN  Comparison: 09/12/2011  Findings: Levoscoliosis noted lumbar spine.  Lung bases are unremarkable.  Small hiatal hernia.  Liver, spleen, pancreas and adrenals are unremarkable.  Significant stool noted in the right colon.  There is a low-lying cecum.  Stool noted within cecum.  Normal appendix is clearly visualized axial image 67.  No aortic aneurysm.  No small bowel obstruction.  No ascites or free air.  Kidneys are symmetrical in size and enhancement.  No hydronephrosis or hydroureter.  Delayed renal images shows bilateral renal symmetrical excretion.  Nonspecific fluid is noted within uterus. No adnexal mass is noted.  No distal colonic obstruction.  IMPRESSION:  1.  Abundant stool noted in the right colon and cecum.  No pericecal inflammation.  Normal appendix. 2.  There  is a low-lying cecum. 3.  No hydronephrosis or hydroureter. 4.  No small bowel obstruction. 5.  Nonspecific fluid within uterus. 6.  Small hiatal hernia.   Original Report Authenticated By: Natasha Mead, M.D.      Diagnosis: DKA    MDM   this presents to the ER for evaluation of a blood sugar and abdominal discomfort. Patient complaining of diffuse abdominal pain with nausea and vomiting. Her exam was fairly benign, however. Blood work was all normal except for the evidence of significant blood sugar location with DKA. Because of the patient's complaints of abdominal pain, CAT scan of abdomen and pelvis was performed. No acute pathology was noted.  Patient was initially managed with IV fluids and IV insulin bolus. She'll require hospitalization for further management of DKA on IV insulin drip. Case discussed with hospitalist. Will be admitted to step down unit.        Gilda Crease, MD 10/20/12 [phone]

## 2012-10-20 NOTE — ED Notes (Signed)
CBG registered 277 on ED Glucometer RN notified.

## 2012-10-21 DIAGNOSIS — F3189 Other bipolar disorder: Secondary | ICD-10-CM

## 2012-10-21 DIAGNOSIS — E860 Dehydration: Secondary | ICD-10-CM

## 2012-10-21 LAB — BASIC METABOLIC PANEL
BUN: 9 mg/dL (ref 6–23)
BUN: 8 mg/dL (ref 6–23)
BUN: 8 mg/dL (ref 6–23)
BUN: 9 mg/dL (ref 6–23)
CO2: 19 mEq/L (ref 19–32)
CO2: 23 mEq/L (ref 19–32)
Calcium: 8.4 mg/dL (ref 8.4–10.5)
Calcium: 8.6 mg/dL (ref 8.4–10.5)
Calcium: 8.4 mg/dL (ref 8.4–10.5)
Calcium: 8.6 mg/dL (ref 8.4–10.5)
Chloride: 103 mEq/L (ref 96–112)
Chloride: 103 mEq/L (ref 96–112)
Chloride: 106 mEq/L (ref 96–112)
Chloride: 106 mEq/L (ref 96–112)
Creatinine, Ser: 0.4 mg/dL — ABNORMAL LOW (ref 0.50–1.10)
Creatinine, Ser: 0.43 mg/dL — ABNORMAL LOW (ref 0.50–1.10)
Creatinine, Ser: 0.5 mg/dL (ref 0.50–1.10)
GFR calc Af Amer: >90 mL/min (ref >90)
GFR calc Af Amer: >90 mL/min (ref >90)
GFR calc Af Amer: >90 mL/min (ref >90)
GFR calc Af Amer: >90 mL/min (ref >90)
GFR calc Af Amer: >90 mL/min (ref >90)
GFR calc non Af Amer: >90 mL/min (ref >90)
GFR calc non Af Amer: >90 mL/min (ref >90)
GFR calc non Af Amer: >90 mL/min (ref >90)
GFR calc non Af Amer: >90 mL/min (ref >90)
Glucose, Bld: 223 mg/dL — ABNORMAL HIGH (ref 70–99)
Potassium: 3.7 mEq/L (ref 3.5–5.1)
Potassium: 3.9 mEq/L (ref 3.5–5.1)
Potassium: 3.3 mEq/L — ABNORMAL LOW (ref 3.5–5.1)
Potassium: 3.3 mEq/L — ABNORMAL LOW (ref 3.5–5.1)
Sodium: 137 mEq/L (ref 135–145)
Sodium: 138 mEq/L (ref 135–145)
Sodium: 139 mEq/L (ref 135–145)

## 2012-10-21 LAB — GLUCOSE, CAPILLARY
Glucose-Capillary: 103 mg/dL — ABNORMAL HIGH (ref 70–99)
Glucose-Capillary: 125 mg/dL — ABNORMAL HIGH (ref 70–99)
Glucose-Capillary: 152 mg/dL — ABNORMAL HIGH (ref 70–99)
Glucose-Capillary: 185 mg/dL — ABNORMAL HIGH (ref 70–99)
Glucose-Capillary: 191 mg/dL — ABNORMAL HIGH (ref 70–99)
Glucose-Capillary: 211 mg/dL — ABNORMAL HIGH (ref 70–99)
Glucose-Capillary: 251 mg/dL — ABNORMAL HIGH (ref 70–99)
Glucose-Capillary: 310 mg/dL — ABNORMAL HIGH (ref 70–99)
Glucose-Capillary: 63 mg/dL — ABNORMAL LOW (ref 70–99)
Glucose-Capillary: 68 mg/dL — ABNORMAL LOW (ref 70–99)
Glucose-Capillary: 92 mg/dL (ref 70–99)
Glucose-Capillary: 95 mg/dL (ref 70–99)

## 2012-10-21 LAB — TROPONIN I: Troponin I: <0.3 ng/mL (ref <0.30)

## 2012-10-21 MED ORDER — INSULIN ASPART 100 UNIT/ML ~~LOC~~ SOLN: 0.0000 [IU] | Freq: Three times a day (TID) | SUBCUTANEOUS | Status: DC

## 2012-10-21 MED ORDER — INSULIN GLARGINE 100 UNIT/ML ~~LOC~~ SOLN: 20.0000 [IU] | Freq: Once | SUBCUTANEOUS | Status: DC

## 2012-10-21 MED ORDER — SODIUM CHLORIDE 0.9 % IV SOLN: INTRAVENOUS | Status: DC

## 2012-10-21 MED ORDER — POLYETHYLENE GLYCOL 3350 17 G PO PACK
17.0000 g | PACK | Freq: Every day | ORAL | Status: DC
  Administered 2012-10-22: 17 g via ORAL
  Filled 2012-10-21 (×2): qty 1

## 2012-10-21 MED ORDER — FLEET ENEMA 7-19 GM/118ML RE ENEM: 1.0000 | ENEMA | Freq: Once | RECTAL | Status: DC

## 2012-10-21 MED ORDER — INSULIN GLARGINE 100 UNIT/ML ~~LOC~~ SOLN
20.0000 [IU] | Freq: Once | SUBCUTANEOUS | Status: AC
  Administered 2012-10-21: 20 [IU] via SUBCUTANEOUS
  Filled 2012-10-21: qty 0.2

## 2012-10-21 MED ORDER — DEXTROSE 50 % IV SOLN: 25.0000 mL | INTRAVENOUS | Status: DC | PRN

## 2012-10-21 MED ORDER — INSULIN REGULAR BOLUS VIA INFUSION
0.0000 [IU] | Freq: Three times a day (TID) | INTRAVENOUS | Status: DC
  Filled 2012-10-21: qty 10

## 2012-10-21 MED ORDER — INSULIN REGULAR HUMAN 100 UNIT/ML IJ SOLN
INTRAMUSCULAR | Status: DC
  Administered 2012-10-21: 2.1 [IU]/h via INTRAVENOUS

## 2012-10-21 MED ORDER — SODIUM CHLORIDE 0.9 % IV BOLUS (SEPSIS)
500.0000 mL | Freq: Once | INTRAVENOUS | Status: AC
  Administered 2012-10-21: 500 mL via INTRAVENOUS

## 2012-10-21 MED ORDER — SODIUM CHLORIDE 0.9 % IV SOLN
INTRAVENOUS | Status: DC
  Administered 2012-10-21 (×2): via INTRAVENOUS

## 2012-10-21 MED ORDER — INSULIN ASPART 100 UNIT/ML ~~LOC~~ SOLN
0.0000 [IU] | Freq: Every day | SUBCUTANEOUS | Status: DC
  Administered 2012-10-21: 2 [IU] via SUBCUTANEOUS

## 2012-10-21 MED ORDER — INSULIN GLARGINE 100 UNIT/ML ~~LOC~~ SOLN
30.0000 [IU] | Freq: Every day | SUBCUTANEOUS | Status: DC
  Filled 2012-10-21: qty 0.3

## 2012-10-21 MED ORDER — INSULIN ASPART 100 UNIT/ML ~~LOC~~ SOLN: 0.0000 [IU] | Freq: Every day | SUBCUTANEOUS | Status: DC

## 2012-10-21 MED ORDER — SODIUM CHLORIDE 0.9 % IV SOLN
INTRAVENOUS | Status: DC
  Administered 2012-10-21: 11:00:00 via INTRAVENOUS

## 2012-10-21 MED ORDER — PANTOPRAZOLE SODIUM 40 MG PO TBEC
40.0000 mg | DELAYED_RELEASE_TABLET | Freq: Two times a day (BID) | ORAL | Status: DC
  Administered 2012-10-21 – 2012-10-22 (×3): 40 mg via ORAL
  Filled 2012-10-21 (×4): qty 1

## 2012-10-21 MED ORDER — SODIUM CHLORIDE 0.9 % IV SOLN
INTRAVENOUS | Status: DC
  Filled 2012-10-21: qty 1

## 2012-10-21 MED ORDER — INSULIN ASPART 100 UNIT/ML ~~LOC~~ SOLN
0.0000 [IU] | Freq: Three times a day (TID) | SUBCUTANEOUS | Status: DC
  Administered 2012-10-22: 7 [IU] via SUBCUTANEOUS
  Administered 2012-10-22: 2 [IU] via SUBCUTANEOUS

## 2012-10-21 MED ORDER — KCL IN DEXTROSE-NACL 20-5-0.9 MEQ/L-%-% IV SOLN
INTRAVENOUS | Status: DC
  Administered 2012-10-21: 09:00:00 via INTRAVENOUS
  Filled 2012-10-21 (×5): qty 1000

## 2012-10-21 MED ORDER — PROMETHAZINE HCL 25 MG/ML IJ SOLN
12.5000 mg | Freq: Once | INTRAMUSCULAR | Status: AC
  Administered 2012-10-21: 12.5 mg via INTRAVENOUS
  Filled 2012-10-21: qty 1

## 2012-10-21 MED ORDER — INSULIN GLARGINE 100 UNIT/ML ~~LOC~~ SOLN
30.0000 [IU] | Freq: Every evening | SUBCUTANEOUS | Status: DC
  Administered 2012-10-21: 30 [IU] via SUBCUTANEOUS
  Filled 2012-10-21 (×3): qty 0.3

## 2012-10-21 MED ORDER — DEXTROSE-NACL 5-0.45 % IV SOLN
INTRAVENOUS | Status: DC
  Administered 2012-10-21 – 2012-10-22 (×2): via INTRAVENOUS

## 2012-10-21 MED ORDER — DEXTROSE-NACL 5-0.45 % IV SOLN: INTRAVENOUS | Status: DC

## 2012-10-21 NOTE — Progress Notes (Signed)
No improvement in anion gap or bicarb after administration of additional lantus.  Restart insulin gtt

## 2012-10-21 NOTE — Progress Notes (Signed)
Pt given gram crackers and peanunt with milk.

## 2012-10-21 NOTE — Progress Notes (Signed)
Pt with vomitting. Called midlevel awaiting call back.

## 2012-10-21 NOTE — Progress Notes (Signed)
Inpatient Diabetes Program Recommendations  AACE/ADA: New Consensus Statement on Inpatient Glycemic Control (2013)  Target Ranges:  Prepandial:   less than 140 mg/dL      Peak postprandial:   less than 180 mg/dL (1-2 hours)      Critically ill patients:  140 - 180 mg/dL   Reason for Visit: Spoke to patient regarding DKA admission.  She states that she has been sick for past week and therefore also did not take insulin.  She takes Lantus 30 units daily (with no rapid-acting insulin).  She see's NP at clinic Dartha Lodge.  She is able to get insulin (Lantus) from clinic and has not run out.  Interesting that she is not on rapid acting insulin however this may be due to compliance issues.  Patient admits that she does not have any strips for her accu-chek meter.  Recommended that she get the "Reli-on" meter at [facility] due to cost.

## 2012-10-21 NOTE — Progress Notes (Signed)
Upon calling midlevel to make aware of cbg of 315. She informed RN that serum glucose was 150. Upon using different glucometer cbg was 74. Midlevel repaged awaiting call back. Insulin drip  Stopped. Charge nurse informed.

## 2012-10-21 NOTE — Progress Notes (Signed)
CARE MANAGEMENT NOTE 10/21/2012  Patient:  Mary [name], Mary [name]   Account Number:  [id number]  Date Initiated:  10/21/2012  Documentation initiated by:  Jeven Topper  Subjective/Objective Assessment:   pt with hx of diabetic ketoacidosis glucose over 500.     Action/Plan:   home once stable   Anticipated DC Date:  10/24/2012   Anticipated DC Plan:  HOME/SELF CARE  In-house referral  NA      DC Planning Services  NA      PAC Choice  NA   Choice offered to / List presented to:  NA   DME arranged  NA      DME agency  NA     HH arranged  NA      HH agency  NA   Status of service:  In process, will continue to follow Medicare Important Message given?  NA - LOS <3 / Initial given by admissions (If response is "NO", the following Medicare IM given date fields will be blank) Date Medicare IM given:   Date Additional Medicare IM given:    Discharge Disposition:    Per UR Regulation:  Reviewed for med. necessity/level of care/duration of stay  If discussed at Long Length of Stay Meetings, dates discussed:    Comments:  [id number] Earlene Plater, RN, BSN, CCM:  CHART REVIEWED AND UPDATED.  Next chart review due on [id number]. NO DISCHARGE NEEDS PRESENT AT THIS TIME. CASE MANAGEMENT [phone]

## 2012-10-21 NOTE — Clinical Social Work Note (Signed)
CSW made aware Pt resides at [facility] for [facility]. CSW will be following for support, homeless issues and will meet with Pt when she is more oriented.   Doreen Salvage, LCSW ICU/Stepdown Clinical Social Worker [facility] Cell [phone] Hours 8am-1200pm M-F

## 2012-10-21 NOTE — Progress Notes (Signed)
Midlevel paged to discern if RN to stop insulin drip. Awaiting call back.

## 2012-10-21 NOTE — Progress Notes (Signed)
Pt with cbg of 95 on one glucometer and 103 on another.

## 2012-10-21 NOTE — Progress Notes (Addendum)
[facility] PROGRESS NOTE  Mary [name] JWJ:[id number] DOB: 11/29/1970 DOA: 10/20/2012 PCP: Dartha Lodge, FNP  Assessment/Plan  DKA:  Gap of 16 and bicarb down to 19 this morning.  Likely due to not receiving home dose of lantus.  Likely needs higher basal rate -  Lantus 20 units (May start dextrose gtt for now until gap fully closed and bicarb > 20 to keep fingersticks 150-250 as needed) -  BMP at 10AM.  If gap still open or bicarb < 20, will restart gtt -  No evidence of underlying infection  Abdominal pain and nausea likely due to DKA and constipation -  Enema/suppository prn -  Minimize narcotic pain medication in the setting of constipation -  Continue reglan -  Troponins negative -  Lipase neg -  Mild elevation in LFTs may have been due to dehydration, repeat in AM  Chronic pain:  Continue gabapentin   Bipolar d/o and PTSD, currently with labile affect.    HCV with cirrhosis, seizure d/o, anemia, arthritis, PVD, stable.   Diet:  NPO and advance once pain and nausea improving Access:  PIV IVF:  D5 NS + 20kcl at 122ml/h Proph:  lovenox  Code Status: full code Family Communication: spoke with patient and friend who was at bedside Disposition Plan: continue in stepdown while determining if patient needs insulin gtt restarted   Consultants:  none  Procedures:  Ct abd  Antibiotics:  none   HPI/Subjective:  Patient denies CP, SOB, but has diffuse severe abdominal pain and nausea.  Still no BM.      Objective: Filed Vitals:   10/21/12 0000 10/21/12 0100 10/21/12 0300 10/21/12 0400  BP:  90/53 122/49 124/70  Pulse:   83 86  Temp: 98.7 F (37.1 C)   98.6 F (37 C)  TempSrc: Oral     Resp:   18 16  Height:      Weight:      SpO2:   99%     Intake/Output Summary (Last 24 hours) at 10/21/12 0744 Last data filed at 10/21/12 0400  Gross per 24 hour  Intake 1858.6 ml  Output   1400 ml  Net  458.6 ml   Filed Weights   10/20/12 1645  Weight:  49.6 kg (109 lb 5.6 oz)    Exam:   General:  Thin AAF, No acute distress  HEENT:  NCAT, MMM  Cardiovascular:  Mild tachycardia during exam, RR, nl S1, S2 no mrg, 2+ pulses, warm extremities.  Telemetry with rate controlled SR.    Respiratory:  CTAB, no increased WOB  Abdomen:   NABS, soft, diffusely TTP without rebound or guarding, ND  MSK:   Normal tone and bulk, no LEE  Neuro:  Grossly intact  Data Reviewed: Basic Metabolic Panel:  Recent Labs Lab 10/20/12 0855 10/20/12 1857 10/20/12 2322 10/21/12 0219 10/21/12 0545  NA 130* 141 138 137 138  K 4.6 3.3* 3.3* 3.4* 3.7  CL 91* 106 104 107 103  CO2 18* 23 21 21 19   GLUCOSE 494* 132* 176* 127* 223*  BUN 15 10 10 9 9   CREATININE 0.47* 0.41* 0.40* 0.40* 0.43*  CALCIUM 9.5 9.0 8.5 8.4 8.6   Liver Function Tests:  Recent Labs Lab 10/20/12 0855  AST 52*  ALT 53*  ALKPHOS 84  BILITOT 0.2*  PROT 7.5  ALBUMIN 3.6    Recent Labs Lab 10/20/12 0855  LIPASE 46   No results found for this basename: AMMONIA,  in the last 168  hours CBC:  Recent Labs Lab 10/20/12 0800 10/20/12 1857  WBC 8.3 10.3  NEUTROABS 4.6  --   HGB 13.1 11.7*  HCT 41.3 36.5  MCV 71.0* 70.6*  PLT 188 198   Cardiac Enzymes:  Recent Labs Lab 10/20/12 1857 10/21/12 0050 10/21/12 0545  TROPONINI <0.30 <0.30 <0.30   BNP (last 3 results) No results found for this basename: PROBNP,  in the last 8760 hours CBG:  Recent Labs Lab 10/21/12 0414 10/21/12 0415 10/21/12 0447 10/21/12 0524 10/21/12 0624  GLUCAP 95 103* 150* 195* 251*    Recent Results (from the past 240 hour(s))  MRSA PCR SCREENING     Status: None   Collection Time    10/20/12  5:00 PM      Result Value Range Status   MRSA by PCR NEGATIVE  NEGATIVE Final   Comment:            The GeneXpert MRSA Assay (FDA     approved for NASAL specimens     only), is one component of a     comprehensive MRSA colonization     surveillance program. It is not     intended to  diagnose MRSA     infection nor to guide or     monitor treatment for     MRSA infections.     Studies: Ct Abdomen Pelvis W Contrast  10/20/2012  *RADIOLOGY REPORT*  Clinical Data: Abdominal pain, weight loss  CT ABDOMEN AND PELVIS WITH CONTRAST  Technique:  Multidetector CT imaging of the abdomen and pelvis was performed following the standard protocol during bolus administration of intravenous contrast.  Contrast: OMNIPAQUE IOHEXOL 300 MG/ML  SOLN  Comparison: 09/12/2011  Findings: Levoscoliosis noted lumbar spine.  Lung bases are unremarkable.  Small hiatal hernia.  Liver, spleen, pancreas and adrenals are unremarkable.  Significant stool noted in the right colon.  There is a low-lying cecum.  Stool noted within cecum.  Normal appendix is clearly visualized axial image 67.  No aortic aneurysm.  No small bowel obstruction.  No ascites or free air.  Kidneys are symmetrical in size and enhancement.  No hydronephrosis or hydroureter.  Delayed renal images shows bilateral renal symmetrical excretion.  Nonspecific fluid is noted within uterus. No adnexal mass is noted.  No distal colonic obstruction.  IMPRESSION:  1.  Abundant stool noted in the right colon and cecum.  No pericecal inflammation.  Normal appendix. 2.  There is a low-lying cecum. 3.  No hydronephrosis or hydroureter. 4.  No small bowel obstruction. 5.  Nonspecific fluid within uterus. 6.  Small hiatal hernia.   Original Report Authenticated By: Natasha Mead, M.D.     Scheduled Meds: . aspirin EC  81 mg Oral Daily  . enoxaparin (LOVENOX) injection  40 mg Subcutaneous Q24H  . gabapentin  300 mg Oral BID  . insulin regular  0-10 Units Intravenous TID WC  . pantoprazole (PROTONIX) IV  40 mg Intravenous Q12H  . risperidone  4 mg Oral Daily  . sodium chloride  3 mL Intravenous Q12H  . traZODone  150 mg Oral QHS   Continuous Infusions: . sodium chloride Stopped (10/20/12 1830)  . sodium chloride 125 mL/hr at 10/21/12 0552  . sodium  chloride    . dextrose 5 % and 0.45% NaCl    . insulin (NOVOLIN-R) infusion      Principal Problem:   DKA, type 1 Active Problems:   Cirrhosis of liver   Non compliance w  medication regimen   Bipolar disorder   DM (diabetes mellitus), type 1, uncontrolled   Abdominal pain   Hyponatremia    Time spent: 30 min    Kyllian Clingerman, [facility]  [facility] Pager [phone]. If 7PM-7AM, please contact night-coverage at [email or web address], password [facility] 10/21/2012, 7:44 AM  LOS: 1 day

## 2012-10-22 DIAGNOSIS — D72829 Elevated white blood cell count, unspecified: Secondary | ICD-10-CM

## 2012-10-22 DIAGNOSIS — R112 Nausea with vomiting, unspecified: Secondary | ICD-10-CM

## 2012-10-22 LAB — COMPREHENSIVE METABOLIC PANEL
ALT: 48 U/L — ABNORMAL HIGH (ref 0–35)
AST: 61 U/L — ABNORMAL HIGH (ref 0–37)
Albumin: 2.6 g/dL — ABNORMAL LOW (ref 3.5–5.2)
Alkaline Phosphatase: 62 U/L (ref 39–117)
BUN: 10 mg/dL (ref 6–23)
Chloride: 102 mEq/L (ref 96–112)
Potassium: 3 mEq/L — ABNORMAL LOW (ref 3.5–5.1)
Sodium: 133 mEq/L — ABNORMAL LOW (ref 135–145)
Total Bilirubin: 0.1 mg/dL — ABNORMAL LOW (ref 0.3–1.2)
Total Protein: 5.6 g/dL — ABNORMAL LOW (ref 6.0–8.3)

## 2012-10-22 LAB — CBC
HCT: 36.3 % (ref 36.0–46.0)
MCH: 21.9 pg — ABNORMAL LOW (ref 26.0–34.0)
MCHC: 30.6 g/dL (ref 30.0–36.0)
MCV: 71.6 fL — ABNORMAL LOW (ref 78.0–100.0)
Platelets: 154 10*3/uL (ref 150–400)
RDW: 14.4 % (ref 11.5–15.5)
WBC: 10.8 10*3/uL — ABNORMAL HIGH (ref 4.0–10.5)

## 2012-10-22 LAB — GLUCOSE, CAPILLARY
Glucose-Capillary: 244 mg/dL — ABNORMAL HIGH (ref 70–99)
Glucose-Capillary: 215 mg/dL — ABNORMAL HIGH (ref 70–99)
Glucose-Capillary: 330 mg/dL — ABNORMAL HIGH (ref 70–99)

## 2012-10-22 MED ORDER — BLOOD GLUCOSE METER KIT: PACK | Status: DC

## 2012-10-22 MED ORDER — ALBUTEROL SULFATE (5 MG/ML) 0.5% IN NEBU
INHALATION_SOLUTION | RESPIRATORY_TRACT | Status: AC
  Filled 2012-10-22: qty 0.5

## 2012-10-22 MED ORDER — POTASSIUM CHLORIDE CRYS ER 20 MEQ PO TBCR
40.0000 meq | EXTENDED_RELEASE_TABLET | Freq: Once | ORAL | Status: AC
  Administered 2012-10-22: 40 meq via ORAL
  Filled 2012-10-22: qty 2

## 2012-10-22 MED ORDER — LIVING WELL WITH DIABETES BOOK
Freq: Once | Status: AC
  Administered 2012-10-22: 1
  Filled 2012-10-22: qty 1

## 2012-10-22 MED ORDER — INSULIN GLARGINE 100 UNIT/ML ~~LOC~~ SOLN: 30.0000 [IU] | Freq: Every day | SUBCUTANEOUS | Status: DC

## 2012-10-22 MED ORDER — INSULIN GLARGINE 100 UNIT/ML ~~LOC~~ SOLN: 35.0000 [IU] | Freq: Every day | SUBCUTANEOUS | Status: DC

## 2012-10-22 NOTE — Progress Notes (Signed)
Asked to see pt about medication assistance and glucometer. I met with pt at bedside, she stated she gets all her prescriptions at a [facility] clinic. She has been given a prescription for a glucometer plans on buying one from [place]. I advised her to call [facility] and find out the cost that way she is prepared when she goes. No other needs at this time.  Algernon Huxley RN BSN   [phone]

## 2012-10-22 NOTE — Discharge Summary (Addendum)
Physician Discharge Summary  Mary [name] WUJ:[id number] DOB: [birth year] DOA: 10/20/2012  PCP: Dartha Lodge, FNP  Admit date: 10/20/2012 Discharge date: 10/22/2012  Recommendations for Outpatient Follow-up:  1. Follow up with PCP within 1 week of discharge to review ACHS fingersticks and to make adjustments to medications/insulin as needed.  Consider utility of oral antihyperglycemic agents in the setting of DKA.    Discharge Diagnoses:  Principal Problem:   DKA, type 1 Active Problems:   Cirrhosis of liver   Non compliance w medication regimen   Bipolar disorder   DM (diabetes mellitus), type 1, uncontrolled   Abdominal pain   Hyponatremia   Discharge Condition: stable, improved  Diet recommendation: diabetic   Wt Readings from Last 3 Encounters:  10/22/12 54.1 kg (119 lb 4.3 oz)  06/22/12 52.209 kg (115 lb 1.6 oz)  03/16/12 51.5 kg (113 lb 8.6 oz)    History of present illness:   Mary [name] is a 42 y.o. female with history of multiple medical problems as listed below including diabetes mellitus type 1 with prior admissions for DKA who presents with nausea vomiting. She states that for the past one week she has had a nausea or vomiting but it worsened today and she had multiple episodes-nonbloody. She also reports diffuse abdominal pain. She denies cough, chest pain, dysuria, diarrhea, melena and no hematochezia. She was seen in the ED P. and blood glucose was 534 and bmet revealed an anion gap acidosis. CT scan of the abdomen and pelvis was done which showed abundant stool in the right colon and cecum, otherwise negative. She was started on IV insulin her glucostabilizer and is admitted for further evaluation and management.    Hospital Course:  Mary [name] was admitted with DKA due to medication noncompliance and was started on an insulin gtt.  She was transitioned to subcutaneous insulin 30 units and her gap remained closed and her bicarb remained > 20.  She was also given low  dose SSI, however, she states that with her busy lifestyle, her primary care doctor has been managing her diabetes with lantus for now.  She should check her fingersticks qACHS and record them and take them with her to her appointment.  If her AM fingerstick is > 200, she should call her primary care doctor to get instructions on how to increase her lantus prior to her follow up appointment.  Her nausea and abdominal pain resolved with resolution of her DKA.    Procedures:  Insulin gtt  Consultations:  Diabetes Educator  Discharge Exam: Filed Vitals:   10/22/12 0800  BP:   Pulse:   Temp: 98.6 F (37 C)  Resp:    Filed Vitals:   10/22/12 0000 10/22/12 0400 10/22/12 0631 10/22/12 0800  BP: 124/77 129/59    Pulse: 62 61    Temp: 98.1 F (36.7 C)   98.6 F (37 C)  TempSrc:    Oral  Resp: 17 12    Height:      Weight:   54.1 kg (119 lb 4.3 oz)   SpO2: 100% 100%      General: Thin AAF, No acute distress  HEENT: NCAT, MMM  Cardiovascular: RRR, nl S1, S2 no mrg, 2+ pulses, warm extremities. Tele:  NSR.  Respiratory: CTAB, no increased WOB  Abdomen: NABS, soft, NT/ND  MSK: Normal tone and bulk, no LEE  Neuro: Grossly intact   Discharge Instructions      Discharge Orders   Future Orders Complete By Expires  Call MD for:  difficulty breathing, headache or visual disturbances  As directed     Call MD for:  extreme fatigue  As directed     Call MD for:  hives  As directed     Call MD for:  persistant dizziness or light-headedness  As directed     Call MD for:  persistant nausea and vomiting  As directed     Call MD for:  severe uncontrolled pain  As directed     Call MD for:  temperature >100.4  As directed     Diet Carb Modified  As directed     Discharge instructions  As directed     Comments:      You were hospitalized with abdominal pain and nausea due to diabetic ketoacidosis which results from not taking your long-acting insulin.  Please make sure to take your  insulin every evening as prescribed, even when you feel ill.  Please make sure you get a new glucometer, the reli-on is the least expensive from [employer].  Check your fingersticks before breakfast, lunch, dinner, and bedtime.  If your MORNING fingerstick is greater than 200, please call your primary care doctor to get instructions on how to increase your insulin.  If any of your other fingersticks are > 300, please call your doctor for further instructions.  Please increase your lantus insulin to 35 units this evening.    Increase activity slowly  As directed         Medication List    TAKE these medications       aspirin EC 81 MG tablet  Take 81 mg by mouth daily.     calcium carbonate 500 MG chewable tablet  Commonly known as:  TUMS - dosed in mg elemental calcium  Chew 2 tablets by mouth daily as needed. For heartburn     gabapentin 300 MG capsule  Commonly known as:  NEURONTIN  Take 300 mg by mouth 2 (two) times daily.     insulin glargine 100 UNIT/ML injection  Commonly known as:  LANTUS  Inject 0.35 mLs (35 Units total) into the skin at bedtime.     metFORMIN 500 MG tablet  Commonly known as:  GLUCOPHAGE  Take 500 mg by mouth at bedtime.     ranitidine 150 MG capsule  Commonly known as:  ZANTAC  Take 150 mg by mouth 2 (two) times daily as needed. For heartburn     risperidone 4 MG tablet  Commonly known as:  RISPERDAL  Take 4 mg by mouth daily.     traZODone 150 MG tablet  Commonly known as:  DESYREL  Take 150 mg by mouth at bedtime.       Follow-up Information   Follow up with STEELE,ANTHONY, FNP. Schedule an appointment as soon as possible for a visit in 1 week.       The results of significant diagnostics from this hospitalization (including imaging, microbiology, ancillary and laboratory) are listed below for reference.    Significant Diagnostic Studies: Dg Chest 2 View  09/29/2012  *RADIOLOGY REPORT*  Clinical Data: Hyperglycemia.  CHEST - 2 VIEW   Comparison: Chest x-ray dated 07/28/2011 and 03/13/2011  Findings: Heart size and vascularity are normal and the lungs are clear except for emphysematous blebs at the lung apices.  Moderate thoracolumbar scoliosis, unchanged.  IMPRESSION: No acute disease.  Emphysematous changes at the apices.   Original Report Authenticated By: Francene Boyers, M.D.    Ct Abdomen Pelvis W  Contrast  10/20/2012  *RADIOLOGY REPORT*  Clinical Data: Abdominal pain, weight loss  CT ABDOMEN AND PELVIS WITH CONTRAST  Technique:  Multidetector CT imaging of the abdomen and pelvis was performed following the standard protocol during bolus administration of intravenous contrast.  Contrast: OMNIPAQUE IOHEXOL 300 MG/ML  SOLN  Comparison: 09/12/2011  Findings: Levoscoliosis noted lumbar spine.  Lung bases are unremarkable.  Small hiatal hernia.  Liver, spleen, pancreas and adrenals are unremarkable.  Significant stool noted in the right colon.  There is a low-lying cecum.  Stool noted within cecum.  Normal appendix is clearly visualized axial image 67.  No aortic aneurysm.  No small bowel obstruction.  No ascites or free air.  Kidneys are symmetrical in size and enhancement.  No hydronephrosis or hydroureter.  Delayed renal images shows bilateral renal symmetrical excretion.  Nonspecific fluid is noted within uterus. No adnexal mass is noted.  No distal colonic obstruction.  IMPRESSION:  1.  Abundant stool noted in the right colon and cecum.  No pericecal inflammation.  Normal appendix. 2.  There is a low-lying cecum. 3.  No hydronephrosis or hydroureter. 4.  No small bowel obstruction. 5.  Nonspecific fluid within uterus. 6.  Small hiatal hernia.   Original Report Authenticated By: Natasha Mead, M.D.     Microbiology: Recent Results (from the past 240 hour(s))  MRSA PCR SCREENING     Status: None   Collection Time    10/20/12  5:00 PM      Result Value Range Status   MRSA by PCR NEGATIVE  NEGATIVE Final   Comment:            The  GeneXpert MRSA Assay (FDA     approved for NASAL specimens     only), is one component of a     comprehensive MRSA colonization     surveillance program. It is not     intended to diagnose MRSA     infection nor to guide or     monitor treatment for     MRSA infections.     Labs: Basic Metabolic Panel:  Recent Labs Lab 10/21/12 1000 10/21/12 1409 10/21/12 2000 10/21/12 2220 10/22/12 0400  NA 139 138 137 136 133*  K 3.9 3.3* 3.3* 3.3* 3.0*  CL 103 106 106 106 102  CO2 19 23 23 22 23   GLUCOSE 317* 184* 199* 255* 165*  BUN 8 8 8 9 10   CREATININE 0.49* 0.43* 0.49* 0.50 0.46*  CALCIUM 8.6 8.4 8.5 8.6 8.5   Liver Function Tests:  Recent Labs Lab 10/20/12 0855 10/22/12 0400  AST 52* 61*  ALT 53* 48*  ALKPHOS 84 62  BILITOT 0.2* 0.1*  PROT 7.5 5.6*  ALBUMIN 3.6 2.6*    Recent Labs Lab 10/20/12 0855  LIPASE 46   No results found for this basename: AMMONIA,  in the last 168 hours CBC:  Recent Labs Lab 10/20/12 0800 10/20/12 1857 10/22/12 0400  WBC 8.3 10.3 10.8*  NEUTROABS 4.6  --   --   HGB 13.1 11.7* 11.1*  HCT 41.3 36.5 36.3  MCV 71.0* 70.6* 71.6*  PLT 188 198 154   Cardiac Enzymes:  Recent Labs Lab 10/20/12 1857 10/21/12 0050 10/21/12 0545  TROPONINI <0.30 <0.30 <0.30   BNP: BNP (last 3 results) No results found for this basename: PROBNP,  in the last 8760 hours CBG:  Recent Labs Lab 10/21/12 1834 10/21/12 1947 10/21/12 2216 10/22/12 0247 10/22/12 0732  GLUCAP 97 185* 211*  215* 163*    Time coordinating discharge: 45 minutes  Signed:  Eara Burruel  [facility] 10/22/2012, 8:32 AM

## 2012-10-22 NOTE — Clinical Social Work Psychosocial (Signed)
Clinical Social Work Department BRIEF PSYCHOSOCIAL ASSESSMENT 10/22/2012  Patient:  Mary [name], Mary [name]     Account Number:  [id number]     Admit date:  10/20/2012  Clinical Social Worker:  Jodelle Red  Date/Time:  10/22/2012 11:00 AM  Referred by:  RN  Date Referred:  10/21/2012 Referred for  Homelessness  Transportation assistance   Other Referral:   return to shelter, bus pass   Interview type:  Patient Other interview type:   Feliberto Harts [employer] RN at [employer] [phone]    PSYCHOSOCIAL DATA Living Status:  OTHER Admitted from facility:   Level of care:   Primary support name:  Feliberto Harts Primary support relationship to patient:  HOME HEALTH NURSE Degree of support available:   adequate, good resources/support in community    CURRENT CONCERNS Current Concerns  Other - See comment   Other Concerns:   return to shelter, bus pass    SOCIAL WORK ASSESSMENT / PLAN CSW met with Pt to assess needs due to residing at shelter. Pt lives at the [facility] at the [facility]. She has room there and needed help with [place] pass home. Pt has many resources in place to help her and is aware of how to seek asst as needed.   Assessment/plan status:  Referral to [facility] Other assessment/ plan:   RN CM to check on glucometer resources  bus pass home   Information/referral to community resources:    PATIENT'S/FAMILY'S RESPONSE TO PLAN OF CARE: Pt eager to return "home". Very aware of asst in community. CSW left VM for RN, Feliberto Harts to follow up with Pt after d/c. RN CM saw as well. CSW signing off. Pt denied other needs or concerns.    Doreen Salvage, LCSW ICU/Stepdown Clinical Social Worker [facility] Cell [phone] Hours 8am-1200pm M-F

## 2012-10-22 NOTE — Progress Notes (Signed)
Discharged patient to home.  Patient met with social work before discharge.  Provided patient education verbally and handout about diabetes and follow up appointment.  Alert and oriented.  No complaints.  Pushed down in wheelchair.

## 2012-11-15 ENCOUNTER — Emergency Department (HOSPITAL_COMMUNITY)
Admission: EM | Admit: 2012-11-15 | Discharge: 2012-11-15 | Disposition: A | Discharge status: Home / Self Care | Payer: Medicaid Other | Attending: Emergency Medicine | Admitting: Emergency Medicine

## 2012-11-15 ENCOUNTER — Encounter (HOSPITAL_COMMUNITY): Payer: Self-pay | Admitting: *Deleted

## 2012-11-15 ENCOUNTER — Emergency Department (HOSPITAL_COMMUNITY): Payer: Medicaid Other

## 2012-11-15 DIAGNOSIS — F319 Bipolar disorder, unspecified: Secondary | ICD-10-CM | POA: Diagnosis not present

## 2012-11-15 DIAGNOSIS — Z8719 Personal history of other diseases of the digestive system: Secondary | ICD-10-CM | POA: Insufficient documentation

## 2012-11-15 DIAGNOSIS — R011 Cardiac murmur, unspecified: Secondary | ICD-10-CM | POA: Diagnosis not present

## 2012-11-15 DIAGNOSIS — G40909 Epilepsy, unspecified, not intractable, without status epilepticus: Secondary | ICD-10-CM | POA: Diagnosis not present

## 2012-11-15 DIAGNOSIS — I739 Peripheral vascular disease, unspecified: Secondary | ICD-10-CM | POA: Insufficient documentation

## 2012-11-15 DIAGNOSIS — Z87442 Personal history of urinary calculi: Secondary | ICD-10-CM | POA: Insufficient documentation

## 2012-11-15 DIAGNOSIS — E119 Type 2 diabetes mellitus without complications: Secondary | ICD-10-CM | POA: Insufficient documentation

## 2012-11-15 DIAGNOSIS — F17200 Nicotine dependence, unspecified, uncomplicated: Secondary | ICD-10-CM | POA: Diagnosis not present

## 2012-11-15 DIAGNOSIS — R1084 Generalized abdominal pain: Secondary | ICD-10-CM | POA: Insufficient documentation

## 2012-11-15 DIAGNOSIS — F4310 Post-traumatic stress disorder, unspecified: Secondary | ICD-10-CM | POA: Diagnosis not present

## 2012-11-15 DIAGNOSIS — R1110 Vomiting, unspecified: Secondary | ICD-10-CM

## 2012-11-15 DIAGNOSIS — Z9889 Other specified postprocedural states: Secondary | ICD-10-CM | POA: Insufficient documentation

## 2012-11-15 DIAGNOSIS — Z8669 Personal history of other diseases of the nervous system and sense organs: Secondary | ICD-10-CM | POA: Insufficient documentation

## 2012-11-15 DIAGNOSIS — F411 Generalized anxiety disorder: Secondary | ICD-10-CM | POA: Insufficient documentation

## 2012-11-15 DIAGNOSIS — Z794 Long term (current) use of insulin: Secondary | ICD-10-CM | POA: Insufficient documentation

## 2012-11-15 DIAGNOSIS — Z8619 Personal history of other infectious and parasitic diseases: Secondary | ICD-10-CM | POA: Insufficient documentation

## 2012-11-15 DIAGNOSIS — Z7982 Long term (current) use of aspirin: Secondary | ICD-10-CM | POA: Insufficient documentation

## 2012-11-15 DIAGNOSIS — Z8739 Personal history of other diseases of the musculoskeletal system and connective tissue: Secondary | ICD-10-CM | POA: Insufficient documentation

## 2012-11-15 DIAGNOSIS — Z3202 Encounter for pregnancy test, result negative: Secondary | ICD-10-CM | POA: Insufficient documentation

## 2012-11-15 DIAGNOSIS — R112 Nausea with vomiting, unspecified: Secondary | ICD-10-CM | POA: Diagnosis present

## 2012-11-15 DIAGNOSIS — Z79899 Other long term (current) drug therapy: Secondary | ICD-10-CM | POA: Insufficient documentation

## 2012-11-15 DIAGNOSIS — R109 Unspecified abdominal pain: Secondary | ICD-10-CM

## 2012-11-15 DIAGNOSIS — Z862 Personal history of diseases of the blood and blood-forming organs and certain disorders involving the immune mechanism: Secondary | ICD-10-CM | POA: Insufficient documentation

## 2012-11-15 DIAGNOSIS — Z8679 Personal history of other diseases of the circulatory system: Secondary | ICD-10-CM | POA: Insufficient documentation

## 2012-11-15 DIAGNOSIS — Z8709 Personal history of other diseases of the respiratory system: Secondary | ICD-10-CM | POA: Insufficient documentation

## 2012-11-15 LAB — COMPREHENSIVE METABOLIC PANEL
Albumin: 3.8 g/dL (ref 3.5–5.2)
Alkaline Phosphatase: 111 U/L (ref 39–117)
BUN: 13 mg/dL (ref 6–23)
Calcium: 10.1 mg/dL (ref 8.4–10.5)
Potassium: 5.1 mEq/L (ref 3.5–5.1)
Sodium: 140 mEq/L (ref 135–145)
Total Protein: 8.3 g/dL (ref 6.0–8.3)

## 2012-11-15 LAB — URINALYSIS, ROUTINE W REFLEX MICROSCOPIC
Bilirubin Urine: NEGATIVE
Leukocytes, UA: NEGATIVE
Nitrite: NEGATIVE
Specific Gravity, Urine: 1.038 — ABNORMAL HIGH (ref 1.005–1.030)
Urobilinogen, UA: 0.2 mg/dL (ref 0.0–1.0)
pH: 6 (ref 5.0–8.0)

## 2012-11-15 LAB — URINE MICROSCOPIC-ADD ON

## 2012-11-15 LAB — GLUCOSE, CAPILLARY: Glucose-Capillary: 331 mg/dL — ABNORMAL HIGH (ref 70–99)

## 2012-11-15 LAB — CBC WITH DIFFERENTIAL/PLATELET
Basophils Absolute: 0 10*3/uL (ref 0.0–0.1)
Eosinophils Absolute: 0 10*3/uL (ref 0.0–0.7)
Lymphocytes Relative: 14 % (ref 12–46)
MCHC: 31.2 g/dL (ref 30.0–36.0)
Monocytes Absolute: 0.2 10*3/uL (ref 0.1–1.0)
Neutro Abs: 9.8 10*3/uL — ABNORMAL HIGH (ref 1.7–7.7)
Neutrophils Relative %: 84 % — ABNORMAL HIGH (ref 43–77)
RDW: 15 % (ref 11.5–15.5)

## 2012-11-15 LAB — LIPASE, BLOOD: Lipase: 25 U/L (ref 11–59)

## 2012-11-15 LAB — PREGNANCY, URINE: Preg Test, Ur: NEGATIVE

## 2012-11-15 MED ORDER — ONDANSETRON 4 MG PO TBDP
4.0000 mg | ORAL_TABLET | Freq: Once | ORAL | Status: AC
  Administered 2012-11-15: 4 mg via ORAL
  Filled 2012-11-15: qty 1

## 2012-11-15 MED ORDER — SODIUM CHLORIDE 0.9 % IV BOLUS (SEPSIS)
2000.0000 mL | Freq: Once | INTRAVENOUS | Status: AC
  Administered 2012-11-15: 2000 mL via INTRAVENOUS

## 2012-11-15 MED ORDER — PROMETHAZINE HCL 25 MG PO TABS: 25.0000 mg | ORAL_TABLET | Freq: Four times a day (QID) | ORAL | Status: DC | PRN

## 2012-11-15 MED ORDER — MORPHINE SULFATE 4 MG/ML IJ SOLN
4.0000 mg | Freq: Once | INTRAMUSCULAR | Status: AC
  Administered 2012-11-15: 4 mg via INTRAVENOUS
  Filled 2012-11-15: qty 1

## 2012-11-15 MED ORDER — ONDANSETRON HCL 4 MG/2ML IJ SOLN
4.0000 mg | Freq: Once | INTRAMUSCULAR | Status: AC
  Administered 2012-11-15: 4 mg via INTRAVENOUS
  Filled 2012-11-15: qty 2

## 2012-11-15 NOTE — ED Notes (Signed)
Pt gagging during discharge, stated 10/10 pain to abdomen.  Spoke with EDP stated to give 4mg  ODT Zofran and discharge pt.

## 2012-11-15 NOTE — ED Notes (Signed)
[id number]<AV> Expected date:<BR> Expected time:<BR> Means of arrival:<BR> Comments:<BR> NV

## 2012-11-15 NOTE — ED Notes (Signed)
Pt c/o N/V w/ ABD pain that started today.

## 2012-11-15 NOTE — ED Notes (Signed)
Pt sleeping in lobby, states she does not have a ride and is sleeping in lobby till bus runs tomorrow, Equities trader approached pt to explain she needs to find a ride tonight. RN tried to contact mother, next of kin in chart. No answer. pt given red gripper socks for safety. Security dealing with pt regarding leaving facility.

## 2012-11-15 NOTE — ED Provider Notes (Signed)
History     CSN: [id number]  Arrival date & time 11/15/12  1452   First MD Initiated Contact with Patient 11/15/12 1539      Chief Complaint  Patient presents with  . Nausea  . Emesis  . Abdominal Pain    (Consider location/radiation/quality/duration/timing/severity/associated sxs/prior treatment) The history is provided by the patient. The history is limited by the condition of the patient.  Mary [name] is a 42 y.o. female hx of DM with medication uncompliance, seizure, PTSD here with ab pain. She ate breakfast this morning and afterwards had diffuse abdominal pain. She also vomited several times and denies any diarrhea. No fevers or chills or urinary symptoms. She is tearful during the exam. Upon chart review she was recently admitted for DKA and abdominal pain and CT abdomen pelvis was normal. She came into the ER frequently for abdominal pain and workup has so far been negative.    Level V caveat- patient in extreme pain   Past Medical History  Diagnosis Date  . Diabetes mellitus     diagnosed in 1996-always been on insulin  . Kidney stone   . Seizure disorder     started with pregnancy of first son  . Gastritis   . Bipolar 2 disorder   . Post traumatic stress disorder     "flipping out" after people close to her died  . DKA (diabetic ketoacidoses)     Recurrent admissions for DKA, medication non-complaince, poor social situation  . Cirrhosis   . Angina   . Heart murmur   . Shortness of breath     occassionally  . Seizures     pt states 60yrs ago  . Headache(784.0)     occassionally  . Arthritis     r ankle-S/P surgery Nov 02, 2005)  . Anxiety   . Peripheral vascular disease     numbness bilaterally feet  . Anemia     childhood  . H/O hiatal hernia     2 yrs ago  . Depression     childhood  . Hep C w/o coma, chronic     biopsy in 2010-no rx-was supposed to see a hepatologist in [place]  . Bipolar disorder   . PTSD (post-traumatic stress disorder)   .  Neuropathy     in legs and feet and hands    Past Surgical History  Procedure Laterality Date  . Ankle surgery    . Cesarean section    . Endometrial biopsy  06/22/2012    Family History  Problem Relation Age of Onset  . Diabetes Mother     currently 59  . Fibromyalgia Mother   . Cirrhosis Father     died in 1997/11/02    History  Substance Use Topics  . Smoking status: Current Every Day Smoker -- 0.10 packs/day for 25 years    Types: Cigarettes  . Smokeless tobacco: Never Used  . Alcohol Use: No     Comment: Endorses hasn't been drinking    OB History   Grav Para Term Preterm Abortions TAB SAB Ect Mult Living   4 3 3  0 1 0 1 0 1 2      Review of Systems  Gastrointestinal: Positive for vomiting and abdominal pain.  All other systems reviewed and are negative.    Allergies  Penicillins  Home Medications   Current Outpatient Rx  Name  Route  Sig  Dispense  Refill  . aspirin EC 81 MG tablet   Oral  Take 81 mg by mouth daily.          . calcium carbonate (TUMS - DOSED IN MG ELEMENTAL CALCIUM) 500 MG chewable tablet   Oral   Chew 2 tablets by mouth daily as needed. For heartburn         . gabapentin (NEURONTIN) 300 MG capsule   Oral   Take 300 mg by mouth 2 (two) times daily.         . insulin glargine (LANTUS) 100 UNIT/ML injection   Subcutaneous   Inject 0.3 mLs (30 Units total) into the skin at bedtime.   10 mL   0   . metFORMIN (GLUCOPHAGE) 500 MG tablet   Oral   Take 500 mg by mouth at bedtime.         . ranitidine (ZANTAC) 150 MG capsule   Oral   Take 150 mg by mouth 2 (two) times daily as needed. For heartburn         . traZODone (DESYREL) 150 MG tablet   Oral   Take 150 mg by mouth at bedtime.         . Blood Glucose Monitoring Suppl (BLOOD GLUCOSE METER) kit      Use as instructed   1 each   0     Diabetes mellitus 250.00. Uncontrolled, Insulin de ...   . risperidone (RISPERDAL) 4 MG tablet   Oral   Take 4 mg by mouth  daily.           BP 134/89  Pulse 65  Temp(Src) 98.1 F (36.7 C) (Oral)  Resp 18  SpO2 100%  LMP 11/15/2012  Physical Exam  Nursing note and vitals reviewed. Constitutional: She is oriented to person, place, and time.  Tearful, slightly dehydrated   HENT:  Head: Normocephalic.  MM slightly dry   Eyes: Conjunctivae are normal. Pupils are equal, round, and reactive to light.  Neck: Normal range of motion. Neck supple.  Cardiovascular: Normal rate, regular rhythm and normal heart sounds.   Pulmonary/Chest: Effort normal and breath sounds normal. No respiratory distress. She has no wheezes. She has no rales.  Abdominal:  Mild diffuse tenderness, no rebound. No CVAT   Musculoskeletal: Normal range of motion.  Neurological: She is alert and oriented to person, place, and time.  Skin: Skin is warm and dry.  Psychiatric: She has a normal mood and affect. Her behavior is normal. Judgment and thought content normal.    ED Course  Procedures (including critical care time)  Labs Reviewed  CBC WITH DIFFERENTIAL - Abnormal; Notable for the following:    WBC 11.6 (*)    RBC 5.71 (*)    MCV 71.3 (*)    MCH 22.2 (*)    Neutrophils Relative % 84 (*)    Monocytes Relative 2 (*)    Neutro Abs 9.8 (*)    All other components within normal limits  COMPREHENSIVE METABOLIC PANEL - Abnormal; Notable for the following:    Glucose, Bld 363 (*)    Creatinine, Ser 0.42 (*)    AST 107 (*)    ALT 76 (*)    Total Bilirubin 0.2 (*)    All other components within normal limits  URINALYSIS, ROUTINE W REFLEX MICROSCOPIC - Abnormal; Notable for the following:    Specific Gravity, Urine 1.038 (*)    Glucose, UA >1000 (*)    Hgb urine dipstick LARGE (*)    Ketones, ur 40 (*)    All other components within  normal limits  GLUCOSE, CAPILLARY - Abnormal; Notable for the following:    Glucose-Capillary 331 (*)    All other components within normal limits  LIPASE, BLOOD  PREGNANCY, URINE  URINE  MICROSCOPIC-ADD ON   Dg Abd Acute W/chest  11/15/2012   *RADIOLOGY REPORT*  Clinical Data: Abdominal pain.  Question small bowel obstruction.  ACUTE ABDOMEN SERIES (ABDOMEN 2 VIEW & CHEST 1 VIEW)  Comparison: Chest radiographs 09/29/2012.  Abdominal pelvic CT 10/20/2012.  Findings: The heart size and mediastinal contours are normal.  The lungs are clear and there is no pleural effusion or pneumothorax.  There is a thoracolumbar scoliosis.  The abdomen is nearly gasless. No distended bowel or free intraperitoneal air is identified. There is moderate stool within the rectum.  There are no suspicious abdominal calcifications.  IMPRESSION:  1.  Nearly gasless abdomen without evidence of bowel obstruction or free intraperitoneal air. 2.  No acute cardiopulmonary process. 3.  Thoracolumbar scoliosis.   Original Report Authenticated By: Carey Bullocks, M.D.     No diagnosis found.    MDM  ASHONTE ANGELUCCI is a 42 y.o. female here with abdominal pain. Will need to r/o pancreatitis vs DKA. But likely worsening of chronic abdominal pain. Will get labs and ab xray and reassess.   7:01 PM Xray showed no SBO. UA nl, CBG 331 with AG 16. I don't think she is in DKA. She has hyperglycemia from medication uncompliance. She is comfortably sleeping. She is also homeless. I called social work who recommend giving a list of resources and have her go to [employer]. Will give short course of phenergan for nausea.        Richardean Canal, MD 11/15/12 [phone]

## 2012-11-17 ENCOUNTER — Emergency Department (HOSPITAL_COMMUNITY)
Admission: EM | Admit: 2012-11-17 | Discharge: 2012-11-17 | Disposition: A | Discharge status: Home / Self Care | Payer: Medicaid Other | Attending: Emergency Medicine | Admitting: Emergency Medicine

## 2012-11-17 ENCOUNTER — Emergency Department (HOSPITAL_COMMUNITY): Payer: Medicaid Other

## 2012-11-17 ENCOUNTER — Encounter (HOSPITAL_COMMUNITY): Payer: Self-pay | Admitting: *Deleted

## 2012-11-17 DIAGNOSIS — R109 Unspecified abdominal pain: Secondary | ICD-10-CM

## 2012-11-17 DIAGNOSIS — R011 Cardiac murmur, unspecified: Secondary | ICD-10-CM | POA: Insufficient documentation

## 2012-11-17 DIAGNOSIS — Z7982 Long term (current) use of aspirin: Secondary | ICD-10-CM | POA: Diagnosis not present

## 2012-11-17 DIAGNOSIS — F319 Bipolar disorder, unspecified: Secondary | ICD-10-CM | POA: Insufficient documentation

## 2012-11-17 DIAGNOSIS — Z8669 Personal history of other diseases of the nervous system and sense organs: Secondary | ICD-10-CM | POA: Diagnosis not present

## 2012-11-17 DIAGNOSIS — F411 Generalized anxiety disorder: Secondary | ICD-10-CM | POA: Insufficient documentation

## 2012-11-17 DIAGNOSIS — Z8659 Personal history of other mental and behavioral disorders: Secondary | ICD-10-CM | POA: Diagnosis not present

## 2012-11-17 DIAGNOSIS — Z79899 Other long term (current) drug therapy: Secondary | ICD-10-CM | POA: Insufficient documentation

## 2012-11-17 DIAGNOSIS — Z8719 Personal history of other diseases of the digestive system: Secondary | ICD-10-CM | POA: Diagnosis not present

## 2012-11-17 DIAGNOSIS — E1110 Type 2 diabetes mellitus with ketoacidosis without coma: Secondary | ICD-10-CM | POA: Diagnosis not present

## 2012-11-17 DIAGNOSIS — Z862 Personal history of diseases of the blood and blood-forming organs and certain disorders involving the immune mechanism: Secondary | ICD-10-CM | POA: Diagnosis not present

## 2012-11-17 DIAGNOSIS — G40909 Epilepsy, unspecified, not intractable, without status epilepticus: Secondary | ICD-10-CM | POA: Insufficient documentation

## 2012-11-17 DIAGNOSIS — M19079 Primary osteoarthritis, unspecified ankle and foot: Secondary | ICD-10-CM | POA: Diagnosis not present

## 2012-11-17 DIAGNOSIS — Z880 Allergy status to penicillin: Secondary | ICD-10-CM | POA: Diagnosis not present

## 2012-11-17 DIAGNOSIS — Z87442 Personal history of urinary calculi: Secondary | ICD-10-CM | POA: Insufficient documentation

## 2012-11-17 DIAGNOSIS — Z8679 Personal history of other diseases of the circulatory system: Secondary | ICD-10-CM | POA: Diagnosis not present

## 2012-11-17 DIAGNOSIS — Z8619 Personal history of other infectious and parasitic diseases: Secondary | ICD-10-CM | POA: Insufficient documentation

## 2012-11-17 DIAGNOSIS — F17200 Nicotine dependence, unspecified, uncomplicated: Secondary | ICD-10-CM | POA: Diagnosis not present

## 2012-11-17 LAB — URINALYSIS, ROUTINE W REFLEX MICROSCOPIC
Glucose, UA: >1000 mg/dL — AB
Ketones, ur: 40 mg/dL — AB
Leukocytes, UA: NEGATIVE
Nitrite: NEGATIVE
Protein, ur: NEGATIVE mg/dL

## 2012-11-17 LAB — CBC WITH DIFFERENTIAL/PLATELET
Basophils Absolute: 0 10*3/uL (ref 0.0–0.1)
Basophils Relative: 0 % (ref 0–1)
Eosinophils Absolute: 0.1 10*3/uL (ref 0.0–0.7)
HCT: 43.1 % (ref 36.0–46.0)
Hemoglobin: 13.8 g/dL (ref 12.0–15.0)
Lymphocytes Relative: 26 % (ref 12–46)
Lymphs Abs: 3 10*3/uL (ref 0.7–4.0)
MCH: 22.8 pg — ABNORMAL LOW (ref 26.0–34.0)
MCHC: 32 g/dL (ref 30.0–36.0)
MCV: 71.4 fL — ABNORMAL LOW (ref 78.0–100.0)
Monocytes Absolute: 0.6 10*3/uL (ref 0.1–1.0)
Neutro Abs: 7.9 10*3/uL — ABNORMAL HIGH (ref 1.7–7.7)
RDW: 15.4 % (ref 11.5–15.5)

## 2012-11-17 LAB — LIPASE, BLOOD: Lipase: 16 U/L (ref 11–59)

## 2012-11-17 LAB — COMPREHENSIVE METABOLIC PANEL
ALT: 66 U/L — ABNORMAL HIGH (ref 0–35)
Albumin: 3.7 g/dL (ref 3.5–5.2)
Alkaline Phosphatase: 90 U/L (ref 39–117)
BUN: 25 mg/dL — ABNORMAL HIGH (ref 6–23)
Potassium: 4.1 mEq/L (ref 3.5–5.1)
Sodium: 140 mEq/L (ref 135–145)
Total Protein: 7.6 g/dL (ref 6.0–8.3)

## 2012-11-17 LAB — URINE MICROSCOPIC-ADD ON

## 2012-11-17 MED ORDER — HYDROCODONE-ACETAMINOPHEN 5-325 MG PO TABS: 2.0000 | ORAL_TABLET | ORAL | Status: DC | PRN

## 2012-11-17 MED ORDER — ONDANSETRON HCL 4 MG/2ML IJ SOLN
4.0000 mg | Freq: Once | INTRAMUSCULAR | Status: AC
  Administered 2012-11-17: 4 mg via INTRAVENOUS
  Filled 2012-11-17: qty 2

## 2012-11-17 MED ORDER — SODIUM CHLORIDE 0.9 % IV SOLN
INTRAVENOUS | Status: DC
  Administered 2012-11-17: 08:00:00 via INTRAVENOUS

## 2012-11-17 MED ORDER — MORPHINE SULFATE 4 MG/ML IJ SOLN
4.0000 mg | Freq: Once | INTRAMUSCULAR | Status: AC
  Administered 2012-11-17: 4 mg via INTRAVENOUS
  Filled 2012-11-17: qty 1

## 2012-11-17 MED ORDER — PROMETHAZINE HCL 25 MG PO TABS: 25.0000 mg | ORAL_TABLET | Freq: Four times a day (QID) | ORAL | Status: DC | PRN

## 2012-11-17 NOTE — ED Notes (Signed)
Per EMS, pt from [facility].  C/O abdominal pain.  Pt left here Monday morning and pain has progressively gotten worst.  No v/d mentioned.  Pt has hx of DM.  Pt would not verbalize answers to many questions in route.  Stated too painful to talk.  cbg 146 in route.  Vitals: 118/86, hr 90, resp 20.

## 2012-11-17 NOTE — Progress Notes (Signed)
[facility] has seen patient and provided her with a list of primary care resources. °

## 2012-11-17 NOTE — ED Notes (Signed)
WUJ:[id number]<JY> Expected date:<BR> Expected time:<BR> Means of arrival:<BR> Comments:<BR> EMS 41yo F; abd pain, seen Sunday for same

## 2012-11-17 NOTE — ED Provider Notes (Signed)
History     CSN: [id number]  Arrival date & time 11/17/12  2130   First MD Initiated Contact with Patient 11/17/12 [phone]      Chief Complaint  Patient presents with  . Abdominal Pain    (Consider location/radiation/quality/duration/timing/severity/associated sxs/prior treatment) HPI Comments: Patient presents once again for abdominal pain. Patient was seen 2 days ago for the same. She has had frequent visits for similar complaints. Patient complaining of diffuse right-sided abdominal pain which is sharp, stabbing and severe. There has not been vomiting or diarrhea. Patient has brought the discharge summary from her visit 2 days ago, prescriptions still attached.  Patient is a 42 y.o. female presenting with abdominal pain.  Abdominal Pain Associated symptoms include abdominal pain.    Past Medical History  Diagnosis Date  . Diabetes mellitus     diagnosed in 1996-always been on insulin  . Kidney stone   . Seizure disorder     started with pregnancy of first son  . Gastritis   . Bipolar 2 disorder   . Post traumatic stress disorder     "flipping out" after people close to her died  . DKA (diabetic ketoacidoses)     Recurrent admissions for DKA, medication non-complaince, poor social situation  . Cirrhosis   . Angina   . Heart murmur   . Shortness of breath     occassionally  . Seizures     pt states 53yrs ago  . Headache(784.0)     occassionally  . Arthritis     r ankle-S/P surgery 11-Nov-2005)  . Anxiety   . Peripheral vascular disease     numbness bilaterally feet  . Anemia     childhood  . H/O hiatal hernia     2 yrs ago  . Depression     childhood  . Hep C w/o coma, chronic     biopsy in 2010-no rx-was supposed to see a hepatologist in [place]  . Bipolar disorder   . PTSD (post-traumatic stress disorder)   . Neuropathy     in legs and feet and hands    Past Surgical History  Procedure Laterality Date  . Ankle surgery    . Cesarean section    .  Endometrial biopsy  06/22/2012    Family History  Problem Relation Age of Onset  . Diabetes Mother     currently 71  . Fibromyalgia Mother   . Cirrhosis Father     died in 1997-11-11    History  Substance Use Topics  . Smoking status: Current Every Day Smoker -- 0.10 packs/day for 25 years    Types: Cigarettes  . Smokeless tobacco: Never Used  . Alcohol Use: No     Comment: Endorses hasn't been drinking    OB History   Grav Para Term Preterm Abortions TAB SAB Ect Mult Living   4 3 3  0 1 0 1 0 1 2      Review of Systems  Constitutional: Negative for fever.  Gastrointestinal: Positive for abdominal pain.  All other systems reviewed and are negative.    Allergies  Penicillins  Home Medications   Current Outpatient Rx  Name  Route  Sig  Dispense  Refill  . aspirin EC 81 MG tablet   Oral   Take 81 mg by mouth daily.          . Blood Glucose Monitoring Suppl (BLOOD GLUCOSE METER) kit      Use as instructed   1 each  0     Diabetes mellitus 250.00. Uncontrolled, Insulin de ...   . calcium carbonate (TUMS - DOSED IN MG ELEMENTAL CALCIUM) 500 MG chewable tablet   Oral   Chew 2 tablets by mouth daily as needed. For heartburn         . gabapentin (NEURONTIN) 300 MG capsule   Oral   Take 300 mg by mouth 2 (two) times daily.         . insulin glargine (LANTUS) 100 UNIT/ML injection   Subcutaneous   Inject 0.3 mLs (30 Units total) into the skin at bedtime.   10 mL   0   . metFORMIN (GLUCOPHAGE) 500 MG tablet   Oral   Take 500 mg by mouth at bedtime.         . promethazine (PHENERGAN) 25 MG tablet   Oral   Take 1 tablet (25 mg total) by mouth every 6 (six) hours as needed for nausea.   15 tablet   0   . ranitidine (ZANTAC) 150 MG capsule   Oral   Take 150 mg by mouth 2 (two) times daily as needed. For heartburn         . risperidone (RISPERDAL) 4 MG tablet   Oral   Take 4 mg by mouth daily.         . traZODone (DESYREL) 150 MG tablet    Oral   Take 150 mg by mouth at bedtime.           BP 141/92  Pulse 79  Temp(Src) 98.5 F (36.9 C) (Oral)  Resp 18  SpO2 99%  LMP 11/15/2012  Physical Exam  Constitutional: She is oriented to person, place, and time. She appears well-developed and well-nourished. She appears distressed.  HENT:  Head: Normocephalic and atraumatic.  Right Ear: Hearing normal.  Left Ear: Hearing normal.  Nose: Nose normal.  Mouth/Throat: Oropharynx is clear and moist and mucous membranes are normal.  Eyes: Conjunctivae and EOM are normal. Pupils are equal, round, and reactive to light.  Neck: Normal range of motion. Neck supple.  Cardiovascular: Regular rhythm, S1 normal and S2 normal.  Exam reveals no gallop and no friction rub.   No murmur heard. Pulmonary/Chest: Effort normal and breath sounds normal. No respiratory distress. She exhibits no tenderness.  Abdominal: Soft. Normal appearance and bowel sounds are normal. There is no hepatosplenomegaly. There is generalized tenderness. There is no rebound, no guarding, no tenderness at McBurney's point and negative Murphy's sign. No hernia.  Musculoskeletal: Normal range of motion.  Neurological: She is alert and oriented to person, place, and time. She has normal strength. No cranial nerve deficit or sensory deficit. Coordination normal. GCS eye subscore is 4. GCS verbal subscore is 5. GCS motor subscore is 6.  Skin: Skin is warm, dry and intact. No rash noted. No cyanosis.  Psychiatric: She has a normal mood and affect. Her speech is normal and behavior is normal. Thought content normal.    ED Course  Procedures (including critical care time)  Labs Reviewed  CBC WITH DIFFERENTIAL  COMPREHENSIVE METABOLIC PANEL  URINALYSIS, ROUTINE W REFLEX MICROSCOPIC  LIPASE, BLOOD   Ct Abdomen Pelvis Wo Contrast  11/17/2012   *RADIOLOGY REPORT*  Clinical Data: Right flank pain.  CT ABDOMEN AND PELVIS WITHOUT CONTRAST  Technique:  Multidetector CT imaging of  the abdomen and pelvis was performed following the standard protocol without intravenous contrast.  Comparison: CT scan dated 10/20/2012 and radiographs dated 11/15/2012  Findings: There are  several small gallstones.  No dilated bile ducts.  Liver parenchyma, spleen, pancreas, adrenal glands, and kidneys are normal.  The bowel is normal including the terminal ileum and appendix.  Uterus and left ovary are normal.  There is a 6 mm area of slightly increased density in the right ovary which probably represents a tiny hemorrhagic cyst.  No acute osseous abnormality.  Moderate rotoscoliosis of the lumbar spine.  IMPRESSION:  1.  Multiple tiny gallstones. 2.  Probable tiny hemorrhagic cyst in the right ovary. 3.  Otherwise, normal exam.   Original Report Authenticated By: Francene Boyers, M.D.   Dg Abd Acute W/chest  11/15/2012   *RADIOLOGY REPORT*  Clinical Data: Abdominal pain.  Question small bowel obstruction.  ACUTE ABDOMEN SERIES (ABDOMEN 2 VIEW & CHEST 1 VIEW)  Comparison: Chest radiographs 09/29/2012.  Abdominal pelvic CT 10/20/2012.  Findings: The heart size and mediastinal contours are normal.  The lungs are clear and there is no pleural effusion or pneumothorax.  There is a thoracolumbar scoliosis.  The abdomen is nearly gasless. No distended bowel or free intraperitoneal air is identified. There is moderate stool within the rectum.  There are no suspicious abdominal calcifications.  IMPRESSION:  1.  Nearly gasless abdomen without evidence of bowel obstruction or free intraperitoneal air. 2.  No acute cardiopulmonary process. 3.  Thoracolumbar scoliosis.   Original Report Authenticated By: Carey Bullocks, M.D.       Diagnosis: Abdominal pain    MDM  Patient presents to the ER for evaluation of abdominal pain. Patient has been seen multiple times with similar pain in the past. Pain was right-sided and diffuse. She has improved greatly with IV medication. Repeat examination revealed that she was  sleeping comfortably, easy to arouse.  Otherwise unremarkable and consistent with patient's baseline. She has slightly elevated AST and ALT, but in light of her normal baseline. Normal bilirubin.  CAT scan was performed that shows small gallstones in the gallbladder but there is no evidence of acute cholecystitis. Patient's examination is not consistent with acute cholecystitis, negative Murphy sign. Laboratory also does not suggest cholecystitis.  Symptoms are most consistent with the patient's chronic pain syndrome that she has frequently presented with in the past. I cannot rule out biliary colic, but symptoms are resolved and she has no sign of cholecystitis.      Gilda Crease, MD 11/17/12 [phone]

## 2012-11-17 NOTE — ED Notes (Signed)
IV team to assess IV, unsure of infiltration

## 2012-11-18 LAB — URINE CULTURE: Colony Count: 4000

## 2012-11-19 ENCOUNTER — Emergency Department (HOSPITAL_COMMUNITY)
Admission: EM | Admit: 2012-11-19 | Discharge: 2012-11-19 | Disposition: A | Discharge status: Home / Self Care | Payer: Medicaid Other | Attending: Emergency Medicine | Admitting: Emergency Medicine

## 2012-11-19 ENCOUNTER — Encounter (HOSPITAL_COMMUNITY): Payer: Self-pay | Admitting: *Deleted

## 2012-11-19 ENCOUNTER — Emergency Department (HOSPITAL_COMMUNITY): Payer: Medicaid Other

## 2012-11-19 DIAGNOSIS — Z794 Long term (current) use of insulin: Secondary | ICD-10-CM | POA: Insufficient documentation

## 2012-11-19 DIAGNOSIS — F17200 Nicotine dependence, unspecified, uncomplicated: Secondary | ICD-10-CM | POA: Diagnosis not present

## 2012-11-19 DIAGNOSIS — K5900 Constipation, unspecified: Secondary | ICD-10-CM | POA: Insufficient documentation

## 2012-11-19 DIAGNOSIS — K8020 Calculus of gallbladder without cholecystitis without obstruction: Secondary | ICD-10-CM

## 2012-11-19 DIAGNOSIS — K8050 Calculus of bile duct without cholangitis or cholecystitis without obstruction: Secondary | ICD-10-CM

## 2012-11-19 DIAGNOSIS — Z880 Allergy status to penicillin: Secondary | ICD-10-CM | POA: Insufficient documentation

## 2012-11-19 DIAGNOSIS — R112 Nausea with vomiting, unspecified: Secondary | ICD-10-CM | POA: Insufficient documentation

## 2012-11-19 DIAGNOSIS — M19079 Primary osteoarthritis, unspecified ankle and foot: Secondary | ICD-10-CM | POA: Diagnosis not present

## 2012-11-19 DIAGNOSIS — F319 Bipolar disorder, unspecified: Secondary | ICD-10-CM | POA: Insufficient documentation

## 2012-11-19 DIAGNOSIS — Z862 Personal history of diseases of the blood and blood-forming organs and certain disorders involving the immune mechanism: Secondary | ICD-10-CM | POA: Insufficient documentation

## 2012-11-19 DIAGNOSIS — F411 Generalized anxiety disorder: Secondary | ICD-10-CM | POA: Insufficient documentation

## 2012-11-19 DIAGNOSIS — Z87442 Personal history of urinary calculi: Secondary | ICD-10-CM | POA: Insufficient documentation

## 2012-11-19 DIAGNOSIS — F4310 Post-traumatic stress disorder, unspecified: Secondary | ICD-10-CM | POA: Diagnosis not present

## 2012-11-19 DIAGNOSIS — Z79899 Other long term (current) drug therapy: Secondary | ICD-10-CM | POA: Insufficient documentation

## 2012-11-19 DIAGNOSIS — G40909 Epilepsy, unspecified, not intractable, without status epilepticus: Secondary | ICD-10-CM | POA: Diagnosis not present

## 2012-11-19 DIAGNOSIS — Z8719 Personal history of other diseases of the digestive system: Secondary | ICD-10-CM | POA: Insufficient documentation

## 2012-11-19 DIAGNOSIS — Z7982 Long term (current) use of aspirin: Secondary | ICD-10-CM | POA: Insufficient documentation

## 2012-11-19 DIAGNOSIS — E1110 Type 2 diabetes mellitus with ketoacidosis without coma: Secondary | ICD-10-CM | POA: Insufficient documentation

## 2012-11-19 DIAGNOSIS — R011 Cardiac murmur, unspecified: Secondary | ICD-10-CM | POA: Insufficient documentation

## 2012-11-19 DIAGNOSIS — R1031 Right lower quadrant pain: Secondary | ICD-10-CM | POA: Diagnosis present

## 2012-11-19 DIAGNOSIS — Z8679 Personal history of other diseases of the circulatory system: Secondary | ICD-10-CM | POA: Insufficient documentation

## 2012-11-19 DIAGNOSIS — Z8619 Personal history of other infectious and parasitic diseases: Secondary | ICD-10-CM | POA: Insufficient documentation

## 2012-11-19 DIAGNOSIS — Z8669 Personal history of other diseases of the nervous system and sense organs: Secondary | ICD-10-CM | POA: Insufficient documentation

## 2012-11-19 DIAGNOSIS — Z9889 Other specified postprocedural states: Secondary | ICD-10-CM | POA: Insufficient documentation

## 2012-11-19 LAB — CBC WITH DIFFERENTIAL/PLATELET
Basophils Relative: 0 % (ref 0–1)
Eosinophils Relative: 1 % (ref 0–5)
HCT: 43.6 % (ref 36.0–46.0)
Hemoglobin: 13.8 g/dL (ref 12.0–15.0)
Lymphocytes Relative: 36 % (ref 12–46)
MCHC: 31.7 g/dL (ref 30.0–36.0)
Monocytes Relative: 8 % (ref 3–12)
Neutro Abs: 3.8 10*3/uL (ref 1.7–7.7)

## 2012-11-19 LAB — COMPREHENSIVE METABOLIC PANEL
Alkaline Phosphatase: 84 U/L (ref 39–117)
BUN: 14 mg/dL (ref 6–23)
CO2: 23 mEq/L (ref 19–32)
Chloride: 100 mEq/L (ref 96–112)
GFR calc Af Amer: >90 mL/min (ref >90)
Glucose, Bld: 135 mg/dL — ABNORMAL HIGH (ref 70–99)
Potassium: 4.4 mEq/L (ref 3.5–5.1)
Total Bilirubin: 0.4 mg/dL (ref 0.3–1.2)

## 2012-11-19 LAB — LIPASE, BLOOD: Lipase: 19 U/L (ref 11–59)

## 2012-11-19 MED ORDER — HYDROCODONE-ACETAMINOPHEN 5-325 MG PO TABS: 1.0000 | ORAL_TABLET | ORAL | Status: DC | PRN

## 2012-11-19 MED ORDER — SODIUM CHLORIDE 0.9 % IV SOLN
1000.0000 mL | Freq: Once | INTRAVENOUS | Status: AC
  Administered 2012-11-19: 1000 mL via INTRAVENOUS

## 2012-11-19 MED ORDER — HYDROMORPHONE HCL PF 1 MG/ML IJ SOLN
1.0000 mg | Freq: Once | INTRAMUSCULAR | Status: AC
  Administered 2012-11-19: 1 mg via INTRAVENOUS
  Filled 2012-11-19: qty 1

## 2012-11-19 MED ORDER — ONDANSETRON HCL 4 MG/2ML IJ SOLN
4.0000 mg | Freq: Once | INTRAMUSCULAR | Status: AC
  Administered 2012-11-19: 4 mg via INTRAVENOUS
  Filled 2012-11-19: qty 2

## 2012-11-19 MED ORDER — SODIUM CHLORIDE 0.9 % IV SOLN
1000.0000 mL | INTRAVENOUS | Status: DC
  Administered 2012-11-19: 1000 mL via INTRAVENOUS

## 2012-11-19 NOTE — ED Provider Notes (Signed)
History     CSN: [id number]  Arrival date & time 11/19/12  1153   First MD Initiated Contact with Patient 11/19/12 11-21-00      Chief Complaint  Patient presents with  . Abdominal Pain    RLQ  . Nausea  . Emesis  . Constipation     The history is provided by the patient.   patient reports upper abdominal pain with associated nausea and vomiting over the past 4-5 days.  This is our third visit to the emergency room for similar symptoms.  She was discharged home 2 days ago after CT scan demonstrated gallstones without signs of cholecystitis.  She was discharged home with Phenergan but she states she cannot afford to $4 to have this filled.  She denies fevers and chills.  No hematemesis.  No diarrhea.  No melena or hematochezia.  Her pain is worsened by eating food and palpation of her upper abdomen.  She has no radiation of her pain.  Nothing improves her pain.  Her pain is constant.  She has a history of diabetes, seizure disorder, bipolar disorder, PTSD, cirrhosis, seizure  Past Medical History  Diagnosis Date  . Diabetes mellitus     diagnosed in 1996-always been on insulin  . Kidney stone   . Seizure disorder     started with pregnancy of first son  . Gastritis   . Bipolar 2 disorder   . Post traumatic stress disorder     "flipping out" after people close to her died  . DKA (diabetic ketoacidoses)     Recurrent admissions for DKA, medication non-complaince, poor social situation  . Cirrhosis   . Angina   . Heart murmur   . Shortness of breath     occassionally  . Seizures     pt states 51yrs ago  . Headache(784.0)     occassionally  . Arthritis     r ankle-S/P surgery November 21, 2005)  . Anxiety   . Peripheral vascular disease     numbness bilaterally feet  . Anemia     childhood  . H/O hiatal hernia     2 yrs ago  . Depression     childhood  . Hep C w/o coma, chronic     biopsy in 2010-no rx-was supposed to see a hepatologist in [place]  . Bipolar disorder   . PTSD  (post-traumatic stress disorder)   . Neuropathy     in legs and feet and hands    Past Surgical History  Procedure Laterality Date  . Ankle surgery    . Cesarean section    . Endometrial biopsy  06/22/2012    Family History  Problem Relation Age of Onset  . Diabetes Mother     currently 48  . Fibromyalgia Mother   . Cirrhosis Father     died in 11-21-97    History  Substance Use Topics  . Smoking status: Current Every Day Smoker -- 0.10 packs/day for 25 years    Types: Cigarettes  . Smokeless tobacco: Never Used  . Alcohol Use: No     Comment: Endorses hasn't been drinking    OB History   Grav Para Term Preterm Abortions TAB SAB Ect Mult Living   4 3 3  0 1 0 1 0 1 2      Review of Systems  Gastrointestinal: Positive for vomiting, abdominal pain and constipation.  All other systems reviewed and are negative.    Allergies  Penicillins  Home Medications  Current Outpatient Rx  Name  Route  Sig  Dispense  Refill  . aspirin EC 81 MG tablet   Oral   Take 81 mg by mouth daily.          . calcium carbonate (TUMS - DOSED IN MG ELEMENTAL CALCIUM) 500 MG chewable tablet   Oral   Chew 2 tablets by mouth daily as needed. For heartburn         . gabapentin (NEURONTIN) 300 MG capsule   Oral   Take 300 mg by mouth 2 (two) times daily.         . insulin glargine (LANTUS) 100 UNIT/ML injection   Subcutaneous   Inject 0.3 mLs (30 Units total) into the skin at bedtime.   10 mL   0   . metFORMIN (GLUCOPHAGE) 500 MG tablet   Oral   Take 500 mg by mouth at bedtime.         . ranitidine (ZANTAC) 150 MG capsule   Oral   Take 150 mg by mouth 2 (two) times daily as needed. For heartburn         . risperidone (RISPERDAL) 4 MG tablet   Oral   Take 4 mg by mouth daily.         . traZODone (DESYREL) 150 MG tablet   Oral   Take 150 mg by mouth at bedtime.         . Blood Glucose Monitoring Suppl (BLOOD GLUCOSE METER) kit      Use as instructed   1 each    0     Diabetes mellitus 250.00. Uncontrolled, Insulin de ...   . HYDROcodone-acetaminophen (NORCO/VICODIN) 5-325 MG per tablet   Oral   Take 2 tablets by mouth every 4 (four) hours as needed for pain.   6 tablet   0   . promethazine (PHENERGAN) 25 MG tablet   Oral   Take 1 tablet (25 mg total) by mouth every 6 (six) hours as needed for nausea.   30 tablet   0     BP 103/68  Pulse 69  Temp(Src) 98.8 F (37.1 C) (Oral)  Resp 20  SpO2 100%  LMP 11/15/2012  Physical Exam  Nursing note and vitals reviewed. Constitutional: She is oriented to person, place, and time. She appears well-developed and well-nourished. No distress.  HENT:  Head: Normocephalic and atraumatic.  Eyes: EOM are normal.  Neck: Normal range of motion.  Cardiovascular: Normal rate, regular rhythm and normal heart sounds.   Pulmonary/Chest: Effort normal and breath sounds normal.  Abdominal: Soft. She exhibits no distension.  Mild upper abdominal tenderness without guarding or rebound.  Musculoskeletal: Normal range of motion.  Neurological: She is alert and oriented to person, place, and time.  Skin: Skin is warm and dry.  Psychiatric: She has a normal mood and affect. Judgment normal.    ED Course  Procedures (including critical care time)  Labs Reviewed  CBC WITH DIFFERENTIAL - Abnormal; Notable for the following:    RBC 6.16 (*)    MCV 70.8 (*)    MCH 22.4 (*)    All other components within normal limits  COMPREHENSIVE METABOLIC PANEL - Abnormal; Notable for the following:    Sodium 134 (*)    Glucose, Bld 135 (*)    Creatinine, Ser 0.49 (*)    AST 71 (*)    ALT 58 (*)    All other components within normal limits  LIPASE, BLOOD  URINALYSIS, ROUTINE W  REFLEX MICROSCOPIC  PREGNANCY, URINE   US Abdomen Complete  11/19/2012   *RADIOLOGY REPORT*  Clinical Data:  42 year old female with abdominal pain nausea and vomiting.  History of hepatitis C, gallstones.  COMPLETE ABDOMINAL ULTRASOUND   Comparison:  Noncontrast CT abdomen and pelvis 11/17/2012 and earlier.  Findings:  Gallbladder:  Nonshadowing hyperechoic foci measuring up to 7 mm presumed to represent the small calcified gallstones seen on the comparison.  Some of these might also reflect tumefactive sludge. Some comet-tail artifact is noted throughout the gallbladder suggesting adenomyomatosis.  No sonographic Murphy's sign elicited. Wall thickness within normal limits up to 2 mm.  No pericholecystic fluid.  Common bile duct:  Normal measuring 3 mm diameter.  Liver:  Mildly increased echogenicity.  No discrete liver lesion or intrahepatic ductal dilatation.  IVC:  Appears normal.  Pancreas:  No focal abnormality seen.  Spleen:  Normal measuring 6.9 cm in length.  Right Kidney:  Normal measuring 1.1 cm in length.  Left Kidney:  Normal measuring 11.1 cm in length.  Abdominal aorta:  No aneurysm identified.  IMPRESSION: 1.  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  No evidence of acute cholecystitis. 2.  Mild hepatic steatosis.   Original Report Authenticated By: Erskine Speed, M.D.   I personally reviewed the imaging tests through PACS system I reviewed available ER/hospitalization records through the EMR   1. Biliary colic   2. Cholelithiasis       MDM  Patient was seen 2 days ago for similar symptoms a CT scan demonstrated gallstones.  Her symptoms today sound like biliary colic.  Ultrasound will be performed.  Labs including a lipase and LFTs.  Pain control now.  2:19 PM Patient feels much better at this time.  No signs of cholecystitis.  Discharge home with nausea medicine pain medicine.  General surgery followup.  She understands the importance of calling for followup appointment.  She will return to the ER for new or worsening symptoms      Lyanne Co, MD 11/19/12 1419

## 2012-11-19 NOTE — Progress Notes (Signed)
[facility] has seen patient and provided her with a list of primary care resources. °

## 2012-11-19 NOTE — ED Notes (Signed)
Pt reports pain and nausea x 5 days. Last meal on Saturday. Last med for nausea Tuesday. Vomited yellow liquid this am

## 2012-11-19 NOTE — ED Notes (Signed)
IV inserted by Maple Mirza, RN but was unable to obtain blood, phlebotomy aware, will monitor.

## 2012-11-19 NOTE — ED Notes (Signed)
WUJ:[id number]<JY> Expected date:11/19/12<BR> Expected time:11:48 AM<BR> Means of arrival:<BR> Comments:<BR> 39&#39;s female from [facility] abdominal pain no BM 5 days.

## 2012-11-19 NOTE — ED Notes (Signed)
Per EMS, pt was picked up at the [facility] with reports of RLQ pain, N/V and constipation that started on Sunday. EMS reports that pt's last bowel movement was on Saturday. EMS reports that pt denies fever. EMS reports unsuccessful attempt for IV insertion.

## 2012-11-19 NOTE — Progress Notes (Signed)
Pt with 1 admission and 6 ED visits in last 6 months This CM spoke with her last in April 2014 and she has been seen by [facility] liaison 3 times in last month Pt has been provided with pcp, medication, financial assistance and other [place] resources each time she has been seen by ED CM and  [facility] liaison but is non compliant with accessing these resources

## 2012-11-19 NOTE — ED Notes (Signed)
MD at bedside. 

## 2012-11-19 NOTE — ED Notes (Signed)
Pt discharged by Ophelia Shoulder, RN

## 2012-11-19 NOTE — ED Notes (Signed)
Unsuccessful attempt of IV insertion twice by RN, another RN to attempt, will monitor.

## 2013-01-07 ENCOUNTER — Emergency Department (HOSPITAL_COMMUNITY): Payer: Medicaid Other

## 2013-01-07 ENCOUNTER — Inpatient Hospital Stay (HOSPITAL_COMMUNITY)
Admission: EM | Admit: 2013-01-07 | Discharge: 2013-01-09 | DRG: 638 | Disposition: A | Discharge status: Home / Self Care | Payer: Medicaid Other | Attending: Internal Medicine | Admitting: Internal Medicine

## 2013-01-07 ENCOUNTER — Encounter (HOSPITAL_COMMUNITY): Payer: Self-pay | Admitting: Cardiology

## 2013-01-07 DIAGNOSIS — G589 Mononeuropathy, unspecified: Secondary | ICD-10-CM | POA: Diagnosis present

## 2013-01-07 DIAGNOSIS — F17200 Nicotine dependence, unspecified, uncomplicated: Secondary | ICD-10-CM | POA: Diagnosis present

## 2013-01-07 DIAGNOSIS — N183 Chronic kidney disease, stage 3 unspecified: Secondary | ICD-10-CM | POA: Diagnosis present

## 2013-01-07 DIAGNOSIS — K8020 Calculus of gallbladder without cholecystitis without obstruction: Secondary | ICD-10-CM

## 2013-01-07 DIAGNOSIS — F319 Bipolar disorder, unspecified: Secondary | ICD-10-CM | POA: Diagnosis not present

## 2013-01-07 DIAGNOSIS — R109 Unspecified abdominal pain: Secondary | ICD-10-CM | POA: Diagnosis not present

## 2013-01-07 DIAGNOSIS — G40909 Epilepsy, unspecified, not intractable, without status epilepticus: Secondary | ICD-10-CM | POA: Diagnosis present

## 2013-01-07 DIAGNOSIS — Z91148 Patient's other noncompliance with medication regimen for other reason: Secondary | ICD-10-CM

## 2013-01-07 DIAGNOSIS — B9789 Other viral agents as the cause of diseases classified elsewhere: Secondary | ICD-10-CM | POA: Diagnosis present

## 2013-01-07 DIAGNOSIS — B182 Chronic viral hepatitis C: Secondary | ICD-10-CM | POA: Diagnosis present

## 2013-01-07 DIAGNOSIS — F4310 Post-traumatic stress disorder, unspecified: Secondary | ICD-10-CM | POA: Diagnosis present

## 2013-01-07 DIAGNOSIS — E1010 Type 1 diabetes mellitus with ketoacidosis without coma: Secondary | ICD-10-CM | POA: Diagnosis not present

## 2013-01-07 DIAGNOSIS — Z91199 Patient's noncompliance with other medical treatment and regimen due to unspecified reason: Secondary | ICD-10-CM

## 2013-01-07 DIAGNOSIS — Z7982 Long term (current) use of aspirin: Secondary | ICD-10-CM

## 2013-01-07 DIAGNOSIS — I739 Peripheral vascular disease, unspecified: Secondary | ICD-10-CM | POA: Diagnosis present

## 2013-01-07 DIAGNOSIS — F3189 Other bipolar disorder: Secondary | ICD-10-CM | POA: Diagnosis present

## 2013-01-07 DIAGNOSIS — Z9114 Patient's other noncompliance with medication regimen: Secondary | ICD-10-CM

## 2013-01-07 DIAGNOSIS — E1065 Type 1 diabetes mellitus with hyperglycemia: Secondary | ICD-10-CM

## 2013-01-07 DIAGNOSIS — Z720 Tobacco use: Secondary | ICD-10-CM

## 2013-01-07 DIAGNOSIS — Z9119 Patient's noncompliance with other medical treatment and regimen: Secondary | ICD-10-CM

## 2013-01-07 DIAGNOSIS — E1110 Type 2 diabetes mellitus with ketoacidosis without coma: Secondary | ICD-10-CM

## 2013-01-07 DIAGNOSIS — Z880 Allergy status to penicillin: Secondary | ICD-10-CM

## 2013-01-07 DIAGNOSIS — J069 Acute upper respiratory infection, unspecified: Secondary | ICD-10-CM | POA: Diagnosis present

## 2013-01-07 DIAGNOSIS — IMO0002 Reserved for concepts with insufficient information to code with codable children: Secondary | ICD-10-CM

## 2013-01-07 DIAGNOSIS — Z794 Long term (current) use of insulin: Secondary | ICD-10-CM

## 2013-01-07 HISTORY — DX: Unspecified viral hepatitis C without hepatic coma: B19.20

## 2013-01-07 LAB — COMPREHENSIVE METABOLIC PANEL
ALT: 51 U/L — ABNORMAL HIGH (ref 0–35)
Alkaline Phosphatase: 103 U/L (ref 39–117)
CO2: 22 mEq/L (ref 19–32)
Chloride: 96 mEq/L (ref 96–112)
GFR calc Af Amer: >90 mL/min (ref >90)
GFR calc non Af Amer: >90 mL/min (ref >90)
Glucose, Bld: 566 mg/dL (ref 70–99)
Potassium: 4.2 mEq/L (ref 3.5–5.1)
Sodium: 136 mEq/L (ref 135–145)
Total Bilirubin: 0.3 mg/dL (ref 0.3–1.2)

## 2013-01-07 LAB — BASIC METABOLIC PANEL
Calcium: 9.7 mg/dL (ref 8.4–10.5)
Chloride: 109 mEq/L (ref 96–112)
GFR calc Af Amer: >90 mL/min (ref >90)
GFR calc Af Amer: >90 mL/min (ref >90)
GFR calc non Af Amer: >90 mL/min (ref >90)
GFR calc non Af Amer: >90 mL/min (ref >90)
Potassium: 4.3 mEq/L (ref 3.5–5.1)
Potassium: 3.6 mEq/L (ref 3.5–5.1)
Sodium: 141 mEq/L (ref 135–145)
Sodium: 145 mEq/L (ref 135–145)

## 2013-01-07 LAB — POCT I-STAT 3, VENOUS BLOOD GAS (G3P V)
O2 Saturation: 91 %
pCO2, Ven: 42.9 mmHg — ABNORMAL LOW (ref 45.0–50.0)
pH, Ven: 7.303 — ABNORMAL HIGH (ref 7.250–7.300)
pO2, Ven: 67 mmHg — ABNORMAL HIGH (ref 30.0–45.0)

## 2013-01-07 LAB — RAPID URINE DRUG SCREEN, HOSP PERFORMED
Amphetamines: NOT DETECTED
Barbiturates: NOT DETECTED

## 2013-01-07 LAB — URINALYSIS, ROUTINE W REFLEX MICROSCOPIC
Bilirubin Urine: NEGATIVE
Hgb urine dipstick: NEGATIVE
Ketones, ur: 40 mg/dL — AB
Nitrite: NEGATIVE
Urobilinogen, UA: 0.2 mg/dL (ref 0.0–1.0)

## 2013-01-07 LAB — PROTIME-INR
INR: 0.96 (ref 0.00–1.49)
Prothrombin Time: 12.6 seconds (ref 11.6–15.2)

## 2013-01-07 LAB — GLUCOSE, CAPILLARY
Glucose-Capillary: 540 mg/dL — ABNORMAL HIGH (ref 70–99)
Glucose-Capillary: 438 mg/dL — ABNORMAL HIGH (ref 70–99)
Glucose-Capillary: 576 mg/dL (ref 70–99)
Glucose-Capillary: 258 mg/dL — ABNORMAL HIGH (ref 70–99)
Glucose-Capillary: 205 mg/dL — ABNORMAL HIGH (ref 70–99)
Glucose-Capillary: 100 mg/dL — ABNORMAL HIGH (ref 70–99)

## 2013-01-07 LAB — CBC WITH DIFFERENTIAL/PLATELET
Basophils Absolute: 0 10*3/uL (ref 0.0–0.1)
Basophils Relative: 0 % (ref 0–1)
Eosinophils Absolute: 0.2 10*3/uL (ref 0.0–0.7)
MCH: 23.3 pg — ABNORMAL LOW (ref 26.0–34.0)
MCHC: 33.3 g/dL (ref 30.0–36.0)
Neutro Abs: 8.6 10*3/uL — ABNORMAL HIGH (ref 1.7–7.7)
Neutrophils Relative %: 75 % (ref 43–77)
RDW: 14.3 % (ref 11.5–15.5)

## 2013-01-07 LAB — MRSA PCR SCREENING: MRSA by PCR: NEGATIVE

## 2013-01-07 LAB — URINE MICROSCOPIC-ADD ON

## 2013-01-07 MED ORDER — INSULIN ASPART 100 UNIT/ML ~~LOC~~ SOLN: 0.0000 [IU] | Freq: Every day | SUBCUTANEOUS | Status: DC

## 2013-01-07 MED ORDER — POTASSIUM CHLORIDE CRYS ER 10 MEQ PO TBCR
20.0000 meq | EXTENDED_RELEASE_TABLET | Freq: Once | ORAL | Status: AC
  Administered 2013-01-07: 20 meq via ORAL
  Filled 2013-01-07: qty 2

## 2013-01-07 MED ORDER — MORPHINE SULFATE 2 MG/ML IJ SOLN
1.0000 mg | INTRAMUSCULAR | Status: DC | PRN
  Administered 2013-01-07: 4 mg via INTRAVENOUS
  Administered 2013-01-07: 2 mg via INTRAVENOUS
  Administered 2013-01-08: 4 mg via INTRAVENOUS
  Administered 2013-01-08 – 2013-01-09 (×5): 2 mg via INTRAVENOUS
  Administered 2013-01-09 (×2): 4 mg via INTRAVENOUS
  Filled 2013-01-07 (×5): qty 1
  Filled 2013-01-07 (×5): qty 2

## 2013-01-07 MED ORDER — DEXTROSE-NACL 5-0.45 % IV SOLN: INTRAVENOUS | Status: DC

## 2013-01-07 MED ORDER — POTASSIUM CHLORIDE CRYS ER 20 MEQ PO TBCR
20.0000 meq | EXTENDED_RELEASE_TABLET | Freq: Once | ORAL | Status: DC
  Filled 2013-01-07: qty 1

## 2013-01-07 MED ORDER — SODIUM CHLORIDE 0.9 % IV SOLN
INTRAVENOUS | Status: DC
  Administered 2013-01-07: 4.2 [IU]/h via INTRAVENOUS
  Filled 2013-01-07: qty 1

## 2013-01-07 MED ORDER — POTASSIUM CHLORIDE 10 MEQ/100ML IV SOLN
10.0000 meq | INTRAVENOUS | Status: AC
  Administered 2013-01-07: 10 meq via INTRAVENOUS
  Filled 2013-01-07: qty 200

## 2013-01-07 MED ORDER — MORPHINE SULFATE 4 MG/ML IJ SOLN
4.0000 mg | Freq: Once | INTRAMUSCULAR | Status: AC
  Administered 2013-01-07: 4 mg via INTRAVENOUS
  Filled 2013-01-07: qty 1

## 2013-01-07 MED ORDER — DEXTROSE 50 % IV SOLN: 25.0000 mL | INTRAVENOUS | Status: DC | PRN

## 2013-01-07 MED ORDER — INSULIN GLARGINE 100 UNIT/ML ~~LOC~~ SOLN
20.0000 [IU] | Freq: Every day | SUBCUTANEOUS | Status: DC
  Administered 2013-01-07: 20 [IU] via SUBCUTANEOUS
  Filled 2013-01-07 (×2): qty 0.2

## 2013-01-07 MED ORDER — SODIUM CHLORIDE 0.45 % IV SOLN
INTRAVENOUS | Status: DC
  Administered 2013-01-07 – 2013-01-08 (×3): via INTRAVENOUS

## 2013-01-07 MED ORDER — SODIUM CHLORIDE 0.9 % IV SOLN
Freq: Once | INTRAVENOUS | Status: AC
  Administered 2013-01-07: 10:00:00 via INTRAVENOUS

## 2013-01-07 MED ORDER — ONDANSETRON HCL 4 MG/2ML IJ SOLN
4.0000 mg | Freq: Once | INTRAMUSCULAR | Status: AC
  Administered 2013-01-07: 4 mg via INTRAVENOUS
  Filled 2013-01-07: qty 2

## 2013-01-07 MED ORDER — SODIUM CHLORIDE 0.9 % IV SOLN: INTRAVENOUS | Status: AC

## 2013-01-07 MED ORDER — ENOXAPARIN SODIUM 40 MG/0.4ML ~~LOC~~ SOLN
40.0000 mg | SUBCUTANEOUS | Status: DC
  Administered 2013-01-08: 40 mg via SUBCUTANEOUS
  Filled 2013-01-07 (×2): qty 0.4

## 2013-01-07 MED ORDER — SODIUM CHLORIDE 0.9 % IV SOLN
INTRAVENOUS | Status: DC
  Administered 2013-01-07: 8.9 [IU]/h via INTRAVENOUS

## 2013-01-07 MED ORDER — ONDANSETRON HCL 4 MG/2ML IJ SOLN
4.0000 mg | Freq: Four times a day (QID) | INTRAMUSCULAR | Status: DC | PRN
  Administered 2013-01-08 – 2013-01-09 (×4): 4 mg via INTRAVENOUS
  Filled 2013-01-07 (×4): qty 2

## 2013-01-07 MED ORDER — SODIUM CHLORIDE 0.9 % IV BOLUS (SEPSIS)
1000.0000 mL | Freq: Once | INTRAVENOUS | Status: AC
  Administered 2013-01-07: 1000 mL via INTRAVENOUS

## 2013-01-07 MED ORDER — INSULIN ASPART 100 UNIT/ML ~~LOC~~ SOLN
0.0000 [IU] | Freq: Three times a day (TID) | SUBCUTANEOUS | Status: DC
  Administered 2013-01-08: 3 [IU] via SUBCUTANEOUS
  Administered 2013-01-08: 5 [IU] via SUBCUTANEOUS

## 2013-01-07 MED ORDER — SODIUM CHLORIDE 0.9 % IV SOLN: INTRAVENOUS | Status: DC

## 2013-01-07 NOTE — H&P (Addendum)
Date: 01/07/2013               Patient Name:  Mary [name] MRN: [id number]  DOB: 12-Jul-1970 Age / Sex: 42 y.o., female   PCP: Dartha Lodge, FNP              Medical Service: Internal Medicine Teaching Service     I have reviewed the note by Heywood Iles MS 4 and was present during the interview and physical exam.  Please see below for findings, assessment, and plan.  Chief Complaint: abdominal pain  History of Present Illness:  The patient is a 42 YO woman who is coming into the ED with abdominal pain, with nausea. She is unable to provide much history as her mental status is waxing and waning and she is inconsolable at times from the pain. She is able to describe that the pain started this morning and she has had it before. She does have problems with her sugars and states that she takes lantus 50 units at night. She states that she thinks she is taking her lantus but is out of strips and unable to check her sugars for weeks. She denied chest pain, vomiting, diarrhea, constipation, chest pain. She is having back and leg pain which she states are not changed from usual and she is usually in pain. She is not cooperative with the history much more but does note that she has not had any fevers or chills.   Meds: Current Facility-Administered Medications  Medication Dose Route Frequency Provider Last Rate Last Dose  . 0.45 % sodium chloride infusion   Intravenous Continuous Judie Bonus, MD 125 mL/hr at 01/07/13 1820    . dextrose 50 % solution 25 mL  25 mL Intravenous PRN Judie Bonus, MD      . enoxaparin (LOVENOX) injection 40 mg  40 mg Subcutaneous Q24H Judie Bonus, MD      . insulin aspart (novoLOG) injection 0-5 Units  0-5 Units Subcutaneous QHS Judie Bonus, MD      . Melene Muller ON 01/08/2013] insulin aspart (novoLOG) injection 0-9 Units  0-9 Units Subcutaneous TID WC Judie Bonus, MD      . insulin glargine (LANTUS) injection 20 Units  20 Units Subcutaneous  QHS Judie Bonus, MD   20 Units at 01/07/13 1840  . insulin regular (NOVOLIN R,HUMULIN R) 1 Units/mL in sodium chloride 0.9 % 100 mL infusion   Intravenous Continuous Judie Bonus, MD 5.1 mL/hr at 01/07/13 1908 5.1 Units/hr at 01/07/13 1908  . morphine 2 MG/ML injection 1-4 mg  1-4 mg Intravenous Q3H PRN Judie Bonus, MD   4 mg at 01/07/13 1819  . potassium chloride (K-DUR,KLOR-CON) CR tablet 20 mEq  20 mEq Oral Once Rocco Serene, MD        Allergies: Allergies as of 01/07/2013 - Review Complete 01/07/2013  Allergen Reaction Noted  . Penicillins Rash 03/26/2011    Past Medical History: Medical Student note reviewed  Family History: Medical Student note reviewed  Social History: Psychologist, occupational note reviewed  Surgical History: Medical Student note reviewed  Review of System: Medical Student note reviewed  Physical Exam: Blood pressure 108/71, pulse 59, temperature 98 F (36.7 C), temperature source Oral, resp. rate 19, height 5\' 4"  (1.626 m), weight 116 lb 2.9 oz (52.7 kg), SpO2 98.00%. General: resting in bed, writhing, inconsolable  HEENT: PERRL, EOMI, no scleral icterus Cardiac: S1 S2 heard, no obvious murmur Pulm: unable to examine  fully due to lack of patient cooperation Abd: unable to examine per patient request Ext: unable to examine per patient request Neuro: knows self, not oriented otherwise, moving all four extremities  Labs: Reviewed as noted in the Electronic Record  Imaging: Reviewed as noted in the Electronic Record  Assessment & Plan by Problem:  DKA (diabetic ketoacidoses) - AG 18 on admission with ketones and glucose present in the urine, pH venous 7.3 which is not as telling as an arterial blood gas would be. However this seems to be the etiology for elevated AG. She was still lucid and able to talk so no need for emergent intubation. She had insulin drip started in the ED with D5 1/2 NS which was changed on admission. We did  follow DKA protocol and K 4.3 on admission ordered 2 runs of KCl and recheck BMP q 2 hours. She will be kept on the insulin drip until the gap is closed and transitioned to lantus with 2 hour overlap. Her fluids will be without D5 until her sugar is less than 250 and then will add D5 to fluids. Patient does not admit to any new symptoms upon more arousal and no infectious cause identified. She states that she has been taking her medication however has history of non-compliance. Will observe abdominal pain and if no improvement will evaluate further.  -Insulin drip, fluids -KCl 2 runs -BMP every 2 hours until gap closed and off insulin drip -Observe closely for signs of infection  Type I DM, likely uncontrolled - Last HgA1c unknown but multiple episodes of elevated sugars in the medical record. She is supposed to be taking lantus 25 units nightly and metformin at home which speaks to perhaps DM 1.5 or poor compliance with insulin.  -DKA protocol as above -Transition to SSI and lantus as able  Non compliance w medication regimen - Patient was not very lucid and states that she was taking her lantus however unclear and will continue to evaluate and educate about proper regimen.   Bipolar disorder - Per record review. Last MRN mentions that she is on haldol and unclear if she is bipolar I or II as both are documented. Will observe closely and treat appropriately. Hold haldol at this time.   DVT ppx - lovenox [place] daily  Dispo: Disposition is deferred at this time, awaiting improvement of current medical problems. Anticipated discharge in approximately 2-3 day(s).   The patient does have a current PCP Dartha Lodge, FNP) and does not need an [facility] hospital follow-up appointment after discharge.  The patient does not have transportation limitations that hinder transportation to clinic appointments.  Signed: Judie Bonus, MD 01/07/2013, 8:44 PM

## 2013-01-07 NOTE — ED Notes (Signed)
Pt to department via EMS- reports she woke up this morning with epigastric pain and n/v. Pt with one episode of vomiting PTA. Given 4mg  Zofran IM. HR-54 Bp-118/94.

## 2013-01-07 NOTE — ED Notes (Signed)
Talked with Internal Medicine- informed that pt could be transported upstairs and they would see the pt there and write orders.

## 2013-01-07 NOTE — ED Provider Notes (Signed)
History    CSN: [id number] Arrival date & time 01/07/13  7829  First MD Initiated Contact with Patient 01/07/13 [id number]     Chief Complaint  Patient presents with  . Abdominal Pain  . Nausea  . Emesis   (Consider location/radiation/quality/duration/timing/severity/associated sxs/prior Treatment) HPI Comments: 42 yo AAF presents with abdominal pain, nausea, and vomiting that started this morning. She has a PMH significant for cholelithiasis, DM type 1 with history of DKA, and cirrhosis of the liver. She states she has vomited several times this morning and her vomit appears yellow. She denies hematemesis. She is unable to isolate the location where she is experiencing the most pain. She states the pain is similar to when she was diagnosed with having gallstones and is radiating to the back. She notes a decrease in appetite. She denies alcohol and recreational drug use. On ROS she denies fever, chills, sweats, diarrhea, and vaginal discharge.  Patient is a 42 y.o. female presenting with abdominal pain and vomiting. The history is provided by the patient and medical records.  Abdominal Pain Associated symptoms include abdominal pain. Pertinent negatives include no chest pain and no shortness of breath.  Emesis Associated symptoms: abdominal pain   Associated symptoms: no diarrhea    Past Medical History  Diagnosis Date  . Diabetes mellitus     diagnosed in 1996-always been on insulin  . Kidney stone   . Seizure disorder     started with pregnancy of first son  . Gastritis   . Bipolar 2 disorder   . Post traumatic stress disorder     "flipping out" after people close to her died  . DKA (diabetic ketoacidoses)     Recurrent admissions for DKA, medication non-complaince, poor social situation  . Cirrhosis   . Angina   . Heart murmur   . Shortness of breath     occassionally  . Seizures     pt states 55yrs ago  . Headache(784.0)     occassionally  . Arthritis     r ankle-S/P  surgery 2005-11-05)  . Anxiety   . Peripheral vascular disease     numbness bilaterally feet  . Anemia     childhood  . H/O hiatal hernia     2 yrs ago  . Depression     childhood  . Hep C w/o coma, chronic     biopsy in 2010-no rx-was supposed to see a hepatologist in [place]  . Bipolar disorder   . PTSD (post-traumatic stress disorder)   . Neuropathy     in legs and feet and hands   Past Surgical History  Procedure Laterality Date  . Ankle surgery    . Cesarean section    . Endometrial biopsy  06/22/2012   Family History  Problem Relation Age of Onset  . Diabetes Mother     currently 89  . Fibromyalgia Mother   . Cirrhosis Father     died in 11-05-97   History  Substance Use Topics  . Smoking status: Current Every Day Smoker -- 0.10 packs/day for 25 years    Types: Cigarettes  . Smokeless tobacco: Never Used  . Alcohol Use: No     Comment: Endorses hasn't been drinking   OB History   Grav Para Term Preterm Abortions TAB SAB Ect Mult Living   4 3 3  0 1 0 1 0 1 2     Review of Systems  Constitutional: Positive for activity change. Negative for fever.  HENT: Negative for facial swelling and neck pain.   Respiratory: Negative for cough, shortness of breath and wheezing.   Cardiovascular: Negative for chest pain.  Gastrointestinal: Positive for nausea, vomiting and abdominal pain. Negative for diarrhea, constipation, blood in stool and abdominal distention.  Genitourinary: Negative for hematuria and difficulty urinating.  Skin: Negative for color change.  Neurological: Positive for weakness. Negative for speech difficulty.  Hematological: Does not bruise/bleed easily.  Psychiatric/Behavioral: Negative for confusion.    Allergies  Penicillins  Home Medications   Current Outpatient Rx  Name  Route  Sig  Dispense  Refill  . aspirin EC 81 MG tablet   Oral   Take 81 mg by mouth daily.          . Blood Glucose Monitoring Suppl (BLOOD GLUCOSE METER) kit       Use as instructed   1 each   0     Diabetes mellitus 250.00. Uncontrolled, Insulin de ...   . calcium carbonate (TUMS - DOSED IN MG ELEMENTAL CALCIUM) 500 MG chewable tablet   Oral   Chew 2 tablets by mouth daily as needed. For heartburn         . gabapentin (NEURONTIN) 300 MG capsule   Oral   Take 300 mg by mouth 2 (two) times daily.         Marland Kitchen HYDROcodone-acetaminophen (NORCO/VICODIN) 5-325 MG per tablet   Oral   Take 1 tablet by mouth every 4 (four) hours as needed for pain.   15 tablet   0   . insulin glargine (LANTUS) 100 UNIT/ML injection   Subcutaneous   Inject 0.3 mLs (30 Units total) into the skin at bedtime.   10 mL   0   . metFORMIN (GLUCOPHAGE) 500 MG tablet   Oral   Take 500 mg by mouth at bedtime.         . promethazine (PHENERGAN) 25 MG tablet   Oral   Take 1 tablet (25 mg total) by mouth every 6 (six) hours as needed for nausea.   30 tablet   0   . ranitidine (ZANTAC) 150 MG capsule   Oral   Take 150 mg by mouth 2 (two) times daily as needed. For heartburn         . risperidone (RISPERDAL) 4 MG tablet   Oral   Take 4 mg by mouth daily.         . traZODone (DESYREL) 150 MG tablet   Oral   Take 150 mg by mouth at bedtime.          BP 138/84  Pulse 54  Temp(Src) 98 F (36.7 C) (Oral)  Resp 12  SpO2 100% Physical Exam  Nursing note and vitals reviewed. Constitutional: She is oriented to person, place, and time. She appears well-developed and well-nourished.  HENT:  Head: Normocephalic and atraumatic.  Eyes: EOM are normal. Pupils are equal, round, and reactive to light.  Neck: Neck supple.  Cardiovascular: Normal rate, regular rhythm and normal heart sounds.   No murmur heard. Pulmonary/Chest: Effort normal. No respiratory distress.  Abdominal: Soft. She exhibits no distension. There is tenderness. There is no rebound and no guarding.  RUQ tenderness, negative murphy's   Neurological: She is alert and oriented to person, place,  and time.  Skin: Skin is warm and dry.    ED Course  Procedures (including critical care time) Labs Reviewed  URINALYSIS, ROUTINE W REFLEX MICROSCOPIC - Abnormal; Notable for the following:  Specific Gravity, Urine 1.034 (*)    Glucose, UA >1000 (*)    Ketones, ur 40 (*)    All other components within normal limits  GLUCOSE, CAPILLARY - Abnormal; Notable for the following:    Glucose-Capillary 540 (*)    All other components within normal limits  URINE MICROSCOPIC-ADD ON  COMPREHENSIVE METABOLIC PANEL  CBC WITH DIFFERENTIAL  LIPASE, BLOOD  PROTIME-INR  URINE RAPID DRUG SCREEN (HOSP PERFORMED)  POCT PREGNANCY, URINE   No results found. No diagnosis found.  MDM  Pt with hx of cholelithiasis and DM comes in with cc of abd pain. Her exam shows diffuse upper quadrant tenderness, but the RUQ is the worst.  DDx includes: Pancreatitis Hepatobiliary pathology including cholecystitis Gastritis/PUD SBO ACS syndrome DKA  Will screen for DKA. Will have to repeat US. If patient has persistent pain despite negative [place], we will call surgery.    Derwood Kaplan, MD 01/07/13 [phone]

## 2013-01-07 NOTE — ED Notes (Signed)
CBG 540. 

## 2013-01-07 NOTE — H&P (Signed)
Date: 01/07/2013               Patient Name:  Mary [name] MRN: [id number]  DOB: 07/01/1970 Age / Sex: 42 y.o., female   PCP: Dartha Lodge, FNP              Medical Service: Internal Medicine Teaching Service              Attending Physician: Dr. Rocco Serene, MD    First Contact: Heywood Iles, MS4 Pager: [phone]  Second Contact: Dr. Janalyn Harder Pager: [phone]            After Hours (After 5p/  First Contact Pager: [phone]  weekends / holidays): Second Contact Pager: [phone]   Chief Complaint: abdominal pain  History of Present Illness: Patient is a 42 year old female with DM1, seizure disorder, bipolar disorder, and CKD Stage 3 who presents with abdominal pain. Patient was in great pain and history/physical exam were limited.  Last night, patient took her Lantus but woke up this morning feeling sick. She notes excruciating stomach pain and vomiting. Fever but not sure of temperature. Complains of pain all over her body though notes back/chest pain at baseline. She has not taken any additional medications. She says she checks her glucose though is out of strips. She has not taken any other medications to treat herself.  Per the ED, glucose was 576 and anion gap was 20. Started on D5 1/2NS and insulin drip.   Meds: Current Facility-Administered Medications  Medication Dose Route Frequency Provider Last Rate Last Dose  . 0.45 % sodium chloride infusion   Intravenous Continuous Judie Bonus, MD 125 mL/hr at 01/07/13 1820    . 0.9 %  sodium chloride infusion   Intravenous Continuous Judie Bonus, MD 150 mL/hr at 01/07/13 1438    . dextrose 5 %-0.45 % sodium chloride infusion   Intravenous Continuous Judie Bonus, MD      . dextrose 50 % solution 25 mL  25 mL Intravenous PRN Judie Bonus, MD      . enoxaparin (LOVENOX) injection 40 mg  40 mg Subcutaneous Q24H Judie Bonus, MD      . insulin glargine (LANTUS) injection 20 Units  20 Units  Subcutaneous QHS Judie Bonus, MD   20 Units at 01/07/13 1840  . insulin regular (NOVOLIN R,HUMULIN R) 1 Units/mL in sodium chloride 0.9 % 100 mL infusion   Intravenous Continuous Judie Bonus, MD 5.1 mL/hr at 01/07/13 1908 5.1 Units/hr at 01/07/13 1908  . morphine 2 MG/ML injection 1-4 mg  1-4 mg Intravenous Q3H PRN Judie Bonus, MD   4 mg at 01/07/13 1819  . potassium chloride SA (K-DUR,KLOR-CON) CR tablet 20 mEq  20 mEq Oral Once Judie Bonus, MD        Allergies: Allergies as of 01/07/2013 - Review Complete 01/07/2013  Allergen Reaction Noted  . Penicillins Rash 03/26/2011   Past Medical History  Diagnosis Date  . Diabetes mellitus     diagnosed in 1996-always been on insulin  . Kidney stone   . Seizure disorder     started with pregnancy of first son  . Gastritis   . Bipolar 2 disorder   . Post traumatic stress disorder     "flipping out" after people close to her died  . DKA (diabetic ketoacidoses)     Recurrent admissions for DKA, medication non-complaince, poor social situation  . Cirrhosis   . Angina   .  Heart murmur   . Shortness of breath     occassionally  . Seizures     pt states 46yrs ago  . Headache(784.0)     occassionally  . Arthritis     r ankle-S/P surgery (2007)  . Anxiety   . Peripheral vascular disease     numbness bilaterally feet  . Anemia     childhood  . H/O hiatal hernia     2 yrs ago  . Depression     childhood  . Hep C w/o coma, chronic     biopsy in 2010-no rx-was supposed to see a hepatologist in [place]  . Bipolar disorder   . PTSD (post-traumatic stress disorder)   . Neuropathy     in legs and feet and hands   Past Surgical History  Procedure Laterality Date  . Ankle surgery    . Cesarean section    . Endometrial biopsy  06/22/2012   Family History  Problem Relation Age of Onset  . Diabetes Mother     currently 4  . Fibromyalgia Mother   . Cirrhosis Father     died in 8   History    Social History  . Marital Status: Single    Spouse Name: N/A    Number of Children: N/A  . Years of Education: N/A   Occupational History  . Not on file.   Social History Main Topics  . Smoking status: Current Every Day Smoker -- 0.10 packs/day for 25 years    Types: Cigarettes  . Smokeless tobacco: Never Used  . Alcohol Use: No     Comment: Endorses hasn't been drinking  . Drug Use: 1.00 per week    Special: Marijuana     Comment: used crack last year.  every now and then uses puffs some marijuana-only on the weekends<?>  . Sexually Active: Yes    Birth Control/ Protection: None   Other Topics Concern  . 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 Came back to [place] from [place]-was in rehab for 3 mo. Then [facility] to [facility] center ([facility] Currently 05/2011 endorses not doing any drugs except very occasional marijuana when she's nauseous.    Lives in [place] with BF, has grown children, 2 sons. Ambulatory.                Review of Systems: As noted in the HPI  Physical Exam: Blood pressure 108/71, pulse 59, temperature 98 F (36.7 C), temperature source Oral, resp. rate 19, height 5\' 4"  (1.626 m), weight 52.7 kg (116 lb 2.9 oz), SpO2 98.00%. General: alert, uncooperative, and in significant pain Heart: regular rate and rhythm, no murmurs, gallops, or rubs Neurologic: alert & oriented X3, cranial nerves II-XII intact, strength grossly intact, sensation intact to light touch  Lab results: BMET    Component Value Date/Time   NA 145 01/07/2013 1646   K 3.6 01/07/2013 1646   CL 109 01/07/2013 1646   CO2 22 01/07/2013 1646   GLUCOSE 274* 01/07/2013 1646   BUN 21 01/07/2013 1646   CREATININE 0.51 01/07/2013 1646   CALCIUM 9.8 01/07/2013 1646   GFRNONAA >90 01/07/2013 1646   GFRAA >90 01/07/2013 1646   CBC    Component Value Date/Time   WBC 11.6* 01/07/2013 0830   RBC 5.76* 01/07/2013  0830   HGB 13.4 01/07/2013 0830   HCT 40.3 01/07/2013 0830   PLT  153 01/07/2013 0830   MCV 70.0* 01/07/2013 0830   MCH 23.3* 01/07/2013 0830   MCHC 33.3 01/07/2013 0830   RDW 14.3 01/07/2013 0830   LYMPHSABS 2.3 01/07/2013 0830   MONOABS 0.5 01/07/2013 0830   EOSABS 0.2 01/07/2013 0830   BASOSABS 0.0 01/07/2013 0830   Urinalysis    Component Value Date/Time   COLORURINE YELLOW 01/07/2013 0853   APPEARANCEUR CLEAR 01/07/2013 0853   LABSPEC 1.034* 01/07/2013 0853   PHURINE 6.5 01/07/2013 0853   GLUCOSEU >1000* 01/07/2013 0853   HGBUR NEGATIVE 01/07/2013 0853   BILIRUBINUR NEGATIVE 01/07/2013 0853   KETONESUR 40* 01/07/2013 0853   PROTEINUR NEGATIVE 01/07/2013 0853   UROBILINOGEN 0.2 01/07/2013 0853   NITRITE NEGATIVE 01/07/2013 0853   LEUKOCYTESUR NEGATIVE 01/07/2013 0853   ABG    Component Value Date/Time   PHART 7.328* 10/20/2012 1238   PCO2ART 33.2* 10/20/2012 1238   PO2ART 100.0 10/20/2012 1238   HCO3 21.3 01/07/2013 1221   TCO2 23 01/07/2013 1221   ACIDBASEDEF 5.0* 01/07/2013 1221   O2SAT 91.0 01/07/2013 1221    Imaging results:  US Abdomen Complete  01/07/2013   *RADIOLOGY REPORT*  Clinical Data:  Abdominal pain.  History of diabetes and hepatitis C.  COMPLETE ABDOMINAL ULTRASOUND  Comparison:  Abdominal ultrasound 11/19/2012.  Findings:  Gallbladder: Small gallstones are again noted.  There is debris and/or cholesterol polyps in the gallbladder as before.  There is no gallbladder wall thickening or pericholecystic fluid. Sonographic Murphy's sign is absent.  Common bile duct:   Normal in caliber without filling defects.  Liver:  Echogenicity is within normal limits.  No focal hepatic abnormalities are identified.  IVC:  Visualized portions appear unremarkable.  Pancreas:  Visualized portions appear unremarkable.The pancreatic duct does not appear significantly dilated.  Spleen:  Visualized portions appear unremarkable.  Right Kidney:   The renal cortical thickness and echogenicity are preserved.   There is no hydronephrosis or focal abnormality. Renal length is 11.0 cm.  Left Kidney:   The renal cortical thickness and echogenicity are preserved.  There is no hydronephrosis or focal abnormality. Renal length is 11.2 cm.  Abdominal aorta:  Visualized portions appear unremarkable.  IMPRESSION:  1.  Stable cholelithiasis and suspected cholesterol polyps.  No evidence of cholecystitis. 2.  No acute findings demonstrated.   Original Report Authenticated By: Carey Bullocks, M.D.   Dg Chest Port 1 View  01/07/2013   *RADIOLOGY REPORT*  Clinical Data: Chest pain  PORTABLE CHEST - 1 VIEW  Comparison: None.  Findings: The heart and pulmonary vascularity are within normal limits.  The lungs are clear.  The upper abdomen is unremarkable. No bony abnormality is seen.  IMPRESSION: No acute abnormality noted.   Original Report Authenticated By: Alcide Clever, M.D.    Assessment & Plan by Problem: Patient is a 42 year old female with DM1, seizure disorder, bipolar disorder, and CKD Stage 3 who presents with DKA 2/2 possible medical non-adherence.   #DKA: Likely given the context of her medical non-adherence as well as past hospitalizations with similar symptoms. -Stopped D5 1/2NS and started D5 NS -Continue insulin -Replete K as needed; 4.2 on admission -Diet clear liquids and advance once symptoms resolve -Morphine 2 mg q3h prn for pain   -Will attempt to assess treatment adherence when patient is in better condition  #CKD Stage 3: eGFR>90 on admission. Will need to revisit when diagnosed per record.  #DVT prophylaxis: Lovenox  #Disposition: Pending resolution of symptoms but likely 1-2 days.  This  is a Psychologist, occupational Note.  The care of the patient was discussed with Dr. Suszanne Conners and the assessment and plan was formulated with their assistance.  Please see their note for official documentation of the patient encounter.   Signed: Beather Arbour, Med Student 01/07/2013, 7:32 PM

## 2013-01-07 NOTE — ED Notes (Signed)
CBG 478 

## 2013-01-08 ENCOUNTER — Encounter (HOSPITAL_COMMUNITY): Payer: Self-pay | Admitting: Internal Medicine

## 2013-01-08 LAB — GLUCOSE, CAPILLARY
Glucose-Capillary: 97 mg/dL (ref 70–99)
Glucose-Capillary: 246 mg/dL — ABNORMAL HIGH (ref 70–99)
Glucose-Capillary: 283 mg/dL — ABNORMAL HIGH (ref 70–99)
Glucose-Capillary: 261 mg/dL — ABNORMAL HIGH (ref 70–99)
Glucose-Capillary: 231 mg/dL — ABNORMAL HIGH (ref 70–99)
Glucose-Capillary: 261 mg/dL — ABNORMAL HIGH (ref 70–99)

## 2013-01-08 LAB — BASIC METABOLIC PANEL
CO2: 20 mEq/L (ref 19–32)
CO2: 18 mEq/L — ABNORMAL LOW (ref 19–32)
Calcium: 9.1 mg/dL (ref 8.4–10.5)
Chloride: 103 mEq/L (ref 96–112)
Chloride: 102 mEq/L (ref 96–112)
GFR calc Af Amer: >90 mL/min (ref >90)
GFR calc Af Amer: >90 mL/min (ref >90)
GFR calc non Af Amer: >90 mL/min (ref >90)
GFR calc non Af Amer: >90 mL/min (ref >90)
Potassium: 4.4 mEq/L (ref 3.5–5.1)
Potassium: 3.7 mEq/L (ref 3.5–5.1)
Sodium: 139 mEq/L (ref 135–145)

## 2013-01-08 LAB — AMYLASE: Amylase: 46 U/L (ref 0–105)

## 2013-01-08 LAB — CBC
Hemoglobin: 12.2 g/dL (ref 12.0–15.0)
MCHC: 32.5 g/dL (ref 30.0–36.0)
RBC: 5.25 MIL/uL — ABNORMAL HIGH (ref 3.87–5.11)
WBC: 20 10*3/uL — ABNORMAL HIGH (ref 4.0–10.5)

## 2013-01-08 LAB — LIPASE, BLOOD: Lipase: 13 U/L (ref 11–59)

## 2013-01-08 MED ORDER — INSULIN ASPART 100 UNIT/ML ~~LOC~~ SOLN
0.0000 [IU] | Freq: Three times a day (TID) | SUBCUTANEOUS | Status: DC
  Administered 2013-01-08: 8 [IU] via SUBCUTANEOUS

## 2013-01-08 MED ORDER — DEXTROSE-NACL 5-0.45 % IV SOLN
INTRAVENOUS | Status: AC
  Administered 2013-01-09 (×2): via INTRAVENOUS

## 2013-01-08 MED ORDER — SODIUM CHLORIDE 0.9 % IV SOLN
INTRAVENOUS | Status: DC
  Administered 2013-01-08 – 2013-01-09 (×2): via INTRAVENOUS

## 2013-01-08 MED ORDER — SODIUM CHLORIDE 0.9 % IV SOLN
INTRAVENOUS | Status: DC
  Administered 2013-01-08: 16:00:00 via INTRAVENOUS

## 2013-01-08 MED ORDER — ENOXAPARIN SODIUM 40 MG/0.4ML ~~LOC~~ SOLN
40.0000 mg | SUBCUTANEOUS | Status: DC
  Administered 2013-01-08: 40 mg via SUBCUTANEOUS
  Filled 2013-01-08 (×2): qty 0.4

## 2013-01-08 MED ORDER — SODIUM CHLORIDE 0.9 % IV SOLN
INTRAVENOUS | Status: AC
  Administered 2013-01-08: 2 [IU]/h via INTRAVENOUS
  Filled 2013-01-08: qty 1

## 2013-01-08 MED ORDER — INSULIN GLARGINE 100 UNIT/ML ~~LOC~~ SOLN
30.0000 [IU] | Freq: Every day | SUBCUTANEOUS | Status: DC
  Filled 2013-01-08: qty 0.3

## 2013-01-08 MED ORDER — INSULIN GLARGINE 100 UNIT/ML ~~LOC~~ SOLN
40.0000 [IU] | Freq: Every day | SUBCUTANEOUS | Status: DC
  Filled 2013-01-08: qty 0.4

## 2013-01-08 MED ORDER — POTASSIUM CHLORIDE CRYS ER 20 MEQ PO TBCR
40.0000 meq | EXTENDED_RELEASE_TABLET | Freq: Once | ORAL | Status: AC
  Administered 2013-01-08: 40 meq via ORAL
  Filled 2013-01-08 (×2): qty 1

## 2013-01-08 NOTE — Care Management Note (Unsigned)
    Page 1 of 1   01/08/2013     10:55:05 AM   CARE MANAGEMENT NOTE 01/08/2013  Patient:  CATLYNN, GRONDAHL   Account Number:  [id number]  Date Initiated:  01/08/2013  Documentation initiated by:  [facility]  Subjective/Objective Assessment:   Adm w/DKA; N/V; abd pain.     Action/Plan:   Pt from home; CM verified PCP Dartha Lodge, MD and is seen and gets meds thru [facility] & [facility]; anticipate return home and will follow for DC needs.   Anticipated DC Date:  01/11/2013   Anticipated DC Plan:  HOME/SELF CARE      DC Planning Services  CM consult      Choice offered to / List presented to:             Status of service:  In process, will continue to follow Medicare Important Message given?   (If response is "NO", the following Medicare IM given date fields will be blank) Date Medicare IM given:   Date Additional Medicare IM given:    Discharge Disposition:    Per UR Regulation:  Reviewed for med. necessity/level of care/duration of stay  If discussed at Long Length of Stay Meetings, dates discussed:    Comments:

## 2013-01-08 NOTE — Progress Notes (Signed)
I have seen the patient and reviewed the daily progress note by Heywood Iles MS4 and discussed the care of the patient with them.  See below for documentation of my findings, assessment, and plans.  Subjective: The patient was admitted yesterday with DKA.  She was managed with an insulin drip, and was transitioned to Lantus yesterday evening.  The patient notes persistent, though mildly improved abdominal pain this morning.  The patient's anion gap has narrowed, but remains at 16 this morning.  Objective: Vital signs in last 24 hours: Filed Vitals:   01/08/13 0400 01/08/13 0800 01/08/13 1200 01/08/13 1223  BP: 145/88 126/88  114/72  Pulse: 73 72  75  Temp: 98.3 F (36.8 C) 98.5 F (36.9 C) 98.8 F (37.1 C)   TempSrc: Oral Oral Oral   Resp: 14 11  16   Height:      Weight:      SpO2: 99% 98%  99%   Weight change:   Intake/Output Summary (Last 24 hours) at 01/08/13 1403 Last data filed at 01/08/13 0800  Gross per 24 hour  Intake 873.11 ml  Output   1550 ml  Net -676.89 ml  General: Lying in bed, appears uncomfortable HEENT: pupils equal round and reactive to light, vision grossly intact, oropharynx clear and non-erythematous  Neck: supple, no lymphadenopathy Lungs: clear to ascultation bilaterally, normal work of respiration, no wheezes, rales, ronchi Heart: regular rate and rhythm, no murmurs, gallops, or rubs Abdomen: soft, moderately tender to palpation, non-distended, +bs Extremities: no cyanosis, clubbing, or edema Neurologic: alert & oriented X3, cranial nerves II-XII intact, strength grossly intact, sensation intact to light touch  Lab Results: Reviewed and documented in Electronic Record Micro Results: Reviewed and documented in Electronic Record Studies/Results: Reviewed and documented in Electronic Record Medications: I have reviewed the patient's current medications. Scheduled Meds: . enoxaparin (LOVENOX) injection  40 mg Subcutaneous Q24H  . insulin aspart   0-15 Units Subcutaneous TID WC  . insulin aspart  0-5 Units Subcutaneous QHS  . insulin glargine  30 Units Subcutaneous QHS   Continuous Infusions: . sodium chloride 125 mL/hr at 01/08/13 1400   PRN Meds:.dextrose, morphine injection, ondansetron (ZOFRAN) IV Assessment/Plan: The patient is a 42 yo woman, history of DM, bipolar disorder, seizure disorder, HCV, presenting with DKA.  # DKA - the patient presented with an AG which peaked at 23, and a bicarb as low as 16.  The patient's labs have improved this morning to an AG of 16, and a bicarb of 20.  The patient was maintained on an insulin drip yesterday, which was transitioned to Lantus yesterday evening.  The patient's CBG's remain in the 200's this morning.  The etiology of the DKA was likely a viral URI, along with inconsistent compliance with insulin.  The patient's WBC count increased this morning, though this is likely reactive from DKA.  CXR neg, abd [place] neg. -increase Lantus from 20 to 40 units qhs (pt takes 50 units qhs at home per patient report) -increase SSI to moderate -recheck BMET this PM.  If AG not improving, can consider restarting insulin drip -change 1/2NS to NS  # Diabetes Mellitus - unclear history.  Per chart review, patient was diagnosed at age 38.  There is mention that this may be Type I in her chart, but the patient is managed at home on metformin and Lantus, which is a more classic regimen for Type 2. -treat DKA per above for now, transition to home regimen as DKA  resolves  # Abdominal pain - likely due to DKA above.  Improving.  Abd [place] negative.  # HCV - history of HCV, with a positive viral load in 2010.  LFT's at baseline this admission.  Dispo: Transfer out of stepdown today.  Likely d/c home tomorrow pending resolution of DKA.  The patient does have a current PCP Dartha Lodge, FNP) and does not need an [facility] hospital follow-up appointment after discharge.  .Services Needed at time of discharge: Y = Yes,  Blank = No PT:   OT:   RN:   Equipment:   Other:     LOS: 1 day   Linward Headland, MD 01/08/2013, 2:03 PM

## 2013-01-08 NOTE — Progress Notes (Signed)
Lab drawn at 1820, will get result prior to starting insulin gtt and glucomander

## 2013-01-08 NOTE — H&P (Signed)
I saw and evaluated the patient. I reviewed the resident's note and confirmed the resident's findings.  I agree with the assessment and plan as documented in the resident's note.  Briefly, Mary [name] is a 42 yo woman with a 2-3 day history of a "cold" marked by cough and generalized aches who subsequently stopped taking her insulin, likely developed diabetic ketoacidosis with resultant symptomatic abdominal pain from the metabolic acidosis.  Examination this morning was notable for an agitated young woman with slightly pressured speech and soft but mildly tender abdomen.  She is being treated with an insulin drip and IVFs.  We will continue this therapy overnight in hopes of closing the gap.  I suspect with resolution of the metabolic acidosis her abdominal pain will improve.

## 2013-01-08 NOTE — Progress Notes (Signed)
Subjective: Experiencing significantly less pain this AM. Vomited after eating breakfast this morning. Complains of being irritable when sick and believes everything started with getting sick. May have missed some of her insulin doses. Needs insulin syringes as well as strips for glucose machine. Could not physically examine patient due to her demeanor.  Anion gap 16 this AM, down from 24 yesterday. Clear liquid diet.   Objective: Vital signs in last 24 hours: Filed Vitals:   01/08/13 0000 01/08/13 0400 01/08/13 0800 01/08/13 1200  BP: 115/78 145/88    Pulse: 65 73    Temp: 98.2 F (36.8 C) 98.3 F (36.8 C) 98.5 F (36.9 C) 98.8 F (37.1 C)  TempSrc: Oral Oral Oral Oral  Resp: 13 14    Height:      Weight:      SpO2: 100% 99%     Lab Results: BMET    Component Value Date/Time   NA 139 01/08/2013 0722   K 4.1 01/08/2013 0722   CL 103 01/08/2013 0722   CO2 20 01/08/2013 0722   GLUCOSE 258* 01/08/2013 0722   BUN 20 01/08/2013 0722   CREATININE 0.56 01/08/2013 0722   CALCIUM 9.1 01/08/2013 0722   GFRNONAA >90 01/08/2013 0722   GFRAA >90 01/08/2013 0722   Amylase    Component Value Date/Time   AMYLASE 46 01/08/2013 0722   Lipase     Component Value Date/Time   LIPASE 13 01/08/2013 0722   CBC    Component Value Date/Time   WBC 20.0* 01/08/2013 0722   RBC 5.25* 01/08/2013 0722   HGB 12.2 01/08/2013 0722   HCT 37.5 01/08/2013 0722   PLT 157 01/08/2013 0722   MCV 71.4* 01/08/2013 0722   MCH 23.2* 01/08/2013 0722   MCHC 32.5 01/08/2013 0722   RDW 15.1 01/08/2013 0722   LYMPHSABS 2.3 01/07/2013 0830   MONOABS 0.5 01/07/2013 0830   EOSABS 0.2 01/07/2013 0830   BASOSABS 0.0 01/07/2013 0830   Micro Results: Recent Results (from the past 240 hour(s))  MRSA PCR SCREENING     Status: None   Collection Time    01/07/13  1:34 PM      Result Value Range Status   MRSA by PCR NEGATIVE  NEGATIVE Final   Comment:            The GeneXpert MRSA Assay (FDA     approved for NASAL specimens   only), is one component of a     comprehensive MRSA colonization     surveillance program. It is not     intended to diagnose MRSA     infection nor to guide or     monitor treatment for     MRSA infections.   Medications: I have reviewed the patient's current medications. Scheduled Meds: . enoxaparin (LOVENOX) injection  40 mg Subcutaneous Q24H  . insulin aspart  0-15 Units Subcutaneous TID WC  . insulin aspart  0-5 Units Subcutaneous QHS  . insulin glargine  30 Units Subcutaneous QHS   Continuous Infusions: . sodium chloride 125 mL/hr at 01/08/13 1043   PRN Meds:.dextrose, morphine injection, ondansetron (ZOFRAN) IV  Assessment/Plan: Patient is a 42 year old female with DM1, seizure disorder, bipolar disorder, and questionable CKD Stage 3 who presents with DKA 2/2 possible medical non-adherence from infection.   #DKA: Posey Rea if abdominal pain is due solely to her DKA or may have had some viral illness that may have caused her to miss insulin and thus precipitate the DKA.   -  Transfer to floor bed today -Stopped D5 1/2NS and started D5 NS  -Continue insulin  -Replete K as needed; 4.2 on admission  -Diet clear liquids -Morphine 2 mg q3h prn for pain   #Questionable CKD Stage 3: Crt stable at 0.5 from yesterday. Per record, highest Crt value was 1.23 in January 2009. Will remove from her problem list given her Crt has ranged 0.4-0.6 since March 2013.   #Disposition: Pending resolution of symptoms but likely 1-2 days.  This is a Psychologist, occupational Note.  The care of the patient was discussed with Dr. Janalyn Harder and the assessment and plan formulated with their assistance.  Please see their attached note for official documentation of the daily encounter.   LOS: 1 day  Beather Arbour, Med Student 01/08/2013, 1:31 PM

## 2013-01-08 NOTE — Progress Notes (Signed)
Utilization review completed.  

## 2013-01-09 DIAGNOSIS — B182 Chronic viral hepatitis C: Secondary | ICD-10-CM

## 2013-01-09 LAB — BASIC METABOLIC PANEL
Chloride: 104 mEq/L (ref 96–112)
Chloride: 106 mEq/L (ref 96–112)
Creatinine, Ser: 0.47 mg/dL — ABNORMAL LOW (ref 0.50–1.10)
GFR calc Af Amer: >90 mL/min (ref >90)
GFR calc Af Amer: >90 mL/min (ref >90)
Potassium: 4.1 mEq/L (ref 3.5–5.1)
Potassium: 4.4 mEq/L (ref 3.5–5.1)
Sodium: 136 mEq/L (ref 135–145)
Sodium: 135 mEq/L (ref 135–145)

## 2013-01-09 LAB — GLUCOSE, CAPILLARY
Glucose-Capillary: 130 mg/dL — ABNORMAL HIGH (ref 70–99)
Glucose-Capillary: 171 mg/dL — ABNORMAL HIGH (ref 70–99)
Glucose-Capillary: 185 mg/dL — ABNORMAL HIGH (ref 70–99)
Glucose-Capillary: 252 mg/dL — ABNORMAL HIGH (ref 70–99)

## 2013-01-09 MED ORDER — INSULIN ASPART 100 UNIT/ML ~~LOC~~ SOLN
0.0000 [IU] | Freq: Three times a day (TID) | SUBCUTANEOUS | Status: DC
  Administered 2013-01-09: 11 [IU] via SUBCUTANEOUS

## 2013-01-09 MED ORDER — INSULIN ASPART 100 UNIT/ML ~~LOC~~ SOLN: 0.0000 [IU] | Freq: Every day | SUBCUTANEOUS | Status: DC

## 2013-01-09 MED ORDER — INSULIN GLARGINE 100 UNIT/ML ~~LOC~~ SOLN
40.0000 [IU] | Freq: Every day | SUBCUTANEOUS | Status: DC
  Administered 2013-01-09: 40 [IU] via SUBCUTANEOUS
  Filled 2013-01-09: qty 0.4

## 2013-01-09 MED ORDER — "INSULIN SYRINGE 31G X 5/16"" 0.3 ML MISC": Status: DC

## 2013-01-09 MED ORDER — SENNOSIDES-DOCUSATE SODIUM 8.6-50 MG PO TABS
2.0000 | ORAL_TABLET | Freq: Once | ORAL | Status: AC
  Administered 2013-01-09: 2 via ORAL
  Filled 2013-01-09: qty 2

## 2013-01-09 MED ORDER — LANCET DEVICE MISC: Status: DC

## 2013-01-09 MED ORDER — BLOOD GLUCOSE TEST VI STRP: ORAL_STRIP | Status: DC

## 2013-01-09 MED ORDER — HYDROCODONE-ACETAMINOPHEN 5-325 MG PO TABS: 1.0000 | ORAL_TABLET | ORAL | Status: DC | PRN

## 2013-01-09 MED ORDER — PROMETHAZINE HCL 25 MG PO TABS: 25.0000 mg | ORAL_TABLET | Freq: Four times a day (QID) | ORAL | Status: DC | PRN

## 2013-01-09 MED ORDER — INSULIN GLARGINE 100 UNIT/ML ~~LOC~~ SOLN: 50.0000 [IU] | Freq: Every day | SUBCUTANEOUS | Status: DC

## 2013-01-09 NOTE — Progress Notes (Signed)
Subjective: The patient notes significant improvement in abdominal pain this morning.  Anion gap closed yesterday evening, and remains closed at 8 this morning.  Objective: Vital signs in last 24 hours: Filed Vitals:   01/08/13 1512 01/08/13 1555 01/08/13 2212 01/09/13 0533  BP: 104/67 107/67 113/62 130/76  Pulse: 76 74 71 70  Temp: 98.5 F (36.9 C) 98.2 F (36.8 C) 98.8 F (37.1 C) 98.2 F (36.8 C)  TempSrc: Oral Oral Oral Oral  Resp: 15 19 18 18   Height:      Weight:      SpO2: 99% 100% 98% 99%   Weight change:   Intake/Output Summary (Last 24 hours) at 01/09/13 1059 Last data filed at 01/09/13 0534  Gross per 24 hour  Intake   1760 ml  Output      0 ml  Net   1760 ml  General: Lying in bed, appears uncomfortable HEENT: pupils equal round and reactive to light, vision grossly intact, oropharynx clear and non-erythematous  Neck: supple, no lymphadenopathy Lungs: clear to ascultation bilaterally, normal work of respiration, no wheezes, rales, ronchi Heart: regular rate and rhythm, no murmurs, gallops, or rubs Abdomen: soft, only minimally ttp, non-distended, +bs, no guarding or rebound tenderness Extremities: no cyanosis, clubbing, or edema Neurologic: alert & oriented X3, cranial nerves II-XII intact, strength grossly intact, sensation intact to light touch  Lab Results: Basic Metabolic Panel:  Recent Labs Lab 01/09/13 0010 01/09/13 0530  NA 136 135  K 4.1 4.4  CL 104 106  CO2 24 21  GLUCOSE 260* 211*  BUN 14 14  CREATININE 0.53 0.47*  CALCIUM 9.0 8.3*   Liver Function Tests:  Recent Labs Lab 01/07/13 0830  AST 59*  ALT 51*  ALKPHOS 103  BILITOT 0.3  PROT 7.8  ALBUMIN 3.9    Recent Labs Lab 01/07/13 0830 01/08/13 0722  LIPASE 33 13  AMYLASE  --  46   CBC:  Recent Labs Lab 01/07/13 0830 01/08/13 0722  WBC 11.6* 20.0*  NEUTROABS 8.6*  --   HGB 13.4 12.2  HCT 40.3 37.5  MCV 70.0* 71.4*  PLT 153 157   CBG:  Recent Labs Lab  01/09/13 0224 01/09/13 0325 01/09/13 0426 01/09/13 0531 01/09/13 0632 01/09/13 0746  GLUCAP 98 137* 171* 220* 165* 130*   Coagulation:  Recent Labs Lab 01/07/13 1044  LABPROT 12.6  INR 0.96   Urine Drug Screen: Drugs of Abuse     Component Value Date/Time   LABOPIA NONE DETECTED 01/07/2013 0853   COCAINSCRNUR NONE DETECTED 01/07/2013 0853   LABBENZ NONE DETECTED 01/07/2013 0853   AMPHETMU NONE DETECTED 01/07/2013 0853   THCU POSITIVE* 01/07/2013 0853   LABBARB NONE DETECTED 01/07/2013 0853    Urinalysis:  Recent Labs Lab 01/07/13 0853  COLORURINE YELLOW  LABSPEC 1.034*  PHURINE 6.5  GLUCOSEU >1000*  HGBUR NEGATIVE  BILIRUBINUR NEGATIVE  KETONESUR 40*  PROTEINUR NEGATIVE  UROBILINOGEN 0.2  NITRITE NEGATIVE  LEUKOCYTESUR NEGATIVE   Micro Results: Recent Results (from the past 240 hour(s))  MRSA PCR SCREENING     Status: None   Collection Time    01/07/13  1:34 PM      Result Value Range Status   MRSA by PCR NEGATIVE  NEGATIVE Final   Comment:            The GeneXpert MRSA Assay (FDA     approved for NASAL specimens     only), is one component of a  comprehensive MRSA colonization     surveillance program. It is not     intended to diagnose MRSA     infection nor to guide or     monitor treatment for     MRSA infections.   Studies/Results: US Abdomen Complete  01/07/2013   *RADIOLOGY REPORT*  Clinical Data:  Abdominal pain.  History of diabetes and hepatitis C.  COMPLETE ABDOMINAL ULTRASOUND  Comparison:  Abdominal ultrasound 11/19/2012.  Findings:  Gallbladder: Small gallstones are again noted.  There is debris and/or cholesterol polyps in the gallbladder as before.  There is no gallbladder wall thickening or pericholecystic fluid. Sonographic Murphy's sign is absent.  Common bile duct:   Normal in caliber without filling defects.  Liver:  Echogenicity is within normal limits.  No focal hepatic abnormalities are identified.  IVC:  Visualized portions appear  unremarkable.  Pancreas:  Visualized portions appear unremarkable.The pancreatic duct does not appear significantly dilated.  Spleen:  Visualized portions appear unremarkable.  Right Kidney:   The renal cortical thickness and echogenicity are preserved.  There is no hydronephrosis or focal abnormality. Renal length is 11.0 cm.  Left Kidney:   The renal cortical thickness and echogenicity are preserved.  There is no hydronephrosis or focal abnormality. Renal length is 11.2 cm.  Abdominal aorta:  Visualized portions appear unremarkable.  IMPRESSION:  1.  Stable cholelithiasis and suspected cholesterol polyps.  No evidence of cholecystitis. 2.  No acute findings demonstrated.   Original Report Authenticated By: Carey Bullocks, M.D.   Dg Chest Port 1 View  01/07/2013   *RADIOLOGY REPORT*  Clinical Data: Chest pain  PORTABLE CHEST - 1 VIEW  Comparison: None.  Findings: The heart and pulmonary vascularity are within normal limits.  The lungs are clear.  The upper abdomen is unremarkable. No bony abnormality is seen.  IMPRESSION: No acute abnormality noted.   Original Report Authenticated By: Alcide Clever, M.D.   Medications: I have reviewed the patient's current medications. Scheduled Meds: . enoxaparin (LOVENOX) injection  40 mg Subcutaneous Q24H  . insulin aspart  0-15 Units Subcutaneous TID WC  . insulin aspart  0-5 Units Subcutaneous QHS  . insulin glargine  40 Units Subcutaneous Daily   Continuous Infusions:  PRN Meds:.dextrose, morphine injection, ondansetron (ZOFRAN) IV Assessment/Plan: The patient is a 42 yo woman, history of DM, bipolar disorder, seizure disorder, HCV, presenting with DKA.   # DKA - the patient presented with an AG which peaked at 23, and a bicarb as low as 16. The patient's gap closed overnight on the insulin drip, and she was transitioned to subcutaneous Lantus this morning. The etiology of the DKA was likely a viral URI, along with inconsistent compliance with insulin. The  patient's WBC count increased this morning, though this is likely reactive from DKA. CXR neg, abd [place] neg.  -d/c insulin drip this morning -transition to Lantus 40 this morning  -continue SSI while inpatient -d/c IV fluids  # Diabetes Mellitus - unclear history. Per chart review, patient was diagnosed at age 89. There is mention that this may be Type I in her chart, but the patient is managed at home on metformin and Lantus, which is a more classic regimen for Type 2.  -treat DKA per above for now, transition to home regimen at discharge.  Since pt was given Lantus 40 this morning, will instruct her to take Lantus 25 tonight (50% dose), then return to her usual Lantus 50 qhs  # Abdominal pain - likely due  to DKA above. Improving. Abd [place] negative.   # HCV - history of HCV, with a positive viral load in 2010. LFT's at baseline this admission.   Dispo: D/c home today  The patient does have a current PCP Dartha Lodge, FNP) and does not need an [facility] hospital follow-up appointment after discharge.   .Services Needed at time of discharge: Y = Yes, Blank = No PT:   OT:   RN:   Equipment:   Other:     LOS: 2 days   Linward Headland, MD 01/09/2013, 10:59 AM

## 2013-01-09 NOTE — Discharge Summary (Signed)
Name: Mary [name] MRN: [id number] DOB: [birth year] 42 y.o. PCP: Mary Lodge, FNP  Date of Admission: 01/07/2013  8:19 AM Date of Discharge: 01/09/2013 Attending Physician: Mary Serene, MD  Discharge Diagnosis: 1. Diabetic Ketoacidosis - Resolved 2. Diabetes Mellitus type 2 3. Chronic Hepatitis C 4. Tobacco abuse  Discharge Medications:   Medication List         aspirin EC 81 MG tablet  Take 81 mg by mouth daily.     Blood Glucose Meter kit  Use as instructed     BLOOD GLUCOSE TEST STRIPS Strp  Use three times per day to check blood sugar     calcium carbonate 500 MG chewable tablet  Commonly known as:  TUMS - dosed in mg elemental calcium  Chew 2 tablets by mouth daily as needed. For heartburn     gabapentin 300 MG capsule  Commonly known as:  NEURONTIN  Take 300 mg by mouth 2 (two) times daily.     HYDROcodone-acetaminophen 5-325 MG per tablet  Commonly known as:  NORCO/VICODIN  Take 1 tablet by mouth every 4 (four) hours as needed for pain.     insulin glargine 100 UNIT/ML injection  Commonly known as:  LANTUS  Inject 0.5 mLs (50 Units total) into the skin at bedtime.     INSULIN SYRINGE .3CC/31GX5/16" 31G X 5/16" 0.3 ML Misc  Use to inject insulin daily     Lancet Device Misc  Use to check blood sugar     metFORMIN 500 MG tablet  Commonly known as:  GLUCOPHAGE  Take 500 mg by mouth at bedtime.     promethazine 25 MG tablet  Commonly known as:  PHENERGAN  Take 1 tablet (25 mg total) by mouth every 6 (six) hours as needed for nausea.     ranitidine 150 MG capsule  Commonly known as:  ZANTAC  Take 150 mg by mouth 2 (two) times daily as needed. For heartburn     risperidone 4 MG tablet  Commonly known as:  RISPERDAL  Take 4 mg by mouth daily.     traZODone 150 MG tablet  Commonly known as:  DESYREL  Take 150 mg by mouth at bedtime.        Disposition and follow-up:   [name] was discharged from [facility]  in Stable condition.  At the hospital follow up visit please address:  1.  Adjust insulin regimen based on CBG results  2.  Labs / imaging needed at time of follow-up: None  3.  Pending labs/ test needing follow-up: None  Follow-up Appointments:     Follow-up Information   Follow up with MaryANTHONY, FNP. Schedule an appointment as soon as possible for a visit in 2 weeks.   Contact information:   [address] [place] , [place] - [address] [phone]       Discharge Instructions: Discharge Orders   Future Orders Complete By Expires     Call MD for:  persistant nausea and vomiting  As directed     Call MD for:  severe uncontrolled pain  As directed     Call MD for:  temperature >100.4  As directed     Diet Carb Modified  As directed     Increase activity slowly  As directed        Consultations: None  Procedures Performed:  US Abdomen Complete  01/07/2013   *RADIOLOGY REPORT*  Clinical Data:  Abdominal pain.  History  of diabetes and hepatitis C.  COMPLETE ABDOMINAL ULTRASOUND  Comparison:  Abdominal ultrasound 11/19/2012.  Findings:  Gallbladder: Small gallstones are again noted.  There is debris and/or cholesterol polyps in the gallbladder as before.  There is no gallbladder wall thickening or pericholecystic fluid. Sonographic Murphy's sign is absent.  Common bile duct:   Normal in caliber without filling defects.  Liver:  Echogenicity is within normal limits.  No focal hepatic abnormalities are identified.  IVC:  Visualized portions appear unremarkable.  Pancreas:  Visualized portions appear unremarkable.The pancreatic duct does not appear significantly dilated.  Spleen:  Visualized portions appear unremarkable.  Right Kidney:   The renal cortical thickness and echogenicity are preserved.  There is no hydronephrosis or focal abnormality. Renal length is 11.0 cm.  Left Kidney:   The renal cortical thickness and echogenicity are preserved.  There is no hydronephrosis or focal  abnormality. Renal length is 11.2 cm.  Abdominal aorta:  Visualized portions appear unremarkable.  IMPRESSION:  1.  Stable cholelithiasis and suspected cholesterol polyps.  No evidence of cholecystitis. 2.  No acute findings demonstrated.   Original Report Authenticated By: Carey Bullocks, M.D.   Dg Chest Port 1 View  01/07/2013   *RADIOLOGY REPORT*  Clinical Data: Chest pain  PORTABLE CHEST - 1 VIEW  Comparison: None.  Findings: The heart and pulmonary vascularity are within normal limits.  The lungs are clear.  The upper abdomen is unremarkable. No bony abnormality is seen.  IMPRESSION: No acute abnormality noted.   Original Report Authenticated By: Alcide Clever, M.D.    Admission HPI:  The patient is a 42 YO woman who is coming into the ED with abdominal pain, with nausea. She is unable to provide much history as her mental status is waxing and waning and she is inconsolable at times from the pain. She is able to describe that the pain started this morning and she has had it before. She does have problems with her sugars and states that she takes lantus 50 units at night. She states that she thinks she is taking her lantus but is out of strips and unable to check her sugars for weeks. She denied chest pain, vomiting, diarrhea, constipation, chest pain. She is having back and leg pain which she states are not changed from usual and she is usually in pain. She is not cooperative with the history much more but does note that she has not had any fevers or chills.   Hospital Course by problem list: 1. Diabetic Ketoacidosis - The patient presented with DKA, with a maximum AG of 23.  The patient was treated with IV fluids and an insulin drip, with resolution of anion gap.  The etiology for this event was likely a viral URI in combination with medication non-compliance.  Infectious work-up, including UA and CXR, was negative.  The patient experienced abdominal pain as a result of DKA, but lipase level and abd [place]  was negative, and symptoms significantly improved by the time of discharge.  The patient was discharged on her home metformin and Lantus regimen for her diabetes, along with refills of insulin syringes and glucose test strips.  Discharge Vitals:   BP 130/76  Pulse 70  Temp(Src) 98.2 F (36.8 C) (Oral)  Resp 18  Ht 5\' 4"  (1.626 m)  Wt 116 lb 2.9 oz (52.7 kg)  BMI 19.93 kg/m2  SpO2 99%  Discharge Labs:  Results for orders placed during the hospital encounter of 01/07/13 (from the past 24  hour(s))  GLUCOSE, CAPILLARY     Status: Abnormal   Collection Time    01/08/13 12:23 PM      Result Value Range   Glucose-Capillary 283 (*) 70 - 99 mg/dL   Comment 1 Notify RN    BASIC METABOLIC PANEL     Status: Abnormal   Collection Time    01/08/13  2:03 PM      Result Value Range   Sodium 135  135 - 145 mEq/L   Potassium 4.4  3.5 - 5.1 mEq/L   Chloride 102  96 - 112 mEq/L   CO2 18 (*) 19 - 32 mEq/L   Glucose, Bld 266 (*) 70 - 99 mg/dL   BUN 18  6 - 23 mg/dL   Creatinine, Ser 0.98  0.50 - 1.10 mg/dL   Calcium 8.9  8.4 - 11.9 mg/dL   GFR calc non Af Amer >90  >90 mL/min   GFR calc Af Amer >90  >90 mL/min  GLUCOSE, CAPILLARY     Status: Abnormal   Collection Time    01/08/13  4:57 PM      Result Value Range   Glucose-Capillary 261 (*) 70 - 99 mg/dL  BASIC METABOLIC PANEL     Status: Abnormal   Collection Time    01/08/13  6:20 PM      Result Value Range   Sodium 139  135 - 145 mEq/L   Potassium 3.7  3.5 - 5.1 mEq/L   Chloride 105  96 - 112 mEq/L   CO2 23  19 - 32 mEq/L   Glucose, Bld 115 (*) 70 - 99 mg/dL   BUN 15  6 - 23 mg/dL   Creatinine, Ser 1.47  0.50 - 1.10 mg/dL   Calcium 9.0  8.4 - 82.9 mg/dL   GFR calc non Af Amer >90  >90 mL/min   GFR calc Af Amer >90  >90 mL/min  GLUCOSE, CAPILLARY     Status: Abnormal   Collection Time    01/08/13 10:11 PM      Result Value Range   Glucose-Capillary 231 (*) 70 - 99 mg/dL  GLUCOSE, CAPILLARY     Status: Abnormal   Collection Time     01/08/13 11:13 PM      Result Value Range   Glucose-Capillary 261 (*) 70 - 99 mg/dL  BASIC METABOLIC PANEL     Status: Abnormal   Collection Time    01/09/13 12:10 AM      Result Value Range   Sodium 136  135 - 145 mEq/L   Potassium 4.1  3.5 - 5.1 mEq/L   Chloride 104  96 - 112 mEq/L   CO2 24  19 - 32 mEq/L   Glucose, Bld 260 (*) 70 - 99 mg/dL   BUN 14  6 - 23 mg/dL   Creatinine, Ser 5.62  0.50 - 1.10 mg/dL   Calcium 9.0  8.4 - 13.0 mg/dL   GFR calc non Af Amer >90  >90 mL/min   GFR calc Af Amer >90  >90 mL/min  GLUCOSE, CAPILLARY     Status: Abnormal   Collection Time    01/09/13 12:21 AM      Result Value Range   Glucose-Capillary 252 (*) 70 - 99 mg/dL  GLUCOSE, CAPILLARY     Status: Abnormal   Collection Time    01/09/13  1:24 AM      Result Value Range   Glucose-Capillary 185 (*) 70 - 99 mg/dL  GLUCOSE, CAPILLARY     Status: None   Collection Time    01/09/13  2:24 AM      Result Value Range   Glucose-Capillary 98  70 - 99 mg/dL  GLUCOSE, CAPILLARY     Status: Abnormal   Collection Time    01/09/13  3:25 AM      Result Value Range   Glucose-Capillary 137 (*) 70 - 99 mg/dL  GLUCOSE, CAPILLARY     Status: Abnormal   Collection Time    01/09/13  4:26 AM      Result Value Range   Glucose-Capillary 171 (*) 70 - 99 mg/dL  BASIC METABOLIC PANEL     Status: Abnormal   Collection Time    01/09/13  5:30 AM      Result Value Range   Sodium 135  135 - 145 mEq/L   Potassium 4.4  3.5 - 5.1 mEq/L   Chloride 106  96 - 112 mEq/L   CO2 21  19 - 32 mEq/L   Glucose, Bld 211 (*) 70 - 99 mg/dL   BUN 14  6 - 23 mg/dL   Creatinine, Ser 8.65 (*) 0.50 - 1.10 mg/dL   Calcium 8.3 (*) 8.4 - 10.5 mg/dL   GFR calc non Af Amer >90  >90 mL/min   GFR calc Af Amer >90  >90 mL/min  GLUCOSE, CAPILLARY     Status: Abnormal   Collection Time    01/09/13  5:31 AM      Result Value Range   Glucose-Capillary 220 (*) 70 - 99 mg/dL  GLUCOSE, CAPILLARY     Status: Abnormal   Collection Time      01/09/13  6:32 AM      Result Value Range   Glucose-Capillary 165 (*) 70 - 99 mg/dL  GLUCOSE, CAPILLARY     Status: Abnormal   Collection Time    01/09/13  7:46 AM      Result Value Range   Glucose-Capillary 130 (*) 70 - 99 mg/dL   Comment 1 Notify RN      Signed: Linward Headland, MD 01/09/2013, 11:27 AM   Time Spent on Discharge: 45 minutes Services Ordered on Discharge: None Equipment Ordered on Discharge: Insulin syringes, test strips

## 2013-01-09 NOTE — Progress Notes (Signed)
Discharge instructions gone over with patient. Home medications gone over. Follow up appointment to be made. Prescriptions given and prescription called in to [facility]. Diet and diabetes care discussed. Patient encouraged to be consistent with taking blood glucose levels and treating with oral medications and insulin. Patient given information on DKA. We discussed being compliant with medications and what to do on "sick days". Patient stated she is going to do better, "Don't want to go through this again."

## 2013-02-06 ENCOUNTER — Emergency Department (HOSPITAL_COMMUNITY)
Admission: EM | Admit: 2013-02-06 | Discharge: 2013-02-06 | Disposition: A | Discharge status: Home / Self Care | Payer: Medicaid Other | Attending: Emergency Medicine | Admitting: Emergency Medicine

## 2013-02-06 ENCOUNTER — Encounter (HOSPITAL_COMMUNITY): Payer: Self-pay

## 2013-02-06 DIAGNOSIS — F4310 Post-traumatic stress disorder, unspecified: Secondary | ICD-10-CM | POA: Insufficient documentation

## 2013-02-06 DIAGNOSIS — Z87442 Personal history of urinary calculi: Secondary | ICD-10-CM | POA: Insufficient documentation

## 2013-02-06 DIAGNOSIS — Z9889 Other specified postprocedural states: Secondary | ICD-10-CM | POA: Insufficient documentation

## 2013-02-06 DIAGNOSIS — G589 Mononeuropathy, unspecified: Secondary | ICD-10-CM | POA: Insufficient documentation

## 2013-02-06 DIAGNOSIS — I739 Peripheral vascular disease, unspecified: Secondary | ICD-10-CM | POA: Insufficient documentation

## 2013-02-06 DIAGNOSIS — Z794 Long term (current) use of insulin: Secondary | ICD-10-CM | POA: Diagnosis not present

## 2013-02-06 DIAGNOSIS — M129 Arthropathy, unspecified: Secondary | ICD-10-CM | POA: Diagnosis not present

## 2013-02-06 DIAGNOSIS — Z8679 Personal history of other diseases of the circulatory system: Secondary | ICD-10-CM | POA: Diagnosis not present

## 2013-02-06 DIAGNOSIS — Z880 Allergy status to penicillin: Secondary | ICD-10-CM | POA: Insufficient documentation

## 2013-02-06 DIAGNOSIS — Z8619 Personal history of other infectious and parasitic diseases: Secondary | ICD-10-CM | POA: Diagnosis not present

## 2013-02-06 DIAGNOSIS — Z79899 Other long term (current) drug therapy: Secondary | ICD-10-CM | POA: Insufficient documentation

## 2013-02-06 DIAGNOSIS — N764 Abscess of vulva: Secondary | ICD-10-CM | POA: Diagnosis present

## 2013-02-06 DIAGNOSIS — Z8719 Personal history of other diseases of the digestive system: Secondary | ICD-10-CM | POA: Diagnosis not present

## 2013-02-06 DIAGNOSIS — Z91199 Patient's noncompliance with other medical treatment and regimen due to unspecified reason: Secondary | ICD-10-CM | POA: Insufficient documentation

## 2013-02-06 DIAGNOSIS — F17200 Nicotine dependence, unspecified, uncomplicated: Secondary | ICD-10-CM | POA: Insufficient documentation

## 2013-02-06 DIAGNOSIS — E119 Type 2 diabetes mellitus without complications: Secondary | ICD-10-CM | POA: Insufficient documentation

## 2013-02-06 DIAGNOSIS — Z7982 Long term (current) use of aspirin: Secondary | ICD-10-CM | POA: Insufficient documentation

## 2013-02-06 DIAGNOSIS — G40909 Epilepsy, unspecified, not intractable, without status epilepticus: Secondary | ICD-10-CM | POA: Diagnosis not present

## 2013-02-06 DIAGNOSIS — Z9119 Patient's noncompliance with other medical treatment and regimen: Secondary | ICD-10-CM | POA: Diagnosis not present

## 2013-02-06 DIAGNOSIS — Z862 Personal history of diseases of the blood and blood-forming organs and certain disorders involving the immune mechanism: Secondary | ICD-10-CM | POA: Diagnosis not present

## 2013-02-06 DIAGNOSIS — F3189 Other bipolar disorder: Secondary | ICD-10-CM | POA: Diagnosis not present

## 2013-02-06 DIAGNOSIS — R011 Cardiac murmur, unspecified: Secondary | ICD-10-CM | POA: Insufficient documentation

## 2013-02-06 MED ORDER — DOXYCYCLINE HYCLATE 100 MG PO CAPS: 100.0000 mg | ORAL_CAPSULE | Freq: Two times a day (BID) | ORAL | Status: DC

## 2013-02-06 MED ORDER — HYDROMORPHONE HCL PF 2 MG/ML IJ SOLN
2.0000 mg | Freq: Once | INTRAMUSCULAR | Status: AC
  Administered 2013-02-06: 2 mg via INTRAMUSCULAR
  Filled 2013-02-06: qty 1

## 2013-02-06 MED ORDER — ONDANSETRON 8 MG PO TBDP
8.0000 mg | ORAL_TABLET | Freq: Once | ORAL | Status: AC
  Administered 2013-02-06: 8 mg via ORAL
  Filled 2013-02-06: qty 1

## 2013-02-06 MED ORDER — HYDROCODONE-ACETAMINOPHEN 5-325 MG PO TABS: 1.0000 | ORAL_TABLET | ORAL | Status: DC | PRN

## 2013-02-06 NOTE — ED Provider Notes (Signed)
Medical screening examination/treatment/procedure(s) were performed by non-physician practitioner and as supervising physician I was immediately available for consultation/collaboration. Devoria Albe, MD, Armando Gang   Ward Givens, MD 02/06/13 [phone]

## 2013-02-06 NOTE — ED Notes (Signed)
Bed: ZO10 Expected date: 02/06/13 Expected time: 8:42 AM Means of arrival: Ambulance Comments: Vag Pain

## 2013-02-06 NOTE — ED Notes (Signed)
Pt from [facility] via [facility] c/o vaginal abscess. Pt reports that a small bump started a few days ago, today is swollen and very painful. [facility] reports that pt was uncooperative and screamed during transport. Pt A&O and in NAD

## 2013-02-06 NOTE — ED Provider Notes (Signed)
CSN: [id number]     Arrival date & time 02/06/13  0865 History     First MD Initiated Contact with Patient 02/06/13 0912     Chief Complaint  Patient presents with  . Abscess   (Consider location/radiation/quality/duration/timing/severity/associated sxs/prior Treatment) HPI Comments: Patient reports she noticed a knot on her labia "earlier this week."   States it has gradually gotten bigger and more painful.  Pain is now constant and severe, it does not radiate. Denies any drainage.  Denies fevers, chills, abnormal vaginal discharge or bleeding, urinary symptoms.  LMP Aug 1.     Patient is a 42 y.o. female presenting with abscess. The history is provided by the patient.  Abscess Associated symptoms: no fever, no nausea and no vomiting     Past Medical History  Diagnosis Date  . Diabetes mellitus     diagnosed in 1996-always been on insulin  . Kidney stone   . Seizure disorder     started with pregnancy of first son  . Gastritis   . Bipolar 2 disorder   . Post traumatic stress disorder     "flipping out" after people close to her died  . DKA (diabetic ketoacidoses)     Recurrent admissions for DKA, medication non-complaince, poor social situation  . Heart murmur   . Arthritis     r ankle-S/P surgery 10-30-05)  . Peripheral vascular disease     numbness bilaterally feet  . Anemia     childhood  . Depression     childhood  . Hepatitis C     biopsy in 2010-no rx-was supposed to see a hepatologist in [place].  With cirrhosis  . Neuropathy     in legs and feet and hands   Past Surgical History  Procedure Laterality Date  . Ankle surgery    . Cesarean section    . Endometrial biopsy  06/22/2012   Family History  Problem Relation Age of Onset  . Diabetes Mother     currently 4  . Fibromyalgia Mother   . Cirrhosis Father     died in 1997-10-30   History  Substance Use Topics  . Smoking status: Current Every Day Smoker -- 0.10 packs/day for 25 years    Types:  Cigarettes  . Smokeless tobacco: Never Used  . Alcohol Use: No     Comment: Endorses hasn't been drinking   OB History   Grav Para Term Preterm Abortions TAB SAB Ect Mult Living   4 3 3  0 1 0 1 0 1 2     Review of Systems  Constitutional: Negative for fever and chills.  Gastrointestinal: Negative for nausea, vomiting and abdominal pain.  Genitourinary: Negative for dysuria, urgency, frequency, vaginal bleeding and vaginal discharge.  Skin:       Lesion (see HPI)    Allergies  Penicillins  Home Medications   Current Outpatient Rx  Name  Route  Sig  Dispense  Refill  . aspirin EC 81 MG tablet   Oral   Take 81 mg by mouth daily.          . Blood Glucose Monitoring Suppl (BLOOD GLUCOSE METER) kit      Use as instructed   1 each   0     Diabetes mellitus 250.00. Uncontrolled, Insulin de ...   . calcium carbonate (TUMS - DOSED IN MG ELEMENTAL CALCIUM) 500 MG chewable tablet   Oral   Chew 2 tablets by mouth daily as needed. For heartburn         .  gabapentin (NEURONTIN) 300 MG capsule   Oral   Take 300 mg by mouth 2 (two) times daily.         . Glucose Blood (BLOOD GLUCOSE TEST STRIPS) STRP      Use three times per day to check blood sugar   100 each   11   . HYDROcodone-acetaminophen (NORCO/VICODIN) 5-325 MG per tablet   Oral   Take 1 tablet by mouth every 4 (four) hours as needed for pain.   15 tablet   0   . insulin glargine (LANTUS) 100 UNIT/ML injection   Subcutaneous   Inject 0.5 mLs (50 Units total) into the skin at bedtime.   10 mL   12   . Insulin Syringe-Needle U-100 (INSULIN SYRINGE .3CC/31GX5/16") 31G X 5/16" 0.3 ML MISC      Use to inject insulin daily   100 each   11   . Lancet Device MISC      Use to check blood sugar   100 each   1   . metFORMIN (GLUCOPHAGE) 500 MG tablet   Oral   Take 500 mg by mouth at bedtime.         . promethazine (PHENERGAN) 25 MG tablet   Oral   Take 1 tablet (25 mg total) by mouth every 6 (six)  hours as needed for nausea.   20 tablet   0   . ranitidine (ZANTAC) 150 MG capsule   Oral   Take 150 mg by mouth 2 (two) times daily as needed. For heartburn         . risperidone (RISPERDAL) 4 MG tablet   Oral   Take 4 mg by mouth daily.         . traZODone (DESYREL) 150 MG tablet   Oral   Take 150 mg by mouth at bedtime.          BP 129/86  Pulse 78  Temp(Src) 97.8 F (36.6 C) (Oral)  Resp 20  SpO2 100%  LMP 01/15/2013 Physical Exam  Nursing note and vitals reviewed. Constitutional: She appears well-developed and well-nourished. No distress.  HENT:  Head: Normocephalic and atraumatic.  Neck: Neck supple.  Pulmonary/Chest: Effort normal.  Genitourinary:     Neurological: She is alert.  Skin: She is not diaphoretic.    ED Course   Procedures (including critical care time)  Labs Reviewed - No data to display No results found.  INCISION AND DRAINAGE Performed by: Trixie Dredge B Consent: Verbal consent obtained. Risks and benefits: risks, benefits and alternatives were discussed Type: abscess  Body area: right labia majora  Anesthesia: local infiltration  Incision was made with a scalpel.  Local anesthetic: lidocaine 2% no epinephrine  Anesthetic total: 4 ml  Complexity: complex Blunt dissection to break up loculations  Drainage: purulent  Drainage amount: large  Packing material: none  Patient tolerance: Patient tolerated the procedure well with no immediate complications.      1. Labial abscess     MDM  Pt with right labial abscess.  Afebrile, nontoxic.  Abscess did not appear to be related to bartholin gland.  I&D performed in ED.  Given location and patient's diabetes, patient placed on doxycycline.  Also given norco for pain.  Discussed strict return precautions. Discussed  findings, treatment, follow up  with patient.  Pt given return precautions.  Pt verbalizes understanding and agrees with plan.     Trixie Dredge,  PA-C 02/06/13 1133

## 2013-02-07 NOTE — ED Notes (Signed)
Pharmacy called requesting change in antibiotic due to cost. Pharmacy spoke with prescriber [facility] PA. RX changed to Bactrim DS per [place] PA.

## 2013-02-09 LAB — CULTURE, ROUTINE-ABSCESS

## 2013-02-10 ENCOUNTER — Telehealth (HOSPITAL_COMMUNITY): Payer: Self-pay | Admitting: *Deleted

## 2013-02-10 NOTE — ED Notes (Signed)
Post ED Visit - Positive Culture Follow-up: Successful Patient Follow-Up  Culture assessed and recommendations reviewed by: []  Wes Dulaney, Pharm.D., BCPS []  Celedonio Miyamoto, Pharm.D., BCPS []  Georgina Pillion, Pharm.D., BCPS []  [place], [place].D., BCPS, AAHIVP [x]  Estella Husk, Pharm.D., BCPS, AAHIVP  Positive urine culture  []  Patient discharged without antimicrobial prescription and treatment is now indicated [x]  Organism is resistant to prescribed ED discharge antimicrobial []  Patient with positive blood cultures  Changes discussed with ED provider:Tiffany Neva Seat New antibiotic prescription Clindamycin 300 mg po TID x 7 days   Larena Sox 02/10/2013, 3:37 PM

## 2013-02-10 NOTE — Progress Notes (Signed)
ED Antimicrobial Stewardship Positive Culture Follow Up   Mary [name] is an 42 y.o. female who presented to [facility] on 02/06/2013 with a chief complaint of  Chief Complaint  Patient presents with  . Abscess    Recent Results (from the past 720 hour(s))  CULTURE, ROUTINE-ABSCESS     Status: None   Collection Time    02/06/13 11:21 AM      Result Value Range Status   Specimen Description LABIA   Final   Special Requests NONE   Final   Gram Stain     Final   Value: ABUNDANT WBC PRESENT, PREDOMINANTLY PMN     NO SQUAMOUS EPITHELIAL CELLS SEEN     FEW GRAM POSITIVE COCCI IN PAIRS     Performed at [facility]   Culture     Final   Value: ABUNDANT GROUP B STREP(S.AGALACTIAE)ISOLATED     Note: TESTING AGAINST S. AGALACTIAE NOT ROUTINELY PERFORMED DUE TO PREDICTABILITY OF AMP/PEN/VAN SUSCEPTIBILITY.     Performed at [facility]   Report Status 02/09/2013 FINAL   Final    [x]  Treated with Bactrim, organism resistant to prescribed antimicrobial  New antibiotic prescription: Stop Bactrim.  Start Clindamycin 300mg  PO TID x 7 days.    ED Provider: Marlon Pel, PA-C  Sallee Provencal 02/10/2013, 11:30 AM Infectious Diseases Pharmacist Phone# [phone]

## 2013-02-25 ENCOUNTER — Emergency Department (INDEPENDENT_AMBULATORY_CARE_PROVIDER_SITE_OTHER)
Admission: EM | Admit: 2013-02-25 | Discharge: 2013-02-25 | Disposition: A | Discharge status: Home / Self Care | Payer: No Typology Code available for payment source | Source: Home / Self Care | Attending: Emergency Medicine | Admitting: Emergency Medicine

## 2013-02-25 ENCOUNTER — Other Ambulatory Visit (HOSPITAL_COMMUNITY)
Admission: RE | Admit: 2013-02-25 | Discharge: 2013-02-25 | Disposition: A | Discharge status: Home / Self Care | Payer: No Typology Code available for payment source | Source: Ambulatory Visit | Attending: Emergency Medicine | Admitting: Emergency Medicine

## 2013-02-25 ENCOUNTER — Encounter (HOSPITAL_COMMUNITY): Payer: Self-pay | Admitting: Emergency Medicine

## 2013-02-25 DIAGNOSIS — Z113 Encounter for screening for infections with a predominantly sexual mode of transmission: Secondary | ICD-10-CM | POA: Insufficient documentation

## 2013-02-25 DIAGNOSIS — N760 Acute vaginitis: Secondary | ICD-10-CM | POA: Insufficient documentation

## 2013-02-25 DIAGNOSIS — N920 Excessive and frequent menstruation with regular cycle: Secondary | ICD-10-CM

## 2013-02-25 LAB — POCT URINALYSIS DIP (DEVICE)
Bilirubin Urine: NEGATIVE
Nitrite: NEGATIVE
Protein, ur: NEGATIVE mg/dL
pH: 6 (ref 5.0–8.0)

## 2013-02-25 MED ORDER — MEDROXYPROGESTERONE ACETATE 10 MG PO TABS: 10.0000 mg | ORAL_TABLET | Freq: Every day | ORAL | Status: DC

## 2013-02-25 MED ORDER — HYDROCODONE-ACETAMINOPHEN 5-325 MG PO TABS
ORAL_TABLET | ORAL | Status: DC
Start: 2013-02-25 — End: 2013-04-01

## 2013-02-25 MED ORDER — CEFTRIAXONE SODIUM 250 MG IJ SOLR
INTRAMUSCULAR | Status: AC
Start: 2013-02-25 — End: 2013-02-25
  Filled 2013-02-25: qty 250

## 2013-02-25 MED ORDER — AZITHROMYCIN 250 MG PO TABS
ORAL_TABLET | ORAL | Status: AC
  Filled 2013-02-25: qty 4

## 2013-02-25 MED ORDER — AZITHROMYCIN 250 MG PO TABS
1000.0000 mg | ORAL_TABLET | Freq: Once | ORAL | Status: AC
  Administered 2013-02-25: 1000 mg via ORAL

## 2013-02-25 MED ORDER — METRONIDAZOLE 500 MG PO TABS: 500.0000 mg | ORAL_TABLET | Freq: Three times a day (TID) | ORAL | Status: DC

## 2013-02-25 MED ORDER — LIDOCAINE HCL (PF) 1 % IJ SOLN
INTRAMUSCULAR | Status: AC
  Filled 2013-02-25: qty 5

## 2013-02-25 MED ORDER — CEFTRIAXONE SODIUM 250 MG IJ SOLR
250.0000 mg | Freq: Once | INTRAMUSCULAR | Status: AC
  Administered 2013-02-25: 250 mg via INTRAMUSCULAR

## 2013-02-25 NOTE — ED Provider Notes (Signed)
Chief Complaint:   Chief Complaint  Patient presents with  . Menorrhagia    History of Present Illness:   Mary [name] is a 42 year old female who has been having heavy, painful menses for the past year. She is seen a gynecologist at the end of July and was given Depo-Provera. Ever since then she's continued to have heavy bleeding, cramping, and clots. Current menses began 2 weeks ago. She has had some crampy pelvic pain radiating through to her back. She denies any fever, chills, nausea, or vomiting. No urinary symptoms. She's never had a pelvic ultrasound. She is sexually active without any use of condoms.  Review of Systems:  Other than noted above, the patient denies any of the following symptoms: Systemic:  No fever, chills, sweats, or weight loss. GI:  No abdominal pain, nausea, anorexia, vomiting, diarrhea, constipation, melena or hematochezia. GU:  No dysuria, frequency, urgency, hematuria, vaginal discharge, itching, or abnormal vaginal bleeding. Skin:  No rash or itching.  PMFSH:  Past medical history, family history, social history, meds, and allergies were reviewed.  She is allergic to penicillin. She has diabetes and hepatitis C. She's on a long list of medications including aspirin, Neurontin, glipizide, hydrochlorothiazide, Lantus insulin, Mobic, metformin, metoclopramide, Risperdal and, and Januvia.  Physical Exam:   Vital signs:  BP 100/64  Pulse 89  Temp(Src) 98.5 F (36.9 C) (Oral)  Resp 16  SpO2 100%  LMP 02/14/2013 General:  Alert, oriented and in no distress. Lungs:  Breath sounds clear and equal bilaterally.  No wheezes, rales or rhonchi. Heart:  Regular rhythm.  No gallops or murmers. Abdomen:  Soft, flat and non-distended.  No organomegaly or mass.  No tenderness, guarding or rebound.  Bowel sounds normally active. Pelvic exam:  Normal external genitalia there was a small amount of blood in the vaginal vault. No discharge. There was some odor. She has moderate  pain with cervical motion. Uterus is normal in size and shape but moderately tender to palpation. There is moderate bilateral adnexal tenderness without any masses. DNA probes for gonorrhea, Chlamydia, Trichomonas, Gardnerella, Candida were obtained. Skin:  Clear, warm and dry.  Labs:   Results for orders placed during the hospital encounter of 02/25/13  POCT URINALYSIS DIP (DEVICE)      Result Value Range   Glucose, UA NEGATIVE  NEGATIVE mg/dL   Bilirubin Urine NEGATIVE  NEGATIVE   Ketones, ur NEGATIVE  NEGATIVE mg/dL   Specific Gravity, Urine 1.020  1.005 - 1.030   Hgb urine dipstick LARGE (*) NEGATIVE   pH 6.0  5.0 - 8.0   Protein, ur NEGATIVE  NEGATIVE mg/dL   Urobilinogen, UA 0.2  0.0 - 1.0 mg/dL   Nitrite NEGATIVE  NEGATIVE   Leukocytes, UA NEGATIVE  NEGATIVE  POCT PREGNANCY, URINE      Result Value Range   Preg Test, Ur NEGATIVE  NEGATIVE     Course in Urgent Care Center:   Given Rocephin 250 mg IM and azithromycin 1000 mg by mouth.  Assessment:  The encounter diagnosis was Menorrhagia.  Differential diagnosis is extensive including dysfunctional uterine bleeding, fibroid tumor, endometriosis, PID, ovarian cyst, or endometrial carcinoma. The patient will need a complete workup including ultrasound and endometrial biopsy. She'll need to return to her gynecologist for this. In the meantime, Provera 10 mg a day should stop the bleeding. She was given some hydrocodone for the pain.  Plan:   1.  Meds:  The following meds were prescribed:   Discharge Medication  List as of 02/25/2013  6:12 PM    START taking these medications   Details  !! HYDROcodone-acetaminophen (NORCO/VICODIN) 5-325 MG per tablet 1 to 2 tabs every 4 to 6 hours as needed for pain., Print    medroxyPROGESTERone (PROVERA) 10 MG tablet Take 1 tablet (10 mg total) by mouth daily., Starting 02/25/2013, Until Discontinued, Normal    metroNIDAZOLE (FLAGYL) 500 MG tablet Take 1 tablet (500 mg total) by mouth 3 (three)  times daily., Starting 02/25/2013, Until Discontinued, Normal     !! - Potential duplicate medications found. Please discuss with provider.      2.  Patient Education/Counseling:  The patient was given appropriate handouts, self care instructions, and instructed in symptomatic relief.  Suggested she avoid intercourse until she sees a gynecologist.  3.  Follow up:  The patient was told to follow up if no better in 3 to 4 days, if becoming worse in any way, and given some red flag symptoms such as worsening of the bleeding or pain which would prompt immediate return.  Follow up at [facility] hospital clinics next week.     Reuben Likes, MD 02/25/13 2133

## 2013-02-25 NOTE — ED Notes (Addendum)
Pt c/o heavy, prolonged bleeding and cramping. Pt states she had the Depo shot July 29 and the two periods since the shot have been severely painful with heavy bleeding. Pt states periods are lasting 12 days since the shot. Pt denies taking meds for sxs. Jan Ranson, SMA

## 2013-03-02 ENCOUNTER — Telehealth (HOSPITAL_COMMUNITY): Payer: Self-pay | Admitting: *Deleted

## 2013-03-02 NOTE — ED Notes (Signed)
GC/Chlamydia neg., Affirm: Candida neg., Gardnerella and Trich pos.  Pt. adequately treated with Flagyl.  I called pt. and left a message to call. Vassie Moselle 03/02/2013

## 2013-03-03 ENCOUNTER — Telehealth (HOSPITAL_COMMUNITY): Payer: Self-pay | Admitting: *Deleted

## 2013-03-03 NOTE — ED Notes (Signed)
I called pt.  Pt. verified x 2 and given results.  Pt. told she is adequately treated for bacterial vaginosis and Trich with Flagyl and to finish all of her medication.  Pt. instructed to notify her partner to be treated with Flagyl, no sex until you have finished your medication and your partner has been treated and to practice safe sex. You can get HIV testing at the [facility] clinic, by appointment.  Pt.'s questions about Trich answered.  Pt. voiced understanding. Vassie Moselle 03/03/2013

## 2013-03-12 ENCOUNTER — Emergency Department (HOSPITAL_COMMUNITY): Payer: No Typology Code available for payment source

## 2013-03-12 ENCOUNTER — Inpatient Hospital Stay (HOSPITAL_COMMUNITY)
Admission: EM | Admit: 2013-03-12 | Discharge: 2013-03-16 | DRG: 638 | Disposition: A | Discharge status: Home / Self Care | Payer: No Typology Code available for payment source | Attending: Internal Medicine | Admitting: Internal Medicine

## 2013-03-12 DIAGNOSIS — B1920 Unspecified viral hepatitis C without hepatic coma: Secondary | ICD-10-CM | POA: Diagnosis present

## 2013-03-12 DIAGNOSIS — F3189 Other bipolar disorder: Secondary | ICD-10-CM | POA: Diagnosis present

## 2013-03-12 DIAGNOSIS — F4310 Post-traumatic stress disorder, unspecified: Secondary | ICD-10-CM | POA: Diagnosis present

## 2013-03-12 DIAGNOSIS — F3181 Bipolar II disorder: Secondary | ICD-10-CM | POA: Diagnosis present

## 2013-03-12 DIAGNOSIS — Z91199 Patient's noncompliance with other medical treatment and regimen due to unspecified reason: Secondary | ICD-10-CM

## 2013-03-12 DIAGNOSIS — Z79899 Other long term (current) drug therapy: Secondary | ICD-10-CM

## 2013-03-12 DIAGNOSIS — D72829 Elevated white blood cell count, unspecified: Secondary | ICD-10-CM | POA: Diagnosis present

## 2013-03-12 DIAGNOSIS — I739 Peripheral vascular disease, unspecified: Secondary | ICD-10-CM | POA: Diagnosis present

## 2013-03-12 DIAGNOSIS — F319 Bipolar disorder, unspecified: Secondary | ICD-10-CM | POA: Diagnosis present

## 2013-03-12 DIAGNOSIS — E1065 Type 1 diabetes mellitus with hyperglycemia: Secondary | ICD-10-CM | POA: Diagnosis present

## 2013-03-12 DIAGNOSIS — G589 Mononeuropathy, unspecified: Secondary | ICD-10-CM | POA: Diagnosis present

## 2013-03-12 DIAGNOSIS — IMO0002 Reserved for concepts with insufficient information to code with codable children: Secondary | ICD-10-CM | POA: Diagnosis present

## 2013-03-12 DIAGNOSIS — Z794 Long term (current) use of insulin: Secondary | ICD-10-CM

## 2013-03-12 DIAGNOSIS — R112 Nausea with vomiting, unspecified: Secondary | ICD-10-CM | POA: Diagnosis present

## 2013-03-12 DIAGNOSIS — Z9119 Patient's noncompliance with other medical treatment and regimen: Secondary | ICD-10-CM

## 2013-03-12 DIAGNOSIS — E1010 Type 1 diabetes mellitus with ketoacidosis without coma: Principal | ICD-10-CM | POA: Diagnosis present

## 2013-03-12 DIAGNOSIS — F17200 Nicotine dependence, unspecified, uncomplicated: Secondary | ICD-10-CM | POA: Diagnosis present

## 2013-03-12 DIAGNOSIS — E876 Hypokalemia: Secondary | ICD-10-CM | POA: Clinically undetermined

## 2013-03-12 DIAGNOSIS — D509 Iron deficiency anemia, unspecified: Secondary | ICD-10-CM | POA: Diagnosis present

## 2013-03-12 DIAGNOSIS — G40909 Epilepsy, unspecified, not intractable, without status epilepticus: Secondary | ICD-10-CM

## 2013-03-12 DIAGNOSIS — Z7982 Long term (current) use of aspirin: Secondary | ICD-10-CM

## 2013-03-12 DIAGNOSIS — E1110 Type 2 diabetes mellitus with ketoacidosis without coma: Secondary | ICD-10-CM | POA: Diagnosis present

## 2013-03-12 DIAGNOSIS — Z720 Tobacco use: Secondary | ICD-10-CM | POA: Diagnosis present

## 2013-03-12 DIAGNOSIS — K7460 Unspecified cirrhosis of liver: Secondary | ICD-10-CM | POA: Diagnosis present

## 2013-03-12 LAB — URINALYSIS, ROUTINE W REFLEX MICROSCOPIC
Bilirubin Urine: NEGATIVE
Glucose, UA: >1000 mg/dL — AB
Ketones, ur: >80 mg/dL — AB
Leukocytes, UA: NEGATIVE
Protein, ur: NEGATIVE mg/dL
Specific Gravity, Urine: 1.046 — ABNORMAL HIGH (ref 1.005–1.030)
pH: 5.5 (ref 5.0–8.0)

## 2013-03-12 LAB — COMPREHENSIVE METABOLIC PANEL
ALT: 45 U/L — ABNORMAL HIGH (ref 0–35)
AST: 51 U/L — ABNORMAL HIGH (ref 0–37)
Albumin: 4.1 g/dL (ref 3.5–5.2)
Alkaline Phosphatase: 81 U/L (ref 39–117)
BUN: 20 mg/dL (ref 6–23)
Calcium: 9.9 mg/dL (ref 8.4–10.5)
Chloride: 96 mEq/L (ref 96–112)
Glucose, Bld: 382 mg/dL — ABNORMAL HIGH (ref 70–99)
Potassium: 4 mEq/L (ref 3.5–5.1)
Sodium: 136 mEq/L (ref 135–145)
Total Bilirubin: 0.3 mg/dL (ref 0.3–1.2)
Total Protein: 8.2 g/dL (ref 6.0–8.3)

## 2013-03-12 LAB — CBC WITH DIFFERENTIAL/PLATELET
Eosinophils Relative: 0 % (ref 0–5)
HCT: 37.4 % (ref 36.0–46.0)
Lymphocytes Relative: 16 % (ref 12–46)
Lymphs Abs: 2 10*3/uL (ref 0.7–4.0)
MCV: 70.6 fL — ABNORMAL LOW (ref 78.0–100.0)
Monocytes Relative: 2 % — ABNORMAL LOW (ref 3–12)
Neutro Abs: 10.5 10*3/uL — ABNORMAL HIGH (ref 1.7–7.7)
Platelets: 172 10*3/uL (ref 150–400)
RBC: 5.3 MIL/uL — ABNORMAL HIGH (ref 3.87–5.11)
WBC: 12.8 10*3/uL — ABNORMAL HIGH (ref 4.0–10.5)

## 2013-03-12 LAB — GLUCOSE, CAPILLARY
Glucose-Capillary: 305 mg/dL — ABNORMAL HIGH (ref 70–99)
Glucose-Capillary: 340 mg/dL — ABNORMAL HIGH (ref 70–99)

## 2013-03-12 LAB — URINE MICROSCOPIC-ADD ON

## 2013-03-12 LAB — LIPASE, BLOOD: Lipase: 26 U/L (ref 11–59)

## 2013-03-12 MED ORDER — IOHEXOL 300 MG/ML  SOLN
100.0000 mL | Freq: Once | INTRAMUSCULAR | Status: AC | PRN
  Administered 2013-03-12: 100 mL via INTRAVENOUS

## 2013-03-12 MED ORDER — ONDANSETRON HCL 4 MG/2ML IJ SOLN
4.0000 mg | Freq: Once | INTRAMUSCULAR | Status: AC
  Administered 2013-03-12: 4 mg via INTRAVENOUS
  Filled 2013-03-12: qty 2

## 2013-03-12 MED ORDER — HYDROMORPHONE HCL PF 1 MG/ML IJ SOLN
1.0000 mg | Freq: Once | INTRAMUSCULAR | Status: AC
  Administered 2013-03-12: 1 mg via INTRAVENOUS
  Filled 2013-03-12: qty 1

## 2013-03-12 MED ORDER — MORPHINE SULFATE 4 MG/ML IJ SOLN
4.0000 mg | Freq: Once | INTRAMUSCULAR | Status: AC
  Administered 2013-03-12: 4 mg via INTRAVENOUS
  Filled 2013-03-12: qty 1

## 2013-03-12 MED ORDER — SODIUM CHLORIDE 0.9 % IV BOLUS (SEPSIS)
1000.0000 mL | Freq: Once | INTRAVENOUS | Status: AC
  Administered 2013-03-12: 1000 mL via INTRAVENOUS

## 2013-03-12 MED ORDER — SODIUM CHLORIDE 0.9 % IV BOLUS (SEPSIS)
1000.0000 mL | Freq: Once | INTRAVENOUS | Status: AC
  Administered 2013-03-13: 1000 mL via INTRAVENOUS

## 2013-03-12 MED ORDER — INSULIN GLARGINE 100 UNIT/ML ~~LOC~~ SOLN
8.0000 [IU] | Freq: Once | SUBCUTANEOUS | Status: AC
  Administered 2013-03-12: 8 [IU] via SUBCUTANEOUS
  Filled 2013-03-12: qty 0.08

## 2013-03-12 MED ORDER — IOHEXOL 300 MG/ML  SOLN
50.0000 mL | Freq: Once | INTRAMUSCULAR | Status: AC | PRN
  Administered 2013-03-12: 50 mL via ORAL

## 2013-03-12 NOTE — ED Notes (Signed)
PA Browning at bedside. 

## 2013-03-12 NOTE — ED Notes (Addendum)
Pt's friend states she vomited after drinking water. Pt lying on R side. No nausea or vomiting noted. Speech slurred. Pt's friend states "why don't y'all give her Dilaudid". Pt resting quietly on R side with legs hanging off the bed. Pt appears to be in no distress.

## 2013-03-12 NOTE — ED Notes (Signed)
Pt aware urine sample needed 

## 2013-03-12 NOTE — ED Provider Notes (Signed)
CSN: [id number]     Arrival date & time 03/12/13  1722 History   First MD Initiated Contact with Patient 03/12/13 1725     Chief Complaint  Patient presents with  . Emesis   (Consider location/radiation/quality/duration/timing/severity/associated sxs/prior Treatment) HPI Comments: Patient presents emergency department with chief complaint of severe abdominal pain that began this morning. It is associated with nausea and vomiting. She has not tried taking anything to alleviate her symptoms. She states that she has not taken her insulin today. She describes the abdominal pain as sharp and achy.  Abdominal surgical history includes C-section.  The history is provided by the patient. No language interpreter was used.    Past Medical History  Diagnosis Date  . Diabetes mellitus     diagnosed in 1996-always been on insulin  . Kidney stone   . Seizure disorder     started with pregnancy of first son  . Gastritis   . Bipolar 2 disorder   . Post traumatic stress disorder     "flipping out" after people close to her died  . DKA (diabetic ketoacidoses)     Recurrent admissions for DKA, medication non-complaince, poor social situation  . Heart murmur   . Arthritis     r ankle-S/P surgery 11/23/2005)  . Peripheral vascular disease     numbness bilaterally feet  . Anemia     childhood  . Depression     childhood  . Hepatitis C     biopsy in 2010-no rx-was supposed to see a hepatologist in [place].  With cirrhosis  . Neuropathy     in legs and feet and hands   Past Surgical History  Procedure Laterality Date  . Ankle surgery    . Cesarean section    . Endometrial biopsy  06/22/2012   Family History  Problem Relation Age of Onset  . Diabetes Mother     currently 67  . Fibromyalgia Mother   . Cirrhosis Father     died in 11/23/1997   History  Substance Use Topics  . Smoking status: Current Every Day Smoker -- 0.10 packs/day for 25 years    Types: Cigarettes  . Smokeless tobacco:  Never Used  . Alcohol Use: No     Comment: Endorses hasn't been drinking   OB History   Grav Para Term Preterm Abortions TAB SAB Ect Mult Living   4 3 3  0 1 0 1 0 1 2     Review of Systems  All other systems reviewed and are negative.    Allergies  Penicillins  Home Medications   Current Outpatient Rx  Name  Route  Sig  Dispense  Refill  . aspirin EC 81 MG tablet   Oral   Take 81 mg by mouth daily.          . Blood Glucose Monitoring Suppl (BLOOD GLUCOSE METER) kit      Use as instructed   1 each   0     Diabetes mellitus 250.00. Uncontrolled, Insulin de ...   . calcium carbonate (TUMS - DOSED IN MG ELEMENTAL CALCIUM) 500 MG chewable tablet   Oral   Chew 2 tablets by mouth daily as needed. For heartburn         . diphenhydrAMINE (BENADRYL) 25 MG tablet   Oral   Take 25 mg by mouth every 6 (six) hours as needed for itching.         Marland Kitchen doxycycline (VIBRAMYCIN) 100 MG capsule  Oral   Take 1 capsule (100 mg total) by mouth 2 (two) times daily. One po bid x 7 days   14 capsule   0   . gabapentin (NEURONTIN) 400 MG capsule   Oral   Take 400 mg by mouth 3 (three) times daily.         Marland Kitchen glipiZIDE (GLUCOTROL) 5 MG tablet   Oral   Take 5 mg by mouth 2 (two) times daily.         . Glucose Blood (BLOOD GLUCOSE TEST STRIPS) STRP      Use three times per day to check blood sugar   100 each   11   . hydrochlorothiazide (MICROZIDE) 12.5 MG capsule   Oral   Take 12.5 mg by mouth daily.         Marland Kitchen HYDROcodone-acetaminophen (NORCO/VICODIN) 5-325 MG per tablet   Oral   Take 1 tablet by mouth every 4 (four) hours as needed for pain.   15 tablet   0   . HYDROcodone-acetaminophen (NORCO/VICODIN) 5-325 MG per tablet   Oral   Take 1 tablet by mouth every 4 (four) hours as needed for pain.   15 tablet   0   . HYDROcodone-acetaminophen (NORCO/VICODIN) 5-325 MG per tablet      1 to 2 tabs every 4 to 6 hours as needed for pain.   20 tablet   0   .  insulin glargine (LANTUS) 100 UNIT/ML injection   Subcutaneous   Inject 50 Units into the skin at bedtime.         . Insulin Syringe-Needle U-100 (INSULIN SYRINGE .3CC/31GX5/16") 31G X 5/16" 0.3 ML MISC      Use to inject insulin daily   100 each   11   . Lancet Device MISC      Use to check blood sugar   100 each   1   . medroxyPROGESTERone (PROVERA) 10 MG tablet   Oral   Take 1 tablet (10 mg total) by mouth daily.   10 tablet   0   . meloxicam (MOBIC) 15 MG tablet   Oral   Take 15 mg by mouth daily.          . metFORMIN (GLUCOPHAGE) 1000 MG tablet   Oral   Take 1,000 mg by mouth 2 (two) times daily.         . metoCLOPramide (REGLAN) 10 MG tablet   Oral   Take 10 mg by mouth 3 (three) times daily before meals.         . metroNIDAZOLE (FLAGYL) 500 MG tablet   Oral   Take 1 tablet (500 mg total) by mouth 3 (three) times daily.   30 tablet   0   . Multiple Vitamin (MULTIVITAMIN WITH MINERALS) TABS tablet   Oral   Take 1 tablet by mouth daily.         . promethazine (PHENERGAN) 25 MG tablet   Oral   Take 1 tablet (25 mg total) by mouth every 6 (six) hours as needed for nausea.   20 tablet   0   . ranitidine (ZANTAC) 150 MG tablet   Oral   Take 150 mg by mouth 2 (two) times daily.         . risperidone (RISPERDAL) 4 MG tablet   Oral   Take 4 mg by mouth daily.         . sitaGLIPtin (JANUVIA) 100 MG tablet   Oral  Take 100 mg by mouth daily.          BP 138/65  Pulse 50  Temp(Src) 98.1 F (36.7 C) (Oral)  Resp 18  SpO2 98%  LMP 02/14/2013 Physical Exam  Nursing note and vitals reviewed. Constitutional: She is oriented to person, place, and time. She appears well-developed and well-nourished.  HENT:  Head: Normocephalic and atraumatic.  Eyes: Conjunctivae and EOM are normal. Pupils are equal, round, and reactive to light.  Neck: Normal range of motion. Neck supple.  Cardiovascular: Normal rate and regular rhythm.  Exam reveals no  gallop and no friction rub.   No murmur heard. Pulmonary/Chest: Effort normal and breath sounds normal. No respiratory distress. She has no wheezes. She has no rales. She exhibits no tenderness.  Abdominal: Soft. Bowel sounds are normal. She exhibits no distension and no mass. There is tenderness. There is guarding. There is no rebound.  Diffuse abdominal tenderness with guarding, no focal tenderness  Musculoskeletal: Normal range of motion. She exhibits no edema and no tenderness.  Neurological: She is alert and oriented to person, place, and time.  Skin: Skin is warm and dry.  Psychiatric: She has a normal mood and affect. Her behavior is normal. Judgment and thought content normal.    ED Course  Procedures (including critical care time)  Results for orders placed during the hospital encounter of 03/12/13  CBC WITH DIFFERENTIAL      Result Value Range   WBC 12.8 (*) 4.0 - 10.5 K/uL   RBC 5.30 (*) 3.87 - 5.11 MIL/uL   Hemoglobin 12.1  12.0 - 15.0 g/dL   HCT 16.1  09.6 - 04.5 %   MCV 70.6 (*) 78.0 - 100.0 fL   MCH 22.8 (*) 26.0 - 34.0 pg   MCHC 32.4  30.0 - 36.0 g/dL   RDW 40.9  81.1 - 91.4 %   Platelets 172  150 - 400 K/uL   Neutrophils Relative % 82 (*) 43 - 77 %   Lymphocytes Relative 16  12 - 46 %   Monocytes Relative 2 (*) 3 - 12 %   Eosinophils Relative 0  0 - 5 %   Basophils Relative 0  0 - 1 %   Neutro Abs 10.5 (*) 1.7 - 7.7 K/uL   Lymphs Abs 2.0  0.7 - 4.0 K/uL   Monocytes Absolute 0.3  0.1 - 1.0 K/uL   Eosinophils Absolute 0.0  0.0 - 0.7 K/uL   Basophils Absolute 0.0  0.0 - 0.1 K/uL   WBC Morphology MILD LEFT SHIFT (1-5% METAS, OCC MYELO, OCC BANDS)     Smear Review LARGE PLATELETS PRESENT    COMPREHENSIVE METABOLIC PANEL      Result Value Range   Sodium 136  135 - 145 mEq/L   Potassium 4.0  3.5 - 5.1 mEq/L   Chloride 96  96 - 112 mEq/L   CO2 21  19 - 32 mEq/L   Glucose, Bld 382 (*) 70 - 99 mg/dL   BUN 20  6 - 23 mg/dL   Creatinine, Ser 7.82  0.50 - 1.10 mg/dL    Calcium 9.9  8.4 - 95.6 mg/dL   Total Protein 8.2  6.0 - 8.3 g/dL   Albumin 4.1  3.5 - 5.2 g/dL   AST 51 (*) 0 - 37 U/L   ALT 45 (*) 0 - 35 U/L   Alkaline Phosphatase 81  39 - 117 U/L   Total Bilirubin 0.3  0.3 - 1.2 mg/dL  GFR calc non Af Amer >90  >90 mL/min   GFR calc Af Amer >90  >90 mL/min  LIPASE, BLOOD      Result Value Range   Lipase 26  11 - 59 U/L  URINALYSIS, ROUTINE W REFLEX MICROSCOPIC      Result Value Range   Color, Urine YELLOW  YELLOW   APPearance CLEAR  CLEAR   Specific Gravity, Urine 1.046 (*) 1.005 - 1.030   pH 5.5  5.0 - 8.0   Glucose, UA >1000 (*) NEGATIVE mg/dL   Hgb urine dipstick TRACE (*) NEGATIVE   Bilirubin Urine NEGATIVE  NEGATIVE   Ketones, ur >80 (*) NEGATIVE mg/dL   Protein, ur NEGATIVE  NEGATIVE mg/dL   Urobilinogen, UA 0.2  0.0 - 1.0 mg/dL   Nitrite NEGATIVE  NEGATIVE   Leukocytes, UA NEGATIVE  NEGATIVE  URINE MICROSCOPIC-ADD ON      Result Value Range   Squamous Epithelial / LPF RARE  RARE   RBC / HPF 0-2  <3 RBC/hpf  GLUCOSE, CAPILLARY      Result Value Range   Glucose-Capillary 305 (*) 70 - 99 mg/dL   Comment 1 Notify RN    GLUCOSE, CAPILLARY      Result Value Range   Glucose-Capillary 340 (*) 70 - 99 mg/dL  BLOOD GAS, VENOUS      Result Value Range   FIO2 0.21     pH, Ven 7.243 (*) 7.250 - 7.300   pCO2, Ven 44.2 (*) 45.0 - 50.0 mmHg   pO2, Ven 53.6 (*) 30.0 - 45.0 mmHg   Bicarbonate 18.4 (*) 20.0 - 24.0 mEq/L   TCO2 17.3  0 - 100 mmol/L   Acid-base deficit 8.3 (*) 0.0 - 2.0 mmol/L   O2 Saturation 81.4     Patient temperature 98.6     Drawn by COLLECTED BY NURSE     Sample type VENOUS    GLUCOSE, CAPILLARY      Result Value Range   Glucose-Capillary 308 (*) 70 - 99 mg/dL  POCT PREGNANCY, URINE      Result Value Range   Preg Test, Ur NEGATIVE  NEGATIVE   Ct Abdomen Pelvis W Contrast  03/12/2013   *RADIOLOGY REPORT*  Clinical Data: 42 year old female with abdominal pelvic pain, nausea and vomiting.  CT ABDOMEN AND PELVIS  WITH CONTRAST  Technique:  Multidetector CT imaging of the abdomen and pelvis was performed following the standard protocol during bolus administration of intravenous contrast.  Contrast: 100 ml intravenous Omnipaque-300  Comparison: 11/17/2012 and prior CTs dating back to 08/22/2006  Findings: Small hiatal hernia is present.  The liver, spleen, gallbladder, kidneys, pancreas and adrenal glands are unremarkable.  No free fluid, enlarged lymph nodes, biliary dilation or abdominal aortic aneurysm identified.  The bowel and bladder are unremarkable. There is no evidence of bowel obstruction or pneumoperitoneum.  No acute or suspicious bony abnormalities are identified. A moderate apex left thoracolumbar scoliosis is present.  IMPRESSION: No acute abnormalities.  Small hiatal hernia.   Original Report Authenticated By: Harmon Pier, M.D.        MDM   1. DKA (diabetic ketoacidoses)    Patient with severe abdominal pain and nausea and vomiting. Will check labs, will treat and nausea. Will give fluids, and will reevaluate.  CT is negative.  Patient is resting comfortably (observed from doorway), unless spoken to, then the patient complains of unrelenting pain and cries.  Concern for malingering.  Doubt acute process.  Sugar is high,  will give insulin and fluids.  Patient discussed with Dr. [name], says that once the sugar is controlled, then the patient can be discharged with watch a wait precautions.  Will also give pain and nausea meds.  Sugar has surprisingly gone up, will give more fluids.  Patient is extremely dramatic.  Her partner says that only dilaudid works for the pain.  1:20 AM Patient will be admitted to the hospital for DKA. Dr. Toniann Fail with [facility], will admit the patient.   Roxy Horseman, PA-C 03/13/13 0121

## 2013-03-12 NOTE — ED Notes (Signed)
Pt given water for fluid challenge 

## 2013-03-12 NOTE — ED Notes (Signed)
Bed: WA01 Expected date:  Expected time:  Means of arrival:  Comments: ems 

## 2013-03-12 NOTE — ED Notes (Signed)
Pt vomited CT contrast. PA notified.

## 2013-03-12 NOTE — ED Notes (Signed)
Pt BIB EMS. Pt c/o vomiting and abdominal pain since this morning. Pt reports that she didn't take her insulin today. Pt was given Zofran 4mg  IVP per EMS PTA. Pt from home.

## 2013-03-13 ENCOUNTER — Encounter (HOSPITAL_COMMUNITY): Payer: Self-pay | Admitting: Internal Medicine

## 2013-03-13 ENCOUNTER — Inpatient Hospital Stay (HOSPITAL_COMMUNITY): Payer: No Typology Code available for payment source

## 2013-03-13 DIAGNOSIS — E1110 Type 2 diabetes mellitus with ketoacidosis without coma: Secondary | ICD-10-CM

## 2013-03-13 DIAGNOSIS — R109 Unspecified abdominal pain: Secondary | ICD-10-CM

## 2013-03-13 DIAGNOSIS — F319 Bipolar disorder, unspecified: Secondary | ICD-10-CM

## 2013-03-13 DIAGNOSIS — G40909 Epilepsy, unspecified, not intractable, without status epilepticus: Secondary | ICD-10-CM

## 2013-03-13 LAB — BASIC METABOLIC PANEL
BUN: 17 mg/dL (ref 6–23)
BUN: 16 mg/dL (ref 6–23)
BUN: 14 mg/dL (ref 6–23)
CO2: 16 mEq/L — ABNORMAL LOW (ref 19–32)
CO2: 22 mEq/L (ref 19–32)
CO2: 21 mEq/L (ref 19–32)
Calcium: 8.8 mg/dL (ref 8.4–10.5)
Calcium: 8.6 mg/dL (ref 8.4–10.5)
Calcium: 8.1 mg/dL — ABNORMAL LOW (ref 8.4–10.5)
Chloride: 107 mEq/L (ref 96–112)
Chloride: 105 mEq/L (ref 96–112)
Chloride: 107 mEq/L (ref 96–112)
Chloride: 106 mEq/L (ref 96–112)
Creatinine, Ser: 0.53 mg/dL (ref 0.50–1.10)
Creatinine, Ser: 0.49 mg/dL — ABNORMAL LOW (ref 0.50–1.10)
Creatinine, Ser: 0.48 mg/dL — ABNORMAL LOW (ref 0.50–1.10)
GFR calc Af Amer: >90 mL/min (ref >90)
GFR calc Af Amer: >90 mL/min (ref >90)
GFR calc non Af Amer: >90 mL/min (ref >90)
GFR calc non Af Amer: >90 mL/min (ref >90)
GFR calc non Af Amer: >90 mL/min (ref >90)
Glucose, Bld: 168 mg/dL — ABNORMAL HIGH (ref 70–99)
Glucose, Bld: 122 mg/dL — ABNORMAL HIGH (ref 70–99)
Glucose, Bld: 172 mg/dL — ABNORMAL HIGH (ref 70–99)
Potassium: 4.2 mEq/L (ref 3.5–5.1)
Potassium: 4.2 mEq/L (ref 3.5–5.1)
Potassium: 3.4 mEq/L — ABNORMAL LOW (ref 3.5–5.1)
Potassium: 3.2 mEq/L — ABNORMAL LOW (ref 3.5–5.1)
Sodium: 141 mEq/L (ref 135–145)
Sodium: 141 mEq/L (ref 135–145)
Sodium: 138 mEq/L (ref 135–145)
Sodium: 136 mEq/L (ref 135–145)

## 2013-03-13 LAB — GLUCOSE, CAPILLARY
Glucose-Capillary: 150 mg/dL — ABNORMAL HIGH (ref 70–99)
Glucose-Capillary: 153 mg/dL — ABNORMAL HIGH (ref 70–99)
Glucose-Capillary: 154 mg/dL — ABNORMAL HIGH (ref 70–99)
Glucose-Capillary: 161 mg/dL — ABNORMAL HIGH (ref 70–99)
Glucose-Capillary: 162 mg/dL — ABNORMAL HIGH (ref 70–99)
Glucose-Capillary: 166 mg/dL — ABNORMAL HIGH (ref 70–99)
Glucose-Capillary: 166 mg/dL — ABNORMAL HIGH (ref 70–99)
Glucose-Capillary: 179 mg/dL — ABNORMAL HIGH (ref 70–99)
Glucose-Capillary: 196 mg/dL — ABNORMAL HIGH (ref 70–99)
Glucose-Capillary: 220 mg/dL — ABNORMAL HIGH (ref 70–99)
Glucose-Capillary: 250 mg/dL — ABNORMAL HIGH (ref 70–99)
Glucose-Capillary: 308 mg/dL — ABNORMAL HIGH (ref 70–99)

## 2013-03-13 LAB — LIPASE, BLOOD: Lipase: 16 U/L (ref 11–59)

## 2013-03-13 LAB — RAPID URINE DRUG SCREEN, HOSP PERFORMED
Barbiturates: NOT DETECTED
Cocaine: NOT DETECTED
Opiates: POSITIVE — AB
Tetrahydrocannabinol: POSITIVE — AB

## 2013-03-13 LAB — CBC
HCT: 37 % (ref 36.0–46.0)
MCHC: 31.6 g/dL (ref 30.0–36.0)
Platelets: 157 10*3/uL (ref 150–400)
RDW: 14.9 % (ref 11.5–15.5)
WBC: 15.3 10*3/uL — ABNORMAL HIGH (ref 4.0–10.5)

## 2013-03-13 LAB — DIFFERENTIAL
Basophils Relative: 0 % (ref 0–1)
Eosinophils Relative: 0 % (ref 0–5)
Lymphocytes Relative: 22 % (ref 12–46)
Lymphs Abs: 3.4 10*3/uL (ref 0.7–4.0)
Neutro Abs: 11.5 10*3/uL — ABNORMAL HIGH (ref 1.7–7.7)

## 2013-03-13 LAB — BLOOD GAS, VENOUS
Acid-base deficit: 8.3 mmol/L — ABNORMAL HIGH (ref 0.0–2.0)
Bicarbonate: 18.4 mEq/L — ABNORMAL LOW (ref 20.0–24.0)
FIO2: 0.21 %
O2 Saturation: 81.4 %
Patient temperature: 98.6
pH, Ven: 7.243 — ABNORMAL LOW (ref 7.250–7.300)
pO2, Ven: 53.6 mmHg — ABNORMAL HIGH (ref 30.0–45.0)

## 2013-03-13 LAB — HEPATIC FUNCTION PANEL
ALT: 47 U/L — ABNORMAL HIGH (ref 0–35)
Alkaline Phosphatase: 75 U/L (ref 39–117)
Bilirubin, Direct: <0.1 mg/dL (ref 0.0–0.3)
Total Protein: 7.7 g/dL (ref 6.0–8.3)

## 2013-03-13 LAB — HEMOGLOBIN A1C: Hgb A1c MFr Bld: 13.2 % — ABNORMAL HIGH (ref <5.7)

## 2013-03-13 LAB — TROPONIN I: Troponin I: <0.3 ng/mL (ref <0.30)

## 2013-03-13 MED ORDER — SODIUM CHLORIDE 0.9 % IV SOLN: 1000.0000 mL | Freq: Once | INTRAVENOUS | Status: DC

## 2013-03-13 MED ORDER — SODIUM CHLORIDE 0.9 % IV SOLN
1000.0000 mL | INTRAVENOUS | Status: DC
  Administered 2013-03-13: 1000 mL via INTRAVENOUS

## 2013-03-13 MED ORDER — POTASSIUM CHLORIDE 10 MEQ/100ML IV SOLN
10.0000 meq | INTRAVENOUS | Status: AC
  Administered 2013-03-13 (×3): 10 meq via INTRAVENOUS
  Filled 2013-03-13 (×3): qty 100

## 2013-03-13 MED ORDER — SODIUM CHLORIDE 0.9 % IV SOLN
INTRAVENOUS | Status: DC
  Administered 2013-03-13: 06:00:00 via INTRAVENOUS

## 2013-03-13 MED ORDER — SODIUM CHLORIDE 0.9 % IV SOLN
INTRAVENOUS | Status: DC
  Administered 2013-03-13: 1.2 [IU]/h via INTRAVENOUS
  Administered 2013-03-14: 06:00:00 1 [IU]/h via INTRAVENOUS
  Administered 2013-03-14: 2.2 [IU]/h via INTRAVENOUS
  Administered 2013-03-14: 10:00:00 1.8 [IU]/h via INTRAVENOUS
  Administered 2013-03-14: 0.9 [IU]/h via INTRAVENOUS
  Administered 2013-03-14: 02:00:00 2.3 [IU]/h via INTRAVENOUS
  Filled 2013-03-13 (×2): qty 1

## 2013-03-13 MED ORDER — DEXTROSE 50 % IV SOLN: 25.0000 mL | INTRAVENOUS | Status: DC | PRN

## 2013-03-13 MED ORDER — ONDANSETRON HCL 4 MG/2ML IJ SOLN
4.0000 mg | Freq: Four times a day (QID) | INTRAMUSCULAR | Status: DC | PRN
  Administered 2013-03-13 – 2013-03-15 (×3): 4 mg via INTRAVENOUS
  Filled 2013-03-13 (×4): qty 2

## 2013-03-13 MED ORDER — ENOXAPARIN SODIUM 40 MG/0.4ML ~~LOC~~ SOLN
40.0000 mg | SUBCUTANEOUS | Status: DC
  Administered 2013-03-13 – 2013-03-16 (×4): 40 mg via SUBCUTANEOUS
  Filled 2013-03-13 (×4): qty 0.4

## 2013-03-13 MED ORDER — SODIUM CHLORIDE 0.9 % IV BOLUS (SEPSIS)
1000.0000 mL | Freq: Once | INTRAVENOUS | Status: AC
  Administered 2013-03-13: 20:00:00 1000 mL via INTRAVENOUS

## 2013-03-13 MED ORDER — DEXTROSE-NACL 5-0.45 % IV SOLN
INTRAVENOUS | Status: DC
  Administered 2013-03-13 (×3): via INTRAVENOUS

## 2013-03-13 MED ORDER — SODIUM CHLORIDE 0.9 % IV SOLN
INTRAVENOUS | Status: DC
  Administered 2013-03-13: 2.4 [IU]/h via INTRAVENOUS
  Filled 2013-03-13: qty 1

## 2013-03-13 MED ORDER — HYDROMORPHONE HCL PF 1 MG/ML IJ SOLN
0.5000 mg | INTRAMUSCULAR | Status: DC | PRN
  Administered 2013-03-13 – 2013-03-14 (×6): 1 mg via INTRAVENOUS
  Filled 2013-03-13 (×6): qty 1

## 2013-03-13 NOTE — ED Notes (Signed)
Lab tech at bedside

## 2013-03-13 NOTE — ED Notes (Signed)
Iv team called and stated they would be down to attempt iv on pt.

## 2013-03-13 NOTE — Progress Notes (Addendum)
I seen and assessed patient and agree with Dr Katherene Ponto assessment and plan. Patient still c/o epigastric abdominal pain. Will get an acute abdominal series. Check a lipase level. Give a bolus of 1 L normal saline. Increase IV fluids to 150 cc per hour. Check BMET every 4 hours until anion gap is closed. Continue glucose stabilizer. Follow.

## 2013-03-13 NOTE — ED Notes (Signed)
Phelebotomy tech attempted venous blood gas x 2 with no success. Lab tech called for blood draw. She stated she has a few more blood draws and will come down ASAP.

## 2013-03-13 NOTE — H&P (Signed)
[facility] History and Physical  Mary [name] ZOX:[id number] DOB: September 21, 1970 DOA: 03/12/2013  Referring physician: ER physician. PCP: Dartha Lodge, FNP   Chief Complaint: Abdominal pain and nausea and vomiting.  HPI: Mary [name] is a 42 y.o. female with history of diabetes mellitus type 2 presents to the ER because of nausea and vomiting with abdominal pain. Patient states her symptoms started yesterday morning. Pain is diffuse across the abdomen radiating to the back with persistent nausea and vomiting. Denies any diarrhea. Denies any chest pain shortness of breath dizziness loss of consciousness or focal deficits. Patient in the ER was found to have diabetic ketoacidosis. CT abdomen and pelvis was unremarkable. Patient has been started on IV insulin and admitted for further management. Patient states that she has missed her medications last few days. Patient admitted in the last few months for DKA.  Review of Systems: As presented in the history of presenting illness, rest negative.  Past Medical History  Diagnosis Date  . Diabetes mellitus     diagnosed in 1996-always been on insulin  . Kidney stone   . Seizure disorder     started with pregnancy of first son  . Gastritis   . Bipolar 2 disorder   . Post traumatic stress disorder     "flipping out" after people close to her died  . DKA (diabetic ketoacidoses)     Recurrent admissions for DKA, medication non-complaince, poor social situation  . Heart murmur   . Arthritis     r ankle-S/P surgery 10/31/05)  . Peripheral vascular disease     numbness bilaterally feet  . Anemia     childhood  . Depression     childhood  . Hepatitis C     biopsy in 2010-no rx-was supposed to see a hepatologist in [place].  With cirrhosis  . Neuropathy     in legs and feet and hands   Past Surgical History  Procedure Laterality Date  . Ankle surgery    . Cesarean section    . Endometrial biopsy  06/22/2012   Social History:   reports that she has been smoking Cigarettes.  She has a 2.5 pack-year smoking history. She has never used smokeless tobacco. She reports that she uses illicit drugs (Marijuana) about once per week. She reports that she does not drink alcohol. Home. where does patient live-- Yes. Can patient participate in ADLs?  Allergies  Allergen Reactions  . Penicillins Rash    Family History  Problem Relation Age of Onset  . Diabetes Mother     currently 21  . Fibromyalgia Mother   . Cirrhosis Father     died in 1997-10-31      Prior to Admission medications   Medication Sig Start Date End Date Taking? Authorizing Provider  aspirin EC 81 MG tablet Take 81 mg by mouth daily.    Yes Historical Provider, MD  calcium carbonate (TUMS - DOSED IN MG ELEMENTAL CALCIUM) 500 MG chewable tablet Chew 2 tablets by mouth daily as needed. For heartburn   Yes Historical Provider, MD  diphenhydrAMINE (BENADRYL) 25 MG tablet Take 25 mg by mouth every 6 (six) hours as needed for itching.   Yes Historical Provider, MD  gabapentin (NEURONTIN) 400 MG capsule Take 400 mg by mouth 3 (three) times daily.   Yes Historical Provider, MD  glipiZIDE (GLUCOTROL) 5 MG tablet Take 5 mg by mouth 2 (two) times daily.   Yes Historical Provider, MD  hydrochlorothiazide (MICROZIDE) 12.5 MG capsule  Take 12.5 mg by mouth daily.   Yes Historical Provider, MD  HYDROcodone-acetaminophen (NORCO/VICODIN) 5-325 MG per tablet 1 to 2 tabs every 4 to 6 hours as needed for pain. 02/25/13  Yes Reuben Likes, MD  insulin glargine (LANTUS) 100 UNIT/ML injection Inject 50 Units into the skin at bedtime.   Yes Historical Provider, MD  medroxyPROGESTERone (PROVERA) 10 MG tablet Take 1 tablet (10 mg total) by mouth daily. 02/25/13  Yes Reuben Likes, MD  meloxicam (MOBIC) 15 MG tablet Take 15 mg by mouth daily.    Yes Historical Provider, MD  metFORMIN (GLUCOPHAGE) 1000 MG tablet Take 1,000 mg by mouth 2 (two) times daily.   Yes Historical Provider, MD   metoCLOPramide (REGLAN) 10 MG tablet Take 10 mg by mouth 3 (three) times daily before meals.   Yes Historical Provider, MD  promethazine (PHENERGAN) 25 MG tablet Take 1 tablet (25 mg total) by mouth every 6 (six) hours as needed for nausea. 01/09/13  Yes Linward Headland, MD  ranitidine (ZANTAC) 150 MG tablet Take 150 mg by mouth 2 (two) times daily.   Yes Historical Provider, MD  risperidone (RISPERDAL) 4 MG tablet Take 2 mg by mouth at bedtime.    Yes Historical Provider, MD  sitaGLIPtin (JANUVIA) 100 MG tablet Take 100 mg by mouth daily.   Yes Historical Provider, MD  Multiple Vitamin (MULTIVITAMIN WITH MINERALS) TABS tablet Take 1 tablet by mouth daily.    Historical Provider, MD   Physical Exam: Filed Vitals:   03/12/13 1902 03/13/13 0018 03/13/13 0128 03/13/13 0148  BP: 128/59 116/72 129/66   Pulse: 63 69 100   Temp:    97.8 F (36.6 C)  TempSrc:    Oral  Resp: 16 16 15    SpO2: 100% 96% 100%      General:  Well-developed and moderately nourished.  Eyes: Anicteric no pallor.  ENT: No discharge from ears eyes nose mouth.  Neck: No mass felt.  Cardiovascular: S1-S2 heard.  Respiratory: No rhonchi or crepitations.  Abdomen: Soft mild tenderness no guarding or rigidity.  Skin: No rash.  Musculoskeletal: No edema.  Psychiatric: Appears normal.  Neurologic: Alert awake oriented to time place and person. Moves all extremities.  Labs on Admission:  Basic Metabolic Panel:  Recent Labs Lab 03/12/13 1830  NA 136  K 4.0  CL 96  CO2 21  GLUCOSE 382*  BUN 20  CREATININE 0.51  CALCIUM 9.9   Liver Function Tests:  Recent Labs Lab 03/12/13 1830  AST 51*  ALT 45*  ALKPHOS 81  BILITOT 0.3  PROT 8.2  ALBUMIN 4.1    Recent Labs Lab 03/12/13 1830  LIPASE 26   No results found for this basename: AMMONIA,  in the last 168 hours CBC:  Recent Labs Lab 03/12/13 1830  WBC 12.8*  NEUTROABS 10.5*  HGB 12.1  HCT 37.4  MCV 70.6*  PLT 172   Cardiac  Enzymes: No results found for this basename: CKTOTAL, CKMB, CKMBINDEX, TROPONINI,  in the last 168 hours  BNP (last 3 results) No results found for this basename: PROBNP,  in the last 8760 hours CBG:  Recent Labs Lab 03/12/13 2149 03/12/13 2304 03/13/13 0052  GLUCAP 305* 340* 308*    Radiological Exams on Admission: Ct Abdomen Pelvis W Contrast  03/12/2013   *RADIOLOGY REPORT*  Clinical Data: 42 year old female with abdominal pelvic pain, nausea and vomiting.  CT ABDOMEN AND PELVIS WITH CONTRAST  Technique:  Multidetector CT imaging of the  abdomen and pelvis was performed following the standard protocol during bolus administration of intravenous contrast.  Contrast: 100 ml intravenous Omnipaque-300  Comparison: 11/17/2012 and prior CTs dating back to 08/22/2006  Findings: Small hiatal hernia is present.  The liver, spleen, gallbladder, kidneys, pancreas and adrenal glands are unremarkable.  No free fluid, enlarged lymph nodes, biliary dilation or abdominal aortic aneurysm identified.  The bowel and bladder are unremarkable. There is no evidence of bowel obstruction or pneumoperitoneum.  No acute or suspicious bony abnormalities are identified. A moderate apex left thoracolumbar scoliosis is present.  IMPRESSION: No acute abnormalities.  Small hiatal hernia.   Original Report Authenticated By: Harmon Pier, M.D.      Assessment/Plan Principal Problem:   DKA (diabetic ketoacidoses) Active Problems:   Tobacco abuse   Bipolar disorder   1. Diabetic ketoacidosis - probably precipitated by noncompliance with medications. Continue with IV insulin until anion gap gets corrected. Check hemoglobin A1c. Continue hydration. 2. Nausea vomiting abdominal pain - probably precipitated by #1 possible gastroparesis from diabetes. CT abdomen is unremarkable. 3. Tobacco abuse - advised to quit smoking. 4. History of cirrhosis - present LFTs are mildly elevated. Closely monitor fluid status. Follow  LFTs. 5. Bipolar disorder - continue home medications once patient can take oral.    Code Status: Full code.  Family Communication: None.  Disposition Plan: Admit to inpatient.    KAKRAKANDY,ARSHAD N. [facility] Pager [phone].  If 7PM-7AM, please contact night-coverage [email or web address] Password [facility] 03/13/2013, 1:59 AM

## 2013-03-13 NOTE — ED Provider Notes (Signed)
Medical screening examination/treatment/procedure(s) were performed by non-physician practitioner and as supervising physician I was immediately available for consultation/collaboration.  Renu Asby T Fredericka Bottcher, MD 03/13/13 1604 

## 2013-03-14 DIAGNOSIS — R112 Nausea with vomiting, unspecified: Secondary | ICD-10-CM | POA: Diagnosis present

## 2013-03-14 DIAGNOSIS — E876 Hypokalemia: Secondary | ICD-10-CM | POA: Clinically undetermined

## 2013-03-14 DIAGNOSIS — R109 Unspecified abdominal pain: Secondary | ICD-10-CM | POA: Diagnosis not present

## 2013-03-14 DIAGNOSIS — E1110 Type 2 diabetes mellitus with ketoacidosis without coma: Secondary | ICD-10-CM | POA: Diagnosis not present

## 2013-03-14 DIAGNOSIS — F319 Bipolar disorder, unspecified: Secondary | ICD-10-CM | POA: Diagnosis not present

## 2013-03-14 DIAGNOSIS — D72829 Elevated white blood cell count, unspecified: Secondary | ICD-10-CM | POA: Diagnosis present

## 2013-03-14 LAB — GLUCOSE, CAPILLARY
Glucose-Capillary: 117 mg/dL — ABNORMAL HIGH (ref 70–99)
Glucose-Capillary: 133 mg/dL — ABNORMAL HIGH (ref 70–99)
Glucose-Capillary: 148 mg/dL — ABNORMAL HIGH (ref 70–99)
Glucose-Capillary: 162 mg/dL — ABNORMAL HIGH (ref 70–99)
Glucose-Capillary: 168 mg/dL — ABNORMAL HIGH (ref 70–99)
Glucose-Capillary: 168 mg/dL — ABNORMAL HIGH (ref 70–99)
Glucose-Capillary: 187 mg/dL — ABNORMAL HIGH (ref 70–99)

## 2013-03-14 LAB — CBC WITH DIFFERENTIAL/PLATELET
Basophils Absolute: 0 10*3/uL (ref 0.0–0.1)
Basophils Relative: 0 % (ref 0–1)
Eosinophils Relative: 1 % (ref 0–5)
HCT: 31.5 % — ABNORMAL LOW (ref 36.0–46.0)
MCHC: 31.1 g/dL (ref 30.0–36.0)
MCV: 71.6 fL — ABNORMAL LOW (ref 78.0–100.0)
Monocytes Absolute: 1 10*3/uL (ref 0.1–1.0)
Neutro Abs: 10.5 10*3/uL — ABNORMAL HIGH (ref 1.7–7.7)
RDW: 15.1 % (ref 11.5–15.5)

## 2013-03-14 LAB — BASIC METABOLIC PANEL
BUN: 12 mg/dL (ref 6–23)
CO2: 20 mEq/L (ref 19–32)
CO2: 19 mEq/L (ref 19–32)
CO2: 19 mEq/L (ref 19–32)
Calcium: 7.9 mg/dL — ABNORMAL LOW (ref 8.4–10.5)
Calcium: 8.2 mg/dL — ABNORMAL LOW (ref 8.4–10.5)
Chloride: 108 mEq/L (ref 96–112)
Chloride: 106 mEq/L (ref 96–112)
Chloride: 105 mEq/L (ref 96–112)
Creatinine, Ser: 0.46 mg/dL — ABNORMAL LOW (ref 0.50–1.10)
Creatinine, Ser: 0.47 mg/dL — ABNORMAL LOW (ref 0.50–1.10)
Creatinine, Ser: 0.44 mg/dL — ABNORMAL LOW (ref 0.50–1.10)
GFR calc Af Amer: >90 mL/min (ref >90)
GFR calc Af Amer: >90 mL/min (ref >90)
GFR calc Af Amer: >90 mL/min (ref >90)
GFR calc non Af Amer: >90 mL/min (ref >90)
Glucose, Bld: 202 mg/dL — ABNORMAL HIGH (ref 70–99)
Glucose, Bld: 148 mg/dL — ABNORMAL HIGH (ref 70–99)
Potassium: 3.2 mEq/L — ABNORMAL LOW (ref 3.5–5.1)
Potassium: 3.2 mEq/L — ABNORMAL LOW (ref 3.5–5.1)
Potassium: 3.2 mEq/L — ABNORMAL LOW (ref 3.5–5.1)
Sodium: 136 mEq/L (ref 135–145)
Sodium: 134 mEq/L — ABNORMAL LOW (ref 135–145)
Sodium: 133 mEq/L — ABNORMAL LOW (ref 135–145)

## 2013-03-14 MED ORDER — PANTOPRAZOLE SODIUM 40 MG PO TBEC
40.0000 mg | DELAYED_RELEASE_TABLET | Freq: Every day | ORAL | Status: DC
  Administered 2013-03-14 – 2013-03-16 (×3): 40 mg via ORAL
  Filled 2013-03-14 (×5): qty 1

## 2013-03-14 MED ORDER — METOCLOPRAMIDE HCL 10 MG PO TABS
10.0000 mg | ORAL_TABLET | Freq: Three times a day (TID) | ORAL | Status: DC
  Administered 2013-03-14 – 2013-03-16 (×4): 10 mg via ORAL
  Filled 2013-03-14 (×8): qty 1

## 2013-03-14 MED ORDER — INSULIN GLARGINE 100 UNIT/ML ~~LOC~~ SOLN
40.0000 [IU] | SUBCUTANEOUS | Status: DC
  Administered 2013-03-14 – 2013-03-15 (×2): 40 [IU] via SUBCUTANEOUS
  Filled 2013-03-14 (×3): qty 0.4

## 2013-03-14 MED ORDER — GUAIFENESIN ER 600 MG PO TB12
1200.0000 mg | ORAL_TABLET | Freq: Two times a day (BID) | ORAL | Status: DC
  Administered 2013-03-14 – 2013-03-16 (×5): 1200 mg via ORAL
  Filled 2013-03-14 (×6): qty 2

## 2013-03-14 MED ORDER — INSULIN ASPART 100 UNIT/ML ~~LOC~~ SOLN
0.0000 [IU] | Freq: Three times a day (TID) | SUBCUTANEOUS | Status: DC
  Administered 2013-03-14: 17:00:00 3 [IU] via SUBCUTANEOUS
  Administered 2013-03-15: 13:00:00 2 [IU] via SUBCUTANEOUS
  Administered 2013-03-16: 3 [IU] via SUBCUTANEOUS

## 2013-03-14 MED ORDER — GABAPENTIN 400 MG PO CAPS
400.0000 mg | ORAL_CAPSULE | Freq: Three times a day (TID) | ORAL | Status: DC
  Administered 2013-03-14 – 2013-03-16 (×5): 400 mg via ORAL
  Filled 2013-03-14 (×8): qty 1

## 2013-03-14 MED ORDER — POTASSIUM CHLORIDE CRYS ER 20 MEQ PO TBCR
40.0000 meq | EXTENDED_RELEASE_TABLET | ORAL | Status: AC
  Administered 2013-03-14 (×2): 40 meq via ORAL
  Filled 2013-03-14 (×2): qty 2

## 2013-03-14 MED ORDER — MAGNESIUM SULFATE 50 % IJ SOLN
3.0000 g | Freq: Once | INTRAVENOUS | Status: AC
  Administered 2013-03-14: 3 g via INTRAVENOUS
  Filled 2013-03-14: qty 6

## 2013-03-14 MED ORDER — MEDROXYPROGESTERONE ACETATE 10 MG PO TABS
10.0000 mg | ORAL_TABLET | Freq: Every day | ORAL | Status: DC
Start: 2013-03-14 — End: 2013-03-16
  Administered 2013-03-14 – 2013-03-16 (×3): 10 mg via ORAL
  Filled 2013-03-14 (×3): qty 1

## 2013-03-14 MED ORDER — SODIUM CHLORIDE 0.9 % IV SOLN
INTRAVENOUS | Status: DC
  Administered 2013-03-15: 02:00:00 via INTRAVENOUS

## 2013-03-14 MED ORDER — RISPERIDONE 2 MG PO TABS
2.0000 mg | ORAL_TABLET | Freq: Every day | ORAL | Status: DC
  Administered 2013-03-14 – 2013-03-15 (×2): 2 mg via ORAL
  Filled 2013-03-14 (×3): qty 1

## 2013-03-14 NOTE — Progress Notes (Signed)
[facility] PROGRESS NOTE  Mary [name] ZOX:[id number] DOB: [birth year] DOA: 03/12/2013 PCP: Dartha Lodge, FNP  Assessment/Plan: #1 DKA Likely secondary to medical noncompliance. Patient stated she ran out of her medications. CT of the abdomen and pelvis is negative. Acute abdominal series is negative. Urinalysis is negative. No signs or symptoms of infection. Anion gap is closed. Will place patient on Lantus 40 units daily and overlap with the insulin drip. Discontinue insulin drip and 2 hours. Hemoglobin A1c is 13.2. This continue every 4 hour by meds. Continue IV fluids and supportive care. Start clear liquids.  #2 nausea/abdominal pain Secondary to problem #1.   #3 leukocytosis Likely a reactive leukocytosis secondary to problem #1. Chest x-ray is negative for any acute infiltrate. Patient is afebrile. Urinalysis is negative. Follow.  #4 microcytic anemia Likely iron deficiency anemia in a menstruating female. Check an anemia panel. No overt GI bleed. Follow H&H.  #5 history of cirrhosis Stable.  #6 bipolar disorder Will resume home regimen.  #7 hypokalemia Replete.  #8 tobacco abuse Tobacco cessation.  #9 prophylaxis PPI for GI prophylaxis. Lovenox for DVT prophylaxis.   Code Status: Full Family Communication: Updated patient at bedside. Disposition Plan: Home when medically stable.   Consultants:  None  Procedures:  Acute abdominal series 03/13/2013  CT of the abdomen and pelvis 03/12/2013  Antibiotics:  None  HPI/Subjective: Patient states epigastric abdominal pain improving. No emesis.  Objective: Filed Vitals:   03/14/13 0645  BP: 119/65  Pulse: 73  Temp: 98.6 F (37 C)  Resp: 16    Intake/Output Summary (Last 24 hours) at 03/14/13 1207 Last data filed at 03/14/13 1159  Gross per 24 hour  Intake 3192.08 ml  Output    750 ml  Net 2442.08 ml   Filed Weights   03/13/13 0348  Weight: 55.929 kg (123 lb 4.8 oz)     Exam:   General:  NAD  Cardiovascular: RRR  Respiratory: CTAB  Abdomen: Soft/ decr TTP in epigastrium/+BS/ND  Musculoskeletal: No c/c/e  Data Reviewed: Basic Metabolic Panel:  Recent Labs Lab 03/13/13 1937 03/13/13 2317 03/14/13 0330 03/14/13 0706 03/14/13 1107  NA 138 136 136 134* 133*  K 3.4* 3.2* 3.2* 3.2* 3.2*  CL 107 106 108 106 105  CO2 22 21 20 19 19   GLUCOSE 122* 172* 145* 202* 148*  BUN 16 14 12 11 9   CREATININE 0.47* 0.42* 0.46* 0.47* 0.44*  CALCIUM 8.5 8.1* 8.2* 7.9* 8.2*  MG  --   --  1.8  --   --    Liver Function Tests:  Recent Labs Lab 03/12/13 1830 03/13/13 0410  AST 51* 54*  ALT 45* 47*  ALKPHOS 81 75  BILITOT 0.3 0.2*  PROT 8.2 7.7  ALBUMIN 4.1 3.7    Recent Labs Lab 03/12/13 1830 03/13/13 1937  LIPASE 26 16   No results found for this basename: AMMONIA,  in the last 168 hours CBC:  Recent Labs Lab 03/12/13 1830 03/13/13 0410 03/14/13 0330  WBC 12.8* 15.3* 17.0*  NEUTROABS 10.5* 11.5* 10.5*  HGB 12.1 11.7* 9.8*  HCT 37.4 37.0 31.5*  MCV 70.6* 71.6* 71.6*  PLT 172 157 149*   Cardiac Enzymes:  Recent Labs Lab 03/13/13 0410  TROPONINI <0.30   BNP (last 3 results) No results found for this basename: PROBNP,  in the last 8760 hours CBG:  Recent Labs Lab 03/14/13 0740 03/14/13 0840 03/14/13 0943 03/14/13 1050 03/14/13 1147  GLUCAP 160* 154* 150* 168* 121*  Recent Results (from the past 240 hour(s))  MRSA PCR SCREENING     Status: None   Collection Time    03/13/13  5:22 AM      Result Value Range Status   MRSA by PCR NEGATIVE  NEGATIVE Final   Comment:            The GeneXpert MRSA Assay (FDA     approved for NASAL specimens     only), is one component of a     comprehensive MRSA colonization     surveillance program. It is not     intended to diagnose MRSA     infection nor to guide or     monitor treatment for     MRSA infections.     Studies: Ct Abdomen Pelvis W Contrast  03/12/2013    *RADIOLOGY REPORT*  Clinical Data: 42 year old female with abdominal pelvic pain, nausea and vomiting.  CT ABDOMEN AND PELVIS WITH CONTRAST  Technique:  Multidetector CT imaging of the abdomen and pelvis was performed following the standard protocol during bolus administration of intravenous contrast.  Contrast: 100 ml intravenous Omnipaque-300  Comparison: 11/17/2012 and prior CTs dating back to 08/22/2006  Findings: Small hiatal hernia is present.  The liver, spleen, gallbladder, kidneys, pancreas and adrenal glands are unremarkable.  No free fluid, enlarged lymph nodes, biliary dilation or abdominal aortic aneurysm identified.  The bowel and bladder are unremarkable. There is no evidence of bowel obstruction or pneumoperitoneum.  No acute or suspicious bony abnormalities are identified. A moderate apex left thoracolumbar scoliosis is present.  IMPRESSION: No acute abnormalities.  Small hiatal hernia.   Original Report Authenticated By: Harmon Pier, M.D.   Dg Abd Acute W/chest  03/14/2013   CLINICAL DATA:  Nausea and vomiting. History of gastritis.  EXAM: ACUTE ABDOMEN SERIES (ABDOMEN 2 VIEW & CHEST 1 VIEW)  COMPARISON:  01/07/2013  FINDINGS: Frontal view of the chest demonstrates S-shaped spinal curvature. Midline trachea. Normal heart size and mediastinal contours. No pleural effusion or pneumothorax. Clear lungs.  Abdominal films demonstrate no free intraperitoneal air on upright positioning. No significant air fluid levels. No bowel distention on supine imaging. Phleboliths in the pelvis. No abnormal abdominal calcifications. No appendicolith. Low pelvis minimally excluded.  IMPRESSION: No acute findings.   Electronically Signed   By: Jeronimo Greaves   On: 03/14/2013 00:21    Scheduled Meds: . enoxaparin (LOVENOX) injection  40 mg Subcutaneous Q24H  . insulin glargine  40 Units Subcutaneous Q24H  . potassium chloride  40 mEq Oral Q4H   Continuous Infusions: . sodium chloride 125 mL/hr at 03/13/13 2300   . dextrose 5 % and 0.45% NaCl 150 mL/hr at 03/14/13 0626  . insulin (NOVOLIN-R) infusion 0.6 Units/hr (03/14/13 1151)    Principal Problem:   DKA (diabetic ketoacidoses) Active Problems:   Bipolar 2 disorder   Cirrhosis of liver   Microcytic anemia   Tobacco abuse   Bipolar disorder   DM (diabetes mellitus), type 1, uncontrolled   Hypokalemia   Leukocytosis, unspecified   Nausea with vomiting    Time spent: > 35 mins    [facility]  [facility] Pager [phone]. If 7PM-7AM, please contact night-coverage at [email or web address], password [facility] 03/14/2013, 12:07 PM  LOS: 2 days

## 2013-03-15 DIAGNOSIS — F319 Bipolar disorder, unspecified: Secondary | ICD-10-CM | POA: Diagnosis not present

## 2013-03-15 DIAGNOSIS — R109 Unspecified abdominal pain: Secondary | ICD-10-CM | POA: Diagnosis not present

## 2013-03-15 DIAGNOSIS — D509 Iron deficiency anemia, unspecified: Secondary | ICD-10-CM | POA: Diagnosis not present

## 2013-03-15 DIAGNOSIS — E1110 Type 2 diabetes mellitus with ketoacidosis without coma: Secondary | ICD-10-CM | POA: Diagnosis not present

## 2013-03-15 LAB — CBC WITH DIFFERENTIAL/PLATELET
Eosinophils Absolute: 0.2 10*3/uL (ref 0.0–0.7)
Eosinophils Relative: 2 % (ref 0–5)
HCT: 34.3 % — ABNORMAL LOW (ref 36.0–46.0)
Hemoglobin: 10.9 g/dL — ABNORMAL LOW (ref 12.0–15.0)
Lymphs Abs: 5.6 10*3/uL — ABNORMAL HIGH (ref 0.7–4.0)
MCH: 22.6 pg — ABNORMAL LOW (ref 26.0–34.0)
MCV: 71.2 fL — ABNORMAL LOW (ref 78.0–100.0)
Monocytes Absolute: 0.7 10*3/uL (ref 0.1–1.0)
Monocytes Relative: 6 % (ref 3–12)
Neutrophils Relative %: 43 % (ref 43–77)
Platelets: 150 10*3/uL (ref 150–400)
RBC: 4.82 MIL/uL (ref 3.87–5.11)

## 2013-03-15 LAB — GLUCOSE, CAPILLARY
Glucose-Capillary: 108 mg/dL — ABNORMAL HIGH (ref 70–99)
Glucose-Capillary: 131 mg/dL — ABNORMAL HIGH (ref 70–99)
Glucose-Capillary: 98 mg/dL (ref 70–99)
Glucose-Capillary: 76 mg/dL (ref 70–99)

## 2013-03-15 LAB — COMPREHENSIVE METABOLIC PANEL
ALT: 41 U/L — ABNORMAL HIGH (ref 0–35)
AST: 41 U/L — ABNORMAL HIGH (ref 0–37)
BUN: 6 mg/dL (ref 6–23)
CO2: 21 mEq/L (ref 19–32)
Calcium: 8.5 mg/dL (ref 8.4–10.5)
Creatinine, Ser: 0.48 mg/dL — ABNORMAL LOW (ref 0.50–1.10)
GFR calc Af Amer: >90 mL/min (ref >90)
GFR calc non Af Amer: >90 mL/min (ref >90)
Glucose, Bld: 97 mg/dL (ref 70–99)
Sodium: 137 mEq/L (ref 135–145)
Total Protein: 6.1 g/dL (ref 6.0–8.3)

## 2013-03-15 LAB — BASIC METABOLIC PANEL
BUN: 5 mg/dL — ABNORMAL LOW (ref 6–23)
CO2: 20 mEq/L (ref 19–32)
Chloride: 110 mEq/L (ref 96–112)
Creatinine, Ser: 0.49 mg/dL — ABNORMAL LOW (ref 0.50–1.10)
GFR calc Af Amer: >90 mL/min (ref >90)
Glucose, Bld: 81 mg/dL (ref 70–99)
Potassium: 3.6 mEq/L (ref 3.5–5.1)
Sodium: 139 mEq/L (ref 135–145)

## 2013-03-15 LAB — LIPASE, BLOOD: Lipase: 15 U/L (ref 11–59)

## 2013-03-15 MED ORDER — SODIUM CHLORIDE 0.9 % IV SOLN
INTRAVENOUS | Status: DC
  Administered 2013-03-15 – 2013-03-16 (×3): via INTRAVENOUS

## 2013-03-15 MED ORDER — POTASSIUM CHLORIDE CRYS ER 20 MEQ PO TBCR
40.0000 meq | EXTENDED_RELEASE_TABLET | Freq: Once | ORAL | Status: AC
  Administered 2013-03-15: 40 meq via ORAL
  Filled 2013-03-15 (×2): qty 2

## 2013-03-15 MED ORDER — POTASSIUM CHLORIDE CRYS ER 20 MEQ PO TBCR
40.0000 meq | EXTENDED_RELEASE_TABLET | Freq: Once | ORAL | Status: AC
  Administered 2013-03-15: 11:00:00 40 meq via ORAL
  Filled 2013-03-15: qty 2

## 2013-03-15 MED ORDER — ASPIRIN EC 81 MG PO TBEC
81.0000 mg | DELAYED_RELEASE_TABLET | Freq: Every day | ORAL | Status: DC
  Administered 2013-03-15 – 2013-03-16 (×2): 81 mg via ORAL
  Filled 2013-03-15 (×2): qty 1

## 2013-03-15 MED ORDER — SODIUM CHLORIDE 0.9 % IV BOLUS (SEPSIS)
1000.0000 mL | Freq: Once | INTRAVENOUS | Status: AC
  Administered 2013-03-15: 18:00:00 1000 mL via INTRAVENOUS

## 2013-03-15 MED ORDER — HYDROCODONE-ACETAMINOPHEN 5-325 MG PO TABS
1.0000 | ORAL_TABLET | ORAL | Status: DC | PRN
  Administered 2013-03-15: 2 via ORAL
  Filled 2013-03-15: qty 2

## 2013-03-15 MED ORDER — MORPHINE SULFATE 2 MG/ML IJ SOLN
2.0000 mg | INTRAMUSCULAR | Status: DC | PRN
  Administered 2013-03-15 (×3): 2 mg via INTRAVENOUS
  Filled 2013-03-15 (×3): qty 1

## 2013-03-15 NOTE — Progress Notes (Signed)
Pt crying out in pain, states "my stomach hurts really bad". Dr. Janee Morn paged and informed that since eating breakfast at 9:00 pt has become sick. He verbalized to make pt NPO, and give morphine. See orders.

## 2013-03-15 NOTE — Progress Notes (Addendum)
Inpatient Diabetes Program Recommendations  AACE/ADA: New Consensus Statement on Inpatient Glycemic Control (2013)  Target Ranges:  Prepandial:   less than 140 mg/dL      Peak postprandial:   less than 180 mg/dL (1-2 hours)      Critically ill patients:  140 - 180 mg/dL   Reason for Visit: MD Consult due to poorly controlled diabetes  Note: Patient has been admitted x 6 during last six months.  Attempted to talk with patient.  Upon entering room, room dark-  Patient on side in bed with covers pulled up.  Says that she has been vomiting and has abdominal pain-- can't have any more meds right now and trying to be still to deal with it.  Doesn't feel like talking.  Informed patient that MD had asked that I speak with her, so will come back later.  Patient agreeable.  Well known from multiple previous admissions.  In the past when patient has run out of meds, was discharged on generic 70/30 from [facility].  Need to talk with her to assess source for insulin and explore why she ran out.  Will try later.  Thank you.  Yona Kosek S. Elsie Lincoln, RN, CNS, CDE Inpatient Diabetes Program, team pager [phone]   Addendum:  Correction.  Has had ED visits x 6 and hospital admissions x 3 in the last 6 months.

## 2013-03-15 NOTE — Progress Notes (Signed)
Returned to assess patient.  Patient lying on side with arms crossed across chest and eyes closed.  Patient states "they just gave me something".  Added that she has not been feeling too good.  Before I left room, I asked about her running out of insulin prior to admission.  She states that she has insulin waiting for her to pick up at [facility], but she has been too busy to get it.  (Went by "one day" but "the lady" wasn't there.  After that was too busy working and had too much to do after getting off work to go by and pick it up.)  Apparently patient has poor insight into diabetes and poor choices contributing to lack of insulin and subsequent repeated hospitalizations.  Question if there are secondary gains for patient when she is admitted to the hospital that may also contribute to repeat admissions.    Thank you.  Letrice Pollok S. Elsie Lincoln, RN, CNS, CDE Inpatient Diabetes Program, team pager [phone]

## 2013-03-15 NOTE — Progress Notes (Signed)
[facility] PROGRESS NOTE  SHARAN MCENANEY ZOX:[id number] DOB: 1971/02/05 DOA: 03/12/2013 PCP: Dartha Lodge, FNP  Assessment/Plan: #1 DKA Likely secondary to medical noncompliance. Patient stated she ran out of her medications. CT of the abdomen and pelvis is negative. Acute abdominal series is negative. Urinalysis is negative. No signs or symptoms of infection. Anion gap is closed. Continue Lantus 40 units daily. Hemoglobin A1c is 13.2. Patient c/o epigastric abdominal pain. Repeat CMET,lipase. Make NPO.  Continue IV fluids and supportive care. See if patient back in DKA.  #2 nausea/abdominal pain Secondary to problem #1. Repeat CMET to see if patient back in DKA.  #3 leukocytosis Likely a reactive leukocytosis secondary to problem #1. Chest x-ray is negative for any acute infiltrate. Patient is afebrile. Urinalysis is negative. Follow.  #4 microcytic anemia Likely iron deficiency anemia in a menstruating female. H/H stable. Check an anemia panel. No overt GI bleed. Follow H&H.  #5 history of cirrhosis Stable.  #6 bipolar disorder Stable.  #7 hypokalemia Replete.  #8 tobacco abuse Tobacco cessation.  #9 prophylaxis PPI for GI prophylaxis. Lovenox for DVT prophylaxis.   Code Status: Full Family Communication: Updated patient at bedside. Disposition Plan: Home when medically stable.   Consultants:  None  Procedures:  Acute abdominal series 03/13/2013  CT of the abdomen and pelvis 03/12/2013  Antibiotics:  None  HPI/Subjective: Patient c/o epigastric abdominal pain worse this morning.  Objective: Filed Vitals:   03/15/13 1351  BP: 145/82  Pulse: 97  Temp: 97.2 F (36.2 C)  Resp: 18    Intake/Output Summary (Last 24 hours) at 03/15/13 1640 Last data filed at 03/15/13 1400  Gross per 24 hour  Intake   1200 ml  Output   4800 ml  Net  -3600 ml   Filed Weights   03/13/13 0348  Weight: 55.929 kg (123 lb 4.8 oz)    Exam:   General:   NAD  Cardiovascular: RRR  Respiratory: CTAB  Abdomen: Soft/ TTP in epigastrium/+BS/ND  Musculoskeletal: No c/c/e  Data Reviewed: Basic Metabolic Panel:  Recent Labs Lab 03/13/13 2317 03/14/13 0330 03/14/13 0706 03/14/13 1107 03/15/13 0440  NA 136 136 134* 133* 139  K 3.2* 3.2* 3.2* 3.2* 3.6  CL 106 108 106 105 110  CO2 21 20 19 19 20   GLUCOSE 172* 145* 202* 148* 81  BUN 14 12 11 9  5*  CREATININE 0.42* 0.46* 0.47* 0.44* 0.49*  CALCIUM 8.1* 8.2* 7.9* 8.2* 8.6  MG  --  1.8  --   --  1.9   Liver Function Tests:  Recent Labs Lab 03/12/13 1830 03/13/13 0410  AST 51* 54*  ALT 45* 47*  ALKPHOS 81 75  BILITOT 0.3 0.2*  PROT 8.2 7.7  ALBUMIN 4.1 3.7    Recent Labs Lab 03/12/13 1830 03/13/13 1937  LIPASE 26 16   No results found for this basename: AMMONIA,  in the last 168 hours CBC:  Recent Labs Lab 03/12/13 1830 03/13/13 0410 03/14/13 0330 03/15/13 0440  WBC 12.8* 15.3* 17.0* 11.3*  NEUTROABS 10.5* 11.5* 10.5* 4.9  HGB 12.1 11.7* 9.8* 10.9*  HCT 37.4 37.0 31.5* 34.3*  MCV 70.6* 71.6* 71.6* 71.2*  PLT 172 157 149* 150   Cardiac Enzymes:  Recent Labs Lab 03/13/13 0410  TROPONINI <0.30   BNP (last 3 results) No results found for this basename: PROBNP,  in the last 8760 hours CBG:  Recent Labs Lab 03/14/13 1247 03/14/13 1344 03/14/13 1651 03/14/13 2102 03/15/13 0728  GLUCAP 115* 117* 186*  123* 108*    Recent Results (from the past 240 hour(s))  MRSA PCR SCREENING     Status: None   Collection Time    03/13/13  5:22 AM      Result Value Range Status   MRSA by PCR NEGATIVE  NEGATIVE Final   Comment:            The GeneXpert MRSA Assay (FDA     approved for NASAL specimens     only), is one component of a     comprehensive MRSA colonization     surveillance program. It is not     intended to diagnose MRSA     infection nor to guide or     monitor treatment for     MRSA infections.     Studies: Dg Abd Acute W/chest  03/14/2013    CLINICAL DATA:  Nausea and vomiting. History of gastritis.  EXAM: ACUTE ABDOMEN SERIES (ABDOMEN 2 VIEW & CHEST 1 VIEW)  COMPARISON:  01/07/2013  FINDINGS: Frontal view of the chest demonstrates S-shaped spinal curvature. Midline trachea. Normal heart size and mediastinal contours. No pleural effusion or pneumothorax. Clear lungs.  Abdominal films demonstrate no free intraperitoneal air on upright positioning. No significant air fluid levels. No bowel distention on supine imaging. Phleboliths in the pelvis. No abnormal abdominal calcifications. No appendicolith. Low pelvis minimally excluded.  IMPRESSION: No acute findings.   Electronically Signed   By: Jeronimo Greaves   On: 03/14/2013 00:21    Scheduled Meds: . aspirin EC  81 mg Oral Daily  . enoxaparin (LOVENOX) injection  40 mg Subcutaneous Q24H  . gabapentin  400 mg Oral TID  . guaiFENesin  1,200 mg Oral BID  . insulin aspart  0-15 Units Subcutaneous TID WC  . insulin glargine  40 Units Subcutaneous Q24H  . medroxyPROGESTERone  10 mg Oral Daily  . metoCLOPramide  10 mg Oral TID AC  . pantoprazole  40 mg Oral Q0600  . risperidone  2 mg Oral QHS   Continuous Infusions: . sodium chloride      Principal Problem:   DKA (diabetic ketoacidoses) Active Problems:   Bipolar 2 disorder   Cirrhosis of liver   Microcytic anemia   Tobacco abuse   Bipolar disorder   DM (diabetes mellitus), type 1, uncontrolled   Hypokalemia   Leukocytosis, unspecified   Nausea with vomiting    Time spent: > 35 mins    [facility]  [facility] Pager [phone]. If 7PM-7AM, please contact night-coverage at [email or web address], password [facility] 03/15/2013, 4:40 PM  LOS: 3 days

## 2013-03-15 NOTE — Progress Notes (Signed)
Pt states she feels better, dose of morphine and zofran just given per her request. She is now yelling at someone on phone. Encouraged to calm down.

## 2013-03-16 DIAGNOSIS — K7460 Unspecified cirrhosis of liver: Secondary | ICD-10-CM

## 2013-03-16 DIAGNOSIS — E1065 Type 1 diabetes mellitus with hyperglycemia: Secondary | ICD-10-CM

## 2013-03-16 DIAGNOSIS — F319 Bipolar disorder, unspecified: Secondary | ICD-10-CM | POA: Diagnosis not present

## 2013-03-16 DIAGNOSIS — R109 Unspecified abdominal pain: Secondary | ICD-10-CM | POA: Diagnosis not present

## 2013-03-16 DIAGNOSIS — E1110 Type 2 diabetes mellitus with ketoacidosis without coma: Secondary | ICD-10-CM | POA: Diagnosis not present

## 2013-03-16 LAB — BASIC METABOLIC PANEL
Calcium: 8.2 mg/dL — ABNORMAL LOW (ref 8.4–10.5)
Chloride: 107 mEq/L (ref 96–112)
GFR calc Af Amer: >90 mL/min (ref >90)
GFR calc non Af Amer: >90 mL/min (ref >90)
Glucose, Bld: 80 mg/dL (ref 70–99)
Potassium: 3.3 mEq/L — ABNORMAL LOW (ref 3.5–5.1)
Sodium: 137 mEq/L (ref 135–145)

## 2013-03-16 LAB — GLUCOSE, CAPILLARY: Glucose-Capillary: 74 mg/dL (ref 70–99)

## 2013-03-16 MED ORDER — POTASSIUM CHLORIDE CRYS ER 20 MEQ PO TBCR
40.0000 meq | EXTENDED_RELEASE_TABLET | Freq: Once | ORAL | Status: AC
  Administered 2013-03-16: 08:00:00 40 meq via ORAL
  Filled 2013-03-16: qty 2

## 2013-03-16 MED ORDER — INSULIN GLARGINE 100 UNIT/ML ~~LOC~~ SOLN
30.0000 [IU] | SUBCUTANEOUS | Status: DC
  Administered 2013-03-16: 30 [IU] via SUBCUTANEOUS
  Filled 2013-03-16: qty 0.3

## 2013-03-16 MED ORDER — GUAIFENESIN ER 600 MG PO TB12: 1200.0000 mg | ORAL_TABLET | Freq: Two times a day (BID) | ORAL | Status: DC

## 2013-03-16 NOTE — Discharge Summary (Signed)
Physician Discharge Summary  Mary [name] BJY:[id number] DOB: 1971/02/20 DOA: 03/12/2013  PCP: Dartha Lodge, FNP  Admit date: 03/12/2013 Discharge date: 03/16/2013  Time spent: 60 minutes  Recommendations for Outpatient Follow-up:  1. Followup with STEELE,ANTHONY, FNP as scheduled. Patient will need further diabetes education. Patient will need a basic metabolic profile done to followup on electrolytes and renal function.  Discharge Diagnoses:  Principal Problem:   DKA (diabetic ketoacidoses) Active Problems:   Bipolar 2 disorder   Cirrhosis of liver   Microcytic anemia   Tobacco abuse   Bipolar disorder   DM (diabetes mellitus), type 1, uncontrolled   Hypokalemia   Leukocytosis, unspecified   Nausea with vomiting   Discharge Condition: Stable and improved  Diet recommendation: Carb modified diet/patient to start off with a full liquid diet and advance as tolerated.  Filed Weights   03/13/13 0348  Weight: 55.929 kg (123 lb 4.8 oz)    History of present illness:  Mary [name] is a 42 y.o. female with history of diabetes mellitus type 2 presents to the ER because of nausea and vomiting with abdominal pain. Patient states her symptoms started yesterday morning. Pain is diffuse across the abdomen radiating to the back with persistent nausea and vomiting. Denies any diarrhea. Denies any chest pain shortness of breath dizziness loss of consciousness or focal deficits. Patient in the ER was found to have diabetic ketoacidosis. CT abdomen and pelvis was unremarkable. Patient has been started on IV insulin and admitted for further management. Patient states that she has missed her medications last few days. Patient admitted in the last few months for DKA   Hospital Course:  #1 DKA  Patient was admitted with nausea vomiting or abdominal pain and found to be in DKA on admission. Patient was placed on the glucose stabilized and hydrated with IV fluids and placed on anti-emetics and  supportive care. Likely secondary to medical noncompliance. Patient stated she ran out of her medications. CT of the abdomen and pelvis was negative. Acute abdominal series was negative. Urinalysis is negative. No signs or symptoms of infection. Patient was followed patient's anion gap closed and patient was transitioned to subcutaneous Lantus.  The patient was subsequently started on a clear liquid diet and diet advanced. However once patient not tolerating a regular diet she complained of excruciating abdominal pain and nausea. Patient's diet was subsequently stopped. Patient was placed on IV fluids. In a repeat basic metabolic profile was obtained to see whether patient was back in DKA. Patient did not have a anion gap. Patient was followed. Patient improved clinically was started on a clear liquid diet which she tolerated. Patient did not have any further abdominal pain on be discharged home in stable and improved condition. Patient is to followup with PCP as outpatient. Patient will likely benefit from outpatient diabetes education. Patient has been excised start on a full liquid diet and advance as tolerated.  #2 nausea/abdominal pain  Secondary to problem #1. CT of the abdomen and pelvis as well as acute abdominal films were all unremarkable. Improved with hydration. Had resolved by day of discharge. Repeat CMET to see if patient back in DKA.  #3 leukocytosis  Likely a reactive leukocytosis secondary to problem #1. Chest x-ray is negative for any acute infiltrate. Patient is afebrile. Urinalysis is negative. Patient's leukocytosis improved during the hospitalization and with correction of her DKA.  #4 microcytic anemia  Likely iron deficiency anemia in a menstruating female. H/H stable. No overt GI bleed. Follow  up as outpatient. #5 history of cirrhosis  Stable.  #6 bipolar disorder  Stable.  #7 hypokalemia  Repleted.  #8 tobacco abuse  Tobacco cessation.   Procedures: Acute abdominal series  03/13/2013  CT of the abdomen and pelvis 03/12/2013   Consultations:  None  Discharge Exam: Filed Vitals:   03/16/13 0810  BP: 132/70  Pulse: 70  Temp: 98.3 F (36.8 C)  Resp: 18    General: NAD Cardiovascular: RRR Respiratory: CTAB  Discharge Instructions      Discharge Orders   Future Appointments Provider Department Dept Phone   04/15/2013 1:45 PM Adam Phenix, MD [facility] [phone]   Future Orders Complete By Expires   Diet Carb Modified  As directed    Comments:     FULL LIQUIDS AND ADVANCE AS TOLERATED.   Discharge instructions  As directed    Comments:     Follow up with STEELE,ANTHONY, FNP as scheduled. PLEASE GET INSULIN.   Increase activity slowly  As directed        Medication List         aspirin EC 81 MG tablet  Take 81 mg by mouth daily.     calcium carbonate 500 MG chewable tablet  Commonly known as:  TUMS - dosed in mg elemental calcium  Chew 2 tablets by mouth daily as needed. For heartburn     diphenhydrAMINE 25 MG tablet  Commonly known as:  BENADRYL  Take 25 mg by mouth every 6 (six) hours as needed for itching.     gabapentin 400 MG capsule  Commonly known as:  NEURONTIN  Take 400 mg by mouth 3 (three) times daily.     glipiZIDE 5 MG tablet  Commonly known as:  GLUCOTROL  Take 5 mg by mouth 2 (two) times daily.     guaiFENesin 600 MG 12 hr tablet  Commonly known as:  MUCINEX  Take 2 tablets (1,200 mg total) by mouth 2 (two) times daily. Take for 5 days then stop.     hydrochlorothiazide 12.5 MG capsule  Commonly known as:  MICROZIDE  Take 12.5 mg by mouth daily.     HYDROcodone-acetaminophen 5-325 MG per tablet  Commonly known as:  NORCO/VICODIN  1 to 2 tabs every 4 to 6 hours as needed for pain.     insulin glargine 100 UNIT/ML injection  Commonly known as:  LANTUS  Inject 50 Units into the skin at bedtime.     medroxyPROGESTERone 10 MG tablet  Commonly known as:  PROVERA  Take 1 tablet (10 mg  total) by mouth daily.     meloxicam 15 MG tablet  Commonly known as:  MOBIC  Take 15 mg by mouth daily.     metFORMIN 1000 MG tablet  Commonly known as:  GLUCOPHAGE  Take 1,000 mg by mouth 2 (two) times daily.     metoCLOPramide 10 MG tablet  Commonly known as:  REGLAN  Take 10 mg by mouth 3 (three) times daily before meals.     multivitamin with minerals Tabs tablet  Take 1 tablet by mouth daily.     promethazine 25 MG tablet  Commonly known as:  PHENERGAN  Take 1 tablet (25 mg total) by mouth every 6 (six) hours as needed for nausea.     ranitidine 150 MG tablet  Commonly known as:  ZANTAC  Take 150 mg by mouth 2 (two) times daily.     risperidone 4 MG tablet  Commonly known as:  RISPERDAL  Take 2 mg by mouth at bedtime.     sitaGLIPtin 100 MG tablet  Commonly known as:  JANUVIA  Take 100 mg by mouth daily.       Allergies  Allergen Reactions  . Penicillins Rash   Follow-up Information   Follow up with STEELE,ANTHONY, FNP On 04/01/2013. (@2p ;[facility])    Specialty:  Nurse Practitioner       The results of significant diagnostics from this hospitalization (including imaging, microbiology, ancillary and laboratory) are listed below for reference.    Significant Diagnostic Studies: Ct Abdomen Pelvis W Contrast  03/12/2013   *RADIOLOGY REPORT*  Clinical Data: 42 year old female with abdominal pelvic pain, nausea and vomiting.  CT ABDOMEN AND PELVIS WITH CONTRAST  Technique:  Multidetector CT imaging of the abdomen and pelvis was performed following the standard protocol during bolus administration of intravenous contrast.  Contrast: 100 ml intravenous Omnipaque-300  Comparison: 11/17/2012 and prior CTs dating back to 08/22/2006  Findings: Small hiatal hernia is present.  The liver, spleen, gallbladder, kidneys, pancreas and adrenal glands are unremarkable.  No free fluid, enlarged lymph nodes, biliary dilation or abdominal aortic aneurysm  identified.  The bowel and bladder are unremarkable. There is no evidence of bowel obstruction or pneumoperitoneum.  No acute or suspicious bony abnormalities are identified. A moderate apex left thoracolumbar scoliosis is present.  IMPRESSION: No acute abnormalities.  Small hiatal hernia.   Original Report Authenticated By: Harmon Pier, M.D.   Dg Abd Acute W/chest  03/14/2013   CLINICAL DATA:  Nausea and vomiting. History of gastritis.  EXAM: ACUTE ABDOMEN SERIES (ABDOMEN 2 VIEW & CHEST 1 VIEW)  COMPARISON:  01/07/2013  FINDINGS: Frontal view of the chest demonstrates S-shaped spinal curvature. Midline trachea. Normal heart size and mediastinal contours. No pleural effusion or pneumothorax. Clear lungs.  Abdominal films demonstrate no free intraperitoneal air on upright positioning. No significant air fluid levels. No bowel distention on supine imaging. Phleboliths in the pelvis. No abnormal abdominal calcifications. No appendicolith. Low pelvis minimally excluded.  IMPRESSION: No acute findings.   Electronically Signed   By: Jeronimo Greaves   On: 03/14/2013 00:21    Microbiology: Recent Results (from the past 240 hour(s))  MRSA PCR SCREENING     Status: None   Collection Time    03/13/13  5:22 AM      Result Value Range Status   MRSA by PCR NEGATIVE  NEGATIVE Final   Comment:            The GeneXpert MRSA Assay (FDA     approved for NASAL specimens     only), is one component of a     comprehensive MRSA colonization     surveillance program. It is not     intended to diagnose MRSA     infection nor to guide or     monitor treatment for     MRSA infections.     Labs: Basic Metabolic Panel:  Recent Labs Lab 03/14/13 0330 03/14/13 0706 03/14/13 1107 03/15/13 0440 03/15/13 1723 03/16/13 0500  NA 136 134* 133* 139 137 137  K 3.2* 3.2* 3.2* 3.6 3.4* 3.3*  CL 108 106 105 110 106 107  CO2 20 19 19 20 21 21   GLUCOSE 145* 202* 148* 81 97 80  BUN 12 11 9  5* 6 6  CREATININE 0.46* 0.47*  0.44* 0.49* 0.48* 0.54  CALCIUM 8.2* 7.9* 8.2* 8.6 8.5 8.2*  MG 1.8  --   --  1.9  --   --    Liver Function Tests:  Recent Labs Lab 03/12/13 1830 03/13/13 0410 03/15/13 1723  AST 51* 54* 41*  ALT 45* 47* 41*  ALKPHOS 81 75 64  BILITOT 0.3 0.2* 0.3  PROT 8.2 7.7 6.1  ALBUMIN 4.1 3.7 2.9*    Recent Labs Lab 03/12/13 1830 03/13/13 1937 03/15/13 1723  LIPASE 26 16 15    No results found for this basename: AMMONIA,  in the last 168 hours CBC:  Recent Labs Lab 03/12/13 1830 03/13/13 0410 03/14/13 0330 03/15/13 0440  WBC 12.8* 15.3* 17.0* 11.3*  NEUTROABS 10.5* 11.5* 10.5* 4.9  HGB 12.1 11.7* 9.8* 10.9*  HCT 37.4 37.0 31.5* 34.3*  MCV 70.6* 71.6* 71.6* 71.2*  PLT 172 157 149* 150   Cardiac Enzymes:  Recent Labs Lab 03/13/13 0410  TROPONINI <0.30   BNP: BNP (last 3 results) No results found for this basename: PROBNP,  in the last 8760 hours CBG:  Recent Labs Lab 03/15/13 1214 03/15/13 1713 03/15/13 2020 03/16/13 0045 03/16/13 0437  GLUCAP 131* 98 76 65* 74       Signed:  Yvone Slape  [facility] 03/16/2013, 1:43 PM

## 2013-03-16 NOTE — Progress Notes (Signed)
Patient had 13 beats of V.tach, patient resting in bed, asymptomatic, vital signs stable. On call Schorr NP made aware. Will continue to monitor.

## 2013-03-16 NOTE — Progress Notes (Signed)
D/C instructions reviewed w/ pt. All questions answered, no further questions. Pt d/c in w/c by NT in stable condition. Pt in possession of d/c instructions and all personal belongings.

## 2013-03-16 NOTE — Care Management Note (Addendum)
    Page 1 of 1   03/16/2013     2:11:46 PM   CARE MANAGEMENT NOTE 03/16/2013  Patient:  Mary, [name]   Account Number:  [id number]  Date Initiated:  03/13/2013  Documentation initiated by:  [facility]  Subjective/Objective Assessment:   42 year old female admitted with DKA.     Action/Plan:   Lives in a homeless shelter.   Anticipated DC Date:  03/16/2013   Anticipated DC Plan:  HOME/SELF CARE  In-house referral  Clinical Social Worker      DC Planning Services  CM consult      Choice offered to / List presented to:             Status of service:  Completed, signed off Medicare Important Message given?  NA - LOS <3 / Initial given by admissions (If response is "NO", the following Medicare IM given date fields will be blank) Date Medicare IM given:   Date Additional Medicare IM given:    Discharge Disposition:  HOME/SELF CARE  Per UR Regulation:  Reviewed for med. necessity/level of care/duration of stay  If discussed at Long Length of Stay Meetings, dates discussed:    Comments:  03/16/13 Zyion Leidner RN,BSN NCM [phone] D/C HOME.F/U APPT MADE SEE F/U SECTIONOF D/C.HAS ALL MEDICINE.RECEIVES MEDS FROM [facility], & FROM PCP OFFICE.HAS INSULIN,HAS GLUCOMETER.NEEDS BUS PASS-SW NTOIFIED. DKA,VT YESTERDAY,ADVANCED DIET CLEARS,IVF@75 .HAS PCP,PHARMACY.NO ANTICIPATED D/C PLANS.

## 2013-03-16 NOTE — Progress Notes (Signed)
Clinical Social Work  CSW received referral from CM that patient needs assistance with transportation. CSW met with patient at bedside who is knowledgeable on how to ride the bus and declines directions. CSW provided bus pass and informed RN that patient has transportation at DC.  CSW is signing off but available if needed.  Unk Lightning, LCSW (Coverage for [employer])

## 2013-03-17 LAB — GLUCOSE, CAPILLARY: Glucose-Capillary: 187 mg/dL — ABNORMAL HIGH (ref 70–99)

## 2013-04-01 ENCOUNTER — Observation Stay (HOSPITAL_COMMUNITY)
Admission: EM | Admit: 2013-04-01 | Discharge: 2013-04-02 | Disposition: A | Discharge status: Home / Self Care | Payer: No Typology Code available for payment source | Attending: Family Medicine | Admitting: Family Medicine

## 2013-04-01 ENCOUNTER — Encounter (HOSPITAL_COMMUNITY): Payer: Self-pay | Admitting: Emergency Medicine

## 2013-04-01 DIAGNOSIS — Z91148 Patient's other noncompliance with medication regimen for other reason: Secondary | ICD-10-CM

## 2013-04-01 DIAGNOSIS — Z7982 Long term (current) use of aspirin: Secondary | ICD-10-CM | POA: Insufficient documentation

## 2013-04-01 DIAGNOSIS — E1110 Type 2 diabetes mellitus with ketoacidosis without coma: Secondary | ICD-10-CM

## 2013-04-01 DIAGNOSIS — G40909 Epilepsy, unspecified, not intractable, without status epilepticus: Secondary | ICD-10-CM | POA: Diagnosis present

## 2013-04-01 DIAGNOSIS — F17200 Nicotine dependence, unspecified, uncomplicated: Secondary | ICD-10-CM | POA: Insufficient documentation

## 2013-04-01 DIAGNOSIS — A599 Trichomoniasis, unspecified: Secondary | ICD-10-CM

## 2013-04-01 DIAGNOSIS — R112 Nausea with vomiting, unspecified: Secondary | ICD-10-CM

## 2013-04-01 DIAGNOSIS — K7460 Unspecified cirrhosis of liver: Secondary | ICD-10-CM | POA: Insufficient documentation

## 2013-04-01 DIAGNOSIS — E1010 Type 1 diabetes mellitus with ketoacidosis without coma: Secondary | ICD-10-CM | POA: Insufficient documentation

## 2013-04-01 DIAGNOSIS — I739 Peripheral vascular disease, unspecified: Secondary | ICD-10-CM | POA: Insufficient documentation

## 2013-04-01 DIAGNOSIS — Z9114 Patient's other noncompliance with medication regimen: Secondary | ICD-10-CM

## 2013-04-01 DIAGNOSIS — Z794 Long term (current) use of insulin: Secondary | ICD-10-CM | POA: Insufficient documentation

## 2013-04-01 DIAGNOSIS — B182 Chronic viral hepatitis C: Secondary | ICD-10-CM

## 2013-04-01 DIAGNOSIS — F319 Bipolar disorder, unspecified: Secondary | ICD-10-CM

## 2013-04-01 DIAGNOSIS — F3181 Bipolar II disorder: Secondary | ICD-10-CM

## 2013-04-01 DIAGNOSIS — R109 Unspecified abdominal pain: Principal | ICD-10-CM

## 2013-04-01 DIAGNOSIS — D72829 Elevated white blood cell count, unspecified: Secondary | ICD-10-CM | POA: Insufficient documentation

## 2013-04-01 DIAGNOSIS — Z720 Tobacco use: Secondary | ICD-10-CM | POA: Diagnosis present

## 2013-04-01 DIAGNOSIS — Z79899 Other long term (current) drug therapy: Secondary | ICD-10-CM | POA: Insufficient documentation

## 2013-04-01 LAB — COMPREHENSIVE METABOLIC PANEL
BUN: 19 mg/dL (ref 6–23)
CO2: 23 mEq/L (ref 19–32)
Calcium: 9.7 mg/dL (ref 8.4–10.5)
Creatinine, Ser: 0.53 mg/dL (ref 0.50–1.10)
GFR calc Af Amer: >90 mL/min (ref >90)
Glucose, Bld: 203 mg/dL — ABNORMAL HIGH (ref 70–99)
Sodium: 133 mEq/L — ABNORMAL LOW (ref 135–145)
Total Protein: 7.9 g/dL (ref 6.0–8.3)

## 2013-04-01 LAB — CBC WITH DIFFERENTIAL/PLATELET
Basophils Absolute: 0 10*3/uL (ref 0.0–0.1)
Basophils Relative: 0 % (ref 0–1)
Eosinophils Absolute: 0.1 10*3/uL (ref 0.0–0.7)
Eosinophils Relative: 1 % (ref 0–5)
HCT: 37 % (ref 36.0–46.0)
Hemoglobin: 11.9 g/dL — ABNORMAL LOW (ref 12.0–15.0)
Lymphs Abs: 3.3 10*3/uL (ref 0.7–4.0)
MCH: 22.8 pg — ABNORMAL LOW (ref 26.0–34.0)
MCHC: 32.2 g/dL (ref 30.0–36.0)
MCV: 70.9 fL — ABNORMAL LOW (ref 78.0–100.0)
Monocytes Absolute: 0.6 10*3/uL (ref 0.1–1.0)
Neutro Abs: 7.3 10*3/uL (ref 1.7–7.7)
Neutrophils Relative %: 65 % (ref 43–77)
RDW: 14.9 % (ref 11.5–15.5)

## 2013-04-01 LAB — URINALYSIS, ROUTINE W REFLEX MICROSCOPIC
Nitrite: NEGATIVE
Protein, ur: NEGATIVE mg/dL
Urobilinogen, UA: 1 mg/dL (ref 0.0–1.0)

## 2013-04-01 LAB — URINE MICROSCOPIC-ADD ON

## 2013-04-01 LAB — LIPASE, BLOOD: Lipase: 30 U/L (ref 11–59)

## 2013-04-01 LAB — AMYLASE: Amylase: 211 U/L — ABNORMAL HIGH (ref 0–105)

## 2013-04-01 LAB — POCT PREGNANCY, URINE: Preg Test, Ur: NEGATIVE

## 2013-04-01 MED ORDER — MORPHINE SULFATE 4 MG/ML IJ SOLN
4.0000 mg | Freq: Once | INTRAMUSCULAR | Status: AC
  Administered 2013-04-01: 4 mg via INTRAVENOUS
  Filled 2013-04-01: qty 1

## 2013-04-01 MED ORDER — ONDANSETRON HCL 4 MG/2ML IJ SOLN
4.0000 mg | Freq: Once | INTRAMUSCULAR | Status: AC
  Administered 2013-04-01: 4 mg via INTRAVENOUS
  Filled 2013-04-01: qty 2

## 2013-04-01 MED ORDER — SODIUM CHLORIDE 0.9 % IV SOLN
INTRAVENOUS | Status: DC
  Administered 2013-04-01 – 2013-04-02 (×4): via INTRAVENOUS

## 2013-04-01 NOTE — ED Provider Notes (Signed)
CSN: [id number]     Arrival date & time 04/01/13  18-Nov-2199 History   First MD Initiated Contact with Patient 04/01/13 11-19-23     Chief Complaint  Patient presents with  . Abdominal Pain   (Consider location/radiation/quality/duration/timing/severity/associated sxs/prior Treatment) Patient is a 42 y.o. female presenting with abdominal pain. The history is provided by the patient and medical records. No language interpreter was used.  Abdominal Pain Pain location:  Periumbilical, RUQ and epigastric Pain quality: gnawing   Pain radiates to:  Does not radiate Pain severity:  Severe Onset quality:  Sudden Duration:  12 hours Timing:  Constant Progression:  Worsening Chronicity:  Recurrent Ineffective treatments:  Not moving, position changes and vomiting Associated symptoms: nausea and vomiting   Associated symptoms: no diarrhea and no fever     Past Medical History  Diagnosis Date  . Diabetes mellitus     diagnosed in 1996-always been on insulin  . Kidney stone   . Seizure disorder     started with pregnancy of first son  . Gastritis   . Bipolar 2 disorder   . Post traumatic stress disorder     "flipping out" after people close to her died  . DKA (diabetic ketoacidoses)     Recurrent admissions for DKA, medication non-complaince, poor social situation  . Heart murmur   . Arthritis     r ankle-S/P surgery 11-18-05)  . Peripheral vascular disease     numbness bilaterally feet  . Anemia     childhood  . Depression     childhood  . Hepatitis C     biopsy in 2010-no rx-was supposed to see a hepatologist in [place].  With cirrhosis  . Neuropathy     in legs and feet and hands   Past Surgical History  Procedure Laterality Date  . Ankle surgery    . Cesarean section    . Endometrial biopsy  06/22/2012   Family History  Problem Relation Age of Onset  . Diabetes Mother     currently 30  . Fibromyalgia Mother   . Cirrhosis Father     died in 1997-11-18   History  Substance  Use Topics  . Smoking status: Current Every Day Smoker -- 0.10 packs/day for 25 years    Types: Cigarettes  . Smokeless tobacco: Never Used  . Alcohol Use: No     Comment: Endorses hasn't been drinking   OB History   Grav Para Term Preterm Abortions TAB SAB Ect Mult Living   4 3 3  0 1 0 1 0 1 2     Review of Systems  Constitutional: Negative for fever.  Gastrointestinal: Positive for nausea, vomiting and abdominal pain. Negative for diarrhea.  All other systems reviewed and are negative.    Allergies  Penicillins  Home Medications   Current Outpatient Rx  Name  Route  Sig  Dispense  Refill  . aspirin EC 81 MG tablet   Oral   Take 81 mg by mouth daily.          . calcium carbonate (TUMS - DOSED IN MG ELEMENTAL CALCIUM) 500 MG chewable tablet   Oral   Chew 2 tablets by mouth daily as needed. For heartburn         . diphenhydrAMINE (BENADRYL) 25 MG tablet   Oral   Take 25 mg by mouth every 6 (six) hours as needed for itching.         . gabapentin (NEURONTIN) 400 MG capsule  Oral   Take 400 mg by mouth 3 (three) times daily.         Marland Kitchen glipiZIDE (GLUCOTROL) 5 MG tablet   Oral   Take 5 mg by mouth 2 (two) times daily.         Marland Kitchen guaiFENesin (MUCINEX) 600 MG 12 hr tablet   Oral   Take 2 tablets (1,200 mg total) by mouth 2 (two) times daily. Take for 5 days then stop.   20 tablet   0   . hydrochlorothiazide (MICROZIDE) 12.5 MG capsule   Oral   Take 12.5 mg by mouth daily.         Marland Kitchen HYDROcodone-acetaminophen (NORCO/VICODIN) 5-325 MG per tablet      1 to 2 tabs every 4 to 6 hours as needed for pain.   20 tablet   0   . insulin glargine (LANTUS) 100 UNIT/ML injection   Subcutaneous   Inject 50 Units into the skin at bedtime.         . medroxyPROGESTERone (PROVERA) 10 MG tablet   Oral   Take 1 tablet (10 mg total) by mouth daily.   10 tablet   0   . meloxicam (MOBIC) 15 MG tablet   Oral   Take 15 mg by mouth daily.          . metFORMIN  (GLUCOPHAGE) 1000 MG tablet   Oral   Take 1,000 mg by mouth 2 (two) times daily.         . metoCLOPramide (REGLAN) 10 MG tablet   Oral   Take 10 mg by mouth 3 (three) times daily before meals.         . Multiple Vitamin (MULTIVITAMIN WITH MINERALS) TABS tablet   Oral   Take 1 tablet by mouth daily.         . promethazine (PHENERGAN) 25 MG tablet   Oral   Take 1 tablet (25 mg total) by mouth every 6 (six) hours as needed for nausea.   20 tablet   0   . ranitidine (ZANTAC) 150 MG tablet   Oral   Take 150 mg by mouth 2 (two) times daily.         . risperidone (RISPERDAL) 4 MG tablet   Oral   Take 2 mg by mouth at bedtime.          . sitaGLIPtin (JANUVIA) 100 MG tablet   Oral   Take 100 mg by mouth daily.          BP 134/79  Pulse 89  Temp(Src) 98.3 F (36.8 C) (Oral)  Resp 18  SpO2 98% Physical Exam  Nursing note and vitals reviewed. Constitutional: She is oriented to person, place, and time. She appears cachectic. She has a sickly appearance.  HENT:  Head: Normocephalic.  Eyes: Pupils are equal, round, and reactive to light.  Neck: Normal range of motion.  Cardiovascular: Normal rate and regular rhythm.   Pulmonary/Chest: Effort normal and breath sounds normal.  Abdominal: Soft. There is tenderness.    Musculoskeletal: She exhibits no edema and no tenderness.  Lymphadenopathy:    She has no cervical adenopathy.  Neurological: She is alert and oriented to person, place, and time.  Skin: Skin is warm and dry.  Psychiatric: She has a normal mood and affect.    ED Course  Procedures (including critical care time) Labs Review Labs Reviewed  CBC WITH DIFFERENTIAL  COMPREHENSIVE METABOLIC PANEL  LIPASE, BLOOD  AMYLASE  URINALYSIS, ROUTINE W  REFLEX MICROSCOPIC   Imaging Review No results found.  EKG Interpretation   None     Labs reviewed. Mild leukocytosis.   UA positive for trichomonas.  Patient signed out to Dr. Elesa Massed at end of shift.   Awaiting assessment of response to treatment.  If symptoms controlled, patient may be discharged home with out-patient follow-up.  If not, consider admission for intractable vomiting.  MDM  Abdominal pain. Nausea and vomiting. Trichomonas.    Jimmye Norman, NP 04/02/13 [phone]

## 2013-04-01 NOTE — ED Notes (Signed)
Bed: WU98 Expected date:  Expected time:  Means of arrival:  Comments: EMS/adominal pain

## 2013-04-01 NOTE — ED Notes (Signed)
Per ems pt c/o abd pain started this am with n/v no diarrhea

## 2013-04-02 ENCOUNTER — Emergency Department (HOSPITAL_COMMUNITY): Payer: No Typology Code available for payment source

## 2013-04-02 ENCOUNTER — Encounter (HOSPITAL_COMMUNITY): Payer: Self-pay

## 2013-04-02 DIAGNOSIS — E1110 Type 2 diabetes mellitus with ketoacidosis without coma: Secondary | ICD-10-CM

## 2013-04-02 DIAGNOSIS — R109 Unspecified abdominal pain: Secondary | ICD-10-CM

## 2013-04-02 DIAGNOSIS — R112 Nausea with vomiting, unspecified: Secondary | ICD-10-CM

## 2013-04-02 DIAGNOSIS — F3189 Other bipolar disorder: Secondary | ICD-10-CM

## 2013-04-02 DIAGNOSIS — B182 Chronic viral hepatitis C: Secondary | ICD-10-CM

## 2013-04-02 DIAGNOSIS — F319 Bipolar disorder, unspecified: Secondary | ICD-10-CM

## 2013-04-02 LAB — GLUCOSE, CAPILLARY
Glucose-Capillary: 237 mg/dL — ABNORMAL HIGH (ref 70–99)
Glucose-Capillary: 203 mg/dL — ABNORMAL HIGH (ref 70–99)
Glucose-Capillary: 151 mg/dL — ABNORMAL HIGH (ref 70–99)

## 2013-04-02 LAB — CBC
HCT: 33 % — ABNORMAL LOW (ref 36.0–46.0)
Hemoglobin: 10.6 g/dL — ABNORMAL LOW (ref 12.0–15.0)
MCH: 23 pg — ABNORMAL LOW (ref 26.0–34.0)
MCHC: 32.1 g/dL (ref 30.0–36.0)
MCV: 71.7 fL — ABNORMAL LOW (ref 78.0–100.0)
Platelets: 181 10*3/uL (ref 150–400)
RDW: 15.1 % (ref 11.5–15.5)

## 2013-04-02 LAB — AMYLASE: Amylase: 100 U/L (ref 0–105)

## 2013-04-02 LAB — CREATININE, SERUM: GFR calc non Af Amer: >90 mL/min (ref >90)

## 2013-04-02 MED ORDER — PROMETHAZINE HCL 25 MG/ML IJ SOLN
25.0000 mg | Freq: Once | INTRAMUSCULAR | Status: AC
  Administered 2013-04-02: 25 mg via INTRAVENOUS
  Filled 2013-04-02: qty 1

## 2013-04-02 MED ORDER — IOHEXOL 300 MG/ML  SOLN
100.0000 mL | Freq: Once | INTRAMUSCULAR | Status: AC | PRN
  Administered 2013-04-02: 100 mL via INTRAVENOUS

## 2013-04-02 MED ORDER — ONDANSETRON HCL 4 MG PO TABS: 4.0000 mg | ORAL_TABLET | Freq: Four times a day (QID) | ORAL | Status: DC | PRN

## 2013-04-02 MED ORDER — HYDROMORPHONE HCL PF 1 MG/ML IJ SOLN
1.0000 mg | INTRAMUSCULAR | Status: DC | PRN
  Administered 2013-04-02: 1 mg via INTRAVENOUS
  Filled 2013-04-02: qty 1

## 2013-04-02 MED ORDER — MEDROXYPROGESTERONE ACETATE 10 MG PO TABS
10.0000 mg | ORAL_TABLET | Freq: Every day | ORAL | Status: DC
  Administered 2013-04-02: 10 mg via ORAL
  Filled 2013-04-02: qty 1

## 2013-04-02 MED ORDER — HYDROMORPHONE HCL PF 1 MG/ML IJ SOLN
0.5000 mg | Freq: Once | INTRAMUSCULAR | Status: AC
  Administered 2013-04-02: 1 mg via INTRAVENOUS
  Filled 2013-04-02: qty 1

## 2013-04-02 MED ORDER — HEPARIN SODIUM (PORCINE) 5000 UNIT/ML IJ SOLN
5000.0000 [IU] | Freq: Three times a day (TID) | INTRAMUSCULAR | Status: DC
  Administered 2013-04-02: 5000 [IU] via SUBCUTANEOUS
  Filled 2013-04-02 (×4): qty 1

## 2013-04-02 MED ORDER — INSULIN GLARGINE 100 UNIT/ML ~~LOC~~ SOLN
50.0000 [IU] | Freq: Every day | SUBCUTANEOUS | Status: DC
  Filled 2013-04-02: qty 0.5

## 2013-04-02 MED ORDER — ONDANSETRON HCL 4 MG/2ML IJ SOLN: 4.0000 mg | Freq: Four times a day (QID) | INTRAMUSCULAR | Status: DC | PRN

## 2013-04-02 MED ORDER — INSULIN ASPART 100 UNIT/ML ~~LOC~~ SOLN: 0.0000 [IU] | Freq: Every day | SUBCUTANEOUS | Status: DC

## 2013-04-02 MED ORDER — HYDROCODONE-ACETAMINOPHEN 5-325 MG PO TABS: 1.0000 | ORAL_TABLET | Freq: Four times a day (QID) | ORAL | Status: DC | PRN

## 2013-04-02 MED ORDER — METRONIDAZOLE IN NACL 5-0.79 MG/ML-% IV SOLN
500.0000 mg | Freq: Three times a day (TID) | INTRAVENOUS | Status: DC
  Administered 2013-04-02 (×2): 500 mg via INTRAVENOUS
  Filled 2013-04-02 (×4): qty 100

## 2013-04-02 MED ORDER — GLIPIZIDE 5 MG PO TABS
5.0000 mg | ORAL_TABLET | Freq: Two times a day (BID) | ORAL | Status: DC
  Administered 2013-04-02 (×2): 5 mg via ORAL
  Filled 2013-04-02 (×3): qty 1

## 2013-04-02 MED ORDER — DICYCLOMINE HCL 10 MG PO CAPS
10.0000 mg | ORAL_CAPSULE | Freq: Once | ORAL | Status: AC
  Administered 2013-04-02: 10 mg via ORAL
  Filled 2013-04-02: qty 1

## 2013-04-02 MED ORDER — LINAGLIPTIN 5 MG PO TABS
5.0000 mg | ORAL_TABLET | Freq: Every day | ORAL | Status: DC
  Administered 2013-04-02: 5 mg via ORAL
  Filled 2013-04-02: qty 1

## 2013-04-02 MED ORDER — RISPERIDONE 2 MG PO TABS
2.0000 mg | ORAL_TABLET | Freq: Every day | ORAL | Status: DC
  Filled 2013-04-02: qty 1

## 2013-04-02 MED ORDER — PROMETHAZINE HCL 25 MG PO TABS: 25.0000 mg | ORAL_TABLET | Freq: Four times a day (QID) | ORAL | Status: DC | PRN

## 2013-04-02 MED ORDER — GABAPENTIN 400 MG PO CAPS
400.0000 mg | ORAL_CAPSULE | Freq: Three times a day (TID) | ORAL | Status: DC
  Administered 2013-04-02 (×2): 400 mg via ORAL
  Filled 2013-04-02 (×3): qty 1

## 2013-04-02 MED ORDER — HYDROCODONE-ACETAMINOPHEN 5-325 MG PO TABS: 2.0000 | ORAL_TABLET | ORAL | Status: DC | PRN

## 2013-04-02 MED ORDER — CEFTRIAXONE SODIUM 250 MG IJ SOLR
250.0000 mg | Freq: Once | INTRAMUSCULAR | Status: AC
  Administered 2013-04-02: 250 mg via INTRAMUSCULAR
  Filled 2013-04-02 (×2): qty 250

## 2013-04-02 MED ORDER — INSULIN ASPART 100 UNIT/ML ~~LOC~~ SOLN
0.0000 [IU] | Freq: Three times a day (TID) | SUBCUTANEOUS | Status: DC
  Administered 2013-04-02 (×2): 5 [IU] via SUBCUTANEOUS
  Administered 2013-04-02: 3 [IU] via SUBCUTANEOUS

## 2013-04-02 MED ORDER — ASPIRIN EC 81 MG PO TBEC
81.0000 mg | DELAYED_RELEASE_TABLET | Freq: Every day | ORAL | Status: DC
  Administered 2013-04-02: 81 mg via ORAL
  Filled 2013-04-02: qty 1

## 2013-04-02 MED ORDER — METRONIDAZOLE 500 MG PO TABS
2000.0000 mg | ORAL_TABLET | Freq: Once | ORAL | Status: AC
  Administered 2013-04-02: 2000 mg via ORAL
  Filled 2013-04-02: qty 4

## 2013-04-02 MED ORDER — FAMOTIDINE IN NACL 20-0.9 MG/50ML-% IV SOLN
20.0000 mg | Freq: Two times a day (BID) | INTRAVENOUS | Status: DC
  Administered 2013-04-02: 20 mg via INTRAVENOUS
  Filled 2013-04-02 (×3): qty 50

## 2013-04-02 MED ORDER — DEXTROSE-NACL 5-0.9 % IV SOLN
INTRAVENOUS | Status: DC
  Administered 2013-04-02: 06:00:00 via INTRAVENOUS

## 2013-04-02 MED ORDER — METRONIDAZOLE 500 MG PO TABS: 500.0000 mg | ORAL_TABLET | Freq: Two times a day (BID) | ORAL | Status: DC

## 2013-04-02 MED ORDER — ONDANSETRON HCL 4 MG/2ML IJ SOLN
4.0000 mg | Freq: Once | INTRAMUSCULAR | Status: AC
  Administered 2013-04-02: 4 mg via INTRAVENOUS
  Filled 2013-04-02: qty 2

## 2013-04-02 MED ORDER — AZITHROMYCIN 250 MG PO TABS
1000.0000 mg | ORAL_TABLET | Freq: Once | ORAL | Status: AC
  Administered 2013-04-02: 1000 mg via ORAL
  Filled 2013-04-02: qty 4

## 2013-04-02 NOTE — ED Provider Notes (Addendum)
1:53 AM  Assumed care.  Patient is a 42 y.o. female with a history of diabetes, multiple episodes of DKA who presents emergency department with right-sided abdominal pain, periumbilical abdominal pain and vomiting. Patient's labs are unremarkable other than a very slightly elevated anion gap of 13 which is likely improved after IV fluids. On my exam, patient is medically stable, her abdomen is soft and nontender to palpation diffusely. She still reports feeling very nauseated and is repeatedly stating that I have not seen any further vomiting. We'll give her a new emesis basin, repeat dose of Dilaudid and Zofran, by mouth challenge. If patient has continued vomiting, she may need admission for intractable vomiting.  Patient is also being treated with Flagyl for Trichomonas in her urine. We'll also empirically treat for gonorrhea and Chlamydia. Patient has blood in her urine but reports she is currently on her menstrual cycle. No other signs of UTI.  3:34 AM  Pt still vomiting after multiple rounds of antibiotics, pain medication.  Continued abdominal cramping. Unable to tolerate by mouth. Patient's PCP is Dartha Lodge in [place]. Patient has been admitted several times in the past for DKA and intractable vomiting.  We'll discuss with hospitalist for admission. Her abdominal exam is still very benign and I do not feel she needs abdominal imaging at this time.  Patient has had multiple negative abdominal CT scans in the past.  4:01 AM  Have ordered CT scan per hospitalist request.  Layla Maw Ward, DO 04/02/13 0350  Layla Maw Ward, DO 04/02/13 [id number]

## 2013-04-02 NOTE — H&P (Addendum)
[facility] History and Physical  Mary [name] MWN:[id number] DOB: [birth year]    PCP:   Dartha Lodge, FNP   Chief Complaint: Abdominal pain, nausea and vomiting.  HPI: Mary [name] is an 42 y.o. female with hx of intermittent abdominal pain, nausea and vomiting, hx of DM2, Bipolar disorder, recent DKA, chronic hepatitis, presents to the ER with abdominal pain for one day.  She doesn't have any black or bloody stool.  She has taken pain medication and constantly requested pain meds.  She got quite angry without getting the pain med promptly.  Evalaution in the ER included lipase of 30 and amylase of 200's.  She has a WBC of 11K, Hb of 11 g/dL, and normal renal fx tests.  Her BS was 211, with bicarb of 23.  She was given several rounds of pain meds, along with antiemetics, but without complete relief, so hospitalist was asked to admit her for abdominal pain.  Rewiew of Systems:  Constitutional: Negative for malaise, fever and chills. No significant weight loss or weight gain Eyes: Negative for eye pain, redness and discharge, diplopia, visual changes, or flashes of light. ENMT: Negative for ear pain, hoarseness, nasal congestion, sinus pressure and sore throat. No headaches; tinnitus, drooling, or problem swallowing. Cardiovascular: Negative for chest pain, palpitations, diaphoresis, dyspnea and peripheral edema. ; No orthopnea, PND Respiratory: Negative for cough, hemoptysis, wheezing and stridor. No pleuritic chestpain. Gastrointestinal: Negative for melena, blood in stool, hematemesis, jaundice and rectal bleeding.    Genitourinary: Negative for frequency, dysuria, incontinence,flank pain and hematuria; Musculoskeletal: Negative for back pain and neck pain. Negative for swelling and trauma.;  Skin: . Negative for pruritus, rash, abrasions, bruising and skin lesion.; ulcerations Neuro: Negative for headache, lightheadedness and neck stiffness. Negative for weakness, altered level of  consciousness , altered mental status, extremity weakness, burning feet, involuntary movement, seizure and syncope.  Psych: negative for insomnia, tearfulness, panic attacks, hallucinations, paranoia, suicidal or homicidal ideation    Past Medical History  Diagnosis Date  . Diabetes mellitus     diagnosed in 1996-always been on insulin  . Kidney stone   . Seizure disorder     started with pregnancy of first son  . Gastritis   . Bipolar 2 disorder   . Post traumatic stress disorder     "flipping out" after people close to her died  . DKA (diabetic ketoacidoses)     Recurrent admissions for DKA, medication non-complaince, poor social situation  . Heart murmur   . Arthritis     r ankle-S/P surgery (2007)  . Peripheral vascular disease     numbness bilaterally feet  . Anemia     childhood  . Depression     childhood  . Hepatitis C     biopsy in 2010-no rx-was supposed to see a hepatologist in [place].  With cirrhosis  . Neuropathy     in legs and feet and hands    Past Surgical History  Procedure Laterality Date  . Ankle surgery    . Cesarean section    . Endometrial biopsy  06/22/2012    Medications:  HOME MEDS: Prior to Admission medications   Medication Sig Start Date End Date Taking? Authorizing Provider  aspirin EC 81 MG tablet Take 81 mg by mouth daily.    Yes Historical Provider, MD  calcium carbonate (TUMS - DOSED IN MG ELEMENTAL CALCIUM) 500 MG chewable tablet Chew 2 tablets by mouth daily as needed. For heartburn   Yes Historical Provider,  MD  gabapentin (NEURONTIN) 400 MG capsule Take 400 mg by mouth 3 (three) times daily.   Yes Historical Provider, MD  glipiZIDE (GLUCOTROL) 5 MG tablet Take 5 mg by mouth 2 (two) times daily.   Yes Historical Provider, MD  hydrochlorothiazide (MICROZIDE) 12.5 MG capsule Take 12.5 mg by mouth daily.   Yes Historical Provider, MD  HYDROcodone-acetaminophen (NORCO/VICODIN) 5-325 MG per tablet Take 1 tablet by mouth every 6  (six) hours as needed for pain.   Yes Historical Provider, MD  insulin glargine (LANTUS) 100 UNIT/ML injection Inject 50 Units into the skin at bedtime.   Yes Historical Provider, MD  medroxyPROGESTERone (PROVERA) 10 MG tablet Take 1 tablet (10 mg total) by mouth daily. 02/25/13  Yes Reuben Likes, MD  meloxicam (MOBIC) 15 MG tablet Take 15 mg by mouth daily.    Yes Historical Provider, MD  metFORMIN (GLUCOPHAGE) 1000 MG tablet Take 1,000 mg by mouth 2 (two) times daily.   Yes Historical Provider, MD  metoCLOPramide (REGLAN) 10 MG tablet Take 10 mg by mouth 3 (three) times daily before meals.   Yes Historical Provider, MD  Multiple Vitamin (MULTIVITAMIN WITH MINERALS) TABS tablet Take 1 tablet by mouth daily.   Yes Historical Provider, MD  promethazine (PHENERGAN) 25 MG tablet Take 1 tablet (25 mg total) by mouth every 6 (six) hours as needed for nausea. 01/09/13  Yes Linward Headland, MD  ranitidine (ZANTAC) 150 MG tablet Take 150 mg by mouth 2 (two) times daily.   Yes Historical Provider, MD  risperidone (RISPERDAL) 4 MG tablet Take 2 mg by mouth at bedtime.    Yes Historical Provider, MD  sitaGLIPtin (JANUVIA) 100 MG tablet Take 100 mg by mouth daily.   Yes Historical Provider, MD  diphenhydrAMINE (BENADRYL) 25 MG tablet Take 25 mg by mouth every 6 (six) hours as needed for itching.    Historical Provider, MD  HYDROcodone-acetaminophen (NORCO/VICODIN) 5-325 MG per tablet Take 2 tablets by mouth every 4 (four) hours as needed for pain. 04/02/13   Jimmye Norman, NP  metroNIDAZOLE (FLAGYL) 500 MG tablet Take 1 tablet (500 mg total) by mouth 2 (two) times daily. 04/02/13   Jimmye Norman, NP  promethazine (PHENERGAN) 25 MG tablet Take 1 tablet (25 mg total) by mouth every 6 (six) hours as needed for nausea. 04/02/13   Jimmye Norman, NP     Allergies:  Allergies  Allergen Reactions  . Penicillins Rash    Social History:   reports that she has been smoking Cigarettes.  She has a 2.5  pack-year smoking history. She has never used smokeless tobacco. She reports that she uses illicit drugs (Marijuana) about once per week. She reports that she does not drink alcohol.  Family History: Family History  Problem Relation Age of Onset  . Diabetes Mother     currently 32  . Fibromyalgia Mother   . Cirrhosis Father     died in 1997/11/21     Physical Exam: Filed Vitals:   04/01/13 11/22/02 04/01/13 11/22/06 04/02/13 0120  BP:  134/79 143/72  Pulse:  89 93  Temp:  98.3 F (36.8 C)   TempSrc:  Oral   Resp:  18 14  SpO2: 99% 98% 97%   Blood pressure 143/72, pulse 93, temperature 98.3 F (36.8 C), temperature source Oral, resp. rate 14, SpO2 97.00%.  GEN:  Crying intermittently, screaming intermittently, lying in the stretcher in no acute distress; cooperative with exam. PSYCH:  alert and oriented  x4; does not appear anxious or depressed; affect is appropriate. HEENT: Mucous membranes pink and anicteric; PERRLA; EOM intact; no cervical lymphadenopathy nor thyromegaly or carotid bruit; no JVD; There were no stridor. Neck is very supple. Breasts:: Not examined CHEST WALL: No tenderness CHEST: Normal respiration, clear to auscultation bilaterally.  HEART: Regular rate and rhythm.  There are no murmur, rub, or gallops.   BACK: No kyphosis or scoliosis; no CVA tenderness ABDOMEN: soft and non-tender; no masses, no organomegaly, normal abdominal bowel sounds; no pannus; no intertriginous candida. There is no rebound and no distention. Rectal Exam: Not done EXTREMITIES: No bone or joint deformity; age-appropriate arthropathy of the hands and knees; no edema; no ulcerations.  There is no calf tenderness. Genitalia: not examined PULSES: 2+ and symmetric SKIN: Normal hydration no rash or ulceration CNS: Cranial nerves 2-12 grossly intact no focal lateralizing neurologic deficit.  Speech is fluent; uvula elevated with phonation, facial symmetry and tongue midline. DTR are normal bilaterally,  cerebella exam is intact, barbinski is negative and strengths are equaled bilaterally.  No sensory loss.   Labs on Admission:  Basic Metabolic Panel:  Recent Labs Lab 04/01/13 2231  NA 133*  K 3.6  CL 97  CO2 23  GLUCOSE 203*  BUN 19  CREATININE 0.53  CALCIUM 9.7   Liver Function Tests:  Recent Labs Lab 04/01/13 2231  AST 57*  ALT 48*  ALKPHOS 80  BILITOT 0.6  PROT 7.9  ALBUMIN 3.9    Recent Labs Lab 04/01/13 2231  LIPASE 30  AMYLASE 211*   No results found for this basename: AMMONIA,  in the last 168 hours CBC:  Recent Labs Lab 04/01/13 2231  WBC 11.3*  NEUTROABS 7.3  HGB 11.9*  HCT 37.0  MCV 70.9*  PLT 185   Cardiac Enzymes: No results found for this basename: CKTOTAL, CKMB, CKMBINDEX, TROPONINI,  in the last 168 hours  CBG: No results found for this basename: GLUCAP,  in the last 168 hours   Radiological Exams on Admission: No results found.  Assessment/Plan Present on Admission:  . Abdominal pain . Bipolar 1 disorder . DKA (diabetic ketoacidoses) . Hep C w/o coma, chronic . Seizure disorder . Tobacco abuse . Nausea with vomiting  PLAN:  Will admit her for abdominal pain and symptomatic tx for her intractable nausea and vomiting.  Will obtain a abdominal pelvic CT.  I don't think this is a PUD symptoms.  She constantly asking for pain medication.  For her DM, she is not in DKA and will continue her meds.  Will give clear lquid and advance to carb modified as tolerated. She is stable, full code, and will be admitted to [facility] service.  She has trichomonas in the urine, and will be treated with IV Flagyl.  She was given IM Rocephin 250x1 to cover GC as well.  She was told she has an STD infection.  Thank you for allowing me to partake in the care of this patient.  Other plans as per orders.  Code Status: FULL Unk Lightning, MD. [facility] Pager [phone] 7pm to 7am.  04/02/2013, 4:38 AM

## 2013-04-02 NOTE — Progress Notes (Signed)
Patient discharged to home. Discharge instructions given . No concerns voiced. Left unit in wheelchair pushed by nurse tech to bus stop. Left in good condition. No concerns voiced. Vwilliams,rn.

## 2013-04-02 NOTE — Progress Notes (Signed)
Report called to [place], Charity fundraiser.

## 2013-04-04 ENCOUNTER — Emergency Department (HOSPITAL_COMMUNITY): Payer: No Typology Code available for payment source

## 2013-04-04 ENCOUNTER — Encounter (HOSPITAL_COMMUNITY): Payer: Self-pay | Admitting: Emergency Medicine

## 2013-04-04 ENCOUNTER — Emergency Department (HOSPITAL_COMMUNITY)
Admission: EM | Admit: 2013-04-04 | Discharge: 2013-04-04 | Disposition: A | Discharge status: Home / Self Care | Payer: No Typology Code available for payment source | Attending: Emergency Medicine | Admitting: Emergency Medicine

## 2013-04-04 DIAGNOSIS — Z87442 Personal history of urinary calculi: Secondary | ICD-10-CM | POA: Insufficient documentation

## 2013-04-04 DIAGNOSIS — Z8619 Personal history of other infectious and parasitic diseases: Secondary | ICD-10-CM | POA: Insufficient documentation

## 2013-04-04 DIAGNOSIS — Z862 Personal history of diseases of the blood and blood-forming organs and certain disorders involving the immune mechanism: Secondary | ICD-10-CM | POA: Insufficient documentation

## 2013-04-04 DIAGNOSIS — F4310 Post-traumatic stress disorder, unspecified: Secondary | ICD-10-CM | POA: Insufficient documentation

## 2013-04-04 DIAGNOSIS — Z9089 Acquired absence of other organs: Secondary | ICD-10-CM | POA: Insufficient documentation

## 2013-04-04 DIAGNOSIS — R011 Cardiac murmur, unspecified: Secondary | ICD-10-CM | POA: Insufficient documentation

## 2013-04-04 DIAGNOSIS — Z7982 Long term (current) use of aspirin: Secondary | ICD-10-CM | POA: Insufficient documentation

## 2013-04-04 DIAGNOSIS — F17200 Nicotine dependence, unspecified, uncomplicated: Secondary | ICD-10-CM | POA: Insufficient documentation

## 2013-04-04 DIAGNOSIS — Z8719 Personal history of other diseases of the digestive system: Secondary | ICD-10-CM | POA: Insufficient documentation

## 2013-04-04 DIAGNOSIS — Z794 Long term (current) use of insulin: Secondary | ICD-10-CM | POA: Insufficient documentation

## 2013-04-04 DIAGNOSIS — R109 Unspecified abdominal pain: Secondary | ICD-10-CM | POA: Insufficient documentation

## 2013-04-04 DIAGNOSIS — Z880 Allergy status to penicillin: Secondary | ICD-10-CM | POA: Insufficient documentation

## 2013-04-04 DIAGNOSIS — E119 Type 2 diabetes mellitus without complications: Secondary | ICD-10-CM | POA: Insufficient documentation

## 2013-04-04 DIAGNOSIS — G909 Disorder of the autonomic nervous system, unspecified: Secondary | ICD-10-CM | POA: Insufficient documentation

## 2013-04-04 DIAGNOSIS — Z79899 Other long term (current) drug therapy: Secondary | ICD-10-CM | POA: Insufficient documentation

## 2013-04-04 DIAGNOSIS — F319 Bipolar disorder, unspecified: Secondary | ICD-10-CM | POA: Insufficient documentation

## 2013-04-04 DIAGNOSIS — M129 Arthropathy, unspecified: Secondary | ICD-10-CM | POA: Insufficient documentation

## 2013-04-04 DIAGNOSIS — G40909 Epilepsy, unspecified, not intractable, without status epilepticus: Secondary | ICD-10-CM | POA: Insufficient documentation

## 2013-04-04 DIAGNOSIS — Z791 Long term (current) use of non-steroidal anti-inflammatories (NSAID): Secondary | ICD-10-CM | POA: Insufficient documentation

## 2013-04-04 LAB — CBC WITH DIFFERENTIAL/PLATELET
Eosinophils Absolute: 0 10*3/uL (ref 0.0–0.7)
Eosinophils Relative: 0 % (ref 0–5)
Lymphs Abs: 3.1 10*3/uL (ref 0.7–4.0)
MCH: 22.8 pg — ABNORMAL LOW (ref 26.0–34.0)
MCHC: 32.2 g/dL (ref 30.0–36.0)
MCV: 70.8 fL — ABNORMAL LOW (ref 78.0–100.0)
Monocytes Absolute: 0.2 10*3/uL (ref 0.1–1.0)
Neutro Abs: 8.7 10*3/uL — ABNORMAL HIGH (ref 1.7–7.7)
Neutrophils Relative %: 72 % (ref 43–77)
Platelets: 164 10*3/uL (ref 150–400)
RBC: 5.18 MIL/uL — ABNORMAL HIGH (ref 3.87–5.11)
RDW: 14.7 % (ref 11.5–15.5)

## 2013-04-04 LAB — COMPREHENSIVE METABOLIC PANEL
ALT: 58 U/L — ABNORMAL HIGH (ref 0–35)
AST: 67 U/L — ABNORMAL HIGH (ref 0–37)
Alkaline Phosphatase: 69 U/L (ref 39–117)
CO2: 23 mEq/L (ref 19–32)
Calcium: 9.2 mg/dL (ref 8.4–10.5)
Creatinine, Ser: 0.45 mg/dL — ABNORMAL LOW (ref 0.50–1.10)
GFR calc Af Amer: >90 mL/min (ref >90)
GFR calc non Af Amer: >90 mL/min (ref >90)
Potassium: 3.5 mEq/L (ref 3.5–5.1)
Sodium: 137 mEq/L (ref 135–145)
Total Bilirubin: 0.2 mg/dL — ABNORMAL LOW (ref 0.3–1.2)
Total Protein: 6.9 g/dL (ref 6.0–8.3)

## 2013-04-04 LAB — URINALYSIS, ROUTINE W REFLEX MICROSCOPIC
Bilirubin Urine: NEGATIVE
Leukocytes, UA: NEGATIVE
Nitrite: NEGATIVE
Protein, ur: NEGATIVE mg/dL
Specific Gravity, Urine: 1.026 (ref 1.005–1.030)
pH: 6.5 (ref 5.0–8.0)

## 2013-04-04 LAB — URINE MICROSCOPIC-ADD ON

## 2013-04-04 LAB — HCG, SERUM, QUALITATIVE: Preg, Serum: NEGATIVE

## 2013-04-04 MED ORDER — KETOROLAC TROMETHAMINE 30 MG/ML IJ SOLN
30.0000 mg | Freq: Once | INTRAMUSCULAR | Status: AC
  Administered 2013-04-04: 30 mg via INTRAVENOUS
  Filled 2013-04-04: qty 1

## 2013-04-04 MED ORDER — HYDROCODONE-ACETAMINOPHEN 5-325 MG PO TABS: 1.0000 | ORAL_TABLET | ORAL | Status: DC | PRN

## 2013-04-04 MED ORDER — SODIUM CHLORIDE 0.9 % IV BOLUS (SEPSIS)
1000.0000 mL | Freq: Once | INTRAVENOUS | Status: AC
  Administered 2013-04-04: 1000 mL via INTRAVENOUS

## 2013-04-04 MED ORDER — ONDANSETRON HCL 4 MG/2ML IJ SOLN
4.0000 mg | Freq: Once | INTRAMUSCULAR | Status: AC
  Administered 2013-04-04: 4 mg via INTRAVENOUS
  Filled 2013-04-04: qty 2

## 2013-04-04 MED ORDER — ONDANSETRON 8 MG PO TBDP: 8.0000 mg | ORAL_TABLET | Freq: Three times a day (TID) | ORAL | Status: DC | PRN

## 2013-04-04 MED ORDER — HYDROMORPHONE HCL PF 1 MG/ML IJ SOLN
1.0000 mg | Freq: Once | INTRAMUSCULAR | Status: AC
  Administered 2013-04-04: 1 mg via INTRAVENOUS
  Filled 2013-04-04: qty 1

## 2013-04-04 MED ORDER — LORAZEPAM 2 MG/ML IJ SOLN
1.0000 mg | Freq: Once | INTRAMUSCULAR | Status: AC
  Administered 2013-04-04: 1 mg via INTRAVENOUS
  Filled 2013-04-04: qty 1

## 2013-04-04 NOTE — ED Notes (Signed)
Per EMS: pt c/o of right sided abd pain, was just here yesterday for same symptoms. C/o n/v, vomited a brownish-green color.

## 2013-04-04 NOTE — ED Notes (Signed)
Bed: WA12 Expected date:  Expected time:  Means of arrival:  Comments: abd pain 

## 2013-04-04 NOTE — ED Provider Notes (Signed)
CSN: [id number]     Arrival date & time 04/04/13  1748 History   First MD Initiated Contact with Patient 04/04/13 1751     Chief Complaint  Patient presents with  . Abdominal Pain     HPI Patient presents with right-sided abdominal pain that has been present over the past several days.  She states she was recently hospitalized for this.  She states she's been having nausea and vomiting.  She denies diarrhea.  No fevers or chills.  She has a known history of gallstones and has scheduled followup with the general surgeons but has not had it yet.  She's had a CT scan recently that demonstrated no acute pathology except for the cholelithiasis.  Her pain is moderate to severe in severity.  She reports mild decreased oral intake today.   Past Medical History  Diagnosis Date  . Diabetes mellitus     diagnosed in 1996-always been on insulin  . Kidney stone   . Seizure disorder     started with pregnancy of first son  . Gastritis   . Bipolar 2 disorder   . Post traumatic stress disorder     "flipping out" after people close to her died  . DKA (diabetic ketoacidoses)     Recurrent admissions for DKA, medication non-complaince, poor social situation  . Heart murmur   . Arthritis     r ankle-S/P surgery 11/13/05)  . Peripheral vascular disease     numbness bilaterally feet  . Anemia     childhood  . Depression     childhood  . Hepatitis C     biopsy in 2010-no rx-was supposed to see a hepatologist in [place].  With cirrhosis  . Neuropathy     in legs and feet and hands   Past Surgical History  Procedure Laterality Date  . Ankle surgery    . Cesarean section    . Endometrial biopsy  06/22/2012   Family History  Problem Relation Age of Onset  . Diabetes Mother     currently 33  . Fibromyalgia Mother   . Cirrhosis Father     died in 13-Nov-1997   History  Substance Use Topics  . Smoking status: Current Every Day Smoker -- 0.10 packs/day for 25 years    Types: Cigarettes  .  Smokeless tobacco: Never Used  . Alcohol Use: No     Comment: Endorses hasn't been drinking   OB History   Grav Para Term Preterm Abortions TAB SAB Ect Mult Living   4 3 3  0 1 0 1 0 1 2     Review of Systems  All other systems reviewed and are negative.    Allergies  Penicillins  Home Medications   Current Outpatient Rx  Name  Route  Sig  Dispense  Refill  . aspirin EC 81 MG tablet   Oral   Take 81 mg by mouth daily.          . calcium carbonate (TUMS - DOSED IN MG ELEMENTAL CALCIUM) 500 MG chewable tablet   Oral   Chew 2 tablets by mouth daily as needed. For heartburn         . gabapentin (NEURONTIN) 400 MG capsule   Oral   Take 400 mg by mouth 3 (three) times daily.         Marland Kitchen glipiZIDE (GLUCOTROL) 5 MG tablet   Oral   Take 5 mg by mouth 2 (two) times daily.         Marland Kitchen  hydrochlorothiazide (MICROZIDE) 12.5 MG capsule   Oral   Take 12.5 mg by mouth daily.         . insulin glargine (LANTUS) 100 UNIT/ML injection   Subcutaneous   Inject 50 Units into the skin at bedtime.         . medroxyPROGESTERone (PROVERA) 10 MG tablet   Oral   Take 1 tablet (10 mg total) by mouth daily.   10 tablet   0   . meloxicam (MOBIC) 15 MG tablet   Oral   Take 15 mg by mouth daily.          . metFORMIN (GLUCOPHAGE) 1000 MG tablet   Oral   Take 1,000 mg by mouth 2 (two) times daily.         . metoCLOPramide (REGLAN) 10 MG tablet   Oral   Take 10 mg by mouth 3 (three) times daily before meals.         . Multiple Vitamin (MULTIVITAMIN WITH MINERALS) TABS tablet   Oral   Take 1 tablet by mouth daily.         . ranitidine (ZANTAC) 150 MG tablet   Oral   Take 150 mg by mouth 2 (two) times daily.         . risperidone (RISPERDAL) 4 MG tablet   Oral   Take 2 mg by mouth at bedtime.          . sitaGLIPtin (JANUVIA) 100 MG tablet   Oral   Take 100 mg by mouth daily.         Marland Kitchen HYDROcodone-acetaminophen (NORCO/VICODIN) 5-325 MG per tablet   Oral    Take 1 tablet by mouth every 4 (four) hours as needed for pain.   8 tablet   0   . ondansetron (ZOFRAN ODT) 8 MG disintegrating tablet   Oral   Take 1 tablet (8 mg total) by mouth every 8 (eight) hours as needed for nausea.   10 tablet   0    BP 121/73  Pulse 60  Temp(Src) 98.7 F (37.1 C) (Oral)  Resp 18  SpO2 95% Physical Exam  Nursing note and vitals reviewed. Constitutional: She is oriented to person, place, and time. She appears well-developed and well-nourished. No distress.  HENT:  Head: Normocephalic and atraumatic.  Eyes: EOM are normal.  Neck: Normal range of motion.  Cardiovascular: Normal rate, regular rhythm and normal heart sounds.   Pulmonary/Chest: Effort normal and breath sounds normal.  Abdominal: Soft. She exhibits no distension.  Mild generalized upper abdominal tenderness without guarding or rebound.  Musculoskeletal: Normal range of motion.  Neurological: She is alert and oriented to person, place, and time.  Skin: Skin is warm and dry.  Psychiatric: She has a normal mood and affect. Judgment normal.    ED Course  Procedures (including critical care time) Labs Review Labs Reviewed  CBC WITH DIFFERENTIAL - Abnormal; Notable for the following:    WBC 12.0 (*)    RBC 5.18 (*)    Hemoglobin 11.8 (*)    MCV 70.8 (*)    MCH 22.8 (*)    Monocytes Relative 2 (*)    Neutro Abs 8.7 (*)    All other components within normal limits  COMPREHENSIVE METABOLIC PANEL - Abnormal; Notable for the following:    Glucose, Bld 117 (*)    Creatinine, Ser 0.45 (*)    AST 67 (*)    ALT 58 (*)    Total Bilirubin 0.2 (*)  All other components within normal limits  URINALYSIS, ROUTINE W REFLEX MICROSCOPIC - Abnormal; Notable for the following:    Hgb urine dipstick LARGE (*)    All other components within normal limits  LIPASE, BLOOD  HCG, SERUM, QUALITATIVE  URINE MICROSCOPIC-ADD ON   Imaging Review US Abdomen Complete  04/04/2013   CLINICAL DATA:   Abdominal pain with nausea. History of cholelithiasis, diabetes and hepatitis-C.  EXAM: ULTRASOUND ABDOMEN COMPLETE  COMPARISON:  Abdominal CT 04/02/2013.  FINDINGS: Gallbladder  Small gallstones are noted. There is no gallbladder wall thickening or pericholecystic fluid. The sonographic Eulah Pont sign is absent.  Common bile duct  Diameter: 3.2 mm. No intraductal calculi seen.  Liver  No focal lesion identified. Within normal limits in parenchymal echogenicity.  IVC  No abnormality visualized.  Pancreas  Visualized portion unremarkable.  Spleen  Size and appearance within normal limits.  Right Kidney  Length: 11.0 cm. Echogenicity within normal limits. No mass or hydronephrosis visualized.  Left Kidney  Length: 11.0 cm. Echogenicity within normal limits. No mass or hydronephrosis visualized.  Abdominal aorta  No aneurysm visualized.  IMPRESSION: 1. Cholelithiasis without evidence of cholecystitis or biliary dilatation. 2. No other significant findings.   Electronically Signed   By: Roxy Horseman M.D.   On: 04/04/2013 19:43    EKG Interpretation   None       MDM   1. Abdominal pain    8:47 PM Patient feels better at this time.  Discharge home.  Patient has scheduled followup with general surgery regarding her cholelithiasis.  This may represent biliary colic.  Home with pain medicine and nausea medicine.    Lyanne Co, MD 04/04/13 2053

## 2013-04-06 ENCOUNTER — Encounter (HOSPITAL_COMMUNITY): Payer: Self-pay | Admitting: Emergency Medicine

## 2013-04-06 ENCOUNTER — Emergency Department (HOSPITAL_COMMUNITY)
Admission: EM | Admit: 2013-04-06 | Discharge: 2013-04-06 | Disposition: A | Discharge status: Home / Self Care | Payer: No Typology Code available for payment source | Attending: Emergency Medicine | Admitting: Emergency Medicine

## 2013-04-06 DIAGNOSIS — R109 Unspecified abdominal pain: Secondary | ICD-10-CM

## 2013-04-06 DIAGNOSIS — Z79899 Other long term (current) drug therapy: Secondary | ICD-10-CM | POA: Insufficient documentation

## 2013-04-06 DIAGNOSIS — E1142 Type 2 diabetes mellitus with diabetic polyneuropathy: Secondary | ICD-10-CM | POA: Insufficient documentation

## 2013-04-06 DIAGNOSIS — Z794 Long term (current) use of insulin: Secondary | ICD-10-CM | POA: Insufficient documentation

## 2013-04-06 DIAGNOSIS — Z8619 Personal history of other infectious and parasitic diseases: Secondary | ICD-10-CM | POA: Insufficient documentation

## 2013-04-06 DIAGNOSIS — R112 Nausea with vomiting, unspecified: Secondary | ICD-10-CM | POA: Insufficient documentation

## 2013-04-06 DIAGNOSIS — Z87442 Personal history of urinary calculi: Secondary | ICD-10-CM | POA: Insufficient documentation

## 2013-04-06 DIAGNOSIS — E1110 Type 2 diabetes mellitus with ketoacidosis without coma: Secondary | ICD-10-CM | POA: Insufficient documentation

## 2013-04-06 DIAGNOSIS — E119 Type 2 diabetes mellitus without complications: Secondary | ICD-10-CM | POA: Insufficient documentation

## 2013-04-06 DIAGNOSIS — Z7982 Long term (current) use of aspirin: Secondary | ICD-10-CM | POA: Insufficient documentation

## 2013-04-06 DIAGNOSIS — F17200 Nicotine dependence, unspecified, uncomplicated: Secondary | ICD-10-CM | POA: Insufficient documentation

## 2013-04-06 DIAGNOSIS — Z3202 Encounter for pregnancy test, result negative: Secondary | ICD-10-CM | POA: Insufficient documentation

## 2013-04-06 DIAGNOSIS — Z880 Allergy status to penicillin: Secondary | ICD-10-CM | POA: Insufficient documentation

## 2013-04-06 DIAGNOSIS — F3189 Other bipolar disorder: Secondary | ICD-10-CM | POA: Insufficient documentation

## 2013-04-06 DIAGNOSIS — R011 Cardiac murmur, unspecified: Secondary | ICD-10-CM | POA: Insufficient documentation

## 2013-04-06 DIAGNOSIS — R1011 Right upper quadrant pain: Secondary | ICD-10-CM | POA: Insufficient documentation

## 2013-04-06 DIAGNOSIS — Z862 Personal history of diseases of the blood and blood-forming organs and certain disorders involving the immune mechanism: Secondary | ICD-10-CM | POA: Insufficient documentation

## 2013-04-06 DIAGNOSIS — M129 Arthropathy, unspecified: Secondary | ICD-10-CM | POA: Insufficient documentation

## 2013-04-06 DIAGNOSIS — F4310 Post-traumatic stress disorder, unspecified: Secondary | ICD-10-CM | POA: Insufficient documentation

## 2013-04-06 DIAGNOSIS — G40909 Epilepsy, unspecified, not intractable, without status epilepticus: Secondary | ICD-10-CM | POA: Insufficient documentation

## 2013-04-06 DIAGNOSIS — Z8719 Personal history of other diseases of the digestive system: Secondary | ICD-10-CM | POA: Insufficient documentation

## 2013-04-06 LAB — CBC
MCH: 22.9 pg — ABNORMAL LOW (ref 26.0–34.0)
MCHC: 32.5 g/dL (ref 30.0–36.0)
MCV: 70.7 fL — ABNORMAL LOW (ref 78.0–100.0)
Platelets: 154 10*3/uL (ref 150–400)
RDW: 14.6 % (ref 11.5–15.5)
WBC: 7.4 10*3/uL (ref 4.0–10.5)

## 2013-04-06 LAB — URINE MICROSCOPIC-ADD ON

## 2013-04-06 LAB — COMPREHENSIVE METABOLIC PANEL
ALT: 48 U/L — ABNORMAL HIGH (ref 0–35)
AST: 48 U/L — ABNORMAL HIGH (ref 0–37)
Albumin: 3.4 g/dL — ABNORMAL LOW (ref 3.5–5.2)
BUN: 12 mg/dL (ref 6–23)
Calcium: 9.1 mg/dL (ref 8.4–10.5)
Chloride: 101 mEq/L (ref 96–112)
Creatinine, Ser: 0.52 mg/dL (ref 0.50–1.10)
Total Bilirubin: 0.3 mg/dL (ref 0.3–1.2)
Total Protein: 6.8 g/dL (ref 6.0–8.3)

## 2013-04-06 LAB — URINALYSIS, ROUTINE W REFLEX MICROSCOPIC
Bilirubin Urine: NEGATIVE
Ketones, ur: 40 mg/dL — AB
Protein, ur: NEGATIVE mg/dL
Urobilinogen, UA: 1 mg/dL (ref 0.0–1.0)

## 2013-04-06 LAB — LIPASE, BLOOD: Lipase: 22 U/L (ref 11–59)

## 2013-04-06 LAB — GLUCOSE, CAPILLARY: Glucose-Capillary: 221 mg/dL — ABNORMAL HIGH (ref 70–99)

## 2013-04-06 MED ORDER — SODIUM CHLORIDE 0.9 % IV SOLN
Freq: Once | INTRAVENOUS | Status: AC
  Administered 2013-04-06: 10:00:00 via INTRAVENOUS

## 2013-04-06 MED ORDER — ONDANSETRON HCL 4 MG/2ML IJ SOLN
4.0000 mg | Freq: Once | INTRAMUSCULAR | Status: AC
  Administered 2013-04-06: 4 mg via INTRAVENOUS
  Filled 2013-04-06: qty 2

## 2013-04-06 MED ORDER — KETOROLAC TROMETHAMINE 30 MG/ML IJ SOLN
30.0000 mg | Freq: Once | INTRAMUSCULAR | Status: AC
  Administered 2013-04-06: 30 mg via INTRAVENOUS
  Filled 2013-04-06: qty 1

## 2013-04-06 MED ORDER — HYDROCODONE-ACETAMINOPHEN 5-325 MG PO TABS: 2.0000 | ORAL_TABLET | ORAL | Status: DC | PRN

## 2013-04-06 NOTE — ED Provider Notes (Signed)
Medical screening examination/treatment/procedure(s) were performed by non-physician practitioner and as supervising physician I was immediately available for consultation/collaboration.  EKG Interpretation     Ventricular Rate:    PR Interval:    QRS Duration:   QT Interval:    QTC Calculation:   R Axis:     Text Interpretation:                Benny Lennert, MD 04/06/13 2114

## 2013-04-06 NOTE — Progress Notes (Signed)
[facility] CL spoke with patient about her [facility] [employer]. Patient confirmed her PCP was [facility]. Patient declined my offer to set her up with a follow-up apt, stating that she would make the apt. Then pt stated that she already had an apt.

## 2013-04-06 NOTE — ED Provider Notes (Signed)
Medical screening examination/treatment/procedure(s) were performed by non-physician practitioner and as supervising physician I was immediately available for consultation/collaboration.  Flint Melter, MD 04/06/13 [phone]

## 2013-04-06 NOTE — ED Notes (Addendum)
Per [facility] pt from home c/o RUQ abdominal pain onset this a.m. 5. She states pain is constant but feels better when she presses it. She took 2 ibuprofen but vomited them up. Pt had 4 episodes of vomiting.

## 2013-04-06 NOTE — ED Notes (Signed)
Phlebotomy made aware of the need for lab redraw for CMP.

## 2013-04-06 NOTE — ED Provider Notes (Signed)
CSN: [id number]     Arrival date & time 04/06/13  4098 History   First MD Initiated Contact with Patient 04/06/13 [phone]     Chief Complaint  Patient presents with  . Abdominal Pain   (Consider location/radiation/quality/duration/timing/severity/associated sxs/prior Treatment) HPI Comments: Mary [name] is an 42 y.o. female with a history of abdominal pain, nausea and vomiting, hx of DM2, Bipolar disorder, chronic hepatitis, presents to the Emergency Department for RUQ abdominal pain since 0400 today. She reports the pain is 10+/10 sharp pain that radiates to right flank and is not associated with oral intake. Reports nausea and 4 episodes of vomiting today, denies blood in emesis or coffee ground emesis. Denies diarrhea, constipation, fever, chills, black or bloody stools, hematuria, or dysuria. Denies recent ETOH use. LNMP: 5 days ago.  Patient is a 43 y.o. female presenting with abdominal pain. The history is provided by the patient.  Abdominal Pain   Past Medical History  Diagnosis Date  . Diabetes mellitus     diagnosed in 1996-always been on insulin  . Kidney stone   . Seizure disorder     started with pregnancy of first son  . Gastritis   . Bipolar 2 disorder   . Post traumatic stress disorder     "flipping out" after people close to her died  . DKA (diabetic ketoacidoses)     Recurrent admissions for DKA, medication non-complaince, poor social situation  . Heart murmur   . Arthritis     r ankle-S/P surgery 11-03-2005)  . Peripheral vascular disease     numbness bilaterally feet  . Anemia     childhood  . Depression     childhood  . Hepatitis C     biopsy in 2010-no rx-was supposed to see a hepatologist in [place].  With cirrhosis  . Neuropathy     in legs and feet and hands   Past Surgical History  Procedure Laterality Date  . Ankle surgery    . Cesarean section    . Endometrial biopsy  06/22/2012   Family History  Problem Relation Age of Onset  . Diabetes  Mother     currently 78  . Fibromyalgia Mother   . Cirrhosis Father     died in 03-Nov-1997   History  Substance Use Topics  . Smoking status: Current Every Day Smoker -- 0.10 packs/day for 25 years    Types: Cigarettes  . Smokeless tobacco: Never Used  . Alcohol Use: No     Comment: Endorses hasn't been drinking   OB History   Grav Para Term Preterm Abortions TAB SAB Ect Mult Living   4 3 3  0 1 0 1 0 1 2     Review of Systems  Gastrointestinal: Positive for abdominal pain.    Allergies  Penicillins  Home Medications   Current Outpatient Rx  Name  Route  Sig  Dispense  Refill  . aspirin EC 81 MG tablet   Oral   Take 81 mg by mouth daily.          . calcium carbonate (TUMS - DOSED IN MG ELEMENTAL CALCIUM) 500 MG chewable tablet   Oral   Chew 2 tablets by mouth daily as needed. For heartburn         . gabapentin (NEURONTIN) 400 MG capsule   Oral   Take 400 mg by mouth 3 (three) times daily.         Marland Kitchen glipiZIDE (GLUCOTROL) 5 MG tablet  Oral   Take 5 mg by mouth 2 (two) times daily.         . hydrochlorothiazide (MICROZIDE) 12.5 MG capsule   Oral   Take 12.5 mg by mouth daily.         . insulin glargine (LANTUS) 100 UNIT/ML injection   Subcutaneous   Inject 50 Units into the skin at bedtime.         . medroxyPROGESTERone (PROVERA) 10 MG tablet   Oral   Take 1 tablet (10 mg total) by mouth daily.   10 tablet   0   . meloxicam (MOBIC) 15 MG tablet   Oral   Take 15 mg by mouth daily.          . metFORMIN (GLUCOPHAGE) 1000 MG tablet   Oral   Take 1,000 mg by mouth 2 (two) times daily.         . metoCLOPramide (REGLAN) 10 MG tablet   Oral   Take 10 mg by mouth 3 (three) times daily before meals.         . Multiple Vitamin (MULTIVITAMIN WITH MINERALS) TABS tablet   Oral   Take 1 tablet by mouth daily.         . ranitidine (ZANTAC) 150 MG tablet   Oral   Take 150 mg by mouth 2 (two) times daily.         . risperidone (RISPERDAL) 4  MG tablet   Oral   Take 2 mg by mouth at bedtime.          . sitaGLIPtin (JANUVIA) 100 MG tablet   Oral   Take 100 mg by mouth daily.         Marland Kitchen HYDROcodone-acetaminophen (NORCO/VICODIN) 5-325 MG per tablet   Oral   Take 1 tablet by mouth every 4 (four) hours as needed for pain.   8 tablet   0   . HYDROcodone-acetaminophen (NORCO/VICODIN) 5-325 MG per tablet   Oral   Take 2 tablets by mouth every 4 (four) hours as needed for pain.   4 tablet   0   . ondansetron (ZOFRAN ODT) 8 MG disintegrating tablet   Oral   Take 1 tablet (8 mg total) by mouth every 8 (eight) hours as needed for nausea.   10 tablet   0    BP 146/79  Pulse 53  Temp(Src) 97.9 F (36.6 C) (Oral)  Resp 16  SpO2 100%  LMP 04/06/2013 Physical Exam  Nursing note and vitals reviewed. Constitutional: She is oriented to person, place, and time. She appears well-developed and well-nourished.  Patient is rocking back and forth and moaning during assessment.  HENT:  Head: Normocephalic and atraumatic.  Eyes: EOM are normal. Pupils are equal, round, and reactive to light. No scleral icterus.  Neck: Neck supple.  Cardiovascular: Normal rate, regular rhythm and normal heart sounds.   No murmur heard. Pulmonary/Chest: Effort normal and breath sounds normal. She has no wheezes. She has no rales.  Abdominal: Soft. Normal appearance and bowel sounds are normal. There is tenderness in the right upper quadrant and epigastric area. There is no rigidity, no rebound, no guarding, no CVA tenderness and negative Murphy's sign.  Musculoskeletal: Normal range of motion. She exhibits no edema.  Neurological: She is alert and oriented to person, place, and time.  Skin: Skin is warm and dry. No rash noted.    ED Course  Procedures (including critical care time) Labs Review Labs Reviewed  URINALYSIS, ROUTINE W REFLEX  MICROSCOPIC - Abnormal; Notable for the following:    APPearance CLOUDY (*)    Glucose, UA >1000 (*)     Hgb urine dipstick LARGE (*)    Ketones, ur 40 (*)    All other components within normal limits  CBC - Abnormal; Notable for the following:    RBC 5.36 (*)    MCV 70.7 (*)    MCH 22.9 (*)    All other components within normal limits  COMPREHENSIVE METABOLIC PANEL - Abnormal; Notable for the following:    Glucose, Bld 256 (*)    Albumin 3.4 (*)    AST 48 (*)    ALT 48 (*)    All other components within normal limits  GLUCOSE, CAPILLARY - Abnormal; Notable for the following:    Glucose-Capillary 221 (*)    All other components within normal limits  PREGNANCY, URINE  LIPASE, BLOOD  URINE MICROSCOPIC-ADD ON   Imaging Review US Abdomen Complete  04/04/2013   CLINICAL DATA:  Abdominal pain with nausea. History of cholelithiasis, diabetes and hepatitis-C.  EXAM: ULTRASOUND ABDOMEN COMPLETE  COMPARISON:  Abdominal CT 04/02/2013.  FINDINGS: Gallbladder  Small gallstones are noted. There is no gallbladder wall thickening or pericholecystic fluid. The sonographic Eulah Pont sign is absent.  Common bile duct  Diameter: 3.2 mm. No intraductal calculi seen.  Liver  No focal lesion identified. Within normal limits in parenchymal echogenicity.  IVC  No abnormality visualized.  Pancreas  Visualized portion unremarkable.  Spleen  Size and appearance within normal limits.  Right Kidney  Length: 11.0 cm. Echogenicity within normal limits. No mass or hydronephrosis visualized.  Left Kidney  Length: 11.0 cm. Echogenicity within normal limits. No mass or hydronephrosis visualized.  Abdominal aorta  No aneurysm visualized.  IMPRESSION: 1. Cholelithiasis without evidence of cholecystitis or biliary dilatation. 2. No other significant findings.   Electronically Signed   By: Roxy Horseman M.D.   On: 04/04/2013 19:43    EKG Interpretation   None       MDM   1. Abdominal pain     Patient with a history of Cholelithiasis presents with RUQ pain and negative murphy's sign.  Pt has had multiple visits the the ED  with similar complains. Most recent imaging and lab workup 04/03/2013. Will order bilirubin for possible obstruction, lipase for pancreatitis, and UA for pregnancy, infection and stone. Toradol for pain  UA-Glucose >1000, with ketones. CBG to assess BG with history of DKA  CBG 221, patient reports taking her medication this morning.  Continue fluids.  Patient reports decrease in pain and nausea Toradol.  Patient reports that she has an appointment with [facility] and advised patient to keep appointment and to follow up with them for treatment of cholelithiasis     Meds given in ED:  Medications  0.9 %  sodium chloride infusion ( Intravenous Stopped 04/06/13 1317)  ondansetron (ZOFRAN) injection 4 mg (4 mg Intravenous Given 04/06/13 1024)  ketorolac (TORADOL) 30 MG/ML injection 30 mg (30 mg Intravenous Given 04/06/13 1024)  ketorolac (TORADOL) 30 MG/ML injection 30 mg (30 mg Intravenous Given 04/06/13 1252)    Discharge Medication List as of 04/06/2013  1:23 PM    START taking these medications   Details  !! HYDROcodone-acetaminophen (NORCO/VICODIN) 5-325 MG per tablet Take 2 tablets by mouth every 4 (four) hours as needed for pain., Starting 04/06/2013, Until Discontinued, Print     !! - Potential duplicate medications found. Please discuss with provider.  Clabe Seal, PA-C 04/06/13 1341

## 2013-04-06 NOTE — ED Notes (Signed)
Pt given a Diet Coke and cup of ice.

## 2013-04-06 NOTE — ED Notes (Addendum)
Unsuccessful IV attempt x 2 by Para March, RN.  This RN asked to attempt and did not see any sites to attempt.  Paged IV team.  PA notified.

## 2013-04-06 NOTE — ED Notes (Signed)
PA at bedside.

## 2013-04-07 ENCOUNTER — Observation Stay (HOSPITAL_COMMUNITY): Payer: Medicaid Other | Admitting: Anesthesiology

## 2013-04-07 ENCOUNTER — Observation Stay (HOSPITAL_COMMUNITY)
Admission: EM | Admit: 2013-04-07 | Discharge: 2013-04-08 | Disposition: A | Discharge status: Home / Self Care | Payer: Medicaid Other | Attending: General Surgery | Admitting: General Surgery

## 2013-04-07 ENCOUNTER — Encounter (HOSPITAL_COMMUNITY): Payer: Medicaid Other | Admitting: Anesthesiology

## 2013-04-07 ENCOUNTER — Observation Stay (HOSPITAL_COMMUNITY): Payer: Medicaid Other

## 2013-04-07 ENCOUNTER — Ambulatory Visit (INDEPENDENT_AMBULATORY_CARE_PROVIDER_SITE_OTHER): Payer: Self-pay | Admitting: General Surgery

## 2013-04-07 ENCOUNTER — Encounter (HOSPITAL_COMMUNITY): Payer: Self-pay | Admitting: Emergency Medicine

## 2013-04-07 ENCOUNTER — Encounter (HOSPITAL_COMMUNITY)
Admission: EM | Disposition: A | Discharge status: Home / Self Care | Payer: Self-pay | Source: Home / Self Care | Attending: Emergency Medicine

## 2013-04-07 DIAGNOSIS — F3189 Other bipolar disorder: Secondary | ICD-10-CM | POA: Insufficient documentation

## 2013-04-07 DIAGNOSIS — N938 Other specified abnormal uterine and vaginal bleeding: Secondary | ICD-10-CM | POA: Diagnosis present

## 2013-04-07 DIAGNOSIS — F1210 Cannabis abuse, uncomplicated: Secondary | ICD-10-CM | POA: Diagnosis not present

## 2013-04-07 DIAGNOSIS — F17200 Nicotine dependence, unspecified, uncomplicated: Secondary | ICD-10-CM | POA: Diagnosis not present

## 2013-04-07 DIAGNOSIS — R112 Nausea with vomiting, unspecified: Secondary | ICD-10-CM

## 2013-04-07 DIAGNOSIS — K3184 Gastroparesis: Secondary | ICD-10-CM | POA: Insufficient documentation

## 2013-04-07 DIAGNOSIS — Z79899 Other long term (current) drug therapy: Secondary | ICD-10-CM | POA: Insufficient documentation

## 2013-04-07 DIAGNOSIS — F4310 Post-traumatic stress disorder, unspecified: Secondary | ICD-10-CM | POA: Insufficient documentation

## 2013-04-07 DIAGNOSIS — R109 Unspecified abdominal pain: Secondary | ICD-10-CM

## 2013-04-07 DIAGNOSIS — K8010 Calculus of gallbladder with chronic cholecystitis without obstruction: Principal | ICD-10-CM | POA: Insufficient documentation

## 2013-04-07 DIAGNOSIS — E1149 Type 2 diabetes mellitus with other diabetic neurological complication: Secondary | ICD-10-CM | POA: Insufficient documentation

## 2013-04-07 DIAGNOSIS — E139 Other specified diabetes mellitus without complications: Secondary | ICD-10-CM | POA: Diagnosis present

## 2013-04-07 DIAGNOSIS — N949 Unspecified condition associated with female genital organs and menstrual cycle: Secondary | ICD-10-CM

## 2013-04-07 DIAGNOSIS — M549 Dorsalgia, unspecified: Secondary | ICD-10-CM | POA: Insufficient documentation

## 2013-04-07 DIAGNOSIS — B1920 Unspecified viral hepatitis C without hepatic coma: Secondary | ICD-10-CM | POA: Insufficient documentation

## 2013-04-07 DIAGNOSIS — K2970 Gastritis, unspecified, without bleeding: Secondary | ICD-10-CM | POA: Diagnosis not present

## 2013-04-07 DIAGNOSIS — E119 Type 2 diabetes mellitus without complications: Secondary | ICD-10-CM

## 2013-04-07 DIAGNOSIS — I739 Peripheral vascular disease, unspecified: Secondary | ICD-10-CM | POA: Insufficient documentation

## 2013-04-07 DIAGNOSIS — G40909 Epilepsy, unspecified, not intractable, without status epilepticus: Secondary | ICD-10-CM | POA: Diagnosis not present

## 2013-04-07 DIAGNOSIS — R1011 Right upper quadrant pain: Secondary | ICD-10-CM

## 2013-04-07 DIAGNOSIS — F3181 Bipolar II disorder: Secondary | ICD-10-CM | POA: Diagnosis present

## 2013-04-07 DIAGNOSIS — F411 Generalized anxiety disorder: Secondary | ICD-10-CM | POA: Diagnosis not present

## 2013-04-07 DIAGNOSIS — K8020 Calculus of gallbladder without cholecystitis without obstruction: Secondary | ICD-10-CM | POA: Diagnosis present

## 2013-04-07 DIAGNOSIS — M79609 Pain in unspecified limb: Secondary | ICD-10-CM | POA: Diagnosis not present

## 2013-04-07 DIAGNOSIS — Z794 Long term (current) use of insulin: Secondary | ICD-10-CM | POA: Insufficient documentation

## 2013-04-07 DIAGNOSIS — G8929 Other chronic pain: Secondary | ICD-10-CM | POA: Insufficient documentation

## 2013-04-07 DIAGNOSIS — E1142 Type 2 diabetes mellitus with diabetic polyneuropathy: Secondary | ICD-10-CM | POA: Diagnosis not present

## 2013-04-07 DIAGNOSIS — Z720 Tobacco use: Secondary | ICD-10-CM | POA: Diagnosis present

## 2013-04-07 HISTORY — DX: Unspecified cataract: H26.9

## 2013-04-07 HISTORY — PX: CHOLECYSTECTOMY: SHX55

## 2013-04-07 HISTORY — DX: Type 1 diabetes mellitus with diabetic neuropathy, unspecified: E10.40

## 2013-04-07 HISTORY — DX: Scoliosis, unspecified: M41.9

## 2013-04-07 LAB — PREGNANCY, URINE: Preg Test, Ur: NEGATIVE

## 2013-04-07 LAB — CBC WITH DIFFERENTIAL/PLATELET
Basophils Absolute: 0 10*3/uL (ref 0.0–0.1)
Basophils Relative: 0 % (ref 0–1)
Eosinophils Absolute: 0.1 10*3/uL (ref 0.0–0.7)
Eosinophils Relative: 1 % (ref 0–5)
HCT: 35.8 % — ABNORMAL LOW (ref 36.0–46.0)
Hemoglobin: 11.6 g/dL — ABNORMAL LOW (ref 12.0–15.0)
Lymphocytes Relative: 29 % (ref 12–46)
Lymphs Abs: 2.8 10*3/uL (ref 0.7–4.0)
MCH: 22.9 pg — ABNORMAL LOW (ref 26.0–34.0)
MCHC: 32.4 g/dL (ref 30.0–36.0)
Monocytes Absolute: 0.6 10*3/uL (ref 0.1–1.0)
Monocytes Relative: 6 % (ref 3–12)
Neutro Abs: 6.2 10*3/uL (ref 1.7–7.7)
Platelets: 226 10*3/uL (ref 150–400)
RDW: 14.5 % (ref 11.5–15.5)
WBC: 9.7 10*3/uL (ref 4.0–10.5)

## 2013-04-07 LAB — COMPREHENSIVE METABOLIC PANEL
AST: 53 U/L — ABNORMAL HIGH (ref 0–37)
Albumin: 3.8 g/dL (ref 3.5–5.2)
BUN: 13 mg/dL (ref 6–23)
Chloride: 95 mEq/L — ABNORMAL LOW (ref 96–112)
Creatinine, Ser: 0.6 mg/dL (ref 0.50–1.10)
Glucose, Bld: 236 mg/dL — ABNORMAL HIGH (ref 70–99)
Potassium: 3.7 mEq/L (ref 3.5–5.1)
Total Bilirubin: 0.4 mg/dL (ref 0.3–1.2)
Total Protein: 7.5 g/dL (ref 6.0–8.3)

## 2013-04-07 LAB — GLUCOSE, CAPILLARY
Glucose-Capillary: 166 mg/dL — ABNORMAL HIGH (ref 70–99)
Glucose-Capillary: 170 mg/dL — ABNORMAL HIGH (ref 70–99)

## 2013-04-07 LAB — HEMOGLOBIN A1C: Mean Plasma Glucose: 252 mg/dL — ABNORMAL HIGH (ref <117)

## 2013-04-07 LAB — CG4 I-STAT (LACTIC ACID): Lactic Acid, Venous: 1.58 mmol/L (ref 0.5–2.2)

## 2013-04-07 LAB — LIPASE, BLOOD: Lipase: 21 U/L (ref 11–59)

## 2013-04-07 SURGERY — LAPAROSCOPIC CHOLECYSTECTOMY WITH INTRAOPERATIVE CHOLANGIOGRAM
Anesthesia: General | Site: Abdomen | Wound class: Clean Contaminated

## 2013-04-07 MED ORDER — ASPIRIN EC 81 MG PO TBEC
81.0000 mg | DELAYED_RELEASE_TABLET | Freq: Every day | ORAL | Status: DC
  Administered 2013-04-08: 81 mg via ORAL
  Filled 2013-04-07: qty 1

## 2013-04-07 MED ORDER — SENNA 8.6 MG PO TABS
2.0000 | ORAL_TABLET | Freq: Every day | ORAL | Status: DC
  Administered 2013-04-07: 17.2 mg via ORAL
  Filled 2013-04-07: qty 2

## 2013-04-07 MED ORDER — CIPROFLOXACIN IN D5W 400 MG/200ML IV SOLN: 400.0000 mg | Freq: Two times a day (BID) | INTRAVENOUS | Status: DC

## 2013-04-07 MED ORDER — RISPERIDONE 2 MG PO TABS
2.0000 mg | ORAL_TABLET | Freq: Every day | ORAL | Status: DC
  Administered 2013-04-07: 2 mg via ORAL
  Filled 2013-04-07 (×2): qty 1

## 2013-04-07 MED ORDER — SODIUM CHLORIDE 0.9 % IJ SOLN
INTRAMUSCULAR | Status: AC
  Filled 2013-04-07: qty 3

## 2013-04-07 MED ORDER — HYDROCHLOROTHIAZIDE 12.5 MG PO CAPS
12.5000 mg | ORAL_CAPSULE | Freq: Every day | ORAL | Status: DC
  Administered 2013-04-08: 12.5 mg via ORAL
  Filled 2013-04-07: qty 1

## 2013-04-07 MED ORDER — INSULIN ASPART 100 UNIT/ML ~~LOC~~ SOLN
SUBCUTANEOUS | Status: AC
  Filled 2013-04-07: qty 1

## 2013-04-07 MED ORDER — ONDANSETRON HCL 4 MG/2ML IJ SOLN
4.0000 mg | Freq: Four times a day (QID) | INTRAMUSCULAR | Status: DC | PRN
  Filled 2013-04-07 (×2): qty 2

## 2013-04-07 MED ORDER — ACETAMINOPHEN 325 MG PO TABS: 650.0000 mg | ORAL_TABLET | Freq: Four times a day (QID) | ORAL | Status: DC | PRN

## 2013-04-07 MED ORDER — POTASSIUM CHLORIDE IN NACL 20-0.9 MEQ/L-% IV SOLN: INTRAVENOUS | Status: DC

## 2013-04-07 MED ORDER — IOHEXOL 300 MG/ML  SOLN
INTRAMUSCULAR | Status: DC | PRN
  Administered 2013-04-07: 2.5 mL

## 2013-04-07 MED ORDER — SODIUM CHLORIDE 0.9 % IV BOLUS (SEPSIS)
1000.0000 mL | Freq: Once | INTRAVENOUS | Status: AC
  Administered 2013-04-07: 1000 mL via INTRAVENOUS

## 2013-04-07 MED ORDER — DIPHENHYDRAMINE HCL 50 MG/ML IJ SOLN: 12.5000 mg | Freq: Four times a day (QID) | INTRAMUSCULAR | Status: DC | PRN

## 2013-04-07 MED ORDER — POLYETHYLENE GLYCOL 3350 17 G PO PACK
17.0000 g | PACK | Freq: Every day | ORAL | Status: DC | PRN
  Filled 2013-04-07: qty 1

## 2013-04-07 MED ORDER — GLIPIZIDE 5 MG PO TABS: 5.0000 mg | ORAL_TABLET | Freq: Two times a day (BID) | ORAL | Status: DC

## 2013-04-07 MED ORDER — METFORMIN HCL 500 MG PO TABS
1000.0000 mg | ORAL_TABLET | Freq: Two times a day (BID) | ORAL | Status: DC
  Administered 2013-04-08: 1000 mg via ORAL
  Filled 2013-04-07 (×3): qty 2

## 2013-04-07 MED ORDER — PANTOPRAZOLE SODIUM 40 MG IV SOLR
40.0000 mg | Freq: Every day | INTRAVENOUS | Status: DC
  Administered 2013-04-07: 40 mg via INTRAVENOUS
  Filled 2013-04-07 (×3): qty 40

## 2013-04-07 MED ORDER — HYDROCODONE-ACETAMINOPHEN 5-325 MG PO TABS
1.0000 | ORAL_TABLET | ORAL | Status: DC | PRN
  Administered 2013-04-08 (×2): 2 via ORAL
  Filled 2013-04-07 (×2): qty 2

## 2013-04-07 MED ORDER — GABAPENTIN 400 MG PO CAPS
400.0000 mg | ORAL_CAPSULE | Freq: Three times a day (TID) | ORAL | Status: DC
  Administered 2013-04-07 – 2013-04-08 (×2): 400 mg via ORAL
  Filled 2013-04-07 (×4): qty 1

## 2013-04-07 MED ORDER — FENTANYL CITRATE 0.05 MG/ML IJ SOLN
INTRAMUSCULAR | Status: DC | PRN
  Administered 2013-04-07 (×2): 100 ug via INTRAVENOUS
  Administered 2013-04-07: 50 ug via INTRAVENOUS
  Administered 2013-04-07: 100 ug via INTRAVENOUS

## 2013-04-07 MED ORDER — CIPROFLOXACIN IN D5W 400 MG/200ML IV SOLN
INTRAVENOUS | Status: AC
  Filled 2013-04-07: qty 200

## 2013-04-07 MED ORDER — ONDANSETRON HCL 4 MG/2ML IJ SOLN
4.0000 mg | Freq: Four times a day (QID) | INTRAMUSCULAR | Status: DC | PRN
  Administered 2013-04-07 (×2): 4 mg via INTRAVENOUS

## 2013-04-07 MED ORDER — BUPIVACAINE-EPINEPHRINE 0.25% -1:200000 IJ SOLN
INTRAMUSCULAR | Status: AC
  Filled 2013-04-07: qty 1

## 2013-04-07 MED ORDER — ESMOLOL HCL 10 MG/ML IV SOLN
INTRAVENOUS | Status: DC | PRN
  Administered 2013-04-07 (×3): 30 mg via INTRAVENOUS

## 2013-04-07 MED ORDER — MEDROXYPROGESTERONE ACETATE 10 MG PO TABS
10.0000 mg | ORAL_TABLET | Freq: Every day | ORAL | Status: DC
  Administered 2013-04-08: 10 mg via ORAL
  Filled 2013-04-07: qty 1

## 2013-04-07 MED ORDER — SUCCINYLCHOLINE CHLORIDE 20 MG/ML IJ SOLN
INTRAMUSCULAR | Status: DC | PRN
  Administered 2013-04-07 (×2): 100 mg via INTRAVENOUS

## 2013-04-07 MED ORDER — LACTATED RINGERS IV SOLN
INTRAVENOUS | Status: DC | PRN
  Administered 2013-04-07: 14:00:00 via INTRAVENOUS

## 2013-04-07 MED ORDER — GLYCOPYRROLATE 0.2 MG/ML IJ SOLN
INTRAMUSCULAR | Status: DC | PRN
  Administered 2013-04-07: 0.2 mg via INTRAVENOUS
  Administered 2013-04-07: 0.4 mg via INTRAVENOUS

## 2013-04-07 MED ORDER — POTASSIUM CHLORIDE IN NACL 20-0.9 MEQ/L-% IV SOLN
INTRAVENOUS | Status: DC
  Administered 2013-04-07 – 2013-04-08 (×2): via INTRAVENOUS
  Filled 2013-04-07 (×3): qty 1000

## 2013-04-07 MED ORDER — LACTATED RINGERS IV SOLN: INTRAVENOUS | Status: DC

## 2013-04-07 MED ORDER — POTASSIUM CHLORIDE IN NACL 20-0.9 MEQ/L-% IV SOLN
INTRAVENOUS | Status: AC
  Filled 2013-04-07: qty 1000

## 2013-04-07 MED ORDER — INSULIN GLARGINE 100 UNIT/ML ~~LOC~~ SOLN
25.0000 [IU] | Freq: Every day | SUBCUTANEOUS | Status: DC
  Administered 2013-04-07: 25 [IU] via SUBCUTANEOUS
  Filled 2013-04-07 (×2): qty 0.25

## 2013-04-07 MED ORDER — ONDANSETRON HCL 4 MG PO TABS: 4.0000 mg | ORAL_TABLET | Freq: Four times a day (QID) | ORAL | Status: DC | PRN

## 2013-04-07 MED ORDER — DOCUSATE SODIUM 100 MG PO CAPS
100.0000 mg | ORAL_CAPSULE | Freq: Two times a day (BID) | ORAL | Status: DC
  Administered 2013-04-07 – 2013-04-08 (×2): 100 mg via ORAL
  Filled 2013-04-07 (×3): qty 1

## 2013-04-07 MED ORDER — LACTATED RINGERS IV SOLN
INTRAVENOUS | Status: DC
  Administered 2013-04-07: 1000 mL via INTRAVENOUS

## 2013-04-07 MED ORDER — HEPARIN SODIUM (PORCINE) 5000 UNIT/ML IJ SOLN: 5000.0000 [IU] | Freq: Three times a day (TID) | INTRAMUSCULAR | Status: DC

## 2013-04-07 MED ORDER — CISATRACURIUM BESYLATE (PF) 10 MG/5ML IV SOLN
INTRAVENOUS | Status: DC | PRN
  Administered 2013-04-07: 4 mg via INTRAVENOUS
  Administered 2013-04-07: 6 mg via INTRAVENOUS

## 2013-04-07 MED ORDER — BUPIVACAINE-EPINEPHRINE 0.5% -1:200000 IJ SOLN
INTRAMUSCULAR | Status: DC | PRN
  Administered 2013-04-07: 20 mL

## 2013-04-07 MED ORDER — DIPHENHYDRAMINE HCL 12.5 MG/5ML PO ELIX: 12.5000 mg | ORAL_SOLUTION | Freq: Four times a day (QID) | ORAL | Status: DC | PRN

## 2013-04-07 MED ORDER — ACETAMINOPHEN 650 MG RE SUPP
650.0000 mg | Freq: Four times a day (QID) | RECTAL | Status: DC | PRN
  Filled 2013-04-07: qty 1

## 2013-04-07 MED ORDER — LIDOCAINE HCL (CARDIAC) 20 MG/ML IV SOLN
INTRAVENOUS | Status: DC | PRN
  Administered 2013-04-07: 30 mg via INTRAVENOUS

## 2013-04-07 MED ORDER — HYDROMORPHONE HCL PF 1 MG/ML IJ SOLN: 1.0000 mg | INTRAMUSCULAR | Status: DC | PRN

## 2013-04-07 MED ORDER — INSULIN ASPART 100 UNIT/ML ~~LOC~~ SOLN
0.0000 [IU] | Freq: Three times a day (TID) | SUBCUTANEOUS | Status: DC
  Administered 2013-04-07: 3 [IU] via SUBCUTANEOUS
  Administered 2013-04-08 (×2): 2 [IU] via SUBCUTANEOUS

## 2013-04-07 MED ORDER — HYDROMORPHONE HCL PF 1 MG/ML IJ SOLN
INTRAMUSCULAR | Status: DC | PRN
  Administered 2013-04-07 (×2): 1 mg via INTRAVENOUS

## 2013-04-07 MED ORDER — PROPOFOL 10 MG/ML IV BOLUS
INTRAVENOUS | Status: DC | PRN
  Administered 2013-04-07: 150 mg via INTRAVENOUS
  Administered 2013-04-07: 100 mg via INTRAVENOUS

## 2013-04-07 MED ORDER — MORPHINE SULFATE 2 MG/ML IJ SOLN
1.0000 mg | INTRAMUSCULAR | Status: DC | PRN
  Administered 2013-04-07 – 2013-04-08 (×4): 2 mg via INTRAVENOUS
  Filled 2013-04-07 (×4): qty 1

## 2013-04-07 MED ORDER — HEPARIN SODIUM (PORCINE) 5000 UNIT/ML IJ SOLN
5000.0000 [IU] | Freq: Three times a day (TID) | INTRAMUSCULAR | Status: DC
  Filled 2013-04-07 (×3): qty 1

## 2013-04-07 MED ORDER — MELOXICAM 15 MG PO TABS
15.0000 mg | ORAL_TABLET | Freq: Every day | ORAL | Status: DC
  Administered 2013-04-08: 15 mg via ORAL
  Filled 2013-04-07: qty 1

## 2013-04-07 MED ORDER — CIPROFLOXACIN IN D5W 400 MG/200ML IV SOLN
400.0000 mg | Freq: Two times a day (BID) | INTRAVENOUS | Status: DC
  Administered 2013-04-07: 400 mg via INTRAVENOUS

## 2013-04-07 MED ORDER — INSULIN ASPART 100 UNIT/ML ~~LOC~~ SOLN
0.0000 [IU] | SUBCUTANEOUS | Status: DC
  Administered 2013-04-07: 3 [IU] via SUBCUTANEOUS

## 2013-04-07 MED ORDER — HALOPERIDOL LACTATE 5 MG/ML IJ SOLN: 1.0000 mg | Freq: Four times a day (QID) | INTRAMUSCULAR | Status: DC | PRN

## 2013-04-07 MED ORDER — LINAGLIPTIN 5 MG PO TABS
5.0000 mg | ORAL_TABLET | Freq: Every day | ORAL | Status: DC
  Administered 2013-04-08: 5 mg via ORAL
  Filled 2013-04-07: qty 1

## 2013-04-07 MED ORDER — NEOSTIGMINE METHYLSULFATE 1 MG/ML IJ SOLN
INTRAMUSCULAR | Status: DC | PRN
  Administered 2013-04-07: 3 mg via INTRAVENOUS
  Administered 2013-04-07: 1 mg via INTRAVENOUS

## 2013-04-07 MED ORDER — ONDANSETRON HCL 4 MG/2ML IJ SOLN
4.0000 mg | Freq: Once | INTRAMUSCULAR | Status: AC
  Administered 2013-04-07: 4 mg via INTRAVENOUS
  Filled 2013-04-07: qty 2

## 2013-04-07 MED ORDER — HYDROMORPHONE HCL PF 1 MG/ML IJ SOLN
1.0000 mg | Freq: Once | INTRAMUSCULAR | Status: AC
  Administered 2013-04-07: 1 mg via INTRAVENOUS
  Filled 2013-04-07: qty 1

## 2013-04-07 MED ORDER — METOCLOPRAMIDE HCL 10 MG PO TABS
10.0000 mg | ORAL_TABLET | Freq: Three times a day (TID) | ORAL | Status: DC
  Administered 2013-04-07 – 2013-04-08 (×3): 10 mg via ORAL
  Filled 2013-04-07 (×6): qty 1

## 2013-04-07 MED ORDER — MIDAZOLAM HCL 5 MG/5ML IJ SOLN
INTRAMUSCULAR | Status: DC | PRN
  Administered 2013-04-07: 2 mg via INTRAVENOUS
  Administered 2013-04-07: 1 mg via INTRAVENOUS

## 2013-04-07 MED ORDER — LACTATED RINGERS IV SOLN
INTRAVENOUS | Status: DC | PRN
  Administered 2013-04-07: 13:00:00 via INTRAVENOUS

## 2013-04-07 MED ORDER — METFORMIN HCL 500 MG PO TABS: 1000.0000 mg | ORAL_TABLET | Freq: Two times a day (BID) | ORAL | Status: DC

## 2013-04-07 MED ORDER — HYDROMORPHONE HCL PF 1 MG/ML IJ SOLN: 0.2500 mg | INTRAMUSCULAR | Status: DC | PRN

## 2013-04-07 MED ORDER — ENOXAPARIN SODIUM 40 MG/0.4ML ~~LOC~~ SOLN
40.0000 mg | SUBCUTANEOUS | Status: DC
  Administered 2013-04-08: 40 mg via SUBCUTANEOUS
  Filled 2013-04-07: qty 0.4

## 2013-04-07 SURGICAL SUPPLY — 37 items
APPLIER CLIP ROT 10 11.4 M/L (STAPLE) ×2
BENZOIN TINCTURE PRP APPL 2/3 (GAUZE/BANDAGES/DRESSINGS) IMPLANT
CABLE HIGH FREQUENCY MONO STRZ (ELECTRODE) ×2 IMPLANT
CANISTER SUCTION 2500CC (MISCELLANEOUS) ×2 IMPLANT
CLIP APPLIE ROT 10 11.4 M/L (STAPLE) ×1 IMPLANT
CLOTH BEACON ORANGE TIMEOUT ST (SAFETY) ×2 IMPLANT
COVER MAYO STAND STRL (DRAPES) ×2 IMPLANT
DECANTER SPIKE VIAL GLASS SM (MISCELLANEOUS) ×2 IMPLANT
DERMABOND ADVANCED (GAUZE/BANDAGES/DRESSINGS) ×2
DERMABOND ADVANCED .7 DNX12 (GAUZE/BANDAGES/DRESSINGS) ×2 IMPLANT
DRAPE C-ARM 42X120 X-RAY (DRAPES) ×2 IMPLANT
DRAPE LAPAROSCOPIC ABDOMINAL (DRAPES) ×2 IMPLANT
ELECT REM PT RETURN 9FT ADLT (ELECTROSURGICAL) ×2
ELECTRODE REM PT RTRN 9FT ADLT (ELECTROSURGICAL) ×1 IMPLANT
GLOVE BIOGEL PI IND STRL 7.0 (GLOVE) ×1 IMPLANT
GLOVE BIOGEL PI INDICATOR 7.0 (GLOVE) ×1
GLOVE EUDERMIC 7 POWDERFREE (GLOVE) ×2 IMPLANT
GOWN PREVENTION PLUS LG XLONG (DISPOSABLE) ×2 IMPLANT
GOWN STRL REIN XL XLG (GOWN DISPOSABLE) ×8 IMPLANT
HEMOSTAT SNOW SURGICEL 2X4 (HEMOSTASIS) IMPLANT
IV LACTATED RINGER IRRG 3000ML (IV SOLUTION) ×1
IV LR IRRIG 3000ML ARTHROMATIC (IV SOLUTION) ×1 IMPLANT
KIT BASIN OR (CUSTOM PROCEDURE TRAY) ×2 IMPLANT
NS IRRIG 1000ML POUR BTL (IV SOLUTION) ×2 IMPLANT
POUCH SPECIMEN RETRIEVAL 10MM (ENDOMECHANICALS) ×2 IMPLANT
SCISSORS LAP 5X35 DISP (ENDOMECHANICALS) ×2 IMPLANT
SET CHOLANGIOGRAPH MIX (MISCELLANEOUS) ×2 IMPLANT
SET IRRIG TUBING LAPAROSCOPIC (IRRIGATION / IRRIGATOR) ×2 IMPLANT
SOLUTION ANTI FOG 6CC (MISCELLANEOUS) ×2 IMPLANT
STRIP CLOSURE SKIN 1/2X4 (GAUZE/BANDAGES/DRESSINGS) IMPLANT
SUT MNCRL AB 4-0 PS2 18 (SUTURE) ×2 IMPLANT
TOWEL OR 17X26 10 PK STRL BLUE (TOWEL DISPOSABLE) ×4 IMPLANT
TRAY LAP CHOLE (CUSTOM PROCEDURE TRAY) ×2 IMPLANT
TROCAR BLADELESS OPT 5 75 (ENDOMECHANICALS) ×2 IMPLANT
TROCAR XCEL BLUNT TIP 100MML (ENDOMECHANICALS) ×2 IMPLANT
TROCAR XCEL NON-BLD 11X100MML (ENDOMECHANICALS) ×2 IMPLANT
TUBING INSUFFLATION 10FT LAP (TUBING) ×2 IMPLANT

## 2013-04-07 NOTE — Consult Note (Addendum)
[facility] Medical Consultation  Mary [name] NWG:[id number] DOB: 1970-08-16 DOA: 04/07/2013 PCP: Dartha Lodge, FNP   Requesting physician: Dr. Derrell Lolling Date of consultation:  04/07/2013 Reason for consultation: management of chronic medical problems, particularly diabetes and bipolar d/o  Impression/Recommendations Principal Problem:   Symptomatic cholelithiasis Active Problems:   Bipolar 2 disorder   Seizure disorder   Tobacco abuse   DUB (dysfunctional uterine bleeding)   Diabetes mellitus type 1.5, managed as type 2   Principal Problem:   Symptomatic cholelithiasis Active Problems:   Bipolar 2 disorder   Seizure disorder   Tobacco abuse   DUB (dysfunctional uterine bleeding)   Diabetes mellitus type 1.5, managed as type 2   Symptomatic cholelithiasis s/p laparoscopic cholecystectomy on 10/22 -  Management per general surgery - Will add bowel regimen to prevent constipation while on narcotic medications  T1.5DM, fingersticks elevated in 150-221 range.  Last A1c 13.2 03/13/2013.  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  Continue PPI  DUB, stable.  Continue provera  Seizure d/o, currently seizure free on  no medications.    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   -  Consider outpatient hepatology referral for possible treatment  I will followup again tomorrow. Please contact me if I can be of assistance in the meanwhile. Thank you for this consultation.  Chief Complaint: abdominal pain  HPI:  The patient is a 42 year old female with history of mixed Type 1/type 2 diabetes with frequent DKA with peripheral neuropathy, bipolar 2 disorder, PTSD, seizure disorder, HCV with chronic hepatitis and without cirrhosis, kidney stones, gastritis, who presented with intermittent RUQ abdominal pain several times to the emergency department.  Pain was 10/10 sharp without radiation.  She had nausea with nonbilious, nonbloody emesis.  Previous imaging had demonstrated gallstones without cholecystitis.  During this visit to the ER, surgery was consulted due to the persistence and severity of her symptoms and she was taken to the OR for cholecystectomy.  She is currently recovering.   Diabetes mellitus.  She states she has type 1, however, she is on metformin, januvia, and was previously on SU.  She also frequently goes into DKA.  She states her last A1c was greater than 10 and she has been more consistent with her lantus administration.  She does not take Mary [name] acting insulin.    Bipolar disorder.  Denies hospitalizations for this and sees a psychiatrist for her risperidone which was started about 6 months ago.  It is working "okay" but not "great."  Per notes, she was crying and emotionally labile in the ER before surgery.    Review of Systems:   Denies fevers, chills, weight loss or gain, changes to hearing and vision.  Denies rhinorrhea, sinus congestion, sore throat.  Denies chest pain and palpitations.  Denies SOB, wheezing, cough.  Per HPI.  Denies dysuria, frequency, urgency, polyuria, polydipsia.  Denies hematemesis, blood in stools, melena, abnormal bruising or bleeding.  Denies lymphadenopathy.   Chronic pain and numbness of feet and hands.  Denies skin rash or ulcer.  Denies lower extremity edema.  Denies focal weakness, slurred speech, confusion, facial droop.  + anxiety and depression.    Past Medical History  Diagnosis Date  . Diabetes mellitus     diagnosed in 1996-always been on insulin  . Kidney stone   . Seizure disorder     started with pregnancy of first son  . Gastritis   . Bipolar 2 disorder   . Post traumatic stress disorder     "flipping out" after people close to her died  . DKA (diabetic ketoacidoses)     Recurrent admissions for DKA, medication non-complaince, poor social situation  . Heart murmur   . Arthritis     r ankle-S/P surgery (2007)  . Diabetic neuropathy, type I diabetes mellitus     numbness bilaterally feet  . Anemia     childhood  . Depression     childhood  . Hepatitis C     biopsy in 2010-no rx-was supposed to see a hepatologist in [place].  With cirrhosis  . Neuropathy     in legs and feet and hands  . Cataracts, bilateral   . Scoliosis    Past Surgical History  Procedure Laterality Date  . Ankle surgery  2007  . Cesarean section    . Endometrial biopsy  06/22/2012  . Cholecystectomy  04/07/2013   Social History:  reports that she has been smoking Cigarettes.  She has a 2.5 pack-year smoking history. She has never used smokeless tobacco. She reports that she uses illicit drugs (Marijuana) about once per week. She reports that she does not drink alcohol.  Allergies  Allergen Reactions  . Penicillins Rash   Family History  Problem Relation Age of Onset  . Diabetes Mother     currently 1  . Fibromyalgia Mother   . Cirrhosis Father     died in 30  . Diabetes Maternal Grandmother   . Diabetes Maternal Aunt     Prior to Admission medications   Medication Sig Start Date End Date Taking? Authorizing Provider  aspirin EC 81 MG tablet Take 81 mg by mouth daily.    Yes Historical Provider, MD  calcium carbonate (TUMS -  DOSED IN MG ELEMENTAL CALCIUM) 500 MG chewable tablet Chew 2 tablets by mouth daily as needed. For heartburn   Yes Historical Provider, MD  gabapentin (NEURONTIN) 400 MG capsule Take 400 mg by mouth 3 (three) times daily.   Yes Historical Provider, MD  glipiZIDE (GLUCOTROL) 5 MG tablet Take 5 mg by mouth 2 (two) times daily.   Yes Historical Provider, MD  hydrochlorothiazide (MICROZIDE) 12.5 MG capsule Take 12.5 mg by mouth daily.   Yes Historical Provider, MD  insulin glargine (LANTUS) 100 UNIT/ML injection Inject 50 Units into the skin at bedtime.   Yes Historical Provider, MD  medroxyPROGESTERone (PROVERA) 10 MG tablet Take 1 tablet (10 mg total) by mouth daily. 02/25/13  Yes Reuben Likes, MD  meloxicam (MOBIC) 15 MG tablet Take 15 mg by mouth daily.    Yes Historical Provider, MD  metFORMIN (GLUCOPHAGE) 1000 MG tablet Take 1,000 mg by mouth 2 (two) times daily.   Yes Historical Provider,  MD  metoCLOPramide (REGLAN) 10 MG tablet Take 10 mg by mouth 3 (three) times daily before meals.   Yes Historical Provider, MD  Multiple Vitamin (MULTIVITAMIN WITH MINERALS) TABS tablet Take 1 tablet by mouth daily.   Yes Historical Provider, MD  ranitidine (ZANTAC) 150 MG tablet Take 150 mg by mouth 2 (two) times daily.   Yes Historical Provider, MD  risperidone (RISPERDAL) 4 MG tablet Take 2 mg by mouth at bedtime.    Yes Historical Provider, MD  sitaGLIPtin (JANUVIA) 100 MG tablet Take 100 mg by mouth daily.   Yes Historical Provider, MD  HYDROcodone-acetaminophen (NORCO/VICODIN) 5-325 MG per tablet Take 1 tablet by mouth every 4 (four) hours as needed for pain. 04/04/13   Lyanne Co, MD  ondansetron (ZOFRAN ODT) 8 MG disintegrating tablet Take 1 tablet (8 mg total) by mouth every 8 (eight) hours as needed for nausea. 04/04/13   Lyanne Co, MD   Physical Exam: Blood pressure 152/87, pulse 63, temperature 98.3 F (36.8 C), temperature source Oral, resp. rate 16, height 5\' 4"  (1.626 m), weight 56.7 kg  (125 lb), last menstrual period 04/06/2013, SpO2 100.00%. Filed Vitals:   04/07/13 1728 04/07/13 1745 04/07/13 1838 04/07/13 1945  BP:  162/89 147/85 152/87  Pulse:  64 64 63  Temp:  97.5 F (36.4 C) 98 F (36.7 C) 98.3 F (36.8 C)  TempSrc:  Oral Oral Oral  Resp:  14 16 16   Height: 5\' 4"  (1.626 m)     Weight: 56.7 kg (125 lb)     SpO2:  100% 100% 100%     General:  Thin AAF, no acute distress  Eyes:  PERRL, anicteric, non-injected.  ENT:  Nares clear.  OP clear, non-erythematous without plaques or exudates.  MMM.  Neck:  Supple without TM or JVD.    Lymph:  No cervical, supraclavicular, or submandibular LAD.  Cardiovascular:  RRR, normal S1, possible gallop, no rubs or murmurs.  2+ pulses, warm extremities  Respiratory:  CTA bilaterally without increased WOB.  Abdomen:  Hypoactive BS.  Soft, moderately distended and TTP diffusely with some voluntary guarding, no rebound  Skin:  No rashes or focal lesions.  Musculoskeletal:  Normal bulk and tone.  No LE edema.  Psychiatric:  A & O x 4.  Appropriate affect.  Neurologic:  CN 3-12 intact.  5/5 strength.  Sensation intact.  Labs on Admission:  Basic Metabolic Panel:  Recent Labs Lab 04/01/13 2231 04/02/13 0645 04/04/13 1953 04/06/13 1039 04/07/13 0721  NA 133*  --  137 136 134*  K 3.6  --  3.5 3.5 3.7  CL 97  --  104 101 95*  CO2 23  --  23 24 25   GLUCOSE 203*  --  117* 256* 236*  BUN 19  --  13 12 13   CREATININE 0.53 0.49* 0.45* 0.52 0.60  CALCIUM 9.7  --  9.2 9.1 9.8   Liver Function Tests:  Recent Labs Lab 04/01/13 2231 04/04/13 1953 04/06/13 1039 04/07/13 0721  AST 57* 67* 48* 53*  ALT 48* 58* 48* 52*  ALKPHOS 80 69 62 69  BILITOT 0.6 0.2* 0.3 0.4  PROT 7.9 6.9 6.8 7.5  ALBUMIN 3.9 3.5 3.4* 3.8    Recent Labs Lab 04/01/13 2231 04/02/13 1350 04/04/13 1953 04/06/13 1039 04/07/13 0721  LIPASE 30  --  20 22 21   AMYLASE 211* 100  --   --   --    No results found for this basename:  AMMONIA,  in the last 168 hours CBC:  Recent Labs Lab 04/01/13 2231 04/02/13 0645 04/04/13 1953 04/06/13 1015 04/07/13 0721  WBC 11.3* 8.4 12.0* 7.4 9.7  NEUTROABS 7.3  --  8.7*  --  6.2  HGB 11.9* 10.6* 11.8* 12.3 11.6*  HCT 37.0 33.0* 36.7 37.9 35.8*  MCV 70.9* 71.7* 70.8* 70.7* 70.6*  PLT 185 181 164 154 226   Cardiac Enzymes: No results found for this basename: CKTOTAL, CKMB, CKMBINDEX, TROPONINI,  in the last 168 hours BNP: No components found with this basename: POCBNP,  CBG:  Recent Labs Lab 04/02/13 1141 04/02/13 1659 04/06/13 1159 04/07/13 1538 04/07/13 1713  GLUCAP 203* 151* 221* 166* 170*    Radiological Exams on Admission: Dg Cholangiogram Operative  04/07/2013   CLINICAL DATA:  cholelithiasis  EXAM: INTRAOPERATIVE CHOLANGIOGRAM  TECHNIQUE: Cholangiographic images from the C-arm fluoroscopic device were submitted for interpretation post-operatively. Please see the procedural report for the amount of contrast and the fluoroscopy time utilized.  COMPARISON:  None.  FINDINGS: The gallbladder has been removed, and the cystic duct has been cannulated. The intrahepatic and extrahepatic biliary ducts appear normal. There is no mass or calculus. There is apparent free flow of contrast via the common bile duct into the duodenum.  IMPRESSION: No abnormality noted.  No obstructing lesion.   Electronically Signed   By: Bretta Bang M.D.   On: 04/07/2013 15:35    EKG: Independently reviewed. NSR, QTc wnl  Time spent: 75 min  Mary [name] [facility] Pager [phone]  If 7PM-7AM, please contact night-coverage [email or web address] Password [email or web address] 04/07/2013, 8:00 PM

## 2013-04-07 NOTE — ED Notes (Signed)
PA at bedside.

## 2013-04-07 NOTE — H&P (Signed)
Mary [name] is an 42 y.o. female.   Chief Complaint: Abdominal pain, nausea and vomiting for 1 week HPI: Pt. Presents with one week history of nausea, vomiting, and abdominal pain, primarily located in RUQ ongoing for over a week. Pain is sharp, and persistent. Associated symptoms of anorexia, took Ibuprofen with no relief, as she immediately vomited. Denies fever, chills, headache, cp, sob, dysuria, hematuria, hematochezia or melena. Has made 4 visits to this ED this week for similar concerns. Has abdominal US on 10/19 which shows cholelithiasis without cholecystitis. Discharge yesterday with pain medication from an ER visit she had an appointment today in our office but could not get there. States she cannot afford pain medication on outpatient basis. She returns to the ER today.  She is tearful, her exam is not very impressive.  Her labs show a normal WBC, LFT's show minimal transaminase elevation, lipase is normal.  We are ask to see.   Past Medical History  Diagnosis Date  . Diabetes mellitus, Insulin dependant  DKA hospitalization 1 month ago.  diagnosed in 1996-always been on insulin      Hepatitis C  She does not know the source of Hepatitis C.    biopsy in 2010-no rx-was supposed to see a hepatologist in [place].  With cirrhosis     Bipolar 2 disorder     Post traumatic stress disorder    "flipping out" after people close to her died     Depression    childhood     DKA (diabetic ketoacidoses)    Recurrent admissions for DKA, medication non-complaince, poor social situation     Neuropathy    in legs and feet and hands     . Seizure disorder    started with pregnancy of first son  .  Tobacco use   . Peripheral vascular disease      . Arthritis   r ankle-S/P surgery (2007)      Chronic Back pain.  . Kidney stone     . Heart murmur   . Anemia    childhood    Past Surgical History  Procedure Laterality Date  . Ankle surgery    . Cesarean section    .  Endometrial biopsy  06/22/2012    Family History  Problem Relation Age of Onset  . Diabetes Mother     currently 58  . Fibromyalgia Mother   . Cirrhosis Father     died in 40   Social History:  reports that she has been smoking Cigarettes.  She has a 2.5 pack-year smoking history. She has never used smokeless tobacco. She reports that she uses illicit drugs (Marijuana) about once per week. She reports that she does not drink alcohol. Tobacco: 28 years ETOH: none Drugs: none Allergies:  Allergies  Allergen Reactions  . Penicillins Rash   Prior to Admission medications   Medication Sig Start Date End Date Taking? Authorizing Provider  aspirin EC 81 MG tablet Take 81 mg by mouth daily.    Yes Historical Provider, MD  calcium carbonate (TUMS - DOSED IN MG ELEMENTAL CALCIUM) 500 MG chewable tablet Chew 2 tablets by mouth daily as needed. For heartburn   Yes Historical Provider, MD  gabapentin (NEURONTIN) 400 MG capsule Take 400 mg by mouth 3 (three) times daily.   Yes Historical Provider, MD  glipiZIDE (GLUCOTROL) 5 MG tablet Take 5 mg by mouth 2 (two) times daily.   Yes Historical Provider, MD  hydrochlorothiazide (MICROZIDE) 12.5 MG capsule  Take 12.5 mg by mouth daily.   Yes Historical Provider, MD  insulin glargine (LANTUS) 100 UNIT/ML injection Inject 50 Units into the skin at bedtime.   Yes Historical Provider, MD  medroxyPROGESTERone (PROVERA) 10 MG tablet Take 1 tablet (10 mg total) by mouth daily. 02/25/13  Yes Reuben Likes, MD  meloxicam (MOBIC) 15 MG tablet Take 15 mg by mouth daily.    Yes Historical Provider, MD  metFORMIN (GLUCOPHAGE) 1000 MG tablet Take 1,000 mg by mouth 2 (two) times daily.   Yes Historical Provider, MD  metoCLOPramide (REGLAN) 10 MG tablet Take 10 mg by mouth 3 (three) times daily before meals.   Yes Historical Provider, MD  Multiple Vitamin (MULTIVITAMIN WITH MINERALS) TABS tablet Take 1 tablet by mouth daily.   Yes Historical Provider, MD  ranitidine  (ZANTAC) 150 MG tablet Take 150 mg by mouth 2 (two) times daily.   Yes Historical Provider, MD  risperidone (RISPERDAL) 4 MG tablet Take 2 mg by mouth at bedtime.    Yes Historical Provider, MD  sitaGLIPtin (JANUVIA) 100 MG tablet Take 100 mg by mouth daily.   Yes Historical Provider, MD  HYDROcodone-acetaminophen (NORCO/VICODIN) 5-325 MG per tablet Take 1 tablet by mouth every 4 (four) hours as needed for pain. 04/04/13   Lyanne Co, MD  ondansetron (ZOFRAN ODT) 8 MG disintegrating tablet Take 1 tablet (8 mg total) by mouth every 8 (eight) hours as needed for nausea. 04/04/13   Lyanne Co, MD     (Not in a hospital admission)  Results for orders placed during the hospital encounter of 04/07/13 (from the past 48 hour(s))  CBC WITH DIFFERENTIAL     Status: Abnormal   Collection Time    04/07/13  7:21 AM      Result Value Range   WBC 9.7  4.0 - 10.5 K/uL   RBC 5.07  3.87 - 5.11 MIL/uL   Hemoglobin 11.6 (*) 12.0 - 15.0 g/dL   HCT 16.1 (*) 09.6 - 04.5 %   MCV 70.6 (*) 78.0 - 100.0 fL   MCH 22.9 (*) 26.0 - 34.0 pg   MCHC 32.4  30.0 - 36.0 g/dL   RDW 40.9  81.1 - 91.4 %   Platelets 226  150 - 400 K/uL   Comment: REPEATED TO VERIFY     DELTA CHECK NOTED     PLATELET COUNT CONFIRMED BY SMEAR   Neutrophils Relative % 64  43 - 77 %   Lymphocytes Relative 29  12 - 46 %   Monocytes Relative 6  3 - 12 %   Eosinophils Relative 1  0 - 5 %   Basophils Relative 0  0 - 1 %   Neutro Abs 6.2  1.7 - 7.7 K/uL   Lymphs Abs 2.8  0.7 - 4.0 K/uL   Monocytes Absolute 0.6  0.1 - 1.0 K/uL   Eosinophils Absolute 0.1  0.0 - 0.7 K/uL   Basophils Absolute 0.0  0.0 - 0.1 K/uL   RBC Morphology ELLIPTOCYTES    COMPREHENSIVE METABOLIC PANEL     Status: Abnormal   Collection Time    04/07/13  7:21 AM      Result Value Range   Sodium 134 (*) 135 - 145 mEq/L   Potassium 3.7  3.5 - 5.1 mEq/L   Chloride 95 (*) 96 - 112 mEq/L   CO2 25  19 - 32 mEq/L   Glucose, Bld 236 (*) 70 - 99 mg/dL   BUN 13  6 - 23  mg/dL   Creatinine, Ser 1.61  0.50 - 1.10 mg/dL   Calcium 9.8  8.4 - 09.6 mg/dL   Total Protein 7.5  6.0 - 8.3 g/dL   Albumin 3.8  3.5 - 5.2 g/dL   AST 53 (*) 0 - 37 U/L   ALT 52 (*) 0 - 35 U/L   Alkaline Phosphatase 69  39 - 117 U/L   Total Bilirubin 0.4  0.3 - 1.2 mg/dL   GFR calc non Af Amer >90  >90 mL/min   GFR calc Af Amer >90  >90 mL/min   Comment: (NOTE)     The eGFR has been calculated using the CKD EPI equation.     This calculation has not been validated in all clinical situations.     eGFR's persistently <90 mL/min signify possible Chronic Kidney     Disease.  LIPASE, BLOOD     Status: None   Collection Time    04/07/13  7:21 AM      Result Value Range   Lipase 21  11 - 59 U/L  CG4 I-STAT (LACTIC ACID)     Status: None   Collection Time    04/07/13  8:08 AM      Result Value Range   Lactic Acid, Venous 1.58  0.5 - 2.2 mmol/L  PREGNANCY, URINE     Status: None   Collection Time    04/07/13  9:09 AM      Result Value Range   Preg Test, Ur NEGATIVE  NEGATIVE   Comment:            THE SENSITIVITY OF THIS     METHODOLOGY IS >20 mIU/mL.   No results found.  Review of Systems  Constitutional: Positive for weight loss (maybe she's not sure). Negative for fever, chills, malaise/fatigue and diaphoresis.  HENT: Negative.   Eyes: Negative.        She reports cataracts.  Respiratory: Positive for cough (smokers cough) and sputum production (clear). Negative for hemoptysis, shortness of breath and wheezing.   Cardiovascular: Positive for leg swelling (occasional).  Gastrointestinal: Positive for heartburn, nausea, vomiting and abdominal pain (RUQ). Negative for diarrhea, constipation, blood in stool and melena.  Genitourinary: Negative.   Musculoskeletal: Positive for back pain (chronic).  Skin: Negative.   Neurological: Positive for seizures (she cannot remember when she had her last seizure). Negative for weakness.  Endo/Heme/Allergies: Negative.    Psychiatric/Behavioral: Positive for depression. The patient is nervous/anxious.        Bipolar Still has episodes of flipping out, "small things," can cause it.    Blood pressure 144/72, pulse 62, temperature 98.2 F (36.8 C), temperature source Oral, resp. rate 12, weight 56.7 kg (125 lb), last menstrual period 04/06/2013, SpO2 97.00%. Physical Exam  Constitutional: She is oriented to person, place, and time. She appears well-developed.   Thin female, anxious, tearful complaining of unremitting pain for 1 week.  HENT:  Head: Normocephalic and atraumatic.  Nose: Nose normal.  Eyes: Conjunctivae and EOM are normal. Pupils are equal, round, and reactive to light. Right eye exhibits no discharge. Left eye exhibits no discharge. No scleral icterus.  Neck: Normal range of motion. Neck supple. No JVD present. No tracheal deviation present. Thyromegaly present.  Cardiovascular: Normal rate, regular rhythm, normal heart sounds and intact distal pulses.  Exam reveals no gallop.   No murmur heard. Respiratory: Effort normal and breath sounds normal. No stridor. She has no wheezes. She has no rales.  She exhibits no tenderness.  GI: Soft. Bowel sounds are normal. She exhibits no distension and no mass. There is tenderness (ruq, she really doesn't appear that tender on exam but she points to RUQ for pain.). There is no rebound and no guarding.  Musculoskeletal: She exhibits no edema and no tenderness.  Lymphadenopathy:    She has no cervical adenopathy.  Neurological: She is alert and oriented to person, place, and time. No cranial nerve deficit.  Skin: Skin is warm and dry. No rash noted. No erythema. No pallor.  Psychiatric:  Tearful, and very anxious     Assessment/Plan 1.  Cholelithiasis with abdominal pain 2. Bipolar/depression/PTSD/anxiety 3.  AODM/ID/hx of DKA poor compliance 4. Hepatitis C 5.  Seizure disorder, she cannot remember last seizure 6.  Tobacco use 7.  Chronic back pain  and bilateral lower extremity/upper extremity pain 8.  Neuropathy  Plan:  We will admit and plan cholecystectomy this afternoon.  We have ask Medicine to see and follow to help with her medical issues. Calvin Jablonowski 04/07/2013, 10:58 AM

## 2013-04-07 NOTE — ED Notes (Addendum)
Patient seen in ED and dx with gallstones  Patient has been seen in this ED on 10/21, 10/19, 10/16 for c/o abdominal pain  Patient was discharged on 10/21 with Rx x 2 for pain and nausea Patient did not get these filled because she states that she can't afford Rx Patient states that she wants pain medication Dilaudid and to be admitted for pain control

## 2013-04-07 NOTE — Transfer of Care (Signed)
Immediate Anesthesia Transfer of Care Note  Patient: Mary [name]  Procedure(s) Performed: Procedure(s): LAPAROSCOPIC CHOLECYSTECTOMY WITH INTRAOPERATIVE CHOLANGIOGRAM (N/A)  Patient Location: PACU  Anesthesia Type:General  Level of Consciousness: oriented, sedated and patient cooperative  Airway & Oxygen Therapy: Patient Spontanous Breathing and Patient connected to face mask oxygen  Post-op Assessment: Report given to PACU RN and Post -op Vital signs reviewed and stable  Post vital signs: stable  Complications: No apparent anesthesia complications

## 2013-04-07 NOTE — ED Notes (Signed)
Bed: YN82 Expected date:  Expected time:  Means of arrival:  Comments: EMS/42 yo female with RUQ pain-recent visit for same complaint

## 2013-04-07 NOTE — Anesthesia Preprocedure Evaluation (Addendum)
Anesthesia Evaluation  Patient identified by MRN, date of birth, ID band Patient awake    Reviewed: Allergy & Precautions, H&P , NPO status , Patient's Chart, lab work & pertinent test results, reviewed documented beta blocker date and time   Airway Mallampati: II TM Distance: >3 FB Neck ROM: full    Dental no notable dental hx. (+) Teeth Intact and Dental Advisory Given   Pulmonary neg pulmonary ROS, Current Smoker,  breath sounds clear to auscultation  Pulmonary exam normal       Cardiovascular Exercise Tolerance: Good negative cardio ROS  Rhythm:regular Rate:Normal     Neuro/Psych Seizures -,  Depression Bipolar Disorder negative neurological ROS  negative psych ROS   GI/Hepatic negative GI ROS, (+) Cirrhosis -       , Hepatitis -, C  Endo/Other  diabetes, Poorly Controlled, Type 2, Insulin DependentDKA poorly controlled  Renal/GU negative Renal ROS  negative genitourinary   Musculoskeletal   Abdominal   Peds  Hematology negative hematology ROS (+)   Anesthesia Other Findings   Reproductive/Obstetrics negative OB ROS                          Anesthesia Physical Anesthesia Plan  ASA: III  Anesthesia Plan: General   Post-op Pain Management:    Induction: Intravenous  Airway Management Planned: Oral ETT  Additional Equipment:   Intra-op Plan:   Post-operative Plan: Extubation in OR  Informed Consent: I have reviewed the patients History and Physical, chart, labs and discussed the procedure including the risks, benefits and alternatives for the proposed anesthesia with the patient or authorized representative who has indicated his/her understanding and acceptance.   Dental Advisory Given  Plan Discussed with: CRNA and Surgeon  Anesthesia Plan Comments:         Anesthesia Quick Evaluation

## 2013-04-07 NOTE — ED Notes (Signed)
Liberty Handy., RN at bedside attempting IV insertion.

## 2013-04-07 NOTE — ED Notes (Signed)
Report given to Ajsa, RN  End of assignment

## 2013-04-07 NOTE — Progress Notes (Signed)
   CARE MANAGEMENT ED NOTE 04/07/2013  Patient:  SIDNIE, SWALLEY   Account Number:  [id number]  Date Initiated:  04/07/2013  Documentation initiated by:  Dennis Bast Assessment:   42 yr old female self pay patient Seen in [facility]  ED on 10/21, 10/19, 10/16 for c/o abdominal pain Dx wiht gall stones 4 Admissions and 8 ED visits in the last 6 months      Subjective/Objective Assessment Detail:   Pt confirms self pay Pt confirms pcp Dr Dartha Lodge at [facility]  In chart review several [facility] CMs have documented pt has voiced she gets her medications [facility]. Pt has voiced she needed assist with "pain medicine" [facility] MATCH programs does not provide assistance for narcotic medications     Action/Plan:   Action/Plan Detail:   Cm spoke with pt to confirm pcp name Cm spoke with Diane at [facility] to confirm pt seen by Dr Dartha Lodge and PA Lauris Poag  CM reviewed PDMI pt found to be eligible for [facility] MATCH assistance   Anticipated DC Date:       Status Recommendation to Physician:   Result of Recommendation:    Other ED Services  Consult Working Plan    DC Planning Services  Other  PCP issues  Outpatient Services - Pt will follow up    Choice offered to / List presented to:            Status of service:  Completed, signed off  ED Comments:   ED Comments Detail:

## 2013-04-07 NOTE — Anesthesia Postprocedure Evaluation (Signed)
  Anesthesia Post-op Note  Patient: Mary [name]  Procedure(s) Performed: Procedure(s) (LRB): LAPAROSCOPIC CHOLECYSTECTOMY WITH INTRAOPERATIVE CHOLANGIOGRAM (N/A)  Patient Location: PACU  Anesthesia Type: General  Level of Consciousness: awake and alert   Airway and Oxygen Therapy: Patient Spontanous Breathing  Post-op Pain: mild  Post-op Assessment: Post-op Vital signs reviewed, Patient's Cardiovascular Status Stable, Respiratory Function Stable, Patent Airway and No signs of Nausea or vomiting  Last Vitals:  Filed Vitals:   04/07/13 1945  BP: 152/87  Pulse: 63  Temp: 36.8 C  Resp: 16    Post-op Vital Signs: stable   Complications: No apparent anesthesia complications

## 2013-04-07 NOTE — H&P (Signed)
General surgery attending note:  I have personally interviewed and examined this patient. I have reviewed the imaging studies and laboratory work. I have discussed her care with emergency department physician and with Mr. Marlyne Beards, [place].  She has a one-week history of right upper quadrant pain nausea and vomiting. She has mild tenderness but no palpable mass. Ultrasound shows gallstones, but no wall thickening. This was performed 48 hours ago. She has numerous comorbidities and we will ask the [facility] hospitalist for help with management.  She will be admitted and taken to the operating room for laparoscopic cholecystectomy with cholangiogram, possible open. I have discussed indications, details, techniques, and numerous risks of the surgery with her. I explained the risks of bleeding, infection, conversion to open laparotomy, bile leak, wound healing problems, hernia, injury to adjacent organs with major reconstructive surgery, and other unforeseen problems. She understands these issues well. At this time all of her questions are answered. She agrees with this plan.She is awake and alert and oriented and understands the procedure proposed and its implications.   Angelia Mould. Derrell Lolling, M.D., [facility] General and Minimally invasive Surgery Breast and Colorectal Surgery Office:   [phone] Pager:   [phone]

## 2013-04-07 NOTE — Op Note (Signed)
Patient Name:           Mary [name]   Date of Surgery:        04/07/2013  Pre op Diagnosis:      Subacute and chronic cholecystitis with cholelithiasis  Post op Diagnosis:    Same  Procedure:                 Laparoscopic cholecystectomy with cholangiogram  Surgeon:                     Angelia Mould. Derrell Lolling, M.D., FACS  Assistant:                      Zola Button, [place]  Operative Indications:   This is a 42 year old Afro-American female with bipolar disorder, seizure disorder, insulin-dependent diabetes mellitus, history of DKA, and hepatitis C. She presents with a three-week history of right upper quadrant pain nausea and vomiting. Right blood cell count is normal. Liver function tests are mildly elevated. Ultrasound shows multiple gallstones but no thickening. She has had repeated visits to the emergency room recently. She is admitted and brought  to the operating room for cholecystectomy  Operative Findings:       The gallbladder was slightly edematous but mostly chronically inflamed. It was densely adhered to the bed of the liver and this  also appeared to be a chronic problem. The cystic artery was anterior to the cystic duct. The anatomy of the cystic duct & common bile duct were otherwise normal. The cholangiogram was normal, showing normal intrahepatic and extrahepatic biliary anatomy, no filling defect, and no obstruction with good flow of contrast into the duodenum. The biliary tree was quite small in caliber. The liver had a reasonable color and texture but was enlarged. There were a few adhesions in her pelvis from multiple C-sections. Otherwise the stomach, duodenum, small bowel and large bowel looked normal.  Procedure in Detail:          Following the induction of general endotracheal anesthesia the patient's abdomen was prepped and draped in a sterile fashion. Intravenous antibiotics were given and a surgical time out was performed. 0.5% Marcaine with epinephrine was used as local  infiltration anesthetic. An 11 mm Hassan trocar was inserted in the midline at the infraumbilical position with an open technique. Entry was uneventful. The  [facility] catheter was secured pursestring suture of 0 Vicryl. A  pneumoperitoneum was created. The camera was inserted. An 11 millimeter trocar placed in the subxiphoid region two 5 mm trocars placed in the right abdomen. Gallbladder fundus was identified and elevated. There were some chronic adhesions of the lower body and infundibulum which were taken down. I carefully dissected out the cystic duct and the cystic artery. I created a large window behind both of these structures until I identified 2 and only 2 structures going to the gallbladder. The cystic artery was controlled with  multiple metal clips and divided as it went on the wall of the gallbladder. The cholangiogram catheter was inserted into the cystic duct and a cholangiogram was obtained. The cholangiogram was normal as described above. The cholangiogram catheter was removed, the cystic duct and secured with 3  metal clips and divided. The gallbladder was dissected from its bed with electrocautery placed in a specimen bag removed. The bed of the gallbladder was a little bit raw and was cauterized but at the end of the case was completely hemostatic. Irrigation fluid was clear. There  was no bleeding or bile leak. All the irrigation fluid was evacuated. The trocars were removed and there was no bleeding from the trocar sites. The pneumoperitoneum was released. Fascia at the umbilicus was closed with 0 Vicryl sutures and the skin incisions closed with subcuticular sutures of 4-0 Monocryl and Dermabond. The patient tolerated the procedure well and was taken To the recovery room in good condition.Marland Kitchen EBL 15-20 cc. Counts correct. Complications none.     Angelia Mould. Derrell Lolling, M.D., FACS General and Minimally Invasive Surgery Breast and Colorectal Surgery  04/07/2013 3:00 PM

## 2013-04-07 NOTE — ED Provider Notes (Signed)
CSN: [id number]     Arrival date & time 04/07/13  0605 History   First MD Initiated Contact with Patient 04/07/13 [id number]     Chief Complaint  Patient presents with  . Abdominal Pain   (Consider location/radiation/quality/duration/timing/severity/associated sxs/prior Treatment) HPI  Pt. Presents with one week history of nausea, vomiting, and abdominal pain, primarily located in RUQ ongoing for over a week. Pain is sharp, and persistent. Associated symptoms of anorexia, took Ibuprofen with no relief, as she immediately vomited.  Denies fever, chills, headache, cp, sob, dysuria, hematuria, hematochezia or melena.  Has made multiple visits to this ED this week for similar concerns.  Has abdominal US on 10/19 which shows cholelithiasis without cholecystitis.  Discharge yesterday with pain medication from an ER visit. states she cannot afford pain medication on outpatient basis.   Past Medical History  Diagnosis Date  . Diabetes mellitus     diagnosed in 1996-always been on insulin  . Kidney stone   . Seizure disorder     started with pregnancy of first son  . Gastritis   . Bipolar 2 disorder   . Post traumatic stress disorder     "flipping out" after people close to her died  . DKA (diabetic ketoacidoses)     Recurrent admissions for DKA, medication non-complaince, poor social situation  . Heart murmur   . Arthritis     r ankle-S/P surgery 2005-11-05)  . Peripheral vascular disease     numbness bilaterally feet  . Anemia     childhood  . Depression     childhood  . Hepatitis C     biopsy in 2010-no rx-was supposed to see a hepatologist in [place].  With cirrhosis  . Neuropathy     in legs and feet and hands   Past Surgical History  Procedure Laterality Date  . Ankle surgery    . Cesarean section    . Endometrial biopsy  06/22/2012   Family History  Problem Relation Age of Onset  . Diabetes Mother     currently 57  . Fibromyalgia Mother   . Cirrhosis Father     died in 1997/11/05    History  Substance Use Topics  . Smoking status: Current Every Day Smoker -- 0.10 packs/day for 25 years    Types: Cigarettes  . Smokeless tobacco: Never Used  . Alcohol Use: No     Comment: Endorses hasn't been drinking   OB History   Grav Para Term Preterm Abortions TAB SAB Ect Mult Living   4 3 3  0 1 0 1 0 1 2     Review of Systems  All other systems reviewed and are negative.    Allergies  Penicillins  Home Medications   Current Outpatient Rx  Name  Route  Sig  Dispense  Refill  . aspirin EC 81 MG tablet   Oral   Take 81 mg by mouth daily.          . calcium carbonate (TUMS - DOSED IN MG ELEMENTAL CALCIUM) 500 MG chewable tablet   Oral   Chew 2 tablets by mouth daily as needed. For heartburn         . gabapentin (NEURONTIN) 400 MG capsule   Oral   Take 400 mg by mouth 3 (three) times daily.         Marland Kitchen glipiZIDE (GLUCOTROL) 5 MG tablet   Oral   Take 5 mg by mouth 2 (two) times daily.         Marland Kitchen  hydrochlorothiazide (MICROZIDE) 12.5 MG capsule   Oral   Take 12.5 mg by mouth daily.         . insulin glargine (LANTUS) 100 UNIT/ML injection   Subcutaneous   Inject 50 Units into the skin at bedtime.         . medroxyPROGESTERone (PROVERA) 10 MG tablet   Oral   Take 1 tablet (10 mg total) by mouth daily.   10 tablet   0   . meloxicam (MOBIC) 15 MG tablet   Oral   Take 15 mg by mouth daily.          . metFORMIN (GLUCOPHAGE) 1000 MG tablet   Oral   Take 1,000 mg by mouth 2 (two) times daily.         . metoCLOPramide (REGLAN) 10 MG tablet   Oral   Take 10 mg by mouth 3 (three) times daily before meals.         . Multiple Vitamin (MULTIVITAMIN WITH MINERALS) TABS tablet   Oral   Take 1 tablet by mouth daily.         . ranitidine (ZANTAC) 150 MG tablet   Oral   Take 150 mg by mouth 2 (two) times daily.         . risperidone (RISPERDAL) 4 MG tablet   Oral   Take 2 mg by mouth at bedtime.          . sitaGLIPtin (JANUVIA)  100 MG tablet   Oral   Take 100 mg by mouth daily.         Marland Kitchen HYDROcodone-acetaminophen (NORCO/VICODIN) 5-325 MG per tablet   Oral   Take 1 tablet by mouth every 4 (four) hours as needed for pain.   8 tablet   0   . ondansetron (ZOFRAN ODT) 8 MG disintegrating tablet   Oral   Take 1 tablet (8 mg total) by mouth every 8 (eight) hours as needed for nausea.   10 tablet   0    BP 128/75  Pulse 62  Temp(Src) 98.4 F (36.9 C) (Oral)  Resp 16  Wt 125 lb (56.7 kg)  BMI 21.45 kg/m2  SpO2 98%  LMP 04/06/2013 Physical Exam  Nursing note and vitals reviewed. Constitutional: She appears well-developed and well-nourished. No distress.  HENT:  Head: Normocephalic and atraumatic.  Eyes: Conjunctivae are normal.  Neck: Neck supple.  Cardiovascular: Normal rate and regular rhythm.   Pulmonary/Chest: Effort normal and breath sounds normal. No respiratory distress. She exhibits no tenderness.  Abdominal: Soft. There is tenderness (RUQ tenderness without guarding or rebound tenderness.  ).  Neurological: She is alert.  Mental status appears intact.  Skin: Skin is warm.  Psychiatric: She has a normal mood and affect.    ED Course  Procedures (including critical care time)  7:27 AM Pt with recurrent ER visits for RUQ tenderness, with evidence of cholelithiasis.  She is here with worsening pain.  Is afebrile, VSS.  Bedside US performed by Dr. Jodi Mourning indicate gallstone near neck of gall bladder without obvious evidence of cholecystitis.  Plan to obtain labs, treat sxs and will consult General Surgery for further care.  Pt hasn't eat any food.    10:36 AM [facility] specialist has been consult and has seen patient. Plan to offer surgical intervention today for further management. Patient agrees with plan.  Labs Review Labs Reviewed  CBC WITH DIFFERENTIAL - Abnormal; Notable for the following:    Hemoglobin 11.6 (*)  HCT 35.8 (*)    MCV 70.6 (*)    MCH 22.9 (*)    All  other components within normal limits  COMPREHENSIVE METABOLIC PANEL - Abnormal; Notable for the following:    Sodium 134 (*)    Chloride 95 (*)    Glucose, Bld 236 (*)    AST 53 (*)    ALT 52 (*)    All other components within normal limits  LIPASE, BLOOD  PREGNANCY, URINE  CG4 I-STAT (LACTIC ACID)   Imaging Review No results found.  EKG Interpretation     Ventricular Rate:  58 PR Interval:  98 QRS Duration: 71 QT Interval:  459 QTC Calculation: 451 R Axis:   58 Text Interpretation:  Sinus rhythm, mild prolonged QTc, no signs of ischemia            MDM   1. Cholelithiasis    BP 144/72  Pulse 62  Temp(Src) 98.2 F (36.8 C) (Oral)  Resp 12  Wt 125 lb (56.7 kg)  BMI 21.45 kg/m2  SpO2 97%  LMP 04/06/2013  I have reviewed nursing notes and vital signs.  I reviewed available ER/hospitalization records thought the EMR     Fayrene Helper, [place] 04/07/13 1037

## 2013-04-08 ENCOUNTER — Encounter (HOSPITAL_COMMUNITY): Payer: Self-pay | Admitting: General Surgery

## 2013-04-08 LAB — GLUCOSE, CAPILLARY
Glucose-Capillary: 188 mg/dL — ABNORMAL HIGH (ref 70–99)
Glucose-Capillary: 133 mg/dL — ABNORMAL HIGH (ref 70–99)
Glucose-Capillary: 149 mg/dL — ABNORMAL HIGH (ref 70–99)

## 2013-04-08 LAB — URINE CULTURE: Colony Count: 9000

## 2013-04-08 MED ORDER — HYDROCODONE-ACETAMINOPHEN 10-325 MG PO TABS: 1.0000 | ORAL_TABLET | Freq: Once | ORAL | Status: DC

## 2013-04-08 MED ORDER — ACETAMINOPHEN 325 MG PO TABS: 650.0000 mg | ORAL_TABLET | Freq: Four times a day (QID) | ORAL | Status: DC | PRN

## 2013-04-08 MED ORDER — HYDROCODONE-ACETAMINOPHEN 5-325 MG PO TABS: 1.0000 | ORAL_TABLET | ORAL | Status: DC | PRN

## 2013-04-08 MED ORDER — GLUCOSE BLOOD VI STRP: ORAL_STRIP | Status: DC

## 2013-04-08 NOTE — Progress Notes (Signed)
Assessment unchanged. Pain low at "3 to 4 out of 10." Scripts x 2 given as provided by MD. Pt smiling with friends at bedside. Verbalized understanding of dc instructions through teach back. Familiar with My Chart with plans to sign up soon. Both case manager and diabetes care coordinator saw pt prior to dc regarding glucose monitoring strips per Dr. Charlean Sanfilippo request. Pt to follow up with PCP today after leaving hospital. Pt received bus passes and friends are present to assist pt with transition to PCP office and then to home via bus. Discharged via wc accompanied by Sec/NT and friends x3 to ED to retrieve personal items from Security office then to bus stop.

## 2013-04-08 NOTE — Progress Notes (Signed)
Physician Discharge Summary  Patient ID: Mary [name] MRN: [id number] DOB/AGE: [birth year] 42 y.o.  Admit date: 04/07/2013 Discharge date: 04/08/2013  Admission Diagnoses: Symptomatic cholelithiasis  Discharge Diagnoses: Subacute and chronic cholecystitis with cholelithiasis  Principal Problem:   Symptomatic cholelithiasis Active Problems:   Diabetes mellitus type 1.5, managed as type 2   Bipolar 2 disorder   Tobacco abuse   Seizure disorder   DUB (dysfunctional uterine bleeding)   PROCEDURES: Laparoscopic cholecystectomy with cholangiogram   Hospital Course: This is a 42 year old Afro-American female with bipolar disorder, seizure disorder, insulin-dependent diabetes mellitus, history of DKA, and hepatitis C. She presents with a three-week history of right upper quadrant pain nausea and vomiting. Right blood cell count is normal. Liver function tests are mildly elevated. Ultrasound shows multiple gallstones but no thickening. She has had repeated visits to the emergency room recently. She is admitted and brought to the operating room for cholecystectomy She tolerated the procedure well and was transferred to the floor.  She did well post op.  Medicine saw her and HBA1C was elevated to 10.4.  She is on all the right medicines, but she eats fast food most of the time and as a result her glucose is poorly controlled.  She does not have money for strips to do her sugars at home, nor buy medicines/pain pills till tomorrow. she was seem bu the diabetes coordinator and was ready for d/c by the afternoon of her 1st post op day.  Condition on D/C:  Improved    Disposition: 01-Home or Self Care   Future Appointments Provider Department Dept Phone   04/15/2013 1:45 PM Adam Phenix, MD [facility] [phone]       Medication List         acetaminophen 325 MG tablet  Commonly known as:  TYLENOL  Take 2 tablets (650 mg total) by mouth every 6 (six) hours as needed.      aspirin EC 81 MG tablet  Take 81 mg by mouth daily.     calcium carbonate 500 MG chewable tablet  Commonly known as:  TUMS - dosed in mg elemental calcium  Chew 2 tablets by mouth daily as needed. For heartburn     gabapentin 400 MG capsule  Commonly known as:  NEURONTIN  Take 400 mg by mouth 3 (three) times daily.     glipiZIDE 5 MG tablet  Commonly known as:  GLUCOTROL  Take 5 mg by mouth 2 (two) times daily.     glucose blood test strip  Use as instructed     hydrochlorothiazide 12.5 MG capsule  Commonly known as:  MICROZIDE  Take 12.5 mg by mouth daily.     HYDROcodone-acetaminophen 5-325 MG per tablet  Commonly known as:  NORCO/VICODIN  Take 1 tablet by mouth every 4 (four) hours as needed for pain. One of these prescription was never filled.     HYDROcodone-acetaminophen 5-325 MG per tablet  Commonly known as:  NORCO/VICODIN  Take 1-2 tablets by mouth every 4 (four) hours as needed.     insulin glargine 100 UNIT/ML injection  Commonly known as:  LANTUS  Inject 50 Units into the skin at bedtime.     medroxyPROGESTERone 10 MG tablet  Commonly known as:  PROVERA  Take 1 tablet (10 mg total) by mouth daily.     meloxicam 15 MG tablet  Commonly known as:  MOBIC  Take 15 mg by mouth daily.     metFORMIN 1000 MG tablet  Commonly known as:  GLUCOPHAGE  Take 1,000 mg by mouth 2 (two) times daily.     metoCLOPramide 10 MG tablet  Commonly known as:  REGLAN  Take 10 mg by mouth 3 (three) times daily before meals.     multivitamin with minerals Tabs tablet  Take 1 tablet by mouth daily.     ondansetron 8 MG disintegrating tablet  Commonly known as:  ZOFRAN ODT  Take 1 tablet (8 mg total) by mouth every 8 (eight) hours as needed for nausea.     ranitidine 150 MG tablet  Commonly known as:  ZANTAC  Take 150 mg by mouth 2 (two) times daily.     risperidone 4 MG tablet  Commonly known as:  RISPERDAL  Take 2 mg by mouth at bedtime.     sitaGLIPtin 100 MG  tablet  Commonly known as:  JANUVIA  Take 100 mg by mouth daily.           Follow-up Information   Follow up with Ernestene Mention, MD. Schedule an appointment as soon as possible for a visit in 3 weeks.   Specialty:  General Surgery   Contact information:   [address] [place] [address] [phone]       Follow up with STEELE,ANTHONY, FNP. (Follow up on your appointment.  Let them know your Hemoglobin A1C was 10.4.)    Specialty:  Nurse Practitioner      Signed: Sherrie George 04/08/2013, 4:41 PM
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
## 2013-04-08 NOTE — Progress Notes (Signed)
1 Day Post-Op  Subjective: Eating breakfast with eggs and bacon.  Sore, but overall looks good.  Bus is her only current means of getting home.  Objective: Vital signs in last 24 hours: Temp:  [97.5 F (36.4 C)-98.3 F (36.8 C)] 98.2 F (36.8 C) (10/23 1027) Pulse Rate:  [59-78] 68 (10/23 0608) Resp:  [3-23] 16 (10/23 0608) BP: (138-167)/(69-95) 155/90 mmHg (10/23 0608) SpO2:  [97 %-100 %] 100 % (10/23 2536) Weight:  [56.7 kg (125 lb)] 56.7 kg (125 lb) (10/22 1728) Last BM Date:  (pt states sometime last week) 480 PO last PM Diet: carb modified Afebrile, BP up some Glucose OK  HbA1C-10.4 IOC WAS NORMAL post op Intake/Output from previous day: 10/22 0701 - 10/23 0700 In: 2965 [P.O.:480; I.V.:2485] Out: 1230 [Urine:1200; Blood:30] Intake/Output this shift:    General appearance: alert, cooperative and no distress GI: soft, tender, incisions all look good.    Lab Results:   Recent Labs  04/06/13 1015 04/07/13 0721  WBC 7.4 9.7  HGB 12.3 11.6*  HCT 37.9 35.8*  PLT 154 226    BMET  Recent Labs  04/06/13 1039 04/07/13 0721  NA 136 134*  K 3.5 3.7  CL 101 95*  CO2 24 25  GLUCOSE 256* 236*  BUN 12 13  CREATININE 0.52 0.60  CALCIUM 9.1 9.8   PT/INR No results found for this basename: LABPROT, INR,  in the last 72 hours   Recent Labs Lab 04/01/13 2231 04/04/13 1953 04/06/13 1039 04/07/13 0721  AST 57* 67* 48* 53*  ALT 48* 58* 48* 52*  ALKPHOS 80 69 62 69  BILITOT 0.6 0.2* 0.3 0.4  PROT 7.9 6.9 6.8 7.5  ALBUMIN 3.9 3.5 3.4* 3.8     Lipase     Component Value Date/Time   LIPASE 21 04/07/2013 0721     Studies/Results: Dg Cholangiogram Operative  04/07/2013   CLINICAL DATA:  cholelithiasis  EXAM: INTRAOPERATIVE CHOLANGIOGRAM  TECHNIQUE: Cholangiographic images from the C-arm fluoroscopic device were submitted for interpretation post-operatively. Please see the procedural report for the amount of contrast and the fluoroscopy time utilized.   COMPARISON:  None.  FINDINGS: The gallbladder has been removed, and the cystic duct has been cannulated. The intrahepatic and extrahepatic biliary ducts appear normal. There is no mass or calculus. There is apparent free flow of contrast via the common bile duct into the duodenum.  IMPRESSION: No abnormality noted.  No obstructing lesion.   Electronically Signed   By: Bretta Bang M.D.   On: 04/07/2013 15:35    Medications: . aspirin EC  81 mg Oral Daily  . docusate sodium  100 mg Oral BID  . enoxaparin (LOVENOX) injection  40 mg Subcutaneous Q24H  . gabapentin  400 mg Oral TID  . hydrochlorothiazide  12.5 mg Oral Daily  . insulin aspart  0-15 Units Subcutaneous TID WC  . insulin glargine  25 Units Subcutaneous QHS  . linagliptin  5 mg Oral Daily  . medroxyPROGESTERone  10 mg Oral Daily  . meloxicam  15 mg Oral Daily  . metFORMIN  1,000 mg Oral BID WC  . metoCLOPramide  10 mg Oral TID AC  . pantoprazole (PROTONIX) IV  40 mg Intravenous QHS  . risperidone  2 mg Oral QHS  . senna  2 tablet Oral QHS    Assessment/Plan 1. Cholelithiasis with abdominal pain  2. Bipolar/depression/PTSD/anxiety  3. AODM/ID/hx of DKA poor compliance (HBA1C 10.4) 4. Hepatitis C  5. Seizure disorder, she cannot  remember last seizure  6. Tobacco use  7. Chronic back pain and bilateral lower extremity/upper extremity pain  8. Neuropathy   Plan:  Appreciate Dr. Joan Mayans assistance.  I will get her up and around this AM.  Aim to send home after lunch if she is OK and Dr. Malachi Bonds agree's.  Follow up with Dr. Derrell Lolling in about 3 weeks. I will ask case management to help with getting her home.  She says she see's PCP monthly and has an appointment end of  This month.  LOS: 1 day    Mary [name] 04/08/2013

## 2013-04-08 NOTE — Care Management Note (Signed)
    Page 1 of 1   04/08/2013     12:02:37 PM   CARE MANAGEMENT NOTE 04/08/2013  Patient:  Mary [name], Mary [name]   Account Number:  [id number]  Date Initiated:  04/08/2013  Documentation initiated by:  Lorenda Ishihara  Subjective/Objective Assessment:   42 yo female admitted s/p lap chole. PTA lived at home alone.     Action/Plan:   Home when stable   Anticipated DC Date:  04/08/2013   Anticipated DC Plan:  HOME/SELF CARE  In-house referral  Clinical Social Worker      DC Planning Services  CM consult      Choice offered to / List presented to:             Status of service:  Completed, signed off Medicare Important Message given?   (If response is "NO", the following Medicare IM given date fields will be blank) Date Medicare IM given:   Date Additional Medicare IM given:    Discharge Disposition:  HOME/SELF CARE  Per UR Regulation:  Reviewed for med. necessity/level of care/duration of stay  If discussed at Long Length of Stay Meetings, dates discussed:    Comments:  04-08-13 Lorenda Ishihara RN CM 1100 Spoke with patient at bedside, requesting assistance obtaining strips for glucometer. Patient recieved glucometer from [facility] of the [place], contacted them at [phone] and spoke with Selena Batten. She will f/u with patient to assist with getting her strips. Patient also provided bus pass from CSW to assist with transportation at d/c.

## 2013-04-08 NOTE — ED Provider Notes (Signed)
Medical screening examination/treatment/procedure(s) were conducted as a shared visit with non-physician practitioner(s) or resident and myself. I personally evaluated the patient during the encounter and agree with the findings and plan unless otherwise indicated. I have personally reviewed any xrays and/ or EKG's with the provider and I agree with interpretation.  Recurrent RUQ pain, cannot tolerate fluids. Pt unable to get a ride to surgery office, cannot afford and cannot control her pain. Bedside US done by myself shows gallstone near GB neck, no fluid/ thickening.  Labs, pain meds and admission to surgery for symptomatic cholelithiasis.  Abd RUQ pain on exam, no guarding, soft, RRR, no scleral icterus.  EMERGENCY DEPARTMENT BILIARY ULTRASOUND INTERPRETATION  "Study: Limited Abdominal Ultrasound of the gallbladder and common bile duct."  INDICATIONS: Abdominal pain, RUQ pain and Nausea  Indication: Multiple views of the gallbladder and common bile duct were obtained in real-time with a Multi-frequency probe."  PERFORMED BY: Myself  IMAGES ARCHIVED?: Yes  FINDINGS: Gallstones present, Gallbladder wall normal in thickness, Sonographic Murphy's sign present and Common bile duct normal in size  LIMITATIONS: Bowel Gas  INTERPRETATION: Cholelithiasis  The patients results and plan were reviewed and discussed.  Any x-rays performed were personally reviewed by myself.  Differential diagnosis were considered with the presenting HPI.  Diagnosis: Biliary Colic, Abdominal pain  EKG: pre op  Admission/ observation were discussed with the admitting physician, patient and/or family and they are comfortable with the plan.    Enid Skeens, MD 04/08/13 [phone]

## 2013-04-08 NOTE — Progress Notes (Signed)
I have personally interviewed and examined this patient this morning. I agree with the assessment and treatment plan outlined by Mr. Marlyne Beards, [place].  She looks like she should be good to go home later on today. Abdominal exam is fairly benign and wounds looked good.   Angelia Mould. Derrell Lolling, M.D., [facility] General and Minimally invasive Surgery Breast and Colorectal Surgery Office:   [phone] Pager:   [phone]

## 2013-04-08 NOTE — Progress Notes (Signed)
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 Last A1c 13.2 03/13/2013. - Continue metformin 1000mg  BID  - Continue tradjenta  - had a long discussion with her about her diabetes. She is quite non compliant with her diet and eats mostly fast foods which occasionally results in her having DKA. There is also some reported non-compliance with her medications. In the light of her sugars in the hospital I would recommend that she is being discharged on 25 U of Lantus as well as her oral agents. Extensively counseled for a low carb diabetic diet today but I fear that she is a high risk for re-admission. She also states that she currently does not have any glucose strips at home and is scheduled to see her FNP next week for some strips. I contacted CM to help with her having some resources as she is on insulin and its quite important that she is able to check her sugars at home. She is considering buying over the counter strips and I strongly encouraged that.  Bipolar 2 disorder, moderately controlled  - Continue risperidone QHS  HTN, blood pressure elevated  - Continue HCTZ and trend BP; somewhat related to her having some pain. Advised close follow up with her PCP next week.  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 She will discuss with PCP  Gastritis, stable. Continue PPI  DUB, stable. Continue provera  Seizure d/o, currently seizure free on no medications.  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  - Consider outpatient hepatology referral for possible treatment  She would be OK to  go home later today once she talks to the CM.  HPI/Subjective: - feeling better, "sore" after her surgery.   Objective: Filed Vitals:   04/07/13 1945 04/07/13 2155 04/08/13 0149 04/08/13 0608  BP: 152/87 160/95 138/81 155/90  Pulse: 63 61 59 68  Temp: 98.3 F (36.8 C) 98.3 F (36.8 C) 97.9 F (36.6 C) 98.2 F (36.8 C)  TempSrc: Oral Oral Oral Oral  Resp: 16 16 16 16   Height:      Weight:      SpO2: 100% 100% 100% 100%    Intake/Output Summary (Last 24 hours) at 04/08/13 0945 Last data filed at 04/08/13 6962  Gross per 24 hour  Intake   2965 ml  Output    830 ml  Net   2135 ml   Filed Weights   04/07/13 0609 04/07/13 1728  Weight: 56.7 kg (125 lb) 56.7 kg (125 lb)    Exam:  General:  NAD  Cardiovascular: regular rate and rhythm, without MRG  Respiratory: good air movement, clear to auscultation throughout, no wheezing, ronchi or rales  Abdomen: soft, mildly tender to palpation, positive bowel sounds  MSK: no peripheral edema  Neuro: CN 2-12 grossly intact, MS 5/5 in all 4  Data Reviewed: Basic Metabolic Panel:  Recent Labs Lab 04/01/13 2231 04/02/13 0645 04/04/13 1953 04/06/13 1039 04/07/13 0721  NA 133*  --  137 136 134*  K 3.6  --  3.5 3.5 3.7  CL 97  --  104 101 95*  CO2 23  --  23 24 25   GLUCOSE 203*  --  117* 256* 236*  BUN 19  --  13 12 13   CREATININE 0.53 0.49* 0.45* 0.52 0.60  CALCIUM 9.7  --  9.2 9.1 9.8   Liver Function Tests:  Recent Labs Lab 04/01/13 2231 04/04/13 1953 04/06/13 1039 04/07/13 0721  AST 57* 67* 48* 53*  ALT 48* 58* 48* 52*  ALKPHOS 80 69 62 69  BILITOT 0.6 0.2* 0.3 0.4  PROT 7.9 6.9 6.8 7.5  ALBUMIN 3.9 3.5 3.4* 3.8    Recent Labs Lab 04/01/13 2231 04/02/13 1350 04/04/13 1953 04/06/13 1039 04/07/13 0721  LIPASE 30  --  20 22 21   AMYLASE 211* 100  --   --   --    CBC:  Recent Labs Lab 04/01/13 2231 04/02/13 0645 04/04/13 1953 04/06/13 1015 04/07/13 0721  WBC 11.3* 8.4 12.0* 7.4 9.7   NEUTROABS 7.3  --  8.7*  --  6.2  HGB 11.9* 10.6* 11.8* 12.3 11.6*  HCT 37.0 33.0* 36.7 37.9 35.8*  MCV 70.9* 71.7* 70.8* 70.7* 70.6*  PLT 185 181 164 154 226   CBG:  Recent Labs Lab 04/07/13 1245 04/07/13 1538 04/07/13 1713 04/07/13 2205 04/08/13 0752  GLUCAP 188* 166* 170* 128* 133*   Studies: Dg Cholangiogram Operative  04/07/2013   CLINICAL DATA:  cholelithiasis  EXAM: INTRAOPERATIVE CHOLANGIOGRAM  TECHNIQUE: Cholangiographic images from the C-arm fluoroscopic device were submitted for interpretation post-operatively. Please see the procedural report for the amount of contrast and the fluoroscopy time utilized.  COMPARISON:  None.  FINDINGS: The gallbladder has been removed, and the cystic duct has been cannulated. The intrahepatic and extrahepatic biliary ducts appear normal. There is no mass or calculus. There is apparent free flow of contrast via the common bile duct into the duodenum.  IMPRESSION: No abnormality noted.  No obstructing lesion.   Electronically Signed   By: Bretta Bang M.D.   On: 04/07/2013 15:35   Scheduled Meds: . aspirin EC  81 mg Oral Daily  . docusate sodium  100 mg Oral BID  . enoxaparin (LOVENOX) injection  40 mg Subcutaneous Q24H  . gabapentin  400 mg Oral TID  . hydrochlorothiazide  12.5 mg Oral Daily  . insulin aspart  0-15 Units Subcutaneous TID WC  . insulin glargine  25 Units Subcutaneous QHS  . linagliptin  5 mg Oral Daily  . medroxyPROGESTERone  10 mg Oral Daily  . meloxicam  15 mg Oral Daily  . metFORMIN  1,000 mg Oral BID WC  . metoCLOPramide  10 mg Oral TID AC  . pantoprazole (PROTONIX) IV  40 mg Intravenous QHS  . risperidone  2 mg Oral QHS  . senna  2 tablet Oral QHS   Continuous Infusions:  Principal Problem:   Symptomatic cholelithiasis Active Problems:   Bipolar 2 disorder   Seizure disorder   Tobacco abuse   DUB (dysfunctional uterine bleeding)   Diabetes mellitus type 1.5, managed as type 2  Time spent:  68  Pamella Pert, MD [facility] Pager [phone]. If 7 PM - 7 AM, please contact night-coverage at [email or web address], password [facility] 04/08/2013, 9:45 AM  LOS: 1 day

## 2013-04-09 NOTE — Progress Notes (Signed)
I have personally interviewed and examined this patient this morning. She is doing well and is ready for discharge. I agree with the assessment, treatment plan, and discharge management and followup as outlined by Mr. Marlyne Beards, [place].   Angelia Mould. Derrell Lolling, M.D., [facility] General and Minimally invasive Surgery Breast and Colorectal Surgery Office:   [phone] Pager:   [phone]

## 2013-04-10 NOTE — Discharge Summary (Signed)
Sherrie George, PA-C Physician Assistant Signed Surgery Progress Notes Service date: 04/08/2013 4:40 PM  Physician Discharge Summary   Patient ID: HELYNE [name] MRN: [id number] DOB/AGE: 02/03/1971 42 y.o.  Admit date: 04/07/2013 Discharge date: 04/08/2013  Admission Diagnoses: Symptomatic cholelithiasis  Discharge Diagnoses: Subacute and chronic cholecystitis with cholelithiasis  Principal Problem:   Symptomatic cholelithiasis Active Problems:   Diabetes mellitus type 1.5, managed as type 2   Bipolar 2 disorder   Tobacco abuse   Seizure disorder   DUB (dysfunctional uterine bleeding)   PROCEDURES: Laparoscopic cholecystectomy with cholangiogram   Hospital Course: This is a 42 year old Afro-American female with bipolar disorder, seizure disorder, insulin-dependent diabetes mellitus, history of DKA, and hepatitis C. She presents with a three-week history of right upper quadrant pain nausea and vomiting. Right blood cell count is normal. Liver function tests are mildly elevated. Ultrasound shows multiple gallstones but no thickening. She has had repeated visits to the emergency room recently. She is admitted and brought to the operating room for cholecystectomy She tolerated the procedure well and was transferred to the floor.  She did well post op.  Medicine saw her and HBA1C was elevated to 10.4.  She is on all the right medicines, but she eats fast food most of the time and as a result her glucose is poorly controlled.  She does not have money for strips to do her sugars at home, nor buy medicines/pain pills till tomorrow. she was seem bu the diabetes coordinator and was ready for d/c by the afternoon of her 1st post op day.  Condition on D/C:  Improved    Disposition: 01-Home or Self Care      Future Appointments  Provider  Department  Dept Phone     04/15/2013 1:45 PM  Adam Phenix, MD  [facility]  [phone]          Medication List             acetaminophen 325 MG tablet   Commonly known as:  TYLENOL   Take 2 tablets (650 mg total) by mouth every 6 (six) hours as needed.        aspirin EC 81 MG tablet   Take 81 mg by mouth daily.        calcium carbonate 500 MG chewable tablet   Commonly known as:  TUMS - dosed in mg elemental calcium   Chew 2 tablets by mouth daily as needed. For heartburn        gabapentin 400 MG capsule   Commonly known as:  NEURONTIN   Take 400 mg by mouth 3 (three) times daily.        glipiZIDE 5 MG tablet   Commonly known as:  GLUCOTROL   Take 5 mg by mouth 2 (two) times daily.        glucose blood test strip   Use as instructed        hydrochlorothiazide 12.5 MG capsule   Commonly known as:  MICROZIDE   Take 12.5 mg by mouth daily.        HYDROcodone-acetaminophen 5-325 MG per tablet   Commonly known as:  NORCO/VICODIN   Take 1 tablet by mouth every 4 (four) hours as needed for pain. One of these prescription was never filled.        HYDROcodone-acetaminophen 5-325 MG per tablet   Commonly known as:  NORCO/VICODIN   Take 1-2 tablets by mouth every 4 (four) hours as needed.  insulin glargine 100 UNIT/ML injection   Commonly known as:  LANTUS   Inject 50 Units into the skin at bedtime.        medroxyPROGESTERone 10 MG tablet   Commonly known as:  PROVERA   Take 1 tablet (10 mg total) by mouth daily.        meloxicam 15 MG tablet   Commonly known as:  MOBIC   Take 15 mg by mouth daily.        metFORMIN 1000 MG tablet   Commonly known as:  GLUCOPHAGE   Take 1,000 mg by mouth 2 (two) times daily.        metoCLOPramide 10 MG tablet   Commonly known as:  REGLAN   Take 10 mg by mouth 3 (three) times daily before meals.        multivitamin with minerals Tabs tablet   Take 1 tablet by mouth daily.        ondansetron 8 MG disintegrating tablet   Commonly known as:  ZOFRAN ODT   Take 1 tablet (8 mg total) by mouth every 8 (eight) hours as needed for nausea.        ranitidine  150 MG tablet   Commonly known as:  ZANTAC   Take 150 mg by mouth 2 (two) times daily.        risperidone 4 MG tablet   Commonly known as:  RISPERDAL   Take 2 mg by mouth at bedtime.        sitaGLIPtin 100 MG tablet   Commonly known as:  JANUVIA   Take 100 mg by mouth daily.                  Follow-up Information     Follow up with Ernestene Mention, MD. Schedule an appointment as soon as possible for a visit in 3 weeks.     Specialty:  General Surgery     Contact information:     [address]  [address] [place] [address] [phone]           Follow up with STEELE,ANTHONY, FNP. (Follow up on your appointment.  Let them know your Hemoglobin A1C was 10.4.)      Specialty:  Nurse Practitioner        Signed: Sherrie George 04/08/2013, 4:41 PM

## 2013-04-11 NOTE — Discharge Summary (Addendum)
I have personally interviewed and examined this patient today, and  I agree with the assessment and discharge planning and followup outlined by Mr. Marlyne Beards, [place].   Angelia Mould. Derrell Lolling, M.D., [facility] General and Minimally invasive Surgery Breast and Colorectal Surgery Office:   [phone] Pager:   [phone]

## 2013-04-15 ENCOUNTER — Ambulatory Visit (INDEPENDENT_AMBULATORY_CARE_PROVIDER_SITE_OTHER): Payer: No Typology Code available for payment source | Admitting: Obstetrics & Gynecology

## 2013-04-15 VITALS — BP 109/76 | HR 106 | Temp 98.1°F | Ht 64.0 in | Wt 126.0 lb

## 2013-04-15 DIAGNOSIS — N938 Other specified abnormal uterine and vaginal bleeding: Secondary | ICD-10-CM | POA: Insufficient documentation

## 2013-04-15 DIAGNOSIS — N949 Unspecified condition associated with female genital organs and menstrual cycle: Secondary | ICD-10-CM

## 2013-04-15 MED ORDER — MEDROXYPROGESTERONE ACETATE 10 MG PO TABS: 20.0000 mg | ORAL_TABLET | Freq: Every day | ORAL | Status: DC

## 2013-04-15 NOTE — Patient Instructions (Signed)

## 2013-04-20 ENCOUNTER — Encounter (HOSPITAL_COMMUNITY): Payer: Self-pay | Admitting: Emergency Medicine

## 2013-04-20 ENCOUNTER — Emergency Department (HOSPITAL_COMMUNITY)
Admission: EM | Admit: 2013-04-20 | Discharge: 2013-04-20 | Disposition: A | Discharge status: Home / Self Care | Payer: No Typology Code available for payment source | Attending: Emergency Medicine | Admitting: Emergency Medicine

## 2013-04-20 ENCOUNTER — Emergency Department (HOSPITAL_COMMUNITY): Payer: No Typology Code available for payment source

## 2013-04-20 DIAGNOSIS — F3189 Other bipolar disorder: Secondary | ICD-10-CM | POA: Insufficient documentation

## 2013-04-20 DIAGNOSIS — R011 Cardiac murmur, unspecified: Secondary | ICD-10-CM | POA: Insufficient documentation

## 2013-04-20 DIAGNOSIS — E1142 Type 2 diabetes mellitus with diabetic polyneuropathy: Secondary | ICD-10-CM | POA: Insufficient documentation

## 2013-04-20 DIAGNOSIS — Z862 Personal history of diseases of the blood and blood-forming organs and certain disorders involving the immune mechanism: Secondary | ICD-10-CM | POA: Insufficient documentation

## 2013-04-20 DIAGNOSIS — Z794 Long term (current) use of insulin: Secondary | ICD-10-CM | POA: Insufficient documentation

## 2013-04-20 DIAGNOSIS — Z3202 Encounter for pregnancy test, result negative: Secondary | ICD-10-CM | POA: Insufficient documentation

## 2013-04-20 DIAGNOSIS — Z7982 Long term (current) use of aspirin: Secondary | ICD-10-CM | POA: Insufficient documentation

## 2013-04-20 DIAGNOSIS — R112 Nausea with vomiting, unspecified: Secondary | ICD-10-CM | POA: Insufficient documentation

## 2013-04-20 DIAGNOSIS — R1012 Left upper quadrant pain: Secondary | ICD-10-CM | POA: Insufficient documentation

## 2013-04-20 DIAGNOSIS — R630 Anorexia: Secondary | ICD-10-CM | POA: Insufficient documentation

## 2013-04-20 DIAGNOSIS — G40909 Epilepsy, unspecified, not intractable, without status epilepticus: Secondary | ICD-10-CM | POA: Insufficient documentation

## 2013-04-20 DIAGNOSIS — R739 Hyperglycemia, unspecified: Secondary | ICD-10-CM

## 2013-04-20 DIAGNOSIS — Z9089 Acquired absence of other organs: Secondary | ICD-10-CM | POA: Insufficient documentation

## 2013-04-20 DIAGNOSIS — F4310 Post-traumatic stress disorder, unspecified: Secondary | ICD-10-CM | POA: Insufficient documentation

## 2013-04-20 DIAGNOSIS — Z791 Long term (current) use of non-steroidal anti-inflammatories (NSAID): Secondary | ICD-10-CM | POA: Insufficient documentation

## 2013-04-20 DIAGNOSIS — Z8619 Personal history of other infectious and parasitic diseases: Secondary | ICD-10-CM | POA: Insufficient documentation

## 2013-04-20 DIAGNOSIS — Z880 Allergy status to penicillin: Secondary | ICD-10-CM | POA: Insufficient documentation

## 2013-04-20 DIAGNOSIS — M19079 Primary osteoarthritis, unspecified ankle and foot: Secondary | ICD-10-CM | POA: Insufficient documentation

## 2013-04-20 DIAGNOSIS — F17200 Nicotine dependence, unspecified, uncomplicated: Secondary | ICD-10-CM | POA: Insufficient documentation

## 2013-04-20 DIAGNOSIS — Z79899 Other long term (current) drug therapy: Secondary | ICD-10-CM | POA: Insufficient documentation

## 2013-04-20 DIAGNOSIS — E1049 Type 1 diabetes mellitus with other diabetic neurological complication: Secondary | ICD-10-CM | POA: Insufficient documentation

## 2013-04-20 DIAGNOSIS — Z87442 Personal history of urinary calculi: Secondary | ICD-10-CM | POA: Insufficient documentation

## 2013-04-20 DIAGNOSIS — R109 Unspecified abdominal pain: Secondary | ICD-10-CM | POA: Insufficient documentation

## 2013-04-20 DIAGNOSIS — R1013 Epigastric pain: Secondary | ICD-10-CM | POA: Insufficient documentation

## 2013-04-20 LAB — GLUCOSE, CAPILLARY
Glucose-Capillary: 395 mg/dL — ABNORMAL HIGH (ref 70–99)
Glucose-Capillary: 361 mg/dL — ABNORMAL HIGH (ref 70–99)

## 2013-04-20 LAB — BLOOD GAS, VENOUS
Bicarbonate: 24.2 mEq/L — ABNORMAL HIGH (ref 20.0–24.0)
Patient temperature: 98.6
TCO2: 22.9 mmol/L (ref 0–100)
pH, Ven: 7.351 — ABNORMAL HIGH (ref 7.250–7.300)
pO2, Ven: 0 mmHg — CL (ref 30.0–45.0)

## 2013-04-20 LAB — COMPREHENSIVE METABOLIC PANEL
ALT: 38 U/L — ABNORMAL HIGH (ref 0–35)
AST: 42 U/L — ABNORMAL HIGH (ref 0–37)
Albumin: 4.6 g/dL (ref 3.5–5.2)
Alkaline Phosphatase: 103 U/L (ref 39–117)
Chloride: 98 mEq/L (ref 96–112)
Creatinine, Ser: 0.68 mg/dL (ref 0.50–1.10)
GFR calc non Af Amer: >90 mL/min (ref >90)
Potassium: 4.2 mEq/L (ref 3.5–5.1)
Total Bilirubin: 0.3 mg/dL (ref 0.3–1.2)
Total Protein: 9.2 g/dL — ABNORMAL HIGH (ref 6.0–8.3)

## 2013-04-20 LAB — URINALYSIS, ROUTINE W REFLEX MICROSCOPIC
Bilirubin Urine: NEGATIVE
Hgb urine dipstick: NEGATIVE
Ketones, ur: 40 mg/dL — AB
Leukocytes, UA: NEGATIVE
Nitrite: NEGATIVE
Protein, ur: NEGATIVE mg/dL
Specific Gravity, Urine: 1.038 — ABNORMAL HIGH (ref 1.005–1.030)
Urobilinogen, UA: 0.2 mg/dL (ref 0.0–1.0)
pH: 6 (ref 5.0–8.0)

## 2013-04-20 LAB — CBC WITH DIFFERENTIAL/PLATELET
Eosinophils Relative: 1 % (ref 0–5)
Lymphocytes Relative: 21 % (ref 12–46)
Monocytes Relative: 1 % — ABNORMAL LOW (ref 3–12)
Neutrophils Relative %: 77 % (ref 43–77)
Platelets: 249 10*3/uL (ref 150–400)
RBC: 5.78 MIL/uL — ABNORMAL HIGH (ref 3.87–5.11)
WBC: 11 10*3/uL — ABNORMAL HIGH (ref 4.0–10.5)

## 2013-04-20 LAB — URINE MICROSCOPIC-ADD ON

## 2013-04-20 LAB — POCT PREGNANCY, URINE: Preg Test, Ur: NEGATIVE

## 2013-04-20 MED ORDER — SODIUM CHLORIDE 0.9 % IV BOLUS (SEPSIS)
1000.0000 mL | Freq: Once | INTRAVENOUS | Status: AC
  Administered 2013-04-20: 1000 mL via INTRAVENOUS

## 2013-04-20 MED ORDER — INSULIN ASPART 100 UNIT/ML ~~LOC~~ SOLN
10.0000 [IU] | Freq: Once | SUBCUTANEOUS | Status: AC
  Administered 2013-04-20: 10 [IU] via INTRAVENOUS
  Filled 2013-04-20: qty 1

## 2013-04-20 MED ORDER — HYDROMORPHONE HCL PF 1 MG/ML IJ SOLN: 1.0000 mg | Freq: Once | INTRAMUSCULAR | Status: DC

## 2013-04-20 MED ORDER — IOHEXOL 300 MG/ML  SOLN
100.0000 mL | Freq: Once | INTRAMUSCULAR | Status: AC | PRN
  Administered 2013-04-20: 100 mL via INTRAVENOUS

## 2013-04-20 MED ORDER — ONDANSETRON HCL 4 MG/2ML IJ SOLN
4.0000 mg | Freq: Once | INTRAMUSCULAR | Status: AC
  Administered 2013-04-20: 4 mg via INTRAVENOUS
  Filled 2013-04-20: qty 2

## 2013-04-20 MED ORDER — HYDROMORPHONE HCL PF 1 MG/ML IJ SOLN
1.0000 mg | Freq: Once | INTRAMUSCULAR | Status: AC
  Administered 2013-04-20: 1 mg via INTRAVENOUS
  Filled 2013-04-20: qty 1

## 2013-04-20 NOTE — Progress Notes (Signed)
[facility] spoke with patient, who has a [employer]. Patient PCP is [facility] at [place]. Was able to set patient up with a follow-up apt with PCP on 12/16.

## 2013-04-20 NOTE — ED Notes (Signed)
Bed: WA01 Expected date:  Expected time:  Means of arrival:  Comments: Pancreatitis/abd pain

## 2013-04-20 NOTE — ED Provider Notes (Addendum)
CSN: [id number]     Arrival date & time 04/20/13  1116 History   First MD Initiated Contact with Patient 04/20/13 1120     Chief Complaint  Patient presents with  . Abdominal Pain   (Consider location/radiation/quality/duration/timing/severity/associated sxs/prior Treatment) Patient is a 42 y.o. female presenting with abdominal pain. The history is provided by the patient.  Abdominal Pain Pain location:  LUQ and RUQ Pain quality: sharp, shooting and stabbing   Pain radiates to:  Does not radiate Pain severity:  Severe Onset quality:  Sudden Duration:  6 hours Timing:  Constant Progression:  Worsening Chronicity:  Recurrent Context: not alcohol use   Context comment:  States felt fine when she went to bed last night and awaken this am with severe pain Relieved by:  Nothing Worsened by:  Eating Ineffective treatments:  Vomiting Associated symptoms: anorexia, nausea and vomiting   Associated symptoms: no chest pain, no constipation, no cough, no diarrhea, no fever and no shortness of breath   Risk factors comment:  Type 1 DM, DKA, recent cholecystectomy 10 days ago   Past Medical History  Diagnosis Date  . Diabetes mellitus     diagnosed in 1996-always been on insulin  . Kidney stone   . Seizure disorder     started with pregnancy of first son  . Gastritis   . Bipolar 2 disorder   . Post traumatic stress disorder     "flipping out" after people close to her died  . DKA (diabetic ketoacidoses)     Recurrent admissions for DKA, medication non-complaince, poor social situation  . Heart murmur   . Arthritis     r ankle-S/P surgery (2007)  . Diabetic neuropathy, type I diabetes mellitus     numbness bilaterally feet  . Anemia     childhood  . Depression     childhood  . Hepatitis C     biopsy in 2010-no rx-was supposed to see a hepatologist in [place].  With cirrhosis  . Neuropathy     in legs and feet and hands  . Cataracts, bilateral   . Scoliosis    Past  Surgical History  Procedure Laterality Date  . Ankle surgery  2007  . Cesarean section    . Endometrial biopsy  06/22/2012  . Cholecystectomy  04/07/2013  . Cholecystectomy N/A 04/07/2013    Procedure: LAPAROSCOPIC CHOLECYSTECTOMY WITH INTRAOPERATIVE CHOLANGIOGRAM;  Surgeon: Ernestene Mention, MD;  Location: [facility];  Service: General;  Laterality: N/A;   Family History  Problem Relation Age of Onset  . Diabetes Mother     currently 39  . Fibromyalgia Mother   . Cirrhosis Father     died in 39  . Diabetes Maternal Grandmother   . Diabetes Maternal Aunt    History  Substance Use Topics  . Smoking status: Current Every Day Smoker -- 0.10 packs/day for 25 years    Types: Cigarettes  . Smokeless tobacco: Never Used  . Alcohol Use: No     Comment: Endorses hasn't been drinking   OB History   Grav Para Term Preterm Abortions TAB SAB Ect Mult Living   4 3 3  0 1 0 1 0 1 2     Review of Systems  Constitutional: Negative for fever.  Respiratory: Negative for cough and shortness of breath.   Cardiovascular: Negative for chest pain.  Gastrointestinal: Positive for nausea, vomiting, abdominal pain and anorexia. Negative for diarrhea and constipation.  All other systems reviewed and are negative.  Allergies  Penicillins  Home Medications   Current Outpatient Rx  Name  Route  Sig  Dispense  Refill  . aspirin EC 81 MG tablet   Oral   Take 81 mg by mouth daily.          . calcium carbonate (TUMS - DOSED IN MG ELEMENTAL CALCIUM) 500 MG chewable tablet   Oral   Chew 2 tablets by mouth daily as needed. For heartburn         . gabapentin (NEURONTIN) 400 MG capsule   Oral   Take 400 mg by mouth 3 (three) times daily.         Marland Kitchen glipiZIDE (GLUCOTROL) 5 MG tablet   Oral   Take 5 mg by mouth 2 (two) times daily.         Marland Kitchen glucose blood test strip      Use as instructed   100 each   12   . hydrochlorothiazide (MICROZIDE) 12.5 MG capsule   Oral   Take 12.5 mg by  mouth daily.         Marland Kitchen HYDROcodone-acetaminophen (NORCO/VICODIN) 5-325 MG per tablet   Oral   Take 1 tablet by mouth every 4 (four) hours as needed for pain.   8 tablet   0   . HYDROcodone-acetaminophen (NORCO/VICODIN) 5-325 MG per tablet   Oral   Take 1-2 tablets by mouth every 4 (four) hours as needed.   40 tablet   0   . insulin glargine (LANTUS) 100 UNIT/ML injection   Subcutaneous   Inject 50 Units into the skin at bedtime.         . medroxyPROGESTERone (PROVERA) 10 MG tablet   Oral   Take 2 tablets (20 mg total) by mouth daily.   60 tablet   1   . meloxicam (MOBIC) 15 MG tablet   Oral   Take 15 mg by mouth daily.          . metFORMIN (GLUCOPHAGE) 1000 MG tablet   Oral   Take 1,000 mg by mouth 2 (two) times daily.         . metoCLOPramide (REGLAN) 10 MG tablet   Oral   Take 10 mg by mouth 3 (three) times daily before meals.         . Multiple Vitamin (MULTIVITAMIN WITH MINERALS) TABS tablet   Oral   Take 1 tablet by mouth daily.         . ondansetron (ZOFRAN ODT) 8 MG disintegrating tablet   Oral   Take 1 tablet (8 mg total) by mouth every 8 (eight) hours as needed for nausea.   10 tablet   0   . ranitidine (ZANTAC) 150 MG tablet   Oral   Take 150 mg by mouth 2 (two) times daily.         . risperidone (RISPERDAL) 4 MG tablet   Oral   Take 2 mg by mouth at bedtime.          . sitaGLIPtin (JANUVIA) 100 MG tablet   Oral   Take 100 mg by mouth daily.          LMP 02/14/2013 Physical Exam  Nursing note and vitals reviewed. Constitutional: She is oriented to person, place, and time. She appears well-developed and well-nourished. She appears distressed.  Wailing and rolling around in the bed, screaming that her abd hurt  HENT:  Head: Normocephalic and atraumatic.  Mouth/Throat: Oropharynx is clear and moist. Mucous membranes are dry.  Eyes: Conjunctivae and EOM are normal. Pupils are equal, round, and reactive to light.  Neck: Normal  range of motion. Neck supple.  Cardiovascular: Normal rate, regular rhythm and intact distal pulses.   No murmur heard. Pulmonary/Chest: Effort normal and breath sounds normal. No respiratory distress. She has no wheezes. She has no rales.  Abdominal: Soft. She exhibits no distension. There is tenderness in the right upper quadrant, epigastric area and left upper quadrant. There is no rebound and no guarding.    Abdomen is soft.  Surgical sites well appearing and healing well without erythema or drainage  Musculoskeletal: Normal range of motion. She exhibits no edema and no tenderness.  Neurological: She is alert and oriented to person, place, and time.  Skin: Skin is warm and dry. No rash noted. No erythema.  Psychiatric: She has a normal mood and affect. Her behavior is normal.    ED Course  Procedures (including critical care time) Labs Review Labs Reviewed  CBC WITH DIFFERENTIAL - Abnormal; Notable for the following:    WBC 11.0 (*)    RBC 5.78 (*)    MCV 71.1 (*)    MCH 22.7 (*)    Monocytes Relative 1 (*)    Neutro Abs 8.5 (*)    All other components within normal limits  COMPREHENSIVE METABOLIC PANEL - Abnormal; Notable for the following:    Glucose, Bld 446 (*)    Calcium 11.2 (*)    Total Protein 9.2 (*)    AST 42 (*)    ALT 38 (*)    All other components within normal limits  BLOOD GAS, VENOUS - Abnormal; Notable for the following:    pH, Ven 7.351 (*)    pCO2, Ven 44.9 (*)    pO2, Ven 00.0 (*)    Bicarbonate 24.2 (*)    All other components within normal limits  GLUCOSE, CAPILLARY - Abnormal; Notable for the following:    Glucose-Capillary 395 (*)    All other components within normal limits  URINALYSIS, ROUTINE W REFLEX MICROSCOPIC - Abnormal; Notable for the following:    Specific Gravity, Urine 1.038 (*)    Glucose, UA >1000 (*)    Ketones, ur 40 (*)    All other components within normal limits  URINE MICROSCOPIC-ADD ON - Abnormal; Notable for the  following:    Squamous Epithelial / LPF FEW (*)    All other components within normal limits  GLUCOSE, CAPILLARY - Abnormal; Notable for the following:    Glucose-Capillary 361 (*)    All other components within normal limits  LIPASE, BLOOD  POCT PREGNANCY, URINE   Imaging Review Ct Abdomen Pelvis W Contrast  04/20/2013   CLINICAL DATA:  Abdominal pain. History of laparoscopic cholecystectomy 04/06/2013.  EXAM: CT ABDOMEN AND PELVIS WITH CONTRAST  TECHNIQUE: Multidetector CT imaging of the abdomen and pelvis was performed using the standard protocol following bolus administration of intravenous contrast.  CONTRAST:  OMNIPAQUE IOHEXOL 300 MG/ML  SOLN  COMPARISON:  04/02/2013.  FINDINGS: The lung bases are clear. No pleural effusion.  The the liver is unremarkable. There is a a rim of low attenuation in the liver in the region of the prior cholecystectomy bed. This could be reactive edema, a small postoperative hematoma or surgical packing. No findings to suggest a bile leak. The common bile duct is normal in caliber. The pancreas is normal. The main pancreatic duct is stable. The spleen is normal. The adrenal glands and kidneys are normal.  The  stomach, duodenum, small bowel and colon are grossly normal without oral contrast. No inflammatory changes, mass lesions or obstructive findings. The appendix is normal. The uterus and ovaries are unremarkable. The bladder is normal. No pelvic mass, adenopathy or significant free pelvic fluid collection. There are small scattered mesenteric and retroperitoneal lymph nodes but no mass or adenopathy. The aorta is normal in caliber. Minimal distal atherosclerotic calcifications. The branch vessels are normal. The portal, hepatic, splenic and renal veins are patent.  The bony structures are unremarkable. There is a significant left convex thoracolumbar scoliosis fall.  IMPRESSION: Postoperative changes from a recent cholecystectomy with probable postoperative  changes in the cholecystectomy bed with either a small liquified hematoma, reactive edema in the liver or surgical packing. No findings to suggest a bile leak/biloma.   Electronically Signed   By: Loralie Champagne M.D.   On: 04/20/2013 14:50    EKG Interpretation   None       MDM  No diagnosis found.  Patient with a long-standing history of type 1 diabetes, DKA, hepatitis C and abdominal pain who is status post cholecystectomy 10 days ago who woke up this morning with severe 10 out of 10 abdominal pain and vomiting. Patient does not check CBG's regularly and today has not taken her insulin. Surgical sites well appearing abdomen is soft but tender in the upper quadrants.  This pain could be related to her chronic abdominal pain however given her recent surgery feel that a bile leak or abscess needs to be ruled out.  CBC, CMP, lipase, DPT, CT of the abdomen and pelvis with IV contrast pending. Patient given IV pain medication and nausea medication  1:39 PM Mild leukocytosis of 11,000 and normal hb.  No signs of DKA today and pt improved after 1 round of meds.  Pt given continued fluids and insulin for hyperglycemia.  CT pending to r/o above.  3:02 PM CT without acute surgical complications.  Pt does have a gap of 19 but now feeling better and will po challenge.  Pt given second dose of insulin.  Will recheck bs and ensure taking po's prior to d/c. Gwyneth Sprout, MD 04/20/13 1503  Gwyneth Sprout, MD 04/20/13 1511

## 2013-04-20 NOTE — ED Notes (Signed)
Pt c/o of abd pain 10/10 since 5am. Denies ETOH. Hx of same.

## 2013-04-20 NOTE — ED Notes (Signed)
Pt sleeping. Pt's family states she drank some Ginger Ale and has not vomited since. Dr. Jeraldine Loots aware.

## 2013-04-21 ENCOUNTER — Emergency Department (HOSPITAL_COMMUNITY)
Admission: EM | Admit: 2013-04-21 | Discharge: 2013-04-21 | Disposition: A | Discharge status: Home / Self Care | Payer: No Typology Code available for payment source | Attending: Emergency Medicine | Admitting: Emergency Medicine

## 2013-04-21 ENCOUNTER — Encounter (HOSPITAL_COMMUNITY): Payer: Self-pay | Admitting: Emergency Medicine

## 2013-04-21 DIAGNOSIS — Z9089 Acquired absence of other organs: Secondary | ICD-10-CM | POA: Insufficient documentation

## 2013-04-21 DIAGNOSIS — B1920 Unspecified viral hepatitis C without hepatic coma: Secondary | ICD-10-CM | POA: Insufficient documentation

## 2013-04-21 DIAGNOSIS — Z880 Allergy status to penicillin: Secondary | ICD-10-CM | POA: Insufficient documentation

## 2013-04-21 DIAGNOSIS — Z7982 Long term (current) use of aspirin: Secondary | ICD-10-CM | POA: Insufficient documentation

## 2013-04-21 DIAGNOSIS — M4120 Other idiopathic scoliosis, site unspecified: Secondary | ICD-10-CM | POA: Insufficient documentation

## 2013-04-21 DIAGNOSIS — Z794 Long term (current) use of insulin: Secondary | ICD-10-CM | POA: Insufficient documentation

## 2013-04-21 DIAGNOSIS — R109 Unspecified abdominal pain: Secondary | ICD-10-CM

## 2013-04-21 DIAGNOSIS — H269 Unspecified cataract: Secondary | ICD-10-CM | POA: Insufficient documentation

## 2013-04-21 DIAGNOSIS — M129 Arthropathy, unspecified: Secondary | ICD-10-CM | POA: Insufficient documentation

## 2013-04-21 DIAGNOSIS — Z79899 Other long term (current) drug therapy: Secondary | ICD-10-CM | POA: Insufficient documentation

## 2013-04-21 DIAGNOSIS — F3189 Other bipolar disorder: Secondary | ICD-10-CM | POA: Insufficient documentation

## 2013-04-21 DIAGNOSIS — R112 Nausea with vomiting, unspecified: Secondary | ICD-10-CM | POA: Insufficient documentation

## 2013-04-21 DIAGNOSIS — G40909 Epilepsy, unspecified, not intractable, without status epilepticus: Secondary | ICD-10-CM | POA: Insufficient documentation

## 2013-04-21 DIAGNOSIS — R1084 Generalized abdominal pain: Secondary | ICD-10-CM | POA: Insufficient documentation

## 2013-04-21 DIAGNOSIS — D72829 Elevated white blood cell count, unspecified: Secondary | ICD-10-CM | POA: Insufficient documentation

## 2013-04-21 DIAGNOSIS — F17200 Nicotine dependence, unspecified, uncomplicated: Secondary | ICD-10-CM | POA: Insufficient documentation

## 2013-04-21 DIAGNOSIS — E1142 Type 2 diabetes mellitus with diabetic polyneuropathy: Secondary | ICD-10-CM | POA: Insufficient documentation

## 2013-04-21 DIAGNOSIS — E1049 Type 1 diabetes mellitus with other diabetic neurological complication: Secondary | ICD-10-CM | POA: Insufficient documentation

## 2013-04-21 LAB — COMPREHENSIVE METABOLIC PANEL
ALT: 34 U/L (ref 0–35)
AST: 37 U/L (ref 0–37)
Albumin: 4.2 g/dL (ref 3.5–5.2)
Alkaline Phosphatase: 87 U/L (ref 39–117)
Calcium: 10.7 mg/dL — ABNORMAL HIGH (ref 8.4–10.5)
Chloride: 104 mEq/L (ref 96–112)
GFR calc Af Amer: >90 mL/min (ref >90)
Potassium: 3.9 mEq/L (ref 3.5–5.1)
Sodium: 142 mEq/L (ref 135–145)
Total Protein: 8.5 g/dL — ABNORMAL HIGH (ref 6.0–8.3)

## 2013-04-21 LAB — CBC WITH DIFFERENTIAL/PLATELET
Basophils Absolute: 0 10*3/uL (ref 0.0–0.1)
Eosinophils Absolute: 0 10*3/uL (ref 0.0–0.7)
HCT: 37.9 % (ref 36.0–46.0)
Lymphocytes Relative: 23 % (ref 12–46)
MCH: 22.6 pg — ABNORMAL LOW (ref 26.0–34.0)
MCHC: 31.9 g/dL (ref 30.0–36.0)
Monocytes Absolute: 0.6 10*3/uL (ref 0.1–1.0)
Neutrophils Relative %: 73 % (ref 43–77)
Platelets: 250 10*3/uL (ref 150–400)
RDW: 14.9 % (ref 11.5–15.5)

## 2013-04-21 LAB — LIPASE, BLOOD: Lipase: 16 U/L (ref 11–59)

## 2013-04-21 LAB — BLOOD GAS, ARTERIAL
Acid-base deficit: 0.5 mmol/L (ref 0.0–2.0)
Drawn by: 318141
O2 Saturation: 97.8 %
Patient temperature: 98.6
pCO2 arterial: 34.2 mmHg — ABNORMAL LOW (ref 35.0–45.0)
pO2, Arterial: 103 mmHg — ABNORMAL HIGH (ref 80.0–100.0)

## 2013-04-21 LAB — CG4 I-STAT (LACTIC ACID): Lactic Acid, Venous: 1.41 mmol/L (ref 0.5–2.2)

## 2013-04-21 MED ORDER — SODIUM CHLORIDE 0.9 % IV SOLN
1000.0000 mL | Freq: Once | INTRAVENOUS | Status: AC
  Administered 2013-04-21: 1000 mL via INTRAVENOUS

## 2013-04-21 MED ORDER — ONDANSETRON HCL 4 MG/2ML IJ SOLN
4.0000 mg | Freq: Once | INTRAMUSCULAR | Status: AC
  Administered 2013-04-21: 4 mg via INTRAVENOUS
  Filled 2013-04-21: qty 2

## 2013-04-21 MED ORDER — DICYCLOMINE HCL 10 MG/ML IM SOLN
20.0000 mg | Freq: Once | INTRAMUSCULAR | Status: AC
  Administered 2013-04-21: 20 mg via INTRAMUSCULAR
  Filled 2013-04-21: qty 2

## 2013-04-21 MED ORDER — SODIUM CHLORIDE 0.9 % IV SOLN
1000.0000 mL | INTRAVENOUS | Status: DC
  Administered 2013-04-21: 1000 mL via INTRAVENOUS

## 2013-04-21 MED ORDER — SODIUM CHLORIDE 0.9 % IV SOLN: 1000.0000 mL | Freq: Once | INTRAVENOUS | Status: DC

## 2013-04-21 MED ORDER — DICYCLOMINE HCL 20 MG PO TABS: 20.0000 mg | ORAL_TABLET | Freq: Four times a day (QID) | ORAL | Status: DC | PRN

## 2013-04-21 MED ORDER — DIPHENHYDRAMINE HCL 25 MG PO TABS: ORAL_TABLET | ORAL | Status: DC

## 2013-04-21 MED ORDER — METOCLOPRAMIDE HCL 5 MG/ML IJ SOLN
10.0000 mg | Freq: Once | INTRAMUSCULAR | Status: AC
  Administered 2013-04-21: 10 mg via INTRAVENOUS
  Filled 2013-04-21: qty 2

## 2013-04-21 MED ORDER — SODIUM CHLORIDE 0.9 % IV SOLN
Freq: Once | INTRAVENOUS | Status: AC
  Administered 2013-04-21: 02:00:00 via INTRAVENOUS

## 2013-04-21 MED ORDER — PROMETHAZINE HCL 25 MG RE SUPP: 25.0000 mg | Freq: Four times a day (QID) | RECTAL | Status: DC | PRN

## 2013-04-21 MED ORDER — DIPHENHYDRAMINE HCL 50 MG/ML IJ SOLN
25.0000 mg | Freq: Once | INTRAMUSCULAR | Status: AC
  Administered 2013-04-21: 25 mg via INTRAVENOUS
  Filled 2013-04-21: qty 1

## 2013-04-21 MED ORDER — METOCLOPRAMIDE HCL 10 MG PO TABS: 10.0000 mg | ORAL_TABLET | Freq: Three times a day (TID) | ORAL | Status: DC

## 2013-04-21 NOTE — ED Notes (Addendum)
Brought in by EMS with c/o persistent abdominal pain with nausea and vomiting.  Per EMS, pt was seen here yesterday for same--- was discharged home with pain medications and anti-emetics; pt reports that she can not keep anything down and pain remains "unbearable".

## 2013-04-21 NOTE — Discharge Instructions (Signed)
Abdominal Pain (Nonspecific) Your exam might not show the exact reason you have abdominal pain. Since there are many different causes of abdominal pain, another checkup and more tests may be needed. It is very important to follow up for lasting (persistent) or worsening symptoms. A possible cause of abdominal pain in any person who still has his or her appendix is acute appendicitis. Appendicitis is often hard to diagnose. Normal blood tests, urine tests, ultrasound, and CT scans do not completely rule out early appendicitis or other causes of abdominal pain. Sometimes, only the changes that happen over time will allow appendicitis and other causes of abdominal pain to be determined. Other potential problems that may require surgery may also take time to become more apparent. Because of this, it is important that you follow all of the instructions below. HOME CARE INSTRUCTIONS   Rest as much as possible.  Do not eat solid food until your pain is gone.  While adults or children have pain: A diet of water, weak decaffeinated tea, broth or bouillon, gelatin, oral rehydration solutions (ORS), frozen ice pops, or ice chips may be helpful.  When pain is gone in adults or children: Start a light diet (dry toast, crackers, applesauce, or white rice). Increase the diet slowly as long as it does not bother you. Eat no dairy products (including cheese and eggs) and no spicy, fatty, fried, or high-fiber foods.  Use no alcohol, caffeine, or cigarettes.  Take your regular medicines unless your caregiver told you not to.  Take any prescribed medicine as directed.  Only take over-the-counter or prescription medicines for pain, discomfort, or fever as directed by your caregiver. Do not give aspirin to children. If your caregiver has given you a follow-up appointment, it is very important to keep that appointment. Not keeping the appointment could result in a permanent injury and/or lasting (chronic) pain and/or  disability. If there is any problem keeping the appointment, you must call to reschedule.  SEEK IMMEDIATE MEDICAL CARE IF:   Your pain is not gone in 24 hours.  Your pain becomes worse, changes location, or feels different.  You or your child has an oral temperature above 102 F (38.9 C), not controlled by medicine.  Your baby is older than 3 months with a rectal temperature of 102 F (38.9 C) or higher.  Your baby is 86 months old or younger with a rectal temperature of 100.4 F (38 C) or higher.  You have shaking chills.  You keep throwing up (vomiting) or cannot drink liquids.  There is blood in your vomit or you see blood in your bowel movements.  Your bowel movements become dark or black.  You have frequent bowel movements.  Your bowel movements stop (become blocked) or you cannot pass gas.  You have bloody, frequent, or painful urination.  You have yellow discoloration in the skin or whites of the eyes.  Your stomach becomes bloated or bigger.  You have dizziness or fainting.  You have chest or back pain. MAKE SURE YOU:   Understand these instructions.  Will watch your condition.  Will get help right away if you are not doing well or get worse. Document Released: 06/03/2005 Document Revised: 08/26/2011 Document Reviewed: 05/01/2009 [facility] Patient Information 2014 [place], [place].  Nausea and Vomiting Nausea means you feel sick to your stomach. Throwing up (vomiting) is a reflex where stomach contents come out of your mouth. HOME CARE   Take medicine as told by your doctor.  Do not  force yourself to eat. However, you do need to drink fluids.  If you feel like eating, eat a normal diet as told by your doctor.  Eat rice, wheat, potatoes, bread, lean meats, yogurt, fruits, and vegetables.  Avoid high-fat foods.  Drink enough fluids to keep your pee (urine) clear or pale yellow.  Ask your doctor how to replace body fluid losses (rehydrate). Signs of  body fluid loss (dehydration) include:  Feeling very thirsty.  Dry lips and mouth.  Feeling dizzy.  Dark pee.  Peeing less than normal.  Feeling confused.  Fast breathing or heart rate. GET HELP RIGHT AWAY IF:   You have blood in your throw up.  You have black or bloody poop (stool).  You have a bad headache or stiff neck.  You feel confused.  You have bad belly (abdominal) pain.  You have chest pain or trouble breathing.  You do not pee at least once every 8 hours.  You have cold, clammy skin.  You keep throwing up after 24 to 48 hours.  You have a fever. MAKE SURE YOU:   Understand these instructions.  Will watch your condition.  Will get help right away if you are not doing well or get worse. Document Released: 11/20/2007 Document Revised: 08/26/2011 Document Reviewed: 11/02/2010 [facility] Patient Information 2014 [place], [place].

## 2013-04-21 NOTE — ED Provider Notes (Signed)
CSN: [id number]     Arrival date & time 04/21/13  0130 History   First MD Initiated Contact with Patient 04/21/13 [phone]     Chief Complaint  Patient presents with  . Abdominal Pain  . Emesis   (Consider location/radiation/quality/duration/timing/severity/associated sxs/prior Treatment) HPI 42 year old female presents to emergency department via EMS from home with complaint of diffuse severe abdominal pain.  Patient was seen earlier in the day for similar complaint.  She reports that she has been unable to keep medications down due to vomiting.  Pain is more left-sided.  She reports pelvic yesterday.  She denies any fever.  Patient is a very difficult historian as she is writhing on the bed and cannot answer questions well.  Per prior note, patient was worked up with a CT scan, and lab work, which showed a mildly elevated sugars, but no signs of biliary leak from her recent cholecystectomy. Past Medical History  Diagnosis Date  . Diabetes mellitus     diagnosed in 1996-always been on insulin  . Kidney stone   . Seizure disorder     started with pregnancy of first son  . Gastritis   . Bipolar 2 disorder   . Post traumatic stress disorder     "flipping out" after people close to her died  . DKA (diabetic ketoacidoses)     Recurrent admissions for DKA, medication non-complaince, poor social situation  . Heart murmur   . Arthritis     r ankle-S/P surgery (2007)  . Diabetic neuropathy, type I diabetes mellitus     numbness bilaterally feet  . Anemia     childhood  . Depression     childhood  . Hepatitis C     biopsy in 2010-no rx-was supposed to see a hepatologist in [place].  With cirrhosis  . Neuropathy     in legs and feet and hands  . Cataracts, bilateral   . Scoliosis    Past Surgical History  Procedure Laterality Date  . Ankle surgery  2007  . Cesarean section    . Endometrial biopsy  06/22/2012  . Cholecystectomy  04/07/2013  . Cholecystectomy N/A 04/07/2013     Procedure: LAPAROSCOPIC CHOLECYSTECTOMY WITH INTRAOPERATIVE CHOLANGIOGRAM;  Surgeon: Ernestene Mention, MD;  Location: [facility];  Service: General;  Laterality: N/A;   Family History  Problem Relation Age of Onset  . Diabetes Mother     currently 55  . Fibromyalgia Mother   . Cirrhosis Father     died in 7  . Diabetes Maternal Grandmother   . Diabetes Maternal Aunt    History  Substance Use Topics  . Smoking status: Current Every Day Smoker -- 0.10 packs/day for 25 years    Types: Cigarettes  . Smokeless tobacco: Never Used  . Alcohol Use: No     Comment: Endorses hasn't been drinking   OB History   Grav Para Term Preterm Abortions TAB SAB Ect Mult Living   4 3 3  0 1 0 1 0 1 2     Review of Systems  Unable to perform ROS: Other   patient refuses to answer review of system questions  Allergies  Penicillins  Home Medications   Current Outpatient Rx  Name  Route  Sig  Dispense  Refill  . aspirin EC 81 MG tablet   Oral   Take 81 mg by mouth daily.          . calcium carbonate (TUMS - DOSED IN MG ELEMENTAL CALCIUM) 500  MG chewable tablet   Oral   Chew 2 tablets by mouth daily as needed. For heartburn         . gabapentin (NEURONTIN) 400 MG capsule   Oral   Take 400 mg by mouth 3 (three) times daily.         Marland Kitchen glipiZIDE (GLUCOTROL) 5 MG tablet   Oral   Take 5 mg by mouth 2 (two) times daily.         . hydrochlorothiazide (MICROZIDE) 12.5 MG capsule   Oral   Take 12.5 mg by mouth daily.         Marland Kitchen HYDROcodone-acetaminophen (NORCO/VICODIN) 5-325 MG per tablet   Oral   Take 1-2 tablets by mouth every 4 (four) hours as needed.   40 tablet   0   . insulin glargine (LANTUS) 100 UNIT/ML injection   Subcutaneous   Inject 50 Units into the skin at bedtime.         . medroxyPROGESTERone (PROVERA) 10 MG tablet   Oral   Take 2 tablets (20 mg total) by mouth daily.   60 tablet   1   . meloxicam (MOBIC) 15 MG tablet   Oral   Take 15 mg by mouth daily.           . metFORMIN (GLUCOPHAGE) 1000 MG tablet   Oral   Take 1,000 mg by mouth 2 (two) times daily.         . metoCLOPramide (REGLAN) 10 MG tablet   Oral   Take 10 mg by mouth 3 (three) times daily before meals.         . Multiple Vitamin (MULTIVITAMIN WITH MINERALS) TABS tablet   Oral   Take 1 tablet by mouth daily.         . ranitidine (ZANTAC) 150 MG tablet   Oral   Take 150 mg by mouth 2 (two) times daily.         . risperidone (RISPERDAL) 4 MG tablet   Oral   Take 2 mg by mouth at bedtime.          . sitaGLIPtin (JANUVIA) 100 MG tablet   Oral   Take 100 mg by mouth daily.          BP 153/91  Pulse 91  Temp(Src) 98.4 F (36.9 C) (Oral)  Resp 20  SpO2 98%  LMP 02/14/2013 Physical Exam  Nursing note and vitals reviewed. Constitutional: She is oriented to person, place, and time. She appears well-developed and well-nourished. She appears distressed.  Agitated, thrashing in the bed, rolling on her stomach, and then back on her back, kicking her legs and flailing her arms  HENT:  Head: Normocephalic and atraumatic.  Nose: Nose normal.  Mouth/Throat: Oropharynx is clear and moist.  Eyes: Conjunctivae and EOM are normal. Pupils are equal, round, and reactive to light.  Neck: Normal range of motion. Neck supple. No JVD present. No tracheal deviation present. No thyromegaly present.  Cardiovascular: Normal rate, regular rhythm, normal heart sounds and intact distal pulses.  Exam reveals no gallop and no friction rub.   No murmur heard. Pulmonary/Chest: Effort normal and breath sounds normal. No stridor. No respiratory distress. She has no wheezes. She has no rales. She exhibits no tenderness.  Abdominal: Soft. Bowel sounds are normal. She exhibits no distension and no mass. There is tenderness (abdomen is soft, diffuse tenderness nonfocal, slightly more on the left side of the abdomen). There is no rebound and no guarding.  Musculoskeletal: Normal range of  motion. She exhibits no edema and no tenderness.  Lymphadenopathy:    She has no cervical adenopathy.  Neurological: She is alert and oriented to person, place, and time. She exhibits normal muscle tone. Coordination normal.  Skin: Skin is warm and dry. No rash noted. No erythema. No pallor.  Psychiatric: Her behavior is normal. Judgment and thought content normal.  Agitated inconsolable    ED Course  Procedures (including critical care time) Labs Review Labs Reviewed  CBC WITH DIFFERENTIAL - Abnormal; Notable for the following:    WBC 14.9 (*)    RBC 5.36 (*)    MCV 70.7 (*)    MCH 22.6 (*)    Neutro Abs 10.9 (*)    All other components within normal limits  COMPREHENSIVE METABOLIC PANEL - Abnormal; Notable for the following:    Glucose, Bld 252 (*)    Calcium 10.7 (*)    Total Protein 8.5 (*)    All other components within normal limits  BLOOD GAS, ARTERIAL - Abnormal; Notable for the following:    pCO2 arterial 34.2 (*)    pO2, Arterial 103.0 (*)    Allens test (pass/fail) POSITIVE (*)    All other components within normal limits  LIPASE, BLOOD  URINALYSIS, ROUTINE W REFLEX MICROSCOPIC  CG4 I-STAT (LACTIC ACID)   Imaging Review Ct Abdomen Pelvis W Contrast  04/20/2013   CLINICAL DATA:  Abdominal pain. History of laparoscopic cholecystectomy 04/06/2013.  EXAM: CT ABDOMEN AND PELVIS WITH CONTRAST  TECHNIQUE: Multidetector CT imaging of the abdomen and pelvis was performed using the standard protocol following bolus administration of intravenous contrast.  CONTRAST:  OMNIPAQUE IOHEXOL 300 MG/ML  SOLN  COMPARISON:  04/02/2013.  FINDINGS: The lung bases are clear. No pleural effusion.  The the liver is unremarkable. There is a a rim of low attenuation in the liver in the region of the prior cholecystectomy bed. This could be reactive edema, a small postoperative hematoma or surgical packing. No findings to suggest a bile leak. The common bile duct is normal in caliber. The  pancreas is normal. The main pancreatic duct is stable. The spleen is normal. The adrenal glands and kidneys are normal.  The stomach, duodenum, small bowel and colon are grossly normal without oral contrast. No inflammatory changes, mass lesions or obstructive findings. The appendix is normal. The uterus and ovaries are unremarkable. The bladder is normal. No pelvic mass, adenopathy or significant free pelvic fluid collection. There are small scattered mesenteric and retroperitoneal lymph nodes but no mass or adenopathy. The aorta is normal in caliber. Minimal distal atherosclerotic calcifications. The branch vessels are normal. The portal, hepatic, splenic and renal veins are patent.  The bony structures are unremarkable. There is a significant left convex thoracolumbar scoliosis fall.  IMPRESSION: Postoperative changes from a recent cholecystectomy with probable postoperative changes in the cholecystectomy bed with either a small liquified hematoma, reactive edema in the liver or surgical packing. No findings to suggest a bile leak/biloma.   Electronically Signed   By: Loralie Champagne M.D.   On: 04/20/2013 14:50      MDM   1. Abdominal pain   2. Nausea and vomiting   3. Leukocytosis, unspecified    42 year old female with abdominal pain.  Patient had recent cholecystectomy on October 22.  Per the CT scan from earlier today.  No acute findings.  Other than normal.  Post surgical changes.  She has slight elevation in her WBC count from  earlier today, which may be attributed to her vomiting.  She has had no vomiting here.  She is receiving IV fluids.  Pain controlled with Benadryl and Reglan.  We'll monitor for further vomiting  4:46 AM The patient has slept well after medications.  No signs of further vomiting.  Will discharge home with Reglan and Benadryl and Phenergan.  Olivia Mackie, MD 04/21/13 [phone]

## 2013-04-21 NOTE — ED Notes (Signed)
Bed: ZO10 Expected date:  Expected time:  Means of arrival:  Comments: EMS/gall stone related pain-vomiting her Norco

## 2013-04-29 ENCOUNTER — Ambulatory Visit (INDEPENDENT_AMBULATORY_CARE_PROVIDER_SITE_OTHER): Payer: Self-pay | Admitting: General Surgery

## 2013-04-29 ENCOUNTER — Encounter (INDEPENDENT_AMBULATORY_CARE_PROVIDER_SITE_OTHER): Payer: Self-pay | Admitting: General Surgery

## 2013-04-29 VITALS — BP 126/68 | HR 92 | Temp 98.4°F | Resp 15 | Ht 64.0 in | Wt 130.8 lb

## 2013-04-29 DIAGNOSIS — K8020 Calculus of gallbladder without cholecystitis without obstruction: Secondary | ICD-10-CM

## 2013-04-29 NOTE — Patient Instructions (Signed)
You have recovered from your gallbladder operation without any surgical complications.  Be sure to drink plenty of fluids and follow a low-fat, diabetic diet.  Return to see Dr. Derrell Lolling as needed.

## 2013-04-29 NOTE — Progress Notes (Signed)
Patient ID: Mary [name], female   DOB: [birth year], 42 y.o.   MRN: [id number] History: This patient presented to the emergency room with severe biliary pain for several days. On 04/07/2013 she underwent laparoscopic cholecystectomy with cholangiogram. The gallbladder was edematous but mostly chronically inflamed. The cholangiogram was normal. The final pathology report shows chronic cholecystitis with cholelithiasis. She is doing well. She says that she is eating  reasonably normally. She's a little constipated and having bowel movements. No nausea or vomiting. No fevers. No wound problems  Exam: Patient looks well. No distress. Abdomen soft and nontender. All trocar sites healing normally  Assessment: Chronic cholecystitis with cholelithiasis, uneventful recovery following laparoscopic cholecystectomy with cholangiogram Bipolar disorder Seizure disorder Insulin dependent diabetes mellitus History of DKA History of hepatitis C  Plan: Diet and activities discussed. Return as needed.    Angelia Mould. Derrell Lolling, M.D., [facility] General and Minimally invasive Surgery Breast and Colorectal Surgery Office:   [phone] Pager:   [phone]

## 2013-05-11 NOTE — Discharge Summary (Addendum)
Physician Discharge Summary  Mary [name]:[id number] DOB: 05/02/1971 DOA: 04/01/2013  PCP: Dartha Lodge, FNP  Admit date: 04/01/2013 Discharge date: 05/11/2013  Time spent: 25 minutes  Recommendations for Outpatient Follow-up:  1. Patient is to followup with her primary care physician. She is to also have a specific diabetic education plan given her uncontrolled diabetes mellitus 2. Patient may need further treatment for hepatitis C with Sofusbuvir, if she can be a compliant candidate, and should follow up with [facility] for discussion of this 3. Patient will need interval LFTs, and has been scheduled followup with general surgery   Discharge Diagnoses:  Principal Problem:   Abdominal pain Active Problems:   Hep C w/o coma, chronic   Seizure disorder   Non compliance w medication regimen   Tobacco abuse   Bipolar 1 disorder   DKA (diabetic ketoacidoses)   Nausea with vomiting   Discharge Condition: Good  Diet recommendation: Diabetic heart healthy  Filed Weights   04/02/13 0515  Weight: 56.564 kg (124 lb 11.2 oz)    History of present illness:  42 y.o. female with hx of intermittent abdominal pain, nausea and vomiting, hx of DM2, Bipolar disorder, recent DKA, chronic hepatitis, presents to the ER with abdominal pain for one day. She doesn't have any black or bloody stool. She has taken pain medication and constantly requested pain meds.  Evalaution in the ER included lipase of 30 and amylase of 200's. She has a WBC of 11K, Hb of 11 g/dL, and normal renal fx tests. Her BS was 211, with bicarb of 23. Patient was observed overnight and then became hungry and was able to tolerate a full diet prior to discharge She was encouraged strongly to followup with primary care physician for multiple medical changes as well as diabetic followup as I believe that she does have chronic pain and might have diabetic gastroparesis causing nausea and vomiting  Discharge Exam: Filed Vitals:    04/02/13 1412  BP: 104/66  Pulse: 71  Temp: 98.3 F (36.8 C)  Resp: 18    General: EOMI NCAT  Cardiovascular: S1-S2 no murmur rub or gallop Respiratory: Clinically clear  Discharge Instructions  Discharge Orders   Future Appointments Provider Department Dept Phone   05/27/2013 12:45 PM Adam Phenix, MD [facility] [phone]   Future Orders Complete By Expires   Diet - low sodium heart healthy  As directed    Increase activity slowly  As directed        Medication List    STOP taking these medications       diphenhydrAMINE 25 MG tablet  Commonly known as:  BENADRYL     HYDROcodone-acetaminophen 5-325 MG per tablet  Commonly known as:  NORCO/VICODIN     promethazine 25 MG tablet  Commonly known as:  PHENERGAN      TAKE these medications       aspirin EC 81 MG tablet  Take 81 mg by mouth daily.     calcium carbonate 500 MG chewable tablet  Commonly known as:  TUMS - dosed in mg elemental calcium  Chew 2 tablets by mouth daily as needed. For heartburn     gabapentin 400 MG capsule  Commonly known as:  NEURONTIN  Take 400 mg by mouth 3 (three) times daily.     glipiZIDE 5 MG tablet  Commonly known as:  GLUCOTROL  Take 5 mg by mouth 2 (two) times daily.     hydrochlorothiazide 12.5 MG capsule  Commonly  known as:  MICROZIDE  Take 12.5 mg by mouth daily.     insulin glargine 100 UNIT/ML injection  Commonly known as:  LANTUS  Inject 50 Units into the skin at bedtime.     meloxicam 15 MG tablet  Commonly known as:  MOBIC  Take 15 mg by mouth daily.     metFORMIN 1000 MG tablet  Commonly known as:  GLUCOPHAGE  Take 1,000 mg by mouth 2 (two) times daily.     multivitamin with minerals Tabs tablet  Take 1 tablet by mouth daily.     ranitidine 150 MG tablet  Commonly known as:  ZANTAC  Take 150 mg by mouth 2 (two) times daily.     risperidone 4 MG tablet  Commonly known as:  RISPERDAL  Take 2 mg by mouth at bedtime.     sitaGLIPtin  100 MG tablet  Commonly known as:  JANUVIA  Take 100 mg by mouth daily.       Allergies  Allergen Reactions  . Penicillins Rash       Follow-up Information   Schedule an appointment as soon as possible for a visit with STEELE,ANTHONY, FNP.   Specialty:  Nurse Practitioner      Follow up with Robyne Askew, MD On 04/07/2013. (Your appointment is at 2:30, be there 15 minutes early for check in.  This is to evaluate your gallbladder Ocie Doyne.)    Specialty:  General Surgery   Contact information:   [address] [place] [address] [phone]        The results of significant diagnostics from this hospitalization (including imaging, microbiology, ancillary and laboratory) are listed below for reference.    Significant Diagnostic Studies: Ct Abdomen Pelvis W Contrast  04/20/2013   CLINICAL DATA:  Abdominal pain. History of laparoscopic cholecystectomy 04/06/2013.  EXAM: CT ABDOMEN AND PELVIS WITH CONTRAST  TECHNIQUE: Multidetector CT imaging of the abdomen and pelvis was performed using the standard protocol following bolus administration of intravenous contrast.  CONTRAST:  OMNIPAQUE IOHEXOL 300 MG/ML  SOLN  COMPARISON:  04/02/2013.  FINDINGS: The lung bases are clear. No pleural effusion.  The the liver is unremarkable. There is a a rim of low attenuation in the liver in the region of the prior cholecystectomy bed. This could be reactive edema, a small postoperative hematoma or surgical packing. No findings to suggest a bile leak. The common bile duct is normal in caliber. The pancreas is normal. The main pancreatic duct is stable. The spleen is normal. The adrenal glands and kidneys are normal.  The stomach, duodenum, small bowel and colon are grossly normal without oral contrast. No inflammatory changes, mass lesions or obstructive findings. The appendix is normal. The uterus and ovaries are unremarkable. The bladder is normal. No pelvic mass, adenopathy or  significant free pelvic fluid collection. There are small scattered mesenteric and retroperitoneal lymph nodes but no mass or adenopathy. The aorta is normal in caliber. Minimal distal atherosclerotic calcifications. The branch vessels are normal. The portal, hepatic, splenic and renal veins are patent.  The bony structures are unremarkable. There is a significant left convex thoracolumbar scoliosis fall.  IMPRESSION: Postoperative changes from a recent cholecystectomy with probable postoperative changes in the cholecystectomy bed with either a small liquified hematoma, reactive edema in the liver or surgical packing. No findings to suggest a bile leak/biloma.   Electronically Signed   By: Loralie Champagne M.D.   On: 04/20/2013 14:50    Microbiology:  No results found for this or any previous visit (from the past 240 hour(s)).   Labs: Basic Metabolic Panel: No results found for this basename: NA, K, CL, CO2, GLUCOSE, BUN, CREATININE, CALCIUM, MG, PHOS,  in the last 168 hours Liver Function Tests: No results found for this basename: AST, ALT, ALKPHOS, BILITOT, PROT, ALBUMIN,  in the last 168 hours No results found for this basename: LIPASE, AMYLASE,  in the last 168 hours No results found for this basename: AMMONIA,  in the last 168 hours CBC: No results found for this basename: WBC, NEUTROABS, HGB, HCT, MCV, PLT,  in the last 168 hours Cardiac Enzymes: No results found for this basename: CKTOTAL, CKMB, CKMBINDEX, TROPONINI,  in the last 168 hours BNP: BNP (last 3 results) No results found for this basename: PROBNP,  in the last 8760 hours CBG: No results found for this basename: GLUCAP,  in the last 168 hours     Signed:  Rhetta Mura  [facility] 05/11/2013, 10:39 AM

## 2013-05-15 ENCOUNTER — Emergency Department (HOSPITAL_COMMUNITY)
Admission: EM | Admit: 2013-05-15 | Discharge: 2013-05-15 | Disposition: A | Discharge status: Home / Self Care | Payer: No Typology Code available for payment source | Attending: Emergency Medicine | Admitting: Emergency Medicine

## 2013-05-15 ENCOUNTER — Encounter (HOSPITAL_COMMUNITY): Payer: Self-pay | Admitting: Emergency Medicine

## 2013-05-15 DIAGNOSIS — E1142 Type 2 diabetes mellitus with diabetic polyneuropathy: Secondary | ICD-10-CM | POA: Insufficient documentation

## 2013-05-15 DIAGNOSIS — G8929 Other chronic pain: Secondary | ICD-10-CM | POA: Insufficient documentation

## 2013-05-15 DIAGNOSIS — Z7982 Long term (current) use of aspirin: Secondary | ICD-10-CM | POA: Insufficient documentation

## 2013-05-15 DIAGNOSIS — Z794 Long term (current) use of insulin: Secondary | ICD-10-CM | POA: Insufficient documentation

## 2013-05-15 DIAGNOSIS — R112 Nausea with vomiting, unspecified: Secondary | ICD-10-CM | POA: Insufficient documentation

## 2013-05-15 DIAGNOSIS — R197 Diarrhea, unspecified: Secondary | ICD-10-CM | POA: Insufficient documentation

## 2013-05-15 DIAGNOSIS — Z87442 Personal history of urinary calculi: Secondary | ICD-10-CM | POA: Insufficient documentation

## 2013-05-15 DIAGNOSIS — Z9089 Acquired absence of other organs: Secondary | ICD-10-CM | POA: Insufficient documentation

## 2013-05-15 DIAGNOSIS — F329 Major depressive disorder, single episode, unspecified: Secondary | ICD-10-CM | POA: Insufficient documentation

## 2013-05-15 DIAGNOSIS — R011 Cardiac murmur, unspecified: Secondary | ICD-10-CM | POA: Insufficient documentation

## 2013-05-15 DIAGNOSIS — Z862 Personal history of diseases of the blood and blood-forming organs and certain disorders involving the immune mechanism: Secondary | ICD-10-CM | POA: Insufficient documentation

## 2013-05-15 DIAGNOSIS — Z3202 Encounter for pregnancy test, result negative: Secondary | ICD-10-CM | POA: Insufficient documentation

## 2013-05-15 DIAGNOSIS — R1013 Epigastric pain: Secondary | ICD-10-CM | POA: Insufficient documentation

## 2013-05-15 DIAGNOSIS — M19079 Primary osteoarthritis, unspecified ankle and foot: Secondary | ICD-10-CM | POA: Insufficient documentation

## 2013-05-15 DIAGNOSIS — F319 Bipolar disorder, unspecified: Secondary | ICD-10-CM | POA: Insufficient documentation

## 2013-05-15 DIAGNOSIS — F4310 Post-traumatic stress disorder, unspecified: Secondary | ICD-10-CM | POA: Insufficient documentation

## 2013-05-15 DIAGNOSIS — R1012 Left upper quadrant pain: Secondary | ICD-10-CM | POA: Insufficient documentation

## 2013-05-15 DIAGNOSIS — E1049 Type 1 diabetes mellitus with other diabetic neurological complication: Secondary | ICD-10-CM | POA: Insufficient documentation

## 2013-05-15 DIAGNOSIS — Z79899 Other long term (current) drug therapy: Secondary | ICD-10-CM | POA: Insufficient documentation

## 2013-05-15 DIAGNOSIS — F3289 Other specified depressive episodes: Secondary | ICD-10-CM | POA: Insufficient documentation

## 2013-05-15 DIAGNOSIS — Z880 Allergy status to penicillin: Secondary | ICD-10-CM | POA: Insufficient documentation

## 2013-05-15 DIAGNOSIS — F17200 Nicotine dependence, unspecified, uncomplicated: Secondary | ICD-10-CM | POA: Insufficient documentation

## 2013-05-15 DIAGNOSIS — R000 Tachycardia, unspecified: Secondary | ICD-10-CM | POA: Insufficient documentation

## 2013-05-15 DIAGNOSIS — Z8619 Personal history of other infectious and parasitic diseases: Secondary | ICD-10-CM | POA: Insufficient documentation

## 2013-05-15 DIAGNOSIS — Z8719 Personal history of other diseases of the digestive system: Secondary | ICD-10-CM | POA: Insufficient documentation

## 2013-05-15 LAB — URINALYSIS, ROUTINE W REFLEX MICROSCOPIC
Bilirubin Urine: NEGATIVE
Glucose, UA: NEGATIVE mg/dL
Hgb urine dipstick: NEGATIVE
Ketones, ur: 15 mg/dL — AB
Protein, ur: NEGATIVE mg/dL
pH: 5.5 (ref 5.0–8.0)

## 2013-05-15 LAB — COMPREHENSIVE METABOLIC PANEL
ALT: 43 U/L — ABNORMAL HIGH (ref 0–35)
AST: 43 U/L — ABNORMAL HIGH (ref 0–37)
Albumin: 4.1 g/dL (ref 3.5–5.2)
Alkaline Phosphatase: 91 U/L (ref 39–117)
Chloride: 101 mEq/L (ref 96–112)
Potassium: 4.1 mEq/L (ref 3.5–5.1)
Sodium: 133 mEq/L — ABNORMAL LOW (ref 135–145)
Total Bilirubin: 0.3 mg/dL (ref 0.3–1.2)

## 2013-05-15 LAB — CBC WITH DIFFERENTIAL/PLATELET
Basophils Absolute: 0 10*3/uL (ref 0.0–0.1)
HCT: 40.9 % (ref 36.0–46.0)
Lymphocytes Relative: 25 % (ref 12–46)
MCH: 22.8 pg — ABNORMAL LOW (ref 26.0–34.0)
Monocytes Absolute: 0.5 10*3/uL (ref 0.1–1.0)
Monocytes Relative: 4 % (ref 3–12)
Neutro Abs: 7.9 10*3/uL — ABNORMAL HIGH (ref 1.7–7.7)
Platelets: 174 10*3/uL (ref 150–400)
RDW: 14.4 % (ref 11.5–15.5)
WBC: 11.4 10*3/uL — ABNORMAL HIGH (ref 4.0–10.5)

## 2013-05-15 MED ORDER — FENTANYL CITRATE 0.05 MG/ML IJ SOLN
50.0000 ug | Freq: Once | INTRAMUSCULAR | Status: AC
  Administered 2013-05-15: 50 ug via INTRAVENOUS
  Filled 2013-05-15: qty 2

## 2013-05-15 MED ORDER — SODIUM CHLORIDE 0.9 % IV SOLN: INTRAVENOUS | Status: DC

## 2013-05-15 MED ORDER — METOCLOPRAMIDE HCL 5 MG/ML IJ SOLN
10.0000 mg | Freq: Once | INTRAMUSCULAR | Status: AC
  Administered 2013-05-15: 10 mg via INTRAVENOUS
  Filled 2013-05-15: qty 2

## 2013-05-15 MED ORDER — LORAZEPAM 2 MG/ML IJ SOLN
1.0000 mg | Freq: Once | INTRAMUSCULAR | Status: AC
  Administered 2013-05-15: 1 mg via INTRAVENOUS
  Filled 2013-05-15: qty 1

## 2013-05-15 MED ORDER — DIPHENHYDRAMINE HCL 50 MG/ML IJ SOLN
25.0000 mg | Freq: Once | INTRAMUSCULAR | Status: AC
  Administered 2013-05-15: 25 mg via INTRAVENOUS
  Filled 2013-05-15: qty 1

## 2013-05-15 MED ORDER — SODIUM CHLORIDE 0.9 % IV BOLUS (SEPSIS)
1000.0000 mL | Freq: Once | INTRAVENOUS | Status: AC
  Administered 2013-05-15: 1000 mL via INTRAVENOUS

## 2013-05-15 MED ORDER — PROMETHAZINE HCL 25 MG RE SUPP: 25.0000 mg | Freq: Four times a day (QID) | RECTAL | Status: DC | PRN

## 2013-05-15 MED ORDER — DICYCLOMINE HCL 20 MG PO TABS: 20.0000 mg | ORAL_TABLET | Freq: Four times a day (QID) | ORAL | Status: DC | PRN

## 2013-05-15 NOTE — ED Provider Notes (Signed)
CSN: [id number]     Arrival date & time 05/15/13  1450 History   First MD Initiated Contact with Patient 05/15/13 1609     Chief Complaint  Patient presents with  . Pancreatitis  . Abdominal Pain  . Nausea  . Emesis   (Consider location/radiation/quality/duration/timing/severity/associated sxs/prior Treatment) HPI  42 year old female with history of chronic abdominal pain, type 1 diabetes, hepatitis C, and bipolar presents for abdominal pain.  Prior surgery including cholecystectomy. Patient states since after eating a big Thanksgiving dinner on Thursday (3 days ago)  she endorse nausea and vomits, has vomit several times for the past several days but vomitus has been increasing in frequency. She has 4-5 bouts of vomitus per day. Vomitus is nonbloody nonbilious. Also reports having several bouts of loose stools. She endorsed diffuse abdominal pain, described as a sharp and stabbing sensation, similar to the abdominal pain as she had in the past. She denies taking over-the-counter pain medication and antinausea medication without relief. Her pain has been persistent. Denies fever but endorsed chills. Denies chest pain, shortness of breath, cough, denies dysuria, hematuria, hematochezia or melena.  Does not drink alcohol. Does not check her blood glucose on regular basis but admits to taking her diabetic medication as prescribed.  Past Medical History  Diagnosis Date  . Diabetes mellitus     diagnosed in 1996-always been on insulin  . Kidney stone   . Seizure disorder     started with pregnancy of first son  . Gastritis   . Bipolar 2 disorder   . Post traumatic stress disorder     "flipping out" after people close to her died  . DKA (diabetic ketoacidoses)     Recurrent admissions for DKA, medication non-complaince, poor social situation  . Heart murmur   . Arthritis     r ankle-S/P surgery (2007)  . Diabetic neuropathy, type I diabetes mellitus     numbness bilaterally feet  . Anemia      childhood  . Depression     childhood  . Hepatitis C     biopsy in 2010-no rx-was supposed to see a hepatologist in [place].  With cirrhosis  . Neuropathy     in legs and feet and hands  . Cataracts, bilateral   . Scoliosis    Past Surgical History  Procedure Laterality Date  . Ankle surgery  2007  . Endometrial biopsy  06/22/2012  . Cholecystectomy  04/07/2013  . Cholecystectomy N/A 04/07/2013    Procedure: LAPAROSCOPIC CHOLECYSTECTOMY WITH INTRAOPERATIVE CHOLANGIOGRAM;  Surgeon: Ernestene Mention, MD;  Location: [facility];  Service: General;  Laterality: N/A;  . Cesarean section      3 times   Family History  Problem Relation Age of Onset  . Diabetes Mother     currently 63  . Fibromyalgia Mother   . Cirrhosis Father     died in 71  . Diabetes Maternal Grandmother   . Diabetes Maternal Aunt    History  Substance Use Topics  . Smoking status: Current Every Day Smoker -- 0.10 packs/day for 25 years    Types: Cigarettes  . Smokeless tobacco: Never Used  . Alcohol Use: No     Comment: Endorses hasn't been drinking   OB History   Grav Para Term Preterm Abortions TAB SAB Ect Mult Living   4 3 3  0 1 0 1 0 1 2     Review of Systems  All other systems reviewed and are negative.  Allergies  Penicillins  Home Medications   Current Outpatient Rx  Name  Route  Sig  Dispense  Refill  . aspirin EC 81 MG tablet   Oral   Take 81 mg by mouth daily.          . calcium carbonate (TUMS - DOSED IN MG ELEMENTAL CALCIUM) 500 MG chewable tablet   Oral   Chew 2 tablets by mouth daily as needed. For heartburn         . dicyclomine (BENTYL) 20 MG tablet   Oral   Take 1 tablet (20 mg total) by mouth every 6 (six) hours as needed for spasms (for abdominal cramping).   20 tablet   0   . diphenhydrAMINE (BENADRYL) 25 MG tablet      Take with reglan as needed   20 tablet   0   . gabapentin (NEURONTIN) 400 MG capsule   Oral   Take 600 mg by mouth 3 (three)  times daily.          . hydrochlorothiazide (MICROZIDE) 12.5 MG capsule   Oral   Take 12.5 mg by mouth daily.         Marland Kitchen HYDROcodone-acetaminophen (NORCO/VICODIN) 5-325 MG per tablet   Oral   Take 1-2 tablets by mouth every 4 (four) hours as needed.   40 tablet   0   . insulin glargine (LANTUS) 100 UNIT/ML injection   Subcutaneous   Inject 50 Units into the skin at bedtime.         . medroxyPROGESTERone (PROVERA) 10 MG tablet   Oral   Take 2 tablets (20 mg total) by mouth daily.   60 tablet   1   . meloxicam (MOBIC) 15 MG tablet   Oral   Take 15 mg by mouth daily.          . metFORMIN (GLUCOPHAGE) 1000 MG tablet   Oral   Take 1,000 mg by mouth 2 (two) times daily.         . metoCLOPramide (REGLAN) 10 MG tablet   Oral   Take 1 tablet (10 mg total) by mouth 4 (four) times daily -  before meals and at bedtime.   30 tablet   0   . Multiple Vitamin (MULTIVITAMIN WITH MINERALS) TABS tablet   Oral   Take 1 tablet by mouth daily.         . promethazine (PHENERGAN) 25 MG suppository   Rectal   Place 1 suppository (25 mg total) rectally every 6 (six) hours as needed for nausea or vomiting.   12 each   0   . ranitidine (ZANTAC) 150 MG tablet   Oral   Take 150 mg by mouth 2 (two) times daily.         . risperidone (RISPERDAL) 4 MG tablet   Oral   Take 2 mg by mouth at bedtime.          . sitaGLIPtin (JANUVIA) 100 MG tablet   Oral   Take 100 mg by mouth daily.         Marland Kitchen sulfamethoxazole-trimethoprim (BACTRIM DS) 800-160 MG per tablet   Oral   Take 1 tablet by mouth 2 (two) times daily.          BP 118/77  Pulse 118  Temp(Src) 97.9 F (36.6 C) (Oral)  Resp 16  SpO2 96%  LMP 02/14/2013 Physical Exam  Nursing note and vitals reviewed. Constitutional: She appears well-developed and well-nourished. No distress.  Awake,  alert, nontoxic appearance  HENT:  Head: Atraumatic.  Eyes: Conjunctivae are normal. Right eye exhibits no discharge. Left  eye exhibits no discharge.  Neck: Neck supple.  Cardiovascular: Regular rhythm.   Tachycardia without murmurs, rubs, or gallops noted.  Pulmonary/Chest: Effort normal. No respiratory distress. She exhibits no tenderness.  Abdominal: Soft. There is tenderness (Epigastric and left upper quadrant tenderness on palpation without guarding or rebound tenderness.). There is no rebound.  Musculoskeletal: She exhibits no tenderness.  ROM appears intact, no obvious focal weakness  Neurological:  Mental status and motor strength appears intact  Skin: No rash noted.  Psychiatric: She has a normal mood and affect.    ED Course  Procedures (including critical care time)  4:27 PM Patient with chronic upper abdominal pain with associate nausea vomiting diarrhea. Does have history of pancreatitis. Patient appears uncomfortable but nontoxic. Workup initiated to rule out DKA. I do not suspect biliary leak since patient has cholecystectomy performed one month ago  6:17 PM Patient currently resting comfortably, and felt better. Pregnancy tests is negative, UA shows no evidence of infection. Her labs are reassuring. No evidence to suggest DKA and no evidence to suggest acute infection. Patient now able to tolerates by mouth and is stable for discharge. Patient to followup with her PCP for further care. Return precaution discussed.  Labs Review Labs Reviewed  CBC WITH DIFFERENTIAL - Abnormal; Notable for the following:    WBC 11.4 (*)    RBC 5.83 (*)    MCV 70.2 (*)    MCH 22.8 (*)    Neutro Abs 7.9 (*)    All other components within normal limits  COMPREHENSIVE METABOLIC PANEL - Abnormal; Notable for the following:    Sodium 133 (*)    Glucose, Bld 121 (*)    AST 43 (*)    ALT 43 (*)    All other components within normal limits  URINALYSIS, ROUTINE W REFLEX MICROSCOPIC - Abnormal; Notable for the following:    APPearance CLOUDY (*)    Ketones, ur 15 (*)    All other components within normal limits   LIPASE, BLOOD  POCT PREGNANCY, URINE   Imaging Review No results found.  EKG Interpretation   None       MDM   1. Chronic abdominal pain   2. Nausea vomiting and diarrhea    BP 124/81  Pulse 93  Temp(Src) 98.4 F (36.9 C) (Oral)  Resp 13  SpO2 99%  LMP 02/14/2013  I have reviewed nursing notes and vital signs.  I reviewed available ER/hospitalization records thought the EMR     Fayrene Helper, [place] 05/15/13 1821

## 2013-05-15 NOTE — ED Provider Notes (Signed)
Medical screening examination/treatment/procedure(s) were performed by non-physician practitioner and as supervising physician I was immediately available for consultation/collaboration.  EKG Interpretation    Date/Time:  Saturday May 15 2013 16:31:23 EST Ventricular Rate:  125 PR Interval:  151 QRS Duration: 144 QT Interval:  378 QTC Calculation: 545 R Axis:   72 Text Interpretation:  Ectopic atrial tachycardia, unifocal Nonspecific intraventricular conduction delay Repol abnrm suggests ischemia, anterolateral Artifact in lead(s) V4 V5 and baseline wander in lead(s) II III aVF V1 V4 Confirmed by [name]  MD, [name] ([id number]) on 05/15/2013 4:34:37 PM             [name], MD 05/15/13 2316

## 2013-05-15 NOTE — ED Notes (Signed)
Pt here for chronic upper abd pain.  States it is worse since yesterday.  States she has been having NVD.

## 2013-05-18 ENCOUNTER — Encounter: Payer: Self-pay | Admitting: Obstetrics & Gynecology

## 2013-05-18 NOTE — Progress Notes (Signed)
Patient ID: Mary [name], female   DOB: 1970-12-05, 42 y.o.   MRN: [id number]  Chief Complaint  Patient presents with  . Follow-up  . Menorrhagia    seen in January by Dr Jolayne Panther     HPI Mary [name] is a 42 y.o. female.  [id number] Patient's last menstrual period was 02/14/2013. retruns for discussion of continued DUB. Had cholecystectomy recently.  HPI  Past Medical History  Diagnosis Date  . Diabetes mellitus     diagnosed in 1996-always been on insulin  . Kidney stone   . Seizure disorder     started with pregnancy of first son  . Gastritis   . Bipolar 2 disorder   . Post traumatic stress disorder     "flipping out" after people close to her died  . DKA (diabetic ketoacidoses)     Recurrent admissions for DKA, medication non-complaince, poor social situation  . Heart murmur   . Arthritis     r ankle-S/P surgery (2007)  . Diabetic neuropathy, type I diabetes mellitus     numbness bilaterally feet  . Anemia     childhood  . Depression     childhood  . Hepatitis C     biopsy in 2010-no rx-was supposed to see a hepatologist in [place].  With cirrhosis  . Neuropathy     in legs and feet and hands  . Cataracts, bilateral   . Scoliosis     Past Surgical History  Procedure Laterality Date  . Ankle surgery  2007  . Endometrial biopsy  06/22/2012  . Cholecystectomy  04/07/2013  . Cholecystectomy N/A 04/07/2013    Procedure: LAPAROSCOPIC CHOLECYSTECTOMY WITH INTRAOPERATIVE CHOLANGIOGRAM;  Surgeon: Ernestene Mention, MD;  Location: [facility];  Service: General;  Laterality: N/A;  . Cesarean section      3 times    Family History  Problem Relation Age of Onset  . Diabetes Mother     currently 63  . Fibromyalgia Mother   . Cirrhosis Father     died in 2  . Diabetes Maternal Grandmother   . Diabetes Maternal Aunt     Social History History  Substance Use Topics  . Smoking status: Current Every Day Smoker -- 0.10 packs/day for 25 years    Types:  Cigarettes  . Smokeless tobacco: Never Used  . Alcohol Use: No     Comment: Endorses hasn't been drinking    Allergies  Allergen Reactions  . Penicillins Rash    Current Outpatient Prescriptions  Medication Sig Dispense Refill  . aspirin EC 81 MG tablet Take 81 mg by mouth daily.       . calcium carbonate (TUMS - DOSED IN MG ELEMENTAL CALCIUM) 500 MG chewable tablet Chew 2 tablets by mouth daily as needed. For heartburn      . gabapentin (NEURONTIN) 400 MG capsule Take 600 mg by mouth 3 (three) times daily.       . hydrochlorothiazide (MICROZIDE) 12.5 MG capsule Take 12.5 mg by mouth daily.      Marland Kitchen HYDROcodone-acetaminophen (NORCO/VICODIN) 5-325 MG per tablet Take 1-2 tablets by mouth every 4 (four) hours as needed.  40 tablet  0  . insulin glargine (LANTUS) 100 UNIT/ML injection Inject 50 Units into the skin at bedtime.      . medroxyPROGESTERone (PROVERA) 10 MG tablet Take 2 tablets (20 mg total) by mouth daily.  60 tablet  1  . meloxicam (MOBIC) 15 MG tablet Take 15 mg by mouth daily.       Marland Kitchen  metFORMIN (GLUCOPHAGE) 1000 MG tablet Take 1,000 mg by mouth 2 (two) times daily.      . Multiple Vitamin (MULTIVITAMIN WITH MINERALS) TABS tablet Take 1 tablet by mouth daily.      . ranitidine (ZANTAC) 150 MG tablet Take 150 mg by mouth 2 (two) times daily.      . risperidone (RISPERDAL) 4 MG tablet Take 2 mg by mouth at bedtime.       . sitaGLIPtin (JANUVIA) 100 MG tablet Take 100 mg by mouth daily.      Marland Kitchen dicyclomine (BENTYL) 20 MG tablet Take 1 tablet (20 mg total) by mouth every 6 (six) hours as needed for spasms (for abdominal cramping).  20 tablet  0  . diphenhydrAMINE (BENADRYL) 25 MG tablet Take with reglan as needed  20 tablet  0  . metoCLOPramide (REGLAN) 10 MG tablet Take 1 tablet (10 mg total) by mouth 4 (four) times daily -  before meals and at bedtime.  30 tablet  0  . promethazine (PHENERGAN) 25 MG suppository Place 1 suppository (25 mg total) rectally every 6 (six) hours as  needed for nausea or vomiting.  12 each  0  . sulfamethoxazole-trimethoprim (BACTRIM DS) 800-160 MG per tablet Take 1 tablet by mouth 2 (two) times daily.       No current facility-administered medications for this visit.    Review of Systems Review of Systems  Constitutional: Negative for fever.  Genitourinary: Positive for menstrual problem. Negative for dysuria and vaginal discharge.    Blood pressure 109/76, pulse 106, temperature 98.1 F (36.7 C), temperature source Oral, height 5\' 4"  (1.626 m), weight 126 lb (57.153 kg), last menstrual period 02/14/2013.  Physical Exam Physical Exam  Data Reviewed  *RADIOLOGY REPORT*  Clinical Data: Pelvic pain and vaginal bleeding.  TRANSABDOMINAL AND TRANSVAGINAL ULTRASOUND OF PELVIS  Technique: Both transabdominal and transvaginal ultrasound  examinations of the pelvis were performed. Transabdominal technique  was performed for global imaging of the pelvis including uterus,  ovaries, adnexal regions, and pelvic cul-de-sac.  It was necessary to proceed with endovaginal exam following the  transabdominal exam to visualize the uterus and ovaries in greater  detail.  Comparison: Pelvic ultrasound performed 07/06/2009, and CT of the  abdomen and pelvis performed 09/12/2011  Findings:  Uterus: Normal in size; small 0.9 cm fibroid incidentally noted  along the posterior aspect of the fundus. Measures 7.9 x 3.9 x 4.2  cm.  Endometrium: Normal in thickness and appearance; measures 0.4 cm in  thickness.  Right ovary: Measures 3.2 x 2.2 x 1.9 cm. A complex 1.9 cm cystic  focus at the right ovary is nonspecific but could reflect a small  hemorrhagic cyst.  Left ovary: Normal appearance/no adnexal mass; measures 2.6 x 1.8 x  1.4 cm.  Other findings: No free fluid seen within the pelvic cul-de-sac.  IMPRESSION:  1. Small complex 1.9 cm cystic focus of the right ovary is  nonspecific but could reflect a small hemorrhagic cyst.  2. Small  myometrial fibroid noted at the posterior fundus.  Original Report Authenticated By: Tonia Ghent, M.D.    Assessment    DUB perimenopause     Plan    Provera 20 mg daily. RTC 6 weeks        Malone Admire

## 2013-05-27 ENCOUNTER — Ambulatory Visit: Payer: No Typology Code available for payment source | Admitting: Obstetrics & Gynecology

## 2013-09-30 ENCOUNTER — Emergency Department (HOSPITAL_COMMUNITY)
Admission: EM | Admit: 2013-09-30 | Discharge: 2013-10-01 | Disposition: A | Discharge status: Home / Self Care | Payer: Self-pay | Attending: Emergency Medicine | Admitting: Emergency Medicine

## 2013-09-30 ENCOUNTER — Encounter (HOSPITAL_COMMUNITY): Payer: Self-pay | Admitting: Emergency Medicine

## 2013-09-30 DIAGNOSIS — R112 Nausea with vomiting, unspecified: Secondary | ICD-10-CM | POA: Insufficient documentation

## 2013-09-30 DIAGNOSIS — G8929 Other chronic pain: Secondary | ICD-10-CM | POA: Insufficient documentation

## 2013-09-30 DIAGNOSIS — Z791 Long term (current) use of non-steroidal anti-inflammatories (NSAID): Secondary | ICD-10-CM | POA: Insufficient documentation

## 2013-09-30 DIAGNOSIS — F3189 Other bipolar disorder: Secondary | ICD-10-CM | POA: Insufficient documentation

## 2013-09-30 DIAGNOSIS — IMO0001 Reserved for inherently not codable concepts without codable children: Secondary | ICD-10-CM

## 2013-09-30 DIAGNOSIS — G40909 Epilepsy, unspecified, not intractable, without status epilepticus: Secondary | ICD-10-CM | POA: Insufficient documentation

## 2013-09-30 DIAGNOSIS — M19079 Primary osteoarthritis, unspecified ankle and foot: Secondary | ICD-10-CM | POA: Insufficient documentation

## 2013-09-30 DIAGNOSIS — R52 Pain, unspecified: Secondary | ICD-10-CM | POA: Insufficient documentation

## 2013-09-30 DIAGNOSIS — R1084 Generalized abdominal pain: Secondary | ICD-10-CM | POA: Insufficient documentation

## 2013-09-30 DIAGNOSIS — Z9089 Acquired absence of other organs: Secondary | ICD-10-CM | POA: Insufficient documentation

## 2013-09-30 DIAGNOSIS — G589 Mononeuropathy, unspecified: Secondary | ICD-10-CM | POA: Insufficient documentation

## 2013-09-30 DIAGNOSIS — Z79899 Other long term (current) drug therapy: Secondary | ICD-10-CM | POA: Insufficient documentation

## 2013-09-30 DIAGNOSIS — Z7982 Long term (current) use of aspirin: Secondary | ICD-10-CM | POA: Insufficient documentation

## 2013-09-30 DIAGNOSIS — E1142 Type 2 diabetes mellitus with diabetic polyneuropathy: Secondary | ICD-10-CM | POA: Insufficient documentation

## 2013-09-30 DIAGNOSIS — Z794 Long term (current) use of insulin: Secondary | ICD-10-CM | POA: Insufficient documentation

## 2013-09-30 DIAGNOSIS — Z3202 Encounter for pregnancy test, result negative: Secondary | ICD-10-CM | POA: Insufficient documentation

## 2013-09-30 DIAGNOSIS — Z792 Long term (current) use of antibiotics: Secondary | ICD-10-CM | POA: Insufficient documentation

## 2013-09-30 DIAGNOSIS — F4310 Post-traumatic stress disorder, unspecified: Secondary | ICD-10-CM | POA: Insufficient documentation

## 2013-09-30 DIAGNOSIS — Z8619 Personal history of other infectious and parasitic diseases: Secondary | ICD-10-CM | POA: Insufficient documentation

## 2013-09-30 DIAGNOSIS — E119 Type 2 diabetes mellitus without complications: Secondary | ICD-10-CM

## 2013-09-30 DIAGNOSIS — Z87442 Personal history of urinary calculi: Secondary | ICD-10-CM | POA: Insufficient documentation

## 2013-09-30 DIAGNOSIS — R109 Unspecified abdominal pain: Secondary | ICD-10-CM

## 2013-09-30 DIAGNOSIS — Z880 Allergy status to penicillin: Secondary | ICD-10-CM | POA: Insufficient documentation

## 2013-09-30 DIAGNOSIS — R011 Cardiac murmur, unspecified: Secondary | ICD-10-CM | POA: Insufficient documentation

## 2013-09-30 DIAGNOSIS — F17200 Nicotine dependence, unspecified, uncomplicated: Secondary | ICD-10-CM | POA: Insufficient documentation

## 2013-09-30 DIAGNOSIS — E1049 Type 1 diabetes mellitus with other diabetic neurological complication: Secondary | ICD-10-CM | POA: Insufficient documentation

## 2013-09-30 DIAGNOSIS — Z862 Personal history of diseases of the blood and blood-forming organs and certain disorders involving the immune mechanism: Secondary | ICD-10-CM | POA: Insufficient documentation

## 2013-09-30 DIAGNOSIS — Z8719 Personal history of other diseases of the digestive system: Secondary | ICD-10-CM | POA: Insufficient documentation

## 2013-09-30 LAB — CBC WITH DIFFERENTIAL/PLATELET
BASOS PCT: 0 % (ref 0–1)
Basophils Absolute: 0 10*3/uL (ref 0.0–0.1)
EOS PCT: 2 % (ref 0–5)
Eosinophils Absolute: 0.3 10*3/uL (ref 0.0–0.7)
HEMATOCRIT: 44.7 % (ref 36.0–46.0)
Hemoglobin: 14.1 g/dL (ref 12.0–15.0)
LYMPHS ABS: 4.5 10*3/uL — AB (ref 0.7–4.0)
Lymphocytes Relative: 32 % (ref 12–46)
MCH: 22.8 pg — AB (ref 26.0–34.0)
MCHC: 31.5 g/dL (ref 30.0–36.0)
MCV: 72.2 fL — ABNORMAL LOW (ref 78.0–100.0)
MONO ABS: 0.8 10*3/uL (ref 0.1–1.0)
MONOS PCT: 6 % (ref 3–12)
NEUTROS ABS: 8.4 10*3/uL — AB (ref 1.7–7.7)
Neutrophils Relative %: 60 % (ref 43–77)
Platelets: 178 10*3/uL (ref 150–400)
RBC: 6.19 MIL/uL — AB (ref 3.87–5.11)
RDW: 14.7 % (ref 11.5–15.5)
WBC: 14 10*3/uL — AB (ref 4.0–10.5)

## 2013-09-30 LAB — BLOOD GAS, VENOUS
ACID-BASE DEFICIT: 1.4 mmol/L (ref 0.0–2.0)
Bicarbonate: 22 mEq/L (ref 20.0–24.0)
FIO2: 0.21 %
O2 Saturation: 99 %
PATIENT TEMPERATURE: 98.6
PO2 VEN: 152 mmHg — AB (ref 30.0–45.0)
TCO2: 19.3 mmol/L (ref 0–100)
pCO2, Ven: 34.7 mmHg — ABNORMAL LOW (ref 45.0–50.0)
pH, Ven: 7.419 — ABNORMAL HIGH (ref 7.250–7.300)

## 2013-09-30 LAB — COMPREHENSIVE METABOLIC PANEL
ALT: 71 U/L — ABNORMAL HIGH (ref 0–35)
AST: 83 U/L — AB (ref 0–37)
Albumin: 4 g/dL (ref 3.5–5.2)
Alkaline Phosphatase: 115 U/L (ref 39–117)
BUN: 21 mg/dL (ref 6–23)
CALCIUM: 10.7 mg/dL — AB (ref 8.4–10.5)
CHLORIDE: 98 meq/L (ref 96–112)
CO2: 19 mEq/L (ref 19–32)
CREATININE: 0.63 mg/dL (ref 0.50–1.10)
GFR calc Af Amer: >90 mL/min (ref >90)
GLUCOSE: 184 mg/dL — AB (ref 70–99)
Potassium: 4.2 mEq/L (ref 3.7–5.3)
Sodium: 137 mEq/L (ref 137–147)
Total Bilirubin: 0.4 mg/dL (ref 0.3–1.2)
Total Protein: 8.7 g/dL — ABNORMAL HIGH (ref 6.0–8.3)

## 2013-09-30 LAB — CBG MONITORING, ED: Glucose-Capillary: 189 mg/dL — ABNORMAL HIGH (ref 70–99)

## 2013-09-30 LAB — LIPASE, BLOOD: LIPASE: 19 U/L (ref 11–59)

## 2013-09-30 MED ORDER — SODIUM CHLORIDE 0.9 % IV BOLUS (SEPSIS)
1000.0000 mL | Freq: Once | INTRAVENOUS | Status: AC
  Administered 2013-09-30: 1000 mL via INTRAVENOUS

## 2013-09-30 MED ORDER — FENTANYL CITRATE 0.05 MG/ML IJ SOLN
50.0000 ug | Freq: Once | INTRAMUSCULAR | Status: AC
  Administered 2013-09-30: 50 ug via INTRAVENOUS
  Filled 2013-09-30: qty 2

## 2013-09-30 MED ORDER — PROMETHAZINE HCL 25 MG/ML IJ SOLN
25.0000 mg | Freq: Once | INTRAMUSCULAR | Status: AC
  Administered 2013-09-30: 25 mg via INTRAVENOUS
  Filled 2013-09-30: qty 1

## 2013-09-30 MED ORDER — ONDANSETRON HCL 4 MG/2ML IJ SOLN
4.0000 mg | Freq: Once | INTRAMUSCULAR | Status: AC
  Administered 2013-09-30: 4 mg via INTRAVENOUS
  Filled 2013-09-30: qty 2

## 2013-09-30 MED ORDER — HYDROMORPHONE HCL PF 1 MG/ML IJ SOLN
1.0000 mg | Freq: Once | INTRAMUSCULAR | Status: AC
  Administered 2013-09-30: 1 mg via INTRAVENOUS
  Filled 2013-09-30: qty 1

## 2013-09-30 NOTE — ED Notes (Signed)
Bed: WTR5 Expected date:  Expected time:  Means of arrival:  Comments: EMS 

## 2013-09-30 NOTE — ED Notes (Signed)
Unable to complete screening question d/t pt screaming and moaning.

## 2013-09-30 NOTE — ED Provider Notes (Signed)
CSN: [id number]     Arrival date & time 09/30/13  2130 History   First MD Initiated Contact with Patient 09/30/13 2249     Chief Complaint  Patient presents with  . Abdominal Pain     (Consider location/radiation/quality/duration/timing/severity/associated sxs/prior Treatment) The history is provided by the patient and medical records. No language interpreter was used.    KATHEE TUMLIN is a 43 y.o. female  with a hx of IDDM, chronic abd pain, Hep C and bipolar disorder presents to the Emergency Department complaining of gradual, persistent, progressively worsening abd pain onset this morning. Associated symptoms include nausea and vomiting.  She reports 6-8 bouts of emesis and has yellow emesis in hand today.   She reports abd pain is diffuse, described as a sharp and stabbing, rated at a 10/10 and similar to the abdominal pain as she had in the past.  Emesis is NBNB.  She denies diarrhea.  Pt ir rolling in bed and screaming, therefore it is difficult to illicit a Hx as she does not want to answer my questions.  She does report taking her insulin as directed.  She reports being out of her narcotic pain medications and has not attempted any OTC meds.  NO aggravating or alleviating factors  Pt denies fever, chills, headache, neck pain, CP, SOB, weakness, dizziness, syncope or dysuria.     Past Medical History  Diagnosis Date  . Diabetes mellitus     diagnosed in 1996-always been on insulin  . Kidney stone   . Seizure disorder     started with pregnancy of first son  . Gastritis   . Bipolar 2 disorder   . Post traumatic stress disorder     "flipping out" after people close to her died  . DKA (diabetic ketoacidoses)     Recurrent admissions for DKA, medication non-complaince, poor social situation  . Heart murmur   . Arthritis     r ankle-S/P surgery (2007)  . Diabetic neuropathy, type I diabetes mellitus     numbness bilaterally feet  . Anemia     childhood  . Depression      childhood  . Hepatitis C     biopsy in 2010-no rx-was supposed to see a hepatologist in [place].  With cirrhosis  . Neuropathy     in legs and feet and hands  . Cataracts, bilateral   . Scoliosis    Past Surgical History  Procedure Laterality Date  . Ankle surgery  2007  . Endometrial biopsy  06/22/2012  . Cholecystectomy  04/07/2013  . Cholecystectomy N/A 04/07/2013    Procedure: LAPAROSCOPIC CHOLECYSTECTOMY WITH INTRAOPERATIVE CHOLANGIOGRAM;  Surgeon: Adin Hector, MD;  Location: [facility];  Service: General;  Laterality: N/A;  . Cesarean section      3 times   Family History  Problem Relation Age of Onset  . Diabetes Mother     currently 65  . Fibromyalgia Mother   . Cirrhosis Father     died in 54  . Diabetes Maternal Grandmother   . Diabetes Maternal Aunt    History  Substance Use Topics  . Smoking status: Current Every Day Smoker -- 0.10 packs/day for 25 years    Types: Cigarettes  . Smokeless tobacco: Never Used  . Alcohol Use: No     Comment: Endorses hasn't been drinking   OB History   Grav Para Term Preterm Abortions TAB SAB Ect Mult Living   4 3 3  0 1 0 1 0  1 2     Review of Systems  Constitutional: Negative for fever, diaphoresis, appetite change, fatigue and unexpected weight change.  HENT: Negative for mouth sores and trouble swallowing.   Respiratory: Negative for cough, chest tightness, shortness of breath, wheezing and stridor.   Cardiovascular: Negative for chest pain and palpitations.  Gastrointestinal: Positive for nausea, vomiting and abdominal pain. Negative for diarrhea, constipation, blood in stool, abdominal distention and rectal pain.  Genitourinary: Negative for dysuria, urgency, frequency, hematuria, flank pain and difficulty urinating.  Musculoskeletal: Negative for back pain, neck pain and neck stiffness.  Skin: Negative for rash.  Neurological: Negative for weakness.  Hematological: Negative for adenopathy.   Psychiatric/Behavioral: Negative for confusion.  All other systems reviewed and are negative.     Allergies  Penicillins  Home Medications   Prior to Admission medications   Medication Sig Start Date End Date Taking? Authorizing Provider  aspirin EC 81 MG tablet Take 81 mg by mouth daily.     Historical Provider, MD  calcium carbonate (TUMS - DOSED IN MG ELEMENTAL CALCIUM) 500 MG chewable tablet Chew 2 tablets by mouth daily as needed. For heartburn    Historical Provider, MD  dicyclomine (BENTYL) 20 MG tablet Take 1 tablet (20 mg total) by mouth every 6 (six) hours as needed for spasms (for abdominal cramping). 05/15/13   Domenic Moras, PA-C  diphenhydrAMINE (BENADRYL) 25 MG tablet Take with reglan as needed 04/21/13   Kalman Drape, MD  gabapentin (NEURONTIN) 400 MG capsule Take 600 mg by mouth 3 (three) times daily.     Historical Provider, MD  hydrochlorothiazide (MICROZIDE) 12.5 MG capsule Take 12.5 mg by mouth daily.    Historical Provider, MD  HYDROcodone-acetaminophen (NORCO/VICODIN) 5-325 MG per tablet Take 1-2 tablets by mouth every 4 (four) hours as needed. 04/08/13   Earnstine Regal, PA-C  insulin glargine (LANTUS) 100 UNIT/ML injection Inject 50 Units into the skin at bedtime.    Historical Provider, MD  medroxyPROGESTERone (PROVERA) 10 MG tablet Take 2 tablets (20 mg total) by mouth daily. 04/15/13   Woodroe Mode, MD  meloxicam (MOBIC) 15 MG tablet Take 15 mg by mouth daily.     Historical Provider, MD  metFORMIN (GLUCOPHAGE) 1000 MG tablet Take 1,000 mg by mouth 2 (two) times daily.    Historical Provider, MD  metoCLOPramide (REGLAN) 10 MG tablet Take 1 tablet (10 mg total) by mouth 4 (four) times daily -  before meals and at bedtime. 04/21/13   Kalman Drape, MD  Multiple Vitamin (MULTIVITAMIN WITH MINERALS) TABS tablet Take 1 tablet by mouth daily.    Historical Provider, MD  promethazine (PHENERGAN) 25 MG suppository Place 1 suppository (25 mg total) rectally every 6 (six)  hours as needed for nausea or vomiting. 05/15/13   Domenic Moras, PA-C  ranitidine (ZANTAC) 150 MG tablet Take 150 mg by mouth 2 (two) times daily.    Historical Provider, MD  risperidone (RISPERDAL) 4 MG tablet Take 2 mg by mouth at bedtime.     Historical Provider, MD  sitaGLIPtin (JANUVIA) 100 MG tablet Take 100 mg by mouth daily.    Historical Provider, MD  sulfamethoxazole-trimethoprim (BACTRIM DS) 800-160 MG per tablet Take 1 tablet by mouth 2 (two) times daily. 05/10/13   Historical Provider, MD   BP 127/66  Pulse 79  Temp(Src) 98.5 F (36.9 C) (Oral)  Resp 16  SpO2 99% Physical Exam  Nursing note and vitals reviewed. Constitutional: She is oriented to person, place, and  time. She appears well-developed and well-nourished.  HENT:  Head: Normocephalic and atraumatic.  Mouth/Throat: Oropharynx is clear and moist.  Eyes: Conjunctivae are normal. No scleral icterus.  Cardiovascular: Normal rate, regular rhythm, normal heart sounds and intact distal pulses.   No murmur heard. Pulmonary/Chest: Effort normal and breath sounds normal. No respiratory distress. She has no wheezes. She has no rales.  Clear and equal breath sounds  Abdominal: Soft. Bowel sounds are normal. She exhibits no distension and no mass. There is generalized tenderness. There is no rebound, no guarding and no CVA tenderness.  Generalized tenderness without guarding, rigidity or peritoneal signs; abdomen is soft on palpation No CVA tenderness  Neurological: She is alert and oriented to person, place, and time. She exhibits normal muscle tone. Coordination normal.  Skin: Skin is warm and dry. No erythema.  Psychiatric: She has a normal mood and affect.    ED Course  Procedures (including critical care time) Labs Review Labs Reviewed  CBC WITH DIFFERENTIAL - Abnormal; Notable for the following:    WBC 14.0 (*)    RBC 6.19 (*)    MCV 72.2 (*)    MCH 22.8 (*)    Neutro Abs 8.4 (*)    Lymphs Abs 4.5 (*)    All  other components within normal limits  COMPREHENSIVE METABOLIC PANEL - Abnormal; Notable for the following:    Glucose, Bld 184 (*)    Calcium 10.7 (*)    Total Protein 8.7 (*)    AST 83 (*)    ALT 71 (*)    All other components within normal limits  BLOOD GAS, VENOUS - Abnormal; Notable for the following:    pH, Ven 7.419 (*)    pCO2, Ven 34.7 (*)    pO2, Ven 152.0 (*)    All other components within normal limits  URINALYSIS, ROUTINE W REFLEX MICROSCOPIC - Abnormal; Notable for the following:    Color, Urine AMBER (*)    APPearance CLOUDY (*)    Glucose, UA 250 (*)    Bilirubin Urine SMALL (*)    Ketones, ur 15 (*)    Protein, ur 30 (*)    All other components within normal limits  URINE MICROSCOPIC-ADD ON - Abnormal; Notable for the following:    Squamous Epithelial / LPF FEW (*)    Bacteria, UA FEW (*)    Casts HYALINE CASTS (*)    All other components within normal limits  CBG MONITORING, ED - Abnormal; Notable for the following:    Glucose-Capillary 189 (*)    All other components within normal limits  LIPASE, BLOOD  BASIC METABOLIC PANEL  POC URINE PREG, ED    Imaging Review No results found.   EKG Interpretation None      MDM   Final diagnoses:  Abdominal pain  Nausea and vomiting  IDDM (insulin dependent diabetes mellitus)   Hervey Ard Shands presents for an acute exacerbation of her chronic abdominal pain.  Patient is tachycardic but afebrile. She has a leukocytosis which is not uncommon for her.  Patient also with mild hypercalcemia at 10.7, but baseline for her.  ALT and AST also elevated, but baseline.    12:14 AM Patient with elevated anion gap 18 however no evidence of DKA as she is not acidotic.  1:07 AM Patient was significantly improved abdominal pain, will redose pain medication. Second liter of fluid but gone. We'll check BMP to ensure anion gap is closing.  Patient abdomen soft and nontender at  this time. Tachycardia resolving. Patient's blood  pressure has remained stable.   1:59 AM Pt with PO trial in place and BMP pending.  Discussed with Junius Creamer, NP who will follow and dispo accordingly if her AG has closed and she is not vomiting.    Jarrett Soho Joceline Hinchcliff, PA-C 10/01/13 0159

## 2013-09-30 NOTE — ED Notes (Signed)
Pt brought in by EMS with c/o of ab pain that is centralized to umbilical area with n/v. Pt has a hx of hernias and tender to palpate at area. EMS states positive orthostatic changes. IV place to L hand. Pt reports inconsistent changes with pain in abdomen. Pt sitting in triage room.

## 2013-09-30 NOTE — ED Provider Notes (Signed)
MSE was initiated and I personally evaluated the patient and placed orders (if any) at  10:00 PM on September 30, 2013.  The patient appears stable so that the remainder of the MSE may be completed by another provider. She this morning with abdominal pain.  She states she took her insulin, but no medication the pain has been progressively getting worse through the day.  She states she had nausea with several episodes of vomiting.  No diarrhea.  She has not called her primary care physician.  She called EMS to transport her to the emergency department due to the pain. CBC CMet, lipase, have been ordered.  Patient has an IV in place, and to pick him 50 micrograms of fentanyl and Zofran for her discomfort.  While she is waiting placement in the main emergency department for further treatment.  Garald Balding, NP 09/30/13 2202

## 2013-09-30 NOTE — ED Notes (Signed)
Pt unable to void at this time. 

## 2013-10-01 ENCOUNTER — Encounter (HOSPITAL_COMMUNITY): Payer: Self-pay | Admitting: Emergency Medicine

## 2013-10-01 ENCOUNTER — Emergency Department (HOSPITAL_COMMUNITY)
Admission: EM | Admit: 2013-10-01 | Discharge: 2013-10-01 | Disposition: A | Discharge status: Home / Self Care | Payer: Medicaid Other | Attending: Emergency Medicine | Admitting: Emergency Medicine

## 2013-10-01 ENCOUNTER — Emergency Department (HOSPITAL_COMMUNITY): Payer: Self-pay

## 2013-10-01 DIAGNOSIS — M549 Dorsalgia, unspecified: Secondary | ICD-10-CM | POA: Insufficient documentation

## 2013-10-01 DIAGNOSIS — Z791 Long term (current) use of non-steroidal anti-inflammatories (NSAID): Secondary | ICD-10-CM | POA: Insufficient documentation

## 2013-10-01 DIAGNOSIS — Z91199 Patient's noncompliance with other medical treatment and regimen due to unspecified reason: Secondary | ICD-10-CM | POA: Insufficient documentation

## 2013-10-01 DIAGNOSIS — Z79899 Other long term (current) drug therapy: Secondary | ICD-10-CM | POA: Insufficient documentation

## 2013-10-01 DIAGNOSIS — Z87442 Personal history of urinary calculi: Secondary | ICD-10-CM | POA: Insufficient documentation

## 2013-10-01 DIAGNOSIS — F4310 Post-traumatic stress disorder, unspecified: Secondary | ICD-10-CM | POA: Insufficient documentation

## 2013-10-01 DIAGNOSIS — E1010 Type 1 diabetes mellitus with ketoacidosis without coma: Secondary | ICD-10-CM | POA: Insufficient documentation

## 2013-10-01 DIAGNOSIS — R109 Unspecified abdominal pain: Secondary | ICD-10-CM

## 2013-10-01 DIAGNOSIS — R7309 Other abnormal glucose: Secondary | ICD-10-CM | POA: Diagnosis not present

## 2013-10-01 DIAGNOSIS — R1084 Generalized abdominal pain: Secondary | ICD-10-CM | POA: Insufficient documentation

## 2013-10-01 DIAGNOSIS — Z794 Long term (current) use of insulin: Secondary | ICD-10-CM | POA: Insufficient documentation

## 2013-10-01 DIAGNOSIS — R739 Hyperglycemia, unspecified: Secondary | ICD-10-CM

## 2013-10-01 DIAGNOSIS — Z9119 Patient's noncompliance with other medical treatment and regimen: Secondary | ICD-10-CM | POA: Insufficient documentation

## 2013-10-01 DIAGNOSIS — E1049 Type 1 diabetes mellitus with other diabetic neurological complication: Secondary | ICD-10-CM | POA: Insufficient documentation

## 2013-10-01 DIAGNOSIS — Z8619 Personal history of other infectious and parasitic diseases: Secondary | ICD-10-CM | POA: Insufficient documentation

## 2013-10-01 DIAGNOSIS — G40909 Epilepsy, unspecified, not intractable, without status epilepticus: Secondary | ICD-10-CM | POA: Insufficient documentation

## 2013-10-01 DIAGNOSIS — R011 Cardiac murmur, unspecified: Secondary | ICD-10-CM | POA: Insufficient documentation

## 2013-10-01 DIAGNOSIS — E1142 Type 2 diabetes mellitus with diabetic polyneuropathy: Secondary | ICD-10-CM | POA: Insufficient documentation

## 2013-10-01 DIAGNOSIS — Z8739 Personal history of other diseases of the musculoskeletal system and connective tissue: Secondary | ICD-10-CM | POA: Insufficient documentation

## 2013-10-01 DIAGNOSIS — Z862 Personal history of diseases of the blood and blood-forming organs and certain disorders involving the immune mechanism: Secondary | ICD-10-CM | POA: Insufficient documentation

## 2013-10-01 DIAGNOSIS — Z9089 Acquired absence of other organs: Secondary | ICD-10-CM | POA: Insufficient documentation

## 2013-10-01 DIAGNOSIS — Z880 Allergy status to penicillin: Secondary | ICD-10-CM | POA: Insufficient documentation

## 2013-10-01 DIAGNOSIS — Z8719 Personal history of other diseases of the digestive system: Secondary | ICD-10-CM | POA: Insufficient documentation

## 2013-10-01 DIAGNOSIS — Z7982 Long term (current) use of aspirin: Secondary | ICD-10-CM | POA: Insufficient documentation

## 2013-10-01 DIAGNOSIS — F17200 Nicotine dependence, unspecified, uncomplicated: Secondary | ICD-10-CM | POA: Insufficient documentation

## 2013-10-01 DIAGNOSIS — F319 Bipolar disorder, unspecified: Secondary | ICD-10-CM | POA: Insufficient documentation

## 2013-10-01 DIAGNOSIS — M19079 Primary osteoarthritis, unspecified ankle and foot: Secondary | ICD-10-CM | POA: Insufficient documentation

## 2013-10-01 LAB — BASIC METABOLIC PANEL
BUN: 22 mg/dL (ref 6–23)
BUN: 20 mg/dL (ref 6–23)
CALCIUM: 10.1 mg/dL (ref 8.4–10.5)
CO2: 15 meq/L — AB (ref 19–32)
CO2: 21 mEq/L (ref 19–32)
Calcium: 9.4 mg/dL (ref 8.4–10.5)
Chloride: 103 mEq/L (ref 96–112)
Chloride: 98 mEq/L (ref 96–112)
Creatinine, Ser: 0.51 mg/dL (ref 0.50–1.10)
Creatinine, Ser: 0.59 mg/dL (ref 0.50–1.10)
GFR calc Af Amer: >90 mL/min (ref >90)
GFR calc Af Amer: >90 mL/min (ref >90)
GFR calc non Af Amer: >90 mL/min (ref >90)
GLUCOSE: 202 mg/dL — AB (ref 70–99)
Glucose, Bld: 272 mg/dL — ABNORMAL HIGH (ref 70–99)
POTASSIUM: 4.5 meq/L (ref 3.7–5.3)
Potassium: 4.4 mEq/L (ref 3.7–5.3)
SODIUM: 137 meq/L (ref 137–147)
Sodium: 138 mEq/L (ref 137–147)

## 2013-10-01 LAB — I-STAT CHEM 8, ED
BUN: 20 mg/dL (ref 6–23)
CHLORIDE: 108 meq/L (ref 96–112)
Calcium, Ion: 1.18 mmol/L (ref 1.12–1.23)
Creatinine, Ser: 0.5 mg/dL (ref 0.50–1.10)
Glucose, Bld: 237 mg/dL — ABNORMAL HIGH (ref 70–99)
HEMATOCRIT: 39 % (ref 36.0–46.0)
Hemoglobin: 13.3 g/dL (ref 12.0–15.0)
POTASSIUM: 4 meq/L (ref 3.7–5.3)
Sodium: 143 mEq/L (ref 137–147)
TCO2: 20 mmol/L (ref 0–100)

## 2013-10-01 LAB — URINALYSIS, ROUTINE W REFLEX MICROSCOPIC
BILIRUBIN URINE: NEGATIVE
Glucose, UA: 250 mg/dL — AB
Glucose, UA: >1000 mg/dL — AB
HGB URINE DIPSTICK: NEGATIVE
Hgb urine dipstick: NEGATIVE
KETONES UR: 15 mg/dL — AB
Leukocytes, UA: NEGATIVE
Leukocytes, UA: NEGATIVE
NITRITE: NEGATIVE
Nitrite: NEGATIVE
Protein, ur: 30 mg/dL — AB
Protein, ur: NEGATIVE mg/dL
SPECIFIC GRAVITY, URINE: 1.027 (ref 1.005–1.030)
Specific Gravity, Urine: 1.037 — ABNORMAL HIGH (ref 1.005–1.030)
UROBILINOGEN UA: 0.2 mg/dL (ref 0.0–1.0)
Urobilinogen, UA: 0.2 mg/dL (ref 0.0–1.0)
pH: 5 (ref 5.0–8.0)
pH: 5.5 (ref 5.0–8.0)

## 2013-10-01 LAB — BLOOD GAS, VENOUS
ACID-BASE DEFICIT: 2.6 mmol/L — AB (ref 0.0–2.0)
Bicarbonate: 23 mEq/L (ref 20.0–24.0)
O2 Saturation: 43.4 %
PATIENT TEMPERATURE: 98.6
TCO2: 21.1 mmol/L (ref 0–100)
pCO2, Ven: 44.9 mmHg — ABNORMAL LOW (ref 45.0–50.0)
pH, Ven: 7.329 — ABNORMAL HIGH (ref 7.250–7.300)

## 2013-10-01 LAB — CBC
HCT: 41.5 % (ref 36.0–46.0)
HEMOGLOBIN: 13.4 g/dL (ref 12.0–15.0)
MCH: 23.2 pg — AB (ref 26.0–34.0)
MCHC: 32.3 g/dL (ref 30.0–36.0)
MCV: 71.8 fL — ABNORMAL LOW (ref 78.0–100.0)
Platelets: ADEQUATE 10*3/uL (ref 150–400)
RBC: 5.78 MIL/uL — ABNORMAL HIGH (ref 3.87–5.11)
RDW: 14.7 % (ref 11.5–15.5)
WBC: 12.8 10*3/uL — ABNORMAL HIGH (ref 4.0–10.5)

## 2013-10-01 LAB — URINE MICROSCOPIC-ADD ON

## 2013-10-01 LAB — POC URINE PREG, ED: Preg Test, Ur: NEGATIVE

## 2013-10-01 LAB — CBG MONITORING, ED
Glucose-Capillary: 231 mg/dL — ABNORMAL HIGH (ref 70–99)
Glucose-Capillary: 244 mg/dL — ABNORMAL HIGH (ref 70–99)
Glucose-Capillary: 224 mg/dL — ABNORMAL HIGH (ref 70–99)
Glucose-Capillary: 192 mg/dL — ABNORMAL HIGH (ref 70–99)

## 2013-10-01 LAB — TROPONIN I: Troponin I: <0.3 ng/mL (ref <0.30)

## 2013-10-01 MED ORDER — INSULIN NPH ISOPHANE & REGULAR (70-30) 100 UNIT/ML ~~LOC~~ SUSP: 35.0000 [IU] | Freq: Every day | SUBCUTANEOUS | Status: DC

## 2013-10-01 MED ORDER — SODIUM CHLORIDE 0.9 % IV BOLUS (SEPSIS)
500.0000 mL | Freq: Once | INTRAVENOUS | Status: AC
  Administered 2013-10-01: 500 mL via INTRAVENOUS

## 2013-10-01 MED ORDER — HYDROMORPHONE HCL PF 1 MG/ML IJ SOLN
1.0000 mg | Freq: Once | INTRAMUSCULAR | Status: AC
  Administered 2013-10-01: 1 mg via INTRAVENOUS
  Filled 2013-10-01: qty 1

## 2013-10-01 MED ORDER — ONDANSETRON 4 MG PO TBDP: 4.0000 mg | ORAL_TABLET | Freq: Once | ORAL | Status: DC

## 2013-10-01 MED ORDER — FENTANYL CITRATE 0.05 MG/ML IJ SOLN
12.5000 ug | Freq: Once | INTRAMUSCULAR | Status: AC
  Administered 2013-10-01: 12.5 ug via INTRAVENOUS
  Filled 2013-10-01: qty 2

## 2013-10-01 MED ORDER — ONDANSETRON HCL 4 MG/2ML IJ SOLN
4.0000 mg | Freq: Once | INTRAMUSCULAR | Status: AC
  Administered 2013-10-01: 4 mg via INTRAVENOUS
  Filled 2013-10-01: qty 2

## 2013-10-01 MED ORDER — METOCLOPRAMIDE HCL 5 MG/ML IJ SOLN
10.0000 mg | Freq: Once | INTRAMUSCULAR | Status: AC
  Administered 2013-10-01: 10 mg via INTRAMUSCULAR
  Filled 2013-10-01: qty 2

## 2013-10-01 MED ORDER — METOCLOPRAMIDE HCL 10 MG PO TABS: 10.0000 mg | ORAL_TABLET | Freq: Four times a day (QID) | ORAL | Status: DC

## 2013-10-01 MED ORDER — KETOROLAC TROMETHAMINE 30 MG/ML IJ SOLN
30.0000 mg | Freq: Once | INTRAMUSCULAR | Status: AC
  Administered 2013-10-01: 30 mg via INTRAVENOUS
  Filled 2013-10-01: qty 1

## 2013-10-01 MED ORDER — SODIUM CHLORIDE 0.9 % IV BOLUS (SEPSIS)
1000.0000 mL | Freq: Once | INTRAVENOUS | Status: AC
  Administered 2013-10-01: 1000 mL via INTRAVENOUS

## 2013-10-01 MED ORDER — ONDANSETRON HCL 4 MG PO TABS: 4.0000 mg | ORAL_TABLET | Freq: Four times a day (QID) | ORAL | Status: DC

## 2013-10-01 NOTE — Consult Note (Signed)
[facility] Medical Consultation  Mary [name] EVO:[id number] DOB: [birth year] DOA: 10/01/2013 PCP: Elbert Ewings, FNP   Requesting physician: Dr. Reather Converse Date of consultation: 10/01/13 Reason for consultation: abdominal discomfort  Impression/Recommendations Active Problems:   1. Abdominal discomfort - No fevers, diarrhea, BRBPR - imaging studies pending: Should they show any abnormalities please call us for admission evaluation. - Patient has history of drug seeking behavior.  Suspect secondary gain.  Discussion with PA indicates that patient consistently keeps referring to dilaudid while in the ED. - Last CT scan reports postoperative changed from recent cholecystectomy. - In 2014 she has had 4 CT scans of her abdomen. - Review of vital signs WNL as well as BMP.  - Recommend discharging with continue supportive therapy while at home.  Consider discharging with anti emetics either with zofran ODT or phenergan to be administered PR. - Would encourage patient to advance her own diet slowly and start with clear liquids - Pt can continue routine cbg monitoring qac and qhs - Also would set up follow up appointment with her pcp prior to dicharging from ED.  2. DM - Would recommend holding metformin on discharge given abdominal discomfort (as this may cause abdominal discomfort) - While patient's po intake is poor would recommend decreasing current insulin regimen by half. So Novolin 70/30 35 units daily. - Also would discharge on reglan as diabetic gastroparesis has been associated with abdominal discomfort and nausea.  Would avoid opiods as they can make diabetic gastroparesis symptoms worse.  3. Leukocytosis - No fevers, and trending down on last check off of antibiotics.   Chief Complaint: abdominal discomfort.  HPI:  43 y/o with h/o chronic abdominal discomfort. Presenting to the ED complaining of abdominal pain. Patient has drug seeking behavior per my discussion with PA  and she always refers to pain medication in the context of comparison to dilaudid.  She states that the abdominal discomfort started yesterday but is unwilling to give me more information. The patient is tearful. Since 2014 she has had 4 CT scans of her abdomen.  We were consulted for further evaluation and recommendations.  Review of Systems:  Unable to accurately assess due to unwillingness of patient to provide further history.  Past Medical History  Diagnosis Date  . Kidney stone   . Seizure disorder     started with pregnancy of first son  . Gastritis   . Bipolar 2 disorder   . Post traumatic stress disorder     "flipping out" after people close to her died  . Heart murmur   . Arthritis     r ankle-S/P surgery (2007)  . Anemia     childhood  . Depression     childhood  . Hepatitis C     biopsy in 2010-no rx-was supposed to see a hepatologist in [place].  With cirrhosis  . Neuropathy     in legs and feet and hands  . Cataracts, bilateral   . Scoliosis   . Diabetes mellitus     diagnosed in 1996-always been on insulin  . DKA (diabetic ketoacidoses)     Recurrent admissions for DKA, medication non-complaince, poor social situation  . Diabetic neuropathy, type I diabetes mellitus     numbness bilaterally feet   Past Surgical History  Procedure Laterality Date  . Ankle surgery  2007  . Endometrial biopsy  06/22/2012  . Cholecystectomy  04/07/2013  . Cholecystectomy N/A 04/07/2013    Procedure: LAPAROSCOPIC CHOLECYSTECTOMY WITH INTRAOPERATIVE CHOLANGIOGRAM;  Surgeon: Renelda Loma  Alyssa Grove, MD;  Location: [facility];  Service: General;  Laterality: N/A;  . Cesarean section      3 times   Social History:  reports that she has been smoking Cigarettes.  She has a 2.5 pack-year smoking history. She has never used smokeless tobacco. She reports that she uses illicit drugs (Marijuana) about once per week. She reports that she does not drink alcohol.  Allergies  Allergen Reactions   . Penicillins Rash   Family History  Problem Relation Age of Onset  . Diabetes Mother     currently 45  . Fibromyalgia Mother   . Cirrhosis Father     died in 4  . Diabetes Maternal Grandmother   . Diabetes Maternal Aunt     Prior to Admission medications   Medication Sig Start Date End Date Taking? Authorizing Provider  dicyclomine (BENTYL) 20 MG tablet Take 1 tablet (20 mg total) by mouth every 6 (six) hours as needed for spasms (for abdominal cramping). 05/15/13  Yes Domenic Moras, PA-C  gabapentin (NEURONTIN) 400 MG capsule Take 600 mg by mouth 3 (three) times daily.    Yes Historical Provider, MD  hydrochlorothiazide (MICROZIDE) 12.5 MG capsule Take 12.5 mg by mouth daily.   Yes Historical Provider, MD  insulin NPH-regular Human (NOVOLIN 70/30) (70-30) 100 UNIT/ML injection Inject 35 Units into the skin 2 (two) times daily with a meal.   Yes Historical Provider, MD  medroxyPROGESTERone (PROVERA) 10 MG tablet Take 2 tablets (20 mg total) by mouth daily. 04/15/13  Yes Woodroe Mode, MD  metFORMIN (GLUCOPHAGE) 1000 MG tablet Take 1,000 mg by mouth 2 (two) times daily.   Yes Historical Provider, MD  metoCLOPramide (REGLAN) 10 MG tablet Take 1 tablet (10 mg total) by mouth 4 (four) times daily -  before meals and at bedtime. 04/21/13  Yes Kalman Drape, MD  risperidone (RISPERDAL) 4 MG tablet Take 2 mg by mouth at bedtime.    Yes Historical Provider, MD  sitaGLIPtin (JANUVIA) 100 MG tablet Take 100 mg by mouth daily.   Yes Historical Provider, MD  aspirin EC 81 MG tablet Take 81 mg by mouth daily.     Historical Provider, MD  calcium carbonate (TUMS - DOSED IN MG ELEMENTAL CALCIUM) 500 MG chewable tablet Chew 2 tablets by mouth daily as needed. For heartburn    Historical Provider, MD  diphenhydrAMINE (BENADRYL) 25 MG tablet Take with reglan as needed 04/21/13   Kalman Drape, MD  insulin glargine (LANTUS) 100 UNIT/ML injection Inject 50 Units into the skin at bedtime.    Historical  Provider, MD  meloxicam (MOBIC) 15 MG tablet Take 15 mg by mouth daily.     Historical Provider, MD  Multiple Vitamin (MULTIVITAMIN WITH MINERALS) TABS tablet Take 1 tablet by mouth daily.    Historical Provider, MD  ranitidine (ZANTAC) 150 MG tablet Take 150 mg by mouth 2 (two) times daily.    Historical Provider, MD  sulfamethoxazole-trimethoprim (BACTRIM DS) 800-160 MG per tablet Take 1 tablet by mouth 2 (two) times daily. 05/10/13   Historical Provider, MD   Physical Exam: Blood pressure 146/80, pulse 65, temperature 98.4 F (36.9 C), temperature source Oral, resp. rate 17, SpO2 100.00%. Filed Vitals:   10/01/13 0900  BP: 146/80  Pulse: 65  Temp:   Resp: 17     General:  Pt in NAD, alert and awake. Overly emotional  Eyes: EOMI, non icteric  ENT: normal exterior appearance  Neck: supple, no goiter  Cardiovascular: RRR, no MRG  Respiratory: CTA BL, no wheezes  Abdomen: soft, reported discomfort with palpation (generalized) although no grimacing, no cullens or turner sign, + bowel sounds  Skin: warm and dry  Musculoskeletal: no cyanosis or clubbing  Psychiatric: overly emotional and tearful  Neurologic: answers questions appropriately  Labs on Admission:  Basic Metabolic Panel:  Recent Labs Lab 09/30/13 2210 10/01/13 0135 10/01/13 0740  NA 137 137 138  K 4.2 4.5 4.4  CL 98 103 98  CO2 19 15* 21  GLUCOSE 184* 202* 272*  BUN 21 22 20   CREATININE 0.63 0.51 0.59  CALCIUM 10.7* 9.4 10.1   Liver Function Tests:  Recent Labs Lab 09/30/13 2210  AST 83*  ALT 71*  ALKPHOS 115  BILITOT 0.4  PROT 8.7*  ALBUMIN 4.0    Recent Labs Lab 09/30/13 2210  LIPASE 19   No results found for this basename: AMMONIA,  in the last 168 hours CBC:  Recent Labs Lab 09/30/13 2210 10/01/13 0643  WBC 14.0* 12.8*  NEUTROABS 8.4*  --   HGB 14.1 13.4  HCT 44.7 41.5  MCV 72.2* 71.8*  PLT 178 PLATELET CLUMPS NOTED ON SMEAR, COUNT APPEARS ADEQUATE   Cardiac  Enzymes:  Recent Labs Lab 10/01/13 0740  TROPONINI <0.30   BNP: No components found with this basename: POCBNP,  CBG:  Recent Labs Lab 09/30/13 2153 10/01/13 0641 10/01/13 0906  GLUCAP 189* 231* 244*    Radiological Exams on Admission: No results found.   Time spent: > 35 minutes  [facility] Pager [phone]  If 7PM-7AM, please contact night-coverage [email or web address] Password [email or web address] 10/01/2013, 9:56 AM

## 2013-10-01 NOTE — ED Provider Notes (Signed)
Medical screening examination/treatment/procedure(s) were performed by non-physician practitioner and as supervising physician I was immediately available for consultation/collaboration.   EKG Interpretation None        Wynetta Fines, MD 10/01/13 [phone]

## 2013-10-01 NOTE — Discharge Instructions (Signed)
Please call your doctor for a followup appointment within 24-48 hours. When you talk to your doctor please let them know that you were seen in the emergency department and have them acquire all of your records so that they can discuss the findings with you and formulate a treatment plan to fully care for your new and ongoing problems. Please call and set-up an appointment with your primary care provider Please take medications as prescribed Please stay with a clear liquid diet Please continue to monitor glucose daily Please avoid fatty greasy foods Please hold metformin and take Novolin 35 Units daily for glucose control Please continue to monitor symptoms closely and if symptoms are to worsen or change (fever greater than 101, chills, chest pain, shortness of breath, difficulty breathing, numbness, tingling, worsening symptoms, inability to keep food and fluids down, worsening or changes to abdominal pain, blood in stools, black tarry stools, elevated glucose levels, headache, dizziness, fainting) please report back to the ED immediately   Abdominal Pain, Women Abdominal (stomach, pelvic, or belly) pain can be caused by many things. It is important to tell your doctor:  The location of the pain.  Does it come and go or is it present all the time?  Are there things that start the pain (eating certain foods, exercise)?  Are there other symptoms associated with the pain (fever, nausea, vomiting, diarrhea)? All of this is helpful to know when trying to find the cause of the pain. CAUSES   Stomach: virus or bacteria infection, or ulcer.  Intestine: appendicitis (inflamed appendix), regional ileitis (Crohn's disease), ulcerative colitis (inflamed colon), irritable bowel syndrome, diverticulitis (inflamed diverticulum of the colon), or cancer of the stomach or intestine.  Gallbladder disease or stones in the gallbladder.  Kidney disease, kidney stones, or infection.  Pancreas infection or  cancer.  Fibromyalgia (pain disorder).  Diseases of the female organs:  Uterus: fibroid (non-cancerous) tumors or infection.  Fallopian tubes: infection or tubal pregnancy.  Ovary: cysts or tumors.  Pelvic adhesions (scar tissue).  Endometriosis (uterus lining tissue growing in the pelvis and on the pelvic organs).  Pelvic congestion syndrome (female organs filling up with blood just before the menstrual period).  Pain with the menstrual period.  Pain with ovulation (producing an egg).  Pain with an IUD (intrauterine device, birth control) in the uterus.  Cancer of the female organs.  Functional pain (pain not caused by a disease, may improve without treatment).  Psychological pain.  Depression. DIAGNOSIS  Your doctor will decide the seriousness of your pain by doing an examination.  Blood tests.  X-rays.  Ultrasound.  CT scan (computed tomography, special type of X-ray).  MRI (magnetic resonance imaging).  Cultures, for infection.  Barium enema (dye inserted in the large intestine, to better view it with X-rays).  Colonoscopy (looking in intestine with a lighted tube).  Laparoscopy (minor surgery, looking in abdomen with a lighted tube).  Major abdominal exploratory surgery (looking in abdomen with a large incision). TREATMENT  The treatment will depend on the cause of the pain.   Many cases can be observed and treated at home.  Over-the-counter medicines recommended by your caregiver.  Prescription medicine.  Antibiotics, for infection.  Birth control pills, for painful periods or for ovulation pain.  Hormone treatment, for endometriosis.  Nerve blocking injections.  Physical therapy.  Antidepressants.  Counseling with a psychologist or psychiatrist.  Minor or major surgery. HOME CARE INSTRUCTIONS   Do not take laxatives, unless directed by your caregiver.  Take over-the-counter pain medicine only if ordered by your caregiver. Do not  take aspirin because it can cause an upset stomach or bleeding.  Try a clear liquid diet (broth or water) as ordered by your caregiver. Slowly move to a bland diet, as tolerated, if the pain is related to the stomach or intestine.  Have a thermometer and take your temperature several times a day, and record it.  Bed rest and sleep, if it helps the pain.  Avoid sexual intercourse, if it causes pain.  Avoid stressful situations.  Keep your follow-up appointments and tests, as your caregiver orders.  If the pain does not go away with medicine or surgery, you may try:  Acupuncture.  Relaxation exercises (yoga, meditation).  Group therapy.  Counseling. SEEK MEDICAL CARE IF:   You notice certain foods cause stomach pain.  Your home care treatment is not helping your pain.  You need stronger pain medicine.  You want your IUD removed.  You feel faint or lightheaded.  You develop nausea and vomiting.  You develop a rash.  You are having side effects or an allergy to your medicine. SEEK IMMEDIATE MEDICAL CARE IF:   Your pain does not go away or gets worse.  You have a fever.  Your pain is felt only in portions of the abdomen. The right side could possibly be appendicitis. The left lower portion of the abdomen could be colitis or diverticulitis.  You are passing blood in your stools (bright red or black tarry stools, with or without vomiting).  You have blood in your urine.  You develop chills, with or without a fever.  You pass out. MAKE SURE YOU:   Understand these instructions.  Will watch your condition.  Will get help right away if you are not doing well or get worse. Document Released: 03/31/2007 Document Revised: 08/26/2011 Document Reviewed: 04/20/2009 [facility] Patient Information 2014 [place], [place].  Glucose, Blood Sugar, Fasting Blood Sugar This is a test to measure your blood sugar. Glucose is a simple sugar that serves as the main source of energy  for the body. The carbohydrates we eat are broken down into glucose (and a few other simple sugars), absorbed by the small intestine, and circulated throughout the body. Most of the body's cells require glucose for energy production; brain and nervous system cells not only rely on glucose for energy, they can only function when glucose levels in the blood remain above a certain level.  The body's use of glucose hinges on the availability of insulin, a hormone produced by the pancreas. Insulin acts as a Control and instrumentation engineer, transporting glucose into the body's cells, directing the body to store excess glucose as glycogen (for short-term storage) and/or as triglycerides in fat cells. We can not live without glucose or insulin, and they must be in balance.  Normally, blood glucose levels rise slightly after a meal, and insulin is secreted to lower them, with the amount of insulin released matched up with the size and content of the meal. If blood glucose levels drop too low, such as might occur in between meals or after a strenuous workout, glucagon (another pancreatic hormone) is secreted to tell the liver to turn some glycogen back into glucose, raising the blood glucose levels. If the glucose/insulin feedback mechanism is working properly, the amount of glucose in the blood remains fairly stable. If the balance is disrupted and glucose levels in the blood rise, then the body tries to restore the balance, both by increasing insulin  production and by excreting glucose in the urine.  PREPARATION FOR TEST A blood sample drawn from a vein in your arm or, for a self check, a drop of blood from a skin prick; in general, it may be recommended that you fast before having a blood glucose test; sometimes a random (no preparation) urine sample is used. Your caregiver will instruct you as to what they want prior to your testing. NORMAL FINDINGS Normal values depend on many factors. Your lab will provide a range of normal  values with your test results. The following information summarizes the meaning of the test results. These are based on the clinical practice recommendations of the American Diabetes Association.  FASTING BLOOD GLUCOSE  From 70 to 99 mg/dL (3.9 to 5.5 mmol/L): Normal glucose tolerance  From 100 to 125 mg/dL (5.6 to 6.9 mmol/L):Impaired fasting glucose (pre-diabetes)  126 mg/dL (7.0 mmol/L) and above on more than one testing occasion: Diabetes ORAL GLUCOSE TOLERANCE TEST (OGTT) [EXCEPT PREGNANCY] (2 HOURS AFTER A 75-GRAM GLUCOSE DRINK)  Less than 140 mg/dL (7.8 mmol/L): Normal glucose tolerance  From 140 to 200 mg/dL (7.8 to 11.1 mmol/L): Impaired glucose tolerance (pre-diabetes)  Over 200 mg/dL (11.1 mmol/L) on more than one testing occasion: Diabetes GESTATIONAL DIABETES SCREENING: GLUCOSE CHALLENGE TEST (1 HOUR AFTER A 50-GRAM GLUCOSE DRINK)  Less than 140* mg/dL (7.8 mmol/L): Normal glucose tolerance  140* mg/dL (7.8 mmol/L) and over: Abnormal, needs OGTT (see below) * Some use a cutoff of More Than 130 mg/dL (7.2 mmol/L) because that identifies 90% of women with gestational diabetes, compared to 80% identified using the threshold of More Than 140 mg/dL (7.8 mmol/L). GESTATIONAL DIABETES DIAGNOSTIC: OGTT (100-GRAM GLUCOSE DRINK)  Fasting*..........................................95 mg/dL (5.3 mmol/L)  1 hour after glucose load*..............180 mg/dL (10.0 mmol/L)  2 hours after glucose load*.............155 mg/dL (8.6 mmol/L)  3 hours after glucose load* **.........140 mg/dL (7.8 mmol/L) * If two or more values are above the criteria, gestational diabetes is diagnosed. ** A 75-gram glucose load may be used, although this method is not as well validated as the 100-gram OGTT; the 3-hour sample is not drawn if 75 grams is used.  Ranges for normal findings may vary among different laboratories and hospitals. You should always check with your doctor after having lab work or other tests  done to discuss the meaning of your test results and whether your values are considered within normal limits. MEANING OF TEST  Your caregiver will go over the test results with you and discuss the importance and meaning of your results, as well as treatment options and the need for additional tests if necessary. OBTAINING THE TEST RESULTS It is your responsibility to obtain your test results. Ask the lab or department performing the test when and how you will get your results. Document Released: 07/05/2004 Document Revised: 08/26/2011 Document Reviewed: 05/14/2008 [facility] Patient Information 2014 [place], [place]. Diet The clear liquid diet consists of foods that are liquid or will become liquid at room temperature. Examples of foods allowed on a clear liquid diet include fruit juice, broth or bouillon, gelatin, or frozen ice pops. You should be able to see through the liquid. The purpose of this diet is to provide the necessary fluids, electrolytes (such as sodium and potassium), and energy to keep the body functioning during times when you are not able to consume a regular diet. A clear liquid diet should not be continued for long periods of time, as it is not nutritionally adequate.  A CLEAR LIQUID DIET  MAY BE NEEDED:  When a sudden-onset (acute) condition occurs before or after surgery.   As the first step in oral feeding.   For fluid and electrolyte replacement in diarrheal diseases.   As a diet before certain medical tests are performed.  ADEQUACY The clear liquid diet is adequate only in ascorbic acid, according to the Recommended Dietary Allowances of the [employer].  CHOOSING FOODS Breads and Starches  Allowed: None are allowed.   Avoid: All are to be avoided.  Vegetables  Allowed: Strained vegetable juices.   Avoid: Any others.  Fruit  Allowed: Strained fruit juices and fruit drinks. Include 1 serving of citrus or vitamin C-enriched fruit  juice daily.   Avoid: Any others.  Meat and Meat Substitutes  Allowed: None are allowed.   Avoid: All are to be avoided.  Milk Products  Allowed: None are allowed.   Avoid: All are to be avoided.  Soups and Combination Foods  Allowed: Clear bouillon, broth, or strained broth-based soups.   Avoid: Any others.  Desserts and Sweets  Allowed: Sugar, honey. High-protein gelatin. Flavored gelatin, ices, or frozen ice pops that do not contain milk.   Avoid: Any others.  Fats and Oils  Allowed: None are allowed.   Avoid: All are to be avoided.  Beverages  Allowed: Cereal beverages, coffee (regular or decaffeinated), tea, or soda at the discretion of your health care provider.   Avoid: Any others.  Condiments  Allowed: Salt.   Avoid: Any others, including pepper.  Supplements  Allowed: Liquid nutrition beverages that you can see through.   Avoid: Any others that contain lactose or fiber. SAMPLE MEAL PLAN Breakfast  4 oz (120 mL) strained orange juice.   to 1 cup (120 to 240 mL) gelatin (plain or fortified).  1 cup (240 mL) beverage (coffee or tea).  Sugar, if desired. Midmorning Snack   cup (120 mL) gelatin (plain or fortified). Lunch  1 cup (240 mL) broth or consomm.  4 oz (120 mL) strained grapefruit juice.   cup (120 mL) gelatin (plain or fortified).  1 cup (240 mL) beverage (coffee or tea).  Sugar, if desired. Midafternoon Snack   cup (120 mL) fruit ice.   cup (120 mL) strained fruit juice. Dinner  1 cup (240 mL) broth or consomm.   cup (120 mL) cranberry juice.   cup (120 mL) flavored gelatin (plain or fortified).  1 cup (240 mL) beverage (coffee or tea).  Sugar, if desired. Evening Snack  4 oz (120 mL) strained apple juice (vitamin C-fortified).   cup (120 mL) flavored gelatin (plain or fortified). MAKE SURE YOU:  Understand these instructions.  Will watch your child's  condition.  Will get help right away if your child is not doing well or gets worse. Document Released: 06/03/2005 Document Revised: 02/03/2013 Document Reviewed: 11/03/2012 [facility] Patient Information 2014 [place], [place].   Emergency Department Resource Guide 1) Find a Doctor and Pay Out of Pocket Although you won't have to find out who is covered by your insurance plan, it is a good idea to ask around and get recommendations. You will then need to call the office and see if the doctor you have chosen will accept you as a new patient and what types of options they offer for patients who are self-pay. Some doctors offer discounts or will set up payment plans for their patients who do not have insurance, but you will need to ask so you aren't surprised when you get to  your appointment.  2) Contact Your Local Health Department Not all health departments have doctors that can see patients for sick visits, but many do, so it is worth a call to see if yours does. If you don't know where your local health department is, you can check in your phone book. The CDC also has a tool to help you locate your state's health department, and many state websites also have listings of all of their local health departments.  3) Find a [facility] If your illness is not likely to be very severe or complicated, you may want to try a walk in clinic. These are popping up all over the country in pharmacies, drugstores, and shopping centers. They're usually staffed by nurse practitioners or physician assistants that have been trained to treat common illnesses and complaints. They're usually fairly quick and inexpensive. However, if you have serious medical issues or chronic medical problems, these are probably not your best option.  No Primary Care Doctor: - Call Health Connect at  [phone] - they can help you locate a primary care doctor that  accepts your insurance, provides certain services, etc. - Physician  Referral Service- [phone]  Chronic Pain Problems: Organization         Address  Phone   Notes  [facility]  [phone] Patients need to be referred by their primary care doctor.   Medication Assistance: Organization         Address  Phone   Notes  [facility] Medication [facility] [address] [place], [place] [address] [phone] --Must be a resident of [facility] -- Must have NO insurance coverage whatsoever (no Medicaid/ Medicare, etc.) -- The pt. MUST have a primary care doctor that directs their care regularly and follows them in the community   MedAssist  [phone]   [employer]  [phone]    Agencies that provide inexpensive medical care: Organization         Address  Phone   Notes  [place]  [phone]   [facility]    [phone]   [facility] [place], [place] [address] [phone]   [place] [address] [place] [phone]   Planned Parenthood    [phone]   [facility]    [phone]   [place] and [address], [place] Phone:  [phone], Fax:  [phone] Hours of Operation:  9 am - 6 pm, M-F.  Also accepts Medicaid/Medicare and self-pay.  [facility] for [place] [address], [address], [place] Phone: [phone], Fax: [phone]. Hours of Operation:  8:30 am - 5:30 pm, M-F.  Also accepts Medicaid and self-pay.  [facility] [address], [place] Phone: [phone]   [place], [place], [place] [phone], Ext. [phone] Mondays & Thursdays: 7-9 AM.  First 15 patients are seen on a first come, first serve basis.    [place] Providers:  Organization         Address  Phone   Notes  [facility] [address], [place] [phone] Also accepts self-pay patients.  [place], [place], [place]  [phone]   [place]  [place], [place] [phone]   [place] [address] [place] [phone]   Lucianne Lei [address], [place]   [phone] Only accepts [place] Access [place] patients after they have their name applied to their card.   Self-Pay (no insurance) in [facility]:  Organization         Address  Phone   Notes  Sickle Cell Patients, [facility] [place] [phone]   [facility] [place] [phone]   [facility] [place]  [place], [place], [place] [phone]   [facility]/Dr. Osei-Bonsu  [address], [place] or [address], [place] [phone] Phone number for both [place] and [place] locations is the same.  Urgent Medical and [facility] [address] [place] [phone]   [facility] [address] [place] or [address] [phone]   [facility] [address], [place] [phone], phone; [phone], fax Sees patients 1st and 3rd Saturday of every month.  Must not qualify for public or private insurance (i.e. Medicaid, Medicare, [place] Health Choice, Veterans' Benefits)  Household income should be no more than 200% of the poverty level The clinic cannot treat you if you are pregnant or think you are pregnant  Sexually transmitted diseases are not treated at the clinic.    Dental Care: Organization         Address  Phone  Notes  [facility] Department of [facility] [place] [phone] Accepts children up to age 62 who are enrolled  in [place] or [place]; pregnant women with a Medicaid card; and children who have applied for Medicaid or [place] Health Choice, but were declined, whose parents can pay a reduced fee at time of service.  [facility] Department of [facility]  [address], [place] [phone] Accepts children up to age 7 who are enrolled in [place] or [place]; pregnant women with a Medicaid card; and children who have applied for Medicaid or [place] Health Choice, but were declined, whose parents can pay a reduced fee at time of service.  [facility]  [place] [phone] Patients are seen by appointment only. Walk-ins are not accepted. [place] will see patients 59 years of age and older. Monday - Tuesday (8am-5pm) Most Wednesdays (8:30-5pm) $30 per visit, cash only  [facility] Adult Dental Access PROGRAM  [address], [facility] [phone] Patients are seen by appointment only. Walk-ins are not accepted. [place] will see patients 49 years of age and older. One Wednesday Evening (Monthly: Volunteer Based).  $30 per visit, cash only  [place]  [phone] for adults; Children under age 62, call Graduate Pediatric Dentistry at [phone]. Children aged 32-14, please call [phone] to request a pediatric application.  Dental services are provided in all areas of dental care including fillings, crowns and bridges, complete and partial dentures, implants, gum treatment, root canals, and extractions. Preventive care is also provided. Treatment is provided to both adults and children. Patients are selected via a lottery and there is often a waiting list.   [facility] [address], [place]  [phone] [email or web address]   [facility] [address] [place], [place] [phone], Ext. [phone] Second and Fourth Thursday of each month, opens at  6:30 AM; Clinic ends at 9 AM.  Patients are seen on a first-come first-served basis, and a limited number are seen during each clinic.   [facility]  [address] Hillard Danker [place], [place] [phone]   Eligibility Requirements You must have lived in [place], [place], or [place] counties for at least the last three months.   You cannot be eligible for state or federal sponsored [employer], including [employer], [place], or [employer].   You generally cannot be eligible for healthcare insurance through your employer.    How to apply: Eligibility screenings are held every Tuesday and Wednesday afternoon from 1:00 pm until 4:00 pm. You do not need an appointment for the interview!  [facility] [address], [place], [place]   [place]  [place] Department  [place]  [phone]    Behavioral Health Resources in the Community: Intensive Outpatient Programs Organization         Address  Phone  Notes  [place]. [address] [place], [place] [phone]   [facility] Outpatient [address] [place], [place]   ADS: Alcohol & Drug S[facility] [address] [place], [place]   [place] [address]  [place], [place] or [phone]   Substance Abuse Resources Organization         Address  Phone  Notes  Alcohol and Drug Services  [phone]   [place]  [phone]   [facility] [employer]   Chinita Pester  [phone]   Residential & Outpatient Substance Abuse Program  [phone]   Psychological Services Organization         Address  Phone  Notes  [facility] [place]  [place]  [phone]   [place] [address], [place] or [phone]    Mobile Crisis Teams Organization         Address  Phone  Notes  [facility], Mobile Crisis Care Unit  [phone]   Assertive Psychotherapeutic Services  [address] [place], [place]   [place] [address] [place] [phone]    Self-Help/Support Groups Organization         Address  Phone             Notes  . of [place] - variety of support groups  [place] Call for more information  Narcotics Anonymous (NA), [facility] [address], [employer] [place]  2 meetings at this location   Special educational needs teacher         Address  Phone  Notes  [facility] Residential Treatment [place],    [place]  [phone]   [facility]  [address] [place] [phone], [place], [place]   [place], [place] [phone] Admissions: 8am-3pm M-F  Incentives Substance [place] [address],    [place], [place] [phone]   [facility] [address] [place], [place], [place]   [facility] [address],  [place], [place]  [facility]ight Programs - Intensive Outpatient [address], [place], [place]   [facility] ([place].,  [place], [place] [phone] or [phone]   Residential Treatment Services (RTS) [address], [place], [place] Center Accepts Medicaid  [facility] [address],  [place] [phone] Substance Abuse/Addiction Treatment   [facility] Organization         Address  Phone  Notes  [facility]  [phone]   Domenic Schwab, PhD [address] [place], [place]   [phone] or [phone]   [place], [place] [phone]   [place] [address], [place], [place] [phone] Insurance/Medicaid/sponsorship through [facility] [address], [address]                                    [place], [place] [phone] [place] [address], [place] [phone]    Dr. Adele Schilder  [phone]   [facility] Dept. 1) [address] [place] 2) [place] 3)  [address], [place] [phone]  [phone]   [place] [phone] or [phone] (After Hours)

## 2013-10-01 NOTE — ED Provider Notes (Signed)
Medical screening examination/treatment/procedure(s) were performed by non-physician practitioner and as supervising physician I was immediately available for consultation/collaboration.   EKG Interpretation None        Wynetta Fines, MD 10/01/13 [phone]

## 2013-10-01 NOTE — ED Notes (Signed)
Pt reports relief from nausea but unchanged pain response. Marissa PA notified.

## 2013-10-01 NOTE — ED Notes (Signed)
Per Estill Bamberg from lab, CBC was drawn at [phone] and is in lab at present time.

## 2013-10-01 NOTE — Progress Notes (Signed)
[facility] provided pt with a list of primary care resources and a [facility] [employer] application. Patient gccn discount expired on 06/16/13. Encouraged patient to re-enroll and continue to see PCP at [facility] at [facility].

## 2013-10-01 NOTE — ED Notes (Addendum)
Pt reports upper abdominal pain and lower back pain. Pt reports pain 10/10 with n/v. Pt reports normal stool yesterday.   At present time, IV insertion successful per Zavitz MD. Pt has emesis bag with small amount of vomit in bag, vomit is present. Pt encouraged to void when able for urine specimen.

## 2013-10-01 NOTE — ED Notes (Signed)
Stephanie phlebotomy at bedside at present time to draw [facility].

## 2013-10-01 NOTE — ED Notes (Signed)
Pt reports able to keep down ice water. Pt requests ice chips at present time.

## 2013-10-01 NOTE — ED Provider Notes (Signed)
Repeat labs show a gap of 12 which is na improvement patient will be DC home   Garald Balding, NP 10/01/13 [phone]

## 2013-10-01 NOTE — ED Notes (Signed)
Per pt report: pt c/o of abd pain and nausea that was the same as a few hours ago when she was seen in this department.  Pt reports pain is worse.  Pt observed retching but only saliva was in the bag.

## 2013-10-01 NOTE — ED Notes (Addendum)
Pt unable to void at present time.

## 2013-10-01 NOTE — ED Notes (Signed)
Pt transferred to XRAY at present time.

## 2013-10-01 NOTE — Discharge Instructions (Signed)
Follow p with your PCP as needed

## 2013-10-01 NOTE — ED Notes (Signed)
Pt reports slight nausea, no vomiting since 0746.

## 2013-10-01 NOTE — ED Notes (Signed)
Per Judson Roch from main lab someone will come to attempt lab draw. Unsuccessful lab draw attempts by this RN and John P NT.

## 2013-10-01 NOTE — ED Provider Notes (Signed)
CSN: [id number]     Arrival date & time 10/01/13  0556 History   First MD Initiated Contact with Patient 10/01/13 [phone]     Chief Complaint  Patient presents with  . Emesis     (Consider location/radiation/quality/duration/timing/severity/associated sxs/prior Treatment) The history is provided by the patient. No language interpreter was used.  Mary [name] is a 43 y/o F with PMHX of IDDM, seizures, heart murmur, gastritis, Bipolar 2 disorder, PTSD, DKA, depression, Hepatitis C, s/p cholecystectomy presenting to the ED with nausea and vomiting. Patient reported that upon being discharged patient she started to feel nauseous again and had at least 8 episodes of emesis. Reported that she continues to have abdominal pain, worse with nausea - described the discomfort to be a constant dull sharp pain that radiates towards her back. Reported that she has been having ongoing pain since Thursday. When asked if she has brought this issue to the attention to her PCP, patient reported that she has not. Stated that she has been using nothing for the pain. Denied fever, chills, chest pain, shortness of breath, difficulty breathing, diarrhea, melena, hematochezia, numbness, tingling.  PCP Dr. Rowe Robert  Past Medical History  Diagnosis Date  . Kidney stone   . Seizure disorder     started with pregnancy of first son  . Gastritis   . Bipolar 2 disorder   . Post traumatic stress disorder     "flipping out" after people close to her died  . Heart murmur   . Arthritis     r ankle-S/P surgery (2007)  . Anemia     childhood  . Depression     childhood  . Hepatitis C     biopsy in 2010-no rx-was supposed to see a hepatologist in [place].  With cirrhosis  . Neuropathy     in legs and feet and hands  . Cataracts, bilateral   . Scoliosis   . Diabetes mellitus     diagnosed in 1996-always been on insulin  . DKA (diabetic ketoacidoses)     Recurrent admissions for DKA, medication non-complaince, poor  social situation  . Diabetic neuropathy, type I diabetes mellitus     numbness bilaterally feet   Past Surgical History  Procedure Laterality Date  . Ankle surgery  2007  . Endometrial biopsy  06/22/2012  . Cholecystectomy  04/07/2013  . Cholecystectomy N/A 04/07/2013    Procedure: LAPAROSCOPIC CHOLECYSTECTOMY WITH INTRAOPERATIVE CHOLANGIOGRAM;  Surgeon: Adin Hector, MD;  Location: [facility] ORS;  Service: General;  Laterality: N/A;  . Cesarean section      3 times   Family History  Problem Relation Age of Onset  . Diabetes Mother     currently 24  . Fibromyalgia Mother   . Cirrhosis Father     died in 84  . Diabetes Maternal Grandmother   . Diabetes Maternal Aunt    History  Substance Use Topics  . Smoking status: Current Every Day Smoker -- 0.10 packs/day for 25 years    Types: Cigarettes  . Smokeless tobacco: Never Used  . Alcohol Use: No     Comment: Endorses hasn't been drinking   OB History   Grav Para Term Preterm Abortions TAB SAB Ect Mult Living   4 3 3  0 1 0 1 0 1 2     Review of Systems  Constitutional: Negative for fever and chills.  Gastrointestinal: Positive for nausea, vomiting and abdominal pain. Negative for diarrhea, constipation, blood in stool and anal bleeding.  Genitourinary: Negative for dysuria and decreased urine volume.  Musculoskeletal: Positive for back pain. Negative for neck pain.  Neurological: Negative for weakness.  All other systems reviewed and are negative.     Allergies  Penicillins  Home Medications   Prior to Admission medications   Medication Sig Start Date End Date Taking? Authorizing Provider  aspirin EC 81 MG tablet Take 81 mg by mouth daily.     Historical Provider, MD  calcium carbonate (TUMS - DOSED IN MG ELEMENTAL CALCIUM) 500 MG chewable tablet Chew 2 tablets by mouth daily as needed. For heartburn    Historical Provider, MD  dicyclomine (BENTYL) 20 MG tablet Take 1 tablet (20 mg total) by mouth every 6 (six) hours  as needed for spasms (for abdominal cramping). 05/15/13   Domenic Moras, PA-C  diphenhydrAMINE (BENADRYL) 25 MG tablet Take with reglan as needed 04/21/13   Kalman Drape, MD  gabapentin (NEURONTIN) 400 MG capsule Take 600 mg by mouth 3 (three) times daily.     Historical Provider, MD  hydrochlorothiazide (MICROZIDE) 12.5 MG capsule Take 12.5 mg by mouth daily.    Historical Provider, MD  HYDROcodone-acetaminophen (NORCO/VICODIN) 5-325 MG per tablet Take 1-2 tablets by mouth every 4 (four) hours as needed. 04/08/13   Earnstine Regal, PA-C  insulin glargine (LANTUS) 100 UNIT/ML injection Inject 50 Units into the skin at bedtime.    Historical Provider, MD  medroxyPROGESTERone (PROVERA) 10 MG tablet Take 2 tablets (20 mg total) by mouth daily. 04/15/13   Woodroe Mode, MD  meloxicam (MOBIC) 15 MG tablet Take 15 mg by mouth daily.     Historical Provider, MD  metFORMIN (GLUCOPHAGE) 1000 MG tablet Take 1,000 mg by mouth 2 (two) times daily.    Historical Provider, MD  metoCLOPramide (REGLAN) 10 MG tablet Take 1 tablet (10 mg total) by mouth 4 (four) times daily -  before meals and at bedtime. 04/21/13   Kalman Drape, MD  Multiple Vitamin (MULTIVITAMIN WITH MINERALS) TABS tablet Take 1 tablet by mouth daily.    Historical Provider, MD  promethazine (PHENERGAN) 25 MG suppository Place 1 suppository (25 mg total) rectally every 6 (six) hours as needed for nausea or vomiting. 05/15/13   Domenic Moras, PA-C  ranitidine (ZANTAC) 150 MG tablet Take 150 mg by mouth 2 (two) times daily.    Historical Provider, MD  risperidone (RISPERDAL) 4 MG tablet Take 2 mg by mouth at bedtime.     Historical Provider, MD  sitaGLIPtin (JANUVIA) 100 MG tablet Take 100 mg by mouth daily.    Historical Provider, MD  sulfamethoxazole-trimethoprim (BACTRIM DS) 800-160 MG per tablet Take 1 tablet by mouth 2 (two) times daily. 05/10/13   Historical Provider, MD   BP 116/62  Pulse 91  Temp(Src) 99 F (37.2 C) (Oral)  Resp 16  SpO2  100% Physical Exam  Nursing note and vitals reviewed. Constitutional: She is oriented to person, place, and time. She appears well-developed and well-nourished. No distress.  HENT:  Head: Normocephalic and atraumatic.  Mouth/Throat: Oropharynx is clear and moist. No oropharyngeal exudate.  Eyes: Conjunctivae and EOM are normal. Pupils are equal, round, and reactive to light. Right eye exhibits no discharge. Left eye exhibits no discharge.  Neck: Normal range of motion. Neck supple. No tracheal deviation present.  Cardiovascular: Normal rate, regular rhythm and normal heart sounds.  Exam reveals no friction rub.   No murmur heard. Pulmonary/Chest: Effort normal and breath sounds normal. No respiratory distress. She has no  wheezes. She has no rales.  Abdominal: Soft. Bowel sounds are normal. She exhibits no distension. There is tenderness. There is no rebound and no guarding.  Generalized tenderness upon palpation to the abdomen Soft upon palpation Negative rigidity  Negative peritoneal signs   Musculoskeletal: Normal range of motion.  Full ROM to upper and lower extremities without difficulty noted, negative ataxia noted.  Lymphadenopathy:    She has no cervical adenopathy.  Neurological: She is alert and oriented to person, place, and time. No cranial nerve deficit. She exhibits normal muscle tone. Coordination normal.  Skin: Skin is warm and dry. No rash noted. She is not diaphoretic. No erythema.  Psychiatric: She has a normal mood and affect. Her behavior is normal. Thought content normal.    ED Course  Procedures (including critical care time)  Dr. Rosalyn Gess obtained peripheral line.   8:14 AM This provider was made aware that the patient had an episode of emesis shortly after IV zofran administered.   9:29 AM This provider spoke with Dr. Hillary Bow - discussed case, history, presentation, labs in great detail. Physician to come and assess patient before making decision of  admission.  9:47 AM This provider received phone call from Dr. Hillary Bow who reported that he saw and assessed the patient. Reported that he will wait until Acute Abd series results, reported that the abdomen was soft and does not believe further imaging is required. Reported that patient has drug seeking behavior. Reported that if plain film is okay he will discharge the patient with recommendations.  Physician reported that patient does not need to come into the hospital, reported that patient is cleared for discharge - made recommendations.   11:29 AM This provider called Dr. Pincus Large office-this is an alcohol and drug abuse office facility.   Results for orders placed during the hospital encounter of 10/01/13  URINALYSIS, ROUTINE W REFLEX MICROSCOPIC      Result Value Ref Range   Color, Urine YELLOW  YELLOW   APPearance CLEAR  CLEAR   Specific Gravity, Urine 1.037 (*) 1.005 - 1.030   pH 5.5  5.0 - 8.0   Glucose, UA >1000 (*) NEGATIVE mg/dL   Hgb urine dipstick NEGATIVE  NEGATIVE   Bilirubin Urine NEGATIVE  NEGATIVE   Ketones, ur >80 (*) NEGATIVE mg/dL   Protein, ur NEGATIVE  NEGATIVE mg/dL   Urobilinogen, UA 0.2  0.0 - 1.0 mg/dL   Nitrite NEGATIVE  NEGATIVE   Leukocytes, UA NEGATIVE  NEGATIVE  BASIC METABOLIC PANEL      Result Value Ref Range   Sodium 138  137 - 147 mEq/L   Potassium 4.4  3.7 - 5.3 mEq/L   Chloride 98  96 - 112 mEq/L   CO2 21  19 - 32 mEq/L   Glucose, Bld 272 (*) 70 - 99 mg/dL   BUN 20  6 - 23 mg/dL   Creatinine, Ser 0.59  0.50 - 1.10 mg/dL   Calcium 10.1  8.4 - 10.5 mg/dL   GFR calc non Af Amer >90  >90 mL/min   GFR calc Af Amer >90  >90 mL/min  BLOOD GAS, VENOUS      Result Value Ref Range   pH, Ven 7.329 (*) 7.250 - 7.300   pCO2, Ven 44.9 (*) 45.0 - 50.0 mmHg   pO2, Ven BELOW REPORTABLE RANGE.  30.0 - 45.0 mmHg   Bicarbonate 23.0  20.0 - 24.0 mEq/L   TCO2 21.1  0 - 100 mmol/L  Acid-base deficit 2.6 (*) 0.0 - 2.0 mmol/L   O2 Saturation 43.4      Patient temperature 98.6     Collection site COLLECTED BY NURSE     Drawn by COLLECTED BY NURSE     Sample type VEIN    CBC      Result Value Ref Range   WBC 12.8 (*) 4.0 - 10.5 K/uL   RBC 5.78 (*) 3.87 - 5.11 MIL/uL   Hemoglobin 13.4  12.0 - 15.0 g/dL   HCT 41.5  36.0 - 46.0 %   MCV 71.8 (*) 78.0 - 100.0 fL   MCH 23.2 (*) 26.0 - 34.0 pg   MCHC 32.3  30.0 - 36.0 g/dL   RDW 14.7  11.5 - 15.5 %   Platelets    150 - 400 K/uL   Value: PLATELET CLUMPS NOTED ON SMEAR, COUNT APPEARS ADEQUATE  TROPONIN I      Result Value Ref Range   Troponin I <0.30  <0.30 ng/mL  URINE MICROSCOPIC-ADD ON      Result Value Ref Range   Squamous Epithelial / LPF RARE  RARE   WBC, UA 0-2  <3 WBC/hpf  CBG MONITORING, ED      Result Value Ref Range   Glucose-Capillary 231 (*) 70 - 99 mg/dL  CBG MONITORING, ED      Result Value Ref Range   Glucose-Capillary 244 (*) 70 - 99 mg/dL  CBG MONITORING, ED      Result Value Ref Range   Glucose-Capillary 224 (*) 70 - 99 mg/dL  I-STAT CHEM 8, ED      Result Value Ref Range   Sodium 143  137 - 147 mEq/L   Potassium 4.0  3.7 - 5.3 mEq/L   Chloride 108  96 - 112 mEq/L   BUN 20  6 - 23 mg/dL   Creatinine, Ser 0.50  0.50 - 1.10 mg/dL   Glucose, Bld 237 (*) 70 - 99 mg/dL   Calcium, Ion 1.18  1.12 - 1.23 mmol/L   TCO2 20  0 - 100 mmol/L   Hemoglobin 13.3  12.0 - 15.0 g/dL   HCT 39.0  36.0 - 46.0 %  CBG MONITORING, ED      Result Value Ref Range   Glucose-Capillary 192 (*) 70 - 99 mg/dL     Labs Review Labs Reviewed  URINALYSIS, ROUTINE W REFLEX MICROSCOPIC - Abnormal; Notable for the following:    Specific Gravity, Urine 1.037 (*)    Glucose, UA >1000 (*)    Ketones, ur >80 (*)    All other components within normal limits  BASIC METABOLIC PANEL - Abnormal; Notable for the following:    Glucose, Bld 272 (*)    All other components within normal limits  BLOOD GAS, VENOUS - Abnormal; Notable for the following:    pH, Ven 7.329 (*)    pCO2, Ven 44.9 (*)     Acid-base deficit 2.6 (*)    All other components within normal limits  CBC - Abnormal; Notable for the following:    WBC 12.8 (*)    RBC 5.78 (*)    MCV 71.8 (*)    MCH 23.2 (*)    All other components within normal limits  CBG MONITORING, ED - Abnormal; Notable for the following:    Glucose-Capillary 231 (*)    All other components within normal limits  CBG MONITORING, ED - Abnormal; Notable for the following:    Glucose-Capillary 244 (*)  All other components within normal limits  CBG MONITORING, ED - Abnormal; Notable for the following:    Glucose-Capillary 224 (*)    All other components within normal limits  I-STAT CHEM 8, ED - Abnormal; Notable for the following:    Glucose, Bld 237 (*)    All other components within normal limits  CBG MONITORING, ED - Abnormal; Notable for the following:    Glucose-Capillary 192 (*)    All other components within normal limits  TROPONIN I  URINE MICROSCOPIC-ADD ON    Imaging Review Dg Abd Acute W/chest  10/01/2013   CLINICAL DATA:  Upper abdominal pain  EXAM: ACUTE ABDOMEN SERIES (ABDOMEN 2 VIEW & CHEST 1 VIEW)  COMPARISON:  DG ABDOMEN 1V dated 08/12/2013; DG CHEST 1V dated 06/25/2013; CT ABD-PELV W/ CM dated 06/26/2013  FINDINGS: There is no evidence of dilated bowel loops or free intraperitoneal air. No radiopaque calculi or other significant radiographic abnormality is seen. Heart size and mediastinal contours are within normal limits. Both lungs are clear. S shaped curvature of the thoracolumbar spine is noted, which may be positional.  IMPRESSION: Negative abdominal radiographs.  No acute cardiopulmonary disease.   Electronically Signed   By: Conchita Paris M.D.   On: 10/01/2013 10:18     EKG Interpretation None      Date: 10/01/2013  Rate: 94  Rhythm: normal sinus rhythm  QRS Axis: normal  Intervals: normal  ST/T Wave abnormalities: normal  Conduction Disutrbances:none  Narrative Interpretation: Prolonged QT mild seen back  in 04/2013  Old EKG Reviewed: unchanged EKG analyzed and reviewed by this provider and attending physician.     MDM   Final diagnoses:  Hyperglycemia  Noncompliance  Abdominal pain   Medications  metoCLOPramide (REGLAN) injection 10 mg (10 mg Intramuscular Given 10/01/13 0707)  ondansetron (ZOFRAN) injection 4 mg (4 mg Intravenous Given 10/01/13 0754)  ketorolac (TORADOL) 30 MG/ML injection 30 mg (30 mg Intravenous Given 10/01/13 0754)  sodium chloride 0.9 % bolus 500 mL (0 mLs Intravenous Stopped 10/01/13 0849)  fentaNYL (SUBLIMAZE) injection 12.5 mcg (12.5 mcg Intravenous Given 10/01/13 0924)  sodium chloride 0.9 % bolus 500 mL (0 mLs Intravenous Stopped 10/01/13 0943)  ondansetron (ZOFRAN) injection 4 mg (4 mg Intravenous Given 10/01/13 1145)  sodium chloride 0.9 % bolus 500 mL (0 mLs Intravenous Stopped 10/01/13 1254)   Filed Vitals:   10/01/13 1200 10/01/13 1230 10/01/13 1300 10/01/13 1352  BP: 166/86 145/89 151/73 116/62  Pulse: 56 92 62 91  Temp:    99 F (37.2 C)  TempSrc:    Oral  Resp: 18 21 24 16   SpO2: 100% 99% 99% 100%    Patient presenting to the ED with abdominal pain that started Thursday generalized to the entire abdomen described as a sharp, dull pain. Reported that she has had nausea and emesis. Reported that she has used nothing for the pain. Patient reported that when she was discharged she continued to have nausea and at least 8 episodes of emesis.  This provider reviewed patient's chart. Patient was just discharged from the hospital earlier this morning. Bloods were drawn. Patient at first had an anion gap of 18.0 mEq per liter, after IV fluids were administered anion gap decreased to 12.0 mEq per liter. Negative elevation of lipase, lipase 19. Leukocytosis identified-this appears to be a continuous finding on patient's labs. Mild elevation of ALT and AST-another chronic finding on patient's labs. Hypercalcemia 10.7-1 compared to previous findings this is been  baseline. Urinalysis  small ketones - negative signs of infection. As per Dr. Michaele Offer reported that Hospitalist was spoken to about the patient regarding her conditions - reported that patient would not be admitted earlier this morning when patient was seen over night. Patient has had 5 CTs in the year 2014 with negative findings. EKG noted sinus rhythm with heart rate of 94 bpm with mild prolonged QT seen back in 04/2103 - negative ischemic changes noted. CBG 231. CBC noted elevated WBC of 12.8 - leukocytosis trend noted when compared to previous labs. BMP negative findings-anion gap of 19.0 mEq per liter, prior to discharge patient's anion gap was 12.0 mEq per liter, has now increased. Venous blood noted to have patient being basic with pH of 7.329, but closer to neutral state. Acute abdominal series negative for acute findings-no evidence of dilated bowel loops or free intraperitoneal air. Negative findings of cardiomegaly disease.  When observing patient for outside poor patient is resting comfortably. Once this provider walks into the room patient starts to wince and cry in pain.  Patient was seen and assessed by hospitalist who recommended patient to be discharged - did not recommend admission for patient.. Recommended patient to be discharged with either Phenergan or Zofran, recommended clear diet, recommended Novolin 35 units daily to be administered while patient is on clear diet. Recommended patient to be given Reglan to prevent gastroparesis diabetes. Recommended patient be followed up with primary care provider within the next 24-48 hours. 1:31 PM Repeat chem-8 with glucose of 237, anion gap decreased from 19.0 mEq per liter upon arrival to the ED to 15.0 mg as per liter. This provider tried to admit the patient, hospitalist saw and assess patient who do not agree to admission - recommended discharge. Recommendations found in hospitalists. Patient has been monitored in ED setting, negative episodes  of emesis for a least 4-5 hours. Patient able to tolerate fluids by mouth without episodes of emesis. Negative acute abdomen-soft, negative peritoneal signs. Patient stable, afebrile. Pulse ox approximately 99% on room air. Negative drop in blood pressure. Labs reviewed in great detail with attending. Patient seen and assessed by attending physician. Patient discharged. Discharge patient with recommendations as per hospitalists request. Discussed with patient to rest and stay hydrated, to stick with a clear diet for the next couple of days. Referred patient to health and wellness Center. Reported that she will be calling her physician as soon as possible to be seen and re-assessed. Discussed importance of glucose monitoring and glucose control. Referred patient to GI. Discussed with patient to closely monitor symptoms and if symptoms are to worsen or change to report back to the ED - strict return instructions given.  Patient agreed to plan of care, understood, all questions answered.    Jamse Mead, PA-C 10/01/13 1724  Jamse Mead, PA-C 10/02/13 0020  Jamse Mead, PA-C 10/02/13 0028  Jamse Mead, PA-C 10/02/13 0031

## 2013-10-03 NOTE — ED Provider Notes (Signed)
Medical screening examination/treatment/procedure(s) were conducted as a shared visit with non-physician practitioner(s) or resident and myself. I personally evaluated the patient during the encounter and agree with the findings and plan unless otherwise indicated.  I have personally reviewed any xrays and/ or EKG's with the provider and I agree with interpretation.  43 year old female with colicky medical history including noncompliance with medicines, smoking diabetes type 1 uncontrolled, DKA, nausea and vomiting presents with recurrent epigastric pain nausea and vomiting. Patient has had this multiple times in the past. Patient was just in the ER early this morning with similar symptoms and had blood work done however patient feels her symptoms are getting worse. Recent blood work was reviewed with mild decrease bicarbonate. Plan for basic labs, urine and EKG to look for DKA. With recurrent symptoms and balance back plan for observation in the hospital for fluids and antiemetics. On exam patient is dry mucous membranes, mild epigastric and central abdominal tenderness without guarding or rigidity, neck is supple and cranial nerves intact, no signs of meningitis, lungs are clear and no concerning rashes on exam. Pt has had GB removed and says hx of ulcer. Unknown gastroparesis. [facility] consulted in the ED for admission. Difficult IV, US guided by myself.  Emergency Ultrasound Study:  Angiocath insertion Performed by: Mariea Clonts  Consent: Verbal consent obtained. Risks and benefits: risks, benefits and alternatives were discussed Immediately prior to procedure the correct patient, procedure, equipment, support staff and site/side marked as needed.  Indication: difficult IV access  Preparation: Patient was prepped and draped in the usual sterile fashion.  Vein Location: right AC vein was visualized during assessment for potential access sites and was found to be patent/ easily compressed with linear  ultrasound. The needle was visualized with real-time ultrasound and guided into the vein.  Gauge: 20 g  Image saved and stored.  Normal blood return. Patient tolerance: Patient tolerated the procedure well with no immediate complications. Non compliance medication, intractable vomiting, DM, hyperglycemia, dehydration   Mariea Clonts, MD 10/03/13 2015

## 2013-10-04 NOTE — ED Provider Notes (Signed)
Medical screening examination/treatment/procedure(s) were performed by non-physician practitioner and as supervising physician I was immediately available for consultation/collaboration.   EKG Interpretation None       Anhar Mcdermott M Ekansh Sherk, MD 10/04/13 2230 

## 2013-10-05 ENCOUNTER — Encounter: Payer: Self-pay | Admitting: Gastroenterology

## 2013-10-11 ENCOUNTER — Encounter (HOSPITAL_COMMUNITY): Payer: Self-pay | Admitting: Emergency Medicine

## 2013-10-11 ENCOUNTER — Emergency Department (HOSPITAL_COMMUNITY)
Admission: EM | Admit: 2013-10-11 | Discharge: 2013-10-12 | Disposition: A | Discharge status: Home / Self Care | Payer: Self-pay | Attending: Emergency Medicine | Admitting: Emergency Medicine

## 2013-10-11 DIAGNOSIS — M19079 Primary osteoarthritis, unspecified ankle and foot: Secondary | ICD-10-CM | POA: Insufficient documentation

## 2013-10-11 DIAGNOSIS — H269 Unspecified cataract: Secondary | ICD-10-CM | POA: Insufficient documentation

## 2013-10-11 DIAGNOSIS — Z794 Long term (current) use of insulin: Secondary | ICD-10-CM | POA: Insufficient documentation

## 2013-10-11 DIAGNOSIS — Z8619 Personal history of other infectious and parasitic diseases: Secondary | ICD-10-CM | POA: Insufficient documentation

## 2013-10-11 DIAGNOSIS — E1142 Type 2 diabetes mellitus with diabetic polyneuropathy: Secondary | ICD-10-CM | POA: Insufficient documentation

## 2013-10-11 DIAGNOSIS — F3289 Other specified depressive episodes: Secondary | ICD-10-CM | POA: Insufficient documentation

## 2013-10-11 DIAGNOSIS — R011 Cardiac murmur, unspecified: Secondary | ICD-10-CM | POA: Insufficient documentation

## 2013-10-11 DIAGNOSIS — R112 Nausea with vomiting, unspecified: Secondary | ICD-10-CM | POA: Insufficient documentation

## 2013-10-11 DIAGNOSIS — G40909 Epilepsy, unspecified, not intractable, without status epilepticus: Secondary | ICD-10-CM | POA: Insufficient documentation

## 2013-10-11 DIAGNOSIS — F3189 Other bipolar disorder: Secondary | ICD-10-CM | POA: Insufficient documentation

## 2013-10-11 DIAGNOSIS — Z9089 Acquired absence of other organs: Secondary | ICD-10-CM | POA: Insufficient documentation

## 2013-10-11 DIAGNOSIS — Z862 Personal history of diseases of the blood and blood-forming organs and certain disorders involving the immune mechanism: Secondary | ICD-10-CM | POA: Insufficient documentation

## 2013-10-11 DIAGNOSIS — Z79899 Other long term (current) drug therapy: Secondary | ICD-10-CM | POA: Insufficient documentation

## 2013-10-11 DIAGNOSIS — F17200 Nicotine dependence, unspecified, uncomplicated: Secondary | ICD-10-CM | POA: Insufficient documentation

## 2013-10-11 DIAGNOSIS — R109 Unspecified abdominal pain: Secondary | ICD-10-CM

## 2013-10-11 DIAGNOSIS — G8929 Other chronic pain: Secondary | ICD-10-CM | POA: Insufficient documentation

## 2013-10-11 DIAGNOSIS — Z9889 Other specified postprocedural states: Secondary | ICD-10-CM | POA: Insufficient documentation

## 2013-10-11 DIAGNOSIS — Z792 Long term (current) use of antibiotics: Secondary | ICD-10-CM | POA: Insufficient documentation

## 2013-10-11 DIAGNOSIS — Z791 Long term (current) use of non-steroidal anti-inflammatories (NSAID): Secondary | ICD-10-CM | POA: Insufficient documentation

## 2013-10-11 DIAGNOSIS — Z87442 Personal history of urinary calculi: Secondary | ICD-10-CM | POA: Insufficient documentation

## 2013-10-11 DIAGNOSIS — M4120 Other idiopathic scoliosis, site unspecified: Secondary | ICD-10-CM | POA: Insufficient documentation

## 2013-10-11 DIAGNOSIS — Z7982 Long term (current) use of aspirin: Secondary | ICD-10-CM | POA: Insufficient documentation

## 2013-10-11 DIAGNOSIS — Z880 Allergy status to penicillin: Secondary | ICD-10-CM | POA: Insufficient documentation

## 2013-10-11 DIAGNOSIS — F329 Major depressive disorder, single episode, unspecified: Secondary | ICD-10-CM | POA: Insufficient documentation

## 2013-10-11 DIAGNOSIS — R1033 Periumbilical pain: Secondary | ICD-10-CM | POA: Insufficient documentation

## 2013-10-11 DIAGNOSIS — E1049 Type 1 diabetes mellitus with other diabetic neurological complication: Secondary | ICD-10-CM | POA: Insufficient documentation

## 2013-10-11 DIAGNOSIS — F4310 Post-traumatic stress disorder, unspecified: Secondary | ICD-10-CM | POA: Insufficient documentation

## 2013-10-11 NOTE — ED Notes (Signed)
Pt BIB EMS, reports that she has abdominal pain since last night with n/v, vomiting noted on scene by EMS at [facility], given 4mg  Zofran IM d/t unable to maintain IV access. Pt a&o x4, CBG 133, NAD noted at this time.

## 2013-10-12 ENCOUNTER — Inpatient Hospital Stay (HOSPITAL_COMMUNITY)
Admission: EM | Admit: 2013-10-12 | Discharge: 2013-10-14 | DRG: 638 | Disposition: A | Discharge status: Home / Self Care | Payer: Medicaid Other | Attending: Internal Medicine | Admitting: Internal Medicine

## 2013-10-12 ENCOUNTER — Emergency Department (HOSPITAL_COMMUNITY): Payer: Self-pay

## 2013-10-12 ENCOUNTER — Encounter (HOSPITAL_COMMUNITY): Payer: Self-pay | Admitting: Emergency Medicine

## 2013-10-12 DIAGNOSIS — F3189 Other bipolar disorder: Secondary | ICD-10-CM | POA: Diagnosis present

## 2013-10-12 DIAGNOSIS — Z79899 Other long term (current) drug therapy: Secondary | ICD-10-CM

## 2013-10-12 DIAGNOSIS — E1110 Type 2 diabetes mellitus with ketoacidosis without coma: Secondary | ICD-10-CM | POA: Diagnosis not present

## 2013-10-12 DIAGNOSIS — F17200 Nicotine dependence, unspecified, uncomplicated: Secondary | ICD-10-CM | POA: Diagnosis present

## 2013-10-12 DIAGNOSIS — G40909 Epilepsy, unspecified, not intractable, without status epilepticus: Secondary | ICD-10-CM

## 2013-10-12 DIAGNOSIS — F319 Bipolar disorder, unspecified: Secondary | ICD-10-CM | POA: Diagnosis not present

## 2013-10-12 DIAGNOSIS — Z9119 Patient's noncompliance with other medical treatment and regimen: Secondary | ICD-10-CM

## 2013-10-12 DIAGNOSIS — E1049 Type 1 diabetes mellitus with other diabetic neurological complication: Secondary | ICD-10-CM | POA: Diagnosis present

## 2013-10-12 DIAGNOSIS — K219 Gastro-esophageal reflux disease without esophagitis: Secondary | ICD-10-CM | POA: Diagnosis present

## 2013-10-12 DIAGNOSIS — K3184 Gastroparesis: Secondary | ICD-10-CM | POA: Diagnosis present

## 2013-10-12 DIAGNOSIS — Z91148 Patient's other noncompliance with medication regimen for other reason: Secondary | ICD-10-CM

## 2013-10-12 DIAGNOSIS — E860 Dehydration: Secondary | ICD-10-CM | POA: Diagnosis present

## 2013-10-12 DIAGNOSIS — Z880 Allergy status to penicillin: Secondary | ICD-10-CM

## 2013-10-12 DIAGNOSIS — E1065 Type 1 diabetes mellitus with hyperglycemia: Secondary | ICD-10-CM | POA: Diagnosis not present

## 2013-10-12 DIAGNOSIS — D72829 Elevated white blood cell count, unspecified: Secondary | ICD-10-CM

## 2013-10-12 DIAGNOSIS — IMO0002 Reserved for concepts with insufficient information to code with codable children: Secondary | ICD-10-CM | POA: Diagnosis present

## 2013-10-12 DIAGNOSIS — I10 Essential (primary) hypertension: Secondary | ICD-10-CM | POA: Diagnosis present

## 2013-10-12 DIAGNOSIS — Z91199 Patient's noncompliance with other medical treatment and regimen due to unspecified reason: Secondary | ICD-10-CM

## 2013-10-12 DIAGNOSIS — N946 Dysmenorrhea, unspecified: Secondary | ICD-10-CM

## 2013-10-12 DIAGNOSIS — N938 Other specified abnormal uterine and vaginal bleeding: Secondary | ICD-10-CM

## 2013-10-12 DIAGNOSIS — Z720 Tobacco use: Secondary | ICD-10-CM

## 2013-10-12 DIAGNOSIS — D509 Iron deficiency anemia, unspecified: Secondary | ICD-10-CM

## 2013-10-12 DIAGNOSIS — E1143 Type 2 diabetes mellitus with diabetic autonomic (poly)neuropathy: Secondary | ICD-10-CM | POA: Diagnosis present

## 2013-10-12 DIAGNOSIS — Z87442 Personal history of urinary calculi: Secondary | ICD-10-CM

## 2013-10-12 DIAGNOSIS — R109 Unspecified abdominal pain: Secondary | ICD-10-CM | POA: Diagnosis not present

## 2013-10-12 DIAGNOSIS — Z9114 Patient's other noncompliance with medication regimen: Secondary | ICD-10-CM

## 2013-10-12 DIAGNOSIS — R011 Cardiac murmur, unspecified: Secondary | ICD-10-CM | POA: Diagnosis present

## 2013-10-12 DIAGNOSIS — F4310 Post-traumatic stress disorder, unspecified: Secondary | ICD-10-CM | POA: Diagnosis present

## 2013-10-12 DIAGNOSIS — M4120 Other idiopathic scoliosis, site unspecified: Secondary | ICD-10-CM | POA: Diagnosis present

## 2013-10-12 DIAGNOSIS — R000 Tachycardia, unspecified: Secondary | ICD-10-CM | POA: Diagnosis present

## 2013-10-12 DIAGNOSIS — Z794 Long term (current) use of insulin: Secondary | ICD-10-CM

## 2013-10-12 DIAGNOSIS — E1010 Type 1 diabetes mellitus with ketoacidosis without coma: Principal | ICD-10-CM | POA: Diagnosis present

## 2013-10-12 DIAGNOSIS — F1210 Cannabis abuse, uncomplicated: Secondary | ICD-10-CM | POA: Diagnosis present

## 2013-10-12 DIAGNOSIS — B182 Chronic viral hepatitis C: Secondary | ICD-10-CM

## 2013-10-12 DIAGNOSIS — E1142 Type 2 diabetes mellitus with diabetic polyneuropathy: Secondary | ICD-10-CM | POA: Diagnosis present

## 2013-10-12 DIAGNOSIS — D259 Leiomyoma of uterus, unspecified: Secondary | ICD-10-CM

## 2013-10-12 DIAGNOSIS — B1920 Unspecified viral hepatitis C without hepatic coma: Secondary | ICD-10-CM | POA: Diagnosis present

## 2013-10-12 DIAGNOSIS — E876 Hypokalemia: Secondary | ICD-10-CM

## 2013-10-12 DIAGNOSIS — E139 Other specified diabetes mellitus without complications: Secondary | ICD-10-CM

## 2013-10-12 DIAGNOSIS — R112 Nausea with vomiting, unspecified: Secondary | ICD-10-CM | POA: Diagnosis present

## 2013-10-12 DIAGNOSIS — K8020 Calculus of gallbladder without cholecystitis without obstruction: Secondary | ICD-10-CM

## 2013-10-12 DIAGNOSIS — F3181 Bipolar II disorder: Secondary | ICD-10-CM

## 2013-10-12 DIAGNOSIS — G8929 Other chronic pain: Secondary | ICD-10-CM | POA: Diagnosis present

## 2013-10-12 HISTORY — DX: Essential (primary) hypertension: I10

## 2013-10-12 LAB — LIPASE, BLOOD: Lipase: 13 U/L (ref 11–59)

## 2013-10-12 LAB — CBC WITH DIFFERENTIAL/PLATELET
Basophils Absolute: 0 10*3/uL (ref 0.0–0.1)
Basophils Relative: 0 % (ref 0–1)
EOS ABS: 0 10*3/uL (ref 0.0–0.7)
Eosinophils Relative: 0 % (ref 0–5)
HCT: 44.9 % (ref 36.0–46.0)
Hemoglobin: 14.7 g/dL (ref 12.0–15.0)
LYMPHS PCT: 21 % (ref 12–46)
Lymphs Abs: 4.2 10*3/uL — ABNORMAL HIGH (ref 0.7–4.0)
MCH: 23.2 pg — ABNORMAL LOW (ref 26.0–34.0)
MCHC: 32.7 g/dL (ref 30.0–36.0)
MCV: 70.9 fL — ABNORMAL LOW (ref 78.0–100.0)
MONOS PCT: 6 % (ref 3–12)
Monocytes Absolute: 1.2 10*3/uL — ABNORMAL HIGH (ref 0.1–1.0)
NEUTROS PCT: 73 % (ref 43–77)
Neutro Abs: 14.8 10*3/uL — ABNORMAL HIGH (ref 1.7–7.7)
Platelets: 229 10*3/uL (ref 150–400)
RBC: 6.33 MIL/uL — AB (ref 3.87–5.11)
RDW: 14.6 % (ref 11.5–15.5)
WBC: 20.2 10*3/uL — AB (ref 4.0–10.5)

## 2013-10-12 LAB — COMPREHENSIVE METABOLIC PANEL
ALK PHOS: 124 U/L — AB (ref 39–117)
ALT: 55 U/L — AB (ref 0–35)
AST: 43 U/L — ABNORMAL HIGH (ref 0–37)
Albumin: 4.4 g/dL (ref 3.5–5.2)
BUN: 32 mg/dL — ABNORMAL HIGH (ref 6–23)
CO2: 18 meq/L — AB (ref 19–32)
Calcium: 10.8 mg/dL — ABNORMAL HIGH (ref 8.4–10.5)
Chloride: 94 mEq/L — ABNORMAL LOW (ref 96–112)
Creatinine, Ser: 0.7 mg/dL (ref 0.50–1.10)
GFR calc Af Amer: >90 mL/min (ref >90)
GFR calc non Af Amer: >90 mL/min (ref >90)
Glucose, Bld: 499 mg/dL — ABNORMAL HIGH (ref 70–99)
POTASSIUM: 5.1 meq/L (ref 3.7–5.3)
SODIUM: 138 meq/L (ref 137–147)
TOTAL PROTEIN: 9.2 g/dL — AB (ref 6.0–8.3)
Total Bilirubin: 0.4 mg/dL (ref 0.3–1.2)

## 2013-10-12 LAB — URINALYSIS, ROUTINE W REFLEX MICROSCOPIC
BILIRUBIN URINE: NEGATIVE
Glucose, UA: >1000 mg/dL — AB
HGB URINE DIPSTICK: NEGATIVE
Ketones, ur: 40 mg/dL — AB
Leukocytes, UA: NEGATIVE
Nitrite: NEGATIVE
Protein, ur: NEGATIVE mg/dL
SPECIFIC GRAVITY, URINE: 1.031 — AB (ref 1.005–1.030)
UROBILINOGEN UA: 0.2 mg/dL (ref 0.0–1.0)
pH: 5.5 (ref 5.0–8.0)

## 2013-10-12 LAB — CBG MONITORING, ED: Glucose-Capillary: 442 mg/dL — ABNORMAL HIGH (ref 70–99)

## 2013-10-12 LAB — URINE MICROSCOPIC-ADD ON

## 2013-10-12 MED ORDER — HYDROMORPHONE HCL PF 1 MG/ML IJ SOLN
1.0000 mg | Freq: Once | INTRAMUSCULAR | Status: AC
  Administered 2013-10-12: 1 mg via INTRAMUSCULAR
  Filled 2013-10-12: qty 1

## 2013-10-12 MED ORDER — MORPHINE SULFATE 2 MG/ML IJ SOLN
1.0000 mg | INTRAMUSCULAR | Status: DC | PRN
  Administered 2013-10-13 (×3): 2 mg via INTRAVENOUS
  Filled 2013-10-12 (×3): qty 1

## 2013-10-12 MED ORDER — PROMETHAZINE HCL 25 MG/ML IJ SOLN
25.0000 mg | Freq: Once | INTRAMUSCULAR | Status: AC
  Administered 2013-10-12: 25 mg via INTRAMUSCULAR
  Filled 2013-10-12: qty 1

## 2013-10-12 MED ORDER — NICOTINE 14 MG/24HR TD PT24
14.0000 mg | MEDICATED_PATCH | Freq: Every day | TRANSDERMAL | Status: DC
  Administered 2013-10-13 – 2013-10-14 (×2): 14 mg via TRANSDERMAL
  Filled 2013-10-12 (×2): qty 1

## 2013-10-12 MED ORDER — METOCLOPRAMIDE HCL 10 MG PO TABS
10.0000 mg | ORAL_TABLET | Freq: Three times a day (TID) | ORAL | Status: DC
  Administered 2013-10-13 – 2013-10-14 (×6): 10 mg via ORAL
  Filled 2013-10-12 (×9): qty 1

## 2013-10-12 MED ORDER — SODIUM CHLORIDE 0.9 % IV SOLN
INTRAVENOUS | Status: DC
  Administered 2013-10-13: via INTRAVENOUS

## 2013-10-12 MED ORDER — HYDROMORPHONE HCL PF 1 MG/ML IJ SOLN: 1.0000 mg | Freq: Once | INTRAMUSCULAR | Status: DC

## 2013-10-12 MED ORDER — HYDRALAZINE HCL 20 MG/ML IJ SOLN
10.0000 mg | Freq: Four times a day (QID) | INTRAMUSCULAR | Status: DC | PRN
  Filled 2013-10-12: qty 0.5

## 2013-10-12 MED ORDER — HYOSCYAMINE SULFATE 0.125 MG PO TABS
0.1250 mg | ORAL_TABLET | Freq: Three times a day (TID) | ORAL | Status: DC
  Administered 2013-10-13: 0.125 mg via ORAL
  Filled 2013-10-12 (×5): qty 1

## 2013-10-12 MED ORDER — SODIUM CHLORIDE 0.9 % IV BOLUS (SEPSIS)
1000.0000 mL | Freq: Once | INTRAVENOUS | Status: AC
  Administered 2013-10-12: 1000 mL via INTRAVENOUS

## 2013-10-12 MED ORDER — HEPARIN SODIUM (PORCINE) 5000 UNIT/ML IJ SOLN
5000.0000 [IU] | Freq: Three times a day (TID) | INTRAMUSCULAR | Status: DC
  Administered 2013-10-13 – 2013-10-14 (×5): 5000 [IU] via SUBCUTANEOUS
  Filled 2013-10-12 (×7): qty 1

## 2013-10-12 MED ORDER — DEXTROSE-NACL 5-0.45 % IV SOLN: INTRAVENOUS | Status: DC

## 2013-10-12 MED ORDER — SODIUM CHLORIDE 0.9 % IV SOLN
INTRAVENOUS | Status: DC
  Filled 2013-10-12: qty 1

## 2013-10-12 MED ORDER — DEXTROSE 50 % IV SOLN: 25.0000 mL | INTRAVENOUS | Status: DC | PRN

## 2013-10-12 MED ORDER — GABAPENTIN 300 MG PO CAPS
600.0000 mg | ORAL_CAPSULE | Freq: Three times a day (TID) | ORAL | Status: DC
  Administered 2013-10-13 – 2013-10-14 (×5): 600 mg via ORAL
  Filled 2013-10-12 (×7): qty 2

## 2013-10-12 MED ORDER — SODIUM CHLORIDE 0.9 % IV SOLN: INTRAVENOUS | Status: AC

## 2013-10-12 MED ORDER — PANTOPRAZOLE SODIUM 40 MG PO TBEC
40.0000 mg | DELAYED_RELEASE_TABLET | Freq: Two times a day (BID) | ORAL | Status: DC
  Administered 2013-10-13 – 2013-10-14 (×4): 40 mg via ORAL
  Filled 2013-10-12 (×5): qty 1

## 2013-10-12 MED ORDER — RISPERIDONE 2 MG PO TABS
2.0000 mg | ORAL_TABLET | Freq: Every day | ORAL | Status: DC
  Administered 2013-10-13 (×2): 2 mg via ORAL
  Filled 2013-10-12 (×3): qty 1

## 2013-10-12 MED ORDER — SODIUM CHLORIDE 0.9 % IV SOLN
INTRAVENOUS | Status: DC
  Administered 2013-10-12: 3.8 [IU]/h via INTRAVENOUS
  Filled 2013-10-12: qty 1

## 2013-10-12 MED ORDER — PROMETHAZINE HCL 25 MG/ML IJ SOLN: 25.0000 mg | Freq: Once | INTRAMUSCULAR | Status: DC

## 2013-10-12 MED ORDER — DEXTROSE-NACL 5-0.45 % IV SOLN
INTRAVENOUS | Status: DC
  Administered 2013-10-13: 03:00:00 via INTRAVENOUS

## 2013-10-12 NOTE — ED Notes (Signed)
Pt at X-Ray

## 2013-10-12 NOTE — ED Provider Notes (Signed)
CSN: [id number]     Arrival date & time 10/11/13  2151 History   First MD Initiated Contact with Patient 10/12/13 0025     Chief Complaint  Patient presents with  . Abdominal Pain     (Consider location/radiation/quality/duration/timing/severity/associated sxs/prior Treatment) HPI Comments: 43 year old female with a history of chronic abdominal pain with multiple visits to the emergency department for the same complaint as well as 4 diabetic control problems with diabetic ketoacidosis. She presents to the hospital with abdominal pain which started last night, associated nausea and vomiting. Paramedics on the scene noticed that the patient did have some vomiting when they picked her up from the [facility]. She was given a small amount of Zofran intramuscular which she states did not help. On arrival the patient was in no acute distress and on my arrival in the room the patient was sleeping. When I talked to the patient she immediately becomes upset and x-ray she is vomiting and grabbing her stomach. When I leave the room she goes back to sleeping. She states this is similar to the pain that she has chronically. She has already had cholecystectomy  Patient is a 43 y.o. female presenting with abdominal pain. The history is provided by the patient and medical records.  Abdominal Pain   Past Medical History  Diagnosis Date  . Kidney stone   . Seizure disorder     started with pregnancy of first son  . Gastritis   . Bipolar 2 disorder   . Post traumatic stress disorder     "flipping out" after people close to her died  . Heart murmur   . Arthritis     r ankle-S/P surgery (2007)  . Anemia     childhood  . Depression     childhood  . Hepatitis C     biopsy in 2010-no rx-was supposed to see a hepatologist in [place].  With cirrhosis  . Neuropathy     in legs and feet and hands  . Cataracts, bilateral   . Scoliosis   . Diabetes mellitus     diagnosed in 1996-always been on  insulin  . DKA (diabetic ketoacidoses)     Recurrent admissions for DKA, medication non-complaince, poor social situation  . Diabetic neuropathy, type I diabetes mellitus     numbness bilaterally feet   Past Surgical History  Procedure Laterality Date  . Ankle surgery  2007  . Endometrial biopsy  06/22/2012  . Cholecystectomy  04/07/2013  . Cholecystectomy N/A 04/07/2013    Procedure: LAPAROSCOPIC CHOLECYSTECTOMY WITH INTRAOPERATIVE CHOLANGIOGRAM;  Surgeon: Adin Hector, MD;  Location: [facility];  Service: General;  Laterality: N/A;  . Cesarean section      3 times   Family History  Problem Relation Age of Onset  . Diabetes Mother     currently 60  . Fibromyalgia Mother   . Cirrhosis Father     died in 3  . Diabetes Maternal Grandmother   . Diabetes Maternal Aunt    History  Substance Use Topics  . Smoking status: Current Every Day Smoker -- 0.10 packs/day for 25 years    Types: Cigarettes  . Smokeless tobacco: Never Used  . Alcohol Use: No     Comment: Endorses hasn't been drinking   OB History   Grav Para Term Preterm Abortions TAB SAB Ect Mult Living   4 3 3  0 1 0 1 0 1 2     Review of Systems  Gastrointestinal: Positive for abdominal  pain.  All other systems reviewed and are negative.     Allergies  Penicillins  Home Medications   Prior to Admission medications   Medication Sig Start Date End Date Taking? Authorizing Provider  aspirin EC 81 MG tablet Take 81 mg by mouth daily.     Historical Provider, MD  calcium carbonate (TUMS - DOSED IN MG ELEMENTAL CALCIUM) 500 MG chewable tablet Chew 2 tablets by mouth daily as needed. For heartburn    Historical Provider, MD  dicyclomine (BENTYL) 20 MG tablet Take 1 tablet (20 mg total) by mouth every 6 (six) hours as needed for spasms (for abdominal cramping). 05/15/13   Domenic Moras, PA-C  diphenhydrAMINE (BENADRYL) 25 MG tablet Take with reglan as needed 04/21/13   Kalman Drape, MD  gabapentin (NEURONTIN) 400 MG  capsule Take 600 mg by mouth 3 (three) times daily.     Historical Provider, MD  hydrochlorothiazide (MICROZIDE) 12.5 MG capsule Take 12.5 mg by mouth daily.    Historical Provider, MD  insulin glargine (LANTUS) 100 UNIT/ML injection Inject 50 Units into the skin at bedtime.    Historical Provider, MD  insulin NPH-regular Human (NOVOLIN 70/30) (70-30) 100 UNIT/ML injection Inject 35 Units into the skin 2 (two) times daily with a meal.    Historical Provider, MD  insulin NPH-regular Human (NOVOLIN 70/30) (70-30) 100 UNIT/ML injection Inject 35 Units into the skin daily with breakfast. 10/01/13   Marissa Sciacca, PA-C  medroxyPROGESTERone (PROVERA) 10 MG tablet Take 2 tablets (20 mg total) by mouth daily. 04/15/13   Woodroe Mode, MD  meloxicam (MOBIC) 15 MG tablet Take 15 mg by mouth daily.     Historical Provider, MD  metFORMIN (GLUCOPHAGE) 1000 MG tablet Take 1,000 mg by mouth 2 (two) times daily.    Historical Provider, MD  metoCLOPramide (REGLAN) 10 MG tablet Take 1 tablet (10 mg total) by mouth 4 (four) times daily -  before meals and at bedtime. 04/21/13   Kalman Drape, MD  metoCLOPramide (REGLAN) 10 MG tablet Take 1 tablet (10 mg total) by mouth every 6 (six) hours. 10/01/13   Marissa Sciacca, PA-C  Multiple Vitamin (MULTIVITAMIN WITH MINERALS) TABS tablet Take 1 tablet by mouth daily.    Historical Provider, MD  ondansetron (ZOFRAN) 4 MG tablet Take 1 tablet (4 mg total) by mouth every 6 (six) hours. 10/01/13   Marissa Sciacca, PA-C  ranitidine (ZANTAC) 150 MG tablet Take 150 mg by mouth 2 (two) times daily.    Historical Provider, MD  risperidone (RISPERDAL) 4 MG tablet Take 2 mg by mouth at bedtime.     Historical Provider, MD  sitaGLIPtin (JANUVIA) 100 MG tablet Take 100 mg by mouth daily.    Historical Provider, MD  sulfamethoxazole-trimethoprim (BACTRIM DS) 800-160 MG per tablet Take 1 tablet by mouth 2 (two) times daily. 05/10/13   Historical Provider, MD   BP 149/73  Pulse 73  Temp(Src)  98.2 F (36.8 C) (Oral)  Resp 22  SpO2 100% Physical Exam  Nursing note and vitals reviewed. Constitutional: She appears well-developed and well-nourished. No distress.  HENT:  Head: Normocephalic and atraumatic.  Mouth/Throat: Oropharynx is clear and moist. No oropharyngeal exudate.  Eyes: Conjunctivae and EOM are normal. Pupils are equal, round, and reactive to light. Right eye exhibits no discharge. Left eye exhibits no discharge. No scleral icterus.  Neck: Normal range of motion. Neck supple. No JVD present. No thyromegaly present.  Cardiovascular: Normal rate, regular rhythm, normal heart sounds and  intact distal pulses.  Exam reveals no gallop and no friction rub.   No murmur heard. Pulmonary/Chest: Effort normal and breath sounds normal. No respiratory distress. She has no wheezes. She has no rales.  Abdominal: Soft. Bowel sounds are normal. She exhibits no distension and no mass. There is tenderness ( No guarding, no rebound, soft abdomen with mild periumbilical tenderness).  No distention, no tympanitic sounds to percussion  Musculoskeletal: Normal range of motion. She exhibits no edema and no tenderness.  Lymphadenopathy:    She has no cervical adenopathy.  Neurological: She is alert. Coordination normal.  Skin: Skin is warm and dry. No rash noted. No erythema.  Psychiatric: She has a normal mood and affect. Her behavior is normal.    ED Course  Procedures (including critical care time) Labs Review Labs Reviewed - No data to display  Imaging Review No results found.    MDM   Final diagnoses:  Chronic abdominal pain    Question the etiology of the patient's pain is real or not given her frequent visits and seemingly dramatic appearance when the provider is in the room. Pain medication and nausea medication ordered. The patient does not have a surgical abdomen at this time  meds given, VS normal, reasonable for d/c.  Meds given in ED:  Medications   HYDROmorphone (DILAUDID) injection 1 mg (1 mg Intramuscular Given 10/12/13 0218)  promethazine (PHENERGAN) injection 25 mg (25 mg Intramuscular Given 10/12/13 0218)    New Prescriptions   No medications on file      Johnna Acosta, MD 10/12/13 [phone]

## 2013-10-12 NOTE — ED Notes (Signed)
Pt crying out in pain. Pt reports recurrent abdominal pain, nausea, vomiting. Pain in upper abdominal and radiates across abdomin

## 2013-10-12 NOTE — ED Notes (Signed)
Bed: [facility] Expected date:  Expected time:  Means of arrival:  Comments: 43 y/o abd pain, was seen earlier today

## 2013-10-12 NOTE — ED Notes (Signed)
1 unsuccessful IV attempt.

## 2013-10-12 NOTE — H&P (Addendum)
[facility] History and Physical  [name] RFX:[id number] DOB: November 18, 1970 DOA: 10/12/2013  Referring physician: Dr. Karma Ganja PCP: Dartha Lodge, FNP   Chief Complaint: Abdominal pain, nausea/vomiting, dehydration and DKA  HPI: Mary [name] is a 43 y.o. female with past medical history significant for uncontrolled type 1 diabetes mellitus (with multiple admissions secondary to DKA), medication noncompliance, hypertension, bipolar disorder gastroparesis and neuropathy; came to the hospital complaining of severe abdominal pain, nausea and vomiting. Patient had history of chronic abdominal pain and requiring multiples ED visits in the past. This time patient reports that she has been having trouble keeping anything down including her medications. Patient also endorses missing doses of her insulin as she was not able to eat. Patient reports some chest pain after multiple episodes of vomiting.  Denies shortness of breath, cough, fever, chills, dysuria, hematemesis, melena, hematochezia, headaches, blurred vision or any other acute complaints. In the ED patient was found to be in DKA with an anion gap of 26, sugar of 499 and bicarbonate 18. Patient also had leukocytosis of 20.8, hypocalcemia (10.8) and findings of her urine of > 1000 glucose and elevated specific gravity (more than 1.030). [facility] hospitalist has been called to admit the patient for further evaluation and treatment.  Of note, in the ED abdominal and chest x-ray were done ruling out any acute abdominal/cardiopulmonary process.  Review of Systems:  Negative except as otherwise mentioned on history of present illness.  Past Medical History  Diagnosis Date  . Kidney stone   . Seizure disorder     started with pregnancy of first son  . Gastritis   . Bipolar 2 disorder   . Post traumatic stress disorder     "flipping out" after people close to her died  . Heart murmur   . Arthritis     r ankle-S/P surgery (2007)  . Anemia      childhood  . Depression     childhood  . Hepatitis C     biopsy in 2010-no rx-was supposed to see a hepatologist in [place].  With cirrhosis  . Neuropathy     in legs and feet and hands  . Cataracts, bilateral   . Scoliosis   . Diabetes mellitus     diagnosed in 1996-always been on insulin  . DKA (diabetic ketoacidoses)     Recurrent admissions for DKA, medication non-complaince, poor social situation  . Diabetic neuropathy, type I diabetes mellitus     numbness bilaterally feet  . HTN (hypertension) 10/12/2013   Past Surgical History  Procedure Laterality Date  . Ankle surgery  2007  . Endometrial biopsy  06/22/2012  . Cholecystectomy  04/07/2013  . Cholecystectomy N/A 04/07/2013    Procedure: LAPAROSCOPIC CHOLECYSTECTOMY WITH INTRAOPERATIVE CHOLANGIOGRAM;  Surgeon: Ernestene Mention, MD;  Location: [facility];  Service: General;  Laterality: N/A;  . Cesarean section      3 times   Social History:  reports that she has been smoking Cigarettes.  She has a 2.5 pack-year smoking history. She has never used smokeless tobacco. She reports that she uses illicit drugs (Marijuana) about once per week. She reports that she does not drink alcohol.  Allergies  Allergen Reactions  . Penicillins Rash    Family History  Problem Relation Age of Onset  . Diabetes Mother     currently 27  . Fibromyalgia Mother   . Cirrhosis Father     died in 18  . Diabetes Maternal Grandmother   . Diabetes Maternal  Aunt      Prior to Admission medications   Medication Sig Start Date End Date Taking? Authorizing Provider  aspirin EC 81 MG tablet Take 81 mg by mouth daily.     Historical Provider, MD  calcium carbonate (TUMS - DOSED IN MG ELEMENTAL CALCIUM) 500 MG chewable tablet Chew 2 tablets by mouth daily as needed. For heartburn    Historical Provider, MD  diphenhydrAMINE (BENADRYL) 25 MG tablet Take with reglan as needed 04/21/13   Kalman Drape, MD  gabapentin (NEURONTIN) 400 MG capsule  Take 600 mg by mouth 3 (three) times daily.     Historical Provider, MD  hydrochlorothiazide (MICROZIDE) 12.5 MG capsule Take 12.5 mg by mouth daily.    Historical Provider, MD  insulin glargine (LANTUS) 100 UNIT/ML injection Inject 50 Units into the skin at bedtime.    Historical Provider, MD  insulin NPH-regular Human (NOVOLIN 70/30) (70-30) 100 UNIT/ML injection Inject 35 Units into the skin 2 (two) times daily with a meal.    Historical Provider, MD  insulin NPH-regular Human (NOVOLIN 70/30) (70-30) 100 UNIT/ML injection Inject 35 Units into the skin daily with breakfast. 10/01/13   Marissa Sciacca, PA-C  medroxyPROGESTERone (PROVERA) 10 MG tablet Take 2 tablets (20 mg total) by mouth daily. 04/15/13   Woodroe Mode, MD  meloxicam (MOBIC) 15 MG tablet Take 15 mg by mouth daily.     Historical Provider, MD  metFORMIN (GLUCOPHAGE) 1000 MG tablet Take 1,000 mg by mouth 2 (two) times daily.    Historical Provider, MD  metoCLOPramide (REGLAN) 10 MG tablet Take 1 tablet (10 mg total) by mouth 4 (four) times daily -  before meals and at bedtime. 04/21/13   Kalman Drape, MD  metoCLOPramide (REGLAN) 10 MG tablet Take 1 tablet (10 mg total) by mouth every 6 (six) hours. 10/01/13   Marissa Sciacca, PA-C  Multiple Vitamin (MULTIVITAMIN WITH MINERALS) TABS tablet Take 1 tablet by mouth daily.    Historical Provider, MD  ondansetron (ZOFRAN) 4 MG tablet Take 1 tablet (4 mg total) by mouth every 6 (six) hours. 10/01/13   Marissa Sciacca, PA-C  ranitidine (ZANTAC) 150 MG tablet Take 150 mg by mouth 2 (two) times daily.    Historical Provider, MD  risperidone (RISPERDAL) 4 MG tablet Take 2 mg by mouth at bedtime.     Historical Provider, MD  sitaGLIPtin (JANUVIA) 100 MG tablet Take 100 mg by mouth daily.    Historical Provider, MD  sulfamethoxazole-trimethoprim (BACTRIM DS) 800-160 MG per tablet Take 1 tablet by mouth 2 (two) times daily. 05/10/13   Historical Provider, MD   Physical Exam: Filed Vitals:    10/12/13 2251  BP: 153/93  Pulse: 106  Temp: 98.4 F (36.9 C)  Resp: 20    BP 153/93  Pulse 106  Temp(Src) 98.4 F (36.9 C) (Oral)  Resp 20  SpO2 99%  General:  Cooperative to examination, initially was waken from sleep and throughout exam start having crying spells and complaining of severe abd pain. Eyes: PERRL, normal lids, irises & conjunctiva, no icterus, no nystagmus, EOMI ENT: grossly normal hearing, fair dentition, no erythema or exudates inside her mouth, dry MM. No drainage out of ears or nostrils Neck: no LAD, masses or thyromegaly, no JVD Cardiovascular: tachycardic, no m/r/g. No LE edema. Respiratory: CTA bilaterally, no w/r/r. Normal respiratory effort. Abdomen: soft, diffuse tenderness to palpation, no guarding, positive BS, no distension  Skin: no rash or induration seen on limited exam Musculoskeletal: grossly  normal tone BUE/BLE Psychiatric: grossly normal mood and affect, speech fluent and appropriate Neurologic: grossly non-focal.          Labs on Admission:  Basic Metabolic Panel:  Recent Labs Lab 10/12/13 1810  NA 138  K 5.1  CL 94*  CO2 18*  GLUCOSE 499*  BUN 32*  CREATININE 0.70  CALCIUM 10.8*   Liver Function Tests:  Recent Labs Lab 10/12/13 1810  AST 43*  ALT 55*  ALKPHOS 124*  BILITOT 0.4  PROT 9.2*  ALBUMIN 4.4    Recent Labs Lab 10/12/13 1810  LIPASE 13   CBC:  Recent Labs Lab 10/12/13 1810  WBC 20.2*  NEUTROABS 14.8*  HGB 14.7  HCT 44.9  MCV 70.9*  PLT 229   CBG:  Recent Labs Lab 10/12/13 2313  GLUCAP 442*    Radiological Exams on Admission: Dg Abd Acute W/chest  10/12/2013   CLINICAL DATA:  Abdominal pain and vomiting.  EXAM: ACUTE ABDOMEN SERIES (ABDOMEN 2 VIEW & CHEST 1 VIEW)  COMPARISON:  Chest and abdominal radiographs performed 10/01/2013  FINDINGS: The lungs are well-aerated and clear. There is no evidence of focal opacification, pleural effusion or pneumothorax. The cardiomediastinal silhouette  is within normal limits.  The visualized bowel gas pattern is unremarkable. Scattered stool and air are seen within the colon; there is no evidence of small bowel dilatation to suggest obstruction. No free intra-abdominal air is identified on the provided upright view. Clips are noted within the right upper quadrant, reflecting prior cholecystectomy.  No acute osseous abnormalities are seen; the sacroiliac joints are unremarkable in appearance.  IMPRESSION: 1. Unremarkable bowel gas pattern; no free intra-abdominal air seen. 2. No acute cardiopulmonary process seen.   Electronically Signed   By: Garald Balding M.D.   On: 10/12/2013 22:38    EKG:  Ordered and pending; will follow. Just sinus tachycardia appreciated on ED monitor.   Assessment/Plan 1-DKA (diabetic ketoacidoses): most likely secondary to medication non-compliance as patient had hx of this. Patient also complaining of CP (which started after multiple episodes of vomiting) but given risk factors will r/o ACS as well. -admit to telemetry -cycle CE's and check EKG -start IV insulin drip with DKA protocol -NPO status (except for ice chips and sips with certain meds) -PRN antiemetics and analgesics -started on PPI BID -will check UDS and ethanol level   2-Bipolar disorder: will continue risperdal. No SI or hallucinations currently  3-Abdominal pain: chronic in nature. Most likely due to gastroparesis. -will continue reglan -PRN pain meds -start PPI and levsin  4-DM (diabetes mellitus), type 1, uncontrolled: will check A1C. -will have diabetes coordinator reinforced education about disease and help with adjustments of outpatient regimen  5-Nausea with vomiting: due to gastroparesis and DKA -will use PRN antiemetics -continue reglan  6-Gastroparesis due to DM: continue reglan, started on PPI  -patient advised about importance of good control of her CBG's and medication compliance  7-GERD (gastroesophageal reflux  disease):staretd on PPI  8-HTN (hypertension): due to dehydration and NPO status will hold HCTZ -started on hydralazine PRN  9-hypercalcemia: mild. Due to dehydration and use of HCTZ -will provide IVF's and follow electrolytes trend   10-neuropathy: Will continue Neurontin.  11-transaminitis: Chronic and most likely secondary to her history of hepatitis C.  12-hepatitis C : Chronic. At this time stable/at baseline for her. Patient supposed to follow with hepatic clinic in [facility].     DVT: heparin   Code Status: Full Family Communication: no family at bedside  Disposition Plan: LOS > 2 midnights, inpatient, telemetry   Time spent: 50 minutes  [facility] Pager [phone]

## 2013-10-12 NOTE — ED Notes (Signed)
Per EMS-#61, pt called EMS for co recurrent abdominal pain, nausea, vomiting. Seen here this  Am. Pt alert, oriented and appropriate

## 2013-10-12 NOTE — ED Provider Notes (Signed)
CSN: [id number]     Arrival date & time 10/12/13  1741 History   First MD Initiated Contact with Patient 10/12/13 2018     Chief Complaint  Patient presents with  . Abdominal Pain  . Nausea  . Emesis    pt reports 4 episodes of vomiting today     (Consider location/radiation/quality/duration/timing/severity/associated sxs/prior Treatment) HPI Pt presents with c/o diffuse abdominal pain as well as nausea and vomiting.  She was seen in the ED this morning, states she did not feel better after medication and has been having pain and vomiting all day.  Emesis is nonbloody and nonbilious.  No diarrhea.  No fever/chills. She states she hasnt' had any medications at home to help with her symptoms.  Pt is screaming and crying and not able to answer questions in much detail.  Pain is constant and sharp.    Past Medical History  Diagnosis Date  . Kidney stone   . Seizure disorder     started with pregnancy of first son  . Gastritis   . Bipolar 2 disorder   . Post traumatic stress disorder     "flipping out" after people close to her died  . Heart murmur   . Arthritis     r ankle-S/P surgery (2007)  . Anemia     childhood  . Depression     childhood  . Hepatitis C     biopsy in 2010-no rx-was supposed to see a hepatologist in [place].  With cirrhosis  . Neuropathy     in legs and feet and hands  . Cataracts, bilateral   . Scoliosis   . Diabetes mellitus     diagnosed in 1996-always been on insulin  . DKA (diabetic ketoacidoses)     Recurrent admissions for DKA, medication non-complaince, poor social situation  . Diabetic neuropathy, type I diabetes mellitus     numbness bilaterally feet  . HTN (hypertension) 10/12/2013   Past Surgical History  Procedure Laterality Date  . Ankle surgery  2007  . Endometrial biopsy  06/22/2012  . Cholecystectomy  04/07/2013  . Cholecystectomy N/A 04/07/2013    Procedure: LAPAROSCOPIC CHOLECYSTECTOMY WITH INTRAOPERATIVE CHOLANGIOGRAM;   Surgeon: Adin Hector, MD;  Location: [facility];  Service: General;  Laterality: N/A;  . Cesarean section      3 times   Family History  Problem Relation Age of Onset  . Diabetes Mother     currently 83  . Fibromyalgia Mother   . Cirrhosis Father     died in 62  . Diabetes Maternal Grandmother   . Diabetes Maternal Aunt    History  Substance Use Topics  . Smoking status: Current Every Day Smoker -- 0.10 packs/day for 25 years    Types: Cigarettes  . Smokeless tobacco: Never Used  . Alcohol Use: No     Comment: Endorses hasn't been drinking   OB History   Grav Para Term Preterm Abortions TAB SAB Ect Mult Living   4 3 3  0 1 0 1 0 1 2     Review of Systems ROS reviewed and all otherwise negative except for mentioned in HPI    Allergies  Penicillins  Home Medications   Prior to Admission medications   Medication Sig Start Date End Date Taking? Authorizing Provider  aspirin EC 81 MG tablet Take 81 mg by mouth daily.     Historical Provider, MD  calcium carbonate (TUMS - DOSED IN MG ELEMENTAL CALCIUM) 500 MG chewable tablet  Chew 2 tablets by mouth daily as needed. For heartburn    Historical Provider, MD  diphenhydrAMINE (BENADRYL) 25 MG tablet Take with reglan as needed 04/21/13   Kalman Drape, MD  gabapentin (NEURONTIN) 400 MG capsule Take 600 mg by mouth 3 (three) times daily.     Historical Provider, MD  hydrochlorothiazide (MICROZIDE) 12.5 MG capsule Take 12.5 mg by mouth daily.    Historical Provider, MD  insulin glargine (LANTUS) 100 UNIT/ML injection Inject 50 Units into the skin at bedtime.    Historical Provider, MD  insulin NPH-regular Human (NOVOLIN 70/30) (70-30) 100 UNIT/ML injection Inject 35 Units into the skin 2 (two) times daily with a meal.    Historical Provider, MD  insulin NPH-regular Human (NOVOLIN 70/30) (70-30) 100 UNIT/ML injection Inject 35 Units into the skin daily with breakfast. 10/01/13   Marissa Sciacca, PA-C  medroxyPROGESTERone (PROVERA) 10  MG tablet Take 2 tablets (20 mg total) by mouth daily. 04/15/13   Woodroe Mode, MD  meloxicam (MOBIC) 15 MG tablet Take 15 mg by mouth daily.     Historical Provider, MD  metFORMIN (GLUCOPHAGE) 1000 MG tablet Take 1,000 mg by mouth 2 (two) times daily.    Historical Provider, MD  metoCLOPramide (REGLAN) 10 MG tablet Take 1 tablet (10 mg total) by mouth 4 (four) times daily -  before meals and at bedtime. 04/21/13   Kalman Drape, MD  metoCLOPramide (REGLAN) 10 MG tablet Take 1 tablet (10 mg total) by mouth every 6 (six) hours. 10/01/13   Marissa Sciacca, PA-C  Multiple Vitamin (MULTIVITAMIN WITH MINERALS) TABS tablet Take 1 tablet by mouth daily.    Historical Provider, MD  ondansetron (ZOFRAN) 4 MG tablet Take 1 tablet (4 mg total) by mouth every 6 (six) hours. 10/01/13   Marissa Sciacca, PA-C  ranitidine (ZANTAC) 150 MG tablet Take 150 mg by mouth 2 (two) times daily.    Historical Provider, MD  risperidone (RISPERDAL) 4 MG tablet Take 2 mg by mouth at bedtime.     Historical Provider, MD  sitaGLIPtin (JANUVIA) 100 MG tablet Take 100 mg by mouth daily.    Historical Provider, MD  sulfamethoxazole-trimethoprim (BACTRIM DS) 800-160 MG per tablet Take 1 tablet by mouth 2 (two) times daily. 05/10/13   Historical Provider, MD   BP 153/93  Pulse 106  Temp(Src) 98.4 F (36.9 C) (Oral)  Resp 20  SpO2 99% Vitals reviewed Physical Exam Physical Examination: General appearance - alert, chroncially ill appearing, pt crying adn screaming, throwing pillows, uncomfortable apearing, and in no distress Mental status - alert, oriented to person, place, and time Eyes - no conjunctival injection, no scleral icterus Mouth - mucous membranes moist, pharynx normal without lesions Chest - clear to auscultation, no wheezes, rales or rhonchi, symmetric air entry Heart - normal rate, regular rhythm, normal S1, S2, no murmurs, rubs, clicks or gallops Abdomen - soft, diffuse ttp, no gaurding or rebound tenderness,  nondistended, no masses or organomegaly Extremities - peripheral pulses normal, no pedal edema, no clubbing or cyanosis Skin - normal coloration and turgor, no rashes  ED Course  Procedures (including critical care time)  11:01 PM d/w [facility] for admission, dr. Dyann Kief- pt to go to telemetry, team 8  CRITICAL CARE Performed by: Threasa Beards Total critical care time: 45 Critical care time was exclusive of separately billable procedures and treating other patients. Critical care was necessary to treat or prevent imminent or life-threatening deterioration. Critical care was time spent personally by  me on the following activities: development of treatment plan with patient and/or surrogate as well as nursing, discussions with consultants, evaluation of patient's response to treatment, examination of patient, obtaining history from patient or surrogate, ordering and performing treatments and interventions, ordering and review of laboratory studies, ordering and review of radiographic studies, pulse oximetry and re-evaluation of patient's condition. Labs Review Labs Reviewed  CBC WITH DIFFERENTIAL - Abnormal; Notable for the following:    WBC 20.2 (*)    RBC 6.33 (*)    MCV 70.9 (*)    MCH 23.2 (*)    Neutro Abs 14.8 (*)    Lymphs Abs 4.2 (*)    Monocytes Absolute 1.2 (*)    All other components within normal limits  COMPREHENSIVE METABOLIC PANEL - Abnormal; Notable for the following:    Chloride 94 (*)    CO2 18 (*)    Glucose, Bld 499 (*)    BUN 32 (*)    Calcium 10.8 (*)    Total Protein 9.2 (*)    AST 43 (*)    ALT 55 (*)    Alkaline Phosphatase 124 (*)    All other components within normal limits  URINALYSIS, ROUTINE W REFLEX MICROSCOPIC - Abnormal; Notable for the following:    Specific Gravity, Urine 1.031 (*)    Glucose, UA >1000 (*)    Ketones, ur 40 (*)    All other components within normal limits  CBG MONITORING, ED - Abnormal; Notable for the following:     Glucose-Capillary 442 (*)    All other components within normal limits  LIPASE, BLOOD  URINE MICROSCOPIC-ADD ON    Imaging Review Dg Abd Acute W/chest  10/12/2013   CLINICAL DATA:  Abdominal pain and vomiting.  EXAM: ACUTE ABDOMEN SERIES (ABDOMEN 2 VIEW & CHEST 1 VIEW)  COMPARISON:  Chest and abdominal radiographs performed 10/01/2013  FINDINGS: The lungs are well-aerated and clear. There is no evidence of focal opacification, pleural effusion or pneumothorax. The cardiomediastinal silhouette is within normal limits.  The visualized bowel gas pattern is unremarkable. Scattered stool and air are seen within the colon; there is no evidence of small bowel dilatation to suggest obstruction. No free intra-abdominal air is identified on the provided upright view. Clips are noted within the right upper quadrant, reflecting prior cholecystectomy.  No acute osseous abnormalities are seen; the sacroiliac joints are unremarkable in appearance.  IMPRESSION: 1. Unremarkable bowel gas pattern; no free intra-abdominal air seen. 2. No acute cardiopulmonary process seen.   Electronically Signed   By: Garald Balding M.D.   On: 10/12/2013 22:38     EKG Interpretation None      MDM   Final diagnoses:  Abdominal pain  Bipolar disorder  DKA (diabetic ketoacidoses)  DM (diabetes mellitus), type 1, uncontrolled  Leukocytosis, unspecified  Nausea with vomiting  Tobacco abuse    Pt presenting with abdominal pain, vomiting.  She has hx of chronic abdominal pain, DM with DKA, noncompliance with meds- labs reveal patient is in DKA with AG 26, BS 499.  Pt treated with dialudid and phenergan, IV fluids and insulin drip started, d/w [facility] for admission and further evaluation and management.    Threasa Beards, MD 10/12/13 [phone]

## 2013-10-12 NOTE — ED Notes (Signed)
Patient is alert and oriented x3.  She was given DC instructions and follow up visit instructions.  Patient gave verbal understanding. She was DC ambulatory under her own power to home.  V/S stable.  He was not showing any signs of distress on DC 

## 2013-10-12 NOTE — ED Notes (Signed)
Pt. Is not able to use the restroom at this time she is in too much pain to go at this time.

## 2013-10-13 ENCOUNTER — Encounter (HOSPITAL_COMMUNITY): Payer: Self-pay | Admitting: Emergency Medicine

## 2013-10-13 DIAGNOSIS — F3189 Other bipolar disorder: Secondary | ICD-10-CM

## 2013-10-13 DIAGNOSIS — R109 Unspecified abdominal pain: Secondary | ICD-10-CM | POA: Diagnosis not present

## 2013-10-13 DIAGNOSIS — F319 Bipolar disorder, unspecified: Secondary | ICD-10-CM | POA: Diagnosis not present

## 2013-10-13 LAB — GLUCOSE, CAPILLARY
GLUCOSE-CAPILLARY: 222 mg/dL — AB (ref 70–99)
GLUCOSE-CAPILLARY: 170 mg/dL — AB (ref 70–99)
GLUCOSE-CAPILLARY: 149 mg/dL — AB (ref 70–99)
GLUCOSE-CAPILLARY: 169 mg/dL — AB (ref 70–99)
GLUCOSE-CAPILLARY: 139 mg/dL — AB (ref 70–99)
GLUCOSE-CAPILLARY: 412 mg/dL — AB (ref 70–99)
GLUCOSE-CAPILLARY: 394 mg/dL — AB (ref 70–99)
Glucose-Capillary: 303 mg/dL — ABNORMAL HIGH (ref 70–99)
Glucose-Capillary: 312 mg/dL — ABNORMAL HIGH (ref 70–99)
Glucose-Capillary: 169 mg/dL — ABNORMAL HIGH (ref 70–99)
Glucose-Capillary: 251 mg/dL — ABNORMAL HIGH (ref 70–99)
Glucose-Capillary: 162 mg/dL — ABNORMAL HIGH (ref 70–99)
Glucose-Capillary: 163 mg/dL — ABNORMAL HIGH (ref 70–99)
Glucose-Capillary: 149 mg/dL — ABNORMAL HIGH (ref 70–99)
Glucose-Capillary: 138 mg/dL — ABNORMAL HIGH (ref 70–99)
Glucose-Capillary: 137 mg/dL — ABNORMAL HIGH (ref 70–99)
Glucose-Capillary: 173 mg/dL — ABNORMAL HIGH (ref 70–99)
Glucose-Capillary: 188 mg/dL — ABNORMAL HIGH (ref 70–99)
Glucose-Capillary: 139 mg/dL — ABNORMAL HIGH (ref 70–99)

## 2013-10-13 LAB — RAPID URINE DRUG SCREEN, HOSP PERFORMED
Amphetamines: NOT DETECTED
BARBITURATES: NOT DETECTED
Benzodiazepines: NOT DETECTED
COCAINE: NOT DETECTED
Opiates: POSITIVE — AB
TETRAHYDROCANNABINOL: POSITIVE — AB

## 2013-10-13 LAB — BASIC METABOLIC PANEL
BUN: 42 mg/dL — AB (ref 6–23)
BUN: 40 mg/dL — ABNORMAL HIGH (ref 6–23)
BUN: 35 mg/dL — ABNORMAL HIGH (ref 6–23)
CHLORIDE: 102 meq/L (ref 96–112)
CHLORIDE: 107 meq/L (ref 96–112)
CO2: 22 mEq/L (ref 19–32)
CO2: 23 meq/L (ref 19–32)
CO2: 25 meq/L (ref 19–32)
CREATININE: 0.64 mg/dL (ref 0.50–1.10)
Calcium: 10.4 mg/dL (ref 8.4–10.5)
Calcium: 10.3 mg/dL (ref 8.4–10.5)
Calcium: 10.3 mg/dL (ref 8.4–10.5)
Chloride: 103 mEq/L (ref 96–112)
Creatinine, Ser: 0.7 mg/dL (ref 0.50–1.10)
Creatinine, Ser: 0.68 mg/dL (ref 0.50–1.10)
GFR calc Af Amer: >90 mL/min (ref >90)
GFR calc non Af Amer: >90 mL/min (ref >90)
GFR calc non Af Amer: >90 mL/min (ref >90)
GFR calc non Af Amer: >90 mL/min (ref >90)
GLUCOSE: 289 mg/dL — AB (ref 70–99)
GLUCOSE: 178 mg/dL — AB (ref 70–99)
GLUCOSE: 149 mg/dL — AB (ref 70–99)
POTASSIUM: 4.4 meq/L (ref 3.7–5.3)
Potassium: 4.6 mEq/L (ref 3.7–5.3)
Potassium: 5.1 mEq/L (ref 3.7–5.3)
SODIUM: 144 meq/L (ref 137–147)
Sodium: 142 mEq/L (ref 137–147)
Sodium: 145 mEq/L (ref 137–147)

## 2013-10-13 LAB — CBC
HCT: 46.4 % — ABNORMAL HIGH (ref 36.0–46.0)
HEMOGLOBIN: 14.8 g/dL (ref 12.0–15.0)
MCH: 22.9 pg — AB (ref 26.0–34.0)
MCHC: 31.9 g/dL (ref 30.0–36.0)
MCV: 71.8 fL — ABNORMAL LOW (ref 78.0–100.0)
Platelets: 219 10*3/uL (ref 150–400)
RBC: 6.46 MIL/uL — ABNORMAL HIGH (ref 3.87–5.11)
RDW: 15 % (ref 11.5–15.5)
WBC: 17.9 10*3/uL — ABNORMAL HIGH (ref 4.0–10.5)

## 2013-10-13 LAB — HEMOGLOBIN A1C
Hgb A1c MFr Bld: 13 % — ABNORMAL HIGH (ref <5.7)
Mean Plasma Glucose: 326 mg/dL — ABNORMAL HIGH (ref <117)

## 2013-10-13 LAB — TROPONIN I: Troponin I: <0.3 ng/mL (ref <0.30)

## 2013-10-13 LAB — ETHANOL: Alcohol, Ethyl (B): <11 mg/dL (ref 0–11)

## 2013-10-13 LAB — MRSA PCR SCREENING: MRSA by PCR: NEGATIVE

## 2013-10-13 MED ORDER — INSULIN GLARGINE 100 UNIT/ML ~~LOC~~ SOLN
10.0000 [IU] | Freq: Every day | SUBCUTANEOUS | Status: DC
  Administered 2013-10-13: 10 [IU] via SUBCUTANEOUS
  Filled 2013-10-13 (×4): qty 0.1

## 2013-10-13 MED ORDER — INSULIN ASPART 100 UNIT/ML ~~LOC~~ SOLN
0.0000 [IU] | Freq: Every day | SUBCUTANEOUS | Status: DC
Start: 2013-10-13 — End: 2013-10-14

## 2013-10-13 MED ORDER — SODIUM CHLORIDE 0.9 % IV SOLN
INTRAVENOUS | Status: DC
  Administered 2013-10-13 (×2): via INTRAVENOUS
  Administered 2013-10-14: 1 mL via INTRAVENOUS

## 2013-10-13 MED ORDER — INSULIN ASPART 100 UNIT/ML ~~LOC~~ SOLN
4.0000 [IU] | Freq: Three times a day (TID) | SUBCUTANEOUS | Status: DC
  Administered 2013-10-14 (×2): 4 [IU] via SUBCUTANEOUS

## 2013-10-13 MED ORDER — PROMETHAZINE HCL 25 MG/ML IJ SOLN
25.0000 mg | INTRAMUSCULAR | Status: DC | PRN
  Administered 2013-10-13: 25 mg via INTRAMUSCULAR
  Filled 2013-10-13: qty 1

## 2013-10-13 MED ORDER — INSULIN ASPART 100 UNIT/ML ~~LOC~~ SOLN
8.0000 [IU] | Freq: Once | SUBCUTANEOUS | Status: AC
  Administered 2013-10-13: 8 [IU] via SUBCUTANEOUS

## 2013-10-13 MED ORDER — HYDROMORPHONE HCL PF 1 MG/ML IJ SOLN
1.0000 mg | INTRAMUSCULAR | Status: DC | PRN
  Administered 2013-10-13 (×2): 1 mg via INTRAMUSCULAR
  Filled 2013-10-13 (×3): qty 1

## 2013-10-13 MED ORDER — DILTIAZEM HCL 30 MG PO TABS
30.0000 mg | ORAL_TABLET | Freq: Three times a day (TID) | ORAL | Status: DC
  Administered 2013-10-13 – 2013-10-14 (×4): 30 mg via ORAL
  Filled 2013-10-13 (×6): qty 1

## 2013-10-13 MED ORDER — HYOSCYAMINE SULFATE 0.125 MG SL SUBL
0.1250 mg | SUBLINGUAL_TABLET | Freq: Three times a day (TID) | SUBLINGUAL | Status: DC
  Administered 2013-10-13 – 2013-10-14 (×4): 0.125 mg via SUBLINGUAL
  Filled 2013-10-13 (×6): qty 1

## 2013-10-13 MED ORDER — INSULIN ASPART 100 UNIT/ML ~~LOC~~ SOLN
0.0000 [IU] | Freq: Three times a day (TID) | SUBCUTANEOUS | Status: DC
  Administered 2013-10-14 (×2): 8 [IU] via SUBCUTANEOUS

## 2013-10-13 MED ORDER — METOPROLOL TARTRATE 1 MG/ML IV SOLN
5.0000 mg | INTRAVENOUS | Status: DC | PRN
  Administered 2013-10-13 (×3): 5 mg via INTRAVENOUS
  Filled 2013-10-13 (×3): qty 5

## 2013-10-13 NOTE — Progress Notes (Addendum)
Patient ID: Mary [name], female   DOB: 01/28/1971, 43 y.o.   MRN: [id number]  [facility] PROGRESS NOTE  BULAH LURIE WUJ:[id number] DOB: [birth year] DOA: 10/12/2013 PCP: Elbert Ewings, FNP  Brief narrative: 43 y.o. female with past medical history of uncontrolled type 1 diabetes mellitus (with multiple admissions secondary to DKA), medication noncompliance, hypertension, bipolar disorder gastroparesis and neuropathy; came to the hospital complaining of severe abdominal pain, nausea and vomiting. Patient also endorsed missing doses of her insulin as she was not able to eat. Patient reports some chest pain after multiple episodes of vomiting.   In the ED patient was found to be in DKA with an anion gap of 26, sugar of 499 and bicarbonate 18. Patient also had leukocytosis of 20.8, hypocalcemia (10.8) and findings on her urine of > 1000 glucose and elevated specific gravity (more than 1.030). [facility] hospitalist has been called to admit the patient for further evaluation and treatment. Of note, in the ED abdominal and chest x-ray were done ruling out any acute abdominal/cardiopulmonary process.   Principal Problem:   DKA (diabetic ketoacidoses) - pt is clinically stable this AM - AG closed, will stop insulin drip and will place on subQ insulin - place on Lantus 10 U QHS for now and readjust the regimen as indicated  Active Problems:   Tachycardia - secondary to dehydration in the setting of DKA - will continue IVF - d/c telemetry    Bipolar disorder - stable   Abdominal pain, N/V - probably related to DKA - now better - will continue IVF, advance diet as indicated - continue analgesia as needed    DM (diabetes mellitus), type 1, uncontrolled - Lantus and SSI as noted above   Gastroparesis due to DM - continue Reglan   GERD (gastroesophageal reflux disease) - continue PPI   HTN (hypertension) - reasonable inpatient control    Leukocytosis - secondary to DKA, no clear  infectious etiology identified - repeat CBC - WBC is trending down  Consultants:  None Procedures/Studies: Dg Abd Acute W/chest  10/12/2013  Unremarkable bowel gas pattern; no free intra-abdominal air seen. No acute cardiopulmonary process seen.    Antibiotics:  None  Code Status: Full Family Communication: Pt at bedside Disposition Plan: Home when medically stable  HPI/Subjective: No events overnight.   Objective: Filed Vitals:   10/12/13 2251 10/12/13 2348 10/13/13 1101 10/13/13 1349  BP: 153/93 148/90 156/92 121/77  Pulse: 106 116 132 102  Temp: 98.4 F (36.9 C) 97.8 F (36.6 C)  98 F (36.7 C)  TempSrc:  Oral  Oral  Resp: 20 20  18   Height:  5\' 4"  (1.626 m)    Weight:  52.164 kg (115 lb)    SpO2: 99% 98%  100%    Intake/Output Summary (Last 24 hours) at 10/13/13 1815 Last data filed at 10/13/13 1400  Gross per 24 hour  Intake 716.66 ml  Output    700 ml  Net  16.66 ml    Exam:   General:  Pt is alert, follows commands appropriately, not in acute distress  Cardiovascular: Regular rhythm, tachycardic, S1/S2, no murmurs, no rubs, no gallops  Respiratory: Clear to auscultation bilaterally, no wheezing, no crackles, no rhonchi  Abdomen: Soft, non tender, non distended, bowel sounds present, no guarding  Extremities: No edema, pulses DP and PT palpable bilaterally  Neuro: Grossly nonfocal  Data Reviewed: Basic Metabolic Panel:  Recent Labs Lab 10/12/13 1810 10/13/13 0200 10/13/13 0550 10/13/13 1200  NA 138 142  144 145  K 5.1 4.6 4.4 5.1  CL 94* 102 103 107  CO2 18* 22 23 25   GLUCOSE 499* 289* 178* 149*  BUN 32* 42* 40* 35*  CREATININE 0.70 0.70 0.68 0.64  CALCIUM 10.8* 10.4 10.3 10.3   Liver Function Tests:  Recent Labs Lab 10/12/13 1810  AST 43*  ALT 55*  ALKPHOS 124*  BILITOT 0.4  PROT 9.2*  ALBUMIN 4.4    Recent Labs Lab 10/12/13 1810  LIPASE 13   CBC:  Recent Labs Lab 10/12/13 1810 10/13/13 0547  WBC 20.2* 17.9*   NEUTROABS 14.8*  --   HGB 14.7 14.8  HCT 44.9 46.4*  MCV 70.9* 71.8*  PLT 229 219   Cardiac Enzymes:  Recent Labs Lab 10/13/13 0550 10/13/13 1200  TROPONINI <0.30 <0.30   CBG:  Recent Labs Lab 10/13/13 1230 10/13/13 1348 10/13/13 1507 10/13/13 1612 10/13/13 1717  GLUCAP 137* 173* 188* 169* 139*    Recent Results (from the past 240 hour(s))  MRSA PCR SCREENING     Status: None   Collection Time    10/13/13 12:24 AM      Result Value Ref Range Status   MRSA by PCR NEGATIVE  NEGATIVE Final   Comment:            The GeneXpert MRSA Assay (FDA     approved for NASAL specimens     only), is one component of a     comprehensive MRSA colonization     surveillance program. It is not     intended to diagnose MRSA     infection nor to guide or     monitor treatment for     MRSA infections.     Scheduled Meds: . diltiazem  30 mg Oral 3 times per day  . gabapentin  600 mg Oral TID  . heparin  5,000 Units Subcutaneous 3 times per day  . hyoscyamine  0.125 mg Sublingual TID  . insulin aspart  0-15 Units Subcutaneous TID WC  . insulin aspart  0-5 Units Subcutaneous QHS  . insulin aspart  4 Units Subcutaneous TID WC  . insulin glargine  10 Units Subcutaneous QHS  . metoCLOPramide  10 mg Oral TID AC & HS  . nicotine  14 mg Transdermal Daily  . pantoprazole  40 mg Oral BID  . risperidone  2 mg Oral QHS   Continuous Infusions: . sodium chloride 125 mL/hr at 10/13/13 1406   Iskra Barbera Setters, MD  [facility] Pager [phone]  If 7PM-7AM, please contact night-coverage [email or web address] Password [email or web address] 10/13/2013, 6:15 PM   LOS: 1 day

## 2013-10-13 NOTE — Progress Notes (Signed)
Pt HR 140s and sustaining from 10:21 to 10:57. Asymptomatic. Notified Dr. Doyle Askew. New orders received.  Will continue to monitor.

## 2013-10-13 NOTE — Progress Notes (Signed)
Inpatient Diabetes Program Recommendations  AACE/ADA: New Consensus Statement on Inpatient Glycemic Control (2013)  Target Ranges:  Prepandial:   less than 140 mg/dL      Peak postprandial:   less than 180 mg/dL (1-2 hours)      Critically ill patients:  140 - 180 mg/dL   Reason for Visit: Diabetes Consult  Diabetes history: DM1.5 Outpatient Diabetes medications: 70/30 35 units bid, no correction insulin Current orders for Inpatient glycemic control: GlucoStabilizer  Pt in bed with covers pulled over face and mumbled when asked about her insulin use at home. Had eyes closed and states she doesn't feel good. Will be ready to transition off insulin drip likely this afternoon. Very familiar with pt from previous admissions. Discussed recommendations with RN.  Results for ALIANIS, TRIMMER (MRN [id number]) as of 10/13/2013 12:08  Ref. Range 10/13/2013 05:50  Sodium Latest Range: 137-147 mEq/L 144  Potassium Latest Range: 3.7-5.3 mEq/L 4.4  Chloride Latest Range: 96-112 mEq/L 103  CO2 Latest Range: 19-32 mEq/L 23  BUN Latest Range: 6-23 mg/dL 40 (H)  Creatinine Latest Range: 0.50-1.10 mg/dL 0.68  Calcium Latest Range: 8.4-10.5 mg/dL 10.3  GFR calc non Af Amer Latest Range: >90 mL/min >90  GFR calc Af Amer Latest Range: >90 mL/min >90  Glucose Latest Range: 70-99 mg/dL 178 (H)   DKA resolving. Awaiting updated BMET. When ready for transition to basal-bolus insulin, give Lantus 1-2 hours prior to discontinuation of drip.  Inpatient Diabetes Program Recommendations Insulin - IV drip/GlucoStabilizer: Continue with GlucoStabilizer until acidosis is cleared Insulin - Basal: Lantus 20 units Q24H and begin 1-2 hours prior to discontinuation of insulin drip Correction (SSI): Novolog sensitive tidwc and hs Insulin - Meal Coverage: Novolog 6 units tidwc for meal coverage insulin if pt eats >50% meal. HgbA1C: Need updated HgbA1C to assess glycemic control prior to hospitalization Diet: When advanced,  CHO mod med  Note: Will continue to follow closely. Thank you. Lorenda Peck, RD, LDN, CDE Inpatient Diabetes Coordinator [phone]

## 2013-10-13 NOTE — Progress Notes (Signed)
Pt. Heart rate is elevated to 140's-160's. N.P. Informed and ordered Metoprolol. Pt HR decreased to 100-110's. Will continue to monitor.

## 2013-10-14 DIAGNOSIS — R109 Unspecified abdominal pain: Secondary | ICD-10-CM | POA: Diagnosis not present

## 2013-10-14 DIAGNOSIS — F3189 Other bipolar disorder: Secondary | ICD-10-CM | POA: Diagnosis not present

## 2013-10-14 DIAGNOSIS — F319 Bipolar disorder, unspecified: Secondary | ICD-10-CM | POA: Diagnosis not present

## 2013-10-14 LAB — COMPREHENSIVE METABOLIC PANEL
ALT: 44 U/L — ABNORMAL HIGH (ref 0–35)
AST: 61 U/L — ABNORMAL HIGH (ref 0–37)
Albumin: 3.1 g/dL — ABNORMAL LOW (ref 3.5–5.2)
Alkaline Phosphatase: 86 U/L (ref 39–117)
BUN: 24 mg/dL — ABNORMAL HIGH (ref 6–23)
CALCIUM: 9.1 mg/dL (ref 8.4–10.5)
CO2: 19 mEq/L (ref 19–32)
CREATININE: 0.58 mg/dL (ref 0.50–1.10)
Chloride: 105 mEq/L (ref 96–112)
GLUCOSE: 215 mg/dL — AB (ref 70–99)
POTASSIUM: 4.2 meq/L (ref 3.7–5.3)
SODIUM: 141 meq/L (ref 137–147)
Total Bilirubin: 0.4 mg/dL (ref 0.3–1.2)
Total Protein: 6.6 g/dL (ref 6.0–8.3)

## 2013-10-14 LAB — CBC
HCT: 39.2 % (ref 36.0–46.0)
HEMOGLOBIN: 12.5 g/dL (ref 12.0–15.0)
MCH: 23.1 pg — ABNORMAL LOW (ref 26.0–34.0)
MCHC: 31.9 g/dL (ref 30.0–36.0)
MCV: 72.5 fL — ABNORMAL LOW (ref 78.0–100.0)
Platelets: 130 10*3/uL — ABNORMAL LOW (ref 150–400)
RBC: 5.41 MIL/uL — AB (ref 3.87–5.11)
RDW: 14.7 % (ref 11.5–15.5)
WBC: 15 10*3/uL — ABNORMAL HIGH (ref 4.0–10.5)

## 2013-10-14 LAB — GLUCOSE, CAPILLARY
GLUCOSE-CAPILLARY: 243 mg/dL — AB (ref 70–99)
Glucose-Capillary: 211 mg/dL — ABNORMAL HIGH (ref 70–99)
Glucose-Capillary: 263 mg/dL — ABNORMAL HIGH (ref 70–99)
Glucose-Capillary: 272 mg/dL — ABNORMAL HIGH (ref 70–99)

## 2013-10-14 MED ORDER — ONDANSETRON HCL 4 MG PO TABS: 4.0000 mg | ORAL_TABLET | Freq: Four times a day (QID) | ORAL | Status: DC

## 2013-10-14 MED ORDER — OXYCODONE-ACETAMINOPHEN 5-325 MG PO TABS: 1.0000 | ORAL_TABLET | Freq: Three times a day (TID) | ORAL | Status: DC | PRN

## 2013-10-14 MED ORDER — HYOSCYAMINE SULFATE 0.125 MG SL SUBL: 0.1250 mg | SUBLINGUAL_TABLET | Freq: Three times a day (TID) | SUBLINGUAL | Status: DC

## 2013-10-14 MED ORDER — HYDROMORPHONE HCL PF 1 MG/ML IJ SOLN
1.0000 mg | INTRAMUSCULAR | Status: DC | PRN
  Administered 2013-10-14: 1 mg via INTRAVENOUS

## 2013-10-14 NOTE — Discharge Summary (Signed)
Physician Discharge Summary  Mary [name] HCW:[id number] DOB: [birth year] DOA: 10/12/2013  PCP: Elbert Ewings, FNP  Admit date: 10/12/2013 Discharge date: 10/14/2013  Recommendations for Outpatient Follow-up:  1. Pt will need to follow up with PCP in 2-3 weeks post discharge 2. Please obtain BMP to evaluate electrolytes and kidney function 3. Please also check CBC to evaluate Hg and Hct levels  Discharge Diagnoses: DKA Principal Problem:   DKA (diabetic ketoacidoses) Active Problems:   Bipolar disorder   Abdominal pain   DM (diabetes mellitus), type 1, uncontrolled   Nausea with vomiting   Gastroparesis due to DM   Chronic abdominal pain   GERD (gastroesophageal reflux disease)   HTN (hypertension)  Discharge Condition: Stable  Diet recommendation: Heart healthy diet discussed in details   Brief narrative:  43 y.o. female with past medical history of uncontrolled type 1 diabetes mellitus (with multiple admissions secondary to DKA), medication noncompliance, hypertension, bipolar disorder gastroparesis and neuropathy; came to the hospital complaining of severe abdominal pain, nausea and vomiting. Patient also endorsed missing doses of her insulin as she was not able to eat. Patient reports some chest pain after multiple episodes of vomiting.   In the ED patient was found to be in DKA with an anion gap of 26, sugar of 499 and bicarbonate 18. Patient also had leukocytosis of 20.8, hypocalcemia (10.8) and findings on her urine of > 1000 glucose and elevated specific gravity (more than 1.030). [facility] has been called to admit the patient for further evaluation and treatment. Of note, in the ED abdominal and chest x-ray were done ruling out any acute abdominal/cardiopulmonary process.   Principal Problem:  DKA (diabetic ketoacidoses)  - pt is clinically stable this AM  - AG closed, off insulin drip for over 24 hours, and tolerating regular diet well  - pt discharged on  Insulin Novolin 35 U BID  Active Problems:  Tachycardia  - secondary to dehydration in the setting of DKA  - given IVF and has responded well  - no events on tele - resolved  Bipolar disorder  - stable  Abdominal pain, N/V  - probably related to DKA - now better  - continue analgesia as needed  DM (diabetes mellitus), type 1, uncontrolled  - Insulin Novolin as noted above  Gastroparesis due to DM  - continue Reglan  GERD (gastroesophageal reflux disease)  - continue PPI  HTN (hypertension)  - reasonable inpatient control  Leukocytosis  - secondary to DKA, no clear infectious etiology identified  - WBC is trending down   Consultants:  Diabetic educator  Procedures/Studies:  Dg Abd Acute W/chest 10/12/2013 Unremarkable bowel gas pattern; no free intra-abdominal air seen. No acute cardiopulmonary process seen.  Antibiotics:  None  Code Status: Full  Family Communication: Pt at bedside   Discharge Exam: Filed Vitals:   10/14/13 1245  BP: 120/71  Pulse: 97  Temp: 97.5 F (36.4 C)  Resp: 20   Filed Vitals:   10/13/13 2051 10/14/13 0543 10/14/13 1028 10/14/13 1245  BP: 117/80 130/78 107/68 120/71  Pulse: 115 79 94 97  Temp: 98.3 F (36.8 C) 98.4 F (36.9 C) 98.2 F (36.8 C) 97.5 F (36.4 C)  TempSrc: Oral Oral Oral Oral  Resp: 20 20 20 20   Height:      Weight:      SpO2: 100% 100% 100% 100%    General: Pt is alert, follows commands appropriately, not in acute distress Cardiovascular: Regular rate and rhythm, S1/S2 +, no  murmurs, no rubs, no gallops Respiratory: Clear to auscultation bilaterally, no wheezing, no crackles, no rhonchi Abdominal: Soft, non tender, non distended, bowel sounds +, no guarding Extremities: no edema, no cyanosis, pulses palpable bilaterally DP and PT Neuro: Grossly nonfocal  Discharge Instructions  Discharge Orders   Future Appointments Provider Department Dept Phone   11/05/2013 1:30 PM Inda Castle, MD [place]  [facility] [phone]   Future Orders Complete By Expires   Diet - low sodium heart healthy  As directed    Increase activity slowly  As directed        Medication List    STOP taking these medications       insulin glargine 100 UNIT/ML injection  Commonly known as:  LANTUS      TAKE these medications       aspirin EC 81 MG tablet  Take 81 mg by mouth daily.     diphenhydrAMINE 25 MG tablet  Commonly known as:  BENADRYL  Take 25 mg by mouth every 4 (four) hours as needed for itching. Take with reglan as needed     gabapentin 400 MG capsule  Commonly known as:  NEURONTIN  Take 600 mg by mouth 3 (three) times daily.     hydrochlorothiazide 12.5 MG capsule  Commonly known as:  MICROZIDE  Take 12.5 mg by mouth daily.     hyoscyamine 0.125 MG SL tablet  Commonly known as:  LEVSIN SL  Place 1 tablet (0.125 mg total) under the tongue 3 (three) times daily.     insulin NPH-regular Human (70-30) 100 UNIT/ML injection  Commonly known as:  NOVOLIN 70/30  Inject 35 Units into the skin 2 (two) times daily with a meal.     medroxyPROGESTERone 10 MG tablet  Commonly known as:  PROVERA  Take 2 tablets (20 mg total) by mouth daily.     meloxicam 15 MG tablet  Commonly known as:  MOBIC  Take 15 mg by mouth daily.     metFORMIN 1000 MG tablet  Commonly known as:  GLUCOPHAGE  Take 1,000 mg by mouth 2 (two) times daily.     metoCLOPramide 10 MG tablet  Commonly known as:  REGLAN  Take 1 tablet (10 mg total) by mouth 4 (four) times daily -  before meals and at bedtime.     ondansetron 4 MG tablet  Commonly known as:  ZOFRAN  Take 1 tablet (4 mg total) by mouth every 6 (six) hours.     oxyCODONE-acetaminophen 5-325 MG per tablet  Commonly known as:  PERCOCET/ROXICET  Take 1 tablet by mouth every 8 (eight) hours as needed for severe pain.     risperidone 4 MG tablet  Commonly known as:  RISPERDAL  Take 2 mg by mouth at bedtime.     sitaGLIPtin 100 MG tablet   Commonly known as:  JANUVIA  Take 100 mg by mouth daily.           Follow-up Information   Schedule an appointment as soon as possible for a visit with [place], [place].   Specialty:  Nurse Practitioner   Contact information:   [address] [place] [address] [phone] [id number]       Follow up with Faye Ramsay, MD. (As needed, If symptoms worsen, call my cell phone [phone])    Specialty:  Internal Medicine   Contact information:   [address] [place]  [address] [phone]        The results of  significant diagnostics from this hospitalization (including imaging, microbiology, ancillary and laboratory) are listed below for reference.     Microbiology: Recent Results (from the past 240 hour(s))  MRSA PCR SCREENING     Status: None   Collection Time    10/13/13 12:24 AM      Result Value Ref Range Status   MRSA by PCR NEGATIVE  NEGATIVE Final   Comment:            The GeneXpert MRSA Assay (FDA     approved for NASAL specimens     only), is one component of a     comprehensive MRSA colonization     surveillance program. It is not     intended to diagnose MRSA     infection nor to guide or     monitor treatment for     MRSA infections.     Labs: Basic Metabolic Panel:  Recent Labs Lab 10/12/13 1810 10/13/13 0200 10/13/13 0550 10/13/13 1200 10/14/13 0445  NA 138 142 144 145 141  K 5.1 4.6 4.4 5.1 4.2  CL 94* 102 103 107 105  CO2 18* 22 23 25 19   GLUCOSE 499* 289* 178* 149* 215*  BUN 32* 42* 40* 35* 24*  CREATININE 0.70 0.70 0.68 0.64 0.58  CALCIUM 10.8* 10.4 10.3 10.3 9.1   Liver Function Tests:  Recent Labs Lab 10/12/13 1810 10/14/13 0445  AST 43* 61*  ALT 55* 44*  ALKPHOS 124* 86  BILITOT 0.4 0.4  PROT 9.2* 6.6  ALBUMIN 4.4 3.1*    Recent Labs Lab 10/12/13 1810  LIPASE 13   No results found for this basename: AMMONIA,  in the last 168 hours CBC:  Recent Labs Lab 10/12/13 1810 10/13/13 0547  10/14/13 0445  WBC 20.2* 17.9* 15.0*  NEUTROABS 14.8*  --   --   HGB 14.7 14.8 12.5  HCT 44.9 46.4* 39.2  MCV 70.9* 71.8* 72.5*  PLT 229 219 130*   Cardiac Enzymes:  Recent Labs Lab 10/13/13 0550 10/13/13 1200  TROPONINI <0.30 <0.30   CBG:  Recent Labs Lab 10/13/13 2218 10/14/13 0156 10/14/13 0658 10/14/13 0737 10/14/13 1153  GLUCAP 394* 211* 243* 263* 272*     SIGNED: Time coordinating discharge: Over 30 minutes  Theodis Blaze, MD  [facility] 10/14/2013, 1:25 PM Pager [phone]  If 7PM-7AM, please contact night-coverage [email or web address] Password [email or web address]

## 2013-10-14 NOTE — Discharge Instructions (Signed)
Diabetic Ketoacidosis °Diabetic ketoacidosis (DKA) is a life-threatening complication of type 1 diabetes. It must be quickly recognized and treated. Treatment requires hospitalization. °CAUSES  °When there is no insulin in the body, glucose (sugar) cannot be used and the body breaks down fat for energy. When fat breaks down, acids (ketones) build up in the blood. Very high levels of glucose and high levels of acids lead to severe loss of body fluids (dehydration) and other dangerous chemical changes. This stresses your vital organs and can cause coma or death. °SYMPTOMS  °· Tiredness (fatigue). °· Weight loss. °· Excessive thirst. °· Ketones in the urine. °· Lightheadedness. °· Fruity or sweet smell on your breath. °· Excessive urination. °· Visual changes. °· Confusion or irritability. °· Feeling sick to your stomach (nauseous) or vomiting. °· Rapid breathing. °· Stomachache or belly (abdominal) pain. °DIAGNOSIS  °Your caregiver will diagnose DKA based on your history, physical exam, and blood tests. Your caregiver will check if there is another illness present which caused you to go into DKA. Most of this will be done quickly in an emergency room. °TREATMENT  °· Fluid replacement to correct dehydration. °· Insulin. °· Correction of electrolytes, such as potassium and sodium. °· Medicines (antibiotics) that kill germs for infections. °PREVENTION °· Always take your insulin. Do not skip your insulin injections. °· If you are ill, treat yourself quickly. Your body often needs more insulin to fight the illness. °· Check your blood glucose regularly. °· Check urine ketones if your blood glucose is greater than 240 milligrams per deciliter (mg/dl). °· Do not used expired or outdated insulin. °· If your blood glucose is high, drink plenty of fluids. This helps flush out ketones. °HOME CARE INSTRUCTIONS  °· If you are ill, follow the advice of your caregiver. °· To prevent loss of body fluids (dehydration), drink enough  water and fluids to keep your urine clear or pale yellow. °· If you cannot eat, alternate between drinking fluids with sugar (soda, juices, flavored gelatin) and salty fluids (broth, bouillon). °· If you can eat, follow your usual diet and drink sugar-free liquids (water, diet drinks). °· Always take your usual dose of insulin. If you cannot eat, or your glucose is getting too low, call your caregiver for further instructions. °· Continue to monitor your blood or urine ketones every 3 to 4 hours around the clock. Set your alarm clock or have someone wake you up. If you are too sick, have someone test it for you. °· Rest and avoid exercise. °SEEK MEDICAL CARE IF:  °· You have ketones in your urine or your blood glucose is higher than a level your caregiver suggests. You may need extra insulin. Call your caregiver if you need advice on adjusting your insulin. °· You cannot drink at least a tablespoon of fluid every 15 to 20 minutes. °· You have been throwing up for more than 2 hours. °· You have symptoms of DKA: °· Fruity smelling breath. °· Breathing faster or slower. °· Becoming very sleepy. °SEEK IMMEDIATE MEDICAL CARE IF:  °· You have signs of dehydration: °· Decreased urination. °· Increased thirst. °· Dry skin and mouth. °· Lightheadedness. °· Your blood glucose is very high (as advised by your caregiver) twice in a row. °· You or your child has an oral temperature above 102° F (38.9° C), not controlled by medicine. °· You pass out. °· You have chest pain and/or trouble breathing. °· You have a sudden, severe headache. °· You have sudden   weakness in one arm and/or one leg. °· You have sudden difficulty speaking and/or swallowing. °· You develop vomiting and/or diarrhea that is getting worse after 3 to 4 hours. °· You have abdominal pain. °MAKE SURE YOU:  °· Understand these instructions. °· Will watch your condition. °· Will get help right away if you are not doing well or get worse. °Document Released:  05/31/2000 Document Revised: 08/26/2011 Document Reviewed: 12/07/2008 °Exit[employer]® Patient Information ©2014 [employer]. ° °

## 2013-10-14 NOTE — Care Management Note (Signed)
    Page 1 of 1   10/14/2013     3:58:43 PM CARE MANAGEMENT NOTE 10/14/2013  Patient:  Mary [name], Mary [name]   Account Number:  [id number]  Date Initiated:  10/14/2013  Documentation initiated by:  [facility]  Subjective/Objective Assessment:   43 Y/O F ADMITTED W/DKA.     Action/Plan:   FROM [email or web address] GLUCOMETER.   Anticipated DC Date:  10/14/2013   Anticipated DC Plan:  HOME/SELF CARE  In-house referral  Financial Counselor      DC Planning Services  CM consult      Choice offered to / List presented to:             Status of service:  Completed, signed off Medicare Important Message given?   (If response is "NO", the following Medicare IM given date fields will be blank) Date Medicare IM given:   Date Additional Medicare IM given:    Discharge Disposition:  HOME/SELF CARE  Per UR Regulation:  Reviewed for med. necessity/level of care/duration of stay  If discussed at [place] of Stay Meetings, dates discussed:    Comments:  10/14/13 Keian Odriscoll RN,BSN NCM [phone] D/C HOME NO [place].

## 2013-10-21 ENCOUNTER — Emergency Department (HOSPITAL_COMMUNITY): Payer: Self-pay

## 2013-10-21 ENCOUNTER — Inpatient Hospital Stay (HOSPITAL_COMMUNITY)
Admission: EM | Admit: 2013-10-21 | Discharge: 2013-10-23 | DRG: 638 | Disposition: A | Discharge status: Home / Self Care | Payer: Medicaid Other | Attending: Internal Medicine | Admitting: Internal Medicine

## 2013-10-21 ENCOUNTER — Encounter (HOSPITAL_COMMUNITY): Payer: Self-pay | Admitting: Emergency Medicine

## 2013-10-21 DIAGNOSIS — F17200 Nicotine dependence, unspecified, uncomplicated: Secondary | ICD-10-CM | POA: Diagnosis present

## 2013-10-21 DIAGNOSIS — B1920 Unspecified viral hepatitis C without hepatic coma: Secondary | ICD-10-CM | POA: Diagnosis present

## 2013-10-21 DIAGNOSIS — N938 Other specified abnormal uterine and vaginal bleeding: Secondary | ICD-10-CM

## 2013-10-21 DIAGNOSIS — E1065 Type 1 diabetes mellitus with hyperglycemia: Secondary | ICD-10-CM | POA: Diagnosis present

## 2013-10-21 DIAGNOSIS — K7460 Unspecified cirrhosis of liver: Secondary | ICD-10-CM | POA: Diagnosis present

## 2013-10-21 DIAGNOSIS — R109 Unspecified abdominal pain: Secondary | ICD-10-CM | POA: Diagnosis not present

## 2013-10-21 DIAGNOSIS — F319 Bipolar disorder, unspecified: Secondary | ICD-10-CM | POA: Diagnosis present

## 2013-10-21 DIAGNOSIS — K8020 Calculus of gallbladder without cholecystitis without obstruction: Secondary | ICD-10-CM

## 2013-10-21 DIAGNOSIS — D72829 Elevated white blood cell count, unspecified: Secondary | ICD-10-CM | POA: Diagnosis present

## 2013-10-21 DIAGNOSIS — E1049 Type 1 diabetes mellitus with other diabetic neurological complication: Secondary | ICD-10-CM | POA: Diagnosis present

## 2013-10-21 DIAGNOSIS — G8929 Other chronic pain: Secondary | ICD-10-CM

## 2013-10-21 DIAGNOSIS — E1110 Type 2 diabetes mellitus with ketoacidosis without coma: Secondary | ICD-10-CM | POA: Diagnosis not present

## 2013-10-21 DIAGNOSIS — Z7982 Long term (current) use of aspirin: Secondary | ICD-10-CM

## 2013-10-21 DIAGNOSIS — K3184 Gastroparesis: Secondary | ICD-10-CM | POA: Diagnosis present

## 2013-10-21 DIAGNOSIS — Z9119 Patient's noncompliance with other medical treatment and regimen: Secondary | ICD-10-CM

## 2013-10-21 DIAGNOSIS — Z598 Other problems related to housing and economic circumstances: Secondary | ICD-10-CM

## 2013-10-21 DIAGNOSIS — N946 Dysmenorrhea, unspecified: Secondary | ICD-10-CM

## 2013-10-21 DIAGNOSIS — E1010 Type 1 diabetes mellitus with ketoacidosis without coma: Principal | ICD-10-CM | POA: Diagnosis present

## 2013-10-21 DIAGNOSIS — F3181 Bipolar II disorder: Secondary | ICD-10-CM

## 2013-10-21 DIAGNOSIS — Z794 Long term (current) use of insulin: Secondary | ICD-10-CM

## 2013-10-21 DIAGNOSIS — D509 Iron deficiency anemia, unspecified: Secondary | ICD-10-CM

## 2013-10-21 DIAGNOSIS — E1149 Type 2 diabetes mellitus with other diabetic neurological complication: Secondary | ICD-10-CM

## 2013-10-21 DIAGNOSIS — Z87442 Personal history of urinary calculi: Secondary | ICD-10-CM

## 2013-10-21 DIAGNOSIS — M129 Arthropathy, unspecified: Secondary | ICD-10-CM | POA: Diagnosis present

## 2013-10-21 DIAGNOSIS — I10 Essential (primary) hypertension: Secondary | ICD-10-CM | POA: Diagnosis present

## 2013-10-21 DIAGNOSIS — Z833 Family history of diabetes mellitus: Secondary | ICD-10-CM

## 2013-10-21 DIAGNOSIS — F3110 Bipolar disorder, current episode manic without psychotic features, unspecified: Secondary | ICD-10-CM | POA: Diagnosis present

## 2013-10-21 DIAGNOSIS — E876 Hypokalemia: Secondary | ICD-10-CM

## 2013-10-21 DIAGNOSIS — D259 Leiomyoma of uterus, unspecified: Secondary | ICD-10-CM

## 2013-10-21 DIAGNOSIS — G40909 Epilepsy, unspecified, not intractable, without status epilepticus: Secondary | ICD-10-CM

## 2013-10-21 DIAGNOSIS — F4310 Post-traumatic stress disorder, unspecified: Secondary | ICD-10-CM | POA: Diagnosis present

## 2013-10-21 DIAGNOSIS — Z720 Tobacco use: Secondary | ICD-10-CM

## 2013-10-21 DIAGNOSIS — M4120 Other idiopathic scoliosis, site unspecified: Secondary | ICD-10-CM | POA: Diagnosis present

## 2013-10-21 DIAGNOSIS — K219 Gastro-esophageal reflux disease without esophagitis: Secondary | ICD-10-CM

## 2013-10-21 DIAGNOSIS — Z9114 Patient's other noncompliance with medication regimen: Secondary | ICD-10-CM

## 2013-10-21 DIAGNOSIS — Z91148 Patient's other noncompliance with medication regimen for other reason: Secondary | ICD-10-CM

## 2013-10-21 DIAGNOSIS — IMO0002 Reserved for concepts with insufficient information to code with codable children: Secondary | ICD-10-CM

## 2013-10-21 DIAGNOSIS — E139 Other specified diabetes mellitus without complications: Secondary | ICD-10-CM

## 2013-10-21 DIAGNOSIS — Z91199 Patient's noncompliance with other medical treatment and regimen due to unspecified reason: Secondary | ICD-10-CM

## 2013-10-21 DIAGNOSIS — Z5987 Material hardship due to limited financial resources, not elsewhere classified: Secondary | ICD-10-CM

## 2013-10-21 DIAGNOSIS — E1143 Type 2 diabetes mellitus with diabetic autonomic (poly)neuropathy: Secondary | ICD-10-CM | POA: Diagnosis present

## 2013-10-21 DIAGNOSIS — B182 Chronic viral hepatitis C: Secondary | ICD-10-CM

## 2013-10-21 DIAGNOSIS — R112 Nausea with vomiting, unspecified: Secondary | ICD-10-CM | POA: Diagnosis present

## 2013-10-21 DIAGNOSIS — E1142 Type 2 diabetes mellitus with diabetic polyneuropathy: Secondary | ICD-10-CM | POA: Diagnosis present

## 2013-10-21 LAB — COMPREHENSIVE METABOLIC PANEL
ALT: 73 U/L — ABNORMAL HIGH (ref 0–35)
AST: 87 U/L — ABNORMAL HIGH (ref 0–37)
Albumin: 4 g/dL (ref 3.5–5.2)
Alkaline Phosphatase: 102 U/L (ref 39–117)
BILIRUBIN TOTAL: 0.3 mg/dL (ref 0.3–1.2)
BUN: 16 mg/dL (ref 6–23)
CO2: 19 mEq/L (ref 19–32)
CREATININE: 0.46 mg/dL — AB (ref 0.50–1.10)
Calcium: 9.4 mg/dL (ref 8.4–10.5)
Chloride: 97 mEq/L (ref 96–112)
GFR calc non Af Amer: >90 mL/min (ref >90)
Glucose, Bld: 423 mg/dL — ABNORMAL HIGH (ref 70–99)
POTASSIUM: 5.3 meq/L (ref 3.7–5.3)
Sodium: 137 mEq/L (ref 137–147)
TOTAL PROTEIN: 8.1 g/dL (ref 6.0–8.3)

## 2013-10-21 LAB — CBC WITH DIFFERENTIAL/PLATELET
BASOS PCT: 0 % (ref 0–1)
Basophils Absolute: 0 10*3/uL (ref 0.0–0.1)
Eosinophils Absolute: 0.1 10*3/uL (ref 0.0–0.7)
Eosinophils Relative: 1 % (ref 0–5)
HCT: 38.9 % (ref 36.0–46.0)
HEMOGLOBIN: 12.6 g/dL (ref 12.0–15.0)
LYMPHS PCT: 30 % (ref 12–46)
Lymphs Abs: 4 10*3/uL (ref 0.7–4.0)
MCH: 23.1 pg — ABNORMAL LOW (ref 26.0–34.0)
MCHC: 32.4 g/dL (ref 30.0–36.0)
MCV: 71.4 fL — AB (ref 78.0–100.0)
Monocytes Absolute: 0.5 10*3/uL (ref 0.1–1.0)
Monocytes Relative: 4 % (ref 3–12)
Neutro Abs: 8.7 10*3/uL — ABNORMAL HIGH (ref 1.7–7.7)
Neutrophils Relative %: 65 % (ref 43–77)
Platelets: 216 10*3/uL (ref 150–400)
RBC: 5.45 MIL/uL — ABNORMAL HIGH (ref 3.87–5.11)
RDW: 14.3 % (ref 11.5–15.5)
WBC: 13.3 10*3/uL — ABNORMAL HIGH (ref 4.0–10.5)

## 2013-10-21 LAB — BASIC METABOLIC PANEL
BUN: 14 mg/dL (ref 6–23)
BUN: 13 mg/dL (ref 6–23)
BUN: 13 mg/dL (ref 6–23)
CALCIUM: 9.4 mg/dL (ref 8.4–10.5)
CALCIUM: 9.2 mg/dL (ref 8.4–10.5)
CHLORIDE: 103 meq/L (ref 96–112)
CO2: 21 mEq/L (ref 19–32)
CO2: 23 mEq/L (ref 19–32)
CO2: 21 mEq/L (ref 19–32)
Calcium: 9.3 mg/dL (ref 8.4–10.5)
Chloride: 102 mEq/L (ref 96–112)
Chloride: 103 mEq/L (ref 96–112)
Creatinine, Ser: 0.51 mg/dL (ref 0.50–1.10)
Creatinine, Ser: 0.53 mg/dL (ref 0.50–1.10)
Creatinine, Ser: 0.48 mg/dL — ABNORMAL LOW (ref 0.50–1.10)
GFR calc Af Amer: >90 mL/min (ref >90)
GLUCOSE: 234 mg/dL — AB (ref 70–99)
Glucose, Bld: 245 mg/dL — ABNORMAL HIGH (ref 70–99)
Glucose, Bld: 194 mg/dL — ABNORMAL HIGH (ref 70–99)
POTASSIUM: 3.7 meq/L (ref 3.7–5.3)
Potassium: 4.4 mEq/L (ref 3.7–5.3)
Potassium: 4.7 mEq/L (ref 3.7–5.3)
SODIUM: 142 meq/L (ref 137–147)
Sodium: 143 mEq/L (ref 137–147)
Sodium: 140 mEq/L (ref 137–147)

## 2013-10-21 LAB — GLUCOSE, CAPILLARY
GLUCOSE-CAPILLARY: 211 mg/dL — AB (ref 70–99)
GLUCOSE-CAPILLARY: 179 mg/dL — AB (ref 70–99)
GLUCOSE-CAPILLARY: 134 mg/dL — AB (ref 70–99)
GLUCOSE-CAPILLARY: 198 mg/dL — AB (ref 70–99)
Glucose-Capillary: 230 mg/dL — ABNORMAL HIGH (ref 70–99)
Glucose-Capillary: 235 mg/dL — ABNORMAL HIGH (ref 70–99)
Glucose-Capillary: 167 mg/dL — ABNORMAL HIGH (ref 70–99)
Glucose-Capillary: 162 mg/dL — ABNORMAL HIGH (ref 70–99)
Glucose-Capillary: 185 mg/dL — ABNORMAL HIGH (ref 70–99)

## 2013-10-21 LAB — LIPASE, BLOOD: LIPASE: 39 U/L (ref 11–59)

## 2013-10-21 LAB — URINALYSIS, ROUTINE W REFLEX MICROSCOPIC
BILIRUBIN URINE: NEGATIVE
Glucose, UA: >1000 mg/dL — AB
KETONES UR: 40 mg/dL — AB
Leukocytes, UA: NEGATIVE
NITRITE: NEGATIVE
Protein, ur: NEGATIVE mg/dL
SPECIFIC GRAVITY, URINE: 1.035 — AB (ref 1.005–1.030)
UROBILINOGEN UA: 0.2 mg/dL (ref 0.0–1.0)
pH: 5.5 (ref 5.0–8.0)

## 2013-10-21 LAB — TROPONIN I: Troponin I: <0.3 ng/mL (ref <0.30)

## 2013-10-21 LAB — URINE MICROSCOPIC-ADD ON

## 2013-10-21 LAB — CBG MONITORING, ED
GLUCOSE-CAPILLARY: 330 mg/dL — AB (ref 70–99)
Glucose-Capillary: 360 mg/dL — ABNORMAL HIGH (ref 70–99)

## 2013-10-21 MED ORDER — SODIUM CHLORIDE 0.9 % IV SOLN: INTRAVENOUS | Status: DC

## 2013-10-21 MED ORDER — METOCLOPRAMIDE HCL 5 MG/ML IJ SOLN
10.0000 mg | Freq: Four times a day (QID) | INTRAMUSCULAR | Status: DC
  Administered 2013-10-21 – 2013-10-23 (×9): 10 mg via INTRAVENOUS
  Filled 2013-10-21 (×13): qty 2

## 2013-10-21 MED ORDER — POTASSIUM CHLORIDE 10 MEQ/100ML IV SOLN
10.0000 meq | INTRAVENOUS | Status: AC
  Administered 2013-10-21 – 2013-10-22 (×2): 10 meq via INTRAVENOUS
  Filled 2013-10-21 (×2): qty 100

## 2013-10-21 MED ORDER — MORPHINE SULFATE 2 MG/ML IJ SOLN
1.0000 mg | INTRAMUSCULAR | Status: DC | PRN
  Administered 2013-10-21 – 2013-10-23 (×8): 1 mg via INTRAVENOUS
  Filled 2013-10-21 (×8): qty 1

## 2013-10-21 MED ORDER — SODIUM CHLORIDE 0.9 % IV SOLN
INTRAVENOUS | Status: DC
  Administered 2013-10-21: 3 [IU]/h via INTRAVENOUS
  Filled 2013-10-21 (×2): qty 1

## 2013-10-21 MED ORDER — POTASSIUM CHLORIDE 10 MEQ/100ML IV SOLN: 10.0000 meq | INTRAVENOUS | Status: DC

## 2013-10-21 MED ORDER — ENOXAPARIN SODIUM 40 MG/0.4ML ~~LOC~~ SOLN: 40.0000 mg | SUBCUTANEOUS | Status: DC

## 2013-10-21 MED ORDER — SODIUM CHLORIDE 0.9 % IV SOLN
INTRAVENOUS | Status: DC
  Administered 2013-10-21: 3.2 [IU]/h via INTRAVENOUS
  Administered 2013-10-21: 5.3 [IU]/h via INTRAVENOUS
  Administered 2013-10-21: 1.8 [IU]/h via INTRAVENOUS
  Administered 2013-10-21: 2.4 [IU]/h via INTRAVENOUS
  Administered 2013-10-21: 3 [IU]/h via INTRAVENOUS
  Filled 2013-10-21: qty 1

## 2013-10-21 MED ORDER — SODIUM CHLORIDE 0.9 % IV SOLN: INTRAVENOUS | Status: AC

## 2013-10-21 MED ORDER — FENTANYL CITRATE 0.05 MG/ML IJ SOLN
50.0000 ug | Freq: Once | INTRAMUSCULAR | Status: AC
  Administered 2013-10-21: 50 ug via INTRAVENOUS
  Filled 2013-10-21: qty 2

## 2013-10-21 MED ORDER — DEXTROSE-NACL 5-0.45 % IV SOLN: INTRAVENOUS | Status: DC

## 2013-10-21 MED ORDER — ONDANSETRON HCL 4 MG/2ML IJ SOLN
4.0000 mg | Freq: Once | INTRAMUSCULAR | Status: AC
  Administered 2013-10-21: 4 mg via INTRAVENOUS
  Filled 2013-10-21: qty 2

## 2013-10-21 MED ORDER — ONDANSETRON HCL 4 MG/2ML IJ SOLN
4.0000 mg | Freq: Four times a day (QID) | INTRAMUSCULAR | Status: DC | PRN
  Administered 2013-10-21 – 2013-10-23 (×5): 4 mg via INTRAVENOUS
  Filled 2013-10-21 (×5): qty 2

## 2013-10-21 MED ORDER — HYDRALAZINE HCL 20 MG/ML IJ SOLN
5.0000 mg | Freq: Four times a day (QID) | INTRAMUSCULAR | Status: DC | PRN
  Filled 2013-10-21: qty 0.25

## 2013-10-21 MED ORDER — DEXTROSE-NACL 5-0.45 % IV SOLN
INTRAVENOUS | Status: DC
  Administered 2013-10-21: 15:00:00 via INTRAVENOUS

## 2013-10-21 MED ORDER — DEXTROSE 50 % IV SOLN: 25.0000 mL | INTRAVENOUS | Status: DC | PRN

## 2013-10-21 MED ORDER — SODIUM CHLORIDE 0.9 % IV SOLN
1000.0000 mL | INTRAVENOUS | Status: DC
  Administered 2013-10-21: 1000 mL via INTRAVENOUS

## 2013-10-21 MED ORDER — METOCLOPRAMIDE HCL 5 MG/ML IJ SOLN
10.0000 mg | Freq: Once | INTRAMUSCULAR | Status: AC
  Administered 2013-10-21: 10 mg via INTRAVENOUS
  Filled 2013-10-21: qty 2

## 2013-10-21 MED ORDER — SODIUM CHLORIDE 0.9 % IV SOLN
INTRAVENOUS | Status: DC
  Administered 2013-10-21: 15:00:00 via INTRAVENOUS

## 2013-10-21 MED ORDER — ONDANSETRON HCL 4 MG PO TABS: 4.0000 mg | ORAL_TABLET | Freq: Four times a day (QID) | ORAL | Status: DC | PRN

## 2013-10-21 NOTE — ED Notes (Signed)
Per EMS: pt from [facility], pt c/o abd pain, n/v x 2 days. Pt actively vomiting. Pt has ran out of insulin - CBG 397. 20 g in right wrist - 4 mg Zofran, 300 ml NS.

## 2013-10-21 NOTE — ED Notes (Signed)
Initial contact-A&O x4. Resting comfortably. Awaiting insulin gtt from pharmacy. No other complaints at this time. In NAD.

## 2013-10-21 NOTE — H&P (Signed)
[facility]          History and Physical    PCP:   Elbert Ewings, FNP   Chief Complaint:  Abdominal pain, nausea, vomiting  HPI: Patient is a 43 year old female well-known to our hospital for multiple admissions for DKA and diabetic gastroparesis. She was last discharged from the hospital on April 30 for the same. She returns today with abdominal pain, nausea, vomiting. She states she ran out of her insulin 2 days ago. She was found to be in DKA with a bicarbonate of 19 and an ion gap of 21. She has also been unable to tolerate any by mouth's in the emergency department secondary to vomiting. Hospitalist admission has been requested.  Allergies:   Allergies  Allergen Reactions  . Penicillins Rash      Past Medical History  Diagnosis Date  . Kidney stone   . Seizure disorder     started with pregnancy of first son  . Gastritis   . Bipolar 2 disorder   . Post traumatic stress disorder     "flipping out" after people close to her died  . Heart murmur   . Arthritis     r ankle-S/P surgery (2007)  . Anemia     childhood  . Depression     childhood  . Hepatitis C     biopsy in 2010-no rx-was supposed to see a hepatologist in [place].  With cirrhosis  . Neuropathy     in legs and feet and hands  . Cataracts, bilateral   . Scoliosis   . Diabetes mellitus     diagnosed in 1996-always been on insulin  . DKA (diabetic ketoacidoses)     Recurrent admissions for DKA, medication non-complaince, poor social situation  . Diabetic neuropathy, type I diabetes mellitus     numbness bilaterally feet  . HTN (hypertension) 10/12/2013    Past Surgical History  Procedure Laterality Date  . Ankle surgery  2007  . Endometrial biopsy  06/22/2012  . Cholecystectomy  04/07/2013  . Cholecystectomy N/A 04/07/2013    Procedure: LAPAROSCOPIC CHOLECYSTECTOMY WITH INTRAOPERATIVE CHOLANGIOGRAM;  Surgeon: Adin Hector, MD;  Location: [facility];  Service: General;   Laterality: N/A;  . Cesarean section      3 times    Prior to Admission medications   Medication Sig Start Date End Date Taking? Authorizing Provider  aspirin EC 81 MG tablet Take 81 mg by mouth daily.     Historical Provider, MD  diphenhydrAMINE (BENADRYL) 25 MG tablet Take 25 mg by mouth every 4 (four) hours as needed for itching. Take with reglan as needed 04/21/13   Kalman Drape, MD  gabapentin (NEURONTIN) 400 MG capsule Take 600 mg by mouth 3 (three) times daily.     Historical Provider, MD  hydrochlorothiazide (MICROZIDE) 12.5 MG capsule Take 12.5 mg by mouth daily.    Historical Provider, MD  hyoscyamine (LEVSIN SL) 0.125 MG SL tablet Place 1 tablet (0.125 mg total) under the tongue 3 (three) times daily. 10/14/13   Theodis Blaze, MD  insulin NPH-regular Human (NOVOLIN 70/30) (70-30) 100 UNIT/ML injection Inject 35 Units into the skin 2 (two) times daily with a meal.    Historical Provider, MD  medroxyPROGESTERone (PROVERA) 10 MG tablet Take 2 tablets (20 mg total) by mouth daily. 04/15/13   Woodroe Mode, MD  meloxicam (MOBIC) 15 MG tablet Take 15 mg by mouth  daily.     Historical Provider, MD  metFORMIN (GLUCOPHAGE) 1000 MG tablet Take 1,000 mg by mouth 2 (two) times daily.    Historical Provider, MD  metoCLOPramide (REGLAN) 10 MG tablet Take 1 tablet (10 mg total) by mouth 4 (four) times daily -  before meals and at bedtime. 04/21/13   Olivia Mackie, MD  ondansetron (ZOFRAN) 4 MG tablet Take 1 tablet (4 mg total) by mouth every 6 (six) hours. 10/14/13   Dorothea Ogle, MD  oxyCODONE-acetaminophen (PERCOCET/ROXICET) 5-325 MG per tablet Take 1 tablet by mouth every 8 (eight) hours as needed for severe pain. 10/14/13   Dorothea Ogle, MD  risperidone (RISPERDAL) 4 MG tablet Take 2 mg by mouth at bedtime.     Historical Provider, MD  sitaGLIPtin (JANUVIA) 100 MG tablet Take 100 mg by mouth daily.    Historical Provider, MD    Social History:  reports that she has been smoking Cigarettes.  She  has a 2.5 pack-year smoking history. She has never used smokeless tobacco. She reports that she uses illicit drugs (Marijuana) about once per week. She reports that she does not drink alcohol.  Family History  Problem Relation Age of Onset  . Diabetes Mother     currently 59  . Fibromyalgia Mother   . Cirrhosis Father     died in 15  . Diabetes Maternal Grandmother   . Diabetes Maternal Aunt     Review of Systems:  Constitutional: Denies fever, chills, diaphoresis, appetite change and fatigue.  HEENT: Denies photophobia, eye pain, redness, hearing loss, ear pain, congestion, sore throat, rhinorrhea, sneezing, mouth sores, trouble swallowing, neck pain, neck stiffness and tinnitus.   Respiratory: Denies SOB, DOE, cough, chest tightness,  and wheezing.   Cardiovascular: Denies chest pain, palpitations and leg swelling.  Gastrointestinal: Denies diarrhea, constipation, blood in stool and abdominal distention.  Genitourinary: Denies dysuria, urgency, frequency, hematuria, flank pain and difficulty urinating.  Endocrine: Denies: hot or cold intolerance, sweats, changes in hair or nails, polyuria, polydipsia. Musculoskeletal: Denies myalgias, back pain, joint swelling, arthralgias and gait problem.  Skin: Denies pallor, rash and wound.  Neurological: Denies dizziness, seizures, syncope, weakness, light-headedness, numbness and headaches.  Hematological: Denies adenopathy. Easy bruising, personal or family bleeding history  Psychiatric/Behavioral: Denies suicidal ideation, mood changes, confusion, nervousness, sleep disturbance and agitation   Physical Exam: Blood pressure 160/110, pulse 110, temperature 97.8 F (36.6 C), temperature source Oral, resp. rate 24, height 5\' 4"  (1.626 m), SpO2 100.00%. General: Alert, awake, oriented x3. HEENT: Normocephalic, atraumatic, pupils equal and reactive to light, extraocular movements intact, dry mucous membranes, poor dentition and halitosis. Neck:  Supple, no JVD, no lymphadenopathy, no bruits, no goiter. Cardiovascular: Regular rate and rhythm, no murmurs, rubs or gallops. Lungs: Clear to auscultation bilaterally. Abdomen: Soft, nondistended, positive bowel sounds, no masses or organomegaly noted. Extremities: No clubbing, cyanosis or edema, positive pulses. Neurologic: Intact and nonfocal.  Labs on Admission:  Results for orders placed during the hospital encounter of 10/21/13 (from the past 48 hour(s))  URINALYSIS, ROUTINE W REFLEX MICROSCOPIC     Status: Abnormal   Collection Time    10/21/13  9:25 AM      Result Value Ref Range   Color, Urine YELLOW  YELLOW   APPearance CLOUDY (*) CLEAR   Specific Gravity, Urine 1.035 (*) 1.005 - 1.030   pH 5.5  5.0 - 8.0   Glucose, UA >1000 (*) NEGATIVE mg/dL   Hgb urine dipstick LARGE (*)  NEGATIVE   Bilirubin Urine NEGATIVE  NEGATIVE   Ketones, ur 40 (*) NEGATIVE mg/dL   Protein, ur NEGATIVE  NEGATIVE mg/dL   Urobilinogen, UA 0.2  0.0 - 1.0 mg/dL   Nitrite NEGATIVE  NEGATIVE   Leukocytes, UA NEGATIVE  NEGATIVE  URINE MICROSCOPIC-ADD ON     Status: None   Collection Time    10/21/13  9:25 AM      Result Value Ref Range   Squamous Epithelial / LPF RARE  RARE   WBC, UA 0-2  <3 WBC/hpf  CBC WITH DIFFERENTIAL     Status: Abnormal   Collection Time    10/21/13  9:35 AM      Result Value Ref Range   WBC 13.3 (*) 4.0 - 10.5 K/uL   RBC 5.45 (*) 3.87 - 5.11 MIL/uL   Hemoglobin 12.6  12.0 - 15.0 g/dL   HCT 38.9  36.0 - 46.0 %   MCV 71.4 (*) 78.0 - 100.0 fL   MCH 23.1 (*) 26.0 - 34.0 pg   MCHC 32.4  30.0 - 36.0 g/dL   RDW 14.3  11.5 - 15.5 %   Platelets 216  150 - 400 K/uL   Neutrophils Relative % 65  43 - 77 %   Lymphocytes Relative 30  12 - 46 %   Monocytes Relative 4  3 - 12 %   Eosinophils Relative 1  0 - 5 %   Basophils Relative 0  0 - 1 %   Neutro Abs 8.7 (*) 1.7 - 7.7 K/uL   Lymphs Abs 4.0  0.7 - 4.0 K/uL   Monocytes Absolute 0.5  0.1 - 1.0 K/uL   Eosinophils Absolute 0.1   0.0 - 0.7 K/uL   Basophils Absolute 0.0  0.0 - 0.1 K/uL   Smear Review MORPHOLOGY UNREMARKABLE    COMPREHENSIVE METABOLIC PANEL     Status: Abnormal   Collection Time    10/21/13  9:35 AM      Result Value Ref Range   Sodium 137  137 - 147 mEq/L   Comment: REPEATED TO VERIFY   Potassium 5.3  3.7 - 5.3 mEq/L   Comment: MODERATE HEMOLYSIS     HEMOLYSIS AT THIS LEVEL MAY AFFECT RESULT   Chloride 97  96 - 112 mEq/L   Comment: REPEATED TO VERIFY   CO2 19  19 - 32 mEq/L   Comment: REPEATED TO VERIFY   Glucose, Bld 423 (*) 70 - 99 mg/dL   BUN 16  6 - 23 mg/dL   Creatinine, Ser 0.46 (*) 0.50 - 1.10 mg/dL   Calcium 9.4  8.4 - 10.5 mg/dL   Total Protein 8.1  6.0 - 8.3 g/dL   Albumin 4.0  3.5 - 5.2 g/dL   AST 87 (*) 0 - 37 U/L   Comment: MODERATE HEMOLYSIS     HEMOLYSIS AT THIS LEVEL MAY AFFECT RESULT   ALT 73 (*) 0 - 35 U/L   Comment: MODERATE HEMOLYSIS     HEMOLYSIS AT THIS LEVEL MAY AFFECT RESULT   Alkaline Phosphatase 102  39 - 117 U/L   Total Bilirubin 0.3  0.3 - 1.2 mg/dL   GFR calc non Af Amer >90  >90 mL/min   GFR calc Af Amer >90  >90 mL/min   Comment: (NOTE)     The eGFR has been calculated using the CKD EPI equation.     This calculation has not been validated in all clinical situations.     eGFR's  persistently <90 mL/min signify possible Chronic Kidney     Disease.  LIPASE, BLOOD     Status: None   Collection Time    10/21/13  9:35 AM      Result Value Ref Range   Lipase 39  11 - 59 U/L  TROPONIN I     Status: None   Collection Time    10/21/13  9:35 AM      Result Value Ref Range   Troponin I <0.30  <0.30 ng/mL   Comment:            Due to the release kinetics of cTnI,     a negative result within the first hours     of the onset of symptoms does not rule out     myocardial infarction with certainty.     If myocardial infarction is still suspected,     repeat the test at appropriate intervals.  CBG MONITORING, ED     Status: Abnormal   Collection Time     10/21/13 11:51 AM      Result Value Ref Range   Glucose-Capillary 360 (*) 70 - 99 mg/dL  CBG MONITORING, ED     Status: Abnormal   Collection Time    10/21/13  1:01 PM      Result Value Ref Range   Glucose-Capillary 330 (*) 70 - 99 mg/dL    Radiological Exams on Admission: Dg Abd Acute W/chest  10/21/2013   CLINICAL DATA:  43 year old female mid abdominal pain nausea and vomiting. Initial encounter. Hepatitis-C.  EXAM: ACUTE ABDOMEN SERIES (ABDOMEN 2 VIEW & CHEST 1 VIEW)  COMPARISON:  10/12/2013 and earlier.  FINDINGS: AP upright view of the chest. Mildly lower lung volumes. No pneumothorax or pneumoperitoneum. Stable mild attenuation bronchovascular markings. Normal cardiac size and mediastinal contours. Visualized tracheal air column is within normal limits.  No pneumoperitoneum. Non obstructed bowel gas pattern. Increased retained stool in the colon. Stable scoliosis. Stable right upper quadrant surgical clips. No acute osseous abnormality identified.  IMPRESSION: 1.  Non obstructed bowel gas pattern, no free air. 2.  No acute cardiopulmonary abnormality.   Electronically Signed   By: Lars Pinks M.D.   On: 10/21/2013 10:28    Assessment/Plan Principal Problem:   DKA (diabetic ketoacidoses) Active Problems:   Non compliance w medication regimen   Bipolar disorder   Abdominal pain   DM (diabetes mellitus), type 1, uncontrolled   Leukocytosis, unspecified   Nausea with vomiting   Gastroparesis due to DM    Diabetic ketoacidosis -Due to medication noncompliance and lack of medications. -Place on IV insulin as per DKA protocol. -IV fluids, check BMET every 4 hours to determine when ready to transition off drip.  Abdominal pain, nausea, vomiting -Secondary to diabetic gastroparesis. -Reglan. -When necessary IV morphine.  Leukocytosis -Likely related to DKA, no signs of active infection.  Bipolar disorder -Continue psychotropic medications once able to take by mouth's.  DVT  prophylaxis -Lovenox  CODE STATUS -Full code  Time Spent on Admission: 80 minutes  [facility] Pager: [phone] 10/21/2013, 2:36 PM

## 2013-10-21 NOTE — Progress Notes (Addendum)
Went to update information in [employer] for insulin gtt but information about insulin glucostablizer is erased. New drip setup on glucostablilizer now has to be started. All information about patient drip history is gone. Will continue to monitor. Charge Nurse Jerene Pitch made aware.

## 2013-10-21 NOTE — ED Provider Notes (Signed)
CSN: [id number]     Arrival date & time 10/21/13  0904 History   First MD Initiated Contact with Patient 10/21/13 [phone]     Chief Complaint  Patient presents with  . Abdominal Pain  . Nausea  . Vomiting     (Consider location/radiation/quality/duration/timing/severity/associated sxs/prior Treatment) Patient is a 43 y.o. female presenting with abdominal pain. The history is provided by the patient.  Abdominal Pain Associated symptoms: nausea and vomiting   Associated symptoms: no chest pain, no diarrhea and no shortness of breath    patient presents with nausea vomiting and upper abdominal pain. She's history of multiple episodes similar symptoms. She history gastroparesis. No fevers. She was discharged from the hospital 7 days ago after similar symptoms. She states she's been doing well until 2 days ago. No fevers. No diarrhea.  Past Medical History  Diagnosis Date  . Kidney stone   . Seizure disorder     started with pregnancy of first son  . Gastritis   . Bipolar 2 disorder   . Post traumatic stress disorder     "flipping out" after people close to her died  . Heart murmur   . Arthritis     r ankle-S/P surgery (2007)  . Anemia     childhood  . Depression     childhood  . Hepatitis C     biopsy in 2010-no rx-was supposed to see a hepatologist in [place].  With cirrhosis  . Neuropathy     in legs and feet and hands  . Cataracts, bilateral   . Scoliosis   . Diabetes mellitus     diagnosed in 1996-always been on insulin  . DKA (diabetic ketoacidoses)     Recurrent admissions for DKA, medication non-complaince, poor social situation  . Diabetic neuropathy, type I diabetes mellitus     numbness bilaterally feet  . HTN (hypertension) 10/12/2013   Past Surgical History  Procedure Laterality Date  . Ankle surgery  2007  . Endometrial biopsy  06/22/2012  . Cholecystectomy  04/07/2013  . Cholecystectomy N/A 04/07/2013    Procedure: LAPAROSCOPIC CHOLECYSTECTOMY WITH  INTRAOPERATIVE CHOLANGIOGRAM;  Surgeon: Adin Hector, MD;  Location: [facility];  Service: General;  Laterality: N/A;  . Cesarean section      3 times   Family History  Problem Relation Age of Onset  . Diabetes Mother     currently 57  . Fibromyalgia Mother   . Cirrhosis Father     died in 62  . Diabetes Maternal Grandmother   . Diabetes Maternal Aunt    History  Substance Use Topics  . Smoking status: Current Every Day Smoker -- 0.10 packs/day for 25 years    Types: Cigarettes  . Smokeless tobacco: Never Used  . Alcohol Use: No     Comment: Endorses hasn't been drinking   OB History   Grav Para Term Preterm Abortions TAB SAB Ect Mult Living   4 3 3  0 1 0 1 0 1 2     Review of Systems  Constitutional: Negative for activity change and appetite change.  Eyes: Negative for pain.  Respiratory: Negative for chest tightness and shortness of breath.   Cardiovascular: Negative for chest pain and leg swelling.  Gastrointestinal: Positive for nausea, vomiting and abdominal pain. Negative for diarrhea.  Genitourinary: Negative for flank pain.  Musculoskeletal: Negative for back pain and neck stiffness.  Skin: Negative for rash.  Neurological: Negative for weakness, numbness and headaches.  Psychiatric/Behavioral: Negative for behavioral problems.  Allergies  Penicillins  Home Medications   Prior to Admission medications   Medication Sig Start Date End Date Taking? Authorizing Provider  aspirin EC 81 MG tablet Take 81 mg by mouth daily.     Historical Provider, MD  diphenhydrAMINE (BENADRYL) 25 MG tablet Take 25 mg by mouth every 4 (four) hours as needed for itching. Take with reglan as needed 04/21/13   Kalman Drape, MD  gabapentin (NEURONTIN) 400 MG capsule Take 600 mg by mouth 3 (three) times daily.     Historical Provider, MD  hydrochlorothiazide (MICROZIDE) 12.5 MG capsule Take 12.5 mg by mouth daily.    Historical Provider, MD  hyoscyamine (LEVSIN SL) 0.125 MG SL  tablet Place 1 tablet (0.125 mg total) under the tongue 3 (three) times daily. 10/14/13   Theodis Blaze, MD  insulin NPH-regular Human (NOVOLIN 70/30) (70-30) 100 UNIT/ML injection Inject 35 Units into the skin 2 (two) times daily with a meal.    Historical Provider, MD  medroxyPROGESTERone (PROVERA) 10 MG tablet Take 2 tablets (20 mg total) by mouth daily. 04/15/13   Woodroe Mode, MD  meloxicam (MOBIC) 15 MG tablet Take 15 mg by mouth daily.     Historical Provider, MD  metFORMIN (GLUCOPHAGE) 1000 MG tablet Take 1,000 mg by mouth 2 (two) times daily.    Historical Provider, MD  metoCLOPramide (REGLAN) 10 MG tablet Take 1 tablet (10 mg total) by mouth 4 (four) times daily -  before meals and at bedtime. 04/21/13   Kalman Drape, MD  ondansetron (ZOFRAN) 4 MG tablet Take 1 tablet (4 mg total) by mouth every 6 (six) hours. 10/14/13   Theodis Blaze, MD  oxyCODONE-acetaminophen (PERCOCET/ROXICET) 5-325 MG per tablet Take 1 tablet by mouth every 8 (eight) hours as needed for severe pain. 10/14/13   Theodis Blaze, MD  risperidone (RISPERDAL) 4 MG tablet Take 2 mg by mouth at bedtime.     Historical Provider, MD  sitaGLIPtin (JANUVIA) 100 MG tablet Take 100 mg by mouth daily.    Historical Provider, MD   BP 121/75  Pulse 88  Temp(Src) 98.6 F (37 C) (Oral)  Resp 17  Ht 5\' 7"  (1.702 m)  Wt 123 lb 10.9 oz (56.1 kg)  BMI 19.37 kg/m2  SpO2 99% Physical Exam  Nursing note and vitals reviewed. Constitutional: She is oriented to person, place, and time. She appears well-developed and well-nourished.  HENT:  Head: Normocephalic and atraumatic.  Eyes: EOM are normal. Pupils are equal, round, and reactive to light.  Neck: Normal range of motion. Neck supple.  Cardiovascular: Regular rhythm and normal heart sounds.   No murmur heard. Tachycardia  Pulmonary/Chest: Effort normal and breath sounds normal. No respiratory distress. She has no wheezes. She has no rales.  Abdominal: Soft. She exhibits no  distension. There is tenderness. There is no rebound and no guarding.  Upper abdominal tenderness without rebound guarding or mass.  Musculoskeletal: Normal range of motion.  Neurological: She is alert and oriented to person, place, and time. No cranial nerve deficit.  Skin: Skin is warm and dry.  Psychiatric: She has a normal mood and affect. Her speech is normal.    ED Course  Procedures (including critical care time) Labs Review Labs Reviewed  CBC WITH DIFFERENTIAL - Abnormal; Notable for the following:    WBC 13.3 (*)    RBC 5.45 (*)    MCV 71.4 (*)    MCH 23.1 (*)    Neutro Abs  8.7 (*)    All other components within normal limits  COMPREHENSIVE METABOLIC PANEL - Abnormal; Notable for the following:    Glucose, Bld 423 (*)    Creatinine, Ser 0.46 (*)    AST 87 (*)    ALT 73 (*)    All other components within normal limits  URINALYSIS, ROUTINE W REFLEX MICROSCOPIC - Abnormal; Notable for the following:    APPearance CLOUDY (*)    Specific Gravity, Urine 1.035 (*)    Glucose, UA >1000 (*)    Hgb urine dipstick LARGE (*)    Ketones, ur 40 (*)    All other components within normal limits  BASIC METABOLIC PANEL - Abnormal; Notable for the following:    Glucose, Bld 245 (*)    All other components within normal limits  BASIC METABOLIC PANEL - Abnormal; Notable for the following:    Glucose, Bld 234 (*)    All other components within normal limits  BASIC METABOLIC PANEL - Abnormal; Notable for the following:    Glucose, Bld 194 (*)    Creatinine, Ser 0.48 (*)    All other components within normal limits  BASIC METABOLIC PANEL - Abnormal; Notable for the following:    Glucose, Bld 147 (*)    Creatinine, Ser 0.46 (*)    All other components within normal limits  GLUCOSE, CAPILLARY - Abnormal; Notable for the following:    Glucose-Capillary 211 (*)    All other components within normal limits  GLUCOSE, CAPILLARY - Abnormal; Notable for the following:    Glucose-Capillary  179 (*)    All other components within normal limits  CBC - Abnormal; Notable for the following:    WBC 16.8 (*)    Hemoglobin 11.0 (*)    HCT 34.0 (*)    MCV 70.4 (*)    MCH 22.8 (*)    All other components within normal limits  GLUCOSE, CAPILLARY - Abnormal; Notable for the following:    Glucose-Capillary 235 (*)    All other components within normal limits  BASIC METABOLIC PANEL - Abnormal; Notable for the following:    Potassium 3.6 (*)    Glucose, Bld 110 (*)    Creatinine, Ser 0.49 (*)    All other components within normal limits  GLUCOSE, CAPILLARY - Abnormal; Notable for the following:    Glucose-Capillary 230 (*)    All other components within normal limits  BASIC METABOLIC PANEL - Abnormal; Notable for the following:    Glucose, Bld 187 (*)    Creatinine, Ser 0.48 (*)    All other components within normal limits  GLUCOSE, CAPILLARY - Abnormal; Notable for the following:    Glucose-Capillary 167 (*)    All other components within normal limits  GLUCOSE, CAPILLARY - Abnormal; Notable for the following:    Glucose-Capillary 134 (*)    All other components within normal limits  GLUCOSE, CAPILLARY - Abnormal; Notable for the following:    Glucose-Capillary 162 (*)    All other components within normal limits  GLUCOSE, CAPILLARY - Abnormal; Notable for the following:    Glucose-Capillary 185 (*)    All other components within normal limits  GLUCOSE, CAPILLARY - Abnormal; Notable for the following:    Glucose-Capillary 198 (*)    All other components within normal limits  GLUCOSE, CAPILLARY - Abnormal; Notable for the following:    Glucose-Capillary 159 (*)    All other components within normal limits  GLUCOSE, CAPILLARY - Abnormal; Notable for the following:  Glucose-Capillary 149 (*)    All other components within normal limits  GLUCOSE, CAPILLARY - Abnormal; Notable for the following:    Glucose-Capillary 151 (*)    All other components within normal limits   GLUCOSE, CAPILLARY - Abnormal; Notable for the following:    Glucose-Capillary 153 (*)    All other components within normal limits  GLUCOSE, CAPILLARY - Abnormal; Notable for the following:    Glucose-Capillary 190 (*)    All other components within normal limits  GLUCOSE, CAPILLARY - Abnormal; Notable for the following:    Glucose-Capillary 132 (*)    All other components within normal limits  GLUCOSE, CAPILLARY - Abnormal; Notable for the following:    Glucose-Capillary 110 (*)    All other components within normal limits  CBG MONITORING, ED - Abnormal; Notable for the following:    Glucose-Capillary 360 (*)    All other components within normal limits  CBG MONITORING, ED - Abnormal; Notable for the following:    Glucose-Capillary 330 (*)    All other components within normal limits  LIPASE, BLOOD  TROPONIN I  URINE MICROSCOPIC-ADD ON    Imaging Review Dg Abd Acute W/chest  10/21/2013   CLINICAL DATA:  43 year old female mid abdominal pain nausea and vomiting. Initial encounter. Hepatitis-C.  EXAM: ACUTE ABDOMEN SERIES (ABDOMEN 2 VIEW & CHEST 1 VIEW)  COMPARISON:  10/12/2013 and earlier.  FINDINGS: AP upright view of the chest. Mildly lower lung volumes. No pneumothorax or pneumoperitoneum. Stable mild attenuation bronchovascular markings. Normal cardiac size and mediastinal contours. Visualized tracheal air column is within normal limits.  No pneumoperitoneum. Non obstructed bowel gas pattern. Increased retained stool in the colon. Stable scoliosis. Stable right upper quadrant surgical clips. No acute osseous abnormality identified.  IMPRESSION: 1.  Non obstructed bowel gas pattern, no free air. 2.  No acute cardiopulmonary abnormality.   Electronically Signed   By: Lars Pinks M.D.   On: 10/21/2013 10:28     EKG Interpretation None      MDM   Final diagnoses:  Gastroparesis due to DM  DKA (diabetic ketoacidoses)    Patient with nausea vomiting and abdominal pain. His  history gastroparesis. Has a mild DKA. Will admit to internal medicine. Previous admissions for same    Jasper Riling. Alvino Chapel, MD 10/22/13 [phone]

## 2013-10-21 NOTE — Progress Notes (Signed)
Pt refuses to allow pharmacy to do medication reconcilation. Another pharmacy tech has been to talk to patient x 2 and she refused. Currently she is laying in bed screaming and kicking legs up and down in bed. Refuses to answer any questions. Spoke with pt's rn and she states pt is not confused. Explained to pt that I will leave note for dr and let dr know that she does not want to answer any questions about what medications she is on and when she last took them. Lab tech was also in room at the time I was trying to do med reconcilation. Pt refused to roll over on her back and stretch her arm out for lab tech to be able to draw blood easily.  Lucius Conn BSN, RN-BC Admissions RN  10/21/2013 3:46 PM

## 2013-10-21 NOTE — ED Notes (Signed)
Bed: WA06 Expected date:  Expected time:  Means of arrival:  Comments: EMS abd pain n/v

## 2013-10-22 DIAGNOSIS — E1110 Type 2 diabetes mellitus with ketoacidosis without coma: Secondary | ICD-10-CM | POA: Diagnosis not present

## 2013-10-22 DIAGNOSIS — R109 Unspecified abdominal pain: Secondary | ICD-10-CM | POA: Diagnosis not present

## 2013-10-22 DIAGNOSIS — E1149 Type 2 diabetes mellitus with other diabetic neurological complication: Secondary | ICD-10-CM | POA: Diagnosis not present

## 2013-10-22 DIAGNOSIS — F319 Bipolar disorder, unspecified: Secondary | ICD-10-CM

## 2013-10-22 DIAGNOSIS — F3110 Bipolar disorder, current episode manic without psychotic features, unspecified: Secondary | ICD-10-CM

## 2013-10-22 LAB — GLUCOSE, CAPILLARY
GLUCOSE-CAPILLARY: 149 mg/dL — AB (ref 70–99)
GLUCOSE-CAPILLARY: 190 mg/dL — AB (ref 70–99)
GLUCOSE-CAPILLARY: 132 mg/dL — AB (ref 70–99)
GLUCOSE-CAPILLARY: 110 mg/dL — AB (ref 70–99)
Glucose-Capillary: 151 mg/dL — ABNORMAL HIGH (ref 70–99)
Glucose-Capillary: 153 mg/dL — ABNORMAL HIGH (ref 70–99)
Glucose-Capillary: 159 mg/dL — ABNORMAL HIGH (ref 70–99)
Glucose-Capillary: 140 mg/dL — ABNORMAL HIGH (ref 70–99)
Glucose-Capillary: 149 mg/dL — ABNORMAL HIGH (ref 70–99)
Glucose-Capillary: 215 mg/dL — ABNORMAL HIGH (ref 70–99)

## 2013-10-22 LAB — CBC
HCT: 34 % — ABNORMAL LOW (ref 36.0–46.0)
Hemoglobin: 11 g/dL — ABNORMAL LOW (ref 12.0–15.0)
MCH: 22.8 pg — AB (ref 26.0–34.0)
MCHC: 32.4 g/dL (ref 30.0–36.0)
MCV: 70.4 fL — AB (ref 78.0–100.0)
Platelets: 242 10*3/uL (ref 150–400)
RBC: 4.83 MIL/uL (ref 3.87–5.11)
RDW: 14.1 % (ref 11.5–15.5)
WBC: 16.8 10*3/uL — ABNORMAL HIGH (ref 4.0–10.5)

## 2013-10-22 LAB — BASIC METABOLIC PANEL
BUN: 12 mg/dL (ref 6–23)
BUN: 12 mg/dL (ref 6–23)
BUN: 11 mg/dL (ref 6–23)
CHLORIDE: 104 meq/L (ref 96–112)
CO2: 23 meq/L (ref 19–32)
CO2: 23 meq/L (ref 19–32)
CO2: 22 meq/L (ref 19–32)
CREATININE: 0.46 mg/dL — AB (ref 0.50–1.10)
Calcium: 9 mg/dL (ref 8.4–10.5)
Calcium: 9 mg/dL (ref 8.4–10.5)
Calcium: 8.9 mg/dL (ref 8.4–10.5)
Chloride: 106 mEq/L (ref 96–112)
Chloride: 107 mEq/L (ref 96–112)
Creatinine, Ser: 0.49 mg/dL — ABNORMAL LOW (ref 0.50–1.10)
Creatinine, Ser: 0.48 mg/dL — ABNORMAL LOW (ref 0.50–1.10)
GFR calc Af Amer: >90 mL/min (ref >90)
GFR calc Af Amer: >90 mL/min (ref >90)
GFR calc non Af Amer: >90 mL/min (ref >90)
GFR calc non Af Amer: >90 mL/min (ref >90)
GLUCOSE: 110 mg/dL — AB (ref 70–99)
GLUCOSE: 187 mg/dL — AB (ref 70–99)
Glucose, Bld: 147 mg/dL — ABNORMAL HIGH (ref 70–99)
POTASSIUM: 3.6 meq/L — AB (ref 3.7–5.3)
POTASSIUM: 3.7 meq/L (ref 3.7–5.3)
Potassium: 3.7 mEq/L (ref 3.7–5.3)
SODIUM: 141 meq/L (ref 137–147)
SODIUM: 138 meq/L (ref 137–147)
Sodium: 142 mEq/L (ref 137–147)

## 2013-10-22 MED ORDER — SODIUM CHLORIDE 0.9 % IV SOLN
INTRAVENOUS | Status: DC
  Administered 2013-10-22 – 2013-10-23 (×3): via INTRAVENOUS

## 2013-10-22 MED ORDER — POTASSIUM CHLORIDE CRYS ER 20 MEQ PO TBCR: 40.0000 meq | EXTENDED_RELEASE_TABLET | Freq: Once | ORAL | Status: DC

## 2013-10-22 MED ORDER — DIVALPROEX SODIUM 250 MG PO DR TAB
250.0000 mg | DELAYED_RELEASE_TABLET | Freq: Two times a day (BID) | ORAL | Status: DC
  Administered 2013-10-22 – 2013-10-23 (×3): 250 mg via ORAL
  Filled 2013-10-22 (×4): qty 1

## 2013-10-22 MED ORDER — INSULIN ASPART PROT & ASPART (70-30 MIX) 100 UNIT/ML ~~LOC~~ SUSP
20.0000 [IU] | Freq: Two times a day (BID) | SUBCUTANEOUS | Status: DC
  Administered 2013-10-22 – 2013-10-23 (×4): 20 [IU] via SUBCUTANEOUS
  Filled 2013-10-22: qty 10

## 2013-10-22 MED ORDER — INSULIN ASPART 100 UNIT/ML ~~LOC~~ SOLN
0.0000 [IU] | Freq: Every day | SUBCUTANEOUS | Status: DC
  Administered 2013-10-22: 2 [IU] via SUBCUTANEOUS

## 2013-10-22 MED ORDER — ENOXAPARIN SODIUM 40 MG/0.4ML ~~LOC~~ SOLN
40.0000 mg | SUBCUTANEOUS | Status: DC
  Administered 2013-10-22: 40 mg via SUBCUTANEOUS
  Filled 2013-10-22 (×2): qty 0.4

## 2013-10-22 MED ORDER — POTASSIUM CHLORIDE CRYS ER 20 MEQ PO TBCR
40.0000 meq | EXTENDED_RELEASE_TABLET | Freq: Once | ORAL | Status: AC
  Administered 2013-10-22: 40 meq via ORAL
  Filled 2013-10-22: qty 2

## 2013-10-22 MED ORDER — INSULIN ASPART 100 UNIT/ML ~~LOC~~ SOLN
0.0000 [IU] | Freq: Three times a day (TID) | SUBCUTANEOUS | Status: DC
  Administered 2013-10-22 (×2): 1 [IU] via SUBCUTANEOUS
  Administered 2013-10-23: 3 [IU] via SUBCUTANEOUS
  Administered 2013-10-23: 2 [IU] via SUBCUTANEOUS

## 2013-10-22 MED ORDER — RISPERIDONE 1 MG PO TABS
1.0000 mg | ORAL_TABLET | Freq: Every day | ORAL | Status: DC
  Administered 2013-10-22: 1 mg via ORAL
  Filled 2013-10-22 (×2): qty 1

## 2013-10-22 NOTE — Plan of Care (Signed)
Problem: Phase II Progression Outcomes Goal: Tolerating PO clear liquid diet Outcome: Not Progressing Has not ordered breakfast.

## 2013-10-22 NOTE — Progress Notes (Signed)
CSW received referral for homeless issues.  CSW attempted to meet with pt at bedside. Pt sleeping at this time.  CSW spoke with RN who reports that pt had just had an episode of vomiting.  Per chart, pt has been living at [facility] for [facility] the [employer].  CSW to follow up with pt and complete full psychosocial assessment at that time.  Alison Murray, MSW, [place] [facility] [phone]

## 2013-10-22 NOTE — Progress Notes (Signed)
[facility] PROGRESS NOTE  KIMYA MCCAHILL RXV:[id number] DOB: [birth year] DOA: 10/21/2013 PCP: Elbert Ewings, FNP  Assessment/Plan: DKA -Resolved.  DM -CBGs currently well controlled on current regimen of 70/30. -Continue to monitor and adjust as needed.  Bipolar Disorder -Psych consultation requested. -Medications adjusted as per their recommendations.  Diabetic Gastroparesis -Continue Reglan and PRN zofran. -Only 1 episode of emesis so far today.  DVT Prophylaxis -Lovenox  Code Status: Full Code Family Communication: Patient only  Disposition Plan: DC likely in 24 hours   Consultants:  Psychiatry   Antibiotics:  None   Subjective: C/o continued abdominal pain. Very loud and yelling for no apparent reason.  Objective: Filed Vitals:   10/22/13 0800 10/22/13 1150 10/22/13 1220 10/22/13 1338  BP: 139/95 145/84  121/75  Pulse: 80 86  88  Temp: 98.6 F (37 C)  98.4 F (36.9 C) 98.6 F (37 C)  TempSrc: Oral  Oral Oral  Resp: 33 14  17  Height:      Weight:      SpO2: 99% 100%  99%    Intake/Output Summary (Last 24 hours) at 10/22/13 1443 Last data filed at 10/22/13 1200  Gross per 24 hour  Intake 2276.18 ml  Output    550 ml  Net 1726.18 ml   Filed Weights   10/22/13 0500  Weight: 56.1 kg (123 lb 10.9 oz)    Exam:   General:  AA Ox3  Cardiovascular: RRR  Respiratory: CTA B  Abdomen: S/NT/ND/+BS  Extremities: no C/C/E   Neurologic:  Non-focal  Data Reviewed: Basic Metabolic Panel:  Recent Labs Lab 10/21/13 1810 10/21/13 2144 10/22/13 0154 10/22/13 0625 10/22/13 1030  NA 142 140 141 142 138  K 4.7 3.7 3.7 3.6* 3.7  CL 103 103 106 107 104  CO2 23 21 23 23 22   GLUCOSE 234* 194* 147* 110* 187*  BUN 13 13 12 12 11   CREATININE 0.53 0.48* 0.46* 0.49* 0.48*  CALCIUM 9.2 9.3 9.0 9.0 8.9   Liver Function Tests:  Recent Labs Lab 10/21/13 0935  AST 87*  ALT 73*  ALKPHOS 102  BILITOT 0.3  PROT 8.1  ALBUMIN 4.0     Recent Labs Lab 10/21/13 0935  LIPASE 39   No results found for this basename: AMMONIA,  in the last 168 hours CBC:  Recent Labs Lab 10/21/13 0935 10/22/13 0154  WBC 13.3* 16.8*  NEUTROABS 8.7*  --   HGB 12.6 11.0*  HCT 38.9 34.0*  MCV 71.4* 70.4*  PLT 216 242   Cardiac Enzymes:  Recent Labs Lab 10/21/13 0935  TROPONINI <0.30   BNP (last 3 results) No results found for this basename: PROBNP,  in the last 8760 hours CBG:  Recent Labs Lab 10/22/13 0200 10/22/13 0259 10/22/13 0345 10/22/13 0447 10/22/13 0744  GLUCAP 151* 153* 190* 132* 110*    Recent Results (from the past 240 hour(s))  MRSA PCR SCREENING     Status: None   Collection Time    10/13/13 12:24 AM      Result Value Ref Range Status   MRSA by PCR NEGATIVE  NEGATIVE Final   Comment:            The GeneXpert MRSA Assay (FDA     approved for NASAL specimens     only), is one component of a     comprehensive MRSA colonization     surveillance program. It is not     intended to diagnose MRSA  infection nor to guide or     monitor treatment for     MRSA infections.     Studies: Dg Abd Acute W/chest  10/21/2013   CLINICAL DATA:  43 year old female mid abdominal pain nausea and vomiting. Initial encounter. Hepatitis-C.  EXAM: ACUTE ABDOMEN SERIES (ABDOMEN 2 VIEW & CHEST 1 VIEW)  COMPARISON:  10/12/2013 and earlier.  FINDINGS: AP upright view of the chest. Mildly lower lung volumes. No pneumothorax or pneumoperitoneum. Stable mild attenuation bronchovascular markings. Normal cardiac size and mediastinal contours. Visualized tracheal air column is within normal limits.  No pneumoperitoneum. Non obstructed bowel gas pattern. Increased retained stool in the colon. Stable scoliosis. Stable right upper quadrant surgical clips. No acute osseous abnormality identified.  IMPRESSION: 1.  Non obstructed bowel gas pattern, no free air. 2.  No acute cardiopulmonary abnormality.   Electronically Signed   By: Lars Pinks M.D.   On: 10/21/2013 10:28    Scheduled Meds: . divalproex  250 mg Oral Q12H  . insulin aspart  0-5 Units Subcutaneous QHS  . insulin aspart  0-9 Units Subcutaneous TID WC  . insulin aspart protamine- aspart  20 Units Subcutaneous BID  . metoCLOPramide (REGLAN) injection  10 mg Intravenous 4 times per day  . risperiDONE  1 mg Oral QHS   Continuous Infusions: . sodium chloride 100 mL/hr at 10/22/13 0400  . insulin (NOVOLIN-R) infusion Stopped (10/22/13 0500)    Principal Problem:   DKA (diabetic ketoacidoses) Active Problems:   Non compliance w medication regimen   Bipolar disorder   Abdominal pain   DM (diabetes mellitus), type 1, uncontrolled   Leukocytosis, unspecified   Nausea with vomiting   Gastroparesis due to DM    Time spent: 35 minutes. Greater than 50% of this time was spent in direct contact with the patient coordinating care.    [facility] Pager [phone]  If 7PM-7AM, please contact night-coverage at [email or web address], password [facility] 10/22/2013, 2:43 PM  LOS: 1 day

## 2013-10-22 NOTE — Progress Notes (Signed)
CARE MANAGEMENT NOTE 10/22/2013  Patient:  Mary [name], Mary [name]   Account Number:  [id number]  Date Initiated:  10/22/2013  Documentation initiated by:  DAVIS,RHONDA  Subjective/Objective Assessment:   pt readmitted due to dka, glucose greater than 1000, iv insulin and iv fluids, has been having nausea and vomiting unable to take po meds.     Action/Plan:   [facility] living [facility] [employer],will see if in contact with [facility] and how she is getting her diabetic meds.  also hx of bi-polar   Anticipated DC Date:  10/25/2013   Anticipated DC Plan:  HOME/SELF CARE  In-house referral  [facility]  Medication Assistance  [place] [facility]      [facility] Choice  NA   Choice offered to / List presented to:  NA   DME arranged  NA      DME agency  NA     [place] arranged  NA      [place] agency  NA   Status of service:  In process, will continue to follow Medicare Important Message given?  NA - LOS <3 / Initial given by admissions (If response is "NO", the following Medicare IM given date fields will be blank) Date Medicare IM given:   Date Additional Medicare IM given:    Discharge Disposition:    Per UR Regulation:  Reviewed for med. necessity/level of care/duration of stay  If discussed at [place] of Stay Meetings, dates discussed:    Comments:  [id number]/Rhonda Eldridge Dace, BSN, [place] [phone] Chart Reviewed for discharge and hospital needs. Discharge needs at time of review: None present will follow for needs. Review of patient progress due on [id number].

## 2013-10-22 NOTE — Progress Notes (Signed)
Report called to Kenny, RN.

## 2013-10-22 NOTE — Consult Note (Signed)
[facility] Face-to-Face Psychiatry Consult   Reason for Consult:  Bipolar disorder Referring Physician:  Dr Robina Ade is an 43 y.o. female. Total Time spent with patient: 20 minutes  Assessment: AXIS I:  Bipolar, Manic AXIS II:  Deferred AXIS III:   Past Medical History  Diagnosis Date  . Kidney stone   . Seizure disorder     started with pregnancy of first son  . Gastritis   . Bipolar 2 disorder   . Post traumatic stress disorder     "flipping out" after people close to her died  . Heart murmur   . Arthritis     r ankle-S/P surgery (2007)  . Anemia     childhood  . Depression     childhood  . Hepatitis C     biopsy in 2010-no rx-was supposed to see a hepatologist in [place].  With cirrhosis  . Neuropathy     in legs and feet and hands  . Cataracts, bilateral   . Scoliosis   . Diabetes mellitus     diagnosed in 1996-always been on insulin  . DKA (diabetic ketoacidoses)     Recurrent admissions for DKA, medication non-complaince, poor social situation  . Diabetic neuropathy, type I diabetes mellitus     numbness bilaterally feet  . HTN (hypertension) 10/12/2013   AXIS IV:  economic problems, housing problems, other psychosocial or environmental problems, problems related to social environment and problems with primary support group AXIS V:  41-50 serious symptoms  Plan:  Patient does not meet criteria for psychiatric inpatient admission. Medication management  Subjective:   Mary [name] is a 43 y.o. female patient admitted with abdominal pain nausea and vomiting.  HPI:  Patient seen chart reviewed.  Patient is 43 year old African American female who admitted to the medical floor because of abdominal pain nausea and vomiting.  Patient has multiple admissions for diabetic ketoacidosis and gastroparesis.  Consult was called because patient has bipolar disorder and she is not taking her medication.  Patient is very irritable and did not cooperate for  information.  She admitted that she has bipolar disorder but she is not taking medication for more than 6 months.  She was going to [facility] [place] and then she moved to other places and not taking her psychotropic medication.  She does not remember the dosage of the medication but admitted taking Risperdal and Depakote.  She also admitted severe mood swings, irritability, anger and poor sleep.  She denies any hallucinations, suicidal thoughts or homicidal thoughts. When this writer tried to ask more questions she gets very upset and refuse to talk.  She reported that I don't want to talk and I'm hurting.  I need my pain medication.  As per chart patient lives in a shelter.   Past Psychiatric History: Past Medical History  Diagnosis Date  . Kidney stone   . Seizure disorder     started with pregnancy of first son  . Gastritis   . Bipolar 2 disorder   . Post traumatic stress disorder     "flipping out" after people close to her died  . Heart murmur   . Arthritis     r ankle-S/P surgery (2007)  . Anemia     childhood  . Depression     childhood  . Hepatitis C     biopsy in 2010-no rx-was supposed to see a hepatologist in [place].  With cirrhosis  . Neuropathy  in legs and feet and hands  . Cataracts, bilateral   . Scoliosis   . Diabetes mellitus     diagnosed in 1996-always been on insulin  . DKA (diabetic ketoacidoses)     Recurrent admissions for DKA, medication non-complaince, poor social situation  . Diabetic neuropathy, type I diabetes mellitus     numbness bilaterally feet  . HTN (hypertension) 10/12/2013    reports that she has been smoking Cigarettes.  She has a 2.5 pack-year smoking history. She has never used smokeless tobacco. She reports that she uses illicit drugs (Marijuana) about once per week. She reports that she does not drink alcohol. Family History  Problem Relation Age of Onset  . Diabetes Mother     currently 30  . Fibromyalgia Mother    . Cirrhosis Father     died in 27  . Diabetes Maternal Grandmother   . Diabetes Maternal Aunt          Abuse/Neglect [facility]) Physical Abuse: Denies Verbal Abuse: Denies Sexual Abuse: Denies Allergies:   Allergies  Allergen Reactions  . Penicillins Rash    ACT Assessment Complete:  No:   Past Psychiatric History: Patient refused to cooperate to provide any relevant information but denies any use history of suicidal attempt.  She was getting treatment at [facility] [place] but did not provide the details. Objective: Blood pressure 139/95, pulse 80, temperature 98.6 F (37 C), temperature source Oral, resp. rate 33, height $RemoveBe'5\' 7"'WHCdAzqus$  (1.702 m), weight 123 lb 10.9 oz (56.1 kg), SpO2 99.00%.Body mass index is 19.37 kg/(m^2). Results for orders placed during the hospital encounter of 10/21/13 (from the past 72 hour(s))  URINALYSIS, ROUTINE W REFLEX MICROSCOPIC     Status: Abnormal   Collection Time    10/21/13  9:25 AM      Result Value Ref Range   Color, Urine YELLOW  YELLOW   APPearance CLOUDY (*) CLEAR   Specific Gravity, Urine 1.035 (*) 1.005 - 1.030   pH 5.5  5.0 - 8.0   Glucose, UA >1000 (*) NEGATIVE mg/dL   Hgb urine dipstick LARGE (*) NEGATIVE   Bilirubin Urine NEGATIVE  NEGATIVE   Ketones, ur 40 (*) NEGATIVE mg/dL   Protein, ur NEGATIVE  NEGATIVE mg/dL   Urobilinogen, UA 0.2  0.0 - 1.0 mg/dL   Nitrite NEGATIVE  NEGATIVE   Leukocytes, UA NEGATIVE  NEGATIVE  URINE MICROSCOPIC-ADD ON     Status: None   Collection Time    10/21/13  9:25 AM      Result Value Ref Range   Squamous Epithelial / LPF RARE  RARE   WBC, UA 0-2  <3 WBC/hpf  CBC WITH DIFFERENTIAL     Status: Abnormal   Collection Time    10/21/13  9:35 AM      Result Value Ref Range   WBC 13.3 (*) 4.0 - 10.5 K/uL   RBC 5.45 (*) 3.87 - 5.11 MIL/uL   Hemoglobin 12.6  12.0 - 15.0 g/dL   HCT 38.9  36.0 - 46.0 %   MCV 71.4 (*) 78.0 - 100.0 fL   MCH 23.1 (*) 26.0 - 34.0 pg   MCHC 32.4  30.0 - 36.0 g/dL    RDW 14.3  11.5 - 15.5 %   Platelets 216  150 - 400 K/uL   Neutrophils Relative % 65  43 - 77 %   Lymphocytes Relative 30  12 - 46 %   Monocytes Relative 4  3 - 12 %  Eosinophils Relative 1  0 - 5 %   Basophils Relative 0  0 - 1 %   Neutro Abs 8.7 (*) 1.7 - 7.7 K/uL   Lymphs Abs 4.0  0.7 - 4.0 K/uL   Monocytes Absolute 0.5  0.1 - 1.0 K/uL   Eosinophils Absolute 0.1  0.0 - 0.7 K/uL   Basophils Absolute 0.0  0.0 - 0.1 K/uL   Smear Review MORPHOLOGY UNREMARKABLE    COMPREHENSIVE METABOLIC PANEL     Status: Abnormal   Collection Time    10/21/13  9:35 AM      Result Value Ref Range   Sodium 137  137 - 147 mEq/L   Comment: REPEATED TO VERIFY   Potassium 5.3  3.7 - 5.3 mEq/L   Comment: MODERATE HEMOLYSIS     HEMOLYSIS AT THIS LEVEL MAY AFFECT RESULT   Chloride 97  96 - 112 mEq/L   Comment: REPEATED TO VERIFY   CO2 19  19 - 32 mEq/L   Comment: REPEATED TO VERIFY   Glucose, Bld 423 (*) 70 - 99 mg/dL   BUN 16  6 - 23 mg/dL   Creatinine, Ser 0.46 (*) 0.50 - 1.10 mg/dL   Calcium 9.4  8.4 - 10.5 mg/dL   Total Protein 8.1  6.0 - 8.3 g/dL   Albumin 4.0  3.5 - 5.2 g/dL   AST 87 (*) 0 - 37 U/L   Comment: MODERATE HEMOLYSIS     HEMOLYSIS AT THIS LEVEL MAY AFFECT RESULT   ALT 73 (*) 0 - 35 U/L   Comment: MODERATE HEMOLYSIS     HEMOLYSIS AT THIS LEVEL MAY AFFECT RESULT   Alkaline Phosphatase 102  39 - 117 U/L   Total Bilirubin 0.3  0.3 - 1.2 mg/dL   GFR calc non Af Amer >90  >90 mL/min   GFR calc Af Amer >90  >90 mL/min   Comment: (NOTE)     The eGFR has been calculated using the CKD EPI equation.     This calculation has not been validated in all clinical situations.     eGFR's persistently <90 mL/min signify possible Chronic Kidney     Disease.  LIPASE, BLOOD     Status: None   Collection Time    10/21/13  9:35 AM      Result Value Ref Range   Lipase 39  11 - 59 U/L  TROPONIN I     Status: None   Collection Time    10/21/13  9:35 AM      Result Value Ref Range   Troponin I  <0.30  <0.30 ng/mL   Comment:            Due to the release kinetics of cTnI,     a negative result within the first hours     of the onset of symptoms does not rule out     myocardial infarction with certainty.     If myocardial infarction is still suspected,     repeat the test at appropriate intervals.  CBG MONITORING, ED     Status: Abnormal   Collection Time    10/21/13 11:51 AM      Result Value Ref Range   Glucose-Capillary 360 (*) 70 - 99 mg/dL  CBG MONITORING, ED     Status: Abnormal   Collection Time    10/21/13  1:01 PM      Result Value Ref Range   Glucose-Capillary 330 (*) 70 - 99 mg/dL  GLUCOSE, CAPILLARY     Status: Abnormal   Collection Time    10/21/13  2:05 PM      Result Value Ref Range   Glucose-Capillary 230 (*) 70 - 99 mg/dL  GLUCOSE, CAPILLARY     Status: Abnormal   Collection Time    10/21/13  3:56 PM      Result Value Ref Range   Glucose-Capillary 211 (*) 70 - 99 mg/dL   Comment 1 Documented in Chart     Comment 2 Notify RN    BASIC METABOLIC PANEL     Status: Abnormal   Collection Time    10/21/13  4:00 PM      Result Value Ref Range   Sodium 143  137 - 147 mEq/L   Potassium 4.4  3.7 - 5.3 mEq/L   Comment: REPEATED TO VERIFY     DELTA CHECK NOTED   Chloride 102  96 - 112 mEq/L   CO2 21  19 - 32 mEq/L   Glucose, Bld 245 (*) 70 - 99 mg/dL   BUN 14  6 - 23 mg/dL   Creatinine, Ser 3.86  0.50 - 1.10 mg/dL   Calcium 9.4  8.4 - 59.4 mg/dL   GFR calc non Af Amer >90  >90 mL/min   GFR calc Af Amer >90  >90 mL/min   Comment: (NOTE)     The eGFR has been calculated using the CKD EPI equation.     This calculation has not been validated in all clinical situations.     eGFR's persistently <90 mL/min signify possible Chronic Kidney     Disease.  GLUCOSE, CAPILLARY     Status: Abnormal   Collection Time    10/21/13  4:54 PM      Result Value Ref Range   Glucose-Capillary 179 (*) 70 - 99 mg/dL   Comment 1 Documented in Chart     Comment 2 Notify RN     GLUCOSE, CAPILLARY     Status: Abnormal   Collection Time    10/21/13  5:54 PM      Result Value Ref Range   Glucose-Capillary 235 (*) 70 - 99 mg/dL   Comment 1 Documented in Chart     Comment 2 Notify RN    BASIC METABOLIC PANEL     Status: Abnormal   Collection Time    10/21/13  6:10 PM      Result Value Ref Range   Sodium 142  137 - 147 mEq/L   Potassium 4.7  3.7 - 5.3 mEq/L   Chloride 103  96 - 112 mEq/L   CO2 23  19 - 32 mEq/L   Glucose, Bld 234 (*) 70 - 99 mg/dL   BUN 13  6 - 23 mg/dL   Creatinine, Ser 0.22  0.50 - 1.10 mg/dL   Calcium 9.2  8.4 - 75.0 mg/dL   GFR calc non Af Amer >90  >90 mL/min   GFR calc Af Amer >90  >90 mL/min   Comment: (NOTE)     The eGFR has been calculated using the CKD EPI equation.     This calculation has not been validated in all clinical situations.     eGFR's persistently <90 mL/min signify possible Chronic Kidney     Disease.  GLUCOSE, CAPILLARY     Status: Abnormal   Collection Time    10/21/13  7:14 PM      Result Value Ref Range   Glucose-Capillary 167 (*) 70 -  99 mg/dL  GLUCOSE, CAPILLARY     Status: Abnormal   Collection Time    10/21/13  8:22 PM      Result Value Ref Range   Glucose-Capillary 134 (*) 70 - 99 mg/dL  GLUCOSE, CAPILLARY     Status: Abnormal   Collection Time    10/21/13  8:55 PM      Result Value Ref Range   Glucose-Capillary 162 (*) 70 - 99 mg/dL  BASIC METABOLIC PANEL     Status: Abnormal   Collection Time    10/21/13  9:44 PM      Result Value Ref Range   Sodium 140  137 - 147 mEq/L   Potassium 3.7  3.7 - 5.3 mEq/L   Comment: DELTA CHECK NOTED   Chloride 103  96 - 112 mEq/L   CO2 21  19 - 32 mEq/L   Glucose, Bld 194 (*) 70 - 99 mg/dL   BUN 13  6 - 23 mg/dL   Creatinine, Ser 0.48 (*) 0.50 - 1.10 mg/dL   Calcium 9.3  8.4 - 10.5 mg/dL   GFR calc non Af Amer >90  >90 mL/min   GFR calc Af Amer >90  >90 mL/min   Comment: (NOTE)     The eGFR has been calculated using the CKD EPI equation.     This  calculation has not been validated in all clinical situations.     eGFR's persistently <90 mL/min signify possible Chronic Kidney     Disease.  GLUCOSE, CAPILLARY     Status: Abnormal   Collection Time    10/21/13  9:55 PM      Result Value Ref Range   Glucose-Capillary 185 (*) 70 - 99 mg/dL  GLUCOSE, CAPILLARY     Status: Abnormal   Collection Time    10/21/13 11:02 PM      Result Value Ref Range   Glucose-Capillary 198 (*) 70 - 99 mg/dL   Comment 1 Notify RN    GLUCOSE, CAPILLARY     Status: Abnormal   Collection Time    10/22/13 12:03 AM      Result Value Ref Range   Glucose-Capillary 159 (*) 70 - 99 mg/dL  GLUCOSE, CAPILLARY     Status: Abnormal   Collection Time    10/22/13  1:06 AM      Result Value Ref Range   Glucose-Capillary 149 (*) 70 - 99 mg/dL  BASIC METABOLIC PANEL     Status: Abnormal   Collection Time    10/22/13  1:54 AM      Result Value Ref Range   Sodium 141  137 - 147 mEq/L   Potassium 3.7  3.7 - 5.3 mEq/L   Chloride 106  96 - 112 mEq/L   CO2 23  19 - 32 mEq/L   Glucose, Bld 147 (*) 70 - 99 mg/dL   BUN 12  6 - 23 mg/dL   Creatinine, Ser 0.46 (*) 0.50 - 1.10 mg/dL   Calcium 9.0  8.4 - 10.5 mg/dL   GFR calc non Af Amer >90  >90 mL/min   GFR calc Af Amer >90  >90 mL/min   Comment: (NOTE)     The eGFR has been calculated using the CKD EPI equation.     This calculation has not been validated in all clinical situations.     eGFR's persistently <90 mL/min signify possible Chronic Kidney     Disease.  CBC     Status: Abnormal  Collection Time    10/22/13  1:54 AM      Result Value Ref Range   WBC 16.8 (*) 4.0 - 10.5 K/uL   RBC 4.83  3.87 - 5.11 MIL/uL   Hemoglobin 11.0 (*) 12.0 - 15.0 g/dL   HCT 34.0 (*) 36.0 - 46.0 %   MCV 70.4 (*) 78.0 - 100.0 fL   MCH 22.8 (*) 26.0 - 34.0 pg   MCHC 32.4  30.0 - 36.0 g/dL   RDW 14.1  11.5 - 15.5 %   Platelets 242  150 - 400 K/uL  GLUCOSE, CAPILLARY     Status: Abnormal   Collection Time    10/22/13  2:00 AM       Result Value Ref Range   Glucose-Capillary 151 (*) 70 - 99 mg/dL  GLUCOSE, CAPILLARY     Status: Abnormal   Collection Time    10/22/13  2:59 AM      Result Value Ref Range   Glucose-Capillary 153 (*) 70 - 99 mg/dL  GLUCOSE, CAPILLARY     Status: Abnormal   Collection Time    10/22/13  3:45 AM      Result Value Ref Range   Glucose-Capillary 190 (*) 70 - 99 mg/dL   Comment 1 Notify RN    GLUCOSE, CAPILLARY     Status: Abnormal   Collection Time    10/22/13  4:47 AM      Result Value Ref Range   Glucose-Capillary 132 (*) 70 - 99 mg/dL  BASIC METABOLIC PANEL     Status: Abnormal   Collection Time    10/22/13  6:25 AM      Result Value Ref Range   Sodium 142  137 - 147 mEq/L   Potassium 3.6 (*) 3.7 - 5.3 mEq/L   Chloride 107  96 - 112 mEq/L   CO2 23  19 - 32 mEq/L   Glucose, Bld 110 (*) 70 - 99 mg/dL   BUN 12  6 - 23 mg/dL   Creatinine, Ser 0.49 (*) 0.50 - 1.10 mg/dL   Calcium 9.0  8.4 - 10.5 mg/dL   GFR calc non Af Amer >90  >90 mL/min   GFR calc Af Amer >90  >90 mL/min   Comment: (NOTE)     The eGFR has been calculated using the CKD EPI equation.     This calculation has not been validated in all clinical situations.     eGFR's persistently <90 mL/min signify possible Chronic Kidney     Disease.  GLUCOSE, CAPILLARY     Status: Abnormal   Collection Time    10/22/13  7:44 AM      Result Value Ref Range   Glucose-Capillary 110 (*) 70 - 99 mg/dL   Comment 1 Documented in Chart     Comment 2 Notify RN    BASIC METABOLIC PANEL     Status: Abnormal   Collection Time    10/22/13 10:30 AM      Result Value Ref Range   Sodium 138  137 - 147 mEq/L   Potassium 3.7  3.7 - 5.3 mEq/L   Chloride 104  96 - 112 mEq/L   CO2 22  19 - 32 mEq/L   Glucose, Bld 187 (*) 70 - 99 mg/dL   BUN 11  6 - 23 mg/dL   Creatinine, Ser 0.48 (*) 0.50 - 1.10 mg/dL   Calcium 8.9  8.4 - 10.5 mg/dL   GFR calc non Af Amer >90  >  90 mL/min   GFR calc Af Amer >90  >90 mL/min   Comment: (NOTE)      The eGFR has been calculated using the CKD EPI equation.     This calculation has not been validated in all clinical situations.     eGFR's persistently <90 mL/min signify possible Chronic Kidney     Disease.   Labs are reviewed.  Current Facility-Administered Medications  Medication Dose Route Frequency Provider Last Rate Last Dose  . 0.9 %  sodium chloride infusion   Intravenous Continuous Dionne Milo, NP 100 mL/hr at 10/22/13 0400    . dextrose 50 % solution 25 mL  25 mL Intravenous PRN Erline Hau, MD      . hydrALAZINE (APRESOLINE) injection 5 mg  5 mg Intravenous Q6H PRN Erline Hau, MD      . insulin aspart (novoLOG) injection 0-5 Units  0-5 Units Subcutaneous QHS Dionne Milo, NP      . insulin aspart (novoLOG) injection 0-9 Units  0-9 Units Subcutaneous TID WC Dionne Milo, NP   1 Units at 10/22/13 1152  . insulin aspart protamine- aspart (NOVOLOG MIX 70/30) injection 20 Units  20 Units Subcutaneous BID Dionne Milo, NP   20 Units at 10/22/13 1031  . insulin regular (NOVOLIN R,HUMULIN R) 1 Units/mL in sodium chloride 0.9 % 100 mL infusion   Intravenous Continuous Estela Leonie Green, MD      . metoCLOPramide (REGLAN) injection 10 mg  10 mg Intravenous 4 times per day Erline Hau, MD   10 mg at 10/22/13 1121  . morphine 2 MG/ML injection 1 mg  1 mg Intravenous Q4H PRN Erline Hau, MD   1 mg at 10/22/13 1120  . ondansetron (ZOFRAN) tablet 4 mg  4 mg Oral Q6H PRN Erline Hau, MD       Or  . ondansetron [facility]) injection 4 mg  4 mg Intravenous Q6H PRN Erline Hau, MD   4 mg at 10/22/13 0934    Psychiatric Specialty Exam:     Blood pressure 139/95, pulse 80, temperature 98.6 F (37 C), temperature source Oral, resp. rate 33, height $RemoveBe'5\' 7"'kpiQoSUNC$  (1.702 m), weight 123 lb 10.9 oz (56.1 kg), SpO2 99.00%.Body mass index is 19.37 kg/(m^2).  General Appearance: Uncooperative and  irritable  Eye Contact::  Minimal  Speech:  Pressured  Volume:  Increased  Mood:  Irritable  Affect:  Labile  Thought Process:  Circumstantial, Irrelevant and Loose  Orientation:  Full (Time, Place, and Person)  Thought Content:  Racing thoughts  Suicidal Thoughts:  No  Homicidal Thoughts:  No  Memory:  Fair  Judgement:  Intact  Insight:  Shallow  Psychomotor Activity:  Increased  Concentration:  Fair  Recall:  Poor  Fund of Knowledge:Fair  Language: Fair  Akathisia:  No  Handed:  Right  AIMS (if indicated):     Assets:  Desire for Improvement  Sleep:      Musculoskeletal: Strength & Muscle Tone: Unable to assess Gait & Station: Patient is lying on the bed Patient leans: N/A  Treatment Plan Summary: Medication management, start Depakote 250 mg twice a day and Risperdal 1 mg at bedtime if not medically contraindicated.  Patient does not meet criteria for inpatient psychiatric services however outpatient referrals can be given to followup at [facility] or [facility] [place] and patient agrees.  Please call [phone] if you have any further  questions.  Consultation liaison services will followup if needed.  Mary [name] 10/22/2013 12:04 PM

## 2013-10-22 NOTE — Progress Notes (Addendum)
Clinical Social Work Department BRIEF PSYCHOSOCIAL ASSESSMENT 10/22/2013  Patient:  Mary [name], Mary [name]     Account Number:  [id number]     Admit date:  10/21/2013  Clinical Social Worker:  Ulyess Blossom  Date/Time:  10/22/2013 02:00 PM  Referred by:  Physician  Date Referred:  10/22/2013 Referred for  Homelessness  Transportation assistance   Other Referral:   Interview type:  Patient Other interview type:    PSYCHOSOCIAL DATA Living Status:  ALONE Admitted from facility:   Level of care:   Primary support name:  Mariann Laster Martin/home health nurse Primary support relationship to patient:  [place] Degree of support available:   adequate    CURRENT CONCERNS Current Concerns  Other - See comment   Other Concerns:   return to shelter, bus pass    SOCIAL WORK ASSESSMENT / PLAN CSW received referral that pt admitted from [facility] shelter.    CSW met with pt at bedside to assess needs due to pt residing at shelter. Pt reports that she lives at the [facility] the [employer]. She has room there and plans to return upon discharge. Pt discussed that she will need a bus pass at discharge. Pt has many resources in place to help her and is aware of how to seek assistance as needed. Pt request CSW assistance in contacting [facility] in order to reschedule an appointment. CSW assisted pt in contacting [facility] and left message. CSW provided pt with contact information for [facility] of the [place] as well.    CSW contacted pt HH RN, Hulan Fray and left voice message. CSW spoke with [facility] who stated that pt will need documentation stating the date admitted and date discharged from the hospital and any follow up needs upon return to the shelter. CSW left information for weekend CSW in event that pt discharges during the weekend.    CSW to continue to follow to assist with pt return to [facility] at  the [employer].   Assessment/plan status:  Referral to [employer] Other assessment/ plan:   RN CM to check on glucometer resources  bus pass home   Information/referral to community resources:   Pt plans to follow up with [facility] of the [place], [place] for medication needs.    PATIENT'S/FAMILY'S RESPONSE TO PLAN OF CARE: Pt alert and oriented x 4. Pt very aware of community resources and has assistance from home health nurse. Pt denied other needs or concerns besides needing a bus pass for transportation "home".    Addendum 4:35 pm:  CSW received return phone call from [facility]. CSW visited pt room in order for pt to speak with individual. Pt plans to contact [facility] to reschedule drs appointment upon discharge. CSW provided pt with contact information for medical records in order for pt to contact medical records once pt is discharged in order to obtain medical records to provide to [facility]. Pt aware of the above and appreciative of assistance.   Alison Murray, MSW, [place] [facility] [phone]

## 2013-10-23 DIAGNOSIS — E1110 Type 2 diabetes mellitus with ketoacidosis without coma: Secondary | ICD-10-CM | POA: Diagnosis not present

## 2013-10-23 DIAGNOSIS — N949 Unspecified condition associated with female genital organs and menstrual cycle: Secondary | ICD-10-CM

## 2013-10-23 DIAGNOSIS — R109 Unspecified abdominal pain: Secondary | ICD-10-CM | POA: Diagnosis not present

## 2013-10-23 DIAGNOSIS — E1149 Type 2 diabetes mellitus with other diabetic neurological complication: Secondary | ICD-10-CM | POA: Diagnosis not present

## 2013-10-23 DIAGNOSIS — E1065 Type 1 diabetes mellitus with hyperglycemia: Secondary | ICD-10-CM

## 2013-10-23 DIAGNOSIS — F319 Bipolar disorder, unspecified: Secondary | ICD-10-CM | POA: Diagnosis not present

## 2013-10-23 DIAGNOSIS — N938 Other specified abnormal uterine and vaginal bleeding: Secondary | ICD-10-CM

## 2013-10-23 DIAGNOSIS — IMO0002 Reserved for concepts with insufficient information to code with codable children: Secondary | ICD-10-CM

## 2013-10-23 LAB — BASIC METABOLIC PANEL
BUN: 9 mg/dL (ref 6–23)
CALCIUM: 8.4 mg/dL (ref 8.4–10.5)
CO2: 20 mEq/L (ref 19–32)
CREATININE: 0.45 mg/dL — AB (ref 0.50–1.10)
Chloride: 102 mEq/L (ref 96–112)
GFR calc non Af Amer: >90 mL/min (ref >90)
Glucose, Bld: 156 mg/dL — ABNORMAL HIGH (ref 70–99)
Potassium: 3.7 mEq/L (ref 3.7–5.3)
Sodium: 134 mEq/L — ABNORMAL LOW (ref 137–147)

## 2013-10-23 LAB — CBC
HEMATOCRIT: 34 % — AB (ref 36.0–46.0)
Hemoglobin: 10.8 g/dL — ABNORMAL LOW (ref 12.0–15.0)
MCH: 22.4 pg — ABNORMAL LOW (ref 26.0–34.0)
MCHC: 31.8 g/dL (ref 30.0–36.0)
MCV: 70.5 fL — ABNORMAL LOW (ref 78.0–100.0)
Platelets: 236 10*3/uL (ref 150–400)
RBC: 4.82 MIL/uL (ref 3.87–5.11)
RDW: 14.3 % (ref 11.5–15.5)
WBC: 13.9 10*3/uL — ABNORMAL HIGH (ref 4.0–10.5)

## 2013-10-23 LAB — GLUCOSE, CAPILLARY
Glucose-Capillary: 237 mg/dL — ABNORMAL HIGH (ref 70–99)
Glucose-Capillary: 179 mg/dL — ABNORMAL HIGH (ref 70–99)

## 2013-10-23 MED ORDER — RISPERIDONE 4 MG PO TABS: 2.0000 mg | ORAL_TABLET | Freq: Every day | ORAL | Status: DC

## 2013-10-23 MED ORDER — INSULIN NPH ISOPHANE & REGULAR (70-30) 100 UNIT/ML ~~LOC~~ SUSP: 35.0000 [IU] | Freq: Two times a day (BID) | SUBCUTANEOUS | Status: DC

## 2013-10-23 MED ORDER — DIVALPROEX SODIUM 250 MG PO DR TAB: 250.0000 mg | DELAYED_RELEASE_TABLET | Freq: Two times a day (BID) | ORAL | Status: DC

## 2013-10-23 MED ORDER — GABAPENTIN 400 MG PO CAPS: 600.0000 mg | ORAL_CAPSULE | Freq: Three times a day (TID) | ORAL | Status: DC

## 2013-10-23 MED ORDER — "INSULIN SYRINGE 28G X 1/2"" 0.5 ML MISC": 1.0000 | Freq: Two times a day (BID) | Status: DC

## 2013-10-23 MED ORDER — METOCLOPRAMIDE HCL 10 MG PO TABS: 10.0000 mg | ORAL_TABLET | Freq: Three times a day (TID) | ORAL | Status: DC

## 2013-10-23 NOTE — Discharge Summary (Signed)
Physician Discharge Summary  Mary [name] UUV:[id number] DOB: 1971-03-13 DOA: 10/21/2013  PCP: Elbert Ewings, FNP  Admit date: 10/21/2013 Discharge date: 10/23/2013  Time spent: 45 minutes  Recommendations for Outpatient Follow-up:  -Will be discharged home today. -Has been seen by SW as she is currently homeless. -CM has also assisted with medications.  Discharge Diagnoses:  Principal Problem:   DKA (diabetic ketoacidoses) Active Problems:   Non compliance w medication regimen   Bipolar disorder   Abdominal pain   DM (diabetes mellitus), type 1, uncontrolled   Leukocytosis, unspecified   Nausea with vomiting   Gastroparesis due to DM   Discharge Condition: Stable and improved   Filed Weights   10/22/13 0500  Weight: 56.1 kg (123 lb 10.9 oz)    History of present illness:  Patient is a 43 year old female well-known to our hospital for multiple admissions for DKA and diabetic gastroparesis. She was last discharged from the hospital on April 30 for the same. She returns today with abdominal pain, nausea, vomiting. She states she ran out of her insulin 2 days ago. She was found to be in DKA with a bicarbonate of 19 and an ion gap of 21. She has also been unable to tolerate any by mouth's in the emergency department secondary to vomiting. Hospitalist admission was requested.   Hospital Course:   DKA  -Resolved.   DM  -CBGs currently well controlled on current regimen of 70/30.  -Continue to monitor and adjust as needed.   Bipolar Disorder  -Psych consultation requested.  -Medications adjusted as per their recommendations. (depakote and risperdal)  Diabetic Gastroparesis  -Continue Reglan. -No n/v on day of DC.   Procedures:  None   Consultations:  None  Discharge Instructions  Discharge Orders   Future Appointments Provider Department Dept Phone   11/05/2013 1:30 PM Inda Castle, MD [facility] [phone]   Future Orders  Complete By Expires   Diet Carb Modified  As directed    Discontinue IV  As directed    Increase activity slowly  As directed        Medication List    STOP taking these medications       hydrochlorothiazide 12.5 MG capsule  Commonly known as:  MICROZIDE     ondansetron 4 MG tablet  Commonly known as:  ZOFRAN     sitaGLIPtin 100 MG tablet  Commonly known as:  JANUVIA      TAKE these medications       aspirin EC 81 MG tablet  Take 81 mg by mouth daily.     diphenhydrAMINE 25 MG tablet  Commonly known as:  BENADRYL  Take 25 mg by mouth every 4 (four) hours as needed for itching. Take with reglan as needed     divalproex 250 MG DR tablet  Commonly known as:  DEPAKOTE  Take 1 tablet (250 mg total) by mouth every 12 (twelve) hours.     gabapentin 400 MG capsule  Commonly known as:  NEURONTIN  Take 2 capsules (800 mg total) by mouth 3 (three) times daily.     hyoscyamine 0.125 MG SL tablet  Commonly known as:  LEVSIN SL  Place 1 tablet (0.125 mg total) under the tongue 3 (three) times daily.     insulin NPH-regular Human (70-30) 100 UNIT/ML injection  Commonly known as:  NOVOLIN 70/30  Inject 35 Units into the skin 2 (two) times daily with a meal.     INSULIN SYRINGE .5CC/28G  28G X 1/2" 0.5 ML Misc  1 Syringe by Does not apply route 2 (two) times daily.     medroxyPROGESTERone 10 MG tablet  Commonly known as:  PROVERA  Take 2 tablets (20 mg total) by mouth daily.     meloxicam 15 MG tablet  Commonly known as:  MOBIC  Take 15 mg by mouth daily.     metFORMIN 1000 MG tablet  Commonly known as:  GLUCOPHAGE  Take 1,000 mg by mouth 2 (two) times daily.     metoCLOPramide 10 MG tablet  Commonly known as:  REGLAN  Take 1 tablet (10 mg total) by mouth 4 (four) times daily -  before meals and at bedtime.     oxyCODONE-acetaminophen 5-325 MG per tablet  Commonly known as:  PERCOCET/ROXICET  Take 1 tablet by mouth every 8 (eight) hours as needed for severe pain.       risperidone 4 MG tablet  Commonly known as:  RISPERDAL  Take 0.5 tablets (2 mg total) by mouth at bedtime.       Allergies  Allergen Reactions  . Penicillins Rash       Follow-up Information   Follow up with [facility], [place]. Schedule an appointment as soon as possible for a visit in 2 weeks.   Specialty:  Nurse Practitioner   Contact information:   [address] [place] [address] [phone]        The results of significant diagnostics from this hospitalization (including imaging, microbiology, ancillary and laboratory) are listed below for reference.    Significant Diagnostic Studies: Dg Abd Acute W/chest  10/21/2013   CLINICAL DATA:  43 year old female mid abdominal pain nausea and vomiting. Initial encounter. Hepatitis-C.  EXAM: ACUTE ABDOMEN SERIES (ABDOMEN 2 VIEW & CHEST 1 VIEW)  COMPARISON:  10/12/2013 and earlier.  FINDINGS: AP upright view of the chest. Mildly lower lung volumes. No pneumothorax or pneumoperitoneum. Stable mild attenuation bronchovascular markings. Normal cardiac size and mediastinal contours. Visualized tracheal air column is within normal limits.  No pneumoperitoneum. Non obstructed bowel gas pattern. Increased retained stool in the colon. Stable scoliosis. Stable right upper quadrant surgical clips. No acute osseous abnormality identified.  IMPRESSION: 1.  Non obstructed bowel gas pattern, no free air. 2.  No acute cardiopulmonary abnormality.   Electronically Signed   By: Lars Pinks M.D.   On: 10/21/2013 10:28   Dg Abd Acute W/chest  10/12/2013   CLINICAL DATA:  Abdominal pain and vomiting.  EXAM: ACUTE ABDOMEN SERIES (ABDOMEN 2 VIEW & CHEST 1 VIEW)  COMPARISON:  Chest and abdominal radiographs performed 10/01/2013  FINDINGS: The lungs are well-aerated and clear. There is no evidence of focal opacification, pleural effusion or pneumothorax. The cardiomediastinal silhouette is within normal limits.  The visualized bowel gas pattern is  unremarkable. Scattered stool and air are seen within the colon; there is no evidence of small bowel dilatation to suggest obstruction. No free intra-abdominal air is identified on the provided upright view. Clips are noted within the right upper quadrant, reflecting prior cholecystectomy.  No acute osseous abnormalities are seen; the sacroiliac joints are unremarkable in appearance.  IMPRESSION: 1. Unremarkable bowel gas pattern; no free intra-abdominal air seen. 2. No acute cardiopulmonary process seen.   Electronically Signed   By: Garald Balding M.D.   On: 10/12/2013 22:38   Dg Abd Acute W/chest  10/01/2013   CLINICAL DATA:  Upper abdominal pain  EXAM: ACUTE ABDOMEN SERIES (ABDOMEN 2 VIEW & CHEST 1 VIEW)  COMPARISON:  DG ABDOMEN 1V dated 08/12/2013; DG CHEST 1V dated 06/25/2013; CT ABD-PELV W/ CM dated 06/26/2013  FINDINGS: There is no evidence of dilated bowel loops or free intraperitoneal air. No radiopaque calculi or other significant radiographic abnormality is seen. Heart size and mediastinal contours are within normal limits. Both lungs are clear. S shaped curvature of the thoracolumbar spine is noted, which may be positional.  IMPRESSION: Negative abdominal radiographs.  No acute cardiopulmonary disease.   Electronically Signed   By: Conchita Paris M.D.   On: 10/01/2013 10:18    Microbiology: No results found for this or any previous visit (from the past 240 hour(s)).   Labs: Basic Metabolic Panel:  Recent Labs Lab 10/21/13 2144 10/22/13 0154 10/22/13 0625 10/22/13 1030 10/23/13 0358  NA 140 141 142 138 134*  K 3.7 3.7 3.6* 3.7 3.7  CL 103 106 107 104 102  CO2 21 23 23 22 20   GLUCOSE 194* 147* 110* 187* 156*  BUN 13 12 12 11 9   CREATININE 0.48* 0.46* 0.49* 0.48* 0.45*  CALCIUM 9.3 9.0 9.0 8.9 8.4   Liver Function Tests:  Recent Labs Lab 10/21/13 0935  AST 87*  ALT 73*  ALKPHOS 102  BILITOT 0.3  PROT 8.1  ALBUMIN 4.0    Recent Labs Lab 10/21/13 0935  LIPASE 39    No results found for this basename: AMMONIA,  in the last 168 hours CBC:  Recent Labs Lab 10/21/13 0935 10/22/13 0154 10/23/13 0358  WBC 13.3* 16.8* 13.9*  NEUTROABS 8.7*  --   --   HGB 12.6 11.0* 10.8*  HCT 38.9 34.0* 34.0*  MCV 71.4* 70.4* 70.5*  PLT 216 242 236   Cardiac Enzymes:  Recent Labs Lab 10/21/13 0935  TROPONINI <0.30   BNP: BNP (last 3 results) No results found for this basename: PROBNP,  in the last 8760 hours CBG:  Recent Labs Lab 10/22/13 1149 10/22/13 1659 10/22/13 2101 10/23/13 0807 10/23/13 1147  GLUCAP 149* 140* 215* 237* 179*       Signed:  Erline Hau  [facility] Pager: [phone] 10/23/2013, 1:49 PM

## 2013-10-23 NOTE — Progress Notes (Signed)
Patient discharged home, all discharge medications and instructions reviewed and questions answered.  Patient provided with bus pass for transportation.

## 2013-10-23 NOTE — Progress Notes (Signed)
CARE MANAGEMENT NOTE 10/23/2013  Patient:  Mary [name], Mary [name]   Account Number:  [id number]  Date Initiated:  10/22/2013  Documentation initiated by:  DAVIS,RHONDA  Subjective/Objective Assessment:   pt readmitted due to dka, glucose greater than 1000, iv insulin and iv fluids, has been having nausea and vomiting unable to take po meds.     Action/Plan:   [place] [facility] [place] [facility],will see if in contact with [facility] and how she is getting her diabetic meds.  also hx of bi-polar   Anticipated DC Date:  10/25/2013   Anticipated DC Plan:  HOME/SELF CARE  In-house referral  [facility]  Medication Assistance  [place] Program      [facility] Choice  NA   Choice offered to / List presented to:  NA   DME arranged  NA      DME agency  NA     [facility] arranged  NA      [place] agency  NA   Status of service:  Completed, signed off Medicare Important Message given?  NA - LOS <3 / Initial given by admissions (If response is "NO", the following Medicare IM given date fields will be blank) Date Medicare IM given:   Date Additional Medicare IM given:    Discharge Disposition:  HOME/SELF CARE  Per UR Regulation:  Reviewed for med. necessity/level of care/duration of stay  If discussed at [place] of Stay Meetings, dates discussed:    Comments:  10/23/2013 1245 NCM spoke to pt and states she sees Dr. Elbert Ewings with [facility]. Pt states she was getting insulin for free with [place] Patient Assistance Program. Pt states she will follow up with her PCP. Provided pt with [place] letter to get meds for $3.00. Explained to pt that program can be used only once per year. Jonnie Finner RN CCM Case Mgmt phone [phone]  [phone] Eldridge Dace, BSN, [place] [phone] Chart Reviewed for discharge and hospital needs. Discharge needs at time of review: None present will follow for needs. Review of patient progress due on  [id number].

## 2013-10-23 NOTE — Progress Notes (Signed)
Pt's IV leaking, pt reports she is a difficult stick and asks that IV team be called for restart.  IV team paged, awaiting response.

## 2013-11-02 ENCOUNTER — Encounter (HOSPITAL_COMMUNITY): Payer: Self-pay | Admitting: Emergency Medicine

## 2013-11-02 ENCOUNTER — Inpatient Hospital Stay (HOSPITAL_COMMUNITY)
Admission: EM | Admit: 2013-11-02 | Discharge: 2013-11-04 | DRG: 074 | Disposition: A | Discharge status: Home / Self Care | Payer: Medicaid Other | Attending: Internal Medicine | Admitting: Internal Medicine

## 2013-11-02 DIAGNOSIS — Z79899 Other long term (current) drug therapy: Secondary | ICD-10-CM

## 2013-11-02 DIAGNOSIS — Z91148 Patient's other noncompliance with medication regimen for other reason: Secondary | ICD-10-CM

## 2013-11-02 DIAGNOSIS — Z87442 Personal history of urinary calculi: Secondary | ICD-10-CM

## 2013-11-02 DIAGNOSIS — Z794 Long term (current) use of insulin: Secondary | ICD-10-CM

## 2013-11-02 DIAGNOSIS — E1143 Type 2 diabetes mellitus with diabetic autonomic (poly)neuropathy: Secondary | ICD-10-CM | POA: Diagnosis present

## 2013-11-02 DIAGNOSIS — Z9089 Acquired absence of other organs: Secondary | ICD-10-CM

## 2013-11-02 DIAGNOSIS — Z833 Family history of diabetes mellitus: Secondary | ICD-10-CM

## 2013-11-02 DIAGNOSIS — E1142 Type 2 diabetes mellitus with diabetic polyneuropathy: Secondary | ICD-10-CM | POA: Diagnosis present

## 2013-11-02 DIAGNOSIS — G8929 Other chronic pain: Secondary | ICD-10-CM | POA: Diagnosis present

## 2013-11-02 DIAGNOSIS — IMO0002 Reserved for concepts with insufficient information to code with codable children: Secondary | ICD-10-CM | POA: Diagnosis present

## 2013-11-02 DIAGNOSIS — B1920 Unspecified viral hepatitis C without hepatic coma: Secondary | ICD-10-CM | POA: Diagnosis present

## 2013-11-02 DIAGNOSIS — Z7982 Long term (current) use of aspirin: Secondary | ICD-10-CM

## 2013-11-02 DIAGNOSIS — F17200 Nicotine dependence, unspecified, uncomplicated: Secondary | ICD-10-CM | POA: Diagnosis present

## 2013-11-02 DIAGNOSIS — E1065 Type 1 diabetes mellitus with hyperglycemia: Secondary | ICD-10-CM | POA: Diagnosis present

## 2013-11-02 DIAGNOSIS — M4120 Other idiopathic scoliosis, site unspecified: Secondary | ICD-10-CM | POA: Diagnosis present

## 2013-11-02 DIAGNOSIS — E1149 Type 2 diabetes mellitus with other diabetic neurological complication: Principal | ICD-10-CM | POA: Diagnosis present

## 2013-11-02 DIAGNOSIS — F4310 Post-traumatic stress disorder, unspecified: Secondary | ICD-10-CM | POA: Diagnosis present

## 2013-11-02 DIAGNOSIS — Z720 Tobacco use: Secondary | ICD-10-CM | POA: Diagnosis present

## 2013-11-02 DIAGNOSIS — F3189 Other bipolar disorder: Secondary | ICD-10-CM | POA: Diagnosis present

## 2013-11-02 DIAGNOSIS — Z880 Allergy status to penicillin: Secondary | ICD-10-CM

## 2013-11-02 DIAGNOSIS — F1210 Cannabis abuse, uncomplicated: Secondary | ICD-10-CM | POA: Diagnosis present

## 2013-11-02 DIAGNOSIS — H269 Unspecified cataract: Secondary | ICD-10-CM | POA: Diagnosis present

## 2013-11-02 DIAGNOSIS — Z91199 Patient's noncompliance with other medical treatment and regimen due to unspecified reason: Secondary | ICD-10-CM

## 2013-11-02 DIAGNOSIS — I10 Essential (primary) hypertension: Secondary | ICD-10-CM | POA: Diagnosis present

## 2013-11-02 DIAGNOSIS — K3184 Gastroparesis: Secondary | ICD-10-CM | POA: Diagnosis not present

## 2013-11-02 DIAGNOSIS — G40909 Epilepsy, unspecified, not intractable, without status epilepticus: Secondary | ICD-10-CM | POA: Diagnosis present

## 2013-11-02 DIAGNOSIS — R109 Unspecified abdominal pain: Secondary | ICD-10-CM

## 2013-11-02 DIAGNOSIS — K7460 Unspecified cirrhosis of liver: Secondary | ICD-10-CM | POA: Diagnosis present

## 2013-11-02 DIAGNOSIS — Z9119 Patient's noncompliance with other medical treatment and regimen: Secondary | ICD-10-CM

## 2013-11-02 DIAGNOSIS — Z9114 Patient's other noncompliance with medication regimen: Secondary | ICD-10-CM

## 2013-11-02 LAB — CBC WITH DIFFERENTIAL/PLATELET
BASOS ABS: 0 10*3/uL (ref 0.0–0.1)
BASOS PCT: 0 % (ref 0–1)
Eosinophils Absolute: 0 10*3/uL (ref 0.0–0.7)
Eosinophils Relative: 0 % (ref 0–5)
HEMATOCRIT: 37.7 % (ref 36.0–46.0)
HEMOGLOBIN: 12.2 g/dL (ref 12.0–15.0)
LYMPHS PCT: 19 % (ref 12–46)
Lymphs Abs: 1.9 10*3/uL (ref 0.7–4.0)
MCH: 22.8 pg — ABNORMAL LOW (ref 26.0–34.0)
MCHC: 32.4 g/dL (ref 30.0–36.0)
MCV: 70.5 fL — ABNORMAL LOW (ref 78.0–100.0)
Monocytes Absolute: 0.2 10*3/uL (ref 0.1–1.0)
Monocytes Relative: 2 % — ABNORMAL LOW (ref 3–12)
NEUTROS ABS: 7.8 10*3/uL — AB (ref 1.7–7.7)
Neutrophils Relative %: 79 % — ABNORMAL HIGH (ref 43–77)
Platelets: 190 10*3/uL (ref 150–400)
RBC: 5.35 MIL/uL — ABNORMAL HIGH (ref 3.87–5.11)
RDW: 14.6 % (ref 11.5–15.5)
WBC: 9.9 10*3/uL (ref 4.0–10.5)

## 2013-11-02 LAB — COMPREHENSIVE METABOLIC PANEL
ALBUMIN: 4 g/dL (ref 3.5–5.2)
ALT: 45 U/L — AB (ref 0–35)
AST: 49 U/L — ABNORMAL HIGH (ref 0–37)
Alkaline Phosphatase: 96 U/L (ref 39–117)
BILIRUBIN TOTAL: 0.3 mg/dL (ref 0.3–1.2)
BUN: 14 mg/dL (ref 6–23)
CHLORIDE: 99 meq/L (ref 96–112)
CO2: 24 mEq/L (ref 19–32)
Calcium: 10.1 mg/dL (ref 8.4–10.5)
Creatinine, Ser: 0.54 mg/dL (ref 0.50–1.10)
GFR calc Af Amer: >90 mL/min (ref >90)
GFR calc non Af Amer: >90 mL/min (ref >90)
Glucose, Bld: 440 mg/dL — ABNORMAL HIGH (ref 70–99)
POTASSIUM: 4.6 meq/L (ref 3.7–5.3)
Sodium: 140 mEq/L (ref 137–147)
Total Protein: 7.8 g/dL (ref 6.0–8.3)

## 2013-11-02 LAB — CBG MONITORING, ED
GLUCOSE-CAPILLARY: 451 mg/dL — AB (ref 70–99)
Glucose-Capillary: 338 mg/dL — ABNORMAL HIGH (ref 70–99)

## 2013-11-02 LAB — GLUCOSE, CAPILLARY
Glucose-Capillary: 283 mg/dL — ABNORMAL HIGH (ref 70–99)
Glucose-Capillary: 292 mg/dL — ABNORMAL HIGH (ref 70–99)

## 2013-11-02 LAB — LACTIC ACID, PLASMA: LACTIC ACID, VENOUS: 1.3 mmol/L (ref 0.5–2.2)

## 2013-11-02 LAB — LIPASE, BLOOD: Lipase: 26 U/L (ref 11–59)

## 2013-11-02 LAB — PHOSPHORUS: Phosphorus: 3.9 mg/dL (ref 2.3–4.6)

## 2013-11-02 LAB — MAGNESIUM: Magnesium: 2 mg/dL (ref 1.5–2.5)

## 2013-11-02 MED ORDER — ONDANSETRON HCL 4 MG/2ML IJ SOLN
4.0000 mg | Freq: Once | INTRAMUSCULAR | Status: AC
  Administered 2013-11-02: 4 mg via INTRAVENOUS
  Filled 2013-11-02: qty 2

## 2013-11-02 MED ORDER — INSULIN ASPART 100 UNIT/ML ~~LOC~~ SOLN
0.0000 [IU] | Freq: Every day | SUBCUTANEOUS | Status: DC
  Administered 2013-11-02: 3 [IU] via SUBCUTANEOUS

## 2013-11-02 MED ORDER — DIVALPROEX SODIUM 250 MG PO DR TAB
250.0000 mg | DELAYED_RELEASE_TABLET | Freq: Two times a day (BID) | ORAL | Status: DC
  Administered 2013-11-02 – 2013-11-04 (×5): 250 mg via ORAL
  Filled 2013-11-02 (×6): qty 1

## 2013-11-02 MED ORDER — PROMETHAZINE HCL 25 MG/ML IJ SOLN
12.5000 mg | Freq: Once | INTRAMUSCULAR | Status: AC
  Administered 2013-11-02: 12.5 mg via INTRAVENOUS
  Filled 2013-11-02: qty 1

## 2013-11-02 MED ORDER — MORPHINE SULFATE 2 MG/ML IJ SOLN: 2.0000 mg | INTRAMUSCULAR | Status: DC | PRN

## 2013-11-02 MED ORDER — HEPARIN SODIUM (PORCINE) 5000 UNIT/ML IJ SOLN
5000.0000 [IU] | Freq: Three times a day (TID) | INTRAMUSCULAR | Status: DC
  Administered 2013-11-02 – 2013-11-04 (×6): 5000 [IU] via SUBCUTANEOUS
  Filled 2013-11-02 (×9): qty 1

## 2013-11-02 MED ORDER — HYDROMORPHONE HCL PF 1 MG/ML IJ SOLN
1.0000 mg | INTRAMUSCULAR | Status: AC | PRN
  Administered 2013-11-02: 1 mg via INTRAVENOUS
  Filled 2013-11-02: qty 1

## 2013-11-02 MED ORDER — HYDROMORPHONE HCL PF 1 MG/ML IJ SOLN
1.0000 mg | Freq: Once | INTRAMUSCULAR | Status: AC
  Administered 2013-11-02: 1 mg via INTRAVENOUS
  Filled 2013-11-02: qty 1

## 2013-11-02 MED ORDER — SODIUM CHLORIDE 0.9 % IV SOLN: INTRAVENOUS | Status: DC

## 2013-11-02 MED ORDER — ASPIRIN EC 81 MG PO TBEC
81.0000 mg | DELAYED_RELEASE_TABLET | Freq: Every day | ORAL | Status: DC
  Administered 2013-11-02 – 2013-11-04 (×3): 81 mg via ORAL
  Filled 2013-11-02 (×3): qty 1

## 2013-11-02 MED ORDER — METOCLOPRAMIDE HCL 5 MG/ML IJ SOLN
10.0000 mg | Freq: Three times a day (TID) | INTRAMUSCULAR | Status: DC
  Administered 2013-11-02 – 2013-11-04 (×6): 10 mg via INTRAVENOUS
  Filled 2013-11-02 (×9): qty 2

## 2013-11-02 MED ORDER — KCL IN DEXTROSE-NACL 20-5-0.45 MEQ/L-%-% IV SOLN
INTRAVENOUS | Status: DC
  Administered 2013-11-02: 17:00:00 via INTRAVENOUS
  Filled 2013-11-02 (×4): qty 1000

## 2013-11-02 MED ORDER — SODIUM CHLORIDE 0.9 % IV BOLUS (SEPSIS)
1000.0000 mL | Freq: Once | INTRAVENOUS | Status: AC
  Administered 2013-11-02: 1000 mL via INTRAVENOUS

## 2013-11-02 MED ORDER — ONDANSETRON HCL 4 MG/2ML IJ SOLN: 4.0000 mg | Freq: Four times a day (QID) | INTRAMUSCULAR | Status: DC | PRN

## 2013-11-02 MED ORDER — INSULIN ASPART 100 UNIT/ML ~~LOC~~ SOLN
0.0000 [IU] | Freq: Three times a day (TID) | SUBCUTANEOUS | Status: DC
  Administered 2013-11-02: 8 [IU] via SUBCUTANEOUS

## 2013-11-02 MED ORDER — ONDANSETRON HCL 4 MG/2ML IJ SOLN: 4.0000 mg | Freq: Three times a day (TID) | INTRAMUSCULAR | Status: DC | PRN

## 2013-11-02 MED ORDER — INSULIN ASPART 100 UNIT/ML IV SOLN
8.0000 [IU] | Freq: Once | INTRAVENOUS | Status: AC
  Administered 2013-11-02: 8 [IU] via INTRAVENOUS
  Filled 2013-11-02 (×2): qty 0.08

## 2013-11-02 MED ORDER — RISPERIDONE 2 MG PO TABS
2.0000 mg | ORAL_TABLET | Freq: Every day | ORAL | Status: DC
  Administered 2013-11-02 – 2013-11-03 (×2): 2 mg via ORAL
  Filled 2013-11-02 (×3): qty 1

## 2013-11-02 MED ORDER — HYDROMORPHONE HCL PF 1 MG/ML IJ SOLN
1.0000 mg | Freq: Once | INTRAMUSCULAR | Status: AC
Start: 2013-11-02 — End: 2013-11-02
  Administered 2013-11-02: 1 mg via INTRAVENOUS
  Filled 2013-11-02: qty 1

## 2013-11-02 MED ORDER — SODIUM CHLORIDE 0.9 % IV SOLN
INTRAVENOUS | Status: DC
  Administered 2013-11-02: 13:00:00 via INTRAVENOUS

## 2013-11-02 MED ORDER — ONDANSETRON HCL 4 MG PO TABS: 4.0000 mg | ORAL_TABLET | Freq: Four times a day (QID) | ORAL | Status: DC | PRN

## 2013-11-02 MED ORDER — HYDROCODONE-ACETAMINOPHEN 5-325 MG PO TABS
1.0000 | ORAL_TABLET | ORAL | Status: DC | PRN
  Administered 2013-11-02 – 2013-11-03 (×2): 2 via ORAL
  Filled 2013-11-02 (×2): qty 2

## 2013-11-02 MED ORDER — HYDROMORPHONE HCL PF 1 MG/ML IJ SOLN: 1.0000 mg | INTRAMUSCULAR | Status: DC | PRN

## 2013-11-02 MED ORDER — ONDANSETRON HCL 4 MG/2ML IJ SOLN: 4.0000 mg | Freq: Three times a day (TID) | INTRAMUSCULAR | Status: AC | PRN

## 2013-11-02 MED ORDER — GABAPENTIN 300 MG PO CAPS
600.0000 mg | ORAL_CAPSULE | Freq: Three times a day (TID) | ORAL | Status: DC
  Administered 2013-11-02 – 2013-11-04 (×5): 600 mg via ORAL
  Filled 2013-11-02 (×8): qty 2

## 2013-11-02 NOTE — ED Notes (Signed)
Dr. Jeanell Sparrow obtained lab from right femoral artery stick.

## 2013-11-02 NOTE — ED Provider Notes (Signed)
Venous puncture of right femoral vein Area prepped and draped in usual fashion Right femoral artery identified by palpation, Right femoral vein accessed with 18 gauge needle and 20 cc dark blood c.w. Venous blood obtained. Pressure applied for ten minutes by rn  Shaune Pollack, MD 11/02/13 1005

## 2013-11-02 NOTE — ED Notes (Signed)
Pressure held post femoral stick x 10 minutes.

## 2013-11-02 NOTE — Progress Notes (Signed)
[facility] [place] Liaison provided pt with a list of primary care resources and a [employer] application to help patient establish primary care.

## 2013-11-02 NOTE — H&P (Addendum)
[facility] History and Physical  Mary [name] VOZ:[id number] DOB: 1970-08-19 DOA: 11/02/2013  Referring physician: Dr. Stark Jock PCP: Elbert Ewings, FNP   Chief Complaint: Nausea/Emesis and abdominal discomfort  HPI: Mary [name] is a 43 y.o. female  With history of diabetes mellitus on subcutaneous insulin and diabetic gastroparesis. She has had multiple hospital admissions for diabetic gastroparesis. The patient states she is not on any Reglan at home. She says that within the last 1 or 2 days she has had abdominal discomfort which has been persistent and progressively getting worse. Nothing she is aware of makes it better or worse as a result presented to the ED for further evaluation and recommendations.  While in the ED white blood cell count was within normal limits and vital signs stable. We were consulted for further admission recommendations given intractable nausea. Patient continued to complain of abdominal discomfort.   Review of Systems:  Constitutional:  No weight loss, night sweats, Fevers, chills, fatigue.  HEENT:  No headaches, Difficulty swallowing,Tooth/dental problems,Sore throat,  No sneezing, itching, ear ache, nasal congestion, post nasal drip,  Cardio-vascular:  No chest pain, Orthopnea, PND, swelling in lower extremities, anasarca, dizziness, palpitations  GI:  No heartburn, indigestion, + abdominal pain, + nausea and vomiting, diarrhea, change in bowel habits, loss of appetite  Resp:  No shortness of breath with exertion or at rest. No excess mucus, no productive cough, No non-productive cough, No coughing up of blood.No change in color of mucus.No wheezing.No chest wall deformity  Skin:  no rash or lesions.  GU:  no dysuria, change in color of urine, no urgency or frequency. No flank pain.  Musculoskeletal:  No joint pain or swelling. No decreased range of motion. No back pain.  Psych:  No change in mood or affect. No depression or anxiety. No  memory loss.   Past Medical History  Diagnosis Date  . Kidney stone   . Seizure disorder     started with pregnancy of first son  . Gastritis   . Bipolar 2 disorder   . Post traumatic stress disorder     "flipping out" after people close to her died  . Heart murmur   . Arthritis     r ankle-S/P surgery (2007)  . Anemia     childhood  . Depression     childhood  . Hepatitis C     biopsy in 2010-no rx-was supposed to see a hepatologist in [place].  With cirrhosis  . Neuropathy     in legs and feet and hands  . Cataracts, bilateral   . Scoliosis   . Diabetes mellitus     diagnosed in 1996-always been on insulin  . DKA (diabetic ketoacidoses)     Recurrent admissions for DKA, medication non-complaince, poor social situation  . Diabetic neuropathy, type I diabetes mellitus     numbness bilaterally feet  . HTN (hypertension) 10/12/2013   Past Surgical History  Procedure Laterality Date  . Ankle surgery  2007  . Endometrial biopsy  06/22/2012  . Cholecystectomy  04/07/2013  . Cholecystectomy N/A 04/07/2013    Procedure: LAPAROSCOPIC CHOLECYSTECTOMY WITH INTRAOPERATIVE CHOLANGIOGRAM;  Surgeon: Adin Hector, MD;  Location: [facility] ORS;  Service: General;  Laterality: N/A;  . Cesarean section      3 times   Social History:  reports that she has been smoking Cigarettes.  She has a 2.5 pack-year smoking history. She has never used smokeless tobacco. She reports that she uses illicit drugs (Marijuana) about  once per week. She reports that she does not drink alcohol.  Allergies  Allergen Reactions  . Penicillins Rash    Family History  Problem Relation Age of Onset  . Diabetes Mother     currently 67  . Fibromyalgia Mother   . Cirrhosis Father     died in 41  . Diabetes Maternal Grandmother   . Diabetes Maternal Aunt      Prior to Admission medications   Medication Sig Start Date End Date Taking? Authorizing Provider  aspirin EC 81 MG tablet Take 81 mg by mouth  daily.    Yes Historical Provider, MD  divalproex (DEPAKOTE) 250 MG DR tablet Take 1 tablet (250 mg total) by mouth every 12 (twelve) hours. 10/23/13  Yes Estela Leonie Green, MD  gabapentin (NEURONTIN) 400 MG capsule Take 2 capsules (800 mg total) by mouth 3 (three) times daily. 10/23/13  Yes Estela Leonie Green, MD  hyoscyamine (LEVSIN SL) 0.125 MG SL tablet Place 1 tablet (0.125 mg total) under the tongue 3 (three) times daily. 10/14/13  Yes Theodis Blaze, MD  insulin NPH-regular Human (NOVOLIN 70/30) (70-30) 100 UNIT/ML injection Inject 35 Units into the skin 2 (two) times daily with a meal. 10/23/13  Yes Estela Leonie Green, MD  meloxicam (MOBIC) 15 MG tablet Take 15 mg by mouth daily.    Yes Historical Provider, MD  metFORMIN (GLUCOPHAGE) 1000 MG tablet Take 1,000 mg by mouth 2 (two) times daily.   Yes Historical Provider, MD  metoCLOPramide (REGLAN) 10 MG tablet Take 1 tablet (10 mg total) by mouth 4 (four) times daily -  before meals and at bedtime. 10/23/13  Yes Erline Hau, MD  risperidone (RISPERDAL) 4 MG tablet Take 0.5 tablets (2 mg total) by mouth at bedtime. 10/23/13  Yes Estela Leonie Green, MD  INSULIN SYRINGE .5CC/28G 28G X 1/2" 0.5 ML MISC 1 Syringe by Does not apply route 2 (two) times daily. 10/23/13   Erline Hau, MD   Physical Exam: Filed Vitals:   11/02/13 1430  BP: 129/62  Pulse: 83  Temp:   Resp:     BP 129/62  Pulse 83  Temp(Src) 98.4 F (36.9 C) (Oral)  Resp 19  SpO2 99%  General:  Appears calm and comfortable Eyes: PERRL, normal lids, irises & conjunctiva ENT: grossly normal hearing, lips & tongue Neck: no LAD, masses or thyromegaly Cardiovascular: RRR, no m/r/g. No LE edema. Respiratory: CTA bilaterally, no w/r/r. Normal respiratory effort. Abdomen: soft, nd, generalized tenderness on palpation. Skin: no rash or induration seen on limited exam Musculoskeletal: grossly normal tone BUE/BLE Psychiatric: grossly  normal mood and affect, speech fluent and appropriate Neurologic: grossly non-focal.          Labs on Admission:  Basic Metabolic Panel:  Recent Labs Lab 11/02/13 0950  NA 140  K 4.6  CL 99  CO2 24  GLUCOSE 440*  BUN 14  CREATININE 0.54  CALCIUM 10.1   Liver Function Tests:  Recent Labs Lab 11/02/13 0950  AST 49*  ALT 45*  ALKPHOS 96  BILITOT 0.3  PROT 7.8  ALBUMIN 4.0    Recent Labs Lab 11/02/13 0950  LIPASE 26   No results found for this basename: AMMONIA,  in the last 168 hours CBC:  Recent Labs Lab 11/02/13 0950  WBC 9.9  NEUTROABS 7.8*  HGB 12.2  HCT 37.7  MCV 70.5*  PLT 190   Cardiac Enzymes: No results found for  this basename: CKTOTAL, CKMB, CKMBINDEX, TROPONINI,  in the last 168 hours  BNP (last 3 results) No results found for this basename: PROBNP,  in the last 8760 hours CBG:  Recent Labs Lab 11/02/13 0747 11/02/13 1213  GLUCAP 451* 338*    Radiological Exams on Admission: No results found.   Assessment/Plan Active Problems:   Diabetic gastroparesis - supportive therapy: antiemetics and pain control - clear liquid diet - Place on reglan  DM - SSI (moderate scale) with night coverage - plan on advancing diet to diabetic diet with improvement in condition.  Bipolar d/o - stable continue home regimen  Seizure d/o - no seizure like activity reported - continue home regimen.  DVT prophylaxis - heparin sq  Code Status: full Family Communication: No family at bedside Disposition Plan: Pending improvement in condition. Plan on d/c once patient tolerating oral intake.  Time spent: > 45 minutes  [facility] Pager [phone]  **Disclaimer: This note may have been dictated with voice recognition software. Similar sounding words can inadvertently be transcribed and this note may contain transcription errors which may not have been corrected upon publication of note.**

## 2013-11-02 NOTE — ED Notes (Signed)
Bed: TK35 Expected date:  Expected time:  Means of arrival:  Comments: EMS- chronic abdominal pain, emesis

## 2013-11-02 NOTE — Progress Notes (Signed)
UR completed 

## 2013-11-02 NOTE — ED Notes (Signed)
Per EMS, patient in bathtub vomiting.  Patient c/o severe abdominal pain.  Patient vomited x 4, denies diarrhea.  CBG 394.

## 2013-11-02 NOTE — ED Provider Notes (Signed)
CSN: [id number]     Arrival date & time 11/02/13  0745 History   First MD Initiated Contact with Patient 11/02/13 [phone]     Chief Complaint  Patient presents with  . Abdominal Pain  . Emesis     (Consider location/radiation/quality/duration/timing/severity/associated sxs/prior Treatment) HPI Comments: Patient is a 43 year old female with history of diabetes, gastroparesis, bipolar disorder. She presents with complaints of severe abdominal pain and vomiting that started earlier today. She denies any diarrhea or constipation. She denies any fevers or chills. She has been here multiple times and admitted twice recently for similar complaints.  Patient is a 43 y.o. female presenting with abdominal pain. The history is provided by the patient.  Abdominal Pain Pain location:  Generalized Pain quality: cramping   Pain radiates to:  Does not radiate Pain severity:  Severe Onset quality:  Sudden Duration:  2 hours Timing:  Constant Progression:  Worsening Chronicity:  New Relieved by:  Nothing Worsened by:  Nothing tried Ineffective treatments:  None tried   Past Medical History  Diagnosis Date  . Kidney stone   . Seizure disorder     started with pregnancy of first son  . Gastritis   . Bipolar 2 disorder   . Post traumatic stress disorder     "flipping out" after people close to her died  . Heart murmur   . Arthritis     r ankle-S/P surgery (2007)  . Anemia     childhood  . Depression     childhood  . Hepatitis C     biopsy in 2010-no rx-was supposed to see a hepatologist in [place].  With cirrhosis  . Neuropathy     in legs and feet and hands  . Cataracts, bilateral   . Scoliosis   . Diabetes mellitus     diagnosed in 1996-always been on insulin  . DKA (diabetic ketoacidoses)     Recurrent admissions for DKA, medication non-complaince, poor social situation  . Diabetic neuropathy, type I diabetes mellitus     numbness bilaterally feet  . HTN (hypertension)  10/12/2013   Past Surgical History  Procedure Laterality Date  . Ankle surgery  2007  . Endometrial biopsy  06/22/2012  . Cholecystectomy  04/07/2013  . Cholecystectomy N/A 04/07/2013    Procedure: LAPAROSCOPIC CHOLECYSTECTOMY WITH INTRAOPERATIVE CHOLANGIOGRAM;  Surgeon: Adin Hector, MD;  Location: [facility];  Service: General;  Laterality: N/A;  . Cesarean section      3 times   Family History  Problem Relation Age of Onset  . Diabetes Mother     currently 44  . Fibromyalgia Mother   . Cirrhosis Father     died in 53  . Diabetes Maternal Grandmother   . Diabetes Maternal Aunt    History  Substance Use Topics  . Smoking status: Current Every Day Smoker -- 0.10 packs/day for 25 years    Types: Cigarettes  . Smokeless tobacco: Never Used  . Alcohol Use: No     Comment: Endorses hasn't been drinking   OB History   Grav Para Term Preterm Abortions TAB SAB Ect Mult Living   4 3 3  0 1 0 1 0 1 2     Review of Systems  Gastrointestinal: Positive for abdominal pain.  All other systems reviewed and are negative.     Allergies  Penicillins  Home Medications   Prior to Admission medications   Medication Sig Start Date End Date Taking? Authorizing Provider  aspirin EC 81 MG  tablet Take 81 mg by mouth daily.     Historical Provider, MD  diphenhydrAMINE (BENADRYL) 25 MG tablet Take 25 mg by mouth every 4 (four) hours as needed for itching. Take with reglan as needed 04/21/13   Kalman Drape, MD  divalproex (DEPAKOTE) 250 MG DR tablet Take 1 tablet (250 mg total) by mouth every 12 (twelve) hours. 10/23/13   Erline Hau, MD  gabapentin (NEURONTIN) 400 MG capsule Take 2 capsules (800 mg total) by mouth 3 (three) times daily. 10/23/13   Erline Hau, MD  hyoscyamine (LEVSIN SL) 0.125 MG SL tablet Place 1 tablet (0.125 mg total) under the tongue 3 (three) times daily. 10/14/13   Theodis Blaze, MD  insulin NPH-regular Human (NOVOLIN 70/30) (70-30) 100 UNIT/ML  injection Inject 35 Units into the skin 2 (two) times daily with a meal. 10/23/13   Erline Hau, MD  INSULIN SYRINGE .5CC/28G 28G X 1/2" 0.5 ML MISC 1 Syringe by Does not apply route 2 (two) times daily. 10/23/13   Erline Hau, MD  medroxyPROGESTERone (PROVERA) 10 MG tablet Take 2 tablets (20 mg total) by mouth daily. 04/15/13   Woodroe Mode, MD  meloxicam (MOBIC) 15 MG tablet Take 15 mg by mouth daily.     Historical Provider, MD  metFORMIN (GLUCOPHAGE) 1000 MG tablet Take 1,000 mg by mouth 2 (two) times daily.    Historical Provider, MD  metoCLOPramide (REGLAN) 10 MG tablet Take 1 tablet (10 mg total) by mouth 4 (four) times daily -  before meals and at bedtime. 10/23/13   Erline Hau, MD  oxyCODONE-acetaminophen (PERCOCET/ROXICET) 5-325 MG per tablet Take 1 tablet by mouth every 8 (eight) hours as needed for severe pain. 10/14/13   Theodis Blaze, MD  risperidone (RISPERDAL) 4 MG tablet Take 0.5 tablets (2 mg total) by mouth at bedtime. 10/23/13   Erline Hau, MD   BP 171/124  Pulse 98  Temp(Src) 98.4 F (36.9 C) (Oral)  Resp 20  SpO2 99% Physical Exam  Nursing note and vitals reviewed. Constitutional: She is oriented to person, place, and time. She appears well-developed and well-nourished.  Patient appears very anxious and uncomfortable. She is moaning.  HENT:  Head: Normocephalic and atraumatic.  Mouth/Throat: Oropharynx is clear and moist.  Neck: Normal range of motion. Neck supple.  Cardiovascular: Normal rate and regular rhythm.  Exam reveals no gallop and no friction rub.   No murmur heard. Pulmonary/Chest: Effort normal and breath sounds normal. No respiratory distress. She has no wheezes.  Abdominal: Soft. Bowel sounds are normal. She exhibits no distension. There is tenderness.  There is tenderness to palpation in all 4 quadrants. There is no rebound and no guarding.  Musculoskeletal: Normal range of motion.  Neurological: She  is alert and oriented to person, place, and time.  Skin: Skin is warm and dry.    ED Course  Procedures (including critical care time) Labs Review Labs Reviewed  CBG MONITORING, ED - Abnormal; Notable for the following:    Glucose-Capillary 451 (*)    All other components within normal limits  CBC WITH DIFFERENTIAL  COMPREHENSIVE METABOLIC PANEL  LACTIC ACID, PLASMA  LIPASE, BLOOD    Imaging Review No results found.   EKG Interpretation None      MDM   Final diagnoses:  None    Patient is a 43 year old female with history of diabetic gastroparesis. She's been admitted multiple times for similar  complaints. She presents today with severe nausea, vomiting, and abdominal pain. Workup reveals an elevated sugar of 440 but no evidence for DKA. Her CBC is essentially unremarkable and lipase and lactate are normal. She was given repeat doses of anti-medics and analgesics, however is not feeling any better. It appears as though she will require admission again. I've spoken with Dr. Wendee Beavers who agrees to admit.   Veryl Speak, MD 11/02/13 1315

## 2013-11-03 DIAGNOSIS — K3184 Gastroparesis: Secondary | ICD-10-CM | POA: Diagnosis not present

## 2013-11-03 LAB — BASIC METABOLIC PANEL
BUN: 8 mg/dL (ref 6–23)
CO2: 21 meq/L (ref 19–32)
Calcium: 8.9 mg/dL (ref 8.4–10.5)
Chloride: 100 mEq/L (ref 96–112)
Creatinine, Ser: 0.47 mg/dL — ABNORMAL LOW (ref 0.50–1.10)
GFR calc Af Amer: >90 mL/min (ref >90)
GLUCOSE: 363 mg/dL — AB (ref 70–99)
POTASSIUM: 4.9 meq/L (ref 3.7–5.3)
SODIUM: 135 meq/L — AB (ref 137–147)

## 2013-11-03 LAB — GLUCOSE, CAPILLARY
GLUCOSE-CAPILLARY: 373 mg/dL — AB (ref 70–99)
Glucose-Capillary: 386 mg/dL — ABNORMAL HIGH (ref 70–99)
Glucose-Capillary: 284 mg/dL — ABNORMAL HIGH (ref 70–99)
Glucose-Capillary: 72 mg/dL (ref 70–99)

## 2013-11-03 LAB — CBC
HCT: 34.6 % — ABNORMAL LOW (ref 36.0–46.0)
Hemoglobin: 10.6 g/dL — ABNORMAL LOW (ref 12.0–15.0)
MCH: 22.2 pg — ABNORMAL LOW (ref 26.0–34.0)
MCHC: 30.6 g/dL (ref 30.0–36.0)
MCV: 72.4 fL — ABNORMAL LOW (ref 78.0–100.0)
PLATELETS: 177 10*3/uL (ref 150–400)
RBC: 4.78 MIL/uL (ref 3.87–5.11)
RDW: 15.1 % (ref 11.5–15.5)
WBC: 12.9 10*3/uL — AB (ref 4.0–10.5)

## 2013-11-03 MED ORDER — SODIUM CHLORIDE 0.9 % IV SOLN
INTRAVENOUS | Status: DC
  Administered 2013-11-03 – 2013-11-04 (×2): via INTRAVENOUS

## 2013-11-03 MED ORDER — INSULIN ASPART PROT & ASPART (70-30 MIX) 100 UNIT/ML ~~LOC~~ SUSP
30.0000 [IU] | Freq: Two times a day (BID) | SUBCUTANEOUS | Status: DC
  Administered 2013-11-03 – 2013-11-04 (×3): 30 [IU] via SUBCUTANEOUS
  Filled 2013-11-03: qty 10

## 2013-11-03 MED ORDER — INSULIN ASPART 100 UNIT/ML ~~LOC~~ SOLN
0.0000 [IU] | Freq: Three times a day (TID) | SUBCUTANEOUS | Status: DC
  Administered 2013-11-03: 8 [IU] via SUBCUTANEOUS
  Administered 2013-11-04: 5 [IU] via SUBCUTANEOUS

## 2013-11-03 MED ORDER — BOOST / RESOURCE BREEZE PO LIQD
1.0000 | Freq: Two times a day (BID) | ORAL | Status: DC
  Administered 2013-11-03 – 2013-11-04 (×2): 1 via ORAL

## 2013-11-03 NOTE — Progress Notes (Signed)
PROGRESS NOTE  Mary [name]:[id number] DOB: 18-Mar-1971 DOA: 11/02/2013 PCP: Elbert Ewings, FNP  HPI: With history of diabetes mellitus on subcutaneous insulin and diabetic gastroparesis. She has had multiple hospital admissions for diabetic gastroparesis. The patient states she is not on any Reglan at home. She says that within the last 1 or 2 days she has had abdominal discomfort which has been persistent and progressively getting worse. Nothing she is aware of makes it better or worse as a result presented to the ED for further evaluation and recommendations. While in the ED white blood cell count was within normal limits and vital signs stable. We were consulted for further admission recommendations given intractable nausea. Patient continued to complain of abdominal discomfort.  Assessment/Plan:   Diabetic gastroparesis  - supportive therapy: antiemetics and pain control  - clear liquid diet, advance as tolerated - Placed on reglan  DM - SSI (moderate scale) with night coverage  - plan on advancing diet to diabetic diet with improvement in condition.  Bipolar d/o - stable continue home regimen  Seizure d/o - no seizure like activity reported  - continue home regimen.    Diet: clears, advance as tolerated Fluids: NS DVT Prophylaxis: heparin  Code Status: Full Family Communication: d/w patient  Disposition Plan: inpatient, home when ready   Consultants:  None   Procedures:  None    Antibiotics  Anti-infectives   None     Antibiotics Given (last 72 hours)   None     HPI/Subjective: Feeling a bit better, had nothing for dinner.   Objective: Filed Vitals:   11/02/13 1832 11/02/13 2130 11/03/13 0544 11/03/13 0900  BP:  128/73 126/77 128/72  Pulse:  91 91 90  Temp:  98.8 F (37.1 C) 98.3 F (36.8 C) 98.2 F (36.8 C)  TempSrc:  Oral Oral Oral  Resp:  18 18 18   Height:      Weight: 54.8 kg (120 lb 13 oz)     SpO2:  99% 89% 91%    Intake/Output  Summary (Last 24 hours) at 11/03/13 1341 Last data filed at 11/03/13 0641  Gross per 24 hour  Intake 2196.78 ml  Output    801 ml  Net 1395.78 ml   Filed Weights   11/02/13 1832  Weight: 54.8 kg (120 lb 13 oz)   Exam:  General:  NAD  Cardiovascular: regular rate and rhythm, without MRG  Respiratory: good air movement, clear to auscultation throughout, no wheezing, ronchi or rales  Abdomen: soft, not tender to palpation, positive bowel sounds  MSK: no peripheral edema  Neuro: non focal  Data Reviewed: Basic Metabolic Panel:  Recent Labs Lab 11/02/13 0850 11/02/13 0950 11/03/13 0441  NA  --  140 135*  K  --  4.6 4.9  CL  --  99 100  CO2  --  24 21  GLUCOSE  --  440* 363*  BUN  --  14 8  CREATININE  --  0.54 0.47*  CALCIUM  --  10.1 8.9  MG 2.0  --   --   PHOS 3.9  --   --    Liver Function Tests:  Recent Labs Lab 11/02/13 0950  AST 49*  ALT 45*  ALKPHOS 96  BILITOT 0.3  PROT 7.8  ALBUMIN 4.0    Recent Labs Lab 11/02/13 0950  LIPASE 26   CBC:  Recent Labs Lab 11/02/13 0950 11/03/13 0525  WBC 9.9 12.9*  NEUTROABS 7.8*  --   HGB  12.2 10.6*  HCT 37.7 34.6*  MCV 70.5* 72.4*  PLT 190 177   CBG:  Recent Labs Lab 11/02/13 1213 11/02/13 1715 11/02/13 2109 11/03/13 0805 11/03/13 1154  GLUCAP 338* 283* 292* 373* 386*   Studies: No results found.  Scheduled Meds: . aspirin EC  81 mg Oral Daily  . divalproex  250 mg Oral Q12H  . feeding supplement (RESOURCE BREEZE)  1 Container Oral BID BM  . gabapentin  600 mg Oral TID  . heparin  5,000 Units Subcutaneous 3 times per day  . insulin aspart  0-15 Units Subcutaneous TID WC  . insulin aspart  0-5 Units Subcutaneous QHS  . insulin aspart protamine- aspart  30 Units Subcutaneous BID WC  . metoCLOPramide (REGLAN) injection  10 mg Intravenous 3 times per day  . risperidone  2 mg Oral QHS   Continuous Infusions: . dextrose 5 % and 0.45 % NaCl with KCl 20 mEq/L 100 mL/hr at 11/02/13 1639      Active Problems:   Non compliance w medication regimen   Tobacco abuse   DM (diabetes mellitus), type 1, uncontrolled   Chronic abdominal pain   Gastroparesis   Diabetic gastroparesis   Time spent: 25  This note has been created with Surveyor, quantity. Any transcriptional errors are unintentional.   Marzetta Board, MD [facility] Pager [phone]. If 7 PM - 7 AM, please contact night-coverage at [email or web address], password [facility] 11/03/2013, 1:41 PM  LOS: 1 day

## 2013-11-03 NOTE — Progress Notes (Addendum)
Clinical Social Work Department BRIEF PSYCHOSOCIAL ASSESSMENT 11/03/2013  Patient:  Mary [name], Mary [name]     Account Number:  [id number]     Admit date:  11/02/2013  Clinical Social Worker:  Earlie Server  Date/Time:  11/03/2013 04:00 PM  Referred by:  Physician  Date Referred:  11/03/2013 Referred for  Homelessness   Other Referral:   Interview type:  Patient Other interview type:    PSYCHOSOCIAL DATA Living Status:  FACILITY Admitted from facility:  [facility] Level of care:   Primary support name:  Alice Primary support relationship to patient:  PARENT Degree of support available:   Adequate    CURRENT CONCERNS Current Concerns  Post-Acute Placement   Other Concerns:    SOCIAL WORK ASSESSMENT / PLAN CSW received referral due to patient being homeless. CSW reviewed chart and met with patient at bedside. CSW introduced myself and explained role.    Patient reports that she is homeless but is currently living at [facility]. Patient reports that she is currently seeking permanent housing and has plans to go and tour an apartment. Patient reports she is working with [facility] in order to get medical follow up. Patient already has food stamps and reports she is talking with Department of Social Services re: Medicaid. Patient has a hearing for social security disability as well. Patient reports she is stressed about environmental problems but that she is strong and working to make her life better. Patient's parents live nearby but reports she does not want to burden them. Patient has supportive friends as well. Patient reports she takes public transportation in order to get to appointments and does not feel she needs CSW assistance at this time.    CSW is signing off but available if needed.   Assessment/plan status:  No Further Intervention Required Other assessment/ plan:   Information/referral to community resources:   Patient politely declined all resources     PATIENT'S/FAMILY'S RESPONSE TO PLAN OF CARE: Patient alert and oriented. Patient reports she feels depressed often re: chronic medical concerns and is hopeful to feel better soon. Patient reports that she is knowledgeable about community resources and is hopeful that she will soon have her own place which will reduce her stress. Patient thanked CSW for visit but reports she does not need any assistance at this time.       [place], [place] [phone]

## 2013-11-03 NOTE — Progress Notes (Signed)
INITIAL NUTRITION ASSESSMENT  DOCUMENTATION CODES Per approved criteria  -Not Applicable   INTERVENTION: - Resource Breeze BID - Diet advancement per MD - Educated pt on gastroparesis diet  - RD to continue to monitor   NUTRITION DIAGNOSIS: Inadequate oral intake related to clear liquid diet as evidenced by diet order.    Goal: Advance diet as tolerated to diabetic diet  Monitor:  Weights, labs, diet advancement   Reason for Assessment: Malnutrition screening tool   43 y.o. female  Admitting Dx: Nausea, vomiting, and abdominal discomfort   ASSESSMENT: With history of diabetes mellitus on subcutaneous insulin and diabetic gastroparesis. She has had multiple hospital admissions for diabetic gastroparesis. The patient states she is not on any Reglan at home. She says that within the last 1 or 2 days she has had abdominal discomfort which has been persistent and progressively getting worse. Nothing she is aware of makes it better or worse as a result presented to the ED for further evaluation and recommendations.  -Discussed pt with diabetic coordinator -Met with pt who reports eating 2 meals/day at home, does not eat breakfast -Usual intake consists of beef, pork, chicken and some potatoes, rice, or bread -Weight up 5 pounds in the past month -States she has been trying to limit her portion sizes at mealtimes -Reports blood sugars at home have been in the 200s mg/dL when she doesn't take her insulin but reports she has been taking it regularly -States vomiting started yesterday morning, denies any vomiting today -Reports consuming 100% of clear liquid tray this morning -Working on clear liquid lunch, did not perform nutrition focused physical exam -Educated pt on diet for gastroparesis and pt appear surprised to learn that greasy/fatty foods are not good for having gastroparesis and admits to eating a lot of greasy foods recently  Lab Results  Component Value Date   HGBA1C  13.0* 10/13/2013   AST/ALT elevated     Height: Ht Readings from Last 1 Encounters:  11/02/13 $RemoveB'5\' 7"'tNVwWrej$  (1.702 m)    Weight: Wt Readings from Last 1 Encounters:  11/02/13 120 lb 13 oz (54.8 kg)    Ideal Body Weight: 135 lbs   % Ideal Body Weight: 89%  Wt Readings from Last 10 Encounters:  11/02/13 120 lb 13 oz (54.8 kg)  10/22/13 123 lb 10.9 oz (56.1 kg)  10/12/13 115 lb (52.164 kg)  04/29/13 130 lb 12.8 oz (59.33 kg)  04/15/13 126 lb (57.153 kg)  04/07/13 125 lb (56.7 kg)  04/07/13 125 lb (56.7 kg)  04/02/13 124 lb 11.2 oz (56.564 kg)  03/13/13 123 lb 4.8 oz (55.929 kg)  01/07/13 116 lb 2.9 oz (52.7 kg)    Usual Body Weight: 115 lbs last month  % Usual Body Weight: 104%  BMI:  Body mass index is 18.92 kg/(m^2).  Estimated Nutritional Needs: Kcal: 1650-1850 Protein: 70-90g Fluid: 1.6-1.8L/day   Skin: Intact   Diet Order: Clear Liquid  EDUCATION NEEDS: -Education needs addressed - discussed gastroparesis diet, provided handouts and teach back method used, expect fair compliance, RD contact information provided    Intake/Output Summary (Last 24 hours) at 11/03/13 1201 Last data filed at 11/03/13 0641  Gross per 24 hour  Intake 2196.78 ml  Output    801 ml  Net 1395.78 ml    Last BM: 5/16  Labs:   Recent Labs Lab 11/02/13 0850 11/02/13 0950 11/03/13 0441  NA  --  140 135*  K  --  4.6 4.9  CL  --  99 100  CO2  --  24 21  BUN  --  14 8  CREATININE  --  0.54 0.47*  CALCIUM  --  10.1 8.9  MG 2.0  --   --   PHOS 3.9  --   --   GLUCOSE  --  440* 363*    CBG (last 3)   Recent Labs  11/02/13 1715 11/02/13 2109 11/03/13 0805  GLUCAP 283* 292* 373*    Scheduled Meds: . aspirin EC  81 mg Oral Daily  . divalproex  250 mg Oral Q12H  . gabapentin  600 mg Oral TID  . heparin  5,000 Units Subcutaneous 3 times per day  . insulin aspart  0-5 Units Subcutaneous QHS  . insulin aspart protamine- aspart  30 Units Subcutaneous BID WC  .  metoCLOPramide (REGLAN) injection  10 mg Intravenous 3 times per day  . risperidone  2 mg Oral QHS    Continuous Infusions: . dextrose 5 % and 0.45 % NaCl with KCl 20 mEq/L 100 mL/hr at 11/02/13 1639    Past Medical History  Diagnosis Date  . Kidney stone   . Seizure disorder     started with pregnancy of first son  . Gastritis   . Bipolar 2 disorder   . Post traumatic stress disorder     "flipping out" after people close to her died  . Heart murmur   . Arthritis     r ankle-S/P surgery (2007)  . Anemia     childhood  . Depression     childhood  . Hepatitis C     biopsy in 2010-no rx-was supposed to see a hepatologist in [place].  With cirrhosis  . Neuropathy     in legs and feet and hands  . Cataracts, bilateral   . Scoliosis   . Diabetes mellitus     diagnosed in 1996-always been on insulin  . DKA (diabetic ketoacidoses)     Recurrent admissions for DKA, medication non-complaince, poor social situation  . Diabetic neuropathy, type I diabetes mellitus     numbness bilaterally feet  . HTN (hypertension) 10/12/2013    Past Surgical History  Procedure Laterality Date  . Ankle surgery  2007  . Endometrial biopsy  06/22/2012  . Cholecystectomy  04/07/2013  . Cholecystectomy N/A 04/07/2013    Procedure: LAPAROSCOPIC CHOLECYSTECTOMY WITH INTRAOPERATIVE CHOLANGIOGRAM;  Surgeon: Adin Hector, MD;  Location: [facility];  Service: General;  Laterality: N/A;  . Cesarean section      3 times    Mikey College MS, [place], [place] Pager [phone] After Hours Pager

## 2013-11-03 NOTE — Care Management Note (Unsigned)
    Page 1 of 1   11/04/2013     11:29:53 AM CARE MANAGEMENT NOTE 11/04/2013  Patient:  Mary [name], Mary [name]   Account Number:  [id number]  Date Initiated:  11/03/2013  Documentation initiated by:  [facility]  Subjective/Objective Assessment:   43 year old female admitted with diabetic gastroparesis.     Action/Plan:   Lives at [facility].   Anticipated DC Date:  11/06/2013   Anticipated DC Plan:  [name]  CM consult      Choice offered to / List presented to:             Status of service:  In process, will continue to follow Medicare Important Message given?   (If response is "NO", the following Medicare IM given date fields will be blank) Date Medicare IM given:   Date Additional Medicare IM given:    Discharge Disposition:    Per UR Regulation:  Reviewed for med. necessity/level of care/duration of stay  If discussed at [place] of Stay Meetings, dates discussed:    Comments:  11/04/13 Allene Dillon RN BSN [phone] I did not get a response back from [facility] and hence I left another VM this AM. I met with pt to discuss this. She stated that since she has always been seen there for her healthcare and als received her prescriptions there, she plans on going there once she is discharged from the hospital. She does have my phone # in case I can assist her further. She asked for a bus pass and I have made the CSW aware of her request.   11/03/13 Allene Dillon RN BSN [phone] Pt has gone to [facility] in the past for her healthcare. She is trying to re-establish care there. She called to get an appt scheduled but was informed they need records form the hospital about her last visit. She stated she signed a release form and it as faxed to the medical records dept. I have called and left a msg with the coordinator at [facility] so see what I can do to assist her in getting an appt.

## 2013-11-03 NOTE — Progress Notes (Signed)
Inpatient Diabetes Program Recommendations  AACE/ADA: New Consensus Statement on Inpatient Glycemic Control (2013)  Target Ranges:  Prepandial:   less than 140 mg/dL      Peak postprandial:   less than 180 mg/dL (1-2 hours)      Critically ill patients:  140 - 180 mg/dL   Reason for Visit: Hyperglycemia  Diabetes history: DM1 Outpatient Diabetes medications: Novolin 70/30 35 units bid, metformin 1000 mg bid Current orders for Inpatient glycemic control: Novolog 70/30 30 units bid, HS correction  Results for Mary [name], Mary [name] (MRN [id number]) as of 11/03/2013 12:45  Ref. Range 11/03/2013 08:05  Glucose-Capillary Latest Range: 70-99 mg/dL 373 (H)   Needs Novolog sensitive tidwc and hs 70/30 insulin restarted this am.  RN to call MD regarding addition of Novolog.    Note: Will continue to follow.  Thank you. Lorenda Peck, RD, LDN, CDE Inpatient Diabetes Coordinator [phone]

## 2013-11-04 DIAGNOSIS — K3184 Gastroparesis: Secondary | ICD-10-CM | POA: Diagnosis not present

## 2013-11-04 LAB — GLUCOSE, CAPILLARY: Glucose-Capillary: 246 mg/dL — ABNORMAL HIGH (ref 70–99)

## 2013-11-04 MED ORDER — OXYCODONE-ACETAMINOPHEN 5-325 MG PO TABS: 1.0000 | ORAL_TABLET | Freq: Three times a day (TID) | ORAL | Status: DC | PRN

## 2013-11-04 MED ORDER — METOCLOPRAMIDE HCL 10 MG PO TABS: 10.0000 mg | ORAL_TABLET | Freq: Three times a day (TID) | ORAL | Status: DC

## 2013-11-04 MED ORDER — INSULIN NPH ISOPHANE & REGULAR (70-30) 100 UNIT/ML ~~LOC~~ SUSP: 35.0000 [IU] | Freq: Two times a day (BID) | SUBCUTANEOUS | Status: DC

## 2013-11-04 NOTE — Progress Notes (Signed)
Inpatient Diabetes Program Recommendations  AACE/ADA: New Consensus Statement on Inpatient Glycemic Control (2013)  Target Ranges:  Prepandial:   less than 140 mg/dL      Peak postprandial:   less than 180 mg/dL (1-2 hours)      Critically ill patients:  140 - 180 mg/dL   Reason for Visit: Hyperglycemia  Results for WAVA, KILDOW (MRN [id number]) as of 11/04/2013 10:28  Ref. Range 11/03/2013 08:05 11/03/2013 11:54 11/03/2013 16:25 11/03/2013 21:40 11/04/2013 07:47  Glucose-Capillary Latest Range: 70-99 mg/dL 373 (H) 386 (H) 284 (H) 72 246 (H)   Hyperglycemia with meals. Blood sugar dropped to 72 last night d/t large dose of Novolog correction, although pt ate 100% meal.  Recommendations: Decrease Novolog to sensitive tidwc and hs. Increase 70/30 to 32 units bid.  Will continue to follow. Thank you. Lorenda Peck, RD, LDN, CDE Inpatient Diabetes Coordinator [phone]

## 2013-11-04 NOTE — Discharge Instructions (Signed)
You were cared for by a hospitalist during your hospital stay. If you have any questions about your discharge medications or the care you received while you were in the hospital after you are discharged, you can call the unit and asked to speak with the hospitalist on call if the hospitalist that took care of you is not available. Once you are discharged, your primary care physician will handle any further medical issues. Please note that NO REFILLS for any discharge medications will be authorized once you are discharged, as it is imperative that you return to your primary care physician (or establish a relationship with a primary care physician if you do not have one) for your aftercare needs so that they can reassess your need for medications and monitor your lab values. °  °  °If you do not have a primary care physician, you can call [phone] for a physician referral. ° °Follow with Primary MD STEELE,ANTHONY, FNP in 5-7 days  ° °Get CBC, CMP checked by your doctor and again as further instructed.  °Get a 2 view Chest X ray done next visit if you had Pneumonia of Lung problems at the Hospital. ° °Get Medicines reviewed and adjusted. ° °Please request your Prim.MD to go over all Hospital Tests and Procedure/Radiological results at the follow up, please get all Hospital records sent to your Prim MD by signing hospital release before you go home. ° °Activity: As tolerated with Full fall precautions use walker/cane & assistance as needed ° °Diet: diabetic ° °For Heart failure patients - Check your Weight same time everyday, if you gain over 2 pounds, or you develop in leg swelling, experience more shortness of breath or chest pain, call your Primary MD immediately. Follow Cardiac Low Salt Diet and 1.8 lit/day fluid restriction. ° °Disposition Home  ° °If you experience worsening of your admission symptoms, develop shortness of breath, life threatening emergency, suicidal or homicidal thoughts you must seek medical  attention immediately by calling 911 or calling your MD immediately  if symptoms less severe. ° °You Must read complete instructions/literature along with all the possible adverse reactions/side effects for all the Medicines you take and that have been prescribed to you. Take any new Medicines after you have completely understood and accpet all the possible adverse reactions/side effects.  ° °Do not drive and provide baby sitting services if your were admitted for syncope or siezures until you have seen by Primary MD or a Neurologist and advised to do so again. ° °Do not drive when taking Pain medications.  ° °Do not take more than prescribed Pain, Sleep and Anxiety Medications ° °Special Instructions: If you have smoked or chewed Tobacco  in the last 2 yrs please stop smoking, stop any regular Alcohol  and or any Recreational drug use. ° °Wear Seat belts while driving. ° °

## 2013-11-04 NOTE — Progress Notes (Signed)
Clinical Social Work  CSW was informed patient was to DC today and needed a bus pass. CSW provided bus pass and patient reports no further needs. CSW alerted RN that patient has bus pass for DC.  CSW is signing off but available if further needs arise.  [place], [place] [phone]

## 2013-11-04 NOTE — Discharge Summary (Signed)
Physician Discharge Summary  Mary [name] YQI:[id number] DOB: 05-Apr-1971 DOA: 11/02/2013  PCP: Elbert Ewings, FNP  Admit date: 11/02/2013 Discharge date: 11/04/2013  Time spent: 35 minutes  Recommendations for Outpatient Follow-up:  1. Follow up with PCP in 1-2 weeks   Recommendations for primary care physician for things to follow:  Monitor CBGs  Discharge Diagnoses:  Active Problems:   Non compliance w medication regimen   Tobacco abuse   DM (diabetes mellitus), type 1, uncontrolled   Chronic abdominal pain   Gastroparesis   Diabetic gastroparesis  Discharge Condition: stable  Diet recommendation: diabetic   Filed Weights   11/02/13 1832  Weight: 54.8 kg (120 lb 13 oz)   History of present illness:  Mary [name] is a 43 y.o. female With history of diabetes mellitus on subcutaneous insulin and diabetic gastroparesis. She has had multiple hospital admissions for diabetic gastroparesis. The patient states she is not on any Reglan at home. She says that within the last 1 or 2 days she has had abdominal discomfort which has been persistent and progressively getting worse. Nothing she is aware of makes it better or worse as a result presented to the ED for further evaluation and recommendations. While in the ED white blood cell count was within normal limits and vital signs stable. We were consulted for further admission recommendations given intractable nausea. Patient continued to complain of abdominal discomfort.  Hospital Course:  Diabetic gastroparesis - supportive therapy: antiemetics and pain control, patient's pain has improved significantly and patient able to tolerate a regular diet without pain, nausea or vomiting. She was counseled regarding multiple small meals throughout the day as a general approach to minimize her flare-ups. Patient has had multiple hospitalization with same and is a high risk for readmission. Advised compliance with her Reglan.  DM - resume  previous home regimen.  Bipolar d/o - stable continue home regimen. She saw psych 2 weeks ago when she was hospitalized.  Seizure d/o - no seizure like activity reported   Procedures:  None   Consultations:  SW - patient lives in a shelter.   Discharge Exam: Filed Vitals:   11/03/13 0544 11/03/13 0900 11/03/13 1442 11/03/13 2142  BP: 126/77 128/72 130/80 125/69  Pulse: 91 90 85 88  Temp: 98.3 F (36.8 C) 98.2 F (36.8 C) 97.7 F (36.5 C) 98.4 F (36.9 C)  TempSrc: Oral Oral Oral Oral  Resp: 18 18 20 20   Height:      Weight:      SpO2: 89% 91% 99% 98%   General: NAD Cardiovascular: RRR Respiratory: CTA biL  Discharge Instructions    Medication List         aspirin EC 81 MG tablet  Take 81 mg by mouth daily.     divalproex 250 MG DR tablet  Commonly known as:  DEPAKOTE  Take 1 tablet (250 mg total) by mouth every 12 (twelve) hours.     gabapentin 400 MG capsule  Commonly known as:  NEURONTIN  Take 2 capsules (800 mg total) by mouth 3 (three) times daily.     hyoscyamine 0.125 MG SL tablet  Commonly known as:  LEVSIN SL  Place 1 tablet (0.125 mg total) under the tongue 3 (three) times daily.     insulin NPH-regular Human (70-30) 100 UNIT/ML injection  Commonly known as:  NOVOLIN 70/30  Inject 35 Units into the skin 2 (two) times daily with a meal.     INSULIN SYRINGE .5CC/28G 28G  X 1/2" 0.5 ML Misc  1 Syringe by Does not apply route 2 (two) times daily.     meloxicam 15 MG tablet  Commonly known as:  MOBIC  Take 15 mg by mouth daily.     metFORMIN 1000 MG tablet  Commonly known as:  GLUCOPHAGE  Take 1,000 mg by mouth 2 (two) times daily.     metoCLOPramide 10 MG tablet  Commonly known as:  REGLAN  Take 1 tablet (10 mg total) by mouth 4 (four) times daily -  before meals and at bedtime.     oxyCODONE-acetaminophen 5-325 MG per tablet  Commonly known as:  PERCOCET/ROXICET  Take 1-2 tablets by mouth every 8 (eight) hours as needed for severe pain.       risperidone 4 MG tablet  Commonly known as:  RISPERDAL  Take 0.5 tablets (2 mg total) by mouth at bedtime.       Follow-up Information   Follow up with [place] [place]. Schedule an appointment as soon as possible for a visit in 2 weeks.   Specialty:  Nurse Practitioner   Contact information:   [address] [place] [address] [phone]       The results of significant diagnostics from this hospitalization (including imaging, microbiology, ancillary and laboratory) are listed below for reference.    Significant Diagnostic Studies: Dg Abd Acute W/chest  10/21/2013   CLINICAL DATA:  43 year old female mid abdominal pain nausea and vomiting. Initial encounter. Hepatitis-C.  EXAM: ACUTE ABDOMEN SERIES (ABDOMEN 2 VIEW & CHEST 1 VIEW)  COMPARISON:  10/12/2013 and earlier.  FINDINGS: AP upright view of the chest. Mildly lower lung volumes. No pneumothorax or pneumoperitoneum. Stable mild attenuation bronchovascular markings. Normal cardiac size and mediastinal contours. Visualized tracheal air column is within normal limits.  No pneumoperitoneum. Non obstructed bowel gas pattern. Increased retained stool in the colon. Stable scoliosis. Stable right upper quadrant surgical clips. No acute osseous abnormality identified.  IMPRESSION: 1.  Non obstructed bowel gas pattern, no free air. 2.  No acute cardiopulmonary abnormality.   Electronically Signed   By: Lars Pinks M.D.   On: 10/21/2013 10:28   Dg Abd Acute W/chest  10/12/2013   CLINICAL DATA:  Abdominal pain and vomiting.  EXAM: ACUTE ABDOMEN SERIES (ABDOMEN 2 VIEW & CHEST 1 VIEW)  COMPARISON:  Chest and abdominal radiographs performed 10/01/2013  FINDINGS: The lungs are well-aerated and clear. There is no evidence of focal opacification, pleural effusion or pneumothorax. The cardiomediastinal silhouette is within normal limits.  The visualized bowel gas pattern is unremarkable. Scattered stool and air are seen within the colon;  there is no evidence of small bowel dilatation to suggest obstruction. No free intra-abdominal air is identified on the provided upright view. Clips are noted within the right upper quadrant, reflecting prior cholecystectomy.  No acute osseous abnormalities are seen; the sacroiliac joints are unremarkable in appearance.  IMPRESSION: 1. Unremarkable bowel gas pattern; no free intra-abdominal air seen. 2. No acute cardiopulmonary process seen.   Electronically Signed   By: Garald Balding M.D.   On: 10/12/2013 22:38   Labs: Basic Metabolic Panel:  Recent Labs Lab 11/02/13 0850 11/02/13 0950 11/03/13 0441  NA  --  140 135*  K  --  4.6 4.9  CL  --  99 100  CO2  --  24 21  GLUCOSE  --  440* 363*  BUN  --  14 8  CREATININE  --  0.54 0.47*  CALCIUM  --  10.1 8.9  MG 2.0  --   --   PHOS 3.9  --   --    Liver Function Tests:  Recent Labs Lab 11/02/13 0950  AST 49*  ALT 45*  ALKPHOS 96  BILITOT 0.3  PROT 7.8  ALBUMIN 4.0    Recent Labs Lab 11/02/13 0950  LIPASE 26   CBC:  Recent Labs Lab 11/02/13 0950 11/03/13 0525  WBC 9.9 12.9*  NEUTROABS 7.8*  --   HGB 12.2 10.6*  HCT 37.7 34.6*  MCV 70.5* 72.4*  PLT 190 177   CBG:  Recent Labs Lab 11/03/13 0805 11/03/13 1154 11/03/13 1625 11/03/13 2140 11/04/13 0747  GLUCAP 373* 386* 284* 72 246*    Signed:  [facility] 11/04/2013, 2:32 PM

## 2013-11-05 ENCOUNTER — Ambulatory Visit: Payer: Self-pay | Admitting: Gastroenterology

## 2013-11-21 ENCOUNTER — Encounter (HOSPITAL_COMMUNITY): Payer: Self-pay | Admitting: Emergency Medicine

## 2013-11-21 ENCOUNTER — Inpatient Hospital Stay (HOSPITAL_COMMUNITY)
Admission: EM | Admit: 2013-11-21 | Discharge: 2013-11-24 | DRG: 638 | Disposition: A | Discharge status: Home / Self Care | Payer: Medicaid Other | Attending: Internal Medicine | Admitting: Internal Medicine

## 2013-11-21 ENCOUNTER — Emergency Department (HOSPITAL_COMMUNITY): Payer: Medicaid Other

## 2013-11-21 DIAGNOSIS — I10 Essential (primary) hypertension: Secondary | ICD-10-CM | POA: Diagnosis present

## 2013-11-21 DIAGNOSIS — R7401 Elevation of levels of liver transaminase levels: Secondary | ICD-10-CM | POA: Diagnosis present

## 2013-11-21 DIAGNOSIS — G40909 Epilepsy, unspecified, not intractable, without status epilepticus: Secondary | ICD-10-CM | POA: Diagnosis present

## 2013-11-21 DIAGNOSIS — F319 Bipolar disorder, unspecified: Secondary | ICD-10-CM | POA: Diagnosis present

## 2013-11-21 DIAGNOSIS — E440 Moderate protein-calorie malnutrition: Secondary | ICD-10-CM | POA: Insufficient documentation

## 2013-11-21 DIAGNOSIS — IMO0002 Reserved for concepts with insufficient information to code with codable children: Secondary | ICD-10-CM | POA: Diagnosis not present

## 2013-11-21 DIAGNOSIS — F1210 Cannabis abuse, uncomplicated: Secondary | ICD-10-CM | POA: Diagnosis present

## 2013-11-21 DIAGNOSIS — R7402 Elevation of levels of lactic acid dehydrogenase (LDH): Secondary | ICD-10-CM | POA: Diagnosis present

## 2013-11-21 DIAGNOSIS — R109 Unspecified abdominal pain: Secondary | ICD-10-CM

## 2013-11-21 DIAGNOSIS — E1010 Type 1 diabetes mellitus with ketoacidosis without coma: Secondary | ICD-10-CM | POA: Diagnosis not present

## 2013-11-21 DIAGNOSIS — E1149 Type 2 diabetes mellitus with other diabetic neurological complication: Secondary | ICD-10-CM | POA: Diagnosis not present

## 2013-11-21 DIAGNOSIS — Z833 Family history of diabetes mellitus: Secondary | ICD-10-CM

## 2013-11-21 DIAGNOSIS — F17200 Nicotine dependence, unspecified, uncomplicated: Secondary | ICD-10-CM | POA: Diagnosis present

## 2013-11-21 DIAGNOSIS — E1110 Type 2 diabetes mellitus with ketoacidosis without coma: Secondary | ICD-10-CM | POA: Diagnosis not present

## 2013-11-21 DIAGNOSIS — E1049 Type 1 diabetes mellitus with other diabetic neurological complication: Secondary | ICD-10-CM | POA: Diagnosis present

## 2013-11-21 DIAGNOSIS — B1920 Unspecified viral hepatitis C without hepatic coma: Secondary | ICD-10-CM | POA: Diagnosis present

## 2013-11-21 DIAGNOSIS — K7460 Unspecified cirrhosis of liver: Secondary | ICD-10-CM | POA: Diagnosis present

## 2013-11-21 DIAGNOSIS — R112 Nausea with vomiting, unspecified: Secondary | ICD-10-CM | POA: Diagnosis not present

## 2013-11-21 DIAGNOSIS — R740 Nonspecific elevation of levels of transaminase and lactic acid dehydrogenase [LDH]: Secondary | ICD-10-CM

## 2013-11-21 DIAGNOSIS — Z87442 Personal history of urinary calculi: Secondary | ICD-10-CM

## 2013-11-21 DIAGNOSIS — E1065 Type 1 diabetes mellitus with hyperglycemia: Secondary | ICD-10-CM | POA: Diagnosis present

## 2013-11-21 DIAGNOSIS — E1143 Type 2 diabetes mellitus with diabetic autonomic (poly)neuropathy: Secondary | ICD-10-CM

## 2013-11-21 DIAGNOSIS — Z7982 Long term (current) use of aspirin: Secondary | ICD-10-CM

## 2013-11-21 DIAGNOSIS — K3184 Gastroparesis: Secondary | ICD-10-CM | POA: Diagnosis not present

## 2013-11-21 DIAGNOSIS — E1142 Type 2 diabetes mellitus with diabetic polyneuropathy: Secondary | ICD-10-CM | POA: Diagnosis present

## 2013-11-21 DIAGNOSIS — F3189 Other bipolar disorder: Secondary | ICD-10-CM

## 2013-11-21 DIAGNOSIS — G8929 Other chronic pain: Secondary | ICD-10-CM

## 2013-11-21 DIAGNOSIS — F3181 Bipolar II disorder: Secondary | ICD-10-CM

## 2013-11-21 DIAGNOSIS — Z794 Long term (current) use of insulin: Secondary | ICD-10-CM

## 2013-11-21 LAB — COMPREHENSIVE METABOLIC PANEL
ALBUMIN: 4.4 g/dL (ref 3.5–5.2)
ALK PHOS: 98 U/L (ref 39–117)
ALT: 43 U/L — AB (ref 0–35)
AST: 56 U/L — AB (ref 0–37)
BUN: 18 mg/dL (ref 6–23)
CALCIUM: 10.4 mg/dL (ref 8.4–10.5)
CO2: 13 mEq/L — ABNORMAL LOW (ref 19–32)
Chloride: 103 mEq/L (ref 96–112)
Creatinine, Ser: 0.5 mg/dL (ref 0.50–1.10)
GFR calc Af Amer: >90 mL/min (ref >90)
GFR calc non Af Amer: >90 mL/min (ref >90)
Glucose, Bld: 419 mg/dL — ABNORMAL HIGH (ref 70–99)
POTASSIUM: 5.2 meq/L (ref 3.7–5.3)
SODIUM: 144 meq/L (ref 137–147)
TOTAL PROTEIN: 8.7 g/dL — AB (ref 6.0–8.3)
Total Bilirubin: 0.3 mg/dL (ref 0.3–1.2)

## 2013-11-21 LAB — CBC WITH DIFFERENTIAL/PLATELET
BASOS ABS: 0 10*3/uL (ref 0.0–0.1)
BASOS PCT: 0 % (ref 0–1)
EOS ABS: 0 10*3/uL (ref 0.0–0.7)
EOS PCT: 0 % (ref 0–5)
HCT: 45 % (ref 36.0–46.0)
Hemoglobin: 14.2 g/dL (ref 12.0–15.0)
Lymphocytes Relative: 18 % (ref 12–46)
Lymphs Abs: 2.1 10*3/uL (ref 0.7–4.0)
MCH: 23.1 pg — AB (ref 26.0–34.0)
MCHC: 31.6 g/dL (ref 30.0–36.0)
MCV: 73.1 fL — AB (ref 78.0–100.0)
Monocytes Absolute: 0.1 10*3/uL (ref 0.1–1.0)
Monocytes Relative: 1 % — ABNORMAL LOW (ref 3–12)
Neutro Abs: 9.1 10*3/uL — ABNORMAL HIGH (ref 1.7–7.7)
Neutrophils Relative %: 80 % — ABNORMAL HIGH (ref 43–77)
PLATELETS: 216 10*3/uL (ref 150–400)
RBC: 6.16 MIL/uL — AB (ref 3.87–5.11)
RDW: 15.5 % (ref 11.5–15.5)
WBC: 11.4 10*3/uL — ABNORMAL HIGH (ref 4.0–10.5)

## 2013-11-21 LAB — GLUCOSE, CAPILLARY
GLUCOSE-CAPILLARY: 281 mg/dL — AB (ref 70–99)
GLUCOSE-CAPILLARY: 157 mg/dL — AB (ref 70–99)
Glucose-Capillary: 303 mg/dL — ABNORMAL HIGH (ref 70–99)
Glucose-Capillary: 285 mg/dL — ABNORMAL HIGH (ref 70–99)
Glucose-Capillary: 230 mg/dL — ABNORMAL HIGH (ref 70–99)
Glucose-Capillary: 155 mg/dL — ABNORMAL HIGH (ref 70–99)
Glucose-Capillary: 138 mg/dL — ABNORMAL HIGH (ref 70–99)

## 2013-11-21 LAB — LIPASE, BLOOD: Lipase: 18 U/L (ref 11–59)

## 2013-11-21 LAB — CBC
HCT: 41.3 % (ref 36.0–46.0)
HEMOGLOBIN: 12.9 g/dL (ref 12.0–15.0)
MCH: 22.8 pg — ABNORMAL LOW (ref 26.0–34.0)
MCHC: 31.2 g/dL (ref 30.0–36.0)
MCV: 73.1 fL — AB (ref 78.0–100.0)
Platelets: 192 10*3/uL (ref 150–400)
RBC: 5.65 MIL/uL — AB (ref 3.87–5.11)
RDW: 15.5 % (ref 11.5–15.5)
WBC: 13.8 10*3/uL — ABNORMAL HIGH (ref 4.0–10.5)

## 2013-11-21 LAB — URINALYSIS, ROUTINE W REFLEX MICROSCOPIC
BILIRUBIN URINE: NEGATIVE
HGB URINE DIPSTICK: NEGATIVE
Ketones, ur: >80 mg/dL — AB
Leukocytes, UA: NEGATIVE
Nitrite: NEGATIVE
PH: 5.5 (ref 5.0–8.0)
Protein, ur: NEGATIVE mg/dL
SPECIFIC GRAVITY, URINE: 1.031 — AB (ref 1.005–1.030)
Urobilinogen, UA: 0.2 mg/dL (ref 0.0–1.0)

## 2013-11-21 LAB — BASIC METABOLIC PANEL
BUN: 16 mg/dL (ref 6–23)
BUN: 17 mg/dL (ref 6–23)
CALCIUM: 9.8 mg/dL (ref 8.4–10.5)
CHLORIDE: 110 meq/L (ref 96–112)
CO2: 11 mEq/L — ABNORMAL LOW (ref 19–32)
CO2: 15 meq/L — AB (ref 19–32)
CREATININE: 0.47 mg/dL — AB (ref 0.50–1.10)
Calcium: 9.5 mg/dL (ref 8.4–10.5)
Chloride: 115 mEq/L — ABNORMAL HIGH (ref 96–112)
Creatinine, Ser: 0.49 mg/dL — ABNORMAL LOW (ref 0.50–1.10)
GFR calc Af Amer: >90 mL/min (ref >90)
GFR calc Af Amer: >90 mL/min (ref >90)
GFR calc non Af Amer: >90 mL/min (ref >90)
GFR calc non Af Amer: >90 mL/min (ref >90)
GLUCOSE: 304 mg/dL — AB (ref 70–99)
Glucose, Bld: 141 mg/dL — ABNORMAL HIGH (ref 70–99)
POTASSIUM: 4.3 meq/L (ref 3.7–5.3)
Potassium: 4.5 mEq/L (ref 3.7–5.3)
SODIUM: 147 meq/L (ref 137–147)
Sodium: 149 mEq/L — ABNORMAL HIGH (ref 137–147)

## 2013-11-21 LAB — MRSA PCR SCREENING: MRSA by PCR: NEGATIVE

## 2013-11-21 LAB — CBG MONITORING, ED: GLUCOSE-CAPILLARY: 375 mg/dL — AB (ref 70–99)

## 2013-11-21 LAB — POC URINE PREG, ED: Preg Test, Ur: NEGATIVE

## 2013-11-21 LAB — KETONES, QUALITATIVE

## 2013-11-21 LAB — URINE MICROSCOPIC-ADD ON

## 2013-11-21 LAB — I-STAT CG4 LACTIC ACID, ED: LACTIC ACID, VENOUS: 1.07 mmol/L (ref 0.5–2.2)

## 2013-11-21 LAB — TROPONIN I

## 2013-11-21 MED ORDER — HYDROMORPHONE HCL PF 1 MG/ML IJ SOLN
0.5000 mg | Freq: Once | INTRAMUSCULAR | Status: AC
  Administered 2013-11-21: 0.5 mg via INTRAVENOUS
  Filled 2013-11-21: qty 1

## 2013-11-21 MED ORDER — MELOXICAM 15 MG PO TABS: 15.0000 mg | ORAL_TABLET | Freq: Every day | ORAL | Status: DC

## 2013-11-21 MED ORDER — METOCLOPRAMIDE HCL 10 MG PO TABS
10.0000 mg | ORAL_TABLET | Freq: Three times a day (TID) | ORAL | Status: DC
  Administered 2013-11-21 – 2013-11-24 (×8): 10 mg via ORAL
  Filled 2013-11-21 (×10): qty 1

## 2013-11-21 MED ORDER — ONDANSETRON HCL 4 MG/2ML IJ SOLN
4.0000 mg | Freq: Four times a day (QID) | INTRAMUSCULAR | Status: DC | PRN
  Administered 2013-11-21 – 2013-11-22 (×2): 4 mg via INTRAVENOUS
  Filled 2013-11-21 (×2): qty 2

## 2013-11-21 MED ORDER — ASPIRIN EC 81 MG PO TBEC
81.0000 mg | DELAYED_RELEASE_TABLET | Freq: Every day | ORAL | Status: DC
  Administered 2013-11-21 – 2013-11-24 (×4): 81 mg via ORAL
  Filled 2013-11-21 (×4): qty 1

## 2013-11-21 MED ORDER — SODIUM CHLORIDE 0.9 % IV BOLUS (SEPSIS)
1000.0000 mL | Freq: Once | INTRAVENOUS | Status: AC
  Administered 2013-11-21: 1000 mL via INTRAVENOUS

## 2013-11-21 MED ORDER — HYOSCYAMINE SULFATE 0.125 MG SL SUBL
0.1250 mg | SUBLINGUAL_TABLET | Freq: Three times a day (TID) | SUBLINGUAL | Status: DC
  Administered 2013-11-21 – 2013-11-24 (×7): 0.125 mg via SUBLINGUAL
  Filled 2013-11-21 (×10): qty 1

## 2013-11-21 MED ORDER — PNEUMOCOCCAL VAC POLYVALENT 25 MCG/0.5ML IJ INJ
0.5000 mL | INJECTION | INTRAMUSCULAR | Status: AC
  Administered 2013-11-24: 0.5 mL via INTRAMUSCULAR
  Filled 2013-11-21 (×3): qty 0.5

## 2013-11-21 MED ORDER — SODIUM CHLORIDE 0.9 % IV SOLN
INTRAVENOUS | Status: DC
  Administered 2013-11-21: 2.4 [IU]/h via INTRAVENOUS
  Filled 2013-11-21: qty 1

## 2013-11-21 MED ORDER — DEXTROSE 50 % IV SOLN: 25.0000 mL | INTRAVENOUS | Status: DC | PRN

## 2013-11-21 MED ORDER — INSULIN ASPART 100 UNIT/ML IV SOLN
5.0000 [IU] | Freq: Once | INTRAVENOUS | Status: DC
  Filled 2013-11-21: qty 0.05

## 2013-11-21 MED ORDER — DIVALPROEX SODIUM 250 MG PO DR TAB
250.0000 mg | DELAYED_RELEASE_TABLET | Freq: Two times a day (BID) | ORAL | Status: DC
  Administered 2013-11-21 – 2013-11-24 (×6): 250 mg via ORAL
  Filled 2013-11-21 (×7): qty 1

## 2013-11-21 MED ORDER — DEXTROSE-NACL 5-0.45 % IV SOLN: INTRAVENOUS | Status: DC

## 2013-11-21 MED ORDER — GABAPENTIN 300 MG PO CAPS
600.0000 mg | ORAL_CAPSULE | Freq: Three times a day (TID) | ORAL | Status: DC
  Administered 2013-11-21 – 2013-11-24 (×7): 600 mg via ORAL
  Filled 2013-11-21 (×10): qty 2

## 2013-11-21 MED ORDER — OXYCODONE-ACETAMINOPHEN 5-325 MG PO TABS: 1.0000 | ORAL_TABLET | Freq: Three times a day (TID) | ORAL | Status: DC | PRN

## 2013-11-21 MED ORDER — ENOXAPARIN SODIUM 40 MG/0.4ML ~~LOC~~ SOLN
40.0000 mg | SUBCUTANEOUS | Status: DC
  Administered 2013-11-21 – 2013-11-23 (×2): 40 mg via SUBCUTANEOUS
  Filled 2013-11-21 (×3): qty 0.4

## 2013-11-21 MED ORDER — PROMETHAZINE HCL 25 MG/ML IJ SOLN: 12.5000 mg | Freq: Four times a day (QID) | INTRAMUSCULAR | Status: DC | PRN

## 2013-11-21 MED ORDER — ONDANSETRON HCL 4 MG/2ML IJ SOLN
4.0000 mg | Freq: Once | INTRAMUSCULAR | Status: AC
  Administered 2013-11-21: 4 mg via INTRAVENOUS
  Filled 2013-11-21: qty 2

## 2013-11-21 MED ORDER — INSULIN REGULAR HUMAN 100 UNIT/ML IJ SOLN: INTRAMUSCULAR | Status: DC

## 2013-11-21 MED ORDER — SODIUM CHLORIDE 0.9 % IV SOLN
INTRAVENOUS | Status: DC
  Administered 2013-11-21 – 2013-11-24 (×2): via INTRAVENOUS

## 2013-11-21 MED ORDER — HYDROMORPHONE HCL PF 1 MG/ML IJ SOLN
1.0000 mg | INTRAMUSCULAR | Status: DC | PRN
  Administered 2013-11-21 – 2013-11-22 (×4): 1 mg via INTRAVENOUS
  Filled 2013-11-21 (×4): qty 1

## 2013-11-21 MED ORDER — DEXTROSE-NACL 5-0.45 % IV SOLN
INTRAVENOUS | Status: DC
  Administered 2013-11-21 – 2013-11-22 (×3): via INTRAVENOUS

## 2013-11-21 MED ORDER — RISPERIDONE 2 MG PO TABS
2.0000 mg | ORAL_TABLET | Freq: Every day | ORAL | Status: DC
  Administered 2013-11-21 – 2013-11-23 (×3): 2 mg via ORAL
  Filled 2013-11-21 (×4): qty 1

## 2013-11-21 NOTE — ED Provider Notes (Signed)
CSN: [id number]     Arrival date & time 11/21/13  1304 History   First MD Initiated Contact with Patient 11/21/13 1313     Chief Complaint  Patient presents with  . Abdominal Pain  . Emesis     (Consider location/radiation/quality/duration/timing/severity/associated sxs/prior Treatment) The history is provided by the patient. No language interpreter was used.  Mary [name] is a 43 y/o F with PMHX of seizures, kidney stones, bipolar disorder, PTSD, heart murmur, DM, hepatitis presenting to the ED with abdominal pain, nausea, and vomiting that has been ongoing for the past day. Reported that the abdominal pain is generalized described as a sharp shooting, stabbing pain that is constant. Stated that she has been unable to keep any food or fluids down. Patient reported that motion makes the pain worse. Stated that she has had countless episodes of vomiting. Stated that the pain at times radiates to her back. Denied fever, chills, chest pain, shortness of breath, difficulty breathing, melena, hematochezia, numbness, tingling, hematuria, urinary issues.  PCP Dr. Fran Lowes   Past Medical History  Diagnosis Date  . Kidney stone   . Seizure disorder     started with pregnancy of first son  . Gastritis   . Bipolar 2 disorder   . Post traumatic stress disorder     "flipping out" after people close to her died  . Heart murmur   . Arthritis     r ankle-S/P surgery (2007)  . Anemia     childhood  . Depression     childhood  . Hepatitis C     biopsy in 2010-no rx-was supposed to see a hepatologist in [place].  With cirrhosis  . Neuropathy     in legs and feet and hands  . Cataracts, bilateral   . Scoliosis   . Diabetes mellitus     diagnosed in 1996-always been on insulin  . DKA (diabetic ketoacidoses)     Recurrent admissions for DKA, medication non-complaince, poor social situation  . Diabetic neuropathy, type I diabetes mellitus     numbness bilaterally feet  . HTN  (hypertension) 10/12/2013   Past Surgical History  Procedure Laterality Date  . Ankle surgery  2007  . Endometrial biopsy  06/22/2012  . Cholecystectomy  04/07/2013  . Cholecystectomy N/A 04/07/2013    Procedure: LAPAROSCOPIC CHOLECYSTECTOMY WITH INTRAOPERATIVE CHOLANGIOGRAM;  Surgeon: Adin Hector, MD;  Location: [facility];  Service: General;  Laterality: N/A;  . Cesarean section      3 times   Family History  Problem Relation Age of Onset  . Diabetes Mother     currently 45  . Fibromyalgia Mother   . Cirrhosis Father     died in 62  . Diabetes Maternal Grandmother   . Diabetes Maternal Aunt    History  Substance Use Topics  . Smoking status: Current Every Day Smoker -- 0.10 packs/day for 25 years    Types: Cigarettes  . Smokeless tobacco: Never Used  . Alcohol Use: No     Comment: Endorses hasn't been drinking   OB History   Grav Para Term Preterm Abortions TAB SAB Ect Mult Living   4 3 3  0 1 0 1 0 1 2     Review of Systems  Constitutional: Negative for fever and chills.  Respiratory: Negative for chest tightness and shortness of breath.   Cardiovascular: Negative for chest pain.  Gastrointestinal: Positive for nausea, vomiting and abdominal pain. Negative for diarrhea, constipation, blood in stool  and anal bleeding.  Musculoskeletal: Negative for back pain and neck pain.  Neurological: Negative for weakness.      Allergies  Penicillins  Home Medications   Prior to Admission medications   Medication Sig Start Date End Date Taking? Authorizing Provider  aspirin EC 81 MG tablet Take 81 mg by mouth daily.     Historical Provider, MD  divalproex (DEPAKOTE) 250 MG DR tablet Take 1 tablet (250 mg total) by mouth every 12 (twelve) hours. 10/23/13   Erline Hau, MD  gabapentin (NEURONTIN) 400 MG capsule Take 2 capsules (800 mg total) by mouth 3 (three) times daily. 10/23/13   Erline Hau, MD  hyoscyamine (LEVSIN SL) 0.125 MG SL tablet Place 1  tablet (0.125 mg total) under the tongue 3 (three) times daily. 10/14/13   Theodis Blaze, MD  insulin NPH-regular Human (NOVOLIN 70/30) (70-30) 100 UNIT/ML injection Inject 35 Units into the skin 2 (two) times daily with a meal. 11/04/13   [place], MD  INSULIN SYRINGE .5CC/28G 28G X 1/2" 0.5 ML MISC 1 Syringe by Does not apply route 2 (two) times daily. 10/23/13   Erline Hau, MD  meloxicam (MOBIC) 15 MG tablet Take 15 mg by mouth daily.     Historical Provider, MD  metFORMIN (GLUCOPHAGE) 1000 MG tablet Take 1,000 mg by mouth 2 (two) times daily.    Historical Provider, MD  metoCLOPramide (REGLAN) 10 MG tablet Take 1 tablet (10 mg total) by mouth 4 (four) times daily -  before meals and at bedtime. 11/04/13   Costin Karlyne Greenspan, MD  oxyCODONE-acetaminophen (PERCOCET/ROXICET) 5-325 MG per tablet Take 1-2 tablets by mouth every 8 (eight) hours as needed for severe pain. 11/04/13   Costin Karlyne Greenspan, MD  risperidone (RISPERDAL) 4 MG tablet Take 0.5 tablets (2 mg total) by mouth at bedtime. 10/23/13   Erline Hau, MD   BP 151/89  Pulse 94  Temp(Src) 97.9 F (36.6 C) (Oral)  Resp 18  SpO2 100% Physical Exam  Nursing note and vitals reviewed. Constitutional: She is oriented to person, place, and time. She appears well-developed and well-nourished. No distress.  Patient tearful, appears uncomfortable.  HENT:  Head: Normocephalic and atraumatic.  Mouth/Throat: No oropharyngeal exudate.  Dry mucus membranes  Eyes: Conjunctivae and EOM are normal. Pupils are equal, round, and reactive to light. Right eye exhibits no discharge. Left eye exhibits no discharge.  Neck: Normal range of motion. Neck supple. No tracheal deviation present.  Cardiovascular: Normal rate, regular rhythm and normal heart sounds.  Exam reveals no friction rub.   No murmur heard. Pulses:      Radial pulses are 2+ on the right side, and 2+ on the left side.       Dorsalis pedis pulses are 2+ on the  right side, and 2+ on the left side.  Pulmonary/Chest: Effort normal and breath sounds normal. No respiratory distress. She has no wheezes. She has no rales.  Abdominal: Soft. Bowel sounds are normal. She exhibits no distension. There is tenderness. There is no rebound and no guarding.  Negative abdominal distension noted BS normoactive in all 4 quadrants Abdomen soft upon palpation  Discomfort upon palpation diffusely  Negative peritoneal signs   Musculoskeletal: Normal range of motion.  Lymphadenopathy:    She has no cervical adenopathy.  Neurological: She is alert and oriented to person, place, and time. No cranial nerve deficit. She exhibits normal muscle tone. Coordination normal.  Skin:  Skin is warm and dry. She is not diaphoretic.  Psychiatric: She has a normal mood and affect. Her behavior is normal. Thought content normal.    ED Course  Procedures (including critical care time)  3:53 PM This provider spoke with Dr. Olen Pel - discussed case, history, labs in great detail. Patient to be admitted to step down for DKA.   Results for orders placed during the hospital encounter of 11/21/13  CBC WITH DIFFERENTIAL      Result Value Ref Range   WBC 11.4 (*) 4.0 - 10.5 K/uL   RBC 6.16 (*) 3.87 - 5.11 MIL/uL   Hemoglobin 14.2  12.0 - 15.0 g/dL   HCT 45.0  36.0 - 46.0 %   MCV 73.1 (*) 78.0 - 100.0 fL   MCH 23.1 (*) 26.0 - 34.0 pg   MCHC 31.6  30.0 - 36.0 g/dL   RDW 15.5  11.5 - 15.5 %   Platelets 216  150 - 400 K/uL   Neutrophils Relative % 80 (*) 43 - 77 %   Neutro Abs 9.1 (*) 1.7 - 7.7 K/uL   Lymphocytes Relative 18  12 - 46 %   Lymphs Abs 2.1  0.7 - 4.0 K/uL   Monocytes Relative 1 (*) 3 - 12 %   Monocytes Absolute 0.1  0.1 - 1.0 K/uL   Eosinophils Relative 0  0 - 5 %   Eosinophils Absolute 0.0  0.0 - 0.7 K/uL   Basophils Relative 0  0 - 1 %   Basophils Absolute 0.0  0.0 - 0.1 K/uL  COMPREHENSIVE METABOLIC PANEL      Result Value Ref Range   Sodium 144  137 - 147 mEq/L    Potassium 5.2  3.7 - 5.3 mEq/L   Chloride 103  96 - 112 mEq/L   CO2 13 (*) 19 - 32 mEq/L   Glucose, Bld 419 (*) 70 - 99 mg/dL   BUN 18  6 - 23 mg/dL   Creatinine, Ser 0.50  0.50 - 1.10 mg/dL   Calcium 10.4  8.4 - 10.5 mg/dL   Total Protein 8.7 (*) 6.0 - 8.3 g/dL   Albumin 4.4  3.5 - 5.2 g/dL   AST 56 (*) 0 - 37 U/L   ALT 43 (*) 0 - 35 U/L   Alkaline Phosphatase 98  39 - 117 U/L   Total Bilirubin 0.3  0.3 - 1.2 mg/dL   GFR calc non Af Amer >90  >90 mL/min   GFR calc Af Amer >90  >90 mL/min  LIPASE, BLOOD      Result Value Ref Range   Lipase 18  11 - 59 U/L  URINALYSIS, ROUTINE W REFLEX MICROSCOPIC      Result Value Ref Range   Color, Urine YELLOW  YELLOW   APPearance CLEAR  CLEAR   Specific Gravity, Urine 1.031 (*) 1.005 - 1.030   pH 5.5  5.0 - 8.0   Glucose, UA >1000 (*) NEGATIVE mg/dL   Hgb urine dipstick NEGATIVE  NEGATIVE   Bilirubin Urine NEGATIVE  NEGATIVE   Ketones, ur >80 (*) NEGATIVE mg/dL   Protein, ur NEGATIVE  NEGATIVE mg/dL   Urobilinogen, UA 0.2  0.0 - 1.0 mg/dL   Nitrite NEGATIVE  NEGATIVE   Leukocytes, UA NEGATIVE  NEGATIVE  TROPONIN I      Result Value Ref Range   Troponin I <0.30  <0.30 ng/mL  URINE MICROSCOPIC-ADD ON      Result Value Ref Range   Squamous  Epithelial / LPF RARE  RARE  POC URINE PREG, ED      Result Value Ref Range   Preg Test, Ur NEGATIVE  NEGATIVE  CBG MONITORING, ED      Result Value Ref Range   Glucose-Capillary 375 (*) 70 - 99 mg/dL    Labs Review Labs Reviewed  CBC WITH DIFFERENTIAL - Abnormal; Notable for the following:    WBC 11.4 (*)    RBC 6.16 (*)    MCV 73.1 (*)    MCH 23.1 (*)    Neutrophils Relative % 80 (*)    Neutro Abs 9.1 (*)    Monocytes Relative 1 (*)    All other components within normal limits  COMPREHENSIVE METABOLIC PANEL - Abnormal; Notable for the following:    CO2 13 (*)    Glucose, Bld 419 (*)    Total Protein 8.7 (*)    AST 56 (*)    ALT 43 (*)    All other components within normal limits   URINALYSIS, ROUTINE W REFLEX MICROSCOPIC - Abnormal; Notable for the following:    Specific Gravity, Urine 1.031 (*)    Glucose, UA >1000 (*)    Ketones, ur >80 (*)    All other components within normal limits  CBG MONITORING, ED - Abnormal; Notable for the following:    Glucose-Capillary 375 (*)    All other components within normal limits  LIPASE, BLOOD  TROPONIN I  URINE MICROSCOPIC-ADD ON  KETONES, QUALITATIVE  POC URINE PREG, ED  I-STAT CG4 LACTIC ACID, ED    Imaging Review No results found.   EKG Interpretation None      Date: 11/21/2013  Rate: 76  Rhythm: normal sinus rhythm  QRS Axis: normal  Intervals: PR shortened  ST/T Wave abnormalities: normal  Conduction Disutrbances:none  Narrative Interpretation: LVH  Old EKG Reviewed: unchanged EKG analyzed and reviewed by attending physician and this provider.    MDM   Final diagnoses:  DKA (diabetic ketoacidoses)  Gastroparesis  Chronic abdominal pain    Medications  dextrose 5 %-0.45 % sodium chloride infusion (not administered)  insulin regular (NOVOLIN R,HUMULIN R) 1 Units/mL in sodium chloride 0.9 % 100 mL infusion (not administered)  sodium chloride 0.9 % bolus 1,000 mL (0 mLs Intravenous Stopped 11/21/13 1541)  ondansetron (ZOFRAN) injection 4 mg (4 mg Intravenous Given 11/21/13 1538)  HYDROmorphone (DILAUDID) injection 0.5 mg (0.5 mg Intravenous Given 11/21/13 1538)   Filed Vitals:   11/21/13 1321  BP: 151/89  Pulse: 94  Temp: 97.9 F (36.6 C)  TempSrc: Oral  Resp: 18  SpO2: 100%   This provider reviewed the patient's chart. Patient has been seen and assessed in the ED setting numerous times regarding abdominal pain - patient has history of chronic abdominal pain. In 2014, patient had 5 CT scans of her abdomen with negative acute abnormalities noted.  EKG noted normal sinus rhythm with a heart rate of 76 bpm. Troponin negative elevation. CBC noted elevated WBC of 11.4 with elevated neutrophils of 9.1.  CMP noted to have mildly elevated AST and ALT - ALT 43, AST 56 which appears to be a trend when compared to previous labs. Glucose 419 with an anion gap of 28 mEq/L. Bicard low at 13. Lipase negative elevation. Lactic acid negative elevation. Small amount of ketones noted. UA noted elevated specific gravity with ketones and glucose in urine. Urine pregnancy negative. Patient started on fluids and pain medications. Discussed case with attending physician, Dr. Cassandria Anger who  recommended patient to be started on glucostabilizer. Patient to be admitted to the hospital for DKA and gastroparesis. Discussed case with Dr. Doyle Askew, [facility] - patient to be admitted to [facility] as inpatient. Patient agreed to plan of care. Patient stable for transfer.   Jamse Mead, PA-C 11/22/13 1142

## 2013-11-21 NOTE — ED Notes (Signed)
IV team unable to obtain 2nd site at this time.

## 2013-11-21 NOTE — ED Notes (Signed)
Pt comes from home with c/o nausea and abdominal pain.  Bowel sounds are diminished.  Pt states she last ate/drank yesterday.  Pt is lethargic and reticent to answer questions.  Pt denies diarrhea and fever.  Pt has good peripheral pulses in all 4 extremities.

## 2013-11-21 NOTE — ED Notes (Addendum)
Per EMS - pt comes from home with c/o abdominal pain that waxes/wanes.  Pt also c/o N/V, but did not vomit in EMS' presence. Pt's parents report on scene that pt has substance abuse history.  Pt's CBG on scene was 404.  Pt does not appear to be taking her diabetic meds (as evidenced by her full medication bottles/syringes).  Pt slept soundly during transport to ED and upon arrival began to loudly c/o abdominal pain.

## 2013-11-21 NOTE — H&P (Signed)
[facility] History and Physical  Mary [name] PJK:[id number] DOB: May 31, 1971 DOA: 11/21/2013  Referring physician: ED physician PCP: Elbert Ewings, FNP   Chief Complaint: Abdominal pain, N/V  HPI:  43 y/o female with seizures, DM type I, bipolar disorder, heart murmur, presented to [facility] ED with main concern of persistent and generalized abdominal pain, throbbing and constant, 7/10 in severity, non radiating, associated with nausea, non bloody vomiting, poor oral intake. This started several days prior to this admission and pt explains she has had similar events in the past due to high sugar levels. She denies chest pain or shortness of breath, no specific focal neurological symptoms.   In ED, pt noted to be hemodynamically stable, AG elevated at 28 and TRH asked to admit to SDU for DKA management.   Assessment and Plan: Active Problems:   DKA (diabetic ketoacidoses) - admit to SDU - place on glucose stabilizer protocol, insulin drip with IVF - K is 5.2 so no need to supplement at this time - repeat BMP tonight to follow up on AG - keep NPO but allow ice chips  - provide analgesia and antiemetics for abd pain as needed    Abdominal pain with N/V - probably related to DKA - supportive care as noted above   Leukocytosis - secondary to DKA, no clear evidence of an infectious etiology  - repeat CBC in AM   Bipolar disorder with depression - stable    Transaminitis  - pt denies alcohol use  - repeat CMET in AM  Radiological Exams on Admission: Dg Abd Acute W/chest  11/21/2013   No acute finding.  Scoliosis.  Status post cholecystectomy.   Code Status: Full Family Communication: Pt at bedside Disposition Plan: Admit for further evaluation     Review of Systems:  Constitutional: Negative for fever, chills. Negative for diaphoresis.  HENT: Negative for hearing loss, ear pain, nosebleeds, congestion, sore throat, neck pain, tinnitus and ear discharge.   Eyes: Negative for  blurred vision, double vision, photophobia, pain, discharge and redness.  Respiratory: Negative for cough, hemoptysis, sputum production, shortness of breath, wheezing and stridor.   Cardiovascular: Negative for chest pain, palpitations, orthopnea, claudication and leg swelling.  Gastrointestinal: Negative for heartburn, constipation, blood in stool and melena.  Genitourinary: Negative for dysuria, urgency, frequency, hematuria and flank pain.  Musculoskeletal: Negative for myalgias, back pain, joint pain and falls.  Skin: Negative for itching and rash.  Neurological: Negative for dizziness, loss of consciousness and headaches.  Endo/Heme/Allergies: Negative for environmental allergies and polydipsia. Does not bruise/bleed easily.  Psychiatric/Behavioral: Negative for suicidal ideas. The patient is not nervous/anxious.      Past Medical History  Diagnosis Date  . Kidney stone   . Seizure disorder     started with pregnancy of first son  . Gastritis   . Bipolar 2 disorder   . Post traumatic stress disorder     "flipping out" after people close to her died  . Heart murmur   . Arthritis     r ankle-S/P surgery (2007)  . Anemia     childhood  . Depression     childhood  . Hepatitis C     biopsy in 2010-no rx-was supposed to see a hepatologist in [place].  With cirrhosis  . Neuropathy     in legs and feet and hands  . Cataracts, bilateral   . Scoliosis   . Diabetes mellitus     diagnosed in 1996-always been on insulin  . DKA (  diabetic ketoacidoses)     Recurrent admissions for DKA, medication non-complaince, poor social situation  . Diabetic neuropathy, type I diabetes mellitus     numbness bilaterally feet  . HTN (hypertension) 10/12/2013    Past Surgical History  Procedure Laterality Date  . Ankle surgery  2007  . Endometrial biopsy  06/22/2012  . Cholecystectomy  04/07/2013  . Cholecystectomy N/A 04/07/2013    Procedure: LAPAROSCOPIC CHOLECYSTECTOMY WITH  INTRAOPERATIVE CHOLANGIOGRAM;  Surgeon: Adin Hector, MD;  Location: [facility]S;  Service: General;  Laterality: N/A;  . Cesarean section      3 times    Social History:  reports that she has been smoking Cigarettes.  She has a 2.5 pack-year smoking history. She has never used smokeless tobacco. She reports that she uses illicit drugs (Marijuana) about once per week. She reports that she does not drink alcohol.  Allergies  Allergen Reactions  . Penicillins Rash    Family History  Problem Relation Age of Onset  . Diabetes Mother     currently 71  . Fibromyalgia Mother   . Cirrhosis Father     died in 41  . Diabetes Maternal Grandmother   . Diabetes Maternal Aunt     Prior to Admission medications   Medication Sig Start Date End Date Taking? Authorizing Provider  aspirin EC 81 MG tablet Take 81 mg by mouth daily.     Historical Provider, MD  divalproex (DEPAKOTE) 250 MG DR tablet Take 1 tablet (250 mg total) by mouth every 12 (twelve) hours. 10/23/13   Erline Hau, MD  gabapentin (NEURONTIN) 400 MG capsule Take 2 capsules (800 mg total) by mouth 3 (three) times daily. 10/23/13   Erline Hau, MD  hyoscyamine (LEVSIN SL) 0.125 MG SL tablet Place 1 tablet (0.125 mg total) under the tongue 3 (three) times daily. 10/14/13   Theodis Blaze, MD  insulin NPH-regular Human (NOVOLIN 70/30) (70-30) 100 UNIT/ML injection Inject 35 Units into the skin 2 (two) times daily with a meal. 11/04/13   Ewing, MD  INSULIN SYRINGE .5CC/28G 28G X 1/2" 0.5 ML MISC 1 Syringe by Does not apply route 2 (two) times daily. 10/23/13   Erline Hau, MD  meloxicam (MOBIC) 15 MG tablet Take 15 mg by mouth daily.     Historical Provider, MD  metFORMIN (GLUCOPHAGE) 1000 MG tablet Take 1,000 mg by mouth 2 (two) times daily.    Historical Provider, MD  metoCLOPramide (REGLAN) 10 MG tablet Take 1 tablet (10 mg total) by mouth 4 (four) times daily -  before meals and at bedtime.  11/04/13   Costin Karlyne Greenspan, MD  oxyCODONE-acetaminophen (PERCOCET/ROXICET) 5-325 MG per tablet Take 1-2 tablets by mouth every 8 (eight) hours as needed for severe pain. 11/04/13   Costin Karlyne Greenspan, MD  risperidone (RISPERDAL) 4 MG tablet Take 0.5 tablets (2 mg total) by mouth at bedtime. 10/23/13   Erline Hau, MD    Physical Exam: Filed Vitals:   11/21/13 1321 11/21/13 1620  BP: 151/89 138/80  Pulse: 94 87  Temp: 97.9 F (36.6 C)   TempSrc: Oral   Resp: 18 14  SpO2: 100% 100%    Physical Exam  Constitutional: Appears malnutritioned, frail and in mild distress due to pain  HENT: Normocephalic. External right and left ear normal. Dry MM  Eyes: Conjunctivae and EOM are normal. PERRLA, no scleral icterus.  Neck: Normal ROM. Neck supple. No JVD. No tracheal  deviation. No thyromegaly.  CVS: RRR, S1/S2 +, no murmurs, no gallops, no carotid bruit.  Pulmonary: Effort and breath sounds normal, no stridor, rhonchi, wheezes, rales.  Abdominal: Soft. BS +,  no distension, tenderness in epigastric area, no rebound or guarding.  Musculoskeletal: Normal range of motion. No edema and no tenderness.  Lymphadenopathy: No lymphadenopathy noted, cervical, inguinal. Neuro: Alert. Normal reflexes, muscle tone coordination. No cranial nerve deficit. Skin: Skin is warm and dry. No rash noted. Not diaphoretic. No erythema. No pallor.  Psychiatric: Normal mood and affect.   Labs on Admission:  Basic Metabolic Panel:  Recent Labs Lab 11/21/13 1346  NA 144  K 5.2  CL 103  CO2 13*  GLUCOSE 419*  BUN 18  CREATININE 0.50  CALCIUM 10.4   Liver Function Tests:  Recent Labs Lab 11/21/13 1346  AST 56*  ALT 43*  ALKPHOS 98  BILITOT 0.3  PROT 8.7*  ALBUMIN 4.4    Recent Labs Lab 11/21/13 1346  LIPASE 18   CBC:  Recent Labs Lab 11/21/13 1346  WBC 11.4*  NEUTROABS 9.1*  HGB 14.2  HCT 45.0  MCV 73.1*  PLT 216   Cardiac Enzymes:  Recent Labs Lab 11/21/13 1346   TROPONINI <0.30   CBG:  Recent Labs Lab 11/21/13 1325  GLUCAP 375*    EKG: Normal sinus rhythm, no ST/T wave changes  Theodis Blaze, MD  [facility] Pager [phone]  If 7PM-7AM, please contact night-coverage [email or web address] Password [email or web address] 11/21/2013, 4:55 PM

## 2013-11-21 NOTE — ED Notes (Signed)
Pt refused to ambulate to bathroom for urine specimen and requested to be catheterized.  After in and out complete, pt urinated on bed.  Linen changed and perineal cleaning performed.

## 2013-11-21 NOTE — ED Notes (Signed)
Unable to obtain 2nd IV site. IV team notified.

## 2013-11-21 NOTE — ED Notes (Signed)
Pt refuses to cooperate with staff when asked to lay on back for ekg pt refused when asked pt about urine specimen pt stated she would void then when pt told she would have to get up to use restroom pt refused and stated she could no longer void

## 2013-11-21 NOTE — ED Notes (Signed)
Bed: WA02 Expected date:  Expected time:  Means of arrival:  Comments: ems- DKA

## 2013-11-22 DIAGNOSIS — F319 Bipolar disorder, unspecified: Secondary | ICD-10-CM

## 2013-11-22 LAB — BASIC METABOLIC PANEL
BUN: 17 mg/dL (ref 6–23)
BUN: 16 mg/dL (ref 6–23)
BUN: 15 mg/dL (ref 6–23)
CO2: 16 meq/L — AB (ref 19–32)
CO2: 16 mEq/L — ABNORMAL LOW (ref 19–32)
CO2: 19 mEq/L (ref 19–32)
Calcium: 9.7 mg/dL (ref 8.4–10.5)
Calcium: 9.3 mg/dL (ref 8.4–10.5)
Calcium: 9.2 mg/dL (ref 8.4–10.5)
Chloride: 110 mEq/L (ref 96–112)
Chloride: 109 mEq/L (ref 96–112)
Chloride: 103 mEq/L (ref 96–112)
Creatinine, Ser: 0.48 mg/dL — ABNORMAL LOW (ref 0.50–1.10)
Creatinine, Ser: 0.44 mg/dL — ABNORMAL LOW (ref 0.50–1.10)
Creatinine, Ser: 0.53 mg/dL (ref 0.50–1.10)
GFR calc Af Amer: >90 mL/min (ref >90)
GFR calc Af Amer: >90 mL/min (ref >90)
GFR calc non Af Amer: >90 mL/min (ref >90)
GFR calc non Af Amer: >90 mL/min (ref >90)
Glucose, Bld: 203 mg/dL — ABNORMAL HIGH (ref 70–99)
Glucose, Bld: 163 mg/dL — ABNORMAL HIGH (ref 70–99)
Glucose, Bld: 167 mg/dL — ABNORMAL HIGH (ref 70–99)
POTASSIUM: 4.3 meq/L (ref 3.7–5.3)
POTASSIUM: 4.3 meq/L (ref 3.7–5.3)
Potassium: 3.6 mEq/L — ABNORMAL LOW (ref 3.7–5.3)
SODIUM: 143 meq/L (ref 137–147)
Sodium: 140 mEq/L (ref 137–147)
Sodium: 139 mEq/L (ref 137–147)

## 2013-11-22 LAB — CBC
HCT: 40 % (ref 36.0–46.0)
HEMOGLOBIN: 12.6 g/dL (ref 12.0–15.0)
MCH: 22.8 pg — AB (ref 26.0–34.0)
MCHC: 31.5 g/dL (ref 30.0–36.0)
MCV: 72.5 fL — AB (ref 78.0–100.0)
Platelets: 188 10*3/uL (ref 150–400)
RBC: 5.52 MIL/uL — AB (ref 3.87–5.11)
RDW: 15.7 % — ABNORMAL HIGH (ref 11.5–15.5)
WBC: 15.7 10*3/uL — AB (ref 4.0–10.5)

## 2013-11-22 LAB — GLUCOSE, CAPILLARY
GLUCOSE-CAPILLARY: 188 mg/dL — AB (ref 70–99)
GLUCOSE-CAPILLARY: 164 mg/dL — AB (ref 70–99)
GLUCOSE-CAPILLARY: 173 mg/dL — AB (ref 70–99)
GLUCOSE-CAPILLARY: 114 mg/dL — AB (ref 70–99)
GLUCOSE-CAPILLARY: 141 mg/dL — AB (ref 70–99)
GLUCOSE-CAPILLARY: 164 mg/dL — AB (ref 70–99)
GLUCOSE-CAPILLARY: 190 mg/dL — AB (ref 70–99)
GLUCOSE-CAPILLARY: 135 mg/dL — AB (ref 70–99)
GLUCOSE-CAPILLARY: 132 mg/dL — AB (ref 70–99)
Glucose-Capillary: 118 mg/dL — ABNORMAL HIGH (ref 70–99)
Glucose-Capillary: 127 mg/dL — ABNORMAL HIGH (ref 70–99)
Glucose-Capillary: 133 mg/dL — ABNORMAL HIGH (ref 70–99)
Glucose-Capillary: 141 mg/dL — ABNORMAL HIGH (ref 70–99)
Glucose-Capillary: 144 mg/dL — ABNORMAL HIGH (ref 70–99)
Glucose-Capillary: 162 mg/dL — ABNORMAL HIGH (ref 70–99)
Glucose-Capillary: 180 mg/dL — ABNORMAL HIGH (ref 70–99)
Glucose-Capillary: 191 mg/dL — ABNORMAL HIGH (ref 70–99)
Glucose-Capillary: 192 mg/dL — ABNORMAL HIGH (ref 70–99)
Glucose-Capillary: 195 mg/dL — ABNORMAL HIGH (ref 70–99)
Glucose-Capillary: 80 mg/dL (ref 70–99)

## 2013-11-22 LAB — COMPREHENSIVE METABOLIC PANEL
ALK PHOS: 79 U/L (ref 39–117)
ALT: 34 U/L (ref 0–35)
AST: 37 U/L (ref 0–37)
Albumin: 3.7 g/dL (ref 3.5–5.2)
BILIRUBIN TOTAL: 0.3 mg/dL (ref 0.3–1.2)
BUN: 18 mg/dL (ref 6–23)
CHLORIDE: 113 meq/L — AB (ref 96–112)
CO2: 19 mEq/L (ref 19–32)
Calcium: 9.7 mg/dL (ref 8.4–10.5)
Creatinine, Ser: 0.5 mg/dL (ref 0.50–1.10)
GLUCOSE: 159 mg/dL — AB (ref 70–99)
Potassium: 4.2 mEq/L (ref 3.7–5.3)
Sodium: 148 mEq/L — ABNORMAL HIGH (ref 137–147)
TOTAL PROTEIN: 7.6 g/dL (ref 6.0–8.3)

## 2013-11-22 NOTE — Progress Notes (Signed)
CARE MANAGEMENT NOTE 11/22/2013  Patient:  Mary [name], Mary [name]   Account Number:  [id number]  Date Initiated:  11/22/2013  Documentation initiated by:  DAVIS,RHONDA  Subjective/Objective Assessment:   pt with hx of diabetes and diabetic gastroporesis, readmitted due to poor po intake and abd pain over the last several days/glucose and AG elevated.     Action/Plan:   home when stable pt is established with the wellness clinic   Anticipated DC Date:  11/25/2013   Anticipated DC Plan:  HOME/SELF CARE  In-house referral  [facility]  CM consult      [facility] Choice  NA   Choice offered to / List presented to:  NA   DME arranged  NA      DME agency  NA     [place] arranged  NA      [place] agency  NA   Status of service:  In process, will continue to follow Medicare Important Message given?  NA - LOS <3 / Initial given by admissions (If response is "NO", the following Medicare IM given date fields will be blank) Date Medicare IM given:   Date Additional Medicare IM given:    Discharge Disposition:    Per UR Regulation:  Reviewed for med. necessity/level of care/duration of stay  If discussed at [place] of Stay Meetings, dates discussed:    Comments:  [id number]/Rhonda Eldridge Dace, [place], [place] [phone] Chart Reviewed for discharge and hospital needs. Discharge needs at time of review: None present will follow for needs. Review of patient progress due on [id number].

## 2013-11-22 NOTE — Progress Notes (Signed)
[facility] PROGRESS NOTE  Mary [name] TTS:[id number] DOB: 06-Aug-1970 DOA: 11/21/2013 PCP: Elbert Ewings, FNP  Assessment/Plan: DKA -Not ready to transition off insulin drip. -Need GAP to be closed and Co2 to be at least 20. -Follow BMETs. -Continue IV insulin for now.  Bipolar Disorder -Mood stable.  Code Status: Full Code Family Communication: Patient only  Disposition Plan: Home when ready   Consultants:  none   Antibiotics:  none   Subjective: No complaints.  Objective: Filed Vitals:   11/22/13 0600 11/22/13 0800 11/22/13 0844 11/22/13 1200  BP: 103/61 107/70    Pulse: 68 72    Temp:   98.6 F (37 C) 98.3 F (36.8 C)  TempSrc:   Oral Oral  Resp: 11 26    Height:      Weight:      SpO2: 100% 100%      Intake/Output Summary (Last 24 hours) at 11/22/13 1248 Last data filed at 11/22/13 1242  Gross per 24 hour  Intake 1049.68 ml  Output    950 ml  Net  99.68 ml   Filed Weights   11/21/13 1720  Weight: 51 kg (112 lb 7 oz)    Exam:   General:  AA Ox3  Cardiovascular: RRR  Respiratory: CTA B  Abdomen: S/NT/ND/+BS  Extremities: no C/C/E   Neurologic:  Non-focal  Data Reviewed: Basic Metabolic Panel:  Recent Labs Lab 11/21/13 1346 11/21/13 1800 11/21/13 2258 11/22/13 0540 11/22/13 1125  NA 144 147 149* 148* 143  K 5.2 4.5 4.3 4.2 4.3  CL 103 110 115* 113* 110  CO2 13* 11* 15* 19 16*  GLUCOSE 419* 304* 141* 159* 203*  BUN 18 16 17 18 17   CREATININE 0.50 0.49* 0.47* 0.50 0.48*  CALCIUM 10.4 9.8 9.5 9.7 9.7   Liver Function Tests:  Recent Labs Lab 11/21/13 1346 11/22/13 0540  AST 56* 37  ALT 43* 34  ALKPHOS 98 79  BILITOT 0.3 0.3  PROT 8.7* 7.6  ALBUMIN 4.4 3.7    Recent Labs Lab 11/21/13 1346  LIPASE 18   No results found for this basename: AMMONIA,  in the last 168 hours CBC:  Recent Labs Lab 11/21/13 1346 11/21/13 1800 11/22/13 0540  WBC 11.4* 13.8* 15.7*  NEUTROABS 9.1*  --   --   HGB  14.2 12.9 12.6  HCT 45.0 41.3 40.0  MCV 73.1* 73.1* 72.5*  PLT 216 192 188   Cardiac Enzymes:  Recent Labs Lab 11/21/13 1346  TROPONINI <0.30   BNP (last 3 results) No results found for this basename: PROBNP,  in the last 8760 hours CBG:  Recent Labs Lab 11/22/13 0738 11/22/13 0842 11/22/13 0943 11/22/13 1047 11/22/13 1157  GLUCAP 141* 133* 195* 191* 164*    Recent Results (from the past 240 hour(s))  MRSA PCR SCREENING     Status: None   Collection Time    11/21/13  5:09 PM      Result Value Ref Range Status   MRSA by PCR NEGATIVE  NEGATIVE Final   Comment:            The GeneXpert MRSA Assay (FDA     approved for NASAL specimens     only), is one component of a     comprehensive MRSA colonization     surveillance program. It is not     intended to diagnose MRSA     infection nor to guide or     monitor treatment for  MRSA infections.     Studies: Dg Abd Acute W/chest  11/21/2013   CLINICAL DATA:  Abdominal pain beginning today.  EXAM: ACUTE ABDOMEN SERIES (ABDOMEN 2 VIEW & CHEST 1 VIEW)  COMPARISON:  Chest in two views abdomen 10/21/2013.  FINDINGS: Single view of the chest demonstrates clear lungs and normal heart size. No pneumothorax or pleural effusion.  Two views of the abdomen show no free intraperitoneal air. The bowel gas pattern is normal. Cholecystectomy clips and thoracolumbar scoliosis are noted.  IMPRESSION: No acute finding.  Scoliosis.  Status post cholecystectomy.   Electronically Signed   By: Inge Rise M.D.   On: 11/21/2013 16:40    Scheduled Meds: . aspirin EC  81 mg Oral Daily  . divalproex  250 mg Oral Q12H  . enoxaparin (LOVENOX) injection  40 mg Subcutaneous Q24H  . gabapentin  600 mg Oral TID  . hyoscyamine  0.125 mg Sublingual TID  . metoCLOPramide  10 mg Oral TID AC & HS  . pneumococcal 23 valent vaccine  0.5 mL Intramuscular Tomorrow-1000  . risperidone  2 mg Oral QHS   Continuous Infusions: . sodium chloride Stopped  (11/21/13 2010)  . dextrose 5 % and 0.45% NaCl 75 mL/hr at 11/22/13 0742  . insulin (NOVOLIN-R) infusion 3.9 Units/hr (11/22/13 1050)    Principal Problem:   DKA (diabetic ketoacidoses) Active Problems:   Bipolar 1 disorder   DM (diabetes mellitus), type 1, uncontrolled    Time spent: 35 minutes. Greater than 50% of this time was spent in direct contact with the patient coordinating care.    [facility] Pager [phone]  If 7PM-7AM, please contact night-coverage at [email or web address], password [facility] 11/22/2013, 12:48 PM  LOS: 1 day

## 2013-11-22 NOTE — Progress Notes (Signed)
Inpatient Diabetes Program Recommendations  AACE/ADA: New Consensus Statement on Inpatient Glycemic Control (2013)  Target Ranges:  Prepandial:   less than 140 mg/dL      Peak postprandial:   less than 180 mg/dL (1-2 hours)      Critically ill patients:  140 - 180 mg/dL   Reason for Visit: DKA  Very familiar with pt from previous admissions. 43 year old female presents to ED with abdominal pain, nausea and vomiting. Per EMS, does not seem to be taking meds as evidenced by her full medication vials/syringes. Home meds: 70/30 35 units bid and metformin 1000 mg bid. On GlucoStabilizer - not quite ready for transition to SQ insulin.  Results for Mary [name], Mary [name] (MRN [id number]) as of 11/22/2013 10:14  Ref. Range 11/22/2013 05:40  Sodium Latest Range: 137-147 mEq/L 148 (H)  Potassium Latest Range: 3.7-5.3 mEq/L 4.2  Chloride Latest Range: 96-112 mEq/L 113 (H)  CO2 Latest Range: 19-32 mEq/L 19  BUN Latest Range: 6-23 mg/dL 18  Creatinine Latest Range: 0.50-1.10 mg/dL 0.50  Calcium Latest Range: 8.4-10.5 mg/dL 9.7  GFR calc non Af Amer Latest Range: >90 mL/min >90  GFR calc Af Amer Latest Range: >90 mL/min >90  Glucose Latest Range: 70-99 mg/dL 159 (H)    When criteria met to transition off insulin drip, give 70/30 insulin 2 hours prior to discontinuation of drip. Novolog sensitive tidwc and hs.  Will continue to follow while inpatient. Thank you. Lorenda Peck, RD, LDN, CDE Inpatient Diabetes Coordinator [phone]

## 2013-11-22 NOTE — ED Provider Notes (Signed)
Medical screening examination/treatment/procedure(s) were performed by non-physician practitioner and as supervising physician I was immediately available for consultation/collaboration.   EKG Interpretation None        Merryl Hacker, MD 11/22/13 [phone]

## 2013-11-22 NOTE — Progress Notes (Signed)
INITIAL NUTRITION ASSESSMENT  Pt meets criteria for moderate MALNUTRITION in the context of chronic illness as evidenced by 7% weight loss in the past month with mild muscle wasting in upper arms.  DOCUMENTATION CODES Per approved criteria  -Non-severe (moderate) malnutrition in the context of chronic illness   INTERVENTION: - Diet advancement per MD - Recommend Glucerna shakes BID - RD to continue to monitor   NUTRITION DIAGNOSIS: Inadequate oral intake related to inability eat as evidenced by NPO.   Goal: Advance diet as tolerated to diabetic diet  Monitor:  Weights, labs, diet advancement   Reason for Assessment: Malnutrition screening tool   43 y.o. female  Admitting Dx: DKA (diabetic ketoacidoses)  ASSESSMENT: Pt with seizures, DM type I, bipolar disorder, heart murmur, presented to [facility] ED with main concern of persistent and generalized abdominal pain, throbbing and constant, 7/10 in severity, non radiating, associated with nausea, non bloody vomiting, poor oral intake. Pt known to RD from past admissions. Weight was 120 pounds the middle of last month, now 112 pounds.   - Pt reports eating 3 meals/day at home, has been trying to eat more fruits recently and get a more balanced diet - Had a hot dog, hushpuppies, and french fries on Saturday and thinks she ate too much - Drinks water throughout the day at home - Denies any nausea/vomiting today and reports abdominal pain is controlled  Nutrition Focused Physical Exam:  Subcutaneous Fat:  Orbital Region: wnl Upper Arm Region: mild muscle wasting Thoracic and Lumbar Region: n/a  Muscle:  Temple Region: wnl Clavicle Bone Region: wnl Clavicle and Acromion Bone Region: wnl Scapular Bone Region: wnl Dorsal Hand: wnl Patellar Region: wnl Anterior Thigh Region: wnl Posterior Calf Region: wnl  Edema: None noted    Height: Ht Readings from Last 1 Encounters:  11/21/13 5\' 4"  (1.626 m)    Weight: Wt Readings from  Last 1 Encounters:  11/21/13 112 lb 7 oz (51 kg)    Ideal Body Weight: 120 lbs   % Ideal Body Weight: 93%  Wt Readings from Last 10 Encounters:  11/21/13 112 lb 7 oz (51 kg)  11/02/13 120 lb 13 oz (54.8 kg)  10/22/13 123 lb 10.9 oz (56.1 kg)  10/12/13 115 lb (52.164 kg)  04/29/13 130 lb 12.8 oz (59.33 kg)  04/15/13 126 lb (57.153 kg)  04/07/13 125 lb (56.7 kg)  04/07/13 125 lb (56.7 kg)  04/02/13 124 lb 11.2 oz (56.564 kg)  03/13/13 123 lb 4.8 oz (55.929 kg)    Usual Body Weight: 127 lbs   % Usual Body Weight: 88%  BMI:  Body mass index is 19.29 kg/(m^2).  Estimated Nutritional Needs: Kcal: 1300-1500 Protein: 60-80g Fluid: 1.3-1.5L/day   Skin: Intact   Diet Order: NPO  EDUCATION NEEDS: -No education needs identified at this time   Intake/Output Summary (Last 24 hours) at 11/22/13 1627 Last data filed at 11/22/13 1242  Gross per 24 hour  Intake 1049.68 ml  Output    950 ml  Net  99.68 ml    Last BM: 6/7  Labs:   Recent Labs Lab 11/21/13 2258 11/22/13 0540 11/22/13 1125  NA 149* 148* 143  K 4.3 4.2 4.3  CL 115* 113* 110  CO2 15* 19 16*  BUN 17 18 17   CREATININE 0.47* 0.50 0.48*  CALCIUM 9.5 9.7 9.7  GLUCOSE 141* 159* 203*    CBG (last 3)   Recent Labs  11/22/13 1157 11/22/13 1304 11/22/13 1408  GLUCAP 164* 127*  144*    Scheduled Meds: . aspirin EC  81 mg Oral Daily  . divalproex  250 mg Oral Q12H  . enoxaparin (LOVENOX) injection  40 mg Subcutaneous Q24H  . gabapentin  600 mg Oral TID  . hyoscyamine  0.125 mg Sublingual TID  . metoCLOPramide  10 mg Oral TID AC & HS  . pneumococcal 23 valent vaccine  0.5 mL Intramuscular Tomorrow-1000  . risperidone  2 mg Oral QHS    Continuous Infusions: . sodium chloride Stopped (11/21/13 2010)  . dextrose 5 % and 0.45% NaCl 75 mL/hr at 11/22/13 0742  . insulin (NOVOLIN-R) infusion 1.7 Units/hr (11/22/13 1409)    Past Medical History  Diagnosis Date  . Kidney stone   . Seizure disorder      started with pregnancy of first son  . Gastritis   . Bipolar 2 disorder   . Post traumatic stress disorder     "flipping out" after people close to her died  . Heart murmur   . Arthritis     r ankle-S/P surgery (2007)  . Anemia     childhood  . Depression     childhood  . Hepatitis C     biopsy in 2010-no rx-was supposed to see a hepatologist in [place].  With cirrhosis  . Neuropathy     in legs and feet and hands  . Cataracts, bilateral   . Scoliosis   . Diabetes mellitus     diagnosed in 1996-always been on insulin  . DKA (diabetic ketoacidoses)     Recurrent admissions for DKA, medication non-complaince, poor social situation  . Diabetic neuropathy, type I diabetes mellitus     numbness bilaterally feet  . HTN (hypertension) 10/12/2013    Past Surgical History  Procedure Laterality Date  . Ankle surgery  2007  . Endometrial biopsy  06/22/2012  . Cholecystectomy  04/07/2013  . Cholecystectomy N/A 04/07/2013    Procedure: LAPAROSCOPIC CHOLECYSTECTOMY WITH INTRAOPERATIVE CHOLANGIOGRAM;  Surgeon: Adin Hector, MD;  Location: [facility];  Service: General;  Laterality: N/A;  . Cesarean section      3 times    Carlis Stable MS, RD, LDN [phone] Pager [phone] Weekend/After Hours Pager

## 2013-11-23 LAB — BASIC METABOLIC PANEL
BUN: 15 mg/dL (ref 6–23)
BUN: 12 mg/dL (ref 6–23)
BUN: 10 mg/dL (ref 6–23)
CALCIUM: 9 mg/dL (ref 8.4–10.5)
CO2: 18 mEq/L — ABNORMAL LOW (ref 19–32)
CO2: 20 mEq/L (ref 19–32)
CO2: 20 mEq/L (ref 19–32)
Calcium: 8.9 mg/dL (ref 8.4–10.5)
Calcium: 8.9 mg/dL (ref 8.4–10.5)
Chloride: 101 mEq/L (ref 96–112)
Chloride: 106 mEq/L (ref 96–112)
Chloride: 107 mEq/L (ref 96–112)
Creatinine, Ser: 0.52 mg/dL (ref 0.50–1.10)
Creatinine, Ser: 0.55 mg/dL (ref 0.50–1.10)
Creatinine, Ser: 0.5 mg/dL (ref 0.50–1.10)
GFR calc Af Amer: >90 mL/min (ref >90)
GFR calc Af Amer: >90 mL/min (ref >90)
GFR calc non Af Amer: >90 mL/min (ref >90)
GFR calc non Af Amer: >90 mL/min (ref >90)
GLUCOSE: 211 mg/dL — AB (ref 70–99)
Glucose, Bld: 172 mg/dL — ABNORMAL HIGH (ref 70–99)
Glucose, Bld: 221 mg/dL — ABNORMAL HIGH (ref 70–99)
Potassium: 4.3 mEq/L (ref 3.7–5.3)
Potassium: 3.4 mEq/L — ABNORMAL LOW (ref 3.7–5.3)
Potassium: 3.2 mEq/L — ABNORMAL LOW (ref 3.7–5.3)
SODIUM: 141 meq/L (ref 137–147)
Sodium: 134 mEq/L — ABNORMAL LOW (ref 137–147)
Sodium: 137 mEq/L (ref 137–147)

## 2013-11-23 LAB — GLUCOSE, CAPILLARY
GLUCOSE-CAPILLARY: 155 mg/dL — AB (ref 70–99)
GLUCOSE-CAPILLARY: 167 mg/dL — AB (ref 70–99)
GLUCOSE-CAPILLARY: 216 mg/dL — AB (ref 70–99)
GLUCOSE-CAPILLARY: 190 mg/dL — AB (ref 70–99)
GLUCOSE-CAPILLARY: 78 mg/dL (ref 70–99)
Glucose-Capillary: 122 mg/dL — ABNORMAL HIGH (ref 70–99)
Glucose-Capillary: 127 mg/dL — ABNORMAL HIGH (ref 70–99)
Glucose-Capillary: 146 mg/dL — ABNORMAL HIGH (ref 70–99)
Glucose-Capillary: 159 mg/dL — ABNORMAL HIGH (ref 70–99)
Glucose-Capillary: 174 mg/dL — ABNORMAL HIGH (ref 70–99)
Glucose-Capillary: 195 mg/dL — ABNORMAL HIGH (ref 70–99)
Glucose-Capillary: 209 mg/dL — ABNORMAL HIGH (ref 70–99)
Glucose-Capillary: 316 mg/dL — ABNORMAL HIGH (ref 70–99)
Glucose-Capillary: 71 mg/dL (ref 70–99)

## 2013-11-23 MED ORDER — OXYCODONE-ACETAMINOPHEN 5-325 MG PO TABS
1.0000 | ORAL_TABLET | ORAL | Status: DC | PRN
  Administered 2013-11-23 (×2): 1 via ORAL
  Filled 2013-11-23 (×2): qty 1

## 2013-11-23 MED ORDER — INSULIN ASPART PROT & ASPART (70-30 MIX) 100 UNIT/ML ~~LOC~~ SUSP
25.0000 [IU] | Freq: Two times a day (BID) | SUBCUTANEOUS | Status: DC
  Administered 2013-11-23 – 2013-11-24 (×3): 25 [IU] via SUBCUTANEOUS
  Filled 2013-11-23: qty 10

## 2013-11-23 MED ORDER — INSULIN ASPART 100 UNIT/ML ~~LOC~~ SOLN
10.0000 [IU] | Freq: Once | SUBCUTANEOUS | Status: AC
  Administered 2013-11-23: 10 [IU] via SUBCUTANEOUS

## 2013-11-23 NOTE — Progress Notes (Signed)
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  Rpt Bmet x 2 to ensure gap remains closed -patient thinks uncontrolled vomiting from Gastroparesis precipitated her DKA  Uncontrolled diabetes mellitus with polyneuropathy, gastroparesis -Last HbA1c was 13 -Patient noted in the past we homeless but states she is able to get her insulin now and is staying somewhere stable -Patient has access to her meals -her usual 70. Dose is 35 twice a day -She'll be transitioned with 10 units of regular insulin -Restart Reglan 10 mg tid c meals--Need OP GI input  Bipolar Disorder -continue Risperidone 2 mg qhs  Seizure disorder -continue Depakote 250 q 12  Cirrhosis with history hepatitis C -Needs outpatient followup  Code Status: Full Code Family Communication: Patient only  Disposition Plan: Home when ready   Consultants:  none   Antibiotics:  none   Subjective: Feels fair. No nausea no vomiting. Think she is ready to eat. States she can get her medications and has a meter at home No other complaints overnight  Objective: Filed Vitals:   11/23/13 0300 11/23/13 0400 11/23/13 0500 11/23/13 0700  BP:  135/77    Pulse: 64 100 62 65  Temp:  98.5 F (36.9 C)    TempSrc:  Oral    Resp: 10 15 10 10   Height:      Weight:      SpO2: 99% 99% 100% 100%    Intake/Output Summary (Last 24 hours) at 11/23/13 0751 Last data filed at 11/23/13 0655  Gross per 24 hour  Intake 1887.35 ml  Output   1425  ml  Net 462.35 ml   Filed Weights   11/21/13 1720 11/23/13 0100  Weight: 51 kg (112 lb 7 oz) 54.4 kg (119 lb 14.9 oz)    Exam:   General:  AA Ox3  Cardiovascular: RRR  Respiratory: CTA B  Abdomen: S/NT/ND/+BS  Extremities: no C/C/E   Neurologic:  Non-focal  Data Reviewed: Basic Metabolic Panel:  Recent Labs Lab 11/22/13 1125 11/22/13 1718 11/22/13 2246 11/23/13 0203 11/23/13 0647  NA 143 140 139 134* 137  K 4.3 4.3 3.6* 4.3 3.4*  CL 110 109 103 101 106  CO2 16* 16* 19 18* 20  GLUCOSE 203* 163* 167* 172* 221*  BUN 17 16 15 15 12   CREATININE 0.48* 0.44* 0.53 0.52 0.55  CALCIUM 9.7 9.3 9.2 8.9 8.9   Liver Function Tests:  Recent Labs Lab 11/21/13 1346 11/22/13 0540  AST 56* 37  ALT 43* 34  ALKPHOS 98 79  BILITOT 0.3 0.3  PROT 8.7* 7.6  ALBUMIN 4.4 3.7    Recent Labs Lab 11/21/13 1346  LIPASE 18   No results found for this basename: AMMONIA,  in the last 168 hours CBC:  Recent Labs Lab 11/21/13 1346 11/21/13 1800 11/22/13 0540  WBC 11.4* 13.8* 15.7*  NEUTROABS 9.1*  --   --   HGB 14.2 12.9 12.6  HCT 45.0 41.3 40.0  MCV 73.1* 73.1* 72.5*  PLT 216 192 188   Cardiac Enzymes:  Recent Labs Lab 11/21/13 1346  TROPONINI <0.30   BNP (last 3 results) No results found for  this basename: PROBNP,  in the last 8760 hours CBG:  Recent Labs Lab 11/23/13 0212 11/23/13 0316 11/23/13 0420 11/23/13 0527 11/23/13 0705  GLUCAP 146* 122* 167* 159* 216*    Recent Results (from the past 240 hour(s))  MRSA PCR SCREENING     Status: None   Collection Time    11/21/13  5:09 PM      Result Value Ref Range Status   MRSA by PCR NEGATIVE  NEGATIVE Final   Comment:            The GeneXpert MRSA Assay (FDA     approved for NASAL specimens     only), is one component of a     comprehensive MRSA colonization     surveillance program. It is not     intended to diagnose MRSA     infection nor to guide or     monitor treatment for     MRSA  infections.     Studies: Dg Abd Acute W/chest  11/21/2013   CLINICAL DATA:  Abdominal pain beginning today.  EXAM: ACUTE ABDOMEN SERIES (ABDOMEN 2 VIEW & CHEST 1 VIEW)  COMPARISON:  Chest in two views abdomen 10/21/2013.  FINDINGS: Single view of the chest demonstrates clear lungs and normal heart size. No pneumothorax or pleural effusion.  Two views of the abdomen show no free intraperitoneal air. The bowel gas pattern is normal. Cholecystectomy clips and thoracolumbar scoliosis are noted.  IMPRESSION: No acute finding.  Scoliosis.  Status post cholecystectomy.   Electronically Signed   By: Inge Rise M.D.   On: 11/21/2013 16:40    Scheduled Meds: . aspirin EC  81 mg Oral Daily  . divalproex  250 mg Oral Q12H  . enoxaparin (LOVENOX) injection  40 mg Subcutaneous Q24H  . gabapentin  600 mg Oral TID  . hyoscyamine  0.125 mg Sublingual TID  . metoCLOPramide  10 mg Oral TID AC & HS  . pneumococcal 23 valent vaccine  0.5 mL Intramuscular Tomorrow-1000  . risperidone  2 mg Oral QHS   Continuous Infusions: . sodium chloride Stopped (11/21/13 2010)  . dextrose 5 % and 0.45% NaCl 75 mL/hr at 11/22/13 2215  . insulin (NOVOLIN-R) infusion 1.6 Units/hr (11/23/13 0707)    Principal Problem:   DKA (diabetic ketoacidoses) Active Problems:   Bipolar 1 disorder   DM (diabetes mellitus), type 1, uncontrolled    Time spent: 12   Verneita Griffes, MD [facility] [phone]

## 2013-11-23 NOTE — Progress Notes (Signed)
Inpatient Diabetes Program Recommendations  AACE/ADA: New Consensus Statement on Inpatient Glycemic Control (2013)  Target Ranges:  Prepandial:   less than 140 mg/dL      Peak postprandial:   less than 180 mg/dL (1-2 hours)      Critically ill patients:  140 - 180 mg/dL   Reason for Visit: Transitioning to basal/bolus insulin  AG - 11, Bicarb - 20. Transitioning to 70/30 25 units bid. Will need Novolog sensitive correction.  Inpatient Diabetes Program Recommendations Correction (SSI): Add Novolog sensitive tidwc and hs HgbA1C: 13.0% - uncontrolled Diet: To start CHO mod med at lunch  Will continue to follow. Thank you. Lorenda Peck, RD, LDN, CDE Inpatient Diabetes Coordinator [phone]

## 2013-11-24 DIAGNOSIS — E1110 Type 2 diabetes mellitus with ketoacidosis without coma: Secondary | ICD-10-CM

## 2013-11-24 DIAGNOSIS — E1149 Type 2 diabetes mellitus with other diabetic neurological complication: Secondary | ICD-10-CM

## 2013-11-24 DIAGNOSIS — K3184 Gastroparesis: Secondary | ICD-10-CM

## 2013-11-24 DIAGNOSIS — E440 Moderate protein-calorie malnutrition: Secondary | ICD-10-CM | POA: Insufficient documentation

## 2013-11-24 LAB — BASIC METABOLIC PANEL
BUN: 7 mg/dL (ref 6–23)
BUN: 7 mg/dL (ref 6–23)
CALCIUM: 8.9 mg/dL (ref 8.4–10.5)
CALCIUM: 8.5 mg/dL (ref 8.4–10.5)
CHLORIDE: 104 meq/L (ref 96–112)
CO2: 19 mEq/L (ref 19–32)
CO2: 20 meq/L (ref 19–32)
CREATININE: 0.47 mg/dL — AB (ref 0.50–1.10)
CREATININE: 0.53 mg/dL (ref 0.50–1.10)
Chloride: 109 mEq/L (ref 96–112)
GFR calc Af Amer: >90 mL/min (ref >90)
GFR calc Af Amer: >90 mL/min (ref >90)
GFR calc non Af Amer: >90 mL/min (ref >90)
GLUCOSE: 348 mg/dL — AB (ref 70–99)
GLUCOSE: 207 mg/dL — AB (ref 70–99)
Potassium: 3.4 mEq/L — ABNORMAL LOW (ref 3.7–5.3)
Potassium: 3.2 mEq/L — ABNORMAL LOW (ref 3.7–5.3)
SODIUM: 142 meq/L (ref 137–147)
Sodium: 137 mEq/L (ref 137–147)

## 2013-11-24 LAB — GLUCOSE, CAPILLARY
Glucose-Capillary: 278 mg/dL — ABNORMAL HIGH (ref 70–99)
Glucose-Capillary: 190 mg/dL — ABNORMAL HIGH (ref 70–99)

## 2013-11-24 MED ORDER — POTASSIUM CHLORIDE CRYS ER 20 MEQ PO TBCR
40.0000 meq | EXTENDED_RELEASE_TABLET | Freq: Once | ORAL | Status: AC
  Administered 2013-11-24: 40 meq via ORAL
  Filled 2013-11-24: qty 2

## 2013-11-24 MED ORDER — INSULIN NPH ISOPHANE & REGULAR (70-30) 100 UNIT/ML ~~LOC~~ SUSP: 35.0000 [IU] | Freq: Two times a day (BID) | SUBCUTANEOUS | Status: DC

## 2013-11-24 MED ORDER — POTASSIUM CHLORIDE CRYS ER 20 MEQ PO TBCR
40.0000 meq | EXTENDED_RELEASE_TABLET | Freq: Once | ORAL | Status: DC
  Filled 2013-11-24: qty 2

## 2013-11-24 MED ORDER — INSULIN ASPART PROT & ASPART (70-30 MIX) 100 UNIT/ML ~~LOC~~ SUSP: 35.0000 [IU] | Freq: Two times a day (BID) | SUBCUTANEOUS | Status: DC

## 2013-11-24 MED ORDER — LIVING WELL WITH DIABETES BOOK
Freq: Once | Status: DC
  Filled 2013-11-24: qty 1

## 2013-11-24 MED ORDER — POTASSIUM CHLORIDE ER 20 MEQ PO TBCR: 20.0000 meq | EXTENDED_RELEASE_TABLET | Freq: Every day | ORAL | Status: DC

## 2013-11-24 MED ORDER — SODIUM CHLORIDE 0.9 % IV BOLUS (SEPSIS)
500.0000 mL | Freq: Once | INTRAVENOUS | Status: AC
  Administered 2013-11-24: 500 mL via INTRAVENOUS

## 2013-11-24 NOTE — Discharge Summary (Signed)
Physician Discharge Summary  Mary [name]:[id number] DOB: [birth year] DOA: 11/21/2013  PCP: Elbert Ewings, FNP  Admit date: 11/21/2013 Discharge date: 11/24/2013  Time spent: 35 minutes  Recommendations for Outpatient Follow-up:  1. Needs further adjustment of insulin regimen.  2. Need B-met to follow up K level.  3. Needs further evaluation Hepatis C and cirrhosis.   Discharge Diagnoses:    DKA (diabetic ketoacidoses)   Bipolar 1 disorder   DM (diabetes mellitus), type 1, uncontrolled   Malnutrition of moderate degree   Discharge Condition: stable.   Diet recommendation: Carb Modified.   Filed Weights   11/21/13 1720 11/23/13 0100  Weight: 51 kg (112 lb 7 oz) 54.4 kg (119 lb 14.9 oz)    History of present illness:  43 y/o female with seizures, DM type I, bipolar disorder, heart murmur, presented to [facility] ED with main concern of persistent and generalized abdominal pain, throbbing and constant, 7/10 in severity, non radiating, associated with nausea, non bloody vomiting, poor oral intake. This started several days prior to this admission and pt explains she has had similar events in the past due to high sugar levels. She denies chest pain or shortness of breath, no specific focal neurological symptoms.  In ED, pt noted to be hemodynamically stable, AG elevated at 28 and TR[facility] asked to admit to SDU for DKA management.    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 She was treated with IV fluids, insulin Gtt. She was transition to long acting insulin. Gap remain close, bicarb at 20.  -transition orders placed for  insulin. Rpt Bmet x 2 to ensure gap remains closed   Uncontrolled diabetes mellitus with polyneuropathy, gastroparesis  -Last HbA1c was 13  -Patient noted in the past we homeless but states she is able to get her insulin now and is staying somewhere stable  -Patient has access to her meals  -her usual 70. Dose is 35 twice a day  -She'll be transitioned with 10 units of regular insulin  -Restart Reglan 10 mg tid c meals--Need OP GI input   Bipolar Disorder  -continue Risperidone 2 mg qhs   Seizure disorder  -continue Depakote 250 q 12   Cirrhosis with history hepatitis C  -Needs outpatient followup   Procedures:  none  Consultations:  none  Discharge Exam: Filed Vitals:   11/24/13 0604  BP: 129/79  Pulse: 79  Temp: 98.4 F (36.9 C)  Resp: 16    General: No distress.  Cardiovascular: S 1, S 2 RRR Respiratory: CTA  Discharge Instructions You were cared for by a hospitalist during your hospital stay. If you have any questions about your discharge medications or the care you received while you were in the hospital after you are discharged, you can call the unit and asked to speak with the hospitalist on call if the hospitalist that took care of you is not available. Once you are discharged, your primary care physician will handle any further medical issues. Please note that NO REFILLS for any discharge medications will be authorized once you are discharged, as it is imperative that you return to your primary care physician (or establish a relationship with a primary care physician if you do  not have one) for your aftercare needs so that they can reassess your need for medications and monitor your lab values.      Discharge Instructions   Diet Carb Modified    Complete by:  As directed      Increase activity slowly    Complete by:  As directed             Medication List         aspirin EC 81 MG tablet  Take 81 mg by mouth daily.     divalproex 250 MG DR tablet   Commonly known as:  DEPAKOTE  Take 1 tablet (250 mg total) by mouth every 12 (twelve) hours.     gabapentin 400 MG capsule  Commonly known as:  NEURONTIN  Take 2 capsules (800 mg total) by mouth 3 (three) times daily.     hyoscyamine 0.125 MG SL tablet  Commonly known as:  LEVSIN SL  Place 1 tablet (0.125 mg total) under the tongue 3 (three) times daily.     insulin NPH-regular Human (70-30) 100 UNIT/ML injection  Commonly known as:  NOVOLIN 70/30  Inject 35 Units into the skin 2 (two) times daily with a meal.     INSULIN SYRINGE .5CC/28G 28G X 1/2" 0.5 ML Misc  1 Syringe by Does not apply route 2 (two) times daily.     metFORMIN 1000 MG tablet  Commonly known as:  GLUCOPHAGE  Take 1,000 mg by mouth 2 (two) times daily.     metoCLOPramide 10 MG tablet  Commonly known as:  REGLAN  Take 1 tablet (10 mg total) by mouth 4 (four) times daily -  before meals and at bedtime.     Potassium Chloride ER 20 MEQ Tbcr  Take 20 mEq by mouth daily.     risperidone 4 MG tablet  Commonly known as:  RISPERDAL  Take 0.5 tablets (2 mg total) by mouth at bedtime.       Allergies  Allergen Reactions  . Penicillins Rash   Follow-up Information   Follow up with STEELE,ANTHONY, FNP In 1 week.   Specialty:  Nurse Practitioner   Contact information:   [address] [place] [address] [phone]        The results of significant diagnostics from this hospitalization (including imaging, microbiology, ancillary and laboratory) are listed below for reference.    Significant Diagnostic Studies: Dg Abd Acute W/chest  11/21/2013   CLINICAL DATA:  Abdominal pain beginning today.  EXAM: ACUTE ABDOMEN SERIES (ABDOMEN 2 VIEW & CHEST 1 VIEW)  COMPARISON:  Chest in two views abdomen 10/21/2013.  FINDINGS: Single view of the chest demonstrates clear lungs and normal heart size. No pneumothorax or pleural effusion.  Two views of the abdomen show no free intraperitoneal air. The bowel gas  pattern is normal. Cholecystectomy clips and thoracolumbar scoliosis are noted.  IMPRESSION: No acute finding.  Scoliosis.  Status post cholecystectomy.   Electronically Signed   By: Inge Rise M.D.   On: 11/21/2013 16:40    Microbiology: Recent Results (from the past 240 hour(s))  MRSA PCR SCREENING     Status: None   Collection Time    11/21/13  5:09 PM      Result Value Ref Range Status   MRSA by PCR NEGATIVE  NEGATIVE Final   Comment:            The GeneXpert MRSA Assay (FDA     approved for NASAL specimens  only), is one component of a     comprehensive MRSA colonization     surveillance program. It is not     intended to diagnose MRSA     infection nor to guide or     monitor treatment for     MRSA infections.     Labs: Basic Metabolic Panel:  Recent Labs Lab 11/23/13 0203 11/23/13 0647 11/23/13 1305 11/24/13 0856 11/24/13 1141  NA 134* 137 141 137 142  K 4.3 3.4* 3.2* 3.4* 3.2*  CL 101 106 107 104 109  CO2 18* $Remov'20 20 19 20  'WByOYf$ GLUCOSE 172* 221* 211* 348* 207*  BUN $Re'15 12 10 7 7  'uCD$ CREATININE 0.52 0.55 0.50 0.47* 0.53  CALCIUM 8.9 8.9 9.0 8.9 8.5   Liver Function Tests:  Recent Labs Lab 11/21/13 1346 11/22/13 0540  AST 56* 37  ALT 43* 34  ALKPHOS 98 79  BILITOT 0.3 0.3  PROT 8.7* 7.6  ALBUMIN 4.4 3.7    Recent Labs Lab 11/21/13 1346  LIPASE 18   No results found for this basename: AMMONIA,  in the last 168 hours CBC:  Recent Labs Lab 11/21/13 1346 11/21/13 1800 11/22/13 0540  WBC 11.4* 13.8* 15.7*  NEUTROABS 9.1*  --   --   HGB 14.2 12.9 12.6  HCT 45.0 41.3 40.0  MCV 73.1* 73.1* 72.5*  PLT 216 192 188   Cardiac Enzymes:  Recent Labs Lab 11/21/13 1346  TROPONINI <0.30   BNP: BNP (last 3 results) No results found for this basename: PROBNP,  in the last 8760 hours CBG:  Recent Labs Lab 11/23/13 1140 11/23/13 1546 11/23/13 2106 11/24/13 0721 11/24/13 1155  GLUCAP 78 316* 127* 278* 190*       Signed:  Izick Gasbarro,  Hannahmarie Asberry A  [facility] 11/24/2013, 5:07 PM

## 2013-11-24 NOTE — Progress Notes (Signed)
Inpatient Diabetes Program Recommendations  AACE/ADA: New Consensus Statement on Inpatient Glycemic Control (2013)  Target Ranges:  Prepandial:   less than 140 mg/dL      Peak postprandial:   less than 180 mg/dL (1-2 hours)      Critically ill patients:  140 - 180 mg/dL   Pt getting ready for discharge. States she has been doing better with checking blood sugars and taking her insulin. Stressed that she did not miss an insulin dose prior to coming to ED. Requests another copy of Living Well With Diabetes book and misc DM information. States she has recently moved and lost all of her diabetes materials. Book and materials ordered. No other questions. Anxious to be discharged because pt states she cannot miss another day of work.  Discussed with RN. Latest BMET shows AG - 48.  Thank you. Lorenda Peck, RD, LDN, CDE Inpatient Diabetes Coordinator [phone]

## 2013-12-01 ENCOUNTER — Emergency Department (HOSPITAL_COMMUNITY): Payer: Self-pay

## 2013-12-01 ENCOUNTER — Inpatient Hospital Stay (HOSPITAL_COMMUNITY)
Admission: AD | Admit: 2013-12-01 | Discharge: 2013-12-03 | DRG: 073 | Disposition: A | Discharge status: Home / Self Care | Payer: Medicaid Other | Attending: Internal Medicine | Admitting: Internal Medicine

## 2013-12-01 ENCOUNTER — Encounter (HOSPITAL_COMMUNITY): Payer: Self-pay | Admitting: Emergency Medicine

## 2013-12-01 DIAGNOSIS — E1110 Type 2 diabetes mellitus with ketoacidosis without coma: Secondary | ICD-10-CM

## 2013-12-01 DIAGNOSIS — E1310 Other specified diabetes mellitus with ketoacidosis without coma: Secondary | ICD-10-CM

## 2013-12-01 DIAGNOSIS — I10 Essential (primary) hypertension: Secondary | ICD-10-CM | POA: Diagnosis present

## 2013-12-01 DIAGNOSIS — E43 Unspecified severe protein-calorie malnutrition: Secondary | ICD-10-CM | POA: Insufficient documentation

## 2013-12-01 DIAGNOSIS — F4310 Post-traumatic stress disorder, unspecified: Secondary | ICD-10-CM | POA: Diagnosis present

## 2013-12-01 DIAGNOSIS — E1049 Type 1 diabetes mellitus with other diabetic neurological complication: Principal | ICD-10-CM | POA: Diagnosis present

## 2013-12-01 DIAGNOSIS — B1920 Unspecified viral hepatitis C without hepatic coma: Secondary | ICD-10-CM | POA: Diagnosis present

## 2013-12-01 DIAGNOSIS — K3184 Gastroparesis: Secondary | ICD-10-CM | POA: Diagnosis present

## 2013-12-01 DIAGNOSIS — Z91148 Patient's other noncompliance with medication regimen for other reason: Secondary | ICD-10-CM

## 2013-12-01 DIAGNOSIS — IMO0002 Reserved for concepts with insufficient information to code with codable children: Secondary | ICD-10-CM | POA: Diagnosis present

## 2013-12-01 DIAGNOSIS — Z91199 Patient's noncompliance with other medical treatment and regimen due to unspecified reason: Secondary | ICD-10-CM

## 2013-12-01 DIAGNOSIS — E1143 Type 2 diabetes mellitus with diabetic autonomic (poly)neuropathy: Secondary | ICD-10-CM | POA: Diagnosis present

## 2013-12-01 DIAGNOSIS — G8929 Other chronic pain: Secondary | ICD-10-CM | POA: Diagnosis present

## 2013-12-01 DIAGNOSIS — R109 Unspecified abdominal pain: Secondary | ICD-10-CM

## 2013-12-01 DIAGNOSIS — Z794 Long term (current) use of insulin: Secondary | ICD-10-CM

## 2013-12-01 DIAGNOSIS — R1110 Vomiting, unspecified: Secondary | ICD-10-CM

## 2013-12-01 DIAGNOSIS — Z7982 Long term (current) use of aspirin: Secondary | ICD-10-CM

## 2013-12-01 DIAGNOSIS — Z9119 Patient's noncompliance with other medical treatment and regimen: Secondary | ICD-10-CM

## 2013-12-01 DIAGNOSIS — E1010 Type 1 diabetes mellitus with ketoacidosis without coma: Secondary | ICD-10-CM

## 2013-12-01 DIAGNOSIS — Z87442 Personal history of urinary calculi: Secondary | ICD-10-CM

## 2013-12-01 DIAGNOSIS — E1069 Type 1 diabetes mellitus with other specified complication: Secondary | ICD-10-CM

## 2013-12-01 DIAGNOSIS — Z880 Allergy status to penicillin: Secondary | ICD-10-CM

## 2013-12-01 DIAGNOSIS — F17200 Nicotine dependence, unspecified, uncomplicated: Secondary | ICD-10-CM | POA: Diagnosis present

## 2013-12-01 DIAGNOSIS — Z9089 Acquired absence of other organs: Secondary | ICD-10-CM

## 2013-12-01 DIAGNOSIS — Z79899 Other long term (current) drug therapy: Secondary | ICD-10-CM

## 2013-12-01 DIAGNOSIS — F319 Bipolar disorder, unspecified: Secondary | ICD-10-CM | POA: Diagnosis present

## 2013-12-01 DIAGNOSIS — G40909 Epilepsy, unspecified, not intractable, without status epilepticus: Secondary | ICD-10-CM | POA: Diagnosis present

## 2013-12-01 DIAGNOSIS — E1065 Type 1 diabetes mellitus with hyperglycemia: Principal | ICD-10-CM | POA: Diagnosis present

## 2013-12-01 DIAGNOSIS — F1210 Cannabis abuse, uncomplicated: Secondary | ICD-10-CM | POA: Diagnosis present

## 2013-12-01 DIAGNOSIS — Z833 Family history of diabetes mellitus: Secondary | ICD-10-CM

## 2013-12-01 DIAGNOSIS — Z9114 Patient's other noncompliance with medication regimen: Secondary | ICD-10-CM

## 2013-12-01 DIAGNOSIS — E1142 Type 2 diabetes mellitus with diabetic polyneuropathy: Secondary | ICD-10-CM | POA: Diagnosis present

## 2013-12-01 LAB — POC URINE PREG, ED: Preg Test, Ur: NEGATIVE

## 2013-12-01 LAB — I-STAT TROPONIN, ED: TROPONIN I, POC: 0 ng/mL (ref 0.00–0.08)

## 2013-12-01 LAB — URINALYSIS, ROUTINE W REFLEX MICROSCOPIC
Bilirubin Urine: NEGATIVE
Glucose, UA: >1000 mg/dL — AB
Hgb urine dipstick: NEGATIVE
Ketones, ur: >80 mg/dL — AB
LEUKOCYTES UA: NEGATIVE
NITRITE: NEGATIVE
PROTEIN: NEGATIVE mg/dL
Specific Gravity, Urine: 1.03 (ref 1.005–1.030)
UROBILINOGEN UA: 0.2 mg/dL (ref 0.0–1.0)
pH: 5.5 (ref 5.0–8.0)

## 2013-12-01 LAB — BASIC METABOLIC PANEL
BUN: 15 mg/dL (ref 6–23)
BUN: 14 mg/dL (ref 6–23)
BUN: 14 mg/dL (ref 6–23)
BUN: 14 mg/dL (ref 6–23)
CALCIUM: 8.9 mg/dL (ref 8.4–10.5)
CALCIUM: 8.9 mg/dL (ref 8.4–10.5)
CHLORIDE: 105 meq/L (ref 96–112)
CO2: 14 meq/L — AB (ref 19–32)
CO2: 16 mEq/L — ABNORMAL LOW (ref 19–32)
CO2: 15 meq/L — AB (ref 19–32)
CO2: 13 mEq/L — ABNORMAL LOW (ref 19–32)
CREATININE: 0.49 mg/dL — AB (ref 0.50–1.10)
CREATININE: 0.43 mg/dL — AB (ref 0.50–1.10)
Calcium: 9.4 mg/dL (ref 8.4–10.5)
Calcium: 9.6 mg/dL (ref 8.4–10.5)
Chloride: 103 mEq/L (ref 96–112)
Chloride: 104 mEq/L (ref 96–112)
Chloride: 106 mEq/L (ref 96–112)
Creatinine, Ser: 0.47 mg/dL — ABNORMAL LOW (ref 0.50–1.10)
Creatinine, Ser: 0.41 mg/dL — ABNORMAL LOW (ref 0.50–1.10)
GFR calc Af Amer: >90 mL/min (ref >90)
GFR calc Af Amer: >90 mL/min (ref >90)
GFR calc Af Amer: >90 mL/min (ref >90)
GFR calc Af Amer: >90 mL/min (ref >90)
GFR calc non Af Amer: >90 mL/min (ref >90)
GFR calc non Af Amer: >90 mL/min (ref >90)
GFR calc non Af Amer: >90 mL/min (ref >90)
GLUCOSE: 300 mg/dL — AB (ref 70–99)
Glucose, Bld: 211 mg/dL — ABNORMAL HIGH (ref 70–99)
Glucose, Bld: 127 mg/dL — ABNORMAL HIGH (ref 70–99)
Glucose, Bld: 155 mg/dL — ABNORMAL HIGH (ref 70–99)
Potassium: 4.9 mEq/L (ref 3.7–5.3)
Potassium: 4.5 mEq/L (ref 3.7–5.3)
Potassium: 5.3 mEq/L (ref 3.7–5.3)
Potassium: 4.9 mEq/L (ref 3.7–5.3)
Sodium: 142 mEq/L (ref 137–147)
Sodium: 141 mEq/L (ref 137–147)
Sodium: 140 mEq/L (ref 137–147)
Sodium: 139 mEq/L (ref 137–147)

## 2013-12-01 LAB — CBG MONITORING, ED
GLUCOSE-CAPILLARY: 413 mg/dL — AB (ref 70–99)
GLUCOSE-CAPILLARY: 329 mg/dL — AB (ref 70–99)
Glucose-Capillary: 343 mg/dL — ABNORMAL HIGH (ref 70–99)

## 2013-12-01 LAB — GLUCOSE, CAPILLARY
GLUCOSE-CAPILLARY: 299 mg/dL — AB (ref 70–99)
GLUCOSE-CAPILLARY: 189 mg/dL — AB (ref 70–99)
GLUCOSE-CAPILLARY: 240 mg/dL — AB (ref 70–99)
Glucose-Capillary: 137 mg/dL — ABNORMAL HIGH (ref 70–99)
Glucose-Capillary: 114 mg/dL — ABNORMAL HIGH (ref 70–99)
Glucose-Capillary: 141 mg/dL — ABNORMAL HIGH (ref 70–99)

## 2013-12-01 LAB — CBC WITH DIFFERENTIAL/PLATELET
BASOS ABS: 0 10*3/uL (ref 0.0–0.1)
BASOS PCT: 0 % (ref 0–1)
EOS ABS: 0 10*3/uL (ref 0.0–0.7)
Eosinophils Relative: 0 % (ref 0–5)
HEMATOCRIT: 40.4 % (ref 36.0–46.0)
HEMOGLOBIN: 12.5 g/dL (ref 12.0–15.0)
LYMPHS ABS: 2.6 10*3/uL (ref 0.7–4.0)
Lymphocytes Relative: 22 % (ref 12–46)
MCH: 22.6 pg — ABNORMAL LOW (ref 26.0–34.0)
MCHC: 30.9 g/dL (ref 30.0–36.0)
MCV: 72.9 fL — AB (ref 78.0–100.0)
Monocytes Absolute: 0.2 10*3/uL (ref 0.1–1.0)
Monocytes Relative: 2 % — ABNORMAL LOW (ref 3–12)
NEUTROS ABS: 9.1 10*3/uL — AB (ref 1.7–7.7)
Neutrophils Relative %: 76 % (ref 43–77)
Platelets: 172 10*3/uL (ref 150–400)
RBC: 5.54 MIL/uL — ABNORMAL HIGH (ref 3.87–5.11)
RDW: 15.1 % (ref 11.5–15.5)
WBC: 11.9 10*3/uL — ABNORMAL HIGH (ref 4.0–10.5)

## 2013-12-01 LAB — COMPREHENSIVE METABOLIC PANEL
ALBUMIN: 4.4 g/dL (ref 3.5–5.2)
ALK PHOS: 101 U/L (ref 39–117)
ALT: 46 U/L — ABNORMAL HIGH (ref 0–35)
AST: 49 U/L — ABNORMAL HIGH (ref 0–37)
BUN: 17 mg/dL (ref 6–23)
CO2: 17 mEq/L — ABNORMAL LOW (ref 19–32)
CREATININE: 0.59 mg/dL (ref 0.50–1.10)
Calcium: 10.3 mg/dL (ref 8.4–10.5)
Chloride: 91 mEq/L — ABNORMAL LOW (ref 96–112)
GFR calc Af Amer: >90 mL/min (ref >90)
GFR calc non Af Amer: >90 mL/min (ref >90)
Glucose, Bld: 441 mg/dL — ABNORMAL HIGH (ref 70–99)
POTASSIUM: 4.9 meq/L (ref 3.7–5.3)
Sodium: 137 mEq/L (ref 137–147)
Total Bilirubin: 0.3 mg/dL (ref 0.3–1.2)
Total Protein: 8.8 g/dL — ABNORMAL HIGH (ref 6.0–8.3)

## 2013-12-01 LAB — URINE MICROSCOPIC-ADD ON

## 2013-12-01 LAB — LIPASE, BLOOD: LIPASE: 22 U/L (ref 11–59)

## 2013-12-01 MED ORDER — POTASSIUM CHLORIDE 10 MEQ/100ML IV SOLN
10.0000 meq | INTRAVENOUS | Status: AC
  Administered 2013-12-01 (×2): 10 meq via INTRAVENOUS
  Filled 2013-12-01 (×2): qty 100

## 2013-12-01 MED ORDER — ENOXAPARIN SODIUM 40 MG/0.4ML ~~LOC~~ SOLN
40.0000 mg | SUBCUTANEOUS | Status: DC
  Administered 2013-12-01 – 2013-12-02 (×2): 40 mg via SUBCUTANEOUS
  Filled 2013-12-01 (×3): qty 0.4

## 2013-12-01 MED ORDER — DEXTROSE-NACL 5-0.45 % IV SOLN
INTRAVENOUS | Status: DC
  Administered 2013-12-01: 16:00:00 via INTRAVENOUS

## 2013-12-01 MED ORDER — SODIUM CHLORIDE 0.9 % IV SOLN
INTRAVENOUS | Status: AC
  Administered 2013-12-01: 2.7 [IU]/h via INTRAVENOUS
  Administered 2013-12-02: 5.6 [IU]/h via INTRAVENOUS
  Filled 2013-12-01: qty 1

## 2013-12-01 MED ORDER — SODIUM CHLORIDE 0.9 % IV BOLUS (SEPSIS)
1000.0000 mL | INTRAVENOUS | Status: AC
  Administered 2013-12-01: 1000 mL via INTRAVENOUS

## 2013-12-01 MED ORDER — ONDANSETRON HCL 4 MG/2ML IJ SOLN
4.0000 mg | Freq: Four times a day (QID) | INTRAMUSCULAR | Status: DC | PRN
  Administered 2013-12-01 – 2013-12-03 (×3): 4 mg via INTRAVENOUS
  Filled 2013-12-01 (×3): qty 2

## 2013-12-01 MED ORDER — HYDROMORPHONE HCL PF 1 MG/ML IJ SOLN
1.0000 mg | INTRAMUSCULAR | Status: DC | PRN
  Administered 2013-12-01 – 2013-12-03 (×4): 1 mg via INTRAVENOUS
  Filled 2013-12-01 (×4): qty 1

## 2013-12-01 MED ORDER — SODIUM CHLORIDE 0.9 % IV SOLN
INTRAVENOUS | Status: DC
  Administered 2013-12-01: 14:00:00 via INTRAVENOUS
  Administered 2013-12-02: 1000 mL via INTRAVENOUS

## 2013-12-01 MED ORDER — SODIUM CHLORIDE 0.9 % IV SOLN
INTRAVENOUS | Status: AC
  Administered 2013-12-01: 14:00:00 via INTRAVENOUS

## 2013-12-01 MED ORDER — GABAPENTIN 300 MG PO CAPS
600.0000 mg | ORAL_CAPSULE | Freq: Three times a day (TID) | ORAL | Status: DC
  Administered 2013-12-01 – 2013-12-03 (×6): 600 mg via ORAL
  Filled 2013-12-01 (×8): qty 2

## 2013-12-01 MED ORDER — SODIUM CHLORIDE 0.9 % IV SOLN: INTRAVENOUS | Status: AC

## 2013-12-01 MED ORDER — DEXTROSE-NACL 5-0.45 % IV SOLN: INTRAVENOUS | Status: DC

## 2013-12-01 MED ORDER — ASPIRIN EC 81 MG PO TBEC
81.0000 mg | DELAYED_RELEASE_TABLET | Freq: Every day | ORAL | Status: DC
  Administered 2013-12-01 – 2013-12-03 (×3): 81 mg via ORAL
  Filled 2013-12-01 (×4): qty 1

## 2013-12-01 MED ORDER — ONDANSETRON HCL 4 MG/2ML IJ SOLN
4.0000 mg | Freq: Once | INTRAMUSCULAR | Status: AC
  Administered 2013-12-01: 4 mg via INTRAVENOUS
  Filled 2013-12-01: qty 2

## 2013-12-01 MED ORDER — HYDROMORPHONE HCL PF 1 MG/ML IJ SOLN
1.0000 mg | INTRAMUSCULAR | Status: AC
  Administered 2013-12-01: 1 mg via INTRAVENOUS
  Filled 2013-12-01: qty 1

## 2013-12-01 MED ORDER — METOCLOPRAMIDE HCL 10 MG PO TABS
10.0000 mg | ORAL_TABLET | Freq: Three times a day (TID) | ORAL | Status: DC
  Administered 2013-12-02 – 2013-12-03 (×6): 10 mg via ORAL
  Filled 2013-12-01 (×8): qty 1

## 2013-12-01 MED ORDER — DIVALPROEX SODIUM 250 MG PO DR TAB
250.0000 mg | DELAYED_RELEASE_TABLET | Freq: Two times a day (BID) | ORAL | Status: DC
  Administered 2013-12-01 – 2013-12-03 (×5): 250 mg via ORAL
  Filled 2013-12-01 (×6): qty 1

## 2013-12-01 MED ORDER — DEXTROSE 50 % IV SOLN: 25.0000 mL | INTRAVENOUS | Status: DC | PRN

## 2013-12-01 MED ORDER — RISPERIDONE 2 MG PO TABS
2.0000 mg | ORAL_TABLET | Freq: Every day | ORAL | Status: DC
  Administered 2013-12-01 – 2013-12-02 (×2): 2 mg via ORAL
  Filled 2013-12-01 (×3): qty 1

## 2013-12-01 NOTE — ED Notes (Addendum)
Per EMS pt with Hx of diabetes and gastroparesis, c/o n/v and diffuse sharp constant abdominal pain, pt has not taken scheduled 35 units NPH 70/30 today.

## 2013-12-01 NOTE — ED Notes (Signed)
Patient is unable to urinate at the moment

## 2013-12-01 NOTE — H&P (Signed)
History and Physical   Mary [name] KGY:[id number] DOB: Nov 18, 1970 DOA: 12/01/2013  Referring physician: Dr. Aline Brochure  PCP: Elbert Ewings, [place]  Specialists: none   Chief Complaint: abdominal pain, hyperglycemia  HPI: Mary [name] is a 43 y.o. female has a past medical history significant for poorly controlled DM with multiple hospitalization for DKA, seizure disorder, hypertension, presents to the emergency room with a chief complaint of severe abdominal pain that started last night progressively worse this morning when she decided to come in. She has a history of gastroparesis and has had multiple hospitalizations for this as well. She states her abdominal pain is midepigastric going "all over", similar to her abdominal pain that she experienced in the past. She denies any fever or chills. She denies any burning with urination, denies any skin rashes or skin ulcers. She has no cough or breathing difficulties. She denies any chest pain, she endorses nausea and inability for any by mouth intake for the past day. She denies missing insulin, however has been running high sugars for the past day, yesterday morning she tells me her blood sugar was "about 500". She took 30 units of 70/30 insulin instead of 25. She usually does that when she is hyperglycemic. She is not sure why she became this hyperglycemic as she has not changed her diet and reports compliance with her insulin regimen.   Review of Systems: as per HPI otherwise negative  Past Medical History  Diagnosis Date  . Kidney stone   . Seizure disorder     started with pregnancy of first son  . Gastritis   . Bipolar 2 disorder   . Post traumatic stress disorder     "flipping out" after people close to her died  . Heart murmur   . Arthritis     r ankle-S/P surgery (2007)  . Anemia     childhood  . Depression     childhood  . Hepatitis C     biopsy in 2010-no rx-was supposed to see a hepatologist in [place].  With cirrhosis   . Neuropathy     in legs and feet and hands  . Cataracts, bilateral   . Scoliosis   . Diabetes mellitus     diagnosed in 1996-always been on insulin  . DKA (diabetic ketoacidoses)     Recurrent admissions for DKA, medication non-complaince, poor social situation  . Diabetic neuropathy, type I diabetes mellitus     numbness bilaterally feet  . HTN (hypertension) 10/12/2013   Past Surgical History  Procedure Laterality Date  . Ankle surgery  2007  . Endometrial biopsy  06/22/2012  . Cholecystectomy  04/07/2013  . Cholecystectomy N/A 04/07/2013    Procedure: LAPAROSCOPIC CHOLECYSTECTOMY WITH INTRAOPERATIVE CHOLANGIOGRAM;  Surgeon: Adin Hector, MD;  Location: [facility]S;  Service: General;  Laterality: N/A;  . Cesarean section      3 times   Social History:  reports that she has been smoking Cigarettes.  She has a 2.5 pack-year smoking history. She has never used smokeless tobacco. She reports that she uses illicit drugs (Marijuana) about once per week. She reports that she does not drink alcohol.  Allergies  Allergen Reactions  . Penicillins Rash    Family History  Problem Relation Age of Onset  . Diabetes Mother     currently 88  . Fibromyalgia Mother   . Cirrhosis Father     died in 57  . Diabetes Maternal Grandmother   . Diabetes Maternal Aunt  Prior to Admission medications   Medication Sig Start Date End Date Taking? Authorizing Mary [name]  aspirin EC 81 MG tablet Take 81 mg by mouth daily.    Yes Historical Mary Staiger, MD  divalproex (DEPAKOTE) 250 MG DR tablet Take 1 tablet (250 mg total) by mouth every 12 (twelve) hours. 10/23/13  Yes Mary Leonie Green, MD  gabapentin (NEURONTIN) 400 MG capsule Take 2 capsules (800 mg total) by mouth 3 (three) times daily. 10/23/13  Yes Mary Leonie Green, MD  hyoscyamine (LEVSIN SL) 0.125 MG SL tablet Place 1 tablet (0.125 mg total) under the tongue 3 (three) times daily. 10/14/13  Yes Theodis Blaze, MD  insulin  NPH-regular Human (NOVOLIN 70/30) (70-30) 100 UNIT/ML injection Inject 35 Units into the skin 2 (two) times daily with a meal. 11/24/13  Yes Mary A Regalado, MD  INSULIN SYRINGE .5CC/28G 28G X 1/2" 0.5 ML MISC 1 Syringe by Does not apply route 2 (two) times daily. 10/23/13  Yes Mary Hau, MD  metFORMIN (GLUCOPHAGE) 1000 MG tablet Take 1,000 mg by mouth 2 (two) times daily.   Yes Historical Mary Schrom, MD  metoCLOPramide (REGLAN) 10 MG tablet Take 1 tablet (10 mg total) by mouth 4 (four) times daily -  before meals and at bedtime. 11/04/13  Yes Mary Karlyne Greenspan, MD  potassium chloride 20 MEQ TBCR Take 20 mEq by mouth daily. 11/24/13  Yes Mary A Regalado, MD  risperidone (RISPERDAL) 4 MG tablet Take 0.5 tablets (2 mg total) by mouth at bedtime. 10/23/13  Yes Mary Hau, MD   Physical Exam: Filed Vitals:   12/01/13 1020 12/01/13 1024 12/01/13 1030 12/01/13 1130  BP:  134/74 123/69   Pulse: 101  101   Temp:      TempSrc:      Resp: 13 16 14 13   SpO2: 98%  97%     General:  No apparent distress  Eyes: no scleral icterus  ENT: moist oropharynx  Neck: supple, no JVD  Cardiovascular: regular rate and rhythm  Respiratory: CTA biL, good air movement without wheezing, rhonchi or crackled  Abdomen: soft, diffuse tenderness throughout   Skin: no rashes  Musculoskeletal: no peripheral edema  Psychiatric: normal mood and affect  Neurologic: non focal  Labs on Admission:  Basic Metabolic Panel:  Recent Labs Lab 12/01/13 0954  NA 137  K 4.9  CL 91*  CO2 17*  GLUCOSE 441*  BUN 17  CREATININE 0.59  CALCIUM 10.3   Liver Function Tests:  Recent Labs Lab 12/01/13 0954  AST 49*  ALT 46*  ALKPHOS 101  BILITOT 0.3  PROT 8.8*  ALBUMIN 4.4    Recent Labs Lab 12/01/13 0954  LIPASE 22   CBC:  Recent Labs Lab 12/01/13 1149  WBC 11.9*  NEUTROABS 9.1*  HGB 12.5  HCT 40.4  MCV 72.9*  PLT 172   CBG:  Recent Labs Lab 12/01/13 1003  12/01/13 1148  GLUCAP 413* 343*   Radiological Exams on Admission: Dg Abd 2 Views  12/01/2013   CLINICAL DATA:  Abdominal pain, nausea and vomiting  EXAM: ABDOMEN - 2 VIEW  COMPARISON:  Radiograph 11/21/2013  FINDINGS: Left lateral decubitus tissue demonstrates no intraperitoneal free air. Lungs are clear. Supine exam demonstrates no dilated large or small bowel.  IMPRESSION: No bowel obstruction or intraperitoneal free air.   Electronically Signed   By: Suzy Bouchard M.D.   On: 12/01/2013 11:01   EKG: Independently reviewed.  Assessment/Plan Active Problems:  Seizure disorder   Non compliance w medication regimen   Bipolar 1 disorder   DM (diabetes mellitus), type 1, uncontrolled   DKA (diabetic ketoacidoses)   Gastroparesis due to DM   Chronic abdominal pain   HTN (hypertension)   DKA - patient with acidosis, decreased bicarbonate level and an anion gap. We'll start insulin drip, keep patient n.p.o. except for ice chips and medications, admitted to step down unit. I discussed with the patient and also called her primary care MDs office, unfortunately he is on vacation but I did discuss with his nurse, she has several missed appointments and is somewhat difficult to manage an outpatient.  Gastroparesis - supportive treatment for now, n.p.o., slowly advance diet once anion gap closes as per #1, continue Reglan  Type 1 diabetes - consult diabetes educator. Consult case management to see if we can help her with Lantus/sliding scale rather than her home regimen of 70/30. Again I discussed with the PCPs office and they will be more than happy to see her and pursue financial screening/assistance to have help with her insulin needs, but she does need to followup and not miss appointments  Diet: NPO Fluids: NS DVT Prophylaxis: [place]  Code Status: Full  Family Communication: d/w patient  Disposition Plan: admit to SDU  Time spent: 65  This note has been created with Biomedical engineer. Any transcriptional errors are unintentional.   Mary M. Cruzita Lederer, MD [facility] Pager [phone]  If 7PM-7AM, please contact night-coverage [email or web address] Password [email or web address] 12/01/2013, 1:05 PM

## 2013-12-01 NOTE — ED Notes (Signed)
Bed: WA09 Expected date:  Expected time:  Means of arrival:  Comments: EMS [place] 

## 2013-12-01 NOTE — ED Provider Notes (Signed)
CSN: [id number]     Arrival date & time 12/01/13  0930 History   First MD Initiated Contact with Patient 12/01/13 [phone]     Chief Complaint  Patient presents with  . Abdominal Pain     (Consider location/radiation/quality/duration/timing/severity/associated sxs/prior Treatment) Patient is a 43 y.o. female presenting with abdominal pain. The history is provided by the patient.  Abdominal Pain Pain location:  Generalized Pain quality: cramping   Pain radiates to:  Does not radiate Pain severity:  Moderate Onset quality:  Gradual Duration:  1 day Timing:  Constant Progression:  Worsening Chronicity:  Chronic Context comment:  Hx of gastroparesis Relieved by:  Nothing Worsened by:  Nothing tried Ineffective treatments:  None tried Associated symptoms: nausea and vomiting   Associated symptoms: no chest pain, no cough, no diarrhea, no dysuria, no fatigue, no fever, no hematuria and no shortness of breath     Past Medical History  Diagnosis Date  . Kidney stone   . Seizure disorder     started with pregnancy of first son  . Gastritis   . Bipolar 2 disorder   . Post traumatic stress disorder     "flipping out" after people close to her died  . Heart murmur   . Arthritis     r ankle-S/P surgery (2007)  . Anemia     childhood  . Depression     childhood  . Hepatitis C     biopsy in 2010-no rx-was supposed to see a hepatologist in [place].  With cirrhosis  . Neuropathy     in legs and feet and hands  . Cataracts, bilateral   . Scoliosis   . Diabetes mellitus     diagnosed in 1996-always been on insulin  . DKA (diabetic ketoacidoses)     Recurrent admissions for DKA, medication non-complaince, poor social situation  . Diabetic neuropathy, type I diabetes mellitus     numbness bilaterally feet  . HTN (hypertension) 10/12/2013   Past Surgical History  Procedure Laterality Date  . Ankle surgery  2007  . Endometrial biopsy  06/22/2012  . Cholecystectomy  04/07/2013   . Cholecystectomy N/A 04/07/2013    Procedure: LAPAROSCOPIC CHOLECYSTECTOMY WITH INTRAOPERATIVE CHOLANGIOGRAM;  Surgeon: Adin Hector, MD;  Location: [facility];  Service: General;  Laterality: N/A;  . Cesarean section      3 times   Family History  Problem Relation Age of Onset  . Diabetes Mother     currently 54  . Fibromyalgia Mother   . Cirrhosis Father     died in 52  . Diabetes Maternal Grandmother   . Diabetes Maternal Aunt    History  Substance Use Topics  . Smoking status: Current Every Day Smoker -- 0.10 packs/day for 25 years    Types: Cigarettes  . Smokeless tobacco: Never Used  . Alcohol Use: No     Comment: Endorses hasn't been drinking   OB History   Grav Para Term Preterm Abortions TAB SAB Ect Mult Living   4 3 3  0 1 0 1 0 1 2     Review of Systems  Constitutional: Negative for fever and fatigue.  HENT: Negative for congestion and drooling.   Eyes: Negative for pain.  Respiratory: Negative for cough and shortness of breath.   Cardiovascular: Negative for chest pain.  Gastrointestinal: Positive for nausea, vomiting and abdominal pain. Negative for diarrhea.  Genitourinary: Negative for dysuria and hematuria.  Musculoskeletal: Negative for back pain, gait problem and neck pain.  Skin: Negative for color change.  Neurological: Negative for dizziness and headaches.  Hematological: Negative for adenopathy.  Psychiatric/Behavioral: Negative for behavioral problems.  All other systems reviewed and are negative.     Allergies  Penicillins  Home Medications   Prior to Admission medications   Medication Sig Start Date End Date Taking? Authorizing Read Bonelli  aspirin EC 81 MG tablet Take 81 mg by mouth daily.     Historical San Lohmeyer, MD  divalproex (DEPAKOTE) 250 MG DR tablet Take 1 tablet (250 mg total) by mouth every 12 (twelve) hours. 10/23/13   Erline Hau, MD  gabapentin (NEURONTIN) 400 MG capsule Take 2 capsules (800 mg total) by mouth 3  (three) times daily. 10/23/13   Erline Hau, MD  hyoscyamine (LEVSIN SL) 0.125 MG SL tablet Place 1 tablet (0.125 mg total) under the tongue 3 (three) times daily. 10/14/13   Theodis Blaze, MD  insulin NPH-regular Human (NOVOLIN 70/30) (70-30) 100 UNIT/ML injection Inject 35 Units into the skin 2 (two) times daily with a meal. 11/24/13   Belkys A Regalado, MD  INSULIN SYRINGE .5CC/28G 28G X 1/2" 0.5 ML MISC 1 Syringe by Does not apply route 2 (two) times daily. 10/23/13   Erline Hau, MD  metFORMIN (GLUCOPHAGE) 1000 MG tablet Take 1,000 mg by mouth 2 (two) times daily.    Historical Tya Haughey, MD  metoCLOPramide (REGLAN) 10 MG tablet Take 1 tablet (10 mg total) by mouth 4 (four) times daily -  before meals and at bedtime. 11/04/13   Costin Karlyne Greenspan, MD  potassium chloride 20 MEQ TBCR Take 20 mEq by mouth daily. 11/24/13   Belkys A Regalado, MD  risperidone (RISPERDAL) 4 MG tablet Take 0.5 tablets (2 mg total) by mouth at bedtime. 10/23/13   Erline Hau, MD   Temp(Src) 98.7 F (37.1 C) (Oral) Physical Exam  Nursing note and vitals reviewed. Constitutional: She is oriented to person, place, and time. She appears well-developed and well-nourished.  HENT:  Head: Normocephalic and atraumatic.  Mouth/Throat: Oropharynx is clear and moist. No oropharyngeal exudate.  Eyes: Conjunctivae and EOM are normal. Pupils are equal, round, and reactive to light.  Neck: Normal range of motion. Neck supple.  Cardiovascular: Normal rate, regular rhythm, normal heart sounds and intact distal pulses.  Exam reveals no gallop and no friction rub.   No murmur heard. Pulmonary/Chest: Effort normal and breath sounds normal. No respiratory distress. She has no wheezes.  Abdominal: Soft. Bowel sounds are normal. There is tenderness. There is no rebound and no guarding.  Diffuse mild non-focal ttp.   Musculoskeletal: Normal range of motion. She exhibits no edema and no tenderness.   Neurological: She is alert and oriented to person, place, and time.  Skin: Skin is warm and dry.  Psychiatric: She has a normal mood and affect. Her behavior is normal.    ED Course  Procedures (including critical care time) Labs Review Labs Reviewed  COMPREHENSIVE METABOLIC PANEL - Abnormal; Notable for the following:    Chloride 91 (*)    CO2 17 (*)    Glucose, Bld 441 (*)    Total Protein 8.8 (*)    AST 49 (*)    ALT 46 (*)    All other components within normal limits  URINALYSIS, ROUTINE W REFLEX MICROSCOPIC - Abnormal; Notable for the following:    Glucose, UA >1000 (*)    Ketones, ur >80 (*)    All other components within normal  limits  CBC WITH DIFFERENTIAL - Abnormal; Notable for the following:    WBC 11.9 (*)    RBC 5.54 (*)    MCV 72.9 (*)    MCH 22.6 (*)    Monocytes Relative 2 (*)    Neutro Abs 9.1 (*)    All other components within normal limits  BASIC METABOLIC PANEL - Abnormal; Notable for the following:    CO2 14 (*)    Glucose, Bld 300 (*)    Creatinine, Ser 0.47 (*)    All other components within normal limits  CBG MONITORING, ED - Abnormal; Notable for the following:    Glucose-Capillary 413 (*)    All other components within normal limits  CBG MONITORING, ED - Abnormal; Notable for the following:    Glucose-Capillary 343 (*)    All other components within normal limits  CBG MONITORING, ED - Abnormal; Notable for the following:    Glucose-Capillary 329 (*)    All other components within normal limits  LIPASE, BLOOD  URINE MICROSCOPIC-ADD ON  CBC WITH DIFFERENTIAL  BASIC METABOLIC PANEL  BASIC METABOLIC PANEL  I-STAT TROPOININ, ED  POC URINE PREG, ED    Imaging Review Dg Abd 2 Views  12/01/2013   CLINICAL DATA:  Abdominal pain, nausea and vomiting  EXAM: ABDOMEN - 2 VIEW  COMPARISON:  Radiograph 11/21/2013  FINDINGS: Left lateral decubitus tissue demonstrates no intraperitoneal free air. Lungs are clear. Supine exam demonstrates no dilated  large or small bowel.  IMPRESSION: No bowel obstruction or intraperitoneal free air.   Electronically Signed   By: Suzy Bouchard M.D.   On: 12/01/2013 11:01     EKG Interpretation None      MDM   Final diagnoses:  DKA (diabetic ketoacidoses)  Vomiting    10:16 AM 43 y.o. female w hx of DM, gastroparesis multiple admissions here in the past for gastroparesis or DKA who presents with nausea, vomiting, and diffuse abdominal cramping. She relates her symptoms to be consistent with gastroparesis which she has had many times in the past. She is afebrile and vital signs are unremarkable here. She has diffuse abdominal cramping which does not localize to palpation on my exam. She has had many CT scans here in the past. We'll start with screening labs and plain film imaging. Will get symptom control with pain medicine and nausea medicine.  Will admit for DKA.   Blanchard Kelch, MD 12/01/13 1630

## 2013-12-02 DIAGNOSIS — E1310 Other specified diabetes mellitus with ketoacidosis without coma: Secondary | ICD-10-CM | POA: Diagnosis not present

## 2013-12-02 DIAGNOSIS — E43 Unspecified severe protein-calorie malnutrition: Secondary | ICD-10-CM | POA: Insufficient documentation

## 2013-12-02 LAB — BASIC METABOLIC PANEL
BUN: 12 mg/dL (ref 6–23)
BUN: 12 mg/dL (ref 6–23)
CALCIUM: 8.9 mg/dL (ref 8.4–10.5)
CALCIUM: 8.9 mg/dL (ref 8.4–10.5)
CO2: 20 meq/L (ref 19–32)
CO2: 18 mEq/L — ABNORMAL LOW (ref 19–32)
CREATININE: 0.5 mg/dL (ref 0.50–1.10)
Chloride: 102 mEq/L (ref 96–112)
Chloride: 104 mEq/L (ref 96–112)
Creatinine, Ser: 0.46 mg/dL — ABNORMAL LOW (ref 0.50–1.10)
GFR calc Af Amer: >90 mL/min (ref >90)
GLUCOSE: 178 mg/dL — AB (ref 70–99)
Glucose, Bld: 161 mg/dL — ABNORMAL HIGH (ref 70–99)
Potassium: 3.9 mEq/L (ref 3.7–5.3)
Potassium: 4.2 mEq/L (ref 3.7–5.3)
SODIUM: 137 meq/L (ref 137–147)
SODIUM: 137 meq/L (ref 137–147)

## 2013-12-02 LAB — GLUCOSE, CAPILLARY
GLUCOSE-CAPILLARY: 148 mg/dL — AB (ref 70–99)
GLUCOSE-CAPILLARY: 143 mg/dL — AB (ref 70–99)
GLUCOSE-CAPILLARY: 157 mg/dL — AB (ref 70–99)
GLUCOSE-CAPILLARY: 175 mg/dL — AB (ref 70–99)
GLUCOSE-CAPILLARY: 379 mg/dL — AB (ref 70–99)
GLUCOSE-CAPILLARY: 76 mg/dL (ref 70–99)
GLUCOSE-CAPILLARY: 216 mg/dL — AB (ref 70–99)
Glucose-Capillary: 135 mg/dL — ABNORMAL HIGH (ref 70–99)
Glucose-Capillary: 143 mg/dL — ABNORMAL HIGH (ref 70–99)
Glucose-Capillary: 148 mg/dL — ABNORMAL HIGH (ref 70–99)
Glucose-Capillary: 165 mg/dL — ABNORMAL HIGH (ref 70–99)
Glucose-Capillary: 205 mg/dL — ABNORMAL HIGH (ref 70–99)
Glucose-Capillary: 39 mg/dL — CL (ref 70–99)
Glucose-Capillary: 338 mg/dL — ABNORMAL HIGH (ref 70–99)
Glucose-Capillary: 169 mg/dL — ABNORMAL HIGH (ref 70–99)
Glucose-Capillary: 178 mg/dL — ABNORMAL HIGH (ref 70–99)
Glucose-Capillary: 178 mg/dL — ABNORMAL HIGH (ref 70–99)

## 2013-12-02 MED ORDER — INSULIN NPH (HUMAN) (ISOPHANE) 100 UNIT/ML ~~LOC~~ SUSP
18.0000 [IU] | Freq: Two times a day (BID) | SUBCUTANEOUS | Status: DC
  Administered 2013-12-02: 18 [IU] via SUBCUTANEOUS
  Filled 2013-12-02: qty 10

## 2013-12-02 MED ORDER — INSULIN ASPART PROT & ASPART (70-30 MIX) 100 UNIT/ML ~~LOC~~ SUSP
25.0000 [IU] | Freq: Two times a day (BID) | SUBCUTANEOUS | Status: DC
  Filled 2013-12-02: qty 10

## 2013-12-02 MED ORDER — INSULIN NPH (HUMAN) (ISOPHANE) 100 UNIT/ML ~~LOC~~ SUSP
15.0000 [IU] | Freq: Two times a day (BID) | SUBCUTANEOUS | Status: DC
  Administered 2013-12-02 – 2013-12-03 (×2): 15 [IU] via SUBCUTANEOUS
  Filled 2013-12-02: qty 10

## 2013-12-02 MED ORDER — INSULIN ASPART 100 UNIT/ML ~~LOC~~ SOLN
0.0000 [IU] | Freq: Three times a day (TID) | SUBCUTANEOUS | Status: DC
  Administered 2013-12-02: 7 [IU] via SUBCUTANEOUS
  Administered 2013-12-03 (×2): 3 [IU] via SUBCUTANEOUS

## 2013-12-02 NOTE — Progress Notes (Signed)
PROGRESS NOTE  Mary [name] ZOX:[id number] DOB: [birth year] DOA: 12/01/2013 PCP: Elbert Ewings, FNP  HPI: Mary [name] is a 43 y.o. female has a past medical history significant for poorly controlled DM with multiple hospitalization for DKA, seizure disorder, hypertension, presents to the emergency room with gastroparesis and found to be in mild DKA.  Assessment/Plan: DKA - bicarb normalized on early labs, transition to s.q. Insulin, will use NPH 15 BID (received 18U and was hypoglycemic), plus SSI.  - CM consulted for assistance with her insulin - Diabetes educator consulted.   Gastroparesis - pain improved this morning, allow diet  Diet: carb modified Fluids: NS at 50  DVT Prophylaxis: Lovenox  Code Status: Full Family Communication: d/w patient  Disposition Plan: home when ready   Consultants:  None   Procedures:  None    Antibiotics None   HPI/Subjective: - feeling better this morning   Objective: Filed Vitals:   12/02/13 0400 12/02/13 0404 12/02/13 0600 12/02/13 0645  BP: 82/45 86/45 86/50  125/79  Pulse: 69 69 79 104  Temp: 98.1 F (36.7 C)     TempSrc: Oral     Resp: 10 13 13 26   Height:      Weight: 52.6 kg (115 lb 15.4 oz)     SpO2: 100% 100% 100% 100%    Intake/Output Summary (Last 24 hours) at 12/02/13 0725 Last data filed at 12/02/13 0648  Gross per 24 hour  Intake 837.48 ml  Output    925 ml  Net -87.52 ml   Filed Weights   12/01/13 1435 12/02/13 0400  Weight: 51.4 kg (113 lb 5.1 oz) 52.6 kg (115 lb 15.4 oz)   Exam:  General:  NAD  Cardiovascular: regular rate and rhythm, without MRG  Respiratory: good air movement, clear to auscultation throughout, no wheezing, ronchi or rales  Abdomen: soft, not tender to palpation, positive bowel sounds  MSK: no peripheral edema  Data Reviewed: Basic Metabolic Panel:  Recent Labs Lab 12/01/13 1500 12/01/13 1700 12/01/13 1917 12/01/13 2300 12/02/13 0330  NA 142 141 140 139 137  K  4.9 4.5 5.3 4.9 3.9  CL 103 104 106 105 102  CO2 14* 16* 15* 13* 20  GLUCOSE 300* 211* 127* 155* 161*  BUN 15 14 14 14 12   CREATININE 0.47* 0.49* 0.41* 0.43* 0.50  CALCIUM 9.4 9.6 8.9 8.9 8.9   Liver Function Tests:  Recent Labs Lab 12/01/13 0954  AST 49*  ALT 46*  ALKPHOS 101  BILITOT 0.3  PROT 8.8*  ALBUMIN 4.4    Recent Labs Lab 12/01/13 0954  LIPASE 22   No results found for this basename: AMMONIA,  in the last 168 hours CBC:  Recent Labs Lab 12/01/13 1149  WBC 11.9*  NEUTROABS 9.1*  HGB 12.5  HCT 40.4  MCV 72.9*  PLT 172   Cardiac Enzymes: No results found for this basename: CKTOTAL, CKMB, CKMBINDEX, TROPONINI,  in the last 168 hours BNP (last 3 results) No results found for this basename: PROBNP,  in the last 8760 hours CBG:  Recent Labs Lab 12/01/13 2257 12/02/13 0002 12/02/13 0106 12/02/13 0211 12/02/13 0331  GLUCAP 157* 135* 143* 148* 175*    No results found for this or any previous visit (from the past 240 hour(s)).   Studies: Dg Abd 2 Views  12/01/2013   CLINICAL DATA:  Abdominal pain, nausea and vomiting  EXAM: ABDOMEN - 2 VIEW  COMPARISON:  Radiograph 11/21/2013  FINDINGS: Left lateral decubitus tissue demonstrates  no intraperitoneal free air. Lungs are clear. Supine exam demonstrates no dilated large or small bowel.  IMPRESSION: No bowel obstruction or intraperitoneal free air.   Electronically Signed   By: Suzy Bouchard M.D.   On: 12/01/2013 11:01    Scheduled Meds: . sodium chloride   Intravenous STAT  . aspirin EC  81 mg Oral Daily  . divalproex  250 mg Oral Q12H  . enoxaparin (LOVENOX) injection  40 mg Subcutaneous Q24H  . gabapentin  600 mg Oral TID  . insulin aspart protamine- aspart  25 Units Subcutaneous BID WC  . metoCLOPramide  10 mg Oral TID AC & HS  . risperidone  2 mg Oral QHS   Continuous Infusions: . sodium chloride Stopped (12/01/13 1629)  . dextrose 5 % and 0.45% NaCl 50 mL/hr at 12/01/13 1630  . insulin  (NOVOLIN-R) infusion 1.1 Units/hr (12/02/13 2633)    Active Problems:   Seizure disorder   Non compliance w medication regimen   Bipolar 1 disorder   DM (diabetes mellitus), type 1, uncontrolled   DKA (diabetic ketoacidoses)   Gastroparesis due to DM   Chronic abdominal pain   HTN (hypertension)   Time spent: 25  This note has been created with Surveyor, quantity. Any transcriptional errors are unintentional.   Marzetta Board, MD [facility] Pager [phone]. If 7 PM - 7 AM, please contact night-coverage at [email or web address], password [facility] 12/02/2013, 7:25 AM  LOS: 1 day

## 2013-12-02 NOTE — Progress Notes (Signed)
INITIAL NUTRITION ASSESSMENT  DOCUMENTATION CODES Per approved criteria  -Severe malnutrition in the context of acute illness  Pt meets criteria for severe MALNUTRITION in the context of acute illness as evidenced by reported 10.2% wt loss in one month and reported intake <50% of estimated needs for >5 days.  INTERVENTION: -Once diet advanced, add Glucerna Shake po BID, each supplement provides 220 kcal and 10 grams of protein - RD will continue to monitor for nutrition care plan.  NUTRITION DIAGNOSIS: Inadequate oral intake related to inability to eat as evidenced by NPO followed by clear liquid diet.   Goal: Pt to meet >/= 90% of their estimated nutrition needs   Monitor:  Wt trends, po intake, labs  Reason for Assessment: Mary [name]  43 y.o. female  Admitting Dx: <principal problem not specified>  ASSESSMENT: 43 y.o. female has a past medical history significant for poorly controlled DM with multiple hospitalization for DKA, seizure disorder, hypertension, presents to the emergency room with a chief complaint of severe abdominal pain that started last night progressively worse this morning when she decided to come in. She has a history of gastroparesis and has had multiple hospitalizations for this as well. She states her abdominal pain is midepigastric going "all over", similar to her abdominal pain that she experienced in the past.   - Pt reports that her usual body weight is 127-128 lbs about one month ago, but that she loses weight very fast when she has episodes of nausea and vomiting. - Pt reports that her appetite has improved and that she is ready to have her diet upgraded to full liquids and then regular food.  - Pt reports that she follows a carbohydrate modified diet at home, and that she believes that her blood sugars have been high related to medications. - Pt showed minor signs of fat and muscle wasting of the face, hands, and collarbone  Height: Ht Readings from Last 1  Encounters:  12/01/13 5\' 4"  (1.626 m)    Weight: Wt Readings from Last 1 Encounters:  12/02/13 115 lb 15.4 oz (52.6 kg)    Ideal Body Weight: 54.7 kg  % Ideal Body Weight: 96.1%  Wt Readings from Last 10 Encounters:  12/02/13 115 lb 15.4 oz (52.6 kg)  11/23/13 119 lb 14.9 oz (54.4 kg)  11/02/13 120 lb 13 oz (54.8 kg)  10/22/13 123 lb 10.9 oz (56.1 kg)  10/12/13 115 lb (52.164 kg)  04/29/13 130 lb 12.8 oz (59.33 kg)  04/15/13 126 lb (57.153 kg)  04/07/13 125 lb (56.7 kg)  04/07/13 125 lb (56.7 kg)  04/02/13 124 lb 11.2 oz (56.564 kg)    Usual Body Weight: 127-128 lbs  % Usual Body Weight: 90-91%  BMI:  Body mass index is 19.9 kg/(m^2).  Estimated Nutritional Needs: Kcal: 1400-1600 Protein: 65-75 g Fluid: >1.6 L/day  Skin: Intact  Diet Order: Clear Liquid  EDUCATION NEEDS: -Education needs addressed   Intake/Output Summary (Last 24 hours) at 12/02/13 1145 Last data filed at 12/02/13 0900  Gross per 24 hour  Intake 1349.99 ml  Output    925 ml  Net 424.99 ml    Last BM: prior to admission   Labs:   Recent Labs Lab 12/01/13 2300 12/02/13 0330 12/02/13 0700  NA 139 137 137  K 4.9 3.9 4.2  CL 105 102 104  CO2 13* 20 18*  BUN 14 12 12   CREATININE 0.43* 0.50 0.46*  CALCIUM 8.9 8.9 8.9  GLUCOSE 155* 161* 178*  CBG (last 3)   Recent Labs  12/02/13 0749 12/02/13 0856 12/02/13 1125  GLUCAP 165* 205* 39*    Scheduled Meds: . sodium chloride   Intravenous STAT  . aspirin EC  81 mg Oral Daily  . divalproex  250 mg Oral Q12H  . enoxaparin (LOVENOX) injection  40 mg Subcutaneous Q24H  . gabapentin  600 mg Oral TID  . insulin aspart  0-9 Units Subcutaneous TID WC  . insulin NPH Human  18 Units Subcutaneous BID AC & HS  . metoCLOPramide  10 mg Oral TID AC & HS  . risperidone  2 mg Oral QHS    Continuous Infusions: . sodium chloride 1,000 mL (12/02/13 1132)  . dextrose 5 % and 0.45% NaCl 50 mL/hr at 12/01/13 1630    Past Medical  History  Diagnosis Date  . Kidney stone   . Seizure disorder     started with pregnancy of first son  . Gastritis   . Bipolar 2 disorder   . Post traumatic stress disorder     "flipping out" after people close to her died  . Heart murmur   . Arthritis     r ankle-S/P surgery (2007)  . Anemia     childhood  . Depression     childhood  . Hepatitis C     biopsy in 2010-no rx-was supposed to see a hepatologist in [place].  With cirrhosis  . Neuropathy     in legs and feet and hands  . Cataracts, bilateral   . Scoliosis   . Diabetes mellitus     diagnosed in 1996-always been on insulin  . DKA (diabetic ketoacidoses)     Recurrent admissions for DKA, medication non-complaince, poor social situation  . Diabetic neuropathy, type I diabetes mellitus     numbness bilaterally feet  . HTN (hypertension) 10/12/2013    Past Surgical History  Procedure Laterality Date  . Ankle surgery  2007  . Endometrial biopsy  06/22/2012  . Cholecystectomy  04/07/2013  . Cholecystectomy N/A 04/07/2013    Procedure: LAPAROSCOPIC CHOLECYSTECTOMY WITH INTRAOPERATIVE CHOLANGIOGRAM;  Surgeon: Adin Hector, MD;  Location: [facility];  Service: General;  Laterality: N/A;  . Cesarean section      3 times    Terrace Arabia RD, LDN

## 2013-12-02 NOTE — Progress Notes (Signed)
CBG 39 while transitioning off insulin drip.  Drip off, skim milk 8 ounces given with graham crackers and teaspoon of PB.  Repeat CBG 76.

## 2013-12-02 NOTE — Progress Notes (Signed)
Staff RN paged Diabetes Coordinator concerning the transition off glucostabilizer with the order for 70/30 insulin.  Patient has gastroparesis and would be starting on clear liquids.  Called Dr. Renne Crigler to see if the order could be changed to NPH instead of 70/30 2 hours before transitioning off drip.  The order was changed to NPH 18 units BID and the Novolog correction scale in place when drip is discontinued.   Patient has been seen by our department numerous times.  Patient has reported that she takes her insulin at home.  Will continue to follow while in hospital.  Harvel Ricks RN BSN CDE

## 2013-12-02 NOTE — Progress Notes (Signed)
Spoke with patient about her diabetes and medications.  States that the 70/30 mixed insulin is not working.  States that she does take her insulin.  Would like to be back on Lantus since it seems to work better for her, especially with gastroparesis. Has been seen in [facility] in the past, but has not been able to keep appointments due to her being sick.  Needs to get paperwork together so that she can get the orange card.  Will continue to follow while in hospital.  Harvel Ricks RN BSN CDE

## 2013-12-03 DIAGNOSIS — E1310 Other specified diabetes mellitus with ketoacidosis without coma: Secondary | ICD-10-CM | POA: Diagnosis not present

## 2013-12-03 LAB — CBC
HCT: 33 % — ABNORMAL LOW (ref 36.0–46.0)
Hemoglobin: 10.4 g/dL — ABNORMAL LOW (ref 12.0–15.0)
MCH: 22.7 pg — ABNORMAL LOW (ref 26.0–34.0)
MCHC: 31.5 g/dL (ref 30.0–36.0)
MCV: 72.1 fL — ABNORMAL LOW (ref 78.0–100.0)
Platelets: 156 10*3/uL (ref 150–400)
RBC: 4.58 MIL/uL (ref 3.87–5.11)
RDW: 14.9 % (ref 11.5–15.5)
WBC: 10.2 10*3/uL (ref 4.0–10.5)

## 2013-12-03 LAB — BASIC METABOLIC PANEL
BUN: 9 mg/dL (ref 6–23)
CHLORIDE: 106 meq/L (ref 96–112)
CO2: 20 mEq/L (ref 19–32)
Calcium: 8.7 mg/dL (ref 8.4–10.5)
Creatinine, Ser: 0.51 mg/dL (ref 0.50–1.10)
GFR calc non Af Amer: >90 mL/min (ref >90)
Glucose, Bld: 205 mg/dL — ABNORMAL HIGH (ref 70–99)
Potassium: 3.6 mEq/L — ABNORMAL LOW (ref 3.7–5.3)
Sodium: 138 mEq/L (ref 137–147)

## 2013-12-03 LAB — GLUCOSE, CAPILLARY
Glucose-Capillary: 215 mg/dL — ABNORMAL HIGH (ref 70–99)
Glucose-Capillary: 229 mg/dL — ABNORMAL HIGH (ref 70–99)

## 2013-12-03 NOTE — Discharge Summary (Signed)
Physician Discharge Summary  Mary [name] YTK:[id number] DOB: 13-Aug-1970 DOA: 12/01/2013  PCP: Mary Ewings, FNP  Admit date: 12/01/2013 Discharge date: 12/03/2013  Time spent: 35 minutes  Recommendations for Outpatient Follow-up:  1. Follow up with family clinic today or on Monday   Discharge Diagnoses:  Active Problems:   Seizure disorder   Non compliance w medication regimen   Bipolar 1 disorder   DM (diabetes mellitus), type 1, uncontrolled   DKA (diabetic ketoacidoses)   Gastroparesis due to DM   Chronic abdominal pain   HTN (hypertension)   Protein-calorie malnutrition, severe  Discharge Condition: stable  Diet recommendation: carb modified  Filed Weights   12/01/13 1435 12/02/13 0400  Weight: 51.4 kg (113 lb 5.1 oz) 52.6 kg (115 lb 15.4 oz)   History of present illness:  Mary [name] is a 43 y.o. female has a past medical history significant for poorly controlled DM with multiple hospitalization for DKA, seizure disorder, hypertension, presents to the emergency room with a chief complaint of severe abdominal pain that started last night progressively worse this morning when she decided to come in. She has a history of gastroparesis and has had multiple hospitalizations for this as well. She states her abdominal pain is midepigastric going "all over", similar to her abdominal pain that she experienced in the past. She denies any fever or chills. She denies any burning with urination, denies any skin rashes or skin ulcers. She has no cough or breathing difficulties. She denies any chest pain, she endorses nausea and inability for any by mouth intake for the past day. She denies missing insulin, however has been running high sugars for the past day, yesterday morning she tells me her blood sugar was "about 500". She took 30 units of 70/30 insulin instead of 25. She usually does that when she is hyperglycemic. She is not sure why she became this hyperglycemic as she has not  changed her diet and reports compliance with her insulin regimen. She did not take her insulin the day of admission since she was feeling sick.  Hospital Course:  Patient with multiple admissions for DKA and gastroparesis, likely due to non-compliance. She was admitted to SDU initially on an insulin gtt and was transitioned off of the drip the next morning as her gap has improved and bicarbonate normalized. Her abdominal pain, nausea/vomiting have resolved and patient was able to tolerate a carb modified diet without further issues, asking to go home. She has been on Lantus in the past however can no longer afford that. I called her PCP's office and discussed with Mary [name] nurse who has mentioned that all the patient needs to do is come to her appointments, and she will try to set up again financial help with her home insulin. For now I will not change her home regimen since I don't think she will be able to afford a different insulin. Her home regimen is likely to work well I suspect that she is non compliant with insulin administration as she admitted to miss a dose just prior to coming to the hospital. Unfortunately she is at high risk for readmissions in the future. Her other medical problems have remained stable and no medications were adjusted.   Procedures:  None    Consultations:  None   Discharge Exam: Filed Vitals:   12/02/13 2051 12/03/13 0215 12/03/13 0622 12/03/13 1006  BP: 149/89 134/82 121/81 123/79  Pulse: 90 88 84 84  Temp: 98.1 F (36.7 C)  98.6 F (37 C) 98.5 F (36.9 C)  TempSrc: Oral Oral Oral Oral  Resp: 18 16 16 18   Height:      Weight:      SpO2: 99% 98% 99% 99%   General: NAD Cardiovascular: RRR Respiratory: CTA biL  Discharge Instructions    Medication List         aspirin EC 81 MG tablet  Take 81 mg by mouth daily.     divalproex 250 MG DR tablet  Commonly known as:  DEPAKOTE  Take 1 tablet (250 mg total) by mouth every 12 (twelve) hours.       gabapentin 400 MG capsule  Commonly known as:  NEURONTIN  Take 2 capsules (800 mg total) by mouth 3 (three) times daily.     hyoscyamine 0.125 MG SL tablet  Commonly known as:  LEVSIN SL  Place 1 tablet (0.125 mg total) under the tongue 3 (three) times daily.     insulin NPH-regular Human (70-30) 100 UNIT/ML injection  Commonly known as:  NOVOLIN 70/30  Inject 35 Units into the skin 2 (two) times daily with a meal.     INSULIN SYRINGE .5CC/28G 28G X 1/2" 0.5 ML Misc  1 Syringe by Does not apply route 2 (two) times daily.     metFORMIN 1000 MG tablet  Commonly known as:  GLUCOPHAGE  Take 1,000 mg by mouth 2 (two) times daily.     metoCLOPramide 10 MG tablet  Commonly known as:  REGLAN  Take 1 tablet (10 mg total) by mouth 4 (four) times daily -  before meals and at bedtime.     Potassium Chloride ER 20 MEQ Tbcr  Take 20 mEq by mouth daily.     risperidone 4 MG tablet  Commonly known as:  RISPERDAL  Take 0.5 tablets (2 mg total) by mouth at bedtime.       Follow-up Information   Follow up with [facility] [place]. Schedule an appointment as soon as possible for a visit in 1 week.   Specialty:  Nurse Practitioner   Contact information:   [address] [place] [address] [phone]       The results of significant diagnostics from this hospitalization (including imaging, microbiology, ancillary and laboratory) are listed below for reference.    Significant Diagnostic Studies: Dg Abd 2 Views  12/01/2013   CLINICAL DATA:  Abdominal pain, nausea and vomiting  EXAM: ABDOMEN - 2 VIEW  COMPARISON:  Radiograph 11/21/2013  FINDINGS: Left lateral decubitus tissue demonstrates no intraperitoneal free air. Lungs are clear. Supine exam demonstrates no dilated [name] or small bowel.  IMPRESSION: No bowel obstruction or intraperitoneal free air.   Electronically Signed   By: Suzy Bouchard M.D.   On: 12/01/2013 11:01   Dg Abd Acute W/chest  11/21/2013   CLINICAL DATA:   Abdominal pain beginning today.  EXAM: ACUTE ABDOMEN SERIES (ABDOMEN 2 VIEW & CHEST 1 VIEW)  COMPARISON:  Chest in two views abdomen 10/21/2013.  FINDINGS: Single view of the chest demonstrates clear lungs and normal heart size. No pneumothorax or pleural effusion.  Two views of the abdomen show no free intraperitoneal air. The bowel gas pattern is normal. Cholecystectomy clips and thoracolumbar scoliosis are noted.  IMPRESSION: No acute finding.  Scoliosis.  Status post cholecystectomy.   Electronically Signed   By: Inge Rise M.D.   On: 11/21/2013 16:40   Labs: Basic Metabolic Panel:  Recent Labs Lab 12/01/13 1917 12/01/13 2300 12/02/13 0330 12/02/13  0700 12/03/13 0425  NA 140 139 137 137 138  K 5.3 4.9 3.9 4.2 3.6*  CL 106 105 102 104 106  CO2 15* 13* 20 18* 20  GLUCOSE 127* 155* 161* 178* 205*  BUN 14 14 12 12 9   CREATININE 0.41* 0.43* 0.50 0.46* 0.51  CALCIUM 8.9 8.9 8.9 8.9 8.7   Liver Function Tests:  Recent Labs Lab 12/01/13 0954  AST 49*  ALT 46*  ALKPHOS 101  BILITOT 0.3  PROT 8.8*  ALBUMIN 4.4    Recent Labs Lab 12/01/13 0954  LIPASE 22   CBC:  Recent Labs Lab 12/01/13 1149 12/03/13 0425  WBC 11.9* 10.2  NEUTROABS 9.1*  --   HGB 12.5 10.4*  HCT 40.4 33.0*  MCV 72.9* 72.1*  PLT 172 156   CBG:  Recent Labs Lab 12/02/13 1152 12/02/13 1655 12/02/13 2053 12/03/13 0746 12/03/13 1130  GLUCAP 76 338* 216* 215* 229*   Signed:  GHERGHE, COSTIN  [facility] 12/03/2013, 2:31 PM

## 2013-12-03 NOTE — Progress Notes (Signed)
Patient given discharge instructions, and verbalized an understanding of all discharge instructions.  Patient agrees with discharge plan, and is being discharged in stable medical condition.  Patient given transportation via wheelchair.  Squire Withey RN 

## 2013-12-03 NOTE — Discharge Instructions (Signed)

## 2013-12-03 NOTE — Care Management Note (Unsigned)
    Page 1 of 1   12/03/2013     1:21:01 PM CARE MANAGEMENT NOTE 12/03/2013  Patient:  Mary [name], Mary [name]   Account Number:  [id number]  Date Initiated:  12/02/2013  Documentation initiated by:  DAVIS,RHONDA  Subjective/Objective Assessment:   pt with hx of dka admitted in dka, iv insulin required     Action/Plan:   home when stable   Anticipated DC Date:  12/03/2013   Anticipated DC Plan:  [place]  CM consult      Choice offered to / List presented to:             Status of service:  Completed, signed off Medicare Important Message given?   (If response is "NO", the following Medicare IM given date fields will be blank) Date Medicare IM given:   Date Additional Medicare IM given:    Discharge Disposition:  HOME/SELF CARE  Per UR Regulation:  Reviewed for med. necessity/level of care/duration of stay  If discussed at [place] of Stay Meetings, dates discussed:    Comments:  12/03/13 Allene Dillon RN BSN [phone] Met with pt at bedside to discuss her frequent hospitalizations and OP follow up care. She is seen at [facility]. She stated she missed an appt and that they also rescheduled one. She gets her medications filled there as well. When I have spoken with [facility] in the past, they informed me that she just needs to walk in after d/c since she has established care with them already. I advised pt to do that today. She mentioned going there on Monday but I re-enforced the importance of going there today and being compliant with her appointments and insulin regimen. She verbilixed understanding.

## 2013-12-22 ENCOUNTER — Encounter (HOSPITAL_COMMUNITY): Payer: Self-pay | Admitting: Emergency Medicine

## 2013-12-22 ENCOUNTER — Inpatient Hospital Stay (HOSPITAL_COMMUNITY): Payer: Medicaid Other

## 2013-12-22 ENCOUNTER — Inpatient Hospital Stay (HOSPITAL_COMMUNITY)
Admission: EM | Admit: 2013-12-22 | Discharge: 2013-12-25 | DRG: 639 | Disposition: A | Discharge status: Home / Self Care | Payer: Medicaid Other | Attending: Internal Medicine | Admitting: Internal Medicine

## 2013-12-22 DIAGNOSIS — Z91199 Patient's noncompliance with other medical treatment and regimen due to unspecified reason: Secondary | ICD-10-CM | POA: Diagnosis not present

## 2013-12-22 DIAGNOSIS — G40909 Epilepsy, unspecified, not intractable, without status epilepticus: Secondary | ICD-10-CM | POA: Diagnosis present

## 2013-12-22 DIAGNOSIS — F1210 Cannabis abuse, uncomplicated: Secondary | ICD-10-CM | POA: Diagnosis present

## 2013-12-22 DIAGNOSIS — D509 Iron deficiency anemia, unspecified: Secondary | ICD-10-CM | POA: Diagnosis present

## 2013-12-22 DIAGNOSIS — Z794 Long term (current) use of insulin: Secondary | ICD-10-CM

## 2013-12-22 DIAGNOSIS — E1049 Type 1 diabetes mellitus with other diabetic neurological complication: Secondary | ICD-10-CM | POA: Diagnosis present

## 2013-12-22 DIAGNOSIS — Z9089 Acquired absence of other organs: Secondary | ICD-10-CM | POA: Diagnosis not present

## 2013-12-22 DIAGNOSIS — F319 Bipolar disorder, unspecified: Secondary | ICD-10-CM | POA: Diagnosis present

## 2013-12-22 DIAGNOSIS — Z9119 Patient's noncompliance with other medical treatment and regimen: Secondary | ICD-10-CM

## 2013-12-22 DIAGNOSIS — E1065 Type 1 diabetes mellitus with hyperglycemia: Secondary | ICD-10-CM | POA: Diagnosis present

## 2013-12-22 DIAGNOSIS — Z880 Allergy status to penicillin: Secondary | ICD-10-CM

## 2013-12-22 DIAGNOSIS — Z9114 Patient's other noncompliance with medication regimen: Secondary | ICD-10-CM

## 2013-12-22 DIAGNOSIS — R1115 Cyclical vomiting syndrome unrelated to migraine: Secondary | ICD-10-CM

## 2013-12-22 DIAGNOSIS — D72829 Elevated white blood cell count, unspecified: Secondary | ICD-10-CM | POA: Diagnosis present

## 2013-12-22 DIAGNOSIS — Z79899 Other long term (current) drug therapy: Secondary | ICD-10-CM

## 2013-12-22 DIAGNOSIS — Z7982 Long term (current) use of aspirin: Secondary | ICD-10-CM

## 2013-12-22 DIAGNOSIS — E1149 Type 2 diabetes mellitus with other diabetic neurological complication: Secondary | ICD-10-CM | POA: Diagnosis not present

## 2013-12-22 DIAGNOSIS — K219 Gastro-esophageal reflux disease without esophagitis: Secondary | ICD-10-CM | POA: Diagnosis present

## 2013-12-22 DIAGNOSIS — E1010 Type 1 diabetes mellitus with ketoacidosis without coma: Principal | ICD-10-CM

## 2013-12-22 DIAGNOSIS — E1143 Type 2 diabetes mellitus with diabetic autonomic (poly)neuropathy: Secondary | ICD-10-CM | POA: Diagnosis present

## 2013-12-22 DIAGNOSIS — E1142 Type 2 diabetes mellitus with diabetic polyneuropathy: Secondary | ICD-10-CM | POA: Diagnosis present

## 2013-12-22 DIAGNOSIS — IMO0002 Reserved for concepts with insufficient information to code with codable children: Secondary | ICD-10-CM | POA: Diagnosis not present

## 2013-12-22 DIAGNOSIS — Z833 Family history of diabetes mellitus: Secondary | ICD-10-CM

## 2013-12-22 DIAGNOSIS — R1110 Vomiting, unspecified: Secondary | ICD-10-CM

## 2013-12-22 DIAGNOSIS — Z91148 Patient's other noncompliance with medication regimen for other reason: Secondary | ICD-10-CM

## 2013-12-22 DIAGNOSIS — F17200 Nicotine dependence, unspecified, uncomplicated: Secondary | ICD-10-CM | POA: Diagnosis present

## 2013-12-22 DIAGNOSIS — R109 Unspecified abdominal pain: Secondary | ICD-10-CM | POA: Diagnosis not present

## 2013-12-22 DIAGNOSIS — K5900 Constipation, unspecified: Secondary | ICD-10-CM | POA: Diagnosis present

## 2013-12-22 DIAGNOSIS — F4310 Post-traumatic stress disorder, unspecified: Secondary | ICD-10-CM | POA: Diagnosis present

## 2013-12-22 DIAGNOSIS — K3184 Gastroparesis: Secondary | ICD-10-CM | POA: Diagnosis present

## 2013-12-22 DIAGNOSIS — R1112 Projectile vomiting: Secondary | ICD-10-CM

## 2013-12-22 DIAGNOSIS — G8929 Other chronic pain: Secondary | ICD-10-CM | POA: Diagnosis present

## 2013-12-22 DIAGNOSIS — I10 Essential (primary) hypertension: Secondary | ICD-10-CM | POA: Diagnosis present

## 2013-12-22 DIAGNOSIS — E43 Unspecified severe protein-calorie malnutrition: Secondary | ICD-10-CM

## 2013-12-22 DIAGNOSIS — R112 Nausea with vomiting, unspecified: Secondary | ICD-10-CM | POA: Diagnosis present

## 2013-12-22 DIAGNOSIS — Z87442 Personal history of urinary calculi: Secondary | ICD-10-CM | POA: Diagnosis not present

## 2013-12-22 DIAGNOSIS — Z720 Tobacco use: Secondary | ICD-10-CM

## 2013-12-22 DIAGNOSIS — B1920 Unspecified viral hepatitis C without hepatic coma: Secondary | ICD-10-CM | POA: Diagnosis present

## 2013-12-22 DIAGNOSIS — E1110 Type 2 diabetes mellitus with ketoacidosis without coma: Secondary | ICD-10-CM | POA: Diagnosis present

## 2013-12-22 LAB — CBG MONITORING, ED
GLUCOSE-CAPILLARY: 391 mg/dL — AB (ref 70–99)
Glucose-Capillary: 596 mg/dL (ref 70–99)

## 2013-12-22 LAB — COMPREHENSIVE METABOLIC PANEL
ALT: 61 U/L — AB (ref 0–35)
ANION GAP: 40 — AB (ref 5–15)
AST: 79 U/L — ABNORMAL HIGH (ref 0–37)
Albumin: 4.9 g/dL (ref 3.5–5.2)
Alkaline Phosphatase: 110 U/L (ref 39–117)
BUN: 36 mg/dL — ABNORMAL HIGH (ref 6–23)
CALCIUM: 10.5 mg/dL (ref 8.4–10.5)
CO2: 9 mEq/L — CL (ref 19–32)
CREATININE: 0.98 mg/dL (ref 0.50–1.10)
Chloride: 90 mEq/L — ABNORMAL LOW (ref 96–112)
GFR calc non Af Amer: 70 mL/min — ABNORMAL LOW (ref >90)
GFR, EST AFRICAN AMERICAN: 81 mL/min — AB (ref >90)
GLUCOSE: 696 mg/dL — AB (ref 70–99)
Potassium: 6.4 mEq/L — ABNORMAL HIGH (ref 3.7–5.3)
SODIUM: 139 meq/L (ref 137–147)
TOTAL PROTEIN: 10 g/dL — AB (ref 6.0–8.3)
Total Bilirubin: 0.5 mg/dL (ref 0.3–1.2)

## 2013-12-22 LAB — CBC WITH DIFFERENTIAL/PLATELET
Basophils Absolute: 0 10*3/uL (ref 0.0–0.1)
Basophils Relative: 0 % (ref 0–1)
EOS ABS: 0 10*3/uL (ref 0.0–0.7)
Eosinophils Relative: 0 % (ref 0–5)
HCT: 46.2 % — ABNORMAL HIGH (ref 36.0–46.0)
Hemoglobin: 14.5 g/dL (ref 12.0–15.0)
Lymphocytes Relative: 18 % (ref 12–46)
Lymphs Abs: 3.8 10*3/uL (ref 0.7–4.0)
MCH: 23.7 pg — ABNORMAL LOW (ref 26.0–34.0)
MCHC: 31.4 g/dL (ref 30.0–36.0)
MCV: 75.4 fL — AB (ref 78.0–100.0)
MONO ABS: 1.2 10*3/uL — AB (ref 0.1–1.0)
Monocytes Relative: 5 % (ref 3–12)
Neutro Abs: 16.7 10*3/uL — ABNORMAL HIGH (ref 1.7–7.7)
Neutrophils Relative %: 77 % (ref 43–77)
PLATELETS: 299 10*3/uL (ref 150–400)
RBC: 6.13 MIL/uL — ABNORMAL HIGH (ref 3.87–5.11)
RDW: 17.4 % — AB (ref 11.5–15.5)
WBC: 21.7 10*3/uL — ABNORMAL HIGH (ref 4.0–10.5)

## 2013-12-22 LAB — I-STAT ARTERIAL BLOOD GAS, ED
Acid-base deficit: 17 mmol/L — ABNORMAL HIGH (ref 0.0–2.0)
Bicarbonate: 8 mEq/L — ABNORMAL LOW (ref 20.0–24.0)
O2 Saturation: 99 %
TCO2: 9 mmol/L (ref 0–100)
pCO2 arterial: 19.3 mmHg — CL (ref 35.0–45.0)
pH, Arterial: 7.222 — ABNORMAL LOW (ref 7.350–7.450)
pO2, Arterial: 152 mmHg — ABNORMAL HIGH (ref 80.0–100.0)

## 2013-12-22 LAB — GLUCOSE, CAPILLARY: Glucose-Capillary: 339 mg/dL — ABNORMAL HIGH (ref 70–99)

## 2013-12-22 LAB — TROPONIN I: Troponin I: <0.3 ng/mL (ref <0.30)

## 2013-12-22 LAB — I-STAT CG4 LACTIC ACID, ED: LACTIC ACID, VENOUS: 5.25 mmol/L — AB (ref 0.5–2.2)

## 2013-12-22 MED ORDER — ONDANSETRON HCL 4 MG/2ML IJ SOLN
4.0000 mg | Freq: Once | INTRAMUSCULAR | Status: AC
  Administered 2013-12-22: 4 mg via INTRAVENOUS
  Filled 2013-12-22: qty 2

## 2013-12-22 MED ORDER — DEXTROSE-NACL 5-0.45 % IV SOLN
INTRAVENOUS | Status: DC
  Administered 2013-12-23 (×2): via INTRAVENOUS

## 2013-12-22 MED ORDER — SODIUM CHLORIDE 0.9 % IV SOLN
Freq: Once | INTRAVENOUS | Status: AC
  Administered 2013-12-22: 22:00:00 via INTRAVENOUS

## 2013-12-22 MED ORDER — ONDANSETRON HCL 4 MG/2ML IJ SOLN
4.0000 mg | Freq: Three times a day (TID) | INTRAMUSCULAR | Status: DC | PRN
  Administered 2013-12-22: 4 mg via INTRAVENOUS
  Filled 2013-12-22: qty 2

## 2013-12-22 MED ORDER — DEXTROSE 50 % IV SOLN: 25.0000 mL | INTRAVENOUS | Status: DC | PRN

## 2013-12-22 MED ORDER — SODIUM CHLORIDE 0.9 % IV BOLUS (SEPSIS)
2000.0000 mL | Freq: Once | INTRAVENOUS | Status: AC
  Administered 2013-12-22: 1000 mL via INTRAVENOUS

## 2013-12-22 MED ORDER — MORPHINE SULFATE 4 MG/ML IJ SOLN
4.0000 mg | Freq: Once | INTRAMUSCULAR | Status: AC
  Administered 2013-12-22: 4 mg via INTRAVENOUS
  Filled 2013-12-22: qty 1

## 2013-12-22 MED ORDER — ENOXAPARIN SODIUM 40 MG/0.4ML ~~LOC~~ SOLN
40.0000 mg | Freq: Every day | SUBCUTANEOUS | Status: DC
  Administered 2013-12-22 – 2013-12-24 (×3): 40 mg via SUBCUTANEOUS
  Filled 2013-12-22 (×4): qty 0.4

## 2013-12-22 MED ORDER — HYDROMORPHONE HCL PF 1 MG/ML IJ SOLN
1.0000 mg | INTRAMUSCULAR | Status: DC | PRN
  Administered 2013-12-23 – 2013-12-24 (×5): 1 mg via INTRAVENOUS
  Filled 2013-12-22 (×5): qty 1

## 2013-12-22 MED ORDER — HYDROMORPHONE HCL PF 1 MG/ML IJ SOLN
1.0000 mg | INTRAMUSCULAR | Status: DC | PRN
  Administered 2013-12-22: 1 mg via INTRAVENOUS
  Filled 2013-12-22: qty 1

## 2013-12-22 MED ORDER — INSULIN ASPART 100 UNIT/ML ~~LOC~~ SOLN
10.0000 [IU] | Freq: Once | SUBCUTANEOUS | Status: AC
  Administered 2013-12-22: 10 [IU] via INTRAVENOUS
  Filled 2013-12-22: qty 1

## 2013-12-22 MED ORDER — POTASSIUM CHLORIDE 10 MEQ/100ML IV SOLN
10.0000 meq | INTRAVENOUS | Status: AC
  Administered 2013-12-22 – 2013-12-23 (×4): 10 meq via INTRAVENOUS
  Filled 2013-12-22 (×4): qty 100

## 2013-12-22 MED ORDER — SODIUM CHLORIDE 0.9 % IV SOLN
INTRAVENOUS | Status: DC
  Administered 2013-12-22: 3.3 [IU]/h via INTRAVENOUS
  Filled 2013-12-22: qty 1

## 2013-12-22 MED ORDER — INSULIN ASPART 100 UNIT/ML IV SOLN: 10.0000 [IU] | Freq: Once | INTRAVENOUS | Status: DC

## 2013-12-22 MED ORDER — PANTOPRAZOLE SODIUM 40 MG IV SOLR
40.0000 mg | INTRAVENOUS | Status: DC
  Administered 2013-12-22: 40 mg via INTRAVENOUS
  Filled 2013-12-22 (×2): qty 40

## 2013-12-22 MED ORDER — SODIUM CHLORIDE 0.9 % IV SOLN
INTRAVENOUS | Status: DC
Start: 2013-12-22 — End: 2013-12-25
  Administered 2013-12-24 (×3): via INTRAVENOUS

## 2013-12-22 MED ORDER — SODIUM CHLORIDE 0.9 % IV SOLN: INTRAVENOUS | Status: AC

## 2013-12-22 MED ORDER — SODIUM CHLORIDE 0.9 % IV SOLN
INTRAVENOUS | Status: DC
  Filled 2013-12-22: qty 1

## 2013-12-22 NOTE — ED Notes (Signed)
Dr. Kakrakandy at bedside. 

## 2013-12-22 NOTE — ED Notes (Addendum)
Per EMS- Pt come from home, is Type 1 diabetic, n/v since yesterday, took insulin yesterday. Also substernal CP since yesterday, non-radiating. Given 324 aspirin, 1 nitro, nitro relieved pain cp. HR 160, BP 112/72, CBG 553. EKG ST with peaked t-waves, unable to establish IV.

## 2013-12-22 NOTE — ED Notes (Signed)
IV team here at bedside.

## 2013-12-22 NOTE — ED Notes (Signed)
CBG Taken = 391

## 2013-12-22 NOTE — H&P (Signed)
[facility] History and Physical  Mary [name] GEX:[id number] DOB: 12-03-1970 DOA: 12/22/2013  Referring physician: ER physician. PCP: Elbert Ewings, FNP   Chief Complaint: Nausea vomiting.  HPI: Mary [name] is a 43 y.o. female with history of diabetes mellitus, bipolar disorder, hepatitis C presents to the ER with complaints of persistent nausea vomiting over the past 24-48 hours. Patient also has developed retrosternal chest pain after nausea vomiting started. Patient states that she was not taking her insulin after nausea vomiting had started. In the ER labs revealed elevated blood sugar with anion gap acidosis consistent with DKA. Patient has been started on IV fluids boluses and IV insulin infusion. Patient was admitted last month for DKA. Patient's abdomen is benign on exam and EKG shows sinus tachycardia with troponins negative. Patient does have history of possible gastroparesis.   Review of Systems: As presented in the history of presenting illness, rest negative.  Past Medical History  Diagnosis Date  . Kidney stone   . Seizure disorder     started with pregnancy of first son  . Gastritis   . Bipolar 2 disorder   . Post traumatic stress disorder     "flipping out" after people close to her died  . Heart murmur   . Arthritis     r ankle-S/P surgery (2007)  . Anemia     childhood  . Depression     childhood  . Hepatitis C     biopsy in 2010-no rx-was supposed to see a hepatologist in [place].  With cirrhosis  . Neuropathy     in legs and feet and hands  . Cataracts, bilateral   . Scoliosis   . Diabetes mellitus     diagnosed in 1996-always been on insulin  . DKA (diabetic ketoacidoses)     Recurrent admissions for DKA, medication non-complaince, poor social situation  . Diabetic neuropathy, type I diabetes mellitus     numbness bilaterally feet  . HTN (hypertension) 10/12/2013   Past Surgical History  Procedure Laterality Date  . Ankle surgery   2007  . Endometrial biopsy  06/22/2012  . Cholecystectomy  04/07/2013  . Cholecystectomy N/A 04/07/2013    Procedure: LAPAROSCOPIC CHOLECYSTECTOMY WITH INTRAOPERATIVE CHOLANGIOGRAM;  Surgeon: Adin Hector, MD;  Location: [facility];  Service: General;  Laterality: N/A;  . Cesarean section      3 times   Social History:  reports that she has been smoking Cigarettes.  She has a 2.5 pack-year smoking history. She has never used smokeless tobacco. She reports that she uses illicit drugs (Marijuana) about once per week. She reports that she does not drink alcohol. Where does patient live at home. Can patient participate in ADLs? Yes.  Allergies  Allergen Reactions  . Penicillins Rash    Family History:  Family History  Problem Relation Age of Onset  . Diabetes Mother     currently 17  . Fibromyalgia Mother   . Cirrhosis Father     died in 9  . Diabetes Maternal Grandmother   . Diabetes Maternal Aunt       Prior to Admission medications   Medication Sig Start Date End Date Taking? Authorizing Provider  aspirin EC 81 MG tablet Take 81 mg by mouth daily.     Historical Provider, MD  divalproex (DEPAKOTE) 250 MG DR tablet Take 1 tablet (250 mg total) by mouth every 12 (twelve) hours. 10/23/13   Erline Hau, MD  gabapentin (NEURONTIN) 400 MG capsule Take 2  capsules (800 mg total) by mouth 3 (three) times daily. 10/23/13   Erline Hau, MD  hyoscyamine (LEVSIN SL) 0.125 MG SL tablet Place 1 tablet (0.125 mg total) under the tongue 3 (three) times daily. 10/14/13   Theodis Blaze, MD  insulin NPH-regular Human (NOVOLIN 70/30) (70-30) 100 UNIT/ML injection Inject 35 Units into the skin 2 (two) times daily with a meal. 11/24/13   Belkys A Regalado, MD  metFORMIN (GLUCOPHAGE) 1000 MG tablet Take 1,000 mg by mouth 2 (two) times daily.    Historical Provider, MD  metoCLOPramide (REGLAN) 10 MG tablet Take 1 tablet (10 mg total) by mouth 4 (four) times daily -  before meals and  at bedtime. 11/04/13   Costin Karlyne Greenspan, MD  potassium chloride 20 MEQ TBCR Take 20 mEq by mouth daily. 11/24/13   Belkys A Regalado, MD  risperidone (RISPERDAL) 4 MG tablet Take 0.5 tablets (2 mg total) by mouth at bedtime. 10/23/13   Erline Hau, MD    Physical Exam: Filed Vitals:   12/22/13 1906 12/22/13 1915 12/22/13 1930 12/22/13 1945  BP: 124/69 128/71 117/70 119/68  Pulse: 137     Temp: 97.5 F (36.4 C)     TempSrc: Oral     Resp: 23 17 13 14   Height: 5\' 4"  (1.626 m)     Weight: 51.256 kg (113 lb)     SpO2: 100%        General:  Well-developed and moderately nourished.  Eyes: Anicteric no pallor.  ENT: No discharge from ears eyes nose mouth.  Neck: No mass felt.  Cardiovascular: S1-S2 heard tachycardic.  Respiratory: No rhonchi or crepitations.  Abdomen: Soft nontender bowel sounds present. No guarding or rigidity.  Skin: No rash.  Musculoskeletal: No edema.  Psychiatric: Appears normal.  Neurologic: A oriented to time place and person. Moves all extremities.  Labs on Admission:  Basic Metabolic Panel:  Recent Labs Lab 12/22/13 1845  NA 139  K 6.4*  CL 90*  CO2 9*  GLUCOSE 696*  BUN 36*  CREATININE 0.98  CALCIUM 10.5   Liver Function Tests:  Recent Labs Lab 12/22/13 1845  AST 79*  ALT 61*  ALKPHOS 110  BILITOT 0.5  PROT 10.0*  ALBUMIN 4.9   No results found for this basename: LIPASE, AMYLASE,  in the last 168 hours No results found for this basename: AMMONIA,  in the last 168 hours CBC:  Recent Labs Lab 12/22/13 1845  WBC 21.7*  NEUTROABS 16.7*  HGB 14.5  HCT 46.2*  MCV 75.4*  PLT 299   Cardiac Enzymes:  Recent Labs Lab 12/22/13 1845  TROPONINI <0.30    BNP (last 3 results) No results found for this basename: PROBNP,  in the last 8760 hours CBG:  Recent Labs Lab 12/22/13 1821  GLUCAP 596*    Radiological Exams on Admission: No results found.  EKG: Independently reviewed. Sinus  tachycardia.  Assessment/Plan Principal Problem:   Diabetic ketoacidosis Active Problems:   Bipolar disorder   DKA (diabetic ketoacidoses)   Leukocytosis, unspecified   Nausea with vomiting   1. Diabetic ketoacidosis - probably secondary to noncompliance. Last hemoglobin A1c in April was 13. Patient has been started on DKA protocol with aggressive IV fluid hydration and IV insulin infusion. Closely follow metabolic panel for anion gap correction. 2. Chest pain - probably related to nausea vomiting. Cycle cardiac markers given DKA. Chest x-ray pending. 3. Nausea vomiting - may be related to DKA itself or  could be from gastroparesis. KUB pending and lipase are pending. 4. Leukocytosis - probably reactionary. Patient is afebrile. Follow urine analysis. 5. History of bipolar disorder - patient states she has not been taking her medications. Presently not suicidal. 6. History of hepatitis C.    Code Status: Full code.  Family Communication: None.  Disposition Plan: Admit to inpatient step down.    KAKRAKANDY,ARSHAD N. [facility] Pager [phone].  If 7PM-7AM, please contact night-coverage [email or web address] Password [email or web address] 12/22/2013, 8:22 PM

## 2013-12-22 NOTE — ED Notes (Signed)
X-ray in with patient

## 2013-12-22 NOTE — ED Notes (Signed)
Lab results reported to EDP. 

## 2013-12-22 NOTE — ED Notes (Signed)
IV team paged.  

## 2013-12-22 NOTE — ED Notes (Signed)
IV team responded to call.

## 2013-12-22 NOTE — Progress Notes (Signed)
RT Note: RT reported panic values to MD.

## 2013-12-22 NOTE — ED Provider Notes (Signed)
CSN: [id number]     Arrival date & time 12/22/13  1813 History   First MD Initiated Contact with Patient 12/22/13 1814     Chief Complaint  Patient presents with  . Hyperglycemia  . Chest Pain     (Consider location/radiation/quality/duration/timing/severity/associated sxs/prior Treatment) HPI Comments: Patient is a 43 year old female with history of noncompliant insulin-dependent diabetes. She presents today with complaints of nausea, vomiting, and "hurting all over". This started yesterday and has rapidly worsened. She has a history of frequent admissions with diabetic ketoacidosis. She states she has not taken her insulin in the past 2 days days and has been unable to eat or drink during the same period of time.  Patient is a 43 y.o. female presenting with hyperglycemia. The history is provided by the patient.  Hyperglycemia Blood sugar level PTA:  >500 Severity:  Severe Onset quality:  Gradual Duration:  24 hours Timing:  Constant Progression:  Worsening Chronicity:  Recurrent Relieved by:  Nothing Ineffective treatments:  None tried   Past Medical History  Diagnosis Date  . Kidney stone   . Seizure disorder     started with pregnancy of first son  . Gastritis   . Bipolar 2 disorder   . Post traumatic stress disorder     "flipping out" after people close to her died  . Heart murmur   . Arthritis     r ankle-S/P surgery (2007)  . Anemia     childhood  . Depression     childhood  . Hepatitis C     biopsy in 2010-no rx-was supposed to see a hepatologist in [place].  With cirrhosis  . Neuropathy     in legs and feet and hands  . Cataracts, bilateral   . Scoliosis   . Diabetes mellitus     diagnosed in 1996-always been on insulin  . DKA (diabetic ketoacidoses)     Recurrent admissions for DKA, medication non-complaince, poor social situation  . Diabetic neuropathy, type I diabetes mellitus     numbness bilaterally feet  . HTN (hypertension) 10/12/2013   Past  Surgical History  Procedure Laterality Date  . Ankle surgery  2007  . Endometrial biopsy  06/22/2012  . Cholecystectomy  04/07/2013  . Cholecystectomy N/A 04/07/2013    Procedure: LAPAROSCOPIC CHOLECYSTECTOMY WITH INTRAOPERATIVE CHOLANGIOGRAM;  Surgeon: Adin Hector, MD;  Location: [facility];  Service: General;  Laterality: N/A;  . Cesarean section      3 times   Family History  Problem Relation Age of Onset  . Diabetes Mother     currently 36  . Fibromyalgia Mother   . Cirrhosis Father     died in 18  . Diabetes Maternal Grandmother   . Diabetes Maternal Aunt    History  Substance Use Topics  . Smoking status: Current Every Day Smoker -- 0.10 packs/day for 25 years    Types: Cigarettes  . Smokeless tobacco: Never Used  . Alcohol Use: No     Comment: Endorses hasn't been drinking   OB History   Grav Para Term Preterm Abortions TAB SAB Ect Mult Living   4 3 3  0 1 0 1 0 1 2     Review of Systems  Constitutional: Positive for appetite change.  All other systems reviewed and are negative.     Allergies  Penicillins  Home Medications   Prior to Admission medications   Medication Sig Start Date End Date Taking? Authorizing Provider  aspirin EC 81 MG tablet  Take 81 mg by mouth daily.     Historical Provider, MD  divalproex (DEPAKOTE) 250 MG DR tablet Take 1 tablet (250 mg total) by mouth every 12 (twelve) hours. 10/23/13   Erline Hau, MD  gabapentin (NEURONTIN) 400 MG capsule Take 2 capsules (800 mg total) by mouth 3 (three) times daily. 10/23/13   Erline Hau, MD  hyoscyamine (LEVSIN SL) 0.125 MG SL tablet Place 1 tablet (0.125 mg total) under the tongue 3 (three) times daily. 10/14/13   Theodis Blaze, MD  insulin NPH-regular Human (NOVOLIN 70/30) (70-30) 100 UNIT/ML injection Inject 35 Units into the skin 2 (two) times daily with a meal. 11/24/13   Belkys A Regalado, MD  metFORMIN (GLUCOPHAGE) 1000 MG tablet Take 1,000 mg by mouth 2 (two) times  daily.    Historical Provider, MD  metoCLOPramide (REGLAN) 10 MG tablet Take 1 tablet (10 mg total) by mouth 4 (four) times daily -  before meals and at bedtime. 11/04/13   Costin Karlyne Greenspan, MD  potassium chloride 20 MEQ TBCR Take 20 mEq by mouth daily. 11/24/13   Belkys A Regalado, MD  risperidone (RISPERDAL) 4 MG tablet Take 0.5 tablets (2 mg total) by mouth at bedtime. 10/23/13   Erline Hau, MD   There were no vitals taken for this visit. Physical Exam  Nursing note and vitals reviewed. Constitutional: She is oriented to person, place, and time. She appears well-developed and well-nourished. She appears distressed.  Patient is a 43 year old female who appears both chronically and acutely ill. She is hyperventilating and appears uncomfortable.  HENT:  Head: Normocephalic and atraumatic.  Mucous membranes are dry. The odor of ketones are present.  Neck: Normal range of motion. Neck supple.  Cardiovascular: Normal rate and regular rhythm.  Exam reveals no gallop and no friction rub.   No murmur heard. Pulmonary/Chest: Effort normal and breath sounds normal. No respiratory distress. She has no wheezes.  Abdominal: Soft. Bowel sounds are normal. She exhibits no distension. There is no tenderness.  Musculoskeletal: Normal range of motion.  Neurological: She is alert and oriented to person, place, and time.  Skin: Skin is warm and dry. She is not diaphoretic.    ED Course  Procedures (including critical care time) Labs Review Labs Reviewed  CBC WITH DIFFERENTIAL  COMPREHENSIVE METABOLIC PANEL  BLOOD GAS, ARTERIAL  LACTIC ACID, PLASMA  TROPONIN I    Imaging Review No results found.   EKG Interpretation   Date/Time:  Wednesday December 22 2013 18:59:00 EDT Ventricular Rate:  141 PR Interval:  78 QRS Duration: 60 QT Interval:  290 QTC Calculation: 444 R Axis:   77 Text Interpretation:  Sinus tachycardia Probable left atrial enlargement  RSR' in V1 or V2, probably  normal variant Probable left ventricular  hypertrophy Artifact in lead(s) I III aVR aVL aVF V1 V5 V6 Confirmed by  DELOS  MD, Sonnie Pawloski ([id number]) on 12/22/2013 11:26:56 PM      MDM   Final diagnoses:  None    Patient is a noncompliant type I diabetic who presents with generalized malaise. She appears acutely ill and is tachycardic. She appears dehydrated in the odor of ketones is present. Workup reveals a metabolic acidosis and blood sugar of 700 consistent with DKA. She was given IV fluids and glucose stabilizer was initiated. I've spoken with Dr. Hal Hope from the hospitalist service who agrees to admit.  CRITICAL CARE Performed by: Veryl Speak Total critical care time: 45 minutes Critical  care time was exclusive of separately billable procedures and treating other patients. Critical care was necessary to treat or prevent imminent or life-threatening deterioration. Critical care was time spent personally by me on the following activities: development of treatment plan with patient and/or surrogate as well as nursing, discussions with consultants, evaluation of patient's response to treatment, examination of patient, obtaining history from patient or surrogate, ordering and performing treatments and interventions, ordering and review of laboratory studies, ordering and review of radiographic studies, pulse oximetry and re-evaluation of patient's condition.     Veryl Speak, MD 12/22/13 2330

## 2013-12-23 ENCOUNTER — Inpatient Hospital Stay (HOSPITAL_COMMUNITY): Payer: Medicaid Other

## 2013-12-23 DIAGNOSIS — F319 Bipolar disorder, unspecified: Secondary | ICD-10-CM

## 2013-12-23 DIAGNOSIS — K3184 Gastroparesis: Secondary | ICD-10-CM

## 2013-12-23 DIAGNOSIS — I10 Essential (primary) hypertension: Secondary | ICD-10-CM

## 2013-12-23 DIAGNOSIS — E1149 Type 2 diabetes mellitus with other diabetic neurological complication: Secondary | ICD-10-CM

## 2013-12-23 LAB — BASIC METABOLIC PANEL
Anion gap: 14 (ref 5–15)
BUN: 30 mg/dL — ABNORMAL HIGH (ref 6–23)
BUN: 31 mg/dL — ABNORMAL HIGH (ref 6–23)
BUN: 22 mg/dL (ref 6–23)
CALCIUM: 8.6 mg/dL (ref 8.4–10.5)
CALCIUM: 9.3 mg/dL (ref 8.4–10.5)
CHLORIDE: 115 meq/L — AB (ref 96–112)
CO2: 11 mEq/L — ABNORMAL LOW (ref 19–32)
CO2: 11 mEq/L — ABNORMAL LOW (ref 19–32)
CO2: 18 mEq/L — ABNORMAL LOW (ref 19–32)
CREATININE: 0.49 mg/dL — AB (ref 0.50–1.10)
Calcium: 9.2 mg/dL (ref 8.4–10.5)
Chloride: 109 mEq/L (ref 96–112)
Chloride: 110 mEq/L (ref 96–112)
Creatinine, Ser: 0.68 mg/dL (ref 0.50–1.10)
Creatinine, Ser: 0.68 mg/dL (ref 0.50–1.10)
GFR calc Af Amer: >90 mL/min (ref >90)
GFR calc Af Amer: >90 mL/min (ref >90)
GFR calc non Af Amer: >90 mL/min (ref >90)
Glucose, Bld: 297 mg/dL — ABNORMAL HIGH (ref 70–99)
Glucose, Bld: 298 mg/dL — ABNORMAL HIGH (ref 70–99)
Glucose, Bld: 195 mg/dL — ABNORMAL HIGH (ref 70–99)
POTASSIUM: 5 meq/L (ref 3.7–5.3)
POTASSIUM: 5.1 meq/L (ref 3.7–5.3)
Potassium: 4.2 mEq/L (ref 3.7–5.3)
SODIUM: 147 meq/L (ref 137–147)
SODIUM: 147 meq/L (ref 137–147)
SODIUM: 147 meq/L (ref 137–147)

## 2013-12-23 LAB — BASIC METABOLIC PANEL WITH GFR
Anion gap: 12 (ref 5–15)
Anion gap: 15 (ref 5–15)
Anion gap: 16 — ABNORMAL HIGH (ref 5–15)
BUN: 19 mg/dL (ref 6–23)
BUN: 17 mg/dL (ref 6–23)
BUN: 13 mg/dL (ref 6–23)
CO2: 20 meq/L (ref 19–32)
CO2: 19 meq/L (ref 19–32)
CO2: 18 meq/L — ABNORMAL LOW (ref 19–32)
Calcium: 8.9 mg/dL (ref 8.4–10.5)
Calcium: 8.8 mg/dL (ref 8.4–10.5)
Calcium: 8.8 mg/dL (ref 8.4–10.5)
Chloride: 116 meq/L — ABNORMAL HIGH (ref 96–112)
Chloride: 113 meq/L — ABNORMAL HIGH (ref 96–112)
Chloride: 109 meq/L (ref 96–112)
Creatinine, Ser: 0.47 mg/dL — ABNORMAL LOW (ref 0.50–1.10)
Creatinine, Ser: 0.48 mg/dL — ABNORMAL LOW (ref 0.50–1.10)
Creatinine, Ser: 0.47 mg/dL — ABNORMAL LOW (ref 0.50–1.10)
GFR calc Af Amer: >90 mL/min
GFR calc Af Amer: >90 mL/min
GFR calc Af Amer: >90 mL/min
GFR calc non Af Amer: >90 mL/min
GFR calc non Af Amer: >90 mL/min
GFR calc non Af Amer: >90 mL/min
Glucose, Bld: 111 mg/dL — ABNORMAL HIGH (ref 70–99)
Glucose, Bld: 146 mg/dL — ABNORMAL HIGH (ref 70–99)
Glucose, Bld: 133 mg/dL — ABNORMAL HIGH (ref 70–99)
Potassium: 3.5 meq/L — ABNORMAL LOW (ref 3.7–5.3)
Potassium: 3.6 meq/L — ABNORMAL LOW (ref 3.7–5.3)
Potassium: 3.6 meq/L — ABNORMAL LOW (ref 3.7–5.3)
Sodium: 148 meq/L — ABNORMAL HIGH (ref 137–147)
Sodium: 147 meq/L (ref 137–147)
Sodium: 143 meq/L (ref 137–147)

## 2013-12-23 LAB — GLUCOSE, CAPILLARY
GLUCOSE-CAPILLARY: 200 mg/dL — AB (ref 70–99)
GLUCOSE-CAPILLARY: 177 mg/dL — AB (ref 70–99)
GLUCOSE-CAPILLARY: 145 mg/dL — AB (ref 70–99)
GLUCOSE-CAPILLARY: 106 mg/dL — AB (ref 70–99)
GLUCOSE-CAPILLARY: 183 mg/dL — AB (ref 70–99)
GLUCOSE-CAPILLARY: 158 mg/dL — AB (ref 70–99)
GLUCOSE-CAPILLARY: 141 mg/dL — AB (ref 70–99)
GLUCOSE-CAPILLARY: 161 mg/dL — AB (ref 70–99)
Glucose-Capillary: 118 mg/dL — ABNORMAL HIGH (ref 70–99)
Glucose-Capillary: 154 mg/dL — ABNORMAL HIGH (ref 70–99)
Glucose-Capillary: 161 mg/dL — ABNORMAL HIGH (ref 70–99)
Glucose-Capillary: 173 mg/dL — ABNORMAL HIGH (ref 70–99)
Glucose-Capillary: 174 mg/dL — ABNORMAL HIGH (ref 70–99)
Glucose-Capillary: 178 mg/dL — ABNORMAL HIGH (ref 70–99)
Glucose-Capillary: 183 mg/dL — ABNORMAL HIGH (ref 70–99)
Glucose-Capillary: 185 mg/dL — ABNORMAL HIGH (ref 70–99)
Glucose-Capillary: 199 mg/dL — ABNORMAL HIGH (ref 70–99)
Glucose-Capillary: 292 mg/dL — ABNORMAL HIGH (ref 70–99)
Glucose-Capillary: 91 mg/dL (ref 70–99)

## 2013-12-23 LAB — URINALYSIS, ROUTINE W REFLEX MICROSCOPIC
Glucose, UA: >1000 mg/dL — AB
HGB URINE DIPSTICK: NEGATIVE
Leukocytes, UA: NEGATIVE
Nitrite: NEGATIVE
PROTEIN: 30 mg/dL — AB
Specific Gravity, Urine: 1.025 (ref 1.005–1.030)
Urobilinogen, UA: 0.2 mg/dL (ref 0.0–1.0)
pH: 6 (ref 5.0–8.0)

## 2013-12-23 LAB — URINE MICROSCOPIC-ADD ON

## 2013-12-23 LAB — CBC
HCT: 41.4 % (ref 36.0–46.0)
Hemoglobin: 12.7 g/dL (ref 12.0–15.0)
MCH: 23 pg — ABNORMAL LOW (ref 26.0–34.0)
MCHC: 30.7 g/dL (ref 30.0–36.0)
MCV: 75.1 fL — ABNORMAL LOW (ref 78.0–100.0)
Platelets: 210 10*3/uL (ref 150–400)
RBC: 5.51 MIL/uL — AB (ref 3.87–5.11)
RDW: 17.3 % — AB (ref 11.5–15.5)
WBC: 19.5 10*3/uL — AB (ref 4.0–10.5)

## 2013-12-23 LAB — RAPID URINE DRUG SCREEN, HOSP PERFORMED
Amphetamines: NOT DETECTED
BARBITURATES: NOT DETECTED
Benzodiazepines: NOT DETECTED
Cocaine: NOT DETECTED
Opiates: POSITIVE — AB
TETRAHYDROCANNABINOL: POSITIVE — AB

## 2013-12-23 LAB — TROPONIN I: Troponin I: <0.3 ng/mL (ref <0.30)

## 2013-12-23 LAB — MRSA PCR SCREENING: MRSA BY PCR: NEGATIVE

## 2013-12-23 LAB — PREGNANCY, URINE: Preg Test, Ur: NEGATIVE

## 2013-12-23 LAB — HEMOGLOBIN A1C
Hgb A1c MFr Bld: 13.2 % — ABNORMAL HIGH (ref <5.7)
Mean Plasma Glucose: 332 mg/dL — ABNORMAL HIGH (ref <117)

## 2013-12-23 LAB — LIPASE, BLOOD
LIPASE: 18 U/L (ref 11–59)
Lipase: 26 U/L (ref 11–59)

## 2013-12-23 LAB — VALPROIC ACID LEVEL: Valproic Acid Lvl: <10 ug/mL — ABNORMAL LOW (ref 50.0–100.0)

## 2013-12-23 MED ORDER — SODIUM CHLORIDE 0.9 % IJ SOLN
10.0000 mL | Freq: Two times a day (BID) | INTRAMUSCULAR | Status: DC
  Administered 2013-12-23 (×2): 10 mL

## 2013-12-23 MED ORDER — HYOSCYAMINE SULFATE 0.125 MG SL SUBL
0.1250 mg | SUBLINGUAL_TABLET | Freq: Three times a day (TID) | SUBLINGUAL | Status: DC
  Administered 2013-12-23 – 2013-12-25 (×7): 0.125 mg via SUBLINGUAL
  Filled 2013-12-23 (×9): qty 1

## 2013-12-23 MED ORDER — INSULIN GLARGINE 100 UNIT/ML ~~LOC~~ SOLN
30.0000 [IU] | Freq: Every day | SUBCUTANEOUS | Status: DC
  Administered 2013-12-23: 30 [IU] via SUBCUTANEOUS
  Filled 2013-12-23 (×3): qty 0.3

## 2013-12-23 MED ORDER — ONDANSETRON HCL 4 MG/2ML IJ SOLN
4.0000 mg | Freq: Four times a day (QID) | INTRAMUSCULAR | Status: DC | PRN
  Administered 2013-12-23: 4 mg via INTRAVENOUS
  Filled 2013-12-23: qty 2

## 2013-12-23 MED ORDER — SODIUM CHLORIDE 0.9 % IJ SOLN
10.0000 mL | INTRAMUSCULAR | Status: DC | PRN
  Administered 2013-12-25: 10 mL

## 2013-12-23 MED ORDER — PANTOPRAZOLE SODIUM 40 MG IV SOLR
40.0000 mg | Freq: Two times a day (BID) | INTRAVENOUS | Status: DC
  Administered 2013-12-23 (×2): 40 mg via INTRAVENOUS
  Filled 2013-12-23 (×3): qty 40

## 2013-12-23 MED ORDER — GI COCKTAIL ~~LOC~~
30.0000 mL | Freq: Three times a day (TID) | ORAL | Status: DC | PRN
  Filled 2013-12-23: qty 30

## 2013-12-23 MED ORDER — BISACODYL 10 MG RE SUPP: 10.0000 mg | Freq: Every day | RECTAL | Status: DC | PRN

## 2013-12-23 MED ORDER — METOCLOPRAMIDE HCL 5 MG/ML IJ SOLN
5.0000 mg | Freq: Three times a day (TID) | INTRAMUSCULAR | Status: DC
  Filled 2013-12-23 (×3): qty 1

## 2013-12-23 MED ORDER — INSULIN ASPART 100 UNIT/ML ~~LOC~~ SOLN
0.0000 [IU] | Freq: Three times a day (TID) | SUBCUTANEOUS | Status: DC
  Administered 2013-12-24: 2 [IU] via SUBCUTANEOUS
  Administered 2013-12-24: 5 [IU] via SUBCUTANEOUS
  Administered 2013-12-24: 3 [IU] via SUBCUTANEOUS
  Administered 2013-12-25: 2 [IU] via SUBCUTANEOUS
  Administered 2013-12-25: 3 [IU] via SUBCUTANEOUS

## 2013-12-23 MED ORDER — METOCLOPRAMIDE HCL 5 MG/ML IJ SOLN
10.0000 mg | Freq: Four times a day (QID) | INTRAMUSCULAR | Status: DC
  Administered 2013-12-23 – 2013-12-24 (×4): 10 mg via INTRAVENOUS
  Filled 2013-12-23 (×8): qty 2

## 2013-12-23 NOTE — Progress Notes (Signed)
Pt with very poor venous access. EJ placed in ED for IVF. Labs unable to be obtained. Foot sticks OK'd. Pt will likely need PICC in am given DKA and need for fluids, meds and frequent labs. Baltazar Najjar, NP

## 2013-12-23 NOTE — Progress Notes (Signed)
INITIAL NUTRITION ASSESSMENT  DOCUMENTATION CODES Per approved criteria  -Severe malnutrition in the context of chronic illness   INTERVENTION: Once diet advanced, add Glucerna Shake po BID, each supplement provides 220 kcal and 10 grams of protein RD will continue to monitor for nutrition care plan.  NUTRITION DIAGNOSIS: Inadequate oral intake related to inability to eat as evidenced by NPO status.  Goal: Pt to meet >/= 90% of their estimated nutrition needs   Monitor:  Wt trends, po intake, labs  Reason for Assessment: Malnutrition Screening Tool  43 y.o. female  Admitting Dx: Diabetic ketoacidosis  ASSESSMENT: 43 y.o. female has a past medical history significant for poorly controlled DM with multiple hospitalization for DKA, seizure disorder, hypertension, presents to the emergency room with a chief complaint of severe abdominal pain that started last night progressively worse this morning when she decided to come in. She has a history of gastroparesis and has had multiple hospitalizations for this as well. Patient reports that she has had chest pain after n/v x 24 hours. Admitted with DKA.  Pt reports that her usual body weight is 127-128 lbs, but that she loses weight very fast when she has episodes of nausea and vomiting. She has evidence of fat and muscle wasting on brief exam however RD unable to complete full assessment as pt is vomiting and has asked RD to leave.  Pt meets criteria for severe MALNUTRITION in the context of chronic illness as evidenced by reported 10% wt loss in two months and reported intake <75% of estimated needs for at least 1 month.  CBG's: 106 - 174 HgbA1c from April: 13  Height: Ht Readings from Last 1 Encounters:  12/22/13 5\' 4"  (1.626 m)    Weight: Wt Readings from Last 1 Encounters:  12/22/13 115 lb 1.3 oz (52.2 kg)    Ideal Body Weight: 54.5 kg  % Ideal Body Weight: 96%  Wt Readings from Last 10 Encounters:  12/22/13 115 lb 1.3  oz (52.2 kg)  12/02/13 115 lb 15.4 oz (52.6 kg)  11/23/13 119 lb 14.9 oz (54.4 kg)  11/02/13 120 lb 13 oz (54.8 kg)  10/22/13 123 lb 10.9 oz (56.1 kg)  10/12/13 115 lb (52.164 kg)  04/29/13 130 lb 12.8 oz (59.33 kg)  04/15/13 126 lb (57.153 kg)  04/07/13 125 lb (56.7 kg)  04/07/13 125 lb (56.7 kg)    Usual Body Weight: 127-128 lbs  % Usual Body Weight: 90%  BMI:  Body mass index is 19.74 kg/(m^2). WNL  Estimated Nutritional Needs: Kcal: 1500-1700 Protein: 65-75 g Fluid: >1.6 L/day  Skin: intact  Diet Order: NPO  EDUCATION NEEDS: -Education needs not addressed   Intake/Output Summary (Last 24 hours) at 12/23/13 0904 Last data filed at 12/23/13 0600  Gross per 24 hour  Intake 4004.17 ml  Output    800 ml  Net 3204.17 ml    Last BM: 7/6  Labs:   Recent Labs Lab 12/22/13 1845 12/23/13 0004  NA 139 147  147  K 6.4* 5.0  5.1  CL 90* 109  110  CO2 9* 11*  11*  BUN 36* 30*  31*  CREATININE 0.98 0.68  0.68  CALCIUM 10.5 9.2  9.3  GLUCOSE 696* 297*  298*    CBG (last 3)   Recent Labs  12/23/13 0534 12/23/13 0645 12/23/13 0741  GLUCAP 174* 145* 106*   Lab Results  Component Value Date   HGBA1C 13.0* 10/13/2013     Scheduled Meds: . sodium  chloride   Intravenous STAT  . enoxaparin (LOVENOX) injection  40 mg Subcutaneous QHS  . pantoprazole (PROTONIX) IV  40 mg Intravenous Q24H  . sodium chloride  10-40 mL Intracatheter Q12H    Continuous Infusions: . sodium chloride    . dextrose 5 % and 0.45% NaCl 125 mL/hr at 12/23/13 0536  . insulin (NOVOLIN-R) infusion 3 Units/hr (12/23/13 4585)    Past Medical History  Diagnosis Date  . Kidney stone   . Seizure disorder     started with pregnancy of first son  . Gastritis   . Bipolar 2 disorder   . Post traumatic stress disorder     "flipping out" after people close to her died  . Heart murmur   . Arthritis     r ankle-S/P surgery (2007)  . Anemia     childhood  . Depression      childhood  . Hepatitis C     biopsy in 2010-no rx-was supposed to see a hepatologist in [place].  With cirrhosis  . Neuropathy     in legs and feet and hands  . Cataracts, bilateral   . Scoliosis   . Diabetes mellitus     diagnosed in 1996-always been on insulin  . DKA (diabetic ketoacidoses)     Recurrent admissions for DKA, medication non-complaince, poor social situation  . Diabetic neuropathy, type I diabetes mellitus     numbness bilaterally feet  . HTN (hypertension) 10/12/2013    Past Surgical History  Procedure Laterality Date  . Ankle surgery  2007  . Endometrial biopsy  06/22/2012  . Cholecystectomy  04/07/2013  . Cholecystectomy N/A 04/07/2013    Procedure: LAPAROSCOPIC CHOLECYSTECTOMY WITH INTRAOPERATIVE CHOLANGIOGRAM;  Surgeon: Adin Hector, MD;  Location: [facility];  Service: General;  Laterality: N/A;  . Cesarean section      3 times    Inda Coke MS, RD, LDN Inpatient Registered Dietitian Pager: [phone] After-hours pager: [phone]

## 2013-12-23 NOTE — Progress Notes (Signed)
[facility]  NEILANI DUFFEE BMW:[id number] DOB: [birth year] DOA: 12/22/2013 PCP: Elbert Ewings, FNP   Brief narrative: 43 y.o. female with history of diabetes mellitus presented to the ER with complaints of persistent nausea and vomiting over the past 24-48 hours. This was associated with retrosternal chest pain after nausea and vomiting started. She endorses non compliance with insulin after nausea and vomiting onset.   In the ER labs revealed elevated blood sugar with anion gap acidosis consistent with DKA. Patient had been started on IV fluids in addition to multiple fluid boluses and IV insulin infusion. Patient was admitted last month for DKA. Patient's abdomen was documented as benign on exam and EKG showed sinus tachycardia. Initial troponins were negative. Patient does have history of possible gastroparesis  HPI/Subjective: Very restless and complaining of significant "chest pain" and diffuse abdominal pain-admits last bowel movement was almost 1 week prior  Assessment/Plan: Active Problems:   DKA (diabetic ketoacidoses) -AG down to 14 but serum CO2 low at 18 so cont insulin gtt for 4 more hrs until next BMET returned -free water deficit calculated at 1.5 L so cont IVFs -resume long acting insulin   Chest Pain -seems GI in nature -EKG during CP non ischemic and enzymes thus far x 2 have been negative -GI cocktail,PPI -see below    Non compliance w medication regimen -recurrent issue and likely influenced by underlying chronic psych issues    Bipolar 1 disorder -resume home meds once N/V resolved -Risperdal and ? Depakote     Leukocytosis, unspecified -likely due to DKA and DH -follow    Nausea with vomiting/Gastroparesis due to DM/GERD -suspect due to onstipation seen on admit XR n setting of acute exac gastroparesis due to DKA -start with Dulcolax supp until can tolerate PO then begin aggressive laxatives -Zofran for sx  management -Reglan to improve GI motility -repeat AAS to ensure no change since admit such as ileus vs SBP vs free air  -cont PPI    Chronic abdominal pain/constipation -as above -Abdominal x-ray at time of admission confirms significant pancolonic obstipation and appearance of fecalization of small bowel    Microcytic anemia    HTN (hypertension) -BP controlled     Seizure disorder -valproic acid level subtherapeutic so suspect not taking as prescribed  DVT prophylaxis: Lovenox Code Status: Full Family Communication: No family at bedside Disposition Plan/Expected LOS: Step down   Consultants: None  Procedures: None  Cultures: None  Antibiotics: None  Objective: Blood pressure 127/83, pulse 86, temperature 98.2 F (36.8 C), temperature source Oral, resp. rate 10, height 5\' 4"  (1.626 m), weight 115 lb 1.3 oz (52.2 kg), SpO2 100.00%.  Intake/Output Summary (Last 24 hours) at 12/23/13 1250 Last data filed at 12/23/13 0600  Gross per 24 hour  Intake 4004.17 ml  Output    800 ml  Net 3204.17 ml     Exam: General: No acute respiratory distress but very restless and endorsing significant chest discomfort and diffuse abdominal pain Lungs: Clear to auscultation bilaterally without wheezes or crackles, RA Cardiovascular: Regular rate and rhythm without murmur gallop or rub normal S1 and S2, no peripheral edema or JVD Abdomen: Diffusely tender without guarding or rebound, nondistended, soft, hypoactive bowel sounds positive, no ascites, no appreciable mass Musculoskeletal: No significant cyanosis, clubbing of bilateral lower extremities   Scheduled Meds:  Scheduled Meds: . sodium chloride   Intravenous STAT  . enoxaparin (LOVENOX) injection  40 mg Subcutaneous QHS  .  hyoscyamine  0.125 mg Sublingual TID  . metoCLOPramide (REGLAN) injection  10 mg Intravenous 4 times per day  . pantoprazole (PROTONIX) IV  40 mg Intravenous Q12H  . sodium chloride  10-40 mL  Intracatheter Q12H   Continuous Infusions: . sodium chloride    . dextrose 5 % and 0.45% NaCl 125 mL/hr at 12/23/13 0536  . insulin (NOVOLIN-R) infusion 6.2 Units/hr (12/23/13 1213)    Data Reviewed: Basic Metabolic Panel:  Recent Labs Lab 12/22/13 1845 12/23/13 0004 12/23/13 1000  NA 139 147  147 147  K 6.4* 5.0  5.1 4.2  CL 90* 109  110 115*  CO2 9* 11*  11* 18*  GLUCOSE 696* 297*  298* 195*  BUN 36* 30*  31* 22  CREATININE 0.98 0.68  0.68 0.49*  CALCIUM 10.5 9.2  9.3 8.6   Liver Function Tests:  Recent Labs Lab 12/22/13 1845  AST 79*  ALT 61*  ALKPHOS 110  BILITOT 0.5  PROT 10.0*  ALBUMIN 4.9    Recent Labs Lab 12/23/13 0004 12/23/13 1000  LIPASE 26 18   No results found for this basename: AMMONIA,  in the last 168 hours CBC:  Recent Labs Lab 12/22/13 1845 12/23/13 0004  WBC 21.7* 19.5*  NEUTROABS 16.7*  --   HGB 14.5 12.7  HCT 46.2* 41.4  MCV 75.4* 75.1*  PLT 299 210   Cardiac Enzymes:  Recent Labs Lab 12/22/13 1845 12/23/13 0004 12/23/13 1000  TROPONINI <0.30 <0.30 <0.30   BNP (last 3 results) No results found for this basename: PROBNP,  in the last 8760 hours CBG:  Recent Labs Lab 12/23/13 0645 12/23/13 0741 12/23/13 0853 12/23/13 1005 12/23/13 1108  GLUCAP 145* 106* 161* 154* 183*    Recent Results (from the past 240 hour(s))  MRSA PCR SCREENING     Status: None   Collection Time    12/22/13 10:28 PM      Result Value Ref Range Status   MRSA by PCR NEGATIVE  NEGATIVE Final   Comment:            The GeneXpert MRSA Assay (FDA     approved for NASAL specimens     only), is one component of a     comprehensive MRSA colonization     surveillance program. It is not     intended to diagnose MRSA     infection nor to guide or     monitor treatment for     MRSA infections.     Studies:  Recent x-ray studies have been reviewed in detail by the Attending Physician  Time spent :      Erin Hearing,  [place] [facility]  [phone] Pager [phone]   **If unable to reach the above provider after paging please contact the [place] @ [phone]  On-Call/Text Page:      [email or web address]      password [email or web address]  If 7PM-7AM, please contact night-coverage [email or web address] Password [email or web address] 12/23/2013, 12:50 PM   LOS: 1 day   Attending Patient was seen, examined,treatment plan was discussed with the Physician extender. I have directly reviewed the clinical findings, lab, imaging studies and management of this patient in detail. I have made the necessary changes to the above noted documentation, and agree with the documentation, as recorded by the Physician extender.  Nena Alexander MD [facility].

## 2013-12-23 NOTE — Progress Notes (Signed)
Utilization review completed. Jamee Pacholski, RN, BSN. 

## 2013-12-23 NOTE — Progress Notes (Signed)
Peripherally Inserted Central Catheter/Midline Placement  The IV Nurse has discussed with the patient and/or persons authorized to consent for the patient, the purpose of this procedure and the potential benefits and risks involved with this procedure.  The benefits include less needle sticks, lab draws from the catheter and patient may be discharged home with the catheter.  Risks include, but not limited to, infection, bleeding, blood clot (thrombus formation), and puncture of an artery; nerve damage and irregular heat beat.  Alternatives to this procedure were also discussed.  PICC/Midline Placement Documentation  PICC / Midline Double Lumen 78/29/56 PICC Right Basilic 39 cm 0 cm (Active)  Indication for Insertion or Continuance of Line Poor Vasculature-patient has had multiple peripheral attempts or PIVs lasting less than 24 hours 12/23/2013  8:00 AM  Exposed Catheter (cm) 0 cm 12/23/2013  8:00 AM  Dressing Change Due 12/30/13 12/23/2013  8:00 AM       Jule Economy Horton 12/23/2013, 8:47 AM

## 2013-12-24 LAB — GLUCOSE, CAPILLARY
GLUCOSE-CAPILLARY: 196 mg/dL — AB (ref 70–99)
GLUCOSE-CAPILLARY: 90 mg/dL (ref 70–99)
Glucose-Capillary: 238 mg/dL — ABNORMAL HIGH (ref 70–99)
Glucose-Capillary: 257 mg/dL — ABNORMAL HIGH (ref 70–99)
Glucose-Capillary: 207 mg/dL — ABNORMAL HIGH (ref 70–99)

## 2013-12-24 LAB — BASIC METABOLIC PANEL
ANION GAP: 15 (ref 5–15)
BUN: 13 mg/dL (ref 6–23)
CHLORIDE: 108 meq/L (ref 96–112)
CO2: 19 mEq/L (ref 19–32)
Calcium: 8.6 mg/dL (ref 8.4–10.5)
Creatinine, Ser: 0.49 mg/dL — ABNORMAL LOW (ref 0.50–1.10)
GFR calc non Af Amer: >90 mL/min (ref >90)
Glucose, Bld: 222 mg/dL — ABNORMAL HIGH (ref 70–99)
POTASSIUM: 3.7 meq/L (ref 3.7–5.3)
Sodium: 142 mEq/L (ref 137–147)

## 2013-12-24 LAB — CBC
HCT: 32.5 % — ABNORMAL LOW (ref 36.0–46.0)
Hemoglobin: 10.1 g/dL — ABNORMAL LOW (ref 12.0–15.0)
MCH: 23 pg — ABNORMAL LOW (ref 26.0–34.0)
MCHC: 31.1 g/dL (ref 30.0–36.0)
MCV: 73.9 fL — AB (ref 78.0–100.0)
PLATELETS: 145 10*3/uL — AB (ref 150–400)
RBC: 4.4 MIL/uL (ref 3.87–5.11)
RDW: 16.8 % — AB (ref 11.5–15.5)
WBC: 13.2 10*3/uL — AB (ref 4.0–10.5)

## 2013-12-24 MED ORDER — INSULIN ASPART PROT & ASPART (70-30 MIX) 100 UNIT/ML ~~LOC~~ SUSP
25.0000 [IU] | Freq: Two times a day (BID) | SUBCUTANEOUS | Status: DC
  Administered 2013-12-24 – 2013-12-25 (×2): 25 [IU] via SUBCUTANEOUS
  Filled 2013-12-24: qty 10

## 2013-12-24 MED ORDER — ACETAMINOPHEN 325 MG PO TABS
650.0000 mg | ORAL_TABLET | ORAL | Status: DC | PRN
  Administered 2013-12-24: 650 mg via ORAL
  Filled 2013-12-24: qty 2

## 2013-12-24 MED ORDER — METOCLOPRAMIDE HCL 5 MG PO TABS
5.0000 mg | ORAL_TABLET | Freq: Three times a day (TID) | ORAL | Status: DC
  Administered 2013-12-24 – 2013-12-25 (×5): 5 mg via ORAL
  Filled 2013-12-24 (×8): qty 1

## 2013-12-24 MED ORDER — MAGNESIUM HYDROXIDE 400 MG/5ML PO SUSP
30.0000 mL | Freq: Once | ORAL | Status: AC
  Administered 2013-12-24: 30 mL via ORAL
  Filled 2013-12-24: qty 30

## 2013-12-24 MED ORDER — DOCUSATE SODIUM 100 MG PO CAPS
100.0000 mg | ORAL_CAPSULE | Freq: Two times a day (BID) | ORAL | Status: DC
  Administered 2013-12-24 – 2013-12-25 (×3): 100 mg via ORAL
  Filled 2013-12-24 (×3): qty 1

## 2013-12-24 MED ORDER — PANTOPRAZOLE SODIUM 40 MG PO TBEC
40.0000 mg | DELAYED_RELEASE_TABLET | Freq: Every day | ORAL | Status: DC
  Administered 2013-12-24 – 2013-12-25 (×2): 40 mg via ORAL
  Filled 2013-12-24 (×2): qty 1

## 2013-12-24 MED ORDER — POLYETHYLENE GLYCOL 3350 17 G PO PACK
17.0000 g | PACK | Freq: Every day | ORAL | Status: DC
  Administered 2013-12-24 – 2013-12-25 (×2): 17 g via ORAL
  Filled 2013-12-24 (×2): qty 1

## 2013-12-24 NOTE — Progress Notes (Signed)
[facility]  Mary [name] TML:[id number] DOB: 04/29/1971 DOA: 12/22/2013 PCP: Elbert Ewings, FNP  Brief narrative: 43 y.o. female with history of diabetes mellitus presented to the ER with complaints of persistent nausea and vomiting over 24-48 hours. This was associated with retrosternal chest pain after nausea and vomiting started. She endorsed non compliance with insulin after nausea and vomiting onset.   In the ER labs revealed elevated blood sugar with anion gap acidosis consistent with DKA.  HPI/Subjective: Still endorsing constant,sharp chest pain-says now that last BM was 4 days ago on Monday  Assessment/Plan:    DKA (diabetic ketoacidoses) -AG closed 7/9 PM- initially was started on Lantus but on 70/30 at home so will resume instead-decrease dose 70/30 from 35 to 25 since PO intake poor   Epigastric Pain -seems GI in nature -EKG during CP non ischemic and enzymes have been negative - sx not c/w chest pain as pt points to epigastrium when describing pain today  -GI cocktail was given 7/9 -cont PPI -see below    Non compliance w medication regimen -recurrent issue and likely influenced by underlying chronic psych issues    Bipolar 1 disorder -resume home meds once N/V resolved    Leukocytosis -likely due to DKA and DH -follow    Nausea with vomiting/Gastroparesis due to DM/GERD -suspect due to constipation seen on admit XR in setting of acute exac gastroparesis due to DKA -give MOM x 1 dose then begin Colace and Miralax -dc narcs -cont Zofran for sx management -cont Reglan to improve GI motility -cont PPI    Chronic abdominal pain/constipation -as above -Abdominal x-ray at time of admission confirms significant pancolonic obstipation and appearance of fecalization of small bowel - explained to pt the importance of avoiding narcotic pain meds     Microcytic anemia    HTN (hypertension) -BP controlled     Seizure  disorder -valproic acid level subtherapeutic so suspect not taking as prescribed  DVT prophylaxis: Lovenox Code Status: Full Family Communication: No family at bedside Disposition Plan/Expected LOS: Transfer to floor - d/c home when CBG controlled and pt tolerating oral intake consistently   Consultants: None  Procedures: None  Cultures: None  Antibiotics: None  Objective: Blood pressure 134/82, pulse 86, temperature 98.8 F (37.1 C), temperature source Oral, resp. rate 19, height 5\' 4"  (1.626 m), weight 115 lb 1.3 oz (52.2 kg), SpO2 99.00%.  Intake/Output Summary (Last 24 hours) at 12/24/13 1115 Last data filed at 12/24/13 0500  Gross per 24 hour  Intake   1900 ml  Output    600 ml  Net   1300 ml    Exam: General: No acute respiratory distress  Lungs: Clear to auscultation bilaterally, RA Cardiovascular: Regular rate and rhythm without murmur gallop or rub normal S1 and S2, no peripheral edema Abdomen: Minimally tender without guarding or rebound, nondistended, soft, active bowel sounds positive, no ascites, no appreciable mass Musculoskeletal: No significant cyanosis, clubbing of bilateral lower extremities  Scheduled Meds:  Scheduled Meds: . docusate sodium  100 mg Oral BID  . enoxaparin (LOVENOX) injection  40 mg Subcutaneous QHS  . hyoscyamine  0.125 mg Sublingual TID  . insulin aspart  0-9 Units Subcutaneous TID WC  . insulin aspart protamine- aspart  25 Units Subcutaneous BID WC  . metoCLOPramide  5 mg Oral TID AC & HS  . pantoprazole  40 mg Oral QAC breakfast  . polyethylene glycol  17 g Oral Daily  .  sodium chloride  10-40 mL Intracatheter Q12H   Data Reviewed: Basic Metabolic Panel:  Recent Labs Lab 12/23/13 1000 12/23/13 1400 12/23/13 1715 12/23/13 2117 12/24/13 0500  NA 147 148* 147 143 142  K 4.2 3.5* 3.6* 3.6* 3.7  CL 115* 116* 113* 109 108  CO2 18* 20 19 18* 19  GLUCOSE 195* 111* 146* 133* 222*  BUN 22 19 17 13 13   CREATININE 0.49*  0.47* 0.48* 0.47* 0.49*  CALCIUM 8.6 8.9 8.8 8.8 8.6   Liver Function Tests:  Recent Labs Lab 12/22/13 1845  AST 79*  ALT 61*  ALKPHOS 110  BILITOT 0.5  PROT 10.0*  ALBUMIN 4.9    Recent Labs Lab 12/23/13 0004 12/23/13 1000  LIPASE 26 18   CBC:  Recent Labs Lab 12/22/13 1845 12/23/13 0004 12/24/13 0500  WBC 21.7* 19.5* 13.2*  NEUTROABS 16.7*  --   --   HGB 14.5 12.7 10.1*  HCT 46.2* 41.4 32.5*  MCV 75.4* 75.1* 73.9*  PLT 299 210 145*   Cardiac Enzymes:  Recent Labs Lab 12/22/13 1845 12/23/13 0004 12/23/13 1000  TROPONINI <0.30 <0.30 <0.30   CBG:  Recent Labs Lab 12/23/13 1703 12/23/13 1811 12/23/13 1922 12/23/13 2105 12/24/13 0749  GLUCAP 141* 161* 199* 118* 238*    Recent Results (from the past 240 hour(s))  MRSA PCR SCREENING     Status: None   Collection Time    12/22/13 10:28 PM      Result Value Ref Range Status   MRSA by PCR NEGATIVE  NEGATIVE Final   Comment:            The GeneXpert MRSA Assay (FDA     approved for NASAL specimens     only), is one component of a     comprehensive MRSA colonization     surveillance program. It is not     intended to diagnose MRSA     infection nor to guide or     monitor treatment for     MRSA infections.     Studies:  Recent x-ray studies have been reviewed in detail by the Attending Physician  Time spent : [place], ANP [facility]  [phone] Pager [phone]   **If unable to reach the above provider after paging please contact the [place] @ [phone]  On-Call/Text Page:      [email or web address]      password [email or web address]  If 7PM-7AM, please contact night-coverage [email or web address] Password [email or web address] 12/24/2013, 11:15 AM   LOS: 2 days   I have personally examined this patient and reviewed the entire database. I have reviewed the above note, made any necessary editorial changes, and agree with its content.  Cherene Altes, MD [facility]

## 2013-12-25 LAB — COMPREHENSIVE METABOLIC PANEL
ALBUMIN: 2.9 g/dL — AB (ref 3.5–5.2)
ALT: 52 U/L — AB (ref 0–35)
AST: 68 U/L — AB (ref 0–37)
Alkaline Phosphatase: 70 U/L (ref 39–117)
Anion gap: 11 (ref 5–15)
BUN: 6 mg/dL (ref 6–23)
CALCIUM: 8.5 mg/dL (ref 8.4–10.5)
CO2: 25 mEq/L (ref 19–32)
CREATININE: 0.41 mg/dL — AB (ref 0.50–1.10)
Chloride: 107 mEq/L (ref 96–112)
GFR calc Af Amer: >90 mL/min (ref >90)
GFR calc non Af Amer: >90 mL/min (ref >90)
Glucose, Bld: 248 mg/dL — ABNORMAL HIGH (ref 70–99)
Potassium: 3.3 mEq/L — ABNORMAL LOW (ref 3.7–5.3)
SODIUM: 143 meq/L (ref 137–147)
TOTAL PROTEIN: 5.7 g/dL — AB (ref 6.0–8.3)
Total Bilirubin: 0.4 mg/dL (ref 0.3–1.2)

## 2013-12-25 LAB — CBC
HCT: 31.1 % — ABNORMAL LOW (ref 36.0–46.0)
Hemoglobin: 9.9 g/dL — ABNORMAL LOW (ref 12.0–15.0)
MCH: 22.8 pg — AB (ref 26.0–34.0)
MCHC: 31.8 g/dL (ref 30.0–36.0)
MCV: 71.5 fL — ABNORMAL LOW (ref 78.0–100.0)
PLATELETS: 147 10*3/uL — AB (ref 150–400)
RBC: 4.35 MIL/uL (ref 3.87–5.11)
RDW: 16 % — AB (ref 11.5–15.5)
WBC: 9 10*3/uL (ref 4.0–10.5)

## 2013-12-25 LAB — GLUCOSE, CAPILLARY
GLUCOSE-CAPILLARY: 240 mg/dL — AB (ref 70–99)
GLUCOSE-CAPILLARY: 172 mg/dL — AB (ref 70–99)

## 2013-12-25 MED ORDER — POTASSIUM CHLORIDE CRYS ER 10 MEQ PO TBCR
EXTENDED_RELEASE_TABLET | ORAL | Status: AC
  Administered 2013-12-25: 40 meq via ORAL
  Filled 2013-12-25: qty 4

## 2013-12-25 MED ORDER — POTASSIUM CHLORIDE CRYS ER 20 MEQ PO TBCR
40.0000 meq | EXTENDED_RELEASE_TABLET | Freq: Once | ORAL | Status: AC
  Administered 2013-12-25: 40 meq via ORAL

## 2013-12-25 MED ORDER — POLYETHYLENE GLYCOL 3350 17 G PO PACK: 17.0000 g | PACK | Freq: Every day | ORAL | Status: DC

## 2013-12-25 MED ORDER — PANTOPRAZOLE SODIUM 40 MG PO TBEC: 40.0000 mg | DELAYED_RELEASE_TABLET | Freq: Every day | ORAL | Status: DC

## 2013-12-25 MED ORDER — INSULIN NPH ISOPHANE & REGULAR (70-30) 100 UNIT/ML ~~LOC~~ SUSP: 35.0000 [IU] | Freq: Two times a day (BID) | SUBCUTANEOUS | Status: DC

## 2013-12-25 MED ORDER — DSS 100 MG PO CAPS: 200.0000 mg | ORAL_CAPSULE | Freq: Two times a day (BID) | ORAL | Status: DC | PRN

## 2013-12-25 NOTE — Discharge Summary (Addendum)
Mary [name], is a 43 y.o. female  DOB [birth year]  MRN [id number].  Admission date:  12/22/2013  Admitting Physician  Rise Patience, MD  Discharge Date:  12/25/2013   Primary MD  Elbert Ewings, FNP  Recommendations for primary care physician for things to follow:   Repeat CBC, BMP in 3 days, monitor glycemic control and compliance with diabetic medications closely.   Admission Diagnosis  Leukocytosis, unspecified [288.60] Abdominal pain, unspecified abdominal location [789.00] Diabetic ketoacidosis without coma associated with type 1 diabetes mellitus [250.11] Intractable vomiting with nausea, vomiting of unspecified type [536.2]   Discharge Diagnosis  Leukocytosis, unspecified [288.60] Abdominal pain, unspecified abdominal location [789.00] Diabetic ketoacidosis without coma associated with type 1 diabetes mellitus [250.11] Intractable vomiting with nausea, vomiting of unspecified type [536.2]   Medication noncompliance  Active Problems:   Seizure disorder   Microcytic anemia   Non compliance w medication regimen   Bipolar 1 disorder   DKA (diabetic ketoacidoses)   Leukocytosis, unspecified   Nausea with vomiting   Gastroparesis due to DM   Chronic abdominal pain   GERD (gastroesophageal reflux disease)   HTN (hypertension)      Past Medical History  Diagnosis Date  . Kidney stone   . Seizure disorder     started with pregnancy of first son  . Gastritis   . Bipolar 2 disorder   . Post traumatic stress disorder     "flipping out" after people close to her died  . Heart murmur   . Arthritis     r ankle-S/P surgery (2007)  . Anemia     childhood  . Depression     childhood  . Hepatitis C     biopsy in 2010-no rx-was supposed to see a hepatologist in [place].  With cirrhosis  . Neuropathy    in legs and feet and hands  . Cataracts, bilateral   . Scoliosis   . Diabetes mellitus     diagnosed in 1996-always been on insulin  . DKA (diabetic ketoacidoses)     Recurrent admissions for DKA, medication non-complaince, poor social situation  . Diabetic neuropathy, type I diabetes mellitus     numbness bilaterally feet  . HTN (hypertension) 10/12/2013    Past Surgical History  Procedure Laterality Date  . Ankle surgery  2007  . Endometrial biopsy  06/22/2012  . Cholecystectomy  04/07/2013  . Cholecystectomy N/A 04/07/2013    Procedure: LAPAROSCOPIC CHOLECYSTECTOMY WITH INTRAOPERATIVE CHOLANGIOGRAM;  Surgeon: Adin Hector, MD;  Location: [facility];  Service: General;  Laterality: N/A;  . Cesarean section      3 times       History of present illness and  Hospital Course:     Kindly see H&P for history of present illness and admission details, please review complete Labs, Consult reports and Test reports for all details in brief  HPI  from the history and physical done on the day of admission   Mary [name] is a 43 y.o. female with history  of diabetes mellitus, bipolar disorder, hepatitis C presents to the ER with complaints of persistent nausea vomiting over the past 24-48 hours. Patient also has developed retrosternal chest pain after nausea vomiting started. Patient states that she was not taking her insulin after nausea vomiting had started. In the ER labs revealed elevated blood sugar with anion gap acidosis consistent with DKA. Patient has been started on IV fluids boluses and IV insulin infusion. Patient was admitted last month for DKA. Patient's abdomen is benign on exam and EKG shows sinus tachycardia with troponins negative. Patient does have history of possible gastroparesis.     Hospital Course    DKA in a patient with type 1 diabetes mellitus with known history of diabetic gastroparesis and medication noncompliance. DKA was due to her running out of her insulin  syringes and needles and not taking insulin for the last few days, this in turn caused nausea vomiting and worsening of her diabetic gastroparesis along with some nonspecific chest pain due to persistent vomiting. Her A1c is 13 suggesting poor outpatient diabetic control. We'll request PCP to monitor glycemic control closely.  She was treated with DKA protocol with IV fluids, IV insulin, gap closed yesterday, she is now tolerating oral diet, is completely symptom free and eager to go home. Chest x-ray and abdominal x-ray unremarkable. We'll place her on PPI, have her follow with GI one time for her underlying history of diabetic gastroparesis and hepatitis C.  She has been counseled on medication compliance.     Constipation. Placed on bowel regimen. Minimize narcotic use.    Underlying bipolar disorder, essential hypertension, seizure disorder. Continue medications unchanged follow with PCP. Again counseled on compliance her valproic acid levels were low on admission     GERD and Hep C -  is on PPI outpatient GI followup recommended. Symptom free.     Low potassium. Replaced. Request PCP to repeat BMP in 3 days.       Discharge Condition: Stable   Follow UP  Follow-up Information   Follow up with [place], FNP. Schedule an appointment as soon as possible for a visit in 3 days.   Specialty:  Nurse Practitioner   Contact information:   [address] [place] [address] [phone]       Follow up with Silvano Rusk, MD. Schedule an appointment as soon as possible for a visit in 1 week. (Hepatitis C and diabetic gastroparesis)    Specialty:  Gastroenterology   Contact information:   [address] [place] [address] [phone]         Discharge Instructions  and  Discharge Medications         Discharge Instructions   Discharge instructions    Complete by:  As directed   Follow with Primary MD STEELE,ANTHONY, FNP in 3 days   Get CBC,  CMP  checked  by Primary MD next visit.    Activity: As tolerated with Full fall precautions use walker/cane & assistance as needed   Disposition Home    Diet: Heart Healthy Low carb  Accuchecks 4 times/day, Once in AM empty stomach and then before each meal. Log in all results and show them to your Prim.MD in 3 days. If any glucose reading is under 80 or above 300 call your Prim MD immidiately. Follow Low glucose instructions for glucose under 80 as instructed.    For Heart failure patients - Check your Weight same time everyday, if you gain over 2 pounds,  or you develop in leg swelling, experience more shortness of breath or chest pain, call your Primary MD immediately. Follow Cardiac Low Salt Diet and 1.8 lit/day fluid restriction.   On your next visit with her primary care physician please Get Medicines reviewed and adjusted.  Please request your Prim.MD to go over all Hospital Tests and Procedure/Radiological results at the follow up, please get all Hospital records sent to your Prim MD by signing hospital release before you go home.   If you experience worsening of your admission symptoms, develop shortness of breath, life threatening emergency, suicidal or homicidal thoughts you must seek medical attention immediately by calling 911 or calling your MD immediately  if symptoms less severe.  You Must read complete instructions/literature along with all the possible adverse reactions/side effects for all the Medicines you take and that have been prescribed to you. Take any new Medicines after you have completely understood and accpet all the possible adverse reactions/side effects.   Do not drive, operating heavy machinery, perform activities at heights, swimming or participation in water activities or provide baby sitting services if your were admitted for syncope or siezures until you have seen by Primary MD or a Neurologist and advised to do so again.  Do not drive when taking  Pain medications.    Do not take more than prescribed Pain, Sleep and Anxiety Medications  Special Instructions: If you have smoked or chewed Tobacco  in the last 2 yrs please stop smoking, stop any regular Alcohol  and or any Recreational drug use.  Wear Seat belts while driving.   Please note  You were cared for by a hospitalist during your hospital stay. If you have any questions about your discharge medications or the care you received while you were in the hospital after you are discharged, you can call the unit and asked to speak with the hospitalist on call if the hospitalist that took care of you is not available. Once you are discharged, your primary care physician will handle any further medical issues. Please note that NO REFILLS for any discharge medications will be authorized once you are discharged, as it is imperative that you return to your primary care physician (or establish a relationship with a primary care physician if you do not have one) for your aftercare needs so that they can reassess your need for medications and monitor your lab values.  Follow with Primary MD STEELE,ANTHONY, FNP in 3 days   Get CBC, CMP  checked  by Primary MD next visit.    Activity: As tolerated with Full fall precautions use walker/cane & assistance as needed   Disposition Home    Diet: Heart Healthy Low carb  Accuchecks 4 times/day, Once in AM empty stomach and then before each meal. Log in all results and show them to your Prim.MD in 3 days. If any glucose reading is under 80 or above 300 call your Prim MD immidiately. Follow Low glucose instructions for glucose under 80 as instructed.    For Heart failure patients - Check your Weight same time everyday, if you gain over 2 pounds, or you develop in leg swelling, experience more shortness of breath or chest pain, call your Primary MD immediately. Follow Cardiac Low Salt Diet and 1.8 lit/day fluid restriction.   On your next visit  with her primary care physician please Get Medicines reviewed and adjusted.  Please request your Prim.MD to go over all Hospital Tests and Procedure/Radiological results at the follow up,  please get all Hospital records sent to your Prim MD by signing hospital release before you go home.   If you experience worsening of your admission symptoms, develop shortness of breath, life threatening emergency, suicidal or homicidal thoughts you must seek medical attention immediately by calling 911 or calling your MD immediately  if symptoms less severe.  You Must read complete instructions/literature along with all the possible adverse reactions/side effects for all the Medicines you take and that have been prescribed to you. Take any new Medicines after you have completely understood and accpet all the possible adverse reactions/side effects.   Do not drive, operating heavy machinery, perform activities at heights, swimming or participation in water activities or provide baby sitting services if your were admitted for syncope or siezures until you have seen by Primary MD or a Neurologist and advised to do so again.  Do not drive when taking Pain medications.    Do not take more than prescribed Pain, Sleep and Anxiety Medications  Special Instructions: If you have smoked or chewed Tobacco  in the last 2 yrs please stop smoking, stop any regular Alcohol  and or any Recreational drug use.  Wear Seat belts while driving.   Please note  You were cared for by a hospitalist during your hospital stay. If you have any questions about your discharge medications or the care you received while you were in the hospital after you are discharged, you can call the unit and asked to speak with the hospitalist on call if the hospitalist that took care of you is not available. Once you are discharged, your primary care physician will handle any further medical issues. Please note that NO REFILLS for any discharge  medications will be authorized once you are discharged, as it is imperative that you return to your primary care physician (or establish a relationship with a primary care physician if you do not have one) for your aftercare needs so that they can reassess your need for medications and monitor your lab values.     Increase activity slowly    Complete by:  As directed             Medication List         aspirin EC 81 MG tablet  Take 81 mg by mouth daily.     divalproex 250 MG DR tablet  Commonly known as:  DEPAKOTE  Take 1 tablet (250 mg total) by mouth every 12 (twelve) hours.     DSS 100 MG Caps  Take 200 mg by mouth 2 (two) times daily as needed for mild constipation.     gabapentin 400 MG capsule  Commonly known as:  NEURONTIN  Take 2 capsules (800 mg total) by mouth 3 (three) times daily.     hyoscyamine 0.125 MG SL tablet  Commonly known as:  LEVSIN SL  Place 1 tablet (0.125 mg total) under the tongue 3 (three) times daily.     insulin NPH-regular Human (70-30) 100 UNIT/ML injection  Commonly known as:  NOVOLIN 70/30  Inject 35 Units into the skin 2 (two) times daily with a meal. Insulin syringes and needles as needed.     metFORMIN 1000 MG tablet  Commonly known as:  GLUCOPHAGE  Take 1,000 mg by mouth 2 (two) times daily.     metoCLOPramide 10 MG tablet  Commonly known as:  REGLAN  Take 1 tablet (10 mg total) by mouth 4 (four) times daily -  before meals and at  bedtime.     pantoprazole 40 MG tablet  Commonly known as:  PROTONIX  Take 1 tablet (40 mg total) by mouth daily. Switch for any other PPI at similar dose and frequency     polyethylene glycol packet  Commonly known as:  MIRALAX / GLYCOLAX  Take 17 g by mouth daily.     Potassium Chloride ER 20 MEQ Tbcr  Take 20 mEq by mouth daily.     risperidone 4 MG tablet  Commonly known as:  RISPERDAL  Take 0.5 tablets (2 mg total) by mouth at bedtime.          Diet and Activity recommendation: See  Discharge Instructions above   Consults obtained -    Major procedures and Radiology Reports - PLEASE review detailed and final reports for all details, in brief -      Dg Chest Port 1 View  12/23/2013   CLINICAL DATA:  Central line placement.  EXAM: PORTABLE CHEST - 1 VIEW  COMPARISON:  12/22/2013.  FINDINGS: 0912 hrs. Lungs are clear bilaterally. The cardiopericardial silhouette is within normal limits for size. Right PICC line is new in the interval with the tip positioned in the mid right atrium. This catheter could be pulled back about 25 mm for tip positioning in the region of the SVC/RA junction. Telemetry leads overlie the chest.  IMPRESSION: Right PICC line tip projects at the mid right atrium.   Electronically Signed   By: Misty Stanley M.D.   On: 12/23/2013 09:23      Dg Abd Portable 1v  12/22/2013   CLINICAL DATA:  Chest pain.  nausea and vomiting.  Diarrhea.  EXAM: PORTABLE ABDOMEN - 1 VIEW  COMPARISON:  None.  FINDINGS: Patient is rotated to the left. No evidence of dilated bowel loops. Stool noted in the rectosigmoid colon.  IMPRESSION: Unremarkable bowel gas pattern.  No acute findings.   Electronically Signed   By: Earle Gell M.D.   On: 12/22/2013 21:10    Micro Results      Recent Results (from the past 240 hour(s))  MRSA PCR SCREENING     Status: None   Collection Time    12/22/13 10:28 PM      Result Value Ref Range Status   MRSA by PCR NEGATIVE  NEGATIVE Final   Comment:            The GeneXpert MRSA Assay (FDA     approved for NASAL specimens     only), is one component of a     comprehensive MRSA colonization     surveillance program. It is not     intended to diagnose MRSA     infection nor to guide or     monitor treatment for     MRSA infections.       Today   Subjective:   Mary [name] today has no headache,no chest abdominal pain,no new weakness tingling or numbness, feels much better wants to go home today.  Objective:   Blood pressure  143/85, pulse 78, temperature 98.5 F (36.9 C), temperature source Oral, resp. rate 16, height 5\' 4"  (1.626 m), weight 56.3 kg (124 lb 1.9 oz), SpO2 100.00%.   Intake/Output Summary (Last 24 hours) at 12/25/13 1111 Last data filed at 12/25/13 0842  Gross per 24 hour  Intake 2481.67 ml  Output   3200 ml  Net -718.33 ml    Exam Awake Alert, Oriented x 3, No new F.N deficits, Normal affect [place].AT,PERRAL  Supple Neck,No JVD, No cervical lymphadenopathy appriciated.  Symmetrical Chest wall movement, Good air movement bilaterally, CTAB RRR,No Gallops,Rubs or new Murmurs, No Parasternal Heave +ve B.Sounds, Abd Soft, Non tender, No organomegaly appriciated, No rebound -guarding or rigidity. No Cyanosis, Clubbing or edema, No new Rash or bruise  Data Review   CBC w Diff: Lab Results  Component Value Date   WBC 9.0 12/25/2013   HGB 9.9* 12/25/2013   HCT 31.1* 12/25/2013   PLT 147* 12/25/2013   LYMPHOPCT 18 12/22/2013   MONOPCT 5 12/22/2013   EOSPCT 0 12/22/2013   BASOPCT 0 12/22/2013    CMP: Lab Results  Component Value Date   NA 143 12/25/2013   K 3.3* 12/25/2013   CL 107 12/25/2013   CO2 25 12/25/2013   BUN 6 12/25/2013   CREATININE 0.41* 12/25/2013   PROT 5.7* 12/25/2013   ALBUMIN 2.9* 12/25/2013   BILITOT 0.4 12/25/2013   ALKPHOS 70 12/25/2013   AST 68* 12/25/2013   ALT 52* 12/25/2013  . Lab Results  Component Value Date   HGBA1C 13.2* 12/23/2013      Total Time in preparing paper work, data evaluation and todays exam - 35 minutes  Thurnell Lose M.D on 12/25/2013 at 11:11 AM  [facility] Office  [phone]   **Disclaimer: This note may have been dictated with voice recognition software. Similar sounding words can inadvertently be transcribed and this note may contain transcription errors which may not have been corrected upon publication of note.**

## 2013-12-25 NOTE — Progress Notes (Signed)
PICC Line removed at 1200. Patient up in room getting ready for discharge.

## 2013-12-25 NOTE — Discharge Instructions (Signed)
Follow with Primary MD STEELE,ANTHONY, FNP in 3 days   Get CBC, CMP  checked  by Primary MD next visit.    Activity: As tolerated with Full fall precautions use walker/cane & assistance as needed   Disposition Home    Diet: Heart Healthy Low carb  Accuchecks 4 times/day, Once in AM empty stomach and then before each meal. Log in all results and show them to your Prim.MD in 3 days. If any glucose reading is under 80 or above 300 call your Prim MD immidiately. Follow Low glucose instructions for glucose under 80 as instructed.    For Heart failure patients - Check your Weight same time everyday, if you gain over 2 pounds, or you develop in leg swelling, experience more shortness of breath or chest pain, call your Primary MD immediately. Follow Cardiac Low Salt Diet and 1.8 lit/day fluid restriction.   On your next visit with her primary care physician please Get Medicines reviewed and adjusted.  Please request your Prim.MD to go over all Hospital Tests and Procedure/Radiological results at the follow up, please get all Hospital records sent to your Prim MD by signing hospital release before you go home.   If you experience worsening of your admission symptoms, develop shortness of breath, life threatening emergency, suicidal or homicidal thoughts you must seek medical attention immediately by calling 911 or calling your MD immediately  if symptoms less severe.  You Must read complete instructions/literature along with all the possible adverse reactions/side effects for all the Medicines you take and that have been prescribed to you. Take any new Medicines after you have completely understood and accpet all the possible adverse reactions/side effects.   Do not drive, operating heavy machinery, perform activities at heights, swimming or participation in water activities or provide baby sitting services if your were admitted for syncope or siezures until you have seen by Primary MD or a  Neurologist and advised to do so again.  Do not drive when taking Pain medications.    Do not take more than prescribed Pain, Sleep and Anxiety Medications  Special Instructions: If you have smoked or chewed Tobacco  in the last 2 yrs please stop smoking, stop any regular Alcohol  and or any Recreational drug use.  Wear Seat belts while driving.   Please note  You were cared for by a hospitalist during your hospital stay. If you have any questions about your discharge medications or the care you received while you were in the hospital after you are discharged, you can call the unit and asked to speak with the hospitalist on call if the hospitalist that took care of you is not available. Once you are discharged, your primary care physician will handle any further medical issues. Please note that NO REFILLS for any discharge medications will be authorized once you are discharged, as it is imperative that you return to your primary care physician (or establish a relationship with a primary care physician if you do not have one) for your aftercare needs so that they can reassess your need for medications and monitor your lab values.

## 2014-01-21 ENCOUNTER — Inpatient Hospital Stay (HOSPITAL_COMMUNITY)
Admission: EM | Admit: 2014-01-21 | Discharge: 2014-01-22 | DRG: 639 | Disposition: A | Discharge status: Home / Self Care | Payer: Medicaid Other | Attending: Family Medicine | Admitting: Family Medicine

## 2014-01-21 ENCOUNTER — Encounter (HOSPITAL_COMMUNITY): Payer: Self-pay | Admitting: Emergency Medicine

## 2014-01-21 DIAGNOSIS — K219 Gastro-esophageal reflux disease without esophagitis: Secondary | ICD-10-CM | POA: Diagnosis present

## 2014-01-21 DIAGNOSIS — Z9114 Patient's other noncompliance with medication regimen: Secondary | ICD-10-CM

## 2014-01-21 DIAGNOSIS — E1010 Type 1 diabetes mellitus with ketoacidosis without coma: Secondary | ICD-10-CM

## 2014-01-21 DIAGNOSIS — I10 Essential (primary) hypertension: Secondary | ICD-10-CM | POA: Diagnosis present

## 2014-01-21 DIAGNOSIS — Z794 Long term (current) use of insulin: Secondary | ICD-10-CM

## 2014-01-21 DIAGNOSIS — IMO0002 Reserved for concepts with insufficient information to code with codable children: Secondary | ICD-10-CM | POA: Diagnosis present

## 2014-01-21 DIAGNOSIS — E1065 Type 1 diabetes mellitus with hyperglycemia: Secondary | ICD-10-CM | POA: Diagnosis present

## 2014-01-21 DIAGNOSIS — E1049 Type 1 diabetes mellitus with other diabetic neurological complication: Secondary | ICD-10-CM | POA: Diagnosis present

## 2014-01-21 DIAGNOSIS — G40909 Epilepsy, unspecified, not intractable, without status epilepticus: Secondary | ICD-10-CM | POA: Diagnosis present

## 2014-01-21 DIAGNOSIS — Z91199 Patient's noncompliance with other medical treatment and regimen due to unspecified reason: Secondary | ICD-10-CM

## 2014-01-21 DIAGNOSIS — Z9119 Patient's noncompliance with other medical treatment and regimen: Secondary | ICD-10-CM

## 2014-01-21 DIAGNOSIS — B1920 Unspecified viral hepatitis C without hepatic coma: Secondary | ICD-10-CM | POA: Diagnosis present

## 2014-01-21 DIAGNOSIS — Z7982 Long term (current) use of aspirin: Secondary | ICD-10-CM

## 2014-01-21 DIAGNOSIS — E876 Hypokalemia: Secondary | ICD-10-CM | POA: Diagnosis present

## 2014-01-21 DIAGNOSIS — F4310 Post-traumatic stress disorder, unspecified: Secondary | ICD-10-CM | POA: Diagnosis present

## 2014-01-21 DIAGNOSIS — Z87442 Personal history of urinary calculi: Secondary | ICD-10-CM

## 2014-01-21 DIAGNOSIS — F17200 Nicotine dependence, unspecified, uncomplicated: Secondary | ICD-10-CM | POA: Diagnosis present

## 2014-01-21 DIAGNOSIS — Z833 Family history of diabetes mellitus: Secondary | ICD-10-CM

## 2014-01-21 DIAGNOSIS — E1142 Type 2 diabetes mellitus with diabetic polyneuropathy: Secondary | ICD-10-CM | POA: Diagnosis present

## 2014-01-21 DIAGNOSIS — M4120 Other idiopathic scoliosis, site unspecified: Secondary | ICD-10-CM | POA: Diagnosis present

## 2014-01-21 DIAGNOSIS — F319 Bipolar disorder, unspecified: Secondary | ICD-10-CM | POA: Diagnosis present

## 2014-01-21 DIAGNOSIS — M129 Arthropathy, unspecified: Secondary | ICD-10-CM | POA: Diagnosis present

## 2014-01-21 DIAGNOSIS — E1110 Type 2 diabetes mellitus with ketoacidosis without coma: Secondary | ICD-10-CM | POA: Diagnosis present

## 2014-01-21 LAB — GLUCOSE, CAPILLARY
Glucose-Capillary: 219 mg/dL — ABNORMAL HIGH (ref 70–99)
Glucose-Capillary: 195 mg/dL — ABNORMAL HIGH (ref 70–99)
Glucose-Capillary: 144 mg/dL — ABNORMAL HIGH (ref 70–99)
Glucose-Capillary: 121 mg/dL — ABNORMAL HIGH (ref 70–99)
Glucose-Capillary: 105 mg/dL — ABNORMAL HIGH (ref 70–99)
Glucose-Capillary: 134 mg/dL — ABNORMAL HIGH (ref 70–99)

## 2014-01-21 LAB — HCG, SERUM, QUALITATIVE: Preg, Serum: NEGATIVE

## 2014-01-21 LAB — BASIC METABOLIC PANEL
ANION GAP: 11 (ref 5–15)
Anion gap: 19 — ABNORMAL HIGH (ref 5–15)
Anion gap: 14 (ref 5–15)
BUN: 11 mg/dL (ref 6–23)
BUN: 12 mg/dL (ref 6–23)
BUN: 12 mg/dL (ref 6–23)
CALCIUM: 9.1 mg/dL (ref 8.4–10.5)
CHLORIDE: 103 meq/L (ref 96–112)
CO2: 16 mEq/L — ABNORMAL LOW (ref 19–32)
CO2: 22 meq/L (ref 19–32)
CO2: 21 meq/L (ref 19–32)
Calcium: 9 mg/dL (ref 8.4–10.5)
Calcium: 8.8 mg/dL (ref 8.4–10.5)
Chloride: 105 mEq/L (ref 96–112)
Chloride: 108 mEq/L (ref 96–112)
Creatinine, Ser: 0.38 mg/dL — ABNORMAL LOW (ref 0.50–1.10)
Creatinine, Ser: 0.39 mg/dL — ABNORMAL LOW (ref 0.50–1.10)
Creatinine, Ser: 0.44 mg/dL — ABNORMAL LOW (ref 0.50–1.10)
GFR calc Af Amer: >90 mL/min (ref >90)
GFR calc Af Amer: >90 mL/min (ref >90)
GFR calc non Af Amer: >90 mL/min (ref >90)
GFR calc non Af Amer: >90 mL/min (ref >90)
GLUCOSE: 119 mg/dL — AB (ref 70–99)
Glucose, Bld: 344 mg/dL — ABNORMAL HIGH (ref 70–99)
Glucose, Bld: 155 mg/dL — ABNORMAL HIGH (ref 70–99)
POTASSIUM: 3.8 meq/L (ref 3.7–5.3)
Potassium: 4.2 mEq/L (ref 3.7–5.3)
Potassium: 3.5 mEq/L — ABNORMAL LOW (ref 3.7–5.3)
SODIUM: 138 meq/L (ref 137–147)
SODIUM: 141 meq/L (ref 137–147)
SODIUM: 140 meq/L (ref 137–147)

## 2014-01-21 LAB — CBG MONITORING, ED
GLUCOSE-CAPILLARY: 452 mg/dL — AB (ref 70–99)
GLUCOSE-CAPILLARY: 280 mg/dL — AB (ref 70–99)
GLUCOSE-CAPILLARY: 232 mg/dL — AB (ref 70–99)
Glucose-Capillary: 297 mg/dL — ABNORMAL HIGH (ref 70–99)

## 2014-01-21 LAB — PHOSPHORUS: Phosphorus: 2.9 mg/dL (ref 2.3–4.6)

## 2014-01-21 LAB — URINALYSIS, ROUTINE W REFLEX MICROSCOPIC
Bilirubin Urine: NEGATIVE
Glucose, UA: >1000 mg/dL — AB
Hgb urine dipstick: NEGATIVE
KETONES UR: 40 mg/dL — AB
LEUKOCYTES UA: NEGATIVE
NITRITE: NEGATIVE
PROTEIN: NEGATIVE mg/dL
Specific Gravity, Urine: 1.03 (ref 1.005–1.030)
Urobilinogen, UA: 0.2 mg/dL (ref 0.0–1.0)
pH: 7 (ref 5.0–8.0)

## 2014-01-21 LAB — COMPREHENSIVE METABOLIC PANEL
ALBUMIN: 4.1 g/dL (ref 3.5–5.2)
ALT: 48 U/L — ABNORMAL HIGH (ref 0–35)
ANION GAP: 19 — AB (ref 5–15)
AST: 62 U/L — AB (ref 0–37)
Alkaline Phosphatase: 103 U/L (ref 39–117)
BUN: 13 mg/dL (ref 6–23)
CO2: 20 mEq/L (ref 19–32)
CREATININE: 0.49 mg/dL — AB (ref 0.50–1.10)
Calcium: 10.2 mg/dL (ref 8.4–10.5)
Chloride: 102 mEq/L (ref 96–112)
GFR calc Af Amer: >90 mL/min (ref >90)
GFR calc non Af Amer: >90 mL/min (ref >90)
Glucose, Bld: 421 mg/dL — ABNORMAL HIGH (ref 70–99)
Potassium: 4.3 mEq/L (ref 3.7–5.3)
Sodium: 141 mEq/L (ref 137–147)
TOTAL PROTEIN: 8.2 g/dL (ref 6.0–8.3)
Total Bilirubin: 0.4 mg/dL (ref 0.3–1.2)

## 2014-01-21 LAB — CBC WITH DIFFERENTIAL/PLATELET
BASOS ABS: 0 10*3/uL (ref 0.0–0.1)
Basophils Relative: 0 % (ref 0–1)
EOS ABS: 0 10*3/uL (ref 0.0–0.7)
Eosinophils Relative: 0 % (ref 0–5)
HCT: 39.5 % (ref 36.0–46.0)
HEMOGLOBIN: 12.4 g/dL (ref 12.0–15.0)
Lymphocytes Relative: 16 % (ref 12–46)
Lymphs Abs: 1.8 10*3/uL (ref 0.7–4.0)
MCH: 22.9 pg — AB (ref 26.0–34.0)
MCHC: 31.4 g/dL (ref 30.0–36.0)
MCV: 73 fL — ABNORMAL LOW (ref 78.0–100.0)
MONOS PCT: 1 % — AB (ref 3–12)
Monocytes Absolute: 0.1 10*3/uL (ref 0.1–1.0)
NEUTROS PCT: 83 % — AB (ref 43–77)
Neutro Abs: 9.2 10*3/uL — ABNORMAL HIGH (ref 1.7–7.7)
Platelets: 152 10*3/uL (ref 150–400)
RBC: 5.41 MIL/uL — ABNORMAL HIGH (ref 3.87–5.11)
RDW: 15.4 % (ref 11.5–15.5)
WBC: 11.1 10*3/uL — ABNORMAL HIGH (ref 4.0–10.5)

## 2014-01-21 LAB — TROPONIN I

## 2014-01-21 LAB — URINE MICROSCOPIC-ADD ON

## 2014-01-21 LAB — LIPASE, BLOOD: LIPASE: 29 U/L (ref 11–59)

## 2014-01-21 LAB — MRSA PCR SCREENING: MRSA by PCR: NEGATIVE

## 2014-01-21 LAB — MAGNESIUM: MAGNESIUM: 1.7 mg/dL (ref 1.5–2.5)

## 2014-01-21 LAB — I-STAT CG4 LACTIC ACID, ED: Lactic Acid, Venous: 2.1 mmol/L (ref 0.5–2.2)

## 2014-01-21 MED ORDER — RISPERIDONE 2 MG PO TABS: 2.0000 mg | ORAL_TABLET | Freq: Every day | ORAL | Status: DC

## 2014-01-21 MED ORDER — PANTOPRAZOLE SODIUM 40 MG IV SOLR
40.0000 mg | Freq: Once | INTRAVENOUS | Status: AC
  Administered 2014-01-21: 40 mg via INTRAVENOUS
  Filled 2014-01-21: qty 40

## 2014-01-21 MED ORDER — PANTOPRAZOLE SODIUM 40 MG PO TBEC
40.0000 mg | DELAYED_RELEASE_TABLET | Freq: Every day | ORAL | Status: DC
  Administered 2014-01-21 – 2014-01-22 (×2): 40 mg via ORAL
  Filled 2014-01-21 (×2): qty 1

## 2014-01-21 MED ORDER — DOCUSATE SODIUM 100 MG PO CAPS: 200.0000 mg | ORAL_CAPSULE | Freq: Two times a day (BID) | ORAL | Status: DC | PRN

## 2014-01-21 MED ORDER — SODIUM CHLORIDE 0.9 % IV SOLN
INTRAVENOUS | Status: DC
  Filled 2014-01-21: qty 1

## 2014-01-21 MED ORDER — DEXTROSE-NACL 5-0.45 % IV SOLN: INTRAVENOUS | Status: DC

## 2014-01-21 MED ORDER — PROMETHAZINE HCL 25 MG/ML IJ SOLN
25.0000 mg | Freq: Once | INTRAMUSCULAR | Status: AC
  Administered 2014-01-21: 25 mg via INTRAMUSCULAR
  Filled 2014-01-21: qty 1

## 2014-01-21 MED ORDER — ACETAMINOPHEN 325 MG PO TABS
650.0000 mg | ORAL_TABLET | Freq: Four times a day (QID) | ORAL | Status: DC | PRN
  Filled 2014-01-21: qty 2

## 2014-01-21 MED ORDER — SODIUM CHLORIDE 0.9 % IV SOLN
1000.0000 mL | Freq: Once | INTRAVENOUS | Status: AC
Start: 2014-01-21 — End: 2014-01-21
  Administered 2014-01-21: 1000 mL via INTRAVENOUS

## 2014-01-21 MED ORDER — ACETAMINOPHEN 650 MG RE SUPP: 650.0000 mg | Freq: Four times a day (QID) | RECTAL | Status: DC | PRN

## 2014-01-21 MED ORDER — DEXTROSE-NACL 5-0.45 % IV SOLN
INTRAVENOUS | Status: DC
Start: 2014-01-21 — End: 2014-01-22
  Administered 2014-01-21 – 2014-01-22 (×2): via INTRAVENOUS

## 2014-01-21 MED ORDER — HYOSCYAMINE SULFATE 0.125 MG SL SUBL
0.1250 mg | SUBLINGUAL_TABLET | Freq: Three times a day (TID) | SUBLINGUAL | Status: DC
  Administered 2014-01-21 – 2014-01-22 (×2): 0.125 mg via SUBLINGUAL
  Filled 2014-01-21 (×5): qty 1

## 2014-01-21 MED ORDER — ASPIRIN EC 81 MG PO TBEC
81.0000 mg | DELAYED_RELEASE_TABLET | Freq: Every day | ORAL | Status: DC
  Administered 2014-01-21 – 2014-01-22 (×2): 81 mg via ORAL
  Filled 2014-01-21 (×2): qty 1

## 2014-01-21 MED ORDER — SODIUM CHLORIDE 0.9 % IV SOLN: Freq: Once | INTRAVENOUS | Status: DC

## 2014-01-21 MED ORDER — MORPHINE SULFATE 4 MG/ML IJ SOLN
4.0000 mg | INTRAMUSCULAR | Status: DC | PRN
  Administered 2014-01-21: 4 mg via INTRAVENOUS
  Filled 2014-01-21 (×2): qty 1

## 2014-01-21 MED ORDER — METOCLOPRAMIDE HCL 10 MG PO TABS
10.0000 mg | ORAL_TABLET | Freq: Three times a day (TID) | ORAL | Status: DC
  Administered 2014-01-21 – 2014-01-22 (×2): 10 mg via ORAL
  Filled 2014-01-21 (×2): qty 1

## 2014-01-21 MED ORDER — GABAPENTIN 300 MG PO CAPS
600.0000 mg | ORAL_CAPSULE | Freq: Three times a day (TID) | ORAL | Status: DC
  Administered 2014-01-21 – 2014-01-22 (×2): 600 mg via ORAL
  Filled 2014-01-21 (×2): qty 2

## 2014-01-21 MED ORDER — SODIUM CHLORIDE 0.9 % IV SOLN: 1000.0000 mL | INTRAVENOUS | Status: DC

## 2014-01-21 MED ORDER — ONDANSETRON HCL 4 MG/2ML IJ SOLN: 4.0000 mg | Freq: Four times a day (QID) | INTRAMUSCULAR | Status: DC | PRN

## 2014-01-21 MED ORDER — DEXTROSE 50 % IV SOLN: 25.0000 mL | INTRAVENOUS | Status: DC | PRN

## 2014-01-21 MED ORDER — ONDANSETRON HCL 4 MG/2ML IJ SOLN
4.0000 mg | Freq: Once | INTRAMUSCULAR | Status: DC
  Filled 2014-01-21: qty 2

## 2014-01-21 MED ORDER — HEPARIN SODIUM (PORCINE) 5000 UNIT/ML IJ SOLN
5000.0000 [IU] | Freq: Three times a day (TID) | INTRAMUSCULAR | Status: DC
  Administered 2014-01-21 – 2014-01-22 (×3): 5000 [IU] via SUBCUTANEOUS
  Filled 2014-01-21 (×3): qty 1

## 2014-01-21 MED ORDER — ONDANSETRON HCL 4 MG PO TABS: 4.0000 mg | ORAL_TABLET | Freq: Four times a day (QID) | ORAL | Status: DC | PRN

## 2014-01-21 MED ORDER — INSULIN REGULAR BOLUS VIA INFUSION
0.0000 [IU] | Freq: Three times a day (TID) | INTRAVENOUS | Status: DC
  Filled 2014-01-21: qty 10

## 2014-01-21 MED ORDER — MORPHINE SULFATE 10 MG/ML IJ SOLN
10.0000 mg | Freq: Once | INTRAMUSCULAR | Status: AC
  Administered 2014-01-21: 10 mg via INTRAMUSCULAR
  Filled 2014-01-21: qty 1

## 2014-01-21 MED ORDER — SODIUM CHLORIDE 0.9 % IV SOLN
INTRAVENOUS | Status: DC
  Administered 2014-01-21: 2.4 [IU]/h via INTRAVENOUS
  Filled 2014-01-21: qty 1

## 2014-01-21 MED ORDER — DIVALPROEX SODIUM 250 MG PO DR TAB
250.0000 mg | DELAYED_RELEASE_TABLET | Freq: Two times a day (BID) | ORAL | Status: DC
  Administered 2014-01-21 – 2014-01-22 (×2): 250 mg via ORAL
  Filled 2014-01-21 (×4): qty 1

## 2014-01-21 MED ORDER — RISPERIDONE 2 MG PO TABS
2.0000 mg | ORAL_TABLET | Freq: Every day | ORAL | Status: DC
  Administered 2014-01-21: 2 mg via ORAL
  Filled 2014-01-21 (×2): qty 1

## 2014-01-21 MED ORDER — SODIUM CHLORIDE 0.9 % IV SOLN: INTRAVENOUS | Status: DC

## 2014-01-21 MED ORDER — SODIUM CHLORIDE 0.9 % IV BOLUS (SEPSIS): 1000.0000 mL | Freq: Once | INTRAVENOUS | Status: DC

## 2014-01-21 NOTE — Progress Notes (Signed)
Pt assisted to and from restroom with help of her rn. Pt laying in bed screaming and yelling. Throwing arms around. Will not answer remainder of questions on nursing admission hx. Pt screaming for nausea and pain medication. Lucius Conn BSN, RN-BC Admissions RN  01/21/2014 3:02 PM

## 2014-01-21 NOTE — ED Notes (Signed)
Pt made aware of need for UA states she can not void at this time

## 2014-01-21 NOTE — ED Notes (Signed)
Pt refuses for vital signs to be updated at this time until she has more pain meds

## 2014-01-21 NOTE — ED Notes (Signed)
Pt aware of the need for a urine sample. 

## 2014-01-21 NOTE — H&P (Signed)
[facility] History and Physical  Mary [name] YIR:[id number] DOB: 09/03/1970 DOA: 01/21/2014  Referring physician: Dr. Tanna Furry PCP: Elbert Ewings, [place]   Chief Complaint: Nausea and vomiting  HPI: Mary [name] is a 43 y.o. female  History bipolar disorder, DM type I, seizure disorder, and history of noncompliance with medical regimen. Who presents to the ED with nausea and vomiting. Reportedly patient has had multiple medical admissions secondary to noncompliance with her diabetic maintenance regimen.  Patient reports that she ran out of her medicine today and since has developed nausea and vomiting decided to show up to the ED for further evaluation.  While in the ED patient was found to have a low bicarbonate level and elevated glucose level of 421. Despite being on glucose stabilizer patient condition did not improve much as a result she was referred to [place] for further medical evaluation and recommendations.   Review of Systems:  Constitutional:  No weight loss, night sweats, Fevers, chills, fatigue.  HEENT:  No headaches, Difficulty swallowing,Tooth/dental problems,Sore throat,  No sneezing, itching, ear ache, nasal congestion, post nasal drip,  Cardio-vascular:  No chest pain, Orthopnea, PND, swelling in lower extremities, anasarca, dizziness, palpitations  GI:  No heartburn, indigestion, + abdominal pain (patient reports related to recent nausea and emesis), + nausea,  + vomiting, diarrhea, change in bowel habits, loss of appetite  Resp:  No shortness of breath with exertion or at rest. No excess mucus, no productive cough, No non-productive cough, No coughing up of blood.No change in color of mucus.No wheezing.No chest wall deformity  Skin:  no rash or lesions.  GU:  no dysuria, change in color of urine, no urgency or frequency. No flank pain.  Musculoskeletal:  No joint pain or swelling. No decreased range of motion. No back pain.  Psych:  No change in mood or  affect. No depression or anxiety. No memory loss.   Past Medical History  Diagnosis Date  . Kidney stone   . Seizure disorder     started with pregnancy of first son  . Gastritis   . Bipolar 2 disorder   . Post traumatic stress disorder     "flipping out" after people close to her died  . Heart murmur   . Arthritis     r ankle-S/P surgery (2007)  . Anemia     childhood  . Depression     childhood  . Hepatitis C     biopsy in 2010-no rx-was supposed to see a hepatologist in [place].  With cirrhosis  . Neuropathy     in legs and feet and hands  . Cataracts, bilateral   . Scoliosis   . Diabetes mellitus     diagnosed in 1996-always been on insulin  . DKA (diabetic ketoacidoses)     Recurrent admissions for DKA, medication non-complaince, poor social situation  . Diabetic neuropathy, type I diabetes mellitus     numbness bilaterally feet  . HTN (hypertension) 10/12/2013   Past Surgical History  Procedure Laterality Date  . Ankle surgery  2007  . Endometrial biopsy  06/22/2012  . Cholecystectomy  04/07/2013  . Cholecystectomy N/A 04/07/2013    Procedure: LAPAROSCOPIC CHOLECYSTECTOMY WITH INTRAOPERATIVE CHOLANGIOGRAM;  Surgeon: Adin Hector, MD;  Location: [facility];  Service: General;  Laterality: N/A;  . Cesarean section      3 times   Social History:  reports that she has been smoking Cigarettes.  She has a 2.5 pack-year smoking history. She has never used  smokeless tobacco. She reports that she uses illicit drugs (Marijuana) about once per week. She reports that she does not drink alcohol.  Allergies  Allergen Reactions  . Penicillins Rash    Family History  Problem Relation Age of Onset  . Diabetes Mother     currently 2  . Fibromyalgia Mother   . Cirrhosis Father     died in 56  . Diabetes Maternal Grandmother   . Diabetes Maternal Aunt      Prior to Admission medications   Medication Sig Start Date End Date Taking? Authorizing Provider  aspirin EC  81 MG tablet Take 81 mg by mouth daily.     Historical Provider, MD  divalproex (DEPAKOTE) 250 MG DR tablet Take 1 tablet (250 mg total) by mouth every 12 (twelve) hours. 10/23/13   Erline Hau, MD  docusate sodium 100 MG CAPS Take 200 mg by mouth 2 (two) times daily as needed for mild constipation. 12/25/13   Thurnell Lose, MD  gabapentin (NEURONTIN) 400 MG capsule Take 2 capsules (800 mg total) by mouth 3 (three) times daily. 10/23/13   Erline Hau, MD  hyoscyamine (LEVSIN SL) 0.125 MG SL tablet Place 1 tablet (0.125 mg total) under the tongue 3 (three) times daily. 10/14/13   Theodis Blaze, MD  insulin NPH-regular Human (NOVOLIN 70/30) (70-30) 100 UNIT/ML injection Inject 35 Units into the skin 2 (two) times daily with a meal. Insulin syringes and needles as needed. 12/25/13   Thurnell Lose, MD  metFORMIN (GLUCOPHAGE) 1000 MG tablet Take 1,000 mg by mouth 2 (two) times daily.    Historical Provider, MD  metoCLOPramide (REGLAN) 10 MG tablet Take 1 tablet (10 mg total) by mouth 4 (four) times daily -  before meals and at bedtime. 11/04/13   Costin Karlyne Greenspan, MD  pantoprazole (PROTONIX) 40 MG tablet Take 1 tablet (40 mg total) by mouth daily. Switch for any other PPI at similar dose and frequency 12/25/13   Thurnell Lose, MD  polyethylene glycol (MIRALAX / GLYCOLAX) packet Take 17 g by mouth daily. 12/25/13   Thurnell Lose, MD  potassium chloride 20 MEQ TBCR Take 20 mEq by mouth daily. 11/24/13   Belkys A Regalado, MD  risperidone (RISPERDAL) 4 MG tablet Take 0.5 tablets (2 mg total) by mouth at bedtime. 10/23/13   Erline Hau, MD   Physical Exam: Filed Vitals:   01/21/14 1230 01/21/14 1244 01/21/14 1300 01/21/14 1330  BP: 131/91 131/91 173/96 165/98  Pulse: 86 84 86 72  Temp:      TempSrc:      Resp:  16    SpO2: 100% 100% 100% 100%    Wt Readings from Last 3 Encounters:  12/24/13 56.3 kg (124 lb 1.9 oz)  12/02/13 52.6 kg (115 lb 15.4 oz)  11/23/13  54.4 kg (119 lb 14.9 oz)    General:  Appears calm, alert and awake Eyes: PERRL, normal lids, irises & conjunctiva ENT: grossly normal hearing, lips & tongue, dry mucous membranes Neck: no LAD, masses or thyromegaly Cardiovascular: RRR, no m/r/g. No LE edema. Respiratory: CTA bilaterally, no w/r/r. Normal respiratory effort. Abdomen: soft, generalized discomfort, no rebound tenderness Skin: no rash or induration seen on limited exam Musculoskeletal: grossly normal tone BUE/BLE Psychiatric: grossly normal mood and affect, speech fluent and appropriate Neurologic: grossly non-focal.          Labs on Admission:  Basic Metabolic Panel:  Recent Labs Lab 01/21/14  1155 01/21/14 1357  NA 141 138  K 4.3 4.2  CL 102 103  CO2 20 16*  GLUCOSE 421* 344*  BUN 13 11  CREATININE 0.49* 0.38*  CALCIUM 10.2 9.0   Liver Function Tests:  Recent Labs Lab 01/21/14 1155  AST 62*  ALT 48*  ALKPHOS 103  BILITOT 0.4  PROT 8.2  ALBUMIN 4.1    Recent Labs Lab 01/21/14 1155  LIPASE 29   No results found for this basename: AMMONIA,  in the last 168 hours CBC:  Recent Labs Lab 01/21/14 1155  WBC 11.1*  NEUTROABS 9.2*  HGB 12.4  HCT 39.5  MCV 73.0*  PLT 152   Cardiac Enzymes:  Recent Labs Lab 01/21/14 1155  TROPONINI <0.30    BNP (last 3 results) No results found for this basename: PROBNP,  in the last 8760 hours CBG:  Recent Labs Lab 01/21/14 1045 01/21/14 1316 01/21/14 1435  GLUCAP 452* 297* 280*    Radiological Exams on Admission: No results found.   Assessment/Plan Active Problems:   Seizure disorder - stable will continue home regimen    Non compliance w medication regimen - With improvement in condition will plan on reinforcing medical compliance.  Will avoid opiod administration while in house to avoid any secondary gain    Bipolar 1 disorder - stable, continue home regimen    DM (diabetes mellitus), type 1, uncontrolled/DKA (diabetic  ketoacidoses) -Will place on insulin gtt and place glucostabilizer order set. Please review for details - Zofran prn for antiemetics      GERD (gastroesophageal reflux disease) - will continue protonix while patient in house   Code Status: full DVT Prophylaxis:heparin Family Communication: none at bedside Disposition Plan: Stepdown  Time spent: > 50 minutes  Mary [name] [facility] Pager [phone]  **Disclaimer: This note may have been dictated with voice recognition software. Similar sounding words can inadvertently be transcribed and this note may contain transcription errors which may not have been corrected upon publication of note.**

## 2014-01-21 NOTE — ED Notes (Signed)
Patient yelled at Admitting nurse. Patient stated, "I want my pain medicine. I don't want to answer questions."

## 2014-01-21 NOTE — ED Notes (Signed)
Per EMS- Patient c/o generalized abdominal pain, dry heaving, and weakness since 0800 today.

## 2014-01-21 NOTE — ED Notes (Signed)
MD James at bedside.  

## 2014-01-21 NOTE — ED Notes (Signed)
Bed: PP29 Expected date:  Expected time:  Means of arrival:  Comments: EMS-abdominal pain/hyperglycemia

## 2014-01-21 NOTE — ED Provider Notes (Signed)
CSN: [id number]     Arrival date & time 01/21/14  1002 History   First MD Initiated Contact with Patient 01/21/14 1004     Chief Complaint  Patient presents with  . Abdominal Pain  . Nausea      HPI  Patient presents with abdominal pain nausea and vomiting. Since her blood sugars are high at home. History of insulin-dependent diabetes mellitus. Multiple admissions for DKA. Denies chest pain. Denies fever. Denies pregnancy. Denies urinary symptoms.  Past Medical History  Diagnosis Date  . Kidney stone   . Seizure disorder     started with pregnancy of first son  . Gastritis   . Bipolar 2 disorder   . Post traumatic stress disorder     "flipping out" after people close to her died  . Heart murmur   . Arthritis     r ankle-S/P surgery (2007)  . Anemia     childhood  . Depression     childhood  . Hepatitis C     biopsy in 2010-no rx-was supposed to see a hepatologist in [place].  With cirrhosis  . Neuropathy     in legs and feet and hands  . Cataracts, bilateral   . Scoliosis   . Diabetes mellitus     diagnosed in 1996-always been on insulin  . DKA (diabetic ketoacidoses)     Recurrent admissions for DKA, medication non-complaince, poor social situation  . Diabetic neuropathy, type I diabetes mellitus     numbness bilaterally feet  . HTN (hypertension) 10/12/2013   Past Surgical History  Procedure Laterality Date  . Ankle surgery  2007  . Endometrial biopsy  06/22/2012  . Cholecystectomy  04/07/2013  . Cholecystectomy N/A 04/07/2013    Procedure: LAPAROSCOPIC CHOLECYSTECTOMY WITH INTRAOPERATIVE CHOLANGIOGRAM;  Surgeon: Adin Hector, MD;  Location: [facility];  Service: General;  Laterality: N/A;  . Cesarean section      3 times   Family History  Problem Relation Age of Onset  . Diabetes Mother     currently 63  . Fibromyalgia Mother   . Cirrhosis Father     died in 109  . Diabetes Maternal Grandmother   . Diabetes Maternal Aunt    History  Substance Use  Topics  . Smoking status: Current Every Day Smoker -- 0.10 packs/day for 25 years    Types: Cigarettes  . Smokeless tobacco: Never Used  . Alcohol Use: No     Comment: Endorses hasn't been drinking   OB History   Grav Para Term Preterm Abortions TAB SAB Ect Mult Living   4 3 3  0 1 0 1 0 1 2     Review of Systems  Constitutional: Negative for fever, chills, diaphoresis, appetite change and fatigue.  HENT: Negative for mouth sores, sore throat and trouble swallowing.   Eyes: Negative for visual disturbance.  Respiratory: Negative for cough, chest tightness, shortness of breath and wheezing.   Cardiovascular: Negative for chest pain.  Gastrointestinal: Positive for nausea, vomiting and abdominal pain. Negative for diarrhea and abdominal distention.  Endocrine: Positive for polydipsia, polyphagia and polyuria.  Genitourinary: Negative for dysuria, frequency and hematuria.  Musculoskeletal: Negative for gait problem.  Skin: Negative for color change, pallor and rash.  Neurological: Negative for dizziness, syncope, light-headedness and headaches.  Hematological: Does not bruise/bleed easily.  Psychiatric/Behavioral: Negative for behavioral problems and confusion.      Allergies  Penicillins  Home Medications   Prior to Admission medications   Medication Sig  Start Date End Date Taking? Authorizing Provider  aspirin EC 81 MG tablet Take 81 mg by mouth daily.     Historical Provider, MD  divalproex (DEPAKOTE) 250 MG DR tablet Take 1 tablet (250 mg total) by mouth every 12 (twelve) hours. 10/23/13   Erline Hau, MD  docusate sodium 100 MG CAPS Take 200 mg by mouth 2 (two) times daily as needed for mild constipation. 12/25/13   Thurnell Lose, MD  gabapentin (NEURONTIN) 400 MG capsule Take 2 capsules (800 mg total) by mouth 3 (three) times daily. 10/23/13   Erline Hau, MD  hyoscyamine (LEVSIN SL) 0.125 MG SL tablet Place 1 tablet (0.125 mg total) under the  tongue 3 (three) times daily. 10/14/13   Theodis Blaze, MD  insulin NPH-regular Human (NOVOLIN 70/30) (70-30) 100 UNIT/ML injection Inject 35 Units into the skin 2 (two) times daily with a meal. Insulin syringes and needles as needed. 12/25/13   Thurnell Lose, MD  metFORMIN (GLUCOPHAGE) 1000 MG tablet Take 1,000 mg by mouth 2 (two) times daily.    Historical Provider, MD  metoCLOPramide (REGLAN) 10 MG tablet Take 1 tablet (10 mg total) by mouth 4 (four) times daily -  before meals and at bedtime. 11/04/13   Costin Karlyne Greenspan, MD  pantoprazole (PROTONIX) 40 MG tablet Take 1 tablet (40 mg total) by mouth daily. Switch for any other PPI at similar dose and frequency 12/25/13   Thurnell Lose, MD  polyethylene glycol (MIRALAX / GLYCOLAX) packet Take 17 g by mouth daily. 12/25/13   Thurnell Lose, MD  potassium chloride 20 MEQ TBCR Take 20 mEq by mouth daily. 11/24/13   Belkys A Regalado, MD  risperidone (RISPERDAL) 4 MG tablet Take 0.5 tablets (2 mg total) by mouth at bedtime. 10/23/13   Erline Hau, MD   BP 165/98  Pulse 72  Temp(Src) 97.9 F (36.6 C) (Oral)  Resp 16  SpO2 100% Physical Exam  Constitutional: She is oriented to person, place, and time. She appears well-developed and well-nourished. No distress.  Appears ill. Indicates only a few words at a time. Not frankly dyspneic. Not altered.  HENT:  Head: Normocephalic.  Oropharynx:  MM dried.  Eyes: Conjunctivae are normal. Pupils are equal, round, and reactive to light. No scleral icterus.  Neck: Normal range of motion. Neck supple. No thyromegaly present.  Cardiovascular: Normal rate and regular rhythm.  Exam reveals no gallop and no friction rub.   No murmur heard. Pulmonary/Chest: Effort normal and breath sounds normal. No respiratory distress. She has no wheezes. She has no rales.  Abdominal: Soft. Bowel sounds are normal. She exhibits no distension. There is generalized tenderness. There is no rebound.  Musculoskeletal:  Normal range of motion.  Neurological: She is alert and oriented to person, place, and time.  Skin: Skin is warm and dry. No rash noted.  Psychiatric: She has a normal mood and affect. Her behavior is normal.    ED Course  CRITICAL CARE Performed by: Allisha Harter, [place] by: Tanna Furry Total critical care time: 60 minutes Critical care start time: 01/21/2014 1:57 PM Critical care end time: 01/21/2014 2:57 PM Critical care time was exclusive of separately billable procedures and treating other patients. Critical care was necessary to treat or prevent imminent or life-threatening deterioration of the following conditions: metabolic crisis (dka). Critical care was time spent personally by me on the following activities: blood draw for specimens, development of treatment plan with  patient or surrogate, discussions with consultants, discussions with primary provider, interpretation of cardiac output measurements, evaluation of patient's response to treatment, examination of patient, obtaining history from patient or surrogate, ordering and performing treatments and interventions, ordering and review of laboratory studies, pulse oximetry, re-evaluation of patient's condition and review of old charts.   (including critical care time) Labs Review Labs Reviewed  CBC WITH DIFFERENTIAL - Abnormal; Notable for the following:    WBC 11.1 (*)    RBC 5.41 (*)    MCV 73.0 (*)    MCH 22.9 (*)    Neutrophils Relative % 83 (*)    Neutro Abs 9.2 (*)    Monocytes Relative 1 (*)    All other components within normal limits  COMPREHENSIVE METABOLIC PANEL - Abnormal; Notable for the following:    Glucose, Bld 421 (*)    Creatinine, Ser 0.49 (*)    AST 62 (*)    ALT 48 (*)    Anion gap 19 (*)    All other components within normal limits  URINALYSIS, ROUTINE W REFLEX MICROSCOPIC - Abnormal; Notable for the following:    Glucose, UA >1000 (*)    Ketones, ur 40 (*)    All other components within normal  limits  URINE MICROSCOPIC-ADD ON - Abnormal; Notable for the following:    Bacteria, UA FEW (*)    All other components within normal limits  BASIC METABOLIC PANEL - Abnormal; Notable for the following:    CO2 16 (*)    Glucose, Bld 344 (*)    Creatinine, Ser 0.38 (*)    Anion gap 19 (*)    All other components within normal limits  CBG MONITORING, ED - Abnormal; Notable for the following:    Glucose-Capillary 452 (*)    All other components within normal limits  CBG MONITORING, ED - Abnormal; Notable for the following:    Glucose-Capillary 297 (*)    All other components within normal limits  CBG MONITORING, ED - Abnormal; Notable for the following:    Glucose-Capillary 280 (*)    All other components within normal limits  LIPASE, BLOOD  TROPONIN I  HCG, SERUM, QUALITATIVE  I-STAT CG4 LACTIC ACID, ED    Imaging Review No results found.   EKG Interpretation None      MDM   Final diagnoses:  Diabetic ketoacidosis without coma associated with type 1 diabetes mellitus    Patient hyperglycemic. Blood sugar has improved. Not clearing her acidosis, or clearing her anion gap yet. Plan will be admission for continued hydration and insulin infusion.    Tanna Furry, MD 01/21/14 [phone]

## 2014-01-21 NOTE — ED Provider Notes (Signed)
Angiocath insertion Performed by: Osvaldo Shipper  Consent: Verbal consent obtained. Risks and benefits: risks, benefits and alternatives were discussed Time out: Immediately prior to procedure a "time out" was called to verify the correct patient, procedure, equipment, support staff and site/side marked as required.  Preparation: Patient was prepped and draped in the usual sterile fashion.  Vein Location: L AC  Yes Ultrasound Guided  Gauge: 20  Normal blood return and flush without difficulty Patient tolerance: Patient tolerated the procedure well with no immediate complications.   I helped provide access due to patient being difficult stick.  Evelina Bucy, MD 01/21/14 1250

## 2014-01-22 DIAGNOSIS — E876 Hypokalemia: Secondary | ICD-10-CM

## 2014-01-22 DIAGNOSIS — E1010 Type 1 diabetes mellitus with ketoacidosis without coma: Secondary | ICD-10-CM | POA: Diagnosis not present

## 2014-01-22 LAB — CBC
HEMATOCRIT: 34.4 % — AB (ref 36.0–46.0)
Hemoglobin: 10.8 g/dL — ABNORMAL LOW (ref 12.0–15.0)
MCH: 23.2 pg — AB (ref 26.0–34.0)
MCHC: 31.4 g/dL (ref 30.0–36.0)
MCV: 74 fL — AB (ref 78.0–100.0)
Platelets: 139 10*3/uL — ABNORMAL LOW (ref 150–400)
RBC: 4.65 MIL/uL (ref 3.87–5.11)
RDW: 15.7 % — ABNORMAL HIGH (ref 11.5–15.5)
WBC: 13.2 10*3/uL — ABNORMAL HIGH (ref 4.0–10.5)

## 2014-01-22 LAB — BASIC METABOLIC PANEL
ANION GAP: 12 (ref 5–15)
ANION GAP: 11 (ref 5–15)
BUN: 10 mg/dL (ref 6–23)
BUN: 11 mg/dL (ref 6–23)
CALCIUM: 8.8 mg/dL (ref 8.4–10.5)
CALCIUM: 9 mg/dL (ref 8.4–10.5)
CO2: 21 meq/L (ref 19–32)
CO2: 23 mEq/L (ref 19–32)
Chloride: 107 mEq/L (ref 96–112)
Chloride: 109 mEq/L (ref 96–112)
Creatinine, Ser: 0.5 mg/dL (ref 0.50–1.10)
Creatinine, Ser: 0.54 mg/dL (ref 0.50–1.10)
GFR calc Af Amer: >90 mL/min (ref >90)
GFR calc Af Amer: >90 mL/min (ref >90)
GFR calc non Af Amer: >90 mL/min (ref >90)
Glucose, Bld: 149 mg/dL — ABNORMAL HIGH (ref 70–99)
Glucose, Bld: 80 mg/dL (ref 70–99)
POTASSIUM: 3.1 meq/L — AB (ref 3.7–5.3)
Potassium: 3.5 mEq/L — ABNORMAL LOW (ref 3.7–5.3)
SODIUM: 143 meq/L (ref 137–147)
Sodium: 140 mEq/L (ref 137–147)

## 2014-01-22 LAB — GLUCOSE, CAPILLARY
GLUCOSE-CAPILLARY: 132 mg/dL — AB (ref 70–99)
GLUCOSE-CAPILLARY: 100 mg/dL — AB (ref 70–99)
Glucose-Capillary: 159 mg/dL — ABNORMAL HIGH (ref 70–99)
Glucose-Capillary: 167 mg/dL — ABNORMAL HIGH (ref 70–99)

## 2014-01-22 MED ORDER — INSULIN ASPART 100 UNIT/ML ~~LOC~~ SOLN: 0.0000 [IU] | Freq: Three times a day (TID) | SUBCUTANEOUS | Status: DC

## 2014-01-22 MED ORDER — METFORMIN HCL 1000 MG PO TABS
1000.0000 mg | ORAL_TABLET | Freq: Two times a day (BID) | ORAL | Status: DC
Start: 2014-01-22 — End: 2014-01-27

## 2014-01-22 MED ORDER — INSULIN ASPART 100 UNIT/ML ~~LOC~~ SOLN
3.0000 [IU] | Freq: Once | SUBCUTANEOUS | Status: AC
  Administered 2014-01-22: 3 [IU] via SUBCUTANEOUS

## 2014-01-22 MED ORDER — INSULIN ASPART 100 UNIT/ML ~~LOC~~ SOLN: 0.0000 [IU] | Freq: Every day | SUBCUTANEOUS | Status: DC

## 2014-01-22 MED ORDER — INSULIN GLARGINE 100 UNIT/ML ~~LOC~~ SOLN: 20.0000 [IU] | Freq: Every day | SUBCUTANEOUS | Status: DC

## 2014-01-22 MED ORDER — INSULIN GLARGINE 100 UNIT/ML ~~LOC~~ SOLN
20.0000 [IU] | Freq: Every day | SUBCUTANEOUS | Status: DC
  Administered 2014-01-22: 20 [IU] via SUBCUTANEOUS
  Filled 2014-01-22: qty 0.2

## 2014-01-22 MED ORDER — POTASSIUM CHLORIDE CRYS ER 20 MEQ PO TBCR
40.0000 meq | EXTENDED_RELEASE_TABLET | Freq: Once | ORAL | Status: AC
  Administered 2014-01-22: 40 meq via ORAL
  Filled 2014-01-22: qty 2

## 2014-01-22 MED ORDER — SODIUM CHLORIDE 0.9 % IV SOLN
INTRAVENOUS | Status: DC
  Administered 2014-01-22: 04:00:00 via INTRAVENOUS

## 2014-01-22 NOTE — Discharge Summary (Signed)
Physician Discharge Summary  Mary [name]:[id number] DOB: 01/14/1971 DOA: 01/21/2014  PCP: Elbert Ewings, FNP  Admit date: 01/21/2014 Discharge date: 01/22/2014  Time spent: > 35 minutes  Recommendations for Outpatient Follow-up:  1. Please be sure to continue monitoring blood sugars at least 2 times a day fasting and postprandial. 2. Also monitor potassium levels and recheck in 1 week 3. I will provide prescriptions for patients Lantus and metformin so that she can remain compliant on her medications. 4. I opioid Medications the patient was in house as there was narcotic seeking behavior and the goal was to avoid any secondary gain. Pain was treated with Tylenol and nausea was treated with anti-emetics.  Discharge Diagnoses:  Active Problems:   Seizure disorder   Non compliance w medication regimen   Bipolar 1 disorder   DM (diabetes mellitus), type 1, uncontrolled   DKA (diabetic ketoacidoses)   GERD (gastroesophageal reflux disease)   HTN (hypertension)   Discharge Condition: Stable  Diet recommendation: Diabetic diet  Filed Weights   01/21/14 1620 01/22/14 0500  Weight: 55.6 kg (122 lb 9.2 oz) 58.8 kg (129 lb 10.1 oz)    History of present illness:  43 y/o AAF with history of DMI and history of non compliance with hypoglycemic regimen. Who presented to the ED in DKA.  Hospital Course:   Active Problems:  Seizure disorder  - stable will continue home regimen   Non compliance w medication regimen  - Will provide prescription for diabetic medication as patient stated initially she had ran out of her medicines.  Bipolar 1 disorder  - stable, continue home regimen   Hypokalemia -Replace orally prior to discharge  DM (diabetes mellitus), type 1, uncontrolled/DKA (diabetic ketoacidoses)  -Placed on insulin gtt and placed glucostabilizer order set. - Acidosis resolved with gap calculated at 11 on day of discharge and bicarbonate at 23 - Provided scripts for home  regimen on discharge  GERD (gastroesophageal reflux disease)  - will continue protonix while patient in house  Procedures:  None  Consultations:  None  Discharge Exam: Filed Vitals:   01/22/14 0819  BP:   Pulse:   Temp: 98.9 F (37.2 C)  Resp:     General: Patient in no acute distress, alert and awake Cardiovascular: Regular rate and rhythm, no murmurs or rubs Respiratory: Clear to auscultation bilaterally, no wheezes  Discharge Instructions You were cared for by a hospitalist during your hospital stay. If you have any questions about your discharge medications or the care you received while you were in the hospital after you are discharged, you can call the unit and asked to speak with the hospitalist on call if the hospitalist that took care of you is not available. Once you are discharged, your primary care physician will handle any further medical issues. Please note that NO REFILLS for any discharge medications will be authorized once you are discharged, as it is imperative that you return to your primary care physician (or establish a relationship with a primary care physician if you do not have one) for your aftercare needs so that they can reassess your need for medications and monitor your lab values.  Discharge Instructions   Call MD for:  redness, tenderness, or signs of infection (pain, swelling, redness, odor or green/yellow discharge around incision site)    Complete by:  As directed      Call MD for:  temperature >100.4    Complete by:  As directed      Diet -  low sodium heart healthy    Complete by:  As directed      Discharge instructions    Complete by:  As directed   Please be sure to follow up with your primary care physician in 1-2 weeks or sooner.     Increase activity slowly    Complete by:  As directed             Medication List         aspirin EC 81 MG tablet  Take 81 mg by mouth daily.     divalproex 250 MG DR tablet  Commonly known as:   DEPAKOTE  Take 1 tablet (250 mg total) by mouth every 12 (twelve) hours.     DSS 100 MG Caps  Take 200 mg by mouth 2 (two) times daily as needed for mild constipation.     gabapentin 400 MG capsule  Commonly known as:  NEURONTIN  Take 2 capsules (800 mg total) by mouth 3 (three) times daily.     hyoscyamine 0.125 MG SL tablet  Commonly known as:  LEVSIN SL  Place 1 tablet (0.125 mg total) under the tongue 3 (three) times daily.     insulin glargine 100 UNIT/ML injection  Commonly known as:  LANTUS  Inject 0.2 mLs (20 Units total) into the skin daily.     insulin NPH-regular Human (70-30) 100 UNIT/ML injection  Commonly known as:  NOVOLIN 70/30  Inject 35 Units into the skin 2 (two) times daily with a meal. Insulin syringes and needles as needed.     metFORMIN 1000 MG tablet  Commonly known as:  GLUCOPHAGE  Take 1 tablet (1,000 mg total) by mouth 2 (two) times daily.     metoCLOPramide 10 MG tablet  Commonly known as:  REGLAN  Take 1 tablet (10 mg total) by mouth 4 (four) times daily -  before meals and at bedtime.     pantoprazole 40 MG tablet  Commonly known as:  PROTONIX  Take 1 tablet (40 mg total) by mouth daily. Switch for any other PPI at similar dose and frequency     polyethylene glycol packet  Commonly known as:  MIRALAX / GLYCOLAX  Take 17 g by mouth daily.     Potassium Chloride ER 20 MEQ Tbcr  Take 20 mEq by mouth daily.     risperidone 4 MG tablet  Commonly known as:  RISPERDAL  Take 0.5 tablets (2 mg total) by mouth at bedtime.       Allergies  Allergen Reactions  . Penicillins Rash      The results of significant diagnostics from this hospitalization (including imaging, microbiology, ancillary and laboratory) are listed below for reference.    Significant Diagnostic Studies: No results found.  Microbiology: Recent Results (from the past 240 hour(s))  MRSA PCR SCREENING     Status: None   Collection Time    01/21/14  4:21 PM      Result  Value Ref Range Status   MRSA by PCR NEGATIVE  NEGATIVE Final   Comment:            The GeneXpert MRSA Assay (FDA     approved for NASAL specimens     only), is one component of a     comprehensive MRSA colonization     surveillance program. It is not     intended to diagnose MRSA     infection nor to guide or     monitor treatment for  MRSA infections.     Labs: Basic Metabolic Panel:  Recent Labs Lab 01/21/14 1155 01/21/14 1357 01/21/14 1953 01/21/14 2317 01/22/14 0424 01/22/14 0701  NA 141 138 141 140 140 143  K 4.3 4.2 3.8 3.5* 3.5* 3.1*  CL 102 103 105 108 107 109  CO2 20 16* 22 21 21 23   GLUCOSE 421* 344* 119* 155* 149* 80  BUN 13 11 12 12 10 11   CREATININE 0.49* 0.38* 0.39* 0.44* 0.50 0.54  CALCIUM 10.2 9.0 9.1 8.8 8.8 9.0  MG 1.7  --   --   --   --   --   PHOS 2.9  --   --   --   --   --    Liver Function Tests:  Recent Labs Lab 01/21/14 1155  AST 62*  ALT 48*  ALKPHOS 103  BILITOT 0.4  PROT 8.2  ALBUMIN 4.1    Recent Labs Lab 01/21/14 1155  LIPASE 29   No results found for this basename: AMMONIA,  in the last 168 hours CBC:  Recent Labs Lab 01/21/14 1155 01/22/14 0424  WBC 11.1* 13.2*  NEUTROABS 9.2*  --   HGB 12.4 10.8*  HCT 39.5 34.4*  MCV 73.0* 74.0*  PLT 152 139*   Cardiac Enzymes:  Recent Labs Lab 01/21/14 1155  TROPONINI <0.30   BNP: BNP (last 3 results) No results found for this basename: PROBNP,  in the last 8760 hours CBG:  Recent Labs Lab 01/21/14 2204 01/21/14 2307 01/22/14 0017 01/22/14 0126 01/22/14 0806  GLUCAP 134* 159* 167* 132* 100*       Signed:  Velvet Bathe  [facility] 01/22/2014, 9:45 AM

## 2014-01-22 NOTE — Progress Notes (Signed)
Patient given discharged instructions and had no questions. Patient in no acute distress, vital signs stable, and no complaints of pain.

## 2014-01-24 LAB — GLUCOSE, CAPILLARY
Glucose-Capillary: 163 mg/dL — ABNORMAL HIGH (ref 70–99)
Glucose-Capillary: 193 mg/dL — ABNORMAL HIGH (ref 70–99)

## 2014-01-27 ENCOUNTER — Ambulatory Visit: Payer: Medicaid Other | Attending: Internal Medicine | Admitting: Internal Medicine

## 2014-01-27 ENCOUNTER — Encounter: Payer: Self-pay | Admitting: Internal Medicine

## 2014-01-27 VITALS — BP 104/61 | HR 98 | Temp 98.7°F | Resp 18 | Wt 124.8 lb

## 2014-01-27 DIAGNOSIS — Z794 Long term (current) use of insulin: Secondary | ICD-10-CM | POA: Insufficient documentation

## 2014-01-27 DIAGNOSIS — F17200 Nicotine dependence, unspecified, uncomplicated: Secondary | ICD-10-CM | POA: Insufficient documentation

## 2014-01-27 DIAGNOSIS — R569 Unspecified convulsions: Secondary | ICD-10-CM | POA: Insufficient documentation

## 2014-01-27 DIAGNOSIS — IMO0002 Reserved for concepts with insufficient information to code with codable children: Secondary | ICD-10-CM | POA: Diagnosis not present

## 2014-01-27 DIAGNOSIS — G629 Polyneuropathy, unspecified: Secondary | ICD-10-CM

## 2014-01-27 DIAGNOSIS — M4120 Other idiopathic scoliosis, site unspecified: Secondary | ICD-10-CM | POA: Insufficient documentation

## 2014-01-27 DIAGNOSIS — R011 Cardiac murmur, unspecified: Secondary | ICD-10-CM | POA: Insufficient documentation

## 2014-01-27 DIAGNOSIS — E1065 Type 1 diabetes mellitus with hyperglycemia: Secondary | ICD-10-CM

## 2014-01-27 DIAGNOSIS — Z833 Family history of diabetes mellitus: Secondary | ICD-10-CM | POA: Insufficient documentation

## 2014-01-27 DIAGNOSIS — G589 Mononeuropathy, unspecified: Secondary | ICD-10-CM

## 2014-01-27 DIAGNOSIS — E1142 Type 2 diabetes mellitus with diabetic polyneuropathy: Secondary | ICD-10-CM | POA: Insufficient documentation

## 2014-01-27 DIAGNOSIS — R7309 Other abnormal glucose: Secondary | ICD-10-CM | POA: Diagnosis not present

## 2014-01-27 DIAGNOSIS — E1049 Type 1 diabetes mellitus with other diabetic neurological complication: Secondary | ICD-10-CM | POA: Insufficient documentation

## 2014-01-27 DIAGNOSIS — B1920 Unspecified viral hepatitis C without hepatic coma: Secondary | ICD-10-CM | POA: Insufficient documentation

## 2014-01-27 DIAGNOSIS — M129 Arthropathy, unspecified: Secondary | ICD-10-CM | POA: Insufficient documentation

## 2014-01-27 DIAGNOSIS — Z7982 Long term (current) use of aspirin: Secondary | ICD-10-CM | POA: Insufficient documentation

## 2014-01-27 DIAGNOSIS — F3189 Other bipolar disorder: Secondary | ICD-10-CM | POA: Insufficient documentation

## 2014-01-27 DIAGNOSIS — E1010 Type 1 diabetes mellitus with ketoacidosis without coma: Secondary | ICD-10-CM | POA: Insufficient documentation

## 2014-01-27 DIAGNOSIS — I10 Essential (primary) hypertension: Secondary | ICD-10-CM | POA: Insufficient documentation

## 2014-01-27 DIAGNOSIS — R739 Hyperglycemia, unspecified: Secondary | ICD-10-CM

## 2014-01-27 LAB — POCT URINALYSIS DIPSTICK
BILIRUBIN UA: NEGATIVE
Blood, UA: NEGATIVE
GLUCOSE UA: 500
KETONES UA: NEGATIVE
Leukocytes, UA: NEGATIVE
Nitrite, UA: NEGATIVE
Protein, UA: NEGATIVE
SPEC GRAV UA: 1.015
Urobilinogen, UA: 0.2
pH, UA: 6

## 2014-01-27 LAB — GLUCOSE, POCT (MANUAL RESULT ENTRY)
POC GLUCOSE: 297 mg/dL — AB (ref 70–99)
POC Glucose: 323 mg/dl — AB (ref 70–99)

## 2014-01-27 MED ORDER — GABAPENTIN 300 MG PO CAPS: 300.0000 mg | ORAL_CAPSULE | Freq: Three times a day (TID) | ORAL | Status: DC

## 2014-01-27 MED ORDER — ASPIRIN EC 81 MG PO TBEC: 81.0000 mg | DELAYED_RELEASE_TABLET | Freq: Every day | ORAL | Status: DC

## 2014-01-27 MED ORDER — INSULIN ASPART 100 UNIT/ML ~~LOC~~ SOLN
10.0000 [IU] | Freq: Once | SUBCUTANEOUS | Status: AC
  Administered 2014-01-27: 10 [IU] via SUBCUTANEOUS

## 2014-01-27 MED ORDER — INSULIN NPH ISOPHANE & REGULAR (70-30) 100 UNIT/ML ~~LOC~~ SUSP: 15.0000 [IU] | Freq: Two times a day (BID) | SUBCUTANEOUS | Status: DC

## 2014-01-27 MED ORDER — INSULIN GLARGINE 100 UNIT/ML ~~LOC~~ SOLN: 30.0000 [IU] | Freq: Every day | SUBCUTANEOUS | Status: DC

## 2014-01-27 NOTE — Progress Notes (Signed)
Patient ID: URI COVEY, female   DOB: 07/14/1970, 43 y.o.   MRN: [id number]  OZY:[id number]  [id number]  DOB - May 16, 1971  CC:  Chief Complaint  Patient presents with  . Establish Care  . Diabetes    Type 1       HPI: Mary [name] is a 43 y.o. female here today to establish medical care.  Patient presents to clinic today for diabetes management.  Has been in and out of the hospital for DKA.  She states that she has been out of her Lantus and 70/30 for one week.  She states that even though she had insulin she was continuing to have BS readings of 200-600.  She reports that she always takes her medication on a regular basis.     Allergies  Allergen Reactions  . Penicillins Rash   Past Medical History  Diagnosis Date  . Kidney stone   . Seizure disorder     started with pregnancy of first son  . Gastritis   . Bipolar 2 disorder   . Post traumatic stress disorder     "flipping out" after people close to her died  . Heart murmur   . Arthritis     r ankle-S/P surgery (2007)  . Anemia     childhood  . Depression     childhood  . Hepatitis C     biopsy in 2010-no rx-was supposed to see a hepatologist in [place].  With cirrhosis  . Neuropathy     in legs and feet and hands  . Cataracts, bilateral   . Scoliosis   . Diabetes mellitus     diagnosed in 1996-always been on insulin  . DKA (diabetic ketoacidoses)     Recurrent admissions for DKA, medication non-complaince, poor social situation  . Diabetic neuropathy, type I diabetes mellitus     numbness bilaterally feet  . HTN (hypertension) 10/12/2013   Current Outpatient Prescriptions on File Prior to Visit  Medication Sig Dispense Refill  . aspirin EC 81 MG tablet Take 81 mg by mouth daily.       . insulin glargine (LANTUS) 100 UNIT/ML injection Inject 0.2 mLs (20 Units total) into the skin daily.  10 mL  0  . insulin NPH-regular Human (NOVOLIN 70/30) (70-30) 100 UNIT/ML injection Inject 35 Units into the skin  2 (two) times daily with a meal. Insulin syringes and needles as needed.  10 mL  1  . divalproex (DEPAKOTE) 250 MG DR tablet Take 1 tablet (250 mg total) by mouth every 12 (twelve) hours.  60 tablet  2  . docusate sodium 100 MG CAPS Take 200 mg by mouth 2 (two) times daily as needed for mild constipation.  20 capsule  0  . gabapentin (NEURONTIN) 400 MG capsule Take 2 capsules (800 mg total) by mouth 3 (three) times daily.  180 capsule  1  . hyoscyamine (LEVSIN SL) 0.125 MG SL tablet Place 1 tablet (0.125 mg total) under the tongue 3 (three) times daily.  90 tablet  3  . metFORMIN (GLUCOPHAGE) 1000 MG tablet Take 1 tablet (1,000 mg total) by mouth 2 (two) times daily.  60 tablet  0  . metoCLOPramide (REGLAN) 10 MG tablet Take 1 tablet (10 mg total) by mouth 4 (four) times daily -  before meals and at bedtime.  120 tablet  1  . pantoprazole (PROTONIX) 40 MG tablet Take 1 tablet (40 mg total) by mouth daily. Switch for any other PPI at similar dose  and frequency  30 tablet  3  . polyethylene glycol (MIRALAX / GLYCOLAX) packet Take 17 g by mouth daily.  14 each  0  . potassium chloride 20 MEQ TBCR Take 20 mEq by mouth daily.  3 tablet  0  . risperidone (RISPERDAL) 4 MG tablet Take 0.5 tablets (2 mg total) by mouth at bedtime.  30 tablet  1   No current facility-administered medications on file prior to visit.   Family History  Problem Relation Age of Onset  . Diabetes Mother     currently 59  . Fibromyalgia Mother   . Cirrhosis Father     died in 23  . Diabetes Maternal Grandmother   . Diabetes Maternal Aunt    History   Social History  . Marital Status: Single    Spouse Name: N/A    Number of Children: N/A  . Years of Education: N/A   Occupational History  . Not on file.   Social History Main Topics  . Smoking status: Current Every Day Smoker -- 0.10 packs/day for 25 years    Types: Cigarettes  . Smokeless tobacco: Never Used  . Alcohol Use: No     Comment: Endorses hasn't been  drinking  . Drug Use: 1.00 per week    Special: Marijuana     Comment: used crack last year.  every now and then uses puffs some marijuana-only on the weekends<?>  . Sexual Activity: Yes    Birth Control/ Protection: None   Other Topics Concern  . 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 Came back to [place] from [facility]-was in rehab for 3 mo. Then [facility] to [facility] Currently 05/2011 endorses not doing any drugs except very occasional marijuana when she's nauseous.    Lives in [place] with BF, has grown children, 2 sons. Ambulatory.               Review of Systems  Constitutional: Negative.   Eyes: Positive for blurred vision and pain (shooting pain in bil eyes). Negative for discharge and redness.  Cardiovascular: Positive for leg swelling. Negative for chest pain, palpitations and PND.  Gastrointestinal: Positive for nausea and abdominal pain. Negative for vomiting.  Genitourinary: Negative.   Neurological: Positive for dizziness, tingling and headaches.  Endo/Heme/Allergies: Positive for polydipsia.      Objective:   Filed Vitals:   01/27/14 1606  BP: 104/61  Pulse: 98  Temp: 98.7 F (37.1 C)  Resp: 18    Physical Exam: Constitutional: Patient appears well-developed and well-nourished. No distress. HENT: Normocephalic, atraumatic, External right and left ear normal. Oropharynx is clear and moist.  Eyes: Conjunctivae and EOM are normal. PERRLA, no scleral icterus. Neck: Normal ROM. Neck supple. No JVD. No tracheal deviation. No thyromegaly. CVS: RRR, S1/S2 +, no murmurs, no gallops, no carotid bruit.  Pulmonary: Effort and breath sounds normal, no stridor, rhonchi, wheezes, rales.  Abdominal: Soft. BS +, no distension, tenderness, rebound or guarding.  Musculoskeletal: Normal range of motion. No edema and no tenderness. Bilateral feet intact and without lesions    Lymphadenopathy: No lymphadenopathy noted, cervical Neuro: Alert. Normal reflexes, muscle tone coordination. No cranial nerve deficit. Skin: Skin is warm and dry. No rash noted. Not diaphoretic. No erythema. No pallor. Psychiatric: Normal mood and affect. Behavior, judgment, thought content normal.  Lab Results  Component Value Date   WBC 13.2* 01/22/2014   HGB  10.8* 01/22/2014   HCT 34.4* 01/22/2014   MCV 74.0* 01/22/2014   PLT 139* 01/22/2014   Lab Results  Component Value Date   CREATININE 0.54 01/22/2014   BUN 11 01/22/2014   NA 143 01/22/2014   K 3.1* 01/22/2014   CL 109 01/22/2014   CO2 23 01/22/2014    Lab Results  Component Value Date   HGBA1C 13.2* 12/23/2013   Lipid Panel     Component Value Date/Time   CHOL  Value: 134        ATP III CLASSIFICATION:  <200     mg/dL   Desirable  200-239  mg/dL   Borderline High  >=240    mg/dL   High        04/16/2010 0157   TRIG 185* 04/16/2010 0157   HDL 40 04/16/2010 0157   CHOLHDL 3.4 04/16/2010 0157   VLDL 37 04/16/2010 0157   LDLCALC  Value: 57        Total Cholesterol/HDL:CHD Risk Coronary Heart Disease Risk Table                     Men   Women  1/2 Average Risk   3.4   3.3  Average Risk       5.0   4.4  2 X Average Risk   9.6   7.1  3 X Average Risk  23.4   11.0        Use the calculated Patient Ratio above and the CHD Risk Table to determine the patient's CHD Risk.        ATP III CLASSIFICATION (LDL):  <100     mg/dL   Optimal  100-129  mg/dL   Near or Above                    Optimal  130-159  mg/dL   Borderline  160-189  mg/dL   High  >190     mg/dL   Very High 04/16/2010 0157       Assessment and plan:   Mary [name] was seen today for establish care and diabetes.  Diagnoses and associated orders for this visit:  DM (diabetes mellitus), type 1, uncontrolled - Glucose (CBG) - insulin glargine (LANTUS) 100 UNIT/ML injection; Inject 0.3 mLs (30 Units total) into the skin daily. - insulin NPH-regular Human (NOVOLIN 70/30) (70-30) 100 UNIT/ML  injection; Inject 15 Units into the skin 2 (two) times daily with a meal. Insulin syringes and needles as needed. - aspirin EC 81 MG tablet; Take 1 tablet (81 mg total) by mouth daily.  Neuropathy - gabapentin (NEURONTIN) 300 MG capsule; Take 1 capsule (300 mg total) by mouth 3 (three) times daily.  Hyperglycemia - insulin aspart (novoLOG) injection 10 Units; Inject 0.1 mLs (10 Units total) into the skin once. - Urinalysis Dipstick - Glucose (CBG)   She will need to return in 1 week for nurse visit-CBG check, 3 mo PCP  The patient was given clear instructions to go to ER or return to medical center if symptoms don't improve, worsen or new problems develop.  [name], [facility] [phone] 01/27/2014, 4:34 PM

## 2014-01-27 NOTE — Progress Notes (Signed)
Patient presents to establish care for Type 1 DM States she was diagnosed at age 43 States blood sugars at home are running 200-500 States ran out of both insulins this AM States ran out of all other meds 3-4 weeks ago

## 2014-02-14 ENCOUNTER — Ambulatory Visit: Payer: MEDICAID | Attending: Internal Medicine

## 2014-02-18 ENCOUNTER — Other Ambulatory Visit: Payer: Self-pay | Admitting: Internal Medicine

## 2014-02-18 DIAGNOSIS — E1065 Type 1 diabetes mellitus with hyperglycemia: Secondary | ICD-10-CM

## 2014-02-18 DIAGNOSIS — IMO0002 Reserved for concepts with insufficient information to code with codable children: Secondary | ICD-10-CM

## 2014-02-18 MED ORDER — DIVALPROEX SODIUM 250 MG PO DR TAB: 250.0000 mg | DELAYED_RELEASE_TABLET | Freq: Two times a day (BID) | ORAL | Status: DC

## 2014-02-18 MED ORDER — RISPERIDONE 2 MG PO TABS: 2.0000 mg | ORAL_TABLET | Freq: Every day | ORAL | Status: DC

## 2014-02-18 MED ORDER — INSULIN NPH ISOPHANE & REGULAR (70-30) 100 UNIT/ML ~~LOC~~ SUSP: 15.0000 [IU] | Freq: Two times a day (BID) | SUBCUTANEOUS | Status: DC

## 2014-02-18 MED ORDER — INSULIN GLARGINE 100 UNIT/ML SOLOSTAR PEN: 30.0000 [IU] | PEN_INJECTOR | Freq: Every day | SUBCUTANEOUS | Status: DC

## 2014-02-22 ENCOUNTER — Encounter (HOSPITAL_COMMUNITY): Payer: Self-pay | Admitting: Emergency Medicine

## 2014-02-22 ENCOUNTER — Inpatient Hospital Stay (HOSPITAL_COMMUNITY)
Admission: EM | Admit: 2014-02-22 | Discharge: 2014-02-24 | DRG: 637 | Disposition: A | Discharge status: Home / Self Care | Payer: Medicaid Other | Attending: Internal Medicine | Admitting: Internal Medicine

## 2014-02-22 DIAGNOSIS — Z91199 Patient's noncompliance with other medical treatment and regimen due to unspecified reason: Secondary | ICD-10-CM

## 2014-02-22 DIAGNOSIS — F319 Bipolar disorder, unspecified: Secondary | ICD-10-CM | POA: Diagnosis present

## 2014-02-22 DIAGNOSIS — F17200 Nicotine dependence, unspecified, uncomplicated: Secondary | ICD-10-CM | POA: Diagnosis present

## 2014-02-22 DIAGNOSIS — Z9114 Patient's other noncompliance with medication regimen: Secondary | ICD-10-CM

## 2014-02-22 DIAGNOSIS — B1920 Unspecified viral hepatitis C without hepatic coma: Secondary | ICD-10-CM | POA: Diagnosis present

## 2014-02-22 DIAGNOSIS — Z794 Long term (current) use of insulin: Secondary | ICD-10-CM

## 2014-02-22 DIAGNOSIS — Z7982 Long term (current) use of aspirin: Secondary | ICD-10-CM | POA: Diagnosis not present

## 2014-02-22 DIAGNOSIS — E1142 Type 2 diabetes mellitus with diabetic polyneuropathy: Secondary | ICD-10-CM | POA: Diagnosis present

## 2014-02-22 DIAGNOSIS — E1149 Type 2 diabetes mellitus with other diabetic neurological complication: Secondary | ICD-10-CM

## 2014-02-22 DIAGNOSIS — H269 Unspecified cataract: Secondary | ICD-10-CM | POA: Diagnosis present

## 2014-02-22 DIAGNOSIS — K3184 Gastroparesis: Secondary | ICD-10-CM | POA: Diagnosis not present

## 2014-02-22 DIAGNOSIS — Z79899 Other long term (current) drug therapy: Secondary | ICD-10-CM

## 2014-02-22 DIAGNOSIS — R112 Nausea with vomiting, unspecified: Secondary | ICD-10-CM | POA: Diagnosis not present

## 2014-02-22 DIAGNOSIS — E876 Hypokalemia: Secondary | ICD-10-CM | POA: Diagnosis present

## 2014-02-22 DIAGNOSIS — G40909 Epilepsy, unspecified, not intractable, without status epilepticus: Secondary | ICD-10-CM | POA: Diagnosis present

## 2014-02-22 DIAGNOSIS — E43 Unspecified severe protein-calorie malnutrition: Secondary | ICD-10-CM | POA: Diagnosis present

## 2014-02-22 DIAGNOSIS — E860 Dehydration: Secondary | ICD-10-CM | POA: Diagnosis present

## 2014-02-22 DIAGNOSIS — Z23 Encounter for immunization: Secondary | ICD-10-CM

## 2014-02-22 DIAGNOSIS — K7460 Unspecified cirrhosis of liver: Secondary | ICD-10-CM | POA: Diagnosis present

## 2014-02-22 DIAGNOSIS — I10 Essential (primary) hypertension: Secondary | ICD-10-CM | POA: Diagnosis present

## 2014-02-22 DIAGNOSIS — E1143 Type 2 diabetes mellitus with diabetic autonomic (poly)neuropathy: Secondary | ICD-10-CM

## 2014-02-22 DIAGNOSIS — E1010 Type 1 diabetes mellitus with ketoacidosis without coma: Principal | ICD-10-CM | POA: Diagnosis present

## 2014-02-22 DIAGNOSIS — Z9119 Patient's noncompliance with other medical treatment and regimen: Secondary | ICD-10-CM

## 2014-02-22 DIAGNOSIS — Z720 Tobacco use: Secondary | ICD-10-CM

## 2014-02-22 DIAGNOSIS — K219 Gastro-esophageal reflux disease without esophagitis: Secondary | ICD-10-CM | POA: Diagnosis present

## 2014-02-22 DIAGNOSIS — E1065 Type 1 diabetes mellitus with hyperglycemia: Secondary | ICD-10-CM | POA: Diagnosis not present

## 2014-02-22 DIAGNOSIS — E1049 Type 1 diabetes mellitus with other diabetic neurological complication: Secondary | ICD-10-CM | POA: Diagnosis present

## 2014-02-22 DIAGNOSIS — IMO0002 Reserved for concepts with insufficient information to code with codable children: Secondary | ICD-10-CM | POA: Diagnosis not present

## 2014-02-22 DIAGNOSIS — E1110 Type 2 diabetes mellitus with ketoacidosis without coma: Secondary | ICD-10-CM | POA: Diagnosis present

## 2014-02-22 LAB — COMPREHENSIVE METABOLIC PANEL
ALT: 46 U/L — ABNORMAL HIGH (ref 0–35)
ANION GAP: 20 — AB (ref 5–15)
AST: 50 U/L — ABNORMAL HIGH (ref 0–37)
Albumin: 3.9 g/dL (ref 3.5–5.2)
Alkaline Phosphatase: 101 U/L (ref 39–117)
BUN: 18 mg/dL (ref 6–23)
CALCIUM: 10 mg/dL (ref 8.4–10.5)
CO2: 18 meq/L — AB (ref 19–32)
CREATININE: 0.45 mg/dL — AB (ref 0.50–1.10)
Chloride: 101 mEq/L (ref 96–112)
GLUCOSE: 449 mg/dL — AB (ref 70–99)
Potassium: 4.2 mEq/L (ref 3.7–5.3)
Sodium: 139 mEq/L (ref 137–147)
Total Bilirubin: 0.3 mg/dL (ref 0.3–1.2)
Total Protein: 7.8 g/dL (ref 6.0–8.3)

## 2014-02-22 LAB — BASIC METABOLIC PANEL
ANION GAP: 18 — AB (ref 5–15)
ANION GAP: 19 — AB (ref 5–15)
Anion gap: 21 — ABNORMAL HIGH (ref 5–15)
Anion gap: 21 — ABNORMAL HIGH (ref 5–15)
BUN: 16 mg/dL (ref 6–23)
BUN: 17 mg/dL (ref 6–23)
BUN: 16 mg/dL (ref 6–23)
BUN: 17 mg/dL (ref 6–23)
CALCIUM: 9.7 mg/dL (ref 8.4–10.5)
CALCIUM: 9.7 mg/dL (ref 8.4–10.5)
CO2: 19 mEq/L (ref 19–32)
CO2: 18 mEq/L — ABNORMAL LOW (ref 19–32)
CO2: 19 meq/L (ref 19–32)
CO2: 16 mEq/L — ABNORMAL LOW (ref 19–32)
Calcium: 9.5 mg/dL (ref 8.4–10.5)
Calcium: 9.7 mg/dL (ref 8.4–10.5)
Chloride: 104 mEq/L (ref 96–112)
Chloride: 104 mEq/L (ref 96–112)
Chloride: 101 mEq/L (ref 96–112)
Chloride: 108 mEq/L (ref 96–112)
Creatinine, Ser: 0.42 mg/dL — ABNORMAL LOW (ref 0.50–1.10)
Creatinine, Ser: 0.44 mg/dL — ABNORMAL LOW (ref 0.50–1.10)
Creatinine, Ser: 0.47 mg/dL — ABNORMAL LOW (ref 0.50–1.10)
Creatinine, Ser: 0.53 mg/dL (ref 0.50–1.10)
GFR calc Af Amer: >90 mL/min (ref >90)
GFR calc non Af Amer: >90 mL/min (ref >90)
GLUCOSE: 339 mg/dL — AB (ref 70–99)
Glucose, Bld: 396 mg/dL — ABNORMAL HIGH (ref 70–99)
Glucose, Bld: 364 mg/dL — ABNORMAL HIGH (ref 70–99)
Glucose, Bld: 281 mg/dL — ABNORMAL HIGH (ref 70–99)
POTASSIUM: 3.9 meq/L (ref 3.7–5.3)
Potassium: 4.1 mEq/L (ref 3.7–5.3)
Potassium: 4.3 mEq/L (ref 3.7–5.3)
Potassium: 4.2 mEq/L (ref 3.7–5.3)
SODIUM: 141 meq/L (ref 137–147)
SODIUM: 141 meq/L (ref 137–147)
SODIUM: 141 meq/L (ref 137–147)
Sodium: 145 mEq/L (ref 137–147)

## 2014-02-22 LAB — CBC WITH DIFFERENTIAL/PLATELET
BASOS PCT: 0 % (ref 0–1)
Basophils Absolute: 0 10*3/uL (ref 0.0–0.1)
EOS ABS: 0.1 10*3/uL (ref 0.0–0.7)
Eosinophils Relative: 1 % (ref 0–5)
HCT: 37.5 % (ref 36.0–46.0)
HEMOGLOBIN: 12 g/dL (ref 12.0–15.0)
LYMPHS PCT: 18 % (ref 12–46)
Lymphs Abs: 2.7 10*3/uL (ref 0.7–4.0)
MCH: 23 pg — ABNORMAL LOW (ref 26.0–34.0)
MCHC: 32 g/dL (ref 30.0–36.0)
MCV: 71.8 fL — ABNORMAL LOW (ref 78.0–100.0)
Monocytes Absolute: 0.3 10*3/uL (ref 0.1–1.0)
Monocytes Relative: 2 % — ABNORMAL LOW (ref 3–12)
NEUTROS PCT: 79 % — AB (ref 43–77)
Neutro Abs: 11.7 10*3/uL — ABNORMAL HIGH (ref 1.7–7.7)
Platelets: 198 10*3/uL (ref 150–400)
RBC: 5.22 MIL/uL — AB (ref 3.87–5.11)
RDW: 14.1 % (ref 11.5–15.5)
WBC: 14.8 10*3/uL — AB (ref 4.0–10.5)

## 2014-02-22 LAB — URINALYSIS, ROUTINE W REFLEX MICROSCOPIC
Bilirubin Urine: NEGATIVE
Hgb urine dipstick: NEGATIVE
KETONES UR: 15 mg/dL — AB
Leukocytes, UA: NEGATIVE
Nitrite: NEGATIVE
Protein, ur: NEGATIVE mg/dL
Specific Gravity, Urine: 1.034 — ABNORMAL HIGH (ref 1.005–1.030)
Urobilinogen, UA: 0.2 mg/dL (ref 0.0–1.0)
pH: 6.5 (ref 5.0–8.0)

## 2014-02-22 LAB — CBG MONITORING, ED
GLUCOSE-CAPILLARY: 469 mg/dL — AB (ref 70–99)
Glucose-Capillary: 366 mg/dL — ABNORMAL HIGH (ref 70–99)

## 2014-02-22 LAB — GLUCOSE, CAPILLARY
GLUCOSE-CAPILLARY: 393 mg/dL — AB (ref 70–99)
GLUCOSE-CAPILLARY: 313 mg/dL — AB (ref 70–99)
GLUCOSE-CAPILLARY: 313 mg/dL — AB (ref 70–99)
GLUCOSE-CAPILLARY: 265 mg/dL — AB (ref 70–99)
GLUCOSE-CAPILLARY: 297 mg/dL — AB (ref 70–99)
Glucose-Capillary: 110 mg/dL — ABNORMAL HIGH (ref 70–99)
Glucose-Capillary: 186 mg/dL — ABNORMAL HIGH (ref 70–99)
Glucose-Capillary: 247 mg/dL — ABNORMAL HIGH (ref 70–99)
Glucose-Capillary: 255 mg/dL — ABNORMAL HIGH (ref 70–99)
Glucose-Capillary: 305 mg/dL — ABNORMAL HIGH (ref 70–99)
Glucose-Capillary: 345 mg/dL — ABNORMAL HIGH (ref 70–99)

## 2014-02-22 LAB — URINE MICROSCOPIC-ADD ON

## 2014-02-22 LAB — TROPONIN I

## 2014-02-22 LAB — KETONES, QUALITATIVE

## 2014-02-22 LAB — VALPROIC ACID LEVEL: Valproic Acid Lvl: <10 ug/mL — ABNORMAL LOW (ref 50.0–100.0)

## 2014-02-22 LAB — LIPASE, BLOOD: LIPASE: 30 U/L (ref 11–59)

## 2014-02-22 MED ORDER — MORPHINE SULFATE 2 MG/ML IJ SOLN
1.0000 mg | INTRAMUSCULAR | Status: DC | PRN
  Administered 2014-02-22 – 2014-02-24 (×5): 1 mg via INTRAVENOUS
  Filled 2014-02-22 (×5): qty 1

## 2014-02-22 MED ORDER — ENOXAPARIN SODIUM 40 MG/0.4ML ~~LOC~~ SOLN
40.0000 mg | SUBCUTANEOUS | Status: DC
  Administered 2014-02-22 – 2014-02-23 (×2): 40 mg via SUBCUTANEOUS
  Filled 2014-02-22 (×3): qty 0.4

## 2014-02-22 MED ORDER — SODIUM CHLORIDE 0.9 % IV SOLN
INTRAVENOUS | Status: DC
  Administered 2014-02-22: 2.9 [IU]/h via INTRAVENOUS
  Administered 2014-02-22: 5.1 [IU]/h via INTRAVENOUS
  Administered 2014-02-22: 7.4 [IU]/h via INTRAVENOUS
  Filled 2014-02-22: qty 2.5

## 2014-02-22 MED ORDER — SODIUM CHLORIDE 0.9 % IV SOLN
1000.0000 mL | Freq: Once | INTRAVENOUS | Status: AC
  Administered 2014-02-22: 1000 mL via INTRAVENOUS

## 2014-02-22 MED ORDER — SODIUM CHLORIDE 0.9 % IV SOLN
1000.0000 mL | INTRAVENOUS | Status: DC
  Administered 2014-02-22: 1000 mL via INTRAVENOUS

## 2014-02-22 MED ORDER — MORPHINE SULFATE 2 MG/ML IJ SOLN
2.0000 mg | Freq: Once | INTRAMUSCULAR | Status: AC
  Administered 2014-02-22: 2 mg via INTRAVENOUS
  Filled 2014-02-22: qty 1

## 2014-02-22 MED ORDER — SODIUM CHLORIDE 0.9 % IV SOLN
INTRAVENOUS | Status: DC
  Administered 2014-02-22 – 2014-02-24 (×5): via INTRAVENOUS

## 2014-02-22 MED ORDER — SODIUM CHLORIDE 0.9 % IV SOLN: INTRAVENOUS | Status: AC

## 2014-02-22 MED ORDER — ONDANSETRON HCL 4 MG/2ML IJ SOLN
4.0000 mg | INTRAMUSCULAR | Status: DC | PRN
  Administered 2014-02-22: 4 mg via INTRAVENOUS
  Filled 2014-02-22: qty 2

## 2014-02-22 MED ORDER — PANTOPRAZOLE SODIUM 40 MG IV SOLR
40.0000 mg | Freq: Two times a day (BID) | INTRAVENOUS | Status: DC
  Administered 2014-02-22 – 2014-02-23 (×3): 40 mg via INTRAVENOUS
  Filled 2014-02-22 (×4): qty 40

## 2014-02-22 MED ORDER — ONDANSETRON HCL 4 MG/2ML IJ SOLN
4.0000 mg | Freq: Four times a day (QID) | INTRAMUSCULAR | Status: DC | PRN
  Administered 2014-02-23: 4 mg via INTRAVENOUS
  Filled 2014-02-22: qty 2

## 2014-02-22 MED ORDER — DEXTROSE-NACL 5-0.45 % IV SOLN
INTRAVENOUS | Status: DC
  Administered 2014-02-22: via INTRAVENOUS

## 2014-02-22 MED ORDER — METOCLOPRAMIDE HCL 5 MG/ML IJ SOLN
5.0000 mg | Freq: Four times a day (QID) | INTRAMUSCULAR | Status: DC
  Administered 2014-02-22 – 2014-02-24 (×8): 5 mg via INTRAVENOUS
  Filled 2014-02-22 (×2): qty 1
  Filled 2014-02-22: qty 2
  Filled 2014-02-22 (×4): qty 1
  Filled 2014-02-22: qty 2
  Filled 2014-02-22 (×2): qty 1

## 2014-02-22 MED ORDER — POTASSIUM CHLORIDE 10 MEQ/100ML IV SOLN
10.0000 meq | INTRAVENOUS | Status: AC
  Administered 2014-02-23 (×2): 10 meq via INTRAVENOUS
  Filled 2014-02-22 (×4): qty 100

## 2014-02-22 MED ORDER — DEXTROSE 50 % IV SOLN: 25.0000 mL | INTRAVENOUS | Status: DC | PRN

## 2014-02-22 MED ORDER — SODIUM CHLORIDE 0.9 % IV SOLN
INTRAVENOUS | Status: DC
  Administered 2014-02-22: 4.1 [IU]/h via INTRAVENOUS
  Filled 2014-02-22: qty 2.5

## 2014-02-22 MED ORDER — ACETAMINOPHEN 325 MG PO TABS: 650.0000 mg | ORAL_TABLET | Freq: Four times a day (QID) | ORAL | Status: DC | PRN

## 2014-02-22 MED ORDER — PROMETHAZINE HCL 25 MG/ML IJ SOLN
12.5000 mg | Freq: Three times a day (TID) | INTRAMUSCULAR | Status: DC | PRN
  Administered 2014-02-22: 12.5 mg via INTRAVENOUS
  Filled 2014-02-22: qty 1

## 2014-02-22 MED ORDER — INFLUENZA VAC SPLIT QUAD 0.5 ML IM SUSY
0.5000 mL | PREFILLED_SYRINGE | INTRAMUSCULAR | Status: AC
  Administered 2014-02-23: 0.5 mL via INTRAMUSCULAR
  Filled 2014-02-22 (×2): qty 0.5

## 2014-02-22 MED ORDER — SODIUM CHLORIDE 0.9 % IV BOLUS (SEPSIS)
500.0000 mL | Freq: Once | INTRAVENOUS | Status: AC
  Administered 2014-02-22: 500 mL via INTRAVENOUS

## 2014-02-22 MED ORDER — DEXTROSE-NACL 5-0.45 % IV SOLN: INTRAVENOUS | Status: DC

## 2014-02-22 MED ORDER — DEXTROSE 5 % IV SOLN
250.0000 mg | Freq: Two times a day (BID) | INTRAVENOUS | Status: DC
  Administered 2014-02-22 – 2014-02-24 (×4): 250 mg via INTRAVENOUS
  Filled 2014-02-22 (×4): qty 2.5

## 2014-02-22 NOTE — ED Provider Notes (Addendum)
Angiocath insertion Performed by: Lolita Patella  Consent: Verbal consent obtained. Risks and benefits: risks, benefits and alternatives were discussed Time out: Immediately prior to procedure a "time out" was called to verify the correct patient, procedure, equipment, support staff and site/side marked as required.  Preparation: Patient was prepped and draped in the usual sterile fashion.  Vein Location: LT AC  Ultrasound Guided  Gauge: 20g  Normal blood return and flush without difficulty Patient tolerance: Patient tolerated the procedure well with no immediate complications.  Pt seen with PA.  I agree with his assessment and treatment plan.  IV fluid bolus initiated  Insulin qtt initiated. Pt c/o nausea, vomiting and abdominal pain for 12 hours. Abdomen without peritoneal irritation.  BS present. Plan is ivf, insulin antiemetics, lab eval, probable DKA and admission for same.    11:31:  PT re-evaluated. Acidotic with bicarbonate of 18. Anion gap of 20. She's been given 500 cc over one liter bolus. Insulin drip has been initiated per the glucose stabilizer. Most recent vital signs BP 144/78. Heart rate 88. Saturations 99%. Oral temp 97 9. She continues to lay prone. She requests additional pain medicine and antiemetics. Has just been medicated for that. Not encephalopathic or delirious. Care discussed with Hospitalist. Patient will be admitted to a general medical bed under the [facility] team for diabetic ketoacidosis.  CRITICAL CARE Performed by: Tanna Furry JOSEPH   Total critical care time: 1 hour.  Start 10:33am.  Stop:  11:33am.  Critical care time was exclusive of separately billable procedures and treating other patients.  Critical care was necessary to treat or prevent imminent or life-threatening deterioration.  Critical care was time spent personally by me on the following activities: development of treatment plan with patient and/or surrogate as well as  nursing, discussions with consultants, evaluation of patient's response to treatment, examination of patient, obtaining history from patient or surrogate, ordering and performing treatments and interventions, ordering and review of laboratory studies, ordering and review of radiographic studies, pulse oximetry and re-evaluation of patient's condition.  Initiation of ivf bolus and insulin infusion.   Tanna Furry, MD 02/22/14 [place], MD 02/22/14 [place], MD 02/22/14 1134

## 2014-02-22 NOTE — Progress Notes (Signed)
Agree with previous RN assessment. Will continue to monitor pt.

## 2014-02-22 NOTE — H&P (Signed)
History and Physical  Mary [name]:[id number] DOB: [birth year] DOA: 02/22/2014  Referring physician: Dr. Tanna Furry, EDP PCP: Elbert Ewings, [facility]  Outpatient Specialists:  1. None  Chief Complaint: Nausea, vomiting and abdominal pain.  HPI: Mary [name] is a 43 y.o. 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 Patient is not a good historian. Although she is alert, she is not forthcoming regarding her history. She states that she has one-day history of nausea, vomiting x5-yellow colored, denies coffee-ground emesis or blood, unable to keep anything down, associated with upper abdominal pain but no constipation or diarrhea. Declines to tell us regarding severity of pain, radiation, relieving, aggravating factors or associated symptoms. Denies fever, chills, dysuria or urinary frequency. Claims to have taken insulin up to yesterday. Denies chest pain, palpitations, dyspnea, cough. In the ED, bicarbonate 18, glucose 449, AST 50, ALT 46, WBC 14.8, troponin x1 negative and anion gap 20. Hospitalist admission requested.   Review of Systems: All systems reviewed and apart from history of presenting illness, are negative.  Past Medical History  Diagnosis Date  . Kidney stone   . Seizure disorder     started with pregnancy of first son  . Gastritis   . Bipolar 2 disorder   . Post traumatic stress disorder     "flipping out" after people close to her died  . Heart murmur   . Arthritis     r ankle-S/P surgery (2007)  . Anemia     childhood  . Depression     childhood  . Hepatitis C     biopsy in 2010-no rx-was supposed to see a hepatologist in  [place].  With cirrhosis  . Neuropathy     in legs and feet and hands  . Cataracts, bilateral   . Scoliosis   . Diabetes mellitus     diagnosed in 1996-always been on insulin  . DKA (diabetic ketoacidoses)     Recurrent admissions for DKA, medication non-complaince, poor social situation  . Diabetic neuropathy, type I diabetes mellitus     numbness bilaterally feet  . HTN (hypertension) 10/12/2013   Past Surgical History  Procedure Laterality Date  . Ankle surgery  2007  . Endometrial biopsy  06/22/2012  . Cholecystectomy  04/07/2013  . Cholecystectomy N/A 04/07/2013    Procedure: LAPAROSCOPIC CHOLECYSTECTOMY WITH INTRAOPERATIVE CHOLANGIOGRAM;  Surgeon: Adin Hector, MD;  Location: [facility];  Service: General;  Laterality: N/A;  . Cesarean section      3 times   Social History:  reports that she has been smoking Cigarettes.  She has a 2.5 pack-year smoking history. She has never used smokeless tobacco. She reports that she uses illicit drugs (Marijuana) about once per week. She reports that she does not drink alcohol.   Allergies  Allergen Reactions  . Penicillins Rash    Family History  Problem Relation Age of Onset  . Diabetes Mother     currently 63  . Fibromyalgia Mother   . Cirrhosis Father     died in 4  . Diabetes Maternal Grandmother   . Diabetes Maternal Aunt     Prior to Admission medications   Medication Sig Start Date End Date Taking? Authorizing Provider  aspirin  EC 81 MG tablet Take 1 tablet (81 mg total) by mouth daily. 01/27/14   Lance Bosch, NP  divalproex (DEPAKOTE) 250 MG DR tablet Take 1 tablet (250 mg total) by mouth every 12 (twelve) hours. 02/18/14   Tresa Garter, MD  docusate sodium 100 MG CAPS Take 200 mg by mouth 2 (two) times daily as needed for mild constipation. 12/25/13   Thurnell Lose, MD  estradiol cypionate (DEPO-ESTRADIOL) 5 MG/ML injection Inject into the muscle every 28 (twenty-eight) days.    Historical Provider, MD    gabapentin (NEURONTIN) 300 MG capsule Take 1 capsule (300 mg total) by mouth 3 (three) times daily. 01/27/14   Lance Bosch, NP  gabapentin (NEURONTIN) 400 MG capsule Take 2 capsules (800 mg total) by mouth 3 (three) times daily. 10/23/13   Erline Hau, MD  hyoscyamine (LEVSIN SL) 0.125 MG SL tablet Place 1 tablet (0.125 mg total) under the tongue 3 (three) times daily. 10/14/13   Theodis Blaze, MD  Insulin Glargine (LANTUS SOLOSTAR) 100 UNIT/ML Solostar Pen Inject 30 Units into the skin daily at 10 pm. 02/18/14   Tresa Garter, MD  insulin NPH-regular Human (NOVOLIN 70/30) (70-30) 100 UNIT/ML injection Inject 15 Units into the skin 2 (two) times daily with a meal. Insulin syringes and needles as needed. 02/18/14   Tresa Garter, MD  medroxyPROGESTERone (DEPO-PROVERA) 150 MG/ML injection Inject 150 mg into the muscle every 3 (three) months.    Historical Provider, MD  metoCLOPramide (REGLAN) 10 MG tablet Take 1 tablet (10 mg total) by mouth 4 (four) times daily -  before meals and at bedtime. 11/04/13   Costin Karlyne Greenspan, MD  pantoprazole (PROTONIX) 40 MG tablet Take 1 tablet (40 mg total) by mouth daily. Switch for any other PPI at similar dose and frequency 12/25/13   Thurnell Lose, MD  polyethylene glycol (MIRALAX / GLYCOLAX) packet Take 17 g by mouth daily. 12/25/13   Thurnell Lose, MD  potassium chloride 20 MEQ TBCR Take 20 mEq by mouth daily. 11/24/13   Belkys A Regalado, MD  risperidone (RISPERDAL) 2 MG tablet Take 1 tablet (2 mg total) by mouth at bedtime. 02/18/14   Tresa Garter, MD   Physical Exam: Filed Vitals:   02/22/14 0856 02/22/14 1130  BP:  144/78  Resp:  16  SpO2: 100% 100%   pulse rate 104 per minute regular and afebrile.   General exam: Moderately built and thinly nourished young female patient, lying uncomfortably supine on the gurney with intermittent dry heaves.  Head, eyes and ENT: Nontraumatic and normocephalic. Pupils equally reacting to  light and accommodation. Oral mucosa dry.  Neck: Supple. No JVD, carotid bruit or thyromegaly.  Lymphatics: No lymphadenopathy.  Respiratory system: Clear to auscultation. No increased work of breathing.  Cardiovascular system: S1 and S2 heard, RRR. No JVD, murmurs, gallops, clicks or pedal edema.  Gastrointestinal system: Abdomen is nondistended, soft and nontender. Normal bowel sounds heard. No organomegaly or masses appreciated.  Central nervous system: Alert and oriented. No focal neurological deficits.  Extremities: Symmetric 5 x 5 power. Peripheral pulses symmetrically felt.   Skin: No rashes or acute findings.  Musculoskeletal system: Negative exam.  Psychiatry: Pleasant and cooperative.   Labs on Admission:  Basic Metabolic Panel:  Recent Labs Lab 02/22/14 0949  NA 139  K 4.2  CL 101  CO2 18*  GLUCOSE 449*  BUN 18  CREATININE 0.45*  CALCIUM 10.0   Liver  Function Tests:  Recent Labs Lab 02/22/14 0949  AST 50*  ALT 46*  ALKPHOS 101  BILITOT 0.3  PROT 7.8  ALBUMIN 3.9    Recent Labs Lab 02/22/14 0949  LIPASE 30   No results found for this basename: AMMONIA,  in the last 168 hours CBC:  Recent Labs Lab 02/22/14 0949  WBC 14.8*  NEUTROABS 11.7*  HGB 12.0  HCT 37.5  MCV 71.8*  PLT 198   Cardiac Enzymes:  Recent Labs Lab 02/22/14 0949  TROPONINI <0.30    BNP (last 3 results) No results found for this basename: PROBNP,  in the last 8760 hours CBG:  Recent Labs Lab 02/22/14 0913 02/22/14 1116  GLUCAP 469* 366*    Radiological Exams on Admission: No results found.  EKG: Independently reviewed. Normal sinus rhythm, normal axis, RSR pattern in leads V1-2 and no other acute findings.  Assessment/Plan Principal Problem:   DKA, type 1, not at goal Active Problems:   Seizure disorder   Non compliance w medication regimen   Tobacco abuse   Bipolar 1 disorder   Nausea with vomiting   Gastroparesis due to DM   GERD  (gastroesophageal reflux disease)   HTN (hypertension)   Protein-calorie malnutrition, severe   DKA (diabetic ketoacidoses)   1. DKA in poorly controlled type I DM: Likely precipitated by poor compliance with medications versus inability to keep PO from nausea and vomiting. Admit to telemetry. Hydrate aggressively with IV fluids. Continue IV insulin drip. Monitor BMP closely and transition to Lantus and SSI once DKA resolved and able to tolerate by mouth. A1c 13.2 on 12/23/13 2. Dehydration: Secondary to nausea and vomiting. IV fluids 3. Nausea, vomiting and abdominal pain/gastroparesis: May be secondary to DKA versus diabetic gastroparesis versus gastritis or other etiologies. N.p.o. IV PPI. When necessary antiemetics. IV Reglan scheduled. Resume clear liquids when DKA resolves and symptoms subside. 4. History of seizure disorder: Does not tell us regarding last seizure episode or compliance with medications. Change Depakote to IV. Seizure precautions. 5. Noncompliance with medications: Seems to be a chronic problem. Counsel when able to participate. Will tobacco abuse: Cessation counseled. 6. History of bipolar disorder: Await medication reconciliation by pharmacy.? Contributing to noncompliance. 7. History of GERD: IV PPI 8. History of hypertension: Does not seem to be on medications at home. Controlled here. Monitor closely. 9. Severe protein calorie malnutrition: Likely secondary to poorly controlled DM. 10. History of hepatitis C with cirrhosis: Minimally elevated transaminases. Not on treatment. Outpatient followup.    Code Status: Full  Family Communication: None at bedside.  Disposition Plan: Home when medically stable.   Time spent: 70 minutes.  Vernell Leep, MD, FACP, FHM. [facility] Pager [phone]  If 7PM-7AM, please contact night-coverage [email or web address] Password [facility] 02/22/2014, 12:24 PM

## 2014-02-22 NOTE — ED Notes (Signed)
Per [facility]- Pt c/o of N/V and elevated CBG 487- Per roommate pt is non compliant with meds. Frequent visits to ED for presenting complaints.

## 2014-02-22 NOTE — Progress Notes (Signed)
UR completed 

## 2014-02-22 NOTE — Progress Notes (Signed)
Received report from ED, PT arrived unit, alert and oriented. MD notified of Pt's location, will continue with current plan of care.

## 2014-02-22 NOTE — ED Notes (Signed)
Bed: YM41 Expected date:  Expected time:  Means of arrival:  Comments: EMS, Hyperglycemia

## 2014-02-22 NOTE — ED Provider Notes (Signed)
CSN: [id number]     Arrival date & time 02/22/14  0855 History   First MD Initiated Contact with Patient 02/22/14 [phone]     Chief Complaint  Patient presents with  . Hyperglycemia  . Nausea  . Emesis     (Consider location/radiation/quality/duration/timing/severity/associated sxs/prior Treatment) HPI Mary [name] is a 43 y.o. female with a history of DKA and seizure disorder who presents today via EMS in DKA. She denies nausea and vomiting and per her roommate she has not been compliant with her medications. She is lethargic now and does not answer questions.  Past Medical History  Diagnosis Date  . Kidney stone   . Seizure disorder     started with pregnancy of first son  . Gastritis   . Bipolar 2 disorder   . Post traumatic stress disorder     "flipping out" after people close to her died  . Heart murmur   . Arthritis     r ankle-S/P surgery (2007)  . Anemia     childhood  . Depression     childhood  . Hepatitis C     biopsy in 2010-no rx-was supposed to see a hepatologist in [place].  With cirrhosis  . Neuropathy     in legs and feet and hands  . Cataracts, bilateral   . Scoliosis   . Diabetes mellitus     diagnosed in 1996-always been on insulin  . DKA (diabetic ketoacidoses)     Recurrent admissions for DKA, medication non-complaince, poor social situation  . Diabetic neuropathy, type I diabetes mellitus     numbness bilaterally feet  . HTN (hypertension) 10/12/2013   Past Surgical History  Procedure Laterality Date  . Ankle surgery  2007  . Endometrial biopsy  06/22/2012  . Cholecystectomy  04/07/2013  . Cholecystectomy N/A 04/07/2013    Procedure: LAPAROSCOPIC CHOLECYSTECTOMY WITH INTRAOPERATIVE CHOLANGIOGRAM;  Surgeon: Adin Hector, MD;  Location: [facility];  Service: General;  Laterality: N/A;  . Cesarean section      3 times   Family History  Problem Relation Age of Onset  . Diabetes Mother     currently 51  . Fibromyalgia Mother   . Cirrhosis  Father     died in 50  . Diabetes Maternal Grandmother   . Diabetes Maternal Aunt    History  Substance Use Topics  . Smoking status: Current Every Day Smoker -- 0.10 packs/day for 25 years    Types: Cigarettes  . Smokeless tobacco: Never Used  . Alcohol Use: No     Comment: Endorses hasn't been drinking   OB History   Grav Para Term Preterm Abortions TAB SAB Ect Mult Living   4 3 3  0 1 0 1 0 1 2     Review of Systems  Unable to perform ROS  Patient only complains of nausea and vomiting with abdominal discomfort. Does not answer any other review of systems questions   Allergies  Penicillins  Home Medications   Prior to Admission medications   Medication Sig Start Date End Date Taking? Authorizing Provider  aspirin EC 81 MG tablet Take 1 tablet (81 mg total) by mouth daily. 01/27/14   Lance Bosch, NP  divalproex (DEPAKOTE) 250 MG DR tablet Take 1 tablet (250 mg total) by mouth every 12 (twelve) hours. 02/18/14   Tresa Garter, MD  docusate sodium 100 MG CAPS Take 200 mg by mouth 2 (two) times daily as needed for mild constipation. 12/25/13  Thurnell Lose, MD  estradiol cypionate (DEPO-ESTRADIOL) 5 MG/ML injection Inject into the muscle every 28 (twenty-eight) days.    Historical Provider, MD  gabapentin (NEURONTIN) 300 MG capsule Take 1 capsule (300 mg total) by mouth 3 (three) times daily. 01/27/14   Lance Bosch, NP  gabapentin (NEURONTIN) 400 MG capsule Take 2 capsules (800 mg total) by mouth 3 (three) times daily. 10/23/13   Erline Hau, MD  hyoscyamine (LEVSIN SL) 0.125 MG SL tablet Place 1 tablet (0.125 mg total) under the tongue 3 (three) times daily. 10/14/13   Theodis Blaze, MD  Insulin Glargine (LANTUS SOLOSTAR) 100 UNIT/ML Solostar Pen Inject 30 Units into the skin daily at 10 pm. 02/18/14   Tresa Garter, MD  insulin NPH-regular Human (NOVOLIN 70/30) (70-30) 100 UNIT/ML injection Inject 15 Units into the skin 2 (two) times daily with a  meal. Insulin syringes and needles as needed. 02/18/14   Tresa Garter, MD  medroxyPROGESTERone (DEPO-PROVERA) 150 MG/ML injection Inject 150 mg into the muscle every 3 (three) months.    Historical Provider, MD  metoCLOPramide (REGLAN) 10 MG tablet Take 1 tablet (10 mg total) by mouth 4 (four) times daily -  before meals and at bedtime. 11/04/13   Costin Karlyne Greenspan, MD  pantoprazole (PROTONIX) 40 MG tablet Take 1 tablet (40 mg total) by mouth daily. Switch for any other PPI at similar dose and frequency 12/25/13   Thurnell Lose, MD  polyethylene glycol (MIRALAX / GLYCOLAX) packet Take 17 g by mouth daily. 12/25/13   Thurnell Lose, MD  potassium chloride 20 MEQ TBCR Take 20 mEq by mouth daily. 11/24/13   Belkys A Regalado, MD  risperidone (RISPERDAL) 2 MG tablet Take 1 tablet (2 mg total) by mouth at bedtime. 02/18/14   Tresa Garter, MD   BP 144/78  Resp 16  SpO2 100%  LMP 02/15/2013 Physical Exam  Nursing note and vitals reviewed. Constitutional:  Pt lying prone on ED bed, is lethargic, but not delirious.  HENT:  Head: Normocephalic and atraumatic.  Eyes: Conjunctivae are normal. Right eye exhibits no discharge. Left eye exhibits no discharge. No scleral icterus.  Neck: Neck supple. No JVD present.  Pulmonary/Chest: Effort normal. No respiratory distress.  Abdominal: Soft. She exhibits no distension.  No peritoneal signs  Skin: Skin is warm and dry. No rash noted.  Psychiatric:  Patient does not answer any questions, unless she is asking for pain medicine or nausea medicine    ED Course  Procedures (including critical care time) Labs Review Labs Reviewed  CBC WITH DIFFERENTIAL - Abnormal; Notable for the following:    WBC 14.8 (*)    RBC 5.22 (*)    MCV 71.8 (*)    MCH 23.0 (*)    Neutrophils Relative % 79 (*)    Monocytes Relative 2 (*)    Neutro Abs 11.7 (*)    All other components within normal limits  COMPREHENSIVE METABOLIC PANEL - Abnormal; Notable for the  following:    CO2 18 (*)    Glucose, Bld 449 (*)    Creatinine, Ser 0.45 (*)    AST 50 (*)    ALT 46 (*)    Anion gap 20 (*)    All other components within normal limits  KETONES, QUALITATIVE - Abnormal; Notable for the following:    Acetone, Bld SMALL (*)    All other components within normal limits  CBG MONITORING, ED - Abnormal; Notable for  the following:    Glucose-Capillary 469 (*)    All other components within normal limits  CBG MONITORING, ED - Abnormal; Notable for the following:    Glucose-Capillary 366 (*)    All other components within normal limits  LIPASE, BLOOD  TROPONIN I  VALPROIC ACID LEVEL    Imaging Review No results found.   EKG Interpretation None     Meds given in ED:  Medications  dextrose 5 %-0.45 % sodium chloride infusion (not administered)  insulin regular (NOVOLIN R,HUMULIN R) 250 Units in sodium chloride 0.9 % 250 mL (1 Units/mL) infusion (3.1 Units/hr Intravenous Rate/Dose Change 02/22/14 1119)  0.9 %  sodium chloride infusion (1,000 mLs Intravenous New Bag/Given 02/22/14 0939)    Followed by  0.9 %  sodium chloride infusion (1,000 mLs Intravenous New Bag/Given 02/22/14 1119)  ondansetron (ZOFRAN) injection 4 mg (4 mg Intravenous Given 02/22/14 1119)  morphine 2 MG/ML injection 2 mg (2 mg Intravenous Given 02/22/14 1119)    New Prescriptions   No medications on file   Filed Vitals:   02/22/14 1130  BP: 144/78  Resp: 16     Once peripheral IV placed at bedside, pt lucidly asks for pain medication. Discussed pt course with Dr. Tanna Furry,  Admitted pt to hospital for further eval and management of DKA. Vital stable in ED, admit to hospital MDM   Final diagnoses:  Diabetic ketoacidosis without coma associated with type 1 diabetes mellitus  Prior to patient admission, I discussed and reviewed this case with Dr.Mark Kayleen Memos, PA-C 02/22/14 1203

## 2014-02-23 DIAGNOSIS — IMO0002 Reserved for concepts with insufficient information to code with codable children: Secondary | ICD-10-CM

## 2014-02-23 DIAGNOSIS — E1065 Type 1 diabetes mellitus with hyperglycemia: Secondary | ICD-10-CM

## 2014-02-23 DIAGNOSIS — F17200 Nicotine dependence, unspecified, uncomplicated: Secondary | ICD-10-CM

## 2014-02-23 LAB — CBC
HCT: 38.2 % (ref 36.0–46.0)
HEMOGLOBIN: 12.2 g/dL (ref 12.0–15.0)
MCH: 23.2 pg — ABNORMAL LOW (ref 26.0–34.0)
MCHC: 31.9 g/dL (ref 30.0–36.0)
MCV: 72.6 fL — ABNORMAL LOW (ref 78.0–100.0)
Platelets: 166 10*3/uL (ref 150–400)
RBC: 5.26 MIL/uL — AB (ref 3.87–5.11)
RDW: 14.6 % (ref 11.5–15.5)
WBC: 16.6 10*3/uL — AB (ref 4.0–10.5)

## 2014-02-23 LAB — BASIC METABOLIC PANEL
Anion gap: 15 (ref 5–15)
Anion gap: 16 — ABNORMAL HIGH (ref 5–15)
Anion gap: 12 (ref 5–15)
BUN: 18 mg/dL (ref 6–23)
BUN: 19 mg/dL (ref 6–23)
BUN: 20 mg/dL (ref 6–23)
CALCIUM: 9.2 mg/dL (ref 8.4–10.5)
CO2: 20 mEq/L (ref 19–32)
CO2: 20 mEq/L (ref 19–32)
CO2: 19 meq/L (ref 19–32)
CREATININE: 0.5 mg/dL (ref 0.50–1.10)
Calcium: 9.1 mg/dL (ref 8.4–10.5)
Calcium: 9.2 mg/dL (ref 8.4–10.5)
Chloride: 113 mEq/L — ABNORMAL HIGH (ref 96–112)
Chloride: 108 mEq/L (ref 96–112)
Chloride: 105 mEq/L (ref 96–112)
Creatinine, Ser: 0.5 mg/dL (ref 0.50–1.10)
Creatinine, Ser: 0.47 mg/dL — ABNORMAL LOW (ref 0.50–1.10)
GFR calc Af Amer: >90 mL/min (ref >90)
GFR calc Af Amer: >90 mL/min (ref >90)
GFR calc Af Amer: >90 mL/min (ref >90)
GLUCOSE: 294 mg/dL — AB (ref 70–99)
Glucose, Bld: 111 mg/dL — ABNORMAL HIGH (ref 70–99)
Glucose, Bld: 173 mg/dL — ABNORMAL HIGH (ref 70–99)
POTASSIUM: 4.2 meq/L (ref 3.7–5.3)
Potassium: 4 mEq/L (ref 3.7–5.3)
Potassium: 4.5 mEq/L (ref 3.7–5.3)
Sodium: 148 mEq/L — ABNORMAL HIGH (ref 137–147)
Sodium: 144 mEq/L (ref 137–147)
Sodium: 136 mEq/L — ABNORMAL LOW (ref 137–147)

## 2014-02-23 LAB — GLUCOSE, CAPILLARY
GLUCOSE-CAPILLARY: 125 mg/dL — AB (ref 70–99)
GLUCOSE-CAPILLARY: 260 mg/dL — AB (ref 70–99)
Glucose-Capillary: 134 mg/dL — ABNORMAL HIGH (ref 70–99)
Glucose-Capillary: 78 mg/dL (ref 70–99)
Glucose-Capillary: 152 mg/dL — ABNORMAL HIGH (ref 70–99)
Glucose-Capillary: 161 mg/dL — ABNORMAL HIGH (ref 70–99)
Glucose-Capillary: 211 mg/dL — ABNORMAL HIGH (ref 70–99)
Glucose-Capillary: 174 mg/dL — ABNORMAL HIGH (ref 70–99)
Glucose-Capillary: 212 mg/dL — ABNORMAL HIGH (ref 70–99)

## 2014-02-23 MED ORDER — INSULIN ASPART 100 UNIT/ML ~~LOC~~ SOLN
7.0000 [IU] | Freq: Three times a day (TID) | SUBCUTANEOUS | Status: DC
Start: 2014-02-23 — End: 2014-02-23

## 2014-02-23 MED ORDER — DEXTROSE 50 % IV SOLN: 25.0000 mL | Freq: Once | INTRAVENOUS | Status: AC | PRN

## 2014-02-23 MED ORDER — GLUCOSE 40 % PO GEL: 1.0000 | ORAL | Status: DC | PRN

## 2014-02-23 MED ORDER — INSULIN ASPART 100 UNIT/ML ~~LOC~~ SOLN
0.0000 [IU] | Freq: Three times a day (TID) | SUBCUTANEOUS | Status: DC
  Administered 2014-02-23: 2 [IU] via SUBCUTANEOUS
  Administered 2014-02-23: 3 [IU] via SUBCUTANEOUS
  Administered 2014-02-23: 5 [IU] via SUBCUTANEOUS
  Administered 2014-02-24: 2 [IU] via SUBCUTANEOUS
  Administered 2014-02-24: 3 [IU] via SUBCUTANEOUS

## 2014-02-23 MED ORDER — INSULIN ASPART 100 UNIT/ML ~~LOC~~ SOLN
4.0000 [IU] | Freq: Three times a day (TID) | SUBCUTANEOUS | Status: DC
  Administered 2014-02-23 (×2): 4 [IU] via SUBCUTANEOUS

## 2014-02-23 MED ORDER — DEXTROSE 50 % IV SOLN: 50.0000 mL | Freq: Once | INTRAVENOUS | Status: AC | PRN

## 2014-02-23 MED ORDER — INSULIN ASPART 100 UNIT/ML ~~LOC~~ SOLN
0.0000 [IU] | Freq: Every day | SUBCUTANEOUS | Status: DC
  Administered 2014-02-23: 2 [IU] via SUBCUTANEOUS

## 2014-02-23 MED ORDER — INSULIN GLARGINE 100 UNIT/ML ~~LOC~~ SOLN
36.0000 [IU] | Freq: Every day | SUBCUTANEOUS | Status: DC
  Administered 2014-02-24: 36 [IU] via SUBCUTANEOUS
  Filled 2014-02-23: qty 0.36

## 2014-02-23 MED ORDER — PANTOPRAZOLE SODIUM 40 MG PO TBEC
40.0000 mg | DELAYED_RELEASE_TABLET | Freq: Two times a day (BID) | ORAL | Status: DC
  Administered 2014-02-23 – 2014-02-24 (×2): 40 mg via ORAL
  Filled 2014-02-23 (×2): qty 1

## 2014-02-23 MED ORDER — INSULIN ASPART 100 UNIT/ML ~~LOC~~ SOLN
6.0000 [IU] | Freq: Three times a day (TID) | SUBCUTANEOUS | Status: DC
  Administered 2014-02-23 – 2014-02-24 (×3): 6 [IU] via SUBCUTANEOUS

## 2014-02-23 MED ORDER — INSULIN GLARGINE 100 UNIT/ML ~~LOC~~ SOLN
30.0000 [IU] | Freq: Every day | SUBCUTANEOUS | Status: DC
  Administered 2014-02-23: 30 [IU] via SUBCUTANEOUS
  Filled 2014-02-23: qty 0.3

## 2014-02-23 NOTE — Progress Notes (Signed)
Inpatient Diabetes Program Recommendations  AACE/ADA: New Consensus Statement on Inpatient Glycemic Control (2013)  Target Ranges:  Prepandial:   less than 140 mg/dL      Peak postprandial:   less than 180 mg/dL (1-2 hours)      Critically ill patients:  140 - 180 mg/dL     Patient admitted with DKA (Anion gap 20 on admission).  Patient well known to the Hospitalists and the Inpatient DM Program.  Multiple ED visits, Multiple Admissions for DKA, Noncompliance with insulin, etc.  Has been counseled extensively in the past regarding the importance of blood sugar control at home.  Noted Patient's Home insulin regimen is as follows: Lantus 30 units QHS 70/30 insulin- 15 units bid with meals   Per record review, note that Dr, Cruzita Lederer (on 12/03/13) and Dr. Candiss Norse (on 12/25/13) discharged patient home on 70/30 insulin 35 units bid with meals + Metformin 1000 mg bid due to patient's inability to afford Lantus anymore at home (see Dr. Arman Filter note below): Per Dr. Cruzita Lederer from Discharge Summary on 12/03/13: "She has been on Lantus in the past however can no longer afford that. I called her PCP's office and discussed with [facility] nurse who has mentioned that all the patient needs to do is come to her appointments, and she will try to set up again financial help with her home insulin. For now I will not change her home regimen since I don't think she will be able to afford a different insulin. Her home regimen is likely to work well I suspect that she is non compliant with insulin administration as she admitted to miss a dose just prior to coming to the hospital. Unfortunately she is at high risk for readmissions in the future."   Upon further investigation, note that patient went to establish care at the [facility] on 01/27/14.  Per note from [name], NP at the [facility], patient was given instructions to take the following insulin  regimen: Lantus 30 units daily 70/30 insulin- 15 units bid with meals   **Note patient has been transitioned off IV insulin drip this AM to the following: Lantus 30 units daily (received Lantus at 0516 AM) Novolog Sensitive SSI Novolog 4 units tid with meals   Will follow while inpatient.   Wyn Quaker RN, MSN, CDE Diabetes Coordinator Inpatient Diabetes Program Team Pager: [phone] (8a-10p)

## 2014-02-23 NOTE — Progress Notes (Signed)
[facility] PROGRESS NOTE  Mary [name] MOQ:[id number] DOB: 10/23/1970 DOA: 02/22/2014 PCP: Elbert Ewings, FNP  Assessment/Plan: DM with poorly controlled type 1 diabetes mellitus Likely in the setting of poor compliance, further worsened with nausea and vomiting. Multiple hospitalizations for DKA and multiple ED visits. Continue IV hydration. Anion gap Closed with IV and drip. Now transitioned to Lantus and pre-meal aspart insulin. -Last A1c of 13.2. Patient has been following a Database administrator.  Patient has been prescribed Lantus 30 units daily and novolog 70/30 15 units bid. -Currently on Lantus 36 units  daily and pre-meal aspart 6 units 3 times a day . -Appreciate diabetic coordinator followup. We will prescribe insulin supplies upon discharge and have her followup as outpatient. -Continue Neurontin for peripheral neuropathy   Nausea / vomiting and abdominal pain  Secondary to DKA and underlying diabetic gastroparesis. Tolerating diet. Continue IV hydration.   continue when necessary morphine and Reglan Continue PPI  Seizure disorder Continue Depakote  Bipolar disorder Continue risperidone  Diet: diabetic  DVT prophylaxis: sq lovenox  Code Status: Full code Family Communication: None at bedside Disposition Plan: Home possibly tomorrow   Consultants:  None  Procedures:  None  Antibiotics:  None  HPI/Subjective: Patient seen and examined this morning. IV insulin Transitioned to Lantus and his anion gap closed overnight. on discussion patient is irritable and says she did not get enough insulin supplies as outpatient and ran out of her syringe 1 day prior to admission. Says her sugars have been in 200s to 300s at home.   Objective: Filed Vitals:   02/23/14 1414  BP: 142/84  Pulse: 79  Temp: 98.3 F (36.8 C)  Resp: 18    Intake/Output Summary (Last 24 hours) at 02/23/14 1600 Last data filed at 02/23/14 1414  Gross per 24 hour  Intake    2395 ml  Output   1250 ml  Net   1145 ml   There were no vitals filed for this visit.  Exam:   General:  Middle aged female in no acute distress  HEENT: No pallor, moist oral mucosa  Cardiovascular: Normal S1 and S2, no murmurs  Respiratory: Clear to auscultation bilaterally  Abdomen: Soft, nontender, nondistended, bowel sounds present  Musculoskeletal: Warm, no edema  CNS: Alert and oriented  Data Reviewed: Basic Metabolic Panel:  Recent Labs Lab 02/22/14 1747 02/22/14 2155 02/23/14 0150 02/23/14 0552 02/23/14 1146  NA 141 145 148* 144 136*  K 4.2 3.9 4.2 4.0 4.5  CL 101 108 113* 108 105  CO2 19 16* 20 20 19   GLUCOSE 339* 281* 111* 173* 294*  BUN 16 17 18 19 20   CREATININE 0.47* 0.53 0.50 0.47* 0.50  CALCIUM 9.5 9.7 9.1 9.2 9.2   Liver Function Tests:  Recent Labs Lab 02/22/14 0949  AST 50*  ALT 46*  ALKPHOS 101  BILITOT 0.3  PROT 7.8  ALBUMIN 3.9    Recent Labs Lab 02/22/14 0949  LIPASE 30   No results found for this basename: AMMONIA,  in the last 168 hours CBC:  Recent Labs Lab 02/22/14 0949 02/23/14 0552  WBC 14.8* 16.6*  NEUTROABS 11.7*  --   HGB 12.0 12.2  HCT 37.5 38.2  MCV 71.8* 72.6*  PLT 198 166   Cardiac Enzymes:  Recent Labs Lab 02/22/14 0949  TROPONINI <0.30   BNP (last 3 results) No results found for this basename: PROBNP,  in the last 8760 hours CBG:  Recent Labs Lab 02/23/14 0254 02/23/14 0402 02/23/14  0503 02/23/14 0741 02/23/14 1158  GLUCAP 125* 152* 161* 211* 260*    No results found for this or any previous visit (from the past 240 hour(s)).   Studies: No results found.  Scheduled Meds: . enoxaparin (LOVENOX) injection  40 mg Subcutaneous Q24H  . insulin aspart  0-5 Units Subcutaneous QHS  . insulin aspart  0-9 Units Subcutaneous TID WC  . insulin aspart  6 Units Subcutaneous TID WC  . [START ON 02/24/2014] insulin glargine  36 Units Subcutaneous Daily  . metoCLOPramide (REGLAN) injection  5  mg Intravenous 4 times per day  . pantoprazole (PROTONIX) IV  40 mg Intravenous Q12H  . valproate sodium  250 mg Intravenous Q12H   Continuous Infusions: . sodium chloride 100 mL/hr at 02/23/14 0518  . insulin (NOVOLIN-R) infusion 5.1 Units/hr (02/23/14 0508)       Time spent: 25 minutes    Sapna Padron, [place]  [facility] Pager [phone]. If 7PM-7AM, please contact night-coverage at [email or web address], password [facility] 02/23/2014, 4:00 PM  LOS: 1 day

## 2014-02-24 DIAGNOSIS — E876 Hypokalemia: Secondary | ICD-10-CM

## 2014-02-24 LAB — BASIC METABOLIC PANEL
ANION GAP: 13 (ref 5–15)
BUN: 12 mg/dL (ref 6–23)
CO2: 21 mEq/L (ref 19–32)
Calcium: 8.8 mg/dL (ref 8.4–10.5)
Chloride: 103 mEq/L (ref 96–112)
Creatinine, Ser: 0.45 mg/dL — ABNORMAL LOW (ref 0.50–1.10)
GFR calc non Af Amer: >90 mL/min (ref >90)
Glucose, Bld: 242 mg/dL — ABNORMAL HIGH (ref 70–99)
POTASSIUM: 3.6 meq/L — AB (ref 3.7–5.3)
Sodium: 137 mEq/L (ref 137–147)

## 2014-02-24 LAB — CBC
HCT: 34.1 % — ABNORMAL LOW (ref 36.0–46.0)
Hemoglobin: 10.9 g/dL — ABNORMAL LOW (ref 12.0–15.0)
MCH: 23.2 pg — ABNORMAL LOW (ref 26.0–34.0)
MCHC: 32 g/dL (ref 30.0–36.0)
MCV: 72.7 fL — ABNORMAL LOW (ref 78.0–100.0)
PLATELETS: 158 10*3/uL (ref 150–400)
RBC: 4.69 MIL/uL (ref 3.87–5.11)
RDW: 14.3 % (ref 11.5–15.5)
WBC: 12.5 10*3/uL — AB (ref 4.0–10.5)

## 2014-02-24 LAB — GLUCOSE, CAPILLARY
Glucose-Capillary: 235 mg/dL — ABNORMAL HIGH (ref 70–99)
Glucose-Capillary: 177 mg/dL — ABNORMAL HIGH (ref 70–99)

## 2014-02-24 MED ORDER — GLUCOSE BLOOD VI STRP: ORAL_STRIP | Status: DC

## 2014-02-24 MED ORDER — DIVALPROEX SODIUM 250 MG PO DR TAB
250.0000 mg | DELAYED_RELEASE_TABLET | Freq: Two times a day (BID) | ORAL | Status: DC
  Filled 2014-02-24: qty 1

## 2014-02-24 MED ORDER — ACCU-CHEK MULTICLIX LANCETS MISC: Status: DC

## 2014-02-24 MED ORDER — METOCLOPRAMIDE HCL 10 MG PO TABS: 10.0000 mg | ORAL_TABLET | Freq: Four times a day (QID) | ORAL | Status: DC | PRN

## 2014-02-24 MED ORDER — SYRINGE (DISPOSABLE) 1 ML MISC: 1.0000 "application " | Freq: Two times a day (BID) | Status: DC

## 2014-02-24 NOTE — Progress Notes (Signed)
CARE MANAGEMENT NOTE 02/24/2014  Patient:  Mary [name], Mary [name]   Account Number:  [id number]  Date Initiated:  02/24/2014  Documentation initiated by:  Gabriel Earing  Subjective/Objective Assessment:   pt admitted with cco DKA     Action/Plan:   from home   Anticipated DC Date:  02/24/2014   Anticipated DC Plan:  [place]  CM consult      Choice offered to / List presented to:             Status of service:  Completed, signed off Medicare Important Message given?  NA - LOS <3 / Initial given by admissions (If response is "NO", the following Medicare IM given date fields will be blank) Date Medicare IM given:   Medicare IM given by:   Date Additional Medicare IM given:   Additional Medicare IM given by:    Discharge Disposition:  HOME/SELF CARE  Per UR Regulation:  Reviewed for med. necessity/level of care/duration of stay  If discussed at [place] of Stay Meetings, dates discussed:    Comments:  02/24/14 MMcGibboney, RN, BSN Referred pt to [facility].

## 2014-02-24 NOTE — Discharge Summary (Signed)
Physician Discharge Summary  Mary [name] PPJ:[id number] DOB: [birth year] DOA: 02/22/2014  PYK[id number] Admit date: 02/22/2014 Discharge date: 02/24/2014  Time spent: 35 minutes  Recommendations for Outpatient Follow-up:  1. D/C home with outpt follow up at wellness center. High risk for readmission.   Discharge Diagnoses:  Principal Problem:   DKA, type 1, not at goal  Active Problems:   Seizure disorder   Non compliance w medication regimen   Tobacco abuse   Bipolar 1 disorder   Nausea with vomiting   Gastroparesis due to DM   GERD (gastroesophageal reflux disease)   HTN (hypertension)   Protein-calorie malnutrition, severe   DKA (diabetic ketoacidoses)   Discharge Condition: fair  Diet recommendation: diabetic  There were no vitals filed for this visit.  History of present illness:    Hospital Course:  DM with poorly controlled type 1 diabetes mellitus  - in the setting of poor compliance, further worsened with nausea and vomiting. Multiple hospitalizations for DKA and multiple ED visits. . Anion gap Closed with IV fluids and insulin drip. Patient has the same reasoning of running out of supplies  -Last A1c of 13.2. Patient has been following a Database administrator. Patient has been prescribed Lantus 30 units daily and novolog 70/30 15 units bid.  -Appreciate diabetic coordinator followup. Will discharge her on home insulin regimen. Dose need not be adjusted as compliance with medication is the main issue. We will prescribe insulin supplies upon discharge and have her followup as outpatient.  -Continue Neurontin for peripheral neuropathy   Nausea / vomiting and abdominal pain  Secondary to DKA and underlying diabetic gastroparesis. Tolerating diet. abdominal pain resolved. .  continue when necessary  Reglan  Continue PPI   Seizure disorder  Continue Depakote   Bipolar disorder  Continue risperidone   Hypokalemia replenished  Patient  clinically stable. Discussed at length about medication adherence and risks and complications associated with uncontrolled diabetes.   Diet: diabetic   Code Status: Full code  Family Communication: None at bedside  Disposition Plan: Home    Consultants:  None Procedures:  None Antibiotics:  None   Discharge Exam: Filed Vitals:   02/24/14 0518  BP: 136/70  Pulse: 66  Temp: 97.9 F (36.6 C)  Resp: 24     General:  no acute distress  HEENT: moist oral mucosa  Cardiovascular: Normal S1 and S2, no murmurs  Respiratory: Clear to auscultation bilaterally  Abdomen: Soft, nontender, nondistended, bowel sounds present  Musculoskeletal: Warm, no edema  CNS: Alert and oriented  Discharge Instructions You were cared for by a hospitalist during your hospital stay. If you have any questions about your discharge medications or the care you received while you were in the hospital after you are discharged, you can call the unit and asked to speak with the hospitalist on call if the hospitalist that took care of you is not available. Once you are discharged, your primary care physician will handle any further medical issues. Please note that NO REFILLS for any discharge medications will be authorized once you are discharged, as it is imperative that you return to your primary care physician (or establish a relationship with a primary care physician if you do not have one) for your aftercare needs so that they can reassess your need for medications and monitor your lab values.   Current Discharge Medication List    START taking these medications   Details  glucose blood test strip Use as instructed Qty:  100 each, Refills: 12    Lancets (ACCU-CHEK MULTICLIX) lancets Use as instructed Qty: 100 each, Refills: 12    Syringe, Disposable, 1 ML MISC 1 application by Does not apply route 2 (two) times daily. Qty: 200 each, Refills: 12      CONTINUE these medications which have CHANGED    Details  metoCLOPramide (REGLAN) 10 MG tablet Take 1 tablet (10 mg total) by mouth every 6 (six) hours as needed for nausea or vomiting. Qty: 40 tablet, Refills: 0      CONTINUE these medications which have NOT CHANGED   Details  aspirin EC 81 MG tablet Take 1 tablet (81 mg total) by mouth daily. Qty: 30 tablet, Refills: 3   Associated Diagnoses: DM (diabetes mellitus), type 1, uncontrolled    divalproex (DEPAKOTE) 250 MG DR tablet Take 1 tablet (250 mg total) by mouth every 12 (twelve) hours. Qty: 180 tablet, Refills: 3    docusate sodium 100 MG CAPS Take 200 mg by mouth 2 (two) times daily as needed for mild constipation. Qty: 20 capsule, Refills: 0    estradiol cypionate (DEPO-ESTRADIOL) 5 MG/ML injection Inject into the muscle every 28 (twenty-eight) days.    hyoscyamine (LEVSIN SL) 0.125 MG SL tablet Place 1 tablet (0.125 mg total) under the tongue 3 (three) times daily. Qty: 90 tablet, Refills: 3    Insulin Glargine (LANTUS SOLOSTAR) 100 UNIT/ML Solostar Pen Inject 30 Units into the skin daily at 10 pm. Qty: 45 mL, Refills: 3    insulin NPH-regular Human (NOVOLIN 70/30) (70-30) 100 UNIT/ML injection Inject 15 Units into the skin 2 (two) times daily with a meal. Insulin syringes and needles as needed. Qty: 30 mL, Refills: 3   Associated Diagnoses: DM (diabetes mellitus), type 1, uncontrolled    medroxyPROGESTERone (DEPO-PROVERA) 150 MG/ML injection Inject 150 mg into the muscle every 3 (three) months.    pantoprazole (PROTONIX) 40 MG tablet Take 1 tablet (40 mg total) by mouth daily. Switch for any other PPI at similar dose and frequency Qty: 30 tablet, Refills: 3    polyethylene glycol (MIRALAX / GLYCOLAX) packet Take 17 g by mouth daily. Qty: 14 each, Refills: 0    potassium chloride 20 MEQ TBCR Take 20 mEq by mouth daily. Qty: 3 tablet, Refills: 0    risperidone (RISPERDAL) 2 MG tablet Take 1 tablet (2 mg total) by mouth at bedtime. Qty: 90 tablet, Refills: 3      gabapentin (NEURONTIN) 300 MG capsule                                                                       Take 1 capsule 3 times a day  STOP taking these medications           gabapentin (NEURONTIN) 400 MG capsule        Allergies  Allergen Reactions  . Penicillins Rash   Follow-up Information   Follow up with [facility]    . Schedule an appointment as soon as possible for a visit in 1 week.   Contact information:   [place] [id number] [phone]       The results of significant diagnostics from this hospitalization (including imaging, microbiology, ancillary  and laboratory) are listed below for reference.    Significant Diagnostic Studies: No results found.  Microbiology: No results found for this or any previous visit (from the past 240 hour(s)).   Labs: Basic Metabolic Panel:  Recent Labs Lab 02/22/14 2155 02/23/14 0150 02/23/14 0552 02/23/14 1146 02/24/14 0630  NA 145 148* 144 136* 137  K 3.9 4.2 4.0 4.5 3.6*  CL 108 113* 108 105 103  CO2 16* 20 20 19 21   GLUCOSE 281* 111* 173* 294* 242*  BUN 17 18 19 20 12   CREATININE 0.53 0.50 0.47* 0.50 0.45*  CALCIUM 9.7 9.1 9.2 9.2 8.8   Liver Function Tests:  Recent Labs Lab 02/22/14 0949  AST 50*  ALT 46*  ALKPHOS 101  BILITOT 0.3  PROT 7.8  ALBUMIN 3.9    Recent Labs Lab 02/22/14 0949  LIPASE 30   No results found for this basename: AMMONIA,  in the last 168 hours CBC:  Recent Labs Lab 02/22/14 0949 02/23/14 0552 02/24/14 0630  WBC 14.8* 16.6* 12.5*  NEUTROABS 11.7*  --   --   HGB 12.0 12.2 10.9*  HCT 37.5 38.2 34.1*  MCV 71.8* 72.6* 72.7*  PLT 198 166 158   Cardiac Enzymes:  Recent Labs Lab 02/22/14 0949  TROPONINI <0.30   BNP: BNP (last 3 results) No results found for this basename: PROBNP,  in the last 8760 hours CBG:  Recent Labs Lab 02/23/14 1158 02/23/14 1612 02/23/14 2200 02/24/14 0734 02/24/14 1142   GLUCAP 260* 174* 212* 235* 177*       Signed:  Bria Sparr  [facility] 02/24/2014, 2:14 PM

## 2014-02-24 NOTE — Progress Notes (Signed)
This patient is receiving IV Valproate 250 mg IV q12h. Based on criteria approved by the Pharmacy and Therapeutics Committee, this medication is being converted to the equivalent oral dose form. These criteria include:   . The patient is eating (either orally or per tube) and/or has been taking other orally administered medications for at least 24 hours.  . This patient has no evidence of active gastrointestinal bleeding or impaired GI absorption (gastrectomy, short bowel, patient on TNA or NPO).   If you have questions about this conversion, please contact the pharmacy department.  Hershal Coria, [facility] 02/24/2014 10:24 AM

## 2014-02-28 ENCOUNTER — Ambulatory Visit: Payer: Self-pay | Attending: Internal Medicine

## 2014-03-02 NOTE — ED Provider Notes (Signed)
Medical screening examination/treatment/procedure(s) were conducted as a shared visit with non-physician practitioner(s) and myself.  I personally evaluated the patient during the encounter.   EKG Interpretation   Date/Time:  Tuesday February 22 2014 09:52:47 EDT Ventricular Rate:  70 PR Interval:  120 QRS Duration: 71 QT Interval:  439 QTC Calculation: 474 R Axis:   73 Text Interpretation:  Sinus rhythm Probable left atrial enlargement RSR'  in V1 or V2, probably normal variant Borderline T abnormalities, anterior  leads Baseline wander in lead(s) V3 V4 V5 V6 ED PHYSICIAN INTERPRETATION  AVAILABLE IN [facility] Confirmed by TEST, Record ([id number]) on  02/24/2014 7:16:29 AM      Pt seen and evaluated.  Lucid, mentating well.  C/O vomiting "all day".  Hyperglycemic, DKA per labs.  IV placed-see note.  Admitted.  Tanna Furry, MD 03/02/14 [phone]

## 2014-03-08 ENCOUNTER — Encounter (HOSPITAL_COMMUNITY): Payer: Self-pay | Admitting: Emergency Medicine

## 2014-03-08 ENCOUNTER — Emergency Department (HOSPITAL_COMMUNITY): Payer: Medicaid Other

## 2014-03-08 ENCOUNTER — Inpatient Hospital Stay (HOSPITAL_COMMUNITY)
Admission: EM | Admit: 2014-03-08 | Discharge: 2014-03-10 | DRG: 073 | Disposition: A | Discharge status: Home / Self Care | Payer: Medicaid Other | Attending: Internal Medicine | Admitting: Internal Medicine

## 2014-03-08 DIAGNOSIS — IMO0002 Reserved for concepts with insufficient information to code with codable children: Secondary | ICD-10-CM | POA: Diagnosis not present

## 2014-03-08 DIAGNOSIS — E1010 Type 1 diabetes mellitus with ketoacidosis without coma: Secondary | ICD-10-CM

## 2014-03-08 DIAGNOSIS — G8929 Other chronic pain: Secondary | ICD-10-CM

## 2014-03-08 DIAGNOSIS — Z79899 Other long term (current) drug therapy: Secondary | ICD-10-CM | POA: Diagnosis not present

## 2014-03-08 DIAGNOSIS — E860 Dehydration: Secondary | ICD-10-CM | POA: Diagnosis present

## 2014-03-08 DIAGNOSIS — Z7982 Long term (current) use of aspirin: Secondary | ICD-10-CM | POA: Diagnosis not present

## 2014-03-08 DIAGNOSIS — F17200 Nicotine dependence, unspecified, uncomplicated: Secondary | ICD-10-CM | POA: Diagnosis present

## 2014-03-08 DIAGNOSIS — Z794 Long term (current) use of insulin: Secondary | ICD-10-CM

## 2014-03-08 DIAGNOSIS — D509 Iron deficiency anemia, unspecified: Secondary | ICD-10-CM | POA: Diagnosis present

## 2014-03-08 DIAGNOSIS — D72829 Elevated white blood cell count, unspecified: Secondary | ICD-10-CM | POA: Diagnosis present

## 2014-03-08 DIAGNOSIS — E43 Unspecified severe protein-calorie malnutrition: Secondary | ICD-10-CM | POA: Diagnosis present

## 2014-03-08 DIAGNOSIS — G40909 Epilepsy, unspecified, not intractable, without status epilepticus: Secondary | ICD-10-CM | POA: Diagnosis present

## 2014-03-08 DIAGNOSIS — I10 Essential (primary) hypertension: Secondary | ICD-10-CM | POA: Diagnosis present

## 2014-03-08 DIAGNOSIS — E1049 Type 1 diabetes mellitus with other diabetic neurological complication: Secondary | ICD-10-CM | POA: Diagnosis present

## 2014-03-08 DIAGNOSIS — Z91199 Patient's noncompliance with other medical treatment and regimen due to unspecified reason: Secondary | ICD-10-CM

## 2014-03-08 DIAGNOSIS — R1084 Generalized abdominal pain: Secondary | ICD-10-CM | POA: Diagnosis not present

## 2014-03-08 DIAGNOSIS — B1920 Unspecified viral hepatitis C without hepatic coma: Secondary | ICD-10-CM | POA: Diagnosis present

## 2014-03-08 DIAGNOSIS — E1065 Type 1 diabetes mellitus with hyperglycemia: Principal | ICD-10-CM | POA: Diagnosis present

## 2014-03-08 DIAGNOSIS — E1149 Type 2 diabetes mellitus with other diabetic neurological complication: Secondary | ICD-10-CM | POA: Diagnosis not present

## 2014-03-08 DIAGNOSIS — Z9114 Patient's other noncompliance with medication regimen: Secondary | ICD-10-CM

## 2014-03-08 DIAGNOSIS — K3184 Gastroparesis: Secondary | ICD-10-CM | POA: Diagnosis present

## 2014-03-08 DIAGNOSIS — H269 Unspecified cataract: Secondary | ICD-10-CM | POA: Diagnosis present

## 2014-03-08 DIAGNOSIS — F3189 Other bipolar disorder: Secondary | ICD-10-CM | POA: Diagnosis present

## 2014-03-08 DIAGNOSIS — R112 Nausea with vomiting, unspecified: Secondary | ICD-10-CM | POA: Diagnosis present

## 2014-03-08 DIAGNOSIS — R7309 Other abnormal glucose: Secondary | ICD-10-CM | POA: Diagnosis not present

## 2014-03-08 DIAGNOSIS — F319 Bipolar disorder, unspecified: Secondary | ICD-10-CM | POA: Diagnosis not present

## 2014-03-08 DIAGNOSIS — R739 Hyperglycemia, unspecified: Secondary | ICD-10-CM

## 2014-03-08 DIAGNOSIS — K7460 Unspecified cirrhosis of liver: Secondary | ICD-10-CM | POA: Diagnosis present

## 2014-03-08 DIAGNOSIS — R109 Unspecified abdominal pain: Secondary | ICD-10-CM | POA: Diagnosis present

## 2014-03-08 DIAGNOSIS — E1142 Type 2 diabetes mellitus with diabetic polyneuropathy: Secondary | ICD-10-CM | POA: Diagnosis present

## 2014-03-08 DIAGNOSIS — E876 Hypokalemia: Secondary | ICD-10-CM

## 2014-03-08 DIAGNOSIS — Z720 Tobacco use: Secondary | ICD-10-CM

## 2014-03-08 DIAGNOSIS — E1143 Type 2 diabetes mellitus with diabetic autonomic (poly)neuropathy: Secondary | ICD-10-CM | POA: Diagnosis present

## 2014-03-08 DIAGNOSIS — Z9119 Patient's noncompliance with other medical treatment and regimen: Secondary | ICD-10-CM

## 2014-03-08 LAB — COMPREHENSIVE METABOLIC PANEL
ALK PHOS: 107 U/L (ref 39–117)
ALT: 50 U/L — ABNORMAL HIGH (ref 0–35)
ANION GAP: 22 — AB (ref 5–15)
AST: 46 U/L — ABNORMAL HIGH (ref 0–37)
Albumin: 4.6 g/dL (ref 3.5–5.2)
BILIRUBIN TOTAL: 0.4 mg/dL (ref 0.3–1.2)
BUN: 34 mg/dL — AB (ref 6–23)
CHLORIDE: 97 meq/L (ref 96–112)
CO2: 21 mEq/L (ref 19–32)
Calcium: 10.6 mg/dL — ABNORMAL HIGH (ref 8.4–10.5)
Creatinine, Ser: 0.67 mg/dL (ref 0.50–1.10)
GFR calc non Af Amer: >90 mL/min (ref >90)
GLUCOSE: 472 mg/dL — AB (ref 70–99)
Potassium: 4.2 mEq/L (ref 3.7–5.3)
Sodium: 140 mEq/L (ref 137–147)
TOTAL PROTEIN: 9.1 g/dL — AB (ref 6.0–8.3)

## 2014-03-08 LAB — BLOOD GAS, VENOUS
ACID-BASE DEFICIT: 1 mmol/L (ref 0.0–2.0)
Bicarbonate: 23 mEq/L (ref 20.0–24.0)
O2 Saturation: 64.5 %
PATIENT TEMPERATURE: 98.6
TCO2: 20.3 mmol/L (ref 0–100)
pCO2, Ven: 37.9 mmHg — ABNORMAL LOW (ref 45.0–50.0)
pH, Ven: 7.399 — ABNORMAL HIGH (ref 7.250–7.300)

## 2014-03-08 LAB — I-STAT CG4 LACTIC ACID, ED: Lactic Acid, Venous: 3.59 mmol/L — ABNORMAL HIGH (ref 0.5–2.2)

## 2014-03-08 LAB — URINALYSIS, ROUTINE W REFLEX MICROSCOPIC
Bilirubin Urine: NEGATIVE
Glucose, UA: >1000 mg/dL — AB
HGB URINE DIPSTICK: NEGATIVE
Ketones, ur: 15 mg/dL — AB
Leukocytes, UA: NEGATIVE
Nitrite: NEGATIVE
PROTEIN: NEGATIVE mg/dL
Specific Gravity, Urine: 1.038 — ABNORMAL HIGH (ref 1.005–1.030)
Urobilinogen, UA: 0.2 mg/dL (ref 0.0–1.0)
pH: 5.5 (ref 5.0–8.0)

## 2014-03-08 LAB — I-STAT TROPONIN, ED: Troponin i, poc: 0 ng/mL (ref 0.00–0.08)

## 2014-03-08 LAB — CBC
HEMATOCRIT: 42.4 % (ref 36.0–46.0)
HEMOGLOBIN: 13.7 g/dL (ref 12.0–15.0)
MCH: 23 pg — ABNORMAL LOW (ref 26.0–34.0)
MCHC: 32.3 g/dL (ref 30.0–36.0)
MCV: 71.3 fL — ABNORMAL LOW (ref 78.0–100.0)
Platelets: 236 10*3/uL (ref 150–400)
RBC: 5.95 MIL/uL — ABNORMAL HIGH (ref 3.87–5.11)
RDW: 14.3 % (ref 11.5–15.5)
WBC: 18.2 10*3/uL — ABNORMAL HIGH (ref 4.0–10.5)

## 2014-03-08 LAB — GLUCOSE, CAPILLARY
Glucose-Capillary: 145 mg/dL — ABNORMAL HIGH (ref 70–99)
Glucose-Capillary: 75 mg/dL (ref 70–99)

## 2014-03-08 LAB — URINE MICROSCOPIC-ADD ON

## 2014-03-08 MED ORDER — ASPIRIN EC 81 MG PO TBEC
81.0000 mg | DELAYED_RELEASE_TABLET | Freq: Every day | ORAL | Status: DC
  Administered 2014-03-08 – 2014-03-10 (×3): 81 mg via ORAL
  Filled 2014-03-08 (×3): qty 1

## 2014-03-08 MED ORDER — MORPHINE SULFATE 4 MG/ML IJ SOLN
4.0000 mg | Freq: Once | INTRAMUSCULAR | Status: AC
  Administered 2014-03-08: 4 mg via INTRAVENOUS
  Filled 2014-03-08: qty 1

## 2014-03-08 MED ORDER — INSULIN ASPART 100 UNIT/ML ~~LOC~~ SOLN
0.0000 [IU] | Freq: Three times a day (TID) | SUBCUTANEOUS | Status: DC
  Administered 2014-03-08: 2 [IU] via SUBCUTANEOUS

## 2014-03-08 MED ORDER — SODIUM CHLORIDE 0.9 % IJ SOLN: 3.0000 mL | Freq: Two times a day (BID) | INTRAMUSCULAR | Status: DC

## 2014-03-08 MED ORDER — HYDROMORPHONE HCL 1 MG/ML IJ SOLN
1.0000 mg | Freq: Once | INTRAMUSCULAR | Status: AC
  Administered 2014-03-08: 1 mg via INTRAVENOUS
  Filled 2014-03-08: qty 1

## 2014-03-08 MED ORDER — ONDANSETRON HCL 4 MG PO TABS
4.0000 mg | ORAL_TABLET | Freq: Four times a day (QID) | ORAL | Status: DC | PRN
  Administered 2014-03-09 (×2): 4 mg via ORAL
  Filled 2014-03-08 (×2): qty 1

## 2014-03-08 MED ORDER — POLYETHYLENE GLYCOL 3350 17 G PO PACK
17.0000 g | PACK | Freq: Every day | ORAL | Status: DC | PRN
  Filled 2014-03-08: qty 1

## 2014-03-08 MED ORDER — PANTOPRAZOLE SODIUM 40 MG PO TBEC
40.0000 mg | DELAYED_RELEASE_TABLET | Freq: Every day | ORAL | Status: DC
  Administered 2014-03-09 – 2014-03-10 (×2): 40 mg via ORAL
  Filled 2014-03-08 (×2): qty 1

## 2014-03-08 MED ORDER — IOHEXOL 300 MG/ML  SOLN
100.0000 mL | Freq: Once | INTRAMUSCULAR | Status: AC | PRN
  Administered 2014-03-08: 100 mL via INTRAVENOUS

## 2014-03-08 MED ORDER — RISPERIDONE 2 MG PO TABS
2.0000 mg | ORAL_TABLET | Freq: Every day | ORAL | Status: DC
  Administered 2014-03-08 – 2014-03-09 (×2): 2 mg via ORAL
  Filled 2014-03-08 (×3): qty 1

## 2014-03-08 MED ORDER — DEXTROSE 5 % IV SOLN: 2.0000 g | Freq: Once | INTRAVENOUS | Status: DC

## 2014-03-08 MED ORDER — ENOXAPARIN SODIUM 40 MG/0.4ML ~~LOC~~ SOLN
40.0000 mg | SUBCUTANEOUS | Status: DC
  Administered 2014-03-08 – 2014-03-09 (×2): 40 mg via SUBCUTANEOUS
  Filled 2014-03-08 (×3): qty 0.4

## 2014-03-08 MED ORDER — GABAPENTIN 300 MG PO CAPS
300.0000 mg | ORAL_CAPSULE | Freq: Three times a day (TID) | ORAL | Status: DC
  Administered 2014-03-08 – 2014-03-10 (×6): 300 mg via ORAL
  Filled 2014-03-08 (×8): qty 1

## 2014-03-08 MED ORDER — HYDROCODONE-ACETAMINOPHEN 5-325 MG PO TABS
1.0000 | ORAL_TABLET | ORAL | Status: DC | PRN
  Administered 2014-03-08 – 2014-03-09 (×4): 2 via ORAL
  Filled 2014-03-08 (×4): qty 2

## 2014-03-08 MED ORDER — MEDROXYPROGESTERONE ACETATE 150 MG/ML IM SUSP: 150.0000 mg | INTRAMUSCULAR | Status: DC

## 2014-03-08 MED ORDER — POTASSIUM CHLORIDE CRYS ER 20 MEQ PO TBCR
20.0000 meq | EXTENDED_RELEASE_TABLET | Freq: Every day | ORAL | Status: DC
  Administered 2014-03-08: 20 meq via ORAL
  Filled 2014-03-08 (×2): qty 1

## 2014-03-08 MED ORDER — ONDANSETRON HCL 4 MG/2ML IJ SOLN
4.0000 mg | Freq: Four times a day (QID) | INTRAMUSCULAR | Status: DC
  Administered 2014-03-08 – 2014-03-10 (×8): 4 mg via INTRAVENOUS
  Filled 2014-03-08 (×6): qty 2

## 2014-03-08 MED ORDER — INSULIN ASPART PROT & ASPART (70-30 MIX) 100 UNIT/ML ~~LOC~~ SUSP
15.0000 [IU] | Freq: Two times a day (BID) | SUBCUTANEOUS | Status: DC
  Filled 2014-03-08: qty 10

## 2014-03-08 MED ORDER — METOCLOPRAMIDE HCL 10 MG PO TABS
10.0000 mg | ORAL_TABLET | Freq: Three times a day (TID) | ORAL | Status: DC
  Administered 2014-03-08 – 2014-03-10 (×7): 10 mg via ORAL
  Filled 2014-03-08 (×10): qty 1

## 2014-03-08 MED ORDER — AMIODARONE HCL IN DEXTROSE 360-4.14 MG/200ML-% IV SOLN: 30.0000 mg/h | INTRAVENOUS | Status: DC

## 2014-03-08 MED ORDER — SODIUM CHLORIDE 0.9 % IV SOLN
INTRAVENOUS | Status: DC
  Administered 2014-03-08: 100 mL/h via INTRAVENOUS
  Administered 2014-03-09: 03:00:00 via INTRAVENOUS

## 2014-03-08 MED ORDER — DIVALPROEX SODIUM 250 MG PO DR TAB
250.0000 mg | DELAYED_RELEASE_TABLET | Freq: Two times a day (BID) | ORAL | Status: DC
  Administered 2014-03-08 – 2014-03-10 (×5): 250 mg via ORAL
  Filled 2014-03-08 (×6): qty 1

## 2014-03-08 MED ORDER — AMIODARONE HCL IN DEXTROSE 360-4.14 MG/200ML-% IV SOLN: 60.0000 mg/h | INTRAVENOUS | Status: DC

## 2014-03-08 MED ORDER — DOCUSATE SODIUM 100 MG PO CAPS
200.0000 mg | ORAL_CAPSULE | Freq: Every day | ORAL | Status: DC | PRN
  Filled 2014-03-08: qty 2

## 2014-03-08 MED ORDER — SODIUM CHLORIDE 0.9 % IV BOLUS (SEPSIS)
1000.0000 mL | Freq: Once | INTRAVENOUS | Status: AC
  Administered 2014-03-08: 1000 mL via INTRAVENOUS

## 2014-03-08 MED ORDER — ONDANSETRON HCL 4 MG/2ML IJ SOLN
4.0000 mg | Freq: Once | INTRAMUSCULAR | Status: AC
  Administered 2014-03-08: 4 mg via INTRAVENOUS
  Filled 2014-03-08: qty 2

## 2014-03-08 MED ORDER — METOCLOPRAMIDE HCL 10 MG PO TABS: 10.0000 mg | ORAL_TABLET | Freq: Four times a day (QID) | ORAL | Status: DC | PRN

## 2014-03-08 MED ORDER — VANCOMYCIN HCL IN DEXTROSE 1-5 GM/200ML-% IV SOLN: 1000.0000 mg | Freq: Once | INTRAVENOUS | Status: DC

## 2014-03-08 MED ORDER — ONDANSETRON HCL 4 MG/2ML IJ SOLN
4.0000 mg | Freq: Four times a day (QID) | INTRAMUSCULAR | Status: DC | PRN
  Administered 2014-03-08: 4 mg via INTRAVENOUS
  Filled 2014-03-08: qty 2

## 2014-03-08 MED ORDER — AMIODARONE LOAD VIA INFUSION
150.0000 mg | Freq: Once | INTRAVENOUS | Status: DC
  Filled 2014-03-08: qty 83.34

## 2014-03-08 MED ORDER — HYOSCYAMINE SULFATE 0.125 MG SL SUBL
0.1250 mg | SUBLINGUAL_TABLET | SUBLINGUAL | Status: DC | PRN
  Filled 2014-03-08: qty 1

## 2014-03-08 NOTE — H&P (Signed)
[facility] History and Physical  Mary [name] EPP:[id number] DOB: 17-Oct-1970 DOA: 03/08/2014  Referring physician: ED physician PCP: Angelica Chessman, MD   Chief Complaint: abd pain, nausea and vomiting   HPI:  43 y.o. 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 Patient is not a good historian. Although she is alert, she is not forthcoming regarding her history. She says she is too tired to talk and wants to rest. Reports taking insulin as prescribed but when asked how much she does not answer.   In ED, VSS, CBG is in 400 but pt not in DKA. [facility] asked to admit to telemetry bed.   Assessment and Plan: Active Problems: Abdominal pain, nausea and vomiting - most likely secondary to gastroparesis from uncontrolled DM - place on Reglan, added antiemetics scheduled and as needed - keep NPO for now and advance diet as pt able to tolerate  Uncontrolled DM with complications of gastroparesis, neuropathy  - last A1C > 13, 2 months ago - pt non compliant with medical regimen - continue SSI for now - as pt is NPO will hold odd on Lantus  - pt on Lantus and Insulin 70/30 at home - would simplify the regimen to lantus only if possible to help with compliance  - continue neurontin  Transaminitis - from hep C Leukocytosis  - unclear etiology - pt is afebrile, UA unremarkable, no specific respiratory symptoms - will hold off on ABX for now - repeat CBC in AM Severe PCM - from uncontrolled DM - NPO for now  Seizures - continue home medical regimen   Radiological Exams on Admission: Ct  Abdomen Pelvis W Contrast  03/08/2014  No significant abnormality seen in the abdomen or pelvis.    Code Status: Full Family Communication: Pt at bedside Disposition Plan: Admit for further evaluation    Review of Systems:  Pt tired and not offering much information    Past Medical History  Diagnosis Date  . Kidney stone   . Seizure disorder     started with pregnancy of first son  . Gastritis   . Bipolar 2 disorder   . Post traumatic stress disorder     "flipping out" after people close to her died  . Heart murmur   . Arthritis     r ankle-S/P surgery (2007)  . Anemia     childhood  . Depression     childhood  . Hepatitis C     biopsy in 2010-no rx-was supposed to see a hepatologist in [place].  With cirrhosis  . Neuropathy     in legs and feet and hands  . Cataracts, bilateral   . Scoliosis   . Diabetes mellitus     diagnosed in 1996-always been on insulin  . DKA (diabetic ketoacidoses)     Recurrent admissions for DKA, medication non-complaince, poor social situation  . Diabetic neuropathy, type I diabetes mellitus     numbness bilaterally feet  . HTN (hypertension) 10/12/2013    Past Surgical History  Procedure Laterality Date  . Ankle surgery  2007  . Endometrial biopsy  06/22/2012  . Cholecystectomy  04/07/2013  . Cholecystectomy  N/A 04/07/2013    Procedure: LAPAROSCOPIC CHOLECYSTECTOMY WITH INTRAOPERATIVE CHOLANGIOGRAM;  Surgeon: Adin Hector, MD;  Location: [facility] ORS;  Service: General;  Laterality: N/A;  . Cesarean section      3 times    Social History:  reports that she has been smoking Cigarettes.  She has a 2.5 pack-year smoking history. She has never used smokeless tobacco. She reports that she uses illicit drugs (Marijuana) about once per week. She reports that she does not drink alcohol.  Allergies  Allergen Reactions  . Penicillins Rash    Family History  Problem Relation Age of Onset  . Diabetes Mother     currently 82  . Fibromyalgia  Mother   . Cirrhosis Father     died in 58  . Diabetes Maternal Grandmother   . Diabetes Maternal Aunt     Prior to Admission medications   Medication Sig Start Date End Date Taking? Authorizing Provider  aspirin EC 81 MG tablet Take 81 mg by mouth daily.   Yes Historical Provider, MD  divalproex (DEPAKOTE) 250 MG DR tablet Take 250 mg by mouth 2 times daily at 12 noon and 4 pm.   Yes Historical Provider, MD  docusate sodium (COLACE) 100 MG capsule Take 200 mg by mouth daily as needed for mild constipation.   Yes Historical Provider, MD  estradiol cypionate (DEPO-ESTRADIOL) 5 MG/ML injection Inject into the muscle every 28 (twenty-eight) days.   Yes Historical Provider, MD  gabapentin (NEURONTIN) 300 MG capsule Take 300 mg by mouth 3 (three) times daily.   Yes Historical Provider, MD  hyoscyamine (LEVSIN SL) 0.125 MG SL tablet Place 0.125 mg under the tongue every 4 (four) hours as needed for cramping.   Yes Historical Provider, MD  Insulin Glargine (LANTUS SOLOSTAR) 100 UNIT/ML Solostar Pen Inject 30 Units into the skin daily at 10 pm.   Yes Historical Provider, MD  insulin NPH-regular Human (NOVOLIN 70/30) (70-30) 100 UNIT/ML injection Inject 15 Units into the skin 2 (two) times daily with a meal.   Yes Historical Provider, MD  medroxyPROGESTERone (DEPO-PROVERA) 150 MG/ML injection Inject 150 mg into the muscle every 3 (three) months.   Yes Historical Provider, MD  metoCLOPramide (REGLAN) 10 MG tablet Take 10 mg by mouth every 6 (six) hours as needed for nausea.   Yes Historical Provider, MD  pantoprazole (PROTONIX) 40 MG tablet Take 40 mg by mouth daily.   Yes Historical Provider, MD  polyethylene glycol (MIRALAX / GLYCOLAX) packet Take 17 g by mouth daily as needed for mild constipation.   Yes Historical Provider, MD  potassium chloride SA (K-DUR,KLOR-CON) 20 MEQ tablet Take 20 mEq by mouth daily.   Yes Historical Provider, MD  risperiDONE (RISPERDAL) 2 MG tablet Take 2 mg by mouth at  bedtime.   Yes Historical Provider, MD    Physical Exam: Filed Vitals:   03/08/14 9323 03/08/14 0923 03/08/14 1043  BP: 139/84  146/81  Pulse: 85  79  Temp: 97.9 F (36.6 C)    TempSrc: Oral    Resp: 23  16  SpO2: 100% 98% 100%    Physical Exam  Constitutional: Appears tired, minimally verbal. NAD HENT: Normocephalic. External right and left ear normal. Dry MM Eyes: Conjunctivae and EOM are normal. PERRLA, no scleral icterus.  Neck: Normal ROM. Neck supple. No JVD. No tracheal deviation. No thyromegaly.  CVS: RRR, S1/S2 +, no murmurs, no gallops, no carotid bruit.  Pulmonary: Effort and breath sounds normal, no stridor, diminished breath  sounds at bases Abdominal: Soft. BS +,  no distension, tenderness in abd area, no rebound or guarding.  Musculoskeletal: Normal range of motion. No edema and no tenderness.  Lymphadenopathy: No lymphadenopathy noted, cervical, inguinal. Neuro: Alert. Non focal  Skin: Skin is warm and dry. No rash noted. Not diaphoretic. No erythema. No pallor.  Psychiatric: Difficult to assess as pt minimal forthcoming   Labs on Admission:  Basic Metabolic Panel:  Recent Labs Lab 03/08/14 1012  NA 140  K 4.2  CL 97  CO2 21  GLUCOSE 472*  BUN 34*  CREATININE 0.67  CALCIUM 10.6*   Liver Function Tests:  Recent Labs Lab 03/08/14 1012  AST 46*  ALT 50*  ALKPHOS 107  BILITOT 0.4  PROT 9.1*  ALBUMIN 4.6   CBC:  Recent Labs Lab 03/08/14 1012  WBC 18.2*  HGB 13.7  HCT 42.4  MCV 71.3*  PLT 236   EKG: Normal sinus rhythm, no ST/T wave changes  Faye Ramsay, MD  [facility] Pager [phone]  If 7PM-7AM, please contact night-coverage [email or web address] Password [email or web address] 03/08/2014, 12:00 PM

## 2014-03-08 NOTE — ED Notes (Signed)
Per [facility] Pt presents with NAD. Pt with HX of the same. Pt presents with active vomiting. No vomiting with EMS. CBG 397. C/o of stomach pain generalized. Cousin gave insulin 15 units SQ "levemir". 15 units SQ "Novalog" 0800. Without food.

## 2014-03-08 NOTE — Progress Notes (Signed)
Inpatient Diabetes Program Recommendations  AACE/ADA: New Consensus Statement on Inpatient Glycemic Control (2013)  Target Ranges:  Prepandial:   less than 140 mg/dL      Peak postprandial:   less than 180 mg/dL (1-2 hours)      Critically ill patients:  140 - 180 mg/dL   Patient admitted with Hyperglycemia (Anion gap 22 on admission). Patient well known to the Hospitalists and the Inpatient DM Program. Multiple ED visits 9th admission in past 6 mo, Noncompliance with insulin, etc. Has been counseled extensively in the past regarding the importance of blood sugar control at home.  Noted Patient's Home insulin regimen is as follows:  Lantus 30 units QHS  70/30 insulin- 15 units bid with meals  Per record review, note that Dr, Cruzita Lederer (on 12/03/13) and Dr. Candiss Norse (on 12/25/13) discharged patient home on 70/30 insulin 35 units bid with meals + Metformin 1000 mg bid due to patient's inability to afford Lantus anymore at home (see Dr. Arman Filter note below):  Per Dr. Cruzita Lederer from Discharge Summary on 12/03/13:  "She has been on Lantus in the past however can no longer afford that. I called her PCP's office and discussed with [facility] nurse who has mentioned that all the patient needs to do is come to her appointments, and she will try to set up again financial help with her home insulin. For now I will not change her home regimen since I don't think she will be able to afford a different insulin. Her home regimen is likely to work well I suspect that she is non compliant with insulin administration as she admitted to miss a dose just prior to coming to the hospital. Unfortunately she is at high risk for readmissions in the future."  Upon further investigation, note that patient went to establish care at the [facility] and [facility] on 01/27/14. Per note from [name], NP at the [facility] and [facility], patient was given instructions to take the following insulin regimen:   Lantus 30 units daily  70/30 insulin- 15 units bid with meals  Patient went to establish care at the [facility] and [facility] on 01/27/14. Per note from [name], NP at the [facility] and [facility], patient was given instructions to take the following insulin regimen:  Lantus 30 units daily  70/30 insulin- 15 units bid with meals  Results for PARTICIA, STRAHM (MRN [id number]) as of 03/08/2014 15:25  Ref. Range 03/08/2014 10:12  Sodium Latest Range: 137-147 mEq/L 140  Potassium Latest Range: 3.7-5.3 mEq/L 4.2  Chloride Latest Range: 96-112 mEq/L 97  CO2 Latest Range: 19-32 mEq/L 21  BUN Latest Range: 6-23 mg/dL 34 (H)  Creatinine Latest Range: 0.50-1.10 mg/dL 0.67  Calcium Latest Range: 8.4-10.5 mg/dL 10.6 (H)  GFR calc non Af Amer Latest Range: >90 mL/min >90  GFR calc Af Amer Latest Range: >90 mL/min >90  Glucose Latest Range: 70-99 mg/dL 472 (H)  Anion gap Latest Range: 5-15  22 (H)   Results for BAYA, LENTZ (MRN [id number]) as of 03/08/2014 15:25  Ref. Range 03/08/2014 11:08  Ketones, ur Latest Range: NEGATIVE mg/dL 15 (A)     Inpatient Diabetes Program Recommendations Insulin - IV drip/GlucoStabilizer: IV insulin / GlucoStabilizer for DKA HgbA1C: 13.2% - uncontrolled Diet: NPO  Note: Will follow. Thank you. Lorenda Peck, RD, LDN, CDE Inpatient Diabetes Coordinator [phone]

## 2014-03-08 NOTE — ED Provider Notes (Signed)
CSN: [id number]     Arrival date & time 03/08/14  4967 History   First MD Initiated Contact with Patient 03/08/14 [id number]     Chief Complaint  Patient presents with  . Hyperglycemia  . Emesis  . Abdominal Pain     (Consider location/radiation/quality/duration/timing/severity/associated sxs/prior Treatment) Patient is a 43 y.o. female presenting with hyperglycemia, vomiting, and abdominal pain. The history is provided by the patient.  Hyperglycemia Blood sugar level PTA:  400 Severity:  Mild Onset quality:  Gradual Duration:  3 days Timing:  Constant Progression:  Worsening Chronicity:  New Diabetes status:  Controlled with insulin Current diabetic therapy:  Levemir, Novolog Context: not noncompliance and not recent illness   Relieved by:  Nothing Ineffective treatments:  None tried Associated symptoms: abdominal pain and vomiting   Associated symptoms: no fever and no shortness of breath   Emesis Associated symptoms: abdominal pain and diarrhea   Associated symptoms: no chills   Abdominal Pain Associated symptoms: diarrhea and vomiting   Associated symptoms: no chills, no cough, no fever and no shortness of breath     Past Medical History  Diagnosis Date  . Kidney stone   . Seizure disorder     started with pregnancy of first son  . Gastritis   . Bipolar 2 disorder   . Post traumatic stress disorder     "flipping out" after people close to her died  . Heart murmur   . Arthritis     r ankle-S/P surgery (2007)  . Anemia     childhood  . Depression     childhood  . Hepatitis C     biopsy in 2010-no rx-was supposed to see a hepatologist in [place].  With cirrhosis  . Neuropathy     in legs and feet and hands  . Cataracts, bilateral   . Scoliosis   . Diabetes mellitus     diagnosed in 1996-always been on insulin  . DKA (diabetic ketoacidoses)     Recurrent admissions for DKA, medication non-complaince, poor social situation  . Diabetic neuropathy, type I  diabetes mellitus     numbness bilaterally feet  . HTN (hypertension) 10/12/2013   Past Surgical History  Procedure Laterality Date  . Ankle surgery  2007  . Endometrial biopsy  06/22/2012  . Cholecystectomy  04/07/2013  . Cholecystectomy N/A 04/07/2013    Procedure: LAPAROSCOPIC CHOLECYSTECTOMY WITH INTRAOPERATIVE CHOLANGIOGRAM;  Surgeon: Adin Hector, MD;  Location: [facility];  Service: General;  Laterality: N/A;  . Cesarean section      3 times   Family History  Problem Relation Age of Onset  . Diabetes Mother     currently 16  . Fibromyalgia Mother   . Cirrhosis Father     died in 18  . Diabetes Maternal Grandmother   . Diabetes Maternal Aunt    History  Substance Use Topics  . Smoking status: Current Every Day Smoker -- 0.10 packs/day for 25 years    Types: Cigarettes  . Smokeless tobacco: Never Used  . Alcohol Use: No     Comment: Endorses hasn't been drinking   OB History   Grav Para Term Preterm Abortions TAB SAB Ect Mult Living   4 3 3  0 1 0 1 0 1 2     Review of Systems  Constitutional: Negative for fever and chills.  Respiratory: Negative for cough and shortness of breath.   Gastrointestinal: Positive for vomiting, abdominal pain and diarrhea.  All other systems reviewed and  are negative.     Allergies  Penicillins  Home Medications   Prior to Admission medications   Medication Sig Start Date End Date Taking? Authorizing Provider  aspirin EC 81 MG tablet Take 1 tablet (81 mg total) by mouth daily. 01/27/14   Lance Bosch, NP  divalproex (DEPAKOTE) 250 MG DR tablet Take 1 tablet (250 mg total) by mouth every 12 (twelve) hours. 02/18/14   Tresa Garter, MD  docusate sodium 100 MG CAPS Take 200 mg by mouth 2 (two) times daily as needed for mild constipation. 12/25/13   Thurnell Lose, MD  estradiol cypionate (DEPO-ESTRADIOL) 5 MG/ML injection Inject into the muscle every 28 (twenty-eight) days.    Historical Provider, MD  gabapentin (NEURONTIN)  300 MG capsule Take 1 capsule (300 mg total) by mouth 3 (three) times daily. 01/27/14   Lance Bosch, NP  glucose blood test strip Use as instructed 02/24/14   Nishant Dhungel, MD  hyoscyamine (LEVSIN SL) 0.125 MG SL tablet Place 1 tablet (0.125 mg total) under the tongue 3 (three) times daily. 10/14/13   Theodis Blaze, MD  Insulin Glargine (LANTUS SOLOSTAR) 100 UNIT/ML Solostar Pen Inject 30 Units into the skin daily at 10 pm. 02/18/14   Tresa Garter, MD  insulin NPH-regular Human (NOVOLIN 70/30) (70-30) 100 UNIT/ML injection Inject 15 Units into the skin 2 (two) times daily with a meal. Insulin syringes and needles as needed. 02/18/14   Tresa Garter, MD  Lancets (ACCU-CHEK MULTICLIX) lancets Use as instructed 02/24/14   Nishant Dhungel, MD  medroxyPROGESTERone (DEPO-PROVERA) 150 MG/ML injection Inject 150 mg into the muscle every 3 (three) months.    Historical Provider, MD  metoCLOPramide (REGLAN) 10 MG tablet Take 1 tablet (10 mg total) by mouth every 6 (six) hours as needed for nausea or vomiting. 02/24/14   Nishant Dhungel, MD  pantoprazole (PROTONIX) 40 MG tablet Take 1 tablet (40 mg total) by mouth daily. Switch for any other PPI at similar dose and frequency 12/25/13   Thurnell Lose, MD  polyethylene glycol (MIRALAX / GLYCOLAX) packet Take 17 g by mouth daily. 12/25/13   Thurnell Lose, MD  potassium chloride 20 MEQ TBCR Take 20 mEq by mouth daily. 11/24/13   Belkys A Regalado, MD  risperidone (RISPERDAL) 2 MG tablet Take 1 tablet (2 mg total) by mouth at bedtime. 02/18/14   Tresa Garter, MD  Syringe, Disposable, 1 ML MISC 1 application by Does not apply route 2 (two) times daily. 02/24/14   Nishant Dhungel, MD   BP 139/84  Pulse 85  Temp(Src) 97.9 F (36.6 C) (Oral)  Resp 23  SpO2 98% Physical Exam  Nursing note and vitals reviewed. Constitutional: She is oriented to person, place, and time. She appears well-developed and well-nourished. No distress.  HENT:  Head:  Normocephalic and atraumatic.  Mouth/Throat: Oropharynx is clear and moist.  Eyes: EOM are normal. Pupils are equal, round, and reactive to light.  Neck: Normal range of motion. Neck supple.  Cardiovascular: Normal rate and regular rhythm.  Exam reveals no friction rub.   No murmur heard. Pulmonary/Chest: Effort normal and breath sounds normal. No respiratory distress. She has no wheezes. She has no rales.  Abdominal: Soft. She exhibits no distension. There is tenderness (diffuse). There is no rebound.  Musculoskeletal: Normal range of motion. She exhibits no edema.  Neurological: She is alert and oriented to person, place, and time.  Skin: Skin is warm. No rash noted. She  is not diaphoretic.    ED Course  Procedures (including critical care time) Labs Review Labs Reviewed  CBC - Abnormal; Notable for the following:    WBC 18.2 (*)    RBC 5.95 (*)    MCV 71.3 (*)    MCH 23.0 (*)    All other components within normal limits  COMPREHENSIVE METABOLIC PANEL - Abnormal; Notable for the following:    Glucose, Bld 472 (*)    BUN 34 (*)    Calcium 10.6 (*)    Total Protein 9.1 (*)    AST 46 (*)    ALT 50 (*)    Anion gap 22 (*)    All other components within normal limits  URINALYSIS, ROUTINE W REFLEX MICROSCOPIC - Abnormal; Notable for the following:    Specific Gravity, Urine 1.038 (*)    Glucose, UA >1000 (*)    Ketones, ur 15 (*)    All other components within normal limits  BLOOD GAS, VENOUS - Abnormal; Notable for the following:    pH, Ven 7.399 (*)    pCO2, Ven 37.9 (*)    All other components within normal limits  I-STAT CG4 LACTIC ACID, ED - Abnormal; Notable for the following:    Lactic Acid, Venous 3.59 (*)    All other components within normal limits  URINE MICROSCOPIC-ADD ON  I-STAT TROPOININ, ED  CBG MONITORING, ED    Imaging Review Ct Abdomen Pelvis W Contrast  03/08/2014   CLINICAL DATA:  Diffuse abdominal pain, vomiting.  EXAM: CT ABDOMEN AND PELVIS WITH  CONTRAST  TECHNIQUE: Multidetector CT imaging of the abdomen and pelvis was performed using the standard protocol following bolus administration of intravenous contrast.  CONTRAST:  159mL OMNIPAQUE IOHEXOL 300 MG/ML  SOLN  COMPARISON:  CT scan of June 26, 2013.  FINDINGS: Visualized lung bases appear normal. No significant osseous abnormality is noted.  Status post cholecystectomy. No focal abnormality seen in the liver, spleen or pancreas. Adrenal glands and kidneys appear normal. No hydronephrosis or renal obstruction is noted. No renal or ureteral calculi are noted. There is no evidence of bowel obstruction. The appendix appears normal. Urinary bladder and uterus appear normal. The ovaries appear normal. No abnormal fluid collection is noted. No significant adenopathy is noted.  IMPRESSION: No significant abnormality seen in the abdomen or pelvis.   Electronically Signed   By: Sabino Dick M.D.   On: 03/08/2014 11:43     EKG Interpretation None      MDM   Final diagnoses:  Hyperglycemia  Generalized abdominal pain    63F here with abdominal pain, vomiting. Blood sugars elevated for past several days. Hx of DKA. Here AFVSS, in pain. Diffuse abdominal pain. Denies noncompliance. Labs show normal pH, elevated white count, elevated BUN c/w dehydration. Lactic acid elevated. Admitted by medicine.    Evelina Bucy, MD 03/08/14 1515

## 2014-03-08 NOTE — ED Notes (Signed)
ATTEMPTED IV X 2 NOT SUCCESSFUL IV TEAM PAGED

## 2014-03-08 NOTE — ED Notes (Signed)
Patient's CG4 was 3.59.EDP Mingo Amber notified. RN's Caryl Pina and Lilia Pro notified also

## 2014-03-08 NOTE — ED Notes (Signed)
Bed: WA11 Expected date:  Expected time:  Means of arrival:  Comments: EMS hyperglycemia 

## 2014-03-08 NOTE — ED Notes (Signed)
MD at bedside. 

## 2014-03-09 DIAGNOSIS — D509 Iron deficiency anemia, unspecified: Secondary | ICD-10-CM

## 2014-03-09 DIAGNOSIS — D72829 Elevated white blood cell count, unspecified: Secondary | ICD-10-CM

## 2014-03-09 DIAGNOSIS — R1084 Generalized abdominal pain: Secondary | ICD-10-CM

## 2014-03-09 LAB — BASIC METABOLIC PANEL
Anion gap: 15 (ref 5–15)
BUN: 22 mg/dL (ref 6–23)
CALCIUM: 8.8 mg/dL (ref 8.4–10.5)
CO2: 18 meq/L — AB (ref 19–32)
CREATININE: 0.55 mg/dL (ref 0.50–1.10)
Chloride: 112 mEq/L (ref 96–112)
GFR calc Af Amer: >90 mL/min (ref >90)
GFR calc non Af Amer: >90 mL/min (ref >90)
Glucose, Bld: 124 mg/dL — ABNORMAL HIGH (ref 70–99)
Potassium: 4 mEq/L (ref 3.7–5.3)
Sodium: 145 mEq/L (ref 137–147)

## 2014-03-09 LAB — CBC
HEMATOCRIT: 34.4 % — AB (ref 36.0–46.0)
Hemoglobin: 11 g/dL — ABNORMAL LOW (ref 12.0–15.0)
MCH: 23 pg — ABNORMAL LOW (ref 26.0–34.0)
MCHC: 32 g/dL (ref 30.0–36.0)
MCV: 72 fL — ABNORMAL LOW (ref 78.0–100.0)
Platelets: 151 10*3/uL (ref 150–400)
RBC: 4.78 MIL/uL (ref 3.87–5.11)
RDW: 14.7 % (ref 11.5–15.5)
WBC: 17.3 10*3/uL — AB (ref 4.0–10.5)

## 2014-03-09 LAB — GLUCOSE, CAPILLARY
GLUCOSE-CAPILLARY: 227 mg/dL — AB (ref 70–99)
GLUCOSE-CAPILLARY: 212 mg/dL — AB (ref 70–99)
GLUCOSE-CAPILLARY: 271 mg/dL — AB (ref 70–99)
Glucose-Capillary: 435 mg/dL — ABNORMAL HIGH (ref 70–99)
Glucose-Capillary: 298 mg/dL — ABNORMAL HIGH (ref 70–99)
Glucose-Capillary: 186 mg/dL — ABNORMAL HIGH (ref 70–99)

## 2014-03-09 MED ORDER — HYDROCODONE-ACETAMINOPHEN 5-325 MG PO TABS
1.0000 | ORAL_TABLET | ORAL | Status: DC | PRN
  Administered 2014-03-09 – 2014-03-10 (×5): 2 via ORAL
  Filled 2014-03-09 (×5): qty 2

## 2014-03-09 MED ORDER — INSULIN ASPART 100 UNIT/ML ~~LOC~~ SOLN
0.0000 [IU] | Freq: Three times a day (TID) | SUBCUTANEOUS | Status: DC
Start: 2014-03-10 — End: 2014-03-10
  Administered 2014-03-10: 5 [IU] via SUBCUTANEOUS
  Administered 2014-03-10: 7 [IU] via SUBCUTANEOUS

## 2014-03-09 MED ORDER — INSULIN ASPART 100 UNIT/ML ~~LOC~~ SOLN
0.0000 [IU] | Freq: Every day | SUBCUTANEOUS | Status: DC
  Administered 2014-03-09: 3 [IU] via SUBCUTANEOUS

## 2014-03-09 MED ORDER — KCL IN DEXTROSE-NACL 20-5-0.45 MEQ/L-%-% IV SOLN
INTRAVENOUS | Status: DC
  Administered 2014-03-09 – 2014-03-10 (×3): via INTRAVENOUS
  Filled 2014-03-09 (×3): qty 1000

## 2014-03-09 MED ORDER — INSULIN GLARGINE 100 UNIT/ML ~~LOC~~ SOLN
15.0000 [IU] | Freq: Every day | SUBCUTANEOUS | Status: DC
  Administered 2014-03-09 – 2014-03-10 (×2): 15 [IU] via SUBCUTANEOUS
  Filled 2014-03-09 (×2): qty 0.15

## 2014-03-09 NOTE — Progress Notes (Signed)
Patient's blood sugar last night was 75. Patient given a snack and told to call if she started feeling like she had low blood sugar.

## 2014-03-09 NOTE — Progress Notes (Signed)
Awaken when enter room. Voice no complaints no distress noted. Family at bedside. Will continue to monitor

## 2014-03-09 NOTE — Progress Notes (Signed)
[facility] PROGRESS NOTE  Mary [name] CWC:[id number] DOB: 03/28/1971 DOA: 03/08/2014 PCP: Angelica Chessman, MD  Assessment/Plan  Abdominal pain, nausea and vomiting  - most likely secondary to gastroparesis from uncontrolled DM  - place on Reglan, added antiemetics scheduled and as needed  - advance diet as tolerated -  K-pad   Uncontrolled DM with complications of gastroparesis, neuropathy  - last A1C > 13, 2 months ago  - pt non compliant with medical regimen  - continue SSI for now  - resume lantus 15 units and increase as needed - start SSI while not tolerating PO well - pt on Lantus and Insulin 70/30 at home >> will aim to minimize number of injections if possible - continue neurontin  Transaminitis  - from hep C  Leukocytosis improving somewhat - unclear etiology  - pt is afebrile, UA unremarkable, no specific respiratory symptoms  - will hold off on ABX for now  - CT ab/p without evidence of infection Microcytic anemia -  Occult stool -  Iron studies, b12, folate, TSH  Severe protein-calorie malnutrition, from uncontrolled DM, advance diet as tolerated  Seizures, stable, continue home medical regimen   Diet:  Full liquid Access:  PIV IVF:  yes Proph:  lovenox  Code Status: full Family Communication: patient alone Disposition Plan: pending tolerating diet, CBG stable   Consultants:  Diabetic educator, greatly appreciate assistance  Procedures:  CT abd/pelvis  Antibiotics:  none   HPI/Subjective:  Still having abdominal pain, nausea, difficulty tolerating PO.    Objective: Filed Vitals:   03/08/14 1206 03/08/14 1315 03/08/14 2104 03/09/14 0511  BP: 136/77 153/80 129/72 100/76  Pulse: 99 95 80 92  Temp:  98.2 F (36.8 C) 98 F (36.7 C) 99.3 F (37.4 C)  TempSrc:  Oral Oral Oral  Resp: 16 16 17 18   Height:  5\' 4"  (1.626 m)    Weight:  53.1 kg (117 lb 1 oz)    SpO2: 98% 100% 100% 99%    Intake/Output Summary (Last 24 hours) at  03/09/14 0847 Last data filed at 03/09/14 0600  Gross per 24 hour  Intake 3978.33 ml  Output      0 ml  Net 3978.33 ml   Filed Weights   03/08/14 1315  Weight: 53.1 kg (117 lb 1 oz)    Exam:   General:  BF, No acute distress  HEENT:  NCAT, MMM  Cardiovascular:  RRR, nl S1, S2 no mrg, 2+ pulses, warm extremities  Respiratory:  CTAB, no increased WOB  Abdomen:   NABS, soft, ND, diffusely TTP without rebound or guarding  MSK:   Normal tone and bulk, no LEE  Neuro:  Grossly intact  Data Reviewed: Basic Metabolic Panel:  Recent Labs Lab 03/08/14 1012 03/09/14 0410  NA 140 145  K 4.2 4.0  CL 97 112  CO2 21 18*  GLUCOSE 472* 124*  BUN 34* 22  CREATININE 0.67 0.55  CALCIUM 10.6* 8.8   Liver Function Tests:  Recent Labs Lab 03/08/14 1012  AST 46*  ALT 50*  ALKPHOS 107  BILITOT 0.4  PROT 9.1*  ALBUMIN 4.6   No results found for this basename: LIPASE, AMYLASE,  in the last 168 hours No results found for this basename: AMMONIA,  in the last 168 hours CBC:  Recent Labs Lab 03/08/14 1012 03/09/14 0410  WBC 18.2* 17.3*  HGB 13.7 11.0*  HCT 42.4 34.4*  MCV 71.3* 72.0*  PLT 236 151   Cardiac Enzymes: No  results found for this basename: CKTOTAL, CKMB, CKMBINDEX, TROPONINI,  in the last 168 hours BNP (last 3 results) No results found for this basename: PROBNP,  in the last 8760 hours CBG:  Recent Labs Lab 03/08/14 0922 03/08/14 1155 03/08/14 1638 03/08/14 2140  GLUCAP 435* 298* 145* 75    No results found for this or any previous visit (from the past 240 hour(s)).   Studies: Ct Abdomen Pelvis W Contrast  03/08/2014   CLINICAL DATA:  Diffuse abdominal pain, vomiting.  EXAM: CT ABDOMEN AND PELVIS WITH CONTRAST  TECHNIQUE: Multidetector CT imaging of the abdomen and pelvis was performed using the standard protocol following bolus administration of intravenous contrast.  CONTRAST:  19mL OMNIPAQUE IOHEXOL 300 MG/ML  SOLN  COMPARISON:  CT scan of  June 26, 2013.  FINDINGS: Visualized lung bases appear normal. No significant osseous abnormality is noted.  Status post cholecystectomy. No focal abnormality seen in the liver, spleen or pancreas. Adrenal glands and kidneys appear normal. No hydronephrosis or renal obstruction is noted. No renal or ureteral calculi are noted. There is no evidence of bowel obstruction. The appendix appears normal. Urinary bladder and uterus appear normal. The ovaries appear normal. No abnormal fluid collection is noted. No significant adenopathy is noted.  IMPRESSION: No significant abnormality seen in the abdomen or pelvis.   Electronically Signed   By: Sabino Dick M.D.   On: 03/08/2014 11:43    Scheduled Meds: . aspirin EC  81 mg Oral Daily  . divalproex  250 mg Oral BID  . enoxaparin (LOVENOX) injection  40 mg Subcutaneous Q24H  . gabapentin  300 mg Oral TID  . insulin glargine  15 Units Subcutaneous Daily  . metoCLOPramide  10 mg Oral TID AC & HS  . ondansetron (ZOFRAN) IV  4 mg Intravenous 4 times per day  . pantoprazole  40 mg Oral Daily  . risperiDONE  2 mg Oral QHS  . sodium chloride  3 mL Intravenous Q12H   Continuous Infusions: . dextrose 5 % and 0.45 % NaCl with KCl 20 mEq/L 75 mL/hr at 03/09/14 0800    Active Problems:   Abdominal pain    Time spent: 30 min    Ayyub Krall, [facility]  [facility] Pager [phone]. If 7PM-7AM, please contact night-coverage at [email or web address], password [facility] 03/09/2014, 8:47 AM  LOS: 1 day

## 2014-03-09 NOTE — Progress Notes (Signed)
CARE MANAGEMENT NOTE 03/09/2014  Patient:  Mary [name], Mary [name]   Account Number:  [id number]  Date Initiated:  03/09/2014  Documentation initiated by:  Gabriel Earing  Subjective/Objective Assessment:   pt admitted with abd pain, gastroparesis     Action/Plan:   from home   Anticipated DC Date:  03/11/2014   Anticipated DC Plan:  HOME/SELF CARE         Choice offered to / List presented to:             Status of service:  In process, will continue to follow Medicare Important Message given?   (If response is "NO", the following Medicare IM given date fields will be blank) Date Medicare IM given:   Medicare IM given by:   Date Additional Medicare IM given:   Additional Medicare IM given by:    Discharge Disposition:    Per UR Regulation:  Reviewed for med. necessity/level of care/duration of stay  If discussed at [place] of Stay Meetings, dates discussed:    Comments:  03/09/14 MMcGibboney, RN, BSN Pt's PCP is with [facility], [id number]. Pt is non complaint.

## 2014-03-10 DIAGNOSIS — K3184 Gastroparesis: Secondary | ICD-10-CM

## 2014-03-10 DIAGNOSIS — E1149 Type 2 diabetes mellitus with other diabetic neurological complication: Secondary | ICD-10-CM

## 2014-03-10 DIAGNOSIS — F319 Bipolar disorder, unspecified: Secondary | ICD-10-CM

## 2014-03-10 LAB — BASIC METABOLIC PANEL
ANION GAP: 10 (ref 5–15)
BUN: 7 mg/dL (ref 6–23)
CHLORIDE: 106 meq/L (ref 96–112)
CO2: 22 mEq/L (ref 19–32)
Calcium: 8.8 mg/dL (ref 8.4–10.5)
Creatinine, Ser: 0.52 mg/dL (ref 0.50–1.10)
GFR calc Af Amer: >90 mL/min (ref >90)
Glucose, Bld: 295 mg/dL — ABNORMAL HIGH (ref 70–99)
Potassium: 4.2 mEq/L (ref 3.7–5.3)
Sodium: 138 mEq/L (ref 137–147)

## 2014-03-10 LAB — GLUCOSE, CAPILLARY
GLUCOSE-CAPILLARY: 315 mg/dL — AB (ref 70–99)
GLUCOSE-CAPILLARY: 268 mg/dL — AB (ref 70–99)

## 2014-03-10 LAB — CBC
HCT: 35.1 % — ABNORMAL LOW (ref 36.0–46.0)
Hemoglobin: 11.2 g/dL — ABNORMAL LOW (ref 12.0–15.0)
MCH: 23.1 pg — AB (ref 26.0–34.0)
MCHC: 31.9 g/dL (ref 30.0–36.0)
MCV: 72.5 fL — AB (ref 78.0–100.0)
PLATELETS: 150 10*3/uL (ref 150–400)
RBC: 4.84 MIL/uL (ref 3.87–5.11)
RDW: 14.1 % (ref 11.5–15.5)
WBC: 9.7 10*3/uL (ref 4.0–10.5)

## 2014-03-10 LAB — IRON AND TIBC
IRON: 183 ug/dL — AB (ref 42–135)
Saturation Ratios: 56 % — ABNORMAL HIGH (ref 20–55)
TIBC: 329 ug/dL (ref 250–470)
UIBC: 146 ug/dL (ref 125–400)

## 2014-03-10 LAB — TSH: TSH: 1.64 u[IU]/mL (ref 0.350–4.500)

## 2014-03-10 LAB — FERRITIN: Ferritin: 231 ng/mL (ref 10–291)

## 2014-03-10 LAB — FOLATE RBC: RBC FOLATE: 909 ng/mL — AB (ref >280)

## 2014-03-10 LAB — TRANSFERRIN: Transferrin: 272 mg/dL (ref 200–360)

## 2014-03-10 MED ORDER — METOCLOPRAMIDE HCL 10 MG PO TABS: 10.0000 mg | ORAL_TABLET | Freq: Three times a day (TID) | ORAL | Status: DC

## 2014-03-10 MED ORDER — HYDROCODONE-ACETAMINOPHEN 5-325 MG PO TABS: 1.0000 | ORAL_TABLET | ORAL | Status: DC | PRN

## 2014-03-10 MED ORDER — ONDANSETRON HCL 4 MG PO TABS: 4.0000 mg | ORAL_TABLET | Freq: Four times a day (QID) | ORAL | Status: DC | PRN

## 2014-03-10 NOTE — Progress Notes (Signed)
Inpatient Diabetes Program Recommendations  AACE/ADA: New Consensus Statement on Inpatient Glycemic Control (2013)  Target Ranges:  Prepandial:   less than 140 mg/dL      Peak postprandial:   less than 180 mg/dL (1-2 hours)      Critically ill patients:  140 - 180 mg/dL   Inpatient Diabetes Program Recommendations Insulin - IV drip/GlucoStabilizer: xxx Insulin - Basal: Based on pt wt in kg, pt should need total of at least 35 units to 40 units basal insulin. Per note,  Her 70/30 dose at home was to be at 15 units bid which basal component totals 20 units. On her last visit to [facility] per phonecall made by diabetes coordinator yesterday, pt was instructed to take 70/30  15 units bid in addition to lantus 30 units.If plan is to continue basal lantus,  pt would benefit from increase in daily/HS lantus/levemir to at least 35-40 units. and add meal coverage per below:   Correction (SSI): Continue correction as ordered. Insulin - Meal Coverage: 3-4 units tidwc HgbA1C: 13.2% - uncontrolled Diet: xxx  Otherwise, if intent is to resume 70/30, please order OP prescribed dose of 15 units am and ac supper in addition to lantus 10-20 units to start (although PCP prescribed the lantus at 30 units in addition to the 70/30)  Thank you, Rosita Kea, RN, CNS, Diabetes Coordinator [phone])

## 2014-03-10 NOTE — Discharge Summary (Signed)
Physician Discharge Summary  Mary [name] MWU:[id number] DOB: 12/21/1970 DOA: 03/08/2014  PCP: Angelica Chessman, MD  Admit date: 03/08/2014 Discharge date: 03/10/2014  Recommendations for Outpatient Follow-up:  1. Follow up with primary care doctor in 1 week for ongoing management of gastroparesis.  Vit b12, folate are pending at time of discharge.   2. Resume levemir 30 units and if tolerating meals and CBG > 180, resume 70/30 insulin also  Discharge Diagnoses:  Active Problems:   Microcytic anemia   DM (diabetes mellitus), type 1, uncontrolled   Nausea with vomiting   Gastroparesis due to DM   Chronic abdominal pain   Abdominal pain   Discharge Condition: stable, improved  Diet recommendation: diabetic diet  Wt Readings from Last 3 Encounters:  03/08/14 53.1 kg (117 lb 1 oz)  01/27/14 56.609 kg (124 lb 12.8 oz)  01/22/14 58.8 kg (129 lb 10.1 oz)    History of present illness:  43 y.o. 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 Patient is not a good historian. Although she is alert, she is not forthcoming regarding her history. In ED, VSS, CBG was 400 but pt was not in DKA.   Hospital Course:   Abdominal pain, nausea and vomiting likely secondary to gastroparesis from uncontrolled DM.  She was started on reglan, pain medication, and antiemetics.  She was able to stay hydrated with a full liquid diet and tolerate some solid foods.  She used a k-pad for pain management also.  She should continue reglan four times daily with nausea medication as needed.  She was given a prescription for hydrocodone to use as needed  for breakthrough pain, but advised to minimize this medication as it can worsen gastroparesis.  She was advised that the better controlled her diabetes is, the less abdominal discomfort she may have.    Uncontrolled DM with complications of gastroparesis, neuropathy, A1C > 13, 2 months ago and with a history of noncompliance.  She should resume her levemir 30 units and restart her 70/30 insulin once she is eating more reliably or if her CBGs remain greater than 180.  She continued neurontin.  Transaminitis, stable, attributed to hep C.  Leukocytosis likely due to vomiting and dehydration and resolved with IVF and antiemetics.  She remained afebrile.  UA was unremarkable and she did not have respiratory symptoms.  CT ab/p without evidence of infection.  Microcytic anemia.  Occult stool not obtained.  Iron studies with elevated iron saturation and iron level.  TSH 1.64 wnl.  Vit b12, folate are pending at time of discharge.   Severe protein-calorie malnutrition, from uncontrolled DM, advanced diet as tolerated.  Recommended supplements with glucerna if needed.   Seizures, stable, continued home medical regimen   Consultants:  Diabetic educator, greatly appreciate assistance Procedures:  CT abd/pelvis Antibiotics:  none    Discharge Exam: Filed Vitals:   03/09/14 2041  BP: 127/74  Pulse: 74  Temp: 99.4 F (37.4 C)  Resp: 18   Filed Vitals:   03/09/14 0511 03/09/14 1345 03/09/14 2041 03/10/14 0503  BP: 100/76 127/75 127/74   Pulse: 92 94 74   Temp: 99.3 F (37.4 C) 98.2 F (36.8 C)  99.4 F (37.4 C)   TempSrc: Oral Oral Oral Oral  Resp: 18 16 18    Height:      Weight:      SpO2: 99% 100% 100%     General: BF, No acute distress, smiling HEENT: NCAT, MMM  Cardiovascular: RRR, nl S1, S2 no mrg, 2+ pulses, warm extremities  Respiratory: CTAB, no increased WOB  Abdomen: NABS, soft, ND, minimally TTP diffusely without rebound or guarding  MSK: Normal tone and bulk, no LEE   Neuro: Grossly intact   Discharge Instructions      Discharge Instructions   Call MD for:  difficulty breathing, headache or visual disturbances    Complete by:  As directed      Call MD for:  extreme fatigue    Complete by:  As directed      Call MD for:  hives    Complete by:  As directed      Call MD for:  persistant dizziness or light-headedness    Complete by:  As directed      Call MD for:  persistant nausea and vomiting    Complete by:  As directed      Call MD for:  severe uncontrolled pain    Complete by:  As directed      Call MD for:  temperature >100.4    Complete by:  As directed      Diet Carb Modified    Complete by:  As directed      Discharge instructions    Complete by:  As directed   You were hospitalized with nausea, vomiting and abdominal pain which are likely due to gastroparesis.  Please take reglan four times a day until you follow up with your primary care doctor.  You may use zofran as needed for breakthrough nausea.  Please try to minimize your hydrocodone for your abdominal pain because this medication makes it even more difficult for your stomach to empty and can worsen gastroparesis.  Please restart your levemir 30 units and if your blood sugar remains greater than 180 and you are eating a little better, resume your 70/30 mixed insulin also.  Please follow up with your primary care doctor in 2 weeks for reevaluation.     Driving Restrictions    Complete by:  As directed   No driving or operating heavy machinery while taking hydrocodone.     Increase activity slowly    Complete by:  As directed             Medication List         aspirin EC 81 MG tablet  Take 81 mg by mouth daily.     divalproex 250 MG DR tablet  Commonly known as:  DEPAKOTE  Take 250 mg by mouth 2 times daily at 12 noon and 4 pm.     docusate sodium 100 MG capsule  Commonly known as:  COLACE  Take 200 mg by mouth daily as needed for mild constipation.     estradiol  cypionate 5 MG/ML injection  Commonly known as:  DEPO-ESTRADIOL  Inject into the muscle every 28 (twenty-eight) days.     gabapentin 300 MG capsule  Commonly known as:  NEURONTIN  Take 300 mg by mouth 3 (three) times daily.     HYDROcodone-acetaminophen 5-325 MG per tablet  Commonly known as:  NORCO/VICODIN  Take 1-2 tablets by mouth every 4 (four) hours as needed for moderate pain or severe pain.  hyoscyamine 0.125 MG SL tablet  Commonly known as:  LEVSIN SL  Place 0.125 mg under the tongue every 4 (four) hours as needed for cramping.     insulin NPH-regular Human (70-30) 100 UNIT/ML injection  Commonly known as:  NOVOLIN 70/30  Inject 15 Units into the skin 2 (two) times daily with a meal.     LANTUS SOLOSTAR 100 UNIT/ML Solostar Pen  Generic drug:  Insulin Glargine  Inject 30 Units into the skin daily at 10 pm.     medroxyPROGESTERone 150 MG/ML injection  Commonly known as:  DEPO-PROVERA  Inject 150 mg into the muscle every 3 (three) months.     metoCLOPramide 10 MG tablet  Commonly known as:  REGLAN  Take 1 tablet (10 mg total) by mouth 4 (four) times daily -  before meals and at bedtime.     ondansetron 4 MG tablet  Commonly known as:  ZOFRAN  Take 1 tablet (4 mg total) by mouth every 6 (six) hours as needed for nausea.     pantoprazole 40 MG tablet  Commonly known as:  PROTONIX  Take 40 mg by mouth daily.     polyethylene glycol packet  Commonly known as:  MIRALAX / GLYCOLAX  Take 17 g by mouth daily as needed for mild constipation.     potassium chloride SA 20 MEQ tablet  Commonly known as:  K-DUR,KLOR-CON  Take 20 mEq by mouth daily.     risperiDONE 2 MG tablet  Commonly known as:  RISPERDAL  Take 2 mg by mouth at bedtime.       Follow-up Information   Follow up with Angelica Chessman, MD. Schedule an appointment as soon as possible for a visit in 2 weeks.   Specialty:  Internal Medicine   Contact information:   [address] [place]  [id number] [phone]        The results of significant diagnostics from this hospitalization (including imaging, microbiology, ancillary and laboratory) are listed below for reference.    Significant Diagnostic Studies: Ct Abdomen Pelvis W Contrast  03/08/2014   CLINICAL DATA:  Diffuse abdominal pain, vomiting.  EXAM: CT ABDOMEN AND PELVIS WITH CONTRAST  TECHNIQUE: Multidetector CT imaging of the abdomen and pelvis was performed using the standard protocol following bolus administration of intravenous contrast.  CONTRAST:  135mL OMNIPAQUE IOHEXOL 300 MG/ML  SOLN  COMPARISON:  CT scan of June 26, 2013.  FINDINGS: Visualized lung bases appear normal. No significant osseous abnormality is noted.  Status post cholecystectomy. No focal abnormality seen in the liver, spleen or pancreas. Adrenal glands and kidneys appear normal. No hydronephrosis or renal obstruction is noted. No renal or ureteral calculi are noted. There is no evidence of bowel obstruction. The appendix appears normal. Urinary bladder and uterus appear normal. The ovaries appear normal. No abnormal fluid collection is noted. No significant adenopathy is noted.  IMPRESSION: No significant abnormality seen in the abdomen or pelvis.   Electronically Signed   By: Sabino Dick M.D.   On: 03/08/2014 11:43    Microbiology: No results found for this or any previous visit (from the past 240 hour(s)).   Labs: Basic Metabolic Panel:  Recent Labs Lab 03/08/14 1012 03/09/14 0410 03/10/14 0650  NA 140 145 138  K 4.2 4.0 4.2  CL 97 112 106  CO2 21 18* 22  GLUCOSE 472* 124* 295*  BUN 34* 22 7  CREATININE 0.67 0.55 0.52  CALCIUM 10.6* 8.8 8.8   Liver Function  Tests:  Recent Labs Lab 03/08/14 1012  AST 46*  ALT 50*  ALKPHOS 107  BILITOT 0.4  PROT 9.1*  ALBUMIN 4.6   No results found for this basename: LIPASE, AMYLASE,  in the last 168 hours No results found for this basename: AMMONIA,  in the last 168 hours CBC:  Recent  Labs Lab 03/08/14 1012 03/09/14 0410 03/10/14 0400  WBC 18.2* 17.3* 9.7  HGB 13.7 11.0* 11.2*  HCT 42.4 34.4* 35.1*  MCV 71.3* 72.0* 72.5*  PLT 236 151 150   Cardiac Enzymes: No results found for this basename: CKTOTAL, CKMB, CKMBINDEX, TROPONINI,  in the last 168 hours BNP: BNP (last 3 results) No results found for this basename: PROBNP,  in the last 8760 hours CBG:  Recent Labs Lab 03/08/14 2140 03/09/14 0734 03/09/14 1144 03/09/14 1655 03/09/14 2038  GLUCAP 75 186* 227* 212* 271*    Time coordinating discharge: 35 minutes  Signed:  Lugene Hitt  [facility] 03/10/2014, 11:09 AM

## 2014-03-11 LAB — VITAMIN B12: VITAMIN B 12: 1225 pg/mL — AB (ref 211–911)

## 2014-03-21 ENCOUNTER — Emergency Department (HOSPITAL_COMMUNITY): Payer: Medicaid Other

## 2014-03-21 ENCOUNTER — Inpatient Hospital Stay (HOSPITAL_COMMUNITY)
Admission: EM | Admit: 2014-03-21 | Discharge: 2014-03-22 | DRG: 637 | Disposition: A | Discharge status: Home / Self Care | Payer: Medicaid Other | Attending: Internal Medicine | Admitting: Internal Medicine

## 2014-03-21 ENCOUNTER — Encounter (HOSPITAL_COMMUNITY): Payer: Self-pay | Admitting: Emergency Medicine

## 2014-03-21 DIAGNOSIS — B1920 Unspecified viral hepatitis C without hepatic coma: Secondary | ICD-10-CM | POA: Diagnosis present

## 2014-03-21 DIAGNOSIS — Z720 Tobacco use: Secondary | ICD-10-CM | POA: Diagnosis present

## 2014-03-21 DIAGNOSIS — Z681 Body mass index (BMI) 19 or less, adult: Secondary | ICD-10-CM | POA: Diagnosis not present

## 2014-03-21 DIAGNOSIS — I10 Essential (primary) hypertension: Secondary | ICD-10-CM | POA: Diagnosis present

## 2014-03-21 DIAGNOSIS — F4310 Post-traumatic stress disorder, unspecified: Secondary | ICD-10-CM | POA: Diagnosis present

## 2014-03-21 DIAGNOSIS — D509 Iron deficiency anemia, unspecified: Secondary | ICD-10-CM | POA: Diagnosis present

## 2014-03-21 DIAGNOSIS — E1143 Type 2 diabetes mellitus with diabetic autonomic (poly)neuropathy: Secondary | ICD-10-CM | POA: Diagnosis present

## 2014-03-21 DIAGNOSIS — K3184 Gastroparesis: Secondary | ICD-10-CM | POA: Diagnosis present

## 2014-03-21 DIAGNOSIS — E1110 Type 2 diabetes mellitus with ketoacidosis without coma: Secondary | ICD-10-CM | POA: Diagnosis present

## 2014-03-21 DIAGNOSIS — D72829 Elevated white blood cell count, unspecified: Secondary | ICD-10-CM | POA: Diagnosis present

## 2014-03-21 DIAGNOSIS — R1112 Projectile vomiting: Secondary | ICD-10-CM

## 2014-03-21 DIAGNOSIS — Z833 Family history of diabetes mellitus: Secondary | ICD-10-CM | POA: Diagnosis not present

## 2014-03-21 DIAGNOSIS — M419 Scoliosis, unspecified: Secondary | ICD-10-CM | POA: Diagnosis present

## 2014-03-21 DIAGNOSIS — K7460 Unspecified cirrhosis of liver: Secondary | ICD-10-CM | POA: Diagnosis present

## 2014-03-21 DIAGNOSIS — H269 Unspecified cataract: Secondary | ICD-10-CM | POA: Diagnosis present

## 2014-03-21 DIAGNOSIS — F1290 Cannabis use, unspecified, uncomplicated: Secondary | ICD-10-CM | POA: Diagnosis present

## 2014-03-21 DIAGNOSIS — F319 Bipolar disorder, unspecified: Secondary | ICD-10-CM | POA: Diagnosis not present

## 2014-03-21 DIAGNOSIS — E1065 Type 1 diabetes mellitus with hyperglycemia: Secondary | ICD-10-CM | POA: Diagnosis present

## 2014-03-21 DIAGNOSIS — M13871 Other specified arthritis, right ankle and foot: Secondary | ICD-10-CM | POA: Diagnosis present

## 2014-03-21 DIAGNOSIS — G8929 Other chronic pain: Secondary | ICD-10-CM

## 2014-03-21 DIAGNOSIS — Z7982 Long term (current) use of aspirin: Secondary | ICD-10-CM | POA: Diagnosis not present

## 2014-03-21 DIAGNOSIS — F17210 Nicotine dependence, cigarettes, uncomplicated: Secondary | ICD-10-CM | POA: Diagnosis present

## 2014-03-21 DIAGNOSIS — Z9114 Patient's other noncompliance with medication regimen: Secondary | ICD-10-CM | POA: Diagnosis present

## 2014-03-21 DIAGNOSIS — Z91148 Patient's other noncompliance with medication regimen for other reason: Secondary | ICD-10-CM

## 2014-03-21 DIAGNOSIS — Z87442 Personal history of urinary calculi: Secondary | ICD-10-CM | POA: Diagnosis not present

## 2014-03-21 DIAGNOSIS — R109 Unspecified abdominal pain: Secondary | ICD-10-CM

## 2014-03-21 DIAGNOSIS — Z880 Allergy status to penicillin: Secondary | ICD-10-CM

## 2014-03-21 DIAGNOSIS — F3181 Bipolar II disorder: Secondary | ICD-10-CM | POA: Diagnosis present

## 2014-03-21 DIAGNOSIS — G40909 Epilepsy, unspecified, not intractable, without status epilepticus: Secondary | ICD-10-CM

## 2014-03-21 DIAGNOSIS — E43 Unspecified severe protein-calorie malnutrition: Secondary | ICD-10-CM | POA: Diagnosis present

## 2014-03-21 DIAGNOSIS — E876 Hypokalemia: Secondary | ICD-10-CM

## 2014-03-21 DIAGNOSIS — K219 Gastro-esophageal reflux disease without esophagitis: Secondary | ICD-10-CM | POA: Diagnosis present

## 2014-03-21 DIAGNOSIS — E1042 Type 1 diabetes mellitus with diabetic polyneuropathy: Secondary | ICD-10-CM | POA: Diagnosis present

## 2014-03-21 DIAGNOSIS — R011 Cardiac murmur, unspecified: Secondary | ICD-10-CM | POA: Diagnosis present

## 2014-03-21 DIAGNOSIS — Z794 Long term (current) use of insulin: Secondary | ICD-10-CM | POA: Diagnosis not present

## 2014-03-21 DIAGNOSIS — E1010 Type 1 diabetes mellitus with ketoacidosis without coma: Secondary | ICD-10-CM | POA: Diagnosis present

## 2014-03-21 DIAGNOSIS — Z79899 Other long term (current) drug therapy: Secondary | ICD-10-CM | POA: Diagnosis not present

## 2014-03-21 DIAGNOSIS — Z9049 Acquired absence of other specified parts of digestive tract: Secondary | ICD-10-CM | POA: Diagnosis present

## 2014-03-21 DIAGNOSIS — E1043 Type 1 diabetes mellitus with diabetic autonomic (poly)neuropathy: Secondary | ICD-10-CM | POA: Diagnosis present

## 2014-03-21 DIAGNOSIS — IMO0002 Reserved for concepts with insufficient information to code with codable children: Secondary | ICD-10-CM | POA: Diagnosis present

## 2014-03-21 LAB — CBC WITH DIFFERENTIAL/PLATELET
BASOS ABS: 0 10*3/uL (ref 0.0–0.1)
Basophils Relative: 0 % (ref 0–1)
EOS PCT: 0 % (ref 0–5)
Eosinophils Absolute: 0 10*3/uL (ref 0.0–0.7)
HCT: 40.7 % (ref 36.0–46.0)
Hemoglobin: 12.9 g/dL (ref 12.0–15.0)
LYMPHS PCT: 19 % (ref 12–46)
Lymphs Abs: 3 10*3/uL (ref 0.7–4.0)
MCH: 22.6 pg — ABNORMAL LOW (ref 26.0–34.0)
MCHC: 31.7 g/dL (ref 30.0–36.0)
MCV: 71.4 fL — ABNORMAL LOW (ref 78.0–100.0)
Monocytes Absolute: 0.5 10*3/uL (ref 0.1–1.0)
Monocytes Relative: 3 % (ref 3–12)
Neutro Abs: 12.2 10*3/uL — ABNORMAL HIGH (ref 1.7–7.7)
Neutrophils Relative %: 78 % — ABNORMAL HIGH (ref 43–77)
PLATELETS: 265 10*3/uL (ref 150–400)
RBC: 5.7 MIL/uL — AB (ref 3.87–5.11)
RDW: 14.5 % (ref 11.5–15.5)
WBC: 15.7 10*3/uL — ABNORMAL HIGH (ref 4.0–10.5)

## 2014-03-21 LAB — COMPREHENSIVE METABOLIC PANEL
ALBUMIN: 4.5 g/dL (ref 3.5–5.2)
ALT: 43 U/L — AB (ref 0–35)
AST: 43 U/L — AB (ref 0–37)
Alkaline Phosphatase: 101 U/L (ref 39–117)
Anion gap: 25 — ABNORMAL HIGH (ref 5–15)
BILIRUBIN TOTAL: 0.4 mg/dL (ref 0.3–1.2)
BUN: 24 mg/dL — ABNORMAL HIGH (ref 6–23)
CHLORIDE: 96 meq/L (ref 96–112)
CO2: 20 meq/L (ref 19–32)
Calcium: 10.5 mg/dL (ref 8.4–10.5)
Creatinine, Ser: 0.66 mg/dL (ref 0.50–1.10)
GFR calc Af Amer: >90 mL/min (ref >90)
GFR calc non Af Amer: >90 mL/min (ref >90)
Glucose, Bld: 388 mg/dL — ABNORMAL HIGH (ref 70–99)
POTASSIUM: 4.2 meq/L (ref 3.7–5.3)
SODIUM: 141 meq/L (ref 137–147)
Total Protein: 9.1 g/dL — ABNORMAL HIGH (ref 6.0–8.3)

## 2014-03-21 LAB — CBG MONITORING, ED
GLUCOSE-CAPILLARY: 303 mg/dL — AB (ref 70–99)
Glucose-Capillary: 356 mg/dL — ABNORMAL HIGH (ref 70–99)
Glucose-Capillary: 298 mg/dL — ABNORMAL HIGH (ref 70–99)

## 2014-03-21 LAB — BASIC METABOLIC PANEL
ANION GAP: 15 (ref 5–15)
BUN: 20 mg/dL (ref 6–23)
CO2: 20 mEq/L (ref 19–32)
Calcium: 9.1 mg/dL (ref 8.4–10.5)
Chloride: 106 mEq/L (ref 96–112)
Creatinine, Ser: 0.46 mg/dL — ABNORMAL LOW (ref 0.50–1.10)
GFR calc Af Amer: >90 mL/min (ref >90)
Glucose, Bld: 200 mg/dL — ABNORMAL HIGH (ref 70–99)
POTASSIUM: 3.5 meq/L — AB (ref 3.7–5.3)
SODIUM: 141 meq/L (ref 137–147)

## 2014-03-21 LAB — BLOOD GAS, ARTERIAL
Acid-base deficit: 4.8 mmol/L — ABNORMAL HIGH (ref 0.0–2.0)
BICARBONATE: 18.9 meq/L — AB (ref 20.0–24.0)
Drawn by: 257701
FIO2: 0.21 %
O2 Saturation: 97.1 %
PATIENT TEMPERATURE: 37
PH ART: 7.382 (ref 7.350–7.450)
TCO2: 17.2 mmol/L (ref 0–100)
pCO2 arterial: 32.6 mmHg — ABNORMAL LOW (ref 35.0–45.0)
pO2, Arterial: 93.3 mmHg (ref 80.0–100.0)

## 2014-03-21 LAB — GLUCOSE, CAPILLARY
GLUCOSE-CAPILLARY: 186 mg/dL — AB (ref 70–99)
GLUCOSE-CAPILLARY: 108 mg/dL — AB (ref 70–99)
GLUCOSE-CAPILLARY: 154 mg/dL — AB (ref 70–99)
Glucose-Capillary: 230 mg/dL — ABNORMAL HIGH (ref 70–99)

## 2014-03-21 LAB — URINALYSIS, ROUTINE W REFLEX MICROSCOPIC
Bilirubin Urine: NEGATIVE
Glucose, UA: >1000 mg/dL — AB
HGB URINE DIPSTICK: NEGATIVE
Ketones, ur: >80 mg/dL — AB
Leukocytes, UA: NEGATIVE
Nitrite: NEGATIVE
PH: 5.5 (ref 5.0–8.0)
Protein, ur: NEGATIVE mg/dL
SPECIFIC GRAVITY, URINE: 1.041 — AB (ref 1.005–1.030)
UROBILINOGEN UA: 0.2 mg/dL (ref 0.0–1.0)

## 2014-03-21 LAB — RAPID URINE DRUG SCREEN, HOSP PERFORMED
Amphetamines: NOT DETECTED
BENZODIAZEPINES: NOT DETECTED
Barbiturates: NOT DETECTED
COCAINE: NOT DETECTED
Opiates: NOT DETECTED
TETRAHYDROCANNABINOL: POSITIVE — AB

## 2014-03-21 LAB — KETONES, QUALITATIVE

## 2014-03-21 LAB — I-STAT CG4 LACTIC ACID, ED: Lactic Acid, Venous: 2.56 mmol/L — ABNORMAL HIGH (ref 0.5–2.2)

## 2014-03-21 LAB — URINE MICROSCOPIC-ADD ON

## 2014-03-21 LAB — LIPASE, BLOOD: Lipase: 14 U/L (ref 11–59)

## 2014-03-21 LAB — POC URINE PREG, ED: Preg Test, Ur: NEGATIVE

## 2014-03-21 LAB — TROPONIN I: Troponin I: <0.3 ng/mL (ref <0.30)

## 2014-03-21 MED ORDER — SODIUM CHLORIDE 0.9 % IV SOLN
1000.0000 mL | INTRAVENOUS | Status: DC
  Administered 2014-03-21: 1000 mL via INTRAVENOUS

## 2014-03-21 MED ORDER — SODIUM CHLORIDE 0.9 % IV SOLN
INTRAVENOUS | Status: AC
  Administered 2014-03-21: 17:00:00 via INTRAVENOUS

## 2014-03-21 MED ORDER — GLUCOSE 40 % PO GEL: 1.0000 | ORAL | Status: DC | PRN

## 2014-03-21 MED ORDER — DEXTROSE 50 % IV SOLN: 50.0000 mL | Freq: Once | INTRAVENOUS | Status: AC | PRN

## 2014-03-21 MED ORDER — METOCLOPRAMIDE HCL 5 MG/ML IJ SOLN
10.0000 mg | Freq: Once | INTRAMUSCULAR | Status: AC
  Administered 2014-03-21: 10 mg via INTRAVENOUS
  Filled 2014-03-21: qty 2

## 2014-03-21 MED ORDER — INSULIN ASPART 100 UNIT/ML ~~LOC~~ SOLN: 0.0000 [IU] | Freq: Every day | SUBCUTANEOUS | Status: DC

## 2014-03-21 MED ORDER — ENOXAPARIN SODIUM 40 MG/0.4ML ~~LOC~~ SOLN
40.0000 mg | SUBCUTANEOUS | Status: DC
  Administered 2014-03-21: 40 mg via SUBCUTANEOUS
  Filled 2014-03-21 (×2): qty 0.4

## 2014-03-21 MED ORDER — INSULIN GLARGINE 100 UNIT/ML ~~LOC~~ SOLN
30.0000 [IU] | Freq: Every day | SUBCUTANEOUS | Status: DC
  Administered 2014-03-21: 30 [IU] via SUBCUTANEOUS
  Filled 2014-03-21 (×2): qty 0.3

## 2014-03-21 MED ORDER — DEXTROSE 50 % IV SOLN
25.0000 mL | Freq: Once | INTRAVENOUS | Status: AC | PRN
Start: 2014-03-21 — End: 2014-03-21

## 2014-03-21 MED ORDER — METOCLOPRAMIDE HCL 5 MG/ML IJ SOLN
10.0000 mg | Freq: Four times a day (QID) | INTRAMUSCULAR | Status: DC
  Administered 2014-03-21 – 2014-03-22 (×4): 10 mg via INTRAVENOUS
  Filled 2014-03-21 (×7): qty 2

## 2014-03-21 MED ORDER — DEXTROSE 50 % IV SOLN: 25.0000 mL | INTRAVENOUS | Status: DC | PRN

## 2014-03-21 MED ORDER — SODIUM CHLORIDE 0.9 % IV SOLN: INTRAVENOUS | Status: DC

## 2014-03-21 MED ORDER — HYDROMORPHONE HCL 1 MG/ML IJ SOLN
1.0000 mg | Freq: Once | INTRAMUSCULAR | Status: AC
  Administered 2014-03-21: 1 mg via INTRAVENOUS
  Filled 2014-03-21: qty 1

## 2014-03-21 MED ORDER — INSULIN ASPART 100 UNIT/ML ~~LOC~~ SOLN
4.0000 [IU] | Freq: Three times a day (TID) | SUBCUTANEOUS | Status: DC
  Administered 2014-03-22 (×3): 4 [IU] via SUBCUTANEOUS

## 2014-03-21 MED ORDER — ONDANSETRON HCL 4 MG/2ML IJ SOLN: 4.0000 mg | Freq: Three times a day (TID) | INTRAMUSCULAR | Status: DC | PRN

## 2014-03-21 MED ORDER — INSULIN ASPART 100 UNIT/ML ~~LOC~~ SOLN
0.0000 [IU] | Freq: Three times a day (TID) | SUBCUTANEOUS | Status: DC
  Administered 2014-03-22 (×2): 3 [IU] via SUBCUTANEOUS

## 2014-03-21 MED ORDER — MORPHINE SULFATE 2 MG/ML IJ SOLN
1.0000 mg | INTRAMUSCULAR | Status: DC | PRN
  Administered 2014-03-21 – 2014-03-22 (×5): 1 mg via INTRAVENOUS
  Filled 2014-03-21 (×5): qty 1

## 2014-03-21 MED ORDER — SODIUM CHLORIDE 0.9 % IV SOLN
INTRAVENOUS | Status: DC
  Administered 2014-03-21: 3.4 [IU]/h via INTRAVENOUS
  Administered 2014-03-21: 2.4 [IU]/h via INTRAVENOUS
  Filled 2014-03-21: qty 2.5

## 2014-03-21 MED ORDER — SODIUM CHLORIDE 0.9 % IV BOLUS (SEPSIS)
1000.0000 mL | Freq: Once | INTRAVENOUS | Status: AC
  Administered 2014-03-21: 1000 mL via INTRAVENOUS

## 2014-03-21 MED ORDER — DEXTROSE-NACL 5-0.45 % IV SOLN
INTRAVENOUS | Status: DC
  Administered 2014-03-21: 18:00:00 via INTRAVENOUS

## 2014-03-21 MED ORDER — ONDANSETRON HCL 4 MG/2ML IJ SOLN
4.0000 mg | Freq: Four times a day (QID) | INTRAMUSCULAR | Status: DC | PRN
  Administered 2014-03-21: 4 mg via INTRAVENOUS
  Filled 2014-03-21: qty 2

## 2014-03-21 MED ORDER — SODIUM CHLORIDE 0.9 % IV SOLN
INTRAVENOUS | Status: DC
  Administered 2014-03-21: 3.8 [IU]/h via INTRAVENOUS
  Filled 2014-03-21: qty 2.5

## 2014-03-21 MED ORDER — DEXTROSE-NACL 5-0.45 % IV SOLN
INTRAVENOUS | Status: DC
  Administered 2014-03-21: 19:00:00 via INTRAVENOUS

## 2014-03-21 MED ORDER — POTASSIUM CHLORIDE 10 MEQ/100ML IV SOLN
10.0000 meq | INTRAVENOUS | Status: AC
  Administered 2014-03-21 (×2): 10 meq via INTRAVENOUS
  Filled 2014-03-21 (×2): qty 100

## 2014-03-21 MED ORDER — DIPHENHYDRAMINE HCL 50 MG/ML IJ SOLN
25.0000 mg | Freq: Once | INTRAMUSCULAR | Status: AC
  Administered 2014-03-21: 25 mg via INTRAVENOUS
  Filled 2014-03-21: qty 1

## 2014-03-21 MED ORDER — PANTOPRAZOLE SODIUM 40 MG IV SOLR
40.0000 mg | INTRAVENOUS | Status: DC
  Administered 2014-03-21: 40 mg via INTRAVENOUS
  Filled 2014-03-21 (×2): qty 40

## 2014-03-21 NOTE — ED Notes (Signed)
Bed: EB58 Expected date:  Expected time:  Means of arrival:  Comments: EMS- abdominal pain, n/v/d

## 2014-03-21 NOTE — ED Notes (Signed)
Pt states she has had abdominal pain, n/v/d since yesterday. Pt states she couldn't keep count of how much she vomited in past 24 hours.

## 2014-03-21 NOTE — ED Notes (Signed)
Pt c/o generalized abdominal pain x 2 days, n/v.

## 2014-03-21 NOTE — H&P (Signed)
History and Physical  Mary [name] CBS:[id number] DOB: 1970/11/22 DOA: 03/21/2014  Referring physician: Malachy Moan, ER physician PCP: Angelica Chessman, MD   Chief Complaint: Nausea and vomiting  HPI: Mary [name] is a 43 y.o. female  Patient is a 43 year old female past medical history of very poorly controlled diabetes mellitus with secondary gastroparesis as well as bipolar disorder, seizure disorder who has had quite a number of hospitalizations in the past year alone for nausea and vomiting secondary to gastroparesis and oftentimes secondary DKA. Patient presented to the emergency room today with abdominal pain and nausea and vomiting x24 hours.  She was noted to have a anion gap of 25, blood sugar of 388 and a white blood cell count 15.7. She started on IV fluids plus insulin drip here in hospitalists were called for further evaluation and admission. Abdominal x-ray was unrevealing.     Review of Systems:  Patient seen after arrival to floor. Pt complains of fatigue, nausea. Some mild abdominal pain, although improved..  Pt denies any headaches, vision changes, dysphagia, chest pain, palpitations, shortness of breath, wheeze, cough, hematuria, dysuria, constipation or diarrhea, focal extremity numbness weakness or pain..  Review of systems are otherwise negative  Past Medical History  Diagnosis Date  . Kidney stone   . Seizure disorder     started with pregnancy of first son  . Gastritis   . Bipolar 2 disorder   . Post traumatic stress disorder     "flipping out" after people close to her died  . Heart murmur   . Arthritis     r ankle-S/P surgery (2007)  . Anemia     childhood  . Depression     childhood  . Hepatitis C     biopsy in 2010-no rx-was supposed to see a hepatologist in [place].  With cirrhosis  . Neuropathy     in legs and feet and hands  . Cataracts, bilateral   . Scoliosis   . Diabetes mellitus     diagnosed in 1996-always been on insulin    . DKA (diabetic ketoacidoses)     Recurrent admissions for DKA, medication non-complaince, poor social situation  . Diabetic neuropathy, type I diabetes mellitus     numbness bilaterally feet  . HTN (hypertension) 10/12/2013   Past Surgical History  Procedure Laterality Date  . Ankle surgery  2007  . Endometrial biopsy  06/22/2012  . Cholecystectomy  04/07/2013  . Cholecystectomy N/A 04/07/2013    Procedure: LAPAROSCOPIC CHOLECYSTECTOMY WITH INTRAOPERATIVE CHOLANGIOGRAM;  Surgeon: Adin Hector, MD;  Location: [facility] ORS;  Service: General;  Laterality: N/A;  . Cesarean section      3 times   Social History:  reports that she has been smoking Cigarettes.  She has a 2.5 pack-year smoking history. She has never used smokeless tobacco. She reports that she uses illicit drugs (Marijuana) about once per week. She reports that she does not drink alcohol. Patient lives at home by herself & is able to participate in activities of daily living without assistance normal  Allergies  Allergen Reactions  . Penicillins Rash    Family History  Problem Relation Age of Onset  . Diabetes Mother     currently 21  . Fibromyalgia Mother   . Cirrhosis Father     died in 74  . Diabetes Maternal Grandmother   . Diabetes Maternal Aunt     Confirmed with patient  Prior to Admission medications   Medication Sig Start Date End  Date Taking? Authorizing Provider  aspirin EC 81 MG tablet Take 81 mg by mouth daily.   Yes Historical Provider, MD  divalproex (DEPAKOTE) 250 MG DR tablet Take 250 mg by mouth 2 times daily at 12 noon and 4 pm.   Yes Historical Provider, MD  docusate sodium (COLACE) 100 MG capsule Take 200 mg by mouth daily as needed for mild constipation.   Yes Historical Provider, MD  gabapentin (NEURONTIN) 300 MG capsule Take 300 mg by mouth 3 (three) times daily.   Yes Historical Provider, MD  Insulin Glargine (LANTUS SOLOSTAR) 100 UNIT/ML Solostar Pen Inject 30 Units into the skin daily at  10 pm.   Yes Historical Provider, MD  insulin NPH-regular Human (NOVOLIN 70/30) (70-30) 100 UNIT/ML injection Inject 15 Units into the skin 2 (two) times daily with a meal.   Yes Historical Provider, MD  metoCLOPramide (REGLAN) 10 MG tablet Take 10 mg by mouth 3 (three) times daily before meals. 03/10/14  Yes Janece Canterbury, MD  estradiol cypionate (DEPO-ESTRADIOL) 5 MG/ML injection Inject into the muscle every 28 (twenty-eight) days.    Historical Provider, MD  HYDROcodone-acetaminophen (NORCO/VICODIN) 5-325 MG per tablet Take 1-2 tablets by mouth every 4 (four) hours as needed for moderate pain or severe pain. 03/10/14   Janece Canterbury, MD  hyoscyamine (LEVSIN SL) 0.125 MG SL tablet Place 0.125 mg under the tongue every 4 (four) hours as needed for cramping.    Historical Provider, MD  medroxyPROGESTERone (DEPO-PROVERA) 150 MG/ML injection Inject 150 mg into the muscle every 3 (three) months.    Historical Provider, MD  ondansetron (ZOFRAN) 4 MG tablet Take 1 tablet (4 mg total) by mouth every 6 (six) hours as needed for nausea. 03/10/14   Janece Canterbury, MD  pantoprazole (PROTONIX) 40 MG tablet Take 40 mg by mouth daily.    Historical Provider, MD  polyethylene glycol (MIRALAX / GLYCOLAX) packet Take 17 g by mouth daily as needed for mild constipation.    Historical Provider, MD  potassium chloride SA (K-DUR,KLOR-CON) 20 MEQ tablet Take 20 mEq by mouth daily.    Historical Provider, MD  risperiDONE (RISPERDAL) 2 MG tablet Take 2 mg by mouth at bedtime.    Historical Provider, MD    Physical Exam: BP 118/56  Pulse 112  Temp(Src) 98 F (36.7 C) (Oral)  Resp 17  SpO2 99%  General:  Alert and oriented x3, fatigue Eyes: Sclera nonicteric, extraocular movements are intact ENT: Normocephalic, atraumatic, mucous membranes are dry Neck: No JVD Cardiovascular: Regular rhythm, borderline tachycardia Respiratory: Clear to auscultation bilaterally Abdomen: Soft, non-distended, nonspecific  generalized tenderness, hypoactive bowel sounds Skin: No skin breaks, tears or lesions Musculoskeletal: No clubbing or cyanosis or edema Psychiatric: Patient is appropriate, no evidence of psychoses Neurologic: No overt deficits other than gastroparesis           Labs on Admission:  Basic Metabolic Panel:  Recent Labs Lab 03/21/14 1141  NA 141  K 4.2  CL 96  CO2 20  GLUCOSE 388*  BUN 24*  CREATININE 0.66  CALCIUM 10.5   Liver Function Tests:  Recent Labs Lab 03/21/14 1141  AST 43*  ALT 43*  ALKPHOS 101  BILITOT 0.4  PROT 9.1*  ALBUMIN 4.5    Recent Labs Lab 03/21/14 1141  LIPASE 14   No results found for this basename: AMMONIA,  in the last 168 hours CBC:  Recent Labs Lab 03/21/14 1141  WBC 15.7*  NEUTROABS 12.2*  HGB 12.9  HCT 40.7  MCV 71.4*  PLT 265   Cardiac Enzymes:  Recent Labs Lab 03/21/14 1141  TROPONINI <0.30    BNP (last 3 results) No results found for this basename: PROBNP,  in the last 8760 hours CBG:  Recent Labs Lab 03/21/14 1106 03/21/14 1337 03/21/14 1617 03/21/14 1738  GLUCAP 356* 303* 298* 230*    Radiological Exams on Admission: Dg Abd Acute W/chest  03/21/2014    IMPRESSION: Bowel gas pattern unremarkable. No demonstrable obstruction or free air.   Electronically Signed   By: Lowella Grip M.D.   On: 03/21/2014 14:47    EKG: Independently reviewed. Normal sinus rhythm with short PR interval  Assessment/Plan Present on Admission:  . DKA (diabetic ketoacidoses): Suspect to poor by mouth intake plus gastroparesis and likely secondary to starvation ketosis. IV fluids plus insulin. Have asked for case management consult for care plan this patient has had a large number of admissions due to recurrent problem.  Minimize narcotics and likely recommended no discharge on narcotics as this can prolong her problem  . Leukocytosis: No obvious signs of infection.  . Tobacco abuse: Patient counseled to quit  . GERD  (gastroesophageal reflux disease): On IV PPI  . Bipolar 1 disorder: Appears stable, may be playing a role in patient's recurrent admissions and compliance  . HTN (hypertension): Monitor closely, currently intravascularly volume depleted   . Protein-calorie malnutrition, severe: Nutrition to see  . Gastroparesis due to DM: Have started IV Reglan  . DM (diabetes mellitus), type 1, uncontrolled: As above, will check A1 C. if  . Microcytic anemia: Secondary to chronic disease  Consultants: None  Code Status: Full code  Family Communication: Tried to leave message with mother, unable to reach her   Disposition Plan: Here until DKA resolved. Would recommend  Time spent: 35 minutes  [facility] Pager [phone]

## 2014-03-21 NOTE — ED Provider Notes (Signed)
CSN: [id number]     Arrival date & time 03/21/14  1044 History   First MD Initiated Contact with Patient 03/21/14 1048     Chief Complaint  Patient presents with  . Abdominal Pain     (Consider location/radiation/quality/duration/timing/severity/associated sxs/prior Treatment) HPI Comments: Patient presents to the ER for evaluation of abdominal pain with nausea and vomiting. Patient reports her symptoms began yesterday. She reports diffuse abdominal pain which is cramping in nature. She has had nausea and vomiting, has not been able to hold anything down. Symptoms have been persistent since yesterday.  Patient is a 43 y.o. female presenting with abdominal pain.  Abdominal Pain Associated symptoms: nausea and vomiting     Past Medical History  Diagnosis Date  . Kidney stone   . Seizure disorder     started with pregnancy of first son  . Gastritis   . Bipolar 2 disorder   . Post traumatic stress disorder     "flipping out" after people close to her died  . Heart murmur   . Arthritis     r ankle-S/P surgery (2007)  . Anemia     childhood  . Depression     childhood  . Hepatitis C     biopsy in 2010-no rx-was supposed to see a hepatologist in [place].  With cirrhosis  . Neuropathy     in legs and feet and hands  . Cataracts, bilateral   . Scoliosis   . Diabetes mellitus     diagnosed in 1996-always been on insulin  . DKA (diabetic ketoacidoses)     Recurrent admissions for DKA, medication non-complaince, poor social situation  . Diabetic neuropathy, type I diabetes mellitus     numbness bilaterally feet  . HTN (hypertension) 10/12/2013   Past Surgical History  Procedure Laterality Date  . Ankle surgery  2007  . Endometrial biopsy  06/22/2012  . Cholecystectomy  04/07/2013  . Cholecystectomy N/A 04/07/2013    Procedure: LAPAROSCOPIC CHOLECYSTECTOMY WITH INTRAOPERATIVE CHOLANGIOGRAM;  Surgeon: Adin Hector, MD;  Location: [facility];  Service: General;  Laterality:  N/A;  . Cesarean section      3 times   Family History  Problem Relation Age of Onset  . Diabetes Mother     currently 59  . Fibromyalgia Mother   . Cirrhosis Father     died in 11  . Diabetes Maternal Grandmother   . Diabetes Maternal Aunt    History  Substance Use Topics  . Smoking status: Current Every Day Smoker -- 0.10 packs/day for 25 years    Types: Cigarettes  . Smokeless tobacco: Never Used  . Alcohol Use: No     Comment: Endorses hasn't been drinking   OB History   Grav Para Term Preterm Abortions TAB SAB Ect Mult Living   4 3 3  0 1 0 1 0 1 2     Review of Systems  Gastrointestinal: Positive for nausea, vomiting and abdominal pain.  All other systems reviewed and are negative.     Allergies  Penicillins  Home Medications   Prior to Admission medications   Medication Sig Start Date End Date Taking? Authorizing Provider  aspirin EC 81 MG tablet Take 81 mg by mouth daily.   Yes Historical Provider, MD  divalproex (DEPAKOTE) 250 MG DR tablet Take 250 mg by mouth 2 times daily at 12 noon and 4 pm.   Yes Historical Provider, MD  docusate sodium (COLACE) 100 MG capsule Take 200 mg by mouth  daily as needed for mild constipation.   Yes Historical Provider, MD  gabapentin (NEURONTIN) 300 MG capsule Take 300 mg by mouth 3 (three) times daily.   Yes Historical Provider, MD  Insulin Glargine (LANTUS SOLOSTAR) 100 UNIT/ML Solostar Pen Inject 30 Units into the skin daily at 10 pm.   Yes Historical Provider, MD  insulin NPH-regular Human (NOVOLIN 70/30) (70-30) 100 UNIT/ML injection Inject 15 Units into the skin 2 (two) times daily with a meal.   Yes Historical Provider, MD  estradiol cypionate (DEPO-ESTRADIOL) 5 MG/ML injection Inject into the muscle every 28 (twenty-eight) days.    Historical Provider, MD  HYDROcodone-acetaminophen (NORCO/VICODIN) 5-325 MG per tablet Take 1-2 tablets by mouth every 4 (four) hours as needed for moderate pain or severe pain. 03/10/14    Janece Canterbury, MD  hyoscyamine (LEVSIN SL) 0.125 MG SL tablet Place 0.125 mg under the tongue every 4 (four) hours as needed for cramping.    Historical Provider, MD  medroxyPROGESTERone (DEPO-PROVERA) 150 MG/ML injection Inject 150 mg into the muscle every 3 (three) months.    Historical Provider, MD  metoCLOPramide (REGLAN) 10 MG tablet Take 1 tablet (10 mg total) by mouth 4 (four) times daily -  before meals and at bedtime. 03/10/14   Janece Canterbury, MD  ondansetron (ZOFRAN) 4 MG tablet Take 1 tablet (4 mg total) by mouth every 6 (six) hours as needed for nausea. 03/10/14   Janece Canterbury, MD  pantoprazole (PROTONIX) 40 MG tablet Take 40 mg by mouth daily.    Historical Provider, MD  polyethylene glycol (MIRALAX / GLYCOLAX) packet Take 17 g by mouth daily as needed for mild constipation.    Historical Provider, MD  potassium chloride SA (K-DUR,KLOR-CON) 20 MEQ tablet Take 20 mEq by mouth daily.    Historical Provider, MD  risperiDONE (RISPERDAL) 2 MG tablet Take 2 mg by mouth at bedtime.    Historical Provider, MD   BP 118/56  Pulse 95  Temp(Src) 98 F (36.7 C) (Oral)  Resp 16  SpO2 96% Physical Exam  Constitutional: She is oriented to person, place, and time. She appears well-developed and well-nourished. No distress.  HENT:  Head: Normocephalic and atraumatic.  Right Ear: Hearing normal.  Left Ear: Hearing normal.  Nose: Nose normal.  Mouth/Throat: Oropharynx is clear and moist and mucous membranes are normal.  Eyes: Conjunctivae and EOM are normal. Pupils are equal, round, and reactive to light.  Neck: Normal range of motion. Neck supple.  Cardiovascular: Regular rhythm, S1 normal and S2 normal.  Exam reveals no gallop and no friction rub.   No murmur heard. Pulmonary/Chest: Effort normal and breath sounds normal. No respiratory distress. She exhibits no tenderness.  Abdominal: Soft. Normal appearance and bowel sounds are normal. There is no hepatosplenomegaly. There is  generalized tenderness. There is no rebound, no guarding, no tenderness at McBurney's point and negative Murphy's sign. No hernia.  Musculoskeletal: Normal range of motion.  Neurological: She is alert and oriented to person, place, and time. She has normal strength. No cranial nerve deficit or sensory deficit. Coordination normal. GCS eye subscore is 4. GCS verbal subscore is 5. GCS motor subscore is 6.  Skin: Skin is warm, dry and intact. No rash noted. No cyanosis.  Psychiatric: She has a normal mood and affect. Her speech is normal and behavior is normal. Thought content normal.    ED Course  Procedures (including critical care time) Labs Review Labs Reviewed  CBC WITH DIFFERENTIAL - Abnormal; Notable for the  following:    WBC 15.7 (*)    RBC 5.70 (*)    MCV 71.4 (*)    MCH 22.6 (*)    Neutrophils Relative % 78 (*)    Neutro Abs 12.2 (*)    All other components within normal limits  COMPREHENSIVE METABOLIC PANEL - Abnormal; Notable for the following:    Glucose, Bld 388 (*)    BUN 24 (*)    Total Protein 9.1 (*)    AST 43 (*)    ALT 43 (*)    Anion gap 25 (*)    All other components within normal limits  URINALYSIS, ROUTINE W REFLEX MICROSCOPIC - Abnormal; Notable for the following:    Specific Gravity, Urine 1.041 (*)    Glucose, UA >1000 (*)    Ketones, ur >80 (*)    All other components within normal limits  URINE RAPID DRUG SCREEN (HOSP PERFORMED) - Abnormal; Notable for the following:    Tetrahydrocannabinol POSITIVE (*)    All other components within normal limits  KETONES, QUALITATIVE - Abnormal; Notable for the following:    Acetone, Bld SMALL (*)    All other components within normal limits  BLOOD GAS, ARTERIAL - Abnormal; Notable for the following:    pCO2 arterial 32.6 (*)    Bicarbonate 18.9 (*)    Acid-base deficit 4.8 (*)    All other components within normal limits  I-STAT CG4 LACTIC ACID, ED - Abnormal; Notable for the following:    Lactic Acid, Venous  2.56 (*)    All other components within normal limits  CBG MONITORING, ED - Abnormal; Notable for the following:    Glucose-Capillary 356 (*)    All other components within normal limits  CBG MONITORING, ED - Abnormal; Notable for the following:    Glucose-Capillary 303 (*)    All other components within normal limits  LIPASE, BLOOD  TROPONIN I  URINE MICROSCOPIC-ADD ON  POC URINE PREG, ED    Imaging Review Dg Abd Acute W/chest  03/21/2014   CLINICAL DATA:  Abdominal pain and nausea; seizure disorder  EXAM: ACUTE ABDOMEN SERIES (ABDOMEN 2 VIEW & CHEST 1 VIEW)  COMPARISON:  Chest radiograph December 23, 2013; CT abdomen and pelvis March 08, 2014  FINDINGS: PA chest: Lungs are clear. Heart size and pulmonary vascularity are normal. No adenopathy. There is thoracolumbar levoscoliosis.  Supine and left lateral decubitus abdomen images: The bowel gas pattern is unremarkable. No obstruction or free air. There is lumbar levoscoliosis.  IMPRESSION: Bowel gas pattern unremarkable. No demonstrable obstruction or free air.   Electronically Signed   By: Lowella Grip M.D.   On: 03/21/2014 14:47     EKG Interpretation   Date/Time:  Monday March 21 2014 11:02:57 EDT Ventricular Rate:  76 PR Interval:  117 QRS Duration: 68 QT Interval:  413 QTC Calculation: 464 R Axis:   78 Text Interpretation:  Sinus rhythm Borderline short PR interval No  significant change since last tracing Confirmed by Wladyslaw Henrichs  MD,  [place] [phone]) on 03/21/2014 1:09:22 PM      MDM   Final diagnoses:  None   gastroparesis  Intractable vomiting  Abdominal pain  DKA  Patient presents to the ER for evaluation of increased abdominal pain with nausea and vomiting. Symptoms began yesterday, continued through the night. Patient complaining of diffuse abdominal pain. Reviewing her records reveals previous presentations with similar symptoms. She was here 2 weeks ago with a similar process, CT scan at that time  did  not show any acute abnormality. Patient was admitted for treatment of mild DKA with gastroparesis.  Picture is similar again today. Patient does have small serum ketones. She has a gap of 25. ABG, however, does not show significant acidosis. Patient's lipase is normal. LFTs are essentially normal for her, at her baseline. EKG was unchanged from previous, troponin was negative. Glucose was around 400 arousals. She was administered IV fluids with some improvement, glucose now 300.  Patient significant nausea, vomiting was treated with Zofran and Reglan. She was given Tylenol for pain.    Orpah Greek, MD 03/21/14 [phone]

## 2014-03-21 NOTE — ED Notes (Signed)
Elevated lactic acid given to Dr Betsey Holiday.Marland Kitchenklj

## 2014-03-22 LAB — CBC
HCT: 34.6 % — ABNORMAL LOW (ref 36.0–46.0)
Hemoglobin: 10.9 g/dL — ABNORMAL LOW (ref 12.0–15.0)
MCH: 22.8 pg — ABNORMAL LOW (ref 26.0–34.0)
MCHC: 31.5 g/dL (ref 30.0–36.0)
MCV: 72.2 fL — AB (ref 78.0–100.0)
PLATELETS: 255 10*3/uL (ref 150–400)
RBC: 4.79 MIL/uL (ref 3.87–5.11)
RDW: 14.7 % (ref 11.5–15.5)
WBC: 15.5 10*3/uL — AB (ref 4.0–10.5)

## 2014-03-22 LAB — BASIC METABOLIC PANEL
Anion gap: 17 — ABNORMAL HIGH (ref 5–15)
Anion gap: 13 (ref 5–15)
BUN: 20 mg/dL (ref 6–23)
BUN: 15 mg/dL (ref 6–23)
CALCIUM: 9.1 mg/dL (ref 8.4–10.5)
CO2: 18 mEq/L — ABNORMAL LOW (ref 19–32)
CO2: 18 mEq/L — ABNORMAL LOW (ref 19–32)
CREATININE: 0.44 mg/dL — AB (ref 0.50–1.10)
Calcium: 8.3 mg/dL — ABNORMAL LOW (ref 8.4–10.5)
Chloride: 105 mEq/L (ref 96–112)
Chloride: 101 mEq/L (ref 96–112)
Creatinine, Ser: 0.5 mg/dL (ref 0.50–1.10)
GFR calc non Af Amer: >90 mL/min (ref >90)
GLUCOSE: 153 mg/dL — AB (ref 70–99)
Glucose, Bld: 144 mg/dL — ABNORMAL HIGH (ref 70–99)
Potassium: 4.2 mEq/L (ref 3.7–5.3)
Potassium: 4.6 mEq/L (ref 3.7–5.3)
SODIUM: 140 meq/L (ref 137–147)
Sodium: 132 mEq/L — ABNORMAL LOW (ref 137–147)

## 2014-03-22 LAB — GLUCOSE, CAPILLARY
Glucose-Capillary: 170 mg/dL — ABNORMAL HIGH (ref 70–99)
Glucose-Capillary: 121 mg/dL — ABNORMAL HIGH (ref 70–99)
Glucose-Capillary: 114 mg/dL — ABNORMAL HIGH (ref 70–99)
Glucose-Capillary: 183 mg/dL — ABNORMAL HIGH (ref 70–99)

## 2014-03-22 MED ORDER — GLUCERNA SHAKE PO LIQD
237.0000 mL | Freq: Two times a day (BID) | ORAL | Status: DC
  Administered 2014-03-22: 237 mL via ORAL
  Filled 2014-03-22: qty 237

## 2014-03-22 MED ORDER — SODIUM CHLORIDE 0.9 % IV BOLUS (SEPSIS)
1000.0000 mL | Freq: Once | INTRAVENOUS | Status: AC
  Administered 2014-03-22: 1000 mL via INTRAVENOUS

## 2014-03-22 MED ORDER — METOCLOPRAMIDE HCL 10 MG PO TABS: 10.0000 mg | ORAL_TABLET | Freq: Three times a day (TID) | ORAL | Status: AC

## 2014-03-22 MED ORDER — SODIUM CHLORIDE 0.9 % IV SOLN
1000.0000 mL | INTRAVENOUS | Status: DC
Start: 2014-03-22 — End: 2014-03-22
  Administered 2014-03-22: 1000 mL via INTRAVENOUS

## 2014-03-22 MED ORDER — INSULIN DETEMIR 100 UNIT/ML ~~LOC~~ SOLN: 30.0000 [IU] | Freq: Every day | SUBCUTANEOUS | Status: DC

## 2014-03-22 NOTE — Progress Notes (Signed)
  CARE MANAGEMENT ED NOTE 03/22/2014  Patient:  JAMIELYN, PETRUCCI   Account Number:  [id number]  Date Initiated:  03/22/2014  Documentation initiated by:  Jackelyn Poling  Subjective/Objective Assessment:   43 yr old medicaid potential/self pay [place] pt with [facility] Admissions x 10 and ED visits x 3 in last 6 months     Subjective/Objective Assessment Detail:   Pcp [facility] Dr Feliciana Rossetti  PMH bipolar 2 disorder, DKA, DM, DM neuropathy, HTN, anemia, scoliosis     Action/Plan:   ED CM spoke with unit CM, Vinnie Level about EPIC noted [facility] Admissions x 10 and ED visits x 3 in last 6 months Sent email to Dr Baruch Goldmann EDP staff, Made other recommendations (referral to [facility] SW, Pt conference)   Action/Plan Detail:   Anticipated DC Date:       Status Recommendation to Physician:   Result of Recommendation:    Other ED Services  Consult Working [place]  Other  Outpatient Services - Pt will follow up    Choice offered to / List presented to:            Status of service:  Completed, signed off  ED Comments:   ED Comments Detail:

## 2014-03-22 NOTE — Progress Notes (Signed)
Patient is alert and oriented, vital signs are stable, patient is tolerating her diet, discharge instructions reviewed with patient and prescriptions given, pt [facility] with [facility] Neta Mends RN 03-22-2014 17:50pm

## 2014-03-22 NOTE — Discharge Summary (Signed)
Mary [name], 43 y.o., DOB 1971-04-10, MRN [id number]. Admission date: 03/21/2014 Discharge Date 03/22/2014 Primary MD [name], NP Admitting Physician Annita Brod, MD  Admission Diagnosis  DKA (diabetic ketoacidoses)   Discharge Diagnosis   Principal Problem:   DKA (diabetic ketoacidoses) Active Problems:   Seizure disorder   Microcytic anemia   Non compliance w medication regimen   Tobacco abuse   Bipolar 1 disorder   DM (diabetes mellitus), type 1, uncontrolled   Leukocytosis   Gastroparesis due to DM   GERD (gastroesophageal reflux disease)   HTN (hypertension)   Protein-calorie malnutrition, severe      Past Medical History  Diagnosis Date  . Kidney stone   . Seizure disorder     started with pregnancy of first son  . Gastritis   . Bipolar 2 disorder   . Post traumatic stress disorder     "flipping out" after people close to her died  . Heart murmur   . Arthritis     r ankle-S/P surgery (2007)  . Anemia     childhood  . Depression     childhood  . Hepatitis C     biopsy in 2010-no rx-was supposed to see a hepatologist in [place].  With cirrhosis  . Neuropathy     in legs and feet and hands  . Cataracts, bilateral   . Scoliosis   . Diabetes mellitus     diagnosed in 1996-always been on insulin  . DKA (diabetic ketoacidoses)     Recurrent admissions for DKA, medication non-complaince, poor social situation  . Diabetic neuropathy, type I diabetes mellitus     numbness bilaterally feet  . HTN (hypertension) 10/12/2013    Past Surgical History  Procedure Laterality Date  . Ankle surgery  2007  . Endometrial biopsy  06/22/2012  . Cholecystectomy  04/07/2013  . Cholecystectomy N/A 04/07/2013    Procedure: LAPAROSCOPIC CHOLECYSTECTOMY WITH INTRAOPERATIVE CHOLANGIOGRAM;  Surgeon: Adin Hector, MD;  Location: [facility];  Service: General;  Laterality: N/A;  . Cesarean section      3 times   HPI: Mary [name] is a 43 y.o. female  Patient is a  43 year old female past medical history of very poorly controlled diabetes mellitus with secondary gastroparesis as well as bipolar disorder, seizure disorder who has had quite a number of hospitalizations in the past year alone for nausea and vomiting secondary to gastroparesis and oftentimes secondary DKA. Patient presented to the emergency room today with abdominal pain and nausea and vomiting x24 hours.  She was noted to have a anion gap of 25, blood sugar of 388 and a white blood cell count 15.7. She started on IV fluids plus insulin drip here in hospitalists were called for further evaluation and admission. Abdominal x-ray was unrevealing.  Patient and her gap closed by yesterday evening, was transitioned to subcutaneous Lantus, as well and given her known history of gastroparesis she was started on IV Reglan 10 mg oral 4 times a day before meals, she had no further episodes of vomiting during hospitalization, nausea much improved, she had half of her breakfast, she had most of have lunch, at this point denies any abdomen pain, any nausea, any vomiting, and she wants to go home, patient reports she does have levemir and novolin 70/30 ,ready for her to pick up at the pharmacy,She has an appointment scheduled with: [facility] in one week.  Hospital Course See H&P, Labs, Consult and Test reports for all details  in brief, patient was admitted for **  Principal Problem:   DKA (diabetic ketoacidoses) Active Problems:   Seizure disorder   Microcytic anemia   Non compliance w medication regimen   Tobacco abuse   Bipolar 1 disorder   DM (diabetes mellitus), type 1, uncontrolled   Leukocytosis   Gastroparesis due to DM   GERD (gastroesophageal reflux disease)   HTN (hypertension)   Protein-calorie malnutrition, severe  DM with poorly controlled type 1 diabetes mellitus  - in the setting of poor compliance, further worsened with nausea and vomiting. Multiple hospitalizations for DKA  and multiple ED visits. . Anion gap Closed with IV fluids and insulin drip.  -Last A1c of 13.2. Patient has been following a Database administrator. Patient reports she has levemir 30 units daily and novolog 70/30 15 units bid Remicade for her to pick up the pharmacy. -Patient was counseled at length about compliance, and severe side effects of DKA, and uncontrolled diabetes mellitus .  Nausea / vomiting and abdominal pain /gastroparesis Secondary to DKA and underlying diabetic gastroparesis. Tolerating diet. Finished Most of her lunch ,abdominal pain resolved. .  continue when necessary Reglan   Leukocytosis -Stress related from nausea vomiting and DKA, febrile, negative urine analysis  Seizure disorder  Continue Depakote   Bipolar disorder  Continue risperidone      Significant Tests:  See full reports for all details    Ct Abdomen Pelvis W Contrast  03/08/2014   CLINICAL DATA:  Diffuse abdominal pain, vomiting.  EXAM: CT ABDOMEN AND PELVIS WITH CONTRAST  TECHNIQUE: Multidetector CT imaging of the abdomen and pelvis was performed using the standard protocol following bolus administration of intravenous contrast.  CONTRAST:  127mL OMNIPAQUE IOHEXOL 300 MG/ML  SOLN  COMPARISON:  CT scan of June 26, 2013.  FINDINGS: Visualized lung bases appear normal. No significant osseous abnormality is noted.  Status post cholecystectomy. No focal abnormality seen in the liver, spleen or pancreas. Adrenal glands and kidneys appear normal. No hydronephrosis or renal obstruction is noted. No renal or ureteral calculi are noted. There is no evidence of bowel obstruction. The appendix appears normal. Urinary bladder and uterus appear normal. The ovaries appear normal. No abnormal fluid collection is noted. No significant adenopathy is noted.  IMPRESSION: No significant abnormality seen in the abdomen or pelvis.   Electronically Signed   By: Sabino Dick M.D.   On: 03/08/2014 11:43   Dg Abd Acute  W/chest  03/21/2014   CLINICAL DATA:  Abdominal pain and nausea; seizure disorder  EXAM: ACUTE ABDOMEN SERIES (ABDOMEN 2 VIEW & CHEST 1 VIEW)  COMPARISON:  Chest radiograph December 23, 2013; CT abdomen and pelvis March 08, 2014  FINDINGS: PA chest: Lungs are clear. Heart size and pulmonary vascularity are normal. No adenopathy. There is thoracolumbar levoscoliosis.  Supine and left lateral decubitus abdomen images: The bowel gas pattern is unremarkable. No obstruction or free air. There is lumbar levoscoliosis.  IMPRESSION: Bowel gas pattern unremarkable. No demonstrable obstruction or free air.   Electronically Signed   By: Lowella Grip M.D.   On: 03/21/2014 14:47     Today   Subjective:   Mary [name] today has no headache,no chest  pain,no new weakness tingling or numbness, reports nausea is much improved, no vomiting, abdomen pain has resolved, Ate most of her lunch, she wants to go home today.  Objective:   Blood pressure 121/65, pulse 74, temperature 98.5 F (36.9 C), temperature source Oral, resp. rate 18, height 5'  4" (1.626 m), weight 52.028 kg (114 lb 11.2 oz), SpO2 99.00%.  Intake/Output Summary (Last 24 hours) at 03/22/14 1717 Last data filed at 03/22/14 1413  Gross per 24 hour  Intake 1618.33 ml  Output   1100 ml  Net 518.33 ml    Exam Awake Alert, Oriented *3, No new F.N deficits, Normal affect [place].AT,PERRAL Supple Neck,No JVD, No cervical lymphadenopathy appriciated.  Symmetrical Chest wall movement, Good air movement bilaterally, CTAB RRR,No Gallops,Rubs or new Murmurs, No Parasternal Heave +ve B.Sounds, Abd Soft, Non tender, No organomegaly appriciated, No rebound -guarding or rigidity. No Cyanosis, Clubbing or edema, No new Rash or bruise  Data Review     CBC w Diff: Lab Results  Component Value Date   WBC 15.5* 03/22/2014   HGB 10.9* 03/22/2014   HCT 34.6* 03/22/2014   PLT 255 03/22/2014   LYMPHOPCT 19 03/21/2014   MONOPCT 3 03/21/2014   EOSPCT 0  03/21/2014   BASOPCT 0 03/21/2014   CMP: Lab Results  Component Value Date   NA 132* 03/22/2014   K 4.6 03/22/2014   CL 101 03/22/2014   CO2 18* 03/22/2014   BUN 15 03/22/2014   CREATININE 0.44* 03/22/2014   PROT 9.1* 03/21/2014   ALBUMIN 4.5 03/21/2014   BILITOT 0.4 03/21/2014   ALKPHOS 101 03/21/2014   AST 43* 03/21/2014   ALT 43* 03/21/2014  .  Micro Results No results found for this or any previous visit (from the past 240 hour(s)).   Discharge Instructions     Patient is encouraged to eat small amounts of meals, more frequent, to drink plenty of fluids, and to eat the Glucerna if she can tolerate any solid . Follow-up Information   Follow up with [facility] On 03/29/2014. (Appointment to see PCP at 3:15 pm for hospital follow up and check diabetes. Pickup meds today at [place].)    Contact information:   [address] [place] [id number] [phone]      Discharge Medications     Medication List    STOP taking these medications       LANTUS SOLOSTAR 100 UNIT/ML Solostar Pen  Generic drug:  Insulin Glargine      TAKE these medications       aspirin EC 81 MG tablet  Take 81 mg by mouth daily.     divalproex 250 MG DR tablet  Commonly known as:  DEPAKOTE  Take 250 mg by mouth 2 times daily at 12 noon and 4 pm.     docusate sodium 100 MG capsule  Commonly known as:  COLACE  Take 200 mg by mouth daily as needed for mild constipation.     estradiol cypionate 5 MG/ML injection  Commonly known as:  DEPO-ESTRADIOL  Inject into the muscle every 28 (twenty-eight) days.     gabapentin 300 MG capsule  Commonly known as:  NEURONTIN  Take 300 mg by mouth 3 (three) times daily.     HYDROcodone-acetaminophen 5-325 MG per tablet  Commonly known as:  NORCO/VICODIN  Take 1-2 tablets by mouth every 4 (four) hours as needed for moderate pain or severe pain.     hyoscyamine 0.125 MG SL tablet  Commonly known as:  LEVSIN SL   Place 0.125 mg under the tongue every 4 (four) hours as needed for cramping.     insulin detemir 100 UNIT/ML injection  Commonly known as:  LEVEMIR  Inject 0.3 mLs (30 Units total) into the skin  at bedtime.     insulin NPH-regular Human (70-30) 100 UNIT/ML injection  Commonly known as:  NOVOLIN 70/30  Inject 15 Units into the skin 2 (two) times daily with a meal.     medroxyPROGESTERone 150 MG/ML injection  Commonly known as:  DEPO-PROVERA  Inject 150 mg into the muscle every 3 (three) months.     metoCLOPramide 10 MG tablet  Commonly known as:  REGLAN  Take 1 tablet (10 mg total) by mouth 4 (four) times daily -  before meals and at bedtime.     ondansetron 4 MG tablet  Commonly known as:  ZOFRAN  Take 1 tablet (4 mg total) by mouth every 6 (six) hours as needed for nausea.     pantoprazole 40 MG tablet  Commonly known as:  PROTONIX  Take 40 mg by mouth daily.     polyethylene glycol packet  Commonly known as:  MIRALAX / GLYCOLAX  Take 17 g by mouth daily as needed for mild constipation.     potassium chloride SA 20 MEQ tablet  Commonly known as:  K-DUR,KLOR-CON  Take 20 mEq by mouth daily.     risperiDONE 2 MG tablet  Commonly known as:  RISPERDAL  Take 2 mg by mouth at bedtime.         Total Time in preparing paper work, data evaluation and todays exam - 35 minutes  Shaindy Reader M.D on 03/22/2014 at 5:17 PM  [facility]  [phone]

## 2014-03-22 NOTE — Discharge Instructions (Signed)
Follow with Primary MD [name], NP in 7 days  And [facility].  Get CBC, CMP, 2 view Chest X ray checked  by Primary MD next visit.    Activity: As tolerated with Full fall precautions use walker/cane & assistance as needed   Disposition Home    Diet: Carbohydrate modified, patient was encouraged to eat small amounts with meals, but more frequent, Inc. plenty of fluids, and keep well-hydrated, dual Glucerna supplement as well , with feeding assistance and aspiration precautions as needed.  For Heart failure patients - Check your Weight same time everyday, if you gain over 2 pounds, or you develop in leg swelling, experience more shortness of breath or chest pain, call your Primary MD immediately. Follow Cardiac Low Salt Diet and 1.8 lit/day fluid restriction.   On your next visit with her primary care physician please Get Medicines reviewed and adjusted.  Please request your Prim.MD to go over all Hospital Tests and Procedure/Radiological results at the follow up, please get all Hospital records sent to your Prim MD by signing hospital release before you go home.   If you experience worsening of your admission symptoms, develop shortness of breath, life threatening emergency, suicidal or homicidal thoughts you must seek medical attention immediately by calling 911 or calling your MD immediately  if symptoms less severe.  You Must read complete instructions/literature along with all the possible adverse reactions/side effects for all the Medicines you take and that have been prescribed to you. Take any new Medicines after you have completely understood and accpet all the possible adverse reactions/side effects.   Do not drive, operating heavy machinery, perform activities at heights, swimming or participation in water activities or provide baby sitting services if your were admitted for syncope or siezures until you have seen by Primary  MD or a Neurologist and advised to do so again.  Do not drive when taking Pain medications.    Do not take more than prescribed Pain, Sleep and Anxiety Medications  Special Instructions: If you have smoked or chewed Tobacco  in the last 2 yrs please stop smoking, stop any regular Alcohol  and or any Recreational drug use.  Wear Seat belts while driving.   Please note  You were cared for by a hospitalist during your hospital stay. If you have any questions about your discharge medications or the care you received while you were in the hospital after you are discharged, you can call the unit and asked to speak with the hospitalist on call if the hospitalist that took care of you is not available. Once you are discharged, your primary care physician will handle any further medical issues. Please note that NO REFILLS for any discharge medications will be authorized once you are discharged, as it is imperative that you return to your primary care physician (or establish a relationship with a primary care physician if you do not have one) for your aftercare needs so that they can reassess your need for medications and monitor your lab values.

## 2014-03-22 NOTE — Progress Notes (Signed)
INITIAL NUTRITION ASSESSMENT  Pt meets criteria for severe MALNUTRITION in the context of chronic illness as evidenced by 12% weight loss in the past 2 months with <75% estimated energy intake in the past month.  DOCUMENTATION CODES Per approved criteria  -Severe malnutrition in the context of chronic illness   INTERVENTION: - Glucerna shakes BID - Educated pt on diabetic diet, pt didn't seem to grasp important concept of portion sizes of CHO foods which was stressed with pt. Diabetic diet handouts provided with RD contact information.  - RD to continue to monitor   NUTRITION DIAGNOSIS: Increased nutrient needs related to unintended weight loss as evidenced by weight trend.   Goal: Pt to consume >90% of meals/supplements  Monitor:  Weights, labs, intake  Reason for Assessment: Consult for assessment   43 y.o. female  Admitting Dx: DKA (diabetic ketoacidoses)  ASSESSMENT: Pt with past medical history of very poorly controlled diabetes mellitus with secondary gastroparesis as well as bipolar disorder, seizure disorder who has had quite a number of hospitalizations in the past year alone for nausea and vomiting secondary to gastroparesis and oftentimes secondary DKA. Patient presented to the emergency room today with abdominal pain and nausea and vomiting x24 hours. Found to have diabetic ketoacidosis.   - Met with pt who reports good appetite at home, eating 3 small meals/day and has been trying to watch her portion sizes of CHO  - Friend reports pt has been eating a lot of pasta in the past 2 weeks, 2 cups of pasta at a time - Weight down 15 pound unintended weight loss in the past 2 months - During admission last month, PO intake was 25% of meals  - Has been followed by diabetic coordinator during admissions   Lab Results  Component Value Date   HGBA1C 13.2* 12/23/2013     Height: Ht Readings from Last 1 Encounters:  03/21/14 $RemoveB'5\' 4"'AABjkqTa$  (1.626 m)    Weight: Wt Readings from  Last 1 Encounters:  03/21/14 114 lb 11.2 oz (52.028 kg)    Ideal Body Weight: 120 lbs  % Ideal Body Weight: 95%  Wt Readings from Last 10 Encounters:  03/21/14 114 lb 11.2 oz (52.028 kg)  03/08/14 117 lb 1 oz (53.1 kg)  01/27/14 124 lb 12.8 oz (56.609 kg)  01/22/14 129 lb 10.1 oz (58.8 kg)  12/24/13 124 lb 1.9 oz (56.3 kg)  12/02/13 115 lb 15.4 oz (52.6 kg)  11/23/13 119 lb 14.9 oz (54.4 kg)  11/02/13 120 lb 13 oz (54.8 kg)  10/22/13 123 lb 10.9 oz (56.1 kg)  10/12/13 115 lb (52.164 kg)    Usual Body Weight: 129 lbs 2 months ago  % Usual Body Weight: 88%  BMI:  Body mass index is 19.68 kg/(m^2).  Estimated Nutritional Needs: Kcal: 1300-1500 Protein: 60-75g Fluid: 1.3-1.5L/day   Skin: intact  Diet Order: Carb Control  EDUCATION NEEDS: -Education needs addressed   Intake/Output Summary (Last 24 hours) at 03/22/14 1456 Last data filed at 03/22/14 1413  Gross per 24 hour  Intake 1618.33 ml  Output   1100 ml  Net 518.33 ml    Last BM: 10/4  Labs:   Recent Labs Lab 03/21/14 1141 03/21/14 1832 03/22/14 0520  NA 141 141 140  K 4.2 3.5* 4.2  CL 96 106 105  CO2 20 20 18*  BUN 24* 20 20  CREATININE 0.66 0.46* 0.50  CALCIUM 10.5 9.1 9.1  GLUCOSE 388* 200* 153*    CBG (last 3)  Recent Labs  03/22/14 0727 03/22/14 1217 03/22/14 1314  GLUCAP 170* 121* 114*    Scheduled Meds: . enoxaparin (LOVENOX) injection  40 mg Subcutaneous Q24H  . insulin aspart  0-15 Units Subcutaneous TID WC  . insulin aspart  0-5 Units Subcutaneous QHS  . insulin aspart  4 Units Subcutaneous TID WC  . insulin glargine  30 Units Subcutaneous QHS  . metoCLOPramide (REGLAN) injection  10 mg Intravenous 4 times per day  . pantoprazole (PROTONIX) IV  40 mg Intravenous Q24H    Continuous Infusions: . sodium chloride 1,000 mL (03/22/14 1026)  . insulin (NOVOLIN-R) infusion 3.8 Units/hr (03/21/14 1847)    Past Medical History  Diagnosis Date  . Kidney stone   . Seizure  disorder     started with pregnancy of first son  . Gastritis   . Bipolar 2 disorder   . Post traumatic stress disorder     "flipping out" after people close to her died  . Heart murmur   . Arthritis     r ankle-S/P surgery (2007)  . Anemia     childhood  . Depression     childhood  . Hepatitis C     biopsy in 2010-no rx-was supposed to see a hepatologist in [place].  With cirrhosis  . Neuropathy     in legs and feet and hands  . Cataracts, bilateral   . Scoliosis   . Diabetes mellitus     diagnosed in 1996-always been on insulin  . DKA (diabetic ketoacidoses)     Recurrent admissions for DKA, medication non-complaince, poor social situation  . Diabetic neuropathy, type I diabetes mellitus     numbness bilaterally feet  . HTN (hypertension) 10/12/2013    Past Surgical History  Procedure Laterality Date  . Ankle surgery  2007  . Endometrial biopsy  06/22/2012  . Cholecystectomy  04/07/2013  . Cholecystectomy N/A 04/07/2013    Procedure: LAPAROSCOPIC CHOLECYSTECTOMY WITH INTRAOPERATIVE CHOLANGIOGRAM;  Surgeon: Adin Hector, MD;  Location: [facility];  Service: General;  Laterality: N/A;  . Cesarean section      3 times    Carlis Stable MS, RD, LDN [phone] Pager [phone] Weekend/After Hours Pager

## 2014-03-22 NOTE — Progress Notes (Signed)
Inpatient Diabetes Program Recommendations  AACE/ADA: New Consensus Statement on Inpatient Glycemic Control (2013)  Target Ranges:  Prepandial:   less than 140 mg/dL      Peak postprandial:   less than 180 mg/dL (1-2 hours)      Critically ill patients:  140 - 180 mg/dL   Reason for Visit: 10th admission in last 6 months  Diabetes history: Type 1 Outpatient Diabetes medications: Levemir 30 units at HS, 70/30 15 units BID Current orders for Inpatient glycemic control: Lantus 30 units at HS, Novolog 4 units tid with meals, Moderate correction tid, HS Novolog correction scale   Note: Patient states her home Lantus was changed to Levemir.  Had run out of Levemir.  Still has 70/30 but could not take because she lives alone and had no one to help her take it when she got sick.  Patient states she had a $10 co-pay for meds at [facility].  She states she cannot afford this co-pay.  (Patient doesn't know what meds the co-pay was for but she cannot pay it.)  States that someone from the hospital came around and told her to go ahead and pick up her meds and she won't have to pay the $10.  Very difficult diabetes management situation.  Question safety of her living alone.   Thank you.  Sena Clouatre S. Marcelline Mates, RN, CNS, CDE Inpatient Diabetes Program, team pager [phone]

## 2014-03-22 NOTE — Care Management Note (Addendum)
    Page 1 of 1   03/22/2014     12:11:13 PM CARE MANAGEMENT NOTE 03/22/2014  Patient:  Mary [name], Mary [name]   Account Number:  [id number]  Date Initiated:  03/22/2014  Documentation initiated by:  Sunday Spillers  Subjective/Objective Assessment:   43 yo female admitted with DKA. PTA lived at home alone.     Action/Plan:   Home when stable   Anticipated DC Date:  03/22/2014   Anticipated DC Plan:  [place]  CM consult  Medication Assistance  Follow-up appt scheduled      Choice offered to / List presented to:             Status of service:  Completed, signed off Medicare Important Message given?   (If response is "NO", the following Medicare IM given date fields will be blank) Date Medicare IM given:   Medicare IM given by:   Date Additional Medicare IM given:   Additional Medicare IM given by:    Discharge Disposition:  HOME/SELF CARE  Per UR Regulation:  Reviewed for med. necessity/level of care/duration of stay  If discussed at [place] of Stay Meetings, dates discussed:    Comments:  03-22-14 Sunday Spillers RN CM 1100 Spoke with patient at beside. Discussed need for f/u after d/c and compliance with medication. Patient states she has meds at [place] [facility] but does not have the $10 to pay for them. Contacted the pharmacy and they confirmed meds were there and ready to be picked up. They stated they would wave copay for patient. Also contacted clinic for f/u appt, appt scheduled for 10/13 at 1515. Relayed this to patient, she was appreciative of assistance. Stressed importance of followup and compliance so she could stay out of the hospital. She was angry at time stating she can't live the way others do because she does not make enough money. Encouraged her to discuss financial issues with clinic staff and see if she would qualify for any programs. I also contacted the SW at the Clinic to follow her and assist with compliance. I spoke with ED CM and  she will flag for readmission.

## 2014-03-25 ENCOUNTER — Telehealth: Payer: Self-pay | Admitting: Emergency Medicine

## 2014-03-25 NOTE — Telephone Encounter (Signed)
Left message for pt to call nurse line to schedule visit to check CBG

## 2014-03-25 NOTE — Telephone Encounter (Signed)
Pt has scheduled f/u appointment with Mateo Flow next Tuesday. Informed pt we will see her at that time for CBG check

## 2014-03-29 ENCOUNTER — Other Ambulatory Visit: Payer: Self-pay

## 2014-03-29 ENCOUNTER — Inpatient Hospital Stay: Payer: Self-pay | Admitting: Internal Medicine

## 2014-04-04 ENCOUNTER — Inpatient Hospital Stay: Payer: Self-pay | Admitting: Internal Medicine

## 2014-04-05 ENCOUNTER — Encounter: Payer: Self-pay | Admitting: Internal Medicine

## 2014-04-05 ENCOUNTER — Ambulatory Visit: Payer: Medicaid Other | Attending: Internal Medicine | Admitting: Internal Medicine

## 2014-04-05 VITALS — BP 110/74 | HR 118 | Temp 99.4°F | Resp 16

## 2014-04-05 DIAGNOSIS — Z833 Family history of diabetes mellitus: Secondary | ICD-10-CM | POA: Insufficient documentation

## 2014-04-05 DIAGNOSIS — E1065 Type 1 diabetes mellitus with hyperglycemia: Secondary | ICD-10-CM | POA: Insufficient documentation

## 2014-04-05 DIAGNOSIS — Z720 Tobacco use: Secondary | ICD-10-CM | POA: Diagnosis not present

## 2014-04-05 DIAGNOSIS — F17210 Nicotine dependence, cigarettes, uncomplicated: Secondary | ICD-10-CM | POA: Diagnosis not present

## 2014-04-05 DIAGNOSIS — F1290 Cannabis use, unspecified, uncomplicated: Secondary | ICD-10-CM | POA: Insufficient documentation

## 2014-04-05 DIAGNOSIS — Z794 Long term (current) use of insulin: Secondary | ICD-10-CM | POA: Insufficient documentation

## 2014-04-05 DIAGNOSIS — IMO0002 Reserved for concepts with insufficient information to code with codable children: Secondary | ICD-10-CM

## 2014-04-05 DIAGNOSIS — Z87898 Personal history of other specified conditions: Secondary | ICD-10-CM | POA: Insufficient documentation

## 2014-04-05 DIAGNOSIS — E1040 Type 1 diabetes mellitus with diabetic neuropathy, unspecified: Secondary | ICD-10-CM | POA: Insufficient documentation

## 2014-04-05 DIAGNOSIS — R52 Pain, unspecified: Secondary | ICD-10-CM | POA: Insufficient documentation

## 2014-04-05 DIAGNOSIS — Z7982 Long term (current) use of aspirin: Secondary | ICD-10-CM | POA: Insufficient documentation

## 2014-04-05 DIAGNOSIS — K3184 Gastroparesis: Secondary | ICD-10-CM | POA: Insufficient documentation

## 2014-04-05 DIAGNOSIS — Z9114 Patient's other noncompliance with medication regimen: Secondary | ICD-10-CM | POA: Insufficient documentation

## 2014-04-05 LAB — GLUCOSE, POCT (MANUAL RESULT ENTRY): POC Glucose: 220 mg/dl — AB (ref 70–99)

## 2014-04-05 LAB — POCT GLYCOSYLATED HEMOGLOBIN (HGB A1C): Hemoglobin A1C: 11

## 2014-04-05 MED ORDER — TRAMADOL HCL 50 MG PO TABS: 50.0000 mg | ORAL_TABLET | Freq: Three times a day (TID) | ORAL | Status: DC | PRN

## 2014-04-05 MED ORDER — METOCLOPRAMIDE HCL 5 MG PO TABS: 5.0000 mg | ORAL_TABLET | Freq: Three times a day (TID) | ORAL | Status: DC | PRN

## 2014-04-05 NOTE — Progress Notes (Signed)
Patient ID: Mary [name], female   DOB: [birth year], 43 y.o.   MRN: [id number]  CC: follow up  HPI:  Patient was on Levemir 30 units nightly and has been out of her medication for over 3 weeks.  She states that she has only been taking 70/30 15 untis twice per day.  She has had several admissions since last visit two months ago for DKA.  Patient is non compliant and is still waiting on medication to arrive through the [facility] program.  Since hospital discharge her BS have ranged from 60 to 517 with only 70/30.  She denies seizures activity since 2011.  She reports widespread pain  In her legs and feet numbness. She states that she has sharp pains in her arms, back, and head.  She states that the pain is so bad she has to get a percocet from her friend.  She reports that she has been off cocaine/crack for the past ten years.  She does admit to marijuana use.    Allergies  Allergen Reactions  . Penicillins Rash   Past Medical History  Diagnosis Date  . Kidney stone   . Seizure disorder     started with pregnancy of first son  . Gastritis   . Bipolar 2 disorder   . Post traumatic stress disorder     "flipping out" after people close to her died  . Heart murmur   . Arthritis     r ankle-S/P surgery (2007)  . Anemia     childhood  . Depression     childhood  . Hepatitis C     biopsy in 2010-no rx-was supposed to see a hepatologist in [place].  With cirrhosis  . Neuropathy     in legs and feet and hands  . Cataracts, bilateral   . Scoliosis   . Diabetes mellitus     diagnosed in 1996-always been on insulin  . DKA (diabetic ketoacidoses)     Recurrent admissions for DKA, medication non-complaince, poor social situation  . Diabetic neuropathy, type I diabetes mellitus     numbness bilaterally feet  . HTN (hypertension) 10/12/2013   Current Outpatient Prescriptions on File Prior to Visit  Medication Sig Dispense Refill  . aspirin EC 81 MG tablet Take 81 mg by mouth daily.      .  divalproex (DEPAKOTE) 250 MG DR tablet Take 250 mg by mouth 2 times daily at 12 noon and 4 pm.      . estradiol cypionate (DEPO-ESTRADIOL) 5 MG/ML injection Inject into the muscle every 28 (twenty-eight) days.      Marland Kitchen gabapentin (NEURONTIN) 300 MG capsule Take 300 mg by mouth 3 (three) times daily.      . insulin NPH-regular Human (NOVOLIN 70/30) (70-30) 100 UNIT/ML injection Inject 15 Units into the skin 2 (two) times daily with a meal.      . medroxyPROGESTERone (DEPO-PROVERA) 150 MG/ML injection Inject 150 mg into the muscle every 3 (three) months.      . risperiDONE (RISPERDAL) 2 MG tablet Take 2 mg by mouth at bedtime.      . docusate sodium (COLACE) 100 MG capsule Take 200 mg by mouth daily as needed for mild constipation.      Marland Kitchen HYDROcodone-acetaminophen (NORCO/VICODIN) 5-325 MG per tablet Take 1-2 tablets by mouth every 4 (four) hours as needed for moderate pain or severe pain.  30 tablet  0  . hyoscyamine (LEVSIN SL) 0.125 MG SL tablet Place 0.125 mg under the  tongue every 4 (four) hours as needed for cramping.      . insulin detemir (LEVEMIR) 100 UNIT/ML injection Inject 0.3 mLs (30 Units total) into the skin at bedtime.  10 mL  11  . ondansetron (ZOFRAN) 4 MG tablet Take 1 tablet (4 mg total) by mouth every 6 (six) hours as needed for nausea.  20 tablet  0  . pantoprazole (PROTONIX) 40 MG tablet Take 40 mg by mouth daily.      . polyethylene glycol (MIRALAX / GLYCOLAX) packet Take 17 g by mouth daily as needed for mild constipation.      . potassium chloride SA (K-DUR,KLOR-CON) 20 MEQ tablet Take 20 mEq by mouth daily.       No current facility-administered medications on file prior to visit.   Family History  Problem Relation Age of Onset  . Diabetes Mother     currently 78  . Fibromyalgia Mother   . Cirrhosis Father     died in 83  . Diabetes Maternal Grandmother   . Diabetes Maternal Aunt    History   Social History  . Marital Status: Single    Spouse Name: N/A     Number of Children: N/A  . Years of Education: N/A   Occupational History  . Not on file.   Social History Main Topics  . Smoking status: Current Every Day Smoker -- 0.10 packs/day for 25 years    Types: Cigarettes  . Smokeless tobacco: Never Used  . Alcohol Use: No     Comment: Endorses hasn't been drinking  . Drug Use: 1.00 per week    Special: Marijuana     Comment: used crack last year.  every now and then uses puffs some marijuana-only on the weekends<?>  . Sexual Activity: Yes    Birth Control/ Protection: None   Other Topics Concern  . 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 Came back to [place] from [facility]-was in rehab for 3 mo. Then [facility] to [facility] center [facility] Currently 05/2011 endorses not doing any drugs except very occasional marijuana when she's nauseous.    Lives in [place] with BF, has grown children, 2 sons. Ambulatory.                Review of Systems  Gastrointestinal: Positive for nausea, vomiting and abdominal pain.  Musculoskeletal: Positive for myalgias.  Neurological: Positive for tingling (ble).  All other systems reviewed and are negative.  .    Objective:   Filed Vitals:   04/05/14 1546  BP: 110/74  Pulse: 118  Temp: 99.4 F (37.4 C)  Resp: 16    Physical Exam: Constitutional: Patient appears well-developed and well-nourished. No distress. HENT: Normocephalic, atraumatic, External right and left ear normal. Oropharynx is clear and moist.  Eyes: Conjunctivae and EOM are normal. PERRLA, no scleral icterus. Neck: Normal ROM. Neck supple. No JVD. No tracheal deviation. No thyromegaly. CVS: RRR, S1/S2 +, no murmurs, no gallops, no carotid bruit.  Pulmonary: Effort and breath sounds normal, no stridor, rhonchi, wheezes, rales.  Abdominal: Soft. BS +,  no distension, tenderness, rebound or guarding.  Musculoskeletal: Normal range of motion. No  edema and no tenderness.  Lymphadenopathy: No lymphadenopathy noted, cervical, inguinal or axillary Neuro: Alert. Normal reflexes, muscle tone coordination. No cranial nerve deficit. Skin: Skin is warm and dry. No rash noted. Not diaphoretic. No erythema. No pallor. Psychiatric: Normal mood and  affect. Behavior, judgment, thought content normal.  Lab Results  Component Value Date   WBC 15.5* 03/22/2014   HGB 10.9* 03/22/2014   HCT 34.6* 03/22/2014   MCV 72.2* 03/22/2014   PLT 255 03/22/2014   Lab Results  Component Value Date   CREATININE 0.44* 03/22/2014   BUN 15 03/22/2014   NA 132* 03/22/2014   K 4.6 03/22/2014   CL 101 03/22/2014   CO2 18* 03/22/2014    Lab Results  Component Value Date   HGBA1C 13.2* 12/23/2013   Lipid Panel     Component Value Date/Time   CHOL  Value: 134        ATP III CLASSIFICATION:  <200     mg/dL   Desirable  200-239  mg/dL   Borderline High  >=240    mg/dL   High        04/16/2010 0157   TRIG 185* 04/16/2010 0157   HDL 40 04/16/2010 0157   CHOLHDL 3.4 04/16/2010 0157   VLDL 37 04/16/2010 0157   LDLCALC  Value: 57        Total Cholesterol/HDL:CHD Risk Coronary Heart Disease Risk Table                     Men   Women  1/2 Average Risk   3.4   3.3  Average Risk       5.0   4.4  2 X Average Risk   9.6   7.1  3 X Average Risk  23.4   11.0        Use the calculated Patient Ratio above and the CHD Risk Table to determine the patient's CHD Risk.        ATP III CLASSIFICATION (LDL):  <100     mg/dL   Optimal  100-129  mg/dL   Near or Above                    Optimal  130-159  mg/dL   Borderline  160-189  mg/dL   High  >190     mg/dL   Very High 04/16/2010 0157       Assessment and plan:   Mary [name] was seen today for follow-up.  Diagnoses and associated orders for this visit:  DM (diabetes mellitus), type 1, uncontrolled - Glucose (CBG) - HgB A1c---11.0 - Ambulatory referral to Endocrinology - Lipid panel; Future - Ambulatory referral to diabetic  education  Gastroparesis - metoCLOPramide (REGLAN) 5 MG tablet; Take 1 tablet (5 mg total) by mouth every 8 (eight) hours as needed for nausea.  Generalized pain -  traMADol (ULTRAM) 50 MG tablet; Take 1 tablet (50 mg total) by mouth every 8 (eight) hours as needed.  Tobacco abuse Patient is not ready to quit. Will address on next visit  Return in about 2 weeks (around 04/19/2014) for lab and Nurse Visit-CBG check and 2 mo PCP.        [name], [facility] [phone] 04/11/2014, 10:20 AM

## 2014-04-05 NOTE — Progress Notes (Signed)
HFU continuing care for her diabetes. Pt states that she has sharp pain in her head, arms, breast, hands, fingers widespread.

## 2014-04-05 NOTE — Patient Instructions (Signed)

## 2014-04-11 ENCOUNTER — Encounter (HOSPITAL_COMMUNITY): Payer: Self-pay | Admitting: Emergency Medicine

## 2014-04-11 ENCOUNTER — Inpatient Hospital Stay (HOSPITAL_COMMUNITY)
Admission: EM | Admit: 2014-04-11 | Discharge: 2014-04-13 | DRG: 637 | Disposition: A | Discharge status: Home / Self Care | Payer: Medicaid Other | Attending: Internal Medicine | Admitting: Internal Medicine

## 2014-04-11 ENCOUNTER — Encounter: Payer: Self-pay | Admitting: Internal Medicine

## 2014-04-11 ENCOUNTER — Emergency Department (HOSPITAL_COMMUNITY): Payer: Medicaid Other

## 2014-04-11 DIAGNOSIS — E1065 Type 1 diabetes mellitus with hyperglycemia: Secondary | ICD-10-CM | POA: Diagnosis not present

## 2014-04-11 DIAGNOSIS — Z6820 Body mass index (BMI) 20.0-20.9, adult: Secondary | ICD-10-CM | POA: Diagnosis not present

## 2014-04-11 DIAGNOSIS — E1010 Type 1 diabetes mellitus with ketoacidosis without coma: Secondary | ICD-10-CM | POA: Diagnosis not present

## 2014-04-11 DIAGNOSIS — IMO0002 Reserved for concepts with insufficient information to code with codable children: Secondary | ICD-10-CM

## 2014-04-11 DIAGNOSIS — M1990 Unspecified osteoarthritis, unspecified site: Secondary | ICD-10-CM | POA: Diagnosis present

## 2014-04-11 DIAGNOSIS — F1290 Cannabis use, unspecified, uncomplicated: Secondary | ICD-10-CM | POA: Diagnosis present

## 2014-04-11 DIAGNOSIS — G40909 Epilepsy, unspecified, not intractable, without status epilepticus: Secondary | ICD-10-CM | POA: Diagnosis present

## 2014-04-11 DIAGNOSIS — F319 Bipolar disorder, unspecified: Secondary | ICD-10-CM | POA: Diagnosis not present

## 2014-04-11 DIAGNOSIS — E0810 Diabetes mellitus due to underlying condition with ketoacidosis without coma: Secondary | ICD-10-CM

## 2014-04-11 DIAGNOSIS — D72829 Elevated white blood cell count, unspecified: Secondary | ICD-10-CM | POA: Diagnosis present

## 2014-04-11 DIAGNOSIS — F4310 Post-traumatic stress disorder, unspecified: Secondary | ICD-10-CM | POA: Diagnosis present

## 2014-04-11 DIAGNOSIS — Z7982 Long term (current) use of aspirin: Secondary | ICD-10-CM | POA: Diagnosis not present

## 2014-04-11 DIAGNOSIS — I10 Essential (primary) hypertension: Secondary | ICD-10-CM | POA: Diagnosis present

## 2014-04-11 DIAGNOSIS — K7460 Unspecified cirrhosis of liver: Secondary | ICD-10-CM | POA: Diagnosis present

## 2014-04-11 DIAGNOSIS — Z794 Long term (current) use of insulin: Secondary | ICD-10-CM

## 2014-04-11 DIAGNOSIS — B1920 Unspecified viral hepatitis C without hepatic coma: Secondary | ICD-10-CM | POA: Diagnosis present

## 2014-04-11 DIAGNOSIS — E1043 Type 1 diabetes mellitus with diabetic autonomic (poly)neuropathy: Secondary | ICD-10-CM | POA: Diagnosis present

## 2014-04-11 DIAGNOSIS — F17210 Nicotine dependence, cigarettes, uncomplicated: Secondary | ICD-10-CM | POA: Diagnosis present

## 2014-04-11 DIAGNOSIS — G8929 Other chronic pain: Secondary | ICD-10-CM

## 2014-04-11 DIAGNOSIS — Z79899 Other long term (current) drug therapy: Secondary | ICD-10-CM | POA: Diagnosis not present

## 2014-04-11 DIAGNOSIS — E860 Dehydration: Secondary | ICD-10-CM | POA: Diagnosis present

## 2014-04-11 DIAGNOSIS — F3181 Bipolar II disorder: Secondary | ICD-10-CM | POA: Diagnosis present

## 2014-04-11 DIAGNOSIS — R109 Unspecified abdominal pain: Secondary | ICD-10-CM

## 2014-04-11 DIAGNOSIS — Z833 Family history of diabetes mellitus: Secondary | ICD-10-CM | POA: Diagnosis not present

## 2014-04-11 DIAGNOSIS — Z9114 Patient's other noncompliance with medication regimen: Secondary | ICD-10-CM | POA: Diagnosis not present

## 2014-04-11 DIAGNOSIS — K3184 Gastroparesis: Secondary | ICD-10-CM | POA: Diagnosis present

## 2014-04-11 DIAGNOSIS — E43 Unspecified severe protein-calorie malnutrition: Secondary | ICD-10-CM | POA: Diagnosis not present

## 2014-04-11 DIAGNOSIS — D509 Iron deficiency anemia, unspecified: Secondary | ICD-10-CM | POA: Diagnosis present

## 2014-04-11 DIAGNOSIS — M419 Scoliosis, unspecified: Secondary | ICD-10-CM | POA: Diagnosis present

## 2014-04-11 DIAGNOSIS — R1110 Vomiting, unspecified: Secondary | ICD-10-CM | POA: Diagnosis present

## 2014-04-11 DIAGNOSIS — E1110 Type 2 diabetes mellitus with ketoacidosis without coma: Secondary | ICD-10-CM | POA: Diagnosis present

## 2014-04-11 DIAGNOSIS — R739 Hyperglycemia, unspecified: Secondary | ICD-10-CM | POA: Diagnosis present

## 2014-04-11 LAB — RAPID URINE DRUG SCREEN, HOSP PERFORMED
Amphetamines: NOT DETECTED
BARBITURATES: NOT DETECTED
Benzodiazepines: NOT DETECTED
Cocaine: NOT DETECTED
Opiates: NOT DETECTED
TETRAHYDROCANNABINOL: POSITIVE — AB

## 2014-04-11 LAB — CBC WITH DIFFERENTIAL/PLATELET
BASOS ABS: 0 10*3/uL (ref 0.0–0.1)
BASOS PCT: 0 % (ref 0–1)
EOS ABS: 0 10*3/uL (ref 0.0–0.7)
Eosinophils Relative: 0 % (ref 0–5)
HCT: 43.4 % (ref 36.0–46.0)
HEMOGLOBIN: 13.6 g/dL (ref 12.0–15.0)
LYMPHS ABS: 2.6 10*3/uL (ref 0.7–4.0)
LYMPHS PCT: 24 % (ref 12–46)
MCH: 22.9 pg — AB (ref 26.0–34.0)
MCHC: 31.3 g/dL (ref 30.0–36.0)
MCV: 72.9 fL — ABNORMAL LOW (ref 78.0–100.0)
Monocytes Absolute: 0.6 10*3/uL (ref 0.1–1.0)
Monocytes Relative: 6 % (ref 3–12)
Neutro Abs: 7.6 10*3/uL (ref 1.7–7.7)
Neutrophils Relative %: 70 % (ref 43–77)
Platelets: 131 10*3/uL — ABNORMAL LOW (ref 150–400)
RBC: 5.95 MIL/uL — AB (ref 3.87–5.11)
RDW: 15.4 % (ref 11.5–15.5)
WBC: 10.8 10*3/uL — AB (ref 4.0–10.5)

## 2014-04-11 LAB — BLOOD GAS, VENOUS
ACID-BASE DEFICIT: 5.6 mmol/L — AB (ref 0.0–2.0)
Bicarbonate: 21.5 mEq/L (ref 20.0–24.0)
O2 Saturation: 20.8 %
PCO2 VEN: 50 mmHg (ref 45.0–50.0)
PH VEN: 7.256 (ref 7.250–7.300)
Patient temperature: 98.6
TCO2: 20.1 mmol/L (ref 0–100)

## 2014-04-11 LAB — COMPREHENSIVE METABOLIC PANEL
ALBUMIN: 3.8 g/dL (ref 3.5–5.2)
ALT: 36 U/L — ABNORMAL HIGH (ref 0–35)
AST: 37 U/L (ref 0–37)
Alkaline Phosphatase: 91 U/L (ref 39–117)
Anion gap: 25 — ABNORMAL HIGH (ref 5–15)
BILIRUBIN TOTAL: 0.3 mg/dL (ref 0.3–1.2)
BUN: 24 mg/dL — ABNORMAL HIGH (ref 6–23)
CO2: 17 mEq/L — ABNORMAL LOW (ref 19–32)
Calcium: 9.5 mg/dL (ref 8.4–10.5)
Chloride: 97 mEq/L (ref 96–112)
Creatinine, Ser: 0.59 mg/dL (ref 0.50–1.10)
GFR calc Af Amer: >90 mL/min (ref >90)
GFR calc non Af Amer: >90 mL/min (ref >90)
Glucose, Bld: 414 mg/dL — ABNORMAL HIGH (ref 70–99)
POTASSIUM: 4.6 meq/L (ref 3.7–5.3)
Sodium: 139 mEq/L (ref 137–147)
TOTAL PROTEIN: 8 g/dL (ref 6.0–8.3)

## 2014-04-11 LAB — URINALYSIS, ROUTINE W REFLEX MICROSCOPIC
Bilirubin Urine: NEGATIVE
Hgb urine dipstick: NEGATIVE
Ketones, ur: >80 mg/dL — AB
Leukocytes, UA: NEGATIVE
Nitrite: NEGATIVE
Protein, ur: NEGATIVE mg/dL
SPECIFIC GRAVITY, URINE: 1.038 — AB (ref 1.005–1.030)
Urobilinogen, UA: 0.2 mg/dL (ref 0.0–1.0)
pH: 5.5 (ref 5.0–8.0)

## 2014-04-11 LAB — BASIC METABOLIC PANEL
ANION GAP: 14 (ref 5–15)
Anion gap: 13 (ref 5–15)
BUN: 23 mg/dL (ref 6–23)
BUN: 22 mg/dL (ref 6–23)
CHLORIDE: 110 meq/L (ref 96–112)
CO2: 24 meq/L (ref 19–32)
CO2: 22 meq/L (ref 19–32)
Calcium: 9.3 mg/dL (ref 8.4–10.5)
Calcium: 9.5 mg/dL (ref 8.4–10.5)
Chloride: 110 mEq/L (ref 96–112)
Creatinine, Ser: 0.5 mg/dL (ref 0.50–1.10)
Creatinine, Ser: 0.48 mg/dL — ABNORMAL LOW (ref 0.50–1.10)
GFR calc Af Amer: >90 mL/min (ref >90)
GFR calc non Af Amer: >90 mL/min (ref >90)
GFR calc non Af Amer: >90 mL/min (ref >90)
GLUCOSE: 188 mg/dL — AB (ref 70–99)
Glucose, Bld: 105 mg/dL — ABNORMAL HIGH (ref 70–99)
POTASSIUM: 3.6 meq/L — AB (ref 3.7–5.3)
POTASSIUM: 3.8 meq/L (ref 3.7–5.3)
SODIUM: 146 meq/L (ref 137–147)
Sodium: 147 mEq/L (ref 137–147)

## 2014-04-11 LAB — CBG MONITORING, ED
GLUCOSE-CAPILLARY: 404 mg/dL — AB (ref 70–99)
GLUCOSE-CAPILLARY: 291 mg/dL — AB (ref 70–99)
GLUCOSE-CAPILLARY: 272 mg/dL — AB (ref 70–99)
Glucose-Capillary: 387 mg/dL — ABNORMAL HIGH (ref 70–99)
Glucose-Capillary: 289 mg/dL — ABNORMAL HIGH (ref 70–99)

## 2014-04-11 LAB — LIPASE, BLOOD: Lipase: 14 U/L (ref 11–59)

## 2014-04-11 LAB — URINE MICROSCOPIC-ADD ON

## 2014-04-11 LAB — GLUCOSE, CAPILLARY
GLUCOSE-CAPILLARY: 106 mg/dL — AB (ref 70–99)
GLUCOSE-CAPILLARY: 94 mg/dL (ref 70–99)

## 2014-04-11 LAB — MRSA PCR SCREENING: MRSA BY PCR: NEGATIVE

## 2014-04-11 LAB — CBC
HCT: 39.4 % (ref 36.0–46.0)
Hemoglobin: 12.6 g/dL (ref 12.0–15.0)
MCH: 23 pg — ABNORMAL LOW (ref 26.0–34.0)
MCHC: 32 g/dL (ref 30.0–36.0)
MCV: 71.9 fL — ABNORMAL LOW (ref 78.0–100.0)
PLATELETS: 141 10*3/uL — AB (ref 150–400)
RBC: 5.48 MIL/uL — AB (ref 3.87–5.11)
RDW: 15.3 % (ref 11.5–15.5)
WBC: 13.7 10*3/uL — ABNORMAL HIGH (ref 4.0–10.5)

## 2014-04-11 LAB — ETHANOL: Alcohol, Ethyl (B): <11 mg/dL (ref 0–11)

## 2014-04-11 MED ORDER — INSULIN REGULAR HUMAN 100 UNIT/ML IJ SOLN
INTRAMUSCULAR | Status: DC
  Administered 2014-04-11: 2.3 [IU]/h via INTRAVENOUS
  Filled 2014-04-11: qty 2.5

## 2014-04-11 MED ORDER — METOCLOPRAMIDE HCL 5 MG/ML IJ SOLN
10.0000 mg | Freq: Three times a day (TID) | INTRAMUSCULAR | Status: DC
  Administered 2014-04-11: 10 mg via INTRAVENOUS
  Filled 2014-04-11: qty 2

## 2014-04-11 MED ORDER — SODIUM CHLORIDE 0.9 % IV SOLN
INTRAVENOUS | Status: DC
  Administered 2014-04-11: 11:00:00 via INTRAVENOUS

## 2014-04-11 MED ORDER — ONDANSETRON HCL 4 MG/2ML IJ SOLN: 4.0000 mg | Freq: Once | INTRAMUSCULAR | Status: DC

## 2014-04-11 MED ORDER — ASPIRIN EC 81 MG PO TBEC
81.0000 mg | DELAYED_RELEASE_TABLET | Freq: Every day | ORAL | Status: DC
  Administered 2014-04-11 – 2014-04-13 (×3): 81 mg via ORAL
  Filled 2014-04-11 (×3): qty 1

## 2014-04-11 MED ORDER — INSULIN REGULAR BOLUS VIA INFUSION
0.0000 [IU] | Freq: Three times a day (TID) | INTRAVENOUS | Status: DC
  Filled 2014-04-11: qty 10

## 2014-04-11 MED ORDER — METOCLOPRAMIDE HCL 5 MG/ML IJ SOLN
10.0000 mg | Freq: Once | INTRAMUSCULAR | Status: AC
  Administered 2014-04-11: 10 mg via INTRAVENOUS
  Filled 2014-04-11: qty 2

## 2014-04-11 MED ORDER — VALPROATE SODIUM 500 MG/5ML IV SOLN
250.0000 mg | Freq: Two times a day (BID) | INTRAVENOUS | Status: DC
  Administered 2014-04-11: 250 mg via INTRAVENOUS
  Filled 2014-04-11 (×2): qty 2.5

## 2014-04-11 MED ORDER — GLUCERNA SHAKE PO LIQD
237.0000 mL | Freq: Three times a day (TID) | ORAL | Status: DC
  Administered 2014-04-11 – 2014-04-13 (×6): 237 mL via ORAL
  Filled 2014-04-11 (×7): qty 237

## 2014-04-11 MED ORDER — HYDROMORPHONE HCL 1 MG/ML IJ SOLN
1.0000 mg | Freq: Once | INTRAMUSCULAR | Status: AC
  Administered 2014-04-11: 1 mg via INTRAVENOUS
  Filled 2014-04-11: qty 1

## 2014-04-11 MED ORDER — POTASSIUM CHLORIDE 10 MEQ/100ML IV SOLN
10.0000 meq | INTRAVENOUS | Status: AC
  Administered 2014-04-11 (×2): 10 meq via INTRAVENOUS
  Filled 2014-04-11 (×2): qty 100

## 2014-04-11 MED ORDER — ENOXAPARIN SODIUM 40 MG/0.4ML ~~LOC~~ SOLN
40.0000 mg | SUBCUTANEOUS | Status: DC
  Administered 2014-04-11 – 2014-04-12 (×2): 40 mg via SUBCUTANEOUS
  Filled 2014-04-11 (×3): qty 0.4

## 2014-04-11 MED ORDER — SODIUM CHLORIDE 0.9 % IV SOLN: INTRAVENOUS | Status: AC

## 2014-04-11 MED ORDER — DEXTROSE-NACL 5-0.45 % IV SOLN
INTRAVENOUS | Status: DC
  Administered 2014-04-11: 16:00:00 via INTRAVENOUS

## 2014-04-11 MED ORDER — SODIUM CHLORIDE 0.9 % IV BOLUS (SEPSIS)
1000.0000 mL | Freq: Once | INTRAVENOUS | Status: AC
  Administered 2014-04-11: 1000 mL via INTRAVENOUS

## 2014-04-11 MED ORDER — SODIUM CHLORIDE 0.9 % IV SOLN
INTRAVENOUS | Status: DC
  Filled 2014-04-11: qty 2.5

## 2014-04-11 MED ORDER — SODIUM CHLORIDE 0.9 % IV SOLN
INTRAVENOUS | Status: DC
  Administered 2014-04-12: 05:00:00 via INTRAVENOUS

## 2014-04-11 MED ORDER — SODIUM CHLORIDE 0.9 % IV SOLN: INTRAVENOUS | Status: DC

## 2014-04-11 MED ORDER — HYDROMORPHONE HCL 1 MG/ML IJ SOLN
0.5000 mg | INTRAMUSCULAR | Status: DC | PRN
  Administered 2014-04-11: 0.5 mg via INTRAVENOUS
  Filled 2014-04-11: qty 1

## 2014-04-11 MED ORDER — LORAZEPAM 2 MG/ML IJ SOLN
1.0000 mg | Freq: Once | INTRAMUSCULAR | Status: AC
  Administered 2014-04-11: 1 mg via INTRAVENOUS
  Filled 2014-04-11: qty 1

## 2014-04-11 MED ORDER — RISPERIDONE 2 MG PO TABS
2.0000 mg | ORAL_TABLET | Freq: Every day | ORAL | Status: DC
  Administered 2014-04-11 – 2014-04-12 (×2): 2 mg via ORAL
  Filled 2014-04-11 (×4): qty 1

## 2014-04-11 MED ORDER — GABAPENTIN 300 MG PO CAPS
300.0000 mg | ORAL_CAPSULE | Freq: Three times a day (TID) | ORAL | Status: DC
  Administered 2014-04-11 – 2014-04-13 (×5): 300 mg via ORAL
  Filled 2014-04-11 (×7): qty 1

## 2014-04-11 MED ORDER — DIPHENHYDRAMINE HCL 50 MG/ML IJ SOLN
25.0000 mg | Freq: Once | INTRAMUSCULAR | Status: AC
  Administered 2014-04-11: 25 mg via INTRAVENOUS
  Filled 2014-04-11: qty 1

## 2014-04-11 MED ORDER — DEXTROSE-NACL 5-0.45 % IV SOLN
INTRAVENOUS | Status: DC
  Administered 2014-04-11 – 2014-04-12 (×2): via INTRAVENOUS

## 2014-04-11 MED ORDER — DEXTROSE 50 % IV SOLN: 25.0000 mL | INTRAVENOUS | Status: DC | PRN

## 2014-04-11 MED ORDER — DEXTROSE-NACL 5-0.45 % IV SOLN: INTRAVENOUS | Status: DC

## 2014-04-11 MED ORDER — ONDANSETRON HCL 4 MG/2ML IJ SOLN: 4.0000 mg | Freq: Four times a day (QID) | INTRAMUSCULAR | Status: DC | PRN

## 2014-04-11 NOTE — ED Notes (Signed)
Bed: WA25 Expected date:  Expected time:  Means of arrival:  Comments: EMS  

## 2014-04-11 NOTE — ED Notes (Signed)
Pt and family member states she started having nausea/vomiting Saturday night/early Sunday morning, states has taken her insulin but her blood sugar has still been high, also hasn't been able to keep down her medications at home, pt rolling in bed uncomfortable states she is having abdominal pain 10/10, after medications given pt is now resting in bed. Denies diarrhea.

## 2014-04-11 NOTE — ED Notes (Signed)
Patient transported to X-ray 

## 2014-04-11 NOTE — ED Notes (Signed)
Pt attempted to void in bed pan, couple drops seen in bedpan when pt finished, will try bedpan again when 2nd bag of fluids is done infusing.

## 2014-04-11 NOTE — H&P (Signed)
[facility] History and Physical  ABRA LINGENFELTER DXA:[id number] DOB: 08-10-1970 DOA: 04/11/2014  Referring physician:  T. Zenia Resides PCP:  [name], NP   Chief Complaint:  Abdominal pain, nausea, vomiting  HPI:  The patient is a 43 y.o. 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  She was last admitted on 10/5 through 10/6 with DKA and discharged on levemir 30 units with 70/30 15 units BID.  She took her 70/30 only because of a problem getting levemir and was seen in PCP clinic on 10/20 reporting CBG from 60 to 517.  According to the note, she was referred to endocrinology.  According to friend, she was told to take lantus 30 units once daily without the 70/30 insulin.  (Patient received multiple sedating medications in ER prior to my arrival and was not able to answer questions reliably.)   Her friend states that 2 days ago, the Ms. Herman said she was not feeling well and she stayed in bed all day and frequently asked for her reglan for nausea.  Unclear if she checked her blood sugar or administered her insulin that day.  Yesterday, she had abdominal pain and nausea with heaves and had a normal BM.  No fevers, cough, chest pain, SOB.  CBGs yesterday were in the 260-300 range and she did receive her levemir 30 units last night.    In ER, VSS.  VBG pH 7.25.  Her CO2 17, AG 25, glucose 414.  UA + ketones.  WBC 10.8.  CXR and KUB neg.  She was given NS 2L, benadryl 25mg , reglan 10mg , dilaudid 1mg , ativan 1mg , zofran 4mg  and is being started on insulin gtt.  Being admitted for DKA.    Review of Systems:  Unable to obtain from patient - sedated by medications.  See HPI for ROS obtained from  friend.     Past Medical History  Diagnosis Date  . Kidney stone   . Seizure disorder     started with pregnancy of first son  . Gastritis   . Bipolar 2 disorder   . Post traumatic stress disorder     "flipping out" after people close to her died  . Heart murmur   . Arthritis     r ankle-S/P surgery (2007)  . Anemia     childhood  . Depression     childhood  . Hepatitis C     biopsy in 2010-no rx-was supposed to see a hepatologist in [place].  With cirrhosis  . Neuropathy     in legs and feet and hands  . Cataracts, bilateral   . Scoliosis   . Diabetes mellitus     diagnosed in 1996-always been on insulin  . DKA (diabetic ketoacidoses)     Recurrent admissions for DKA, medication non-complaince, poor social situation  . Diabetic neuropathy, type I diabetes mellitus     numbness bilaterally feet  . HTN (hypertension) 10/12/2013   Past Surgical History  Procedure Laterality Date  . Ankle surgery  2007  . Endometrial biopsy  06/22/2012  . Cholecystectomy  04/07/2013  . Cholecystectomy N/A 04/07/2013    Procedure: LAPAROSCOPIC CHOLECYSTECTOMY WITH INTRAOPERATIVE CHOLANGIOGRAM;  Surgeon: Adin Hector, MD;  Location: [facility];  Service: General;  Laterality:  N/A;  . Cesarean section      3 times   Social History:  reports that she has been smoking Cigarettes.  She has a 2.5 pack-year smoking history. She has never used smokeless tobacco. She reports that she uses illicit drugs (Marijuana) about once per week. She reports that she does not drink alcohol. Lives in [place] with BF, has grown children, 2 sons. Ambulatory.  Allergies  Allergen Reactions  . Penicillins Rash    Family History  Problem Relation Age of Onset  . Diabetes Mother     currently 42  . Fibromyalgia Mother   . Cirrhosis Father     died in 79  . Diabetes Maternal Grandmother   . Diabetes Maternal Aunt      Prior to Admission medications   Medication Sig Start Date End Date Taking?  Authorizing Provider  insulin detemir (LEVEMIR) 100 UNIT/ML injection Inject 30 Units into the skin at bedtime.   Yes Historical Provider, MD  metoCLOPramide (REGLAN) 5 MG tablet Take 5 mg by mouth every 8 (eight) hours as needed for nausea.   Yes Historical Provider, MD  traMADol (ULTRAM) 50 MG tablet Take 50 mg by mouth every 8 (eight) hours as needed.   Yes Historical Provider, MD  aspirin EC 81 MG tablet Take 81 mg by mouth daily.    Historical Provider, MD  divalproex (DEPAKOTE) 250 MG DR tablet Take 250 mg by mouth 2 times daily at 12 noon and 4 pm.    Historical Provider, MD  gabapentin (NEURONTIN) 300 MG capsule Take 300 mg by mouth 3 (three) times daily.    Historical Provider, MD  insulin NPH-regular Human (NOVOLIN 70/30) (70-30) 100 UNIT/ML injection Inject 15 Units into the skin 2 (two) times daily with a meal.    Historical Provider, MD  medroxyPROGESTERone (DEPO-PROVERA) 150 MG/ML injection Inject 150 mg into the muscle every 3 (three) months.    Historical Provider, MD  pantoprazole (PROTONIX) 40 MG tablet Take 40 mg by mouth daily.    Historical Provider, MD  polyethylene glycol (MIRALAX / GLYCOLAX) packet Take 17 g by mouth daily as needed for mild constipation.    Historical Provider, MD  risperiDONE (RISPERDAL) 2 MG tablet Take 2 mg by mouth at bedtime.    Historical Provider, MD   Physical Exam: Filed Vitals:   04/11/14 1400 04/11/14 1430 04/11/14 1500 04/11/14 1530  BP: 133/72 137/68 122/66 132/69  Pulse: 101 103 94 108  Temp:      TempSrc:      Resp:      SpO2: 100% 99% 98% 100%     General:  Thin BF, NAD, sleeping, slurred speech when aroused and speaks nonsensically, patient declined/was not cooperative with exam  Eyes:  anicteric, non-injected.  ENT:  Nares clear.  Dry lips.  Neck:  Supple   Lymph:  No supraclavicular LAD.  Cardiovascular:  RRR, normal S1, S2, without m/r/g.  2+ pulses, warm extremities  Respiratory:  CTA bilaterally without increased  WOB.  Abdomen:  NABS.  Soft, ND, possible mild TTP without rebound or guarding  Skin:  No obvious rashes or focal lesions.  Musculoskeletal:  Normal bulk and tone.  No LE edema.  Psychiatric:  Asleep but arouseable.    Neurologic:  Grossly moves all extremities  Labs on Admission:  Basic Metabolic Panel:  Recent Labs Lab 04/11/14 1055  NA 139  K 4.6  CL 97  CO2 17*  GLUCOSE 414*  BUN 24*  CREATININE 0.59  CALCIUM 9.5   Liver Function Tests:  Recent Labs Lab 04/11/14 1055  AST 37  ALT 36*  ALKPHOS 91  BILITOT 0.3  PROT 8.0  ALBUMIN 3.8    Recent Labs Lab 04/11/14 1055  LIPASE 14   No results found for this basename: AMMONIA,  in the last 168 hours CBC:  Recent Labs Lab 04/11/14 1055  WBC 10.8*  NEUTROABS 7.6  HGB 13.6  HCT 43.4  MCV 72.9*  PLT 131*   Cardiac Enzymes: No results found for this basename: CKTOTAL, CKMB, CKMBINDEX, TROPONINI,  in the last 168 hours  BNP (last 3 results) No results found for this basename: PROBNP,  in the last 8760 hours CBG:  Recent Labs Lab 04/11/14 0920 04/11/14 1143 04/11/14 1518  GLUCAP 404* 387* 289*    Radiological Exams on Admission: Dg Abd Acute W/chest  04/11/2014   CLINICAL DATA:  Abdominal pain, patient unable to provide additional history due to preceding medication administration. Personal history of diabetes, DKA, hypertension, hepatitis-C  EXAM: ACUTE ABDOMEN SERIES (ABDOMEN 2 VIEW & CHEST 1 VIEW)  COMPARISON:  03/21/2014  FINDINGS: Normal heart size, mediastinal contours and pulmonary vascularity.  Mild bullous disease changes at the RIGHT apex.  Lungs clear.  No pleural effusion or pneumothorax.  Thoracolumbar scoliosis.  Normal bowel gas pattern.  No bowel dilatation or bowel wall thickening or free intraperitoneal air.  Surgical clips RIGHT upper quadrant question cholecystectomy.  No urinary tract calcification.  IMPRESSION: Thoracolumbar scoliosis.  Mild bullous disease changes RIGHT apex.   No acute abdominal findings.   Electronically Signed   By: Lavonia Dana M.D.   On: 04/11/2014 10:21    EKG: pending  Assessment/Plan Active Problems:   DKA (diabetic ketoacidoses)   Hyperglycemia  ---  Uncontrolled DM with complications of gastroparesis, neuropathy, currently in DKA, A1c 11 on 10/20 -  Insulin gtt -  q2h BMP -  Transition to lantus 30 units with SSI once gap closes and bicarb 20 or higher -  Replete potassium  Abdominal pain, nausea and vomiting likely secondary to gastroparesis and DKA -  Continue IV reglan -  Continue antiemetics and IVF -  Continue IV narcotics for pain until tolerating PO  Diabetic neuropathy, stable, continue gabapentin  Transaminitis, stable, attributed to hep C.   Leukocytosis likely due to vomiting, dehydration and DKA.  UA and CXR not suggestive of infection.   -  IVF and antiemetics   Microcytic anemia. Previous iron studies with elevated iron saturation and iron level. TSH 1.64 wnl. Vit b12 1225, folate 909.    Severe protein-calorie malnutrition, from uncontrolled DM -  Diabetic diet -  Start glucerna   Seizures, stable, IV depakote pending tolerating diet    Diet:  diabetic Access:  PIV IVF:  yes Proph:  lovenox  Code Status: full Family Communication: patient and friend Disposition Plan: Admit to stepdown  Time spent: 60 min Janece Canterbury [facility] Pager [phone]  If 7PM-7AM, please contact night-coverage [email or web address] Password [email or web address] 04/11/2014, 4:07 PM

## 2014-04-11 NOTE — ED Provider Notes (Signed)
CSN: [id number]     Arrival date & time 04/11/14  0902 History   First MD Initiated Contact with Patient 04/11/14 [phone]     Chief Complaint  Patient presents with  . Emesis     (Consider location/radiation/quality/duration/timing/severity/associated sxs/prior Treatment) HPI Comments: Patient here complaining of diffuse abdominal pain and vomiting. History of diabetic gastroparesis as well as DKA. She is well into the system. She is very anxious at this time and history is difficult. State that symptoms started yesterday and have been persistent. Describes abdominal pain as being diffuse and similar to her prior episodes. She denies any urinary symptoms. Denies any vaginal bleeding or discharge. Symptoms are persistent and nothing makes them better or worse. No treatment used prior to arrival.  Patient is a 42 y.o. female presenting with vomiting. The history is provided by the patient.  Emesis   Past Medical History  Diagnosis Date  . Kidney stone   . Seizure disorder     started with pregnancy of first son  . Gastritis   . Bipolar 2 disorder   . Post traumatic stress disorder     "flipping out" after people close to her died  . Heart murmur   . Arthritis     r ankle-S/P surgery (2007)  . Anemia     childhood  . Depression     childhood  . Hepatitis C     biopsy in 2010-no rx-was supposed to see a hepatologist in [place].  With cirrhosis  . Neuropathy     in legs and feet and hands  . Cataracts, bilateral   . Scoliosis   . Diabetes mellitus     diagnosed in 1996-always been on insulin  . DKA (diabetic ketoacidoses)     Recurrent admissions for DKA, medication non-complaince, poor social situation  . Diabetic neuropathy, type I diabetes mellitus     numbness bilaterally feet  . HTN (hypertension) 10/12/2013   Past Surgical History  Procedure Laterality Date  . Ankle surgery  2007  . Endometrial biopsy  06/22/2012  . Cholecystectomy  04/07/2013  . Cholecystectomy  N/A 04/07/2013    Procedure: LAPAROSCOPIC CHOLECYSTECTOMY WITH INTRAOPERATIVE CHOLANGIOGRAM;  Surgeon: Adin Hector, MD;  Location: [facility];  Service: General;  Laterality: N/A;  . Cesarean section      3 times   Family History  Problem Relation Age of Onset  . Diabetes Mother     currently 53  . Fibromyalgia Mother   . Cirrhosis Father     died in 41  . Diabetes Maternal Grandmother   . Diabetes Maternal Aunt    History  Substance Use Topics  . Smoking status: Current Every Day Smoker -- 0.10 packs/day for 25 years    Types: Cigarettes  . Smokeless tobacco: Never Used  . Alcohol Use: No     Comment: Endorses hasn't been drinking   OB History   Grav Para Term Preterm Abortions TAB SAB Ect Mult Living   4 3 3  0 1 0 1 0 1 2     Review of Systems  Gastrointestinal: Positive for vomiting.  All other systems reviewed and are negative.     Allergies  Penicillins  Home Medications   Prior to Admission medications   Medication Sig Start Date End Date Taking? Authorizing Provider  insulin detemir (LEVEMIR) 100 UNIT/ML injection Inject 30 Units into the skin at bedtime.   Yes Historical Provider, MD  metoCLOPramide (REGLAN) 5 MG tablet Take 5 mg by mouth  every 8 (eight) hours as needed for nausea.   Yes Historical Provider, MD  traMADol (ULTRAM) 50 MG tablet Take 50 mg by mouth every 8 (eight) hours as needed.   Yes Historical Provider, MD  aspirin EC 81 MG tablet Take 81 mg by mouth daily.    Historical Provider, MD  divalproex (DEPAKOTE) 250 MG DR tablet Take 250 mg by mouth 2 times daily at 12 noon and 4 pm.    Historical Provider, MD  gabapentin (NEURONTIN) 300 MG capsule Take 300 mg by mouth 3 (three) times daily.    Historical Provider, MD  insulin NPH-regular Human (NOVOLIN 70/30) (70-30) 100 UNIT/ML injection Inject 15 Units into the skin 2 (two) times daily with a meal.    Historical Provider, MD  medroxyPROGESTERone (DEPO-PROVERA) 150 MG/ML injection Inject 150  mg into the muscle every 3 (three) months.    Historical Provider, MD  pantoprazole (PROTONIX) 40 MG tablet Take 40 mg by mouth daily.    Historical Provider, MD  polyethylene glycol (MIRALAX / GLYCOLAX) packet Take 17 g by mouth daily as needed for mild constipation.    Historical Provider, MD  risperiDONE (RISPERDAL) 2 MG tablet Take 2 mg by mouth at bedtime.    Historical Provider, MD   BP 119/73  Pulse 86  Temp(Src) 98.4 F (36.9 C) (Oral)  Resp 16  SpO2 99% Physical Exam  Nursing note and vitals reviewed. Constitutional: She is oriented to person, place, and time. She appears well-developed and well-nourished.  Non-toxic appearance. No distress.  HENT:  Head: Normocephalic and atraumatic.  Eyes: Conjunctivae, EOM and lids are normal. Pupils are equal, round, and reactive to light.  Neck: Normal range of motion. Neck supple. No tracheal deviation present. No mass present.  Cardiovascular: Normal rate, regular rhythm and normal heart sounds.  Exam reveals no gallop.   No murmur heard. Pulmonary/Chest: Effort normal and breath sounds normal. No stridor. No respiratory distress. She has no decreased breath sounds. She has no wheezes. She has no rhonchi. She has no rales.  Abdominal: Soft. Normal appearance and bowel sounds are normal. She exhibits no distension. There is generalized tenderness. There is no rigidity, no rebound, no guarding and no CVA tenderness.  Musculoskeletal: Normal range of motion. She exhibits no edema and no tenderness.  Neurological: She is alert and oriented to person, place, and time. She has normal strength. No cranial nerve deficit or sensory deficit. GCS eye subscore is 4. GCS verbal subscore is 5. GCS motor subscore is 6.  Skin: Skin is warm and dry. No abrasion and no rash noted.  Psychiatric: Her speech is normal and behavior is normal. Her mood appears anxious.    ED Course  Procedures (including critical care time) Labs Review Labs Reviewed  CBC  WITH DIFFERENTIAL - Abnormal; Notable for the following:    WBC 10.8 (*)    RBC 5.95 (*)    MCV 72.9 (*)    MCH 22.9 (*)    Platelets 131 (*)    All other components within normal limits  COMPREHENSIVE METABOLIC PANEL - Abnormal; Notable for the following:    CO2 17 (*)    Glucose, Bld 414 (*)    BUN 24 (*)    ALT 36 (*)    Anion gap 25 (*)    All other components within normal limits  URINALYSIS, ROUTINE W REFLEX MICROSCOPIC - Abnormal; Notable for the following:    Specific Gravity, Urine 1.038 (*)    Glucose, UA >  1000 (*)    Ketones, ur >80 (*)    All other components within normal limits  URINE RAPID DRUG SCREEN (HOSP PERFORMED) - Abnormal; Notable for the following:    Tetrahydrocannabinol POSITIVE (*)    All other components within normal limits  BLOOD GAS, VENOUS - Abnormal; Notable for the following:    Acid-base deficit 5.6 (*)    All other components within normal limits  CBG MONITORING, ED - Abnormal; Notable for the following:    Glucose-Capillary 404 (*)    All other components within normal limits  CBG MONITORING, ED - Abnormal; Notable for the following:    Glucose-Capillary 387 (*)    All other components within normal limits  URINE CULTURE  LIPASE, BLOOD  ETHANOL  URINE MICROSCOPIC-ADD ON  CBG MONITORING, ED    Imaging Review Dg Abd Acute W/chest  04/11/2014   CLINICAL DATA:  Abdominal pain, patient unable to provide additional history due to preceding medication administration. Personal history of diabetes, DKA, hypertension, hepatitis-C  EXAM: ACUTE ABDOMEN SERIES (ABDOMEN 2 VIEW & CHEST 1 VIEW)  COMPARISON:  03/21/2014  FINDINGS: Normal heart size, mediastinal contours and pulmonary vascularity.  Mild bullous disease changes at the RIGHT apex.  Lungs clear.  No pleural effusion or pneumothorax.  Thoracolumbar scoliosis.  Normal bowel gas pattern.  No bowel dilatation or bowel wall thickening or free intraperitoneal air.  Surgical clips RIGHT upper quadrant  question cholecystectomy.  No urinary tract calcification.  IMPRESSION: Thoracolumbar scoliosis.  Mild bullous disease changes RIGHT apex.  No acute abdominal findings.   Electronically Signed   By: Lavonia Dana M.D.   On: 04/11/2014 10:21     EKG Interpretation None      MDM   Final diagnoses:  Abdominal pain   Patient given IV fluids and pain medication here. She has early DKA at this time. Will be admitted to stepdown    Leota Jacobsen, MD 04/11/14 1459

## 2014-04-11 NOTE — ED Notes (Signed)
Per EMS pt from home stated has been vomiting since yesterday, states blood sugar has been increasing, for EMS CBG was 418, having abdominal pain also, given 4mg  Zofran through 22G L wrist, 50 cc NS infused so far. BP 132/80, HR 76 normal sinus, RR 22, Temp 99.3

## 2014-04-11 NOTE — Progress Notes (Signed)
  CARE MANAGEMENT ED NOTE 04/11/2014  Patient:  DELVA, DERDEN   Account Number:  [id number]  Date Initiated:  04/11/2014  Documentation initiated by:  Livia Snellen  Subjective/Objective Assessment:   Paient presents to Ed with diffuse abdominal pian and vomiting     Subjective/Objective Assessment Detail:   Patient with pmhx of depression, swizure disorder, anemai, Hep C, DM, DKA, HTN, diabetic neurpoathy.  Patient noted to have 11 admissions within the last six months.  Patient on IV insulin drip.     Action/Plan:   Action/Plan Detail:   Anticipated DC Date:       Status Recommendation to Physician:   Result of Recommendation:    Other ED Services  Consult Working [place]  Other    Choice offered to / List presented to:            Status of service:  Completed, signed off  ED Comments:   ED Comments Detail:  EDCM spoke to patient and her friend at bedside.  Patient confirms she has had a follow up visit with [name] on 04/05/2014.  Patient reports she was told to stop taking 70/30 insulin and startr Lantus insulin.  Patient reports she is enrolled with [facility] program and waiting for her Risperdol.  Patient reports she has Lantus insulin. Patient then became annoyed and stated, "I haven't gotten any sleep today.  People keep coming in here asking me questions!"  [facility] informed patient was trying to make sure she had everything she needed at home to avoid frequent hospitalizations.  EDCM left the room.  Placed above information in readmission focus note.  Placed request for diabetic coordinator in physician sticky note.  No further EDCM needs at this time.

## 2014-04-12 DIAGNOSIS — G8929 Other chronic pain: Secondary | ICD-10-CM

## 2014-04-12 DIAGNOSIS — Z9114 Patient's other noncompliance with medication regimen: Secondary | ICD-10-CM

## 2014-04-12 DIAGNOSIS — R109 Unspecified abdominal pain: Secondary | ICD-10-CM

## 2014-04-12 LAB — GLUCOSE, CAPILLARY
GLUCOSE-CAPILLARY: 209 mg/dL — AB (ref 70–99)
GLUCOSE-CAPILLARY: 147 mg/dL — AB (ref 70–99)
GLUCOSE-CAPILLARY: 101 mg/dL — AB (ref 70–99)
GLUCOSE-CAPILLARY: 77 mg/dL (ref 70–99)
GLUCOSE-CAPILLARY: 159 mg/dL — AB (ref 70–99)
Glucose-Capillary: 109 mg/dL — ABNORMAL HIGH (ref 70–99)
Glucose-Capillary: 146 mg/dL — ABNORMAL HIGH (ref 70–99)
Glucose-Capillary: 177 mg/dL — ABNORMAL HIGH (ref 70–99)
Glucose-Capillary: 184 mg/dL — ABNORMAL HIGH (ref 70–99)
Glucose-Capillary: 189 mg/dL — ABNORMAL HIGH (ref 70–99)
Glucose-Capillary: 199 mg/dL — ABNORMAL HIGH (ref 70–99)
Glucose-Capillary: 221 mg/dL — ABNORMAL HIGH (ref 70–99)
Glucose-Capillary: 265 mg/dL — ABNORMAL HIGH (ref 70–99)
Glucose-Capillary: 99 mg/dL (ref 70–99)

## 2014-04-12 LAB — URINE CULTURE
Colony Count: NO GROWTH
Culture: NO GROWTH

## 2014-04-12 LAB — BASIC METABOLIC PANEL
ANION GAP: 10 (ref 5–15)
ANION GAP: 12 (ref 5–15)
Anion gap: 12 (ref 5–15)
BUN: 19 mg/dL (ref 6–23)
BUN: 19 mg/dL (ref 6–23)
BUN: 21 mg/dL (ref 6–23)
CALCIUM: 8.8 mg/dL (ref 8.4–10.5)
CALCIUM: 9.3 mg/dL (ref 8.4–10.5)
CO2: 23 mEq/L (ref 19–32)
CO2: 24 mEq/L (ref 19–32)
CO2: 22 meq/L (ref 19–32)
Calcium: 9 mg/dL (ref 8.4–10.5)
Chloride: 107 mEq/L (ref 96–112)
Chloride: 107 mEq/L (ref 96–112)
Chloride: 110 mEq/L (ref 96–112)
Creatinine, Ser: 0.48 mg/dL — ABNORMAL LOW (ref 0.50–1.10)
Creatinine, Ser: 0.48 mg/dL — ABNORMAL LOW (ref 0.50–1.10)
Creatinine, Ser: 0.5 mg/dL (ref 0.50–1.10)
GFR calc Af Amer: >90 mL/min (ref >90)
GFR calc Af Amer: >90 mL/min (ref >90)
GFR calc non Af Amer: >90 mL/min (ref >90)
GLUCOSE: 105 mg/dL — AB (ref 70–99)
GLUCOSE: 199 mg/dL — AB (ref 70–99)
Glucose, Bld: 183 mg/dL — ABNORMAL HIGH (ref 70–99)
POTASSIUM: 3.7 meq/L (ref 3.7–5.3)
POTASSIUM: 3.8 meq/L (ref 3.7–5.3)
POTASSIUM: 4.1 meq/L (ref 3.7–5.3)
SODIUM: 145 meq/L (ref 137–147)
Sodium: 141 mEq/L (ref 137–147)
Sodium: 141 mEq/L (ref 137–147)

## 2014-04-12 LAB — CBC
HCT: 34.9 % — ABNORMAL LOW (ref 36.0–46.0)
HEMOGLOBIN: 10.9 g/dL — AB (ref 12.0–15.0)
MCH: 22.7 pg — ABNORMAL LOW (ref 26.0–34.0)
MCHC: 31.2 g/dL (ref 30.0–36.0)
MCV: 72.6 fL — ABNORMAL LOW (ref 78.0–100.0)
PLATELETS: 139 10*3/uL — AB (ref 150–400)
RBC: 4.81 MIL/uL (ref 3.87–5.11)
RDW: 15.2 % (ref 11.5–15.5)
WBC: 12.5 10*3/uL — ABNORMAL HIGH (ref 4.0–10.5)

## 2014-04-12 MED ORDER — HYDROMORPHONE HCL 1 MG/ML IJ SOLN
0.5000 mg | INTRAMUSCULAR | Status: DC | PRN
  Administered 2014-04-12 – 2014-04-13 (×5): 0.5 mg via INTRAVENOUS
  Filled 2014-04-12 (×5): qty 1

## 2014-04-12 MED ORDER — INSULIN ASPART 100 UNIT/ML ~~LOC~~ SOLN
0.0000 [IU] | Freq: Three times a day (TID) | SUBCUTANEOUS | Status: DC
  Administered 2014-04-12: 5 [IU] via SUBCUTANEOUS
  Administered 2014-04-12: 8 [IU] via SUBCUTANEOUS
  Administered 2014-04-13 (×2): 5 [IU] via SUBCUTANEOUS

## 2014-04-12 MED ORDER — INSULIN GLARGINE 100 UNIT/ML ~~LOC~~ SOLN: 30.0000 [IU] | Freq: Every day | SUBCUTANEOUS | Status: DC

## 2014-04-12 MED ORDER — DIVALPROEX SODIUM 125 MG PO CPSP
250.0000 mg | ORAL_CAPSULE | Freq: Two times a day (BID) | ORAL | Status: DC
  Administered 2014-04-12 – 2014-04-13 (×3): 250 mg via ORAL
  Filled 2014-04-12 (×5): qty 2

## 2014-04-12 MED ORDER — TRAMADOL HCL 50 MG PO TABS
50.0000 mg | ORAL_TABLET | Freq: Four times a day (QID) | ORAL | Status: DC | PRN
  Administered 2014-04-12: 50 mg via ORAL
  Filled 2014-04-12: qty 1

## 2014-04-12 MED ORDER — INSULIN GLARGINE 100 UNIT/ML ~~LOC~~ SOLN
30.0000 [IU] | Freq: Every day | SUBCUTANEOUS | Status: DC
  Administered 2014-04-12 – 2014-04-13 (×2): 30 [IU] via SUBCUTANEOUS
  Filled 2014-04-12 (×2): qty 0.3

## 2014-04-12 MED ORDER — METOCLOPRAMIDE HCL 10 MG PO TABS
10.0000 mg | ORAL_TABLET | Freq: Three times a day (TID) | ORAL | Status: DC
  Administered 2014-04-12 – 2014-04-13 (×6): 10 mg via ORAL
  Filled 2014-04-12 (×10): qty 1

## 2014-04-12 NOTE — Plan of Care (Signed)
Problem: Phase I Progression Outcomes Goal: OOB as tolerated unless otherwise ordered Outcome: Progressing Patient needs standby assist with toileting.

## 2014-04-12 NOTE — Care Management Note (Signed)
    Page 1 of 2   04/12/2014     11:17:55 AM CARE MANAGEMENT NOTE 04/12/2014  Patient:  Mary [name], Mary [name]   Account Number:  [id number]  Date Initiated:  04/12/2014  Documentation initiated by:  Landy Dunnavant  Subjective/Objective Assessment:   patient with known hx of dka repeated admission for same     Action/Plan:   home when stable /HRI intitated with [facility]   Anticipated DC Date:  04/13/2014   Anticipated DC Plan:  [place]  In-house referral  Financial Counselor  Clinical Social Worker      [facility]      [facility] Choice  NA  NA   Choice offered to / List presented to:  NA   DME arranged  NA      DME agency  NA     [place] arranged  HH-1 RN      [facility] agency  [place]   Status of service:  In process, will continue to follow Medicare Important Message given?   (If response is "NO", the following Medicare IM given date fields will be blank) Date Medicare IM given:   Medicare IM given by:   Date Additional Medicare IM given:   Additional Medicare IM given by:    Discharge Disposition:    Per UR Regulation:  Reviewed for med. necessity/level of care/duration of stay  If discussed at [place] of Stay Meetings, dates discussed:    Comments:  [id number]/Crystall Donaldson Rosana Hoes, RN, BSN, CCM Chart reviewed. Discharge needs and patient's stay to be reviewed and followed by case manager. reviewed in los meeting made hri with [facility]. hx: 43 y.o. 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  She was last admitted on 10/5 through 10/6 with DKA and discharged on levemir 30 units with 70/30 15 units  BID.  She took her 70/30 only because of a problem getting levemir and was seen in PCP clinic on 10/20 reporting CBG from 60 to 517.

## 2014-04-12 NOTE — Progress Notes (Addendum)
[facility] PROGRESS NOTE  Mary [name] DUK:[id number] DOB: [birth year] DOA: 04/11/2014 PCP: Mary Manning, NP  Brief summary  The patient is a 43 y.o. 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 VBG pH 7.25. Her CO2 17, AG 25, glucose 414. UA + ketones. WBC 10.8. CXR and KUB neg.  Transitioned off insulin gtt on 10/27.    Assessment/Plan  Uncontrolled DM with complications of gastroparesis, neuropathy, currently in DKA, A1c 11 on 10/20.   - Insulin gtt off 10/27 AM, gap has remained closed - Continue lantus 30 units with SSI  Abdominal pain, nausea and vomiting likely secondary to gastroparesis and DKA.   - transition to oral reglan and pain medication (tramadol) - Continue antiemetics -  Stop IVF to encourage eating  Diabetic neuropathy, stable, continue gabapentin  Transaminitis, stable, attributed to hep C.  Leukocytosis likely due to vomiting, dehydration and DKA. UA and CXR not suggestive of infection.  - IVF and antiemetics  Microcytic anemia. Previous iron studies with elevated iron saturation and iron level. TSH 1.64 wnl. Vit b12 1225, folate 909.  Severe protein-calorie malnutrition, from uncontrolled DM  - Diabetic diet  - Start glucerna  Seizures, stable, transition to oral depakote   Diet: diabetic  Access: PIV  IVF: off Proph: lovenox   Code Status: full  Family Communication: patient and friend  Disposition Plan:  Transfer to floor   Consultants:  None  Procedures:  CXR  Antibiotics:  none   HPI/Subjective:  States she feels the same, but more alert today.  Still has abdominal pain.  No vomiting since admission.  Denies cough,  SOB, diarrhea.    Objective: Filed Vitals:   04/12/14 0300 04/12/14 0400 04/12/14 0500 04/12/14 0600  BP: 105/54 120/67 121/70 123/69  Pulse: 100 96 94 93  Temp:  98.9 F (37.2 C)    TempSrc:  Oral    Resp: 16 17 16 15   Height:      Weight:  55.5 kg (122 lb 5.7 oz)    SpO2: 97% 97% 98% 98%    Intake/Output Summary (Last 24 hours) at 04/12/14 0730 Last data filed at 04/12/14 0600  Gross per 24 hour  Intake 1426.83 ml  Output   1300 ml  Net 126.83 ml   Filed Weights   04/11/14 1805 04/12/14 0400  Weight: 54.4 kg (119 lb 14.9 oz) 55.5 kg (122 lb 5.7 oz)    Exam:   General:  Thin BF, No acute distress  HEENT:  NCAT, MMM  Cardiovascular:  RRR, nl S1, S2 no mrg, 2+ pulses, warm extremities  Respiratory:  CTAB, no increased WOB  Abdomen:   NABS, soft, NT/ND on exam when distracted  MSK:   Normal tone and bulk, no LEE  Neuro:  Grossly intact  Data Reviewed: Basic Metabolic Panel:  Recent Labs Lab 04/11/14 1902 04/11/14 2130 04/11/14 2318 04/12/14 0038 04/12/14 0340  NA 147 146 141 145 141  K 3.6* 3.8 4.1 3.8 3.7  CL 110 110 107 110 107  CO2 24 22 22 23 24   GLUCOSE 188* 105* 199* 105* 183*  BUN 23 22 21 19 19   CREATININE 0.50 0.48* 0.48* 0.50 0.48*  CALCIUM 9.3 9.5 8.8 9.3 9.0   Liver Function  Tests:  Recent Labs Lab 04/11/14 1055  AST 37  ALT 36*  ALKPHOS 91  BILITOT 0.3  PROT 8.0  ALBUMIN 3.8    Recent Labs Lab 04/11/14 1055  LIPASE 14   No results found for this basename: AMMONIA,  in the last 168 hours CBC:  Recent Labs Lab 04/11/14 1055 04/11/14 1902 04/12/14 0340  WBC 10.8* 13.7* 12.5*  NEUTROABS 7.6  --   --   HGB 13.6 12.6 10.9*  HCT 43.4 39.4 34.9*  MCV 72.9* 71.9* 72.6*  PLT 131* 141* 139*   Cardiac Enzymes: No results found for this basename: CKTOTAL, CKMB, CKMBINDEX, TROPONINI,  in the last 168 hours BNP (last 3 results) No results found for this basename: PROBNP,  in the last 8760 hours CBG:  Recent Labs Lab  04/12/14 0126 04/12/14 0232 04/12/14 0337 04/12/14 0434 04/12/14 0557  GLUCAP 101* 146* 184* 177* 77    Recent Results (from the past 240 hour(s))  MRSA PCR SCREENING     Status: None   Collection Time    04/11/14  6:09 PM      Result Value Ref Range Status   MRSA by PCR NEGATIVE  NEGATIVE Final   Comment:            The GeneXpert MRSA Assay (FDA     approved for NASAL specimens     only), is one component of a     comprehensive MRSA colonization     surveillance program. It is not     intended to diagnose MRSA     infection nor to guide or     monitor treatment for     MRSA infections.     Studies: Dg Abd Acute W/chest  04/11/2014   CLINICAL DATA:  Abdominal pain, patient unable to provide additional history due to preceding medication administration. Personal history of diabetes, DKA, hypertension, hepatitis-C  EXAM: ACUTE ABDOMEN SERIES (ABDOMEN 2 VIEW & CHEST 1 VIEW)  COMPARISON:  03/21/2014  FINDINGS: Normal heart size, mediastinal contours and pulmonary vascularity.  Mild bullous disease changes at the RIGHT apex.  Lungs clear.  No pleural effusion or pneumothorax.  Thoracolumbar scoliosis.  Normal bowel gas pattern.  No bowel dilatation or bowel wall thickening or free intraperitoneal air.  Surgical clips RIGHT upper quadrant question cholecystectomy.  No urinary tract calcification.  IMPRESSION: Thoracolumbar scoliosis.  Mild bullous disease changes RIGHT apex.  No acute abdominal findings.   Electronically Signed   By: Lavonia Dana M.D.   On: 04/11/2014 10:21    Scheduled Meds: . aspirin EC  81 mg Oral Daily  . enoxaparin (LOVENOX) injection  40 mg Subcutaneous Q24H  . feeding supplement (GLUCERNA SHAKE)  237 mL Oral TID BM  . gabapentin  300 mg Oral TID  . insulin aspart  0-15 Units Subcutaneous TID WC  . insulin glargine  30 Units Subcutaneous Daily  . metoCLOPramide (REGLAN) injection  10 mg Intravenous TID AC & HS  . risperiDONE  2 mg Oral QHS  . valproate sodium   250 mg Intravenous Q12H   Continuous Infusions: . sodium chloride 100 mL/hr at 04/12/14 5400    Active Problems:   DKA (diabetic ketoacidoses)   Hyperglycemia    Time spent: 30 min    [name], [facility]  [facility] Pager [phone]. If 7PM-7AM, please contact night-coverage at [email or web address], password [facility] 04/12/2014, 7:30 AM  LOS: 1 day

## 2014-04-12 NOTE — Progress Notes (Signed)
Patient arrived to unit. Alert and orientedx4. Kept comfortably in bed. Will continue to monitor the patient.-  Sandie Ano RN

## 2014-04-12 NOTE — Progress Notes (Signed)
Inpatient Diabetes Program Recommendations  AACE/ADA: New Consensus Statement on Inpatient Glycemic Control (2013)  Target Ranges:  Prepandial:   less than 140 mg/dL      Peak postprandial:   less than 180 mg/dL (1-2 hours)      Critically ill patients:  140 - 180 mg/dL   Reason for Visit: Diabetes Consult  Diabetes history: DM1 Outpatient Diabetes medications: Lantus 30 units QHS, 70/30 15 units bid Current orders for Inpatient glycemic control: Lantus 30 units QD, Novolog moderate tidwc  "I don't want to talk right now. I'm hurting." Very familiar with 43 year old female (11th admission in past 6 months) Admitted with nausea, vomiting and DKA. Was last seen at [facility] on 04/05/2014. Pt states she has insulin at home and did not miss a dose. Noncompliance with insulin, etc. Has been counseled extensively in the past regarding the importance of blood sugar control at home.   DKA resolved and was transitioned to basal bolus insulin. HgbA1C improved from 13.2% on 12/23/2013 to 11.0% on 04/05/2014 - although H/H is low, therefore may not be accurate.  Recommendations: Consider addition of 70/30 8 units bid (1/2 home dose) Decrease Novolog to sensitive tidwc and hs since pt is Type 1 DM. ? Psych consult - ? Safety of pt at home. Very difficult diabetes management situation.  Discussed above with RN. Will follow daily. Thank you. Lorenda Peck, RD, LDN, CDE Inpatient Diabetes Coordinator [phone]

## 2014-04-12 NOTE — Progress Notes (Signed)
Report called and given to Adonis Brook, 3rd floor. Pt to be transferred to rm [id number]. Vw, rn.

## 2014-04-13 DIAGNOSIS — I10 Essential (primary) hypertension: Secondary | ICD-10-CM

## 2014-04-13 LAB — BASIC METABOLIC PANEL
Anion gap: 14 (ref 5–15)
BUN: 12 mg/dL (ref 6–23)
CO2: 22 meq/L (ref 19–32)
Calcium: 8.8 mg/dL (ref 8.4–10.5)
Chloride: 105 mEq/L (ref 96–112)
Creatinine, Ser: 0.45 mg/dL — ABNORMAL LOW (ref 0.50–1.10)
GFR calc Af Amer: >90 mL/min (ref >90)
GFR calc non Af Amer: >90 mL/min (ref >90)
GLUCOSE: 233 mg/dL — AB (ref 70–99)
POTASSIUM: 4.2 meq/L (ref 3.7–5.3)
SODIUM: 141 meq/L (ref 137–147)

## 2014-04-13 LAB — CBC
HEMATOCRIT: 34.7 % — AB (ref 36.0–46.0)
HEMOGLOBIN: 11.2 g/dL — AB (ref 12.0–15.0)
MCH: 23.2 pg — AB (ref 26.0–34.0)
MCHC: 32.3 g/dL (ref 30.0–36.0)
MCV: 71.8 fL — AB (ref 78.0–100.0)
PLATELETS: 110 10*3/uL — AB (ref 150–400)
RBC: 4.83 MIL/uL (ref 3.87–5.11)
RDW: 14.9 % (ref 11.5–15.5)
WBC: 10.2 10*3/uL (ref 4.0–10.5)

## 2014-04-13 LAB — GLUCOSE, CAPILLARY
Glucose-Capillary: 236 mg/dL — ABNORMAL HIGH (ref 70–99)
Glucose-Capillary: 243 mg/dL — ABNORMAL HIGH (ref 70–99)

## 2014-04-13 MED ORDER — INSULIN NPH ISOPHANE & REGULAR (70-30) 100 UNIT/ML ~~LOC~~ SUSP: 8.0000 [IU] | Freq: Two times a day (BID) | SUBCUTANEOUS | Status: DC

## 2014-04-13 MED ORDER — METOCLOPRAMIDE HCL 10 MG PO TABS
10.0000 mg | ORAL_TABLET | Freq: Three times a day (TID) | ORAL | Status: DC
Start: 2014-04-13 — End: 2014-07-31

## 2014-04-13 NOTE — Discharge Summary (Addendum)
Physician Discharge Summary  Mary [name] DGU:[id number] DOB: 03-01-1971 DOA: 04/11/2014  PCP: [name], NP  Admit date: 04/11/2014 Discharge date: 04/13/2014  Time spent: 35 minutes  Recommendations for Outpatient Follow-up:  1. Follow up with PCP in 1-2 weeks  Discharge Diagnoses:  Active Problems:   DKA (diabetic ketoacidoses)   Hyperglycemia  Discharge Condition: Improved  Diet recommendation: Diabetic  Filed Weights   04/11/14 1805 04/12/14 0400 04/12/14 1555  Weight: 54.4 kg (119 lb 14.9 oz) 55.5 kg (122 lb 5.7 oz) 55.5 kg (122 lb 5.7 oz)   History of present illness:  Please see admit h and p from 10/26 for details. Briefly, pt presents with abd pain with nausea/vomiting, found to be in DKA. Pt was admitted for further work up.  Hospital Course:  Uncontrolled DM with complications of gastroparesis, neuropathy, currently in DKA, A1c 11 on 10/20.  - Insulin gtt off 10/27 AM, gap has remained closed  - Continue lantus 30 units with SSI coverage - Pt seen by Diabetic Coordinator, recommending addition of 70/30 at 8 units BID in addition to Lantus Abdominal pain, nausea and vomiting likely secondary to gastroparesis and DKA.  - transitioned to oral reglan and pain medication (tramadol)  - Continue antiemetics  - Stopped IVF to encourage eating  Diabetic neuropathy, stable, continue gabapentin  Transaminitis, stable, attributed to hep C.  Leukocytosis likely due to vomiting, dehydration and DKA. UA and CXR not suggestive of infection.  - IVF and antiemetics  Microcytic anemia. Previous iron studies with elevated iron saturation and iron level. TSH 1.64 wnl. Vit b12 1225, folate 909.  Severe protein-calorie malnutrition, from uncontrolled DM  - Diabetic diet  - Start glucerna  Seizures, stable, transition to oral depakote   Consultations:  Diabetic coordinator  Discharge Exam: Filed Vitals:   04/12/14 1500 04/12/14 1555 04/13/14 0600 04/13/14 1329  BP:  133/80 130/76 143/82 138/83  Pulse: 77 87 82   Temp:  98.3 F (36.8 C) 98 F (36.7 C) 98.8 F (37.1 C)  TempSrc:  Oral Oral Oral  Resp: 18 16 15 16   Height:  5\' 4"  (1.626 m)    Weight:  55.5 kg (122 lb 5.7 oz)    SpO2: 99% 100% 100% 100%    General: Awake, in nad Cardiovascular: regular, s1, s2 Respiratory: normal resp effort, no wheezing  Discharge Instructions     Medication List         aspirin EC 81 MG tablet  Take 81 mg by mouth daily.     divalproex 250 MG DR tablet  Commonly known as:  DEPAKOTE  Take 250 mg by mouth 2 times daily at 12 noon and 4 pm.     gabapentin 300 MG capsule  Commonly known as:  NEURONTIN  Take 300 mg by mouth 3 (three) times daily.     insulin glargine 100 UNIT/ML injection  Commonly known as:  LANTUS  Inject 30 Units into the skin at bedtime.     insulin NPH-regular Human (70-30) 100 UNIT/ML injection  Commonly known as:  NOVOLIN 70/30  Inject 8 Units into the skin 2 (two) times daily with a meal.     medroxyPROGESTERone 150 MG/ML injection  Commonly known as:  DEPO-PROVERA  Inject 150 mg into the muscle every 3 (three) months.     metoCLOPramide 10 MG tablet  Commonly known as:  REGLAN  Take 1 tablet (10 mg total) by mouth 4 (four) times daily -  before meals and at bedtime.  risperiDONE 2 MG tablet  Commonly known as:  RISPERDAL  Take 2 mg by mouth at bedtime.     traMADol 50 MG tablet  Commonly known as:  ULTRAM  Take 50 mg by mouth every 8 (eight) hours as needed.       Allergies  Allergen Reactions  . Penicillins Rash   Follow-up Information   Follow up with [name], NP. Schedule an appointment as soon as possible for a visit in 1 week.   Specialty:  Internal Medicine   Contact information:   [place] [place] [id number] [phone]       Schedule an appointment as soon as possible for a visit to follow up.       The results of significant diagnostics from this hospitalization  (including imaging, microbiology, ancillary and laboratory) are listed below for reference.    Significant Diagnostic Studies: Dg Abd Acute W/chest  04/11/2014   CLINICAL DATA:  Abdominal pain, patient unable to provide additional history due to preceding medication administration. Personal history of diabetes, DKA, hypertension, hepatitis-C  EXAM: ACUTE ABDOMEN SERIES (ABDOMEN 2 VIEW & CHEST 1 VIEW)  COMPARISON:  03/21/2014  FINDINGS: Normal heart size, mediastinal contours and pulmonary vascularity.  Mild bullous disease changes at the RIGHT apex.  Lungs clear.  No pleural effusion or pneumothorax.  Thoracolumbar scoliosis.  Normal bowel gas pattern.  No bowel dilatation or bowel wall thickening or free intraperitoneal air.  Surgical clips RIGHT upper quadrant question cholecystectomy.  No urinary tract calcification.  IMPRESSION: Thoracolumbar scoliosis.  Mild bullous disease changes RIGHT apex.  No acute abdominal findings.   Electronically Signed   By: Lavonia Dana M.D.   On: 04/11/2014 10:21   Dg Abd Acute W/chest  03/21/2014   CLINICAL DATA:  Abdominal pain and nausea; seizure disorder  EXAM: ACUTE ABDOMEN SERIES (ABDOMEN 2 VIEW & CHEST 1 VIEW)  COMPARISON:  Chest radiograph December 23, 2013; CT abdomen and pelvis March 08, 2014  FINDINGS: PA chest: Lungs are clear. Heart size and pulmonary vascularity are normal. No adenopathy. There is thoracolumbar levoscoliosis.  Supine and left lateral decubitus abdomen images: The bowel gas pattern is unremarkable. No obstruction or free air. There is lumbar levoscoliosis.  IMPRESSION: Bowel gas pattern unremarkable. No demonstrable obstruction or free air.   Electronically Signed   By: Lowella Grip M.D.   On: 03/21/2014 14:47    Microbiology: Recent Results (from the past 240 hour(s))  URINE CULTURE     Status: None   Collection Time    04/11/14 12:38 PM      Result Value Ref Range Status   Specimen Description URINE, CATHETERIZED   Final    Special Requests NONE   Final   Culture  Setup Time     Final   Value: 04/11/2014 21:31     Performed at [place]     Final   Value: NO GROWTH     Performed at [facility]   Culture     Final   Value: NO GROWTH     Performed at [facility]   Report Status 04/12/2014 FINAL   Final  MRSA PCR SCREENING     Status: None   Collection Time    04/11/14  6:09 PM      Result Value Ref Range Status   MRSA by PCR NEGATIVE  NEGATIVE Final   Comment:  The GeneXpert MRSA Assay (FDA     approved for NASAL specimens     only), is one component of a     comprehensive MRSA colonization     surveillance program. It is not     intended to diagnose MRSA     infection nor to guide or     monitor treatment for     MRSA infections.     Labs: Basic Metabolic Panel:  Recent Labs Lab 04/11/14 2130 04/11/14 2318 04/12/14 0038 04/12/14 0340 04/13/14 0444  NA 146 141 145 141 141  K 3.8 4.1 3.8 3.7 4.2  CL 110 107 110 107 105  CO2 22 22 23 24 22   GLUCOSE 105* 199* 105* 183* 233*  BUN 22 21 19 19 12   CREATININE 0.48* 0.48* 0.50 0.48* 0.45*  CALCIUM 9.5 8.8 9.3 9.0 8.8   Liver Function Tests:  Recent Labs Lab 04/11/14 1055  AST 37  ALT 36*  ALKPHOS 91  BILITOT 0.3  PROT 8.0  ALBUMIN 3.8    Recent Labs Lab 04/11/14 1055  LIPASE 14   No results found for this basename: AMMONIA,  in the last 168 hours CBC:  Recent Labs Lab 04/11/14 1055 04/11/14 1902 04/12/14 0340 04/13/14 0444  WBC 10.8* 13.7* 12.5* 10.2  NEUTROABS 7.6  --   --   --   HGB 13.6 12.6 10.9* 11.2*  HCT 43.4 39.4 34.9* 34.7*  MCV 72.9* 71.9* 72.6* 71.8*  PLT 131* 141* 139* 110*   Cardiac Enzymes: No results found for this basename: CKTOTAL, CKMB, CKMBINDEX, TROPONINI,  in the last 168 hours BNP: BNP (last 3 results) No results found for this basename: PROBNP,  in the last 8760 hours CBG:  Recent Labs Lab 04/12/14 1212 04/12/14 1714  04/12/14 2124 04/13/14 0739 04/13/14 1215  GLUCAP 265* 221* 159* 236* 243*    Signed:  Allecia Bells K  [facility] 04/13/2014, 3:15 PM

## 2014-04-13 NOTE — Plan of Care (Signed)
Problem: Consults Goal: Diabetic Ketoacidosis (DKA) Patient Education See Patient Education Modules for education specifics.  Outcome: Progressing Patient was given care notes re: DKA, verbalized understanding.

## 2014-04-18 ENCOUNTER — Encounter (HOSPITAL_COMMUNITY): Payer: Self-pay | Admitting: Emergency Medicine

## 2014-04-19 ENCOUNTER — Ambulatory Visit: Payer: Medicaid Other | Attending: Internal Medicine | Admitting: *Deleted

## 2014-04-19 DIAGNOSIS — IMO0002 Reserved for concepts with insufficient information to code with codable children: Secondary | ICD-10-CM

## 2014-04-19 DIAGNOSIS — E1065 Type 1 diabetes mellitus with hyperglycemia: Secondary | ICD-10-CM | POA: Insufficient documentation

## 2014-04-19 LAB — GLUCOSE, POCT (MANUAL RESULT ENTRY): POC Glucose: 464 mg/dl — AB (ref 70–99)

## 2014-04-19 NOTE — Progress Notes (Unsigned)
Patient presents for CBG check and log review; copy made Patient reports recent hospitalization for gastroparesis. Discharged 04/13/14 States insulin dosages were changed at that time. Patient states she now takes Lantus 30 units daily in AM and Novolin 70/30 8 units bid before meals Since last visit, home CBGs ranging 88-565 AM fasting and 60- 362 before dinner Patient states she has not checked CBG this AM and has not taken any insulin however she did eat eggs and grits. Reinforced importance of taking insulin as directed  Per provider patient instructed to take AM doses of insulin as soon as she arrives home Appt scheduled with PCP for HFU on 04/26/14 at 4:15 PM

## 2014-04-26 ENCOUNTER — Other Ambulatory Visit: Payer: Self-pay

## 2014-04-26 ENCOUNTER — Ambulatory Visit: Payer: Medicaid Other | Attending: Internal Medicine | Admitting: Internal Medicine

## 2014-04-26 ENCOUNTER — Encounter: Payer: Self-pay | Admitting: Internal Medicine

## 2014-04-26 VITALS — BP 114/76 | HR 106 | Temp 98.6°F | Resp 99 | Ht 64.0 in | Wt 126.0 lb

## 2014-04-26 DIAGNOSIS — E108 Type 1 diabetes mellitus with unspecified complications: Secondary | ICD-10-CM | POA: Diagnosis not present

## 2014-04-26 DIAGNOSIS — G40909 Epilepsy, unspecified, not intractable, without status epilepticus: Secondary | ICD-10-CM | POA: Diagnosis not present

## 2014-04-26 DIAGNOSIS — Z8619 Personal history of other infectious and parasitic diseases: Secondary | ICD-10-CM | POA: Diagnosis not present

## 2014-04-26 DIAGNOSIS — Z833 Family history of diabetes mellitus: Secondary | ICD-10-CM | POA: Diagnosis not present

## 2014-04-26 DIAGNOSIS — E1040 Type 1 diabetes mellitus with diabetic neuropathy, unspecified: Secondary | ICD-10-CM | POA: Insufficient documentation

## 2014-04-26 DIAGNOSIS — Z7982 Long term (current) use of aspirin: Secondary | ICD-10-CM | POA: Diagnosis not present

## 2014-04-26 DIAGNOSIS — I10 Essential (primary) hypertension: Secondary | ICD-10-CM | POA: Insufficient documentation

## 2014-04-26 DIAGNOSIS — Z79899 Other long term (current) drug therapy: Secondary | ICD-10-CM | POA: Insufficient documentation

## 2014-04-26 DIAGNOSIS — Z794 Long term (current) use of insulin: Secondary | ICD-10-CM | POA: Insufficient documentation

## 2014-04-26 DIAGNOSIS — Z720 Tobacco use: Secondary | ICD-10-CM | POA: Diagnosis not present

## 2014-04-26 DIAGNOSIS — F17210 Nicotine dependence, cigarettes, uncomplicated: Secondary | ICD-10-CM | POA: Insufficient documentation

## 2014-04-26 DIAGNOSIS — F17200 Nicotine dependence, unspecified, uncomplicated: Secondary | ICD-10-CM

## 2014-04-26 LAB — GLUCOSE, POCT (MANUAL RESULT ENTRY): POC Glucose: 265 mg/dl — AB (ref 70–99)

## 2014-04-26 NOTE — Progress Notes (Signed)
Pt comes in to f/u with Diabetes Type 1 with insulin management  Pt is taking Lantus  30 units in am and Novolin 70/30 8 units BID States she is compliant with injections Log book range has been high 200's- 350's Slight headache with urine freq noted Pt was seen today for visual screening and told she will need Opthalmology   referral

## 2014-04-26 NOTE — Progress Notes (Addendum)
Patient ID: Mary [name], female   DOB: March 15, 1971, 43 y.o.   MRN: [id number]  CC: DM follow up   HPI:  Mary [name] was discharged from hospital two weeks ago for DKA and gastroparesis.  She reports that the inpatient physician directed her to begin taking 30 units of Lantus in the morning and to continue taking Novolin 70/30 8 units twice daily.  Her cbg log reveals readings ranging from 65 to 565.  Her log book is very inconsistent with several skipped days or only one reading per day.    Allergies  Allergen Reactions  . Penicillins Rash   Past Medical History  Diagnosis Date  . Kidney stone   . Seizure disorder     started with pregnancy of first son  . Gastritis   . Bipolar 2 disorder   . Post traumatic stress disorder     "flipping out" after people close to her died  . Heart murmur   . Arthritis     r ankle-S/P surgery (2007)  . Anemia     childhood  . Depression     childhood  . Hepatitis C     biopsy in 2010-no rx-was supposed to see a hepatologist in [place].  With cirrhosis  . Neuropathy     in legs and feet and hands  . Cataracts, bilateral   . Scoliosis   . Diabetes mellitus     diagnosed in 1996-always been on insulin  . DKA (diabetic ketoacidoses)     Recurrent admissions for DKA, medication non-complaince, poor social situation  . Diabetic neuropathy, type I diabetes mellitus     numbness bilaterally feet  . HTN (hypertension) 10/12/2013   Current Outpatient Prescriptions on File Prior to Visit  Medication Sig Dispense Refill  . aspirin EC 81 MG tablet Take 81 mg by mouth daily.    . divalproex (DEPAKOTE) 250 MG DR tablet Take 250 mg by mouth 2 times daily at 12 noon and 4 pm.    . gabapentin (NEURONTIN) 300 MG capsule Take 300 mg by mouth 3 (three) times daily.    . insulin glargine (LANTUS) 100 UNIT/ML injection Inject 30 Units into the skin at bedtime.    . insulin NPH-regular Human (NOVOLIN 70/30) (70-30) 100 UNIT/ML injection Inject 8 Units  into the skin 2 (two) times daily with a meal. 10 mL 0  . metoCLOPramide (REGLAN) 10 MG tablet Take 1 tablet (10 mg total) by mouth 4 (four) times daily -  before meals and at bedtime. 90 tablet 0  . risperiDONE (RISPERDAL) 2 MG tablet Take 2 mg by mouth at bedtime.    . traMADol (ULTRAM) 50 MG tablet Take 50 mg by mouth every 8 (eight) hours as needed.    . medroxyPROGESTERone (DEPO-PROVERA) 150 MG/ML injection Inject 150 mg into the muscle every 3 (three) months.     No current facility-administered medications on file prior to visit.   Family History  Problem Relation Age of Onset  . Diabetes Mother     currently 66  . Fibromyalgia Mother   . Cirrhosis Father     died in 55  . Diabetes Maternal Grandmother   . Diabetes Maternal Aunt    History   Social History  . Marital Status: Single    Spouse Name: N/A    Number of Children: N/A  . Years of Education: N/A   Occupational History  . Not on file.   Social History Main Topics  . Smoking  status: Current Every Day Smoker -- 0.10 packs/day for 25 years    Types: Cigarettes  . Smokeless tobacco: Never Used  . Alcohol Use: No     Comment: Endorses hasn't been drinking  . Drug Use: 1.00 per week    Special: Marijuana     Comment: used crack last year.  every now and then uses puffs some marijuana-only on the weekends<?>  . Sexual Activity: Yes    Birth Control/ Protection: None   Other Topics Concern  . 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 Came back to [place] from [place] [facility]-was in rehab for 3 mo. Then [facility] to [facility] Currently 05/2011 endorses not doing any drugs except very occasional marijuana when she's nauseous.    Lives in [place] with BF, has grown children, 2 sons. Ambulatory.                Review of Systems  Eyes: Negative for blurred vision.  Respiratory: Negative.   Cardiovascular: Negative.    Gastrointestinal: Positive for nausea and abdominal pain. Negative for heartburn and vomiting.  Neurological: Negative for dizziness, tingling, weakness and headaches.    Objective:   Filed Vitals:   04/26/14 1617  BP: 114/76  Pulse: 106  Temp: 98.6 F (37 C)  Resp: 99    Physical Exam  Constitutional: She is oriented to person, place, and time.  Cardiovascular: Normal rate, regular rhythm and normal heart sounds.   Pulmonary/Chest: Effort normal and breath sounds normal.  Abdominal: Soft. Bowel sounds are normal.  Musculoskeletal: She exhibits no edema.  Neurological: She is alert and oriented to person, place, and time.  Skin: Skin is warm. She is not diaphoretic.     Lab Results  Component Value Date   WBC 10.2 04/13/2014   HGB 11.2* 04/13/2014   HCT 34.7* 04/13/2014   MCV 71.8* 04/13/2014   PLT 110* 04/13/2014   Lab Results  Component Value Date   CREATININE 0.45* 04/13/2014   BUN 12 04/13/2014   NA 141 04/13/2014   K 4.2 04/13/2014   CL 105 04/13/2014   CO2 22 04/13/2014    Lab Results  Component Value Date   HGBA1C 11 04/05/2014   Lipid Panel     Component Value Date/Time   CHOL  04/16/2010 0157    134        ATP III CLASSIFICATION:  <200     mg/dL   Desirable  200-239  mg/dL   Borderline High  >=240    mg/dL   High          TRIG 185* 04/16/2010 0157   HDL 40 04/16/2010 0157   CHOLHDL 3.4 04/16/2010 0157   VLDL 37 04/16/2010 0157   LDLCALC  04/16/2010 0157    57        Total Cholesterol/HDL:CHD Risk Coronary Heart Disease Risk Table                     Men   Women  1/2 Average Risk   3.4   3.3  Average Risk       5.0   4.4  2 X Average Risk   9.6   7.1  3 X Average Risk  23.4   11.0        Use the calculated Patient Ratio above and the CHD Risk Table to determine the patient's CHD Risk.  ATP III CLASSIFICATION (LDL):  <100     mg/dL   Optimal  100-129  mg/dL   Near or Above                    Optimal  130-159  mg/dL    Borderline  160-189  mg/dL   High  >190     mg/dL   Very High       Assessment and plan:   Mary [name] was seen today for follow-up and diabetes.  Diagnoses and associated orders for this visit:  Type 1 diabetes mellitus with complications - Glucose (CBG) Blood sugars are sporadic, will continue on same medication dose and continue to monitor. Explained the importance of consistency with charting blood sugars in order to make appropriate changes to regimen.    Essential hypertension Patient blood pressure is stable and may continue on current medication.  Education on diet, exercise, and modifiable risk factors discussed. Will obtain appropriate labs as needed. Will follow up in 3-6 months.   Smoking Smoking cessation discussed for 3 minutes, patient is not willing to quit at this time. Will continue to assess on each visit. Discussed increased risk for diseases such as cancer, heart disease, and stroke.     Return in about 2 weeks (around 05/10/2014) for Nurse Visit-cbg log check.        [name], NP-C [facility] [phone] 04/26/2014, 4:39 PM

## 2014-04-29 ENCOUNTER — Other Ambulatory Visit: Payer: Self-pay

## 2014-05-09 ENCOUNTER — Ambulatory Visit: Payer: Self-pay

## 2014-05-10 ENCOUNTER — Encounter (HOSPITAL_COMMUNITY): Payer: Self-pay | Admitting: Emergency Medicine

## 2014-05-10 ENCOUNTER — Emergency Department (HOSPITAL_COMMUNITY)
Admission: EM | Admit: 2014-05-10 | Discharge: 2014-05-10 | Disposition: A | Discharge status: Home / Self Care | Payer: Self-pay | Attending: Emergency Medicine | Admitting: Emergency Medicine

## 2014-05-10 ENCOUNTER — Emergency Department (HOSPITAL_COMMUNITY): Payer: Self-pay

## 2014-05-10 DIAGNOSIS — Z9889 Other specified postprocedural states: Secondary | ICD-10-CM | POA: Insufficient documentation

## 2014-05-10 DIAGNOSIS — Z7982 Long term (current) use of aspirin: Secondary | ICD-10-CM | POA: Insufficient documentation

## 2014-05-10 DIAGNOSIS — Z720 Tobacco use: Secondary | ICD-10-CM | POA: Insufficient documentation

## 2014-05-10 DIAGNOSIS — M419 Scoliosis, unspecified: Secondary | ICD-10-CM | POA: Insufficient documentation

## 2014-05-10 DIAGNOSIS — G40909 Epilepsy, unspecified, not intractable, without status epilepticus: Secondary | ICD-10-CM | POA: Insufficient documentation

## 2014-05-10 DIAGNOSIS — R000 Tachycardia, unspecified: Secondary | ICD-10-CM | POA: Insufficient documentation

## 2014-05-10 DIAGNOSIS — Z79818 Long term (current) use of other agents affecting estrogen receptors and estrogen levels: Secondary | ICD-10-CM | POA: Insufficient documentation

## 2014-05-10 DIAGNOSIS — F319 Bipolar disorder, unspecified: Secondary | ICD-10-CM | POA: Insufficient documentation

## 2014-05-10 DIAGNOSIS — E1065 Type 1 diabetes mellitus with hyperglycemia: Secondary | ICD-10-CM | POA: Insufficient documentation

## 2014-05-10 DIAGNOSIS — R0682 Tachypnea, not elsewhere classified: Secondary | ICD-10-CM | POA: Insufficient documentation

## 2014-05-10 DIAGNOSIS — K3184 Gastroparesis: Secondary | ICD-10-CM | POA: Insufficient documentation

## 2014-05-10 DIAGNOSIS — R011 Cardiac murmur, unspecified: Secondary | ICD-10-CM | POA: Insufficient documentation

## 2014-05-10 DIAGNOSIS — Z79899 Other long term (current) drug therapy: Secondary | ICD-10-CM | POA: Insufficient documentation

## 2014-05-10 DIAGNOSIS — E1040 Type 1 diabetes mellitus with diabetic neuropathy, unspecified: Secondary | ICD-10-CM | POA: Insufficient documentation

## 2014-05-10 DIAGNOSIS — Z8619 Personal history of other infectious and parasitic diseases: Secondary | ICD-10-CM | POA: Insufficient documentation

## 2014-05-10 DIAGNOSIS — Z87442 Personal history of urinary calculi: Secondary | ICD-10-CM | POA: Insufficient documentation

## 2014-05-10 DIAGNOSIS — R739 Hyperglycemia, unspecified: Secondary | ICD-10-CM

## 2014-05-10 DIAGNOSIS — Z794 Long term (current) use of insulin: Secondary | ICD-10-CM | POA: Insufficient documentation

## 2014-05-10 DIAGNOSIS — I10 Essential (primary) hypertension: Secondary | ICD-10-CM | POA: Insufficient documentation

## 2014-05-10 DIAGNOSIS — Z880 Allergy status to penicillin: Secondary | ICD-10-CM | POA: Insufficient documentation

## 2014-05-10 DIAGNOSIS — Z862 Personal history of diseases of the blood and blood-forming organs and certain disorders involving the immune mechanism: Secondary | ICD-10-CM | POA: Insufficient documentation

## 2014-05-10 DIAGNOSIS — Z9049 Acquired absence of other specified parts of digestive tract: Secondary | ICD-10-CM | POA: Insufficient documentation

## 2014-05-10 DIAGNOSIS — R109 Unspecified abdominal pain: Secondary | ICD-10-CM

## 2014-05-10 LAB — URINALYSIS, ROUTINE W REFLEX MICROSCOPIC
Bilirubin Urine: NEGATIVE
Glucose, UA: >1000 mg/dL — AB
Hgb urine dipstick: NEGATIVE
Ketones, ur: 40 mg/dL — AB
LEUKOCYTES UA: NEGATIVE
NITRITE: NEGATIVE
PROTEIN: NEGATIVE mg/dL
Specific Gravity, Urine: 1.037 — ABNORMAL HIGH (ref 1.005–1.030)
Urobilinogen, UA: 0.2 mg/dL (ref 0.0–1.0)
pH: 5.5 (ref 5.0–8.0)

## 2014-05-10 LAB — CBC
HCT: 41.2 % (ref 36.0–46.0)
Hemoglobin: 13.4 g/dL (ref 12.0–15.0)
MCH: 23.2 pg — AB (ref 26.0–34.0)
MCHC: 32.5 g/dL (ref 30.0–36.0)
MCV: 71.3 fL — AB (ref 78.0–100.0)
PLATELETS: 165 10*3/uL (ref 150–400)
RBC: 5.78 MIL/uL — AB (ref 3.87–5.11)
RDW: 16 % — ABNORMAL HIGH (ref 11.5–15.5)
WBC: 10.7 10*3/uL — ABNORMAL HIGH (ref 4.0–10.5)

## 2014-05-10 LAB — COMPREHENSIVE METABOLIC PANEL
ALT: 81 U/L — ABNORMAL HIGH (ref 0–35)
AST: 93 U/L — AB (ref 0–37)
Albumin: 4.6 g/dL (ref 3.5–5.2)
Alkaline Phosphatase: 109 U/L (ref 39–117)
Anion gap: 22 — ABNORMAL HIGH (ref 5–15)
BUN: 17 mg/dL (ref 6–23)
CALCIUM: 10.9 mg/dL — AB (ref 8.4–10.5)
CO2: 21 meq/L (ref 19–32)
CREATININE: 0.6 mg/dL (ref 0.50–1.10)
Chloride: 97 mEq/L (ref 96–112)
GFR calc non Af Amer: >90 mL/min (ref >90)
Glucose, Bld: 395 mg/dL — ABNORMAL HIGH (ref 70–99)
Potassium: 4.5 mEq/L (ref 3.7–5.3)
Sodium: 140 mEq/L (ref 137–147)
TOTAL PROTEIN: 9.2 g/dL — AB (ref 6.0–8.3)
Total Bilirubin: 0.5 mg/dL (ref 0.3–1.2)

## 2014-05-10 LAB — CBG MONITORING, ED
Glucose-Capillary: 368 mg/dL — ABNORMAL HIGH (ref 70–99)
Glucose-Capillary: 256 mg/dL — ABNORMAL HIGH (ref 70–99)

## 2014-05-10 LAB — URINE MICROSCOPIC-ADD ON

## 2014-05-10 LAB — LIPASE, BLOOD: LIPASE: 13 U/L (ref 11–59)

## 2014-05-10 MED ORDER — PROMETHAZINE HCL 12.5 MG PO TABS: 12.5000 mg | ORAL_TABLET | Freq: Four times a day (QID) | ORAL | Status: DC | PRN

## 2014-05-10 MED ORDER — ONDANSETRON HCL 4 MG/2ML IJ SOLN
4.0000 mg | Freq: Once | INTRAMUSCULAR | Status: AC
  Administered 2014-05-10: 4 mg via INTRAVENOUS
  Filled 2014-05-10: qty 2

## 2014-05-10 MED ORDER — SODIUM CHLORIDE 0.9 % IV BOLUS (SEPSIS)
1000.0000 mL | Freq: Once | INTRAVENOUS | Status: AC
  Administered 2014-05-10: 1000 mL via INTRAVENOUS

## 2014-05-10 MED ORDER — INSULIN ASPART 100 UNIT/ML ~~LOC~~ SOLN
10.0000 [IU] | Freq: Once | SUBCUTANEOUS | Status: AC
  Administered 2014-05-10: 10 [IU] via SUBCUTANEOUS
  Filled 2014-05-10 (×2): qty 1

## 2014-05-10 MED ORDER — HYDROMORPHONE HCL 1 MG/ML IJ SOLN
1.0000 mg | Freq: Once | INTRAMUSCULAR | Status: AC
  Administered 2014-05-10: 1 mg via INTRAVENOUS
  Filled 2014-05-10: qty 1

## 2014-05-10 NOTE — Discharge Instructions (Signed)
Please call your doctor for a followup appointment within 24-48 hours. When you talk to your doctor please let them know that you were seen in the emergency department and have them acquire all of your records so that they can discuss the findings with you and formulate a treatment plan to fully care for your new and ongoing problems. ° °

## 2014-05-10 NOTE — ED Provider Notes (Signed)
CSN: [id number]     Arrival date & time 05/10/14  1904 History   First MD Initiated Contact with Patient 05/10/14 1905     Chief Complaint  Patient presents with  . Abdominal Pain     (Consider location/radiation/quality/duration/timing/severity/associated sxs/prior Treatment) HPI Comments: The patient is a 43 year old female, she is a known diabetic, she is also known to have gastroparesis, seizure disorder, hepatitis C and has had over 10 admissions to the hospital over the last 7 months. Most of these admissions her for symptomatic nausea vomiting and abdominal pain and for diabetic ketoacidosis. She states that this morning she started to have gradual onset of abdominal pain which is in the periumbilical region, she states it is her whole abdomen, it has been waxing and waning throughout the day, comes in short waves, worsens at times and is associated with nausea vomiting. She denies any fevers chills coughing shortness of breath or lower extremity swelling. She denies dysuria diarrhea or blood in the stools. She has had no medications prior to arrival, she is feeling so nauseated she cannot tolerate her home medications. She states she has been taking her insulin as indicated, she is unsure of her blood sugar today.  Patient is a 43 y.o. female presenting with abdominal pain. The history is provided by the patient.  Abdominal Pain   Past Medical History  Diagnosis Date  . Kidney stone   . Seizure disorder     started with pregnancy of first son  . Gastritis   . Bipolar 2 disorder   . Post traumatic stress disorder     "flipping out" after people close to her died  . Heart murmur   . Arthritis     r ankle-S/P surgery (2007)  . Anemia     childhood  . Depression     childhood  . Hepatitis C     biopsy in 2010-no rx-was supposed to see a hepatologist in [place].  With cirrhosis  . Neuropathy     in legs and feet and hands  . Cataracts, bilateral   . Scoliosis   .  Diabetes mellitus     diagnosed in 1996-always been on insulin  . DKA (diabetic ketoacidoses)     Recurrent admissions for DKA, medication non-complaince, poor social situation  . Diabetic neuropathy, type I diabetes mellitus     numbness bilaterally feet  . HTN (hypertension) 10/12/2013   Past Surgical History  Procedure Laterality Date  . Ankle surgery  2007  . Endometrial biopsy  06/22/2012  . Cholecystectomy  04/07/2013  . Cholecystectomy N/A 04/07/2013    Procedure: LAPAROSCOPIC CHOLECYSTECTOMY WITH INTRAOPERATIVE CHOLANGIOGRAM;  Surgeon: Adin Hector, MD;  Location: [facility];  Service: General;  Laterality: N/A;  . Cesarean section      3 times   Family History  Problem Relation Age of Onset  . Diabetes Mother     currently 66  . Fibromyalgia Mother   . Cirrhosis Father     died in 72  . Diabetes Maternal Grandmother   . Diabetes Maternal Aunt    History  Substance Use Topics  . Smoking status: Current Every Day Smoker -- 0.10 packs/day for 25 years    Types: Cigarettes  . Smokeless tobacco: Never Used  . Alcohol Use: No     Comment: Endorses hasn't been drinking   OB History    Gravida Para Term Preterm AB TAB SAB Ectopic Multiple Living   4 3 3  0 1 0 1  0 1 2     Review of Systems  Gastrointestinal: Positive for abdominal pain.  All other systems reviewed and are negative.     Allergies  Penicillins  Home Medications   Prior to Admission medications   Medication Sig Start Date End Date Taking? Authorizing Provider  aspirin EC 81 MG tablet Take 81 mg by mouth daily.    Historical Provider, MD  divalproex (DEPAKOTE) 250 MG DR tablet Take 250 mg by mouth 2 times daily at 12 noon and 4 pm.    Historical Provider, MD  gabapentin (NEURONTIN) 300 MG capsule Take 300 mg by mouth 3 (three) times daily.    Historical Provider, MD  insulin glargine (LANTUS) 100 UNIT/ML injection Inject 30 Units into the skin at bedtime.    Historical Provider, MD  insulin  NPH-regular Human (NOVOLIN 70/30) (70-30) 100 UNIT/ML injection Inject 8 Units into the skin 2 (two) times daily with a meal. 04/13/14   Donne Hazel, MD  medroxyPROGESTERone (DEPO-PROVERA) 150 MG/ML injection Inject 150 mg into the muscle every 3 (three) months.    Historical Provider, MD  metoCLOPramide (REGLAN) 10 MG tablet Take 1 tablet (10 mg total) by mouth 4 (four) times daily -  before meals and at bedtime. 04/13/14   Donne Hazel, MD  promethazine (PHENERGAN) 12.5 MG tablet Take 1 tablet (12.5 mg total) by mouth every 6 (six) hours as needed for nausea or vomiting. 05/10/14   Johnna Acosta, MD  risperiDONE (RISPERDAL) 2 MG tablet Take 2 mg by mouth at bedtime.    Historical Provider, MD  traMADol (ULTRAM) 50 MG tablet Take 50 mg by mouth every 8 (eight) hours as needed.    Historical Provider, MD   BP 118/52 mmHg  Pulse 95  Temp(Src) 98.4 F (36.9 C) (Oral)  Resp 16  SpO2 96% Physical Exam  Constitutional: She appears well-developed and well-nourished. She appears distressed.  HENT:  Head: Normocephalic and atraumatic.  Mouth/Throat: Oropharynx is clear and moist. No oropharyngeal exudate.  Eyes: Conjunctivae and EOM are normal. Pupils are equal, round, and reactive to light. Right eye exhibits no discharge. Left eye exhibits no discharge. No scleral icterus.  Neck: Normal range of motion. Neck supple. No JVD present. No thyromegaly present.  Cardiovascular: Regular rhythm, normal heart sounds and intact distal pulses.  Exam reveals no gallop and no friction rub.   No murmur heard. Tachycardia, weak pulses at the radial arteries  Pulmonary/Chest: Breath sounds normal. No respiratory distress. She has no wheezes. She has no rales.  Mild tachypnea  Abdominal: Soft. Bowel sounds are normal. She exhibits no distension and no mass. There is tenderness ( Mild diffuse abdominal tenderness to palpation, no focality, no guarding, no masses, no tympanitic sounds to percussion).   Musculoskeletal: Normal range of motion. She exhibits no edema or tenderness.  Lymphadenopathy:    She has no cervical adenopathy.  Neurological: She is alert. Coordination normal.  Skin: Skin is warm and dry. No rash noted. No erythema.  Psychiatric: She has a normal mood and affect. Her behavior is normal.  Nursing note and vitals reviewed.   ED Course  Procedures (including critical care time) Labs Review Labs Reviewed  CBC - Abnormal; Notable for the following:    WBC 10.7 (*)    RBC 5.78 (*)    MCV 71.3 (*)    MCH 23.2 (*)    RDW 16.0 (*)    All other components within normal limits  COMPREHENSIVE METABOLIC PANEL -  Abnormal; Notable for the following:    Glucose, Bld 395 (*)    Calcium 10.9 (*)    Total Protein 9.2 (*)    AST 93 (*)    ALT 81 (*)    Anion gap 22 (*)    All other components within normal limits  URINALYSIS, ROUTINE W REFLEX MICROSCOPIC - Abnormal; Notable for the following:    Specific Gravity, Urine 1.037 (*)    Glucose, UA >1000 (*)    Ketones, ur 40 (*)    All other components within normal limits  CBG MONITORING, ED - Abnormal; Notable for the following:    Glucose-Capillary 368 (*)    All other components within normal limits  CBG MONITORING, ED - Abnormal; Notable for the following:    Glucose-Capillary 256 (*)    All other components within normal limits  LIPASE, BLOOD  URINE MICROSCOPIC-ADD ON    Imaging Review Dg Abd Acute W/chest  05/10/2014   CLINICAL DATA:  Sharp abdominal pain for 1 day  EXAM: ACUTE ABDOMEN SERIES (ABDOMEN 2 VIEW & CHEST 1 VIEW)  COMPARISON:  04/11/2014  FINDINGS: Cardiac shadow is within normal limits. The lungs are clear bilaterally.  The the abdomen shows no free air. Scattered large and small bowel gas is noted changes of prior cholecystectomy are seen. A scoliosis of the thoracolumbar spine is noted. No acute bony abnormality is seen.  IMPRESSION: No acute abnormality noted.   Electronically Signed   By: Inez Catalina M.D.   On: 05/10/2014 20:35      MDM   Final diagnoses:  Abdominal pain  Hyperglycemia  Gastroparesis    The patient has a very dramatic appearance, she does have relapsing gastroparesis, we'll check labs to rule out diabetic ketoacidosis, doubt a acute intraperitoneal or intra-abdominal pathology but acute abdominal series ordered to rule out bowel obstruction as she has had a cholecystectomy as well as free air. Patient in agreement. Meds ordered.  Emergency Ultrasound Study:   Angiocath insertion Performed by: Johnna Acosta  Consent: Verbal consent obtained. Risks and benefits: risks, benefits and alternatives were discussed Immediately prior to procedure the correct patient, procedure, equipment, support staff and site/side marked as needed.  Indication: difficult IV access Preparation: Patient was prepped and draped in the usual sterile fashion. Vein Location: L AC vein was visualized during assessment for potential access sites and was found to be patent/ easily compressed with linear ultrasound.  The needle was visualized with real-time ultrasound and guided into the vein. Gauge: 20  Image saved and stored.  Normal blood return.  Patient tolerance: Patient tolerated the procedure well with no immediate complications.   The patient has tolerated her medications, she is now tolerating oral fluids without difficulty, is pain free and nausea free. Hyperglycemia corrected with IV fluids and insulin, no significant other abnormal lab findings.    Meds given in ED:  Medications  ondansetron (ZOFRAN) injection 4 mg (4 mg Intravenous Given 05/10/14 1930)  sodium chloride 0.9 % bolus 1,000 mL (0 mLs Intravenous Stopped 05/10/14 2134)  HYDROmorphone (DILAUDID) injection 1 mg (1 mg Intravenous Given 05/10/14 1927)  HYDROmorphone (DILAUDID) injection 1 mg (1 mg Intravenous Given 05/10/14 2044)  ondansetron (ZOFRAN) injection 4 mg (4 mg Intravenous Given 05/10/14 2044)   insulin aspart (novoLOG) injection 10 Units (10 Units Subcutaneous Given 05/10/14 2133)    New Prescriptions   PROMETHAZINE (PHENERGAN) 12.5 MG TABLET    Take 1 tablet (12.5 mg total) by mouth every 6 (six)  hours as needed for nausea or vomiting.        Johnna Acosta, MD 05/10/14 [phone]

## 2014-05-10 NOTE — ED Notes (Signed)
Bed: WA17 Expected date:  Expected time:  Means of arrival:  Comments: EMS-abdominal pain 

## 2014-05-10 NOTE — ED Notes (Signed)
Per EMS, pt from home, pt c/o generalize, sharp, stabbing, shooting abdominal pain since this morning. 10/10 pain. Pt sts that "every now and then her stomach will hurt like this and won't stop until she gets pain medication." Pt is a diabetic, pt sts she takes her medications regularly. Current CBG 364. A&Ox4. Pt has not vomited with EMS, but is spitting into emesis bag.

## 2014-05-11 ENCOUNTER — Emergency Department (HOSPITAL_COMMUNITY)
Admission: EM | Admit: 2014-05-11 | Discharge: 2014-05-11 | Disposition: A | Discharge status: Home / Self Care | Payer: Self-pay | Attending: Emergency Medicine | Admitting: Emergency Medicine

## 2014-05-11 ENCOUNTER — Encounter (HOSPITAL_COMMUNITY): Payer: Self-pay | Admitting: Emergency Medicine

## 2014-05-11 DIAGNOSIS — R630 Anorexia: Secondary | ICD-10-CM | POA: Insufficient documentation

## 2014-05-11 DIAGNOSIS — Z720 Tobacco use: Secondary | ICD-10-CM | POA: Insufficient documentation

## 2014-05-11 DIAGNOSIS — Z8719 Personal history of other diseases of the digestive system: Secondary | ICD-10-CM | POA: Insufficient documentation

## 2014-05-11 DIAGNOSIS — F4310 Post-traumatic stress disorder, unspecified: Secondary | ICD-10-CM | POA: Insufficient documentation

## 2014-05-11 DIAGNOSIS — R112 Nausea with vomiting, unspecified: Secondary | ICD-10-CM | POA: Insufficient documentation

## 2014-05-11 DIAGNOSIS — Z880 Allergy status to penicillin: Secondary | ICD-10-CM | POA: Insufficient documentation

## 2014-05-11 DIAGNOSIS — R531 Weakness: Secondary | ICD-10-CM | POA: Insufficient documentation

## 2014-05-11 DIAGNOSIS — Z9889 Other specified postprocedural states: Secondary | ICD-10-CM | POA: Insufficient documentation

## 2014-05-11 DIAGNOSIS — I10 Essential (primary) hypertension: Secondary | ICD-10-CM | POA: Insufficient documentation

## 2014-05-11 DIAGNOSIS — Z79899 Other long term (current) drug therapy: Secondary | ICD-10-CM | POA: Insufficient documentation

## 2014-05-11 DIAGNOSIS — Z87442 Personal history of urinary calculi: Secondary | ICD-10-CM | POA: Insufficient documentation

## 2014-05-11 DIAGNOSIS — Z9049 Acquired absence of other specified parts of digestive tract: Secondary | ICD-10-CM | POA: Insufficient documentation

## 2014-05-11 DIAGNOSIS — Z8619 Personal history of other infectious and parasitic diseases: Secondary | ICD-10-CM | POA: Insufficient documentation

## 2014-05-11 DIAGNOSIS — Z8669 Personal history of other diseases of the nervous system and sense organs: Secondary | ICD-10-CM | POA: Insufficient documentation

## 2014-05-11 DIAGNOSIS — Z7982 Long term (current) use of aspirin: Secondary | ICD-10-CM | POA: Insufficient documentation

## 2014-05-11 DIAGNOSIS — M1990 Unspecified osteoarthritis, unspecified site: Secondary | ICD-10-CM | POA: Insufficient documentation

## 2014-05-11 DIAGNOSIS — F319 Bipolar disorder, unspecified: Secondary | ICD-10-CM | POA: Insufficient documentation

## 2014-05-11 DIAGNOSIS — E1040 Type 1 diabetes mellitus with diabetic neuropathy, unspecified: Secondary | ICD-10-CM | POA: Insufficient documentation

## 2014-05-11 DIAGNOSIS — R1084 Generalized abdominal pain: Secondary | ICD-10-CM | POA: Insufficient documentation

## 2014-05-11 DIAGNOSIS — Z794 Long term (current) use of insulin: Secondary | ICD-10-CM | POA: Insufficient documentation

## 2014-05-11 DIAGNOSIS — Z862 Personal history of diseases of the blood and blood-forming organs and certain disorders involving the immune mechanism: Secondary | ICD-10-CM | POA: Insufficient documentation

## 2014-05-11 DIAGNOSIS — R011 Cardiac murmur, unspecified: Secondary | ICD-10-CM | POA: Insufficient documentation

## 2014-05-11 LAB — CBC WITH DIFFERENTIAL/PLATELET
Basophils Absolute: 0 10*3/uL (ref 0.0–0.1)
Basophils Relative: 0 % (ref 0–1)
Eosinophils Absolute: 0 10*3/uL (ref 0.0–0.7)
Eosinophils Relative: 0 % (ref 0–5)
HCT: 36.7 % (ref 36.0–46.0)
Hemoglobin: 11.6 g/dL — ABNORMAL LOW (ref 12.0–15.0)
Lymphocytes Relative: 25 % (ref 12–46)
Lymphs Abs: 2.9 10*3/uL (ref 0.7–4.0)
MCH: 22.7 pg — ABNORMAL LOW (ref 26.0–34.0)
MCHC: 31.6 g/dL (ref 30.0–36.0)
MCV: 71.8 fL — ABNORMAL LOW (ref 78.0–100.0)
Monocytes Absolute: 0.7 10*3/uL (ref 0.1–1.0)
Monocytes Relative: 6 % (ref 3–12)
Neutro Abs: 7.9 10*3/uL — ABNORMAL HIGH (ref 1.7–7.7)
Neutrophils Relative %: 68 % (ref 43–77)
Platelets: 144 10*3/uL — ABNORMAL LOW (ref 150–400)
RBC: 5.11 MIL/uL (ref 3.87–5.11)
RDW: 16.2 % — ABNORMAL HIGH (ref 11.5–15.5)
WBC: 11.5 10*3/uL — ABNORMAL HIGH (ref 4.0–10.5)

## 2014-05-11 LAB — COMPREHENSIVE METABOLIC PANEL
ALBUMIN: 3.5 g/dL (ref 3.5–5.2)
ALK PHOS: 81 U/L (ref 39–117)
ALT: 59 U/L — AB (ref 0–35)
AST: 63 U/L — AB (ref 0–37)
Anion gap: 16 — ABNORMAL HIGH (ref 5–15)
BUN: 20 mg/dL (ref 6–23)
CHLORIDE: 103 meq/L (ref 96–112)
CO2: 21 meq/L (ref 19–32)
Calcium: 9.3 mg/dL (ref 8.4–10.5)
Creatinine, Ser: 0.47 mg/dL — ABNORMAL LOW (ref 0.50–1.10)
GFR calc Af Amer: >90 mL/min (ref >90)
Glucose, Bld: 320 mg/dL — ABNORMAL HIGH (ref 70–99)
POTASSIUM: 4.1 meq/L (ref 3.7–5.3)
Sodium: 140 mEq/L (ref 137–147)
Total Bilirubin: 0.4 mg/dL (ref 0.3–1.2)
Total Protein: 7.1 g/dL (ref 6.0–8.3)

## 2014-05-11 LAB — BLOOD GAS, VENOUS
Acid-base deficit: 2.1 mmol/L — ABNORMAL HIGH (ref 0.0–2.0)
Bicarbonate: 20.8 mEq/L (ref 20.0–24.0)
O2 Saturation: 94.7 %
Patient temperature: 98.6
TCO2: 18.5 mmol/L (ref 0–100)
pCO2, Ven: 30.9 mmHg — ABNORMAL LOW (ref 45.0–50.0)
pH, Ven: 7.442 — ABNORMAL HIGH (ref 7.250–7.300)
pO2, Ven: 67.9 mmHg — ABNORMAL HIGH (ref 30.0–45.0)

## 2014-05-11 LAB — CBG MONITORING, ED: Glucose-Capillary: 308 mg/dL — ABNORMAL HIGH (ref 70–99)

## 2014-05-11 MED ORDER — ONDANSETRON HCL 4 MG PO TABS: 4.0000 mg | ORAL_TABLET | Freq: Four times a day (QID) | ORAL | Status: DC

## 2014-05-11 MED ORDER — SODIUM CHLORIDE 0.9 % IV BOLUS (SEPSIS)
1000.0000 mL | Freq: Once | INTRAVENOUS | Status: AC
  Administered 2014-05-11: 1000 mL via INTRAVENOUS

## 2014-05-11 MED ORDER — ONDANSETRON HCL 4 MG/2ML IJ SOLN
4.0000 mg | Freq: Once | INTRAMUSCULAR | Status: AC
  Administered 2014-05-11: 4 mg via INTRAVENOUS
  Filled 2014-05-11 (×2): qty 2

## 2014-05-11 MED ORDER — FENTANYL CITRATE 0.05 MG/ML IJ SOLN
50.0000 ug | Freq: Once | INTRAMUSCULAR | Status: AC
  Administered 2014-05-11: 50 ug via INTRAVENOUS
  Filled 2014-05-11 (×2): qty 2

## 2014-05-11 NOTE — Progress Notes (Signed)
Per RN VBG delayed due to waiting on IV team.

## 2014-05-11 NOTE — ED Notes (Signed)
IV team at bedside 

## 2014-05-11 NOTE — ED Notes (Signed)
Attempted to obtain an IV but unsuccessful.  

## 2014-05-11 NOTE — Discharge Instructions (Signed)
Be sure to continue to take your diabetes medications as prescribed. See below for further instructions.

## 2014-05-11 NOTE — Progress Notes (Addendum)
ED CM noted pt with 8 [facility] admissions, 2 ED visits in last 6 months.  CM spoke with pt about theses visits and admissions.  CM recommended pt f/u with her pcp.for maintenance care of her chronic medical issues Pt confirmed her pcp is still Dr Feliciana Rossetti of [facility].  Pt stating she was brought to ED by ems & has "no one to help her in this city"  CM discussed family, friends, and local churches.  Pt reports not trying to get assistance from any local churches  Raised her voiced stating, "They won't give me any money to buy my medicine",  " I don't know what you are talking about" , "I'm frustrated at this time.' Redirected pt & reminded pt of the walk in services, pharmacy services and her established care services with [facility] available for her chronic medical conditions.  Discussed with pt again the differences in EDP care and pcp care.  Voiced understanding that her [facility] care is to assist her with maintaining her medical issues.  Informed Cm she will go there Refused CM offer to get an appointment for her to [facility].  CM made attempt to reach [facility] staff SW & RN but not successful CM left a voice message on the nurse line (prompt 4) at [phone]

## 2014-05-11 NOTE — ED Notes (Signed)
Pt arrived to the ED with a complaint of abdominal pain.  Pt was seen yesterday and slept in the waiting area.  Pt states she woke up with the same pain and checked back in.  Pt states the pain is located lower abdomen.

## 2014-05-11 NOTE — ED Provider Notes (Signed)
CSN: [id number]     Arrival date & time 05/11/14  0606 History   First MD Initiated Contact with Patient 05/11/14 0617     Chief Complaint  Patient presents with  . Abdominal Pain     (Consider location/radiation/quality/duration/timing/severity/associated sxs/prior Treatment) HPI Pt is a 43yo female with hx of PTSD, bipolar 2 disorder, HTN, gastroparesis and type 1 diabetes mellitus with multiple episodes of DKA, 8 admissions in 6 months, presenting to ED with c/o generalized weakness with nausea and vomiting. Pt was seen last night for same. States she was discharged last night around 11:30 and given a prescription for phenergan but states she did not have a ride so she was allowed to stay in waiting area until 6AM.  States that's when her symptoms returned again. Abdominal pain is constant, diffuse, aching and cramping, 10/10, similar to previous episodes. Denies fever, diarrhea, urinary or vaginal symptoms.   Past Medical History  Diagnosis Date  . Kidney stone   . Seizure disorder     started with pregnancy of first son  . Gastritis   . Bipolar 2 disorder   . Post traumatic stress disorder     "flipping out" after people close to her died  . Heart murmur   . Arthritis     r ankle-S/P surgery (2007)  . Anemia     childhood  . Depression     childhood  . Hepatitis C     biopsy in 2010-no rx-was supposed to see a hepatologist in [place].  With cirrhosis  . Neuropathy     in legs and feet and hands  . Cataracts, bilateral   . Scoliosis   . Diabetes mellitus     diagnosed in 1996-always been on insulin  . DKA (diabetic ketoacidoses)     Recurrent admissions for DKA, medication non-complaince, poor social situation  . Diabetic neuropathy, type I diabetes mellitus     numbness bilaterally feet  . HTN (hypertension) 10/12/2013   Past Surgical History  Procedure Laterality Date  . Ankle surgery  2007  . Endometrial biopsy  06/22/2012  . Cholecystectomy  04/07/2013  .  Cholecystectomy N/A 04/07/2013    Procedure: LAPAROSCOPIC CHOLECYSTECTOMY WITH INTRAOPERATIVE CHOLANGIOGRAM;  Surgeon: Adin Hector, MD;  Location: [facility];  Service: General;  Laterality: N/A;  . Cesarean section      3 times   Family History  Problem Relation Age of Onset  . Diabetes Mother     currently 20  . Fibromyalgia Mother   . Cirrhosis Father     died in 43  . Diabetes Maternal Grandmother   . Diabetes Maternal Aunt    History  Substance Use Topics  . Smoking status: Current Every Day Smoker -- 0.10 packs/day for 25 years    Types: Cigarettes  . Smokeless tobacco: Never Used  . Alcohol Use: No     Comment: Endorses hasn't been drinking   OB History    Gravida Para Term Preterm AB TAB SAB Ectopic Multiple Living   4 3 3  0 1 0 1 0 1 2     Review of Systems  Constitutional: Positive for appetite change. Negative for fever and chills.  Respiratory: Negative for cough and shortness of breath.   Gastrointestinal: Positive for nausea, vomiting and abdominal pain. Negative for diarrhea and constipation.  Genitourinary: Negative for dysuria, frequency and flank pain.  Neurological: Positive for weakness ( generalized).  All other systems reviewed and are negative.     Allergies  Penicillins  Home Medications   Prior to Admission medications   Medication Sig Start Date End Date Taking? Authorizing Provider  aspirin EC 81 MG tablet Take 81 mg by mouth daily.    Historical Provider, MD  divalproex (DEPAKOTE) 250 MG DR tablet Take 250 mg by mouth 2 times daily at 12 noon and 4 pm.    Historical Provider, MD  gabapentin (NEURONTIN) 300 MG capsule Take 300 mg by mouth 3 (three) times daily.    Historical Provider, MD  insulin glargine (LANTUS) 100 UNIT/ML injection Inject 30 Units into the skin at bedtime.    Historical Provider, MD  insulin NPH-regular Human (NOVOLIN 70/30) (70-30) 100 UNIT/ML injection Inject 8 Units into the skin 2 (two) times daily with a meal.  04/13/14   Donne Hazel, MD  medroxyPROGESTERone (DEPO-PROVERA) 150 MG/ML injection Inject 150 mg into the muscle every 3 (three) months.    Historical Provider, MD  metoCLOPramide (REGLAN) 10 MG tablet Take 1 tablet (10 mg total) by mouth 4 (four) times daily -  before meals and at bedtime. 04/13/14   Donne Hazel, MD  ondansetron (ZOFRAN) 4 MG tablet Take 1 tablet (4 mg total) by mouth every 6 (six) hours. 05/11/14   Noland Fordyce, PA-C  promethazine (PHENERGAN) 12.5 MG tablet Take 1 tablet (12.5 mg total) by mouth every 6 (six) hours as needed for nausea or vomiting. 05/10/14   Johnna Acosta, MD  risperiDONE (RISPERDAL) 2 MG tablet Take 2 mg by mouth at bedtime.    Historical Provider, MD  traMADol (ULTRAM) 50 MG tablet Take 50 mg by mouth every 8 (eight) hours as needed.    Historical Provider, MD   BP 115/57 mmHg  Pulse 93  Temp(Src) 97.8 F (36.6 C) (Oral)  Resp 17  SpO2 99% Physical Exam  Constitutional: She appears well-developed and well-nourished. She appears distressed.  Pt lying in prone position on exam bed, vomiting  HENT:  Head: Normocephalic and atraumatic.  Eyes: Conjunctivae are normal. No scleral icterus.  Neck: Normal range of motion.  Cardiovascular: Normal rate, regular rhythm and normal heart sounds.   Pulmonary/Chest: Effort normal and breath sounds normal. No respiratory distress. She has no wheezes. She has no rales. She exhibits no tenderness.  Abdominal: Soft. Bowel sounds are normal. She exhibits no distension and no mass. There is tenderness ( generalized). There is no rebound and no guarding.  Musculoskeletal: Normal range of motion.  Neurological: She is alert.  Skin: Skin is warm and dry. She is not diaphoretic.  Nursing note and vitals reviewed.   ED Course  Procedures (including critical care time) Labs Review Labs Reviewed  CBC WITH DIFFERENTIAL - Abnormal; Notable for the following:    WBC 11.5 (*)    Hemoglobin 11.6 (*)    MCV 71.8 (*)     MCH 22.7 (*)    RDW 16.2 (*)    Platelets 144 (*)    Neutro Abs 7.9 (*)    All other components within normal limits  BLOOD GAS, VENOUS - Abnormal; Notable for the following:    pH, Ven 7.442 (*)    pCO2, Ven 30.9 (*)    pO2, Ven 67.9 (*)    Acid-base deficit 2.1 (*)    All other components within normal limits  COMPREHENSIVE METABOLIC PANEL - Abnormal; Notable for the following:    Glucose, Bld 320 (*)    Creatinine, Ser 0.47 (*)    AST 63 (*)    ALT  59 (*)    Anion gap 16 (*)    All other components within normal limits  CBG MONITORING, ED - Abnormal; Notable for the following:    Glucose-Capillary 308 (*)    All other components within normal limits    Imaging Review Dg Abd Acute W/chest  05/10/2014   CLINICAL DATA:  Sharp abdominal pain for 1 day  EXAM: ACUTE ABDOMEN SERIES (ABDOMEN 2 VIEW & CHEST 1 VIEW)  COMPARISON:  04/11/2014  FINDINGS: Cardiac shadow is within normal limits. The lungs are clear bilaterally.  The the abdomen shows no free air. Scattered large and small bowel gas is noted changes of prior cholecystectomy are seen. A scoliosis of the thoracolumbar spine is noted. No acute bony abnormality is seen.  IMPRESSION: No acute abnormality noted.   Electronically Signed   By: Inez Catalina M.D.   On: 05/10/2014 20:35     EKG Interpretation None      MDM   Final diagnoses:  Generalized abdominal pain  Non-intractable vomiting with nausea, vomiting of unspecified type    Pt is a 43yo female with hx of Type 1 DM, DKA, and gastroparesis, presenting to ED with c/o abdominal pain, nausea and vomiting. Seen for same last night, however, due to lack of ride, pt was unable to get her phenergan refilled last night.  Pt appears uncomfortable with active vomiting during exam, diffuse abdominal tenderness, however, abdomen is soft, no focal tenderness.  Vitals: mild tachycardia, likely due to recent vomiting. BP WNL  Pt given IV fluids, fentanyl, and zofran. Labs: venous  blood gas-pt is not acidotic, CBG-308, CBC-unremarkable.  9:07 AM pt resting comfortably on her left side, states she is doing fine.  CMP in process.   9:40 AM CMP-glucose: 320, anion gap: 16. Pt does not appear to be in DKA. Will attempt PO fluid challenge.   10:03 AM Pt able to keep down several ounces of PO fluids. Continues to rest comfortably in exam bed. Discussed pt with Dr. Johnney Killian who agrees pt may be discharged home to f/u with PCP as needed.      Noland Fordyce, PA-C 05/11/14 [address] PA-C 05/11/14 1205  Charlesetta Shanks, MD 05/12/14 [phone]

## 2014-05-16 ENCOUNTER — Encounter (HOSPITAL_COMMUNITY): Payer: Self-pay | Admitting: Emergency Medicine

## 2014-05-16 ENCOUNTER — Emergency Department (HOSPITAL_COMMUNITY)
Admission: EM | Admit: 2014-05-16 | Discharge: 2014-05-16 | Disposition: A | Discharge status: Home / Self Care | Payer: Self-pay | Attending: Emergency Medicine | Admitting: Emergency Medicine

## 2014-05-16 DIAGNOSIS — Z862 Personal history of diseases of the blood and blood-forming organs and certain disorders involving the immune mechanism: Secondary | ICD-10-CM | POA: Insufficient documentation

## 2014-05-16 DIAGNOSIS — E1040 Type 1 diabetes mellitus with diabetic neuropathy, unspecified: Secondary | ICD-10-CM | POA: Insufficient documentation

## 2014-05-16 DIAGNOSIS — R1084 Generalized abdominal pain: Secondary | ICD-10-CM | POA: Insufficient documentation

## 2014-05-16 DIAGNOSIS — Z8619 Personal history of other infectious and parasitic diseases: Secondary | ICD-10-CM | POA: Insufficient documentation

## 2014-05-16 DIAGNOSIS — R011 Cardiac murmur, unspecified: Secondary | ICD-10-CM | POA: Insufficient documentation

## 2014-05-16 DIAGNOSIS — I10 Essential (primary) hypertension: Secondary | ICD-10-CM | POA: Insufficient documentation

## 2014-05-16 DIAGNOSIS — Z794 Long term (current) use of insulin: Secondary | ICD-10-CM | POA: Insufficient documentation

## 2014-05-16 DIAGNOSIS — Z79899 Other long term (current) drug therapy: Secondary | ICD-10-CM | POA: Insufficient documentation

## 2014-05-16 DIAGNOSIS — Z8669 Personal history of other diseases of the nervous system and sense organs: Secondary | ICD-10-CM | POA: Insufficient documentation

## 2014-05-16 DIAGNOSIS — R112 Nausea with vomiting, unspecified: Secondary | ICD-10-CM | POA: Insufficient documentation

## 2014-05-16 DIAGNOSIS — Z720 Tobacco use: Secondary | ICD-10-CM | POA: Insufficient documentation

## 2014-05-16 DIAGNOSIS — Z7982 Long term (current) use of aspirin: Secondary | ICD-10-CM | POA: Insufficient documentation

## 2014-05-16 DIAGNOSIS — M1990 Unspecified osteoarthritis, unspecified site: Secondary | ICD-10-CM | POA: Insufficient documentation

## 2014-05-16 DIAGNOSIS — F3181 Bipolar II disorder: Secondary | ICD-10-CM | POA: Insufficient documentation

## 2014-05-16 DIAGNOSIS — G40909 Epilepsy, unspecified, not intractable, without status epilepticus: Secondary | ICD-10-CM | POA: Insufficient documentation

## 2014-05-16 DIAGNOSIS — Z87442 Personal history of urinary calculi: Secondary | ICD-10-CM | POA: Insufficient documentation

## 2014-05-16 LAB — URINALYSIS, ROUTINE W REFLEX MICROSCOPIC
Bilirubin Urine: NEGATIVE
Glucose, UA: >1000 mg/dL — AB
HGB URINE DIPSTICK: NEGATIVE
KETONES UR: NEGATIVE mg/dL
Leukocytes, UA: NEGATIVE
Nitrite: NEGATIVE
PROTEIN: NEGATIVE mg/dL
Specific Gravity, Urine: 1.035 — ABNORMAL HIGH (ref 1.005–1.030)
Urobilinogen, UA: 0.2 mg/dL (ref 0.0–1.0)
pH: 7 (ref 5.0–8.0)

## 2014-05-16 LAB — COMPREHENSIVE METABOLIC PANEL
ALBUMIN: 3.7 g/dL (ref 3.5–5.2)
ALT: 46 U/L — ABNORMAL HIGH (ref 0–35)
ANION GAP: 13 (ref 5–15)
AST: 37 U/L (ref 0–37)
Alkaline Phosphatase: 79 U/L (ref 39–117)
BILIRUBIN TOTAL: 0.3 mg/dL (ref 0.3–1.2)
BUN: 6 mg/dL (ref 6–23)
CALCIUM: 9.6 mg/dL (ref 8.4–10.5)
CO2: 25 mEq/L (ref 19–32)
Chloride: 98 mEq/L (ref 96–112)
Creatinine, Ser: 0.5 mg/dL (ref 0.50–1.10)
GFR calc Af Amer: >90 mL/min (ref >90)
Glucose, Bld: 288 mg/dL — ABNORMAL HIGH (ref 70–99)
Potassium: 3.8 mEq/L (ref 3.7–5.3)
Sodium: 136 mEq/L — ABNORMAL LOW (ref 137–147)
Total Protein: 7.5 g/dL (ref 6.0–8.3)

## 2014-05-16 LAB — LIPASE, BLOOD: Lipase: 23 U/L (ref 11–59)

## 2014-05-16 LAB — BLOOD GAS, VENOUS
Acid-Base Excess: 3.6 mmol/L — ABNORMAL HIGH (ref 0.0–2.0)
Bicarbonate: 28.3 mEq/L — ABNORMAL HIGH (ref 20.0–24.0)
Drawn by: 295031
FIO2: 0.21 %
O2 Saturation: 46.9 %
PATIENT TEMPERATURE: 98.6
PH VEN: 7.415 — AB (ref 7.250–7.300)
TCO2: 25.4 mmol/L (ref 0–100)
pCO2, Ven: 45.1 mmHg (ref 45.0–50.0)

## 2014-05-16 LAB — CBC
HEMATOCRIT: 36.3 % (ref 36.0–46.0)
Hemoglobin: 11.8 g/dL — ABNORMAL LOW (ref 12.0–15.0)
MCH: 23.1 pg — AB (ref 26.0–34.0)
MCHC: 32.5 g/dL (ref 30.0–36.0)
MCV: 71.2 fL — ABNORMAL LOW (ref 78.0–100.0)
PLATELETS: 146 10*3/uL — AB (ref 150–400)
RBC: 5.1 MIL/uL (ref 3.87–5.11)
RDW: 15.2 % (ref 11.5–15.5)
WBC: 10 10*3/uL (ref 4.0–10.5)

## 2014-05-16 LAB — URINE MICROSCOPIC-ADD ON

## 2014-05-16 LAB — I-STAT CG4 LACTIC ACID, ED: LACTIC ACID, VENOUS: 1.26 mmol/L (ref 0.5–2.2)

## 2014-05-16 MED ORDER — ONDANSETRON HCL 4 MG/2ML IJ SOLN
4.0000 mg | Freq: Once | INTRAMUSCULAR | Status: AC
  Administered 2014-05-16: 4 mg via INTRAVENOUS
  Filled 2014-05-16: qty 2

## 2014-05-16 MED ORDER — SODIUM CHLORIDE 0.9 % IV BOLUS (SEPSIS)
1000.0000 mL | Freq: Once | INTRAVENOUS | Status: AC
  Administered 2014-05-16: 1000 mL via INTRAVENOUS

## 2014-05-16 MED ORDER — HYDROMORPHONE HCL 1 MG/ML IJ SOLN
1.0000 mg | Freq: Once | INTRAMUSCULAR | Status: AC
  Administered 2014-05-16: 1 mg via INTRAVENOUS
  Filled 2014-05-16: qty 1

## 2014-05-16 NOTE — ED Notes (Signed)
Per ems pt from home, pt co chronic abdominal pain, N V. Hx gastroparesis. Pt alert and oriented.

## 2014-05-16 NOTE — Discharge Instructions (Signed)

## 2014-05-16 NOTE — ED Provider Notes (Signed)
CSN: [id number]     Arrival date & time 05/16/14  1352 History   First MD Initiated Contact with Patient 05/16/14 1359     Chief Complaint  Patient presents with  . Abdominal Pain     (Consider location/radiation/quality/duration/timing/severity/associated sxs/prior Treatment) Patient is a 43 y.o. female presenting with abdominal pain. The history is provided by the patient.  Abdominal Pain Pain location:  Generalized Pain quality: sharp   Pain radiates to:  Does not radiate Pain severity:  Moderate Onset quality:  Gradual Timing:  Constant Progression:  Worsening Chronicity:  Chronic Context: not recent illness and not recent travel   Relieved by:  Nothing Worsened by:  Nothing tried Associated symptoms: nausea and vomiting   Associated symptoms: no chills, no cough, no diarrhea, no fever and no shortness of breath     Past Medical History  Diagnosis Date  . Kidney stone   . Seizure disorder     started with pregnancy of first son  . Gastritis   . Bipolar 2 disorder   . Post traumatic stress disorder     "flipping out" after people close to her died  . Heart murmur   . Arthritis     r ankle-S/P surgery (2007)  . Anemia     childhood  . Depression     childhood  . Hepatitis C     biopsy in 2010-no rx-was supposed to see a hepatologist in [place].  With cirrhosis  . Neuropathy     in legs and feet and hands  . Cataracts, bilateral   . Scoliosis   . Diabetes mellitus     diagnosed in 1996-always been on insulin  . DKA (diabetic ketoacidoses)     Recurrent admissions for DKA, medication non-complaince, poor social situation  . Diabetic neuropathy, type I diabetes mellitus     numbness bilaterally feet  . HTN (hypertension) 10/12/2013   Past Surgical History  Procedure Laterality Date  . Ankle surgery  2007  . Endometrial biopsy  06/22/2012  . Cholecystectomy  04/07/2013  . Cholecystectomy N/A 04/07/2013    Procedure: LAPAROSCOPIC CHOLECYSTECTOMY WITH  INTRAOPERATIVE CHOLANGIOGRAM;  Surgeon: Adin Hector, MD;  Location: [facility];  Service: General;  Laterality: N/A;  . Cesarean section      3 times   Family History  Problem Relation Age of Onset  . Diabetes Mother     currently 16  . Fibromyalgia Mother   . Cirrhosis Father     died in 71  . Diabetes Maternal Grandmother   . Diabetes Maternal Aunt    History  Substance Use Topics  . Smoking status: Current Every Day Smoker -- 0.10 packs/day for 25 years    Types: Cigarettes  . Smokeless tobacco: Never Used  . Alcohol Use: No     Comment: Endorses hasn't been drinking   OB History    Gravida Para Term Preterm AB TAB SAB Ectopic Multiple Living   4 3 3  0 1 0 1 0 1 2     Review of Systems  Constitutional: Negative for fever and chills.  Respiratory: Negative for cough and shortness of breath.   Gastrointestinal: Positive for nausea, vomiting and abdominal pain. Negative for diarrhea.  All other systems reviewed and are negative.     Allergies  Penicillins  Home Medications   Prior to Admission medications   Medication Sig Start Date End Date Taking? Authorizing Provider  aspirin EC 81 MG tablet Take 81 mg by mouth daily.  Historical Provider, MD  divalproex (DEPAKOTE) 250 MG DR tablet Take 250 mg by mouth 2 times daily at 12 noon and 4 pm.    Historical Provider, MD  gabapentin (NEURONTIN) 300 MG capsule Take 300 mg by mouth 3 (three) times daily.    Historical Provider, MD  insulin glargine (LANTUS) 100 UNIT/ML injection Inject 30 Units into the skin at bedtime.    Historical Provider, MD  insulin NPH-regular Human (NOVOLIN 70/30) (70-30) 100 UNIT/ML injection Inject 8 Units into the skin 2 (two) times daily with a meal. 04/13/14   Donne Hazel, MD  medroxyPROGESTERone (DEPO-PROVERA) 150 MG/ML injection Inject 150 mg into the muscle every 3 (three) months.    Historical Provider, MD  metoCLOPramide (REGLAN) 10 MG tablet Take 1 tablet (10 mg total) by mouth 4  (four) times daily -  before meals and at bedtime. 04/13/14   Donne Hazel, MD  ondansetron (ZOFRAN) 4 MG tablet Take 1 tablet (4 mg total) by mouth every 6 (six) hours. 05/11/14   Noland Fordyce, PA-C  promethazine (PHENERGAN) 12.5 MG tablet Take 1 tablet (12.5 mg total) by mouth every 6 (six) hours as needed for nausea or vomiting. 05/10/14   Johnna Acosta, MD  risperiDONE (RISPERDAL) 2 MG tablet Take 2 mg by mouth at bedtime.    Historical Provider, MD  traMADol (ULTRAM) 50 MG tablet Take 50 mg by mouth every 8 (eight) hours as needed.    Historical Provider, MD   BP 129/93 mmHg  Pulse 71  Temp(Src) 98.8 F (37.1 C) (Oral)  Resp 20  SpO2 100% Physical Exam  Constitutional: She is oriented to person, place, and time. She appears well-developed and well-nourished. No distress.  HENT:  Head: Normocephalic and atraumatic.  Mouth/Throat: Oropharynx is clear and moist. No oropharyngeal exudate.  Eyes: EOM are normal. Pupils are equal, round, and reactive to light.  Neck: Normal range of motion. Neck supple.  Cardiovascular: Normal rate and regular rhythm.  Exam reveals no friction rub.   No murmur heard. Pulmonary/Chest: Effort normal and breath sounds normal. No respiratory distress. She has no wheezes. She has no rales.  Abdominal: Soft. She exhibits no distension. There is tenderness (mild, diffuse). There is no rebound.  Musculoskeletal: Normal range of motion. She exhibits no edema.  Neurological: She is alert and oriented to person, place, and time. No cranial nerve deficit. She exhibits normal muscle tone. Coordination normal.  Skin: No rash noted. She is not diaphoretic.  Nursing note and vitals reviewed.   ED Course  Procedures (including critical care time) Labs Review Labs Reviewed  CBC  COMPREHENSIVE METABOLIC PANEL  LIPASE, BLOOD  BLOOD GAS, VENOUS  URINALYSIS, ROUTINE W REFLEX MICROSCOPIC  I-STAT CG4 LACTIC ACID, ED    Imaging Review No results found.   EKG  Interpretation None      MDM   Final diagnoses:  Generalized abdominal pain  Non-intractable vomiting with nausea, vomiting of unspecified type    87F with hx of Type 1 DM, DKA presents with 1 week of abdominal pain, nausea, vomiting. Had break from vomiting yesterday, but continued today. No missed doses of insulin. No fevers, CP, SOB. Has diffuse abdominal pain, no guarding or rebound.  Will check labs, start fluids, give some pain medicines. Concern for DKA. Labs ok. Feeling better after meds. I feel this likely an exacerbation of her chronic pain. Stable for discharge.    Evelina Bucy, MD 05/16/14 [phone]

## 2014-05-18 ENCOUNTER — Encounter: Payer: Self-pay | Admitting: *Deleted

## 2014-05-18 ENCOUNTER — Encounter: Payer: Self-pay | Attending: Internal Medicine | Admitting: *Deleted

## 2014-05-18 VITALS — Ht 64.0 in | Wt 124.6 lb

## 2014-05-18 DIAGNOSIS — Z794 Long term (current) use of insulin: Secondary | ICD-10-CM | POA: Insufficient documentation

## 2014-05-18 DIAGNOSIS — IMO0002 Reserved for concepts with insufficient information to code with codable children: Secondary | ICD-10-CM

## 2014-05-18 DIAGNOSIS — E1065 Type 1 diabetes mellitus with hyperglycemia: Secondary | ICD-10-CM | POA: Insufficient documentation

## 2014-05-18 DIAGNOSIS — Z713 Dietary counseling and surveillance: Secondary | ICD-10-CM | POA: Insufficient documentation

## 2014-05-18 NOTE — Patient Instructions (Signed)
Plan:  Aim for 3 Carb Choices per meal (45 grams) +/- 1 either way  Aim for 0-2 Carbs per snack if hungry  Include protein in moderation with your meals and snacks Consider reading food labels for Total Carbohydrate of foods Continue with your activity level by walking daily as tolerated Continue checking BG at alternate times per day as directed by MD  Continue taking medication insulin as directed by MD

## 2014-05-18 NOTE — Progress Notes (Signed)
Diabetes Self-Management Education  Visit Type:  Initial Visit  Appt. Start Time: 1000 Appt. End Time: 1130  05/18/2014  [name], identified by name and date of birth, is a 43 y.o. female with a diagnosis of Diabetes: Type 1.  Other people present during visit:  Patient   ASSESSMENT  Height 5\' 4"  (1.626 m), weight 124 lb 9.6 oz (56.518 kg). Body mass index is 21.38 kg/(m^2).  Initial Visit Information:  Are you currently following a meal plan?: No   Are you taking your medications as prescribed?: No (is trying harder to take as prescribed, sometimes forgets) Are you checking your feet?: Yes How many days per week are you checking your feet?: 7 How often do you need to have someone help you when you read instructions, pamphlets, or other written materials from your doctor or pharmacy?: 1 - Never    Psychosocial:     Self-management support: [place] office, Family, Church (her mom and her god daughter assist with care) Other persons present: Patient Patient Concerns: Nutrition/Meal planning Preferred Learning Style: Visual Learning Readiness: Ready  Complications:   Last HgB A1C per patient/outside source: 13.2 mg/dL How often do you check your blood sugar?: 3-4 times/day Fasting Blood glucose range (mg/dL): >200 Postprandial Blood glucose range (mg/dL): >200 Number of hypoglycemic episodes per month: 0 Have you had a dilated eye exam in the past 12 months?: Yes Have you had a dental exam in the past 12 months?: No  Diet Intake:  Breakfast: skips Lunch: varies, hot dogs OR what ever is in the house Dinner: lean meat, noodles rice or potato, vegetables, occasionally salad, coffee or water Snack (evening): chips OR crackers with cheese Beverage(s): coffee, water, diet soda, fruit juice  Exercise:  Exercise: Light (walking / raking leaves) (walks to store and bus stop daily) Light Exercise amount of time (min / week): 150  Individualized Plan for Diabetes  Self-Management Training:   Learning Objective:  Patient will have a greater understanding of diabetes self-management.  Patient education plan per assessed needs and concerns is to attend individual sessions for     Education Topics Reviewed with Patient Today:  Explored patient's options for treatment of their diabetes Role of diet in the treatment of diabetes and the relationship between the three main macronutrients and blood glucose level, Food label reading, portion sizes and measuring food., Carbohydrate counting, Meal timing in regards to the patients' current diabetes medication.   Reviewed patients medication for diabetes, action, purpose, timing of dose and side effects.     Identified and discussed with patient  current chronic complications (gastroparesis and it's affect on the digestion of food) Helped patient identify a support system for diabetes management   Other (comment) (provided DM 1 Support Group Flyer)  PATIENTS GOALS/Plan (Developed by the patient):  Nutrition: Follow meal plan discussed Physical Activity: 30 minutes per day Medications: take my medication as prescribed (Explained insulin action of Novollin 70/30 and Lantus. Encouraged patient to always take her Lantus to help avoid DKA) Monitoring : test blood glucose pre and post meals as discussed  Plan:   Patient Instructions  Plan:  Aim for 3 Carb Choices per meal (45 grams) +/- 1 either way  Aim for 0-2 Carbs per snack if hungry  Include protein in moderation with your meals and snacks Consider reading food labels for Total Carbohydrate of foods Continue with your activity level by walking daily as tolerated Continue checking BG at alternate times per day as directed by  MD  Continue taking medication insulin as directed by MD       Expected Outcomes:  Demonstrated interest in learning. Expect positive outcomes (She was very excited to have Carb content of foods information so she can start   spreading them more evenly throughout the day.)  Education material provided: Living Well with Diabetes, A1C conversion sheet, Meal plan card, DM 1 Support group flyer and Carbohydrate counting sheet, Insulin Action and Menu Planner sheets  If problems or questions, patient to contact team via:  Phone and Email  Future DSME appointment: 4-6 wks

## 2014-05-20 ENCOUNTER — Ambulatory Visit: Payer: Medicaid Other | Attending: Internal Medicine | Admitting: Internal Medicine

## 2014-05-20 ENCOUNTER — Telehealth: Payer: Self-pay

## 2014-05-20 VITALS — BP 114/77 | HR 99 | Temp 98.3°F | Resp 20 | Ht 64.0 in | Wt 131.0 lb

## 2014-05-20 DIAGNOSIS — E1065 Type 1 diabetes mellitus with hyperglycemia: Secondary | ICD-10-CM

## 2014-05-20 DIAGNOSIS — R739 Hyperglycemia, unspecified: Secondary | ICD-10-CM | POA: Diagnosis not present

## 2014-05-20 DIAGNOSIS — IMO0002 Reserved for concepts with insufficient information to code with codable children: Secondary | ICD-10-CM

## 2014-05-20 LAB — POCT URINALYSIS DIPSTICK
BILIRUBIN UA: NEGATIVE
GLUCOSE UA: 500
Ketones, UA: NEGATIVE
LEUKOCYTES UA: NEGATIVE
NITRITE UA: NEGATIVE
Protein, UA: NEGATIVE
RBC UA: NEGATIVE
Spec Grav, UA: 1.01
Urobilinogen, UA: 0.2
pH, UA: 6.5

## 2014-05-20 LAB — GLUCOSE, POCT (MANUAL RESULT ENTRY)
POC GLUCOSE: 464 mg/dL — AB (ref 70–99)
POC GLUCOSE: 324 mg/dL — AB (ref 70–99)

## 2014-05-20 MED ORDER — SODIUM CHLORIDE 0.9 % IV SOLN: 1000.0000 mL | Freq: Once | INTRAVENOUS | Status: DC

## 2014-05-20 MED ORDER — INSULIN ASPART 100 UNIT/ML ~~LOC~~ SOLN: 20.0000 [IU] | Freq: Once | SUBCUTANEOUS | Status: DC

## 2014-05-20 MED ORDER — INSULIN ASPART 100 UNIT/ML ~~LOC~~ SOLN
20.0000 [IU] | Freq: Once | SUBCUTANEOUS | Status: AC
  Administered 2014-05-20: 20 [IU] via SUBCUTANEOUS

## 2014-05-20 MED ORDER — SODIUM CHLORIDE 0.9 % IV SOLN
Freq: Once | INTRAVENOUS | Status: AC
  Administered 2014-05-20: 500 mL via INTRAVENOUS

## 2014-05-20 NOTE — Progress Notes (Signed)
Patient called today for follow-up, and patient indicated her blood glucose was 566. This RN informed Dr. Doreene Burke who indicated patient should walk in to clinic. Patient instructed to walk-in for evaluation.  Patient did not arrive until 92.  Called again at 1430 and instructed to come to clinic. Patient now indicates she checked her blood glucose last at 1400 and it was 521 not 421 as she previously said. She administered NPH and ate lunch at that time. Blood glucose checked at clinic, and it is now 464.  Urine dipstick ordered. Urine negative for ketones. Spoke with Dr. Doreene Burke who gave a verbal order for 1L of NS as well as well as for 20U of Novolog. Orders entered. 20U of Novolog administered subcutaneously in left arm.

## 2014-05-20 NOTE — Telephone Encounter (Signed)
Placed call to patient for follow-up as patient has been to ED twice since her last office visit on 04/26/14.  Patient in ED on 05/10/14 and 05/16/14 for abdominal pain.  Patient indicates she is now feeling "pretty good."  She indicates she is still having some abdominal pain-unable to rate, but not having nausea any longer. Taking medications as prescribed except not taking antiemetics. Discussed compliance with diabetes management. Patient indicates she checked her blood glucose this morning, and her blood glucose was 566. She indicates she gave herself 30U of Lantus as prescribed, and she has not rechecked her blood glucose.  She is unable to do so at this time as she is in the car.  She indicates her blood glucose has been "running high for last few days."  Spoke with Dr. Doreene Burke who indicated patient should walk-in to [facility] for a nurse visit to check blood glucose and urine for ketones.  Patient informed to get to clinic as soon as possible due to hyperglycemia.  Patient verbalized understanding. Triage nurse also updated.

## 2014-05-20 NOTE — Telephone Encounter (Signed)
Placed call to patient once again as she has not yet walked-in to [facility] as directed by Dr. Doreene Burke due to hyperglycemia.  Patient indicates her blood glucose has "come down some" and was "around 421" when she checked it last before lunch. She indicates she ate lunch and gave herself her insulin NPH as prescribed. Patient administered Lantus this morning. Informed patient that she needs to walk-in to clinic as soon as possible due to hyperglycemia-blood glucose needs to be checked and urine needs to be checked for ketones.  Patient aware of clinic hours and informed if she does not walk in to clinic she will need to go to the ED as she is at risk for DKA.  Patient verbalized understanding.

## 2014-05-20 NOTE — Telephone Encounter (Signed)
0930 Addendum-Unable to determine from patient if she ate or administered insulin NPH

## 2014-05-20 NOTE — Progress Notes (Signed)
Repeat CBG 324 (one hour after insulin administration and after 300 mL IV NS infused) IV d/c per provider Patient given appt with PCP for 05/24/14 Patient given bus pass to get home Patient discharged to home in stable condition

## 2014-05-24 ENCOUNTER — Ambulatory Visit: Payer: Self-pay | Admitting: Internal Medicine

## 2014-05-25 ENCOUNTER — Encounter (HOSPITAL_COMMUNITY): Payer: Self-pay | Admitting: Emergency Medicine

## 2014-05-25 ENCOUNTER — Emergency Department (HOSPITAL_COMMUNITY)
Admission: EM | Admit: 2014-05-25 | Discharge: 2014-05-26 | Disposition: A | Discharge status: Home / Self Care | Payer: Self-pay | Attending: Emergency Medicine | Admitting: Emergency Medicine

## 2014-05-25 DIAGNOSIS — R569 Unspecified convulsions: Secondary | ICD-10-CM | POA: Insufficient documentation

## 2014-05-25 DIAGNOSIS — Z720 Tobacco use: Secondary | ICD-10-CM | POA: Insufficient documentation

## 2014-05-25 DIAGNOSIS — E1143 Type 2 diabetes mellitus with diabetic autonomic (poly)neuropathy: Secondary | ICD-10-CM | POA: Insufficient documentation

## 2014-05-25 DIAGNOSIS — R011 Cardiac murmur, unspecified: Secondary | ICD-10-CM | POA: Insufficient documentation

## 2014-05-25 DIAGNOSIS — Z794 Long term (current) use of insulin: Secondary | ICD-10-CM | POA: Insufficient documentation

## 2014-05-25 DIAGNOSIS — M19071 Primary osteoarthritis, right ankle and foot: Secondary | ICD-10-CM | POA: Insufficient documentation

## 2014-05-25 DIAGNOSIS — Z8619 Personal history of other infectious and parasitic diseases: Secondary | ICD-10-CM | POA: Insufficient documentation

## 2014-05-25 DIAGNOSIS — K3184 Gastroparesis: Secondary | ICD-10-CM | POA: Insufficient documentation

## 2014-05-25 DIAGNOSIS — I10 Essential (primary) hypertension: Secondary | ICD-10-CM | POA: Insufficient documentation

## 2014-05-25 DIAGNOSIS — Z7982 Long term (current) use of aspirin: Secondary | ICD-10-CM | POA: Insufficient documentation

## 2014-05-25 DIAGNOSIS — Z87442 Personal history of urinary calculi: Secondary | ICD-10-CM | POA: Insufficient documentation

## 2014-05-25 DIAGNOSIS — Z880 Allergy status to penicillin: Secondary | ICD-10-CM | POA: Insufficient documentation

## 2014-05-25 DIAGNOSIS — Z862 Personal history of diseases of the blood and blood-forming organs and certain disorders involving the immune mechanism: Secondary | ICD-10-CM | POA: Insufficient documentation

## 2014-05-25 LAB — CBC WITH DIFFERENTIAL/PLATELET
BASOS ABS: 0 10*3/uL (ref 0.0–0.1)
Basophils Relative: 0 % (ref 0–1)
EOS PCT: 0 % (ref 0–5)
Eosinophils Absolute: 0 10*3/uL (ref 0.0–0.7)
HCT: 39.2 % (ref 36.0–46.0)
Hemoglobin: 12.4 g/dL (ref 12.0–15.0)
LYMPHS PCT: 16 % (ref 12–46)
Lymphs Abs: 1.7 10*3/uL (ref 0.7–4.0)
MCH: 22.8 pg — ABNORMAL LOW (ref 26.0–34.0)
MCHC: 31.6 g/dL (ref 30.0–36.0)
MCV: 71.9 fL — AB (ref 78.0–100.0)
Monocytes Absolute: 0.1 10*3/uL (ref 0.1–1.0)
Monocytes Relative: 1 % — ABNORMAL LOW (ref 3–12)
NEUTROS PCT: 83 % — AB (ref 43–77)
Neutro Abs: 8.6 10*3/uL — ABNORMAL HIGH (ref 1.7–7.7)
Platelets: 137 10*3/uL — ABNORMAL LOW (ref 150–400)
RBC: 5.45 MIL/uL — AB (ref 3.87–5.11)
RDW: 15.5 % (ref 11.5–15.5)
WBC: 10.4 10*3/uL (ref 4.0–10.5)

## 2014-05-25 LAB — COMPREHENSIVE METABOLIC PANEL
ALK PHOS: 90 U/L (ref 39–117)
ALT: 46 U/L — AB (ref 0–35)
AST: 57 U/L — AB (ref 0–37)
Albumin: 3.8 g/dL (ref 3.5–5.2)
Anion gap: 17 — ABNORMAL HIGH (ref 5–15)
BUN: 14 mg/dL (ref 6–23)
CALCIUM: 10.2 mg/dL (ref 8.4–10.5)
CO2: 22 mEq/L (ref 19–32)
Chloride: 93 mEq/L — ABNORMAL LOW (ref 96–112)
Creatinine, Ser: 0.48 mg/dL — ABNORMAL LOW (ref 0.50–1.10)
GFR calc Af Amer: >90 mL/min (ref >90)
GFR calc non Af Amer: >90 mL/min (ref >90)
Glucose, Bld: 287 mg/dL — ABNORMAL HIGH (ref 70–99)
POTASSIUM: 4.9 meq/L (ref 3.7–5.3)
Sodium: 132 mEq/L — ABNORMAL LOW (ref 137–147)
TOTAL PROTEIN: 8.1 g/dL (ref 6.0–8.3)
Total Bilirubin: 0.3 mg/dL (ref 0.3–1.2)

## 2014-05-25 LAB — URINALYSIS, ROUTINE W REFLEX MICROSCOPIC
Bilirubin Urine: NEGATIVE
Hgb urine dipstick: NEGATIVE
Ketones, ur: 40 mg/dL — AB
LEUKOCYTES UA: NEGATIVE
Nitrite: NEGATIVE
PH: 6.5 (ref 5.0–8.0)
Protein, ur: NEGATIVE mg/dL
Specific Gravity, Urine: 1.039 — ABNORMAL HIGH (ref 1.005–1.030)
Urobilinogen, UA: 0.2 mg/dL (ref 0.0–1.0)

## 2014-05-25 LAB — CBG MONITORING, ED: Glucose-Capillary: 183 mg/dL — ABNORMAL HIGH (ref 70–99)

## 2014-05-25 LAB — LIPASE, BLOOD: Lipase: 22 U/L (ref 11–59)

## 2014-05-25 LAB — URINE MICROSCOPIC-ADD ON

## 2014-05-25 MED ORDER — METOCLOPRAMIDE HCL 5 MG/ML IJ SOLN
10.0000 mg | Freq: Once | INTRAMUSCULAR | Status: AC
Start: 2014-05-25 — End: 2014-05-25
  Administered 2014-05-25: 10 mg via INTRAVENOUS
  Filled 2014-05-25: qty 2

## 2014-05-25 MED ORDER — INSULIN ASPART 100 UNIT/ML ~~LOC~~ SOLN
8.0000 [IU] | Freq: Once | SUBCUTANEOUS | Status: AC
  Administered 2014-05-25: 8 [IU] via SUBCUTANEOUS
  Filled 2014-05-25: qty 1

## 2014-05-25 MED ORDER — SODIUM CHLORIDE 0.9 % IV BOLUS (SEPSIS)
1000.0000 mL | Freq: Once | INTRAVENOUS | Status: AC
  Administered 2014-05-25: 1000 mL via INTRAVENOUS

## 2014-05-25 MED ORDER — ONDANSETRON HCL 4 MG/2ML IJ SOLN
4.0000 mg | Freq: Once | INTRAMUSCULAR | Status: AC
  Administered 2014-05-25: 4 mg via INTRAVENOUS
  Filled 2014-05-25: qty 2

## 2014-05-25 MED ORDER — LORAZEPAM 2 MG/ML IJ SOLN
1.0000 mg | Freq: Once | INTRAMUSCULAR | Status: AC
  Administered 2014-05-25: 1 mg via INTRAVENOUS
  Filled 2014-05-25: qty 1

## 2014-05-25 NOTE — ED Notes (Signed)
x2 attempt at an IV without success. Another RN to attempt at this time

## 2014-05-25 NOTE — ED Notes (Signed)
Patient yelling out, asking for nurse, notified patient I would send nurse in as soon as they could get to her.

## 2014-05-25 NOTE — ED Notes (Signed)
Pt is a hard stick. Will get labs when pt gets to room in the back.

## 2014-05-25 NOTE — ED Provider Notes (Signed)
CSN: [id number]     Arrival date & time 05/25/14  1708 History   First MD Initiated Contact with Patient 05/25/14 1957     Chief Complaint  Patient presents with  . Abdominal Pain     (Consider location/radiation/quality/duration/timing/severity/associated sxs/prior Treatment) HPI   43 year old female with history of bipolar, type 1 diabetes, prior DKA who presents for evaluation of abdominal pain with nausea and vomiting. Patient reports she developed acute intense generalized abdominal pain which started today. Pain is described as a sharp achy crampy sensation, persistent, with associated nausea and has vomited more than 10 times along with having loose stools. Pain is similar to prior abdominal pain which she had on a recurrent basis. She is requesting for pain medication including Dilaudid. She has history of gastroparesis. She denies having any fever, chest pain, trouble breathing, cough, dysuria. History was difficult to obtain as patient is uncooperative. I have review patient's prior visits and patient has been seen in the ER multiple times for similar complaint and has been admitted several times in the past as well.     Past Medical History  Diagnosis Date  . Kidney stone   . Seizure disorder     started with pregnancy of first son  . Gastritis   . Bipolar 2 disorder   . Post traumatic stress disorder     "flipping out" after people close to her died  . Heart murmur   . Arthritis     r ankle-S/P surgery (2007)  . Anemia     childhood  . Depression     childhood  . Hepatitis C     biopsy in 2010-no rx-was supposed to see a hepatologist in [place].  With cirrhosis  . Neuropathy     in legs and feet and hands  . Cataracts, bilateral   . Scoliosis   . Diabetes mellitus     diagnosed in 1996-always been on insulin  . DKA (diabetic ketoacidoses)     Recurrent admissions for DKA, medication non-complaince, poor social situation  . Diabetic neuropathy, type I  diabetes mellitus     numbness bilaterally feet  . HTN (hypertension) 10/12/2013   Past Surgical History  Procedure Laterality Date  . Ankle surgery  2007  . Endometrial biopsy  06/22/2012  . Cholecystectomy  04/07/2013  . Cholecystectomy N/A 04/07/2013    Procedure: LAPAROSCOPIC CHOLECYSTECTOMY WITH INTRAOPERATIVE CHOLANGIOGRAM;  Surgeon: Adin Hector, MD;  Location: [facility];  Service: General;  Laterality: N/A;  . Cesarean section      3 times   Family History  Problem Relation Age of Onset  . Diabetes Mother     currently 21  . Fibromyalgia Mother   . Cirrhosis Father     died in 85  . Diabetes Maternal Grandmother   . Diabetes Maternal Aunt    History  Substance Use Topics  . Smoking status: Current Every Day Smoker -- 0.10 packs/day for 25 years    Types: Cigarettes  . Smokeless tobacco: Never Used  . Alcohol Use: No     Comment: Endorses hasn't been drinking   OB History    Gravida Para Term Preterm AB TAB SAB Ectopic Multiple Living   4 3 3  0 1 0 1 0 1 2     Review of Systems  All other systems reviewed and are negative.     Allergies  Penicillins  Home Medications   Prior to Admission medications   Medication Sig Start Date End Date  Taking? Authorizing Provider  aspirin EC 81 MG tablet Take 81 mg by mouth daily.    Historical Provider, MD  divalproex (DEPAKOTE) 250 MG DR tablet Take 250 mg by mouth 2 times daily at 12 noon and 4 pm.    Historical Provider, MD  gabapentin (NEURONTIN) 300 MG capsule Take 300 mg by mouth 3 (three) times daily.    Historical Provider, MD  insulin glargine (LANTUS) 100 UNIT/ML injection Inject 30 Units into the skin every morning.     Historical Provider, MD  insulin NPH-regular Human (NOVOLIN 70/30) (70-30) 100 UNIT/ML injection Inject 8 Units into the skin 2 (two) times daily with a meal. 04/13/14   Donne Hazel, MD  medroxyPROGESTERone (DEPO-PROVERA) 150 MG/ML injection Inject 150 mg into the muscle every 3 (three)  months.    Historical Provider, MD  metoCLOPramide (REGLAN) 10 MG tablet Take 1 tablet (10 mg total) by mouth 4 (four) times daily -  before meals and at bedtime. 04/13/14   Donne Hazel, MD  ondansetron (ZOFRAN) 4 MG tablet Take 1 tablet (4 mg total) by mouth every 6 (six) hours. Patient not taking: Reported on 05/20/2014 05/11/14   Noland Fordyce, PA-C  promethazine (PHENERGAN) 12.5 MG tablet Take 1 tablet (12.5 mg total) by mouth every 6 (six) hours as needed for nausea or vomiting. Patient not taking: Reported on 05/20/2014 05/10/14   Johnna Acosta, MD  risperiDONE (RISPERDAL) 2 MG tablet Take 2 mg by mouth at bedtime.    Historical Provider, MD  traMADol (ULTRAM) 50 MG tablet Take 50 mg by mouth every 8 (eight) hours as needed.    Historical Provider, MD   BP 123/80 mmHg  Pulse 72  Temp(Src) 98.5 F (36.9 C) (Oral)  Resp 20  SpO2 100% Physical Exam  Constitutional: She is oriented to person, place, and time. She appears well-developed and well-nourished. No distress.  HENT:  Head: Atraumatic.  Eyes: Conjunctivae are normal.  Neck: Neck supple.  Cardiovascular: Normal rate.   Pulmonary/Chest: Effort normal and breath sounds normal. No respiratory distress. She exhibits no tenderness.  Abdominal: Soft. Bowel sounds are normal. She exhibits no distension. There is tenderness (Generalized abdominal tenderness without focal point tenderness. No peritoneal sign.).  Neurological: She is alert and oriented to person, place, and time.  Skin: No rash noted.  Psychiatric: She has a normal mood and affect.  Nursing note and vitals reviewed.   ED Course  Procedures (including critical care time)  8:34 PM Patient here with acute on chronic abdominal pain. History of gastroparesis. She is afebrile with stable normal vital sign. She has a nonsurgical abdomen however patient rolling around in bed, crying, and appears upset. Workup initiated.  9:47 PM Patient's labs are at baseline for her as  compared to prior values. Normal WBC. Bicarbonate is 22. Glucose of 287 which is at her baseline. Mild anion gap of 17 with some ketones which is once again at her baseline. I have reviewed her prior charts, patient has been seen multiple times for this and has had 36 different imaging within the past few years. Her most recent imaging for the last several months shows no acute abdominal findings or any other emergent finding.  Care discussed with Dr. Johnney Killian.  Pt currently received IVF, ativan, benadryl and reglan for her sxs.  Does not think narcotic medication for chronic abdominal pain is appropriate.    12:03 AM Pt has received insulin, and CBG improves to 183.  Pain has improved.  Pt able to tolerates PO without nausea.  UA without evidence of UTI.    12:28 AM Patient is sleeping soundly and appears to be in no discomfort. On recheck of her vital sign, a blood pressure is 102/67 however she is tachycardic with a heart rate of 119. No fever. Will continue with IV fluid and close monitoring. My attending doctor is aware.    1:20 AM Vital signs improves.  Pt stable for discharged.  Return precaution given.    Labs Review Labs Reviewed  CBC WITH DIFFERENTIAL - Abnormal; Notable for the following:    RBC 5.45 (*)    MCV 71.9 (*)    MCH 22.8 (*)    Platelets 137 (*)    Neutrophils Relative % 83 (*)    Neutro Abs 8.6 (*)    Monocytes Relative 1 (*)    All other components within normal limits  COMPREHENSIVE METABOLIC PANEL - Abnormal; Notable for the following:    Sodium 132 (*)    Chloride 93 (*)    Glucose, Bld 287 (*)    Creatinine, Ser 0.48 (*)    AST 57 (*)    ALT 46 (*)    Anion gap 17 (*)    All other components within normal limits  LIPASE, BLOOD  URINALYSIS, ROUTINE W REFLEX MICROSCOPIC    Imaging Review No results found.   EKG Interpretation None      MDM   Final diagnoses:  Gastroparesis due to DM    BP 120/69 mmHg  Pulse 103  Temp(Src) 98.2 F (36.8 C)  (Oral)  Resp 16  SpO2 96%      Domenic Moras, PA-C 05/26/14 0121  Charlesetta Shanks, MD 05/26/14 1734

## 2014-05-25 NOTE — ED Notes (Signed)
Pt here via [facility] c/o abdominal pain adn emesis. HX of gastroparesis. Reports vomiting x 10 times. 50 mcg of intranasal fentanyl en route.

## 2014-05-25 NOTE — ED Notes (Signed)
Pt drank ginger ale without nausea.

## 2014-05-26 MED ORDER — SODIUM CHLORIDE 0.9 % IV BOLUS (SEPSIS): 1000.0000 mL | Freq: Once | INTRAVENOUS | Status: DC

## 2014-05-26 MED ORDER — ONDANSETRON HCL 4 MG PO TABS
4.0000 mg | ORAL_TABLET | Freq: Four times a day (QID) | ORAL | Status: DC
Start: 2014-05-26 — End: 2014-06-08

## 2014-05-26 NOTE — Discharge Instructions (Signed)
Your symptoms are likely due to uncontrolled diabetes.  Please take your insulin and medication as prescribed.  Take antinausea medication as needed.  Follow up closely with your doctor for further management of your health.  Return if your condition worsen or if you have other concerns.    Gastroparesis  Gastroparesis is also called slowed stomach emptying (delayed gastric emptying). It is a condition in which the stomach takes too long to empty its contents. It often happens in people with diabetes.  CAUSES  Gastroparesis happens when nerves to the stomach are damaged or stop working. When the nerves are damaged, the muscles of the stomach and intestines do not work normally. The movement of food is slowed or stopped. High blood glucose (sugar) causes changes in nerves and can damage the blood vessels that carry oxygen and nutrients to the nerves. RISK FACTORS  Diabetes.  Post-viral syndromes.  Eating disorders (anorexia, bulimia).  Surgery on the stomach or vagus nerve.  Gastroesophageal reflux disease (rarely).  Smooth muscle disorders (amyloidosis, scleroderma).  Metabolic disorders, including hypothyroidism.  Parkinson disease. SYMPTOMS   Heartburn.  Feeling sick to your stomach (nausea).  Vomiting of undigested food.  An early feeling of fullness when eating.  Weight loss.  Abdominal bloating.  Erratic blood glucose levels.  Lack of appetite.  Gastroesophageal reflux.  Spasms of the stomach wall. Complications can include:  Bacterial overgrowth in stomach. Food stays in the stomach and can ferment and cause bacteria to grow.  Weight loss due to difficulty digesting and absorbing nutrients.  Vomiting.  Obstruction in the stomach. Undigested food can harden and cause nausea and vomiting.  Blood glucose fluctuations caused by inconsistent food absorption. DIAGNOSIS  The diagnosis of gastroparesis is confirmed through one or more of the following  tests:  Barium X-rays and scans. These tests look at how long it takes for food to move through the stomach.  Gastric manometry. This test measures electrical and muscular activity in the stomach. A thin tube is passed down the throat into the stomach. The tube contains a wire that takes measurements of the stomach's electrical and muscular activity as it digests liquids and solid food.  Endoscopy. This procedure is done with a long, thin tube called an endoscope. It is passed through the mouth and gently down the esophagus into the stomach. This tube helps the caregiver look at the lining of the stomach to check for any abnormalities.  Ultrasonography. This can rule out gallbladder disease or pancreatitis. This test will outline and define the shape of the gallbladder and pancreas. TREATMENT   Treatments may include:  Exercise.  Medicines to control nausea and vomiting.  Medicines to stimulate stomach muscles.  Changes in what and when you eat.  Having smaller meals more often.  Eating low-fiber forms of high-fiber foods, such as eating cooked vegetables instead of raw vegetables.  Eating low-fat foods.  Consuming liquids, which are easier to digest.  In severe cases, feeding tubes and intravenous (IV) feeding may be needed. It is important to note that in most cases, treatment does not cure gastroparesis. It is usually a lasting (chronic) condition. Treatment helps you manage the underlying condition so that you can be as healthy and comfortable as possible. Other treatments  A gastric neurostimulator has been developed to assist people with gastroparesis. The battery-operated device is surgically implanted. It emits mild electrical pulses to help improve stomach emptying and to control nausea and vomiting.  The use of botulinum toxin has been shown  to improve stomach emptying by decreasing the prolonged contractions of the muscle between the stomach and the small intestine  (pyloric sphincter). The benefits are temporary. SEEK MEDICAL CARE IF:   You have diabetes and you are having problems keeping your blood glucose in goal range.  You are having nausea, vomiting, bloating, or early feelings of fullness with eating.  Your symptoms do not change with a change in diet. Document Released: 06/03/2005 Document Revised: 09/28/2012 Document Reviewed: 11/10/2008 [facility] Patient Information 2015 [place], [place]. This information is not intended to replace advice given to you by your health care provider. Make sure you discuss any questions you have with your health care provider.

## 2014-05-31 ENCOUNTER — Ambulatory Visit: Payer: Medicaid Other | Attending: Internal Medicine | Admitting: Internal Medicine

## 2014-05-31 ENCOUNTER — Encounter: Payer: Self-pay | Admitting: Internal Medicine

## 2014-05-31 VITALS — BP 120/84 | HR 87 | Temp 98.8°F | Resp 16 | Ht 64.0 in | Wt 133.0 lb

## 2014-05-31 DIAGNOSIS — E1040 Type 1 diabetes mellitus with diabetic neuropathy, unspecified: Secondary | ICD-10-CM | POA: Insufficient documentation

## 2014-05-31 DIAGNOSIS — E109 Type 1 diabetes mellitus without complications: Secondary | ICD-10-CM | POA: Diagnosis not present

## 2014-05-31 DIAGNOSIS — Z720 Tobacco use: Secondary | ICD-10-CM | POA: Diagnosis not present

## 2014-05-31 DIAGNOSIS — Z794 Long term (current) use of insulin: Secondary | ICD-10-CM | POA: Insufficient documentation

## 2014-05-31 DIAGNOSIS — M419 Scoliosis, unspecified: Secondary | ICD-10-CM | POA: Insufficient documentation

## 2014-05-31 DIAGNOSIS — B1920 Unspecified viral hepatitis C without hepatic coma: Secondary | ICD-10-CM | POA: Insufficient documentation

## 2014-05-31 DIAGNOSIS — R011 Cardiac murmur, unspecified: Secondary | ICD-10-CM | POA: Insufficient documentation

## 2014-05-31 DIAGNOSIS — Z87442 Personal history of urinary calculi: Secondary | ICD-10-CM | POA: Insufficient documentation

## 2014-05-31 DIAGNOSIS — H269 Unspecified cataract: Secondary | ICD-10-CM | POA: Insufficient documentation

## 2014-05-31 DIAGNOSIS — Z79899 Other long term (current) drug therapy: Secondary | ICD-10-CM | POA: Insufficient documentation

## 2014-05-31 DIAGNOSIS — Z9114 Patient's other noncompliance with medication regimen: Secondary | ICD-10-CM | POA: Insufficient documentation

## 2014-05-31 DIAGNOSIS — F1290 Cannabis use, unspecified, uncomplicated: Secondary | ICD-10-CM | POA: Insufficient documentation

## 2014-05-31 DIAGNOSIS — F4310 Post-traumatic stress disorder, unspecified: Secondary | ICD-10-CM | POA: Insufficient documentation

## 2014-05-31 DIAGNOSIS — K2970 Gastritis, unspecified, without bleeding: Secondary | ICD-10-CM | POA: Insufficient documentation

## 2014-05-31 DIAGNOSIS — M13871 Other specified arthritis, right ankle and foot: Secondary | ICD-10-CM | POA: Insufficient documentation

## 2014-05-31 DIAGNOSIS — F17210 Nicotine dependence, cigarettes, uncomplicated: Secondary | ICD-10-CM | POA: Insufficient documentation

## 2014-05-31 DIAGNOSIS — F3181 Bipolar II disorder: Secondary | ICD-10-CM | POA: Insufficient documentation

## 2014-05-31 DIAGNOSIS — Z7982 Long term (current) use of aspirin: Secondary | ICD-10-CM | POA: Insufficient documentation

## 2014-05-31 DIAGNOSIS — K3184 Gastroparesis: Secondary | ICD-10-CM | POA: Insufficient documentation

## 2014-05-31 DIAGNOSIS — E1065 Type 1 diabetes mellitus with hyperglycemia: Secondary | ICD-10-CM | POA: Insufficient documentation

## 2014-05-31 DIAGNOSIS — G40909 Epilepsy, unspecified, not intractable, without status epilepticus: Secondary | ICD-10-CM | POA: Insufficient documentation

## 2014-05-31 DIAGNOSIS — I10 Essential (primary) hypertension: Secondary | ICD-10-CM | POA: Insufficient documentation

## 2014-05-31 DIAGNOSIS — E1010 Type 1 diabetes mellitus with ketoacidosis without coma: Secondary | ICD-10-CM | POA: Insufficient documentation

## 2014-05-31 LAB — POCT URINALYSIS DIPSTICK
Bilirubin, UA: NEGATIVE
Glucose, UA: 500
Ketones, UA: NEGATIVE
Leukocytes, UA: NEGATIVE
Nitrite, UA: NEGATIVE
PROTEIN UA: NEGATIVE
RBC UA: NEGATIVE
Spec Grav, UA: 1.02
Urobilinogen, UA: 0.2
pH, UA: 7.5

## 2014-05-31 LAB — GLUCOSE, POCT (MANUAL RESULT ENTRY)
POC Glucose: 397 mg/dl — AB (ref 70–99)
POC Glucose: 346 mg/dl — AB (ref 70–99)

## 2014-05-31 MED ORDER — INSULIN GLARGINE 100 UNIT/ML ~~LOC~~ SOLN: 33.0000 [IU] | Freq: Every morning | SUBCUTANEOUS | Status: DC

## 2014-05-31 NOTE — Patient Instructions (Addendum)
Increase your Lantus to 33 units once per day  Keep Novolin the same at 8 units twice per day.  Keep a log of sugars and bring to nurse visit for review.  Will make additional changes as needed next visit    Smoking Cessation Quitting smoking is important to your health and has many advantages. However, it is not always easy to quit since nicotine is a very addictive drug. Oftentimes, people try 3 times or more before being able to quit. This document explains the best ways for you to prepare to quit smoking. Quitting takes hard work and a lot of effort, but you can do it. ADVANTAGES OF QUITTING SMOKING  You will live longer, feel better, and live better.  Your body will feel the impact of quitting smoking almost immediately.  Within 20 minutes, blood pressure decreases. Your pulse returns to its normal level.  After 8 hours, carbon monoxide levels in the blood return to normal. Your oxygen level increases.  After 24 hours, the chance of having a heart attack starts to decrease. Your breath, hair, and body stop smelling like smoke.  After 48 hours, damaged nerve endings begin to recover. Your sense of taste and smell improve.  After 72 hours, the body is virtually free of nicotine. Your bronchial tubes relax and breathing becomes easier.  After 2 to 12 weeks, lungs can hold more air. Exercise becomes easier and circulation improves.  The risk of having a heart attack, stroke, cancer, or lung disease is greatly reduced.  After 1 year, the risk of coronary heart disease is cut in half.  After 5 years, the risk of stroke falls to the same as a nonsmoker.  After 10 years, the risk of lung cancer is cut in half and the risk of other cancers decreases significantly.  After 15 years, the risk of coronary heart disease drops, usually to the level of a nonsmoker.  If you are pregnant, quitting smoking will improve your chances of having a healthy baby.  The people you live with,  especially any children, will be healthier.  You will have extra money to spend on things other than cigarettes. QUESTIONS TO THINK ABOUT BEFORE ATTEMPTING TO QUIT You may want to talk about your answers with your health care provider.  Why do you want to quit?  If you tried to quit in the past, what helped and what did not?  What will be the most difficult situations for you after you quit? How will you plan to handle them?  Who can help you through the tough times? Your family? Friends? A health care provider?  What pleasures do you get from smoking? What ways can you still get pleasure if you quit? Here are some questions to ask your health care provider:  How can you help me to be successful at quitting?  What medicine do you think would be best for me and how should I take it?  What should I do if I need more help?  What is smoking withdrawal like? How can I get information on withdrawal? GET READY  Set a quit date.  Change your environment by getting rid of all cigarettes, ashtrays, matches, and lighters in your home, car, or work. Do not let people smoke in your home.  Review your past attempts to quit. Think about what worked and what did not. GET SUPPORT AND ENCOURAGEMENT You have a better chance of being successful if you have help. You can get support in  many ways.  Tell your family, friends, and coworkers that you are going to quit and need their support. Ask them not to smoke around you.  Get individual, group, or telephone counseling and support. Programs are available at [employer] and health centers. Call your local health department for information about programs in your area.  Spiritual beliefs and practices may help some smokers quit.  Download a "quit meter" on your computer to keep track of quit statistics, such as how long you have gone without smoking, cigarettes not smoked, and money saved.  Get a self-help book about quitting smoking and staying  off tobacco. [place] yourself from urges to smoke. Talk to someone, go for a walk, or occupy your time with a task.  Change your normal routine. Take a different route to work. Drink tea instead of coffee. Eat breakfast in a different place.  Reduce your stress. Take a hot bath, exercise, or read a book.  Plan something enjoyable to do every day. Reward yourself for not smoking.  Explore interactive web-based programs that specialize in helping you quit. GET MEDICINE AND USE IT CORRECTLY Medicines can help you stop smoking and decrease the urge to smoke. Combining medicine with the above behavioral methods and support can greatly increase your chances of successfully quitting smoking.  Nicotine replacement therapy helps deliver nicotine to your body without the negative effects and risks of smoking. Nicotine replacement therapy includes nicotine gum, lozenges, inhalers, nasal sprays, and skin patches. Some may be available over-the-counter and others require a prescription.  Antidepressant medicine helps people abstain from smoking, but how this works is unknown. This medicine is available by prescription.  Nicotinic receptor partial agonist medicine simulates the effect of nicotine in your brain. This medicine is available by prescription. Ask your health care provider for advice about which medicines to use and how to use them based on your health history. Your health care provider will tell you what side effects to look out for if you choose to be on a medicine or therapy. Carefully read the information on the package. Do not use any other product containing nicotine while using a nicotine replacement product.  RELAPSE OR DIFFICULT SITUATIONS Most relapses occur within the first 3 months after quitting. Do not be discouraged if you start smoking again. Remember, most people try several times before finally quitting. You may have symptoms of withdrawal because  your body is used to nicotine. You may crave cigarettes, be irritable, feel very hungry, cough often, get headaches, or have difficulty concentrating. The withdrawal symptoms are only temporary. They are strongest when you first quit, but they will go away within 10-14 days. To reduce the chances of relapse, try to:  Avoid drinking alcohol. Drinking lowers your chances of successfully quitting.  Reduce the amount of caffeine you consume. Once you quit smoking, the amount of caffeine in your body increases and can give you symptoms, such as a rapid heartbeat, sweating, and anxiety.  Avoid smokers because they can make you want to smoke.  Do not let weight gain distract you. Many smokers will gain weight when they quit, usually less than 10 pounds. Eat a healthy diet and stay active. You can always lose the weight gained after you quit.  Find ways to improve your mood other than smoking. FOR MORE INFORMATION  www.smokefree.gov  Document Released: 05/28/2001 Document Revised: 10/18/2013 Document Reviewed: 09/12/2011 [facility] Patient Information 2015 [place], [place]. This information is not intended to  replace advice given to you by your health care provider. Make sure you discuss any questions you have with your health care provider.  

## 2014-05-31 NOTE — Progress Notes (Signed)
Patient ID: Mary [name], female   DOB: 18-Nov-1970, 43 y.o.   MRN: [id number]  CC: Diabetes follow up  HPI: Mary [name] is a 43 y.o. female here today for a follow up visit.  Patient has past medical history of  T1DM, bipolar 2 disorder, Hepatitis C, and seizure disorder.  Patient reports that she has had several days of extremely elevated blood sugars and has been going to the ER for gastroparesis.  She states that she has been using her insulin but continues to have elevated blood sugars. Patient reports that she has been to diabetic education but is still struggling with portion sizes and wanting to eat sugary foods. Her CBG log reveals fasting blood sugars of 174-598.  She had progress in November that were lower fasting values but this month reveals very high readings. She denies any complications of the medication regimen.  Patient states that needs to make a repeat appt for financial assistance, so therefore has not been able to schedule a appointment with Endocrinology.     Allergies  Allergen Reactions  . Penicillins Rash   Past Medical History  Diagnosis Date  . Kidney stone   . Seizure disorder     started with pregnancy of first son  . Gastritis   . Bipolar 2 disorder   . Post traumatic stress disorder     "flipping out" after people close to her died  . Heart murmur   . Arthritis     r ankle-S/P surgery (2007)  . Anemia     childhood  . Depression     childhood  . Hepatitis C     biopsy in 2010-no rx-was supposed to see a hepatologist in [place].  With cirrhosis  . Neuropathy     in legs and feet and hands  . Cataracts, bilateral   . Scoliosis   . Diabetes mellitus     diagnosed in 1996-always been on insulin  . DKA (diabetic ketoacidoses)     Recurrent admissions for DKA, medication non-complaince, poor social situation  . Diabetic neuropathy, type I diabetes mellitus     numbness bilaterally feet  . HTN (hypertension) 10/12/2013   Current Outpatient  Prescriptions on File Prior to Visit  Medication Sig Dispense Refill  . aspirin EC 81 MG tablet Take 81 mg by mouth daily.    . divalproex (DEPAKOTE) 250 MG DR tablet Take 250 mg by mouth 2 times daily at 12 noon and 4 pm.    . gabapentin (NEURONTIN) 300 MG capsule Take 300 mg by mouth 3 (three) times daily.    . insulin glargine (LANTUS) 100 UNIT/ML injection Inject 30 Units into the skin every morning.     . insulin NPH-regular Human (NOVOLIN 70/30) (70-30) 100 UNIT/ML injection Inject 8 Units into the skin 2 (two) times daily with a meal. 10 mL 0  . medroxyPROGESTERone (DEPO-PROVERA) 150 MG/ML injection Inject 150 mg into the muscle every 3 (three) months.    . metoCLOPramide (REGLAN) 10 MG tablet Take 1 tablet (10 mg total) by mouth 4 (four) times daily -  before meals and at bedtime. 90 tablet 0  . risperiDONE (RISPERDAL) 2 MG tablet Take 2 mg by mouth at bedtime.    . traMADol (ULTRAM) 50 MG tablet Take 50 mg by mouth every 8 (eight) hours as needed.    . ondansetron (ZOFRAN) 4 MG tablet Take 1 tablet (4 mg total) by mouth every 6 (six) hours. (Patient not taking: Reported on 05/31/2014) 12  tablet 0  . promethazine (PHENERGAN) 12.5 MG tablet Take 1 tablet (12.5 mg total) by mouth every 6 (six) hours as needed for nausea or vomiting. (Patient not taking: Reported on 05/20/2014) 30 tablet 0   No current facility-administered medications on file prior to visit.   Family History  Problem Relation Age of Onset  . Diabetes Mother     currently 20  . Fibromyalgia Mother   . Cirrhosis Father     died in 38  . Diabetes Maternal Grandmother   . Diabetes Maternal Aunt    History   Social History  . Marital Status: Single    Spouse Name: N/A    Number of Children: N/A  . Years of Education: N/A   Occupational History  . Not on file.   Social History Main Topics  . Smoking status: Current Every Day Smoker -- 0.10 packs/day for 25 years    Types: Cigarettes  . Smokeless tobacco:  Never Used  . Alcohol Use: No     Comment: Endorses hasn't been drinking  . Drug Use: 1.00 per week    Special: Marijuana     Comment: used crack last year.  every now and then uses puffs some marijuana-only on the weekends<?>  . Sexual Activity: Yes    Birth Control/ Protection: None   Other Topics Concern  . 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 Came back to [place] from [place]-was in rehab for 3 mo. Then went to recovery center ([facility]). Currently 05/2011 endorses not doing any drugs except very occasional marijuana when she's nauseous.    Lives in [place] with BF, has grown children, 2 sons. Ambulatory.                Review of Systems  Eyes: Positive for blurred vision.  Respiratory: Negative.   Cardiovascular: Negative.   Gastrointestinal: Positive for nausea, vomiting and abdominal pain.  Genitourinary: Negative for frequency.  Neurological: Positive for tingling. Negative for dizziness and headaches.  Endo/Heme/Allergies: Negative for polydipsia.  All other systems reviewed and are negative.     Objective:   Filed Vitals:   05/31/14 1407  BP: 120/84  Pulse: 87  Temp: 98.8 F (37.1 C)  Resp: 16    Physical Exam  Constitutional: She is oriented to person, place, and time.  Cardiovascular: Normal rate, regular rhythm and normal heart sounds.   Pulmonary/Chest: Effort normal and breath sounds normal.  Abdominal: Soft. Bowel sounds are normal. She exhibits no distension. There is tenderness (generalized).  Neurological: She is alert and oriented to person, place, and time.  Skin: Skin is warm and dry.     Lab Results  Component Value Date   WBC 10.4 05/25/2014   HGB 12.4 05/25/2014   HCT 39.2 05/25/2014   MCV 71.9* 05/25/2014   PLT 137* 05/25/2014   Lab Results  Component Value Date   CREATININE 0.48* 05/25/2014   BUN 14 05/25/2014   NA 132* 05/25/2014    K 4.9 05/25/2014   CL 93* 05/25/2014   CO2 22 05/25/2014    Lab Results  Component Value Date   HGBA1C 11 04/05/2014   Lipid Panel     Component Value Date/Time   CHOL  04/16/2010 0157    134        ATP III CLASSIFICATION:  <200     mg/dL   Desirable  200-239  mg/dL  Borderline High  >=240    mg/dL   High          TRIG 185* 04/16/2010 0157   HDL 40 04/16/2010 0157   CHOLHDL 3.4 04/16/2010 0157   VLDL 37 04/16/2010 0157   LDLCALC  04/16/2010 0157    57        Total Cholesterol/HDL:CHD Risk Coronary Heart Disease Risk Table                     Men   Women  1/2 Average Risk   3.4   3.3  Average Risk       5.0   4.4  2 X Average Risk   9.6   7.1  3 X Average Risk  23.4   11.0        Use the calculated Patient Ratio above and the CHD Risk Table to determine the patient's CHD Risk.        ATP III CLASSIFICATION (LDL):  <100     mg/dL   Optimal  100-129  mg/dL   Near or Above                    Optimal  130-159  mg/dL   Borderline  160-189  mg/dL   High  >190     mg/dL   Very High       Assessment and plan:   [place] was seen today for follow-up.  Diagnoses and associated orders for this visit:  Type 1 diabetes mellitus without complication - Glucose (CBG) - Urinalysis Dipstick - Increased by 3 units nightly. insulin glargine (LANTUS) 100 UNIT/ML injection; Inject 0.33 mLs (33 Units total) into the skin every morning. - Glucose (CBG) Patients diabetes remains uncontrolled as evidence by hemoglobin a1c >8.  Patient has been non-compliant with medication regimen. Stressed the multiple complications associated with uncontrolled diabetes.  Patient will stay on current medication dose and report back to clinic with cbg log in 2 weeks.   Tobacco abuse Smoking cessation discussed for 3 minutes, patient is not willing to quit at this time. Will continue to assess on each visit. Discussed increased risk for diseases such as cancer, heart disease, and stroke.     Return for 1 and 3 week RN visits-cbg log review.        [name], NP-C [facility] [phone] 05/31/2014, 2:38 PM

## 2014-05-31 NOTE — Progress Notes (Signed)
Pt is here today following up on her diabetes. Pt reports the she is in extreme constant pain in her legs. Pt is currently smoking.

## 2014-06-06 ENCOUNTER — Inpatient Hospital Stay (HOSPITAL_COMMUNITY): Payer: MEDICAID

## 2014-06-06 ENCOUNTER — Inpatient Hospital Stay (HOSPITAL_COMMUNITY)
Admission: EM | Admit: 2014-06-06 | Discharge: 2014-06-08 | DRG: 637 | Disposition: A | Discharge status: Home / Self Care | Payer: Medicaid Other | Attending: Internal Medicine | Admitting: Internal Medicine

## 2014-06-06 ENCOUNTER — Encounter (HOSPITAL_COMMUNITY): Payer: Self-pay | Admitting: Emergency Medicine

## 2014-06-06 ENCOUNTER — Emergency Department (HOSPITAL_COMMUNITY): Payer: MEDICAID

## 2014-06-06 DIAGNOSIS — E1040 Type 1 diabetes mellitus with diabetic neuropathy, unspecified: Secondary | ICD-10-CM | POA: Diagnosis present

## 2014-06-06 DIAGNOSIS — R1084 Generalized abdominal pain: Secondary | ICD-10-CM | POA: Diagnosis not present

## 2014-06-06 DIAGNOSIS — G40909 Epilepsy, unspecified, not intractable, without status epilepticus: Secondary | ICD-10-CM | POA: Diagnosis present

## 2014-06-06 DIAGNOSIS — R109 Unspecified abdominal pain: Secondary | ICD-10-CM | POA: Insufficient documentation

## 2014-06-06 DIAGNOSIS — F329 Major depressive disorder, single episode, unspecified: Secondary | ICD-10-CM | POA: Diagnosis present

## 2014-06-06 DIAGNOSIS — E1010 Type 1 diabetes mellitus with ketoacidosis without coma: Principal | ICD-10-CM | POA: Diagnosis present

## 2014-06-06 DIAGNOSIS — R569 Unspecified convulsions: Secondary | ICD-10-CM | POA: Diagnosis present

## 2014-06-06 DIAGNOSIS — E1110 Type 2 diabetes mellitus with ketoacidosis without coma: Secondary | ICD-10-CM | POA: Diagnosis present

## 2014-06-06 DIAGNOSIS — K3184 Gastroparesis: Secondary | ICD-10-CM | POA: Diagnosis present

## 2014-06-06 DIAGNOSIS — Z6822 Body mass index (BMI) 22.0-22.9, adult: Secondary | ICD-10-CM

## 2014-06-06 DIAGNOSIS — D638 Anemia in other chronic diseases classified elsewhere: Secondary | ICD-10-CM | POA: Diagnosis present

## 2014-06-06 DIAGNOSIS — E1043 Type 1 diabetes mellitus with diabetic autonomic (poly)neuropathy: Secondary | ICD-10-CM | POA: Diagnosis present

## 2014-06-06 DIAGNOSIS — F3181 Bipolar II disorder: Secondary | ICD-10-CM | POA: Diagnosis present

## 2014-06-06 DIAGNOSIS — E43 Unspecified severe protein-calorie malnutrition: Secondary | ICD-10-CM | POA: Diagnosis present

## 2014-06-06 DIAGNOSIS — D72829 Elevated white blood cell count, unspecified: Secondary | ICD-10-CM

## 2014-06-06 DIAGNOSIS — E860 Dehydration: Secondary | ICD-10-CM | POA: Diagnosis present

## 2014-06-06 DIAGNOSIS — F4310 Post-traumatic stress disorder, unspecified: Secondary | ICD-10-CM | POA: Diagnosis present

## 2014-06-06 DIAGNOSIS — I10 Essential (primary) hypertension: Secondary | ICD-10-CM | POA: Diagnosis present

## 2014-06-06 DIAGNOSIS — Z7982 Long term (current) use of aspirin: Secondary | ICD-10-CM

## 2014-06-06 DIAGNOSIS — Z794 Long term (current) use of insulin: Secondary | ICD-10-CM

## 2014-06-06 DIAGNOSIS — E1069 Type 1 diabetes mellitus with other specified complication: Secondary | ICD-10-CM | POA: Diagnosis present

## 2014-06-06 LAB — BLOOD GAS, VENOUS
Acid-base deficit: 3.3 mmol/L — ABNORMAL HIGH (ref 0.0–2.0)
BICARBONATE: 19.1 meq/L — AB (ref 20.0–24.0)
FIO2: 0.21 %
O2 SAT: 72.7 %
PCO2 VEN: 28.2 mmHg — AB (ref 45.0–50.0)
Patient temperature: 98.6
TCO2: 16.9 mmol/L (ref 0–100)
pH, Ven: 7.445 — ABNORMAL HIGH (ref 7.250–7.300)

## 2014-06-06 LAB — BASIC METABOLIC PANEL
Anion gap: 28 — ABNORMAL HIGH (ref 5–15)
Anion gap: 16 — ABNORMAL HIGH (ref 5–15)
Anion gap: 15 (ref 5–15)
BUN: 25 mg/dL — AB (ref 6–23)
BUN: 23 mg/dL (ref 6–23)
BUN: 23 mg/dL (ref 6–23)
CALCIUM: 11.2 mg/dL — AB (ref 8.4–10.5)
CALCIUM: 9.5 mg/dL (ref 8.4–10.5)
CHLORIDE: 104 meq/L (ref 96–112)
CO2: 17 meq/L — AB (ref 19–32)
CO2: 21 mEq/L (ref 19–32)
CO2: 21 mEq/L (ref 19–32)
CREATININE: 0.47 mg/dL — AB (ref 0.50–1.10)
Calcium: 9.5 mg/dL (ref 8.4–10.5)
Chloride: 95 mEq/L — ABNORMAL LOW (ref 96–112)
Chloride: 108 mEq/L (ref 96–112)
Creatinine, Ser: 0.66 mg/dL (ref 0.50–1.10)
Creatinine, Ser: 0.53 mg/dL (ref 0.50–1.10)
GFR calc Af Amer: >90 mL/min (ref >90)
GFR calc Af Amer: >90 mL/min (ref >90)
GFR calc Af Amer: >90 mL/min (ref >90)
GFR calc non Af Amer: >90 mL/min (ref >90)
GFR calc non Af Amer: >90 mL/min (ref >90)
GLUCOSE: 526 mg/dL — AB (ref 70–99)
Glucose, Bld: 253 mg/dL — ABNORMAL HIGH (ref 70–99)
Glucose, Bld: 171 mg/dL — ABNORMAL HIGH (ref 70–99)
POTASSIUM: 4.3 meq/L (ref 3.7–5.3)
Potassium: 4.6 mEq/L (ref 3.7–5.3)
Potassium: 6.9 mEq/L (ref 3.7–5.3)
SODIUM: 144 meq/L (ref 137–147)
Sodium: 140 mEq/L (ref 137–147)
Sodium: 141 mEq/L (ref 137–147)

## 2014-06-06 LAB — I-STAT TROPONIN, ED: TROPONIN I, POC: 0 ng/mL (ref 0.00–0.08)

## 2014-06-06 LAB — CBC WITH DIFFERENTIAL/PLATELET
Basophils Absolute: 0 10*3/uL (ref 0.0–0.1)
Basophils Relative: 0 % (ref 0–1)
EOS PCT: 0 % (ref 0–5)
Eosinophils Absolute: 0 10*3/uL (ref 0.0–0.7)
HCT: 43 % (ref 36.0–46.0)
HEMOGLOBIN: 13.8 g/dL (ref 12.0–15.0)
LYMPHS ABS: 2.6 10*3/uL (ref 0.7–4.0)
Lymphocytes Relative: 18 % (ref 12–46)
MCH: 23.3 pg — AB (ref 26.0–34.0)
MCHC: 32.1 g/dL (ref 30.0–36.0)
MCV: 72.5 fL — AB (ref 78.0–100.0)
MONO ABS: 0.7 10*3/uL (ref 0.1–1.0)
MONOS PCT: 5 % (ref 3–12)
Neutro Abs: 11.1 10*3/uL — ABNORMAL HIGH (ref 1.7–7.7)
Neutrophils Relative %: 77 % (ref 43–77)
Platelets: 215 10*3/uL (ref 150–400)
RBC: 5.93 MIL/uL — AB (ref 3.87–5.11)
RDW: 15.5 % (ref 11.5–15.5)
WBC: 14.4 10*3/uL — ABNORMAL HIGH (ref 4.0–10.5)

## 2014-06-06 LAB — GLUCOSE, CAPILLARY
GLUCOSE-CAPILLARY: 262 mg/dL — AB (ref 70–99)
GLUCOSE-CAPILLARY: 238 mg/dL — AB (ref 70–99)
GLUCOSE-CAPILLARY: 197 mg/dL — AB (ref 70–99)
Glucose-Capillary: 356 mg/dL — ABNORMAL HIGH (ref 70–99)
Glucose-Capillary: 158 mg/dL — ABNORMAL HIGH (ref 70–99)

## 2014-06-06 LAB — URINE MICROSCOPIC-ADD ON

## 2014-06-06 LAB — URINALYSIS, ROUTINE W REFLEX MICROSCOPIC
BILIRUBIN URINE: NEGATIVE
Glucose, UA: >1000 mg/dL — AB
Hgb urine dipstick: NEGATIVE
Ketones, ur: 40 mg/dL — AB
LEUKOCYTES UA: NEGATIVE
Nitrite: NEGATIVE
PH: 5.5 (ref 5.0–8.0)
Protein, ur: NEGATIVE mg/dL
Specific Gravity, Urine: 1.037 — ABNORMAL HIGH (ref 1.005–1.030)
UROBILINOGEN UA: 0.2 mg/dL (ref 0.0–1.0)

## 2014-06-06 LAB — I-STAT CHEM 8, ED
BUN: 29 mg/dL — AB (ref 6–23)
CALCIUM ION: 1.16 mmol/L (ref 1.12–1.23)
Chloride: 104 mEq/L (ref 96–112)
Creatinine, Ser: 0.6 mg/dL (ref 0.50–1.10)
Glucose, Bld: 536 mg/dL — ABNORMAL HIGH (ref 70–99)
HEMATOCRIT: 49 % — AB (ref 36.0–46.0)
HEMOGLOBIN: 16.7 g/dL — AB (ref 12.0–15.0)
Potassium: 4.4 mEq/L (ref 3.7–5.3)
SODIUM: 141 meq/L (ref 137–147)
TCO2: 17 mmol/L (ref 0–100)

## 2014-06-06 LAB — MRSA PCR SCREENING: MRSA by PCR: NEGATIVE

## 2014-06-06 LAB — PREGNANCY, URINE: Preg Test, Ur: NEGATIVE

## 2014-06-06 LAB — CBG MONITORING, ED
GLUCOSE-CAPILLARY: 477 mg/dL — AB (ref 70–99)
GLUCOSE-CAPILLARY: 467 mg/dL — AB (ref 70–99)
GLUCOSE-CAPILLARY: 329 mg/dL — AB (ref 70–99)

## 2014-06-06 LAB — MAGNESIUM: Magnesium: 2.1 mg/dL (ref 1.5–2.5)

## 2014-06-06 LAB — I-STAT CG4 LACTIC ACID, ED: Lactic Acid, Venous: 4.52 mmol/L — ABNORMAL HIGH (ref 0.5–2.2)

## 2014-06-06 LAB — KETONES, QUALITATIVE

## 2014-06-06 MED ORDER — PROMETHAZINE HCL 25 MG/ML IJ SOLN
12.5000 mg | INTRAMUSCULAR | Status: DC | PRN
  Administered 2014-06-07: 25 mg via INTRAVENOUS
  Filled 2014-06-06: qty 1

## 2014-06-06 MED ORDER — POTASSIUM CHLORIDE 10 MEQ/100ML IV SOLN
10.0000 meq | INTRAVENOUS | Status: AC
  Administered 2014-06-06 (×2): 10 meq via INTRAVENOUS
  Filled 2014-06-06 (×2): qty 100

## 2014-06-06 MED ORDER — SODIUM CHLORIDE 0.9 % IV SOLN
INTRAVENOUS | Status: DC
  Administered 2014-06-06: 4.1 [IU]/h via INTRAVENOUS
  Administered 2014-06-06: 2.7 [IU]/h via INTRAVENOUS
  Filled 2014-06-06: qty 2.5

## 2014-06-06 MED ORDER — MORPHINE SULFATE 4 MG/ML IJ SOLN
4.0000 mg | Freq: Once | INTRAMUSCULAR | Status: AC
  Administered 2014-06-06: 4 mg via INTRAVENOUS
  Filled 2014-06-06: qty 1

## 2014-06-06 MED ORDER — HYDROMORPHONE HCL 1 MG/ML IJ SOLN
1.0000 mg | INTRAMUSCULAR | Status: DC | PRN
  Administered 2014-06-07 – 2014-06-08 (×5): 1 mg via INTRAVENOUS
  Filled 2014-06-06 (×5): qty 1

## 2014-06-06 MED ORDER — ONDANSETRON HCL 4 MG/2ML IJ SOLN
4.0000 mg | Freq: Once | INTRAMUSCULAR | Status: AC
  Administered 2014-06-06: 4 mg via INTRAVENOUS
  Filled 2014-06-06: qty 2

## 2014-06-06 MED ORDER — GABAPENTIN 300 MG PO CAPS
300.0000 mg | ORAL_CAPSULE | Freq: Three times a day (TID) | ORAL | Status: DC
  Administered 2014-06-06 – 2014-06-08 (×6): 300 mg via ORAL
  Filled 2014-06-06 (×8): qty 1

## 2014-06-06 MED ORDER — SODIUM CHLORIDE 0.9 % IV SOLN
INTRAVENOUS | Status: DC
  Administered 2014-06-06: 15:00:00 via INTRAVENOUS

## 2014-06-06 MED ORDER — SODIUM CHLORIDE 0.9 % IV SOLN: 1000.0000 mL | Freq: Once | INTRAVENOUS | Status: DC

## 2014-06-06 MED ORDER — SODIUM CHLORIDE 0.9 % IV BOLUS (SEPSIS)
1000.0000 mL | Freq: Once | INTRAVENOUS | Status: AC
  Administered 2014-06-06: 1000 mL via INTRAVENOUS

## 2014-06-06 MED ORDER — SODIUM CHLORIDE 0.9 % IV SOLN: INTRAVENOUS | Status: DC

## 2014-06-06 MED ORDER — METOCLOPRAMIDE HCL 10 MG PO TABS
10.0000 mg | ORAL_TABLET | Freq: Three times a day (TID) | ORAL | Status: DC
  Administered 2014-06-06 – 2014-06-08 (×8): 10 mg via ORAL
  Filled 2014-06-06 (×11): qty 1

## 2014-06-06 MED ORDER — SODIUM CHLORIDE 0.9 % IV SOLN
INTRAVENOUS | Status: DC
  Filled 2014-06-06: qty 2.5

## 2014-06-06 MED ORDER — ENOXAPARIN SODIUM 40 MG/0.4ML ~~LOC~~ SOLN
40.0000 mg | SUBCUTANEOUS | Status: DC
  Administered 2014-06-06 – 2014-06-07 (×2): 40 mg via SUBCUTANEOUS
  Filled 2014-06-06 (×3): qty 0.4

## 2014-06-06 MED ORDER — LORAZEPAM 2 MG/ML IJ SOLN
1.0000 mg | Freq: Once | INTRAMUSCULAR | Status: AC
  Administered 2014-06-06: 1 mg via INTRAVENOUS
  Filled 2014-06-06: qty 1

## 2014-06-06 MED ORDER — RISPERIDONE 2 MG PO TABS
2.0000 mg | ORAL_TABLET | Freq: Every day | ORAL | Status: DC
  Administered 2014-06-06 – 2014-06-07 (×2): 2 mg via ORAL
  Filled 2014-06-06 (×5): qty 1

## 2014-06-06 MED ORDER — OXYCODONE-ACETAMINOPHEN 5-325 MG PO TABS
1.0000 | ORAL_TABLET | ORAL | Status: DC | PRN
  Administered 2014-06-06 (×2): 2 via ORAL
  Filled 2014-06-06 (×3): qty 2

## 2014-06-06 MED ORDER — DEXTROSE-NACL 5-0.45 % IV SOLN
INTRAVENOUS | Status: DC
Start: 2014-06-06 — End: 2014-06-07
  Administered 2014-06-06: 20:00:00 via INTRAVENOUS

## 2014-06-06 MED ORDER — ASPIRIN EC 81 MG PO TBEC
81.0000 mg | DELAYED_RELEASE_TABLET | Freq: Every day | ORAL | Status: DC
  Administered 2014-06-07 – 2014-06-08 (×2): 81 mg via ORAL
  Filled 2014-06-06 (×2): qty 1

## 2014-06-06 MED ORDER — DIVALPROEX SODIUM 250 MG PO DR TAB
250.0000 mg | DELAYED_RELEASE_TABLET | Freq: Two times a day (BID) | ORAL | Status: DC
  Administered 2014-06-06 – 2014-06-08 (×4): 250 mg via ORAL
  Filled 2014-06-06 (×7): qty 1

## 2014-06-06 MED ORDER — SODIUM CHLORIDE 0.9 % IV SOLN: 1000.0000 mL | INTRAVENOUS | Status: DC

## 2014-06-06 MED ORDER — SODIUM CHLORIDE 0.9 % IV SOLN: INTRAVENOUS | Status: AC

## 2014-06-06 NOTE — ED Notes (Signed)
PER EMS - pt from home with c/o abd pain, diffuse, n/v, also hyperglycemic per EMS at 502

## 2014-06-06 NOTE — Progress Notes (Signed)
MD aware of critical potassium level; will order for stat redraw BMET now.

## 2014-06-06 NOTE — Progress Notes (Signed)
Lab called with a critical potassium value of 6.9 although they also reported that the specimen was hemolyzed. Pt has q4h BMETs ordered at this time. Will continue to monitor and make MD aware.

## 2014-06-06 NOTE — ED Provider Notes (Signed)
CSN: [id number]     Arrival date & time 06/06/14  1033 History   First MD Initiated Contact with Patient 06/06/14 1042     Chief Complaint  Patient presents with  . Abdominal Pain    n/v  . Hyperglycemia     (Consider location/radiation/quality/duration/timing/severity/associated sxs/prior Treatment) HPI Comments: Patient presents via EMS with diffuse abdominal pain with nausea and vomiting that onset yesterday. She is quite dramatic in presentation and history is difficult to obtain. She's had previous visits for abdominal pain in DKA. She states she took her insulin yesterday but none today. Sugar 503 on arrival. Endorses constant sharp stabbing abdominal pain that is similar to her previous. She has a history of gastroparesis. No fever, chest pain, shortness of breath, cough or dysuria. Denies any blood in her stool or diarrhea.  Patient is a 43 y.o. female presenting with hyperglycemia. The history is provided by the patient and the EMS personnel. The history is limited by the condition of the patient.  Hyperglycemia Associated symptoms: abdominal pain, fatigue, nausea and vomiting   Associated symptoms: no chest pain, no dizziness, no dysuria, no fever and no shortness of breath     Past Medical History  Diagnosis Date  . Kidney stone   . Seizure disorder     started with pregnancy of first son  . Gastritis   . Bipolar 2 disorder   . Post traumatic stress disorder     "flipping out" after people close to her died  . Heart murmur   . Arthritis     r ankle-S/P surgery (2007)  . Anemia     childhood  . Depression     childhood  . Hepatitis C     biopsy in 2010-no rx-was supposed to see a hepatologist in [place].  With cirrhosis  . Neuropathy     in legs and feet and hands  . Cataracts, bilateral   . Scoliosis   . Diabetes mellitus     diagnosed in 1996-always been on insulin  . DKA (diabetic ketoacidoses)     Recurrent admissions for DKA, medication  non-complaince, poor social situation  . Diabetic neuropathy, type I diabetes mellitus     numbness bilaterally feet  . HTN (hypertension) 10/12/2013   Past Surgical History  Procedure Laterality Date  . Ankle surgery  2007  . Endometrial biopsy  06/22/2012  . Cholecystectomy  04/07/2013  . Cholecystectomy N/A 04/07/2013    Procedure: LAPAROSCOPIC CHOLECYSTECTOMY WITH INTRAOPERATIVE CHOLANGIOGRAM;  Surgeon: Adin Hector, MD;  Location: [facility];  Service: General;  Laterality: N/A;  . Cesarean section      3 times   Family History  Problem Relation Age of Onset  . Diabetes Mother     currently 51  . Fibromyalgia Mother   . Cirrhosis Father     died in 54  . Diabetes Maternal Grandmother   . Diabetes Maternal Aunt    History  Substance Use Topics  . Smoking status: Current Every Day Smoker -- 0.10 packs/day for 25 years    Types: Cigarettes  . Smokeless tobacco: Never Used  . Alcohol Use: No     Comment: Endorses hasn't been drinking   OB History    Gravida Para Term Preterm AB TAB SAB Ectopic Multiple Living   4 3 3  0 1 0 1 0 1 2     Review of Systems  Constitutional: Positive for activity change, appetite change and fatigue. Negative for fever.  Eyes: Negative for  visual disturbance.  Respiratory: Negative for cough, chest tightness and shortness of breath.   Cardiovascular: Negative for chest pain.  Gastrointestinal: Positive for nausea, vomiting and abdominal pain. Negative for diarrhea.  Genitourinary: Negative for dysuria, hematuria, vaginal bleeding and vaginal discharge.  Musculoskeletal: Negative for myalgias, back pain and arthralgias.  Neurological: Negative for dizziness, weakness and headaches.   A complete 10 system review of systems was obtained and all systems are negative except as noted in the HPI and PMH.     Allergies  Penicillins  Home Medications   Prior to Admission medications   Medication Sig Start Date End Date Taking? Authorizing  Provider  aspirin EC 81 MG tablet Take 81 mg by mouth daily.   Yes Historical Provider, MD  divalproex (DEPAKOTE) 250 MG DR tablet Take 250 mg by mouth 2 times daily at 12 noon and 4 pm.   Yes Historical Provider, MD  gabapentin (NEURONTIN) 300 MG capsule Take 300 mg by mouth 3 (three) times daily.   Yes Historical Provider, MD  insulin glargine (LANTUS) 100 UNIT/ML injection Inject 0.33 mLs (33 Units total) into the skin every morning. 05/31/14  Yes Lance Bosch, NP  insulin NPH-regular Human (NOVOLIN 70/30) (70-30) 100 UNIT/ML injection Inject 8 Units into the skin 2 (two) times daily with a meal. 04/13/14  Yes Donne Hazel, MD  medroxyPROGESTERone (DEPO-PROVERA) 150 MG/ML injection Inject 150 mg into the muscle every 3 (three) months.   Yes Historical Provider, MD  metoCLOPramide (REGLAN) 10 MG tablet Take 1 tablet (10 mg total) by mouth 4 (four) times daily -  before meals and at bedtime. 04/13/14  Yes Donne Hazel, MD  risperiDONE (RISPERDAL) 2 MG tablet Take 2 mg by mouth at bedtime.   Yes Historical Provider, MD  ondansetron (ZOFRAN) 4 MG tablet Take 1 tablet (4 mg total) by mouth every 6 (six) hours. Patient not taking: Reported on 05/31/2014 05/26/14   Domenic Moras, PA-C  promethazine (PHENERGAN) 12.5 MG tablet Take 1 tablet (12.5 mg total) by mouth every 6 (six) hours as needed for nausea or vomiting. Patient not taking: Reported on 05/20/2014 05/10/14   Johnna Acosta, MD   BP 142/63 mmHg  Pulse 96  Temp(Src) 98.8 F (37.1 C) (Oral)  Resp 16  Ht 5\' 4"  (1.626 m)  Wt 130 lb (58.968 kg)  BMI 22.30 kg/m2  SpO2 99% Physical Exam  Constitutional: She is oriented to person, place, and time. She appears well-developed and well-nourished. She appears distressed.  Anxious, uncooperative  HENT:  Head: Normocephalic and atraumatic.  Mouth/Throat: Oropharynx is clear and moist. No oropharyngeal exudate.  Dry mucous membranes  Eyes: Conjunctivae and EOM are normal. Pupils are equal,  round, and reactive to light.  Neck: Normal range of motion. Neck supple.  No meningismus.  Cardiovascular: Normal rate, regular rhythm, normal heart sounds and intact distal pulses.   No murmur heard. Pulmonary/Chest: Effort normal and breath sounds normal. No respiratory distress.  Abdominal: Soft. There is tenderness. There is no rebound and no guarding.  Diffuse tenderness without peritoneal signs  Musculoskeletal: Normal range of motion. She exhibits no edema or tenderness.  Neurological: She is alert and oriented to person, place, and time. No cranial nerve deficit. She exhibits normal muscle tone. Coordination normal.  No ataxia on finger to nose bilaterally. No pronator drift. 5/5 strength throughout. CN 2-12 intact. Negative Romberg. Equal grip strength. Sensation intact. Gait is normal.   Skin: Skin is warm. No rash noted.  Psychiatric: She has a normal mood and affect. Her behavior is normal.  Nursing note and vitals reviewed.   ED Course  Procedures (including critical care time) Labs Review Labs Reviewed  CBC WITH DIFFERENTIAL - Abnormal; Notable for the following:    WBC 14.4 (*)    RBC 5.93 (*)    MCV 72.5 (*)    MCH 23.3 (*)    Neutro Abs 11.1 (*)    All other components within normal limits  BASIC METABOLIC PANEL - Abnormal; Notable for the following:    Chloride 95 (*)    CO2 17 (*)    Glucose, Bld 526 (*)    BUN 25 (*)    Calcium 11.2 (*)    Anion gap 28 (*)    All other components within normal limits  KETONES, QUALITATIVE - Abnormal; Notable for the following:    Acetone, Bld SMALL (*)    All other components within normal limits  BLOOD GAS, VENOUS - Abnormal; Notable for the following:    pH, Ven 7.445 (*)    pCO2, Ven 28.2 (*)    Bicarbonate 19.1 (*)    Acid-base deficit 3.3 (*)    All other components within normal limits  URINALYSIS, ROUTINE W REFLEX MICROSCOPIC - Abnormal; Notable for the following:    Specific Gravity, Urine 1.037 (*)     Glucose, UA >1000 (*)    Ketones, ur 40 (*)    All other components within normal limits  BASIC METABOLIC PANEL - Abnormal; Notable for the following:    Potassium 6.9 (*)    Glucose, Bld 253 (*)    Creatinine, Ser 0.47 (*)    Anion gap 16 (*)    All other components within normal limits  GLUCOSE, CAPILLARY - Abnormal; Notable for the following:    Glucose-Capillary 356 (*)    All other components within normal limits  GLUCOSE, CAPILLARY - Abnormal; Notable for the following:    Glucose-Capillary 238 (*)    All other components within normal limits  GLUCOSE, CAPILLARY - Abnormal; Notable for the following:    Glucose-Capillary 197 (*)    All other components within normal limits  I-STAT CG4 LACTIC ACID, ED - Abnormal; Notable for the following:    Lactic Acid, Venous 4.52 (*)    All other components within normal limits  CBG MONITORING, ED - Abnormal; Notable for the following:    Glucose-Capillary 477 (*)    All other components within normal limits  I-STAT CHEM 8, ED - Abnormal; Notable for the following:    BUN 29 (*)    Glucose, Bld 536 (*)    Hemoglobin 16.7 (*)    HCT 49.0 (*)    All other components within normal limits  CBG MONITORING, ED - Abnormal; Notable for the following:    Glucose-Capillary 467 (*)    All other components within normal limits  CBG MONITORING, ED - Abnormal; Notable for the following:    Glucose-Capillary 329 (*)    All other components within normal limits  MRSA PCR SCREENING  PREGNANCY, URINE  URINE MICROSCOPIC-ADD ON  MAGNESIUM  BASIC METABOLIC PANEL  BASIC METABOLIC PANEL  BASIC METABOLIC PANEL  I-STAT TROPOININ, ED    Imaging Review No results found.   EKG Interpretation   Date/Time:  Monday June 06 2014 13:43:31 EST Ventricular Rate:  100 PR Interval:  97 QRS Duration: 83 QT Interval:  360 QTC Calculation: 464 R Axis:   65 Text Interpretation:  Sinus tachycardia  Minimal ST depression, inferior  leads No significant  change was found Confirmed by Wyvonnia Dusky  MD, Chukwuemeka Artola  [id number]) on 06/06/2014 1:47:14 PM      MDM   Final diagnoses:  Abdominal pain  Diabetic ketoacidosis without coma associated with type 1 diabetes mellitus   Diabetic with history of chronic abdominal pain presenting with abdominal pain and vomiting. She appears mildly dehydrated. We'll check blood sugar, rule out DKA, give IV fluids.  Lactate 4.5 . Sugar 500. Anion gap 28.  IVF, insulin gtt. K 4.4 No evidence of infectious symptoms.  Aggressive hydration and insulin gtt for DKA.  PH 7.44. Admission d/w Dr. Doyle Askew.  CRITICAL CARE Performed by: Ezequiel Essex Total critical care time: 35 Critical care time was exclusive of separately billable procedures and treating other patients. Critical care was necessary to treat or prevent imminent or life-threatening deterioration. Critical care was time spent personally by me on the following activities: development of treatment plan with patient and/or surrogate as well as nursing, discussions with consultants, evaluation of patient's response to treatment, examination of patient, obtaining history from patient or surrogate, ordering and performing treatments and interventions, ordering and review of laboratory studies, ordering and review of radiographic studies, pulse oximetry and re-evaluation of patient's condition.   Ezequiel Essex, MD 06/06/14 [phone]

## 2014-06-06 NOTE — Significant Event (Signed)
Patient got CXR with the ABD series (read as "lungs clear"), canceling the extra CXR order.

## 2014-06-06 NOTE — H&P (Signed)
[facility] History and Physical  Mary [name]:[id number] DOB: [birth year] DOA: 06/06/2014  Referring physician: ED physician PCP: [name], NP   Chief Complaint: nausea and weakness   HPI:  Pt is 43 yo female with known history of uncontrolled DM with frequent DKA's and frequent admissions for this, also with known gastroparesis, now presented to [facility] after progressive weakness, nausea and non bloody vomiting which pt explains always results in DKA as she is unable to give her self any insulin due to weakness. She denies any fevers, chills, no chest pain or shortness of breath. Pt explains she has abdominal cramping typical for her with gastroparesis flares. She explains she is trying to be compliant and felt as she was doing well until few days prior to this admission when nausea and vomiting started.   In ED, pt noted to be hemodynamically stable, VSS. Blood work consistent with DKA, pt started on insulin drip and [facility] asked to admit to SDU for further evaluation.   Assessment and Plan: Active Problems:   DKA (diabetic ketoacidoses) - AG on admission 28 with bicarb of 17 - admission to SDU - place on glucose stabilizer, insulin drip, IVF - monitor BMP Q2 hours until gap closes - hold off on long acting insulin until gap closes - keep NPO for now and allow ice chips   Nausea and vomiting - secondary to gastroparesis in the setting on uncontrolled diabetes - continue Reglan - provide antiemetics as needed - keep NPO for now and provide IVF   Leukocytosis  - possibly secondary to DKA, no clear infectious etiology elicited - UA is unremarkable for an infection  - CXR pending  - hold off on ABX for now    Hypercalcemia - from dehydration - IVF provided and repeat BMP already indicating that Ca is now WNL   Severe PCM - in the setting of acute on chronic illness of uncontrolled DM - now NPO until gastroparesis calms down    Uncontrolled DM with complications of  gastroparesis and neuropathy - continue Reglan and Gabapentin    Seizures - continue Depakote - seizure precautions   Radiological Exams on Admission: No results found.   Code Status: Full Family Communication: Pt at bedside Disposition Plan: Admit for further evaluation     Review of Systems:  Constitutional: Negative for fever, chills and malaise/fatigue. Negative for diaphoresis.  HENT: Negative for hearing loss, ear pain, nosebleeds, congestion, sore throat, neck pain, tinnitus and ear discharge.   Eyes: Negative for blurred vision, double vision, photophobia, pain, discharge and redness.  Respiratory: Negative for cough, hemoptysis, sputum production, shortness of breath, wheezing and stridor.   Cardiovascular: Negative for chest pain, palpitations, orthopnea, claudication and leg swelling.  Gastrointestinal: Negative for heartburn, constipation, blood in stool and melena.  Genitourinary: Negative for dysuria, urgency, frequency, hematuria and flank pain.  Musculoskeletal: Negative for myalgias, back pain, joint pain and falls.  Skin: Negative for itching and rash.  Neurological: Negative for dizziness and weakness.  Endo/Heme/Allergies: Negative for environmental allergies and polydipsia. Does not bruise/bleed easily.  Psychiatric/Behavioral: Negative for suicidal ideas. The patient is not nervous/anxious.      Past Medical History  Diagnosis Date  . Kidney stone   . Seizure disorder     started with pregnancy of first son  . Gastritis   . Bipolar 2 disorder   . Post traumatic stress disorder     "flipping out" after people close to her died  . Heart murmur   .  Arthritis     r ankle-S/P surgery (2007)  . Anemia     childhood  . Depression     childhood  . Hepatitis C     biopsy in 2010-no rx-was supposed to see a hepatologist in [place].  With cirrhosis  . Neuropathy     in legs and feet and hands  . Cataracts, bilateral   . Scoliosis   . Diabetes  mellitus     diagnosed in 1996-always been on insulin  . DKA (diabetic ketoacidoses)     Recurrent admissions for DKA, medication non-complaince, poor social situation  . Diabetic neuropathy, type I diabetes mellitus     numbness bilaterally feet  . HTN (hypertension) 10/12/2013    Past Surgical History  Procedure Laterality Date  . Ankle surgery  2007  . Endometrial biopsy  06/22/2012  . Cholecystectomy  04/07/2013  . Cholecystectomy N/A 04/07/2013    Procedure: LAPAROSCOPIC CHOLECYSTECTOMY WITH INTRAOPERATIVE CHOLANGIOGRAM;  Surgeon: Adin Hector, MD;  Location: [facility];  Service: General;  Laterality: N/A;  . Cesarean section      3 times    Social History:  reports that she has been smoking Cigarettes.  She has a 2.5 pack-year smoking history. She has never used smokeless tobacco. She reports that she uses illicit drugs (Marijuana) about once per week. She reports that she does not drink alcohol.  Allergies  Allergen Reactions  . Penicillins Rash    Family History  Problem Relation Age of Onset  . Diabetes Mother     currently 49  . Fibromyalgia Mother   . Cirrhosis Father     died in 79  . Diabetes Maternal Grandmother   . Diabetes Maternal Aunt     Prior to Admission medications   Medication Sig Start Date End Date Taking? Authorizing Provider  aspirin EC 81 MG tablet Take 81 mg by mouth daily.   Yes Historical Provider, MD  divalproex (DEPAKOTE) 250 MG DR tablet Take 250 mg by mouth 2 times daily at 12 noon and 4 pm.   Yes Historical Provider, MD  gabapentin (NEURONTIN) 300 MG capsule Take 300 mg by mouth 3 (three) times daily.   Yes Historical Provider, MD  insulin glargine (LANTUS) 100 UNIT/ML injection Inject 0.33 mLs (33 Units total) into the skin every morning. 05/31/14  Yes Lance Bosch, NP  insulin NPH-regular Human (NOVOLIN 70/30) (70-30) 100 UNIT/ML injection Inject 8 Units into the skin 2 (two) times daily with a meal. 04/13/14  Yes Donne Hazel, MD   medroxyPROGESTERone (DEPO-PROVERA) 150 MG/ML injection Inject 150 mg into the muscle every 3 (three) months.   Yes Historical Provider, MD  metoCLOPramide (REGLAN) 10 MG tablet Take 1 tablet (10 mg total) by mouth 4 (four) times daily -  before meals and at bedtime. 04/13/14  Yes Donne Hazel, MD  risperiDONE (RISPERDAL) 2 MG tablet Take 2 mg by mouth at bedtime.   Yes Historical Provider, MD  ondansetron (ZOFRAN) 4 MG tablet Take 1 tablet (4 mg total) by mouth every 6 (six) hours. Patient not taking: Reported on 05/31/2014 05/26/14   Domenic Moras, PA-C  promethazine (PHENERGAN) 12.5 MG tablet Take 1 tablet (12.5 mg total) by mouth every 6 (six) hours as needed for nausea or vomiting. Patient not taking: Reported on 05/20/2014 05/10/14   Johnna Acosta, MD    Physical Exam: Filed Vitals:   06/06/14 1042 06/06/14 1336 06/06/14 1337 06/06/14 1415  BP: 130/70 145/73 145/73 125/75  Pulse:  102 100 96 110  Temp: 98.3 F (36.8 C)     TempSrc: Oral     Resp: 22 17 18 16   Height: 5\' 4"  (1.626 m)     Weight: 58.968 kg (130 lb)     SpO2: 100% 100% 95% 99%    Physical Exam  Constitutional: Appears well-developed and well-nourished. No distress.  HENT: Normocephalic. External right and left ear normal. Dry MM Eyes: Conjunctivae and EOM are normal. PERRLA, no scleral icterus.  Neck: Normal ROM. Neck supple. No JVD. No tracheal deviation. No thyromegaly.  CVS: RRR, S1/S2 +, no murmurs, no gallops, no carotid bruit.  Pulmonary: Effort and breath sounds normal, no stridor, rhonchi, wheezes, rales.  Abdominal: Soft. BS +,  no distension, tenderness in epigastric area, no rebound or guarding.  Musculoskeletal: Normal range of motion. No edema and no tenderness.  Lymphadenopathy: No lymphadenopathy noted, cervical, inguinal. Neuro: Alert. Normal reflexes, muscle tone coordination. No cranial nerve deficit. Skin: Skin is warm and dry. No rash noted. Not diaphoretic. No erythema. No pallor.   Psychiatric: Normal mood and affect. Behavior, judgment, thought content normal.   Labs on Admission:  Basic Metabolic Panel:  Recent Labs Lab 06/06/14 1054 06/06/14 1228  NA 140 141  K 4.6 4.4  CL 95* 104  CO2 17*  --   GLUCOSE 526* 536*  BUN 25* 29*  CREATININE 0.66 0.60  CALCIUM 11.2*  --    CBC:  Recent Labs Lab 06/06/14 1054 06/06/14 1228  WBC 14.4*  --   NEUTROABS 11.1*  --   HGB 13.8 16.7*  HCT 43.0 49.0*  MCV 72.5*  --   PLT 215  --    Cardiac Enzymes: CBG:  Recent Labs Lab 06/06/14 1212 06/06/14 1304 06/06/14 1410  GLUCAP 477* 467* 329*    EKG: Normal sinus rhythm, no ST/T wave changes  Faye Ramsay, MD  [facility] Pager [phone]  If 7PM-7AM, please contact night-coverage [email or web address] Password [email or web address] 06/06/2014, 2:28 PM

## 2014-06-06 NOTE — ED Notes (Signed)
Bed: WA07 Expected date:  Expected time:  Means of arrival:  Comments: abd pain, hyperglycemia

## 2014-06-06 NOTE — ED Notes (Addendum)
IV access attempted x2 unsuccessfully, 2nd RN to attempt

## 2014-06-06 NOTE — Progress Notes (Signed)
Utilization review completed.  

## 2014-06-06 NOTE — ED Notes (Signed)
MD and RN aware of lactic acid results of 4.52

## 2014-06-07 DIAGNOSIS — E1010 Type 1 diabetes mellitus with ketoacidosis without coma: Principal | ICD-10-CM

## 2014-06-07 LAB — GLUCOSE, CAPILLARY
GLUCOSE-CAPILLARY: 109 mg/dL — AB (ref 70–99)
GLUCOSE-CAPILLARY: 114 mg/dL — AB (ref 70–99)
GLUCOSE-CAPILLARY: 139 mg/dL — AB (ref 70–99)
GLUCOSE-CAPILLARY: 218 mg/dL — AB (ref 70–99)
Glucose-Capillary: 126 mg/dL — ABNORMAL HIGH (ref 70–99)
Glucose-Capillary: 148 mg/dL — ABNORMAL HIGH (ref 70–99)
Glucose-Capillary: 149 mg/dL — ABNORMAL HIGH (ref 70–99)
Glucose-Capillary: 172 mg/dL — ABNORMAL HIGH (ref 70–99)
Glucose-Capillary: 161 mg/dL — ABNORMAL HIGH (ref 70–99)
Glucose-Capillary: 212 mg/dL — ABNORMAL HIGH (ref 70–99)
Glucose-Capillary: 208 mg/dL — ABNORMAL HIGH (ref 70–99)

## 2014-06-07 LAB — CBC
HCT: 34.1 % — ABNORMAL LOW (ref 36.0–46.0)
HEMOGLOBIN: 10.9 g/dL — AB (ref 12.0–15.0)
MCH: 23.4 pg — AB (ref 26.0–34.0)
MCHC: 32 g/dL (ref 30.0–36.0)
MCV: 73.3 fL — AB (ref 78.0–100.0)
PLATELETS: 175 10*3/uL (ref 150–400)
RBC: 4.65 MIL/uL (ref 3.87–5.11)
RDW: 15.6 % — ABNORMAL HIGH (ref 11.5–15.5)
WBC: 15.2 10*3/uL — AB (ref 4.0–10.5)

## 2014-06-07 LAB — BASIC METABOLIC PANEL
ANION GAP: 5 (ref 5–15)
ANION GAP: 6 (ref 5–15)
BUN: 25 mg/dL — AB (ref 6–23)
BUN: 24 mg/dL — ABNORMAL HIGH (ref 6–23)
CALCIUM: 9 mg/dL (ref 8.4–10.5)
CALCIUM: 8.8 mg/dL (ref 8.4–10.5)
CHLORIDE: 114 meq/L — AB (ref 96–112)
CHLORIDE: 112 meq/L (ref 96–112)
CO2: 23 mmol/L (ref 19–32)
CO2: 23 mmol/L (ref 19–32)
CREATININE: 0.58 mg/dL (ref 0.50–1.10)
Creatinine, Ser: 0.51 mg/dL (ref 0.50–1.10)
GFR calc Af Amer: >90 mL/min (ref >90)
GFR calc Af Amer: >90 mL/min (ref >90)
GFR calc non Af Amer: >90 mL/min (ref >90)
GFR calc non Af Amer: >90 mL/min (ref >90)
GLUCOSE: 201 mg/dL — AB (ref 70–99)
Glucose, Bld: 142 mg/dL — ABNORMAL HIGH (ref 70–99)
Potassium: 4.1 mmol/L (ref 3.5–5.1)
Potassium: 4 mmol/L (ref 3.5–5.1)
Sodium: 142 mmol/L (ref 135–145)
Sodium: 141 mmol/L (ref 135–145)

## 2014-06-07 MED ORDER — INSULIN ASPART 100 UNIT/ML ~~LOC~~ SOLN
0.0000 [IU] | Freq: Three times a day (TID) | SUBCUTANEOUS | Status: DC
  Administered 2014-06-07: 5 [IU] via SUBCUTANEOUS
  Administered 2014-06-07: 3 [IU] via SUBCUTANEOUS
  Administered 2014-06-07: 5 [IU] via SUBCUTANEOUS
  Administered 2014-06-08: 11 [IU] via SUBCUTANEOUS

## 2014-06-07 MED ORDER — BOOST / RESOURCE BREEZE PO LIQD
1.0000 | Freq: Two times a day (BID) | ORAL | Status: DC
  Administered 2014-06-07 – 2014-06-08 (×2): 1 via ORAL

## 2014-06-07 MED ORDER — SODIUM CHLORIDE 0.9 % IV SOLN
INTRAVENOUS | Status: DC
  Administered 2014-06-07 – 2014-06-08 (×2): via INTRAVENOUS

## 2014-06-07 MED ORDER — INSULIN GLARGINE 100 UNIT/ML ~~LOC~~ SOLN
33.0000 [IU] | Freq: Every day | SUBCUTANEOUS | Status: DC
  Administered 2014-06-07 (×2): 33 [IU] via SUBCUTANEOUS
  Filled 2014-06-07 (×2): qty 0.33

## 2014-06-07 NOTE — Progress Notes (Signed)
INITIAL NUTRITION ASSESSMENT  DOCUMENTATION CODES Per approved criteria  -Not Applicable   INTERVENTION: Resource Breeze po BID, each supplement provides 250 kcal and 9 grams of protein  NUTRITION DIAGNOSIS: Inadequate oral intake related to nausea and vomiting as evidenced by poor po intake.   Goal: Pt to meet >/= 90% of their estimated nutrition needs   Monitor:  Weight trend, po intake, acceptance of supplements, labs  Reason for Assessment: MST  43 y.o. female  Admitting Dx: <principal problem not specified>  ASSESSMENT: 43 yo female with known history of uncontrolled DM with frequent DKA's and frequent admissions for this, also with known gastroparesis, now presented to [facility] after progressive weakness, nausea and non bloody vomiting which pt explains always results in DKA as she is unable to give her self any insulin due to weakness.  - Pt with nausea and vomiting for past 5-6 days. She reports a poor appetite. Pt believes that she may have lost some weight. She says that her usual body weight is 125 lbs. Currently does not feel like eating.  - Pt reports that she sees outpatient dietitian for diabetes management.  - Pt with no signs of fat or muscle wasting.   Labs: CBG's: 139-172 Na and K WNL BUN elevated Mg WNL  Height: Ht Readings from Last 1 Encounters:  06/06/14 5\' 4"  (1.626 m)    Weight: Wt Readings from Last 1 Encounters:  06/06/14 130 lb (58.968 kg)    Ideal Body Weight: 54.7 kg  % Ideal Body Weight: 108%  Wt Readings from Last 10 Encounters:  06/06/14 130 lb (58.968 kg)  05/31/14 133 lb (60.328 kg)  05/20/14 131 lb (59.421 kg)  05/18/14 124 lb 9.6 oz (56.518 kg)  04/26/14 126 lb (57.153 kg)  04/12/14 122 lb 5.7 oz (55.5 kg)  03/21/14 114 lb 11.2 oz (52.028 kg)  03/08/14 117 lb 1 oz (53.1 kg)  01/27/14 124 lb 12.8 oz (56.609 kg)  01/22/14 129 lb 10.1 oz (58.8 kg)    Usual Body Weight: 125 lbs  % Usual Body Weight: 104%  BMI:  Body mass  index is 22.3 kg/(m^2).  Estimated Nutritional Needs: Kcal: 1600-1800 Protein: 75-90 g Fluid: 1.8 L/day  Skin: Intact   Diet Order: Diet Carb Modified  EDUCATION NEEDS: -Education needs addressed   Intake/Output Summary (Last 24 hours) at 06/07/14 1045 Last data filed at 06/07/14 0600  Gross per 24 hour  Intake 2053.53 ml  Output      0 ml  Net 2053.53 ml    Last BM: prior to admission   Labs:   Recent Labs Lab 06/06/14 1649 06/06/14 1826 06/06/14 2255 06/07/14 0255  NA 141 144 142 141  K 6.9* 4.3 4.1 4.0  CL 104 108 114* 112  CO2 21 21 23 23   BUN 23 23 25* 24*  CREATININE 0.47* 0.53 0.51 0.58  CALCIUM 9.5 9.5 9.0 8.8  MG 2.1  --   --   --   GLUCOSE 253* 171* 142* 201*    CBG (last 3)   Recent Labs  06/07/14 0054 06/07/14 0154 06/07/14 0811  GLUCAP 139* 172* 161*    Scheduled Meds: . aspirin EC  81 mg Oral Daily  . divalproex  250 mg Oral q12n4p  . enoxaparin (LOVENOX) injection  40 mg Subcutaneous Q24H  . gabapentin  300 mg Oral TID  . insulin aspart  0-15 Units Subcutaneous TID WC  . insulin glargine  33 Units Subcutaneous QHS  . metoCLOPramide  10 mg Oral TID AC & HS  . risperiDONE  2 mg Oral QHS    Continuous Infusions: . sodium chloride 125 mL/hr at 06/07/14 0700    Past Medical History  Diagnosis Date  . Kidney stone   . Seizure disorder     started with pregnancy of first son  . Gastritis   . Bipolar 2 disorder   . Post traumatic stress disorder     "flipping out" after people close to her died  . Heart murmur   . Arthritis     r ankle-S/P surgery (2007)  . Anemia     childhood  . Depression     childhood  . Hepatitis C     biopsy in 2010-no rx-was supposed to see a hepatologist in [place].  With cirrhosis  . Neuropathy     in legs and feet and hands  . Cataracts, bilateral   . Scoliosis   . Diabetes mellitus     diagnosed in 1996-always been on insulin  . DKA (diabetic ketoacidoses)     Recurrent admissions  for DKA, medication non-complaince, poor social situation  . Diabetic neuropathy, type I diabetes mellitus     numbness bilaterally feet  . HTN (hypertension) 10/12/2013    Past Surgical History  Procedure Laterality Date  . Ankle surgery  2007  . Endometrial biopsy  06/22/2012  . Cholecystectomy  04/07/2013  . Cholecystectomy N/A 04/07/2013    Procedure: LAPAROSCOPIC CHOLECYSTECTOMY WITH INTRAOPERATIVE CHOLANGIOGRAM;  Surgeon: Adin Hector, MD;  Location: [facility];  Service: General;  Laterality: N/A;  . Cesarean section      3 times    Laurette Schimke MS, RD, LDN

## 2014-06-07 NOTE — Progress Notes (Signed)
Inpatient Diabetes Program Recommendations  AACE/ADA: New Consensus Statement on Inpatient Glycemic Control (2013)  Target Ranges:  Prepandial:   less than 140 mg/dL      Peak postprandial:   less than 180 mg/dL (1-2 hours)      Critically ill patients:  140 - 180 mg/dL    To ED with Abdominal pain, N&V, Hyperglycemia.  Patient well known to the Hospitalists and the Inpatient DM Program. Multiple ED visits, Multiple Admissions for DKA, Noncompliance with insulin, etc. Has been counseled extensively in the past regarding the importance of blood sugar control at home.  Of note, this is patient's 13th hospital admission for Abdominal pain, N&V, Hyperglycemia.  Home DM Meds: Lantus 30 units QHS  70/30 insulin- 8 units bidwc  Patient was seen by [name], NP with the [facility] on 04/26/14. No changes were made to her insulin regimen. Patient was instructed and encouraged to check her CBGs more frequently and with more regularity so her insulins can be better adjusted. Seen by RD at [facility] on 05/18/14 regarding diabetes education.  Transitioned to Lantus 33 units QHS and Novolog moderate tidwc.  Thank you. Lorenda Peck, RD, LDN, CDE Inpatient Diabetes Coordinator [phone]

## 2014-06-07 NOTE — Progress Notes (Signed)
Patient ID: Mary [name], female   DOB: 1971/01/07, 43 y.o.   MRN: [id number]  [facility] PROGRESS NOTE  ROMA BONDAR NTI:[id number] DOB: 11-Mar-1971 DOA: 06/06/2014 PCP: [name], NP  Pt is 43 yo female with known history of uncontrolled DM with frequent DKA's and frequent admissions for this, also with known gastroparesis, now presented to [facility] after progressive weakness, nausea and non bloody vomiting which pt explains always results in DKA as she is unable to give her self any insulin due to weakness. She denies any fevers, chills, no chest pain or shortness of breath. Pt explains she has abdominal cramping typical for her with gastroparesis flares. She explains she is trying to be compliant and felt as she was doing well until few days prior to this admission when nausea and vomiting started.   In ED, pt noted to be hemodynamically stable, VSS. Blood work consistent with DKA, pt started on insulin drip and TRH asked to admit to SDU for further evaluation.   Assessment and Plan: Active Problems:  DKA (diabetic ketoacidoses) - AG on admission 28 with bicarb of 17 - AG closed, pt off insulin drip - already transitioned to Lantus  - tolerating diet well  - transfer to med unit   Nausea and vomiting - secondary to gastroparesis in the setting on uncontrolled diabetes - continue Reglan - provide antiemetics as needed  Leukocytosis  - possibly secondary to DKA, no clear infectious etiology elicited - UA is unremarkable for an infection  - no indication for ABX at this time    Anemia of chronic disease, diabetes - Hg down since admission but likely dilutional from IVF pt has received - no signs of bleeding - repeat CBC in AM  Hypercalcemia - from dehydration - IVF provided and repeat BMP already indicating that Ca is now WNL  Severe PCM - in the setting of acute on chronic illness of uncontrolled DM - now NPO until gastroparesis calms down   Uncontrolled DM with  complications of gastroparesis and neuropathy - continue Reglan and Gabapentin  - continue home medical regimen with Lantus   Seizures - continue Depakote - seizure precautions   Faye Ramsay, MD  [facility] Pager [phone]  If 7PM-7AM, please contact night-coverage [email or web address] Password [facility] 06/07/2014, 11:36 AM   LOS: 1 day   HPI/Subjective: No events overnight.   Objective: Filed Vitals:   06/07/14 0352 06/07/14 0400 06/07/14 0600 06/07/14 0800  BP:  133/80 124/72 129/77  Pulse:  88 96 90  Temp: 98.8 F (37.1 C)     TempSrc: Oral     Resp:  15 16 13   Height:      Weight:      SpO2:  99% 98% 97%    Intake/Output Summary (Last 24 hours) at 06/07/14 1136 Last data filed at 06/07/14 0600  Gross per 24 hour  Intake 2053.53 ml  Output      0 ml  Net 2053.53 ml    Exam:   General:  Pt is alert, follows commands appropriately, not in acute distress  Cardiovascular: Regular rate and rhythm, S1/S2, no murmurs, no rubs, no gallops  Respiratory: Clear to auscultation bilaterally, no wheezing, no crackles, no rhonchi  Abdomen: Soft, non tender, non distended, bowel sounds present, no guarding  Extremities: No edema, pulses DP and PT palpable bilaterally  Neuro: Grossly nonfocal  Data Reviewed: Basic Metabolic Panel:  Recent Labs Lab 06/06/14 1054 06/06/14 1228 06/06/14 1649 06/06/14 1826 06/06/14 2255 06/07/14 0255  NA 140 141 141 144 142 141  K 4.6 4.4 6.9* 4.3 4.1 4.0  CL 95* 104 104 108 114* 112  CO2 17*  --  21 21 23 23   GLUCOSE 526* 536* 253* 171* 142* 201*  BUN 25* 29* 23 23 25* 24*  CREATININE 0.66 0.60 0.47* 0.53 0.51 0.58  CALCIUM 11.2*  --  9.5 9.5 9.0 8.8  MG  --   --  2.1  --   --   --    Liver Function Tests: No results for input(s): AST, ALT, ALKPHOS, BILITOT, PROT, ALBUMIN in the last 168 hours. No results for input(s): LIPASE, AMYLASE in the last 168 hours. No results for input(s): AMMONIA in the last 168 hours. CBC:  Recent  Labs Lab 06/06/14 1054 06/06/14 1228 06/07/14 0255  WBC 14.4*  --  15.2*  NEUTROABS 11.1*  --   --   HGB 13.8 16.7* 10.9*  HCT 43.0 49.0* 34.1*  MCV 72.5*  --  73.3*  PLT 215  --  175   Cardiac Enzymes: No results for input(s): CKTOTAL, CKMB, CKMBINDEX, TROPONINI in the last 168 hours. BNP: Invalid input(s): POCBNP CBG:  Recent Labs Lab 06/06/14 2249 06/06/14 2352 06/07/14 0054 06/07/14 0154 06/07/14 0811  GLUCAP 114* 126* 139* 172* 161*    Recent Results (from the past 240 hour(s))  MRSA PCR Screening     Status: None   Collection Time: 06/06/14  2:48 PM  Result Value Ref Range Status   MRSA by PCR NEGATIVE NEGATIVE Final    Comment:        The GeneXpert MRSA Assay (FDA approved for NASAL specimens only), is one component of a comprehensive MRSA colonization surveillance program. It is not intended to diagnose MRSA infection nor to guide or monitor treatment for MRSA infections.      Scheduled Meds: . aspirin EC  81 mg Oral Daily  . divalproex  250 mg Oral q12n4p  . enoxaparin (LOVENOX) injection  40 mg Subcutaneous Q24H  . feeding supplement (RESOURCE BREEZE)  1 Container Oral BID BM  . gabapentin  300 mg Oral TID  . insulin aspart  0-15 Units Subcutaneous TID WC  . insulin glargine  33 Units Subcutaneous QHS  . metoCLOPramide  10 mg Oral TID AC & HS  . risperiDONE  2 mg Oral QHS   Continuous Infusions: . sodium chloride 125 mL/hr at 06/07/14 0700

## 2014-06-08 DIAGNOSIS — R1084 Generalized abdominal pain: Secondary | ICD-10-CM | POA: Diagnosis not present

## 2014-06-08 LAB — CBC
HCT: 34.4 % — ABNORMAL LOW (ref 36.0–46.0)
Hemoglobin: 10.7 g/dL — ABNORMAL LOW (ref 12.0–15.0)
MCH: 23.1 pg — AB (ref 26.0–34.0)
MCHC: 31.1 g/dL (ref 30.0–36.0)
MCV: 74.1 fL — ABNORMAL LOW (ref 78.0–100.0)
PLATELETS: 150 10*3/uL (ref 150–400)
RBC: 4.64 MIL/uL (ref 3.87–5.11)
RDW: 15.3 % (ref 11.5–15.5)
WBC: 11.1 10*3/uL — ABNORMAL HIGH (ref 4.0–10.5)

## 2014-06-08 LAB — BASIC METABOLIC PANEL
ANION GAP: 4 — AB (ref 5–15)
BUN: 11 mg/dL (ref 6–23)
CHLORIDE: 115 meq/L — AB (ref 96–112)
CO2: 21 mmol/L (ref 19–32)
CREATININE: 0.44 mg/dL — AB (ref 0.50–1.10)
Calcium: 8.4 mg/dL (ref 8.4–10.5)
GFR calc non Af Amer: >90 mL/min (ref >90)
Glucose, Bld: 114 mg/dL — ABNORMAL HIGH (ref 70–99)
Potassium: 3.9 mmol/L (ref 3.5–5.1)
Sodium: 140 mmol/L (ref 135–145)

## 2014-06-08 LAB — GLUCOSE, CAPILLARY
GLUCOSE-CAPILLARY: 104 mg/dL — AB (ref 70–99)
Glucose-Capillary: 303 mg/dL — ABNORMAL HIGH (ref 70–99)

## 2014-06-08 MED ORDER — PROMETHAZINE HCL 12.5 MG PO TABS: 12.5000 mg | ORAL_TABLET | Freq: Four times a day (QID) | ORAL | Status: DC | PRN

## 2014-06-08 MED ORDER — ONDANSETRON HCL 4 MG PO TABS: 4.0000 mg | ORAL_TABLET | Freq: Four times a day (QID) | ORAL | Status: DC

## 2014-06-08 MED ORDER — OXYCODONE-ACETAMINOPHEN 5-325 MG PO TABS: 1.0000 | ORAL_TABLET | ORAL | Status: DC | PRN

## 2014-06-08 NOTE — Discharge Instructions (Signed)

## 2014-06-08 NOTE — Progress Notes (Signed)
DC instructions reviewed with patient. Home med rec carefully reviewed with the timing of next meds noted on the AVS for the patient's record. Patient denies further questions or concerns. IV to be removed after patient eats lunch. No changes noted since am assessment. Rx's X3 given to patient, along with a bus pass. RN had a discussion with the patient about smoking cessation. Spoke to patient about the [place] smoking cessation counseling and the benefits to her body from quitting. Patient verbalized understanding of all instructions. Patient to be DC to home after lunch.

## 2014-06-08 NOTE — Discharge Summary (Addendum)
Physician Discharge Summary  Mary [name] IEP:[id number] DOB: 1971-04-13 DOA: 06/06/2014  PCP: [name], NP  Admit date: 06/06/2014 Discharge date: 06/08/2014  Recommendations for Outpatient Follow-up:  1. Pt will need to follow up with PCP in 2-3 weeks post discharge 2. Please obtain BMP to evaluate electrolytes and kidney function 3. Please also check CBC to evaluate Hg and Hct levels  Discharge Diagnoses:  Active Problems:   DKA (diabetic ketoacidoses)    Discharge Condition: Stable  Diet recommendation: Heart healthy diet discussed in details   History of present illness:    Pt is 43 yo female with known history of uncontrolled DM with frequent DKA's and frequent admissions for this, also with known gastroparesis, now presented to [facility] after progressive weakness, nausea and non bloody vomiting which pt explains always results in DKA as she is unable to give her self any insulin due to weakness. She denies any fevers, chills, no chest pain or shortness of breath. Pt explains she has abdominal cramping typical for her with gastroparesis flares. She explains she is trying to be compliant and felt as she was doing well until few days prior to this admission when nausea and vomiting started.   In ED, pt noted to be hemodynamically stable, VSS. Blood work consistent with DKA, pt started on insulin drip and TRH asked to admit to SDU for further evaluation.   Assessment and Plan: Active Problems:  DKA (diabetic ketoacidoses) - AG on admission 28 with bicarb of 17 - AG closed, pt off insulin drip - already transitioned to Lantus  - tolerating diet well  - wants to go home today   Nausea and vomiting - secondary to gastroparesis in the setting on uncontrolled diabetes - continue Reglan - provide antiemetics as needed - tolerating regular diet well   Leukocytosis  - possibly secondary to DKA, no clear infectious etiology elicited - UA is unremarkable for an infection   - no indication for ABX at this time   Anemia of chronic disease, diabetes - Hg down since admission but likely dilutional from IVF pt has received - no signs of bleeding  Hypercalcemia - from dehydration - IVF provided and repeat BMP already indicating that Ca is now WNL  Severe PCM - in the setting of acute on chronic illness of uncontrolled DM - Inadequate oral intake related to nausea and vomiting as evidenced by poor po intake, pt NPO for 24 hours and eating < 50% of the meals  Uncontrolled DM with complications of gastroparesis and neuropathy - continue Reglan and Gabapentin  - continue home medical regimen with Lantus   Seizures - continue Depakote - seizure precautions   Procedures/Studies: Dg Abd Acute W/chest  06/06/2014   CLINICAL DATA:  Several day history of periumbilical pain with nausea and vomiting  EXAM: ACUTE ABDOMEN SERIES (ABDOMEN 2 VIEW & CHEST 1 VIEW)  COMPARISON:  May 10, 2014.  FINDINGS: PA chest: Lungs are clear. Heart size and pulmonary vascularity are normal. No adenopathy. There is lower thoracic dextroscoliosis.  There is moderate stool throughout the colon. No bowel dilatation or air-fluid level suggesting obstruction. No free air. There is upper lumbar levoscoliosis. There are phleboliths in the pelvis.  IMPRESSION: Moderate stool throughout colon. Bowel gas pattern overall unremarkable. Lungs clear.   Electronically Signed   By: Lowella Grip M.D.   On: 06/06/2014 19:18   Dg Abd Acute W/chest  05/10/2014   CLINICAL DATA:  Sharp abdominal pain for 1 day  EXAM: ACUTE  ABDOMEN SERIES (ABDOMEN 2 VIEW & CHEST 1 VIEW)  COMPARISON:  04/11/2014  FINDINGS: Cardiac shadow is within normal limits. The lungs are clear bilaterally.  The the abdomen shows no free air. Scattered large and small bowel gas is noted changes of prior cholecystectomy are seen. A scoliosis of the thoracolumbar spine is noted. No acute bony abnormality is seen.  IMPRESSION: No  acute abnormality noted.   Electronically Signed   By: Inez Catalina M.D.   On: 05/10/2014 20:35    Discharge Exam: Filed Vitals:   06/08/14 0617  BP: 140/71  Pulse: 76  Temp: 98.9 F (37.2 C)  Resp: 18   Filed Vitals:   06/07/14 1805 06/07/14 2058 06/08/14 0203 06/08/14 0617  BP: 137/74 128/63 134/74 140/71  Pulse: 71 72 75 76  Temp: 98.6 F (37 C) 99 F (37.2 C) 98.9 F (37.2 C) 98.9 F (37.2 C)  TempSrc: Oral Oral Oral Oral  Resp:  18 16 18   Height:      Weight:      SpO2: 100% 99% 97% 99%    General: Pt is alert, follows commands appropriately, not in acute distress Cardiovascular: Regular rate and rhythm, S1/S2 +, no murmurs, no rubs, no gallops Respiratory: Clear to auscultation bilaterally, no wheezing, no crackles, no rhonchi Abdominal: Soft, non tender, non distended, bowel sounds +, no guarding Extremities: no edema, no cyanosis, pulses palpable bilaterally DP and PT Neuro: Grossly nonfocal  Discharge Instructions  Discharge Instructions    Diet - low sodium heart healthy    Complete by:  As directed      Increase activity slowly    Complete by:  As directed             Medication List    TAKE these medications        aspirin EC 81 MG tablet  Take 81 mg by mouth daily.     divalproex 250 MG DR tablet  Commonly known as:  DEPAKOTE  Take 250 mg by mouth 2 times daily at 12 noon and 4 pm.     gabapentin 300 MG capsule  Commonly known as:  NEURONTIN  Take 300 mg by mouth 3 (three) times daily.     insulin glargine 100 UNIT/ML injection  Commonly known as:  LANTUS  Inject 0.33 mLs (33 Units total) into the skin every morning.     insulin NPH-regular Human (70-30) 100 UNIT/ML injection  Commonly known as:  NOVOLIN 70/30  Inject 8 Units into the skin 2 (two) times daily with a meal.     medroxyPROGESTERone 150 MG/ML injection  Commonly known as:  DEPO-PROVERA  Inject 150 mg into the muscle every 3 (three) months.     metoCLOPramide 10 MG  tablet  Commonly known as:  REGLAN  Take 1 tablet (10 mg total) by mouth 4 (four) times daily -  before meals and at bedtime.     ondansetron 4 MG tablet  Commonly known as:  ZOFRAN  Take 1 tablet (4 mg total) by mouth every 6 (six) hours.     oxyCODONE-acetaminophen 5-325 MG per tablet  Commonly known as:  PERCOCET/ROXICET  Take 1-2 tablets by mouth every 3 (three) hours as needed for moderate pain.     promethazine 12.5 MG tablet  Commonly known as:  PHENERGAN  Take 1 tablet (12.5 mg total) by mouth every 6 (six) hours as needed for nausea or vomiting.     risperiDONE 2 MG tablet  Commonly known  as:  RISPERDAL  Take 2 mg by mouth at bedtime.           Follow-up Information    Follow up with [name], NP.   Specialty:  Internal Medicine   Contact information:   [place] [address] [phone]        The results of significant diagnostics from this hospitalization (including imaging, microbiology, ancillary and laboratory) are listed below for reference.     Microbiology: Recent Results (from the past 240 hour(s))  MRSA PCR Screening     Status: None   Collection Time: 06/06/14  2:48 PM  Result Value Ref Range Status   MRSA by PCR NEGATIVE NEGATIVE Final    Comment:        The GeneXpert MRSA Assay (FDA approved for NASAL specimens only), is one component of a comprehensive MRSA colonization surveillance program. It is not intended to diagnose MRSA infection nor to guide or monitor treatment for MRSA infections.      Labs: Basic Metabolic Panel:  Recent Labs Lab 06/06/14 1649 06/06/14 1826 06/06/14 2255 06/07/14 0255 06/08/14 0535  NA 141 144 142 141 140  K 6.9* 4.3 4.1 4.0 3.9  CL 104 108 114* 112 115*  CO2 21 21 23 23 21   GLUCOSE 253* 171* 142* 201* 114*  BUN 23 23 25* 24* 11  CREATININE 0.47* 0.53 0.51 0.58 0.44*  CALCIUM 9.5 9.5 9.0 8.8 8.4  MG 2.1  --   --   --   --    Liver Function Tests: No results for  input(s): AST, ALT, ALKPHOS, BILITOT, PROT, ALBUMIN in the last 168 hours. No results for input(s): LIPASE, AMYLASE in the last 168 hours. No results for input(s): AMMONIA in the last 168 hours. CBC:  Recent Labs Lab 06/06/14 1054 06/06/14 1228 06/07/14 0255 06/08/14 0535  WBC 14.4*  --  15.2* 11.1*  NEUTROABS 11.1*  --   --   --   HGB 13.8 16.7* 10.9* 10.7*  HCT 43.0 49.0* 34.1* 34.4*  MCV 72.5*  --  73.3* 74.1*  PLT 215  --  175 150   Cardiac Enzymes: No results for input(s): CKTOTAL, CKMB, CKMBINDEX, TROPONINI in the last 168 hours. BNP: BNP (last 3 results) No results for input(s): PROBNP in the last 8760 hours. CBG:  Recent Labs Lab 06/07/14 0811 06/07/14 1221 06/07/14 1650 06/07/14 2140 06/08/14 0743  GLUCAP 161* 212* 208* 218* 104*     SIGNED: Time coordinating discharge: Over 30 minutes  MAGICK-Jaeli Grubb, MD  [facility] 06/08/2014, 9:22 AM Pager [phone]  If 7PM-7AM, please contact night-coverage [email or web address] Password [email or web address]

## 2014-06-08 NOTE — Progress Notes (Signed)
CARE MANAGEMENT NOTE 06/08/2014  Patient:  Mary [name], Mary [name]   Account Number:  [id number]  Date Initiated:  06/08/2014  Documentation initiated by:  Edwyna Shell  Subjective/Objective Assessment:   43 yo female admitted with KDA from home     Action/Plan:   discharge planning   Anticipated DC Date:  06/08/2014   Anticipated DC Plan:        [place]  CM consult      Choice offered to / List presented to:             Status of service:  Completed, signed off Medicare Important Message given?   (If response is "NO", the following Medicare IM given date fields will be blank) Date Medicare IM given:   Medicare IM given by:   Date Additional Medicare IM given:   Additional Medicare IM given by:    Discharge Disposition:  HOME/SELF CARE  Per UR Regulation:    If discussed at Long Length of Stay Meetings, dates discussed:    Comments:  06/08/14 Edwyna Shell RN BSN CM [phone] Received phone call from [place] at the [facility] to follow up on patient. Informed her of patient dc and that patient stated she has to follow up to reapply for financial assistance so she can follow up with endocrinologist appointment. Janett Billow communicated a follow up appointment for the patient at the Center for Dec 30th at 10:00 am with [name] NP and that her previous appt. with a RN on the 29th has been cancelled. Information communicated to patient and provided written follow up appointment information.  06/08/14 Edwyna Shell RN BSN CM [phone] Patient follows with [name] NP at the [facility] and is connected to resources through the [facility] and patient is encouraged to continue to follow at the [facility] for available resources, affordability of care and medications.

## 2014-06-08 NOTE — Patient Instructions (Signed)
Hospital follow-up appointment: 06/15/14 at 10:00am with [name], NP  Location: [facility] [address] [place], [place] [address]

## 2014-06-08 NOTE — Progress Notes (Signed)
Patient admitted on 06/06/14 for DKA. Patient will need to follow-up with PCP after discharge. Hospital follow-up appointment scheduled for 06/15/14 at 1000 with [name], NP. Appointment time and location placed on AVS (under patient instructions).  Seth Bake, RN Case Manager notified of appointment, and she indicates she will update patient. Per RN Case Manager, patient plans to get any needed medications at [place].

## 2014-06-08 NOTE — Progress Notes (Signed)
NT walked patient down to bus stop for DC to home. Patient denies further questions or concerns.

## 2014-06-14 ENCOUNTER — Other Ambulatory Visit: Payer: Self-pay

## 2014-06-15 ENCOUNTER — Encounter (HOSPITAL_COMMUNITY): Payer: Self-pay | Admitting: *Deleted

## 2014-06-15 ENCOUNTER — Inpatient Hospital Stay: Payer: Self-pay | Admitting: Internal Medicine

## 2014-06-15 ENCOUNTER — Emergency Department (HOSPITAL_COMMUNITY)
Admission: EM | Admit: 2014-06-15 | Discharge: 2014-06-15 | Disposition: A | Discharge status: Home / Self Care | Payer: MEDICAID | Attending: Emergency Medicine | Admitting: Emergency Medicine

## 2014-06-15 DIAGNOSIS — G40909 Epilepsy, unspecified, not intractable, without status epilepticus: Secondary | ICD-10-CM | POA: Insufficient documentation

## 2014-06-15 DIAGNOSIS — K3184 Gastroparesis: Secondary | ICD-10-CM | POA: Insufficient documentation

## 2014-06-15 DIAGNOSIS — Z79899 Other long term (current) drug therapy: Secondary | ICD-10-CM | POA: Insufficient documentation

## 2014-06-15 DIAGNOSIS — Z794 Long term (current) use of insulin: Secondary | ICD-10-CM | POA: Insufficient documentation

## 2014-06-15 DIAGNOSIS — H269 Unspecified cataract: Secondary | ICD-10-CM | POA: Insufficient documentation

## 2014-06-15 DIAGNOSIS — R011 Cardiac murmur, unspecified: Secondary | ICD-10-CM | POA: Insufficient documentation

## 2014-06-15 DIAGNOSIS — Z8619 Personal history of other infectious and parasitic diseases: Secondary | ICD-10-CM | POA: Insufficient documentation

## 2014-06-15 DIAGNOSIS — M1990 Unspecified osteoarthritis, unspecified site: Secondary | ICD-10-CM | POA: Insufficient documentation

## 2014-06-15 DIAGNOSIS — Z87442 Personal history of urinary calculi: Secondary | ICD-10-CM | POA: Insufficient documentation

## 2014-06-15 DIAGNOSIS — Z862 Personal history of diseases of the blood and blood-forming organs and certain disorders involving the immune mechanism: Secondary | ICD-10-CM | POA: Insufficient documentation

## 2014-06-15 DIAGNOSIS — E1065 Type 1 diabetes mellitus with hyperglycemia: Secondary | ICD-10-CM | POA: Insufficient documentation

## 2014-06-15 DIAGNOSIS — Z720 Tobacco use: Secondary | ICD-10-CM | POA: Insufficient documentation

## 2014-06-15 DIAGNOSIS — Z7982 Long term (current) use of aspirin: Secondary | ICD-10-CM | POA: Insufficient documentation

## 2014-06-15 DIAGNOSIS — Z880 Allergy status to penicillin: Secondary | ICD-10-CM | POA: Insufficient documentation

## 2014-06-15 DIAGNOSIS — R739 Hyperglycemia, unspecified: Secondary | ICD-10-CM

## 2014-06-15 DIAGNOSIS — G629 Polyneuropathy, unspecified: Secondary | ICD-10-CM | POA: Insufficient documentation

## 2014-06-15 DIAGNOSIS — I10 Essential (primary) hypertension: Secondary | ICD-10-CM | POA: Insufficient documentation

## 2014-06-15 DIAGNOSIS — F3181 Bipolar II disorder: Secondary | ICD-10-CM | POA: Insufficient documentation

## 2014-06-15 LAB — COMPREHENSIVE METABOLIC PANEL
ALK PHOS: 93 U/L (ref 39–117)
ALT: 38 U/L — AB (ref 0–35)
AST: 41 U/L — ABNORMAL HIGH (ref 0–37)
Albumin: 4.2 g/dL (ref 3.5–5.2)
Anion gap: 15 (ref 5–15)
BUN: 22 mg/dL (ref 6–23)
CALCIUM: 9.9 mg/dL (ref 8.4–10.5)
CO2: 24 mmol/L (ref 19–32)
Chloride: 100 mEq/L (ref 96–112)
Creatinine, Ser: 0.67 mg/dL (ref 0.50–1.10)
GLUCOSE: 382 mg/dL — AB (ref 70–99)
Potassium: 4.1 mmol/L (ref 3.5–5.1)
SODIUM: 139 mmol/L (ref 135–145)
TOTAL PROTEIN: 8.2 g/dL (ref 6.0–8.3)
Total Bilirubin: 0.8 mg/dL (ref 0.3–1.2)

## 2014-06-15 LAB — CBG MONITORING, ED
Glucose-Capillary: 368 mg/dL — ABNORMAL HIGH (ref 70–99)
Glucose-Capillary: 247 mg/dL — ABNORMAL HIGH (ref 70–99)

## 2014-06-15 LAB — CBC
HEMATOCRIT: 40.4 % (ref 36.0–46.0)
HEMOGLOBIN: 12.9 g/dL (ref 12.0–15.0)
MCH: 23.1 pg — AB (ref 26.0–34.0)
MCHC: 31.9 g/dL (ref 30.0–36.0)
MCV: 72.3 fL — ABNORMAL LOW (ref 78.0–100.0)
Platelets: 177 10*3/uL (ref 150–400)
RBC: 5.59 MIL/uL — AB (ref 3.87–5.11)
RDW: 14.9 % (ref 11.5–15.5)
WBC: 15.8 10*3/uL — ABNORMAL HIGH (ref 4.0–10.5)

## 2014-06-15 MED ORDER — HYDROMORPHONE HCL 1 MG/ML IJ SOLN
1.0000 mg | Freq: Once | INTRAMUSCULAR | Status: AC
  Administered 2014-06-15: 1 mg via INTRAVENOUS
  Filled 2014-06-15: qty 1

## 2014-06-15 MED ORDER — OXYCODONE-ACETAMINOPHEN 5-325 MG PO TABS
1.0000 | ORAL_TABLET | Freq: Once | ORAL | Status: AC
  Administered 2014-06-15: 1 via ORAL
  Filled 2014-06-15: qty 1

## 2014-06-15 MED ORDER — SODIUM CHLORIDE 0.9 % IV SOLN
1000.0000 mL | INTRAVENOUS | Status: DC
  Administered 2014-06-15: 1000 mL via INTRAVENOUS

## 2014-06-15 MED ORDER — SODIUM CHLORIDE 0.9 % IV SOLN
1000.0000 mL | Freq: Once | INTRAVENOUS | Status: AC
  Administered 2014-06-15: 1000 mL via INTRAVENOUS

## 2014-06-15 MED ORDER — LORAZEPAM 2 MG/ML IJ SOLN
1.0000 mg | Freq: Once | INTRAMUSCULAR | Status: AC
  Administered 2014-06-15: 1 mg via INTRAVENOUS
  Filled 2014-06-15: qty 1

## 2014-06-15 MED ORDER — ONDANSETRON HCL 4 MG/2ML IJ SOLN
4.0000 mg | Freq: Once | INTRAMUSCULAR | Status: AC
  Administered 2014-06-15: 4 mg via INTRAVENOUS
  Filled 2014-06-15: qty 2

## 2014-06-15 NOTE — ED Provider Notes (Signed)
CSN: [id number]     Arrival date & time 06/15/14  9518 History   First MD Initiated Contact with Patient 06/15/14 1004     Chief Complaint  Patient presents with  . Hyperglycemia     HPI Patient reports abdominal cramping as well as associated nausea and vomiting this morning.  She checked her blood sugar 400.  She was concerned she could be in DKA again thus presented to the emergency department for evaluation.  She reports generalized crampy abdominal pain.  No hematemesis.  No melena or hematochezia.  Denies fevers or chills.  No urinary complaints.  No chest pain shortness of breath.  Symptoms are mild to moderate in severity.   Past Medical History  Diagnosis Date  . Kidney stone   . Seizure disorder     started with pregnancy of first son  . Gastritis   . Bipolar 2 disorder   . Post traumatic stress disorder     "flipping out" after people close to her died  . Heart murmur   . Arthritis     r ankle-S/P surgery (2007)  . Anemia     childhood  . Depression     childhood  . Hepatitis C     biopsy in 2010-no rx-was supposed to see a hepatologist in [place].  With cirrhosis  . Neuropathy     in legs and feet and hands  . Cataracts, bilateral   . Scoliosis   . Diabetes mellitus     diagnosed in 1996-always been on insulin  . DKA (diabetic ketoacidoses)     Recurrent admissions for DKA, medication non-complaince, poor social situation  . Diabetic neuropathy, type I diabetes mellitus     numbness bilaterally feet  . HTN (hypertension) 10/12/2013   Past Surgical History  Procedure Laterality Date  . Ankle surgery  2007  . Endometrial biopsy  06/22/2012  . Cholecystectomy  04/07/2013  . Cholecystectomy N/A 04/07/2013    Procedure: LAPAROSCOPIC CHOLECYSTECTOMY WITH INTRAOPERATIVE CHOLANGIOGRAM;  Surgeon: Adin Hector, MD;  Location: [facility];  Service: General;  Laterality: N/A;  . Cesarean section      3 times   Family History  Problem Relation Age of Onset  .  Diabetes Mother     currently 54  . Fibromyalgia Mother   . Cirrhosis Father     died in 35  . Diabetes Maternal Grandmother   . Diabetes Maternal Aunt    History  Substance Use Topics  . Smoking status: Current Every Day Smoker -- 0.10 packs/day for 25 years    Types: Cigarettes  . Smokeless tobacco: Never Used  . Alcohol Use: No     Comment: Endorses hasn't been drinking   OB History    Gravida Para Term Preterm AB TAB SAB Ectopic Multiple Living   4 3 3  0 1 0 1 0 1 2     Review of Systems  All other systems reviewed and are negative.     Allergies  Penicillins  Home Medications   Prior to Admission medications   Medication Sig Start Date End Date Taking? Authorizing Provider  aspirin EC 81 MG tablet Take 81 mg by mouth daily.    Historical Provider, MD  divalproex (DEPAKOTE) 250 MG DR tablet Take 250 mg by mouth 2 times daily at 12 noon and 4 pm.    Historical Provider, MD  gabapentin (NEURONTIN) 300 MG capsule Take 300 mg by mouth 3 (three) times daily.    Historical Provider,  MD  insulin glargine (LANTUS) 100 UNIT/ML injection Inject 0.33 mLs (33 Units total) into the skin every morning. 05/31/14   Lance Bosch, NP  insulin NPH-regular Human (NOVOLIN 70/30) (70-30) 100 UNIT/ML injection Inject 8 Units into the skin 2 (two) times daily with a meal. 04/13/14   Donne Hazel, MD  medroxyPROGESTERone (DEPO-PROVERA) 150 MG/ML injection Inject 150 mg into the muscle every 3 (three) months.    Historical Provider, MD  metoCLOPramide (REGLAN) 10 MG tablet Take 1 tablet (10 mg total) by mouth 4 (four) times daily -  before meals and at bedtime. 04/13/14   Donne Hazel, MD  ondansetron (ZOFRAN) 4 MG tablet Take 1 tablet (4 mg total) by mouth every 6 (six) hours. 06/08/14   Theodis Blaze, MD  oxyCODONE-acetaminophen (PERCOCET/ROXICET) 5-325 MG per tablet Take 1-2 tablets by mouth every 3 (three) hours as needed for moderate pain. 06/08/14   Theodis Blaze, MD  promethazine  (PHENERGAN) 12.5 MG tablet Take 1 tablet (12.5 mg total) by mouth every 6 (six) hours as needed for nausea or vomiting. 06/08/14   Theodis Blaze, MD  risperiDONE (RISPERDAL) 2 MG tablet Take 2 mg by mouth at bedtime.    Historical Provider, MD   BP 128/56 mmHg  Pulse 89  Temp(Src) 98.8 F (37.1 C) (Oral)  Resp 16  SpO2 97% Physical Exam  Constitutional: She is oriented to person, place, and time. She appears well-developed and well-nourished. No distress.  HENT:  Head: Normocephalic and atraumatic.  Eyes: EOM are normal.  Neck: Normal range of motion.  Cardiovascular: Normal rate, regular rhythm and normal heart sounds.   Pulmonary/Chest: Effort normal and breath sounds normal.  Abdominal: Soft. She exhibits no distension. There is no tenderness.  Musculoskeletal: Normal range of motion.  Neurological: She is alert and oriented to person, place, and time.  Skin: Skin is warm and dry.  Psychiatric: She has a normal mood and affect. Judgment normal.  Nursing note and vitals reviewed.   ED Course  Procedures (including critical care time) Labs Review Labs Reviewed  CBC - Abnormal; Notable for the following:    WBC 15.8 (*)    RBC 5.59 (*)    MCV 72.3 (*)    MCH 23.1 (*)    All other components within normal limits  COMPREHENSIVE METABOLIC PANEL - Abnormal; Notable for the following:    Glucose, Bld 382 (*)    AST 41 (*)    ALT 38 (*)    All other components within normal limits  CBG MONITORING, ED - Abnormal; Notable for the following:    Glucose-Capillary 368 (*)    All other components within normal limits    Imaging Review No results found.   EKG Interpretation None      MDM   Final diagnoses:  Hyperglycemia  Gastroparesis    12:04 PM Patient feels much better at this time.  Abdominal exam is benign.  Vital signs are without significant abnormality.  IV fluids given.  Pain controlled.  Likely gastroparesis exacerbation.  Patient states she has Reglan and  other anti-medics at home.    Hoy Morn, MD 06/15/14 [phone]

## 2014-06-15 NOTE — ED Notes (Signed)
Bed: WA12 Expected date:  Expected time:  Means of arrival:  Comments: EMS-hyperglycemia 

## 2014-06-15 NOTE — ED Notes (Signed)
Patient was not feeling well this morning and checked her blood sugar 400. Patient was given 33 units of lantus and 13 of novolin at [facility]. Patient cbg 374 per EMS. Patient seen and treated for DKA earlier this month.

## 2014-06-15 NOTE — Discharge Instructions (Signed)

## 2014-06-28 ENCOUNTER — Other Ambulatory Visit: Payer: Self-pay

## 2014-07-04 ENCOUNTER — Other Ambulatory Visit: Payer: Self-pay

## 2014-07-08 ENCOUNTER — Ambulatory Visit: Payer: Self-pay | Admitting: *Deleted

## 2014-07-13 ENCOUNTER — Ambulatory Visit: Payer: Self-pay | Admitting: *Deleted

## 2014-07-26 ENCOUNTER — Emergency Department (HOSPITAL_COMMUNITY): Payer: Medicaid Other

## 2014-07-26 ENCOUNTER — Inpatient Hospital Stay (HOSPITAL_COMMUNITY)
Admission: EM | Admit: 2014-07-26 | Discharge: 2014-07-31 | DRG: 637 | Disposition: A | Discharge status: Home / Self Care | Payer: Medicaid Other | Attending: Internal Medicine | Admitting: Internal Medicine

## 2014-07-26 ENCOUNTER — Encounter (HOSPITAL_COMMUNITY): Payer: Self-pay | Admitting: Emergency Medicine

## 2014-07-26 DIAGNOSIS — K8020 Calculus of gallbladder without cholecystitis without obstruction: Secondary | ICD-10-CM

## 2014-07-26 DIAGNOSIS — Z91199 Patient's noncompliance with other medical treatment and regimen due to unspecified reason: Secondary | ICD-10-CM

## 2014-07-26 DIAGNOSIS — E860 Dehydration: Secondary | ICD-10-CM | POA: Diagnosis present

## 2014-07-26 DIAGNOSIS — E1310 Other specified diabetes mellitus with ketoacidosis without coma: Secondary | ICD-10-CM | POA: Diagnosis not present

## 2014-07-26 DIAGNOSIS — Z79899 Other long term (current) drug therapy: Secondary | ICD-10-CM

## 2014-07-26 DIAGNOSIS — E43 Unspecified severe protein-calorie malnutrition: Secondary | ICD-10-CM | POA: Diagnosis present

## 2014-07-26 DIAGNOSIS — E1143 Type 2 diabetes mellitus with diabetic autonomic (poly)neuropathy: Secondary | ICD-10-CM | POA: Diagnosis present

## 2014-07-26 DIAGNOSIS — R509 Fever, unspecified: Secondary | ICD-10-CM

## 2014-07-26 DIAGNOSIS — D696 Thrombocytopenia, unspecified: Secondary | ICD-10-CM | POA: Diagnosis present

## 2014-07-26 DIAGNOSIS — D759 Disease of blood and blood-forming organs, unspecified: Secondary | ICD-10-CM | POA: Diagnosis present

## 2014-07-26 DIAGNOSIS — R112 Nausea with vomiting, unspecified: Secondary | ICD-10-CM | POA: Diagnosis not present

## 2014-07-26 DIAGNOSIS — Z9119 Patient's noncompliance with other medical treatment and regimen: Secondary | ICD-10-CM | POA: Diagnosis present

## 2014-07-26 DIAGNOSIS — K219 Gastro-esophageal reflux disease without esophagitis: Secondary | ICD-10-CM | POA: Diagnosis present

## 2014-07-26 DIAGNOSIS — E1010 Type 1 diabetes mellitus with ketoacidosis without coma: Secondary | ICD-10-CM | POA: Diagnosis not present

## 2014-07-26 DIAGNOSIS — D72829 Elevated white blood cell count, unspecified: Secondary | ICD-10-CM | POA: Diagnosis present

## 2014-07-26 DIAGNOSIS — E109 Type 1 diabetes mellitus without complications: Secondary | ICD-10-CM

## 2014-07-26 DIAGNOSIS — Z681 Body mass index (BMI) 19 or less, adult: Secondary | ICD-10-CM

## 2014-07-26 DIAGNOSIS — R109 Unspecified abdominal pain: Secondary | ICD-10-CM

## 2014-07-26 DIAGNOSIS — I10 Essential (primary) hypertension: Secondary | ICD-10-CM | POA: Diagnosis present

## 2014-07-26 DIAGNOSIS — Z7982 Long term (current) use of aspirin: Secondary | ICD-10-CM | POA: Diagnosis not present

## 2014-07-26 DIAGNOSIS — Z833 Family history of diabetes mellitus: Secondary | ICD-10-CM

## 2014-07-26 DIAGNOSIS — N920 Excessive and frequent menstruation with regular cycle: Secondary | ICD-10-CM | POA: Diagnosis not present

## 2014-07-26 DIAGNOSIS — F319 Bipolar disorder, unspecified: Secondary | ICD-10-CM

## 2014-07-26 DIAGNOSIS — F17210 Nicotine dependence, cigarettes, uncomplicated: Secondary | ICD-10-CM | POA: Diagnosis present

## 2014-07-26 DIAGNOSIS — B1920 Unspecified viral hepatitis C without hepatic coma: Secondary | ICD-10-CM | POA: Diagnosis present

## 2014-07-26 DIAGNOSIS — Z794 Long term (current) use of insulin: Secondary | ICD-10-CM | POA: Diagnosis not present

## 2014-07-26 DIAGNOSIS — R000 Tachycardia, unspecified: Secondary | ICD-10-CM

## 2014-07-26 DIAGNOSIS — F3181 Bipolar II disorder: Secondary | ICD-10-CM | POA: Diagnosis present

## 2014-07-26 DIAGNOSIS — G40909 Epilepsy, unspecified, not intractable, without status epilepticus: Secondary | ICD-10-CM | POA: Diagnosis present

## 2014-07-26 DIAGNOSIS — Z9049 Acquired absence of other specified parts of digestive tract: Secondary | ICD-10-CM | POA: Diagnosis present

## 2014-07-26 DIAGNOSIS — Z9114 Patient's other noncompliance with medication regimen: Secondary | ICD-10-CM | POA: Diagnosis present

## 2014-07-26 DIAGNOSIS — K3184 Gastroparesis: Secondary | ICD-10-CM | POA: Diagnosis present

## 2014-07-26 DIAGNOSIS — E1110 Type 2 diabetes mellitus with ketoacidosis without coma: Secondary | ICD-10-CM | POA: Diagnosis present

## 2014-07-26 LAB — COMPREHENSIVE METABOLIC PANEL
ALBUMIN: 5 g/dL (ref 3.5–5.2)
ALK PHOS: 105 U/L (ref 39–117)
ALT: 63 U/L — ABNORMAL HIGH (ref 0–35)
AST: 56 U/L — ABNORMAL HIGH (ref 0–37)
Anion gap: 29 — ABNORMAL HIGH (ref 5–15)
BUN: 39 mg/dL — ABNORMAL HIGH (ref 6–23)
CHLORIDE: 104 mmol/L (ref 96–112)
CO2: 10 mmol/L — CL (ref 19–32)
CREATININE: 1.29 mg/dL — AB (ref 0.50–1.10)
Calcium: 10.6 mg/dL — ABNORMAL HIGH (ref 8.4–10.5)
GFR calc Af Amer: 57 mL/min — ABNORMAL LOW (ref >90)
GFR calc non Af Amer: 50 mL/min — ABNORMAL LOW (ref >90)
GLUCOSE: 591 mg/dL — AB (ref 70–99)
POTASSIUM: 5 mmol/L (ref 3.5–5.1)
Sodium: 143 mmol/L (ref 135–145)
TOTAL PROTEIN: 9.2 g/dL — AB (ref 6.0–8.3)
Total Bilirubin: 1.5 mg/dL — ABNORMAL HIGH (ref 0.3–1.2)

## 2014-07-26 LAB — URINALYSIS, ROUTINE W REFLEX MICROSCOPIC
Bilirubin Urine: NEGATIVE
LEUKOCYTES UA: NEGATIVE
NITRITE: NEGATIVE
PH: 5 (ref 5.0–8.0)
Protein, ur: NEGATIVE mg/dL
SPECIFIC GRAVITY, URINE: 1.026 (ref 1.005–1.030)
Urobilinogen, UA: 0.2 mg/dL (ref 0.0–1.0)

## 2014-07-26 LAB — RAPID URINE DRUG SCREEN, HOSP PERFORMED
Amphetamines: NOT DETECTED
Barbiturates: NOT DETECTED
Benzodiazepines: NOT DETECTED
COCAINE: NOT DETECTED
OPIATES: NOT DETECTED
TETRAHYDROCANNABINOL: POSITIVE — AB

## 2014-07-26 LAB — VALPROIC ACID LEVEL

## 2014-07-26 LAB — CBC
HCT: 48.5 % — ABNORMAL HIGH (ref 36.0–46.0)
Hemoglobin: 15.1 g/dL — ABNORMAL HIGH (ref 12.0–15.0)
MCH: 23.4 pg — AB (ref 26.0–34.0)
MCHC: 31.1 g/dL (ref 30.0–36.0)
MCV: 75.2 fL — AB (ref 78.0–100.0)
PLATELETS: 166 10*3/uL (ref 150–400)
RBC: 6.45 MIL/uL — ABNORMAL HIGH (ref 3.87–5.11)
RDW: 15.3 % (ref 11.5–15.5)
WBC: 20.6 10*3/uL — ABNORMAL HIGH (ref 4.0–10.5)

## 2014-07-26 LAB — CBG MONITORING, ED
Glucose-Capillary: 524 mg/dL — ABNORMAL HIGH (ref 70–99)
Glucose-Capillary: 441 mg/dL — ABNORMAL HIGH (ref 70–99)
Glucose-Capillary: 339 mg/dL — ABNORMAL HIGH (ref 70–99)

## 2014-07-26 LAB — TROPONIN I: Troponin I: <0.03 ng/mL (ref <0.031)

## 2014-07-26 LAB — GLUCOSE, CAPILLARY: GLUCOSE-CAPILLARY: 283 mg/dL — AB (ref 70–99)

## 2014-07-26 LAB — POC URINE PREG, ED: Preg Test, Ur: NEGATIVE

## 2014-07-26 LAB — I-STAT CG4 LACTIC ACID, ED: Lactic Acid, Venous: 3.51 mmol/L (ref 0.5–2.0)

## 2014-07-26 LAB — URINE MICROSCOPIC-ADD ON

## 2014-07-26 LAB — KETONES, QUALITATIVE

## 2014-07-26 MED ORDER — HEPARIN SODIUM (PORCINE) 5000 UNIT/ML IJ SOLN
5000.0000 [IU] | Freq: Three times a day (TID) | INTRAMUSCULAR | Status: DC
  Administered 2014-07-26 – 2014-07-30 (×11): 5000 [IU] via SUBCUTANEOUS
  Filled 2014-07-26 (×12): qty 1

## 2014-07-26 MED ORDER — SODIUM CHLORIDE 0.9 % IV SOLN
INTRAVENOUS | Status: DC
  Administered 2014-07-26: 3.8 [IU]/h via INTRAVENOUS
  Filled 2014-07-26: qty 2.5

## 2014-07-26 MED ORDER — METOCLOPRAMIDE HCL 10 MG PO TABS
10.0000 mg | ORAL_TABLET | Freq: Three times a day (TID) | ORAL | Status: DC
  Administered 2014-07-27 – 2014-07-31 (×17): 10 mg via ORAL
  Filled 2014-07-26 (×17): qty 1

## 2014-07-26 MED ORDER — DIVALPROEX SODIUM 250 MG PO DR TAB
250.0000 mg | DELAYED_RELEASE_TABLET | Freq: Two times a day (BID) | ORAL | Status: DC
  Administered 2014-07-27 – 2014-07-31 (×9): 250 mg via ORAL
  Filled 2014-07-26 (×11): qty 1

## 2014-07-26 MED ORDER — DEXTROSE-NACL 5-0.45 % IV SOLN
INTRAVENOUS | Status: DC
  Administered 2014-07-27 – 2014-07-28 (×2): via INTRAVENOUS

## 2014-07-26 MED ORDER — MORPHINE SULFATE 2 MG/ML IJ SOLN
2.0000 mg | INTRAMUSCULAR | Status: DC | PRN
  Administered 2014-07-26: 4 mg via INTRAVENOUS
  Administered 2014-07-26: 2 mg via INTRAVENOUS
  Administered 2014-07-27 (×2): 4 mg via INTRAVENOUS
  Filled 2014-07-26 (×3): qty 2
  Filled 2014-07-26: qty 1

## 2014-07-26 MED ORDER — PROMETHAZINE HCL 25 MG PO TABS
12.5000 mg | ORAL_TABLET | Freq: Four times a day (QID) | ORAL | Status: DC | PRN
  Administered 2014-07-27 – 2014-07-30 (×4): 12.5 mg via ORAL
  Filled 2014-07-26 (×3): qty 1

## 2014-07-26 MED ORDER — DEXTROSE-NACL 5-0.45 % IV SOLN: INTRAVENOUS | Status: DC

## 2014-07-26 MED ORDER — SODIUM CHLORIDE 0.9 % IV BOLUS (SEPSIS)
1000.0000 mL | Freq: Once | INTRAVENOUS | Status: AC
  Administered 2014-07-26: 1000 mL via INTRAVENOUS

## 2014-07-26 MED ORDER — SODIUM CHLORIDE 0.9 % IV SOLN
INTRAVENOUS | Status: DC
  Administered 2014-07-26 – 2014-07-31 (×11): via INTRAVENOUS

## 2014-07-26 MED ORDER — GABAPENTIN 300 MG PO CAPS
300.0000 mg | ORAL_CAPSULE | Freq: Three times a day (TID) | ORAL | Status: DC
  Administered 2014-07-27 – 2014-07-31 (×13): 300 mg via ORAL
  Filled 2014-07-26 (×13): qty 1

## 2014-07-26 MED ORDER — HYDROMORPHONE HCL 1 MG/ML IJ SOLN
0.5000 mg | Freq: Once | INTRAMUSCULAR | Status: AC
Start: 2014-07-26 — End: 2014-07-26
  Administered 2014-07-26: 0.5 mg via INTRAVENOUS
  Filled 2014-07-26: qty 1

## 2014-07-26 MED ORDER — RISPERIDONE 0.5 MG PO TABS
2.0000 mg | ORAL_TABLET | Freq: Every day | ORAL | Status: DC
  Administered 2014-07-27 – 2014-07-30 (×4): 2 mg via ORAL
  Filled 2014-07-26 (×2): qty 4
  Filled 2014-07-26 (×2): qty 1
  Filled 2014-07-26 (×2): qty 4

## 2014-07-26 MED ORDER — SODIUM CHLORIDE 0.9 % IV SOLN
INTRAVENOUS | Status: AC
Start: 2014-07-26 — End: 2014-07-26
  Administered 2014-07-26: 23:00:00 via INTRAVENOUS

## 2014-07-26 MED ORDER — ASPIRIN EC 81 MG PO TBEC
81.0000 mg | DELAYED_RELEASE_TABLET | Freq: Every day | ORAL | Status: DC
  Administered 2014-07-27 – 2014-07-31 (×5): 81 mg via ORAL
  Filled 2014-07-26 (×6): qty 1

## 2014-07-26 MED ORDER — INSULIN REGULAR HUMAN 100 UNIT/ML IJ SOLN
INTRAMUSCULAR | Status: AC
  Filled 2014-07-26: qty 2.5

## 2014-07-26 NOTE — ED Provider Notes (Signed)
CSN: [id number]     Arrival date & time 07/26/14  1744 History   First MD Initiated Contact with Patient 07/26/14 1754     Chief Complaint  Patient presents with  . gastroparesis   . Hyperglycemia     (Consider location/radiation/quality/duration/timing/severity/associated sxs/prior Treatment) The history is provided by the patient. No language interpreter was used.  Mary [name] is a 44 y/o F with PMHx of kidney stones, seizures, gastritis, PTSD,bipolar disorder, depression, hepatitis C, DM, scoliosis, DKA history with poor medical compliance presenting to the ED with nausea, vomiting, and abdominal pain that started yesterday morning at approximately 1:00AM. Patient reported that she has not been able to keep any food or fluids down. Stated that she has had "a lot" of episodes of emesis - NB/NB. Reported that she did not take her medications the other day or today. Denied diarrhea, melena, hematochezia, chest pain, fever, chills, sweating, travels, sick contacts, dysuria, hematuria. PCP Dr. Feliciana Rossetti  Past Medical History  Diagnosis Date  . Kidney stone   . Seizure disorder     started with pregnancy of first son  . Gastritis   . Bipolar 2 disorder   . Post traumatic stress disorder     "flipping out" after people close to her died  . Heart murmur   . Arthritis     r ankle-S/P surgery (2007)  . Anemia     childhood  . Depression     childhood  . Hepatitis C     biopsy in 2010-no rx-was supposed to see a hepatologist in [place].  With cirrhosis  . Neuropathy     in legs and feet and hands  . Cataracts, bilateral   . Scoliosis   . Diabetes mellitus     diagnosed in 1996-always been on insulin  . DKA (diabetic ketoacidoses)     Recurrent admissions for DKA, medication non-complaince, poor social situation  . Diabetic neuropathy, type I diabetes mellitus     numbness bilaterally feet  . HTN (hypertension) 10/12/2013   Past Surgical History  Procedure Laterality Date  .  Ankle surgery  2007  . Endometrial biopsy  06/22/2012  . Cholecystectomy  04/07/2013  . Cholecystectomy N/A 04/07/2013    Procedure: LAPAROSCOPIC CHOLECYSTECTOMY WITH INTRAOPERATIVE CHOLANGIOGRAM;  Surgeon: Adin Hector, MD;  Location: [facility];  Service: General;  Laterality: N/A;  . Cesarean section      3 times   Family History  Problem Relation Age of Onset  . Diabetes Mother     currently 74  . Fibromyalgia Mother   . Cirrhosis Father     died in 64  . Diabetes Maternal Grandmother   . Diabetes Maternal Aunt    History  Substance Use Topics  . Smoking status: Current Every Day Smoker -- 0.10 packs/day for 25 years    Types: Cigarettes  . Smokeless tobacco: Never Used  . Alcohol Use: No     Comment: Endorses hasn't been drinking   OB History    Gravida Para Term Preterm AB TAB SAB Ectopic Multiple Living   4 3 3  0 1 0 1 0 1 2     Review of Systems  Constitutional: Negative for fever and chills.  Eyes: Negative for visual disturbance.  Respiratory: Negative for chest tightness and shortness of breath.   Cardiovascular: Negative for chest pain.  Gastrointestinal: Positive for nausea, vomiting and abdominal pain. Negative for diarrhea, constipation, blood in stool and anal bleeding.  Musculoskeletal: Negative for back pain  and neck pain.  Neurological: Positive for headaches. Negative for dizziness and weakness.      Allergies  Penicillins  Home Medications   Prior to Admission medications   Medication Sig Start Date End Date Taking? Authorizing Provider  aspirin EC 81 MG tablet Take 81 mg by mouth daily.   Yes Historical Provider, MD  gabapentin (NEURONTIN) 300 MG capsule Take 300 mg by mouth 3 (three) times daily.   Yes Historical Provider, MD  insulin glargine (LANTUS) 100 UNIT/ML injection Inject 0.33 mLs (33 Units total) into the skin every morning. 05/31/14  Yes Lance Bosch, NP  insulin NPH-regular Human (NOVOLIN 70/30) (70-30) 100 UNIT/ML injection  Inject 8 Units into the skin 2 (two) times daily with a meal. 04/13/14  Yes Donne Hazel, MD  metoCLOPramide (REGLAN) 10 MG tablet Take 1 tablet (10 mg total) by mouth 4 (four) times daily -  before meals and at bedtime. 04/13/14  Yes Donne Hazel, MD  risperiDONE (RISPERDAL) 2 MG tablet Take 2 mg by mouth at bedtime.   Yes Historical Provider, MD  divalproex (DEPAKOTE) 250 MG DR tablet Take 250 mg by mouth 2 times daily at 12 noon and 4 pm.    Historical Provider, MD  medroxyPROGESTERone (DEPO-PROVERA) 150 MG/ML injection Inject 150 mg into the muscle every 3 (three) months.    Historical Provider, MD  ondansetron (ZOFRAN) 4 MG tablet Take 1 tablet (4 mg total) by mouth every 6 (six) hours. Patient not taking: Reported on 07/26/2014 06/08/14   Theodis Blaze, MD  oxyCODONE-acetaminophen (PERCOCET/ROXICET) 5-325 MG per tablet Take 1-2 tablets by mouth every 3 (three) hours as needed for moderate pain. Patient taking differently: Take 1-2 tablets by mouth every 6 (six) hours as needed for moderate pain.  06/08/14   Theodis Blaze, MD  promethazine (PHENERGAN) 12.5 MG tablet Take 1 tablet (12.5 mg total) by mouth every 6 (six) hours as needed for nausea or vomiting. 06/08/14   Theodis Blaze, MD   BP 125/81 mmHg  Pulse 122  Temp(Src) 97.4 F (36.3 C) (Oral)  Resp 17  SpO2 81% Physical Exam  Constitutional: She is oriented to person, place, and time. She appears well-developed and well-nourished. No distress.  HENT:  Head: Normocephalic and atraumatic.  Dry mucus membranes  Eyes: Conjunctivae and EOM are normal. Pupils are equal, round, and reactive to light. Right eye exhibits no discharge. Left eye exhibits no discharge.  Neck: Normal range of motion. Neck supple.  Cardiovascular: Regular rhythm and normal heart sounds.  Tachycardia present.  Exam reveals no friction rub.   No murmur heard. Pulses:      Radial pulses are 2+ on the right side, and 2+ on the left side.  Pulmonary/Chest:  Effort normal and breath sounds normal. No respiratory distress. She has no wheezes. She has no rales.  Abdominal: Soft. Normal appearance and bowel sounds are normal. She exhibits no distension. There is generalized tenderness. There is no rebound and no guarding.  Musculoskeletal: Normal range of motion.  Neurological: She is alert and oriented to person, place, and time. No cranial nerve deficit. She exhibits normal muscle tone. Coordination normal.  Skin: Skin is warm and dry. No rash noted. She is not diaphoretic. No erythema.  Nursing note and vitals reviewed.   ED Course  Procedures (including critical care time)  Results for orders placed or performed during the hospital encounter of 07/26/14  Comprehensive metabolic panel  Result Value Ref Range  Sodium 143 135 - 145 mmol/L   Potassium 5.0 3.5 - 5.1 mmol/L   Chloride 104 96 - 112 mmol/L   CO2 10 (LL) 19 - 32 mmol/L   Glucose, Bld 591 (HH) 70 - 99 mg/dL   BUN 39 (H) 6 - 23 mg/dL   Creatinine, Ser 1.29 (H) 0.50 - 1.10 mg/dL   Calcium 10.6 (H) 8.4 - 10.5 mg/dL   Total Protein 9.2 (H) 6.0 - 8.3 g/dL   Albumin 5.0 3.5 - 5.2 g/dL   AST 56 (H) 0 - 37 U/L   ALT 63 (H) 0 - 35 U/L   Alkaline Phosphatase 105 39 - 117 U/L   Total Bilirubin 1.5 (H) 0.3 - 1.2 mg/dL   GFR calc non Af Amer 50 (L) >90 mL/min   GFR calc Af Amer 57 (L) >90 mL/min   Anion gap 29 (H) 5 - 15  Urinalysis, Routine w reflex microscopic  Result Value Ref Range   Color, Urine YELLOW YELLOW   APPearance CLEAR CLEAR   Specific Gravity, Urine 1.026 1.005 - 1.030   pH 5.0 5.0 - 8.0   Glucose, UA >1000 (A) NEGATIVE mg/dL   Hgb urine dipstick MODERATE (A) NEGATIVE   Bilirubin Urine NEGATIVE NEGATIVE   Ketones, ur >80 (A) NEGATIVE mg/dL   Protein, ur NEGATIVE NEGATIVE mg/dL   Urobilinogen, UA 0.2 0.0 - 1.0 mg/dL   Nitrite NEGATIVE NEGATIVE   Leukocytes, UA NEGATIVE NEGATIVE  Ketones, qualitative  Result Value Ref Range   Acetone, Bld LARGE (A) NEGATIVE   Troponin I  Result Value Ref Range   Troponin I <0.03 <0.031 ng/mL  Urine rapid drug screen (hosp performed)  Result Value Ref Range   Opiates NONE DETECTED NONE DETECTED   Cocaine NONE DETECTED NONE DETECTED   Benzodiazepines NONE DETECTED NONE DETECTED   Amphetamines NONE DETECTED NONE DETECTED   Tetrahydrocannabinol POSITIVE (A) NONE DETECTED   Barbiturates NONE DETECTED NONE DETECTED  CBC  Result Value Ref Range   WBC 20.6 (H) 4.0 - 10.5 K/uL   RBC 6.45 (H) 3.87 - 5.11 MIL/uL   Hemoglobin 15.1 (H) 12.0 - 15.0 g/dL   HCT 48.5 (H) 36.0 - 46.0 %   MCV 75.2 (L) 78.0 - 100.0 fL   MCH 23.4 (L) 26.0 - 34.0 pg   MCHC 31.1 30.0 - 36.0 g/dL   RDW 15.3 11.5 - 15.5 %   Platelets 166 150 - 400 K/uL  Urine microscopic-add on  Result Value Ref Range   Squamous Epithelial / LPF RARE RARE   RBC / HPF 21-50 <3 RBC/hpf  CBG monitoring, ED  Result Value Ref Range   Glucose-Capillary 524 (H) 70 - 99 mg/dL  POC CBG, ED  Result Value Ref Range   Glucose-Capillary 441 (H) 70 - 99 mg/dL  POC urine preg, ED (not at [facility])  Result Value Ref Range   Preg Test, Ur NEGATIVE NEGATIVE  I-Stat CG4 Lactic Acid, ED  Result Value Ref Range   Lactic Acid, Venous 3.51 (HH) 0.5 - 2.0 mmol/L    Labs Review Labs Reviewed  COMPREHENSIVE METABOLIC PANEL - Abnormal; Notable for the following:    CO2 10 (*)    Glucose, Bld 591 (*)    BUN 39 (*)    Creatinine, Ser 1.29 (*)    Calcium 10.6 (*)    Total Protein 9.2 (*)    AST 56 (*)    ALT 63 (*)    Total Bilirubin 1.5 (*)  GFR calc non Af Amer 50 (*)    GFR calc Af Amer 57 (*)    Anion gap 29 (*)    All other components within normal limits  URINALYSIS, ROUTINE W REFLEX MICROSCOPIC - Abnormal; Notable for the following:    Glucose, UA >1000 (*)    Hgb urine dipstick MODERATE (*)    Ketones, ur >80 (*)    All other components within normal limits  KETONES, QUALITATIVE - Abnormal; Notable for the following:    Acetone, Bld LARGE (*)    All other  components within normal limits  URINE RAPID DRUG SCREEN (HOSP PERFORMED) - Abnormal; Notable for the following:    Tetrahydrocannabinol POSITIVE (*)    All other components within normal limits  CBC - Abnormal; Notable for the following:    WBC 20.6 (*)    RBC 6.45 (*)    Hemoglobin 15.1 (*)    HCT 48.5 (*)    MCV 75.2 (*)    MCH 23.4 (*)    All other components within normal limits  CBG MONITORING, ED - Abnormal; Notable for the following:    Glucose-Capillary 524 (*)    All other components within normal limits  CBG MONITORING, ED - Abnormal; Notable for the following:    Glucose-Capillary 441 (*)    All other components within normal limits  I-STAT CG4 LACTIC ACID, ED - Abnormal; Notable for the following:    Lactic Acid, Venous 3.51 (*)    All other components within normal limits  TROPONIN I  URINE MICROSCOPIC-ADD ON  VALPROIC ACID LEVEL  CBC WITH DIFFERENTIAL/PLATELET  BLOOD GAS, VENOUS  POC URINE PREG, ED    Imaging Review Dg Abd Acute W/chest  07/26/2014   CLINICAL DATA:  Hyperglycemia.  EXAM: ACUTE ABDOMEN SERIES (ABDOMEN 2 VIEW & CHEST 1 VIEW)  COMPARISON:  None.  FINDINGS: There is no evidence of dilated bowel loops or free intraperitoneal air. Status post cholecystectomy. Moderate levoscoliosis of thoracolumbar junction is noted. No radiopaque calculi or other significant radiographic abnormality is seen. Heart size and mediastinal contours are within normal limits. Both lungs are clear.  IMPRESSION: No evidence of bowel obstruction or ileus. No acute cardiopulmonary disease.   Electronically Signed   By: Marijo Conception, M.D.   On: 07/26/2014 20:02     EKG Interpretation   Date/Time:  Tuesday July 26 2014 18:05:00 EST Ventricular Rate:  147 PR Interval:  89 QRS Duration: 96 QT Interval:  254 QTC Calculation: 397 R Axis:   79 Text Interpretation:  Sinus tachycardia Inferior infarct, age  indeterminate Probable anterolateral infarct, recent tachycardia is  new,  no acute ST/T changes compared to 2015 Confirmed by GOLDSTON  MD, SCOTT  ([id number]) on 07/26/2014 7:21:46 PM      8:52 PM This provider spoke with Dr. Ernestina Patches, [facility]. Discussed case, labs, imaging, vitals, ED course in great detail. Patient to be admitted for DKA.   MDM   Final diagnoses:  Diabetic ketoacidosis without coma associated with other specified diabetes mellitus  Tachycardia  Medically noncompliant    Medications  dextrose 5 %-0.45 % sodium chloride infusion (not administered)  insulin regular (NOVOLIN R,HUMULIN R) 250 Units in sodium chloride 0.9 % 250 mL (1 Units/mL) infusion (3.8 Units/hr Intravenous New Bag/Given 07/26/14 2059)  sodium chloride 0.9 % bolus 1,000 mL (0 mLs Intravenous Stopped 07/26/14 1903)  HYDROmorphone (DILAUDID) injection 0.5 mg (0.5 mg Intravenous Given 07/26/14 1903)  sodium chloride 0.9 % bolus 1,000 mL (1,000  mLs Intravenous New Bag/Given 07/26/14 1932)   Filed Vitals:   07/26/14 1745 07/26/14 1800 07/26/14 1854 07/26/14 1918  BP: 151/86  151/86 125/81  Pulse:  143 143 122  Temp:   97.4 F (36.3 C)   TempSrc:   Oral   Resp:  18 18 17   SpO2:  100% 100% 81%    EKG noted sinus tachycardia with a heart rate 147 bpm no acute ST/T changes. Troponin negative elevation. CBC noted elevated leukocytosis of 20.6. Hemoglobin 15.1, hematocrit 40.5. CMP noted elevated BUN of 39, creatinine 1.29 - when compared to one month ago creatinine was 0.67, BUN 22. Elevated calcium at 10.6, protein elevated at 9.2. Bicarbonate 10. Glucose 591. Anion gap 29.0 mEq per liter. When compared to one month ago patient's GFR was greater than 90, today 57. Lactic acid elevated at 3.51. Large amount of ketones noted in blood. Urine pregnancy negative. Urine drug screen positive for cannabis. Urinalysis noted moderate hemoglobin with negative nitrites, leukocytes. Ketones greater than 80. Plain film of abdomen and chest no evidence of bowel structure ileus. No acute cardio  pulmonary disease. Valproic acid and VBG pending. Negative findings of pneumonia. Negative findings of SBO. Negative findings of UTI or pyelonephritis. Patient in DKA. IV fluids administered in the ED setting. Patient started on glucose stabilizer in ED setting. Patient has a long history of poor medical compliance and has numerous admissions regarding DKA. Patient is at risk of fatality secondary to not taking care of herself. Upon arrival to the ED, patient's heart rate was 143 bpm, after fluids patient's heart rate decreased to 112 bpm. Discussed case in great detail with [facility]. Patient to be admitted, stepdown secondary to DKA. Patient stable for transfer.   Jamse Mead, PA-C 07/26/14 2117  Ephraim Hamburger, MD 07/27/14 [phone]

## 2014-07-26 NOTE — ED Notes (Signed)
Bed: WA14 Expected date:  Expected time:  Means of arrival:  Comments: EMS-abdominal pain 

## 2014-07-26 NOTE — ED Notes (Signed)
Dr Regenia Skeeter made aware of abnormal Lactic

## 2014-07-26 NOTE — ED Notes (Signed)
PA aware of pt's request for pain medication.

## 2014-07-26 NOTE — ED Notes (Signed)
Patient transported to X-ray 

## 2014-07-26 NOTE — ED Notes (Signed)
Per EMS-abdominal pain, vomiting since yesterday morning-CBG 528-noncompliant with DM meds

## 2014-07-26 NOTE — H&P (Signed)
Hospitalist Admission History and Physical  Patient name: Mary [name] Medical record number: [id number] Date of birth: 25-Feb-1971 Age: 44 y.o. Gender: female  Primary Care Provider: [name], NP  Chief Complaint: DKA   History of Present Illness:This is a 44 y.o. year old femalemale with significant past medical history of IDDM, bipolar disorder, medical noncompliance, gastroparesis  presenting with DKA, abd pain. Pt reports worsened abd pain, decreased po intake over past 2 days. Pt not checking her sugars at home. Has not been taking insulin. 1-2 episodes of nausea and vomiting (NBNB).  Presents to ER T 97.4, HR 110s-140s, resp 10s, BP 120s-150s, satting 100% on RA. WBC 20.6, hgb 15.1, Cr 1.29, Glu 529. Bicarb 10. AG 30. Lactate 3.5. Started on DKA protocol. UDS + THC. UA negative for infection.   Assessment and Plan: Mary [name] is a 44 y.o. year old female presenting with DKA   Active Problems:   DKA (diabetic ketoacidoses)   1- DKA  -moderate to severe based on presentation  -glucose stabilizer -trend lactate -stepdown  2- Abd Pain  -acute on chronic issue in setting of above  -baseline gastroparesis  -KUB WNL  -pain control  -anti emetics   3-Leukocytosis -likely secondary to deyhdration  -UA and CXR non indicative of infection -similar presentation w/ last admission w/ no noted infectious process -follow  4-Psych  -mild agitation in setting of above -cont risperdal -prn ativan   FEN/GI: NPO  Prophylaxis: sub q heprin  Disposition: pending further evaluation  Code Status:Full Code    Patient Active Problem List   Diagnosis Date Noted  . Abdominal pain   . Hyperglycemia 04/11/2014  . DKA (diabetic ketoacidoses) 02/22/2014  . Hypokalemia 01/22/2014  . Protein-calorie malnutrition, severe 12/02/2013  . Gastroparesis due to DM 10/12/2013  . Chronic abdominal pain 10/12/2013  . GERD (gastroesophageal reflux disease) 10/12/2013  . HTN (hypertension)  10/12/2013  . Leukocytosis 03/14/2013  . Nausea with vomiting 03/14/2013  . Fibroid uterus 06/22/2012  . DM (diabetes mellitus), type 1, uncontrolled 08/18/2011  . Non compliance w medication regimen 06/05/2011  . Tobacco abuse 06/05/2011  . Bipolar 1 disorder 06/05/2011  . Microcytic anemia 04/29/2011  . Seizure disorder 04/28/2011   Past Medical History: Past Medical History  Diagnosis Date  . Kidney stone   . Seizure disorder     started with pregnancy of first son  . Gastritis   . Bipolar 2 disorder   . Post traumatic stress disorder     "flipping out" after people close to her died  . Heart murmur   . Arthritis     r ankle-S/P surgery (2007)  . Anemia     childhood  . Depression     childhood  . Hepatitis C     biopsy in 2010-no rx-was supposed to see a hepatologist in [place].  With cirrhosis  . Neuropathy     in legs and feet and hands  . Cataracts, bilateral   . Scoliosis   . Diabetes mellitus     diagnosed in 1996-always been on insulin  . DKA (diabetic ketoacidoses)     Recurrent admissions for DKA, medication non-complaince, poor social situation  . Diabetic neuropathy, type I diabetes mellitus     numbness bilaterally feet  . HTN (hypertension) 10/12/2013    Past Surgical History: Past Surgical History  Procedure Laterality Date  . Ankle surgery  2007  . Endometrial biopsy  06/22/2012  . Cholecystectomy  04/07/2013  . Cholecystectomy N/A  04/07/2013    Procedure: LAPAROSCOPIC CHOLECYSTECTOMY WITH INTRAOPERATIVE CHOLANGIOGRAM;  Surgeon: Adin Hector, MD;  Location: [facility];  Service: General;  Laterality: N/A;  . Cesarean section      3 times    Social History: History   Social History  . Marital Status: Single    Spouse Name: N/A    Number of Children: N/A  . Years of Education: N/A   Social History Main Topics  . Smoking status: Current Every Day Smoker -- 0.10 packs/day for 25 years    Types: Cigarettes  . Smokeless tobacco: Never  Used  . Alcohol Use: No     Comment: Endorses hasn't been drinking  . Drug Use: 1.00 per week    Special: Marijuana     Comment: used crack last year.  every now and then uses puffs some marijuana-only on the weekends<?>  . Sexual Activity: Yes    Birth Control/ Protection: None   Other Topics Concern  . 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 Came back to [place] from [facility]-was in rehab for 3 mo. Then [facility] to [facility] center ([facility] Currently 05/2011 endorses not doing any drugs except very occasional marijuana when she's nauseous.    Lives in [place] with BF, has grown children, 2 sons. Ambulatory.                Family History: Family History  Problem Relation Age of Onset  . Diabetes Mother     currently 21  . Fibromyalgia Mother   . Cirrhosis Father     died in 15  . Diabetes Maternal Grandmother   . Diabetes Maternal Aunt     Allergies: Allergies  Allergen Reactions  . Penicillins Rash    Current Facility-Administered Medications  Medication Dose Route Frequency Provider Last Rate Last Dose  . 0.9 %  sodium chloride infusion   Intravenous Continuous Shanda Howells, MD      . 0.9 %  sodium chloride infusion   Intravenous Continuous Shanda Howells, MD      . aspirin EC tablet 81 mg  81 mg Oral Daily Shanda Howells, MD      . dextrose 5 %-0.45 % sodium chloride infusion   Intravenous Continuous Marissa Sciacca, PA-C      . dextrose 5 %-0.45 % sodium chloride infusion   Intravenous Continuous Shanda Howells, MD      . Derrill Memo ON 07/27/2014] divalproex (DEPAKOTE) DR tablet 250 mg  250 mg Oral q12n4p Shanda Howells, MD      . gabapentin (NEURONTIN) capsule 300 mg  300 mg Oral TID Shanda Howells, MD      . heparin injection 5,000 Units  5,000 Units Subcutaneous 3 times per day Shanda Howells, MD      . insulin regular (NOVOLIN R,HUMULIN R) 250 Units in sodium chloride 0.9 % 250 mL (1  Units/mL) infusion   Intravenous Continuous Marissa Sciacca, PA-C 3.8 mL/hr at 07/26/14 2059 3.8 Units/hr at 07/26/14 2059  . insulin regular (NOVOLIN R,HUMULIN R) 250 Units in sodium chloride 0.9 % 250 mL (1 Units/mL) infusion   Intravenous Continuous Shanda Howells, MD      . metoCLOPramide (REGLAN) tablet 10 mg  10 mg Oral TID AC & HS Shanda Howells, MD      . morphine 2 MG/ML injection 2-4 mg  2-4 mg Intravenous Q3H PRN Shanda Howells, MD      . promethazine (PHENERGAN)  tablet 12.5 mg  12.5 mg Oral Q6H PRN Shanda Howells, MD      . risperiDONE (RISPERDAL) tablet 2 mg  2 mg Oral QHS Shanda Howells, MD       Current Outpatient Prescriptions  Medication Sig Dispense Refill  . aspirin EC 81 MG tablet Take 81 mg by mouth daily.    Marland Kitchen gabapentin (NEURONTIN) 300 MG capsule Take 300 mg by mouth 3 (three) times daily.    . insulin glargine (LANTUS) 100 UNIT/ML injection Inject 0.33 mLs (33 Units total) into the skin every morning. 10 mL 3  . insulin NPH-regular Human (NOVOLIN 70/30) (70-30) 100 UNIT/ML injection Inject 8 Units into the skin 2 (two) times daily with a meal. 10 mL 0  . metoCLOPramide (REGLAN) 10 MG tablet Take 1 tablet (10 mg total) by mouth 4 (four) times daily -  before meals and at bedtime. 90 tablet 0  . risperiDONE (RISPERDAL) 2 MG tablet Take 2 mg by mouth at bedtime.    . divalproex (DEPAKOTE) 250 MG DR tablet Take 250 mg by mouth 2 times daily at 12 noon and 4 pm.    . medroxyPROGESTERone (DEPO-PROVERA) 150 MG/ML injection Inject 150 mg into the muscle every 3 (three) months.    . ondansetron (ZOFRAN) 4 MG tablet Take 1 tablet (4 mg total) by mouth every 6 (six) hours. (Patient not taking: Reported on 07/26/2014) 65 tablet 0  . oxyCODONE-acetaminophen (PERCOCET/ROXICET) 5-325 MG per tablet Take 1-2 tablets by mouth every 3 (three) hours as needed for moderate pain. (Patient taking differently: Take 1-2 tablets by mouth every 6 (six) hours as needed for moderate pain. ) 45 tablet 0  .  promethazine (PHENERGAN) 12.5 MG tablet Take 1 tablet (12.5 mg total) by mouth every 6 (six) hours as needed for nausea or vomiting. 45 tablet 0   Review Of Systems: 12 point ROS negative except as noted above in HPI.  Physical Exam: Filed Vitals:   07/26/14 2143  BP: 123/85  Pulse: 150  Temp:   Resp: 16    General: cooperative and minimal to mild distress HEENT: PERRLA and extra ocular movement intact Heart: S1, S2 normal, no murmur, rub or gallop, regular rate and rhythm Lungs: clear to auscultation, no wheezes or rales and unlabored breathing Abdomen: + bowel sounds, mild generalized abd pain  Extremities: extremities normal, atraumatic, no cyanosis or edema Skin:no rashes, no ecchymoses Neurology: normal without focal findings  Labs and Imaging: Lab Results  Component Value Date/Time   NA 143 07/26/2014 06:01 PM   K 5.0 07/26/2014 06:01 PM   CL 104 07/26/2014 06:01 PM   CO2 10* 07/26/2014 06:01 PM   BUN 39* 07/26/2014 06:01 PM   CREATININE 1.29* 07/26/2014 06:01 PM   GLUCOSE 591* 07/26/2014 06:01 PM   Lab Results  Component Value Date   WBC 20.6* 07/26/2014   HGB 15.1* 07/26/2014   HCT 48.5* 07/26/2014   MCV 75.2* 07/26/2014   PLT 166 07/26/2014    Dg Abd Acute W/chest  07/26/2014   CLINICAL DATA:  Hyperglycemia.  EXAM: ACUTE ABDOMEN SERIES (ABDOMEN 2 VIEW & CHEST 1 VIEW)  COMPARISON:  None.  FINDINGS: There is no evidence of dilated bowel loops or free intraperitoneal air. Status post cholecystectomy. Moderate levoscoliosis of thoracolumbar junction is noted. No radiopaque calculi or other significant radiographic abnormality is seen. Heart size and mediastinal contours are within normal limits. Both lungs are clear.  IMPRESSION: No evidence of bowel obstruction or ileus. No acute cardiopulmonary  disease.   Electronically Signed   By: Marijo Conception, M.D.   On: 07/26/2014 20:02           Shanda Howells MD  Pager: [phone]

## 2014-07-26 NOTE — Progress Notes (Signed)
Pt screaming and moving all around in bed. Refuses to answer questions. Lucius Conn BSN, RN-BC Admissions RN  07/26/2014 9:27 PM

## 2014-07-26 NOTE — ED Notes (Signed)
PA at bedside.

## 2014-07-27 DIAGNOSIS — E43 Unspecified severe protein-calorie malnutrition: Secondary | ICD-10-CM

## 2014-07-27 DIAGNOSIS — R112 Nausea with vomiting, unspecified: Secondary | ICD-10-CM

## 2014-07-27 DIAGNOSIS — D72829 Elevated white blood cell count, unspecified: Secondary | ICD-10-CM

## 2014-07-27 DIAGNOSIS — E1010 Type 1 diabetes mellitus with ketoacidosis without coma: Secondary | ICD-10-CM

## 2014-07-27 LAB — BASIC METABOLIC PANEL
ANION GAP: 9 (ref 5–15)
Anion gap: 13 (ref 5–15)
Anion gap: 21 — ABNORMAL HIGH (ref 5–15)
Anion gap: 7 (ref 5–15)
BUN: 31 mg/dL — ABNORMAL HIGH (ref 6–23)
BUN: 28 mg/dL — ABNORMAL HIGH (ref 6–23)
BUN: 32 mg/dL — AB (ref 6–23)
BUN: 36 mg/dL — AB (ref 6–23)
CALCIUM: 10.2 mg/dL (ref 8.4–10.5)
CALCIUM: 9.2 mg/dL (ref 8.4–10.5)
CHLORIDE: 123 mmol/L — AB (ref 96–112)
CO2: 11 mmol/L — ABNORMAL LOW (ref 19–32)
CO2: 14 mmol/L — ABNORMAL LOW (ref 19–32)
CO2: 16 mmol/L — AB (ref 19–32)
CO2: 20 mmol/L (ref 19–32)
Calcium: 9.4 mg/dL (ref 8.4–10.5)
Calcium: 9.5 mg/dL (ref 8.4–10.5)
Chloride: 116 mmol/L — ABNORMAL HIGH (ref 96–112)
Chloride: 124 mmol/L — ABNORMAL HIGH (ref 96–112)
Chloride: 124 mmol/L — ABNORMAL HIGH (ref 96–112)
Creatinine, Ser: 0.58 mg/dL (ref 0.50–1.10)
Creatinine, Ser: 0.67 mg/dL (ref 0.50–1.10)
Creatinine, Ser: 0.72 mg/dL (ref 0.50–1.10)
Creatinine, Ser: 1 mg/dL (ref 0.50–1.10)
GFR calc Af Amer: 78 mL/min — ABNORMAL LOW (ref >90)
GFR calc Af Amer: >90 mL/min (ref >90)
GFR calc non Af Amer: >90 mL/min (ref >90)
GFR, EST NON AFRICAN AMERICAN: 67 mL/min — AB (ref >90)
GLUCOSE: 128 mg/dL — AB (ref 70–99)
GLUCOSE: 195 mg/dL — AB (ref 70–99)
GLUCOSE: 355 mg/dL — AB (ref 70–99)
Glucose, Bld: 158 mg/dL — ABNORMAL HIGH (ref 70–99)
POTASSIUM: 4.2 mmol/L (ref 3.5–5.1)
Potassium: 4.5 mmol/L (ref 3.5–5.1)
Potassium: 4.6 mmol/L (ref 3.5–5.1)
Potassium: 5.1 mmol/L (ref 3.5–5.1)
SODIUM: 148 mmol/L — AB (ref 135–145)
Sodium: 148 mmol/L — ABNORMAL HIGH (ref 135–145)
Sodium: 151 mmol/L — ABNORMAL HIGH (ref 135–145)
Sodium: 151 mmol/L — ABNORMAL HIGH (ref 135–145)

## 2014-07-27 LAB — GLUCOSE, CAPILLARY
GLUCOSE-CAPILLARY: 129 mg/dL — AB (ref 70–99)
GLUCOSE-CAPILLARY: 198 mg/dL — AB (ref 70–99)
GLUCOSE-CAPILLARY: 254 mg/dL — AB (ref 70–99)
Glucose-Capillary: 181 mg/dL — ABNORMAL HIGH (ref 70–99)
Glucose-Capillary: 200 mg/dL — ABNORMAL HIGH (ref 70–99)
Glucose-Capillary: 158 mg/dL — ABNORMAL HIGH (ref 70–99)
Glucose-Capillary: 154 mg/dL — ABNORMAL HIGH (ref 70–99)
Glucose-Capillary: 147 mg/dL — ABNORMAL HIGH (ref 70–99)
Glucose-Capillary: 139 mg/dL — ABNORMAL HIGH (ref 70–99)
Glucose-Capillary: 146 mg/dL — ABNORMAL HIGH (ref 70–99)
Glucose-Capillary: 201 mg/dL — ABNORMAL HIGH (ref 70–99)
Glucose-Capillary: 167 mg/dL — ABNORMAL HIGH (ref 70–99)
Glucose-Capillary: 202 mg/dL — ABNORMAL HIGH (ref 70–99)
Glucose-Capillary: 266 mg/dL — ABNORMAL HIGH (ref 70–99)

## 2014-07-27 LAB — MRSA PCR SCREENING: MRSA by PCR: NEGATIVE

## 2014-07-27 LAB — BLOOD GAS, VENOUS
Acid-base deficit: 15.5 mmol/L — ABNORMAL HIGH (ref 0.0–2.0)
Bicarbonate: 11.2 mEq/L — ABNORMAL LOW (ref 20.0–24.0)
FIO2: 0.21 %
O2 Saturation: 73.8 %
PATIENT TEMPERATURE: 98.6
PCO2 VEN: 29.4 mmHg — AB (ref 45.0–50.0)
TCO2: 10.5 mmol/L (ref 0–100)
pH, Ven: 7.205 — ABNORMAL LOW (ref 7.250–7.300)
pO2, Ven: 47.2 mmHg — ABNORMAL HIGH (ref 30.0–45.0)

## 2014-07-27 MED ORDER — ONDANSETRON HCL 4 MG/2ML IJ SOLN: 4.0000 mg | Freq: Once | INTRAMUSCULAR | Status: DC

## 2014-07-27 MED ORDER — HYDROMORPHONE HCL 1 MG/ML IJ SOLN
0.5000 mg | INTRAMUSCULAR | Status: DC | PRN
  Administered 2014-07-27 – 2014-07-31 (×19): 1 mg via INTRAVENOUS
  Filled 2014-07-27 (×19): qty 1

## 2014-07-27 MED ORDER — ONDANSETRON HCL 4 MG/2ML IJ SOLN
INTRAMUSCULAR | Status: AC
  Administered 2014-07-27: 02:00:00
  Filled 2014-07-27: qty 2

## 2014-07-27 MED ORDER — INSULIN GLARGINE 100 UNIT/ML ~~LOC~~ SOLN
25.0000 [IU] | Freq: Every day | SUBCUTANEOUS | Status: DC
  Administered 2014-07-27: 25 [IU] via SUBCUTANEOUS
  Filled 2014-07-27 (×2): qty 0.25

## 2014-07-27 MED ORDER — INSULIN ASPART 100 UNIT/ML ~~LOC~~ SOLN
0.0000 [IU] | Freq: Every day | SUBCUTANEOUS | Status: DC
  Administered 2014-07-27: 3 [IU] via SUBCUTANEOUS

## 2014-07-27 MED ORDER — INSULIN ASPART 100 UNIT/ML ~~LOC~~ SOLN
0.0000 [IU] | Freq: Three times a day (TID) | SUBCUTANEOUS | Status: DC
Start: 2014-07-27 — End: 2014-07-28
  Administered 2014-07-27: 3 [IU] via SUBCUTANEOUS
  Administered 2014-07-27 – 2014-07-28 (×2): 5 [IU] via SUBCUTANEOUS
  Administered 2014-07-28: 3 [IU] via SUBCUTANEOUS

## 2014-07-27 NOTE — Progress Notes (Addendum)
Inpatient Diabetes Program Recommendations  AACE/ADA: New Consensus Statement on Inpatient Glycemic Control (2013)  Target Ranges:  Prepandial:   less than 140 mg/dL      Peak postprandial:   less than 180 mg/dL (1-2 hours)      Critically ill patients:  140 - 180 mg/dL    Results for Mary [name], Mary [name] (MRN [id number]) as of 07/27/2014 09:29  Ref. Range 07/26/2014 18:01  Sodium Latest Range: 135-145 mmol/L 143  Potassium Latest Range: 3.5-5.1 mmol/L 5.0  Chloride Latest Range: 96-112 mmol/L 104  CO2 Latest Range: 19-32 mmol/L 10 (LL)  BUN Latest Range: 6-23 mg/dL 39 (H)  Creatinine Latest Range: 0.50-1.10 mg/dL 1.29 (H)  Calcium Latest Range: 8.4-10.5 mg/dL 10.6 (H)  GFR calc non Af Amer Latest Range: >90 mL/min 50 (L)  GFR calc Af Amer Latest Range: >90 mL/min 57 (L)  Glucose Latest Range: 70-99 mg/dL 591 (HH)  Anion gap Latest Range: 5-15  29 (H)    Admitted with DKA.  Not checking CBGs at home, Not taking insulin.  Patient well known to the Hospitalists and the Inpatient DM Program. Multiple ED visits, Multiple Admissions for DKA, Noncompliance with insulin, etc.   Has been counseled extensively in the past regarding the importance of blood sugar control at home.  Of note, this is patient's 17th hospital admission for Abdominal pain, N&V, Hyperglycemia since January of 2014! Of note, patient has also been seen in the ED 28 times since January 2014 for the same above complaints!  Home DM Meds: Lantus 33 units daily  70/30 insulin- 8 units bidwc  Patient was seen by [name], NP with the [facility] on 04/26/14. No changes were made to her insulin regimen. Patient was instructed and encouraged to check her CBGs more frequently and with more regularity so her insulins can be better adjusted. Seen by RD at [facility] on 05/18/14 regarding diabetes education.   **Note patient received 25 units Lantus at 8:49 this AM to transition off  IV insulin drip.  Patient also has orders for Novolog Sensitive SSI.   Will follow Wyn Quaker RN, MSN, CDE Diabetes Coordinator Inpatient Diabetes Program Team Pager: [phone] (8a-10p)

## 2014-07-27 NOTE — Progress Notes (Signed)
Pt c/o nausea. Called midlevel and ordered zofran 4mg  iv

## 2014-07-27 NOTE — Progress Notes (Signed)
CARE MANAGEMENT NOTE 07/27/2014  Patient:  Mary [name], Mary [name]   Account Number:  [id number]  Date Initiated:  07/27/2014  Documentation initiated by:  Okechukwu Regnier  Subjective/Objective Assessment:   significant past medical history of IDDM, bipolar disorder, medical noncompliance, gastroparesis  presenting with DKA, abd pain./ worsened abd pain, decreased po intake over past 2 days. Pt not checking her sugars at home. Has not been taki     Action/Plan:   home when stable , pt has been given multiple resources and med assistance in the past   Anticipated DC Date:  07/30/2014   Anticipated DC Plan:  HOME/SELF CARE  In-house referral  NA      DC Planning Services  CM consult      PAC Choice  NA   Choice offered to / List presented to:  NA   DME arranged  NA      DME agency  NA     [place] arranged  NA      Central agency  NA   Status of service:  In process, will continue to follow Medicare Important Message given?   (If response is "NO", the following Medicare IM given date fields will be blank) Date Medicare IM given:   Medicare IM given by:   Date Additional Medicare IM given:   Additional Medicare IM given by:    Discharge Disposition:    Per UR Regulation:  Reviewed for med. necessity/level of care/duration of stay  If discussed at [place] of Stay Meetings, dates discussed:    Comments:  07/27/2014/Ishika Chesterfield L. Rosana Hoes, RN, BSN, CCM: Chart review for medical necessity and patient discharge needs. Case Manager will follow for patient condition changes. [id number]:

## 2014-07-27 NOTE — Progress Notes (Signed)
PROGRESS NOTE  Mary [name] ZRA:[id number] DOB: May 03, 1971 DOA: 07/26/2014 PCP: [name], NP  HPI: This is a 44 y.o. year old female with significant past medical history of IDDM, bipolar disorder, medical noncompliance, gastroparesis presenting with DKA, abd pain. Pt reports worsened abd pain, decreased po intake over past 2 days. Pt not checking her sugars at home. Has not been taking insulin. 1-2 episodes of nausea and vomiting (NBNB).   Subjective / 24 H Interval events - She reports ongoing abdominal pain  Assessment/Plan: Active Problems:   Non compliance w medication regimen   Leukocytosis   Nausea with vomiting   Protein-calorie malnutrition, severe   DKA (diabetic ketoacidoses)   DKA - patient's acidosis is slowly resolving, and her gap has closed - This is likely due to medication noncompliance - Started Lantus this morning as well as sliding scale, continue to monitor, allow clear liquid diet, advance as tolerated  Nausea/vomiting/abdominal pain - these are acute on chronic in nature, she's had multiple hospitalizations and ED visits - Symptomatic treatment  Leukocytosis - likely due to #1 and #2, this is acute on chronic as well, she has no fever, urinalysis without any evidence of infection, chest x-ray clear  Protein calorie malnutrition, severe  Noncompliance - educated, however patient tells me that she ran out of her insulin  Seizure disorder - continue Depakote  Diet: Diet clear liquid Fluids: NS DVT Prophylaxis: heparin  Code Status: Full Code Family Communication: d/w patient  Disposition Plan: transfer telemetry   Consultants:  None   Procedures:  None    Antibiotics  Anti-infectives    None       Studies  Dg Abd Acute W/chest  07/26/2014   CLINICAL DATA:  Hyperglycemia.  EXAM: ACUTE ABDOMEN SERIES (ABDOMEN 2 VIEW & CHEST 1 VIEW)  COMPARISON:  None.  FINDINGS: There is no evidence of dilated bowel loops or free  intraperitoneal air. Status post cholecystectomy. Moderate levoscoliosis of thoracolumbar junction is noted. No radiopaque calculi or other significant radiographic abnormality is seen. Heart size and mediastinal contours are within normal limits. Both lungs are clear.  IMPRESSION: No evidence of bowel obstruction or ileus. No acute cardiopulmonary disease.   Electronically Signed   By: Marijo Conception, M.D.   On: 07/26/2014 20:02    Objective  Filed Vitals:   07/27/14 0300 07/27/14 0400 07/27/14 0500 07/27/14 0700  BP: 127/77  138/74 157/90  Pulse: 108  90 92  Temp:  98.3 F (36.8 C)    TempSrc:  Oral    Resp: 15  16 13   Height:      Weight:      SpO2: 100%  99% 99%    Intake/Output Summary (Last 24 hours) at 07/27/14 0739 Last data filed at 07/27/14 0600  Gross per 24 hour  Intake 903.85 ml  Output   1200 ml  Net -296.15 ml   Filed Weights   07/26/14 2300  Weight: 51 kg (112 lb 7 oz)    Exam:  General:  In mild distress due to abdominal pain  HEENT: no scleral icterus  Cardiovascular: RRR  Respiratory: CTA bil  Abdomen: diffusely tender, BS +  MSK/Extremities: no clubbing/cyanosis  Skin: no rashes  Neuro: non focal  Data Reviewed: Basic Metabolic Panel:  Recent Labs Lab 07/26/14 1801 07/26/14 2359 07/27/14 0241 07/27/14 0530  NA 143 148* 151* 148*  K 5.0 5.1 4.5 4.6  CL 104 116* 124* 123*  CO2 10* 11* 14* 16*  GLUCOSE 591* 355* 195* 158*  BUN 39* 36* 32* 31*  CREATININE 1.29* 1.00 0.72 0.67  CALCIUM 10.6* 10.2 9.4 9.2   Liver Function Tests:  Recent Labs Lab 07/26/14 1801  AST 56*  ALT 63*  ALKPHOS 105  BILITOT 1.5*  PROT 9.2*  ALBUMIN 5.0   CBC:  Recent Labs Lab 07/26/14 1829  WBC 20.6*  HGB 15.1*  HCT 48.5*  MCV 75.2*  PLT 166   Cardiac Enzymes:  Recent Labs Lab 07/26/14 1821  TROPONINI <0.03   CBG:  Recent Labs Lab 07/27/14 0303 07/27/14 0354 07/27/14 0457 07/27/14 0552 07/27/14 0647  GLUCAP 158* 154* 147*  139* 146*    Recent Results (from the past 240 hour(s))  MRSA PCR Screening     Status: None   Collection Time: 07/26/14 11:17 PM  Result Value Ref Range Status   MRSA by PCR NEGATIVE NEGATIVE Final    Comment:        The GeneXpert MRSA Assay (FDA approved for NASAL specimens only), is one component of a comprehensive MRSA colonization surveillance program. It is not intended to diagnose MRSA infection nor to guide or monitor treatment for MRSA infections.      Scheduled Meds: . aspirin EC  81 mg Oral Daily  . divalproex  250 mg Oral q12n4p  . gabapentin  300 mg Oral TID  . heparin  5,000 Units Subcutaneous 3 times per day  . insulin aspart  0-5 Units Subcutaneous QHS  . insulin aspart  0-9 Units Subcutaneous TID WC  . insulin glargine  25 Units Subcutaneous Daily  . metoCLOPramide  10 mg Oral TID AC & HS  . ondansetron  4 mg Intravenous Once  . risperiDONE  2 mg Oral QHS   Continuous Infusions: . sodium chloride Stopped (07/27/14 0012)  . dextrose 5 % and 0.45% NaCl 125 mL/hr at 07/27/14 0014  . insulin (NOVOLIN-R) infusion 2.6 Units/hr (07/27/14 0650)    Marzetta Board, MD [facility] Pager [phone]. If 7 PM - 7 AM, please contact night-coverage at [email or web address], password [facility] 07/27/2014, 7:39 AM  LOS: 1 day

## 2014-07-27 NOTE — Progress Notes (Signed)
Paged midlevel to inform about pt's bmet and that last 4 cbg's <250. Awaiting call back.

## 2014-07-28 DIAGNOSIS — Z9114 Patient's other noncompliance with medication regimen: Secondary | ICD-10-CM

## 2014-07-28 LAB — CBC
HCT: 33.8 % — ABNORMAL LOW (ref 36.0–46.0)
Hemoglobin: 10.8 g/dL — ABNORMAL LOW (ref 12.0–15.0)
MCH: 22.9 pg — ABNORMAL LOW (ref 26.0–34.0)
MCHC: 32 g/dL (ref 30.0–36.0)
MCV: 71.8 fL — AB (ref 78.0–100.0)
PLATELETS: 112 10*3/uL — AB (ref 150–400)
RBC: 4.71 MIL/uL (ref 3.87–5.11)
RDW: 15.1 % (ref 11.5–15.5)
WBC: 11.8 10*3/uL — AB (ref 4.0–10.5)

## 2014-07-28 LAB — GLUCOSE, CAPILLARY
GLUCOSE-CAPILLARY: 206 mg/dL — AB (ref 70–99)
GLUCOSE-CAPILLARY: 251 mg/dL — AB (ref 70–99)
Glucose-Capillary: 283 mg/dL — ABNORMAL HIGH (ref 70–99)
Glucose-Capillary: 237 mg/dL — ABNORMAL HIGH (ref 70–99)

## 2014-07-28 LAB — BASIC METABOLIC PANEL
Anion gap: 9 (ref 5–15)
BUN: 17 mg/dL (ref 6–23)
CO2: 20 mmol/L (ref 19–32)
CREATININE: 0.51 mg/dL (ref 0.50–1.10)
Calcium: 8.8 mg/dL (ref 8.4–10.5)
Chloride: 116 mmol/L — ABNORMAL HIGH (ref 96–112)
Glucose, Bld: 335 mg/dL — ABNORMAL HIGH (ref 70–99)
Potassium: 4 mmol/L (ref 3.5–5.1)
Sodium: 145 mmol/L (ref 135–145)

## 2014-07-28 MED ORDER — INSULIN ASPART 100 UNIT/ML ~~LOC~~ SOLN
0.0000 [IU] | Freq: Every day | SUBCUTANEOUS | Status: DC
  Administered 2014-07-29: 2 [IU] via SUBCUTANEOUS

## 2014-07-28 MED ORDER — INSULIN GLARGINE 100 UNIT/ML ~~LOC~~ SOLN
33.0000 [IU] | Freq: Every day | SUBCUTANEOUS | Status: DC
  Administered 2014-07-28 – 2014-07-31 (×4): 33 [IU] via SUBCUTANEOUS
  Filled 2014-07-28 (×4): qty 0.33

## 2014-07-28 MED ORDER — INSULIN ASPART 100 UNIT/ML ~~LOC~~ SOLN
0.0000 [IU] | Freq: Three times a day (TID) | SUBCUTANEOUS | Status: DC
  Administered 2014-07-28: 8 [IU] via SUBCUTANEOUS
  Administered 2014-07-29: 2 [IU] via SUBCUTANEOUS
  Administered 2014-07-29: 3 [IU] via SUBCUTANEOUS
  Administered 2014-07-30: 5 [IU] via SUBCUTANEOUS
  Administered 2014-07-30 (×2): 3 [IU] via SUBCUTANEOUS
  Administered 2014-07-31: 2 [IU] via SUBCUTANEOUS

## 2014-07-28 NOTE — Progress Notes (Signed)
PROGRESS NOTE  Mary [name] QMV:[id number] DOB: [birth year] DOA: 07/26/2014 PCP: [name], NP  HPI: This is a 44 y.o. year old female with significant past medical history of IDDM, bipolar disorder, medical noncompliance, gastroparesis presenting with DKA, abd pain. Pt reports worsened abd pain, decreased po intake over past 2 days. Pt not checking her sugars at home. Has not been taking insulin. 1-2 episodes of nausea and vomiting (NBNB).   Subjective / 24 H Interval events - She reports ongoing abdominal pain, feeling a bit better today   Assessment/Plan: Active Problems:   Non compliance w medication regimen   Leukocytosis   Nausea with vomiting   Protein-calorie malnutrition, severe   DKA (diabetic ketoacidoses)   DKA - patient's acidosis has resolved, gap has closed - This is likely due to medication noncompliance - increased Lantus to 33 today, increase SSI to moderate  Nausea/vomiting/abdominal pain - these are acute on chronic in nature, she's had multiple hospitalizations and ED visits - Symptomatic treatment  Leukocytosis - likely due to #1 and #2, this is acute on chronic as well, she has no fever, urinalysis without any evidence of infection, chest x-ray clear - resolved  Protein calorie malnutrition, severe  Noncompliance - patient tells me that she ran out of her insulin  Seizure disorder - continue Depakote   Diet: Diet full liquid Fluids: NS DVT Prophylaxis: heparin  Code Status: Full Code Family Communication: d/w patient  Disposition Plan: home when ready    Consultants:  None   Procedures:  None    Antibiotics  Anti-infectives    None      Studies  Dg Abd Acute W/chest  07/26/2014   CLINICAL DATA:  Hyperglycemia.  EXAM: ACUTE ABDOMEN SERIES (ABDOMEN 2 VIEW & CHEST 1 VIEW)  COMPARISON:  None.  FINDINGS: There is no evidence of dilated bowel loops or free intraperitoneal air. Status post cholecystectomy. Moderate levoscoliosis of  thoracolumbar junction is noted. No radiopaque calculi or other significant radiographic abnormality is seen. Heart size and mediastinal contours are within normal limits. Both lungs are clear.  IMPRESSION: No evidence of bowel obstruction or ileus. No acute cardiopulmonary disease.   Electronically Signed   By: Marijo Conception, M.D.   On: 07/26/2014 20:02    Objective  Filed Vitals:   07/27/14 2147 07/28/14 0131 07/28/14 0538 07/28/14 1418  BP: 143/82 139/83 161/81 143/78  Pulse: 93 100 96 80  Temp: 98.1 F (36.7 C) 98.1 F (36.7 C) 98.5 F (36.9 C) 98.7 F (37.1 C)  TempSrc: Oral Oral Oral Oral  Resp: 20 12 16 16   Height:      Weight:      SpO2: 99% 100% 100% 100%    Intake/Output Summary (Last 24 hours) at 07/28/14 1535 Last data filed at 07/28/14 1100  Gross per 24 hour  Intake   2500 ml  Output   1750 ml  Net    750 ml   Filed Weights   07/26/14 2300  Weight: 51 kg (112 lb 7 oz)    Exam:  General:  In mild distress due to abdominal pain  HEENT: no scleral icterus  Cardiovascular: RRR  Respiratory: CTA bil  Abdomen: diffusely tender, BS +  MSK/Extremities: no clubbing/cyanosis  Skin: no rashes  Neuro: non focal  Data Reviewed: Basic Metabolic Panel:  Recent Labs Lab 07/26/14 2359 07/27/14 0241 07/27/14 0530 07/27/14 0935 07/28/14 0455  NA 148* 151* 148* 151* 145  K 5.1 4.5 4.6 4.2 4.0  CL 116* 124* 123* 124* 116*  CO2 11* 14* 16* 20 20  GLUCOSE 355* 195* 158* 128* 335*  BUN 36* 32* 31* 28* 17  CREATININE 1.00 0.72 0.67 0.58 0.51  CALCIUM 10.2 9.4 9.2 9.5 8.8   Liver Function Tests:  Recent Labs Lab 07/26/14 1801  AST 56*  ALT 63*  ALKPHOS 105  BILITOT 1.5*  PROT 9.2*  ALBUMIN 5.0   CBC:  Recent Labs Lab 07/26/14 1829 07/28/14 0455  WBC 20.6* 11.8*  HGB 15.1* 10.8*  HCT 48.5* 33.8*  MCV 75.2* 71.8*  PLT 166 112*   Cardiac Enzymes:  Recent Labs Lab 07/26/14 1821  TROPONINI <0.03   CBG:  Recent Labs Lab  07/27/14 1622 07/27/14 2148 07/28/14 0753 07/28/14 1152 07/28/14 1405  GLUCAP 254* 266* 283* 206* 237*    Recent Results (from the past 240 hour(s))  MRSA PCR Screening     Status: None   Collection Time: 07/26/14 11:17 PM  Result Value Ref Range Status   MRSA by PCR NEGATIVE NEGATIVE Final    Comment:        The GeneXpert MRSA Assay (FDA approved for NASAL specimens only), is one component of a comprehensive MRSA colonization surveillance program. It is not intended to diagnose MRSA infection nor to guide or monitor treatment for MRSA infections.      Scheduled Meds: . aspirin EC  81 mg Oral Daily  . divalproex  250 mg Oral q12n4p  . gabapentin  300 mg Oral TID  . heparin  5,000 Units Subcutaneous 3 times per day  . insulin aspart  0-5 Units Subcutaneous QHS  . insulin aspart  0-9 Units Subcutaneous TID WC  . insulin glargine  33 Units Subcutaneous Daily  . metoCLOPramide  10 mg Oral TID AC & HS  . ondansetron  4 mg Intravenous Once  . risperiDONE  2 mg Oral QHS   Continuous Infusions: . sodium chloride 125 mL/hr at 07/28/14 3614    Marzetta Board, MD [facility] Pager [phone]. If 7 PM - 7 AM, please contact night-coverage at [email or web address], password [facility] 07/28/2014, 3:35 PM  LOS: 2 days

## 2014-07-28 NOTE — Progress Notes (Signed)
Inpatient Diabetes Program Recommendations  AACE/ADA: New Consensus Statement on Inpatient Glycemic Control (2013)  Target Ranges:  Prepandial:   less than 140 mg/dL      Peak postprandial:   less than 180 mg/dL (1-2 hours)      Critically ill patients:  140 - 180 mg/dL    Results for Mary [name], Mary [name] (MRN [id number]) as of 07/28/2014 11:56  Ref. Range 07/28/2014 07:53 07/28/2014 11:52  Glucose-Capillary Latest Range: 70-99 mg/dL 283 (H) 206 (H)     Current Orders: Lantus 33 units daily (increased today)      Novolog Sensitive SSI tid ac + HS    **Note Lantus increased to 33 units daily today.  Patient received 25 units Lantus total yesterday (02/10).    MD- Please consider increasing Novolog SSI to Moderate scale if patient continues to have sustained hyperglycemia    Will follow Wyn Quaker RN, MSN, CDE Diabetes Coordinator Inpatient Diabetes Program Team Pager: [phone] (8a-10p)

## 2014-07-29 ENCOUNTER — Inpatient Hospital Stay (HOSPITAL_COMMUNITY): Payer: Medicaid Other

## 2014-07-29 DIAGNOSIS — R509 Fever, unspecified: Secondary | ICD-10-CM

## 2014-07-29 LAB — URINALYSIS, ROUTINE W REFLEX MICROSCOPIC
Bilirubin Urine: NEGATIVE
Glucose, UA: 250 mg/dL — AB
Hgb urine dipstick: NEGATIVE
KETONES UR: NEGATIVE mg/dL
Leukocytes, UA: NEGATIVE
NITRITE: NEGATIVE
PH: 7 (ref 5.0–8.0)
PROTEIN: NEGATIVE mg/dL
Specific Gravity, Urine: 1.011 (ref 1.005–1.030)
UROBILINOGEN UA: 1 mg/dL (ref 0.0–1.0)

## 2014-07-29 LAB — GLUCOSE, CAPILLARY
GLUCOSE-CAPILLARY: 190 mg/dL — AB (ref 70–99)
GLUCOSE-CAPILLARY: 204 mg/dL — AB (ref 70–99)
Glucose-Capillary: 184 mg/dL — ABNORMAL HIGH (ref 70–99)
Glucose-Capillary: 145 mg/dL — ABNORMAL HIGH (ref 70–99)
Glucose-Capillary: 113 mg/dL — ABNORMAL HIGH (ref 70–99)

## 2014-07-29 MED ORDER — IOHEXOL 300 MG/ML  SOLN
100.0000 mL | Freq: Once | INTRAMUSCULAR | Status: AC | PRN
  Administered 2014-07-29: 100 mL via INTRAVENOUS

## 2014-07-29 MED ORDER — SODIUM CHLORIDE 0.9 % IV BOLUS (SEPSIS)
500.0000 mL | Freq: Once | INTRAVENOUS | Status: AC
  Administered 2014-07-29: 500 mL via INTRAVENOUS

## 2014-07-29 MED ORDER — IOHEXOL 300 MG/ML  SOLN
50.0000 mL | INTRAMUSCULAR | Status: AC
  Administered 2014-07-29: 50 mL via ORAL

## 2014-07-29 MED ORDER — IOHEXOL 300 MG/ML  SOLN: 25.0000 mL | Freq: Once | INTRAMUSCULAR | Status: AC | PRN

## 2014-07-29 MED ORDER — POLYETHYLENE GLYCOL 3350 17 G PO PACK
17.0000 g | PACK | Freq: Two times a day (BID) | ORAL | Status: DC
  Administered 2014-07-29 – 2014-07-31 (×3): 17 g via ORAL
  Filled 2014-07-29 (×5): qty 1

## 2014-07-29 MED ORDER — ACETAMINOPHEN 325 MG PO TABS
650.0000 mg | ORAL_TABLET | ORAL | Status: DC | PRN
  Administered 2014-07-29: 650 mg via ORAL
  Filled 2014-07-29: qty 2

## 2014-07-29 NOTE — Progress Notes (Signed)
Agree with previous RN's assessment of patient. Will continue to monitor 

## 2014-07-29 NOTE — Progress Notes (Signed)
PROGRESS NOTE  Mary [name] BDZ:[id number] DOB: 1971-05-11 DOA: 07/26/2014 PCP: [name], NP  HPI: This is a 44 y.o. year old female with significant past medical history of IDDM, bipolar disorder, medical noncompliance, gastroparesis presenting with DKA, abd pain. Pt reports worsened abd pain, decreased po intake over past 2 days. Pt not checking her sugars at home. Has not been taking insulin. 1-2 episodes of nausea and vomiting (NBNB).   Subjective / 24 H Interval events - She reports ongoing abdominal pain, feeling a bit better today   Assessment/Plan: Active Problems:   Non compliance w medication regimen   Leukocytosis   Nausea with vomiting   Protein-calorie malnutrition, severe   DKA (diabetic ketoacidoses)   Fever - patient with one time fever this morning, afebrile since - given severity of abdominal pain, obtain CT abdomen pelvis. - obtain UA, CXR  DKA - patient's acidosis has resolved, gap has closed - This is likely due to medication noncompliance - increased Lantus to 33 today, increase SSI to moderate  Nausea/vomiting/abdominal pain - these are acute on chronic in nature, she's had multiple hospitalizations and ED visits - Symptomatic treatment  Leukocytosis - likely due to #1 and #2, this is acute on chronic as well, she has no fever, urinalysis without any evidence of infection, chest x-ray clear - resolved  Protein calorie malnutrition, severe  Noncompliance - patient tells me that she ran out of her insulin  Seizure disorder - continue Depakote   Diet: Diet full liquid Fluids: NS DVT Prophylaxis: heparin  Code Status: Full Code Family Communication: d/w patient  Disposition Plan: home when ready    Consultants:  None   Procedures:  None    Antibiotics  Anti-infectives    None      Studies  Dg Chest 2 View  07/29/2014   CLINICAL DATA:  Fever.  EXAM: CHEST  2 VIEW  COMPARISON:  December 23, 2013.  FINDINGS: The heart size and  mediastinal contours are within normal limits. Both lungs are clear. No pneumothorax or pleural effusion is noted. The visualized skeletal structures are unremarkable.  IMPRESSION: No acute cardiopulmonary abnormality seen.   Electronically Signed   By: Marijo Conception, M.D.   On: 07/29/2014 13:09   Ct Abdomen Pelvis W Contrast  07/29/2014   CLINICAL DATA:  Fever and upper abdominal pain. History of urinary tract stones, gastritis stent hepatitis. Patient mid to the hospital with diabetic ketoacidosis.  EXAM: CT ABDOMEN AND PELVIS WITH CONTRAST  TECHNIQUE: Multidetector CT imaging of the abdomen and pelvis was performed using the standard protocol following bolus administration of intravenous contrast.  CONTRAST:  100 mL OMNIPAQUE IOHEXOL 300 MG/ML  SOLN  COMPARISON:  CT abdomen and pelvis 03/08/2014 and 06/26/2013.  FINDINGS: Mild dependent atelectasis is seen in the lung bases. No pleural or pericardial effusion.  The gallbladder has been removed. The liver, spleen, adrenal glands and kidneys are unremarkable. The pancreas appears atrophic with prominence of the pancreatic duct. Atrophy appears progressive compared to the prior exams. No focal lesion is seen.  There is a small volume of free pelvic fluid greater than seen in physiologic change. Uterus, adnexa and urinary bladder appear normal. The stomach, small and large bowel and appendix appear normal.  Convex left scoliosis is noted. No lytic or sclerotic bony lesion is identified.  IMPRESSION: Small volume of free pelvic fluid is greater than typically seen in physiologic change but nonspecific. It may be related to fluid resuscitation or less  likely enteritis. The bowel appears normal on this exam.  Atrophic appearance of the pancreas appears progressive since the prior studies.   Electronically Signed   By: Inge Rise M.D.   On: 07/29/2014 13:03    Objective  Filed Vitals:   07/29/14 0106 07/29/14 0448 07/29/14 0630 07/29/14 1441  BP: 151/79  161/84  168/83  Pulse: 78 89  79  Temp: 99.1 F (37.3 C) 101.1 F (38.4 C) 99.1 F (37.3 C) 98.2 F (36.8 C)  TempSrc: Oral Oral  Oral  Resp: 12 14  12   Height:      Weight:      SpO2: 99% 97%  100%    Intake/Output Summary (Last 24 hours) at 07/29/14 1631 Last data filed at 07/29/14 1154  Gross per 24 hour  Intake   3000 ml  Output   3700 ml  Net   -700 ml   Filed Weights   07/26/14 2300  Weight: 51 kg (112 lb 7 oz)    Exam:  General:  In mild distress due to abdominal pain  HEENT: no scleral icterus  Cardiovascular: RRR  Respiratory: CTA bil  Abdomen: diffusely tender, BS +  MSK/Extremities: no clubbing/cyanosis  Skin: no rashes  Neuro: non focal  Data Reviewed: Basic Metabolic Panel:  Recent Labs Lab 07/26/14 2359 07/27/14 0241 07/27/14 0530 07/27/14 0935 07/28/14 0455  NA 148* 151* 148* 151* 145  K 5.1 4.5 4.6 4.2 4.0  CL 116* 124* 123* 124* 116*  CO2 11* 14* 16* 20 20  GLUCOSE 355* 195* 158* 128* 335*  BUN 36* 32* 31* 28* 17  CREATININE 1.00 0.72 0.67 0.58 0.51  CALCIUM 10.2 9.4 9.2 9.5 8.8   Liver Function Tests:  Recent Labs Lab 07/26/14 1801  AST 56*  ALT 63*  ALKPHOS 105  BILITOT 1.5*  PROT 9.2*  ALBUMIN 5.0   CBC:  Recent Labs Lab 07/26/14 1829 07/28/14 0455  WBC 20.6* 11.8*  HGB 15.1* 10.8*  HCT 48.5* 33.8*  MCV 75.2* 71.8*  PLT 166 112*   Cardiac Enzymes:  Recent Labs Lab 07/26/14 1821  TROPONINI <0.03   CBG:  Recent Labs Lab 07/28/14 1405 07/28/14 1724 07/28/14 2052 07/29/14 0809 07/29/14 1201  GLUCAP 237* 251* 184* 145* 190*    Recent Results (from the past 240 hour(s))  MRSA PCR Screening     Status: None   Collection Time: 07/26/14 11:17 PM  Result Value Ref Range Status   MRSA by PCR NEGATIVE NEGATIVE Final    Comment:        The GeneXpert MRSA Assay (FDA approved for NASAL specimens only), is one component of a comprehensive MRSA colonization surveillance program. It is not intended  to diagnose MRSA infection nor to guide or monitor treatment for MRSA infections.      Scheduled Meds: . aspirin EC  81 mg Oral Daily  . divalproex  250 mg Oral q12n4p  . gabapentin  300 mg Oral TID  . heparin  5,000 Units Subcutaneous 3 times per day  . insulin aspart  0-15 Units Subcutaneous TID WC  . insulin aspart  0-5 Units Subcutaneous QHS  . insulin glargine  33 Units Subcutaneous Daily  . metoCLOPramide  10 mg Oral TID AC & HS  . ondansetron  4 mg Intravenous Once  . polyethylene glycol  17 g Oral BID  . risperiDONE  2 mg Oral QHS   Continuous Infusions: . sodium chloride 125 mL/hr at 07/29/14 0950    Sarajane Fambrough  Cruzita Lederer, MD [facility] Pager [phone]. If 7 PM - 7 AM, please contact night-coverage at [email or web address], password [facility] 07/29/2014, 4:31 PM  LOS: 3 days

## 2014-07-29 NOTE — Progress Notes (Signed)
Patient with fever of 101.1 this am.  Notified NP on call and new orders received.  Will continue to monitor patient.

## 2014-07-30 LAB — CBC
HCT: 34 % — ABNORMAL LOW (ref 36.0–46.0)
Hemoglobin: 10.7 g/dL — ABNORMAL LOW (ref 12.0–15.0)
MCH: 22.8 pg — ABNORMAL LOW (ref 26.0–34.0)
MCHC: 31.5 g/dL (ref 30.0–36.0)
MCV: 72.5 fL — ABNORMAL LOW (ref 78.0–100.0)
Platelets: 96 10*3/uL — ABNORMAL LOW (ref 150–400)
RBC: 4.69 MIL/uL (ref 3.87–5.11)
RDW: 14.2 % (ref 11.5–15.5)
WBC: 5.9 10*3/uL (ref 4.0–10.5)

## 2014-07-30 LAB — HEPATIC FUNCTION PANEL
ALT: 88 U/L — ABNORMAL HIGH (ref 0–35)
AST: 116 U/L — ABNORMAL HIGH (ref 0–37)
Albumin: 3.1 g/dL — ABNORMAL LOW (ref 3.5–5.2)
Alkaline Phosphatase: 55 U/L (ref 39–117)
BILIRUBIN INDIRECT: 0.5 mg/dL (ref 0.3–0.9)
BILIRUBIN TOTAL: 0.6 mg/dL (ref 0.3–1.2)
Bilirubin, Direct: 0.1 mg/dL (ref 0.0–0.5)
Total Protein: 5.8 g/dL — ABNORMAL LOW (ref 6.0–8.3)

## 2014-07-30 LAB — BASIC METABOLIC PANEL
ANION GAP: 7 (ref 5–15)
CO2: 30 mmol/L (ref 19–32)
Calcium: 8.8 mg/dL (ref 8.4–10.5)
Chloride: 107 mmol/L (ref 96–112)
Creatinine, Ser: 0.36 mg/dL — ABNORMAL LOW (ref 0.50–1.10)
GFR calc Af Amer: >90 mL/min (ref >90)
Glucose, Bld: 185 mg/dL — ABNORMAL HIGH (ref 70–99)
POTASSIUM: 2.7 mmol/L — AB (ref 3.5–5.1)
SODIUM: 144 mmol/L (ref 135–145)

## 2014-07-30 LAB — GLUCOSE, CAPILLARY
GLUCOSE-CAPILLARY: 117 mg/dL — AB (ref 70–99)
Glucose-Capillary: 154 mg/dL — ABNORMAL HIGH (ref 70–99)
Glucose-Capillary: 232 mg/dL — ABNORMAL HIGH (ref 70–99)
Glucose-Capillary: 200 mg/dL — ABNORMAL HIGH (ref 70–99)

## 2014-07-30 MED ORDER — POTASSIUM CHLORIDE CRYS ER 20 MEQ PO TBCR
30.0000 meq | EXTENDED_RELEASE_TABLET | ORAL | Status: AC
  Administered 2014-07-30 (×2): 30 meq via ORAL
  Filled 2014-07-30 (×4): qty 1

## 2014-07-30 MED ORDER — AZITHROMYCIN 250 MG PO TABS
250.0000 mg | ORAL_TABLET | Freq: Every day | ORAL | Status: DC
  Administered 2014-07-31: 250 mg via ORAL
  Filled 2014-07-30: qty 1

## 2014-07-30 MED ORDER — POTASSIUM CHLORIDE CRYS ER 20 MEQ PO TBCR
40.0000 meq | EXTENDED_RELEASE_TABLET | Freq: Once | ORAL | Status: AC
  Administered 2014-07-30: 40 meq via ORAL
  Filled 2014-07-30: qty 2

## 2014-07-30 MED ORDER — AZITHROMYCIN 500 MG PO TABS
500.0000 mg | ORAL_TABLET | Freq: Every day | ORAL | Status: AC
  Administered 2014-07-30: 500 mg via ORAL
  Filled 2014-07-30: qty 1

## 2014-07-30 NOTE — Progress Notes (Signed)
CRITICAL VALUE ALERT  Critical value received:  K+ 2.7  Date of notification:  07/30/2014  Time of notification:  06:50  Critical value read back:Yes.    Nurse who received alert:  Ruben Im, RN  MD notified (1st page):  Jonette Eva, NP  Time of first page:  06:50  MD notified (2nd page):  Time of second page:  Responding MD:  M. Donnal Debar, NP  Time MD responded:  06:55

## 2014-07-30 NOTE — Progress Notes (Signed)
PROGRESS NOTE  Mary [name]:[id number] DOB: [birth year] DOA: 07/26/2014 PCP: [name], NP  HPI: This is a 44 y.o. year old female with significant past medical history of IDDM, bipolar disorder, medical noncompliance, gastroparesis presenting with DKA, abd pain. Pt reports worsened abd pain, decreased po intake over past 2 days. Pt not checking her sugars at home. Has not been taking insulin. 1-2 episodes of nausea and vomiting (NBNB).   Subjective / 24 H Interval events - Denies any chest pain, nausea is improved as well as her abdominal pain  Assessment/Plan: Active Problems:   Non compliance w medication regimen   Leukocytosis   Nausea with vomiting   Protein-calorie malnutrition, severe   DKA (diabetic ketoacidoses)   Fever - patient with one time fever 2/12, abdominal CT was negative for any infectious source, patient with a cough that has been progressively worsening, she appreciates more sputum, chest x-ray was negative, may be consistent with bronchitis, will start the Z-Pak  DKA - patient's acidosis has resolved, gap has closed - This is likely due to medication noncompliance - Continue Lantus 33, SSI moderate  Nausea/vomiting/abdominal pain - these are acute on chronic in nature, she's had multiple hospitalizations and ED visits - Symptomatic treatment - Improving, we'll advance her diet  Thrombocytopenia - she's been having a progressive decline, she has a history of chronic trauma cytopenia likely in the setting of liver disease due to her HCV. I discontinued heparin given platelets less than 100, continue to monitor  Leukocytosis - likely due to #1 and #2, this is acute on chronic as well, she has no fever, urinalysis without any evidence of infection, chest x-ray clear - resolved  Protein calorie malnutrition, severe  Noncompliance - patient tells me that she ran out of her insulin  Seizure disorder - continue Depakote   Diet: Diet full  liquid Fluids: NS DVT Prophylaxis: SCD  Code Status: Full Code Family Communication: d/w patient  Disposition Plan: home when ready    Consultants:  None   Procedures:  None    Antibiotics  Anti-infectives    Start     Dose/Rate Route Frequency Ordered Stop   07/31/14 1000  azithromycin (ZITHROMAX) tablet 250 mg     250 mg Oral Daily 07/30/14 1057 08/04/14 0959   07/30/14 1200  azithromycin (ZITHROMAX) tablet 500 mg     500 mg Oral Daily 07/30/14 1057 07/30/14 1218      Studies  Dg Chest 2 View  07/29/2014   CLINICAL DATA:  Fever.  EXAM: CHEST  2 VIEW  COMPARISON:  December 23, 2013.  FINDINGS: The heart size and mediastinal contours are within normal limits. Both lungs are clear. No pneumothorax or pleural effusion is noted. The visualized skeletal structures are unremarkable.  IMPRESSION: No acute cardiopulmonary abnormality seen.   Electronically Signed   By: Marijo Conception, M.D.   On: 07/29/2014 13:09   Ct Abdomen Pelvis W Contrast  07/29/2014   CLINICAL DATA:  Fever and upper abdominal pain. History of urinary tract stones, gastritis stent hepatitis. Patient mid to the hospital with diabetic ketoacidosis.  EXAM: CT ABDOMEN AND PELVIS WITH CONTRAST  TECHNIQUE: Multidetector CT imaging of the abdomen and pelvis was performed using the standard protocol following bolus administration of intravenous contrast.  CONTRAST:  100 mL OMNIPAQUE IOHEXOL 300 MG/ML  SOLN  COMPARISON:  CT abdomen and pelvis 03/08/2014 and 06/26/2013.  FINDINGS: Mild dependent atelectasis is seen in the lung bases. No pleural or pericardial  effusion.  The gallbladder has been removed. The liver, spleen, adrenal glands and kidneys are unremarkable. The pancreas appears atrophic with prominence of the pancreatic duct. Atrophy appears progressive compared to the prior exams. No focal lesion is seen.  There is a small volume of free pelvic fluid greater than seen in physiologic change. Uterus, adnexa and urinary  bladder appear normal. The stomach, small and large bowel and appendix appear normal.  Convex left scoliosis is noted. No lytic or sclerotic bony lesion is identified.  IMPRESSION: Small volume of free pelvic fluid is greater than typically seen in physiologic change but nonspecific. It may be related to fluid resuscitation or less likely enteritis. The bowel appears normal on this exam.  Atrophic appearance of the pancreas appears progressive since the prior studies.   Electronically Signed   By: Inge Rise M.D.   On: 07/29/2014 13:03    Objective  Filed Vitals:   07/26/14 2300 07/30/14 0635 07/30/14 0952 07/30/14 1405  BP:  144/80 147/82 147/86  Pulse:  73 76 81  Temp:  98.7 F (37.1 C) 98.6 F (37 C) 98.4 F (36.9 C)  TempSrc:  Oral Oral Oral  Resp:  16 14 16   Height: 5\' 4"  (1.626 m)     Weight: 51 kg (112 lb 7 oz)     SpO2:  98% 96% 98%    Intake/Output Summary (Last 24 hours) at 07/30/14 1514 Last data filed at 07/30/14 1215  Gross per 24 hour  Intake 2242.08 ml  Output   4750 ml  Net -2507.92 ml   Filed Weights   07/26/14 2300  Weight: 51 kg (112 lb 7 oz)    Exam:  General:  No apparent distress  HEENT: no scleral icterus  Cardiovascular: RRR  Respiratory: CTA bil  Abdomen: diffusely tender, BS +  MSK/Extremities: no clubbing/cyanosis  Skin: no rashes  Neuro: non focal  Data Reviewed: Basic Metabolic Panel:  Recent Labs Lab 07/27/14 0241 07/27/14 0530 07/27/14 0935 07/28/14 0455 07/30/14 0500  NA 151* 148* 151* 145 144  K 4.5 4.6 4.2 4.0 2.7*  CL 124* 123* 124* 116* 107  CO2 14* 16* 20 20 30   GLUCOSE 195* 158* 128* 335* 185*  BUN 32* 31* 28* 17 <5*  CREATININE 0.72 0.67 0.58 0.51 0.36*  CALCIUM 9.4 9.2 9.5 8.8 8.8   Liver Function Tests:  Recent Labs Lab 07/26/14 1801  AST 56*  ALT 63*  ALKPHOS 105  BILITOT 1.5*  PROT 9.2*  ALBUMIN 5.0   CBC:  Recent Labs Lab 07/26/14 1829 07/28/14 0455 07/30/14 0500  WBC 20.6* 11.8*  5.9  HGB 15.1* 10.8* 10.7*  HCT 48.5* 33.8* 34.0*  MCV 75.2* 71.8* 72.5*  PLT 166 112* 96*   Cardiac Enzymes:  Recent Labs Lab 07/26/14 1821  TROPONINI <0.03   CBG:  Recent Labs Lab 07/29/14 1201 07/29/14 1710 07/29/14 2106 07/30/14 0756 07/30/14 1207  GLUCAP 190* 113* 204* 154* 232*    Recent Results (from the past 240 hour(s))  MRSA PCR Screening     Status: None   Collection Time: 07/26/14 11:17 PM  Result Value Ref Range Status   MRSA by PCR NEGATIVE NEGATIVE Final    Comment:        The GeneXpert MRSA Assay (FDA approved for NASAL specimens only), is one component of a comprehensive MRSA colonization surveillance program. It is not intended to diagnose MRSA infection nor to guide or monitor treatment for MRSA infections.  Scheduled Meds: . aspirin EC  81 mg Oral Daily  . [START ON 07/31/2014] azithromycin  250 mg Oral Daily  . divalproex  250 mg Oral q12n4p  . gabapentin  300 mg Oral TID  . insulin aspart  0-15 Units Subcutaneous TID WC  . insulin aspart  0-5 Units Subcutaneous QHS  . insulin glargine  33 Units Subcutaneous Daily  . metoCLOPramide  10 mg Oral TID AC & HS  . ondansetron  4 mg Intravenous Once  . polyethylene glycol  17 g Oral BID  . risperiDONE  2 mg Oral QHS   Continuous Infusions: . sodium chloride 125 mL/hr at 07/30/14 [place], MD [facility] Pager [phone]. If 7 PM - 7 AM, please contact night-coverage at [email or web address], password [email or web address] 07/30/2014, 3:14 PM  LOS: 4 days

## 2014-07-31 LAB — BASIC METABOLIC PANEL
ANION GAP: 8 (ref 5–15)
BUN: <5 mg/dL — ABNORMAL LOW (ref 6–23)
CHLORIDE: 109 mmol/L (ref 96–112)
CO2: 28 mmol/L (ref 19–32)
CREATININE: 0.42 mg/dL — AB (ref 0.50–1.10)
Calcium: 9.6 mg/dL (ref 8.4–10.5)
GLUCOSE: 129 mg/dL — AB (ref 70–99)
Potassium: 3.6 mmol/L (ref 3.5–5.1)
Sodium: 145 mmol/L (ref 135–145)

## 2014-07-31 LAB — GLUCOSE, CAPILLARY: GLUCOSE-CAPILLARY: 145 mg/dL — AB (ref 70–99)

## 2014-07-31 MED ORDER — INSULIN NPH (HUMAN) (ISOPHANE) 100 UNIT/ML ~~LOC~~ SUSP
8.0000 [IU] | Freq: Once | SUBCUTANEOUS | Status: DC
  Filled 2014-07-31: qty 10

## 2014-07-31 MED ORDER — POLYETHYLENE GLYCOL 3350 17 G PO PACK: 17.0000 g | PACK | Freq: Every day | ORAL | Status: DC | PRN

## 2014-07-31 MED ORDER — INSULIN GLARGINE 100 UNIT/ML ~~LOC~~ SOLN: 33.0000 [IU] | Freq: Every day | SUBCUTANEOUS | Status: DC

## 2014-07-31 MED ORDER — METOCLOPRAMIDE HCL 10 MG PO TABS: 10.0000 mg | ORAL_TABLET | Freq: Three times a day (TID) | ORAL | Status: DC

## 2014-07-31 MED ORDER — AZITHROMYCIN 250 MG PO TABS: 250.0000 mg | ORAL_TABLET | Freq: Every day | ORAL | Status: DC

## 2014-07-31 MED ORDER — OXYCODONE-ACETAMINOPHEN 5-325 MG PO TABS: 1.0000 | ORAL_TABLET | ORAL | Status: DC | PRN

## 2014-07-31 MED ORDER — INSULIN NPH ISOPHANE & REGULAR (70-30) 100 UNIT/ML ~~LOC~~ SUSP: 8.0000 [IU] | Freq: Two times a day (BID) | SUBCUTANEOUS | Status: DC

## 2014-07-31 NOTE — Discharge Summary (Signed)
Physician Discharge Summary  Mary [name] JSH:[id number] DOB: 1970/09/27 DOA: 07/26/2014  PCP: [name], NP  Admit date: 07/26/2014 Discharge date: 07/31/2014  Time spent: 45 minutes  Recommendations for Outpatient Follow-up:  1. [facility] to [facility] up her insulin 2. She needs a gastroenterology referral for hepatitis C   Discharge Diagnoses:  Active Problems:   Non compliance w medication regimen   Leukocytosis   Nausea with vomiting   Protein-calorie malnutrition, severe   DKA (diabetic ketoacidoses)  Discharge Condition: stable  Diet recommendation: diabetic  Filed Weights   07/26/14 2300  Weight: 51 kg (112 lb 7 oz)   History of present illness:  This is a 44 y.o. year old female with significant past medical history of IDDM, bipolar disorder, medical noncompliance, gastroparesis presenting with DKA, abd pain. Pt reports worsened abd pain, decreased po intake over past 2 days. Pt not checking her sugars at home. Has not been taking insulin. 1-2 episodes of nausea and vomiting (NBNB).  Presents to ER T 97.4, HR 110s-140s, resp 10s, BP 120s-150s, satting 100% on RA. WBC 20.6, hgb 15.1, Cr 1.29, Glu 529. Bicarb 10. AG 30. Lactate 3.5. Started on DKA protocol. UDS + THC. UA negative for infection.   Hospital Course:  DKA - patient's acidosis has resolved, gap has closed, this was likely due to medication noncompliance, once her home insulin was started she has remained stable. She doesn't have any more insulin at home, today's dose was given in the hospital, patient [facility] and [facility] her insulin. Prescriptions were given.  Nausea/vomiting/abdominal pain - these are acute on chronic in nature, she's had multiple hospitalizations and ED visits. Because of the fever as above, patient underwent a CT scan which was negative. Her nausea and vomiting have improved with  resolution of her DKA, however her pain has continued to the levels that she had for a long time. On the day of discharge she was able to tolerate a regular diet without further nausea or vomiting. Thrombocytopenia - she has a history of chronic thrombocytopenia likely in the setting of liver disease due to her HCV. Would benefit from GI referral.  Fever - patient with one time fever 2/12, abdominal CT was negative for any infectious source, patient with a cough that has been progressively worsening, she appreciates more sputum, chest x-ray was negative, may be consistent with bronchitis, will start a Z-Pak Protein calorie malnutrition, severe Noncompliance - patient tells me that she ran out of her insulin Seizure disorder - continue Depakote   Procedures:  None    Consultations:  None   Discharge Exam: Filed Vitals:   07/30/14 1405 07/30/14 1812 07/30/14 2152 07/31/14 0511  BP: 147/86 164/77 147/80 157/83  Pulse: 81 66 78 82  Temp: 98.4 F (36.9 C) 98.7 F (37.1 C) 99 F (37.2 C) 98.8 F (37.1 C)  TempSrc: Oral Oral Oral Oral  Resp: 16 16 16 18   Height:      Weight:      SpO2: 98% 97% 99% 97%    General: NAD Cardiovascular: RRR Respiratory: CTA biL  Discharge Instructions     Medication List    TAKE these medications        aspirin EC 81 MG tablet  Take 81 mg by mouth daily.     azithromycin 250 MG tablet  Commonly known as:  ZITHROMAX  Take 1  tablet (250 mg total) by mouth daily. 1 daily for 4 days     divalproex 250 MG DR tablet  Commonly known as:  DEPAKOTE  Take 250 mg by mouth 2 times daily at 12 noon and 4 pm.     gabapentin 300 MG capsule  Commonly known as:  NEURONTIN  Take 300 mg by mouth 3 (three) times daily.     insulin glargine 100 UNIT/ML injection  Commonly known as:  LANTUS  Inject 0.33 mLs (33 Units total) into the skin daily.     insulin NPH-regular Human (70-30) 100 UNIT/ML injection  Commonly known as:  NOVOLIN 70/30  Inject 8  Units into the skin 2 (two) times daily with a meal.     medroxyPROGESTERone 150 MG/ML injection  Commonly known as:  DEPO-PROVERA  Inject 150 mg into the muscle every 3 (three) months.     metoCLOPramide 10 MG tablet  Commonly known as:  REGLAN  Take 1 tablet (10 mg total) by mouth 4 (four) times daily -  before meals and at bedtime.     ondansetron 4 MG tablet  Commonly known as:  ZOFRAN  Take 1 tablet (4 mg total) by mouth every 6 (six) hours.     oxyCODONE-acetaminophen 5-325 MG per tablet  Commonly known as:  PERCOCET/ROXICET  Take 1-2 tablets by mouth every 3 (three) hours as needed for moderate pain.     polyethylene glycol packet  Commonly known as:  MIRALAX / GLYCOLAX  Take 17 g by mouth daily as needed.     promethazine 12.5 MG tablet  Commonly known as:  PHENERGAN  Take 1 tablet (12.5 mg total) by mouth every 6 (six) hours as needed for nausea or vomiting.     risperiDONE 2 MG tablet  Commonly known as:  RISPERDAL  Take 2 mg by mouth at bedtime.           Follow-up Information    Follow up with [name], NP In 1 day.   Specialty:  Internal Medicine   Why:  go to [facility] to pick up insulin in 1 day   Contact information:   [address] [place] [address] [phone]       The results of significant diagnostics from this hospitalization (including imaging, microbiology, ancillary and laboratory) are listed below for reference.    Significant Diagnostic Studies: Dg Chest 2 View  07/29/2014   CLINICAL DATA:  Fever.  EXAM: CHEST  2 VIEW  COMPARISON:  December 23, 2013.  FINDINGS: The heart size and mediastinal contours are within normal limits. Both lungs are clear. No pneumothorax or pleural effusion is noted. The visualized skeletal structures are unremarkable.  IMPRESSION: No acute cardiopulmonary abnormality seen.   Electronically Signed   By: Marijo Conception, M.D.   On: 07/29/2014 13:09   Ct Abdomen Pelvis W Contrast  07/29/2014    CLINICAL DATA:  Fever and upper abdominal pain. History of urinary tract stones, gastritis stent hepatitis. Patient mid to the hospital with diabetic ketoacidosis.  EXAM: CT ABDOMEN AND PELVIS WITH CONTRAST  TECHNIQUE: Multidetector CT imaging of the abdomen and pelvis was performed using the standard protocol following bolus administration of intravenous contrast.  CONTRAST:  100 mL OMNIPAQUE IOHEXOL 300 MG/ML  SOLN  COMPARISON:  CT abdomen and pelvis 03/08/2014 and 06/26/2013.  FINDINGS: Mild dependent atelectasis is seen in the lung bases. No pleural or pericardial effusion.  The gallbladder has been removed. The liver, spleen, adrenal  glands and kidneys are unremarkable. The pancreas appears atrophic with prominence of the pancreatic duct. Atrophy appears progressive compared to the prior exams. No focal lesion is seen.  There is a small volume of free pelvic fluid greater than seen in physiologic change. Uterus, adnexa and urinary bladder appear normal. The stomach, small and large bowel and appendix appear normal.  Convex left scoliosis is noted. No lytic or sclerotic bony lesion is identified.  IMPRESSION: Small volume of free pelvic fluid is greater than typically seen in physiologic change but nonspecific. It may be related to fluid resuscitation or less likely enteritis. The bowel appears normal on this exam.  Atrophic appearance of the pancreas appears progressive since the prior studies.   Electronically Signed   By: Inge Rise M.D.   On: 07/29/2014 13:03   Dg Abd Acute W/chest  07/26/2014   CLINICAL DATA:  Hyperglycemia.  EXAM: ACUTE ABDOMEN SERIES (ABDOMEN 2 VIEW & CHEST 1 VIEW)  COMPARISON:  None.  FINDINGS: There is no evidence of dilated bowel loops or free intraperitoneal air. Status post cholecystectomy. Moderate levoscoliosis of thoracolumbar junction is noted. No radiopaque calculi or other significant radiographic abnormality is seen. Heart size and mediastinal contours are within  normal limits. Both lungs are clear.  IMPRESSION: No evidence of bowel obstruction or ileus. No acute cardiopulmonary disease.   Electronically Signed   By: Marijo Conception, M.D.   On: 07/26/2014 20:02    Microbiology: Recent Results (from the past 240 hour(s))  MRSA PCR Screening     Status: None   Collection Time: 07/26/14 11:17 PM  Result Value Ref Range Status   MRSA by PCR NEGATIVE NEGATIVE Final    Comment:        The GeneXpert MRSA Assay (FDA approved for NASAL specimens only), is one component of a comprehensive MRSA colonization surveillance program. It is not intended to diagnose MRSA infection nor to guide or monitor treatment for MRSA infections.     Labs: Basic Metabolic Panel:  Recent Labs Lab 07/27/14 0530 07/27/14 0935 07/28/14 0455 07/30/14 0500 07/31/14 0550  NA 148* 151* 145 144 145  K 4.6 4.2 4.0 2.7* 3.6  CL 123* 124* 116* 107 109  CO2 16* 20 20 30 28   GLUCOSE 158* 128* 335* 185* 129*  BUN 31* 28* 17 <5* <5*  CREATININE 0.67 0.58 0.51 0.36* 0.42*  CALCIUM 9.2 9.5 8.8 8.8 9.6   Liver Function Tests:  Recent Labs Lab 07/26/14 1801 07/30/14 0540  AST 56* 116*  ALT 63* 88*  ALKPHOS 105 55  BILITOT 1.5* 0.6  PROT 9.2* 5.8*  ALBUMIN 5.0 3.1*   CBC:  Recent Labs Lab 07/26/14 1829 07/28/14 0455 07/30/14 0500  WBC 20.6* 11.8* 5.9  HGB 15.1* 10.8* 10.7*  HCT 48.5* 33.8* 34.0*  MCV 75.2* 71.8* 72.5*  PLT 166 112* 96*   Cardiac Enzymes:  Recent Labs Lab 07/26/14 1821  TROPONINI <0.03   CBG:  Recent Labs Lab 07/30/14 0756 07/30/14 1207 07/30/14 1711 07/30/14 2237 07/31/14 0746  GLUCAP 154* 232* 200* 117* 145*       Signed:  Deontre Allsup  [facility] 07/31/2014, 4:45 PM

## 2014-07-31 NOTE — Progress Notes (Signed)
The central monitor tech called me twice through the night to inform me the patients heart rate was up in the 130/140's sustained briefly then went back down on its own. Both times, the patient was up to the bathroom. No complaints from the patient. Will continue to monitor.   Darsha Zumstein, Julieta Gutting RN

## 2014-07-31 NOTE — Discharge Instructions (Signed)
Follow with [name], NP in 5-7 days  Please get a complete blood count and chemistry panel checked by your Primary MD at your next visit, and again as instructed by your Primary MD. Please get your medications reviewed and adjusted by your Primary MD.  Please request your Primary MD to go over all Hospital Tests and Procedure/Radiological results at the follow up, please get all Hospital records sent to your Prim MD by signing hospital release before you go home.  If you had Pneumonia of Lung problems at the Hospital: Please get a 2 view Chest X ray done in 6-8 weeks after hospital discharge or sooner if instructed by your Primary MD.  If you have Congestive Heart Failure: Please call your Cardiologist or Primary MD anytime you have any of the following symptoms:  1) 3 pound weight gain in 24 hours or 5 pounds in 1 week  2) shortness of breath, with or without a dry hacking cough  3) swelling in the hands, feet or stomach  4) if you have to sleep on extra pillows at night in order to breathe  Follow cardiac low salt diet and 1.5 lit/day fluid restriction.  If you have diabetes Accuchecks 4 times/day, Once in AM empty stomach and then before each meal. Log in all results and show them to your primary doctor at your next visit. If any glucose reading is under 80 or above 300 call your primary MD immediately.  If you have Seizure/Convulsions/Epilepsy: Please do not drive, operate heavy machinery, participate in activities at heights or participate in high speed sports until you have seen by Primary MD or a Neurologist and advised to do so again.  If you had Gastrointestinal Bleeding: Please ask your Primary MD to check a complete blood count within one week of discharge or at your next visit. Your endoscopic/colonoscopic biopsies that are pending at the time of discharge, will also need to followed by your Primary MD.  Get Medicines reviewed and adjusted. Please take all your  medications with you for your next visit with your Primary MD  Please request your Primary MD to go over all hospital tests and procedure/radiological results at the follow up, please ask your Primary MD to get all Hospital records sent to his/her office.  If you experience worsening of your admission symptoms, develop shortness of breath, life threatening emergency, suicidal or homicidal thoughts you must seek medical attention immediately by calling 911 or calling your MD immediately  if symptoms less severe.  You must read complete instructions/literature along with all the possible adverse reactions/side effects for all the Medicines you take and that have been prescribed to you. Take any new Medicines after you have completely understood and accpet all the possible adverse reactions/side effects.   Do not drive or operate heavy machinery when taking Pain medications.   Do not take more than prescribed Pain, Sleep and Anxiety Medications  Special Instructions: If you have smoked or chewed Tobacco  in the last 2 yrs please stop smoking, stop any regular Alcohol  and or any Recreational drug use.  Wear Seat belts while driving.  Please note You were cared for by a hospitalist during your hospital stay. If you have any questions about your discharge medications or the care you received while you were in the hospital after you are discharged, you can call the unit and asked to speak with the hospitalist on call if the hospitalist that took care of you is not available. Once  you are discharged, your primary care physician will handle any further medical issues. Please note that NO REFILLS for any discharge medications will be authorized once you are discharged, as it is imperative that you return to your primary care physician (or establish a relationship with a primary care physician if you do not have one) for your aftercare needs so that they can reassess your need for medications and monitor your  lab values.  You can reach the hospitalist office at phone [phone] or fax [phone]   If you do not have a primary care physician, you can call [phone] for a physician referral.  Activity: As tolerated with Full fall precautions use walker/cane & assistance as needed  Diet: diabetic  Disposition Home

## 2014-08-04 LAB — CULTURE, BLOOD (ROUTINE X 2)
Culture: NO GROWTH
Culture: NO GROWTH

## 2014-08-10 ENCOUNTER — Encounter: Payer: Self-pay | Admitting: Internal Medicine

## 2014-08-10 ENCOUNTER — Ambulatory Visit: Payer: Medicaid Other | Attending: Internal Medicine | Admitting: Internal Medicine

## 2014-08-10 VITALS — BP 103/71 | HR 80 | Temp 98.4°F | Resp 16 | Ht 64.0 in | Wt 125.0 lb

## 2014-08-10 DIAGNOSIS — F319 Bipolar disorder, unspecified: Secondary | ICD-10-CM | POA: Insufficient documentation

## 2014-08-10 DIAGNOSIS — Z9114 Patient's other noncompliance with medication regimen: Secondary | ICD-10-CM | POA: Diagnosis not present

## 2014-08-10 DIAGNOSIS — Z79899 Other long term (current) drug therapy: Secondary | ICD-10-CM | POA: Insufficient documentation

## 2014-08-10 DIAGNOSIS — Z794 Long term (current) use of insulin: Secondary | ICD-10-CM | POA: Insufficient documentation

## 2014-08-10 DIAGNOSIS — E1065 Type 1 diabetes mellitus with hyperglycemia: Secondary | ICD-10-CM

## 2014-08-10 DIAGNOSIS — Z9119 Patient's noncompliance with other medical treatment and regimen: Secondary | ICD-10-CM | POA: Insufficient documentation

## 2014-08-10 DIAGNOSIS — F17210 Nicotine dependence, cigarettes, uncomplicated: Secondary | ICD-10-CM | POA: Insufficient documentation

## 2014-08-10 DIAGNOSIS — Z7982 Long term (current) use of aspirin: Secondary | ICD-10-CM | POA: Insufficient documentation

## 2014-08-10 DIAGNOSIS — B1920 Unspecified viral hepatitis C without hepatic coma: Secondary | ICD-10-CM | POA: Insufficient documentation

## 2014-08-10 DIAGNOSIS — G40909 Epilepsy, unspecified, not intractable, without status epilepticus: Secondary | ICD-10-CM | POA: Insufficient documentation

## 2014-08-10 DIAGNOSIS — R739 Hyperglycemia, unspecified: Secondary | ICD-10-CM

## 2014-08-10 DIAGNOSIS — IMO0002 Reserved for concepts with insufficient information to code with codable children: Secondary | ICD-10-CM

## 2014-08-10 DIAGNOSIS — K3184 Gastroparesis: Secondary | ICD-10-CM | POA: Insufficient documentation

## 2014-08-10 DIAGNOSIS — E119 Type 2 diabetes mellitus without complications: Secondary | ICD-10-CM | POA: Insufficient documentation

## 2014-08-10 LAB — POCT URINALYSIS DIPSTICK
Bilirubin, UA: NEGATIVE
Glucose, UA: 500
Ketones, UA: NEGATIVE
Leukocytes, UA: NEGATIVE
NITRITE UA: NEGATIVE
PH UA: 7
PROTEIN UA: NEGATIVE
RBC UA: NEGATIVE
SPEC GRAV UA: 1.01
UROBILINOGEN UA: 0.2

## 2014-08-10 LAB — POCT GLYCOSYLATED HEMOGLOBIN (HGB A1C): Hemoglobin A1C: 11.5

## 2014-08-10 LAB — GLUCOSE, POCT (MANUAL RESULT ENTRY): POC Glucose: 439 mg/dl — AB (ref 70–99)

## 2014-08-10 MED ORDER — GLUCOSE BLOOD VI STRP: ORAL_STRIP | Status: DC

## 2014-08-10 MED ORDER — INSULIN ASPART 100 UNIT/ML ~~LOC~~ SOLN
20.0000 [IU] | Freq: Once | SUBCUTANEOUS | Status: AC
  Administered 2014-08-10: 20 [IU] via SUBCUTANEOUS

## 2014-08-10 MED ORDER — "INSULIN SYRINGE 28G X 1/2"" 1 ML MISC": Status: DC

## 2014-08-10 MED ORDER — FREESTYLE LANCETS MISC: Status: DC

## 2014-08-10 MED ORDER — INSULIN PEN NEEDLE 31G X 8 MM MISC: Status: DC

## 2014-08-10 NOTE — Progress Notes (Signed)
Patient ID: Mary [name], female   DOB: 02/12/1971, 44 y.o.   MRN: [id number] SUBJECTIVE: 44 y.o. female for follow up of diabetes. She has a past medical history of IDDM, bipolar disorder, medical noncompliance, gastroparesis, past DKA, and seizure disorder.  Patient was just discharged 2 weeks ago with DKA.  Patient has been non-compliant and often runs out of insulin. She sporadically checks her blood sugar and is often admitted for DKA. She has symptoms of gastroparesis, polyneuropathy, blurred vision, and headaches.  Other symptoms and concerns: She is requesting a GI appointment for Hep C treatment.   Current Outpatient Prescriptions  Medication Sig Dispense Refill  . aspirin EC 81 MG tablet Take 81 mg by mouth daily.    . divalproex (DEPAKOTE) 250 MG DR tablet Take 250 mg by mouth 2 times daily at 12 noon and 4 pm.    . gabapentin (NEURONTIN) 300 MG capsule Take 300 mg by mouth 3 (three) times daily.    . insulin glargine (LANTUS) 100 UNIT/ML injection Inject 0.33 mLs (33 Units total) into the skin daily. 10 mL 3  . insulin NPH-regular Human (NOVOLIN 70/30) (70-30) 100 UNIT/ML injection Inject 8 Units into the skin 2 (two) times daily with a meal. 10 mL 1  . medroxyPROGESTERone (DEPO-PROVERA) 150 MG/ML injection Inject 150 mg into the muscle every 3 (three) months.    . metoCLOPramide (REGLAN) 10 MG tablet Take 1 tablet (10 mg total) by mouth 4 (four) times daily -  before meals and at bedtime. 90 tablet 0  . ondansetron (ZOFRAN) 4 MG tablet Take 1 tablet (4 mg total) by mouth every 6 (six) hours. 65 tablet 0  . oxyCODONE-acetaminophen (PERCOCET/ROXICET) 5-325 MG per tablet Take 1-2 tablets by mouth every 3 (three) hours as needed for moderate pain. 30 tablet 0  . polyethylene glycol (MIRALAX / GLYCOLAX) packet Take 17 g by mouth daily as needed. 30 each 0  . risperiDONE (RISPERDAL) 2 MG tablet Take 2 mg by mouth at bedtime.    Marland Kitchen azithromycin (ZITHROMAX) 250 MG tablet Take 1 tablet (250 mg  total) by mouth daily. 1 daily for 4 days (Patient not taking: Reported on 08/10/2014) 4 each 0  . promethazine (PHENERGAN) 12.5 MG tablet Take 1 tablet (12.5 mg total) by mouth every 6 (six) hours as needed for nausea or vomiting. (Patient not taking: Reported on 08/10/2014) 45 tablet 0   No current facility-administered medications for this visit.    OBJECTIVE: Appearance: alert, well appearing, and in no distress and oriented to person, place, and time. BP 103/71 mmHg  Pulse 80  Temp(Src) 98.4 F (36.9 C) (Oral)  Resp 16  Ht 5\' 4"  (1.626 m)  Wt 125 lb (56.7 kg)  BMI 21.45 kg/m2  SpO2 99%  Exam: heart sounds normal rate, regular rhythm, normal S1, S2, no murmurs, rubs, clicks or gallops, normal bilateral carotid upstroke without bruits, no JVD, chest clear, no carotid bruits  ASSESSMENT: Diabetes Mellitus: poorly controlled, patient poorly compliant, needs to quit smoking and needs to follow diet more regularly  PLAN: See orders for this visit as documented in the electronic medical record. Issues reviewed with her: home glucose monitoring emphasized, all medications, side effects and compliance discussed carefully, foot care discussed and Podiatry visits discussed, annual eye examinations at Ophthalmology discussed, long term diabetic complications discussed and patient urged in the strongest terms to quit smoking.  Return in about 2 weeks (around 08/24/2014) for Nurse Visit-cbg log and 4 week RN visit, 3  mo PCP.  [name], NP 08/30/2014 4:00 PM

## 2014-08-10 NOTE — Progress Notes (Signed)
Pt is here following up on her diabetes, HTN, arthritis, anemia and her kidney disease. Pt reports having chronic pain in her legs and feet.

## 2014-08-10 NOTE — Patient Instructions (Signed)
Type 1 Diabetes Mellitus Type 1 diabetes mellitus, often simply referred to as diabetes, is a long-term (chronic) disease. It occurs when the islet cells in the pancreas that make insulin (a hormone) are destroyed and can no longer make insulin. Insulin is needed to move sugars from food into the tissue cells. The tissue cells use the sugars for energy. In people with type 1 diabetes, the sugars build up in the blood instead of going into the tissue cells. As a result, high blood sugar (hyperglycemia) develops. Without insulin, the body breaks down fat cells for the needed energy. This breakdown of fat cells produces acid chemicals (ketones), which increases the acid levels in the body. The effect of either high ketone or high sugar (glucose) levels can be life-threatening.  Type 1 diabetes was also previously called juvenile diabetes. It most often occurs before the age of 30, but it can occur at any age. RISK FACTORS A person is predisposed to developing type 1 diabetes if someone in his or her family has the disease and is exposed to certain additional environmental triggers.  SYMPTOMS  Symptoms of type 1 diabetes may develop gradually over days to weeks or suddenly. The symptoms occur due to hyperglycemia. The symptoms can include:   Increased thirst (polydipsia).  Increased urination (polyuria).  Increased urination during the night (nocturia).  Weight loss. This weight loss may be rapid.  Frequent, recurring infections.  Tiredness (fatigue).  Weakness.  Vision changes, such as blurred vision.  Fruity smell to your breath.  Abdominal pain.  Nausea or vomiting. DIAGNOSIS  Type 1 diabetes is diagnosed when symptoms of diabetes are present and when blood glucose levels are increased. Your blood glucose level may be checked by one or more of the following blood tests:  A fasting blood glucose test. You will not be allowed to eat for at least 8 hours before a blood sample is  taken.  A random blood glucose test. Your blood glucose is checked at any time of the day regardless of when you ate.  A hemoglobin A1c blood glucose test. A hemoglobin A1c test provides information about blood glucose control over the previous 3 months. TREATMENT  Although type 1 diabetes cannot be prevented, it can be managed with insulin, diet, and exercise.  You will need to take insulin daily to keep blood glucose in the desired range.  You will need to match insulin dosing with exercise and healthy food choices. The treatment goal is to maintain the before-meal blood sugar (preprandial glucose) level at 70-130 mg/dL.  HOME CARE INSTRUCTIONS   Have your hemoglobin A1c level checked twice a year.  Perform daily blood glucose monitoring as directed by your health care provider.  Monitor urine ketones when you are ill and as directed by your health care provider.  Take your insulin as directed by your health care provider to maintain your blood glucose level in the desired range.  Never run out of insulin. It is needed every day.  Adjust insulin based on your intake of carbohydrates. Carbohydrates can raise blood glucose levels but need to be included in your diet. Carbohydrates provide vitamins, minerals, and fiber, which are an essential part of a healthy diet. Carbohydrates are found in fruits, vegetables, whole grains, dairy products, legumes, and foods containing added sugars.  Eat healthy foods. Alternate 3 meals with 3 snacks.  Maintain a healthy weight.  Carry a medical alert card or wear your medical alert jewelry.  Carry a 15-gram carbohydrate snack   with you at all times to treat low blood glucose (hypoglycemia). Some examples of 15-gram carbohydrate snacks include:  Glucose tablets, 3 or 4.  Glucose gel, 15-gram tube.  Raisins, 2 tablespoons (24 grams).  Jelly beans, 6.  Animal crackers, 8.  Fruit juice, regular soda, or low-fat milk, 4 ounces (120  mL).  Gummy treats, 9.  Recognize hypoglycemia. Hypoglycemia occurs with blood glucose levels of 70 mg/dL and below. The risk for hypoglycemia increases when fasting or skipping meals, during or after intense exercise, and during sleep. Hypoglycemia symptoms can include:  Tremors or shakes.  Decreased ability to concentrate.  Sweating.  Increased heart rate.  Headache.  Dry mouth.  Hunger.  Irritability.  Anxiety.  Restless sleep.  Altered speech or coordination.  Confusion.  Treat hypoglycemia promptly. If you are alert and able to safely swallow, follow the 15:15 rule:  Take 15-20 grams of rapid-acting glucose or carbohydrate. Rapid-acting options include glucose gel, glucose tablets, or 4 ounces (120 mL) of fruit juice, regular soda, or low-fat milk.  Check your blood glucose level 15 minutes after taking the glucose.  Take 15-20 grams more of glucose if the repeat blood glucose level is still 70 mg/dL or below.  Eat a meal or snack within 1 hour once blood glucose levels return to normal.  Be alert to polyuria and polydipsia, which are early signs of hyperglycemia. An early awareness of hyperglycemia allows for prompt treatment. Treat hyperglycemia as directed by your health care provider.  Exercise regularly as directed by your health care provider. This includes:  Performing resistance training twice a week such as push-ups, sit-ups, lifting weights, or using resistance bands.  Performing 150 minutes of cardio exercises each week such as walking, running, or playing sports.  Staying active and spending no more than 90 minutes at one time being inactive.  Adjust your insulin dosing and food intake as needed if you start a new exercise or sport.  Follow your sick-day plan at any time you are unable to eat or drink as usual.   Do not use any tobacco products including cigarettes, chewing tobacco, or electronic cigarettes. If you need help quitting, ask your  health care provider.  Limit alcohol intake to no more than 1 drink per day for nonpregnant women and 2 drinks per day for men. You should drink alcohol only when you are also eating food. Talk with your health care provider about whether alcohol is safe for you. Tell your health care provider if you drink alcohol several times a week.  Keep all follow-up visits as directed by your health care provider.  Schedule an eye exam within 5 years of diagnosis and then annually.  Perform daily skin and foot care. Examine your skin and feet daily for cuts, bruises, redness, nail problems, bleeding, blisters, or sores. A foot exam by a health care provider should be done annually.  Brush your teeth and gums at least twice a day and floss at least once a day. Follow up with your dentist regularly.  Share your diabetes management plan with your workplace or school.  Stay up-to-date with immunizations. It is recommended that people with diabetes who are over 42 years old get the pneumonia vaccine. In some cases, two separate shots may be given. Ask your health care provider if your pneumonia vaccination is up-to-date.  Learn to manage stress.  Obtain ongoing diabetes education and support as needed.  Participate in or seek rehabilitation as needed to maintain or improve independence  and quality of life. Request a physical or occupational therapy referral if you are having foot or hand numbness, or difficulties with grooming, dressing, eating, or physical activity. SEEK MEDICAL CARE IF:   You are unable to eat food or drink fluids for more than 6 hours.  You have nausea and vomiting for more than 6 hours.  Your blood glucose level is over 240 mg/dL.  There is a change in mental status.  You develop an additional serious illness.  You have diarrhea for more than 6 hours.  You have been sick or have had a fever for a couple of days and are not getting better.  You have pain during any physical  activity. SEEK IMMEDIATE MEDICAL CARE IF:  You have difficulty breathing.  You have moderate to large ketone levels. MAKE SURE YOU:  Understand these instructions.  Will watch your condition.  Will get help right away if you are not doing well or get worse. Document Released: 05/31/2000 Document Revised: 10/18/2013 Document Reviewed: 12/31/2011 Exit[employer] Patient Information 2015 [employer]. This information is not intended to replace advice given to you by your health care provider. Make sure you discuss any questions you have with your health care provider.  

## 2014-08-11 LAB — LIPID PANEL
CHOLESTEROL: 160 mg/dL (ref 0–200)
HDL: 59 mg/dL (ref >=46)
LDL Cholesterol: 84 mg/dL (ref 0–99)
Total CHOL/HDL Ratio: 2.7 Ratio
Triglycerides: 83 mg/dL (ref <150)
VLDL: 17 mg/dL (ref 0–40)

## 2014-08-11 LAB — MICROALBUMIN, URINE: Microalb, Ur: <0.2 mg/dL (ref <2.0)

## 2014-08-12 ENCOUNTER — Telehealth: Payer: Self-pay | Admitting: *Deleted

## 2014-08-12 NOTE — Telephone Encounter (Signed)
Pt aware of cholesterol results

## 2014-08-12 NOTE — Telephone Encounter (Signed)
-----   Message from Lance Bosch, NP sent at 08/11/2014  8:39 PM EST ----- Cholesterol is great!

## 2014-08-14 ENCOUNTER — Inpatient Hospital Stay (HOSPITAL_COMMUNITY)
Admission: EM | Admit: 2014-08-14 | Discharge: 2014-08-17 | DRG: 638 | Disposition: A | Discharge status: Home / Self Care | Payer: Medicaid Other | Attending: Internal Medicine | Admitting: Internal Medicine

## 2014-08-14 ENCOUNTER — Encounter (HOSPITAL_COMMUNITY): Payer: Self-pay | Admitting: *Deleted

## 2014-08-14 DIAGNOSIS — F3181 Bipolar II disorder: Secondary | ICD-10-CM | POA: Diagnosis present

## 2014-08-14 DIAGNOSIS — Z7982 Long term (current) use of aspirin: Secondary | ICD-10-CM

## 2014-08-14 DIAGNOSIS — Z91148 Patient's other noncompliance with medication regimen for other reason: Secondary | ICD-10-CM

## 2014-08-14 DIAGNOSIS — G629 Polyneuropathy, unspecified: Secondary | ICD-10-CM | POA: Diagnosis present

## 2014-08-14 DIAGNOSIS — G40909 Epilepsy, unspecified, not intractable, without status epilepticus: Secondary | ICD-10-CM | POA: Diagnosis not present

## 2014-08-14 DIAGNOSIS — G8929 Other chronic pain: Secondary | ICD-10-CM | POA: Diagnosis present

## 2014-08-14 DIAGNOSIS — K7460 Unspecified cirrhosis of liver: Secondary | ICD-10-CM | POA: Diagnosis present

## 2014-08-14 DIAGNOSIS — Z794 Long term (current) use of insulin: Secondary | ICD-10-CM | POA: Diagnosis not present

## 2014-08-14 DIAGNOSIS — E870 Hyperosmolality and hypernatremia: Secondary | ICD-10-CM | POA: Diagnosis present

## 2014-08-14 DIAGNOSIS — B1920 Unspecified viral hepatitis C without hepatic coma: Secondary | ICD-10-CM | POA: Diagnosis present

## 2014-08-14 DIAGNOSIS — E109 Type 1 diabetes mellitus without complications: Secondary | ICD-10-CM

## 2014-08-14 DIAGNOSIS — R109 Unspecified abdominal pain: Secondary | ICD-10-CM

## 2014-08-14 DIAGNOSIS — E1065 Type 1 diabetes mellitus with hyperglycemia: Secondary | ICD-10-CM | POA: Diagnosis not present

## 2014-08-14 DIAGNOSIS — IMO0002 Reserved for concepts with insufficient information to code with codable children: Secondary | ICD-10-CM | POA: Diagnosis present

## 2014-08-14 DIAGNOSIS — M1990 Unspecified osteoarthritis, unspecified site: Secondary | ICD-10-CM | POA: Diagnosis present

## 2014-08-14 DIAGNOSIS — E1010 Type 1 diabetes mellitus with ketoacidosis without coma: Secondary | ICD-10-CM

## 2014-08-14 DIAGNOSIS — E1143 Type 2 diabetes mellitus with diabetic autonomic (poly)neuropathy: Secondary | ICD-10-CM | POA: Diagnosis present

## 2014-08-14 DIAGNOSIS — I10 Essential (primary) hypertension: Secondary | ICD-10-CM | POA: Diagnosis present

## 2014-08-14 DIAGNOSIS — E1110 Type 2 diabetes mellitus with ketoacidosis without coma: Secondary | ICD-10-CM | POA: Diagnosis present

## 2014-08-14 DIAGNOSIS — Z79899 Other long term (current) drug therapy: Secondary | ICD-10-CM | POA: Diagnosis not present

## 2014-08-14 DIAGNOSIS — R112 Nausea with vomiting, unspecified: Secondary | ICD-10-CM | POA: Diagnosis present

## 2014-08-14 DIAGNOSIS — K219 Gastro-esophageal reflux disease without esophagitis: Secondary | ICD-10-CM | POA: Diagnosis present

## 2014-08-14 DIAGNOSIS — Z9114 Patient's other noncompliance with medication regimen: Secondary | ICD-10-CM | POA: Diagnosis present

## 2014-08-14 DIAGNOSIS — K3184 Gastroparesis: Secondary | ICD-10-CM | POA: Diagnosis present

## 2014-08-14 DIAGNOSIS — F1290 Cannabis use, unspecified, uncomplicated: Secondary | ICD-10-CM | POA: Diagnosis present

## 2014-08-14 DIAGNOSIS — E878 Other disorders of electrolyte and fluid balance, not elsewhere classified: Secondary | ICD-10-CM | POA: Diagnosis present

## 2014-08-14 DIAGNOSIS — F17210 Nicotine dependence, cigarettes, uncomplicated: Secondary | ICD-10-CM | POA: Diagnosis present

## 2014-08-14 DIAGNOSIS — E1040 Type 1 diabetes mellitus with diabetic neuropathy, unspecified: Secondary | ICD-10-CM | POA: Diagnosis present

## 2014-08-14 LAB — COMPREHENSIVE METABOLIC PANEL
ALBUMIN: 4.7 g/dL (ref 3.5–5.2)
ALT: 57 U/L — ABNORMAL HIGH (ref 0–35)
ANION GAP: 19 — AB (ref 5–15)
AST: 62 U/L — ABNORMAL HIGH (ref 0–37)
Alkaline Phosphatase: 91 U/L (ref 39–117)
BILIRUBIN TOTAL: 1.4 mg/dL — AB (ref 0.3–1.2)
BUN: 19 mg/dL (ref 6–23)
CALCIUM: 10.8 mg/dL — AB (ref 8.4–10.5)
CO2: 16 mmol/L — ABNORMAL LOW (ref 19–32)
Chloride: 106 mmol/L (ref 96–112)
Creatinine, Ser: 0.72 mg/dL (ref 0.50–1.10)
GFR calc non Af Amer: >90 mL/min (ref >90)
GLUCOSE: 468 mg/dL — AB (ref 70–99)
POTASSIUM: 4.6 mmol/L (ref 3.5–5.1)
Sodium: 141 mmol/L (ref 135–145)
TOTAL PROTEIN: 8.5 g/dL — AB (ref 6.0–8.3)

## 2014-08-14 LAB — URINALYSIS, ROUTINE W REFLEX MICROSCOPIC
Bilirubin Urine: NEGATIVE
Hgb urine dipstick: NEGATIVE
Ketones, ur: 40 mg/dL — AB
Leukocytes, UA: NEGATIVE
NITRITE: NEGATIVE
PH: 5 (ref 5.0–8.0)
Protein, ur: NEGATIVE mg/dL
SPECIFIC GRAVITY, URINE: 1.037 — AB (ref 1.005–1.030)
Urobilinogen, UA: 0.2 mg/dL (ref 0.0–1.0)

## 2014-08-14 LAB — CBC WITH DIFFERENTIAL/PLATELET
BASOS ABS: 0 10*3/uL (ref 0.0–0.1)
BASOS PCT: 0 % (ref 0–1)
Eosinophils Absolute: 0 10*3/uL (ref 0.0–0.7)
Eosinophils Relative: 0 % (ref 0–5)
HEMATOCRIT: 41 % (ref 36.0–46.0)
Hemoglobin: 12.9 g/dL (ref 12.0–15.0)
LYMPHS ABS: 2.3 10*3/uL (ref 0.7–4.0)
Lymphocytes Relative: 15 % (ref 12–46)
MCH: 22.6 pg — AB (ref 26.0–34.0)
MCHC: 31.5 g/dL (ref 30.0–36.0)
MCV: 71.8 fL — AB (ref 78.0–100.0)
Monocytes Absolute: 0.3 10*3/uL (ref 0.1–1.0)
Monocytes Relative: 2 % — ABNORMAL LOW (ref 3–12)
NEUTROS ABS: 12.4 10*3/uL — AB (ref 1.7–7.7)
Neutrophils Relative %: 83 % — ABNORMAL HIGH (ref 43–77)
PLATELETS: 221 10*3/uL (ref 150–400)
RBC: 5.71 MIL/uL — AB (ref 3.87–5.11)
RDW: 15.5 % (ref 11.5–15.5)
WBC: 15 10*3/uL — ABNORMAL HIGH (ref 4.0–10.5)

## 2014-08-14 LAB — URINE MICROSCOPIC-ADD ON

## 2014-08-14 LAB — CBG MONITORING, ED: Glucose-Capillary: 446 mg/dL — ABNORMAL HIGH (ref 70–99)

## 2014-08-14 LAB — I-STAT TROPONIN, ED: TROPONIN I, POC: 0 ng/mL (ref 0.00–0.08)

## 2014-08-14 LAB — LIPASE, BLOOD: Lipase: 19 U/L (ref 11–59)

## 2014-08-14 LAB — GLUCOSE, CAPILLARY: GLUCOSE-CAPILLARY: 379 mg/dL — AB (ref 70–99)

## 2014-08-14 LAB — POC URINE PREG, ED: Preg Test, Ur: NEGATIVE

## 2014-08-14 MED ORDER — DEXTROSE-NACL 5-0.45 % IV SOLN: INTRAVENOUS | Status: DC

## 2014-08-14 MED ORDER — METOCLOPRAMIDE HCL 5 MG/ML IJ SOLN
10.0000 mg | Freq: Four times a day (QID) | INTRAMUSCULAR | Status: DC
  Administered 2014-08-15 – 2014-08-17 (×10): 10 mg via INTRAVENOUS
  Filled 2014-08-14 (×10): qty 2

## 2014-08-14 MED ORDER — KETOROLAC TROMETHAMINE 15 MG/ML IJ SOLN
15.0000 mg | Freq: Four times a day (QID) | INTRAMUSCULAR | Status: DC
  Administered 2014-08-15 – 2014-08-17 (×10): 15 mg via INTRAVENOUS
  Filled 2014-08-14 (×10): qty 1

## 2014-08-14 MED ORDER — HYDROMORPHONE HCL 1 MG/ML IJ SOLN
1.0000 mg | Freq: Once | INTRAMUSCULAR | Status: AC
  Administered 2014-08-14: 1 mg via INTRAMUSCULAR
  Filled 2014-08-14: qty 1

## 2014-08-14 MED ORDER — DEXTROSE-NACL 5-0.45 % IV SOLN
INTRAVENOUS | Status: DC
  Administered 2014-08-15 (×2): via INTRAVENOUS

## 2014-08-14 MED ORDER — POTASSIUM CHLORIDE 10 MEQ/100ML IV SOLN
10.0000 meq | INTRAVENOUS | Status: DC
  Filled 2014-08-14: qty 100

## 2014-08-14 MED ORDER — OXYCODONE-ACETAMINOPHEN 5-325 MG PO TABS
1.0000 | ORAL_TABLET | ORAL | Status: DC | PRN
  Administered 2014-08-15 – 2014-08-16 (×2): 2 via ORAL
  Filled 2014-08-14 (×2): qty 2

## 2014-08-14 MED ORDER — GABAPENTIN 300 MG PO CAPS
300.0000 mg | ORAL_CAPSULE | Freq: Three times a day (TID) | ORAL | Status: DC
  Administered 2014-08-15 – 2014-08-17 (×7): 300 mg via ORAL
  Filled 2014-08-14 (×8): qty 1

## 2014-08-14 MED ORDER — ONDANSETRON HCL 4 MG/2ML IJ SOLN
4.0000 mg | Freq: Once | INTRAMUSCULAR | Status: AC
  Administered 2014-08-14: 4 mg via INTRAVENOUS
  Filled 2014-08-14: qty 2

## 2014-08-14 MED ORDER — SODIUM CHLORIDE 0.9 % IV BOLUS (SEPSIS)
1000.0000 mL | Freq: Once | INTRAVENOUS | Status: AC
  Administered 2014-08-14: 1000 mL via INTRAVENOUS

## 2014-08-14 MED ORDER — DIVALPROEX SODIUM 250 MG PO DR TAB
250.0000 mg | DELAYED_RELEASE_TABLET | Freq: Two times a day (BID) | ORAL | Status: DC
  Administered 2014-08-15: 250 mg via ORAL
  Filled 2014-08-14 (×2): qty 1

## 2014-08-14 MED ORDER — RISPERIDONE 0.5 MG PO TABS
2.0000 mg | ORAL_TABLET | Freq: Every day | ORAL | Status: DC
  Administered 2014-08-15 – 2014-08-16 (×3): 2 mg via ORAL
  Filled 2014-08-14 (×3): qty 4

## 2014-08-14 MED ORDER — INSULIN REGULAR HUMAN 100 UNIT/ML IJ SOLN
INTRAMUSCULAR | Status: DC
  Administered 2014-08-14: 3.2 [IU]/h via INTRAVENOUS
  Filled 2014-08-14: qty 2.5

## 2014-08-14 MED ORDER — MORPHINE SULFATE 2 MG/ML IJ SOLN
2.0000 mg | INTRAMUSCULAR | Status: DC | PRN
  Administered 2014-08-14 – 2014-08-17 (×8): 2 mg via INTRAVENOUS
  Filled 2014-08-14: qty 2
  Filled 2014-08-14 (×6): qty 1

## 2014-08-14 MED ORDER — ONDANSETRON HCL 4 MG/2ML IJ SOLN
4.0000 mg | Freq: Four times a day (QID) | INTRAMUSCULAR | Status: DC | PRN
  Administered 2014-08-16: 4 mg via INTRAVENOUS
  Filled 2014-08-14: qty 2

## 2014-08-14 MED ORDER — SODIUM CHLORIDE 0.9 % IV SOLN
INTRAVENOUS | Status: DC
  Filled 2014-08-14: qty 2.5

## 2014-08-14 MED ORDER — SODIUM CHLORIDE 0.9 % IV SOLN
INTRAVENOUS | Status: DC
  Administered 2014-08-14: via INTRAVENOUS

## 2014-08-14 MED ORDER — POLYETHYLENE GLYCOL 3350 17 G PO PACK
17.0000 g | PACK | Freq: Every day | ORAL | Status: DC
  Administered 2014-08-16 – 2014-08-17 (×2): 17 g via ORAL
  Filled 2014-08-14 (×3): qty 1

## 2014-08-14 MED ORDER — PANTOPRAZOLE SODIUM 40 MG IV SOLR
40.0000 mg | Freq: Two times a day (BID) | INTRAVENOUS | Status: DC
  Administered 2014-08-15 – 2014-08-17 (×6): 40 mg via INTRAVENOUS
  Filled 2014-08-14 (×6): qty 40

## 2014-08-14 MED ORDER — POTASSIUM CHLORIDE 10 MEQ/100ML IV SOLN
10.0000 meq | INTRAVENOUS | Status: AC
  Administered 2014-08-14 – 2014-08-15 (×2): 10 meq via INTRAVENOUS

## 2014-08-14 MED ORDER — ENOXAPARIN SODIUM 40 MG/0.4ML ~~LOC~~ SOLN
40.0000 mg | Freq: Every day | SUBCUTANEOUS | Status: DC
  Administered 2014-08-15 – 2014-08-16 (×3): 40 mg via SUBCUTANEOUS
  Filled 2014-08-14 (×3): qty 0.4

## 2014-08-14 MED ORDER — METOCLOPRAMIDE HCL 5 MG/ML IJ SOLN
10.0000 mg | Freq: Once | INTRAMUSCULAR | Status: AC
  Administered 2014-08-14: 10 mg via INTRAVENOUS
  Filled 2014-08-14: qty 2

## 2014-08-14 NOTE — ED Notes (Signed)
Awaiting arrival of Insulin infusion from Pharmacy Message sent via [facility]

## 2014-08-14 NOTE — ED Notes (Signed)
Able to obtain PIV site in hand, but unable to get blood for labs ED Charge and additional nursing staff at bedside

## 2014-08-14 NOTE — ED Notes (Signed)
Bed: RESA Expected date:  Expected time:  Means of arrival:  Comments: EMS abd pain, N/V

## 2014-08-14 NOTE — ED Provider Notes (Signed)
Angiocath insertion Performed by: Abigail Butts  Consent: Verbal consent obtained. Risks and benefits: risks, benefits and alternatives were discussed Time out: Immediately prior to procedure a "time out" was called to verify the correct patient, procedure, equipment, support staff and site/side marked as required.  Preparation: Patient was prepped and draped in the usual sterile fashion.  Vein Location: right EJ  Gauge: 20ga  Normal blood return and flush without difficulty Patient tolerance: Patient tolerated the procedure well with no immediate complications.     Jarrett Soho Macy Polio, PA-C 08/14/14 2152  Nat Christen, MD 08/17/14 [phone]

## 2014-08-14 NOTE — ED Notes (Signed)
Patient brought in by EMS for c/o abdominal pain, N/V since this morning Per EMS and pt's son, this is a monthly occurrence Patient has not had insulin since yesterday, because "I don't feel good enough to take it" CBG 420 per EMS Patient alert and oriented x 4 upon arrival

## 2014-08-14 NOTE — ED Notes (Signed)
Patient refusing to let ED staff obtain VS

## 2014-08-14 NOTE — ED Notes (Signed)
Multiple attempts by several staff members have been made to obtain PIV access without success Dr. Lacinda Axon at bedside and aware

## 2014-08-14 NOTE — ED Notes (Signed)
Bed: WA18 Expected date:  Expected time:  Means of arrival:  Comments: RESA 

## 2014-08-14 NOTE — ED Notes (Signed)
Unable to obtain PIV.  Per pt she usually has an ultrasound guided IV.  MD aware.

## 2014-08-14 NOTE — ED Provider Notes (Addendum)
CSN: [id number]     Arrival date & time 08/14/14  1958 History   First MD Initiated Contact with Patient 08/14/14 2004     Chief Complaint  Patient presents with  . Abdominal Pain  . Nausea     (Consider location/radiation/quality/duration/timing/severity/associated sxs/prior Treatment) HPI level V caveat for urgent need for intervention. Patient is a type I diabetic who presents with abdominal pain, nausea, vomiting since this morning. She is chronically ill and often noncompliant with her insulin. She feels dehydrated. No prodromal illnesses.   Past Medical History  Diagnosis Date  . Kidney stone   . Seizure disorder     started with pregnancy of first son  . Gastritis   . Bipolar 2 disorder   . Post traumatic stress disorder     "flipping out" after people close to her died  . Heart murmur   . Arthritis     r ankle-S/P surgery (2007)  . Anemia     childhood  . Depression     childhood  . Hepatitis C     biopsy in 2010-no rx-was supposed to see a hepatologist in [place].  With cirrhosis  . Neuropathy     in legs and feet and hands  . Cataracts, bilateral   . Scoliosis   . Diabetes mellitus     diagnosed in 1996-always been on insulin  . DKA (diabetic ketoacidoses)     Recurrent admissions for DKA, medication non-complaince, poor social situation  . Diabetic neuropathy, type I diabetes mellitus     numbness bilaterally feet  . HTN (hypertension) 10/12/2013   Past Surgical History  Procedure Laterality Date  . Ankle surgery  2007  . Endometrial biopsy  06/22/2012  . Cholecystectomy  04/07/2013  . Cholecystectomy N/A 04/07/2013    Procedure: LAPAROSCOPIC CHOLECYSTECTOMY WITH INTRAOPERATIVE CHOLANGIOGRAM;  Surgeon: Adin Hector, MD;  Location: [facility]S;  Service: General;  Laterality: N/A;  . Cesarean section      3 times   Family History  Problem Relation Age of Onset  . Diabetes Mother     currently 87  . Fibromyalgia Mother   . Cirrhosis Father    died in 13  . Diabetes Maternal Grandmother   . Diabetes Maternal Aunt    History  Substance Use Topics  . Smoking status: Current Every Day Smoker -- 0.10 packs/day for 25 years    Types: Cigarettes  . Smokeless tobacco: Never Used  . Alcohol Use: No     Comment: Endorses hasn't been drinking   OB History    Gravida Para Term Preterm AB TAB SAB Ectopic Multiple Living   4 3 3  0 1 0 1 0 1 2     Review of Systems  Unable to perform ROS: Acuity of condition      Allergies  Penicillins  Home Medications   Prior to Admission medications   Medication Sig Start Date End Date Taking? Authorizing Provider  aspirin EC 81 MG tablet Take 81 mg by mouth daily.   Yes Historical Provider, MD  divalproex (DEPAKOTE) 250 MG DR tablet Take 250 mg by mouth 2 times daily at 12 noon and 4 pm.   Yes Historical Provider, MD  gabapentin (NEURONTIN) 300 MG capsule Take 300 mg by mouth 3 (three) times daily.   Yes Historical Provider, MD  glucose blood test strip Check blood sugar four times daily for E10.65 08/10/14  Yes Lance Bosch, NP  insulin glargine (LANTUS) 100 UNIT/ML injection Inject 0.33  mLs (33 Units total) into the skin daily. 07/31/14  Yes Costin Karlyne Greenspan, MD  insulin NPH-regular Human (NOVOLIN 70/30) (70-30) 100 UNIT/ML injection Inject 8 Units into the skin 2 (two) times daily with a meal. 07/31/14  Yes Costin Karlyne Greenspan, MD  Insulin Pen Needle (1ST CHOICE PEN NEEDLES) 31G X 8 MM MISC Take insulin nightly for E10.65 08/10/14  Yes Lance Bosch, NP  Insulin Syringe-Needle U-100 (INSULIN SYRINGE 1CC/28G) 28G X 1/2" 1 ML MISC Use NPH twice daily for E10.65 08/10/14  Yes Lance Bosch, NP  Lancets (FREESTYLE) lancets Check sugar four times per day for E10.65 08/10/14  Yes Lance Bosch, NP  medroxyPROGESTERone (DEPO-PROVERA) 150 MG/ML injection Inject 150 mg into the muscle every 3 (three) months.   Yes Historical Provider, MD  metoCLOPramide (REGLAN) 10 MG tablet Take 1 tablet (10 mg  total) by mouth 4 (four) times daily -  before meals and at bedtime. 07/31/14  Yes Costin Karlyne Greenspan, MD  ondansetron (ZOFRAN) 4 MG tablet Take 1 tablet (4 mg total) by mouth every 6 (six) hours. 06/08/14  Yes Theodis Blaze, MD  oxyCODONE-acetaminophen (PERCOCET/ROXICET) 5-325 MG per tablet Take 1-2 tablets by mouth every 3 (three) hours as needed for moderate pain. 07/31/14  Yes Costin Karlyne Greenspan, MD  polyethylene glycol (MIRALAX / GLYCOLAX) packet Take 17 g by mouth daily as needed. 07/31/14  Yes Costin Karlyne Greenspan, MD  risperiDONE (RISPERDAL) 2 MG tablet Take 2 mg by mouth at bedtime.   Yes Historical Provider, MD  azithromycin (ZITHROMAX) 250 MG tablet Take 1 tablet (250 mg total) by mouth daily. 1 daily for 4 days Patient not taking: Reported on 08/10/2014 07/31/14   Caren Griffins, MD  promethazine (PHENERGAN) 12.5 MG tablet Take 1 tablet (12.5 mg total) by mouth every 6 (six) hours as needed for nausea or vomiting. Patient not taking: Reported on 08/10/2014 06/08/14   Theodis Blaze, MD   BP 126/69 mmHg  Pulse 98  Temp(Src) 98.7 F (37.1 C) (Oral)  Resp 13  Ht 5\' 4"  (1.626 m)  Wt 125 lb (56.7 kg)  BMI 21.45 kg/m2  SpO2 100% Physical Exam  Constitutional: She is oriented to person, place, and time.  Dehydrated, ill appearing  HENT:  Head: Normocephalic and atraumatic.  Eyes: Conjunctivae and EOM are normal. Pupils are equal, round, and reactive to light.  Neck: Normal range of motion. Neck supple.  Cardiovascular: Normal rate and regular rhythm.   Pulmonary/Chest: Effort normal and breath sounds normal.  Abdominal: Soft. Bowel sounds are normal.  Musculoskeletal: Normal range of motion.  Neurological: She is alert and oriented to person, place, and time.  Skin: Skin is warm and dry.  Psychiatric: She has a normal mood and affect. Her behavior is normal.  Nursing note and vitals reviewed.   ED Course  Procedures (including critical care time) Labs Review Labs Reviewed  CBC WITH  DIFFERENTIAL/PLATELET - Abnormal; Notable for the following:    WBC 15.0 (*)    RBC 5.71 (*)    MCV 71.8 (*)    MCH 22.6 (*)    All other components within normal limits  COMPREHENSIVE METABOLIC PANEL - Abnormal; Notable for the following:    CO2 16 (*)    Glucose, Bld 468 (*)    Calcium 10.8 (*)    Total Protein 8.5 (*)    AST 62 (*)    ALT 57 (*)    Total Bilirubin 1.4 (*)  Anion gap 19 (*)    All other components within normal limits  CBG MONITORING, ED - Abnormal; Notable for the following:    Glucose-Capillary 446 (*)    All other components within normal limits  LIPASE, BLOOD  URINALYSIS, ROUTINE W REFLEX MICROSCOPIC  I-STAT TROPOININ, ED  POC URINE PREG, ED    Imaging Review No results found.   EKG Interpretation   Date/Time:  Sunday August 14 2014 20:39:38 EST Ventricular Rate:  89 PR Interval:  144 QRS Duration: 101 QT Interval:  402 QTC Calculation: 489 R Axis:   80 Text Interpretation:  Ectopic atrial tachycardia, unifocal Ventricular  premature complex Probable left ventricular hypertrophy ST depr, consider  ischemia, inferior leads Anterior ST elevation, probably due to LVH  Borderline prolonged QT interval Confirmed by Lacinda Axon  MD, Phyillis Dascoli ([id number]) on  08/14/2014 9:33:02 PM     CRITICAL CARE Performed by: Nat Christen  ?  Total critical care time: 30  Critical care time was exclusive of separately billable procedures and treating other patients.  Critical care was necessary to treat or prevent imminent or life-threatening deterioration.  Critical care was time spent personally by me on the following activities: development of treatment plan with patient and/or surrogate as well as nursing, discussions with consultants, evaluation of patient's response to treatment, examination of patient, obtaining history from patient or surrogate, ordering and performing treatments and interventions, ordering and review of laboratory studies, ordering and review of  radiographic studies, pulse oximetry and re-evaluation of patient's condition. MDM   Final diagnoses:  Diabetic ketoacidosis without coma associated with type 1 diabetes mellitus    Patient presents with nausea, vomiting, dehydration.  She is a type I diabetic. Labs support a diagnosis of DKA. Vigorous IV hydration. IV insulin. Admit.    Nat Christen, MD 08/14/14 [place], MD 08/14/14 2325

## 2014-08-14 NOTE — ED Notes (Signed)
Unable to obtain PIV or labs due to difficulty in obtaining access Will inform MD that patient requires [place] IV

## 2014-08-14 NOTE — ED Notes (Signed)
Dr. Patel at bedside 

## 2014-08-15 LAB — BASIC METABOLIC PANEL
ANION GAP: 8 (ref 5–15)
ANION GAP: 7 (ref 5–15)
Anion gap: 15 (ref 5–15)
Anion gap: 6 (ref 5–15)
BUN: 18 mg/dL (ref 6–23)
BUN: 19 mg/dL (ref 6–23)
BUN: 19 mg/dL (ref 6–23)
BUN: 20 mg/dL (ref 6–23)
CALCIUM: 9.3 mg/dL (ref 8.4–10.5)
CALCIUM: 9.4 mg/dL (ref 8.4–10.5)
CHLORIDE: 119 mmol/L — AB (ref 96–112)
CO2: 19 mmol/L (ref 19–32)
CO2: 20 mmol/L (ref 19–32)
CO2: 18 mmol/L — ABNORMAL LOW (ref 19–32)
CO2: 18 mmol/L — AB (ref 19–32)
CREATININE: 0.53 mg/dL (ref 0.50–1.10)
CREATININE: 0.54 mg/dL (ref 0.50–1.10)
Calcium: 9.2 mg/dL (ref 8.4–10.5)
Calcium: 9.9 mg/dL (ref 8.4–10.5)
Chloride: 113 mmol/L — ABNORMAL HIGH (ref 96–112)
Chloride: 119 mmol/L — ABNORMAL HIGH (ref 96–112)
Chloride: 119 mmol/L — ABNORMAL HIGH (ref 96–112)
Creatinine, Ser: 0.56 mg/dL (ref 0.50–1.10)
Creatinine, Ser: 0.57 mg/dL (ref 0.50–1.10)
GFR calc Af Amer: >90 mL/min (ref >90)
GFR calc Af Amer: >90 mL/min (ref >90)
GFR calc non Af Amer: >90 mL/min (ref >90)
GFR calc non Af Amer: >90 mL/min (ref >90)
GFR calc non Af Amer: >90 mL/min (ref >90)
GFR calc non Af Amer: >90 mL/min (ref >90)
GLUCOSE: 133 mg/dL — AB (ref 70–99)
Glucose, Bld: 143 mg/dL — ABNORMAL HIGH (ref 70–99)
Glucose, Bld: 215 mg/dL — ABNORMAL HIGH (ref 70–99)
Glucose, Bld: 372 mg/dL — ABNORMAL HIGH (ref 70–99)
POTASSIUM: 4.5 mmol/L (ref 3.5–5.1)
Potassium: 3.8 mmol/L (ref 3.5–5.1)
Potassium: 4 mmol/L (ref 3.5–5.1)
Potassium: 4.6 mmol/L (ref 3.5–5.1)
SODIUM: 146 mmol/L — AB (ref 135–145)
Sodium: 146 mmol/L — ABNORMAL HIGH (ref 135–145)
Sodium: 146 mmol/L — ABNORMAL HIGH (ref 135–145)
Sodium: 143 mmol/L (ref 135–145)

## 2014-08-15 LAB — VALPROIC ACID LEVEL

## 2014-08-15 LAB — GLUCOSE, CAPILLARY
GLUCOSE-CAPILLARY: 142 mg/dL — AB (ref 70–99)
GLUCOSE-CAPILLARY: 161 mg/dL — AB (ref 70–99)
GLUCOSE-CAPILLARY: 186 mg/dL — AB (ref 70–99)
GLUCOSE-CAPILLARY: 285 mg/dL — AB (ref 70–99)
GLUCOSE-CAPILLARY: 296 mg/dL — AB (ref 70–99)
Glucose-Capillary: 268 mg/dL — ABNORMAL HIGH (ref 70–99)
Glucose-Capillary: 192 mg/dL — ABNORMAL HIGH (ref 70–99)
Glucose-Capillary: 195 mg/dL — ABNORMAL HIGH (ref 70–99)
Glucose-Capillary: 198 mg/dL — ABNORMAL HIGH (ref 70–99)
Glucose-Capillary: 164 mg/dL — ABNORMAL HIGH (ref 70–99)
Glucose-Capillary: 133 mg/dL — ABNORMAL HIGH (ref 70–99)
Glucose-Capillary: 132 mg/dL — ABNORMAL HIGH (ref 70–99)
Glucose-Capillary: 174 mg/dL — ABNORMAL HIGH (ref 70–99)
Glucose-Capillary: 134 mg/dL — ABNORMAL HIGH (ref 70–99)
Glucose-Capillary: 282 mg/dL — ABNORMAL HIGH (ref 70–99)

## 2014-08-15 LAB — PHOSPHORUS: Phosphorus: 4.7 mg/dL — ABNORMAL HIGH (ref 2.3–4.6)

## 2014-08-15 MED ORDER — INSULIN ASPART 100 UNIT/ML ~~LOC~~ SOLN
0.0000 [IU] | Freq: Three times a day (TID) | SUBCUTANEOUS | Status: DC
  Administered 2014-08-15 (×2): 3 [IU] via SUBCUTANEOUS
  Administered 2014-08-16: 5 [IU] via SUBCUTANEOUS
  Administered 2014-08-16: 11 [IU] via SUBCUTANEOUS
  Administered 2014-08-16: 3 [IU] via SUBCUTANEOUS
  Administered 2014-08-17: 8 [IU] via SUBCUTANEOUS

## 2014-08-15 MED ORDER — INSULIN ASPART 100 UNIT/ML ~~LOC~~ SOLN
0.0000 [IU] | Freq: Every day | SUBCUTANEOUS | Status: DC
Start: 2014-08-15 — End: 2014-08-17
  Administered 2014-08-15: 3 [IU] via SUBCUTANEOUS
  Administered 2014-08-16: 2 [IU] via SUBCUTANEOUS

## 2014-08-15 MED ORDER — SODIUM CHLORIDE 0.45 % IV SOLN
INTRAVENOUS | Status: DC
  Administered 2014-08-15 – 2014-08-17 (×3): via INTRAVENOUS

## 2014-08-15 MED ORDER — INSULIN GLARGINE 100 UNIT/ML ~~LOC~~ SOLN
10.0000 [IU] | Freq: Once | SUBCUTANEOUS | Status: AC
  Administered 2014-08-15: 10 [IU] via SUBCUTANEOUS
  Filled 2014-08-15: qty 0.1

## 2014-08-15 MED ORDER — SODIUM CHLORIDE 0.45 % IV SOLN
INTRAVENOUS | Status: DC
  Administered 2014-08-15: 14:00:00 via INTRAVENOUS
  Filled 2014-08-15: qty 1000

## 2014-08-15 MED ORDER — INSULIN GLARGINE 100 UNIT/ML ~~LOC~~ SOLN: 15.0000 [IU] | Freq: Every day | SUBCUTANEOUS | Status: DC

## 2014-08-15 MED ORDER — INSULIN GLARGINE 100 UNIT/ML ~~LOC~~ SOLN
15.0000 [IU] | Freq: Every day | SUBCUTANEOUS | Status: DC
Start: 2014-08-15 — End: 2014-08-16
  Administered 2014-08-15: 15 [IU] via SUBCUTANEOUS
  Filled 2014-08-15 (×2): qty 0.15

## 2014-08-15 MED ORDER — DIVALPROEX SODIUM 500 MG PO DR TAB
500.0000 mg | DELAYED_RELEASE_TABLET | Freq: Two times a day (BID) | ORAL | Status: DC
  Administered 2014-08-15 – 2014-08-16 (×3): 500 mg via ORAL
  Filled 2014-08-15 (×5): qty 1

## 2014-08-15 NOTE — H&P (Signed)
[facility] History and Physical  Patient: Mary [name]  MRN: [id number]  DOB: 1970/08/16  DOS: the patient was seen and examined on 08/14/2014 PCP: [name], NP  Chief Complaint: Nausea vomiting  HPI: Mary [name] is a 44 y.o. female with Past medical history of seizure disorder, gastroparesis, diabetes mellitus insulin-dependent noncompliance with medical management, chronic abdominal pain. The patient is presenting with complaints of nausea vomiting and abdominal pain ongoing since early in the morning. She mentions she is compliant with all her medication and order insulin and despite that he started having complains of upper epigastric pain earlier later on started having multiple episodes of nausea and vomiting. She denies any active bleeding. Denies any fever or chills denies any diarrhea or constipation. She mentions that she takes 2 meals a day on a regular day when she is feeling fine. She also mentions that she takes Reglan only as needed instead of scheduled. She denies any recent changes in her medications. Her UDS has been positive for multiple timesTHC   The patient is coming from home And at her baseline independent for most of her ADL.  Review of Systems: as mentioned in the history of present illness.  A Comprehensive review of the other systems is negative.  Past Medical History  Diagnosis Date  . Kidney stone   . Seizure disorder     started with pregnancy of first son  . Gastritis   . Bipolar 2 disorder   . Post traumatic stress disorder     "flipping out" after people close to her died  . Heart murmur   . Arthritis     r ankle-S/P surgery (2007)  . Anemia     childhood  . Depression     childhood  . Hepatitis C     biopsy in 2010-no rx-was supposed to see a hepatologist in [place].  With cirrhosis  . Neuropathy     in legs and feet and hands  . Cataracts, bilateral   . Scoliosis   . Diabetes mellitus     diagnosed in  1996-always been on insulin  . DKA (diabetic ketoacidoses)     Recurrent admissions for DKA, medication non-complaince, poor social situation  . Diabetic neuropathy, type I diabetes mellitus     numbness bilaterally feet  . HTN (hypertension) 10/12/2013   Past Surgical History  Procedure Laterality Date  . Ankle surgery  2007  . Endometrial biopsy  06/22/2012  . Cholecystectomy  04/07/2013  . Cholecystectomy N/A 04/07/2013    Procedure: LAPAROSCOPIC CHOLECYSTECTOMY WITH INTRAOPERATIVE CHOLANGIOGRAM;  Surgeon: Adin Hector, MD;  Location: [facility];  Service: General;  Laterality: N/A;  . Cesarean section      3 times   Social History:  reports that she has been smoking Cigarettes.  She has a 2.5 pack-year smoking history. She has never used smokeless tobacco. She reports that she uses illicit drugs (Marijuana) about once per week. She reports that she does not drink alcohol.  Allergies  Allergen Reactions  . Penicillins Rash    Family History  Problem Relation Age of Onset  . Diabetes Mother     currently 61  . Fibromyalgia Mother   . Cirrhosis Father     died in 72  . Diabetes Maternal Grandmother   . Diabetes Maternal Aunt     Prior to Admission medications   Medication Sig Start Date End Date Taking? Authorizing Provider  aspirin EC 81 MG tablet Take 81 mg by mouth  daily.   Yes Historical Provider, MD  divalproex (DEPAKOTE) 250 MG DR tablet Take 250 mg by mouth 2 times daily at 12 noon and 4 pm.   Yes Historical Provider, MD  gabapentin (NEURONTIN) 300 MG capsule Take 300 mg by mouth 3 (three) times daily.   Yes Historical Provider, MD  glucose blood test strip Check blood sugar four times daily for E10.65 08/10/14  Yes Lance Bosch, NP  insulin glargine (LANTUS) 100 UNIT/ML injection Inject 0.33 mLs (33 Units total) into the skin daily. 07/31/14  Yes Costin Karlyne Greenspan, MD  insulin NPH-regular Human (NOVOLIN 70/30) (70-30) 100 UNIT/ML injection Inject 8 Units into the skin  2 (two) times daily with a meal. 07/31/14  Yes Costin Karlyne Greenspan, MD  Insulin Pen Needle (1ST CHOICE PEN NEEDLES) 31G X 8 MM MISC Take insulin nightly for E10.65 08/10/14  Yes Lance Bosch, NP  Insulin Syringe-Needle U-100 (INSULIN SYRINGE 1CC/28G) 28G X 1/2" 1 ML MISC Use NPH twice daily for E10.65 08/10/14  Yes Lance Bosch, NP  Lancets (FREESTYLE) lancets Check sugar four times per day for E10.65 08/10/14  Yes Lance Bosch, NP  medroxyPROGESTERone (DEPO-PROVERA) 150 MG/ML injection Inject 150 mg into the muscle every 3 (three) months.   Yes Historical Provider, MD  metoCLOPramide (REGLAN) 10 MG tablet Take 1 tablet (10 mg total) by mouth 4 (four) times daily -  before meals and at bedtime. 07/31/14  Yes Costin Karlyne Greenspan, MD  ondansetron (ZOFRAN) 4 MG tablet Take 1 tablet (4 mg total) by mouth every 6 (six) hours. 06/08/14  Yes Theodis Blaze, MD  oxyCODONE-acetaminophen (PERCOCET/ROXICET) 5-325 MG per tablet Take 1-2 tablets by mouth every 3 (three) hours as needed for moderate pain. 07/31/14  Yes Costin Karlyne Greenspan, MD  polyethylene glycol (MIRALAX / GLYCOLAX) packet Take 17 g by mouth daily as needed. 07/31/14  Yes Costin Karlyne Greenspan, MD  risperiDONE (RISPERDAL) 2 MG tablet Take 2 mg by mouth at bedtime.   Yes Historical Provider, MD  azithromycin (ZITHROMAX) 250 MG tablet Take 1 tablet (250 mg total) by mouth daily. 1 daily for 4 days Patient not taking: Reported on 08/10/2014 07/31/14   Caren Griffins, MD  promethazine (PHENERGAN) 12.5 MG tablet Take 1 tablet (12.5 mg total) by mouth every 6 (six) hours as needed for nausea or vomiting. Patient not taking: Reported on 08/10/2014 06/08/14   Theodis Blaze, MD    Physical Exam: Filed Vitals:   08/14/14 2200 08/14/14 2230 08/14/14 2300 08/14/14 2318  BP: 126/69 124/64 137/66 147/87  Pulse: 98 100 103 107  Temp:    99 F (37.2 C)  TempSrc:    Oral  Resp: 13 28 14 16   Height:      Weight:    52 kg (114 lb 10.2 oz)  SpO2: 100% 100% 99% 100%     General: Alert, Awake and Oriented to Time, Place and Person. Appear in mild distress Eyes: PERRL ENT: Oral Mucosa clear dry. Neck: no JVD Cardiovascular: S1 and S2 Present, no Murmur, Peripheral Pulses Present Respiratory: Bilateral Air entry equal and Decreased, Clear to Auscultation, noCrackles, no wheezes Abdomen: Bowel Sound present, Soft and diffusely  tender Skin: no Rash Extremities: no Pedal edema, no calf tenderness Neurologic: Grossly no focal neuro deficit.  Labs on Admission:  CBC:  Recent Labs Lab 08/14/14 2052  WBC 15.0*  NEUTROABS 12.4*  HGB 12.9  HCT 41.0  MCV 71.8*  PLT 221  CMP     Component Value Date/Time   NA 146* 08/14/2014 2340   K 4.5 08/14/2014 2340   CL 113* 08/14/2014 2340   CO2 18* 08/14/2014 2340   GLUCOSE 372* 08/14/2014 2340   BUN 18 08/14/2014 2340   CREATININE 0.56 08/14/2014 2340   CALCIUM 9.9 08/14/2014 2340   PROT 8.5* 08/14/2014 2052   ALBUMIN 4.7 08/14/2014 2052   AST 62* 08/14/2014 2052   ALT 57* 08/14/2014 2052   ALKPHOS 91 08/14/2014 2052   BILITOT 1.4* 08/14/2014 2052   GFRNONAA >90 08/14/2014 2340   GFRAA >90 08/14/2014 2340     Recent Labs Lab 08/14/14 2052  LIPASE 19    No results for input(s): CKTOTAL, CKMB, CKMBINDEX, TROPONINI in the last 168 hours. BNP (last 3 results) No results for input(s): BNP in the last 8760 hours.  ProBNP (last 3 results) No results for input(s): PROBNP in the last 8760 hours.   Radiological Exams on Admission: No results found.  Assessment/Plan Principal Problem:   DKA (diabetic ketoacidoses) Active Problems:   Seizure disorder   Non compliance w medication regimen   DM (diabetes mellitus), type 1, uncontrolled   Gastroparesis due to DM   Chronic abdominal pain   GERD (gastroesophageal reflux disease)   HTN (hypertension)   1. DKA (diabetic ketoacidoses)  the patient is presenting with, as of nausea vomiting and abdominal pain. She is found to have anion  gap acidosis. Most likely secondary to DKA. Most like secondary to noncompliance. Patient is currently on insulin regimen patient has received IV bolus. We will continue to monitor BMP every 4 hours. Transition to regular home insulin once anion gap is corrected.  2. nausea vomiting and abdominal pain. Gastroparesis. Most likely patient's current presentation is due to gastroparesis and noncompliance with medical regimen. Patient is taking Reglan only as needed instead of scheduled. Patient is also on narcotic pain regimens. Patient is also using THC which can lead to to recurrent cyclical nausea vomiting syndrome. Currently we will use scheduled Toradol and home medications for pain management. Place her on IV Reglan. His symptom does not improve she may also benefit from erythromycin. Patient should get a gastric emptying study as an outpatient.  3. history of seizure disorders. Patient is on Depakote and gabapentin. Depakote level in the past has been undetectable multiple times as recent as 07/27/2014. Recheck in Depakote level, and if continues to remain undetectable then the question is of usefulness of continuing it while she is in the hospital.We will reevaluated in the morning.  Advance goals of care discussion: Full code  DVT Prophylaxis: subcutaneous Heparin. Nutrition: Nothing by mouth except medications   Disposition: Admitted toinpatient in telemetry  unit.  Author: Berle Mull, MD [facility] Pager: [phone]  If 7PM-7AM, please contact night-coverage [email or web address] Password [email or web address]

## 2014-08-15 NOTE — Progress Notes (Signed)
[facility]                                                                              Patient Demographics  Mary [name], is a 44 y.o. female, DOB - 02-04-1971, SAY:[id number]  Admit date - 08/14/2014   Admitting Physician Berle Mull, MD  Outpatient Primary MD for the patient is [name], NP  LOS - 1   Chief Complaint  Patient presents with  . Abdominal Pain  . Nausea      HPI on 08/14/2014 by Dr. Berle Mull Mary [name] is a 44 y.o. female with Past medical history of seizure disorder, gastroparesis, diabetes mellitus insulin-dependent noncompliance with medical management, chronic abdominal pain. The patient is presenting with complaints of nausea vomiting and abdominal pain ongoing since early in the morning. She mentions she is compliant with all her medication and order insulin and despite that he started having complains of upper epigastric pain earlier later on started having multiple episodes of nausea and vomiting. She denies any active bleeding. Denies any fever or chills denies any diarrhea or constipation. She mentions that she takes 2 meals a day on a regular day when she is feeling fine. She also mentions that she takes Reglan only as needed instead of scheduled. She denies any recent changes in her medications. Her UDS has been positive for multiple timesTHC  The patient is coming from home And at her baseline independent for most of her ADL.  Assessment & Plan   Diabetic Ketoacidosis/Increased Anion gap acidosis/Uncontrolled Type 1 Diabetes Mellitus -Patient had increased gap 19 upon admission -She was placed on gluco-stabilizer and IVF -Gap has closed, currently 7 -Patient transitioned to SQ lantus and sliding scale with CBG monitoring -She has had several admissions and ER visits since 2014 -Diabetes coordinator consulted and appreciated  Abdominal pain with Nausea/Vomiting -Likely secondary to DKA and gastroparesis/ noncompliance with  medications -Improved, continue antiemetics as needed -Patient placed on IV Reglan  Substance Abuse -UDS +THC -patient counseled  History of seizure disorder -Patient is supposed to be on Depakote, however level <10 -Question if patient is taking her medications -Spoke with neurology and Dr. Nicole Kindred stated that 250mg  BID is a low dose for adult, consider increasing dose to 500mg  BID  Bipolar disorder -Continue risperdal  Hypernatremia/Hyperchloremia -Will change IVF and encourage PO intake -Continue to monitor BMP  Code Status: Full  Family Communication: None at bedside  Disposition Plan: Admitted.  Continue to monitor and treat DKA  Time Spent in minutes   30 minutes  Procedures  None  Consults   Neurology, Dr. Nicole Kindred, via phone  DVT Prophylaxis  Lovenox  Lab Results  Component Value Date   PLT 221 08/14/2014    Medications  Scheduled Meds: . divalproex  250 mg Oral q12n4p  . enoxaparin (LOVENOX) injection  40 mg Subcutaneous QHS  . gabapentin  300 mg Oral TID  . insulin aspart  0-15 Units Subcutaneous TID WC  . insulin aspart  0-5 Units Subcutaneous QHS  . insulin glargine  15 Units Subcutaneous Daily  . ketorolac  15 mg Intravenous 4 times per day  . metoCLOPramide (REGLAN) injection  10 mg Intravenous 4  times per day  . pantoprazole (PROTONIX) IV  40 mg Intravenous Q12H  . polyethylene glycol  17 g Oral Daily  . risperiDONE  2 mg Oral QHS   Continuous Infusions: . sodium chloride 125 mL/hr at 08/14/14 2352  . dextrose 5 % and 0.45% NaCl 125 mL/hr at 08/15/14 1156  . insulin (NOVOLIN-R) infusion 3.4 Units/hr (08/15/14 1205)   PRN Meds:.morphine injection, ondansetron (ZOFRAN) IV, oxyCODONE-acetaminophen  Antibiotics    Anti-infectives    None        Subjective:   Mary [name] seen and examined today.  Patient feels her nausea and vomiting have improved.  She continues to have some pain.  She states she is compliant with her  medications.  Objective:   Filed Vitals:   08/14/14 2300 08/14/14 2318 08/15/14 0613 08/15/14 1238  BP: 137/66 147/87 140/80 143/86  Pulse: 103 107 114 86  Temp:  99 F (37.2 C) 99.1 F (37.3 C) 98.4 F (36.9 C)  TempSrc:  Oral Oral Oral  Resp: 14 16 18 18   Height:      Weight:  52 kg (114 lb 10.2 oz)    SpO2: 99% 100% 97% 100%    Wt Readings from Last 3 Encounters:  08/14/14 52 kg (114 lb 10.2 oz)  08/10/14 56.7 kg (125 lb)  07/26/14 51 kg (112 lb 7 oz)     Intake/Output Summary (Last 24 hours) at 08/15/14 1306 Last data filed at 08/15/14 0644  Gross per 24 hour  Intake 1058.34 ml  Output      0 ml  Net 1058.34 ml    Exam  General: Well developed, well nourished, NAD, appears stated age  HEENT: NCAT, mucous membranes moist.   Cardiovascular: S1 S2 auscultated, no rubs, murmurs or gallops. Regular rate and rhythm.  Respiratory: Clear to auscultation bilaterally with equal chest rise  Abdomen: Soft, nontender, nondistended, + bowel sounds  Extremities: warm dry without cyanosis clubbing or edema  Neuro: AAOx3, nonfocal  Psych: Normal affect and demeanor  Data Review   Micro Results No results found for this or any previous visit (from the past 240 hour(s)).  Radiology Reports Dg Chest 2 View  07/29/2014   CLINICAL DATA:  Fever.  EXAM: CHEST  2 VIEW  COMPARISON:  December 23, 2013.  FINDINGS: The heart size and mediastinal contours are within normal limits. Both lungs are clear. No pneumothorax or pleural effusion is noted. The visualized skeletal structures are unremarkable.  IMPRESSION: No acute cardiopulmonary abnormality seen.   Electronically Signed   By: Marijo Conception, M.D.   On: 07/29/2014 13:09   Ct Abdomen Pelvis W Contrast  07/29/2014   CLINICAL DATA:  Fever and upper abdominal pain. History of urinary tract stones, gastritis stent hepatitis. Patient mid to the hospital with diabetic ketoacidosis.  EXAM: CT ABDOMEN AND PELVIS WITH CONTRAST   TECHNIQUE: Multidetector CT imaging of the abdomen and pelvis was performed using the standard protocol following bolus administration of intravenous contrast.  CONTRAST:  100 mL OMNIPAQUE IOHEXOL 300 MG/ML  SOLN  COMPARISON:  CT abdomen and pelvis 03/08/2014 and 06/26/2013.  FINDINGS: Mild dependent atelectasis is seen in the lung bases. No pleural or pericardial effusion.  The gallbladder has been removed. The liver, spleen, adrenal glands and kidneys are unremarkable. The pancreas appears atrophic with prominence of the pancreatic duct. Atrophy appears progressive compared to the prior exams. No focal lesion is seen.  There is a small volume of free pelvic fluid greater than  seen in physiologic change. Uterus, adnexa and urinary bladder appear normal. The stomach, small and large bowel and appendix appear normal.  Convex left scoliosis is noted. No lytic or sclerotic bony lesion is identified.  IMPRESSION: Small volume of free pelvic fluid is greater than typically seen in physiologic change but nonspecific. It may be related to fluid resuscitation or less likely enteritis. The bowel appears normal on this exam.  Atrophic appearance of the pancreas appears progressive since the prior studies.   Electronically Signed   By: Inge Rise M.D.   On: 07/29/2014 13:03   Dg Abd Acute W/chest  07/26/2014   CLINICAL DATA:  Hyperglycemia.  EXAM: ACUTE ABDOMEN SERIES (ABDOMEN 2 VIEW & CHEST 1 VIEW)  COMPARISON:  None.  FINDINGS: There is no evidence of dilated bowel loops or free intraperitoneal air. Status post cholecystectomy. Moderate levoscoliosis of thoracolumbar junction is noted. No radiopaque calculi or other significant radiographic abnormality is seen. Heart size and mediastinal contours are within normal limits. Both lungs are clear.  IMPRESSION: No evidence of bowel obstruction or ileus. No acute cardiopulmonary disease.   Electronically Signed   By: Marijo Conception, M.D.   On: 07/26/2014 20:02     CBC  Recent Labs Lab 08/14/14 2052  WBC 15.0*  HGB 12.9  HCT 41.0  PLT 221  MCV 71.8*  MCH 22.6*  MCHC 31.5  RDW 15.5  LYMPHSABS 2.3  MONOABS 0.3  EOSABS 0.0  BASOSABS 0.0    Chemistries   Recent Labs Lab 08/14/14 2052 08/14/14 2340 08/15/14 0420 08/15/14 1015  NA 141 146* 146* 146*  K 4.6 4.5 3.8 4.0  CL 106 113* 119* 119*  CO2 16* 18* 19 20  GLUCOSE 468* 372* 215* 143*  BUN 19 18 19 20   CREATININE 0.72 0.56 0.53 0.54  CALCIUM 10.8* 9.9 9.3 9.4  AST 62*  --   --   --   ALT 57*  --   --   --   ALKPHOS 91  --   --   --   BILITOT 1.4*  --   --   --    ------------------------------------------------------------------------------------------------------------------ estimated creatinine clearance is 73.7 mL/min (by C-G formula based on Cr of 0.54). ------------------------------------------------------------------------------------------------------------------ No results for input(s): HGBA1C in the last 72 hours. ------------------------------------------------------------------------------------------------------------------ No results for input(s): CHOL, HDL, LDLCALC, TRIG, CHOLHDL, LDLDIRECT in the last 72 hours. ------------------------------------------------------------------------------------------------------------------ No results for input(s): TSH, T4TOTAL, T3FREE, THYROIDAB in the last 72 hours.  Invalid input(s): FREET3 ------------------------------------------------------------------------------------------------------------------ No results for input(s): VITAMINB12, FOLATE, FERRITIN, TIBC, IRON, RETICCTPCT in the last 72 hours.  Coagulation profile No results for input(s): INR, PROTIME in the last 168 hours.  No results for input(s): DDIMER in the last 72 hours.  Cardiac Enzymes No results for input(s): CKMB, TROPONINI, MYOGLOBIN in the last 168 hours.  Invalid input(s):  CK ------------------------------------------------------------------------------------------------------------------ Invalid input(s): POCBNP    Tyse Auriemma D.O. on 08/15/2014 at 1:06 PM  Between 7am to 7pm - Pager - [phone]  After 7pm go to [email or web address] - password [email or web address]  And look for the night coverage person covering for me after hours  [facility] Office  [phone]

## 2014-08-15 NOTE — Progress Notes (Addendum)
Inpatient Diabetes Program Recommendations  AACE/ADA: New Consensus Statement on Inpatient Glycemic Control (2013)  Target Ranges:  Prepandial:   less than 140 mg/dL      Peak postprandial:   less than 180 mg/dL (1-2 hours)      Critically ill patients:  140 - 180 mg/dL    Results for Mary [name], Mary [name] (MRN [id number]) as of 08/15/2014 11:23  Ref. Range 08/14/2014 20:52  Sodium Latest Range: 135-145 mmol/L 141  Potassium Latest Range: 3.5-5.1 mmol/L 4.6  Chloride Latest Range: 96-112 mmol/L 106  CO2 Latest Range: 19-32 mmol/L 16 (L)  BUN Latest Range: 6-23 mg/dL 19  Creatinine Latest Range: 0.50-1.10 mg/dL 0.72  Calcium Latest Range: 8.4-10.5 mg/dL 10.8 (H)  GFR calc non Af Amer Latest Range: >90 mL/min >90  GFR calc Af Amer Latest Range: >90 mL/min >90  Glucose Latest Range: 70-99 mg/dL 468 (H)  Anion gap Latest Range: 5-15  19 (H)     Admitted with DKA. Not checking CBGs at home, States she is taking insulin.  Patient well known to the Hospitalists and the Inpatient DM Program. Multiple ED visits, Multiple Admissions for DKA, Noncompliance with insulin, etc.   Has been counseled extensively in the past regarding the importance of blood sugar control at home.  Of note, this is patient's 18th hospital admission for Abdominal pain, N&V, Hyperglycemia since January of 2014! Of note, patient has also been seen in the ED 28 times since January 2014 for the same above complaints!  Home DM Meds: Lantus 33 units daily  70/30 insulin- 8 units bidwc   Patient was seen by [name], NP with the [facility] on 08/10/14. No changes were made to her insulin regimen. Patient was instructed and encouraged to check her CBGs more frequently and with more regularity so her insulins can be better adjusted.   Seen by RD at [facility] on 05/18/14 regarding diabetes education.   MD- Note that patient's glucose levels were managed fairly well with  Lantus 33 units daily + Novolog Sensitive SSI during her last admission (07/26/14).  Patient has only received 10 units Lantus this AM (11am) to transition off IV insulin drip.  Not scheduled to get next dose of Lantus (15 units daily) until tomorrow AM (03/01).  May want to go ahead and start patient on larger dose of Lantus today.   Recommend we give patient 15 units Lantus today and start her on Lantus 25 units daily tomorrow AM (03/01).  This would be ~75% of her home dose of Lantus.      Will follow Wyn Quaker RN, MSN, CDE Diabetes Coordinator Inpatient Diabetes Program Team Pager: [phone] (8a-10p)

## 2014-08-16 ENCOUNTER — Other Ambulatory Visit: Payer: Self-pay | Admitting: Internal Medicine

## 2014-08-16 ENCOUNTER — Telehealth: Payer: Self-pay | Admitting: Internal Medicine

## 2014-08-16 LAB — COMPREHENSIVE METABOLIC PANEL
ALT: 45 U/L — ABNORMAL HIGH (ref 0–35)
AST: 49 U/L — ABNORMAL HIGH (ref 0–37)
Albumin: 3.3 g/dL — ABNORMAL LOW (ref 3.5–5.2)
Alkaline Phosphatase: 71 U/L (ref 39–117)
Anion gap: 7 (ref 5–15)
BUN: 17 mg/dL (ref 6–23)
CALCIUM: 9.1 mg/dL (ref 8.4–10.5)
CO2: 22 mmol/L (ref 19–32)
Chloride: 110 mmol/L (ref 96–112)
Creatinine, Ser: 0.55 mg/dL (ref 0.50–1.10)
GFR calc non Af Amer: >90 mL/min (ref >90)
GLUCOSE: 358 mg/dL — AB (ref 70–99)
Potassium: 3.9 mmol/L (ref 3.5–5.1)
Sodium: 139 mmol/L (ref 135–145)
TOTAL PROTEIN: 6.4 g/dL (ref 6.0–8.3)
Total Bilirubin: 0.4 mg/dL (ref 0.3–1.2)

## 2014-08-16 LAB — CBC
HCT: 34.7 % — ABNORMAL LOW (ref 36.0–46.0)
Hemoglobin: 11 g/dL — ABNORMAL LOW (ref 12.0–15.0)
MCH: 23 pg — ABNORMAL LOW (ref 26.0–34.0)
MCHC: 31.7 g/dL (ref 30.0–36.0)
MCV: 72.6 fL — ABNORMAL LOW (ref 78.0–100.0)
PLATELETS: 160 10*3/uL (ref 150–400)
RBC: 4.78 MIL/uL (ref 3.87–5.11)
RDW: 15.7 % — AB (ref 11.5–15.5)
WBC: 12.3 10*3/uL — ABNORMAL HIGH (ref 4.0–10.5)

## 2014-08-16 LAB — GLUCOSE, CAPILLARY
GLUCOSE-CAPILLARY: 211 mg/dL — AB (ref 70–99)
Glucose-Capillary: 316 mg/dL — ABNORMAL HIGH (ref 70–99)
Glucose-Capillary: 161 mg/dL — ABNORMAL HIGH (ref 70–99)
Glucose-Capillary: 210 mg/dL — ABNORMAL HIGH (ref 70–99)

## 2014-08-16 MED ORDER — INSULIN GLARGINE 100 UNIT/ML ~~LOC~~ SOLN
25.0000 [IU] | Freq: Every day | SUBCUTANEOUS | Status: DC
  Administered 2014-08-16: 25 [IU] via SUBCUTANEOUS
  Filled 2014-08-16 (×2): qty 0.25

## 2014-08-16 MED ORDER — HYDRALAZINE HCL 20 MG/ML IJ SOLN: 10.0000 mg | Freq: Four times a day (QID) | INTRAMUSCULAR | Status: DC | PRN

## 2014-08-16 MED ORDER — INSULIN GLARGINE 100 UNIT/ML ~~LOC~~ SOLN
5.0000 [IU] | Freq: Once | SUBCUTANEOUS | Status: AC
  Administered 2014-08-16: 5 [IU] via SUBCUTANEOUS
  Filled 2014-08-16: qty 0.05

## 2014-08-16 MED ORDER — INSULIN GLARGINE 100 UNIT/ML ~~LOC~~ SOLN
30.0000 [IU] | Freq: Every day | SUBCUTANEOUS | Status: DC
  Filled 2014-08-16 (×2): qty 0.3

## 2014-08-16 NOTE — Telephone Encounter (Signed)
Patient called to request a med refill for Lantus Pen. Please f/u with pt.

## 2014-08-16 NOTE — Progress Notes (Addendum)
[facility]                                                                              Patient Demographics  Mary [name], is a 44 y.o. female, DOB - 01/08/1971, XHB:[id number]  Admit date - 08/14/2014   Admitting Physician Berle Mull, MD  Outpatient Primary MD for the patient is [name], NP  LOS - 2   Chief Complaint  Patient presents with  . Abdominal Pain  . Nausea      HPI on 08/14/2014 by Dr. Berle Mull Mary [name] is a 44 y.o. female with Past medical history of seizure disorder, gastroparesis, diabetes mellitus insulin-dependent noncompliance with medical management, chronic abdominal pain. The patient is presenting with complaints of nausea vomiting and abdominal pain ongoing since early in the morning. She mentions she is compliant with all her medication and order insulin and despite that he started having complains of upper epigastric pain earlier later on started having multiple episodes of nausea and vomiting. She denies any active bleeding. Denies any fever or chills denies any diarrhea or constipation. She mentions that she takes 2 meals a day on a regular day when she is feeling fine. She also mentions that she takes Reglan only as needed instead of scheduled. She denies any recent changes in her medications. Her UDS has been positive for multiple timesTHC  The patient is coming from home And at her baseline independent for most of her ADL.  Interim history Patient noted for DKA, and has transitioned to subcutaneous insulin. She has had multiple admissions as well as ER visits in the last year and a half for DKA.  Patient was counseled. Diabetes coronary is consulted and monitoring as well. Suspect a discharge within the next 24-48 hours.  Assessment & Plan   Diabetic Ketoacidosis/Increased Anion gap acidosis/Uncontrolled Type 1 Diabetes Mellitus -Patient had increased gap 19 upon admission but has since closed -She was placed on gluco-stabilizer and  IVF and transitioned to SQ lantus and sliding scale with CBG monitoring -She has had several admissions and ER visits since 2014 -Diabetes coordinator consulted and appreciated -Continue Lantus 30units and ISS   Abdominal pain with Nausea/Vomiting -Likely secondary to DKA and gastroparesis/ noncompliance with medications -Improved, continue antiemetics as needed -Patient placed on IV Reglan but will transition to home dose  Substance Abuse -UDS +THC -patient counseled  History of seizure disorder -Patient is supposed to be on Depakote, however level <10 -Question if patient is taking her medications -On 08/15/2014, Spoke with neurology and Dr. Nicole Kindred stated that 250mg  BID is a low dose for adult, consider increasing dose to 500mg  BID  Bipolar disorder -Continue risperdal  Hypernatremia/Hyperchloremia -Resolved, Will change IVF and encourage PO intake -Continue to monitor BMP  Essential Hypertension -Patient is not on any home medications. Possibly complicated by pain. -Continue to monitor and use hydralazine PRN. -If consistently elevated BP, may consider ACEI at discharge.  Diabetic neuropathy -Continue gabapentin  Elevated LFTs -Possibly due to dehydration -Currently improving -Continue IVF  Code Status: Full  Family Communication: None at bedside  Disposition Plan: Admitted.  Patient continues to have elevated blood sugars, will continue to monitor. Expect discharge in  the next 24-48 hours.  Time Spent in minutes   30 minutes  Procedures  None  Consults   Neurology, Dr. Nicole Kindred, via phone  DVT Prophylaxis  Lovenox  Lab Results  Component Value Date   PLT 160 08/16/2014    Medications  Scheduled Meds: . divalproex  500 mg Oral q12n4p  . enoxaparin (LOVENOX) injection  40 mg Subcutaneous QHS  . gabapentin  300 mg Oral TID  . insulin aspart  0-15 Units Subcutaneous TID WC  . insulin aspart  0-5 Units Subcutaneous QHS  . insulin glargine  30 Units  Subcutaneous Daily  . ketorolac  15 mg Intravenous 4 times per day  . metoCLOPramide (REGLAN) injection  10 mg Intravenous 4 times per day  . pantoprazole (PROTONIX) IV  40 mg Intravenous Q12H  . polyethylene glycol  17 g Oral Daily  . risperiDONE  2 mg Oral QHS   Continuous Infusions: . sodium chloride 75 mL/hr at 08/15/14 1345   PRN Meds:.morphine injection, ondansetron (ZOFRAN) IV, oxyCODONE-acetaminophen  Antibiotics    Anti-infectives    None        Subjective:   Mary [name] seen and examined today.  Patient continues to have some abdominal pain and nausea but states it has improved.  She denies chest pain, SOB.   Objective:   Filed Vitals:   08/15/14 1238 08/15/14 2128 08/16/14 0227 08/16/14 0521  BP: 143/86 121/69 118/74 161/88  Pulse: 86 94 80 82  Temp: 98.4 F (36.9 C) 99.1 F (37.3 C) 98.8 F (37.1 C) 98.8 F (37.1 C)  TempSrc: Oral Tympanic Oral Oral  Resp: 18 18 18 18   Height:      Weight:      SpO2: 100% 96% 98% 100%    Wt Readings from Last 3 Encounters:  08/14/14 52 kg (114 lb 10.2 oz)  08/10/14 56.7 kg (125 lb)  07/26/14 51 kg (112 lb 7 oz)     Intake/Output Summary (Last 24 hours) at 08/16/14 1020 Last data filed at 08/16/14 0600  Gross per 24 hour  Intake 1488.75 ml  Output   2400 ml  Net -911.25 ml    Exam  General: Well developed, well nourished, NAD  HEENT: NCAT, mucous membranes moist.   Cardiovascular: S1 S2 auscultated, RRR, no murmurs  Respiratory: Clear to auscultation bilaterally with equal chest rise  Abdomen: Soft, nontender, nondistended, + bowel sounds  Extremities: No cyanosis clubbing or edema  Neuro: AAOx3, nonfocal  Psych: Normal affect and demeanor  Data Review   Micro Results No results found for this or any previous visit (from the past 240 hour(s)).  Radiology Reports Dg Chest 2 View  07/29/2014   CLINICAL DATA:  Fever.  EXAM: CHEST  2 VIEW  COMPARISON:  December 23, 2013.  FINDINGS: The heart size  and mediastinal contours are within normal limits. Both lungs are clear. No pneumothorax or pleural effusion is noted. The visualized skeletal structures are unremarkable.  IMPRESSION: No acute cardiopulmonary abnormality seen.   Electronically Signed   By: Marijo Conception, M.D.   On: 07/29/2014 13:09   Ct Abdomen Pelvis W Contrast  07/29/2014   CLINICAL DATA:  Fever and upper abdominal pain. History of urinary tract stones, gastritis stent hepatitis. Patient mid to the hospital with diabetic ketoacidosis.  EXAM: CT ABDOMEN AND PELVIS WITH CONTRAST  TECHNIQUE: Multidetector CT imaging of the abdomen and pelvis was performed using the standard protocol following bolus administration of intravenous contrast.  CONTRAST:  100  mL OMNIPAQUE IOHEXOL 300 MG/ML  SOLN  COMPARISON:  CT abdomen and pelvis 03/08/2014 and 06/26/2013.  FINDINGS: Mild dependent atelectasis is seen in the lung bases. No pleural or pericardial effusion.  The gallbladder has been removed. The liver, spleen, adrenal glands and kidneys are unremarkable. The pancreas appears atrophic with prominence of the pancreatic duct. Atrophy appears progressive compared to the prior exams. No focal lesion is seen.  There is a small volume of free pelvic fluid greater than seen in physiologic change. Uterus, adnexa and urinary bladder appear normal. The stomach, small and large bowel and appendix appear normal.  Convex left scoliosis is noted. No lytic or sclerotic bony lesion is identified.  IMPRESSION: Small volume of free pelvic fluid is greater than typically seen in physiologic change but nonspecific. It may be related to fluid resuscitation or less likely enteritis. The bowel appears normal on this exam.  Atrophic appearance of the pancreas appears progressive since the prior studies.   Electronically Signed   By: Inge Rise M.D.   On: 07/29/2014 13:03   Dg Abd Acute W/chest  07/26/2014   CLINICAL DATA:  Hyperglycemia.  EXAM: ACUTE ABDOMEN SERIES  (ABDOMEN 2 VIEW & CHEST 1 VIEW)  COMPARISON:  None.  FINDINGS: There is no evidence of dilated bowel loops or free intraperitoneal air. Status post cholecystectomy. Moderate levoscoliosis of thoracolumbar junction is noted. No radiopaque calculi or other significant radiographic abnormality is seen. Heart size and mediastinal contours are within normal limits. Both lungs are clear.  IMPRESSION: No evidence of bowel obstruction or ileus. No acute cardiopulmonary disease.   Electronically Signed   By: Marijo Conception, M.D.   On: 07/26/2014 20:02    CBC  Recent Labs Lab 08/14/14 2052 08/16/14 0610  WBC 15.0* 12.3*  HGB 12.9 11.0*  HCT 41.0 34.7*  PLT 221 160  MCV 71.8* 72.6*  MCH 22.6* 23.0*  MCHC 31.5 31.7  RDW 15.5 15.7*  LYMPHSABS 2.3  --   MONOABS 0.3  --   EOSABS 0.0  --   BASOSABS 0.0  --     Chemistries   Recent Labs Lab 08/14/14 2052 08/14/14 2340 08/15/14 0420 08/15/14 1015 08/15/14 1420 08/16/14 0610  NA 141 146* 146* 146* 143 139  K 4.6 4.5 3.8 4.0 4.6 3.9  CL 106 113* 119* 119* 119* 110  CO2 16* 18* 19 20 18* 22  GLUCOSE 468* 372* 215* 143* 133* 358*  BUN 19 18 19 20 19 17   CREATININE 0.72 0.56 0.53 0.54 0.57 0.55  CALCIUM 10.8* 9.9 9.3 9.4 9.2 9.1  AST 62*  --   --   --   --  49*  ALT 57*  --   --   --   --  45*  ALKPHOS 91  --   --   --   --  71  BILITOT 1.4*  --   --   --   --  0.4   ------------------------------------------------------------------------------------------------------------------ estimated creatinine clearance is 73.7 mL/min (by C-G formula based on Cr of 0.55). ------------------------------------------------------------------------------------------------------------------ No results for input(s): HGBA1C in the last 72 hours. ------------------------------------------------------------------------------------------------------------------ No results for input(s): CHOL, HDL, LDLCALC, TRIG, CHOLHDL, LDLDIRECT in the last 72  hours. ------------------------------------------------------------------------------------------------------------------ No results for input(s): TSH, T4TOTAL, T3FREE, THYROIDAB in the last 72 hours.  Invalid input(s): FREET3 ------------------------------------------------------------------------------------------------------------------ No results for input(s): VITAMINB12, FOLATE, FERRITIN, TIBC, IRON, RETICCTPCT in the last 72 hours.  Coagulation profile No results for input(s): INR, PROTIME in the last 168 hours.  No results for input(s): DDIMER in the last 72 hours.  Cardiac Enzymes No results for input(s): CKMB, TROPONINI, MYOGLOBIN in the last 168 hours.  Invalid input(s): CK ------------------------------------------------------------------------------------------------------------------ Invalid input(s): POCBNP    Ashok Sawaya D.O. on 08/16/2014 at 10:20 AM  Between 7am to 7pm - Pager - [phone]  After 7pm go to [email or web address] - password [email or web address]  And look for the night coverage person covering for me after hours  [facility] Office  [phone]

## 2014-08-16 NOTE — Progress Notes (Signed)
Inpatient Diabetes Program Recommendations  AACE/ADA: New Consensus Statement on Inpatient Glycemic Control (2013)  Target Ranges:  Prepandial:   less than 140 mg/dL      Peak postprandial:   less than 180 mg/dL (1-2 hours)      Critically ill patients:  140 - 180 mg/dL     Results for Mary [name], Mary [name] (MRN [id number]) as of 08/16/2014 08:57  Ref. Range 08/15/2014 12:03 08/15/2014 13:06 08/15/2014 16:56 08/15/2014 21:09  Glucose-Capillary Latest Range: 70-99 mg/dL 174 (H) 134 (H) 186 (H) 282 (H)    Results for Mary [name], Mary [name] (MRN [id number]) as of 08/16/2014 08:57  Ref. Range 08/16/2014 08:25  Glucose-Capillary Latest Range: 70-99 mg/dL 316 (H)     Current Orders: Lantus 25 units daily     Novolog Moderate SSI tid ac + HS   **Note patient transitioned off IV insulin drip yesterday around 12 noon.  Was given total of 25 units Lantus and started on Moderate SSI.  **Note patient with elevated bedtime glucose level.  Fasting glucose level also elevated this AM.    MD- May want to increase Lantus further to 30 units daily (home dose is Lantus 33 units daily)    Will follow Wyn Quaker RN, MSN, CDE Diabetes Coordinator Inpatient Diabetes Program Team Pager: [phone] (8a-10p)

## 2014-08-16 NOTE — Progress Notes (Signed)
Noticed AM BP. Pt was upset about being woken up for VS and didn't want to be bothered while sleeping. Will continue to monitor.

## 2014-08-16 NOTE — Care Management Note (Addendum)
    Page 1 of 1   08/17/2014     12:30:42 PM CARE MANAGEMENT NOTE 08/17/2014  Patient:  Mary [name], Mary [name]   Account Number:  [id number]  Date Initiated:  08/16/2014  Documentation initiated by:  Dessa Phi  Subjective/Objective Assessment:   44 y/o f admitted w/DKA. Readmit     Action/Plan:   From home.Has pcp, pharmacy.   Anticipated DC Date:  08/17/2014   Anticipated DC Plan:  [place]  CM consult      Choice offered to / List presented to:             Status of service:  Completed, signed off Medicare Important Message given?   (If response is "NO", the following Medicare IM given date fields will be blank) Date Medicare IM given:   Medicare IM given by:   Date Additional Medicare IM given:   Additional Medicare IM given by:    Discharge Disposition:  HOME/SELF CARE  Per UR Regulation:  Reviewed for med. necessity/level of care/duration of stay  If discussed at [place] of Stay Meetings, dates discussed:    Comments:  08/17/14 Dessa Phi RN BSN NCM [phone] d/c home. no further d/c needs.  08/16/14 Dessa Phi RN BSN NCM [phone] Patient states she lost her glucometer.MD please provide hard script for glucometer,strips. She can take to [facility] pharmacy for new one.

## 2014-08-17 LAB — COMPREHENSIVE METABOLIC PANEL
ALT: 35 U/L (ref 0–35)
AST: 42 U/L — AB (ref 0–37)
Albumin: 2.9 g/dL — ABNORMAL LOW (ref 3.5–5.2)
Alkaline Phosphatase: 58 U/L (ref 39–117)
Anion gap: 4 — ABNORMAL LOW (ref 5–15)
BUN: 10 mg/dL (ref 6–23)
CALCIUM: 8.8 mg/dL (ref 8.4–10.5)
CO2: 24 mmol/L (ref 19–32)
CREATININE: 0.55 mg/dL (ref 0.50–1.10)
Chloride: 110 mmol/L (ref 96–112)
GFR calc Af Amer: >90 mL/min (ref >90)
Glucose, Bld: 280 mg/dL — ABNORMAL HIGH (ref 70–99)
Potassium: 4.1 mmol/L (ref 3.5–5.1)
Sodium: 138 mmol/L (ref 135–145)
TOTAL PROTEIN: 5.8 g/dL — AB (ref 6.0–8.3)
Total Bilirubin: 0.6 mg/dL (ref 0.3–1.2)

## 2014-08-17 LAB — CBC
HCT: 32.9 % — ABNORMAL LOW (ref 36.0–46.0)
Hemoglobin: 10.3 g/dL — ABNORMAL LOW (ref 12.0–15.0)
MCH: 22.6 pg — ABNORMAL LOW (ref 26.0–34.0)
MCHC: 31.3 g/dL (ref 30.0–36.0)
MCV: 72.3 fL — ABNORMAL LOW (ref 78.0–100.0)
Platelets: 154 10*3/uL (ref 150–400)
RBC: 4.55 MIL/uL (ref 3.87–5.11)
RDW: 15.5 % (ref 11.5–15.5)
WBC: 10.3 10*3/uL (ref 4.0–10.5)

## 2014-08-17 LAB — GLUCOSE, CAPILLARY: Glucose-Capillary: 274 mg/dL — ABNORMAL HIGH (ref 70–99)

## 2014-08-17 MED ORDER — DIVALPROEX SODIUM 500 MG PO DR TAB: 500.0000 mg | DELAYED_RELEASE_TABLET | Freq: Two times a day (BID) | ORAL | Status: AC

## 2014-08-17 MED ORDER — INSULIN ASPART 100 UNIT/ML ~~LOC~~ SOLN: 4.0000 [IU] | Freq: Three times a day (TID) | SUBCUTANEOUS | Status: DC

## 2014-08-17 MED ORDER — BLOOD GLUCOSE MONITOR KIT: PACK | Status: DC

## 2014-08-17 MED ORDER — INSULIN GLARGINE 100 UNIT/ML SOLOSTAR PEN: 35.0000 [IU] | PEN_INJECTOR | Freq: Every day | SUBCUTANEOUS | Status: DC

## 2014-08-17 MED ORDER — INSULIN GLARGINE 100 UNIT/ML ~~LOC~~ SOLN
35.0000 [IU] | Freq: Every day | SUBCUTANEOUS | Status: DC
  Administered 2014-08-17: 35 [IU] via SUBCUTANEOUS
  Filled 2014-08-17: qty 0.35

## 2014-08-17 NOTE — Progress Notes (Signed)
Patient discharged home, discharge instructions given and explained to patient and she verbalized understanding, denies any pain/distress. Bus pass given for transportation. No wound noted, skin intact.

## 2014-08-17 NOTE — Discharge Planning (Signed)
Physician Discharge Summary  Mary [name] IZT:[id number] DOB: 12-23-1970 DOA: 08/14/2014  PCP: [name], NP  Admit date: 08/14/2014 Discharge date: 08/17/2014  Recommendations for Outpatient Follow-up:  1. Pt will need to follow up [place] 08/26/14_0  2. Please obtain BMP in 1-2 weeks  Discharge Diagnoses:  Diabetic Ketoacidosis/Increased Anion gap acidosis/Uncontrolled Type 1 Diabetes Mellitus -Patient had increased gap 19 upon admission but has since closed -She was placed on gluco-stabilizer and IVF and transitioned to SQ lantus and sliding scale with CBG monitoring -She has had several admissions and ER visits since 2014 -Diabetes coordinator consulted and appreciated -Patient tolerated Lantus without difficulty after transition from intravenous insulin -The patient will go home with Lantus pens 35units daily -she will restart home dose 70/30 insulin 8 units bid  Abdominal pain with Nausea/Vomiting -Likely secondary to DKA and gastroparesis/ noncompliance with medications -Improved, continue antiemetics as needed -Patient placed on IV Reglan but will transition to home dose -Diet was advanced which patient tolerated  Substance Abuse -UDS +THC -patient counseled  History of seizure disorder -Patient is supposed to be on Depakote, however level <10 -Question if patient is taking her medications -On 08/15/2014, Spoke with neurology and Dr. Nicole Kindred stated that 28m BID is a low dose for adult, consider increasing dose to 5043mBID -Patient will go home with Depakote 500 mg twice a day  Bipolar disorder -Continue risperdal -Depakote as discussed above   Hypernatremia/Hyperchloremia -Resolved, Will change IVF and encourage PO intake -Continue to monitor BMP  Essential Hypertension -Patient is not on any home medications. Possibly complicated by pain. -Continue to monitor and use hydralazine PRN. -BP has been very labile. She needs to  follow-up with primary care provider for further evaluation and monitoring  Diabetic neuropathy -Continue gabapentin  Elevated LFTs -Possibly due to dehydration -Currently improving -Continue IVF  Discharge Condition: stable  Disposition: home Follow-up Information    Follow up with [facility]   On 08/26/2014.   Why:  @ 2:30p   Contact information:   [address] [place] [phone]    Diet:carb modified Wt Readings from Last 3 Encounters:  08/14/14 52 kg (114 lb 10.2 oz)  08/10/14 56.7 kg (125 lb)  07/26/14 51 kg (112 lb 7 oz)    History of present illness:  [age].o. female with Past medical history of seizure disorder, gastroparesis, diabetes mellitus insulin-dependent noncompliance with medical management, chronic abdominal pain. The patient is presenting with complaints of nausea vomiting and abdominal pain ongoing since early in the morning. She mentions she is compliant with all her medication and order insulin and despite that he started having complains of upper epigastric pain earlier later on started having multiple episodes of nausea and vomiting. She denies any active bleeding. Denies any fever or chills denies any diarrhea or constipation. She mentions that she takes 2 meals a day on a regular day when she is feeling fine. She also mentions that she takes Reglan only as needed instead of scheduled. She denies any recent changes in her medications. Her UDS has been positive for multiple timesTHC.  The patient's initial anion gap was 19. She was started on intravenous insulin and fluid resuscitated. She improved clinically. She was transitioned to subcutaneous insulin which she tolerated. Her diet was gradually advanced which she tolerated. Her insulin regimen was adjusted to optimize glycemic control. She needs to follow up with her primary care provider for  further adjustments.   Discharge Exam: Filed  Vitals:   08/17/14 0537  BP: 161/92  Pulse: 68  Temp: 98.5 F (36.9 C)  Resp: 19   Filed Vitals:   08/16/14 0521 08/16/14 1514 08/16/14 2123 08/17/14 0537  BP: 161/88 121/76 123/73 161/92  Pulse: 82 88 80 68  Temp: 98.8 F (37.1 C) 98.5 F (36.9 C) 98.5 F (36.9 C) 98.5 F (36.9 C)  TempSrc: Oral Oral Oral Oral  Resp: _0 Height:      Weight:      SpO2: 100% 98% 99% 97%   General: A&O x 3, NAD, pleasant, cooperative Cardiovascular: RRR, no rub, no gallop, no S3 Respiratory: CTAB, no wheeze, no rhonchi Abdomen:soft, nontender, nondistended, positive bowel sounds Extremities: No edema, No lymphangitis, no petechiae  Discharge Instructions      Discharge Instructions    Diet - low sodium heart healthy    Complete by:  As directed      Increase activity slowly    Complete by:  As directed             Medication List    STOP taking these medications        azithromycin 250 MG tablet  Commonly known as:  ZITHROMAX     insulin glargine 100 UNIT/ML injection  Commonly known as:  LANTUS  Replaced by:  Insulin Glargine 100 UNIT/ML Solostar Pen      TAKE these medications        aspirin EC 81 MG tablet  Take 81 mg by mouth daily.     blood glucose meter kit and supplies Kit  Dispense based on patient and insurance preference. Use up to four times daily as directed. (FOR ICD-9 250.00, 250.01).     divalproex 500 MG DR tablet  Commonly known as:  DEPAKOTE  Take 1 tablet (500 mg total) by mouth 2 times daily at 12 noon and 4 pm.     freestyle lancets  Check sugar four times per day for E10.65     gabapentin 300 MG capsule  Commonly known as:  NEURONTIN  Take 300 mg by mouth 3 (three) times daily.     glucose blood test strip  Check blood sugar four times daily for E10.65     Insulin Glargine 100 UNIT/ML Solostar Pen  Commonly known as:  LANTUS SOLOSTAR  Inject 35 Units into the skin daily.     insulin NPH-regular Human (70-30) 100 UNIT/ML  injection  Commonly known as:  NOVOLIN 70/30  Inject 8 Units into the skin 2 (two) times daily with a meal.     Insulin Pen Needle 31G X 8 MM Misc  Commonly known as:  1ST CHOICE PEN NEEDLES  Take insulin nightly for E10.65     INSULIN SYRINGE 1CC/28G 28G X 1/2" 1 ML Misc  Use NPH twice daily for E10.65     medroxyPROGESTERone 150 MG/ML injection  Commonly known as:  DEPO-PROVERA  Inject 150 mg into the muscle every 3 (three) months.     metoCLOPramide 10 MG tablet  Commonly known as:  REGLAN  Take 1 tablet (10 mg total) by mouth 4 (four) times daily -  before meals and at bedtime.     ondansetron 4 MG tablet  Commonly known as:  ZOFRAN  Take 1 tablet (4 mg total) by mouth every 6 (six) hours.     oxyCODONE-acetaminophen 5-325 MG per tablet  Commonly known as:  PERCOCET/ROXICET  Take 1-2  tablets by mouth every 3 (three) hours as needed for moderate pain.     polyethylene glycol packet  Commonly known as:  MIRALAX / GLYCOLAX  Take 17 g by mouth daily as needed.     promethazine 12.5 MG tablet  Commonly known as:  PHENERGAN  Take 1 tablet (12.5 mg total) by mouth every 6 (six) hours as needed for nausea or vomiting.     risperiDONE 2 MG tablet  Commonly known as:  RISPERDAL  Take 2 mg by mouth at bedtime.         The results of significant diagnostics from this hospitalization (including imaging, microbiology, ancillary and laboratory) are listed below for reference.    Significant Diagnostic Studies: Dg Chest 2 View  07/29/2014   CLINICAL DATA:  Fever.  EXAM: CHEST  2 VIEW  COMPARISON:  December 23, 2013.  FINDINGS: The heart size and mediastinal contours are within normal limits. Both lungs are clear. No pneumothorax or pleural effusion is noted. The visualized skeletal structures are unremarkable.  IMPRESSION: No acute cardiopulmonary abnormality seen.   Electronically Signed   By: Marijo Conception, M.D.   On: 07/29/2014 13:09   Ct Abdomen Pelvis W Contrast  07/29/2014    CLINICAL DATA:  Fever and upper abdominal pain. History of urinary tract stones, gastritis stent hepatitis. Patient mid to the hospital with diabetic ketoacidosis.  EXAM: CT ABDOMEN AND PELVIS WITH CONTRAST  TECHNIQUE: Multidetector CT imaging of the abdomen and pelvis was performed using the standard protocol following bolus administration of intravenous contrast.  CONTRAST:  100 mL OMNIPAQUE IOHEXOL 300 MG/ML  SOLN  COMPARISON:  CT abdomen and pelvis 03/08/2014 and 06/26/2013.  FINDINGS: Mild dependent atelectasis is seen in the lung bases. No pleural or pericardial effusion.  The gallbladder has been removed. The liver, spleen, adrenal glands and kidneys are unremarkable. The pancreas appears atrophic with prominence of the pancreatic duct. Atrophy appears progressive compared to the prior exams. No focal lesion is seen.  There is a small volume of free pelvic fluid greater than seen in physiologic change. Uterus, adnexa and urinary bladder appear normal. The stomach, small and large bowel and appendix appear normal.  Convex left scoliosis is noted. No lytic or sclerotic bony lesion is identified.  IMPRESSION: Small volume of free pelvic fluid is greater than typically seen in physiologic change but nonspecific. It may be related to fluid resuscitation or less likely enteritis. The bowel appears normal on this exam.  Atrophic appearance of the pancreas appears progressive since the prior studies.   Electronically Signed   By: Inge Rise M.D.   On: 07/29/2014 13:03   Dg Abd Acute W/chest  07/26/2014   CLINICAL DATA:  Hyperglycemia.  EXAM: ACUTE ABDOMEN SERIES (ABDOMEN 2 VIEW & CHEST 1 VIEW)  COMPARISON:  None.  FINDINGS: There is no evidence of dilated bowel loops or free intraperitoneal air. Status post cholecystectomy. Moderate levoscoliosis of thoracolumbar junction is noted. No radiopaque calculi or other significant radiographic abnormality is seen. Heart size and mediastinal contours are within  normal limits. Both lungs are clear.  IMPRESSION: No evidence of bowel obstruction or ileus. No acute cardiopulmonary disease.   Electronically Signed   By: Marijo Conception, M.D.   On: 07/26/2014 20:02     Microbiology: No results found for this or any previous visit (from the past 240 hour(s)).   Labs: Basic Metabolic Panel:  Recent Labs Lab 08/14/14 2340 08/15/14 0420 08/15/14 1015 08/15/14 1420 08/16/14 9604  08/17/14 0533  NA 146* 146* 146* 143 139 138  K 4.5 3.8 4.0 4.6 3.9 4.1  CL 113* 119* 119* 119* 110 110  CO2 18* 19 20 18* 22 24  GLUCOSE 372* 215* 143* 133* 358* 280*  BUN _0 CREATININE 0.56 0.53 0.54 0.57 0.55 0.55  CALCIUM 9.9 9.3 9.4 9.2 9.1 8.8  PHOS 4.7*  --   --   --   --   --    Liver Function Tests:  Recent Labs Lab 08/14/14 2052 08/16/14 0610 08/17/14 0533  AST 62* 49* 42*  ALT 57* 45* 35  ALKPHOS 91 71 58  BILITOT 1.4* 0.4 0.6  PROT 8.5* 6.4 5.8*  ALBUMIN 4.7 3.3* 2.9*    Recent Labs Lab 08/14/14 2052  LIPASE 19   No results for input(s): AMMONIA in the last 168 hours. CBC:  Recent Labs Lab 08/14/14 2052 08/16/14 0610 08/17/14 0533  WBC 15.0* 12.3* 10.3  NEUTROABS 12.4*  --   --   HGB 12.9 11.0* 10.3*  HCT 41.0 34.7* 32.9*  MCV 71.8* 72.6* 72.3*  PLT 221 160 154   Cardiac Enzymes: No results for input(s): CKTOTAL, CKMB, CKMBINDEX, TROPONINI in the last 168 hours. BNP: Invalid input(s): POCBNP CBG:  Recent Labs Lab 08/16/14 0825 08/16/14 1144 08/16/14 1716 08/16/14 2121 08/17/14 0736  GLUCAP 316* 211* 161* 210* 274*    Time coordinating discharge:  Greater than 30 minutes  Signed:  Magdaleno Lortie, DO [facility] Pager: [phone] 08/17/2014, 9:17 AM

## 2014-08-26 DIAGNOSIS — E1310 Other specified diabetes mellitus with ketoacidosis without coma: Secondary | ICD-10-CM

## 2014-08-26 NOTE — Discharge Summary (Addendum)
Physician Discharge Summary  Mary [name] YKD:[id number] DOB: 04-Mar-1971 DOA: 08/14/2014  PCP: [name], NP  Admit date: 08/14/2014 Discharge date: 08/17/2014 Recommendations for Outpatient Follow-up:  1. Pt will need to follow up [place] 08/26/14_0  2. Please obtain BMP in 1-2 weeks  Discharge Diagnoses:  Diabetic Ketoacidosis/Increased Anion gap acidosis/Uncontrolled Type 1 Diabetes Mellitus -Patient had increased gap 19 upon admission but has since closed -She was placed on gluco-stabilizer and IVF and transitioned to SQ lantus and sliding scale with CBG monitoring -She has had several admissions and ER visits since 2014 -Diabetes coordinator consulted and appreciated -Patient tolerated Lantus without difficulty after transition from intravenous insulin -The patient will go home with Lantus pens 35units daily -she will restart home dose 70/30 insulin 8 units bid  Abdominal pain with Nausea/Vomiting -Likely secondary to DKA and gastroparesis/ noncompliance with medications -Improved, continue antiemetics as needed -Patient placed on IV Reglan but will transition to home dose -Diet was advanced which patient tolerated  Substance Abuse -UDS +THC -patient counseled  History of seizure disorder -Patient is supposed to be on Depakote, however level <10 -Question if patient is taking her medications -On 08/15/2014, Spoke with neurology and Dr. Nicole Kindred stated that 231m BID is a low dose for adult, consider increasing dose to 5068mBID -Patient will go home with Depakote 500 mg twice a day  Bipolar disorder -Continue risperdal -Depakote as discussed above   Hypernatremia/Hyperchloremia -Resolved, Will change IVF and encourage PO intake -Continue to monitor BMP  Essential Hypertension -Patient is not on any home medications. Possibly complicated by pain. -Continue to monitor and use hydralazine PRN. -BP has been very labile. She needs to  follow-up with primary care provider for further evaluation and monitoring  Diabetic neuropathy -Continue gabapentin  Elevated LFTs -Possibly due to dehydration -Currently improving -Continue IVF  Discharge Condition: stable  Disposition: home Follow-up Information    Follow up [facility]   On 08/26/2014.   Why:  @ 2:30p   Contact information:   [address] [place] [phone]    Follow up with [facility] On 08/17/2014.   Specialty:  Internal Medicine   Contact information:   [place] [phone]     Diet:carb modified Wt Readings from Last 3 Encounters:  08/14/14 52 kg (114 lb 10.2 oz)  08/10/14 56.7 kg (125 lb)  07/26/14 51 kg (112 lb 7 oz)    History of present illness:  [age].o. female with Past medical history of seizure disorder, gastroparesis, diabetes mellitus insulin-dependent noncompliance with medical management, chronic abdominal pain. The patient is presenting with complaints of nausea vomiting and abdominal pain ongoing since early in the morning. She mentions she is compliant with all her medication and order insulin and despite that he started having complains of upper epigastric pain earlier later on started having multiple episodes of nausea and vomiting. She denies any active bleeding. Denies any fever or chills denies any diarrhea or constipation. She mentions that she takes 2 meals a day on a regular day when she is feeling fine. She also mentions that she takes Reglan only as needed instead of scheduled. She denies any recent changes in her medications. Her UDS has been positive for multiple timesTHC.  The patient's initial anion gap was 19. She was started on intravenous insulin and fluid resuscitated. She improved clinically. She was transitioned to  subcutaneous insulin which she tolerated. Her diet was gradually advanced which she tolerated.  Her insulin regimen was adjusted to optimize glycemic control. She needs to follow up with her primary care provider for further adjustments.   Discharge Exam: Filed Vitals:   08/17/14 0537  BP: 161/92  Pulse: 68  Temp: 98.5 F (36.9 C)  Resp: 19   Filed Vitals:   08/16/14 0521 08/16/14 1514 08/16/14 2123 08/17/14 0537  BP: 161/88 121/76 123/73 161/92  Pulse: 82 88 80 68  Temp: 98.8 F (37.1 C) 98.5 F (36.9 C) 98.5 F (36.9 C) 98.5 F (36.9 C)  TempSrc: Oral Oral Oral Oral  Resp: _0 Height:      Weight:      SpO2: 100% 98% 99% 97%   General: A&O x 3, NAD, pleasant, cooperative Cardiovascular: RRR, no rub, no gallop, no S3 Respiratory: CTAB, no wheeze, no rhonchi Abdomen:soft, nontender, nondistended, positive bowel sounds Extremities: No edema, No lymphangitis, no petechiae  Discharge Instructions      Discharge Instructions    Diet - low sodium heart healthy    Complete by:  As directed      Increase activity slowly    Complete by:  As directed             Medication List    STOP taking these medications        azithromycin 250 MG tablet  Commonly known as:  ZITHROMAX     insulin glargine 100 UNIT/ML injection  Commonly known as:  LANTUS  Replaced by:  Insulin Glargine 100 UNIT/ML Solostar Pen      TAKE these medications        aspirin EC 81 MG tablet  Take 81 mg by mouth daily.     blood glucose meter kit and supplies Kit  Dispense based on patient and insurance preference. Use up to four times daily as directed. (FOR ICD-9 250.00, 250.01).     divalproex 500 MG DR tablet  Commonly known as:  DEPAKOTE  Take 1 tablet (500 mg total) by mouth 2 times daily at 12 noon and 4 pm.     freestyle lancets  Check sugar four times per day for E10.65     gabapentin 300 MG capsule  Commonly known as:  NEURONTIN  Take 300 mg by mouth 3 (three) times daily.     glucose blood test strip  Check blood sugar four times daily for E10.65      Insulin Glargine 100 UNIT/ML Solostar Pen  Commonly known as:  LANTUS SOLOSTAR  Inject 35 Units into the skin daily.     insulin NPH-regular Human (70-30) 100 UNIT/ML injection  Commonly known as:  NOVOLIN 70/30  Inject 8 Units into the skin 2 (two) times daily with a meal.     Insulin Pen Needle 31G X 8 MM Misc  Commonly known as:  1ST CHOICE PEN NEEDLES  Take insulin nightly for E10.65     INSULIN SYRINGE 1CC/28G 28G X 1/2" 1 ML Misc  Use NPH twice daily for E10.65     medroxyPROGESTERone 150 MG/ML injection  Commonly known as:  DEPO-PROVERA  Inject 150 mg into the muscle every 3 (three) months.     metoCLOPramide 10 MG tablet  Commonly known as:  REGLAN  Take 1 tablet (10 mg total) by mouth 4 (four) times daily -  before meals and at bedtime.     ondansetron 4 MG tablet  Commonly known as:  ZOFRAN  Take 1 tablet (4 mg total) by mouth every 6 (six) hours.     oxyCODONE-acetaminophen 5-325 MG per tablet  Commonly known as:  PERCOCET/ROXICET  Take 1-2 tablets by mouth every 3 (three) hours as needed for moderate pain.     polyethylene glycol packet  Commonly known as:  MIRALAX / GLYCOLAX  Take 17 g by mouth daily as needed.     promethazine 12.5 MG tablet  Commonly known as:  PHENERGAN  Take 1 tablet (12.5 mg total) by mouth every 6 (six) hours as needed for nausea or vomiting.     risperiDONE 2 MG tablet  Commonly known as:  RISPERDAL  Take 2 mg by mouth at bedtime.         The results of significant diagnostics from this hospitalization (including imaging, microbiology, ancillary and laboratory) are listed below for reference.    Significant Diagnostic Studies: Dg Chest 2 View  07/29/2014   CLINICAL DATA:  Fever.  EXAM: CHEST  2 VIEW  COMPARISON:  December 23, 2013.  FINDINGS: The heart size and mediastinal contours are within normal limits. Both lungs are clear. No pneumothorax or pleural effusion is noted. The visualized skeletal structures are unremarkable.   IMPRESSION: No acute cardiopulmonary abnormality seen.   Electronically Signed   By: Marijo Conception, M.D.   On: 07/29/2014 13:09   Ct Abdomen Pelvis W Contrast  07/29/2014   CLINICAL DATA:  Fever and upper abdominal pain. History of urinary tract stones, gastritis stent hepatitis. Patient mid to the hospital with diabetic ketoacidosis.  EXAM: CT ABDOMEN AND PELVIS WITH CONTRAST  TECHNIQUE: Multidetector CT imaging of the abdomen and pelvis was performed using the standard protocol following bolus administration of intravenous contrast.  CONTRAST:  100 mL OMNIPAQUE IOHEXOL 300 MG/ML  SOLN  COMPARISON:  CT abdomen and pelvis 03/08/2014 and 06/26/2013.  FINDINGS: Mild dependent atelectasis is seen in the lung bases. No pleural or pericardial effusion.  The gallbladder has been removed. The liver, spleen, adrenal glands and kidneys are unremarkable. The pancreas appears atrophic with prominence of the pancreatic duct. Atrophy appears progressive compared to the prior exams. No focal lesion is seen.  There is a small volume of free pelvic fluid greater than seen in physiologic change. Uterus, adnexa and urinary bladder appear normal. The stomach, small and large bowel and appendix appear normal.  Convex left scoliosis is noted. No lytic or sclerotic bony lesion is identified.  IMPRESSION: Small volume of free pelvic fluid is greater than typically seen in physiologic change but nonspecific. It may be related to fluid resuscitation or less likely enteritis. The bowel appears normal on this exam.  Atrophic appearance of the pancreas appears progressive since the prior studies.   Electronically Signed   By: Inge Rise M.D.   On: 07/29/2014 13:03     Microbiology: No results found for this or any previous visit (from the past 240 hour(s)).   Labs: Basic Metabolic Panel: No results for input(s): NA, K, CL, CO2, GLUCOSE, BUN, CREATININE, CALCIUM, MG, PHOS in the last 168 hours. Liver Function Tests: No  results for input(s): AST, ALT, ALKPHOS, BILITOT, PROT, ALBUMIN in the last 168 hours. No results for input(s): LIPASE, AMYLASE in the last 168 hours. No results for input(s): AMMONIA in the last 168 hours. CBC: No results for input(s): WBC, NEUTROABS, HGB, HCT, MCV, PLT in the last 168 hours. Cardiac Enzymes: No results for input(s): CKTOTAL, CKMB, CKMBINDEX, TROPONINI in the last 168 hours.  BNP: Invalid input(s): POCBNP CBG: No results for input(s): GLUCAP in the last 168 hours.  Time coordinating discharge:  Greater than 30 minutes  Signed:  Kamila Broda, DO [facility] Pager: [phone] 08/26/2014, 9:17 AM

## 2014-09-03 ENCOUNTER — Encounter (HOSPITAL_COMMUNITY): Payer: Self-pay | Admitting: Emergency Medicine

## 2014-09-03 ENCOUNTER — Emergency Department (HOSPITAL_COMMUNITY)
Admission: EM | Admit: 2014-09-03 | Discharge: 2014-09-03 | Disposition: A | Discharge status: Home / Self Care | Payer: Self-pay | Attending: Emergency Medicine | Admitting: Emergency Medicine

## 2014-09-03 DIAGNOSIS — K3184 Gastroparesis: Secondary | ICD-10-CM | POA: Insufficient documentation

## 2014-09-03 DIAGNOSIS — F3181 Bipolar II disorder: Secondary | ICD-10-CM | POA: Insufficient documentation

## 2014-09-03 DIAGNOSIS — Z87442 Personal history of urinary calculi: Secondary | ICD-10-CM | POA: Insufficient documentation

## 2014-09-03 DIAGNOSIS — R109 Unspecified abdominal pain: Secondary | ICD-10-CM

## 2014-09-03 DIAGNOSIS — Z79899 Other long term (current) drug therapy: Secondary | ICD-10-CM | POA: Insufficient documentation

## 2014-09-03 DIAGNOSIS — Z794 Long term (current) use of insulin: Secondary | ICD-10-CM | POA: Insufficient documentation

## 2014-09-03 DIAGNOSIS — G8929 Other chronic pain: Secondary | ICD-10-CM | POA: Insufficient documentation

## 2014-09-03 DIAGNOSIS — E1040 Type 1 diabetes mellitus with diabetic neuropathy, unspecified: Secondary | ICD-10-CM | POA: Insufficient documentation

## 2014-09-03 DIAGNOSIS — I10 Essential (primary) hypertension: Secondary | ICD-10-CM | POA: Insufficient documentation

## 2014-09-03 DIAGNOSIS — G40909 Epilepsy, unspecified, not intractable, without status epilepticus: Secondary | ICD-10-CM | POA: Insufficient documentation

## 2014-09-03 DIAGNOSIS — M1990 Unspecified osteoarthritis, unspecified site: Secondary | ICD-10-CM | POA: Insufficient documentation

## 2014-09-03 DIAGNOSIS — Z862 Personal history of diseases of the blood and blood-forming organs and certain disorders involving the immune mechanism: Secondary | ICD-10-CM | POA: Insufficient documentation

## 2014-09-03 DIAGNOSIS — Z8619 Personal history of other infectious and parasitic diseases: Secondary | ICD-10-CM | POA: Insufficient documentation

## 2014-09-03 DIAGNOSIS — Z880 Allergy status to penicillin: Secondary | ICD-10-CM | POA: Insufficient documentation

## 2014-09-03 DIAGNOSIS — R011 Cardiac murmur, unspecified: Secondary | ICD-10-CM | POA: Insufficient documentation

## 2014-09-03 LAB — CBC WITH DIFFERENTIAL/PLATELET
Basophils Absolute: 0 10*3/uL (ref 0.0–0.1)
Basophils Relative: 0 % (ref 0–1)
EOS PCT: 0 % (ref 0–5)
Eosinophils Absolute: 0 10*3/uL (ref 0.0–0.7)
HEMATOCRIT: 36.4 % (ref 36.0–46.0)
HEMOGLOBIN: 11.6 g/dL — AB (ref 12.0–15.0)
LYMPHS ABS: 2.3 10*3/uL (ref 0.7–4.0)
LYMPHS PCT: 24 % (ref 12–46)
MCH: 23.4 pg — ABNORMAL LOW (ref 26.0–34.0)
MCHC: 31.9 g/dL (ref 30.0–36.0)
MCV: 73.4 fL — ABNORMAL LOW (ref 78.0–100.0)
MONO ABS: 0.4 10*3/uL (ref 0.1–1.0)
Monocytes Relative: 4 % (ref 3–12)
NEUTROS ABS: 6.6 10*3/uL (ref 1.7–7.7)
Neutrophils Relative %: 72 % (ref 43–77)
Platelets: 162 10*3/uL (ref 150–400)
RBC: 4.96 MIL/uL (ref 3.87–5.11)
RDW: 16.3 % — AB (ref 11.5–15.5)
WBC: 9.3 10*3/uL (ref 4.0–10.5)

## 2014-09-03 LAB — PREGNANCY, URINE: Preg Test, Ur: NEGATIVE

## 2014-09-03 LAB — URINALYSIS, ROUTINE W REFLEX MICROSCOPIC
BILIRUBIN URINE: NEGATIVE
Glucose, UA: NEGATIVE mg/dL
Hgb urine dipstick: NEGATIVE
Ketones, ur: 15 mg/dL — AB
LEUKOCYTES UA: NEGATIVE
Nitrite: NEGATIVE
PH: 6 (ref 5.0–8.0)
Protein, ur: NEGATIVE mg/dL
Specific Gravity, Urine: 1.021 (ref 1.005–1.030)
UROBILINOGEN UA: 1 mg/dL (ref 0.0–1.0)

## 2014-09-03 LAB — COMPREHENSIVE METABOLIC PANEL
ALT: 46 U/L — AB (ref 0–35)
ANION GAP: 7 (ref 5–15)
AST: 68 U/L — AB (ref 0–37)
Albumin: 3.5 g/dL (ref 3.5–5.2)
Alkaline Phosphatase: 68 U/L (ref 39–117)
BUN: 18 mg/dL (ref 6–23)
CALCIUM: 8.7 mg/dL (ref 8.4–10.5)
CHLORIDE: 110 mmol/L (ref 96–112)
CO2: 22 mmol/L (ref 19–32)
Creatinine, Ser: 0.41 mg/dL — ABNORMAL LOW (ref 0.50–1.10)
GFR calc Af Amer: >90 mL/min (ref >90)
GFR calc non Af Amer: >90 mL/min (ref >90)
Glucose, Bld: 132 mg/dL — ABNORMAL HIGH (ref 70–99)
POTASSIUM: 3.5 mmol/L (ref 3.5–5.1)
Sodium: 139 mmol/L (ref 135–145)
TOTAL PROTEIN: 6.8 g/dL (ref 6.0–8.3)
Total Bilirubin: 0.9 mg/dL (ref 0.3–1.2)

## 2014-09-03 LAB — LIPASE, BLOOD: LIPASE: 18 U/L (ref 11–59)

## 2014-09-03 MED ORDER — PANTOPRAZOLE SODIUM 40 MG IV SOLR
40.0000 mg | Freq: Once | INTRAVENOUS | Status: AC
  Administered 2014-09-03: 40 mg via INTRAVENOUS
  Filled 2014-09-03: qty 40

## 2014-09-03 MED ORDER — SODIUM CHLORIDE 0.9 % IV BOLUS (SEPSIS)
1000.0000 mL | Freq: Once | INTRAVENOUS | Status: AC
  Administered 2014-09-03: 1000 mL via INTRAVENOUS

## 2014-09-03 MED ORDER — HYDROMORPHONE HCL 1 MG/ML IJ SOLN
1.0000 mg | Freq: Once | INTRAMUSCULAR | Status: AC
  Administered 2014-09-03: 1 mg via INTRAVENOUS
  Filled 2014-09-03: qty 1

## 2014-09-03 MED ORDER — METOCLOPRAMIDE HCL 5 MG/ML IJ SOLN
10.0000 mg | Freq: Once | INTRAMUSCULAR | Status: AC
  Administered 2014-09-03: 10 mg via INTRAVENOUS
  Filled 2014-09-03: qty 2

## 2014-09-03 MED ORDER — METOCLOPRAMIDE HCL 10 MG PO TABS: 10.0000 mg | ORAL_TABLET | Freq: Four times a day (QID) | ORAL | Status: DC

## 2014-09-03 NOTE — ED Notes (Signed)
Bed: CV89 Expected date:  Expected time:  Means of arrival:  Comments: n/v

## 2014-09-03 NOTE — ED Notes (Signed)
Pt sts that she is still unable to urinate.

## 2014-09-03 NOTE — ED Provider Notes (Signed)
CSN: [id number]     Arrival date & time 09/03/14  1248 History   First MD Initiated Contact with Patient 09/03/14 1313     Chief Complaint  Patient presents with  . Abdominal Pain     (Consider location/radiation/quality/duration/timing/severity/associated sxs/prior Treatment) HPI Patient is a 44 year old female past medical history of gastritis, diabetes, diabetic gastroparesis, chronic abdominal pain who presents the ER complaining of abdominal pain. Patient reports 2 days of upper abdominal pain with nausea, vomiting associated. Patient states she has been unable to keep down food or fluids. Patient is noted to have a history of gastroparesis with recent admission to the hospital and discharged on 08/26/14 for DKA and gastroparesis. Patient states her pain is identical to episodes of gastroparesis she has experienced in the past, consistent with her chronic abdominal pain. Patient denies any weakness, headache, blurred vision, neck pain, chest pain, shortness of breath, vaginal discharge, vaginal bleeding, dysuria.  Past Medical History  Diagnosis Date  . Kidney stone   . Seizure disorder     started with pregnancy of first son  . Gastritis   . Bipolar 2 disorder   . Post traumatic stress disorder     "flipping out" after people close to her died  . Heart murmur   . Arthritis     r ankle-S/P surgery (2007)  . Anemia     childhood  . Depression     childhood  . Hepatitis C     biopsy in 2010-no rx-was supposed to see a hepatologist in [place].  With cirrhosis  . Neuropathy     in legs and feet and hands  . Cataracts, bilateral   . Scoliosis   . Diabetes mellitus     diagnosed in 1996-always been on insulin  . DKA (diabetic ketoacidoses)     Recurrent admissions for DKA, medication non-complaince, poor social situation  . Diabetic neuropathy, type I diabetes mellitus     numbness bilaterally feet  . HTN (hypertension) 10/12/2013   Past Surgical History  Procedure  Laterality Date  . Ankle surgery  2007  . Endometrial biopsy  06/22/2012  . Cholecystectomy  04/07/2013  . Cholecystectomy N/A 04/07/2013    Procedure: LAPAROSCOPIC CHOLECYSTECTOMY WITH INTRAOPERATIVE CHOLANGIOGRAM;  Surgeon: Adin Hector, MD;  Location: [facility];  Service: General;  Laterality: N/A;  . Cesarean section      3 times   Family History  Problem Relation Age of Onset  . Diabetes Mother     currently 68  . Fibromyalgia Mother   . Cirrhosis Father     died in 35  . Diabetes Maternal Grandmother   . Diabetes Maternal Aunt    History  Substance Use Topics  . Smoking status: Current Every Day Smoker -- 0.10 packs/day for 25 years    Types: Cigarettes  . Smokeless tobacco: Never Used  . Alcohol Use: No     Comment: Endorses hasn't been drinking   OB History    Gravida Para Term Preterm AB TAB SAB Ectopic Multiple Living   _0 0 1 0 1 0 1 2     Review of Systems  Constitutional: Negative for fever.  HENT: Negative for trouble swallowing.   Eyes: Negative for visual disturbance.  Respiratory: Negative for shortness of breath.   Cardiovascular: Negative for chest pain.  Gastrointestinal: Positive for nausea, vomiting and abdominal pain.  Genitourinary: Negative for dysuria.  Musculoskeletal: Negative for neck pain.  Skin: Negative for rash.  Neurological: Negative for  dizziness, weakness and numbness.  Psychiatric/Behavioral: Negative.       Allergies  Penicillins  Home Medications   Prior to Admission medications   Medication Sig Start Date End Date Taking? Authorizing Provider  aspirin EC 81 MG tablet Take 81 mg by mouth daily.   Yes Historical Provider, MD  divalproex (DEPAKOTE) 500 MG DR tablet Take 1 tablet (500 mg total) by mouth 2 times daily at 12 noon and 4 pm. 08/17/14  Yes Orson Eva, MD  gabapentin (NEURONTIN) 300 MG capsule Take 300 mg by mouth 3 (three) times daily.   Yes Historical Provider, MD  Insulin Glargine (LANTUS SOLOSTAR) 100  UNIT/ML Solostar Pen Inject 35 Units into the skin daily. 08/17/14  Yes Orson Eva, MD  insulin NPH-regular Human (NOVOLIN 70/30) (70-30) 100 UNIT/ML injection Inject 8 Units into the skin 2 (two) times daily with a meal. 07/31/14  Yes Costin Karlyne Greenspan, MD  medroxyPROGESTERone (DEPO-PROVERA) 150 MG/ML injection Inject 150 mg into the muscle every 3 (three) months.   Yes Historical Provider, MD  metoCLOPramide (REGLAN) 10 MG tablet Take 1 tablet (10 mg total) by mouth 4 (four) times daily -  before meals and at bedtime. 07/31/14  Yes Costin Karlyne Greenspan, MD  risperiDONE (RISPERDAL) 2 MG tablet Take 2 mg by mouth at bedtime.   Yes Historical Provider, MD  blood glucose meter kit and supplies KIT Dispense based on patient and insurance preference. Use up to four times daily as directed. (FOR ICD-9 250.00, 250.01). 08/17/14   Orson Eva, MD  glucose blood test strip Check blood sugar four times daily for E10.65 08/10/14   Lance Bosch, NP  Insulin Pen Needle (1ST CHOICE PEN NEEDLES) 31G X 8 MM MISC Take insulin nightly for E10.65 08/10/14   Lance Bosch, NP  Insulin Syringe-Needle U-100 (INSULIN SYRINGE 1CC/28G) 28G X 1/2" 1 ML MISC Use NPH twice daily for E10.65 08/10/14   Lance Bosch, NP  Lancets (FREESTYLE) lancets Check sugar four times per day for E10.65 08/10/14   Lance Bosch, NP  metoCLOPramide (REGLAN) 10 MG tablet Take 1 tablet (10 mg total) by mouth every 6 (six) hours. 09/03/14   Dahlia Bailiff, PA-C  ondansetron (ZOFRAN) 4 MG tablet Take 1 tablet (4 mg total) by mouth every 6 (six) hours. Patient not taking: Reported on 09/03/2014 06/08/14   Theodis Blaze, MD  oxyCODONE-acetaminophen (PERCOCET/ROXICET) 5-325 MG per tablet Take 1-2 tablets by mouth every 3 (three) hours as needed for moderate pain. Patient not taking: Reported on 09/03/2014 07/31/14   Caren Griffins, MD  polyethylene glycol (MIRALAX / Floria Raveling) packet Take 17 g by mouth daily as needed. Patient not taking: Reported on 09/03/2014 07/31/14    Caren Griffins, MD  promethazine (PHENERGAN) 12.5 MG tablet Take 1 tablet (12.5 mg total) by mouth every 6 (six) hours as needed for nausea or vomiting. Patient not taking: Reported on 08/10/2014 06/08/14   Theodis Blaze, MD   BP 137/91 mmHg  Pulse 90  Temp(Src) 98 F (36.7 C) (Oral)  Resp 20  SpO2 100% Physical Exam  Constitutional: She is oriented to person, place, and time. She appears well-developed and well-nourished. No distress.  HENT:  Head: Normocephalic and atraumatic.  Mouth/Throat: Oropharynx is clear and moist. No oropharyngeal exudate.  Eyes: Right eye exhibits no discharge. Left eye exhibits no discharge. No scleral icterus.  Neck: Normal range of motion.  Cardiovascular: Normal rate, regular rhythm and normal heart sounds.   No  murmur heard. Pulmonary/Chest: Effort normal and breath sounds normal. No respiratory distress.  Abdominal: Soft. There is tenderness in the epigastric area and left upper quadrant. There is no rigidity, no guarding, no tenderness at McBurney's point and negative Murphy's sign.  Musculoskeletal: Normal range of motion. She exhibits no edema or tenderness.  Neurological: She is alert and oriented to person, place, and time. No cranial nerve deficit or sensory deficit. Coordination normal. GCS eye subscore is 4. GCS verbal subscore is 5. GCS motor subscore is 6.  Skin: Skin is warm and dry. No rash noted. She is not diaphoretic.  Psychiatric: She has a normal mood and affect.  Nursing note and vitals reviewed.   ED Course  Procedures (including critical care time) Labs Review Labs Reviewed  CBC WITH DIFFERENTIAL/PLATELET - Abnormal; Notable for the following:    Hemoglobin 11.6 (*)    MCV 73.4 (*)    MCH 23.4 (*)    RDW 16.3 (*)    All other components within normal limits  COMPREHENSIVE METABOLIC PANEL - Abnormal; Notable for the following:    Glucose, Bld 132 (*)    Creatinine, Ser 0.41 (*)    AST 68 (*)    ALT 46 (*)    All other  components within normal limits  URINALYSIS, ROUTINE W REFLEX MICROSCOPIC - Abnormal; Notable for the following:    APPearance CLOUDY (*)    Ketones, ur 15 (*)    All other components within normal limits  LIPASE, BLOOD  PREGNANCY, URINE    Imaging Review No results found.   EKG Interpretation None      MDM   Final diagnoses:  Gastroparesis  Chronic abdominal pain    Patient here signs and symptoms consistent with her chronic abdominal pain/gastroparesis. Patient's symptoms improved remarkably to a tolerable level after therapy here. Evaluation of lab work reveals no evidence of DKA. Patient's lab work appears to be at baseline for her. Patient is nontoxic, nonseptic appearing, in no apparent distress.  Patient's pain and other symptoms adequately managed in emergency department.  Fluid bolus given.  Labs, imaging and vitals reviewed.  Patient does not meet the SIRS or Sepsis criteria.  On repeat exam patient does not have a surgical abdomin and there are no peritoneal signs.  No indication of appendicitis, bowel obstruction, bowel perforation, cholecystitis, diverticulitis, PID or ectopic pregnancy.  Patient discharged home with symptomatic treatment and given strict instructions for follow-up with their primary care physician.  I have also discussed reasons to return immediately to the ER.  Patient expresses understanding and agrees with plan. I encouraged patient to call or to the ER should she have any questions or concerns.  BP 137/91 mmHg  Pulse 90  Temp(Src) 98 F (36.7 C) (Oral)  Resp 20  SpO2 100%  Signed,  Dahlia Bailiff, PA-C 6:40 PM      Dahlia Bailiff, PA-C 09/03/14 [place], MD 09/03/14 2013

## 2014-09-03 NOTE — ED Notes (Signed)
Reminded patient that we need urine specimen, states she cannot go at this time. Will reassess

## 2014-09-03 NOTE — Discharge Instructions (Signed)

## 2014-09-03 NOTE — ED Notes (Signed)
Bed: XE94 Expected date: 09/03/14 Expected time: 12:44 PM Means of arrival: Ambulance Comments: Kidney stone

## 2014-09-03 NOTE — ED Notes (Signed)
Patient is aware that a urine specimen is needed-unable to give a specimen at this time.

## 2014-09-03 NOTE — ED Notes (Signed)
Pt from home via EMS c/o generalized abd pain. Per EMS- pt c/o N/V, no diarrhea. Pt has hx of chronic gastritis. Pt is A&O and ambulatory per EMS

## 2014-09-03 NOTE — ED Notes (Signed)
Attempted to draw labs thru IV w/o success. Phlebotomy at bedside for labs

## 2014-09-03 NOTE — ED Notes (Signed)
Pt sts she has chronic abd pain that started this am with nausea/emesis. Pt sts she is dehydrated. Pt is A&O and in NAD

## 2014-09-27 ENCOUNTER — Encounter (HOSPITAL_COMMUNITY): Payer: Self-pay | Admitting: Emergency Medicine

## 2014-09-27 ENCOUNTER — Emergency Department (HOSPITAL_COMMUNITY)
Admission: EM | Admit: 2014-09-27 | Discharge: 2014-09-27 | Disposition: A | Discharge status: Home / Self Care | Payer: Self-pay | Attending: Emergency Medicine | Admitting: Emergency Medicine

## 2014-09-27 DIAGNOSIS — K3184 Gastroparesis: Secondary | ICD-10-CM | POA: Insufficient documentation

## 2014-09-27 DIAGNOSIS — Z720 Tobacco use: Secondary | ICD-10-CM | POA: Insufficient documentation

## 2014-09-27 DIAGNOSIS — R739 Hyperglycemia, unspecified: Secondary | ICD-10-CM

## 2014-09-27 DIAGNOSIS — Z9889 Other specified postprocedural states: Secondary | ICD-10-CM | POA: Insufficient documentation

## 2014-09-27 DIAGNOSIS — F3181 Bipolar II disorder: Secondary | ICD-10-CM | POA: Insufficient documentation

## 2014-09-27 DIAGNOSIS — M19071 Primary osteoarthritis, right ankle and foot: Secondary | ICD-10-CM | POA: Insufficient documentation

## 2014-09-27 DIAGNOSIS — R109 Unspecified abdominal pain: Secondary | ICD-10-CM

## 2014-09-27 DIAGNOSIS — Z7982 Long term (current) use of aspirin: Secondary | ICD-10-CM | POA: Insufficient documentation

## 2014-09-27 DIAGNOSIS — R1084 Generalized abdominal pain: Secondary | ICD-10-CM | POA: Insufficient documentation

## 2014-09-27 DIAGNOSIS — Z880 Allergy status to penicillin: Secondary | ICD-10-CM | POA: Insufficient documentation

## 2014-09-27 DIAGNOSIS — R011 Cardiac murmur, unspecified: Secondary | ICD-10-CM | POA: Insufficient documentation

## 2014-09-27 DIAGNOSIS — Z794 Long term (current) use of insulin: Secondary | ICD-10-CM | POA: Insufficient documentation

## 2014-09-27 DIAGNOSIS — Z3202 Encounter for pregnancy test, result negative: Secondary | ICD-10-CM | POA: Insufficient documentation

## 2014-09-27 DIAGNOSIS — I10 Essential (primary) hypertension: Secondary | ICD-10-CM | POA: Insufficient documentation

## 2014-09-27 DIAGNOSIS — Z79899 Other long term (current) drug therapy: Secondary | ICD-10-CM | POA: Insufficient documentation

## 2014-09-27 DIAGNOSIS — Z8619 Personal history of other infectious and parasitic diseases: Secondary | ICD-10-CM | POA: Insufficient documentation

## 2014-09-27 DIAGNOSIS — H269 Unspecified cataract: Secondary | ICD-10-CM | POA: Insufficient documentation

## 2014-09-27 DIAGNOSIS — G8929 Other chronic pain: Secondary | ICD-10-CM | POA: Insufficient documentation

## 2014-09-27 DIAGNOSIS — G629 Polyneuropathy, unspecified: Secondary | ICD-10-CM | POA: Insufficient documentation

## 2014-09-27 DIAGNOSIS — E1065 Type 1 diabetes mellitus with hyperglycemia: Secondary | ICD-10-CM | POA: Insufficient documentation

## 2014-09-27 DIAGNOSIS — E1040 Type 1 diabetes mellitus with diabetic neuropathy, unspecified: Secondary | ICD-10-CM | POA: Insufficient documentation

## 2014-09-27 DIAGNOSIS — Z9089 Acquired absence of other organs: Secondary | ICD-10-CM | POA: Insufficient documentation

## 2014-09-27 DIAGNOSIS — Z862 Personal history of diseases of the blood and blood-forming organs and certain disorders involving the immune mechanism: Secondary | ICD-10-CM | POA: Insufficient documentation

## 2014-09-27 DIAGNOSIS — Z87442 Personal history of urinary calculi: Secondary | ICD-10-CM | POA: Insufficient documentation

## 2014-09-27 LAB — CBC WITH DIFFERENTIAL/PLATELET
Basophils Absolute: 0 10*3/uL (ref 0.0–0.1)
Basophils Relative: 0 % (ref 0–1)
EOS ABS: 0 10*3/uL (ref 0.0–0.7)
EOS PCT: 0 % (ref 0–5)
HCT: 40.5 % (ref 36.0–46.0)
Hemoglobin: 12.8 g/dL (ref 12.0–15.0)
LYMPHS ABS: 2.8 10*3/uL (ref 0.7–4.0)
Lymphocytes Relative: 24 % (ref 12–46)
MCH: 23 pg — AB (ref 26.0–34.0)
MCHC: 31.6 g/dL (ref 30.0–36.0)
MCV: 72.8 fL — AB (ref 78.0–100.0)
Monocytes Absolute: 0.7 10*3/uL (ref 0.1–1.0)
Monocytes Relative: 6 % (ref 3–12)
Neutro Abs: 8.1 10*3/uL — ABNORMAL HIGH (ref 1.7–7.7)
Neutrophils Relative %: 70 % (ref 43–77)
Platelets: 187 10*3/uL (ref 150–400)
RBC: 5.56 MIL/uL — ABNORMAL HIGH (ref 3.87–5.11)
RDW: 15.2 % (ref 11.5–15.5)
WBC: 11.6 10*3/uL — ABNORMAL HIGH (ref 4.0–10.5)

## 2014-09-27 LAB — PREGNANCY, URINE: PREG TEST UR: NEGATIVE

## 2014-09-27 LAB — CBG MONITORING, ED
GLUCOSE-CAPILLARY: 240 mg/dL — AB (ref 70–99)
Glucose-Capillary: 328 mg/dL — ABNORMAL HIGH (ref 70–99)

## 2014-09-27 LAB — COMPREHENSIVE METABOLIC PANEL
ALT: 49 U/L — ABNORMAL HIGH (ref 0–35)
AST: 45 U/L — ABNORMAL HIGH (ref 0–37)
Albumin: 4.2 g/dL (ref 3.5–5.2)
Alkaline Phosphatase: 80 U/L (ref 39–117)
Anion gap: 15 (ref 5–15)
BUN: 26 mg/dL — ABNORMAL HIGH (ref 6–23)
CALCIUM: 9.3 mg/dL (ref 8.4–10.5)
CO2: 19 mmol/L (ref 19–32)
Chloride: 105 mmol/L (ref 96–112)
Creatinine, Ser: 0.63 mg/dL (ref 0.50–1.10)
GLUCOSE: 330 mg/dL — AB (ref 70–99)
Potassium: 4.3 mmol/L (ref 3.5–5.1)
Sodium: 139 mmol/L (ref 135–145)
Total Bilirubin: 1.1 mg/dL (ref 0.3–1.2)
Total Protein: 7.8 g/dL (ref 6.0–8.3)

## 2014-09-27 LAB — URINALYSIS, ROUTINE W REFLEX MICROSCOPIC
Bilirubin Urine: NEGATIVE
Glucose, UA: >1000 mg/dL — AB
Hgb urine dipstick: NEGATIVE
Ketones, ur: >80 mg/dL — AB
Leukocytes, UA: NEGATIVE
Nitrite: NEGATIVE
PH: 5.5 (ref 5.0–8.0)
Protein, ur: NEGATIVE mg/dL
Specific Gravity, Urine: 1.042 — ABNORMAL HIGH (ref 1.005–1.030)
Urobilinogen, UA: 0.2 mg/dL (ref 0.0–1.0)

## 2014-09-27 LAB — I-STAT CG4 LACTIC ACID, ED
Lactic Acid, Venous: 1.79 mmol/L (ref 0.5–2.0)
Lactic Acid, Venous: 1.37 mmol/L (ref 0.5–2.0)

## 2014-09-27 LAB — URINE MICROSCOPIC-ADD ON

## 2014-09-27 LAB — LIPASE, BLOOD: Lipase: 17 U/L (ref 11–59)

## 2014-09-27 LAB — I-STAT TROPONIN, ED: Troponin i, poc: 0 ng/mL (ref 0.00–0.08)

## 2014-09-27 MED ORDER — MORPHINE SULFATE 4 MG/ML IJ SOLN
4.0000 mg | Freq: Once | INTRAMUSCULAR | Status: AC
  Administered 2014-09-27: 4 mg via INTRAVENOUS
  Filled 2014-09-27: qty 1

## 2014-09-27 MED ORDER — PROMETHAZINE HCL 25 MG PO TABS: 25.0000 mg | ORAL_TABLET | Freq: Four times a day (QID) | ORAL | Status: DC | PRN

## 2014-09-27 MED ORDER — SODIUM CHLORIDE 0.9 % IV BOLUS (SEPSIS)
1000.0000 mL | Freq: Once | INTRAVENOUS | Status: AC
Start: 2014-09-27 — End: 2014-09-27
  Administered 2014-09-27: 1000 mL via INTRAVENOUS

## 2014-09-27 MED ORDER — ONDANSETRON 8 MG PO TBDP
8.0000 mg | ORAL_TABLET | Freq: Once | ORAL | Status: AC
  Administered 2014-09-27: 8 mg via ORAL
  Filled 2014-09-27: qty 1

## 2014-09-27 MED ORDER — ONDANSETRON HCL 4 MG/2ML IJ SOLN
4.0000 mg | Freq: Once | INTRAMUSCULAR | Status: AC
  Administered 2014-09-27: 4 mg via INTRAVENOUS
  Filled 2014-09-27: qty 2

## 2014-09-27 MED ORDER — PROMETHAZINE HCL 25 MG/ML IJ SOLN
25.0000 mg | Freq: Once | INTRAMUSCULAR | Status: AC
  Administered 2014-09-27: 25 mg via INTRAVENOUS
  Filled 2014-09-27: qty 1

## 2014-09-27 MED ORDER — INSULIN ASPART 100 UNIT/ML ~~LOC~~ SOLN
5.0000 [IU] | Freq: Once | SUBCUTANEOUS | Status: AC
  Administered 2014-09-27: 5 [IU] via SUBCUTANEOUS
  Filled 2014-09-27: qty 1

## 2014-09-27 MED ORDER — SODIUM CHLORIDE 0.9 % IV BOLUS (SEPSIS)
1000.0000 mL | Freq: Once | INTRAVENOUS | Status: AC
  Administered 2014-09-27: 1000 mL via INTRAVENOUS

## 2014-09-27 NOTE — ED Notes (Signed)
PO trial initiated.

## 2014-09-27 NOTE — ED Notes (Addendum)
Per EMS, pt states she's been nauseous, vomiting for the last two days. Has not been able to take any of her medications or insulin over the last two days. Denies diarrhea. Also complains of abdominal pain above the [employer].

## 2014-09-27 NOTE — Discharge Instructions (Signed)
1. Medications: phenergan, usual home medications 2. Treatment: rest, drink plenty of fluids, advance diet slowly 3. Follow Up: Please followup with your primary doctor in 2 days for discussion of your diagnoses and further evaluation after today's visit; if you do not have a primary care doctor use the resource guide provided to find one; Please return to the ER for persistent vomiting, high fevers or worsening symptoms  

## 2014-09-27 NOTE — ED Notes (Addendum)
I attempted to collect patient blood and was unsuccessful.  I made nurse aware. 

## 2014-09-27 NOTE — ED Provider Notes (Signed)
CSN: [id number]     Arrival date & time 09/27/14  44 History   First MD Initiated Contact with Patient 09/27/14 1657     Chief Complaint  Patient presents with  . Emesis  . Nausea     (Consider location/radiation/quality/duration/timing/severity/associated sxs/prior Treatment) Patient is a 44 y.o. female presenting with vomiting. The history is provided by the patient and medical records. No language interpreter was used.  Emesis Associated symptoms: abdominal pain   Associated symptoms: no diarrhea      Mary [name] is a 44 y.o. female  with a hx of gastritis, IDDM, diabetic gastroparesis, chronic abdominal pain presents to the Emergency Department complaining of gradual, persistent, progressively worsening generalized abd pain onset 2 days ago.  Pt reports pain is associated with nausea and vomiting.  Pt describes pain as sharp, dull and achy rated at a 10/10.  Pt reports she is unable to keep solid or liquid food down.   Patient's last admission was in March of 2016 for DKA and gastroparesis. Patient states her pain is identical to episodes of gastroparesis she has experienced in the past, consistent with her chronic abdominal pain. Patient denies any weakness, headache, blurred vision, neck pain, chest pain, shortness of breath, vaginal discharge, vaginal bleeding, dysuria.  RN notes that per EMS pt has not taken any of her medications, however she reports she has been taking them as prescribed including her insulin but her blood sugar is always high.     Past Medical History  Diagnosis Date  . Kidney stone   . Seizure disorder     started with pregnancy of first son  . Gastritis   . Bipolar 2 disorder   . Post traumatic stress disorder     "flipping out" after people close to her died  . Heart murmur   . Arthritis     r ankle-S/P surgery (2007)  . Anemia     childhood  . Depression     childhood  . Hepatitis C     biopsy in 2010-no rx-was supposed to see a hepatologist  in [place].  With cirrhosis  . Neuropathy     in legs and feet and hands  . Cataracts, bilateral   . Scoliosis   . Diabetes mellitus     diagnosed in 1996-always been on insulin  . DKA (diabetic ketoacidoses)     Recurrent admissions for DKA, medication non-complaince, poor social situation  . Diabetic neuropathy, type I diabetes mellitus     numbness bilaterally feet  . HTN (hypertension) 10/12/2013   Past Surgical History  Procedure Laterality Date  . Ankle surgery  2007  . Endometrial biopsy  06/22/2012  . Cholecystectomy  04/07/2013  . Cholecystectomy N/A 04/07/2013    Procedure: LAPAROSCOPIC CHOLECYSTECTOMY WITH INTRAOPERATIVE CHOLANGIOGRAM;  Surgeon: Adin Hector, MD;  Location: [facility];  Service: General;  Laterality: N/A;  . Cesarean section      3 times   Family History  Problem Relation Age of Onset  . Diabetes Mother     currently 41  . Fibromyalgia Mother   . Cirrhosis Father     died in 9  . Diabetes Maternal Grandmother   . Diabetes Maternal Aunt    History  Substance Use Topics  . Smoking status: Current Every Day Smoker -- 0.10 packs/day for 25 years    Types: Cigarettes  . Smokeless tobacco: Never Used  . Alcohol Use: No     Comment: Endorses hasn't been drinking  OB History    Gravida Para Term Preterm AB TAB SAB Ectopic Multiple Living   _0 0 1 0 1 0 1 2     Review of Systems  Constitutional: Negative for fever, diaphoresis, appetite change, fatigue and unexpected weight change.  HENT: Negative for mouth sores and trouble swallowing.   Respiratory: Negative for cough, chest tightness, shortness of breath, wheezing and stridor.   Cardiovascular: Negative for chest pain and palpitations.  Gastrointestinal: Positive for nausea, vomiting and abdominal pain. Negative for diarrhea, constipation, blood in stool, abdominal distention and rectal pain.  Genitourinary: Negative for dysuria, urgency, frequency, hematuria, flank pain and  difficulty urinating.  Musculoskeletal: Negative for back pain, neck pain and neck stiffness.  Skin: Negative for rash.  Neurological: Negative for weakness.  Hematological: Negative for adenopathy.  Psychiatric/Behavioral: Negative for confusion.  All other systems reviewed and are negative.     Allergies  Penicillins  Home Medications   Prior to Admission medications   Medication Sig Start Date End Date Taking? Authorizing Provider  aspirin EC 81 MG tablet Take 81 mg by mouth daily.   Yes Historical Provider, MD  divalproex (DEPAKOTE) 500 MG DR tablet Take 1 tablet (500 mg total) by mouth 2 times daily at 12 noon and 4 pm. 08/17/14  Yes Orson Eva, MD  gabapentin (NEURONTIN) 300 MG capsule Take 300 mg by mouth 3 (three) times daily.   Yes Historical Provider, MD  Insulin Glargine (LANTUS SOLOSTAR) 100 UNIT/ML Solostar Pen Inject 35 Units into the skin daily. 08/17/14  Yes Orson Eva, MD  insulin NPH-regular Human (NOVOLIN 70/30) (70-30) 100 UNIT/ML injection Inject 8 Units into the skin 2 (two) times daily with a meal. 07/31/14  Yes Costin Karlyne Greenspan, MD  medroxyPROGESTERone (DEPO-PROVERA) 150 MG/ML injection Inject 150 mg into the muscle every 3 (three) months.   Yes Historical Provider, MD  metoCLOPramide (REGLAN) 10 MG tablet Take 1 tablet (10 mg total) by mouth every 6 (six) hours. 09/03/14  Yes Dahlia Bailiff, PA-C  risperiDONE (RISPERDAL) 2 MG tablet Take 2 mg by mouth at bedtime.   Yes Historical Provider, MD  blood glucose meter kit and supplies KIT Dispense based on patient and insurance preference. Use up to four times daily as directed. (FOR ICD-9 250.00, 250.01). Patient not taking: Reported on 09/27/2014 08/17/14   Orson Eva, MD  glucose blood test strip Check blood sugar four times daily for E10.65 Patient not taking: Reported on 09/27/2014 08/10/14   Lance Bosch, NP  Insulin Pen Needle (1ST CHOICE PEN NEEDLES) 31G X 8 MM MISC Take insulin nightly for E10.65 Patient not taking:  Reported on 09/27/2014 08/10/14   Lance Bosch, NP  Insulin Syringe-Needle U-100 (INSULIN SYRINGE 1CC/28G) 28G X 1/2" 1 ML MISC Use NPH twice daily for E10.65 Patient not taking: Reported on 09/27/2014 08/10/14   Lance Bosch, NP  Lancets (FREESTYLE) lancets Check sugar four times per day for E10.65 Patient not taking: Reported on 09/27/2014 08/10/14   Lance Bosch, NP  metoCLOPramide (REGLAN) 10 MG tablet Take 1 tablet (10 mg total) by mouth 4 (four) times daily -  before meals and at bedtime. Patient not taking: Reported on 09/27/2014 07/31/14   Caren Griffins, MD  ondansetron (ZOFRAN) 4 MG tablet Take 1 tablet (4 mg total) by mouth every 6 (six) hours. Patient not taking: Reported on 09/03/2014 06/08/14   Theodis Blaze, MD  oxyCODONE-acetaminophen (PERCOCET/ROXICET) 5-325 MG per tablet Take 1-2 tablets by  mouth every 3 (three) hours as needed for moderate pain. Patient not taking: Reported on 09/03/2014 07/31/14   Caren Griffins, MD  polyethylene glycol (MIRALAX / Floria Raveling) packet Take 17 g by mouth daily as needed. Patient not taking: Reported on 09/03/2014 07/31/14   Caren Griffins, MD  promethazine (PHENERGAN) 25 MG tablet Take 1 tablet (25 mg total) by mouth every 6 (six) hours as needed for nausea or vomiting. 09/27/14   Jarrett Soho Kutler Vanvranken, PA-C   BP 148/94 mmHg  Pulse 77  Temp(Src) 98.7 F (37.1 C) (Oral)  Resp 16  Ht _0  (1.626 m)  Wt 120 lb (54.432 kg)  BMI 20.59 kg/m2  SpO2 100% Physical Exam  Constitutional: She appears well-developed and well-nourished.  HENT:  Head: Normocephalic and atraumatic.  Mouth/Throat: Oropharynx is clear and moist.  Eyes: Conjunctivae are normal. No scleral icterus.  Cardiovascular: Normal rate, regular rhythm, normal heart sounds and intact distal pulses.   No murmur heard. Pulmonary/Chest: Effort normal and breath sounds normal.  Abdominal: Soft. Bowel sounds are normal. She exhibits no distension and no mass. There is generalized  tenderness. There is guarding. There is no rebound and no CVA tenderness.  Neurological: She is alert.  Skin: Skin is warm and dry.  Psychiatric: She has a normal mood and affect.  Nursing note and vitals reviewed.   ED Course  Procedures (including critical care time) Labs Review Labs Reviewed  CBC WITH DIFFERENTIAL/PLATELET - Abnormal; Notable for the following:    WBC 11.6 (*)    RBC 5.56 (*)    MCV 72.8 (*)    MCH 23.0 (*)    Neutro Abs 8.1 (*)    All other components within normal limits  COMPREHENSIVE METABOLIC PANEL - Abnormal; Notable for the following:    Glucose, Bld 330 (*)    BUN 26 (*)    AST 45 (*)    ALT 49 (*)    All other components within normal limits  URINALYSIS, ROUTINE W REFLEX MICROSCOPIC - Abnormal; Notable for the following:    Specific Gravity, Urine 1.042 (*)    Glucose, UA >1000 (*)    Ketones, ur >80 (*)    All other components within normal limits  URINE MICROSCOPIC-ADD ON - Abnormal; Notable for the following:    Bacteria, UA FEW (*)    All other components within normal limits  CBG MONITORING, ED - Abnormal; Notable for the following:    Glucose-Capillary 328 (*)    All other components within normal limits  CBG MONITORING, ED - Abnormal; Notable for the following:    Glucose-Capillary 240 (*)    All other components within normal limits  LIPASE, BLOOD  PREGNANCY, URINE  I-STAT TROPOININ, ED  I-STAT CG4 LACTIC ACID, ED  I-STAT CG4 LACTIC ACID, ED    Imaging Review No results found.   EKG Interpretation None      MDM   Final diagnoses:  Hyperglycemia  Gastroparesis  Chronic abdominal pain    Mary [name] presents with abd pain similar to previous episodes of gastroparesis.  Will check labs, give fluid and anti-nausea medications.  No emesis in the department.  7:01 PM Pt continues to c/o nausea and pain.  No elevated AG. UA pending.  Will continue fluids, give pain control and reassess.    8:29 PM Pt reports she is  feeling better and has been able to tolerate juice and crackers without emesis or return of pain.  UA pending.  Repeat CBG  240.    9:33 PM UA with large keytones though this was collected prior to her 2L of fluid.  AG was 15.  She reports feeling much better and continues to tolerate PO.  She reports she can f/u with her PCP this week.  Pt's CBG improved and she reports CBG in the 200's is normal for her.  ON repeat exam abd is soft and nontender.    Patient is nontoxic, nonseptic appearing, in no apparent distress.  Patient's pain and other symptoms adequately managed in emergency department.  Fluid bolus given.  Labs, imaging and vitals reviewed.  Patient does not meet the SIRS or Sepsis criteria.  On repeat exam patient does not have a surgical abdomin and there are no peritoneal signs.  No indication of appendicitis, bowel obstruction, bowel perforation, cholecystitis, diverticulitis, PID or ectopic pregnancy.  Patient discharged home with symptomatic treatment and given strict instructions for follow-up with their primary care physician.  I have also discussed reasons to return immediately to the ER.  Patient expresses understanding and agrees with plan.  BP 148/94 mmHg  Pulse 77  Temp(Src) 98.7 F (37.1 C) (Oral)  Resp 16  Ht _0  (1.626 m)  Wt 120 lb (54.432 kg)  BMI 20.59 kg/m2  SpO2 100%   The patient was discussed with Dr. Alvino Chapel who agrees with the treatment plan.     Jarrett Soho Kristine Chahal, PA-C 09/27/14 2137  Davonna Belling, MD 09/28/14 [phone]

## 2014-10-03 ENCOUNTER — Emergency Department (HOSPITAL_COMMUNITY)
Admission: EM | Admit: 2014-10-03 | Discharge: 2014-10-03 | Disposition: A | Discharge status: Home / Self Care | Payer: Self-pay | Attending: Emergency Medicine | Admitting: Emergency Medicine

## 2014-10-03 ENCOUNTER — Encounter (HOSPITAL_COMMUNITY): Payer: Self-pay | Admitting: *Deleted

## 2014-10-03 DIAGNOSIS — H269 Unspecified cataract: Secondary | ICD-10-CM | POA: Insufficient documentation

## 2014-10-03 DIAGNOSIS — Z79899 Other long term (current) drug therapy: Secondary | ICD-10-CM | POA: Insufficient documentation

## 2014-10-03 DIAGNOSIS — Z880 Allergy status to penicillin: Secondary | ICD-10-CM | POA: Insufficient documentation

## 2014-10-03 DIAGNOSIS — Z794 Long term (current) use of insulin: Secondary | ICD-10-CM | POA: Insufficient documentation

## 2014-10-03 DIAGNOSIS — I10 Essential (primary) hypertension: Secondary | ICD-10-CM | POA: Insufficient documentation

## 2014-10-03 DIAGNOSIS — E1040 Type 1 diabetes mellitus with diabetic neuropathy, unspecified: Secondary | ICD-10-CM | POA: Insufficient documentation

## 2014-10-03 DIAGNOSIS — R1084 Generalized abdominal pain: Secondary | ICD-10-CM | POA: Insufficient documentation

## 2014-10-03 DIAGNOSIS — K3184 Gastroparesis: Secondary | ICD-10-CM | POA: Insufficient documentation

## 2014-10-03 DIAGNOSIS — R1115 Cyclical vomiting syndrome unrelated to migraine: Secondary | ICD-10-CM

## 2014-10-03 DIAGNOSIS — R011 Cardiac murmur, unspecified: Secondary | ICD-10-CM | POA: Insufficient documentation

## 2014-10-03 DIAGNOSIS — Z8619 Personal history of other infectious and parasitic diseases: Secondary | ICD-10-CM | POA: Insufficient documentation

## 2014-10-03 DIAGNOSIS — Z87442 Personal history of urinary calculi: Secondary | ICD-10-CM | POA: Insufficient documentation

## 2014-10-03 DIAGNOSIS — M419 Scoliosis, unspecified: Secondary | ICD-10-CM | POA: Insufficient documentation

## 2014-10-03 DIAGNOSIS — Z720 Tobacco use: Secondary | ICD-10-CM | POA: Insufficient documentation

## 2014-10-03 DIAGNOSIS — M19071 Primary osteoarthritis, right ankle and foot: Secondary | ICD-10-CM | POA: Insufficient documentation

## 2014-10-03 DIAGNOSIS — Z9889 Other specified postprocedural states: Secondary | ICD-10-CM | POA: Insufficient documentation

## 2014-10-03 DIAGNOSIS — R61 Generalized hyperhidrosis: Secondary | ICD-10-CM | POA: Insufficient documentation

## 2014-10-03 DIAGNOSIS — Z9089 Acquired absence of other organs: Secondary | ICD-10-CM | POA: Insufficient documentation

## 2014-10-03 DIAGNOSIS — Z862 Personal history of diseases of the blood and blood-forming organs and certain disorders involving the immune mechanism: Secondary | ICD-10-CM | POA: Insufficient documentation

## 2014-10-03 DIAGNOSIS — F3181 Bipolar II disorder: Secondary | ICD-10-CM | POA: Insufficient documentation

## 2014-10-03 DIAGNOSIS — G43A1 Cyclical vomiting, intractable: Secondary | ICD-10-CM | POA: Insufficient documentation

## 2014-10-03 DIAGNOSIS — Z7982 Long term (current) use of aspirin: Secondary | ICD-10-CM | POA: Insufficient documentation

## 2014-10-03 LAB — COMPREHENSIVE METABOLIC PANEL
ALT: 39 U/L — ABNORMAL HIGH (ref 0–35)
AST: 35 U/L (ref 0–37)
Albumin: 4.2 g/dL (ref 3.5–5.2)
Alkaline Phosphatase: 82 U/L (ref 39–117)
Anion gap: 15 (ref 5–15)
BUN: 15 mg/dL (ref 6–23)
CALCIUM: 9.5 mg/dL (ref 8.4–10.5)
CO2: 21 mmol/L (ref 19–32)
Chloride: 104 mmol/L (ref 96–112)
Creatinine, Ser: 0.56 mg/dL (ref 0.50–1.10)
GFR calc Af Amer: >90 mL/min (ref >90)
GLUCOSE: 332 mg/dL — AB (ref 70–99)
Potassium: 3.6 mmol/L (ref 3.5–5.1)
SODIUM: 140 mmol/L (ref 135–145)
TOTAL PROTEIN: 7.6 g/dL (ref 6.0–8.3)
Total Bilirubin: 0.9 mg/dL (ref 0.3–1.2)

## 2014-10-03 LAB — CBC WITH DIFFERENTIAL/PLATELET
BASOS PCT: 0 % (ref 0–1)
Basophils Absolute: 0 10*3/uL (ref 0.0–0.1)
EOS PCT: 0 % (ref 0–5)
Eosinophils Absolute: 0 10*3/uL (ref 0.0–0.7)
HEMATOCRIT: 40.8 % (ref 36.0–46.0)
HEMOGLOBIN: 12.7 g/dL (ref 12.0–15.0)
Lymphocytes Relative: 27 % (ref 12–46)
Lymphs Abs: 3.7 10*3/uL (ref 0.7–4.0)
MCH: 22.8 pg — AB (ref 26.0–34.0)
MCHC: 31.1 g/dL (ref 30.0–36.0)
MCV: 73.2 fL — ABNORMAL LOW (ref 78.0–100.0)
MONO ABS: 0.5 10*3/uL (ref 0.1–1.0)
Monocytes Relative: 4 % (ref 3–12)
Neutro Abs: 9.4 10*3/uL — ABNORMAL HIGH (ref 1.7–7.7)
Neutrophils Relative %: 69 % (ref 43–77)
Platelets: 155 10*3/uL (ref 150–400)
RBC: 5.57 MIL/uL — ABNORMAL HIGH (ref 3.87–5.11)
RDW: 14.9 % (ref 11.5–15.5)
WBC: 13.6 10*3/uL — ABNORMAL HIGH (ref 4.0–10.5)

## 2014-10-03 LAB — I-STAT CG4 LACTIC ACID, ED: Lactic Acid, Venous: 2.13 mmol/L (ref 0.5–2.0)

## 2014-10-03 MED ORDER — SODIUM CHLORIDE 0.9 % IV BOLUS (SEPSIS)
1000.0000 mL | Freq: Once | INTRAVENOUS | Status: AC
  Administered 2014-10-03: 1000 mL via INTRAVENOUS

## 2014-10-03 MED ORDER — DICYCLOMINE HCL 20 MG PO TABS: 20.0000 mg | ORAL_TABLET | Freq: Two times a day (BID) | ORAL | Status: DC

## 2014-10-03 MED ORDER — KETOROLAC TROMETHAMINE 30 MG/ML IJ SOLN
30.0000 mg | Freq: Once | INTRAMUSCULAR | Status: AC
  Administered 2014-10-03: 30 mg via INTRAVENOUS
  Filled 2014-10-03: qty 1

## 2014-10-03 MED ORDER — DICYCLOMINE HCL 10 MG/ML IM SOLN
20.0000 mg | Freq: Once | INTRAMUSCULAR | Status: AC
  Administered 2014-10-03: 20 mg via INTRAMUSCULAR
  Filled 2014-10-03: qty 2

## 2014-10-03 MED ORDER — PROMETHAZINE HCL 25 MG RE SUPP: 25.0000 mg | Freq: Four times a day (QID) | RECTAL | Status: DC | PRN

## 2014-10-03 MED ORDER — LORAZEPAM 2 MG/ML IJ SOLN
1.0000 mg | Freq: Once | INTRAMUSCULAR | Status: AC
  Administered 2014-10-03: 1 mg via INTRAVENOUS
  Filled 2014-10-03: qty 1

## 2014-10-03 NOTE — Discharge Instructions (Signed)
As discussed, it is important that you follow up as soon as possible with your physician for continued management of your condition.  If you develop any new, or concerning changes in your condition, please return to the emergency department immediately.  Cyclic Vomiting Syndrome Cyclic vomiting syndrome is a benign condition in which patients experience bouts or cycles of severe nausea and vomiting that last for hours or even days. The bouts of nausea and vomiting alternate with longer periods of no symptoms and generally good health. Cyclic vomiting syndrome occurs mostly in children, but can affect adults. CAUSES  [facility] has no known cause. Each episode is typically similar to the previous ones. The episodes tend to:   Start at about the same time of day.  Last the same length of time.  Present the same symptoms at the same level of intensity. Cyclic vomiting syndrome can begin at any age in children and adults. Cyclic vomiting syndrome usually starts between the ages of 3 and 7 years. In adults, episodes tend to occur less often than they do in children, but they last longer. Furthermore, the events or situations that trigger episodes in adults cannot always be pinpointed as easily as they can in children. There are 4 phases of cyclic vomiting syndrome: 1. Prodrome. The prodrome phase signals that an episode of nausea and vomiting is about to begin. This phase can last from just a few minutes to several hours. This phase is often marked by belly (abdominal) pain. Sometimes taking medicine early in the prodrome phase can stop an episode in progress. However, sometimes there is no warning. A person may simply wake up in the middle of the night or early morning and begin vomiting. 2. Episode. The episode phase consists of:  Severe vomiting.  Nausea.  Gagging (retching). 3. Recovery. The recovery phase begins when the nausea and vomiting stop. Healthy color, appetite, and energy  return. 4. Symptom-free interval. The symptom-free interval phase is the period between episodes when no symptoms are present. TRIGGERS Episodes can be triggered by an infection or event. Examples of triggers include:  Infections.  Colds, allergies, sinus problems, and the flu.  Eating certain foods such as chocolate or cheese.  Foods with monosodium glutamate (MSG) or preservatives.  Fast foods.  Pre-packaged foods.  Foods with low nutritional value (junk foods).  Overeating.  Eating just before going to bed.  Hot weather.  Dehydration.  Not enough sleep or poor sleep quality.  Physical exhaustion.  Menstruation.  Motion sickness.  Emotional stress (school or home difficulties).  Excitement or stress. SYMPTOMS  The main symptoms of cyclic vomiting syndrome are:  Severe vomiting.  Nausea.  Gagging (retching). Episodes usually begin at night or the first thing in the morning. Episodes may include vomiting or retching up to 5 or 6 times an hour during the worst of the episode. Episodes usually last anywhere from 1 to 4 days. Episodes can last for up to 10 days. Other symptoms include:  Paleness.  Exhaustion.  Listlessness.  Abdominal pain.  Loose stools or diarrhea. Sometimes the nausea and vomiting are so severe that a person appears to be almost unconscious. Sensitivity to light, headache, fever, dizziness, may also accompany an episode. In addition, the vomiting may cause drooling and excessive thirst. Drinking water usually leads to more vomiting, though the water can dilute the acid in the vomit, making the episode a little less painful. Continuous vomiting can lead to dehydration, which means that the body has lost excessive  water and salts. DIAGNOSIS  Cyclic vomiting syndrome is hard to diagnose because there are no clear tests to identify it. A caregiver must diagnose cyclic vomiting syndrome by looking at symptoms and medical history. A caregiver must  exclude more common diseases or disorders that can also cause nausea and vomiting. Also, diagnosis takes time because caregivers need to identify a pattern or cycle to the vomiting. TREATMENT  Cyclic vomiting syndrome cannot be cured. Treatment varies, but people with cyclic vomiting syndrome should get plenty of rest and sleep and take medications that prevent, stop, or lessen the vomiting episodes and other symptoms. People whose episodes are frequent and long-lasting may be treated during the symptom-free intervals in an effort to prevent or ease future episodes. The symptom-free phase is a good time to eliminate anything known to trigger an episode. For example, if episodes are brought on by stress or excitement, this period is the time to find ways to reduce stress and stay calm. If sinus problems or allergies cause episodes, those conditions should be treated. The triggers listed above should be avoided or prevented. Because of the similarities between migraine and cyclic vomiting syndrome, caregivers treat some people with severe cyclic vomiting syndrome with drugs that are also used for migraine headaches. The drugs are designed to:  Prevent episodes.  Reduce their frequency.  Lessen their severity. HOME CARE INSTRUCTIONS Once a vomiting episode begins, treatment is supportive. It helps to stay in bed and sleep in a dark, quiet room. Severe nausea and vomiting may require hospitalization and intravenous (IV) fluids to prevent dehydration. Relaxing medications (sedatives) may help if the nausea continues. Sometimes, during the prodrome phase, it is possible to stop an episode from happening altogether. Only take over-the-counter or prescription medicines for pain, discomfort or fever as directed by your caregiver. Do not give aspirin to children. During the recovery phase, drinking water and replacing lost electrolytes (salts in the blood) are very important. Electrolytes are salts that the body  needs to function well and stay healthy. Symptoms during the recovery phase can vary. Some people find that their appetites return to normal immediately, while others need to begin by drinking clear liquids and then move slowly to solid food. RELATED COMPLICATIONS The severe vomiting that defines cyclic vomiting syndrome is a risk factor for several complications:  Dehydration--Vomiting causes the body to lose water quickly.  Electrolyte imbalance--Vomiting also causes the body to lose the important salts it needs to keep working properly.  Peptic esophagitis--The tube that connects the mouth to the stomach (esophagus) becomes injured from the stomach acid that comes up with the vomit.  Hematemesis--The esophagus becomes irritated and bleeds, so blood mixes with the vomit.  Mallory-Weiss tear--The lower end of the esophagus may tear open or the stomach may bruise from vomiting or retching.  Tooth decay--The acid in the vomit can hurt the teeth by corroding the tooth enamel. SEEK MEDICAL CARE IF: You have questions or problems. Document Released: 08/12/2001 Document Revised: 08/26/2011 Document Reviewed: 09/10/2010 [facility] Patient Information 2015 [place], [place]. This information is not intended to replace advice given to you by your health care provider. Make sure you discuss any questions you have with your health care provider.

## 2014-10-03 NOTE — ED Notes (Signed)
Pt refusing to be discharged, sts she needs more time and more medicine. Will notify Dr Vanita Panda.

## 2014-10-03 NOTE — ED Provider Notes (Signed)
CSN: [id number]     Arrival date & time 10/03/14  0815 History   First MD Initiated Contact with Patient 10/03/14 0830     Chief Complaint  Patient presents with  . Abdominal Pain     (Consider location/radiation/quality/duration/timing/severity/associated sxs/prior Treatment) HPI Patient presents with concern of diffuse abdominal pain, nausea, vomiting. This episode began yesterday.  Since onset has been persistent. Patient is intolerant of all oral medication, and there are no relieving factors. The patient is diffuse, crampy, sharp, severe. There is substantial nausea, with multiple episodes of vomiting, but no diarrhea. Patient states that this is characteristically the same as multiple prior episodes of gastroparesis. No new fever, syncope, chest pain, dyspnea.  Past Medical History  Diagnosis Date  . Kidney stone   . Seizure disorder     started with pregnancy of first son  . Gastritis   . Bipolar 2 disorder   . Post traumatic stress disorder     "flipping out" after people close to her died  . Heart murmur   . Arthritis     r ankle-S/P surgery (2007)  . Anemia     childhood  . Depression     childhood  . Hepatitis C     biopsy in 2010-no rx-was supposed to see a hepatologist in [place].  With cirrhosis  . Neuropathy     in legs and feet and hands  . Cataracts, bilateral   . Scoliosis   . Diabetes mellitus     diagnosed in 1996-always been on insulin  . DKA (diabetic ketoacidoses)     Recurrent admissions for DKA, medication non-complaince, poor social situation  . Diabetic neuropathy, type I diabetes mellitus     numbness bilaterally feet  . HTN (hypertension) 10/12/2013   Past Surgical History  Procedure Laterality Date  . Ankle surgery  2007  . Endometrial biopsy  06/22/2012  . Cholecystectomy  04/07/2013  . Cholecystectomy N/A 04/07/2013    Procedure: LAPAROSCOPIC CHOLECYSTECTOMY WITH INTRAOPERATIVE CHOLANGIOGRAM;  Surgeon: Adin Hector, MD;   Location: [facility];  Service: General;  Laterality: N/A;  . Cesarean section      3 times   Family History  Problem Relation Age of Onset  . Diabetes Mother     currently 43  . Fibromyalgia Mother   . Cirrhosis Father     died in 34  . Diabetes Maternal Grandmother   . Diabetes Maternal Aunt    History  Substance Use Topics  . Smoking status: Current Every Day Smoker -- 0.10 packs/day for 25 years    Types: Cigarettes  . Smokeless tobacco: Never Used  . Alcohol Use: No     Comment: Endorses hasn't been drinking   OB History    Gravida Para Term Preterm AB TAB SAB Ectopic Multiple Living   _0 0 1 0 1 0 1 2     Review of Systems  Constitutional:       Per HPI, otherwise negative  HENT:       Per HPI, otherwise negative  Respiratory:       Per HPI, otherwise negative  Cardiovascular:       Per HPI, otherwise negative  Gastrointestinal: Negative for vomiting.  Endocrine:       Negative aside from HPI  Genitourinary:       Neg aside from HPI   Musculoskeletal:       Per HPI, otherwise negative  Skin: Negative.   Neurological: Negative for syncope.  Allergies  Penicillins  Home Medications   Prior to Admission medications   Medication Sig Start Date End Date Taking? Authorizing Provider  aspirin EC 81 MG tablet Take 81 mg by mouth daily.    Historical Provider, MD  blood glucose meter kit and supplies KIT Dispense based on patient and insurance preference. Use up to four times daily as directed. (FOR ICD-9 250.00, 250.01). Patient not taking: Reported on 09/27/2014 08/17/14   Orson Eva, MD  divalproex (DEPAKOTE) 500 MG DR tablet Take 1 tablet (500 mg total) by mouth 2 times daily at 12 noon and 4 pm. 08/17/14   Orson Eva, MD  gabapentin (NEURONTIN) 300 MG capsule Take 300 mg by mouth 3 (three) times daily.    Historical Provider, MD  glucose blood test strip Check blood sugar four times daily for E10.65 Patient not taking: Reported on 09/27/2014 08/10/14    Lance Bosch, NP  Insulin Glargine (LANTUS SOLOSTAR) 100 UNIT/ML Solostar Pen Inject 35 Units into the skin daily. 08/17/14   Orson Eva, MD  insulin NPH-regular Human (NOVOLIN 70/30) (70-30) 100 UNIT/ML injection Inject 8 Units into the skin 2 (two) times daily with a meal. 07/31/14   Costin Karlyne Greenspan, MD  Insulin Pen Needle (1ST CHOICE PEN NEEDLES) 31G X 8 MM MISC Take insulin nightly for E10.65 Patient not taking: Reported on 09/27/2014 08/10/14   Lance Bosch, NP  Insulin Syringe-Needle U-100 (INSULIN SYRINGE 1CC/28G) 28G X 1/2" 1 ML MISC Use NPH twice daily for E10.65 Patient not taking: Reported on 09/27/2014 08/10/14   Lance Bosch, NP  Lancets (FREESTYLE) lancets Check sugar four times per day for E10.65 Patient not taking: Reported on 09/27/2014 08/10/14   Lance Bosch, NP  medroxyPROGESTERone (DEPO-PROVERA) 150 MG/ML injection Inject 150 mg into the muscle every 3 (three) months.    Historical Provider, MD  metoCLOPramide (REGLAN) 10 MG tablet Take 1 tablet (10 mg total) by mouth 4 (four) times daily -  before meals and at bedtime. Patient not taking: Reported on 09/27/2014 07/31/14   Caren Griffins, MD  metoCLOPramide (REGLAN) 10 MG tablet Take 1 tablet (10 mg total) by mouth every 6 (six) hours. 09/03/14   Dahlia Bailiff, PA-C  ondansetron (ZOFRAN) 4 MG tablet Take 1 tablet (4 mg total) by mouth every 6 (six) hours. Patient not taking: Reported on 09/03/2014 06/08/14   Theodis Blaze, MD  oxyCODONE-acetaminophen (PERCOCET/ROXICET) 5-325 MG per tablet Take 1-2 tablets by mouth every 3 (three) hours as needed for moderate pain. Patient not taking: Reported on 09/03/2014 07/31/14   Caren Griffins, MD  polyethylene glycol (MIRALAX / Floria Raveling) packet Take 17 g by mouth daily as needed. Patient not taking: Reported on 09/03/2014 07/31/14   Caren Griffins, MD  promethazine (PHENERGAN) 25 MG tablet Take 1 tablet (25 mg total) by mouth every 6 (six) hours as needed for nausea or vomiting. 09/27/14    Jarrett Soho Muthersbaugh, PA-C  risperiDONE (RISPERDAL) 2 MG tablet Take 2 mg by mouth at bedtime.    Historical Provider, MD   BP 159/86 mmHg  Pulse 86  Temp(Src) 98 F (36.7 C) (Oral)  Resp 22  SpO2 97% Physical Exam  Constitutional: She is oriented to person, place, and time. She appears well-developed and well-nourished. She appears distressed.  Very uncomfortable appearing female in decubitus position  HENT:  Head: Normocephalic and atraumatic.  Eyes: Conjunctivae and EOM are normal.  Cardiovascular: Normal rate and regular rhythm.   Pulmonary/Chest: Effort normal  and breath sounds normal. No stridor. No respiratory distress.  Abdominal: She exhibits no distension. There is no tenderness. There is no guarding.  Musculoskeletal: She exhibits no edema.  Neurological: She is alert and oriented to person, place, and time. No cranial nerve deficit.  Skin: Skin is warm. She is diaphoretic.  Psychiatric: She has a normal mood and affect.  Nursing note and vitals reviewed.   ED Course  Procedures (including critical care time) Labs Review Labs Reviewed  COMPREHENSIVE METABOLIC PANEL - Abnormal; Notable for the following:    Glucose, Bld 332 (*)    ALT 39 (*)    All other components within normal limits  CBC WITH DIFFERENTIAL/PLATELET - Abnormal; Notable for the following:    WBC 13.6 (*)    RBC 5.57 (*)    MCV 73.2 (*)    MCH 22.8 (*)    Neutro Abs 9.4 (*)    All other components within normal limits  I-STAT CG4 LACTIC ACID, ED - Abnormal; Notable for the following:    Lactic Acid, Venous 2.13 (*)    All other components within normal limits     Chart review demonstrates multiple prior visits for similar pain, including one 4 days ago.  9:40 AM Patient asleep Labs notable for AG 15, mild LA.  IVF running.   Update: Patient substantially improved following 2 L fluid resuscitation.   MDM   Female with history of gastroparesis presents for second time in 5 days with  recurrence of her abdominal pain, nausea, vomiting. Patient does have initial anion gap, lactic acidosis, but is afebrile, and after fluid resuscitation, improves substantially. Patient has mild leukocytosis, but low suspicion for peritonitis or other acute new pathology given the resolution of symptoms. Patient discharged in stable condition.    Carmin Muskrat, MD 10/03/14 1054

## 2014-10-03 NOTE — ED Notes (Signed)
Pt is asleep at this time, respirations even and unlabored.

## 2014-10-03 NOTE — Progress Notes (Addendum)
44 yr old female self pay [place] pt ("never had medicaid but had court date for SSI" Pending) Pt with 8 [facility] ED visits and 4 admissions in the last 6 months.  Pt c/o nausea, abdominal pain EPIC notes indicates pt refused d/c stating she needed a little more time and medications.  Pt informed cm she generally come to [facility] to get this type of care. Pt requesting "morphine and nausea medication to get me through"  States she can not afford Rx Pt referred to [facility] to get the Rx assistance. CM discussed her 8 ED visits and encouraged pcp visits.  Pt states last seen by pcp "about two weeks ago" "She was trying to get me in to see a gastroparesis doctor"  But "i have to complete the financial appointment for this but every time I was suppose to go I got sick."  CM encouraged pt to attend the financial appt to get assist with GI provider to help her manage GI issues to prevent crisis events.  Discussed her diet which pt states " I have not been following it" "It is expensive.  I get stamps but can't afford it"   [id number] CM called [place] RN, Janett Billow at [phone] Another female states Janett Billow does not have this number at this time but is at [facility] main office 1147 spoke with Charleston Ropes about having someone to assist pt Charleston Ropes reports Janett Billow is not present today Spoke with Nira Conn at [phone] (RN covering for [place]) who transferred CM back to [place] to set pt up for a financial appt Oct 27 2014 at 1030 am At same time CM noted in EPIC pt has a 10/10/14 [facility] RN cbg appt for 1000  CM entered this new financial appointment in ED f/u discharge information and printed out and gave to pt [place], Vanita Panda and RN, Ajsa updated pt given her new appts for [facility] RN and financial

## 2014-10-03 NOTE — ED Notes (Signed)
Dr Vanita Panda notified of abnormal lactic.

## 2014-10-03 NOTE — ED Notes (Signed)
Case manager at bedside 

## 2014-10-03 NOTE — ED Notes (Signed)
Bed: SQ58 Expected date:  Expected time:  Means of arrival:  Comments: EMS- 44yo F, ab pain, n/v/d

## 2014-10-03 NOTE — ED Notes (Signed)
Per EMS pt coming from home with c/o abdominal pain, n/v. Pt has hx of gastroparesis

## 2014-10-09 ENCOUNTER — Encounter (HOSPITAL_COMMUNITY): Payer: Self-pay | Admitting: Emergency Medicine

## 2014-10-09 ENCOUNTER — Inpatient Hospital Stay (HOSPITAL_COMMUNITY)
Admission: EM | Admit: 2014-10-09 | Discharge: 2014-10-12 | DRG: 639 | Disposition: A | Discharge status: Home / Self Care | Payer: Medicaid Other | Attending: Internal Medicine | Admitting: Internal Medicine

## 2014-10-09 DIAGNOSIS — F17210 Nicotine dependence, cigarettes, uncomplicated: Secondary | ICD-10-CM | POA: Diagnosis present

## 2014-10-09 DIAGNOSIS — Z880 Allergy status to penicillin: Secondary | ICD-10-CM

## 2014-10-09 DIAGNOSIS — E860 Dehydration: Secondary | ICD-10-CM | POA: Diagnosis present

## 2014-10-09 DIAGNOSIS — Z7982 Long term (current) use of aspirin: Secondary | ICD-10-CM

## 2014-10-09 DIAGNOSIS — M419 Scoliosis, unspecified: Secondary | ICD-10-CM | POA: Diagnosis present

## 2014-10-09 DIAGNOSIS — I10 Essential (primary) hypertension: Secondary | ICD-10-CM | POA: Diagnosis present

## 2014-10-09 DIAGNOSIS — F419 Anxiety disorder, unspecified: Secondary | ICD-10-CM | POA: Diagnosis present

## 2014-10-09 DIAGNOSIS — F319 Bipolar disorder, unspecified: Secondary | ICD-10-CM | POA: Diagnosis present

## 2014-10-09 DIAGNOSIS — Z87442 Personal history of urinary calculi: Secondary | ICD-10-CM | POA: Diagnosis not present

## 2014-10-09 DIAGNOSIS — E1040 Type 1 diabetes mellitus with diabetic neuropathy, unspecified: Secondary | ICD-10-CM | POA: Diagnosis present

## 2014-10-09 DIAGNOSIS — M1990 Unspecified osteoarthritis, unspecified site: Secondary | ICD-10-CM | POA: Diagnosis present

## 2014-10-09 DIAGNOSIS — K5909 Other constipation: Secondary | ICD-10-CM | POA: Diagnosis present

## 2014-10-09 DIAGNOSIS — R739 Hyperglycemia, unspecified: Secondary | ICD-10-CM

## 2014-10-09 DIAGNOSIS — R112 Nausea with vomiting, unspecified: Secondary | ICD-10-CM

## 2014-10-09 DIAGNOSIS — F4310 Post-traumatic stress disorder, unspecified: Secondary | ICD-10-CM | POA: Diagnosis present

## 2014-10-09 DIAGNOSIS — G40909 Epilepsy, unspecified, not intractable, without status epilepticus: Secondary | ICD-10-CM | POA: Diagnosis present

## 2014-10-09 DIAGNOSIS — Z9114 Patient's other noncompliance with medication regimen: Secondary | ICD-10-CM | POA: Diagnosis present

## 2014-10-09 DIAGNOSIS — Z720 Tobacco use: Secondary | ICD-10-CM | POA: Diagnosis present

## 2014-10-09 DIAGNOSIS — E0810 Diabetes mellitus due to underlying condition with ketoacidosis without coma: Secondary | ICD-10-CM

## 2014-10-09 DIAGNOSIS — E1143 Type 2 diabetes mellitus with diabetic autonomic (poly)neuropathy: Secondary | ICD-10-CM | POA: Diagnosis not present

## 2014-10-09 DIAGNOSIS — E1110 Type 2 diabetes mellitus with ketoacidosis without coma: Secondary | ICD-10-CM | POA: Diagnosis present

## 2014-10-09 DIAGNOSIS — E1065 Type 1 diabetes mellitus with hyperglycemia: Secondary | ICD-10-CM | POA: Diagnosis present

## 2014-10-09 DIAGNOSIS — R1110 Vomiting, unspecified: Secondary | ICD-10-CM

## 2014-10-09 DIAGNOSIS — B1920 Unspecified viral hepatitis C without hepatic coma: Secondary | ICD-10-CM | POA: Diagnosis present

## 2014-10-09 DIAGNOSIS — E1010 Type 1 diabetes mellitus with ketoacidosis without coma: Principal | ICD-10-CM | POA: Diagnosis present

## 2014-10-09 DIAGNOSIS — H269 Unspecified cataract: Secondary | ICD-10-CM | POA: Diagnosis present

## 2014-10-09 DIAGNOSIS — K3184 Gastroparesis: Secondary | ICD-10-CM | POA: Diagnosis present

## 2014-10-09 DIAGNOSIS — IMO0002 Reserved for concepts with insufficient information to code with codable children: Secondary | ICD-10-CM | POA: Diagnosis present

## 2014-10-09 DIAGNOSIS — R197 Diarrhea, unspecified: Secondary | ICD-10-CM

## 2014-10-09 LAB — COMPREHENSIVE METABOLIC PANEL
ALBUMIN: 3.9 g/dL (ref 3.5–5.2)
ALT: 38 U/L — AB (ref 0–35)
ANION GAP: 17 — AB (ref 5–15)
AST: 35 U/L (ref 0–37)
Alkaline Phosphatase: 84 U/L (ref 39–117)
BILIRUBIN TOTAL: 1 mg/dL (ref 0.3–1.2)
BUN: 15 mg/dL (ref 6–23)
CO2: 16 mmol/L — AB (ref 19–32)
Calcium: 9.3 mg/dL (ref 8.4–10.5)
Chloride: 115 mmol/L — ABNORMAL HIGH (ref 96–112)
Creatinine, Ser: 0.68 mg/dL (ref 0.50–1.10)
Glucose, Bld: 399 mg/dL — ABNORMAL HIGH (ref 70–99)
Potassium: 4.7 mmol/L (ref 3.5–5.1)
SODIUM: 148 mmol/L — AB (ref 135–145)
TOTAL PROTEIN: 7.1 g/dL (ref 6.0–8.3)

## 2014-10-09 LAB — BASIC METABOLIC PANEL
Anion gap: 14 (ref 5–15)
Anion gap: 8 (ref 5–15)
BUN: 15 mg/dL (ref 6–23)
BUN: 15 mg/dL (ref 6–23)
CALCIUM: 9.5 mg/dL (ref 8.4–10.5)
CALCIUM: 9.5 mg/dL (ref 8.4–10.5)
CHLORIDE: 118 mmol/L — AB (ref 96–112)
CO2: 17 mmol/L — ABNORMAL LOW (ref 19–32)
CO2: 22 mmol/L (ref 19–32)
CREATININE: 0.67 mg/dL (ref 0.50–1.10)
Chloride: 118 mmol/L — ABNORMAL HIGH (ref 96–112)
Creatinine, Ser: 0.71 mg/dL (ref 0.50–1.10)
GFR calc Af Amer: >90 mL/min (ref >90)
Glucose, Bld: 299 mg/dL — ABNORMAL HIGH (ref 70–99)
Glucose, Bld: 222 mg/dL — ABNORMAL HIGH (ref 70–99)
POTASSIUM: 4.8 mmol/L (ref 3.5–5.1)
POTASSIUM: 4.2 mmol/L (ref 3.5–5.1)
Sodium: 149 mmol/L — ABNORMAL HIGH (ref 135–145)
Sodium: 148 mmol/L — ABNORMAL HIGH (ref 135–145)

## 2014-10-09 LAB — CBC
HCT: 42 % (ref 36.0–46.0)
HEMATOCRIT: 39.5 % (ref 36.0–46.0)
HEMOGLOBIN: 12.3 g/dL (ref 12.0–15.0)
Hemoglobin: 13.5 g/dL (ref 12.0–15.0)
MCH: 23.6 pg — AB (ref 26.0–34.0)
MCH: 23.2 pg — ABNORMAL LOW (ref 26.0–34.0)
MCHC: 32.1 g/dL (ref 30.0–36.0)
MCHC: 31.1 g/dL (ref 30.0–36.0)
MCV: 73.3 fL — ABNORMAL LOW (ref 78.0–100.0)
MCV: 74.5 fL — ABNORMAL LOW (ref 78.0–100.0)
Platelets: 211 10*3/uL (ref 150–400)
Platelets: 176 10*3/uL (ref 150–400)
RBC: 5.73 MIL/uL — AB (ref 3.87–5.11)
RBC: 5.3 MIL/uL — AB (ref 3.87–5.11)
RDW: 15.8 % — ABNORMAL HIGH (ref 11.5–15.5)
RDW: 15.6 % — AB (ref 11.5–15.5)
WBC: 15.1 10*3/uL — AB (ref 4.0–10.5)
WBC: 20.1 10*3/uL — AB (ref 4.0–10.5)

## 2014-10-09 LAB — URINALYSIS, ROUTINE W REFLEX MICROSCOPIC
BILIRUBIN URINE: NEGATIVE
Glucose, UA: >1000 mg/dL — AB
Hgb urine dipstick: NEGATIVE
Ketones, ur: >80 mg/dL — AB
Leukocytes, UA: NEGATIVE
NITRITE: NEGATIVE
PROTEIN: NEGATIVE mg/dL
Specific Gravity, Urine: 1.04 — ABNORMAL HIGH (ref 1.005–1.030)
UROBILINOGEN UA: 0.2 mg/dL (ref 0.0–1.0)
pH: 5.5 (ref 5.0–8.0)

## 2014-10-09 LAB — CBG MONITORING, ED
GLUCOSE-CAPILLARY: 323 mg/dL — AB (ref 70–99)
Glucose-Capillary: 357 mg/dL — ABNORMAL HIGH (ref 70–99)
Glucose-Capillary: 367 mg/dL — ABNORMAL HIGH (ref 70–99)

## 2014-10-09 LAB — URINE MICROSCOPIC-ADD ON: URINE-OTHER: NONE SEEN

## 2014-10-09 LAB — GLUCOSE, CAPILLARY: Glucose-Capillary: 255 mg/dL — ABNORMAL HIGH (ref 70–99)

## 2014-10-09 LAB — LIPASE, BLOOD: Lipase: 16 U/L (ref 11–59)

## 2014-10-09 MED ORDER — GABAPENTIN 300 MG PO CAPS
300.0000 mg | ORAL_CAPSULE | Freq: Three times a day (TID) | ORAL | Status: DC
  Filled 2014-10-09: qty 1

## 2014-10-09 MED ORDER — METOCLOPRAMIDE HCL 5 MG/ML IJ SOLN
10.0000 mg | Freq: Once | INTRAMUSCULAR | Status: AC
  Administered 2014-10-09: 10 mg via INTRAVENOUS
  Filled 2014-10-09: qty 2

## 2014-10-09 MED ORDER — DIVALPROEX SODIUM 500 MG PO DR TAB
500.0000 mg | DELAYED_RELEASE_TABLET | Freq: Two times a day (BID) | ORAL | Status: DC
  Administered 2014-10-09 – 2014-10-11 (×5): 500 mg via ORAL
  Filled 2014-10-09 (×7): qty 1

## 2014-10-09 MED ORDER — METOCLOPRAMIDE HCL 5 MG/ML IJ SOLN
10.0000 mg | Freq: Four times a day (QID) | INTRAMUSCULAR | Status: DC
  Administered 2014-10-09 – 2014-10-11 (×7): 10 mg via INTRAVENOUS
  Filled 2014-10-09 (×16): qty 2

## 2014-10-09 MED ORDER — HYDROMORPHONE HCL 1 MG/ML IJ SOLN
1.0000 mg | Freq: Once | INTRAMUSCULAR | Status: AC
  Administered 2014-10-09: 1 mg via INTRAVENOUS
  Filled 2014-10-09: qty 1

## 2014-10-09 MED ORDER — SODIUM CHLORIDE 0.9 % IV BOLUS (SEPSIS)
1000.0000 mL | Freq: Once | INTRAVENOUS | Status: AC
Start: 2014-10-09 — End: 2014-10-09
  Administered 2014-10-09: 1000 mL via INTRAVENOUS

## 2014-10-09 MED ORDER — GABAPENTIN 300 MG PO CAPS
300.0000 mg | ORAL_CAPSULE | Freq: Three times a day (TID) | ORAL | Status: DC
  Administered 2014-10-09 – 2014-10-12 (×8): 300 mg via ORAL
  Filled 2014-10-09 (×10): qty 1

## 2014-10-09 MED ORDER — SODIUM CHLORIDE 0.9 % IV SOLN: INTRAVENOUS | Status: DC

## 2014-10-09 MED ORDER — DIVALPROEX SODIUM 500 MG PO DR TAB
500.0000 mg | DELAYED_RELEASE_TABLET | Freq: Two times a day (BID) | ORAL | Status: DC
Start: 2014-10-09 — End: 2014-10-09

## 2014-10-09 MED ORDER — ENOXAPARIN SODIUM 40 MG/0.4ML ~~LOC~~ SOLN
40.0000 mg | SUBCUTANEOUS | Status: DC
  Administered 2014-10-09 – 2014-10-11 (×3): 40 mg via SUBCUTANEOUS
  Filled 2014-10-09 (×4): qty 0.4

## 2014-10-09 MED ORDER — ONDANSETRON HCL 4 MG/2ML IJ SOLN
4.0000 mg | Freq: Once | INTRAMUSCULAR | Status: AC
  Administered 2014-10-09: 4 mg via INTRAVENOUS
  Filled 2014-10-09: qty 2

## 2014-10-09 MED ORDER — RISPERIDONE 2 MG PO TABS
2.0000 mg | ORAL_TABLET | Freq: Every day | ORAL | Status: DC
  Administered 2014-10-09 – 2014-10-11 (×3): 2 mg via ORAL
  Filled 2014-10-09 (×4): qty 1

## 2014-10-09 MED ORDER — SODIUM CHLORIDE 0.9 % IV SOLN
INTRAVENOUS | Status: DC
  Administered 2014-10-09: 19:00:00 via INTRAVENOUS

## 2014-10-09 MED ORDER — PANTOPRAZOLE SODIUM 40 MG IV SOLR
40.0000 mg | Freq: Once | INTRAVENOUS | Status: AC
  Administered 2014-10-09: 40 mg via INTRAVENOUS
  Filled 2014-10-09: qty 40

## 2014-10-09 MED ORDER — SODIUM CHLORIDE 0.9 % IV BOLUS (SEPSIS)
1000.0000 mL | Freq: Once | INTRAVENOUS | Status: AC
  Administered 2014-10-09: 1000 mL via INTRAVENOUS

## 2014-10-09 MED ORDER — SODIUM CHLORIDE 0.9 % IV SOLN
INTRAVENOUS | Status: DC
  Administered 2014-10-09: 3.1 [IU]/h via INTRAVENOUS
  Filled 2014-10-09: qty 2.5

## 2014-10-09 MED ORDER — MORPHINE SULFATE 2 MG/ML IJ SOLN
2.0000 mg | INTRAMUSCULAR | Status: DC | PRN
  Administered 2014-10-09 – 2014-10-10 (×6): 2 mg via INTRAVENOUS
  Filled 2014-10-09 (×6): qty 1

## 2014-10-09 MED ORDER — DEXTROSE-NACL 5-0.45 % IV SOLN
INTRAVENOUS | Status: DC
  Administered 2014-10-09: 22:00:00 via INTRAVENOUS

## 2014-10-09 MED ORDER — DEXTROSE-NACL 5-0.45 % IV SOLN: INTRAVENOUS | Status: DC

## 2014-10-09 MED ORDER — POTASSIUM CHLORIDE 10 MEQ/100ML IV SOLN
10.0000 meq | INTRAVENOUS | Status: AC
  Administered 2014-10-09: 10 meq via INTRAVENOUS
  Filled 2014-10-09 (×3): qty 100

## 2014-10-09 MED ORDER — SODIUM CHLORIDE 0.9 % IV SOLN
INTRAVENOUS | Status: DC
  Administered 2014-10-09: 5.3 [IU]/h via INTRAVENOUS
  Filled 2014-10-09: qty 2.5

## 2014-10-09 MED ORDER — LORAZEPAM 2 MG/ML IJ SOLN
1.0000 mg | Freq: Once | INTRAMUSCULAR | Status: AC
  Administered 2014-10-09: 1 mg via INTRAVENOUS
  Filled 2014-10-09: qty 1

## 2014-10-09 NOTE — ED Notes (Signed)
Resting quietly with eye closed. Easily arousable. Verbally responsive. Resp even and unlabored. ABC's intact. NAD noted.  

## 2014-10-09 NOTE — ED Notes (Signed)
Resting quietly with eye closed. Easily arousable. Verbally responsive. Resp even and unlabored. ABC's intact. IV infusing NS at 286ml/hr and Insulin drip without difficulty. NAD noted.

## 2014-10-09 NOTE — ED Notes (Signed)
Will have another nurse to attempt IV. Unable to locate any veins for venipuncture.

## 2014-10-09 NOTE — ED Notes (Signed)
Awake. Verbally responsive. A/O x4. Resp even and unlabored. No audible adventitious breath sounds noted. ABC's intact. IV infusing NS 959ml/hr without difficulty.

## 2014-10-09 NOTE — ED Notes (Signed)
Attempting to draw labs. Pt starts yelling she needs an IV with pain meds. Pt then runs over to sink and drenches her head in water. Explained to pt she needs to get in bed so I can get some blood from her. Pt lays in bed calmly.

## 2014-10-09 NOTE — H&P (Addendum)
[facility] History and Physical  Mary [name] SVX:[id number] DOB: 12/12/1970 DOA: 10/09/2014   PCP: [name], NP    Chief Complaint: Vomiting and abdominal pain  HPI: Mary [name] is a 44 y.o. female with IDDM, bipolar disorder, gastroparesis, seizure disorder and multiple admissions for DKA and medication noncompliance. She goes to the [facility] and has an appointment tomorrow at 10 AM. He was last seen there in February and was asked to return in 2 weeks but it does not appear that she went back.She was admitted to the hospital in early March with DKA.  She states that she started vomiting yesterday and has been having severe generalized abdominal pain. No blood in the vomitus. She states she last took her insulin last night and has not had any insulin today. Currently she is in a lot of pain, writhing in the bed and not very forthcoming with her history. She states her last episode of vomiting was this morning. She has not had any fevers or chills. She has only had a small amount of stool. The patient was here on the 18th for abdominal pain as well and was discharged home from the ER. She was found to have mild DKA today for which she is being admitted along with pain control.   General: The patient denies anorexia, fever, weight loss Cardiac: Denies chest pain, syncope, palpitations, pedal edema  Respiratory: Denies cough, shortness of breath, wheezing GI: no indigestion/heartburn, + abdominal pain, +nausea/vomiting, no diarrhea and constipation GU: Denies hematuria, incontinence, dysuria  Musculoskeletal: Denies arthritis  Skin: Denies suspicious skin lesions Neurologic: Denies focal weakness or numbness, change in vision Psychiatry: + depression or anxiety. Hematologic: no easy bruising or bleeding   Past Medical History  Diagnosis Date  . Kidney stone   . Seizure disorder     started with pregnancy of first son  . Gastritis   . Bipolar 2  disorder   . Post traumatic stress disorder     "flipping out" after people close to her died  . Heart murmur   . Arthritis     r ankle-S/P surgery (2007)  . Anemia     childhood  . Bipolar disorder   . Hepatitis C     biopsy in 2010-no rx-was supposed to see a hepatologist in [place].  With cirrhosis  . Neuropathy     in legs and feet and hands  . Cataracts, bilateral   . Scoliosis   . Diabetes mellitus     diagnosed in 1996-always been on insulin  . DKA (diabetic ketoacidoses)     Recurrent admissions for DKA, medication non-complaince, poor social situation  . Diabetic neuropathy, type I diabetes mellitus     numbness bilaterally feet  . HTN (hypertension) 10/12/2013    Past Surgical History  Procedure Laterality Date  . Ankle surgery  2007  . Endometrial biopsy  06/22/2012  . Cholecystectomy  04/07/2013  . Cholecystectomy N/A 04/07/2013    Procedure: LAPAROSCOPIC CHOLECYSTECTOMY WITH INTRAOPERATIVE CHOLANGIOGRAM;  Surgeon: Adin Hector, MD;  Location: [facility];  Service: General;  Laterality: N/A;  . Cesarean section      3 times    Social History: smokes about 3 cigarettes per day- does not drink alcohol Lives at home alone. Does not work.     Allergies  Allergen Reactions  . Penicillins Rash    Family History  Problem Relation Age of Onset  . Diabetes Mother     currently  17  . Fibromyalgia Mother   . Cirrhosis Father     died in 68  . Diabetes Maternal Grandmother   . Diabetes Maternal Aunt      Prior to Admission medications   Medication Sig Start Date End Date Taking? Authorizing Provider  aspirin EC 81 MG tablet Take 81 mg by mouth daily.   Yes Historical Provider, MD  divalproex (DEPAKOTE) 500 MG DR tablet Take 1 tablet (500 mg total) by mouth 2 times daily at 12 noon and 4 pm. 08/17/14  Yes Orson Eva, MD  gabapentin (NEURONTIN) 300 MG capsule Take 300 mg by mouth 3 (three) times daily.   Yes Historical Provider, MD  Insulin Glargine  (LANTUS SOLOSTAR) 100 UNIT/ML Solostar Pen Inject 35 Units into the skin daily. 08/17/14  Yes Orson Eva, MD  insulin NPH-regular Human (NOVOLIN 70/30) (70-30) 100 UNIT/ML injection Inject 8 Units into the skin 2 (two) times daily with a meal. 07/31/14  Yes Costin Karlyne Greenspan, MD  medroxyPROGESTERone (DEPO-PROVERA) 150 MG/ML injection Inject 150 mg into the muscle every 3 (three) months.   Yes Historical Provider, MD  risperiDONE (RISPERDAL) 2 MG tablet Take 2 mg by mouth at bedtime.   Yes Historical Provider, MD  blood glucose meter kit and supplies KIT Dispense based on patient and insurance preference. Use up to four times daily as directed. (FOR ICD-9 250.00, 250.01). Patient not taking: Reported on 09/27/2014 08/17/14   Orson Eva, MD  dicyclomine (BENTYL) 20 MG tablet Take 1 tablet (20 mg total) by mouth 2 (two) times daily. Patient not taking: Reported on 10/09/2014 10/03/14   Carmin Muskrat, MD  glucose blood test strip Check blood sugar four times daily for E10.65 Patient not taking: Reported on 09/27/2014 08/10/14   Lance Bosch, NP  Insulin Syringe-Needle U-100 (INSULIN SYRINGE 1CC/28G) 28G X 1/2" 1 ML MISC Use NPH twice daily for E10.65 Patient not taking: Reported on 09/27/2014 08/10/14   Lance Bosch, NP  Lancets (FREESTYLE) lancets Check sugar four times per day for E10.65 Patient not taking: Reported on 09/27/2014 08/10/14   Lance Bosch, NP  metoCLOPramide (REGLAN) 10 MG tablet Take 1 tablet (10 mg total) by mouth every 6 (six) hours. Patient not taking: Reported on 10/09/2014 09/03/14   Dahlia Bailiff, PA-C  ondansetron (ZOFRAN) 4 MG tablet Take 1 tablet (4 mg total) by mouth every 6 (six) hours. Patient not taking: Reported on 09/03/2014 06/08/14   Theodis Blaze, MD  oxyCODONE-acetaminophen (PERCOCET/ROXICET) 5-325 MG per tablet Take 1-2 tablets by mouth every 3 (three) hours as needed for moderate pain. Patient not taking: Reported on 09/03/2014 07/31/14   Caren Griffins, MD  polyethylene glycol  (MIRALAX / Floria Raveling) packet Take 17 g by mouth daily as needed. Patient not taking: Reported on 09/03/2014 07/31/14   Caren Griffins, MD  promethazine (PHENERGAN) 25 MG suppository Place 1 suppository (25 mg total) rectally every 6 (six) hours as needed for nausea or vomiting. 10/03/14   Carmin Muskrat, MD     Physical Exam: Filed Vitals:   10/09/14 1530 10/09/14 1600 10/09/14 1630 10/09/14 1700  BP: 168/80 153/79 152/84 154/84  Pulse: 121 113 119 125  Temp:      TempSrc:      Resp:      Height:      Weight:      SpO2: 100% 100% 100% 100%     General: Awake alert oriented, in significant amount of distress due to abdominal pain HEENT:  Normocephalic and Atraumatic, Mucous membranes pink- oral mucosa dry                PERRLA; EOM intact; No scleral icterus,                 Nares: Patent, Oropharynx: Clear, Fair Dentition                 Neck: FROM, no cervical lymphadenopathy, thyromegaly, carotid bruit or JVD;  Breasts: deferred CHEST WALL: No tenderness  CHEST: Normal respiration, clear to auscultation bilaterally  HEART: Regular rate and rhythm; no murmurs rubs or gallops  BACK: No kyphosis or scoliosis; no CVA tenderness  GI: Positive Bowel Sounds, soft, diffusely tender; no masses, no organomegaly Rectal Exam: deferred MSK: No cyanosis, clubbing, or edema Genitalia: not examined  SKIN:  no rash or ulceration  CNS: Alert and Oriented x 4, Nonfocal exam, CN 2-12 intact  Labs on Admission:  Basic Metabolic Panel:  Recent Labs Lab 10/03/14 0839 10/09/14 1500  NA 140 148*  K 3.6 4.7  CL 104 115*  CO2 21 16*  GLUCOSE 332* 399*  BUN 15 15  CREATININE 0.56 0.68  CALCIUM 9.5 9.3   Liver Function Tests:  Recent Labs Lab 10/03/14 0839 10/09/14 1500  AST 35 35  ALT 39* 38*  ALKPHOS 82 84  BILITOT 0.9 1.0  PROT 7.6 7.1  ALBUMIN 4.2 3.9    Recent Labs Lab 10/09/14 1500  LIPASE 16   No results for input(s): AMMONIA in the last 168 hours. CBC:  Recent  Labs Lab 10/03/14 0839 10/09/14 1120  WBC 13.6* 15.1*  NEUTROABS 9.4*  --   HGB 12.7 13.5  HCT 40.8 42.0  MCV 73.2* 73.3*  PLT 155 211   Cardiac Enzymes: No results for input(s): CKTOTAL, CKMB, CKMBINDEX, TROPONINI in the last 168 hours.  BNP (last 3 results) No results for input(s): BNP in the last 8760 hours.  ProBNP (last 3 results) No results for input(s): PROBNP in the last 8760 hours.  CBG:  Recent Labs Lab 10/09/14 1532 10/09/14 1620  GLUCAP 357* 367*    Radiological Exams on Admission: No results found.   Assessment/Plan Principal Problem:   DKA (diabetic ketoacidoses) - Has been started on an insulin infusion -Continue to follow b met every 4 hours -IV fluids at 125 mL an hour- she had 600 mL of urine in the ER  Active Problems: Abdominal pain/nausea vomiting-likely gastroparesis - She tells me she currently does not have any Reglan to take at home - We'll start Reglan 10 mg every 6 hours routinely via IV route -PRN morphine for pain -will need to give her prescription when she is discharged  Hypertension/tachycardia -BP has been about 150s over 70s and 80s - heart rate in 120s - currently it may be related to pain- need to continue to follow through her hospital stay  Dehydration - Noted on exam - has an elevated urine specific gravity - We'll continue IV fluids  Leukocytosis -Likely stress response to DKA- no signs or symptoms of infection    Seizure disorder -Continue oral Depakote as tolerated    Tobacco abuse - Smokes 3 cigarettes a day-hold off on nicotine patch  Hepatitis C  NOTE: will need to make new appt with [facility] as she will likely miss her appt tomorrow at 10 AM  Consulted:   Code Status: full code Family Communication:   DVT Prophylaxis:Lovenox  Time spent: 50 min  [place],  MD [facility]  If 7PM-7AM, please contact night-coverage [email or web address] 10/09/2014, 5:29 PM

## 2014-10-09 NOTE — ED Notes (Signed)
Awake. Verbally responsive. A/O x4. Resp even and unlabored. No audible adventitious breath sounds noted. ABC's intact.  

## 2014-10-09 NOTE — ED Notes (Signed)
Attempted 2x for lab draws, was unsuccessful. Notified [facility]le[facility]. Didn't realize Manuela Schwartz was going to attempt an [place] IV.

## 2014-10-09 NOTE — ED Notes (Addendum)
Nurse starting IV 

## 2014-10-09 NOTE — ED Provider Notes (Addendum)
CSN: [id number]     Arrival date & time 10/09/14  1014 History   First MD Initiated Contact with Patient 10/09/14 1051     Chief Complaint  Patient presents with  . Abdominal Pain     (Consider location/radiation/quality/duration/timing/severity/associated sxs/prior Treatment) Patient is a 44 y.o. female presenting with abdominal pain. The history is provided by the patient.  Abdominal Pain Associated symptoms: diarrhea, nausea and vomiting   Associated symptoms: no chest pain, no chills, no cough, no dysuria, no fever, no shortness of breath, no sore throat, no vaginal bleeding and no vaginal discharge   Patient w hx iddm, presents w nvd in the past 1-2 days. Multiple episodes emesis. States clear or color ingested fluids, not bloody or bilious. Diarrhea loose to watery, not bloody. Intermittent diffuse, crampy, abd pain, mod-severe -  No constant or focal abd pain. No dysuria or hematuria. No vaginal discharge or bleeding. Hx cholecystectomy. Denies abd distension or recent constipation.  No fever or chills. No cough or uri c/o. No headaches.      Past Medical History  Diagnosis Date  . Kidney stone   . Seizure disorder     started with pregnancy of first son  . Gastritis   . Bipolar 2 disorder   . Post traumatic stress disorder     "flipping out" after people close to her died  . Heart murmur   . Arthritis     r ankle-S/P surgery (2007)  . Anemia     childhood  . Depression     childhood  . Hepatitis C     biopsy in 2010-no rx-was supposed to see a hepatologist in [place].  With cirrhosis  . Neuropathy     in legs and feet and hands  . Cataracts, bilateral   . Scoliosis   . Diabetes mellitus     diagnosed in 1996-always been on insulin  . DKA (diabetic ketoacidoses)     Recurrent admissions for DKA, medication non-complaince, poor social situation  . Diabetic neuropathy, type I diabetes mellitus     numbness bilaterally feet  . HTN (hypertension) 10/12/2013    Past Surgical History  Procedure Laterality Date  . Ankle surgery  2007  . Endometrial biopsy  06/22/2012  . Cholecystectomy  04/07/2013  . Cholecystectomy N/A 04/07/2013    Procedure: LAPAROSCOPIC CHOLECYSTECTOMY WITH INTRAOPERATIVE CHOLANGIOGRAM;  Surgeon: Adin Hector, MD;  Location: [facility];  Service: General;  Laterality: N/A;  . Cesarean section      3 times   Family History  Problem Relation Age of Onset  . Diabetes Mother     currently 72  . Fibromyalgia Mother   . Cirrhosis Father     died in 58  . Diabetes Maternal Grandmother   . Diabetes Maternal Aunt    History  Substance Use Topics  . Smoking status: Current Every Day Smoker -- 0.10 packs/day for 25 years    Types: Cigarettes  . Smokeless tobacco: Never Used  . Alcohol Use: No     Comment: Endorses hasn't been drinking   OB History    Gravida Para Term Preterm AB TAB SAB Ectopic Multiple Living   '4 3 3 '$ 0 1 0 1 0 1 2     Review of Systems  Constitutional: Negative for fever and chills.  HENT: Negative for sore throat.   Eyes: Negative for redness.  Respiratory: Negative for cough and shortness of breath.   Cardiovascular: Negative for chest pain and leg swelling.  Gastrointestinal: Positive for nausea, vomiting, abdominal pain and diarrhea.  Endocrine: Negative for polyuria.  Genitourinary: Negative for dysuria, flank pain, vaginal bleeding and vaginal discharge.  Musculoskeletal: Negative for back pain and neck pain.  Skin: Negative for rash.  Neurological: Negative for headaches.  Hematological: Does not bruise/bleed easily.  Psychiatric/Behavioral: Negative for confusion.      Allergies  Penicillins  Home Medications   Prior to Admission medications   Medication Sig Start Date End Date Taking? Authorizing Provider  aspirin EC 81 MG tablet Take 81 mg by mouth daily.    Historical Provider, MD  blood glucose meter kit and supplies KIT Dispense based on patient and insurance preference.  Use up to four times daily as directed. (FOR ICD-9 250.00, 250.01). Patient not taking: Reported on 09/27/2014 08/17/14   Orson Eva, MD  dicyclomine (BENTYL) 20 MG tablet Take 1 tablet (20 mg total) by mouth 2 (two) times daily. 10/03/14   Carmin Muskrat, MD  divalproex (DEPAKOTE) 500 MG DR tablet Take 1 tablet (500 mg total) by mouth 2 times daily at 12 noon and 4 pm. 08/17/14   Orson Eva, MD  gabapentin (NEURONTIN) 300 MG capsule Take 300 mg by mouth 3 (three) times daily.    Historical Provider, MD  glucose blood test strip Check blood sugar four times daily for E10.65 Patient not taking: Reported on 09/27/2014 08/10/14   Lance Bosch, NP  Insulin Glargine (LANTUS SOLOSTAR) 100 UNIT/ML Solostar Pen Inject 35 Units into the skin daily. 08/17/14   Orson Eva, MD  insulin NPH-regular Human (NOVOLIN 70/30) (70-30) 100 UNIT/ML injection Inject 8 Units into the skin 2 (two) times daily with a meal. 07/31/14   Costin Karlyne Greenspan, MD  Insulin Pen Needle (1ST CHOICE PEN NEEDLES) 31G X 8 MM MISC Take insulin nightly for E10.65 Patient not taking: Reported on 09/27/2014 08/10/14   Lance Bosch, NP  Insulin Syringe-Needle U-100 (INSULIN SYRINGE 1CC/28G) 28G X 1/2" 1 ML MISC Use NPH twice daily for E10.65 Patient not taking: Reported on 09/27/2014 08/10/14   Lance Bosch, NP  Lancets (FREESTYLE) lancets Check sugar four times per day for E10.65 Patient not taking: Reported on 09/27/2014 08/10/14   Lance Bosch, NP  medroxyPROGESTERone (DEPO-PROVERA) 150 MG/ML injection Inject 150 mg into the muscle every 3 (three) months.    Historical Provider, MD  metoCLOPramide (REGLAN) 10 MG tablet Take 1 tablet (10 mg total) by mouth every 6 (six) hours. 09/03/14   Dahlia Bailiff, PA-C  ondansetron (ZOFRAN) 4 MG tablet Take 1 tablet (4 mg total) by mouth every 6 (six) hours. Patient not taking: Reported on 09/03/2014 06/08/14   Theodis Blaze, MD  oxyCODONE-acetaminophen (PERCOCET/ROXICET) 5-325 MG per tablet Take 1-2 tablets by mouth  every 3 (three) hours as needed for moderate pain. Patient not taking: Reported on 09/03/2014 07/31/14   Caren Griffins, MD  polyethylene glycol (MIRALAX / Floria Raveling) packet Take 17 g by mouth daily as needed. Patient not taking: Reported on 09/03/2014 07/31/14   Caren Griffins, MD  promethazine (PHENERGAN) 25 MG suppository Place 1 suppository (25 mg total) rectally every 6 (six) hours as needed for nausea or vomiting. 10/03/14   Carmin Muskrat, MD  risperiDONE (RISPERDAL) 2 MG tablet Take 2 mg by mouth at bedtime.    Historical Provider, MD   BP 154/72 mmHg  Pulse 102  Temp(Src) 98 F (36.7 C) (Oral)  Resp 18  Ht $R'5\' 4"'mT$  (1.626 m)  Wt 120 lb (54.432  kg)  BMI 20.59 kg/m2  SpO2 99% Physical Exam  Constitutional: She appears well-developed and well-nourished. No distress.  Very anxious, tearful.   HENT:  Mouth/Throat: Oropharynx is clear and moist.  Eyes: Conjunctivae are normal. No scleral icterus.  Neck: Neck supple. No tracheal deviation present.  No stiffness or rigidity  Cardiovascular: Normal rate, regular rhythm, normal heart sounds and intact distal pulses.   Pulmonary/Chest: Effort normal and breath sounds normal. No respiratory distress.  Abdominal: Soft. Normal appearance and bowel sounds are normal. She exhibits no distension and no mass. There is tenderness. There is no rebound and no guarding.  Epigastric and mid abd tenderness, no rebound or guarding. No incarc hernia.   Genitourinary:  No cva tenderness  Musculoskeletal: She exhibits no edema.  Neurological: She is alert.  Skin: Skin is warm and dry. No rash noted.  Psychiatric:  Anxious.   Nursing note and vitals reviewed.   ED Course  Procedures (including critical care time) Labs Review   Results for orders placed or performed during the hospital encounter of 10/09/14  CBC  Result Value Ref Range   WBC 15.1 (H) 4.0 - 10.5 K/uL   RBC 5.73 (H) 3.87 - 5.11 MIL/uL   Hemoglobin 13.5 12.0 - 15.0 g/dL   HCT  42.0 36.0 - 46.0 %   MCV 73.3 (L) 78.0 - 100.0 fL   MCH 23.6 (L) 26.0 - 34.0 pg   MCHC 32.1 30.0 - 36.0 g/dL   RDW 15.8 (H) 11.5 - 15.5 %   Platelets 211 150 - 400 K/uL  Comprehensive metabolic panel  Result Value Ref Range   GFR calc non Af Amer NOT CALCULATED >90 mL/min   GFR calc Af Amer NOT CALCULATED >90 mL/min  Urinalysis, Routine w reflex microscopic  Result Value Ref Range   Color, Urine YELLOW YELLOW   APPearance CLEAR CLEAR   Specific Gravity, Urine 1.040 (H) 1.005 - 1.030   pH 5.5 5.0 - 8.0   Glucose, UA >1000 (A) NEGATIVE mg/dL   Hgb urine dipstick NEGATIVE NEGATIVE   Bilirubin Urine NEGATIVE NEGATIVE   Ketones, ur >80 (A) NEGATIVE mg/dL   Protein, ur NEGATIVE NEGATIVE mg/dL   Urobilinogen, UA 0.2 0.0 - 1.0 mg/dL   Nitrite NEGATIVE NEGATIVE   Leukocytes, UA NEGATIVE NEGATIVE  Urine microscopic-add on  Result Value Ref Range   Urine-Other      NO FORMED ELEMENTS SEEN ON URINE MICROSCOPIC EXAMINATION      MDM   Iv ns bolus. Pt requests pain med. Dilaudid iv, zofran iv, protonix iv.  Reviewed nursing notes and prior charts for additional history.   On review charts, hx recurrent abd pain and nvd, hx dm and gastroparesis. reglan iv.  Pt also appears anxious, notes hx same. Ativan 1 mg iv.  Recheck pt much more comfortable. No recurrent nvd.  Additional ivf.  Chemistries still pending.  Recheck, abd soft nt.  Chemistries still pending.   Lab called at 1430 - they indicate chemistries must be re-drawn and re-ordered, no results available.   Labs re-ordered/staff to re-draw.  Pt informed of delay.  As dleay in chem, and intiial bs elevated, will recheck cbg while labs pending, insulin gtt per glucostabilizer.  Suspect pt with dka, hco3/chemistries pending. abd soft nt on recheck, afeb. 1620 chemistries pending - signed out to oncoming EDP, Dr Roderic Palau, to check CMET/lipase when back, and contact hospitalists for admission.      Lajean Saver, MD 10/09/14  [phone]

## 2014-10-09 NOTE — ED Notes (Signed)
Pt has door closed and is screaming "Help". Pt states she is in pain and needs the IV. Explained RN was in another room and will be with her as soon as she is finished.

## 2014-10-09 NOTE — ED Notes (Signed)
Awake. Verbally responsive. Resp even and unlabored. No audible adventitious breath sounds noted. ABC's intact. Abd soft/nondistended and tender. BS (+) and active x4 quadrants. Pt reported n/v/d.

## 2014-10-09 NOTE — ED Notes (Signed)
Bed: KW40 Expected date: 10/09/14 Expected time: 10:10 AM Means of arrival: Ambulance Comments: N/V/D

## 2014-10-09 NOTE — ED Notes (Signed)
Pt arrived via EMS on stretcher with report of N/V/D and intermittent diffuse abd pain. Pt denies any blood in emesis/stool. BS (+) and hyperactive x4 quadrants.

## 2014-10-10 DIAGNOSIS — K3184 Gastroparesis: Secondary | ICD-10-CM

## 2014-10-10 LAB — CBC
HCT: 38.1 % (ref 36.0–46.0)
HEMOGLOBIN: 12 g/dL (ref 12.0–15.0)
MCH: 23.3 pg — ABNORMAL LOW (ref 26.0–34.0)
MCHC: 31.5 g/dL (ref 30.0–36.0)
MCV: 74 fL — ABNORMAL LOW (ref 78.0–100.0)
PLATELETS: 158 10*3/uL (ref 150–400)
RBC: 5.15 MIL/uL — AB (ref 3.87–5.11)
RDW: 15.6 % — AB (ref 11.5–15.5)
WBC: 15.5 10*3/uL — ABNORMAL HIGH (ref 4.0–10.5)

## 2014-10-10 LAB — BASIC METABOLIC PANEL
ANION GAP: 9 (ref 5–15)
ANION GAP: 9 (ref 5–15)
ANION GAP: 6 (ref 5–15)
Anion gap: 7 (ref 5–15)
BUN: 16 mg/dL (ref 6–23)
BUN: 16 mg/dL (ref 6–23)
BUN: 12 mg/dL (ref 6–23)
BUN: 16 mg/dL (ref 6–23)
CALCIUM: 9.4 mg/dL (ref 8.4–10.5)
CHLORIDE: 118 mmol/L — AB (ref 96–112)
CO2: 23 mmol/L (ref 19–32)
CO2: 24 mmol/L (ref 19–32)
CO2: 24 mmol/L (ref 19–32)
CO2: 25 mmol/L (ref 19–32)
Calcium: 9.6 mg/dL (ref 8.4–10.5)
Calcium: 9.1 mg/dL (ref 8.4–10.5)
Calcium: 8.8 mg/dL (ref 8.4–10.5)
Chloride: 115 mmol/L — ABNORMAL HIGH (ref 96–112)
Chloride: 113 mmol/L — ABNORMAL HIGH (ref 96–112)
Chloride: 110 mmol/L (ref 96–112)
Creatinine, Ser: 0.59 mg/dL (ref 0.50–1.10)
Creatinine, Ser: 0.53 mg/dL (ref 0.50–1.10)
Creatinine, Ser: 0.58 mg/dL (ref 0.50–1.10)
Creatinine, Ser: 0.58 mg/dL (ref 0.50–1.10)
GFR calc Af Amer: >90 mL/min (ref >90)
GFR calc non Af Amer: >90 mL/min (ref >90)
GFR calc non Af Amer: >90 mL/min (ref >90)
GFR calc non Af Amer: >90 mL/min (ref >90)
Glucose, Bld: 160 mg/dL — ABNORMAL HIGH (ref 70–99)
Glucose, Bld: 129 mg/dL — ABNORMAL HIGH (ref 70–99)
Glucose, Bld: 227 mg/dL — ABNORMAL HIGH (ref 70–99)
Glucose, Bld: 253 mg/dL — ABNORMAL HIGH (ref 70–99)
Potassium: 3.8 mmol/L (ref 3.5–5.1)
Potassium: 4.1 mmol/L (ref 3.5–5.1)
Potassium: 3.7 mmol/L (ref 3.5–5.1)
Potassium: 3.6 mmol/L (ref 3.5–5.1)
SODIUM: 149 mmol/L — AB (ref 135–145)
SODIUM: 149 mmol/L — AB (ref 135–145)
Sodium: 145 mmol/L (ref 135–145)
Sodium: 140 mmol/L (ref 135–145)

## 2014-10-10 LAB — GLUCOSE, CAPILLARY
GLUCOSE-CAPILLARY: 280 mg/dL — AB (ref 70–99)
GLUCOSE-CAPILLARY: 160 mg/dL — AB (ref 70–99)
GLUCOSE-CAPILLARY: 131 mg/dL — AB (ref 70–99)
GLUCOSE-CAPILLARY: 144 mg/dL — AB (ref 70–99)
GLUCOSE-CAPILLARY: 256 mg/dL — AB (ref 70–99)
GLUCOSE-CAPILLARY: 156 mg/dL — AB (ref 70–99)
GLUCOSE-CAPILLARY: 251 mg/dL — AB (ref 70–99)
Glucose-Capillary: 128 mg/dL — ABNORMAL HIGH (ref 70–99)
Glucose-Capillary: 141 mg/dL — ABNORMAL HIGH (ref 70–99)
Glucose-Capillary: 143 mg/dL — ABNORMAL HIGH (ref 70–99)
Glucose-Capillary: 163 mg/dL — ABNORMAL HIGH (ref 70–99)
Glucose-Capillary: 214 mg/dL — ABNORMAL HIGH (ref 70–99)
Glucose-Capillary: 245 mg/dL — ABNORMAL HIGH (ref 70–99)

## 2014-10-10 MED ORDER — CETYLPYRIDINIUM CHLORIDE 0.05 % MT LIQD
7.0000 mL | Freq: Two times a day (BID) | OROMUCOSAL | Status: DC
  Administered 2014-10-10 – 2014-10-11 (×4): 7 mL via OROMUCOSAL

## 2014-10-10 MED ORDER — INSULIN ASPART 100 UNIT/ML ~~LOC~~ SOLN
0.0000 [IU] | Freq: Three times a day (TID) | SUBCUTANEOUS | Status: AC
  Administered 2014-10-10: 2 [IU] via SUBCUTANEOUS

## 2014-10-10 MED ORDER — INSULIN ASPART PROT & ASPART (70-30 MIX) 100 UNIT/ML ~~LOC~~ SUSP
8.0000 [IU] | Freq: Two times a day (BID) | SUBCUTANEOUS | Status: DC
  Administered 2014-10-11 (×2): 8 [IU] via SUBCUTANEOUS
  Filled 2014-10-10: qty 10

## 2014-10-10 MED ORDER — INSULIN GLARGINE 100 UNIT/ML ~~LOC~~ SOLN
35.0000 [IU] | Freq: Every day | SUBCUTANEOUS | Status: DC
  Administered 2014-10-10: 35 [IU] via SUBCUTANEOUS
  Filled 2014-10-10: qty 0.35

## 2014-10-10 MED ORDER — INSULIN ASPART 100 UNIT/ML ~~LOC~~ SOLN
0.0000 [IU] | Freq: Three times a day (TID) | SUBCUTANEOUS | Status: DC
  Administered 2014-10-10: 8 [IU] via SUBCUTANEOUS
  Administered 2014-10-10: 2 [IU] via SUBCUTANEOUS

## 2014-10-10 MED ORDER — CHLORHEXIDINE GLUCONATE 0.12 % MT SOLN
15.0000 mL | Freq: Two times a day (BID) | OROMUCOSAL | Status: DC
  Administered 2014-10-10 – 2014-10-12 (×4): 15 mL via OROMUCOSAL
  Filled 2014-10-10 (×7): qty 15

## 2014-10-10 MED ORDER — INSULIN ASPART 100 UNIT/ML ~~LOC~~ SOLN: 0.0000 [IU] | Freq: Every day | SUBCUTANEOUS | Status: DC

## 2014-10-10 MED ORDER — INSULIN ASPART 100 UNIT/ML ~~LOC~~ SOLN
0.0000 [IU] | Freq: Every day | SUBCUTANEOUS | Status: AC
  Administered 2014-10-10: 3 [IU] via SUBCUTANEOUS

## 2014-10-10 NOTE — Progress Notes (Addendum)
[facility] Progress Note   PRUDIE GUTHRIDGE YYT:[id number] DOB: Feb 09, 1971 DOA: 10/09/2014 PCP: Mary Manning, NP  Brief narrative: Mary [name] is a 44 y.o. female with IDDM, bipolar disorder, gastroparesis, seizure disorder and multiple admissions for DKA and medication noncompliance admitted with acute abdominal pain, vomiting and DKA.   Subjective: Continues to have some generalized abdominal pain. Is tolerating clear liquids. Would like to try full liquids. Has not had any vomiting or diarrhea.  Assessment/Plan: Principal Problem:   DKA (diabetic ketoacidoses) - Resolved-she states that she has a Lantus pen at home but it broke soon after she got it about a month ago and she has been managing her sugars with novolin 70/30 about 8 U BID - received Lantus when transitioned off of insulin infusion- will try to transition back to 70/30 which is what she takes at home- she is unable to afford medications  -Last A1c in 2/16 was 11.5-will recheck  Active Problems: -Vomiting and abdominal pain-most likely gastroparesis -She does not have Reglan at home because she states that she was not able to pay the $4 to fill the prescription at the [facility] wellness [facility] -Continue IV Reglan for now until she is tolerating solids -Working with case management to decide how to help her obtain her medications  Leukocytosis -WBC count improved from 20-15-not on antibiotics as no clear source of infection found -Continue to follow WBC count    Seizure disorder - Takes Depakote which she has at home    Tobacco abuse -Smokes 2-3 cigarettes a day-advised to discontinue  Bipolar disorder -Continue Risperdal and Depakote  Diabetic neuropathy -Continue Neurontin  Appt with PCP: Case management will work on getting an appointment with [facility] clinic Code Status: Full code Family Communication:  Disposition Plan: Home once able to tolerate solids and sugars  controlled DVT prophylaxis: Lovenox Consultants: Procedures:  Antibiotics: Anti-infectives    None      Objective: Filed Weights   10/09/14 1034 10/09/14 1756  Weight: 54.432 kg (120 lb) 60 kg (132 lb 4.4 oz)   No intake or output data in the 24 hours ending 10/10/14 1248   Vitals Filed Vitals:   10/09/14 1730 10/09/14 1756 10/09/14 2109 10/10/14 0551  BP: 150/69 173/81 161/93 138/80  Pulse: 122 129 114 104  Temp:  98.2 F (36.8 C) 98.4 F (36.9 C) 98.6 F (37 C)  TempSrc:  Oral Oral Oral  Resp:  20 20 20   Height:  5\' 4"  (1.626 m)    Weight:  60 kg (132 lb 4.4 oz)    SpO2: 100% 100% 100% 100%    Exam:  General:  Pt is alert, not in acute distress  HEENT: No icterus, No thrush  Cardiovascular: regular rate and rhythm, S1/S2 No murmur  Respiratory: clear to auscultation bilaterally   Abdomen: Soft, +Bowel sounds, moderate diffuse tenderness, non distended, no guarding  MSK: No LE edema, cyanosis or clubbing  Data Reviewed: Basic Metabolic Panel:  Recent Labs Lab 10/09/14 1818 10/09/14 2150 10/10/14 0053 10/10/14 0525 10/10/14 0911  NA 149* 148* 149* 149* 145  K 4.8 4.2 3.8 4.1 3.7  CL 118* 118* 118* 115* 113*  CO2 17* 22 24 25 23   GLUCOSE 299* 222* 160* 129* 227*  BUN 15 15 16 16 16   CREATININE 0.71 0.67 0.59 0.53 0.58  CALCIUM 9.5 9.5 9.6 9.4 9.1   Liver Function Tests:  Recent Labs Lab 10/09/14 1500  AST 35  ALT 38*  ALKPHOS 84  BILITOT 1.0  PROT 7.1  ALBUMIN 3.9    Recent Labs Lab 10/09/14 1500  LIPASE 16   No results for input(s): AMMONIA in the last 168 hours. CBC:  Recent Labs Lab 10/09/14 1120 10/09/14 2150 10/10/14 0525  WBC 15.1* 20.1* 15.5*  HGB 13.5 12.3 12.0  HCT 42.0 39.5 38.1  MCV 73.3* 74.5* 74.0*  PLT 211 176 158   Cardiac Enzymes: No results for input(s): CKTOTAL, CKMB, CKMBINDEX, TROPONINI in the last 168 hours. BNP (last 3 results) No results for input(s): BNP in the last 8760 hours.  ProBNP (last  3 results) No results for input(s): PROBNP in the last 8760 hours.  CBG:  Recent Labs Lab 10/10/14 0248 10/10/14 0349 10/10/14 0504 10/10/14 0718 10/10/14 1136  GLUCAP 131* 141* 128* 144* 256*    No results found for this or any previous visit (from the past 240 hour(s)).   Studies: No results found.  Scheduled Meds:  Scheduled Meds: . antiseptic oral rinse  7 mL Mouth Rinse q12n4p  . chlorhexidine  15 mL Mouth Rinse BID  . divalproex  500 mg Oral BID  . enoxaparin (LOVENOX) injection  40 mg Subcutaneous Q24H  . gabapentin  300 mg Oral TID  . insulin aspart  0-15 Units Subcutaneous TID WC  . insulin aspart  0-5 Units Subcutaneous QHS  . insulin glargine  35 Units Subcutaneous Daily  . metoCLOPramide (REGLAN) injection  10 mg Intravenous 4 times per day  . risperiDONE  2 mg Oral QHS   Continuous Infusions: . sodium chloride 75 mL/hr at 10/10/14 0544    Time spent on care of this patient: 35 minutes [place], MD 10/10/2014, 12:48 PM  LOS: 1 day   [facility] Office  [phone] Pager - Text Page per [email or web address]  If 7PM-7AM, please contact night-coverage [email or web address]

## 2014-10-10 NOTE — Progress Notes (Signed)
UR complete 

## 2014-10-10 NOTE — Progress Notes (Signed)
Inpatient Diabetes Program Recommendations  AACE/ADA: New Consensus Statement on Inpatient Glycemic Control (2013)  Target Ranges:  Prepandial:   less than 140 mg/dL      Peak postprandial:   less than 180 mg/dL (1-2 hours)      Critically ill patients:  140 - 180 mg/dL   Reason for Visit: DKA  Admitted with DKA. Not checking CBGs at home, Not taking insulin.  Patient well known to the Hospitalists and the Inpatient DM Program. Multiple ED visits, Multiple Admissions for DKA, Noncompliance with insulin, etc.   Has been counseled extensively in the past regarding the importance of blood sugar control at home.  Received  1x order for Lantus 35 units at [facility].  Will need Lantus order - 35 units QHS 70/30 8 units bid ordered. Decrease Novolog to sensitive tidwc and hs.  Results for Mary [name], Mary [name] (MRN [id number]) as of 10/10/2014 17:28  Ref. Range 10/10/2014 03:49 10/10/2014 05:04 10/10/2014 07:18 10/10/2014 11:36 10/10/2014 17:11  Glucose-Capillary Latest Ref Range: 70-99 mg/dL 141 (H) 128 (H) 144 (H) 256 (H) 156 (H)   Results for Mary [name], Mary [name] (MRN [id number]) as of 10/10/2014 17:28  Ref. Range 10/10/2014 14:05  Sodium Latest Ref Range: 135-145 mmol/L 140  Potassium Latest Ref Range: 3.5-5.1 mmol/L 3.6  Chloride Latest Ref Range: 96-112 mmol/L 110  CO2 Latest Ref Range: 19-32 mmol/L 24  BUN Latest Ref Range: 6-23 mg/dL 12  Creatinine Latest Ref Range: 0.50-1.10 mg/dL 0.58  Calcium Latest Ref Range: 8.4-10.5 mg/dL 8.8  EGFR (Non-African Amer.) Latest Ref Range: >90 mL/min >90  EGFR (African American) Latest Ref Range: >90 mL/min >90  Glucose Latest Ref Range: 70-99 mg/dL 253 (H)  Anion gap Latest Ref Range: 5-15  6   NOTE: will need to make new appt with [facility] as she will likely miss her appt tomorrow at 10 AM.  Will follow while inpatient. Thank you. Lorenda Peck, RD, LDN, CDE Inpatient Diabetes Coordinator [phone]

## 2014-10-11 DIAGNOSIS — E1065 Type 1 diabetes mellitus with hyperglycemia: Secondary | ICD-10-CM

## 2014-10-11 LAB — CBC
HCT: 36.3 % (ref 36.0–46.0)
Hemoglobin: 11.3 g/dL — ABNORMAL LOW (ref 12.0–15.0)
MCH: 22.9 pg — ABNORMAL LOW (ref 26.0–34.0)
MCHC: 31.1 g/dL (ref 30.0–36.0)
MCV: 73.6 fL — ABNORMAL LOW (ref 78.0–100.0)
Platelets: 145 10*3/uL — ABNORMAL LOW (ref 150–400)
RBC: 4.93 MIL/uL (ref 3.87–5.11)
RDW: 15.2 % (ref 11.5–15.5)
WBC: 10.6 10*3/uL — ABNORMAL HIGH (ref 4.0–10.5)

## 2014-10-11 LAB — HEMOGLOBIN A1C
Hgb A1c MFr Bld: 11.3 % — ABNORMAL HIGH (ref 4.8–5.6)
Mean Plasma Glucose: 278 mg/dL

## 2014-10-11 LAB — GLUCOSE, CAPILLARY
GLUCOSE-CAPILLARY: 308 mg/dL — AB (ref 70–99)
GLUCOSE-CAPILLARY: 317 mg/dL — AB (ref 70–99)
Glucose-Capillary: 269 mg/dL — ABNORMAL HIGH (ref 70–99)
Glucose-Capillary: 242 mg/dL — ABNORMAL HIGH (ref 70–99)

## 2014-10-11 MED ORDER — INSULIN ASPART PROT & ASPART (70-30 MIX) 100 UNIT/ML ~~LOC~~ SUSP
12.0000 [IU] | Freq: Two times a day (BID) | SUBCUTANEOUS | Status: DC
  Administered 2014-10-12: 12 [IU] via SUBCUTANEOUS
  Filled 2014-10-11: qty 10

## 2014-10-11 MED ORDER — BISACODYL 10 MG RE SUPP
10.0000 mg | Freq: Every day | RECTAL | Status: DC
  Administered 2014-10-11: 10 mg via RECTAL
  Filled 2014-10-11: qty 1

## 2014-10-11 MED ORDER — MORPHINE SULFATE 2 MG/ML IJ SOLN: 2.0000 mg | Freq: Four times a day (QID) | INTRAMUSCULAR | Status: DC | PRN

## 2014-10-11 MED ORDER — METOCLOPRAMIDE HCL 10 MG PO TABS
10.0000 mg | ORAL_TABLET | Freq: Three times a day (TID) | ORAL | Status: DC
  Administered 2014-10-11 – 2014-10-12 (×4): 10 mg via ORAL
  Filled 2014-10-11 (×8): qty 1

## 2014-10-11 NOTE — Progress Notes (Addendum)
[facility] Progress Note   CHAYE MISCH [id number]:[id number] DOB: 19-May-1971 DOA: 10/09/2014 PCP: [name], NP  Brief narrative: Mary [name] is a 44 y.o. female with IDDM, bipolar disorder, gastroparesis, seizure disorder and multiple admissions for DKA and medication noncompliance admitted with acute abdominal pain, vomiting and DKA.   Subjective: Abdominal pain has resolved. No nausea or vomiting. Ready to advance to solid diet. Advised to start cutting back on IV Morphine.   Assessment/Plan: Principal Problem:   DKA (diabetic ketoacidoses) - Resolved-she states that she has a Lantus pen at home but it broke soon after she got it about a month ago and she has been managing her sugars with novolin 70/30 about 8 U BID - received Lantus when transitioned off of insulin infusion - will try to transition back to 70/30 today which is what she takes at home- she is unable to afford medications - -Last A1c in 2/16 was 11.5-repeat A1c is 11.3 - have increased AM dose of 70/30 to 12 U which will start tomorrow-  will likely need to continue to increase slowly until adequate control acheived  Active Problems: -Vomiting and abdominal pain-most likely gastroparesis secondary to DM -She does not have Reglan at home because she states that she was not able to pay the $4 to fill the prescription at the [facility] wellness clinic -Transition to oral Reglan today TID AC and HS- give 30 min prior to meals - give Dulcolax suppository for constipation- important to cut back on Narcotics now -Working with case management to decide how to help her obtain her medications - as IV has infiltrated and RNs are unable to obtain another IV, I have discontinues Morphine  Leukocytosis -WBC count improved from 20-10-not on antibiotics as no clear source of infection found -Continue to follow WBC count    Seizure disorder - Takes Depakote which she has at home    Tobacco abuse -Smokes 2-3  cigarettes a day-advised to discontinue  Bipolar disorder -Continue Risperdal and Depakote  Diabetic neuropathy -Continue Neurontin  Appt with PCP: Case management will work on getting an appointment with [place] [facility] wellness clinic- will need to ensure she is able to obtain medications Code Status: Full code Family Communication:  Disposition Plan: Home once able to tolerate solids and sugars controlled DVT prophylaxis: Lovenox Consultants: Procedures:  Antibiotics: Anti-infectives    None      Objective: Filed Weights   10/09/14 1034 10/09/14 1756  Weight: 54.432 kg (120 lb) 60 kg (132 lb 4.4 oz)    Intake/Output Summary (Last 24 hours) at 10/11/14 0908 Last data filed at 10/11/14 0600  Gross per 24 hour  Intake 3689.58 ml  Output      0 ml  Net 3689.58 ml     Vitals Filed Vitals:   10/10/14 0551 10/10/14 1458 10/10/14 2215 10/11/14 0616  BP: 138/80 144/80 148/76 152/78  Pulse: 104 89 76 76  Temp: 98.6 F (37 C) 98.4 F (36.9 C) 97.6 F (36.4 C) 98.6 F (37 C)  TempSrc: Oral Oral Oral Oral  Resp: 20 20 20 20   Height:      Weight:      SpO2: 100% 98% 100% 100%    Exam:  General:  Pt is alert, not in acute distress  HEENT: No icterus, No thrush  Cardiovascular: regular rate and rhythm, S1/S2 No murmur  Respiratory: clear to auscultation bilaterally   Abdomen: Soft, +Bowel sounds, moderate diffuse tenderness, non distended, no guarding  MSK: No LE edema, cyanosis or clubbing  Data Reviewed: Basic Metabolic Panel:  Recent Labs Lab 10/09/14 2150 10/10/14 0053 10/10/14 0525 10/10/14 0911 10/10/14 1405  NA 148* 149* 149* 145 140  K 4.2 3.8 4.1 3.7 3.6  CL 118* 118* 115* 113* 110  CO2 22 24 25 23 24   GLUCOSE 222* 160* 129* 227* 253*  BUN 15 16 16 16 12   CREATININE 0.67 0.59 0.53 0.58 0.58  CALCIUM 9.5 9.6 9.4 9.1 8.8   Liver Function Tests:  Recent Labs Lab 10/09/14 1500  AST 35  ALT 38*  ALKPHOS 84  BILITOT 1.0  PROT 7.1   ALBUMIN 3.9    Recent Labs Lab 10/09/14 1500  LIPASE 16   No results for input(s): AMMONIA in the last 168 hours. CBC:  Recent Labs Lab 10/09/14 1120 10/09/14 2150 10/10/14 0525 10/11/14 0524  WBC 15.1* 20.1* 15.5* 10.6*  HGB 13.5 12.3 12.0 11.3*  HCT 42.0 39.5 38.1 36.3  MCV 73.3* 74.5* 74.0* 73.6*  PLT 211 176 158 145*   Cardiac Enzymes: No results for input(s): CKTOTAL, CKMB, CKMBINDEX, TROPONINI in the last 168 hours. BNP (last 3 results) No results for input(s): BNP in the last 8760 hours.  ProBNP (last 3 results) No results for input(s): PROBNP in the last 8760 hours.  CBG:  Recent Labs Lab 10/10/14 0718 10/10/14 1136 10/10/14 1711 10/10/14 2209 10/11/14 0744  GLUCAP 144* 256* 156* 251* 269*    No results found for this or any previous visit (from the past 240 hour(s)).   Studies: No results found.  Scheduled Meds:  Scheduled Meds: . antiseptic oral rinse  7 mL Mouth Rinse q12n4p  . bisacodyl  10 mg Rectal Daily  . chlorhexidine  15 mL Mouth Rinse BID  . divalproex  500 mg Oral BID  . enoxaparin (LOVENOX) injection  40 mg Subcutaneous Q24H  . gabapentin  300 mg Oral TID  . insulin aspart protamine- aspart  8 Units Subcutaneous BID WC  . metoCLOPramide (REGLAN) injection  10 mg Intravenous 4 times per day  . metoCLOPramide  10 mg Oral TID AC & HS  . risperiDONE  2 mg Oral QHS   Continuous Infusions: . sodium chloride 75 mL/hr at 10/10/14 0544    Time spent on care of this patient: 35 minutes [place], MD 10/11/2014, 9:08 AM  LOS: 2 days   [facility] Office  [phone] Pager - Text Page per [email or web address]  If 7PM-7AM, please contact night-coverage [email or web address]

## 2014-10-11 NOTE — Clinical Documentation Improvement (Signed)
Query #[id number] 44 year old black female admitted with DKA.  Also has a history of cataracts and gastroparesis per H&P.  Home medications per report included Reglan if she had it available.  If applicable, please clarify and document any additional Diabetic Associated Conditions in the progress notes and discharge summary:   - Cataracts and Gastroparesis likely 2/2 DM Type 1  - Cataracts and Gastroparesis are not associated with the patient's DM1  - Other condition  - Unable to clinically determine  Query #2 "Bipolar I" and "Bipolar II" are documented in the current H&P.  Please clarify if either diagnosis is applicable to this admission, including any associated medications and/or treatment during this admission.   Thank You, Erling Conte ,RN Clinical Documentation Specialist:  [place] [facility]

## 2014-10-11 NOTE — Progress Notes (Signed)
Noted plus two edema to right arm, probably due to IV in that arm. Stopped Iv fluids will order new IV

## 2014-10-11 NOTE — Progress Notes (Signed)
Inpatient Diabetes Program Recommendations  AACE/ADA: New Consensus Statement on Inpatient Glycemic Control (2013)  Target Ranges:  Prepandial:   less than 140 mg/dL      Peak postprandial:   less than 180 mg/dL (1-2 hours)      Critically ill patients:  140 - 180 mg/dL   Results for Mary [name], Mary [name] (MRN [id number]) as of 10/11/2014 17:02  Ref. Range 10/10/2014 11:36 10/10/2014 17:11 10/10/2014 22:09 10/11/2014 07:44 10/11/2014 11:45  Glucose-Capillary Latest Ref Range: 70-99 mg/dL 256 (H) 156 (H) 251 (H) 269 (H) 242 (H)   Needs Lantus - Prior to admission pt was on 35 units of Lantus, along with 70/30 8 units bid. Received insulin pens from [facility] and states pens were "broken" and could not be used. Will need more than 70/30 8 units bid to achieve reasonable glycemic control.  Recommend: Lantus 25 units QHS  Will follow-up in am. Thank you. Lorenda Peck, RD, LDN, CDE Inpatient Diabetes Coordinator [phone]

## 2014-10-11 NOTE — Progress Notes (Signed)
CARE MANAGEMENT NOTE 10/11/2014  Patient:  Mary [name], Mary [name]   Account Number:  [id number]  Date Initiated:  10/11/2014  Documentation initiated by:  Edwyna Shell  Subjective/Objective Assessment:   44 yo female admitted with DKA from home     Action/Plan:   discharge planning   Anticipated DC Date:  10/11/2014   Anticipated DC Plan:  [place]  CM consult      Choice offered to / List presented to:             Status of service:  Completed, signed off Medicare Important Message given?   (If response is "NO", the following Medicare IM given date fields will be blank) Date Medicare IM given:   Medicare IM given by:   Date Additional Medicare IM given:   Additional Medicare IM given by:    Discharge Disposition:    Per UR Regulation:    If discussed at Long Length of Stay Meetings, dates discussed:    Comments:  10/10/14 Leanne Chang RN BSN CM [phone] Patient stated she is established at the [facility] and she uses the [facility] for her medications. She stated that her lantus pens are from the [facility] but they are "broken". Patient stated that she has not contacted the clinic or made them aware of the issue with her lantus pens. She also stated that they have encouraged her to bring in her financial paper work but she has not done so at this time. This CM scheduled an appointment at the [facility] and encouraged her to discuss her medications needs including her lantus pens not working, also encouraged her to fill oout financial assistance application for greater assist.

## 2014-10-12 DIAGNOSIS — E860 Dehydration: Secondary | ICD-10-CM | POA: Insufficient documentation

## 2014-10-12 LAB — GLUCOSE, CAPILLARY
Glucose-Capillary: 282 mg/dL — ABNORMAL HIGH (ref 70–99)
Glucose-Capillary: 385 mg/dL — ABNORMAL HIGH (ref 70–99)

## 2014-10-12 MED ORDER — GABAPENTIN 300 MG PO CAPS: 300.0000 mg | ORAL_CAPSULE | Freq: Three times a day (TID) | ORAL | Status: DC

## 2014-10-12 MED ORDER — TRAMADOL HCL 50 MG PO TABS: 50.0000 mg | ORAL_TABLET | Freq: Four times a day (QID) | ORAL | Status: DC | PRN

## 2014-10-12 MED ORDER — INSULIN ASPART PROT & ASPART (70-30 MIX) 100 UNIT/ML ~~LOC~~ SUSP: 12.0000 [IU] | Freq: Two times a day (BID) | SUBCUTANEOUS | Status: DC

## 2014-10-12 MED ORDER — PROMETHAZINE HCL 25 MG RE SUPP: 25.0000 mg | Freq: Four times a day (QID) | RECTAL | Status: DC | PRN

## 2014-10-12 MED ORDER — ONDANSETRON 4 MG PO TBDP
4.0000 mg | ORAL_TABLET | ORAL | Status: DC | PRN
  Administered 2014-10-12: 4 mg via ORAL
  Filled 2014-10-12: qty 1

## 2014-10-12 MED ORDER — METOCLOPRAMIDE HCL 10 MG PO TABS: 10.0000 mg | ORAL_TABLET | Freq: Three times a day (TID) | ORAL | Status: DC

## 2014-10-12 NOTE — Discharge Summary (Signed)
Physician Discharge Summary  Mary [name]:[id number] DOB: [birth year] DOA: 10/09/2014  PCP: [name], NP  Admit date: 10/09/2014 Discharge date: 10/12/2014  Time spent: 50 minutes  Recommendations for Outpatient Follow-up:  1. Transition to Lantus (or other long acting insulin) and Novolog when able   Discharge Condition: stable Diet recommendation: low sodium, heart healthy, carb modified  Discharge Diagnoses:  Principal Problem:   DKA (diabetic ketoacidoses) Active Problems:   DM (diabetes mellitus), type 1, uncontrolled   Gastroparesis due to DM   Seizure disorder   Tobacco abuse   Nausea with vomiting   History of present illness:  Mary [name] is a 44 y.o. female with IDDM, bipolar disorder, gastroparesis, seizure disorder and multiple admissions for DKA and medication noncompliance admitted with acute abdominal pain, vomiting and DKA. Please see HPI from 4/24 fore more details.  Hospital Course:  Principal Problem:  DKA (diabetic ketoacidoses) with poorly controlled DM 1 - DKA resolved with insulin infusion -she states that she has a Lantus pen at home but it broke soon after she got it about a month ago and she has been managing her sugars with novolin 70/30 about 8 U BID - received Lantus when transitioned off of insulin infusion but now transition back to 70/30 today which is what she takes at home- she is unable to afford medications - -Last A1c in 2/16 was 11.5-repeat A1c is 11.3 - have increased 70/30 to 12 U BID- she has been advised that this is not the ideal treatment for DM especially in the setting of gastroparesis where she may miss her meals due to vomiting- she would be better off on Lantus (or other long acting insulin)  and Novolog whch she would be able to hold if she was vomiting - she [facility] where her Insulin pens will be checked to see if working appropriately - she can be transitioned to  Lantus and Novolog at that time- would benefit from endocrine referral - she has been advised to keep a log of her sugars and take it to her PCP when she goes - I have discussed in detail the long term complications of uncontrolled DM  Active Problems: -Vomiting and abdominal pain-most likely gastroparesis secondary to DM -She does not have Reglan at home because she states that she was not able to pay the $4 to fill the prescription at the [facility] -Transitioned to oral Reglan today TID AC and HS- give 30 min prior to meals - I have had a long discussion with her about the need for taking Reglan appropriately  - she is asking for narcotics to control her abdominal pain which only occurs after she vomits- I have explained how narcotics can exacerbate bowel slowing and lead to further vomiting and she understands that using narcotics for her pain would not be ideal- she is asking for Ultram if the pain is too severe to tolerate and also feels she needs it for uncontrolled neuropathy (she has used it in the past) - I have agreed to give her a prescription and advised to use it sparingly   Leukocytosis -WBC count improved from 20 to 10-not on antibiotics as no clear source of infection found - suspect it was a stress response   Seizure disorder - Takes Depakote which she has at home   Tobacco abuse -Smokes 2-3 cigarettes a day-advised to discontinue  Bipolar disorder -Continue Risperdal and  Depakote  Diabetic neuropathy -Continue Neurontin- she does not have it at home as she never went back to the clinic to pick it up- I am giving her a new prescription  Procedures:  none  Consultations:  none  Discharge Exam: Filed Weights   10/09/14 1034 10/09/14 1756  Weight: 54.432 kg (120 lb) 60 kg (132 lb 4.4 oz)   Filed Vitals:   10/12/14 0900  BP: 146/96  Pulse: 94  Temp: 98.6 F (37 C)  Resp: 20    General: AAO x 3, no distress Cardiovascular: RRR, no  murmurs  Respiratory: clear to auscultation bilaterally GI: soft, non-tender, non-distended, bowel sound positive  Discharge Instructions You were cared for by a hospitalist during your hospital stay. If you have any questions about your discharge medications or the care you received while you were in the hospital after you are discharged, you can call the unit and asked to speak with the hospitalist on call if the hospitalist that took care of you is not available. Once you are discharged, your primary care physician will handle any further medical issues. Please note that NO REFILLS for any discharge medications will be authorized once you are discharged, as it is imperative that you return to your primary care physician (or establish a relationship with a primary care physician if you do not have one) for your aftercare needs so that they can reassess your need for medications and monitor your lab values.     Medication List    STOP taking these medications        aspirin EC 81 MG tablet     insulin NPH-regular Human (70-30) 100 UNIT/ML injection  Commonly known as:  NOVOLIN 70/30      TAKE these medications        divalproex 500 MG DR tablet  Commonly known as:  DEPAKOTE  Take 1 tablet (500 mg total) by mouth 2 times daily at 12 noon and 4 pm.     gabapentin 300 MG capsule  Commonly known as:  NEURONTIN  Take 1 capsule (300 mg total) by mouth 3 (three) times daily.     insulin aspart protamine- aspart (70-30) 100 UNIT/ML injection  Commonly known as:  NOVOLOG MIX 70/30  Inject 0.12 mLs (12 Units total) into the skin 2 (two) times daily with a meal.     medroxyPROGESTERone 150 MG/ML injection  Commonly known as:  DEPO-PROVERA  Inject 150 mg into the muscle every 3 (three) months.     metoCLOPramide 10 MG tablet  Commonly known as:  REGLAN  Take 1 tablet (10 mg total) by mouth 4 (four) times daily -  before meals and at bedtime.     promethazine 25 MG suppository  Commonly  known as:  PHENERGAN  Place 1 suppository (25 mg total) rectally every 6 (six) hours as needed for nausea or vomiting.     risperiDONE 2 MG tablet  Commonly known as:  RISPERDAL  Take 2 mg by mouth at bedtime.     traMADol 50 MG tablet  Commonly known as:  ULTRAM  Take 1-2 tablets (50-100 mg total) by mouth every 6 (six) hours as needed for moderate pain.      ASK your doctor about these medications        Insulin Glargine 100 UNIT/ML Solostar Pen  Commonly known as:  LANTUS SOLOSTAR  Inject 35 Units into the skin daily.       Allergies  Allergen Reactions  . Penicillins  Rash   Follow-up Information    Follow up with [facility] On 10/24/2014.   Why:  appointment at 3:30pm   Contact information:   [address] [place] [address] [phone]       The results of significant diagnostics from this hospitalization (including imaging, microbiology, ancillary and laboratory) are listed below for reference.    Significant Diagnostic Studies: No results found.  Microbiology: No results found for this or any previous visit (from the past 240 hour(s)).   Labs: Basic Metabolic Panel:  Recent Labs Lab 10/09/14 2150 10/10/14 0053 10/10/14 0525 10/10/14 0911 10/10/14 1405  NA 148* 149* 149* 145 140  K 4.2 3.8 4.1 3.7 3.6  CL 118* 118* 115* 113* 110  CO2 22 24 25 23 24   GLUCOSE 222* 160* 129* 227* 253*  BUN 15 16 16 16 12   CREATININE 0.67 0.59 0.53 0.58 0.58  CALCIUM 9.5 9.6 9.4 9.1 8.8   Liver Function Tests:  Recent Labs Lab 10/09/14 1500  AST 35  ALT 38*  ALKPHOS 84  BILITOT 1.0  PROT 7.1  ALBUMIN 3.9    Recent Labs Lab 10/09/14 1500  LIPASE 16   No results for input(s): AMMONIA in the last 168 hours. CBC:  Recent Labs Lab 10/09/14 1120 10/09/14 2150 10/10/14 0525 10/11/14 0524  WBC 15.1* 20.1* 15.5* 10.6*  HGB 13.5 12.3 12.0 11.3*  HCT 42.0 39.5 38.1 36.3  MCV 73.3* 74.5* 74.0* 73.6*   PLT 211 176 158 145*   Cardiac Enzymes: No results for input(s): CKTOTAL, CKMB, CKMBINDEX, TROPONINI in the last 168 hours. BNP: BNP (last 3 results) No results for input(s): BNP in the last 8760 hours.  ProBNP (last 3 results) No results for input(s): PROBNP in the last 8760 hours.  CBG:  Recent Labs Lab 10/11/14 0744 10/11/14 1145 10/11/14 1743 10/11/14 2148 10/12/14 0729  GLUCAP 269* 242* 308* 317* 282*       SignedDebbe Odea, MD [facility] 10/12/2014, 10:38 AM

## 2014-10-12 NOTE — Progress Notes (Signed)
Discharge instructions given to patient on medications,and follow up visits,patient verbalized understanding. Prescriptions sent with patient. Vital signs stable. Accompanied by staff to an awaiting vehicle.

## 2014-10-17 ENCOUNTER — Encounter (HOSPITAL_COMMUNITY): Payer: Self-pay

## 2014-10-17 ENCOUNTER — Emergency Department (HOSPITAL_COMMUNITY)
Admission: EM | Admit: 2014-10-17 | Discharge: 2014-10-18 | Disposition: A | Discharge status: Home / Self Care | Payer: Self-pay | Attending: Emergency Medicine | Admitting: Emergency Medicine

## 2014-10-17 DIAGNOSIS — R739 Hyperglycemia, unspecified: Secondary | ICD-10-CM

## 2014-10-17 DIAGNOSIS — Z3202 Encounter for pregnancy test, result negative: Secondary | ICD-10-CM | POA: Insufficient documentation

## 2014-10-17 DIAGNOSIS — E1065 Type 1 diabetes mellitus with hyperglycemia: Secondary | ICD-10-CM | POA: Insufficient documentation

## 2014-10-17 DIAGNOSIS — R1084 Generalized abdominal pain: Secondary | ICD-10-CM

## 2014-10-17 DIAGNOSIS — Z880 Allergy status to penicillin: Secondary | ICD-10-CM | POA: Insufficient documentation

## 2014-10-17 DIAGNOSIS — Z79899 Other long term (current) drug therapy: Secondary | ICD-10-CM | POA: Insufficient documentation

## 2014-10-17 DIAGNOSIS — Z794 Long term (current) use of insulin: Secondary | ICD-10-CM | POA: Insufficient documentation

## 2014-10-17 DIAGNOSIS — E860 Dehydration: Secondary | ICD-10-CM | POA: Insufficient documentation

## 2014-10-17 DIAGNOSIS — H269 Unspecified cataract: Secondary | ICD-10-CM | POA: Insufficient documentation

## 2014-10-17 DIAGNOSIS — I10 Essential (primary) hypertension: Secondary | ICD-10-CM | POA: Insufficient documentation

## 2014-10-17 DIAGNOSIS — Z8739 Personal history of other diseases of the musculoskeletal system and connective tissue: Secondary | ICD-10-CM | POA: Insufficient documentation

## 2014-10-17 DIAGNOSIS — G40909 Epilepsy, unspecified, not intractable, without status epilepticus: Secondary | ICD-10-CM | POA: Insufficient documentation

## 2014-10-17 DIAGNOSIS — R011 Cardiac murmur, unspecified: Secondary | ICD-10-CM | POA: Insufficient documentation

## 2014-10-17 DIAGNOSIS — Z9049 Acquired absence of other specified parts of digestive tract: Secondary | ICD-10-CM | POA: Insufficient documentation

## 2014-10-17 DIAGNOSIS — F329 Major depressive disorder, single episode, unspecified: Secondary | ICD-10-CM | POA: Insufficient documentation

## 2014-10-17 DIAGNOSIS — Z720 Tobacco use: Secondary | ICD-10-CM | POA: Insufficient documentation

## 2014-10-17 DIAGNOSIS — Z862 Personal history of diseases of the blood and blood-forming organs and certain disorders involving the immune mechanism: Secondary | ICD-10-CM | POA: Insufficient documentation

## 2014-10-17 DIAGNOSIS — Z8619 Personal history of other infectious and parasitic diseases: Secondary | ICD-10-CM | POA: Insufficient documentation

## 2014-10-17 DIAGNOSIS — Z87442 Personal history of urinary calculi: Secondary | ICD-10-CM | POA: Insufficient documentation

## 2014-10-17 DIAGNOSIS — K3184 Gastroparesis: Secondary | ICD-10-CM | POA: Insufficient documentation

## 2014-10-17 DIAGNOSIS — E1040 Type 1 diabetes mellitus with diabetic neuropathy, unspecified: Secondary | ICD-10-CM | POA: Insufficient documentation

## 2014-10-17 LAB — COMPREHENSIVE METABOLIC PANEL
ALBUMIN: 4.8 g/dL (ref 3.5–5.0)
ALK PHOS: 89 U/L (ref 38–126)
ALT: 54 U/L (ref 14–54)
AST: 60 U/L — ABNORMAL HIGH (ref 15–41)
Anion gap: 16 — ABNORMAL HIGH (ref 5–15)
BUN: 18 mg/dL (ref 6–20)
CHLORIDE: 99 mmol/L — AB (ref 101–111)
CO2: 21 mmol/L — AB (ref 22–32)
CREATININE: 0.76 mg/dL (ref 0.44–1.00)
Calcium: 10.1 mg/dL (ref 8.9–10.3)
GFR calc Af Amer: >60 mL/min (ref >60)
GFR calc non Af Amer: >60 mL/min (ref >60)
Glucose, Bld: 372 mg/dL — ABNORMAL HIGH (ref 70–99)
Potassium: 5.8 mmol/L — ABNORMAL HIGH (ref 3.5–5.1)
Sodium: 136 mmol/L (ref 135–145)
TOTAL PROTEIN: 8.8 g/dL — AB (ref 6.5–8.1)
Total Bilirubin: 1.8 mg/dL — ABNORMAL HIGH (ref 0.3–1.2)

## 2014-10-17 LAB — CBC WITH DIFFERENTIAL/PLATELET
Basophils Absolute: 0 10*3/uL (ref 0.0–0.1)
Basophils Relative: 0 % (ref 0–1)
EOS ABS: 0 10*3/uL (ref 0.0–0.7)
EOS PCT: 0 % (ref 0–5)
HCT: 44.6 % (ref 36.0–46.0)
Hemoglobin: 14.1 g/dL (ref 12.0–15.0)
Lymphocytes Relative: 31 % (ref 12–46)
Lymphs Abs: 4.5 10*3/uL — ABNORMAL HIGH (ref 0.7–4.0)
MCH: 23.3 pg — ABNORMAL LOW (ref 26.0–34.0)
MCHC: 31.6 g/dL (ref 30.0–36.0)
MCV: 73.8 fL — ABNORMAL LOW (ref 78.0–100.0)
MONOS PCT: 7 % (ref 3–12)
Monocytes Absolute: 1 10*3/uL (ref 0.1–1.0)
NEUTROS ABS: 8.9 10*3/uL — AB (ref 1.7–7.7)
Neutrophils Relative %: 62 % (ref 43–77)
Platelets: 211 10*3/uL (ref 150–400)
RBC: 6.04 MIL/uL — AB (ref 3.87–5.11)
RDW: 15.1 % (ref 11.5–15.5)
WBC: 14.4 10*3/uL — ABNORMAL HIGH (ref 4.0–10.5)

## 2014-10-17 LAB — BLOOD GAS, VENOUS
ACID-BASE DEFICIT: 4.1 mmol/L — AB (ref 0.0–2.0)
Bicarbonate: 20.5 mEq/L (ref 20.0–24.0)
O2 SAT: 64.5 %
PATIENT TEMPERATURE: 98.6
PCO2 VEN: 37.6 mmHg — AB (ref 45.0–50.0)
PH VEN: 7.354 — AB (ref 7.250–7.300)
PO2 VEN: 37.3 mmHg (ref 30.0–45.0)
TCO2: 18.4 mmol/L (ref 0–100)

## 2014-10-17 LAB — LIPASE, BLOOD: Lipase: 17 U/L — ABNORMAL LOW (ref 22–51)

## 2014-10-17 LAB — I-STAT BETA HCG BLOOD, ED (MC, WL, AP ONLY): I-stat hCG, quantitative: 7.7 m[IU]/mL — ABNORMAL HIGH (ref <5)

## 2014-10-17 LAB — CBG MONITORING, ED: Glucose-Capillary: 372 mg/dL — ABNORMAL HIGH (ref 70–99)

## 2014-10-17 MED ORDER — METOCLOPRAMIDE HCL 5 MG/ML IJ SOLN
10.0000 mg | Freq: Once | INTRAMUSCULAR | Status: AC
  Administered 2014-10-17: 10 mg via INTRAVENOUS
  Filled 2014-10-17: qty 2

## 2014-10-17 MED ORDER — SODIUM CHLORIDE 0.9 % IV BOLUS (SEPSIS)
1000.0000 mL | Freq: Once | INTRAVENOUS | Status: AC
  Administered 2014-10-17: 1000 mL via INTRAVENOUS

## 2014-10-17 MED ORDER — ONDANSETRON HCL 4 MG/2ML IJ SOLN
4.0000 mg | Freq: Once | INTRAMUSCULAR | Status: AC
  Administered 2014-10-17: 4 mg via INTRAVENOUS
  Filled 2014-10-17: qty 2

## 2014-10-17 MED ORDER — DIPHENHYDRAMINE HCL 50 MG/ML IJ SOLN
25.0000 mg | Freq: Once | INTRAMUSCULAR | Status: AC
  Administered 2014-10-17: 25 mg via INTRAVENOUS
  Filled 2014-10-17: qty 1

## 2014-10-17 MED ORDER — SODIUM CHLORIDE 0.9 % IV SOLN
1000.0000 mL | INTRAVENOUS | Status: DC
  Administered 2014-10-17: 1000 mL via INTRAVENOUS

## 2014-10-17 MED ORDER — HYDROMORPHONE HCL 1 MG/ML IJ SOLN
1.0000 mg | Freq: Once | INTRAMUSCULAR | Status: AC
  Administered 2014-10-17: 1 mg via INTRAVENOUS
  Filled 2014-10-17: qty 1

## 2014-10-17 NOTE — ED Notes (Signed)
Nurse currently starting IV 

## 2014-10-17 NOTE — ED Notes (Signed)
Pt could not stand to perform orthostatic vitals at this time.

## 2014-10-17 NOTE — ED Notes (Addendum)
Patient arrives via EMS with complaints of abdominal pain, N/V x 2 days.  History of severe gastritis.  EMS administered Zofran 4mg  IM en route.  CBG:353.

## 2014-10-18 LAB — CBG MONITORING, ED: Glucose-Capillary: 307 mg/dL — ABNORMAL HIGH (ref 70–99)

## 2014-10-18 LAB — URINE MICROSCOPIC-ADD ON

## 2014-10-18 LAB — URINALYSIS, ROUTINE W REFLEX MICROSCOPIC
Bilirubin Urine: NEGATIVE
Glucose, UA: >1000 mg/dL — AB
HGB URINE DIPSTICK: NEGATIVE
Ketones, ur: >80 mg/dL — AB
Leukocytes, UA: NEGATIVE
NITRITE: NEGATIVE
PH: 5.5 (ref 5.0–8.0)
Protein, ur: NEGATIVE mg/dL
SPECIFIC GRAVITY, URINE: 1.039 — AB (ref 1.005–1.030)
Urobilinogen, UA: 0.2 mg/dL (ref 0.0–1.0)

## 2014-10-18 LAB — POTASSIUM: Potassium: 4.6 mmol/L (ref 3.5–5.1)

## 2014-10-18 LAB — HCG, QUANTITATIVE, PREGNANCY

## 2014-10-18 MED ORDER — METOCLOPRAMIDE HCL 10 MG PO TABS: 10.0000 mg | ORAL_TABLET | Freq: Four times a day (QID) | ORAL | Status: DC | PRN

## 2014-10-18 MED ORDER — SODIUM CHLORIDE 0.9 % IV SOLN
80.0000 mg | Freq: Once | INTRAVENOUS | Status: AC
  Administered 2014-10-18: 80 mg via INTRAVENOUS
  Filled 2014-10-18: qty 80

## 2014-10-18 MED ORDER — SODIUM CHLORIDE 0.9 % IV BOLUS (SEPSIS)
1000.0000 mL | Freq: Once | INTRAVENOUS | Status: AC
  Administered 2014-10-18: 1000 mL via INTRAVENOUS

## 2014-10-18 NOTE — Discharge Instructions (Signed)
1. Medications: reglan, usual home medications 2. Treatment: rest, drink plenty of fluids, advance diet slowly 3. Follow Up: Please followup with your primary doctor in 2 days for discussion of your diagnoses and further evaluation after today's visit; if you do not have a primary care doctor use the resource guide provided to find one; Please return to the ER for persistent vomiting, high fevers or worsening symptoms     Gastroparesis  Gastroparesis is also called slowed stomach emptying (delayed gastric emptying). It is a condition in which the stomach takes too long to empty its contents. It often happens in people with diabetes.  CAUSES  Gastroparesis happens when nerves to the stomach are damaged or stop working. When the nerves are damaged, the muscles of the stomach and intestines do not work normally. The movement of food is slowed or stopped. High blood glucose (sugar) causes changes in nerves and can damage the blood vessels that carry oxygen and nutrients to the nerves. RISK FACTORS  Diabetes.  Post-viral syndromes.  Eating disorders (anorexia, bulimia).  Surgery on the stomach or vagus nerve.  Gastroesophageal reflux disease (rarely).  Smooth muscle disorders (amyloidosis, scleroderma).  Metabolic disorders, including hypothyroidism.  Parkinson disease. SYMPTOMS   Heartburn.  Feeling sick to your stomach (nausea).  Vomiting of undigested food.  An early feeling of fullness when eating.  Weight loss.  Abdominal bloating.  Erratic blood glucose levels.  Lack of appetite.  Gastroesophageal reflux.  Spasms of the stomach wall. Complications can include:  Bacterial overgrowth in stomach. Food stays in the stomach and can ferment and cause bacteria to grow.  Weight loss due to difficulty digesting and absorbing nutrients.  Vomiting.  Obstruction in the stomach. Undigested food can harden and cause nausea and vomiting.  Blood glucose fluctuations caused  by inconsistent food absorption. DIAGNOSIS  The diagnosis of gastroparesis is confirmed through one or more of the following tests:  Barium X-rays and scans. These tests look at how long it takes for food to move through the stomach.  Gastric manometry. This test measures electrical and muscular activity in the stomach. A thin tube is passed down the throat into the stomach. The tube contains a wire that takes measurements of the stomach's electrical and muscular activity as it digests liquids and solid food.  Endoscopy. This procedure is done with a long, thin tube called an endoscope. It is passed through the mouth and gently down the esophagus into the stomach. This tube helps the caregiver look at the lining of the stomach to check for any abnormalities.  Ultrasonography. This can rule out gallbladder disease or pancreatitis. This test will outline and define the shape of the gallbladder and pancreas. TREATMENT   Treatments may include:  Exercise.  Medicines to control nausea and vomiting.  Medicines to stimulate stomach muscles.  Changes in what and when you eat.  Having smaller meals more often.  Eating low-fiber forms of high-fiber foods, such as eating cooked vegetables instead of raw vegetables.  Eating low-fat foods.  Consuming liquids, which are easier to digest.  In severe cases, feeding tubes and intravenous (IV) feeding may be needed. It is important to note that in most cases, treatment does not cure gastroparesis. It is usually a lasting (chronic) condition. Treatment helps you manage the underlying condition so that you can be as healthy and comfortable as possible. Other treatments  A gastric neurostimulator has been developed to assist people with gastroparesis. The battery-operated device is surgically implanted. It emits mild  electrical pulses to help improve stomach emptying and to control nausea and vomiting.  The use of botulinum toxin has been shown to  improve stomach emptying by decreasing the prolonged contractions of the muscle between the stomach and the small intestine (pyloric sphincter). The benefits are temporary. SEEK MEDICAL CARE IF:   You have diabetes and you are having problems keeping your blood glucose in goal range.  You are having nausea, vomiting, bloating, or early feelings of fullness with eating.  Your symptoms do not change with a change in diet. Document Released: 06/03/2005 Document Revised: 09/28/2012 Document Reviewed: 11/10/2008 [facility] Patient Information 2015 [place], [place]. This information is not intended to replace advice given to you by your health care provider. Make sure you discuss any questions you have with your health care provider.

## 2014-10-18 NOTE — ED Notes (Signed)
Pt stated that she is not able to stand

## 2014-10-18 NOTE — ED Provider Notes (Signed)
CSN: [id number]     Arrival date & time 10/17/14  1957 History   First MD Initiated Contact with Patient 10/17/14 2212     Chief Complaint  Patient presents with  . Emesis     (Consider location/radiation/quality/duration/timing/severity/associated sxs/prior Treatment) Patient is a 44 y.o. female presenting with vomiting. The history is provided by the patient and medical records. No language interpreter was used.  Emesis Associated symptoms: abdominal pain   Associated symptoms: no diarrhea      Mary [name] is a 44 y.o. female  with a hx of IDDM, seizure disorder, gastritis, arthritis, DKA often due to medication noncompliance, hypertension, gastroparesis presents to the Emergency Department complaining of gradual, persistent, progressively worsening epigastric abdominal pain with associated nausea and vomiting onset 2 days ago. She reports multiple episodes of nonbloody and nonbilious emesis which is largely ingested fluids. She denies diarrhea. She reports her abdominal pain is cramping in nature. She reports a history of cholecystectomy but no other major abdominal surgeries. She denies fever, chills, headache, neck pain, chest pain, shortness of breath, constipation, abdominal distention, weakness, dizziness, syncope, dysuria.    Past Medical History  Diagnosis Date  . Kidney stone   . Seizure disorder     started with pregnancy of first son  . Gastritis   . Bipolar 2 disorder   . Post traumatic stress disorder     "flipping out" after people close to her died  . Heart murmur   . Arthritis     r ankle-S/P surgery (2007)  . Anemia     childhood  . Depression     childhood  . Hepatitis C     biopsy in 2010-no rx-was supposed to see a hepatologist in [place].  With cirrhosis  . Neuropathy     in legs and feet and hands  . Cataracts, bilateral   . Scoliosis   . Diabetes mellitus     diagnosed in 1996-always been on insulin  . DKA (diabetic ketoacidoses)      Recurrent admissions for DKA, medication non-complaince, poor social situation  . Diabetic neuropathy, type I diabetes mellitus     numbness bilaterally feet  . HTN (hypertension) 10/12/2013   Past Surgical History  Procedure Laterality Date  . Ankle surgery  2007  . Endometrial biopsy  06/22/2012  . Cholecystectomy  04/07/2013  . Cholecystectomy N/A 04/07/2013    Procedure: LAPAROSCOPIC CHOLECYSTECTOMY WITH INTRAOPERATIVE CHOLANGIOGRAM;  Surgeon: Adin Hector, MD;  Location: [facility] ORS;  Service: General;  Laterality: N/A;  . Cesarean section      3 times   Family History  Problem Relation Age of Onset  . Diabetes Mother     currently 75  . Fibromyalgia Mother   . Cirrhosis Father     died in 5  . Diabetes Maternal Grandmother   . Diabetes Maternal Aunt    History  Substance Use Topics  . Smoking status: Current Every Day Smoker -- 0.10 packs/day for 25 years    Types: Cigarettes  . Smokeless tobacco: Never Used  . Alcohol Use: No     Comment: [employer] hasn't been drinking   OB History    Gravida Para Term Preterm AB TAB SAB Ectopic Multiple Living   4 3 3  0 1 0 1 0 1 2     Review of Systems  Constitutional: Negative for fever, diaphoresis, appetite change, fatigue and unexpected weight change.  HENT: Negative for mouth sores and trouble swallowing.   Respiratory: Negative  for cough, chest tightness, shortness of breath, wheezing and stridor.   Cardiovascular: Negative for chest pain and palpitations.  Gastrointestinal: Positive for nausea, vomiting and abdominal pain. Negative for diarrhea, constipation, blood in stool, abdominal distention and rectal pain.  Genitourinary: Negative for dysuria, urgency, frequency, hematuria, flank pain and difficulty urinating.  Musculoskeletal: Negative for back pain, neck pain and neck stiffness.  Skin: Negative for rash.  Neurological: Negative for weakness.  Hematological: Negative for adenopathy.  Psychiatric/Behavioral:  Negative for confusion.  All other systems reviewed and are negative.     Allergies  Penicillins  Home Medications   Prior to Admission medications   Medication Sig Start Date End Date Taking? Authorizing Provider  divalproex (DEPAKOTE) 500 MG DR tablet Take 1 tablet (500 mg total) by mouth 2 times daily at 12 noon and 4 pm. 08/17/14  Yes Orson Eva, MD  insulin aspart protamine- aspart (NOVOLOG MIX 70/30) (70-30) 100 UNIT/ML injection Inject 0.12 mLs (12 Units total) into the skin 2 (two) times daily with a meal. 10/12/14  Yes Debbe Odea, MD  medroxyPROGESTERone (DEPO-PROVERA) 150 MG/ML injection Inject 150 mg into the muscle every 3 (three) months.   Yes Historical Provider, MD  promethazine (PHENERGAN) 25 MG suppository Place 1 suppository (25 mg total) rectally every 6 (six) hours as needed for nausea or vomiting. 10/12/14  Yes Debbe Odea, MD  risperiDONE (RISPERDAL) 2 MG tablet Take 2 mg by mouth at bedtime.   Yes Historical Provider, MD  gabapentin (NEURONTIN) 300 MG capsule Take 1 capsule (300 mg total) by mouth 3 (three) times daily. Patient not taking: Reported on 10/17/2014 10/12/14   Debbe Odea, MD  metoCLOPramide (REGLAN) 10 MG tablet Take 1 tablet (10 mg total) by mouth every 6 (six) hours as needed for nausea (nausea/headache). 10/18/14   Kris No, PA-C  traMADol (ULTRAM) 50 MG tablet Take 1-2 tablets (50-100 mg total) by mouth every 6 (six) hours as needed for moderate pain. Patient not taking: Reported on 10/17/2014 10/12/14   Debbe Odea, MD   BP 108/56 mmHg  Pulse 102  Temp(Src) 97.8 F (36.6 C) (Oral)  Resp 16  SpO2 98% Physical Exam  Constitutional: She appears well-developed and well-nourished. No distress.  Awake, alert, nontoxic appearance  HENT:  Head: Normocephalic and atraumatic.  Mouth/Throat: Oropharynx is clear and moist. No oropharyngeal exudate.  Eyes: Conjunctivae are normal. No scleral icterus.  Neck: Normal range of motion. Neck supple.   Cardiovascular: Normal rate, regular rhythm and intact distal pulses.   Pulmonary/Chest: Effort normal and breath sounds normal. No respiratory distress. She has no wheezes.  Equal chest expansion  Abdominal: Soft. Bowel sounds are normal. She exhibits no distension and no mass. There is tenderness in the epigastric area. There is no rebound, no guarding and no CVA tenderness.  Epigastric abdominal tenderness without guarding but no rebound or peritoneal signs No CVA tenderness  Musculoskeletal: Normal range of motion. She exhibits no edema.  Neurological: She is alert.  Speech is clear and goal oriented Moves extremities without ataxia  Skin: Skin is warm and dry. She is not diaphoretic.  Psychiatric: She has a normal mood and affect.  Nursing note and vitals reviewed.   ED Course  Procedures (including critical care time) Labs Review Labs Reviewed  CBC WITH DIFFERENTIAL/PLATELET - Abnormal; Notable for the following:    WBC 14.4 (*)    RBC 6.04 (*)    MCV 73.8 (*)    MCH 23.3 (*)    Neutro Abs  8.9 (*)    Lymphs Abs 4.5 (*)    All other components within normal limits  COMPREHENSIVE METABOLIC PANEL - Abnormal; Notable for the following:    Potassium 5.8 (*)    Chloride 99 (*)    CO2 21 (*)    Glucose, Bld 372 (*)    Total Protein 8.8 (*)    AST 60 (*)    Total Bilirubin 1.8 (*)    Anion gap 16 (*)    All other components within normal limits  LIPASE, BLOOD - Abnormal; Notable for the following:    Lipase 17 (*)    All other components within normal limits  BLOOD GAS, VENOUS - Abnormal; Notable for the following:    pH, Ven 7.354 (*)    pCO2, Ven 37.6 (*)    Acid-base deficit 4.1 (*)    All other components within normal limits  URINALYSIS, ROUTINE W REFLEX MICROSCOPIC - Abnormal; Notable for the following:    Specific Gravity, Urine 1.039 (*)    Glucose, UA >1000 (*)    Ketones, ur >80 (*)    All other components within normal limits  CBG MONITORING, ED -  Abnormal; Notable for the following:    Glucose-Capillary 372 (*)    All other components within normal limits  I-STAT BETA HCG BLOOD, ED ([facility], [facility], [facility] ONLY) - Abnormal; Notable for the following:    I-stat hCG, quantitative 7.7 (*)    All other components within normal limits  CBG MONITORING, ED - Abnormal; Notable for the following:    Glucose-Capillary 307 (*)    All other components within normal limits  HCG, QUANTITATIVE, PREGNANCY  POTASSIUM  URINE MICROSCOPIC-ADD ON    Imaging Review No results found.   EKG Interpretation None      MDM   Final diagnoses:  Gastroparesis  Dehydration  Hyperglycemia  Generalized abdominal pain   [place] presents with nausea and vomiting for several days and history of gastroparesis. Patient is an insulin-dependent diabetic. She reports compliance with her medications.  Record review shows the patient has a history of recurrent abdominal pain nausea vomiting diarrhea and recurrent DKA.  11:45PM Agent with mild leukocytosis. Potassium likely hemolyzed CMP as a result of 5.8. We will repeat. Urinalysis with ketones but VBG without acidosis.  Anion gap 16. Patient has been given fluids and I expect that this has decreased.  1:26 AM Patient is feeling much better. She has tolerated by mouth liquids here in the emergency department without emesis. Her pain is under control and her tachycardia has resolved.  Repeat HCG and potassium are both within normal limits.  Patient does have initial anion gap, , but is afebrile, and after fluid resuscitation, improves substantially in appearance.  She has been eating and drinking in the room.  CBG now 307, but pt with PO intake.  Patient has mild leukocytosis, but low suspicion for peritonitis or other acute new pathology given the resolution of symptoms and similarity to previous.Patient discharged in stable condition.  BP 108/56 mmHg  Pulse 102  Temp(Src) 97.8 F (36.6 C) (Oral)  Resp 16  SpO2  98%   Abigail Butts, PA-C 10/19/14 [place], MD 10/20/14 [phone]

## 2014-10-23 ENCOUNTER — Inpatient Hospital Stay (HOSPITAL_COMMUNITY)
Admission: EM | Admit: 2014-10-23 | Discharge: 2014-10-26 | DRG: 637 | Disposition: A | Discharge status: Home Health | Payer: Medicaid Other | Attending: Internal Medicine | Admitting: Internal Medicine

## 2014-10-23 ENCOUNTER — Encounter (HOSPITAL_COMMUNITY): Payer: Self-pay | Admitting: Emergency Medicine

## 2014-10-23 DIAGNOSIS — E1143 Type 2 diabetes mellitus with diabetic autonomic (poly)neuropathy: Secondary | ICD-10-CM | POA: Diagnosis not present

## 2014-10-23 DIAGNOSIS — D509 Iron deficiency anemia, unspecified: Secondary | ICD-10-CM | POA: Diagnosis not present

## 2014-10-23 DIAGNOSIS — R109 Unspecified abdominal pain: Secondary | ICD-10-CM | POA: Diagnosis not present

## 2014-10-23 DIAGNOSIS — Z833 Family history of diabetes mellitus: Secondary | ICD-10-CM | POA: Diagnosis not present

## 2014-10-23 DIAGNOSIS — F3181 Bipolar II disorder: Secondary | ICD-10-CM | POA: Diagnosis present

## 2014-10-23 DIAGNOSIS — E1010 Type 1 diabetes mellitus with ketoacidosis without coma: Principal | ICD-10-CM

## 2014-10-23 DIAGNOSIS — Z681 Body mass index (BMI) 19 or less, adult: Secondary | ICD-10-CM | POA: Diagnosis not present

## 2014-10-23 DIAGNOSIS — E1043 Type 1 diabetes mellitus with diabetic autonomic (poly)neuropathy: Secondary | ICD-10-CM | POA: Diagnosis present

## 2014-10-23 DIAGNOSIS — Z794 Long term (current) use of insulin: Secondary | ICD-10-CM

## 2014-10-23 DIAGNOSIS — F4310 Post-traumatic stress disorder, unspecified: Secondary | ICD-10-CM | POA: Diagnosis present

## 2014-10-23 DIAGNOSIS — Z9114 Patient's other noncompliance with medication regimen: Secondary | ICD-10-CM | POA: Diagnosis present

## 2014-10-23 DIAGNOSIS — Z87442 Personal history of urinary calculi: Secondary | ICD-10-CM

## 2014-10-23 DIAGNOSIS — E43 Unspecified severe protein-calorie malnutrition: Secondary | ICD-10-CM | POA: Diagnosis present

## 2014-10-23 DIAGNOSIS — F319 Bipolar disorder, unspecified: Secondary | ICD-10-CM | POA: Diagnosis present

## 2014-10-23 DIAGNOSIS — R112 Nausea with vomiting, unspecified: Secondary | ICD-10-CM | POA: Diagnosis present

## 2014-10-23 DIAGNOSIS — G40909 Epilepsy, unspecified, not intractable, without status epilepticus: Secondary | ICD-10-CM | POA: Diagnosis present

## 2014-10-23 DIAGNOSIS — Z9119 Patient's noncompliance with other medical treatment and regimen: Secondary | ICD-10-CM | POA: Diagnosis present

## 2014-10-23 DIAGNOSIS — B182 Chronic viral hepatitis C: Secondary | ICD-10-CM | POA: Diagnosis present

## 2014-10-23 DIAGNOSIS — E1110 Type 2 diabetes mellitus with ketoacidosis without coma: Secondary | ICD-10-CM | POA: Diagnosis present

## 2014-10-23 DIAGNOSIS — F17210 Nicotine dependence, cigarettes, uncomplicated: Secondary | ICD-10-CM | POA: Diagnosis present

## 2014-10-23 DIAGNOSIS — IMO0002 Reserved for concepts with insufficient information to code with codable children: Secondary | ICD-10-CM | POA: Diagnosis present

## 2014-10-23 DIAGNOSIS — E0810 Diabetes mellitus due to underlying condition with ketoacidosis without coma: Secondary | ICD-10-CM | POA: Diagnosis not present

## 2014-10-23 DIAGNOSIS — K3184 Gastroparesis: Secondary | ICD-10-CM | POA: Diagnosis present

## 2014-10-23 DIAGNOSIS — E860 Dehydration: Secondary | ICD-10-CM | POA: Diagnosis present

## 2014-10-23 DIAGNOSIS — E876 Hypokalemia: Secondary | ICD-10-CM | POA: Diagnosis present

## 2014-10-23 DIAGNOSIS — K7460 Unspecified cirrhosis of liver: Secondary | ICD-10-CM | POA: Diagnosis present

## 2014-10-23 DIAGNOSIS — I471 Supraventricular tachycardia: Secondary | ICD-10-CM | POA: Diagnosis not present

## 2014-10-23 DIAGNOSIS — R079 Chest pain, unspecified: Secondary | ICD-10-CM | POA: Diagnosis not present

## 2014-10-23 DIAGNOSIS — E1065 Type 1 diabetes mellitus with hyperglycemia: Secondary | ICD-10-CM | POA: Diagnosis not present

## 2014-10-23 DIAGNOSIS — Z79899 Other long term (current) drug therapy: Secondary | ICD-10-CM

## 2014-10-23 DIAGNOSIS — G8929 Other chronic pain: Secondary | ICD-10-CM | POA: Diagnosis present

## 2014-10-23 DIAGNOSIS — I10 Essential (primary) hypertension: Secondary | ICD-10-CM | POA: Diagnosis present

## 2014-10-23 DIAGNOSIS — B1920 Unspecified viral hepatitis C without hepatic coma: Secondary | ICD-10-CM | POA: Diagnosis present

## 2014-10-23 LAB — CBC WITH DIFFERENTIAL/PLATELET
Basophils Absolute: 0 10*3/uL (ref 0.0–0.1)
Basophils Relative: 0 % (ref 0–1)
Eosinophils Absolute: 0 10*3/uL (ref 0.0–0.7)
Eosinophils Relative: 0 % (ref 0–5)
HEMATOCRIT: 43.6 % (ref 36.0–46.0)
Hemoglobin: 13.8 g/dL (ref 12.0–15.0)
LYMPHS ABS: 2.2 10*3/uL (ref 0.7–4.0)
Lymphocytes Relative: 18 % (ref 12–46)
MCH: 23.4 pg — ABNORMAL LOW (ref 26.0–34.0)
MCHC: 31.7 g/dL (ref 30.0–36.0)
MCV: 73.8 fL — AB (ref 78.0–100.0)
Monocytes Absolute: 0.4 10*3/uL (ref 0.1–1.0)
Monocytes Relative: 3 % (ref 3–12)
NEUTROS PCT: 79 % — AB (ref 43–77)
Neutro Abs: 9.6 10*3/uL — ABNORMAL HIGH (ref 1.7–7.7)
Platelets: 283 10*3/uL (ref 150–400)
RBC: 5.91 MIL/uL — AB (ref 3.87–5.11)
RDW: 15 % (ref 11.5–15.5)
WBC: 12.2 10*3/uL — ABNORMAL HIGH (ref 4.0–10.5)

## 2014-10-23 LAB — URINALYSIS, ROUTINE W REFLEX MICROSCOPIC
Bilirubin Urine: NEGATIVE
Glucose, UA: >1000 mg/dL — AB
Hgb urine dipstick: NEGATIVE
Leukocytes, UA: NEGATIVE
NITRITE: NEGATIVE
PH: 5.5 (ref 5.0–8.0)
Protein, ur: NEGATIVE mg/dL
SPECIFIC GRAVITY, URINE: 1.033 — AB (ref 1.005–1.030)
Urobilinogen, UA: 0.2 mg/dL (ref 0.0–1.0)

## 2014-10-23 LAB — BASIC METABOLIC PANEL
ANION GAP: 13 (ref 5–15)
Anion gap: 11 (ref 5–15)
Anion gap: 13 (ref 5–15)
BUN: 13 mg/dL (ref 6–20)
BUN: 12 mg/dL (ref 6–20)
BUN: 12 mg/dL (ref 6–20)
CHLORIDE: 119 mmol/L — AB (ref 101–111)
CHLORIDE: 116 mmol/L — AB (ref 101–111)
CO2: 15 mmol/L — ABNORMAL LOW (ref 22–32)
CO2: 15 mmol/L — AB (ref 22–32)
CO2: 20 mmol/L — ABNORMAL LOW (ref 22–32)
CREATININE: 0.58 mg/dL (ref 0.44–1.00)
Calcium: 8.7 mg/dL — ABNORMAL LOW (ref 8.9–10.3)
Calcium: 8.9 mg/dL (ref 8.9–10.3)
Calcium: 9.3 mg/dL (ref 8.9–10.3)
Chloride: 112 mmol/L — ABNORMAL HIGH (ref 101–111)
Creatinine, Ser: 0.74 mg/dL (ref 0.44–1.00)
Creatinine, Ser: 0.63 mg/dL (ref 0.44–1.00)
GFR calc Af Amer: >60 mL/min (ref >60)
GFR calc Af Amer: >60 mL/min (ref >60)
GFR calc Af Amer: >60 mL/min (ref >60)
GFR calc non Af Amer: >60 mL/min (ref >60)
GLUCOSE: 239 mg/dL — AB (ref 70–99)
GLUCOSE: 286 mg/dL — AB (ref 70–99)
Glucose, Bld: 241 mg/dL — ABNORMAL HIGH (ref 70–99)
POTASSIUM: 3.8 mmol/L (ref 3.5–5.1)
Potassium: 4.2 mmol/L (ref 3.5–5.1)
Potassium: 4.2 mmol/L (ref 3.5–5.1)
Sodium: 145 mmol/L (ref 135–145)
Sodium: 144 mmol/L (ref 135–145)
Sodium: 145 mmol/L (ref 135–145)

## 2014-10-23 LAB — COMPREHENSIVE METABOLIC PANEL
ALBUMIN: 4.5 g/dL (ref 3.5–5.0)
ALT: 49 U/L (ref 14–54)
AST: 46 U/L — ABNORMAL HIGH (ref 15–41)
Alkaline Phosphatase: 86 U/L (ref 38–126)
BUN: 14 mg/dL (ref 6–20)
CO2: 16 mmol/L — ABNORMAL LOW (ref 22–32)
CREATININE: 0.77 mg/dL (ref 0.44–1.00)
Calcium: 10 mg/dL (ref 8.9–10.3)
Chloride: 105 mmol/L (ref 101–111)
GFR calc non Af Amer: >60 mL/min (ref >60)
GLUCOSE: 420 mg/dL — AB (ref 70–99)
Potassium: 4.4 mmol/L (ref 3.5–5.1)
Sodium: 144 mmol/L (ref 135–145)
Total Bilirubin: 1.6 mg/dL — ABNORMAL HIGH (ref 0.3–1.2)
Total Protein: 8.2 g/dL — ABNORMAL HIGH (ref 6.5–8.1)

## 2014-10-23 LAB — CBG MONITORING, ED
GLUCOSE-CAPILLARY: 379 mg/dL — AB (ref 70–99)
GLUCOSE-CAPILLARY: 246 mg/dL — AB (ref 70–99)
Glucose-Capillary: 347 mg/dL — ABNORMAL HIGH (ref 70–99)
Glucose-Capillary: 347 mg/dL — ABNORMAL HIGH (ref 70–99)
Glucose-Capillary: 242 mg/dL — ABNORMAL HIGH (ref 70–99)
Glucose-Capillary: 246 mg/dL — ABNORMAL HIGH (ref 70–99)
Glucose-Capillary: 247 mg/dL — ABNORMAL HIGH (ref 70–99)
Glucose-Capillary: 225 mg/dL — ABNORMAL HIGH (ref 70–99)
Glucose-Capillary: 214 mg/dL — ABNORMAL HIGH (ref 70–99)

## 2014-10-23 LAB — URINE MICROSCOPIC-ADD ON

## 2014-10-23 LAB — PREGNANCY, URINE: Preg Test, Ur: NEGATIVE

## 2014-10-23 LAB — RAPID URINE DRUG SCREEN, HOSP PERFORMED
Amphetamines: NOT DETECTED
BENZODIAZEPINES: NOT DETECTED
Barbiturates: NOT DETECTED
Cocaine: NOT DETECTED
Opiates: NOT DETECTED
TETRAHYDROCANNABINOL: POSITIVE — AB

## 2014-10-23 LAB — TROPONIN I

## 2014-10-23 LAB — LIPASE, BLOOD: Lipase: 18 U/L — ABNORMAL LOW (ref 22–51)

## 2014-10-23 MED ORDER — DEXTROSE-NACL 5-0.45 % IV SOLN
INTRAVENOUS | Status: DC
  Administered 2014-10-23: 18:00:00 via INTRAVENOUS

## 2014-10-23 MED ORDER — ONDANSETRON HCL 4 MG/2ML IJ SOLN
4.0000 mg | Freq: Three times a day (TID) | INTRAMUSCULAR | Status: DC | PRN
  Filled 2014-10-23: qty 2

## 2014-10-23 MED ORDER — MEDROXYPROGESTERONE ACETATE 150 MG/ML IM SUSP
150.0000 mg | INTRAMUSCULAR | Status: DC
  Administered 2014-10-26: 150 mg via INTRAMUSCULAR
  Filled 2014-10-23: qty 1

## 2014-10-23 MED ORDER — SODIUM CHLORIDE 0.9 % IV SOLN: INTRAVENOUS | Status: DC

## 2014-10-23 MED ORDER — GABAPENTIN 300 MG PO CAPS
300.0000 mg | ORAL_CAPSULE | Freq: Four times a day (QID) | ORAL | Status: DC | PRN
  Filled 2014-10-23: qty 1

## 2014-10-23 MED ORDER — PROMETHAZINE HCL 25 MG RE SUPP: 25.0000 mg | Freq: Four times a day (QID) | RECTAL | Status: DC | PRN

## 2014-10-23 MED ORDER — SODIUM CHLORIDE 0.9 % IV SOLN
INTRAVENOUS | Status: DC
  Administered 2014-10-23: 16:00:00 via INTRAVENOUS

## 2014-10-23 MED ORDER — DEXTROSE 50 % IV SOLN: 25.0000 mL | INTRAVENOUS | Status: DC | PRN

## 2014-10-23 MED ORDER — SODIUM CHLORIDE 0.9 % IV SOLN
INTRAVENOUS | Status: DC
  Administered 2014-10-23: 3.2 [IU]/h via INTRAVENOUS
  Filled 2014-10-23: qty 2.5

## 2014-10-23 MED ORDER — GABAPENTIN 300 MG PO CAPS
300.0000 mg | ORAL_CAPSULE | Freq: Three times a day (TID) | ORAL | Status: DC
  Administered 2014-10-24 – 2014-10-26 (×8): 300 mg via ORAL
  Filled 2014-10-23 (×9): qty 1

## 2014-10-23 MED ORDER — SODIUM CHLORIDE 0.9 % IV SOLN
INTRAVENOUS | Status: DC
  Filled 2014-10-23: qty 2.5

## 2014-10-23 MED ORDER — INSULIN ASPART 100 UNIT/ML ~~LOC~~ SOLN
0.0000 [IU] | SUBCUTANEOUS | Status: DC
  Administered 2014-10-23 (×3): 3 [IU] via SUBCUTANEOUS
  Filled 2014-10-23 (×3): qty 1

## 2014-10-23 MED ORDER — POTASSIUM CHLORIDE 10 MEQ/100ML IV SOLN
10.0000 meq | INTRAVENOUS | Status: AC
  Filled 2014-10-23: qty 100

## 2014-10-23 MED ORDER — ENOXAPARIN SODIUM 40 MG/0.4ML ~~LOC~~ SOLN
40.0000 mg | Freq: Every day | SUBCUTANEOUS | Status: DC
  Administered 2014-10-24 – 2014-10-25 (×3): 40 mg via SUBCUTANEOUS
  Filled 2014-10-23 (×3): qty 0.4

## 2014-10-23 MED ORDER — DIVALPROEX SODIUM 500 MG PO DR TAB
500.0000 mg | DELAYED_RELEASE_TABLET | Freq: Two times a day (BID) | ORAL | Status: DC
  Administered 2014-10-24 – 2014-10-26 (×5): 500 mg via ORAL
  Filled 2014-10-23 (×6): qty 1

## 2014-10-23 MED ORDER — INSULIN GLARGINE 100 UNIT/ML ~~LOC~~ SOLN
10.0000 [IU] | Freq: Once | SUBCUTANEOUS | Status: AC
  Administered 2014-10-23: 10 [IU] via SUBCUTANEOUS
  Filled 2014-10-23: qty 0.1

## 2014-10-23 MED ORDER — DEXTROSE-NACL 5-0.45 % IV SOLN: INTRAVENOUS | Status: DC

## 2014-10-23 MED ORDER — METOCLOPRAMIDE HCL 5 MG/ML IJ SOLN
10.0000 mg | Freq: Four times a day (QID) | INTRAMUSCULAR | Status: DC | PRN
  Administered 2014-10-23 – 2014-10-24 (×2): 10 mg via INTRAVENOUS
  Filled 2014-10-23 (×2): qty 2

## 2014-10-23 MED ORDER — LORAZEPAM 2 MG/ML IJ SOLN
1.0000 mg | Freq: Once | INTRAMUSCULAR | Status: AC
  Administered 2014-10-23: 1 mg via INTRAVENOUS
  Filled 2014-10-23: qty 1

## 2014-10-23 MED ORDER — SODIUM CHLORIDE 0.9 % IV BOLUS (SEPSIS)
1000.0000 mL | Freq: Once | INTRAVENOUS | Status: AC
  Administered 2014-10-23: 1000 mL via INTRAVENOUS

## 2014-10-23 MED ORDER — INSULIN ASPART 100 UNIT/ML ~~LOC~~ SOLN
0.0000 [IU] | Freq: Every day | SUBCUTANEOUS | Status: DC
  Administered 2014-10-24: 3 [IU] via SUBCUTANEOUS
  Administered 2014-10-25: 2 [IU] via SUBCUTANEOUS

## 2014-10-23 MED ORDER — INSULIN ASPART 100 UNIT/ML ~~LOC~~ SOLN: 0.0000 [IU] | SUBCUTANEOUS | Status: DC

## 2014-10-23 MED ORDER — ACETAMINOPHEN 650 MG RE SUPP: 650.0000 mg | RECTAL | Status: DC | PRN

## 2014-10-23 MED ORDER — DEXTROSE-NACL 5-0.45 % IV SOLN
INTRAVENOUS | Status: DC
  Administered 2014-10-24 – 2014-10-25 (×2): via INTRAVENOUS

## 2014-10-23 MED ORDER — METOCLOPRAMIDE HCL 10 MG PO TABS
10.0000 mg | ORAL_TABLET | Freq: Three times a day (TID) | ORAL | Status: DC
  Administered 2014-10-24 – 2014-10-26 (×10): 10 mg via ORAL
  Filled 2014-10-23 (×11): qty 1

## 2014-10-23 MED ORDER — INSULIN GLARGINE 100 UNIT/ML ~~LOC~~ SOLN
10.0000 [IU] | Freq: Two times a day (BID) | SUBCUTANEOUS | Status: DC
  Administered 2014-10-24: 10 [IU] via SUBCUTANEOUS
  Filled 2014-10-23: qty 0.1

## 2014-10-23 MED ORDER — RISPERIDONE 2 MG PO TABS
2.0000 mg | ORAL_TABLET | Freq: Every day | ORAL | Status: DC
  Administered 2014-10-24 – 2014-10-25 (×3): 2 mg via ORAL
  Filled 2014-10-23 (×3): qty 1

## 2014-10-23 MED ORDER — INSULIN ASPART 100 UNIT/ML ~~LOC~~ SOLN
0.0000 [IU] | Freq: Three times a day (TID) | SUBCUTANEOUS | Status: DC
  Administered 2014-10-24: 9 [IU] via SUBCUTANEOUS
  Administered 2014-10-24 (×2): 7 [IU] via SUBCUTANEOUS
  Administered 2014-10-25: 9 [IU] via SUBCUTANEOUS
  Administered 2014-10-25: 2 [IU] via SUBCUTANEOUS
  Administered 2014-10-25 – 2014-10-26 (×2): 7 [IU] via SUBCUTANEOUS

## 2014-10-23 MED ORDER — GABAPENTIN 300 MG PO CAPS
300.0000 mg | ORAL_CAPSULE | Freq: Four times a day (QID) | ORAL | Status: DC | PRN
  Administered 2014-10-23: 300 mg via ORAL
  Filled 2014-10-23 (×2): qty 1

## 2014-10-23 MED ORDER — FENTANYL CITRATE (PF) 100 MCG/2ML IJ SOLN
100.0000 ug | INTRAMUSCULAR | Status: DC | PRN
  Administered 2014-10-23 – 2014-10-26 (×11): 100 ug via INTRAVENOUS
  Filled 2014-10-23 (×12): qty 2

## 2014-10-23 MED ORDER — INSULIN REGULAR BOLUS VIA INFUSION
0.0000 [IU] | Freq: Three times a day (TID) | INTRAVENOUS | Status: DC
Start: 2014-10-24 — End: 2014-10-24
  Filled 2014-10-23: qty 10

## 2014-10-23 MED ORDER — FENTANYL CITRATE (PF) 100 MCG/2ML IJ SOLN
50.0000 ug | Freq: Once | INTRAMUSCULAR | Status: AC
  Administered 2014-10-23: 50 ug via INTRAVENOUS
  Filled 2014-10-23: qty 2

## 2014-10-23 MED ORDER — SODIUM CHLORIDE 0.9 % IV SOLN
INTRAVENOUS | Status: DC
  Administered 2014-10-24: via INTRAVENOUS

## 2014-10-23 NOTE — ED Notes (Signed)
Patient in too much pain to give urine sample.

## 2014-10-23 NOTE — ED Notes (Signed)
Notified patient again of needed urine sample. Patient states that she doesn't have the urge to go at this time. RN aware

## 2014-10-23 NOTE — ED Notes (Signed)
IV infiltrated. Jen H at bedside attempting [place] IV placement

## 2014-10-23 NOTE — ED Notes (Signed)
Bed: UY29 Expected date: 10/23/14 Expected time: 9:10 AM Means of arrival: Ambulance Comments: Upper quad pain  CBG 355

## 2014-10-23 NOTE — H&P (Addendum)
[facility] History and Physical  Mary [name] VFI:[id number] DOB: 11-12-1970 DOA: 10/23/2014  Referring physician:  Alecia Lemming PCP:  [name], NP   Chief Complaint:  Nausea, vomiting, abdominal pain  HPI:  The patient is a 44 y.o. 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 She was last discharged on 4/27 supposedly on 70/30 12 units BID. Since discharge, she states she took her insulin twice a day as prescribed.  Her CBGs have been typically in the 200-300 range but several > 400, none less than 70.  She missed this morning's dose.  A week after discharge from the hospital, she ran out of her Reglan. She used to Zofran instead but had worsening nausea, vomiting and abdominal pain. She vomited once or twice a day usually yellow green emesis, possibly some red streaks, no diarrhea or constipation. She has severe episodic stabbing epigastric abdominal pain that occasionally radiates to her back and lasts for about 6 hours before resolving on its own. It will go away a little sooner if she soaks in water or takes pain medication. She used up her Ultram in one week.    In ER, mild tachycardia to the low 100s, blood pressure 150/84. Her CO2 16, AG 23, glucose 420.  LFTs stable from prior and lipase negative. UA pending. WBC 12.2. She was given NS 2L, fentanyl, ativan and is being started on insulin gtt. Being admitted for DKA and gastroparesis.    Review of Systems:  General:  Denies fevers, chills, weight loss or gain HEENT:  Denies changes to hearing and vision, rhinorrhea, sinus congestion, sore throat CV:  Denies chest pain and palpitations, lower extremity edema.  PULM:   Denies SOB, wheezing, cough.   GI:  Per history of present illness  GU:  Denies dysuria, frequency, urgency ENDO:  Denies polyuria, polydipsia.   HEME:  Denies hematemesis, blood in stools, melena, abnormal bruising or bleeding.  LYMPH:  Denies lymphadenopathy.   MSK: Chronic arthralgias, myalgias.   DERM:  Denies skin rash or ulcer.   NEURO:  Denies focal numbness, weakness, slurred speech, confusion, facial droop.  PSYCH:  Denies anxiety and depression.    Past Medical History  Diagnosis Date  . Kidney stone   . Seizure disorder     started with pregnancy of first son  . Gastritis   . Bipolar 2 disorder   . Post traumatic stress disorder     "flipping out" after people close to her died  . Heart murmur   . Arthritis     r ankle-S/P surgery (2007)  . Anemia     childhood  . Depression     childhood  . Hepatitis C     biopsy in 2010-no rx-was supposed to see a hepatologist in [place].  With cirrhosis  . Neuropathy     in legs and feet and hands  . Cataracts, bilateral   . Scoliosis   . Diabetes mellitus     diagnosed in 1996-always been on insulin  . DKA (diabetic ketoacidoses)     Recurrent admissions for DKA, medication non-complaince, poor social situation  . Diabetic neuropathy, type I diabetes mellitus     numbness bilaterally feet  . HTN (hypertension) 10/12/2013   Past Surgical  History  Procedure Laterality Date  . Ankle surgery  2007  . Endometrial biopsy  06/22/2012  . Cholecystectomy  04/07/2013  . Cholecystectomy N/A 04/07/2013    Procedure: LAPAROSCOPIC CHOLECYSTECTOMY WITH INTRAOPERATIVE CHOLANGIOGRAM;  Surgeon: Adin Hector, MD;  Location: [facility];  Service: General;  Laterality: N/A;  . Cesarean section      3 times   Social History:  reports that she has been smoking Cigarettes.  She has a 2.5 pack-year smoking history. She has never used smokeless tobacco. She reports that she uses illicit drugs (Marijuana) about once per week. She reports  that she does not drink alcohol. Lives alone  Allergies  Allergen Reactions  . Penicillins Rash    Family History  Problem Relation Age of Onset  . Diabetes Mother     currently 24  . Fibromyalgia Mother   . Cirrhosis Father     died in 56  . Diabetes Maternal Grandmother   . Diabetes Maternal Aunt      Prior to Admission medications   Medication Sig Start Date End Date Taking? Authorizing Provider  insulin aspart protamine- aspart (NOVOLOG MIX 70/30) (70-30) 100 UNIT/ML injection Inject 0.12 mLs (12 Units total) into the skin 2 (two) times daily with a meal. 10/12/14  Yes Debbe Odea, MD  medroxyPROGESTERone (DEPO-PROVERA) 150 MG/ML injection Inject 150 mg into the muscle every 3 (three) months.   Yes Historical Provider, MD  divalproex (DEPAKOTE) 500 MG DR tablet Take 1 tablet (500 mg total) by mouth 2 times daily at 12 noon and 4 pm. 08/17/14   Orson Eva, MD  gabapentin (NEURONTIN) 300 MG capsule Take 1 capsule (300 mg total) by mouth 3 (three) times daily. Patient not taking: Reported on 10/17/2014 10/12/14   Debbe Odea, MD  metoCLOPramide (REGLAN) 10 MG tablet Take 1 tablet (10 mg total) by mouth every 6 (six) hours as needed for nausea (nausea/headache). 10/18/14   Hannah Muthersbaugh, PA-C  promethazine (PHENERGAN) 25 MG suppository Place 1 suppository (25 mg total) rectally every 6 (six) hours as needed for nausea or vomiting. 10/12/14   Debbe Odea, MD  risperiDONE (RISPERDAL) 2 MG tablet Take 2 mg by mouth at bedtime.    Historical Provider, MD  traMADol (ULTRAM) 50 MG tablet Take 1-2 tablets (50-100 mg total) by mouth every 6 (six) hours as needed for moderate pain. Patient not taking: Reported on 10/17/2014 10/12/14   Debbe Odea, MD   Physical Exam: Filed Vitals:   10/23/14 0923 10/23/14 1144  BP: 150/84 119/63  Pulse: 109 115  Temp: 98.7 F (37.1 C) 98.4 F (36.9 C)  TempSrc: Oral Oral  Resp: 18 16  SpO2: 100% 99%     General:  Thin F, NAD, lying on left side  talking in quiet voice and unwilling to roll over on back for exam  Eyes:  PERRL, anicteric, non-injected.  ENT:  Nares clear.  OP clear, non-erythematous without plaques or exudates.  Tachy MM.  Neck:  Supple without TM or JVD.    Lymph:  No cervical, supraclavicular, or submandibular LAD.  Cardiovascular:  RRR, normal S1, S2, without m/r/g.  2+ pulses, warm extremities  Respiratory:  CTA bilaterally without increased WOB.  Abdomen:  Hypoactive BS.  Soft, ND, mild TTP in epigastrium without rebound or guarding  Skin:  No rashes or focal lesions.  Musculoskeletal:  Normal bulk and tone.  No LE edema.  Psychiatric:  A & O x 4.  Flat affect.  Neurologic:  CN 3-12  grossly intact.  5-/5 strength.  Sensation intact.  Labs on Admission:  Basic Metabolic Panel:  Recent Labs Lab 10/17/14 2301 10/18/14 0100 10/23/14 1010  NA 136  --  144  K 5.8* 4.6 4.4  CL 99*  --  105  CO2 21*  --  16*  GLUCOSE 372*  --  420*  BUN 18  --  14  CREATININE 0.76  --  0.77  CALCIUM 10.1  --  10.0   Liver Function Tests:  Recent Labs Lab 10/17/14 2301 10/23/14 1010  AST 60* 46*  ALT 54 49  ALKPHOS 89 86  BILITOT 1.8* 1.6*  PROT 8.8* 8.2*  ALBUMIN 4.8 4.5    Recent Labs Lab 10/17/14 2301 10/23/14 1010  LIPASE 17* 18*   No results for input(s): AMMONIA in the last 168 hours. CBC:  Recent Labs Lab 10/17/14 2301 10/23/14 1010  WBC 14.4* 12.2*  NEUTROABS 8.9* 9.6*  HGB 14.1 13.8  HCT 44.6 43.6  MCV 73.8* 73.8*  PLT 211 283   Cardiac Enzymes:  Recent Labs Lab 10/23/14 1010  TROPONINI <0.03    BNP (last 3 results) No results for input(s): BNP in the last 8760 hours.  ProBNP (last 3 results) No results for input(s): PROBNP in the last 8760 hours.  CBG:  Recent Labs Lab 10/17/14 2257 10/18/14 0145 10/23/14 0957 10/23/14 1120  GLUCAP 372* 307* 347* 379*    Radiological Exams on Admission: No results found.  EKG:  Sinus tachycardia, no acute  changes  Assessment/Plan Active Problems:   DKA (diabetic ketoacidoses)  ---  Uncontrolled DM with complications of gastroparesis, neuropathy, currently in DKA, A1c 11.3 on 4/25 - Insulin gtt -  Clearly the combination of 70/30 twice a day is not working because she frequently misses doses and goes into DKA.  Maybe we should try for lantus twice daily instead so that if she misses a dose, she has enough insulin in her system to get by hopefully without DKA until her next dose is due? - Transition to lantus 8 units BID with SSI once gap closes and bicarb 20 or higher - Replete potassium as needed -  Diabetic educator and nutrition consult -  Needs endocrinologist and may need inpatient consultation since she is not out of the hospital long enough to get to an outpatient appointment  Abdominal pain, nausea and vomiting likely secondary to gastroparesis and DKA -  LFTs and lipase stable -  UA pending -  Troponin neg and ECG stable - Resume reglan - Continue antiemetics and IVF - tylenol as needed -  D/c ultram due to hx of seizure disorder -  Gabapentin as needed -  Minimize narcotics so as to not exacerbate gastroparesis  Sinus tachycardia likely due to dehydration and DKA -  IVF  Diabetic neuropathy, stable, continue gabapentin  Transaminitis, stable, attributed to chronic hepatitis C.   Leukocytosis likely due to vomiting, dehydration and DKA.  -  UA pending -  Defer CXR since no respiratory symptoms  Microcytic anemia. Previous iron studies with elevated iron saturation and iron level. TSH 1.64 wnl. Vit b12 1225, folate 909 in 02/2014.  Suspect thalassemia and chronic disease  Severe protein-calorie malnutrition, from uncontrolled DM - Diabetic diet - Start glucerna   Seizures, stable -  Continue depakote   Bipolar disorder, wonder if she would benefit from additional treatment for depressive component -Continue risperdal  Substance Abuse -UDS  Recurrent  admissions, she has had 14 admissions for complications of diabetes  in the last 12 months.  No insurance and no family resources or assistance. -  SW and CM consults due to frequency of admissions  Diet: diabetic Access: PIV IVF: yes Proph: lovenox  Code Status: full Family Communication: patient alone Disposition Plan: Admit to stepdown  Time spent: 60 min Tabias Swayze, [facility] Pager [phone]  If 7PM-7AM, please contact night-coverage [email or web address] Password [email or web address] 10/23/2014, 12:03 PM

## 2014-10-23 NOTE — Progress Notes (Addendum)
Patient lost IV access and emergency department physician placed in each eye. She was without insulin infusion for several hours and currently the nurse cannot run dextrose and fluids and insulin to the same IV. I placed an order for a PICC line but will not receive line for a while.    -  Dextrose fluids through EJ -  Lantus 10 units once  -  Start aspart via SSI q2h until PICC line can be placed or gap closes.

## 2014-10-23 NOTE — ED Provider Notes (Signed)
Asked by admitting doctor, Dr. Sheran Fava, to help get better IV access. Patient only has an EJ due to limited access and is getting multiple medicines/drips for DKA. Left antecubital IV placed by me without immediate complication. Discussed need for possible central line with patient if this fails or other new problems develop  Angiocath insertion Performed by: Sherwood Gambler T  Consent: Verbal consent obtained. Risks and benefits: risks, benefits and alternatives were discussed Time out: Immediately prior to procedure a "time out" was called to verify the correct patient, procedure, equipment, support staff and site/side marked as required.  Preparation: Patient was prepped and draped in the usual sterile fashion.  Vein Location: left antecubital  Ultrasound Guided  Gauge: 20  Normal blood return and flush without difficulty Patient tolerance: Patient tolerated the procedure well with no immediate complications.     Sherwood Gambler, MD 10/23/14 [phone]

## 2014-10-23 NOTE — ED Notes (Addendum)
Pt from home via EMS-Per EMS pt c/o gastroparesis x4 days. Pt reports N/V, not able to eat x4 days or have BM. Pt CBG 355 on scene. Pt sts she has not had insulin this am. Pt received 4 mg zofran IM en route. Pt is A&O and in NAD. Pt reports 10/10 bilateral  Upper quad pain.

## 2014-10-23 NOTE — ED Notes (Signed)
MD at bedside. 

## 2014-10-23 NOTE — ED Provider Notes (Signed)
CSN: [id number]     Arrival date & time 10/23/14  0919 History   First MD Initiated Contact with Patient 10/23/14 [phone]     Chief Complaint  Patient presents with  . Abdominal Pain  . Hyperglycemia     (Consider location/radiation/quality/duration/timing/severity/associated sxs/prior Treatment) HPI Comments: Patient with insulin-dependent diabetes -- presents with complaint of nausea, vomiting, upper abdominal pain for the past 4 days. Patient has history of DKA with frequent admissions. Also gastroparesis. She has been prescribed Reglan however patient states that she is noncompliant with all of her medications except for her insulin. Patient denies skin rash, chest pain, shortness of breath, cough, urinary symptoms, diarrhea. No treatments prior to arrival. The onset of this condition was acute. The course is gradually worsening. Aggravating factors: none. Alleviating factors: none.    Patient is a 44 y.o. female presenting with abdominal pain and hyperglycemia. The history is provided by the patient and medical records.  Abdominal Pain Associated symptoms: nausea and vomiting   Associated symptoms: no chest pain, no cough, no diarrhea, no dysuria, no fever and no sore throat   Hyperglycemia Associated symptoms: abdominal pain, nausea and vomiting   Associated symptoms: no chest pain, no dysuria and no fever     Past Medical History  Diagnosis Date  . Kidney stone   . Seizure disorder     started with pregnancy of first son  . Gastritis   . Bipolar 2 disorder   . Post traumatic stress disorder     "flipping out" after people close to her died  . Heart murmur   . Arthritis     r ankle-S/P surgery (2007)  . Anemia     childhood  . Depression     childhood  . Hepatitis C     biopsy in 2010-no rx-was supposed to see a hepatologist in [place].  With cirrhosis  . Neuropathy     in legs and feet and hands  . Cataracts, bilateral   . Scoliosis   . Diabetes mellitus      diagnosed in 1996-always been on insulin  . DKA (diabetic ketoacidoses)     Recurrent admissions for DKA, medication non-complaince, poor social situation  . Diabetic neuropathy, type I diabetes mellitus     numbness bilaterally feet  . HTN (hypertension) 10/12/2013   Past Surgical History  Procedure Laterality Date  . Ankle surgery  2007  . Endometrial biopsy  06/22/2012  . Cholecystectomy  04/07/2013  . Cholecystectomy N/A 04/07/2013    Procedure: LAPAROSCOPIC CHOLECYSTECTOMY WITH INTRAOPERATIVE CHOLANGIOGRAM;  Surgeon: Adin Hector, MD;  Location: [facility];  Service: General;  Laterality: N/A;  . Cesarean section      3 times   Family History  Problem Relation Age of Onset  . Diabetes Mother     currently 58  . Fibromyalgia Mother   . Cirrhosis Father     died in 27  . Diabetes Maternal Grandmother   . Diabetes Maternal Aunt    History  Substance Use Topics  . Smoking status: Current Every Day Smoker -- 0.10 packs/day for 25 years    Types: Cigarettes  . Smokeless tobacco: Never Used  . Alcohol Use: No     Comment: Endorses hasn't been drinking   OB History    Gravida Para Term Preterm AB TAB SAB Ectopic Multiple Living   4 3 3  0 1 0 1 0 1 2     Review of Systems  Constitutional: Negative for fever.  HENT: Negative for rhinorrhea and sore throat.   Eyes: Negative for redness.  Respiratory: Negative for cough.   Cardiovascular: Negative for chest pain.  Gastrointestinal: Positive for nausea, vomiting and abdominal pain. Negative for diarrhea and blood in stool.  Genitourinary: Negative for dysuria.  Musculoskeletal: Negative for myalgias.  Skin: Negative for rash.  Neurological: Negative for headaches.      Allergies  Penicillins  Home Medications   Prior to Admission medications   Medication Sig Start Date End Date Taking? Authorizing Provider  divalproex (DEPAKOTE) 500 MG DR tablet Take 1 tablet (500 mg total) by mouth 2 times daily at 12 noon and 4  pm. 08/17/14   Orson Eva, MD  gabapentin (NEURONTIN) 300 MG capsule Take 1 capsule (300 mg total) by mouth 3 (three) times daily. Patient not taking: Reported on 10/17/2014 10/12/14   Debbe Odea, MD  insulin aspart protamine- aspart (NOVOLOG MIX 70/30) (70-30) 100 UNIT/ML injection Inject 0.12 mLs (12 Units total) into the skin 2 (two) times daily with a meal. 10/12/14   Debbe Odea, MD  medroxyPROGESTERone (DEPO-PROVERA) 150 MG/ML injection Inject 150 mg into the muscle every 3 (three) months.    Historical Provider, MD  metoCLOPramide (REGLAN) 10 MG tablet Take 1 tablet (10 mg total) by mouth every 6 (six) hours as needed for nausea (nausea/headache). 10/18/14   Hannah Muthersbaugh, PA-C  promethazine (PHENERGAN) 25 MG suppository Place 1 suppository (25 mg total) rectally every 6 (six) hours as needed for nausea or vomiting. 10/12/14   Debbe Odea, MD  risperiDONE (RISPERDAL) 2 MG tablet Take 2 mg by mouth at bedtime.    Historical Provider, MD  traMADol (ULTRAM) 50 MG tablet Take 1-2 tablets (50-100 mg total) by mouth every 6 (six) hours as needed for moderate pain. Patient not taking: Reported on 10/17/2014 10/12/14   Debbe Odea, MD   BP 150/84 mmHg  Pulse 109  Temp(Src) 98.7 F (37.1 C) (Oral)  Resp 18  SpO2 100%   Physical Exam  Constitutional: She appears well-developed and well-nourished. She appears distressed.  Patient writhing in pain, asking for pain medication, gives limited history.   HENT:  Head: Normocephalic and atraumatic.  Right Ear: External ear normal.  Left Ear: External ear normal.  Nose: Nose normal.  Mouth/Throat: Mucous membranes are dry.  Eyes: Conjunctivae are normal. Right eye exhibits no discharge. Left eye exhibits no discharge.  Neck: Normal range of motion. Neck supple.  Cardiovascular: Regular rhythm and normal heart sounds.  Tachycardia present.   No murmur heard. Pulmonary/Chest: Effort normal and breath sounds normal. No respiratory distress. She has no  wheezes. She has no rales.  Abdominal: Soft. She exhibits no distension. Bowel sounds are decreased. There is tenderness (generalized, worse upper quadrants). There is no rebound and no guarding.  Musculoskeletal: She exhibits no edema or tenderness.  Neurological: She is alert.  Skin: Skin is warm and dry. No rash noted.  Psychiatric: She has a normal mood and affect.  Nursing note and vitals reviewed.   ED Course  Procedures (including critical care time) Labs Review Labs Reviewed  CBC WITH DIFFERENTIAL/PLATELET - Abnormal; Notable for the following:    WBC 12.2 (*)    RBC 5.91 (*)    MCV 73.8 (*)    MCH 23.4 (*)    Neutrophils Relative % 79 (*)    Neutro Abs 9.6 (*)    All other components within normal limits  COMPREHENSIVE METABOLIC PANEL - Abnormal; Notable for the following:  CO2 16 (*)    Glucose, Bld 420 (*)    Total Protein 8.2 (*)    AST 46 (*)    Total Bilirubin 1.6 (*)    All other components within normal limits  LIPASE, BLOOD - Abnormal; Notable for the following:    Lipase 18 (*)    All other components within normal limits  CBG MONITORING, ED - Abnormal; Notable for the following:    Glucose-Capillary 347 (*)    All other components within normal limits  TROPONIN I  PREGNANCY, URINE  URINALYSIS, ROUTINE W REFLEX MICROSCOPIC    Imaging Review No results found.   EKG Interpretation None       10:07 AM Patient seen and examined. Work-up initiated. Medications ordered.   Vital signs reviewed and are as follows: BP 150/84 mmHg  Pulse 109  Temp(Src) 98.7 F (37.1 C) (Oral)  Resp 18  SpO2 100%  11:19 AM Spoke with Dr. Sheran Fava who will see. Glucostabilizer started given AG=23, bicarb = 16. Suspect mild to moderate DKA.   Of note, Na corrects to > 145.   Discussed with Dr. Vanita Panda.   MDM   Final diagnoses:  Diabetic ketoacidosis without coma associated with type 1 diabetes mellitus   Admit DKA.     Carlisle Cater, PA-C 10/23/14  Ashley, MD 10/23/14 [phone]

## 2014-10-23 NOTE — ED Notes (Signed)
Per ICU, pt room is not clean at this time d/t deceased pt in there at this time. Pt will be moved when room is clean. ICU advises that it may be after 7pm

## 2014-10-24 ENCOUNTER — Inpatient Hospital Stay (HOSPITAL_COMMUNITY): Payer: Medicaid Other

## 2014-10-24 ENCOUNTER — Inpatient Hospital Stay: Payer: Self-pay | Admitting: Internal Medicine

## 2014-10-24 DIAGNOSIS — R079 Chest pain, unspecified: Secondary | ICD-10-CM

## 2014-10-24 DIAGNOSIS — E1065 Type 1 diabetes mellitus with hyperglycemia: Secondary | ICD-10-CM

## 2014-10-24 DIAGNOSIS — I471 Supraventricular tachycardia: Secondary | ICD-10-CM

## 2014-10-24 LAB — BASIC METABOLIC PANEL
ANION GAP: 12 (ref 5–15)
Anion gap: 4 — ABNORMAL LOW (ref 5–15)
Anion gap: 9 (ref 5–15)
BUN: 13 mg/dL (ref 6–20)
BUN: 11 mg/dL (ref 6–20)
BUN: 11 mg/dL (ref 6–20)
CALCIUM: 8.8 mg/dL — AB (ref 8.9–10.3)
CO2: 20 mmol/L — ABNORMAL LOW (ref 22–32)
CO2: 19 mmol/L — AB (ref 22–32)
CO2: 19 mmol/L — ABNORMAL LOW (ref 22–32)
CREATININE: 0.52 mg/dL (ref 0.44–1.00)
Calcium: 8.8 mg/dL — ABNORMAL LOW (ref 8.9–10.3)
Calcium: 9.1 mg/dL (ref 8.9–10.3)
Chloride: 116 mmol/L — ABNORMAL HIGH (ref 101–111)
Chloride: 110 mmol/L (ref 101–111)
Chloride: 106 mmol/L (ref 101–111)
Creatinine, Ser: 0.46 mg/dL (ref 0.44–1.00)
Creatinine, Ser: 0.53 mg/dL (ref 0.44–1.00)
GFR calc Af Amer: >60 mL/min (ref >60)
GFR calc Af Amer: >60 mL/min (ref >60)
GFR calc non Af Amer: >60 mL/min (ref >60)
GFR calc non Af Amer: >60 mL/min (ref >60)
GLUCOSE: 180 mg/dL — AB (ref 70–99)
GLUCOSE: 338 mg/dL — AB (ref 70–99)
Glucose, Bld: 306 mg/dL — ABNORMAL HIGH (ref 70–99)
Potassium: 3.4 mmol/L — ABNORMAL LOW (ref 3.5–5.1)
Potassium: 3.5 mmol/L (ref 3.5–5.1)
Potassium: 3.4 mmol/L — ABNORMAL LOW (ref 3.5–5.1)
Sodium: 140 mmol/L (ref 135–145)
Sodium: 138 mmol/L (ref 135–145)
Sodium: 137 mmol/L (ref 135–145)

## 2014-10-24 LAB — CBC
HCT: 35.5 % — ABNORMAL LOW (ref 36.0–46.0)
Hemoglobin: 11.1 g/dL — ABNORMAL LOW (ref 12.0–15.0)
MCH: 23.1 pg — ABNORMAL LOW (ref 26.0–34.0)
MCHC: 31.3 g/dL (ref 30.0–36.0)
MCV: 74 fL — AB (ref 78.0–100.0)
Platelets: 214 10*3/uL (ref 150–400)
RBC: 4.8 MIL/uL (ref 3.87–5.11)
RDW: 15 % (ref 11.5–15.5)
WBC: 11.4 10*3/uL — ABNORMAL HIGH (ref 4.0–10.5)

## 2014-10-24 LAB — GLUCOSE, CAPILLARY
GLUCOSE-CAPILLARY: 369 mg/dL — AB (ref 70–99)
GLUCOSE-CAPILLARY: 318 mg/dL — AB (ref 70–99)
GLUCOSE-CAPILLARY: 339 mg/dL — AB (ref 70–99)
Glucose-Capillary: 160 mg/dL — ABNORMAL HIGH (ref 70–99)
Glucose-Capillary: 201 mg/dL — ABNORMAL HIGH (ref 70–99)
Glucose-Capillary: 341 mg/dL — ABNORMAL HIGH (ref 70–99)

## 2014-10-24 LAB — MAGNESIUM: MAGNESIUM: 1.7 mg/dL (ref 1.7–2.4)

## 2014-10-24 LAB — TROPONIN I: Troponin I: <0.03 ng/mL (ref <0.031)

## 2014-10-24 LAB — PHOSPHORUS: Phosphorus: 1.9 mg/dL — ABNORMAL LOW (ref 2.5–4.6)

## 2014-10-24 MED ORDER — INSULIN GLARGINE 100 UNIT/ML ~~LOC~~ SOLN
15.0000 [IU] | Freq: Two times a day (BID) | SUBCUTANEOUS | Status: DC
  Administered 2014-10-24: 15 [IU] via SUBCUTANEOUS
  Filled 2014-10-24 (×2): qty 0.15

## 2014-10-24 MED ORDER — INSULIN ASPART 100 UNIT/ML ~~LOC~~ SOLN
5.0000 [IU] | Freq: Three times a day (TID) | SUBCUTANEOUS | Status: DC
  Administered 2014-10-24: 5 [IU] via SUBCUTANEOUS

## 2014-10-24 MED ORDER — LABETALOL HCL 5 MG/ML IV SOLN
10.0000 mg | Freq: Once | INTRAVENOUS | Status: AC
  Administered 2014-10-24: 10 mg via INTRAVENOUS
  Filled 2014-10-24: qty 4

## 2014-10-24 MED ORDER — MORPHINE SULFATE 2 MG/ML IJ SOLN
2.0000 mg | Freq: Once | INTRAMUSCULAR | Status: AC
Start: 2014-10-24 — End: 2014-10-24
  Administered 2014-10-24: 2 mg via INTRAVENOUS
  Filled 2014-10-24: qty 1

## 2014-10-24 MED ORDER — NITROGLYCERIN 0.4 MG SL SUBL
SUBLINGUAL_TABLET | SUBLINGUAL | Status: AC
  Administered 2014-10-24: 09:00:00
  Administered 2014-10-24: 0.4 mg
  Administered 2014-10-24: 09:00:00
  Filled 2014-10-24: qty 1

## 2014-10-24 MED ORDER — ENSURE ENLIVE PO LIQD: 237.0000 mL | Freq: Two times a day (BID) | ORAL | Status: DC

## 2014-10-24 MED ORDER — PRO-STAT SUGAR FREE PO LIQD
30.0000 mL | Freq: Two times a day (BID) | ORAL | Status: DC
  Administered 2014-10-24 – 2014-10-26 (×5): 30 mL via ORAL
  Filled 2014-10-24 (×5): qty 30

## 2014-10-24 MED ORDER — GLUCERNA SHAKE PO LIQD
237.0000 mL | Freq: Three times a day (TID) | ORAL | Status: DC
  Administered 2014-10-24 – 2014-10-25 (×4): 237 mL via ORAL
  Filled 2014-10-24 (×7): qty 237

## 2014-10-24 NOTE — Progress Notes (Signed)
Initial Nutrition Assessment   Pt meets criteria for severe malnutrition in the context of acute illness, as evidenced by 13% wt loss in 2 weeks and energy intake </= 50% for >/=5 days  DOCUMENTATION CODES:  Severe malnutrition in context of acute illness/injury  INTERVENTION: Prostat BID, each provides 15 g protein and 100 kcal  Glucerna Shake, each provides 220 kcal and 10 g protein  NUTRITION DIAGNOSIS:  Malnutrition related to social / environmental circumstances, acute illness as evidenced by percent weight loss, energy intake < or equal to 50% for > or equal to 5 days.  GOAL:  Patient will meet greater than or equal to 90% of their needs  MONITOR:  PO intake, Supplement acceptance, Weight trends, Labs  REASON FOR ASSESSMENT:  Malnutrition Screening Tool   ASSESSMENT: Patient is a 44 y.o. year-old female with history of poorly controlled DM 1 with peripheral neuropathy and severe gastroparesis, 14 admissions for DKA in the last 12 months, seizure disorder, hepatitis C with cirrhosis-not on treatment, hypertension, poor compliance with medications, bipolar disorder, and polysubstance abuse who presents again with nausea, vomiting, and DKA  Pt states her appetite gets better and worse depending if she has her medications. Pt states her usual weight varies 100 - 120 Lb, but per weight history she lost 17 Lb in 2 weeks (13%, significant for time frame). She usually eats 2 meals per day and tries to snack, but that is not always possible. Pt ordered breakfast this morning, but only ate applesauce, she is hoping to be more hungry for lunch. Pt states she likes Glucerna Shakes, but is unable to afford them. RD will provide coupons. Pt is also willing to try Prostat, RD to order.  Pt states she had extensive education about her diabetes and reccuring DKA's and knows what to do, but her social situation is not ideal which makes it difficult to be compliant with medications. Will continue  to monitor. Labs reviewed: Glu 341, Ca 8.8  Height:  Ht Readings from Last 1 Encounters:  10/23/14 5\' 4"  (1.626 m)    Weight:  Wt Readings from Last 1 Encounters:  10/23/14 115 lb 1.3 oz (52.2 kg)    Ideal Body Weight:  54.55 kg  Wt Readings from Last 10 Encounters:  10/23/14 115 lb 1.3 oz (52.2 kg)  10/09/14 132 lb 4.4 oz (60 kg)  09/27/14 120 lb (54.432 kg)  08/14/14 114 lb 10.2 oz (52 kg)  08/10/14 125 lb (56.7 kg)  07/26/14 112 lb 7 oz (51 kg)  06/06/14 130 lb (58.968 kg)  05/31/14 133 lb (60.328 kg)  05/20/14 131 lb (59.421 kg)  05/18/14 124 lb 9.6 oz (56.518 kg)    BMI:  Body mass index is 19.74 kg/(m^2).  Estimated Nutritional Needs:  Kcal:  1600 - 1800  Protein:  70 - 85 g  Fluid:  1.7 L  Skin:  Reviewed, no issues  Diet Order:  Diet Carb Modified Fluid consistency:: Thin; Room service appropriate?: Yes  EDUCATION NEEDS:  No education needs identified at this time   Intake/Output Summary (Last 24 hours) at 10/24/14 1440 Last data filed at 10/24/14 0900  Gross per 24 hour  Intake    533 ml  Output      0 ml  Net    533 ml    Last BM:  5/06  Mary [name] [facility] Dietetic Intern Pager: [phone] 10/24/2014 2:40 PM

## 2014-10-24 NOTE — Progress Notes (Signed)
  Echocardiogram 2D Echocardiogram has been performed.  Mary [name] 10/24/2014, 5:05 PM

## 2014-10-24 NOTE — Progress Notes (Signed)
Patient has arrived in room 1414 from [facility] ED. PCP on call was notified that the patient had arrived on the floor.

## 2014-10-24 NOTE — Care Management Note (Signed)
Case Management Note  Patient Details  Name: Mary [name] MRN: [id number] Date of Birth: 1971/04/17  Subjective/Objective:    44 y/o f admitted w/DKA.Readmit 4/24-4/27/16.                Action/Plan:From  Home. D/c plan home w/HHC.Will explore [facility].   Expected Discharge Date:                  Expected Discharge Plan:  [place]  In-House Referral:     Discharge planning Services  CM Consult  Post Acute Care Choice:    Choice offered to:  Patient  DME Arranged:    DME Agency:     HH Arranged:  RN (HHRN/social worker-non compliance/transportation) [place] Agency:  [place]  Status of Service:  In process, will continue to follow  Medicare Important Message Given:    Date Medicare IM Given:    Medicare IM give by:    Date Additional Medicare IM Given:    Additional Medicare Important Message give by:     If discussed at [place] of Stay Meetings, dates discussed:    Additional Comments:[facility] [place] pharmacy-spoke to [facility]-patient enrolled in patient assistance program-She will script for lantus,Novolog, & other meds.they have samples of novolog.[facility] HHRN aware of non compliance issues, & transportation issues. Referred to [facility]-will await response if they can assist.Patient voiced understanding.  Dessa Phi, RN 10/24/2014, 12:18 PM

## 2014-10-24 NOTE — Progress Notes (Signed)
Notified by CMT that pt HR was sustaining in 130's and spiking into 140's. Assessed patient and patient complained of chest pain. Performed EKG. Notified MD. [name] pt 3 doses of nitro. Pain subsided after third dose. MD read EKG and ordered troponin on patient. Will continue to monitor.  Mary [name]

## 2014-10-24 NOTE — Progress Notes (Signed)
[facility] PROGRESS NOTE  Mary [name] VFI:[id number] DOB: Nov 05, 1970 DOA: 10/23/2014 PCP: Mary Manning, NP   Brief narrative 44 year old female with poorly controlled type 1 diabetes mellitus with peripheral neuropathy and severe gastroparesis presented with nausea vomiting and DKA. This is her 14th hospitalization in the past 12 months for DKA early in the setting of medication noncompliance and poor outpatient follow-up. 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 Patient admitted to telemetry.  Assessment/Plan: Diabetes ketoacidosis in the setting of poorly controlled type 1 diabetes mellitus and severe diabetic gastroparesis Numerous hospitalization for DKA. On almost every admission the issue patient has is is unable to purchase her insulin, malfunctioned insulin syringe or lack of transport to these to pharmacy or outpatient follow-up. Had extensive discussion in the interdisciplinary rounds with the case manager.  Plan to enroll patient into patient assistance program to provide scripts and insulin samples. Will arrange home health RN also. [facility] DKA has now resolved. On Lantus 15 his twice a day with 5 units 3 times a day of NovoLog for meal coverage. Patient reports taking 70/30, 12 units twice a day at home along with Lantus of 35 units at bedtime (has not used Lantus since past 1 week stating the needle broke off. This was the same reason she gave when she got admitted last time.)  Discussed again in detail the importance of medication adherence and outpatient follow-up.  Active problem  Abdominal pain, nausea and vomiting with diabetes gastroparesis Continue when necessary Reglan, IV fluids. Discontinued Ultram. Continue Neurontin for neuropathy. Minimizing narcotics.  Chest pain with sinus tachycardia Appears musculoskeletal and associated with gastroparesis. EKG  shows sinus tachycardia. Troponin negative. Received 2 doses of nitroglycerin , IV labetalol 10 mg x 1 and one dose of IV morphine. -Continue Reglan alternating with Phenergan PRN for nausea and vomiting.  Protein calorie malnutrition, severe Continue nutrition supplements  Hepatitis C Not on treatment. Needs outpatient GI referral.  Anemia Possibly of chronic disease  Bipolar disorder Continue Risperdal and Depakote  Polysubstance abuse Counseled on smoking and marijuana cessation  Hypokalemia Replenished  Diet: Diabetic  DVT prophylaxis: Subcutaneous Lovenox   Code Status: Full code Family Communication: None at bedside Disposition Plan: Home tomorrow with home health RN   Consultants:  None  Procedures:  None  Antibiotics:  None  HPI/Subjective: Patient seen and examined. Complaining of substernal chest pain and tachycardic up to 140s. EKG done and given 2 doses of nitroglycerin with relief. Pain reproducible on exam  Objective: Filed Vitals:   10/24/14 1321  BP: 137/83  Pulse: 89  Temp: 98.4 F (36.9 C)  Resp: 18    Intake/Output Summary (Last 24 hours) at 10/24/14 1337 Last data filed at 10/24/14 0900  Gross per 24 hour  Intake    533 ml  Output      0 ml  Net    533 ml   Filed Weights   10/23/14 2342  Weight: 52.2 kg (115 lb 1.3 oz)    Exam:   General:  Middle aged female lying in bed in distress with chest discomfort  HEENT: No pallor, dry oral mucosa, supple neck  Cardiovascular: S1 and S2 tachycardic, no murmurs rub or gallop, reproducible pain on pressure over the chest  Respiratory: Clear bilaterally, no added sounds  Abdomen: Soft, nontender, nondistended, bowel sounds present  Musculoskeletal: Warm, no edema  CNS: Alert and oriented   Data Reviewed:  Basic Metabolic Panel:  Recent Labs Lab 10/23/14 1853 10/23/14 2220 10/24/14 0104 10/24/14 0542 10/24/14 0925  NA 144 145 140 138 137  K 4.2 3.8 3.4* 3.5 3.4*   CL 116* 112* 116* 110 106  CO2 15* 20* 20* 19* 19*  GLUCOSE 286* 241* 180* 306* 338*  BUN 12 12 13 11 11   CREATININE 0.74 0.63 0.46 0.52 0.53  CALCIUM 8.9 9.3 8.8* 8.8* 9.1  MG  --   --  1.7  --   --   PHOS  --   --  1.9*  --   --    Liver Function Tests:  Recent Labs Lab 10/17/14 2301 10/23/14 1010  AST 60* 46*  ALT 54 49  ALKPHOS 89 86  BILITOT 1.8* 1.6*  PROT 8.8* 8.2*  ALBUMIN 4.8 4.5    Recent Labs Lab 10/17/14 2301 10/23/14 1010  LIPASE 17* 18*   No results for input(s): AMMONIA in the last 168 hours. CBC:  Recent Labs Lab 10/17/14 2301 10/23/14 1010 10/24/14 0542  WBC 14.4* 12.2* 11.4*  NEUTROABS 8.9* 9.6*  --   HGB 14.1 13.8 11.1*  HCT 44.6 43.6 35.5*  MCV 73.8* 73.8* 74.0*  PLT 211 283 214   Cardiac Enzymes:  Recent Labs Lab 10/23/14 1010 10/24/14 0925  TROPONINI <0.03 <0.03   BNP (last 3 results) No results for input(s): BNP in the last 8760 hours.  ProBNP (last 3 results) No results for input(s): PROBNP in the last 8760 hours.  CBG:  Recent Labs Lab 10/23/14 2213 10/23/14 2341 10/24/14 0136 10/24/14 0738 10/24/14 1204  GLUCAP 214* 201* 160* 341* 369*    No results found for this or any previous visit (from the past 240 hour(s)).   Studies: No results found.  Scheduled Meds: . divalproex  500 mg Oral q12n4p  . enoxaparin (LOVENOX) injection  40 mg Subcutaneous QHS  . feeding supplement (GLUCERNA SHAKE)  237 mL Oral TID BM  . feeding supplement (PRO-STAT SUGAR FREE 64)  30 mL Oral BID  . gabapentin  300 mg Oral TID  . insulin aspart  0-5 Units Subcutaneous QHS  . insulin aspart  0-9 Units Subcutaneous TID WC  . insulin glargine  10 Units Subcutaneous BID  . [START ON 10/25/2014] medroxyPROGESTERone  150 mg Intramuscular Q90 days  . metoCLOPramide  10 mg Oral TID AC & HS  . risperiDONE  2 mg Oral QHS   Continuous Infusions: . dextrose 5 % and 0.45% NaCl 10 mL/hr at 10/24/14 3710      Time spent: [place], [place] [facility] Pager [phone] If 7PM-7AM, please contact night-coverage at [email or web address], password [facility] 10/24/2014, 1:37 PM  LOS: 1 day

## 2014-10-24 NOTE — Discharge Instructions (Signed)
[facility] Stay Proper nutrition can help your body recover from illness and injury.   Foods and beverages high in protein, vitamins, and minerals help rebuild muscle loss, promote healing, & reduce fall risk.   In addition to eating healthy foods, a nutrition shake is an easy, delicious way to get the nutrition you need during and after your hospital stay  It is recommended that you continue to drink 3 bottles per day of:       Glucerna Shake for at least 1 month (30 days) after your hospital stay   Tips for adding a nutrition shake into your routine: As allowed, drink one with vitamins or medications instead of water or juice Enjoy one as a tasty mid-morning or afternoon snack Drink cold or make a milkshake out of it Drink one instead of milk with cereal or snacks Use as a coffee creamer   Available at the following grocery stores and pharmacies:           * [place] [phone]            For COUPONS visit: www.ensure.com/join or http://dawson-may.com/   Suggested Substitutions Ensure Plus = Boost Plus = Carnation Breakfast Essentials = Boost Compact Ensure Active Clear = Boost Breeze Glucerna Shake = Boost Glucose Control = Carnation Breakfast Essentials SUGAR FREE

## 2014-10-24 NOTE — Progress Notes (Signed)
Spoke with Dr. Clementeen Graham via phone regarding PICC order.   He stated,  "She does not need it now".   He will cancel the order.

## 2014-10-24 NOTE — Progress Notes (Signed)
Primary RN, Joellen Jersey made aware of my discussion with Dr. Clementeen Graham regarding cancelling the PICC order.

## 2014-10-24 NOTE — Progress Notes (Signed)
Inpatient Diabetes Program Recommendations  AACE/ADA: New Consensus Statement on Inpatient Glycemic Control (2013)  Target Ranges:  Prepandial:   less than 140 mg/dL      Peak postprandial:   less than 180 mg/dL (1-2 hours)      Critically ill patients:  140 - 180 mg/dL   Reason for Visit: Diabetes Consult  Diabetes history: DM1 Outpatient Diabetes medications: 70/30 12 units bid, was previously taking Lantus 35 units QHS. (States pens are defective and has not taken in several weeks.) Current orders for Inpatient glycemic control: Lantus 10 units bid, Novolog sensitive tidwc and hs  Pt states she does not have transportation to get to pharmacy and exchange defective Lantus pens. "I need my Reglan too."  Have talked with pt numerous times regarding importance of taking insulin as prescribed, monitoring blood sugars and f/u with PCP. Pt said she is waiting to get her disability and then she will be ok.  Results for PERMELIA, BAMBA (MRN [id number]) as of 10/24/2014 11:43  Ref. Range 10/23/2014 21:08 10/23/2014 22:13 10/23/2014 23:41 10/24/2014 01:36 10/24/2014 07:38  Glucose-Capillary Latest Ref Range: 70-99 mg/dL 225 (H) 214 (H) 201 (H) 160 (H) 341 (H)  Results for JAYLNN, ULLERY (MRN [id number]) as of 10/24/2014 11:43  Ref. Range 10/24/2014 09:25  Sodium Latest Ref Range: 135-145 mmol/L 137  Potassium Latest Ref Range: 3.5-5.1 mmol/L 3.4 (L)  Chloride Latest Ref Range: 101-111 mmol/L 106  CO2 Latest Ref Range: 22-32 mmol/L 19 (L)  BUN Latest Ref Range: 6-20 mg/dL 11  Creatinine Latest Ref Range: 0.44-1.00 mg/dL 0.53  Calcium Latest Ref Range: 8.9-10.3 mg/dL 9.1  EGFR (Non-African Amer.) Latest Ref Range: >60 mL/min >60  EGFR (African American) Latest Ref Range: >60 mL/min >60  Glucose Latest Ref Range: 70-99 mg/dL 338 (H)  Anion gap Latest Ref Range: 5-15  12   Uncontrolled blood sugars. Needs more basal, in addition to meal coverage insulin.  Recommendations: Increase Lantus to 15 units  bid. Add Novolog 5 units tidwc for meal coverage insulin if pt eats >50% meal.  Will continue to follow. Thank you. Lorenda Peck, RD, LDN, CDE Inpatient Diabetes Coordinator [phone]

## 2014-10-24 NOTE — Care Management Note (Signed)
Case Management Note  Patient Details  Name: TRUDA STAUB MRN: [id number] Date of Birth: 07-08-1970  Subjective/Objective:                    Action/Plan:   Expected Discharge Date:                  Expected Discharge Plan:  [place]  In-House Referral:     Discharge planning Services  CM Consult  Post Acute Care Choice:    Choice offered to:  Patient  DME Arranged:    DME Agency:     HH Arranged:  RN (HHRN/social worker-non compliance/transportation) [place] Agency:  [place]  Status of Service:  In process, will continue to follow  Medicare Important Message Given:    Date Medicare IM Given:    Medicare IM give by:    Date Additional Medicare IM Given:    Additional Medicare Important Message give by:     If discussed at [place] of Stay Meetings, dates discussed:    Additional Comments:[facility] familiar w/patient.Will need script for Novolog-[facility] will have samples, need script for lantus-[facility] will provide.[facility] for [facility].HHRN/SW-patient meets patient asst program via [place].Financial counselor went to see patient for f/u w/financial paperwork required-patient states she mailed in the paperwork needed.Financial counselor to date has not received any paperwork via mail.Informed [place] of patient isuues w/transportation to pick up meds-they do not have mail order or any form of transportation services to pick up meds.[facility] SW-to asst w/resources for transportation.  Dessa Phi, RN 10/24/2014, 5:33 PM

## 2014-10-25 DIAGNOSIS — E876 Hypokalemia: Secondary | ICD-10-CM

## 2014-10-25 LAB — GLUCOSE, CAPILLARY
GLUCOSE-CAPILLARY: 338 mg/dL — AB (ref 70–99)
GLUCOSE-CAPILLARY: 196 mg/dL — AB (ref 70–99)
GLUCOSE-CAPILLARY: 242 mg/dL — AB (ref 70–99)
Glucose-Capillary: 407 mg/dL — ABNORMAL HIGH (ref 70–99)

## 2014-10-25 LAB — CBC
HEMATOCRIT: 35.9 % — AB (ref 36.0–46.0)
HEMOGLOBIN: 11.7 g/dL — AB (ref 12.0–15.0)
MCH: 23.7 pg — AB (ref 26.0–34.0)
MCHC: 32.6 g/dL (ref 30.0–36.0)
MCV: 72.7 fL — ABNORMAL LOW (ref 78.0–100.0)
Platelets: 171 10*3/uL (ref 150–400)
RBC: 4.94 MIL/uL (ref 3.87–5.11)
RDW: 14.9 % (ref 11.5–15.5)
WBC: 9.9 10*3/uL (ref 4.0–10.5)

## 2014-10-25 LAB — BASIC METABOLIC PANEL
Anion gap: 7 (ref 5–15)
BUN: 13 mg/dL (ref 6–20)
CHLORIDE: 103 mmol/L (ref 101–111)
CO2: 24 mmol/L (ref 22–32)
Calcium: 8.9 mg/dL (ref 8.9–10.3)
Creatinine, Ser: 0.46 mg/dL (ref 0.44–1.00)
GFR calc Af Amer: >60 mL/min (ref >60)
Glucose, Bld: 349 mg/dL — ABNORMAL HIGH (ref 70–99)
POTASSIUM: 3.1 mmol/L — AB (ref 3.5–5.1)
SODIUM: 134 mmol/L — AB (ref 135–145)

## 2014-10-25 MED ORDER — POTASSIUM CHLORIDE CRYS ER 20 MEQ PO TBCR
40.0000 meq | EXTENDED_RELEASE_TABLET | Freq: Once | ORAL | Status: AC
  Administered 2014-10-25: 40 meq via ORAL
  Filled 2014-10-25: qty 2

## 2014-10-25 MED ORDER — INSULIN GLARGINE 100 UNIT/ML ~~LOC~~ SOLN
20.0000 [IU] | Freq: Two times a day (BID) | SUBCUTANEOUS | Status: DC
  Administered 2014-10-25 – 2014-10-26 (×3): 20 [IU] via SUBCUTANEOUS
  Filled 2014-10-25 (×3): qty 0.2

## 2014-10-25 MED ORDER — INSULIN ASPART 100 UNIT/ML ~~LOC~~ SOLN
8.0000 [IU] | Freq: Three times a day (TID) | SUBCUTANEOUS | Status: DC
  Administered 2014-10-25 – 2014-10-26 (×4): 8 [IU] via SUBCUTANEOUS

## 2014-10-25 MED ORDER — INSULIN GLARGINE 100 UNIT/ML ~~LOC~~ SOLN: 25.0000 [IU] | Freq: Two times a day (BID) | SUBCUTANEOUS | Status: DC

## 2014-10-25 NOTE — Progress Notes (Signed)
BRIEF NUTRITION NOTE  RN called to report pt with CBG of over 300 after breakfast and recent spike to 407 mg/dL. She states pt ate eggs, bacon, and had a few small bites of pancake and a handful of grapes for breakfast. After breakfast pt was given ProStat and Glucerna per order and after this is when blood sugar spike occurred. Talked with RN about holding 2 PM administration of Glucerna to determine if it is affected CBGs. RN reports pt not on steroids at this time.  Will continue to follow per protocol; full assessment done yesterday.   Jarome Matin, RD, LDN Inpatient Clinical Dietitian Pager # [phone] After hours/weekend pager # [phone]

## 2014-10-25 NOTE — Progress Notes (Signed)
[facility] PROGRESS NOTE  Mary [name] VZC:[id number] DOB: [birth year] DOA: 10/23/2014 PCP: [name], NP   Brief narrative 44 year old female with poorly controlled type 1 diabetes mellitus with peripheral neuropathy and severe gastroparesis presented with nausea vomiting and DKA. This is her 14th hospitalization in the past 12 months for DKA early in the setting of medication noncompliance and poor outpatient follow-up. 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 Patient admitted to telemetry.  Assessment/Plan: Diabetes ketoacidosis in the setting of poorly controlled type 1 diabetes mellitus and severe diabetic gastroparesis Numerous hospitalization for DKA. On almost every admission the issue patient has is is unable to purchase her insulin, malfunctioned insulin syringe or lack of transport to these to pharmacy or outpatient follow-up. Had extensive discussion in the interdisciplinary rounds with the case manager.  Plan to enroll patient into patient assistance program to provide scripts and insulin samples. Will arrange home health RN also. Referral made to [facility].  DKA has now resolved. Patient reports taking 70/30, 12 units twice a day at home along with Lantus of 35 units at bedtime (has not used Lantus since past 1 week stating the needle broke off. This was the same reason she gave when she got admitted last time.)  -fsg elevated to 300s-400s. Increased lantus to 20 u bid and premeal aspart 8 u tid with SSI. -Discussed again in detail the importance of medication adherence and outpatient follow-up.  Active problem  Abdominal pain, nausea and vomiting with diabetes gastroparesis Continue when necessary Reglan, IV fluids. Discontinued Ultram. Continue Neurontin  Minimizing narcotics.  Chest pain with sinus tachycardia on 5/9  musculoskeletal and associated with gastroparesis. EKG shows  sinus tachycardia. Troponin negative.  -Symptoms resolved.  Protein calorie malnutrition, severe Continue nutrition supplements  Hepatitis C Not on treatment. Needs outpatient GI referral.  Anemia Possibly of chronic disease  Bipolar disorder Continue Risperdal and Depakote  Polysubstance abuse Counseled on smoking and marijuana cessation  Hypokalemia Replenished  Diet: Diabetic  DVT prophylaxis: Subcutaneous Lovenox   Code Status: Full code  Family Communication: None at bedside  Disposition Plan: Home on 5/11 with home health RN eighth blood glucose better controlled.   Consultants:  None  Procedures:  None  Antibiotics:  None  HPI/Subjective: Patient seen and examined. Denies further chest pain symptoms. Blood glucose is elevated.  Objective: Filed Vitals:   10/25/14 0539  BP: 152/77  Pulse: 90  Temp: 99.1 F (37.3 C)  Resp: 18    Intake/Output Summary (Last 24 hours) at 10/25/14 1259 Last data filed at 10/25/14 1113  Gross per 24 hour  Intake  488.5 ml  Output    800 ml  Net -311.5 ml   Filed Weights   10/23/14 2342  Weight: 52.2 kg (115 lb 1.3 oz)    Exam:   General:  Middle aged female lying in bed in no acute distress   HEENT: Moist oral mucosa, supple neck  Cardiovascular: Normal S1 and S2 tachycardic, no murmurs rub or gallop,   Respiratory: Clear bilaterally, no added sounds  Abdomen: Soft, nontender, nondistended, bowel sounds present  Musculoskeletal: Warm, no edema    Data Reviewed: Basic Metabolic Panel:  Recent Labs Lab 10/23/14 2220 10/24/14 0104 10/24/14 0542 10/24/14 0925 10/25/14 0455  NA 145 140 138 137 134*  K 3.8 3.4* 3.5 3.4* 3.1*  CL 112* 116* 110 106 103  CO2 20* 20* 19* 19* 24  GLUCOSE 241* 180*  306* 338* 349*  BUN 12 13 11 11 13   CREATININE 0.63 0.46 0.52 0.53 0.46  CALCIUM 9.3 8.8* 8.8* 9.1 8.9  MG  --  1.7  --   --   --   PHOS  --  1.9*  --   --   --    Liver Function  Tests:  Recent Labs Lab 10/23/14 1010  AST 46*  ALT 49  ALKPHOS 86  BILITOT 1.6*  PROT 8.2*  ALBUMIN 4.5    Recent Labs Lab 10/23/14 1010  LIPASE 18*   No results for input(s): AMMONIA in the last 168 hours. CBC:  Recent Labs Lab 10/23/14 1010 10/24/14 0542 10/25/14 0455  WBC 12.2* 11.4* 9.9  NEUTROABS 9.6*  --   --   HGB 13.8 11.1* 11.7*  HCT 43.6 35.5* 35.9*  MCV 73.8* 74.0* 72.7*  PLT 283 214 171   Cardiac Enzymes:  Recent Labs Lab 10/23/14 1010 10/24/14 0925 10/24/14 1553 10/24/14 2120  TROPONINI <0.03 <0.03 <0.03 <0.03   BNP (last 3 results) No results for input(s): BNP in the last 8760 hours.  ProBNP (last 3 results) No results for input(s): PROBNP in the last 8760 hours.  CBG:  Recent Labs Lab 10/24/14 1204 10/24/14 1717 10/24/14 2112 10/25/14 0733 10/25/14 1120  GLUCAP 369* 318* 339* 338* 407*    No results found for this or any previous visit (from the past 240 hour(s)).   Studies: No results found.  Scheduled Meds: . divalproex  500 mg Oral q12n4p  . enoxaparin (LOVENOX) injection  40 mg Subcutaneous QHS  . feeding supplement (GLUCERNA SHAKE)  237 mL Oral TID BM  . feeding supplement (PRO-STAT SUGAR FREE 64)  30 mL Oral BID  . gabapentin  300 mg Oral TID  . insulin aspart  0-5 Units Subcutaneous QHS  . insulin aspart  0-9 Units Subcutaneous TID WC  . insulin aspart  8 Units Subcutaneous TID WC  . insulin glargine  20 Units Subcutaneous BID  . medroxyPROGESTERone  150 mg Intramuscular Q90 days  . metoCLOPramide  10 mg Oral TID AC & HS  . potassium chloride  40 mEq Oral Once  . risperiDONE  2 mg Oral QHS   Continuous Infusions:      Time spent: [place], [place]  [facility] Pager [phone] If 7PM-7AM, please contact night-coverage at [email or web address], password [facility] 10/25/2014, 12:59 PM  LOS: 2 days

## 2014-10-25 NOTE — Progress Notes (Signed)
MD paged due to patient's CBG of 407. Sliding scale cut off is <400.  No new orders received for additional Novolog units to be given.  17 units given (9 for CBG and 8 for meal coverage) Paged dietician regarding possible causes of increase in CBG.  She stated to hold 1400 dose of Glucerna and assess if CBG improves at 1700 CBG check.  Will continue to monitor.

## 2014-10-25 NOTE — Consult Note (Signed)
   [facility] CM Inpatient Consult   10/25/2014  Mary [name] 11/03/1970 [id number]  Received referral for [place] Management services. Unfortunately, patient is not eligible as she is not in a [facility] affiliated insurance risk contract. Inpatient RNCM made aware that patient not eligible. Appreciative of referral however.  Marthenia Rolling, MSN-Ed, RN,BSN [facility] Liaison [phone]

## 2014-10-26 DIAGNOSIS — E0810 Diabetes mellitus due to underlying condition with ketoacidosis without coma: Secondary | ICD-10-CM

## 2014-10-26 DIAGNOSIS — D509 Iron deficiency anemia, unspecified: Secondary | ICD-10-CM

## 2014-10-26 LAB — CBC
HEMATOCRIT: 36.8 % (ref 36.0–46.0)
Hemoglobin: 11.5 g/dL — ABNORMAL LOW (ref 12.0–15.0)
MCH: 22.8 pg — ABNORMAL LOW (ref 26.0–34.0)
MCHC: 31.3 g/dL (ref 30.0–36.0)
MCV: 72.9 fL — AB (ref 78.0–100.0)
Platelets: 155 10*3/uL (ref 150–400)
RBC: 5.05 MIL/uL (ref 3.87–5.11)
RDW: 14.9 % (ref 11.5–15.5)
WBC: 8.6 10*3/uL (ref 4.0–10.5)

## 2014-10-26 LAB — BASIC METABOLIC PANEL
Anion gap: 13 (ref 5–15)
BUN: 16 mg/dL (ref 6–20)
CHLORIDE: 100 mmol/L — AB (ref 101–111)
CO2: 24 mmol/L (ref 22–32)
Calcium: 9.3 mg/dL (ref 8.9–10.3)
Creatinine, Ser: 0.47 mg/dL (ref 0.44–1.00)
GFR calc non Af Amer: >60 mL/min (ref >60)
GLUCOSE: 315 mg/dL — AB (ref 70–99)
POTASSIUM: 4.3 mmol/L (ref 3.5–5.1)
Sodium: 137 mmol/L (ref 135–145)

## 2014-10-26 LAB — GLUCOSE, CAPILLARY: GLUCOSE-CAPILLARY: 325 mg/dL — AB (ref 70–99)

## 2014-10-26 MED ORDER — METOCLOPRAMIDE HCL 10 MG PO TABS: 10.0000 mg | ORAL_TABLET | Freq: Four times a day (QID) | ORAL | Status: DC | PRN

## 2014-10-26 MED ORDER — INSULIN DETEMIR 100 UNIT/ML FLEXPEN: 55.0000 [IU] | PEN_INJECTOR | Freq: Every day | SUBCUTANEOUS | Status: AC

## 2014-10-26 MED ORDER — INSULIN GLARGINE 100 UNIT/ML ~~LOC~~ SOLN: 35.0000 [IU] | Freq: Two times a day (BID) | SUBCUTANEOUS | Status: DC

## 2014-10-26 MED ORDER — INSULIN GLARGINE 100 UNIT/ML ~~LOC~~ SOLN: 10.0000 [IU] | Freq: Every day | SUBCUTANEOUS | Status: DC

## 2014-10-26 MED ORDER — INSULIN ASPART 100 UNIT/ML FLEXPEN: 5.0000 [IU] | PEN_INJECTOR | Freq: Three times a day (TID) | SUBCUTANEOUS | Status: DC

## 2014-10-26 MED ORDER — INSULIN GLARGINE 100 UNIT/ML ~~LOC~~ SOLN
10.0000 [IU] | SUBCUTANEOUS | Status: AC
  Administered 2014-10-26: 10 [IU] via SUBCUTANEOUS
  Filled 2014-10-26: qty 0.1

## 2014-10-26 MED ORDER — FERROUS SULFATE 325 (65 FE) MG PO TBEC: 325.0000 mg | DELAYED_RELEASE_TABLET | Freq: Three times a day (TID) | ORAL | Status: DC

## 2014-10-26 MED ORDER — INSULIN GLARGINE 100 UNIT/ML ~~LOC~~ SOLN
10.0000 [IU] | Freq: Every day | SUBCUTANEOUS | Status: DC
  Filled 2014-10-26: qty 0.1

## 2014-10-26 NOTE — Discharge Summary (Signed)
Physician Discharge Summary  [name]:[id number] DOB: May 07, 1971 DOA: 10/23/2014  PCP: [name], NP  Admit date: 10/23/2014 Discharge date: 10/26/2014  Time spent: 35 minutes  Recommendations for Outpatient Follow-up:  1. PCP in one week, please check her CBGs that we she will be checking 3 times a day and adjust insulin as needed.    Discharge Diagnoses:  Active Problems:   Seizure disorder   Microcytic anemia   Bipolar 1 disorder   DM (diabetes mellitus), type 1, uncontrolled   Gastroparesis due to DM   Chronic abdominal pain   DKA (diabetic ketoacidoses)   Hepatitis C   Discharge Condition: stable  Diet recommendation: carb mod  Filed Weights   10/23/14 2342  Weight: 52.2 kg (115 lb 1.3 oz)    History of present illness:  44 y.o. 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 She was last discharged on 4/27 supposedly on 70/30 12 units BID. Since discharge, she states she took her insulin twice a day as prescribed. Her CBGs have been typically in the 200-300 range but several > 400, none less than 70. She missed this morning's dose. A week after discharge from the hospital, she ran out of her Reglan. She used to Zofran instead but had worsening nausea, vomiting and abdominal pain. She vomited once or twice a day usually yellow green emesis, possibly some red streaks, no diarrhea or constipation. She has severe episodic stabbing epigastric abdominal pain that occasionally radiates to her back and lasts for about 6 hours before resolving on its own. It will go away a little sooner if she soaks in water or takes pain medication. She used up her Ultram in one  week.   [facility] in the setting of poorly controlled diabetes type 1 with severe diabetic gastroparesis: - Due to noncompliance as patient is unable to purchase her insulin. - She was started on IV insulin drip once her bicarbonate was greater than 20 and her anion gap closed she was changed to long-acting insulin plus sliding scale coverage. - Regimen was changed to Lantus 55 units once a day with 5 units of NovoLog with meals. She will check her CBGs before meals and at bedtime - She will follow-up with her primary care doctor on 10/28/2014. Her insulin will be titrated as needed then.  Abdominal pain with nausea and vomiting due to diabetes gastroparesis: - She was started on IV Reglan IV fluids her nausea and vomiting improved. - Continue Reglan current regimen at home.  Chest pain with sinus tachycardia: Likely musculoskeletal and due to all the dry heaving, troponins were negative symptoms resolved.  Severe protein caloric malnutrition: - Continue supplements at home.  Hepatitis C: No treatment will follow-up with GI as an outpatient.  Microcytic anemia: - We'll start on ferrous sulfate 3 times a day.  Polysubstance abuse Counseled on smoking and marijuana cessation  Hypokalemia Resolved, due to Nausea and vomiting.  Procedures:  Echo  Consultations:  none  Discharge Exam: Filed Vitals:   10/26/14 0631  BP: 133/82  Pulse: 92  Temp: 98.4 F (36.9 C)  Resp: 20    General: A&O x3 Cardiovascular: RRR Respiratory: good air movement CTA B/L  Discharge Instructions   Discharge Instructions  Diet - low sodium heart healthy    Complete by:  As directed      Increase activity slowly    Complete by:  As directed           Current Discharge Medication List    START taking these medications   Details  ferrous sulfate 325 (65 FE) MG EC tablet Take 1 tablet (325 mg total) by mouth 3 (three) times daily with meals. Qty: 90 tablet, Refills: 3     insulin aspart (NOVOLOG) 100 UNIT/ML FlexPen Inject 5 Units into the skin 3 (three) times daily with meals. Qty: 15 mL, Refills: 11    Insulin Detemir (LEVEMIR) 100 UNIT/ML Pen Inject 55 Units into the skin daily at 10 pm. Qty: 15 mL, Refills: 11      CONTINUE these medications which have CHANGED   Details  metoCLOPramide (REGLAN) 10 MG tablet Take 1 tablet (10 mg total) by mouth every 6 (six) hours as needed for nausea (nausea/headache). Qty: 20 tablet, Refills: 0      CONTINUE these medications which have NOT CHANGED   Details  divalproex (DEPAKOTE) 500 MG DR tablet Take 1 tablet (500 mg total) by mouth 2 times daily at 12 noon and 4 pm. Qty: 60 tablet, Refills: 0    gabapentin (NEURONTIN) 300 MG capsule Take 1 capsule (300 mg total) by mouth 3 (three) times daily. Qty: 90 capsule, Refills: 0    medroxyPROGESTERone (DEPO-PROVERA) 150 MG/ML injection Inject 150 mg into the muscle every 3 (three) months.    promethazine (PHENERGAN) 25 MG suppository Place 1 suppository (25 mg total) rectally every 6 (six) hours as needed for nausea or vomiting. Qty: 30 each, Refills: 0    risperiDONE (RISPERDAL) 2 MG tablet Take 2 mg by mouth at bedtime.    traMADol (ULTRAM) 50 MG tablet Take 1-2 tablets (50-100 mg total) by mouth every 6 (six) hours as needed for moderate pain. Qty: 60 tablet, Refills: 0      STOP taking these medications     insulin aspart protamine- aspart (NOVOLOG MIX 70/30) (70-30) 100 UNIT/ML injection        Allergies  Allergen Reactions  . Penicillins Rash   Follow-up Information    Follow up with [facility].   Why:  HHRN/Social worker   Contact information:   [address] [place] [address] [phone]       Follow up with [facility]     On 10/28/2014.   Why:  11:15a/bring in meds/photo id/$20 co pay/d/c papers.   Contact information:   [address] [place]  [address] [phone]       The results of significant diagnostics from this hospitalization (including imaging, microbiology, ancillary and laboratory) are listed below for reference.    Significant Diagnostic Studies: No results found.  Microbiology: No results found for this or any previous visit (from the past 240 hour(s)).   Labs: Basic Metabolic Panel:  Recent Labs Lab 10/24/14 0104 10/24/14 0542 10/24/14 0925 10/25/14 0455 10/26/14 0455  NA 140 138 137 134* 137  K 3.4* 3.5 3.4* 3.1* 4.3  CL 116* 110 106 103 100*  CO2 20* 19* 19* 24 24  GLUCOSE 180* 306* 338* 349* 315*  BUN 13 11 11 13 16   CREATININE 0.46 0.52 0.53 0.46 0.47  CALCIUM 8.8* 8.8* 9.1 8.9 9.3  MG 1.7  --   --   --   --  PHOS 1.9*  --   --   --   --    Liver Function Tests:  Recent Labs Lab 10/23/14 1010  AST 46*  ALT 49  ALKPHOS 86  BILITOT 1.6*  PROT 8.2*  ALBUMIN 4.5    Recent Labs Lab 10/23/14 1010  LIPASE 18*   No results for input(s): AMMONIA in the last 168 hours. CBC:  Recent Labs Lab 10/23/14 1010 10/24/14 0542 10/25/14 0455 10/26/14 0455  WBC 12.2* 11.4* 9.9 8.6  NEUTROABS 9.6*  --   --   --   HGB 13.8 11.1* 11.7* 11.5*  HCT 43.6 35.5* 35.9* 36.8  MCV 73.8* 74.0* 72.7* 72.9*  PLT 283 214 171 155   Cardiac Enzymes:  Recent Labs Lab 10/23/14 1010 10/24/14 0925 10/24/14 1553 10/24/14 2120  TROPONINI <0.03 <0.03 <0.03 <0.03   BNP: BNP (last 3 results) No results for input(s): BNP in the last 8760 hours.  ProBNP (last 3 results) No results for input(s): PROBNP in the last 8760 hours.  CBG:  Recent Labs Lab 10/25/14 0733 10/25/14 1120 10/25/14 1647 10/25/14 2208 10/26/14 0730  GLUCAP 338* 407* 196* 242* 325*       Signed:  FELIZ ORTIZ, Nayib Remer  [facility] 10/26/2014, 10:04 AM

## 2014-10-26 NOTE — Care Management Note (Signed)
Case Management Note  Patient Details  Name: Mary [name] MRN: [id number] Date of Birth: 12-12-1970  Subjective/Objective:                    Action/Plan: d/c home w/HHC,HRI.   Expected Discharge Date:                  Expected Discharge Plan:  [facility] (patient not eligble to [employer])  In-House Referral:     Discharge planning Services  CM Consult  Post Acute Care Choice:    Choice offered to:  Patient  DME Arranged:    DME Agency:     HH Arranged:  RN (HHRN/social worker-non compliance/transportation) [place] Agency:  [facility] (Patient also [facility] under [facility] not able to pick up patient since no insurance.)  Status of Service:  Completed, signed off  Medicare Important Message Given:    Date Medicare IM Given:    Medicare IM give by:    Date Additional Medicare IM Given:    Additional Medicare Important Message give by:     If discussed at [place] of Stay Meetings, dates discussed:    Additional Comments:  Dessa Phi, RN 10/26/2014, 12:12 PM

## 2014-10-27 ENCOUNTER — Ambulatory Visit: Payer: Self-pay | Attending: Internal Medicine

## 2014-10-28 ENCOUNTER — Inpatient Hospital Stay: Payer: Self-pay | Admitting: Internal Medicine

## 2014-11-04 ENCOUNTER — Telehealth: Payer: Self-pay | Admitting: *Deleted

## 2014-11-04 NOTE — Telephone Encounter (Signed)
Home health RN called to say patient's CBG this morning was 499.  Patient reports being non-compliant with her diet and ate a large plate of spaghetti last night.  Patient was recently discharged from [facility] for DKA where she was prescribed Levemir 55 units hs.  Home health RN said patient was not taking the Levemir and was still taking her Lantus which was an old Rx.  Patient has appointment with PCP on June 3rd.  RN to see patient again soon and will follow up on the Levemir and remind patient to attend appointment at clinic.  Patient urged to be compliant with diet and medication and go to ED if need is there.

## 2014-11-07 ENCOUNTER — Other Ambulatory Visit: Payer: Self-pay | Admitting: Internal Medicine

## 2014-11-09 ENCOUNTER — Emergency Department (HOSPITAL_COMMUNITY)
Admission: EM | Admit: 2014-11-09 | Discharge: 2014-11-09 | Disposition: A | Discharge status: Home / Self Care | Payer: Medicaid Other | Attending: Emergency Medicine | Admitting: Emergency Medicine

## 2014-11-09 ENCOUNTER — Encounter (HOSPITAL_COMMUNITY): Payer: Self-pay | Admitting: *Deleted

## 2014-11-09 DIAGNOSIS — G629 Polyneuropathy, unspecified: Secondary | ICD-10-CM | POA: Diagnosis not present

## 2014-11-09 DIAGNOSIS — E1065 Type 1 diabetes mellitus with hyperglycemia: Secondary | ICD-10-CM | POA: Diagnosis present

## 2014-11-09 DIAGNOSIS — R011 Cardiac murmur, unspecified: Secondary | ICD-10-CM | POA: Diagnosis not present

## 2014-11-09 DIAGNOSIS — K3184 Gastroparesis: Secondary | ICD-10-CM | POA: Insufficient documentation

## 2014-11-09 DIAGNOSIS — I10 Essential (primary) hypertension: Secondary | ICD-10-CM | POA: Insufficient documentation

## 2014-11-09 DIAGNOSIS — Z87442 Personal history of urinary calculi: Secondary | ICD-10-CM | POA: Insufficient documentation

## 2014-11-09 DIAGNOSIS — Z720 Tobacco use: Secondary | ICD-10-CM | POA: Insufficient documentation

## 2014-11-09 DIAGNOSIS — M419 Scoliosis, unspecified: Secondary | ICD-10-CM | POA: Insufficient documentation

## 2014-11-09 DIAGNOSIS — F329 Major depressive disorder, single episode, unspecified: Secondary | ICD-10-CM | POA: Diagnosis not present

## 2014-11-09 DIAGNOSIS — G40909 Epilepsy, unspecified, not intractable, without status epilepticus: Secondary | ICD-10-CM | POA: Diagnosis not present

## 2014-11-09 DIAGNOSIS — Z880 Allergy status to penicillin: Secondary | ICD-10-CM | POA: Diagnosis not present

## 2014-11-09 DIAGNOSIS — Z8619 Personal history of other infectious and parasitic diseases: Secondary | ICD-10-CM | POA: Diagnosis not present

## 2014-11-09 DIAGNOSIS — Z79899 Other long term (current) drug therapy: Secondary | ICD-10-CM | POA: Insufficient documentation

## 2014-11-09 DIAGNOSIS — E1010 Type 1 diabetes mellitus with ketoacidosis without coma: Secondary | ICD-10-CM | POA: Diagnosis not present

## 2014-11-09 DIAGNOSIS — Z794 Long term (current) use of insulin: Secondary | ICD-10-CM | POA: Insufficient documentation

## 2014-11-09 DIAGNOSIS — D649 Anemia, unspecified: Secondary | ICD-10-CM | POA: Diagnosis not present

## 2014-11-09 DIAGNOSIS — H269 Unspecified cataract: Secondary | ICD-10-CM | POA: Diagnosis not present

## 2014-11-09 LAB — CBG MONITORING, ED
GLUCOSE-CAPILLARY: 210 mg/dL — AB (ref 65–99)
Glucose-Capillary: 290 mg/dL — ABNORMAL HIGH (ref 65–99)

## 2014-11-09 LAB — CBC WITH DIFFERENTIAL/PLATELET
BASOS ABS: 0 10*3/uL (ref 0.0–0.1)
Basophils Relative: 0 % (ref 0–1)
EOS PCT: 0 % (ref 0–5)
Eosinophils Absolute: 0 10*3/uL (ref 0.0–0.7)
HCT: 37.1 % (ref 36.0–46.0)
HEMOGLOBIN: 11.9 g/dL — AB (ref 12.0–15.0)
LYMPHS ABS: 1.5 10*3/uL (ref 0.7–4.0)
Lymphocytes Relative: 15 % (ref 12–46)
MCH: 23.5 pg — AB (ref 26.0–34.0)
MCHC: 32.1 g/dL (ref 30.0–36.0)
MCV: 73.2 fL — ABNORMAL LOW (ref 78.0–100.0)
MONO ABS: 0.2 10*3/uL (ref 0.1–1.0)
Monocytes Relative: 2 % — ABNORMAL LOW (ref 3–12)
NEUTROS PCT: 84 % — AB (ref 43–77)
Neutro Abs: 8.8 10*3/uL — ABNORMAL HIGH (ref 1.7–7.7)
Platelets: 137 10*3/uL — ABNORMAL LOW (ref 150–400)
RBC: 5.07 MIL/uL (ref 3.87–5.11)
RDW: 15.4 % (ref 11.5–15.5)
WBC: 10.5 10*3/uL (ref 4.0–10.5)

## 2014-11-09 LAB — BASIC METABOLIC PANEL
Anion gap: 14 (ref 5–15)
Anion gap: 13 (ref 5–15)
BUN: 11 mg/dL (ref 6–20)
BUN: 10 mg/dL (ref 6–20)
CALCIUM: 8.9 mg/dL (ref 8.9–10.3)
CALCIUM: 8.7 mg/dL — AB (ref 8.9–10.3)
CO2: 18 mmol/L — ABNORMAL LOW (ref 22–32)
CO2: 18 mmol/L — ABNORMAL LOW (ref 22–32)
Chloride: 108 mmol/L (ref 101–111)
Chloride: 109 mmol/L (ref 101–111)
Creatinine, Ser: 0.64 mg/dL (ref 0.44–1.00)
Creatinine, Ser: 0.47 mg/dL (ref 0.44–1.00)
GFR calc Af Amer: >60 mL/min (ref >60)
GLUCOSE: 307 mg/dL — AB (ref 65–99)
GLUCOSE: 248 mg/dL — AB (ref 65–99)
POTASSIUM: 4 mmol/L (ref 3.5–5.1)
POTASSIUM: 4.6 mmol/L (ref 3.5–5.1)
Sodium: 140 mmol/L (ref 135–145)
Sodium: 140 mmol/L (ref 135–145)

## 2014-11-09 MED ORDER — SODIUM CHLORIDE 0.9 % IV BOLUS (SEPSIS)
1000.0000 mL | Freq: Once | INTRAVENOUS | Status: AC
  Administered 2014-11-09: 1000 mL via INTRAVENOUS

## 2014-11-09 MED ORDER — MORPHINE SULFATE 4 MG/ML IJ SOLN
4.0000 mg | Freq: Once | INTRAMUSCULAR | Status: AC
  Administered 2014-11-09: 4 mg via INTRAVENOUS
  Filled 2014-11-09: qty 1

## 2014-11-09 MED ORDER — METOCLOPRAMIDE HCL 5 MG/ML IJ SOLN
10.0000 mg | Freq: Once | INTRAMUSCULAR | Status: AC
  Administered 2014-11-09: 10 mg via INTRAVENOUS
  Filled 2014-11-09: qty 2

## 2014-11-09 MED ORDER — METOCLOPRAMIDE HCL 10 MG PO TABS: 10.0000 mg | ORAL_TABLET | Freq: Four times a day (QID) | ORAL | Status: DC | PRN

## 2014-11-09 NOTE — ED Provider Notes (Signed)
CSN: [id number]     Arrival date & time 11/09/14  1434 History   First MD Initiated Contact with Patient 11/09/14 1507     Chief Complaint  Patient presents with  . Hyperglycemia     (Consider location/radiation/quality/duration/timing/severity/associated sxs/prior Treatment) HPI Comments: Pt with a hx of DM, gastroparesis, DKA, presents with elevated blood sugar and abd pain.  She reports a 2 day hx of worsening crampy upper abd pain, similar to her past episodes of gastroparesis.  Is associated with n/v which is non-bloody and non-bilious.  Denies fever.  No change in bowel habits.  Says that she normally takes reglan at home, but is out of her meds.  Goes to [place].  No other recent illnesses.  Patient is a 44 y.o. female presenting with hyperglycemia.  Hyperglycemia Associated symptoms: abdominal pain, fatigue, nausea and vomiting   Associated symptoms: no chest pain, no diaphoresis, no dizziness, no fever, no shortness of breath and no weakness     Past Medical History  Diagnosis Date  . Kidney stone   . Seizure disorder     started with pregnancy of first son  . Gastritis   . Bipolar 2 disorder   . Post traumatic stress disorder     "flipping out" after people close to her died  . Heart murmur   . Arthritis     r ankle-S/P surgery (2007)  . Anemia     childhood  . Depression     childhood  . Hepatitis C     biopsy in 2010-no rx-was supposed to see a hepatologist in [place].  With cirrhosis  . Neuropathy     in legs and feet and hands  . Cataracts, bilateral   . Scoliosis   . Diabetes mellitus     diagnosed in 1996-always been on insulin  . DKA (diabetic ketoacidoses)     Recurrent admissions for DKA, medication non-complaince, poor social situation  . Diabetic neuropathy, type I diabetes mellitus     numbness bilaterally feet  . HTN (hypertension) 10/12/2013   Past Surgical History  Procedure Laterality Date  . Ankle surgery  2007   . Endometrial biopsy  06/22/2012  . Cholecystectomy  04/07/2013  . Cholecystectomy N/A 04/07/2013    Procedure: LAPAROSCOPIC CHOLECYSTECTOMY WITH INTRAOPERATIVE CHOLANGIOGRAM;  Surgeon: Adin Hector, MD;  Location: [facility];  Service: General;  Laterality: N/A;  . Cesarean section      3 times   Family History  Problem Relation Age of Onset  . Diabetes Mother     currently 49  . Fibromyalgia Mother   . Cirrhosis Father     died in 44  . Diabetes Maternal Grandmother   . Diabetes Maternal Aunt    History  Substance Use Topics  . Smoking status: Current Every Day Smoker -- 0.10 packs/day for 25 years    Types: Cigarettes  . Smokeless tobacco: Never Used  . Alcohol Use: No     Comment: Endorses hasn't been drinking   OB History    Gravida Para Term Preterm AB TAB SAB Ectopic Multiple Living   4 3 3  0 1 0 1 0 1 2     Review of Systems  Constitutional: Positive for fatigue. Negative for fever, chills and diaphoresis.  HENT: Negative for congestion, rhinorrhea and sneezing.   Eyes: Negative.   Respiratory: Negative for cough, chest tightness and shortness of breath.   Cardiovascular: Negative for chest pain and leg swelling.  Gastrointestinal:  Positive for nausea, vomiting and abdominal pain. Negative for diarrhea and blood in stool.  Genitourinary: Negative for frequency, hematuria, flank pain and difficulty urinating.  Musculoskeletal: Negative for back pain and arthralgias.  Skin: Negative for rash.  Neurological: Negative for dizziness, speech difficulty, weakness, numbness and headaches.      Allergies  Penicillins  Home Medications   Prior to Admission medications   Medication Sig Start Date End Date Taking? Authorizing Provider  divalproex (DEPAKOTE) 500 MG DR tablet Take 1 tablet (500 mg total) by mouth 2 times daily at 12 noon and 4 pm. 08/17/14  Yes Orson Eva, MD  ferrous sulfate 325 (65 FE) MG EC tablet Take 1 tablet (325 mg total) by mouth 3 (three) times  daily with meals. 10/26/14  Yes Charlynne Cousins, MD  gabapentin (NEURONTIN) 300 MG capsule Take 1 capsule (300 mg total) by mouth 3 (three) times daily. 10/12/14  Yes Debbe Odea, MD  insulin aspart (NOVOLOG) 100 UNIT/ML FlexPen Inject 5 Units into the skin 3 (three) times daily with meals. 10/26/14  Yes Charlynne Cousins, MD  Insulin Detemir (LEVEMIR) 100 UNIT/ML Pen Inject 55 Units into the skin daily at 10 pm. 10/26/14  Yes Charlynne Cousins, MD  medroxyPROGESTERone (DEPO-PROVERA) 150 MG/ML injection Inject 150 mg into the muscle every 3 (three) months.   Yes Historical Provider, MD  promethazine (PHENERGAN) 25 MG suppository Place 1 suppository (25 mg total) rectally every 6 (six) hours as needed for nausea or vomiting. 10/12/14  Yes Debbe Odea, MD  risperiDONE (RISPERDAL) 2 MG tablet Take 2 mg by mouth at bedtime.   Yes Historical Provider, MD  metoCLOPramide (REGLAN) 10 MG tablet Take 1 tablet (10 mg total) by mouth every 6 (six) hours as needed for nausea (nausea/headache). 11/09/14   Malvin Johns, MD  traMADol (ULTRAM) 50 MG tablet Take 1-2 tablets (50-100 mg total) by mouth every 6 (six) hours as needed for moderate pain. Patient not taking: Reported on 11/09/2014 10/12/14   Debbe Odea, MD   BP 140/80 mmHg  Pulse 78  Temp(Src) 97.8 F (36.6 C) (Oral)  Resp 16  SpO2 98% Physical Exam  Constitutional: She is oriented to person, place, and time. She appears well-developed and well-nourished.  Is laying quietly on her side on the bed until I enter the room, at which time she starts moaning and writhing around on the bed, holding her abdomen  HENT:  Head: Normocephalic and atraumatic.  Eyes: Pupils are equal, round, and reactive to light.  Neck: Normal range of motion. Neck supple.  Cardiovascular: Normal rate, regular rhythm and normal heart sounds.   Pulmonary/Chest: Effort normal and breath sounds normal. No respiratory distress. She has no wheezes. She has no rales. She  exhibits no tenderness.  Abdominal: Soft. Bowel sounds are normal. There is tenderness (moderate TTP to epigastrium). There is no rebound and no guarding.  Musculoskeletal: Normal range of motion. She exhibits no edema.  Lymphadenopathy:    She has no cervical adenopathy.  Neurological: She is alert and oriented to person, place, and time.  Skin: Skin is warm and dry. No rash noted.  Psychiatric: She has a normal mood and affect.    ED Course  Procedures (including critical care time) Labs Review Labs Reviewed  BASIC METABOLIC PANEL - Abnormal; Notable for the following:    CO2 18 (*)    Glucose, Bld 307 (*)    All other components within normal limits  CBC WITH DIFFERENTIAL/PLATELET - Abnormal; Notable  for the following:    Hemoglobin 11.9 (*)    MCV 73.2 (*)    MCH 23.5 (*)    Platelets 137 (*)    Neutrophils Relative % 84 (*)    Neutro Abs 8.8 (*)    Monocytes Relative 2 (*)    All other components within normal limits  BASIC METABOLIC PANEL - Abnormal; Notable for the following:    CO2 18 (*)    Glucose, Bld 248 (*)    Calcium 8.7 (*)    All other components within normal limits  CBG MONITORING, ED - Abnormal; Notable for the following:    Glucose-Capillary 290 (*)    All other components within normal limits  CBG MONITORING, ED - Abnormal; Notable for the following:    Glucose-Capillary 210 (*)    All other components within normal limits  CBG MONITORING, ED    Imaging Review No results found.   EKG Interpretation None      MDM   Final diagnoses:  Gastroparesis    Patient history of diabetes which is now well controlled and gastroparesis presents with vomiting and abdominal pain. Her symptoms are consistent with her past episodes of gastroparesis. She was given a total of 3 L IV fluids in the ED as well as Reglan and pain medicine. She's feeling 100% better. She's not complaining of any ongoing abdominal pain. She has no ongoing vomiting. Her bicarbonate is  slightly low at 18. However she has no anion gap and she looks much better from her initial presentation. She's tolerating by mouth fluids and feels like she is ready to go home. Her blood sugar has improved to 210 after fluids. She was discharged home in good condition and advised to follow-up with the [facility] or return here as needed for any worsening symptoms.    Malvin Johns, MD 11/09/14 [id number]

## 2014-11-09 NOTE — Progress Notes (Addendum)
EDCM noted patient to be admitted 5 times in the las six months with 10 visits to the ED.  Patient is now a High Risk Initiative ([place]) patient with [facility].  [facility] RN has made a home visit on 05/20 per chart review.  EDCM spoke to patient at bedside.  Patient confirms her pcp is Dr. Feliciana Rossetti at the [facility].  EDCM asked patient if she is watching her diet at home?  Patient stated, "I'm trying."  [facility] asked patient why she has made so many visits to the ED, is there anything going on at home?  Patient stated, "I got a lot going on, I ain't about to get into it.  I ain't got no job, no insurnace."  EDCM informed patient that she will not have her health much longer if she continues to be noncompliant with her diet and medications.  [facility] informed patient that she is receiving home health services and is able to receive assistance with her medications from the [facility].  [facility] informed patient that we have been offering her assistance because we want her to be well and have a good quality of life.  [facility] asked patient if she has her medications at home?  Patient reports, "I have some."  EDCM asked patient if she would be able to go to the [facility] tomorrow to get her medications?  Patient responded, "If I'm feeling up to it.  Sometimes when I get out of here, I still feel bad."  Patient reports she doesn't have anyone to bring her or pick up her medications from [facility]. Patient gave [facility] permission to follow up with [facility] regarding her medications.  Offered support to patient.  No further EDCM needs at this time.  11/09/2014 A. Biana Haggar [facility] 2221pm [facility] sent email to CM at [facility] regarding medication issues and inquiring if patient has a Education officer, museum.

## 2014-11-09 NOTE — ED Notes (Signed)
Per EMS pt coming from home with hyperglycemia and abdominal pain, was recently d/c'd from hospital for same, CBG 330. Pt was given about 350 ml NS and 4 mg zofran IV en route.

## 2014-11-09 NOTE — Discharge Instructions (Signed)

## 2014-11-09 NOTE — ED Notes (Signed)
Bed: EB34 Expected date:  Expected time:  Means of arrival:  Comments: Ems-hyperglycemia

## 2014-11-10 ENCOUNTER — Telehealth: Payer: Self-pay

## 2014-11-10 NOTE — Telephone Encounter (Signed)
**Note Mary-Identified via Obfuscation** Received communication from Mary [name], ED Case Manager regarding patient needing close post-ED follow-up. Patient has had 5 admissions and 9 ED visits in the last 6 months for diabetic related complications.  Questionable medication compliance, lack of insurance. PCP Mary [name]. Appointment with PCP scheduled on 11/18/14; call placed to patient to discuss Mary [name], to discuss meeting with Financial Counselor, Social Worker, and to determine if patient has all needed medications.  Unable to reach patient or leave voicemail at listed number.  Placed call to patient's listed emergency contact, Mary [name] (mother); left voicemail requesting patient return call. Will attempt to reach patient again at a later time.

## 2014-11-11 ENCOUNTER — Telehealth: Payer: Self-pay

## 2014-11-11 NOTE — Telephone Encounter (Signed)
This Care Manager attempted to contact patient at # [phone]. There was no answer. The message noted that the person was unavailable, no option to leave a voice mail message. This patient has an appointment scheduled at the [facility] on 11/18/14 at 10:30am with [name], NP.

## 2014-11-14 ENCOUNTER — Encounter (HOSPITAL_COMMUNITY): Payer: Self-pay | Admitting: Oncology

## 2014-11-14 ENCOUNTER — Emergency Department (HOSPITAL_COMMUNITY)
Admission: EM | Admit: 2014-11-14 | Discharge: 2014-11-14 | Disposition: A | Discharge status: Home / Self Care | Payer: Medicaid Other | Attending: Emergency Medicine | Admitting: Emergency Medicine

## 2014-11-14 DIAGNOSIS — D649 Anemia, unspecified: Secondary | ICD-10-CM | POA: Diagnosis not present

## 2014-11-14 DIAGNOSIS — R011 Cardiac murmur, unspecified: Secondary | ICD-10-CM | POA: Insufficient documentation

## 2014-11-14 DIAGNOSIS — H269 Unspecified cataract: Secondary | ICD-10-CM | POA: Insufficient documentation

## 2014-11-14 DIAGNOSIS — Z79899 Other long term (current) drug therapy: Secondary | ICD-10-CM | POA: Diagnosis not present

## 2014-11-14 DIAGNOSIS — E1043 Type 1 diabetes mellitus with diabetic autonomic (poly)neuropathy: Secondary | ICD-10-CM | POA: Diagnosis not present

## 2014-11-14 DIAGNOSIS — E1040 Type 1 diabetes mellitus with diabetic neuropathy, unspecified: Secondary | ICD-10-CM | POA: Insufficient documentation

## 2014-11-14 DIAGNOSIS — Z720 Tobacco use: Secondary | ICD-10-CM | POA: Diagnosis not present

## 2014-11-14 DIAGNOSIS — E1065 Type 1 diabetes mellitus with hyperglycemia: Secondary | ICD-10-CM | POA: Diagnosis not present

## 2014-11-14 DIAGNOSIS — Z87442 Personal history of urinary calculi: Secondary | ICD-10-CM | POA: Diagnosis not present

## 2014-11-14 DIAGNOSIS — Z8739 Personal history of other diseases of the musculoskeletal system and connective tissue: Secondary | ICD-10-CM | POA: Diagnosis not present

## 2014-11-14 DIAGNOSIS — R112 Nausea with vomiting, unspecified: Secondary | ICD-10-CM

## 2014-11-14 DIAGNOSIS — I10 Essential (primary) hypertension: Secondary | ICD-10-CM | POA: Insufficient documentation

## 2014-11-14 DIAGNOSIS — R1084 Generalized abdominal pain: Secondary | ICD-10-CM | POA: Diagnosis present

## 2014-11-14 DIAGNOSIS — Z880 Allergy status to penicillin: Secondary | ICD-10-CM | POA: Insufficient documentation

## 2014-11-14 DIAGNOSIS — R739 Hyperglycemia, unspecified: Secondary | ICD-10-CM

## 2014-11-14 DIAGNOSIS — Z8639 Personal history of other endocrine, nutritional and metabolic disease: Secondary | ICD-10-CM

## 2014-11-14 DIAGNOSIS — Z794 Long term (current) use of insulin: Secondary | ICD-10-CM | POA: Diagnosis not present

## 2014-11-14 DIAGNOSIS — F3181 Bipolar II disorder: Secondary | ICD-10-CM | POA: Diagnosis not present

## 2014-11-14 DIAGNOSIS — Z8619 Personal history of other infectious and parasitic diseases: Secondary | ICD-10-CM | POA: Insufficient documentation

## 2014-11-14 DIAGNOSIS — F4310 Post-traumatic stress disorder, unspecified: Secondary | ICD-10-CM | POA: Insufficient documentation

## 2014-11-14 LAB — COMPREHENSIVE METABOLIC PANEL
ALT: 39 U/L (ref 14–54)
AST: 44 U/L — AB (ref 15–41)
Albumin: 4.5 g/dL (ref 3.5–5.0)
Alkaline Phosphatase: 90 U/L (ref 38–126)
Anion gap: 13 (ref 5–15)
BILIRUBIN TOTAL: 0.9 mg/dL (ref 0.3–1.2)
BUN: 13 mg/dL (ref 6–20)
CALCIUM: 10 mg/dL (ref 8.9–10.3)
CO2: 24 mmol/L (ref 22–32)
CREATININE: 0.82 mg/dL (ref 0.44–1.00)
Chloride: 99 mmol/L — ABNORMAL LOW (ref 101–111)
GFR calc Af Amer: >60 mL/min (ref >60)
Glucose, Bld: 561 mg/dL (ref 65–99)
Potassium: 4.8 mmol/L (ref 3.5–5.1)
SODIUM: 136 mmol/L (ref 135–145)
Total Protein: 8.4 g/dL — ABNORMAL HIGH (ref 6.5–8.1)

## 2014-11-14 LAB — CBC WITH DIFFERENTIAL/PLATELET
Basophils Absolute: 0 10*3/uL (ref 0.0–0.1)
Basophils Relative: 0 % (ref 0–1)
Eosinophils Absolute: 0.2 10*3/uL (ref 0.0–0.7)
Eosinophils Relative: 2 % (ref 0–5)
HCT: 44.5 % (ref 36.0–46.0)
HEMOGLOBIN: 14.4 g/dL (ref 12.0–15.0)
LYMPHS ABS: 3.9 10*3/uL (ref 0.7–4.0)
Lymphocytes Relative: 33 % (ref 12–46)
MCH: 23.6 pg — AB (ref 26.0–34.0)
MCHC: 32.4 g/dL (ref 30.0–36.0)
MCV: 73.1 fL — ABNORMAL LOW (ref 78.0–100.0)
MONO ABS: 0.5 10*3/uL (ref 0.1–1.0)
Monocytes Relative: 4 % (ref 3–12)
NEUTROS ABS: 7 10*3/uL (ref 1.7–7.7)
Neutrophils Relative %: 60 % (ref 43–77)
Platelets: 155 10*3/uL (ref 150–400)
RBC: 6.09 MIL/uL — ABNORMAL HIGH (ref 3.87–5.11)
RDW: 15.3 % (ref 11.5–15.5)
WBC: 11.6 10*3/uL — ABNORMAL HIGH (ref 4.0–10.5)

## 2014-11-14 LAB — URINALYSIS, ROUTINE W REFLEX MICROSCOPIC
BILIRUBIN URINE: NEGATIVE
Glucose, UA: >1000 mg/dL — AB
Hgb urine dipstick: NEGATIVE
Ketones, ur: 15 mg/dL — AB
Leukocytes, UA: NEGATIVE
Nitrite: NEGATIVE
Protein, ur: NEGATIVE mg/dL
Specific Gravity, Urine: 1.031 — ABNORMAL HIGH (ref 1.005–1.030)
Urobilinogen, UA: 0.2 mg/dL (ref 0.0–1.0)
pH: 7.5 (ref 5.0–8.0)

## 2014-11-14 LAB — RAPID URINE DRUG SCREEN, HOSP PERFORMED
Amphetamines: NOT DETECTED
BARBITURATES: NOT DETECTED
BENZODIAZEPINES: NOT DETECTED
Cocaine: NOT DETECTED
OPIATES: NOT DETECTED
TETRAHYDROCANNABINOL: POSITIVE — AB

## 2014-11-14 LAB — URINE MICROSCOPIC-ADD ON

## 2014-11-14 LAB — LIPASE, BLOOD: Lipase: 29 U/L (ref 22–51)

## 2014-11-14 LAB — CBG MONITORING, ED
Glucose-Capillary: 317 mg/dL — ABNORMAL HIGH (ref 65–99)
Glucose-Capillary: 343 mg/dL — ABNORMAL HIGH (ref 65–99)

## 2014-11-14 MED ORDER — INSULIN ASPART 100 UNIT/ML IV SOLN
10.0000 [IU] | Freq: Once | INTRAVENOUS | Status: AC
  Administered 2014-11-14: 10 [IU] via INTRAVENOUS
  Filled 2014-11-14: qty 0.1

## 2014-11-14 MED ORDER — SODIUM CHLORIDE 0.9 % IV BOLUS (SEPSIS)
1000.0000 mL | Freq: Once | INTRAVENOUS | Status: AC
  Administered 2014-11-14: 1000 mL via INTRAVENOUS

## 2014-11-14 MED ORDER — ONDANSETRON HCL 4 MG/2ML IJ SOLN
4.0000 mg | Freq: Once | INTRAMUSCULAR | Status: AC
  Administered 2014-11-14: 4 mg via INTRAVENOUS
  Filled 2014-11-14: qty 2

## 2014-11-14 MED ORDER — HYDROMORPHONE HCL 1 MG/ML IJ SOLN
1.0000 mg | Freq: Once | INTRAMUSCULAR | Status: AC
  Administered 2014-11-14: 1 mg via INTRAVENOUS
  Filled 2014-11-14: qty 1

## 2014-11-14 MED ORDER — PROMETHAZINE HCL 25 MG PO TABS: 25.0000 mg | ORAL_TABLET | Freq: Four times a day (QID) | ORAL | Status: DC | PRN

## 2014-11-14 MED ORDER — METOCLOPRAMIDE HCL 5 MG/ML IJ SOLN
10.0000 mg | Freq: Once | INTRAMUSCULAR | Status: AC
  Administered 2014-11-14: 10 mg via INTRAVENOUS
  Filled 2014-11-14: qty 2

## 2014-11-14 NOTE — ED Notes (Signed)
IV taken out.

## 2014-11-14 NOTE — Discharge Instructions (Signed)
Phenergan as prescribed as needed for nausea.  Clear liquid diet for the next 24 hours, then advance to normal.  Follow-up with your primary Dr. in the next few days.   Nausea and Vomiting Nausea is a sick feeling that often comes before throwing up (vomiting). Vomiting is a reflex where stomach contents come out of your mouth. Vomiting can cause severe loss of body fluids (dehydration). Children and elderly adults can become dehydrated quickly, especially if they also have diarrhea. Nausea and vomiting are symptoms of a condition or disease. It is important to find the cause of your symptoms. CAUSES   Direct irritation of the stomach lining. This irritation can result from increased acid production (gastroesophageal reflux disease), infection, food poisoning, taking certain medicines (such as nonsteroidal anti-inflammatory drugs), alcohol use, or tobacco use.  Signals from the brain.These signals could be caused by a headache, heat exposure, an inner ear disturbance, increased pressure in the brain from injury, infection, a tumor, or a concussion, pain, emotional stimulus, or metabolic problems.  An obstruction in the gastrointestinal tract (bowel obstruction).  Illnesses such as diabetes, hepatitis, gallbladder problems, appendicitis, kidney problems, cancer, sepsis, atypical symptoms of a heart attack, or eating disorders.  Medical treatments such as chemotherapy and radiation.  Receiving medicine that makes you sleep (general anesthetic) during surgery. DIAGNOSIS Your caregiver may ask for tests to be done if the problems do not improve after a few days. Tests may also be done if symptoms are severe or if the reason for the nausea and vomiting is not clear. Tests may include:  Urine tests.  Blood tests.  Stool tests.  Cultures (to look for evidence of infection).  X-rays or other imaging studies. Test results can help your caregiver make decisions about treatment or the need  for additional tests. TREATMENT You need to stay well hydrated. Drink frequently but in small amounts.You may wish to drink water, sports drinks, clear broth, or eat frozen ice pops or gelatin dessert to help stay hydrated.When you eat, eating slowly may help prevent nausea.There are also some antinausea medicines that may help prevent nausea. HOME CARE INSTRUCTIONS   Take all medicine as directed by your caregiver.  If you do not have an appetite, do not force yourself to eat. However, you must continue to drink fluids.  If you have an appetite, eat a normal diet unless your caregiver tells you differently.  Eat a variety of complex carbohydrates (rice, wheat, potatoes, bread), lean meats, yogurt, fruits, and vegetables.  Avoid high-fat foods because they are more difficult to digest.  Drink enough water and fluids to keep your urine clear or pale yellow.  If you are dehydrated, ask your caregiver for specific rehydration instructions. Signs of dehydration may include:  Severe thirst.  Dry lips and mouth.  Dizziness.  Dark urine.  Decreasing urine frequency and amount.  Confusion.  Rapid breathing or pulse. SEEK IMMEDIATE MEDICAL CARE IF:   You have blood or brown flecks (like coffee grounds) in your vomit.  You have black or bloody stools.  You have a severe headache or stiff neck.  You are confused.  You have severe abdominal pain.  You have chest pain or trouble breathing.  You do not urinate at least once every 8 hours.  You develop cold or clammy skin.  You continue to vomit for longer than 24 to 48 hours.  You have a fever. MAKE SURE YOU:   Understand these instructions.  Will watch your condition.  Will  get help right away if you are not doing well or get worse. Document Released: 06/03/2005 Document Revised: 08/26/2011 Document Reviewed: 10/31/2010 [facility] Patient Information 2015 [place], [place]. This information is not intended to replace  advice given to you by your health care provider. Make sure you discuss any questions you have with your health care provider.  Hyperglycemia Hyperglycemia occurs when the glucose (sugar) in your blood is too high. Hyperglycemia can happen for many reasons, but it most often happens to people who do not know they have diabetes or are not managing their diabetes properly.  CAUSES  Whether you have diabetes or not, there are other causes of hyperglycemia. Hyperglycemia can occur when you have diabetes, but it can also occur in other situations that you might not be as aware of, such as: Diabetes  If you have diabetes and are having problems controlling your blood glucose, hyperglycemia could occur because of some of the following reasons:  Not following your meal plan.  Not taking your diabetes medications or not taking it properly.  Exercising less or doing less activity than you normally do.  Being sick. Pre-diabetes  This cannot be ignored. Before people develop Type 2 diabetes, they almost always have "pre-diabetes." This is when your blood glucose levels are higher than normal, but not yet high enough to be diagnosed as diabetes. Research has shown that some long-term damage to the body, especially the heart and circulatory system, may already be occurring during pre-diabetes. If you take action to manage your blood glucose when you have pre-diabetes, you may delay or prevent Type 2 diabetes from developing. Stress  If you have diabetes, you may be "diet" controlled or on oral medications or insulin to control your diabetes. However, you may find that your blood glucose is higher than usual in the hospital whether you have diabetes or not. This is often referred to as "stress hyperglycemia." Stress can elevate your blood glucose. This happens because of hormones put out by the body during times of stress. If stress has been the cause of your high blood glucose, it can be followed regularly  by your caregiver. That way he/she can make sure your hyperglycemia does not continue to get worse or progress to diabetes. Steroids  Steroids are medications that act on the infection fighting system (immune system) to block inflammation or infection. One side effect can be a rise in blood glucose. Most people can produce enough extra insulin to allow for this rise, but for those who cannot, steroids make blood glucose levels go even higher. It is not unusual for steroid treatments to "uncover" diabetes that is developing. It is not always possible to determine if the hyperglycemia will go away after the steroids are stopped. A special blood test called an A1c is sometimes done to determine if your blood glucose was elevated before the steroids were started. SYMPTOMS  Thirsty.  Frequent urination.  Dry mouth.  Blurred vision.  Tired or fatigue.  Weakness.  Sleepy.  Tingling in feet or leg. DIAGNOSIS  Diagnosis is made by monitoring blood glucose in one or all of the following ways:  A1c test. This is a chemical found in your blood.  Fingerstick blood glucose monitoring.  Laboratory results. TREATMENT  First, knowing the cause of the hyperglycemia is important before the hyperglycemia can be treated. Treatment may include, but is not be limited to:  Education.  Change or adjustment in medications.  Change or adjustment in meal plan.  Treatment for an  illness, infection, etc.  More frequent blood glucose monitoring.  Change in exercise plan.  Decreasing or stopping steroids.  Lifestyle changes. HOME CARE INSTRUCTIONS   Test your blood glucose as directed.  Exercise regularly. Your caregiver will give you instructions about exercise. Pre-diabetes or diabetes which comes on with stress is helped by exercising.  Eat wholesome, balanced meals. Eat often and at regular, fixed times. Your caregiver or nutritionist will give you a meal plan to guide your sugar  intake.  Being at an ideal weight is important. If needed, losing as little as 10 to 15 pounds may help improve blood glucose levels. SEEK MEDICAL CARE IF:   You have questions about medicine, activity, or diet.  You continue to have symptoms (problems such as increased thirst, urination, or weight gain). SEEK IMMEDIATE MEDICAL CARE IF:   You are vomiting or have diarrhea.  Your breath smells fruity.  You are breathing faster or slower.  You are very sleepy or incoherent.  You have numbness, tingling, or pain in your feet or hands.  You have chest pain.  Your symptoms get worse even though you have been following your caregiver's orders.  If you have any other questions or concerns. Document Released: 11/27/2000 Document Revised: 08/26/2011 Document Reviewed: 09/30/2011 [facility] Patient Information 2015 [place], [place]. This information is not intended to replace advice given to you by your health care provider. Make sure you discuss any questions you have with your health care provider.

## 2014-11-14 NOTE — ED Provider Notes (Signed)
CSN: [id number]     Arrival date & time 11/14/14  2878 History   First MD Initiated Contact with Patient 11/14/14 [id number]     Chief Complaint  Patient presents with  . Abdominal Pain     (Consider location/radiation/quality/duration/timing/severity/associated sxs/prior Treatment) HPI Comments: Patient is a 44 year old female with history of bipolar, noncompliant diabetes, gastroparesis, and substance abuse. She presents for evaluation of severe abdominal pain, nausea, vomiting that started several hours prior to arrival. She has a history of frequent ER visits and admissions for DKA and gastroparesis. She was most recently admitted, then discharged last week. This is her fourth visit to the ER this month with similar presentation. She is somewhat difficult to get a history from as she is writhing all over the stretcher and moaning loudly.  Patient is a 44 y.o. female presenting with abdominal pain. The history is provided by the patient.  Abdominal Pain Pain location:  Generalized Pain quality: stabbing   Pain radiates to:  Does not radiate Pain severity:  Severe Onset quality:  Sudden Duration:  2 hours Timing:  Constant Progression:  Worsening Chronicity:  Recurrent Relieved by:  Nothing Worsened by:  Nothing tried Associated symptoms: nausea and vomiting   Associated symptoms: no chills, no cough, no diarrhea and no fever     Past Medical History  Diagnosis Date  . Kidney stone   . Seizure disorder     started with pregnancy of first son  . Gastritis   . Bipolar 2 disorder   . Post traumatic stress disorder     "flipping out" after people close to her died  . Heart murmur   . Arthritis     r ankle-S/P surgery (2007)  . Anemia     childhood  . Depression     childhood  . Hepatitis C     biopsy in 2010-no rx-was supposed to see a hepatologist in [place].  With cirrhosis  . Neuropathy     in legs and feet and hands  . Cataracts, bilateral   . Scoliosis   .  Diabetes mellitus     diagnosed in 1996-always been on insulin  . DKA (diabetic ketoacidoses)     Recurrent admissions for DKA, medication non-complaince, poor social situation  . Diabetic neuropathy, type I diabetes mellitus     numbness bilaterally feet  . HTN (hypertension) 10/12/2013   Past Surgical History  Procedure Laterality Date  . Ankle surgery  2007  . Endometrial biopsy  06/22/2012  . Cholecystectomy  04/07/2013  . Cholecystectomy N/A 04/07/2013    Procedure: LAPAROSCOPIC CHOLECYSTECTOMY WITH INTRAOPERATIVE CHOLANGIOGRAM;  Surgeon: Adin Hector, MD;  Location: [facility];  Service: General;  Laterality: N/A;  . Cesarean section      3 times   Family History  Problem Relation Age of Onset  . Diabetes Mother     currently 72  . Fibromyalgia Mother   . Cirrhosis Father     died in 62  . Diabetes Maternal Grandmother   . Diabetes Maternal Aunt    History  Substance Use Topics  . Smoking status: Current Every Day Smoker -- 0.10 packs/day for 25 years    Types: Cigarettes  . Smokeless tobacco: Never Used  . Alcohol Use: No     Comment: Endorses hasn't been drinking   OB History    Gravida Para Term Preterm AB TAB SAB Ectopic Multiple Living   4 3 3  0 1 0 1 0 1 2  Review of Systems  Constitutional: Negative for fever and chills.  Respiratory: Negative for cough.   Gastrointestinal: Positive for nausea, vomiting and abdominal pain. Negative for diarrhea.  All other systems reviewed and are negative.     Allergies  Penicillins  Home Medications   Prior to Admission medications   Medication Sig Start Date End Date Taking? Authorizing Provider  divalproex (DEPAKOTE) 500 MG DR tablet Take 1 tablet (500 mg total) by mouth 2 times daily at 12 noon and 4 pm. 08/17/14   Orson Eva, MD  ferrous sulfate 325 (65 FE) MG EC tablet Take 1 tablet (325 mg total) by mouth 3 (three) times daily with meals. 10/26/14   Charlynne Cousins, MD  gabapentin (NEURONTIN) 300 MG  capsule Take 1 capsule (300 mg total) by mouth 3 (three) times daily. 10/12/14   Debbe Odea, MD  insulin aspart (NOVOLOG) 100 UNIT/ML FlexPen Inject 5 Units into the skin 3 (three) times daily with meals. 10/26/14   Charlynne Cousins, MD  Insulin Detemir (LEVEMIR) 100 UNIT/ML Pen Inject 55 Units into the skin daily at 10 pm. 10/26/14   Charlynne Cousins, MD  medroxyPROGESTERone (DEPO-PROVERA) 150 MG/ML injection Inject 150 mg into the muscle every 3 (three) months.    Historical Provider, MD  metoCLOPramide (REGLAN) 10 MG tablet Take 1 tablet (10 mg total) by mouth every 6 (six) hours as needed for nausea (nausea/headache). 11/09/14   Malvin Johns, MD  promethazine (PHENERGAN) 25 MG suppository Place 1 suppository (25 mg total) rectally every 6 (six) hours as needed for nausea or vomiting. 10/12/14   Debbe Odea, MD  risperiDONE (RISPERDAL) 2 MG tablet Take 2 mg by mouth at bedtime.    Historical Provider, MD  traMADol (ULTRAM) 50 MG tablet Take 1-2 tablets (50-100 mg total) by mouth every 6 (six) hours as needed for moderate pain. Patient not taking: Reported on 11/09/2014 10/12/14   Debbe Odea, MD   BP 163/104 mmHg  Pulse 96  Temp(Src) 97.8 F (36.6 C) (Oral)  Resp 18  Ht 5\' 4"  (1.626 m)  Wt 120 lb (54.432 kg)  BMI 20.59 kg/m2  SpO2 98% Physical Exam  Constitutional: She is oriented to person, place, and time.  Patient is a 45 year old female who appears chronically ill and uncomfortable. She is writhing, moaning, and hyperventilating.  HENT:  Head: Normocephalic and atraumatic.  Mucous membranes are somewhat dry.  Eyes: EOM are normal. Pupils are equal, round, and reactive to light.  Neck: Normal range of motion. Neck supple.  Cardiovascular: Normal rate, regular rhythm and normal heart sounds.   No murmur heard. Pulmonary/Chest: Effort normal and breath sounds normal. No respiratory distress. She has no wheezes.  Abdominal: Soft. Bowel sounds are normal. She exhibits no  distension. There is tenderness. There is no rebound and no guarding.  There is tenderness to palpation of the entire abdomen with no rebound and no guarding.  Musculoskeletal: Normal range of motion. She exhibits no edema.  Lymphadenopathy:    She has no cervical adenopathy.  Neurological: She is alert and oriented to person, place, and time.  Skin: Skin is warm and dry.  Nursing note and vitals reviewed.   ED Course  Procedures (including critical care time) Labs Review Labs Reviewed  COMPREHENSIVE METABOLIC PANEL  CBC WITH DIFFERENTIAL/PLATELET  LIPASE, BLOOD    Imaging Review No results found.   EKG Interpretation None      MDM   Final diagnoses:  None    Patient with  recurrent bouts of gastroparesis related to noncompliant diabetes. She presents with generalized abdominal pain and vomiting. Her electrolytes do not reflect DKA and liver and pancreatic enzymes are normal. She was hyperglycemic with sugars in the 500s ever these are now in the low 300s after fluids and insulin. She has been resting comfortably her entire stay in the ER and I see no indication for admission.  I informed the patient that she would be discharged and informed her I would prescribe some anti-emetics for her. She has informed me that she has no transportation to get to the pharmacy and has no money to pay for this medicine when she gets there. I sympathize with her situation, however I see no justification for admission and cannot justify the expense of a hospitalization because she cannot afford these medicines. Her tox screen is repeatedly positive for marijuana and is positive for this again today. I feel that this may contribute to some of her nausea and vomiting and have informed her of this.    Veryl Speak, MD 11/14/14 1100

## 2014-11-14 NOTE — ED Notes (Signed)
Bed: IP77 Expected date:  Expected time:  Means of arrival:  Comments: EMS 43yo F abd pain gastroperisis

## 2014-11-14 NOTE — ED Notes (Signed)
Per EMS pt presents d/t worsening abdominal pain.  Pt endorses N/V.  Pt seen multiple times for same c/o d/t hx of gastroparesis.

## 2014-11-15 ENCOUNTER — Encounter (HOSPITAL_COMMUNITY): Payer: Self-pay | Admitting: *Deleted

## 2014-11-15 ENCOUNTER — Inpatient Hospital Stay (HOSPITAL_COMMUNITY)
Admission: EM | Admit: 2014-11-15 | Discharge: 2014-11-17 | DRG: 637 | Disposition: A | Discharge status: Home Health | Payer: Medicaid Other | Attending: Internal Medicine | Admitting: Internal Medicine

## 2014-11-15 DIAGNOSIS — D72829 Elevated white blood cell count, unspecified: Secondary | ICD-10-CM | POA: Diagnosis present

## 2014-11-15 DIAGNOSIS — G40909 Epilepsy, unspecified, not intractable, without status epilepticus: Secondary | ICD-10-CM

## 2014-11-15 DIAGNOSIS — Z9114 Patient's other noncompliance with medication regimen: Secondary | ICD-10-CM | POA: Diagnosis present

## 2014-11-15 DIAGNOSIS — F3181 Bipolar II disorder: Secondary | ICD-10-CM | POA: Diagnosis present

## 2014-11-15 DIAGNOSIS — F319 Bipolar disorder, unspecified: Secondary | ICD-10-CM | POA: Diagnosis present

## 2014-11-15 DIAGNOSIS — E43 Unspecified severe protein-calorie malnutrition: Secondary | ICD-10-CM | POA: Diagnosis present

## 2014-11-15 DIAGNOSIS — E1065 Type 1 diabetes mellitus with hyperglycemia: Secondary | ICD-10-CM | POA: Diagnosis present

## 2014-11-15 DIAGNOSIS — Z79899 Other long term (current) drug therapy: Secondary | ICD-10-CM | POA: Diagnosis not present

## 2014-11-15 DIAGNOSIS — I158 Other secondary hypertension: Secondary | ICD-10-CM | POA: Diagnosis present

## 2014-11-15 DIAGNOSIS — Z9119 Patient's noncompliance with other medical treatment and regimen: Secondary | ICD-10-CM | POA: Diagnosis present

## 2014-11-15 DIAGNOSIS — Z87442 Personal history of urinary calculi: Secondary | ICD-10-CM | POA: Diagnosis not present

## 2014-11-15 DIAGNOSIS — E1010 Type 1 diabetes mellitus with ketoacidosis without coma: Principal | ICD-10-CM | POA: Diagnosis present

## 2014-11-15 DIAGNOSIS — IMO0002 Reserved for concepts with insufficient information to code with codable children: Secondary | ICD-10-CM | POA: Diagnosis present

## 2014-11-15 DIAGNOSIS — E876 Hypokalemia: Secondary | ICD-10-CM | POA: Diagnosis present

## 2014-11-15 DIAGNOSIS — Z833 Family history of diabetes mellitus: Secondary | ICD-10-CM

## 2014-11-15 DIAGNOSIS — E1143 Type 2 diabetes mellitus with diabetic autonomic (poly)neuropathy: Secondary | ICD-10-CM | POA: Diagnosis not present

## 2014-11-15 DIAGNOSIS — E1310 Other specified diabetes mellitus with ketoacidosis without coma: Secondary | ICD-10-CM

## 2014-11-15 DIAGNOSIS — F17210 Nicotine dependence, cigarettes, uncomplicated: Secondary | ICD-10-CM | POA: Diagnosis present

## 2014-11-15 DIAGNOSIS — Z794 Long term (current) use of insulin: Secondary | ICD-10-CM

## 2014-11-15 DIAGNOSIS — Z681 Body mass index (BMI) 19 or less, adult: Secondary | ICD-10-CM | POA: Diagnosis not present

## 2014-11-15 DIAGNOSIS — E1043 Type 1 diabetes mellitus with diabetic autonomic (poly)neuropathy: Secondary | ICD-10-CM | POA: Diagnosis present

## 2014-11-15 DIAGNOSIS — R112 Nausea with vomiting, unspecified: Secondary | ICD-10-CM

## 2014-11-15 DIAGNOSIS — E1110 Type 2 diabetes mellitus with ketoacidosis without coma: Secondary | ICD-10-CM | POA: Diagnosis present

## 2014-11-15 DIAGNOSIS — K3184 Gastroparesis: Secondary | ICD-10-CM | POA: Diagnosis present

## 2014-11-15 DIAGNOSIS — E860 Dehydration: Secondary | ICD-10-CM | POA: Diagnosis present

## 2014-11-15 LAB — CBC WITH DIFFERENTIAL/PLATELET
BASOS ABS: 0 10*3/uL (ref 0.0–0.1)
Basophils Relative: 0 % (ref 0–1)
Eosinophils Absolute: 0 10*3/uL (ref 0.0–0.7)
Eosinophils Relative: 0 % (ref 0–5)
HEMATOCRIT: 43.4 % (ref 36.0–46.0)
Hemoglobin: 13.5 g/dL (ref 12.0–15.0)
Lymphocytes Relative: 17 % (ref 12–46)
Lymphs Abs: 2.2 10*3/uL (ref 0.7–4.0)
MCH: 23.4 pg — ABNORMAL LOW (ref 26.0–34.0)
MCHC: 31.1 g/dL (ref 30.0–36.0)
MCV: 75.1 fL — AB (ref 78.0–100.0)
MONOS PCT: 3 % (ref 3–12)
Monocytes Absolute: 0.4 10*3/uL (ref 0.1–1.0)
Neutro Abs: 10.1 10*3/uL — ABNORMAL HIGH (ref 1.7–7.7)
Neutrophils Relative %: 79 % — ABNORMAL HIGH (ref 43–77)
Platelets: 176 10*3/uL (ref 150–400)
RBC: 5.78 MIL/uL — AB (ref 3.87–5.11)
RDW: 15.9 % — ABNORMAL HIGH (ref 11.5–15.5)
WBC: 12.7 10*3/uL — ABNORMAL HIGH (ref 4.0–10.5)

## 2014-11-15 LAB — BASIC METABOLIC PANEL
ANION GAP: 21 — AB (ref 5–15)
Anion gap: 15 (ref 5–15)
BUN: 17 mg/dL (ref 6–20)
BUN: 18 mg/dL (ref 6–20)
CO2: 14 mmol/L — ABNORMAL LOW (ref 22–32)
CO2: 17 mmol/L — ABNORMAL LOW (ref 22–32)
CREATININE: 0.79 mg/dL (ref 0.44–1.00)
Calcium: 9.9 mg/dL (ref 8.9–10.3)
Calcium: 10 mg/dL (ref 8.9–10.3)
Chloride: 107 mmol/L (ref 101–111)
Chloride: 113 mmol/L — ABNORMAL HIGH (ref 101–111)
Creatinine, Ser: 0.59 mg/dL (ref 0.44–1.00)
GFR calc Af Amer: >60 mL/min (ref >60)
GFR calc Af Amer: >60 mL/min (ref >60)
GFR calc non Af Amer: >60 mL/min (ref >60)
Glucose, Bld: 405 mg/dL — ABNORMAL HIGH (ref 65–99)
Glucose, Bld: 216 mg/dL — ABNORMAL HIGH (ref 65–99)
Potassium: 5 mmol/L (ref 3.5–5.1)
Potassium: 4 mmol/L (ref 3.5–5.1)
SODIUM: 142 mmol/L (ref 135–145)
Sodium: 145 mmol/L (ref 135–145)

## 2014-11-15 LAB — GLUCOSE, CAPILLARY
GLUCOSE-CAPILLARY: 336 mg/dL — AB (ref 65–99)
GLUCOSE-CAPILLARY: 204 mg/dL — AB (ref 65–99)
Glucose-Capillary: 389 mg/dL — ABNORMAL HIGH (ref 65–99)
Glucose-Capillary: 208 mg/dL — ABNORMAL HIGH (ref 65–99)
Glucose-Capillary: 166 mg/dL — ABNORMAL HIGH (ref 65–99)

## 2014-11-15 LAB — MRSA PCR SCREENING: MRSA BY PCR: NEGATIVE

## 2014-11-15 LAB — CBG MONITORING, ED: Glucose-Capillary: 297 mg/dL — ABNORMAL HIGH (ref 65–99)

## 2014-11-15 MED ORDER — HYDROMORPHONE HCL 1 MG/ML IJ SOLN
1.0000 mg | Freq: Once | INTRAMUSCULAR | Status: AC
  Administered 2014-11-15: 1 mg via INTRAVENOUS
  Filled 2014-11-15: qty 1

## 2014-11-15 MED ORDER — SODIUM CHLORIDE 0.9 % IV BOLUS (SEPSIS)
1000.0000 mL | Freq: Once | INTRAVENOUS | Status: AC
  Administered 2014-11-15: 1000 mL via INTRAVENOUS

## 2014-11-15 MED ORDER — PANTOPRAZOLE SODIUM 40 MG IV SOLR
40.0000 mg | Freq: Two times a day (BID) | INTRAVENOUS | Status: DC
  Administered 2014-11-15 – 2014-11-16 (×3): 40 mg via INTRAVENOUS
  Filled 2014-11-15 (×4): qty 40

## 2014-11-15 MED ORDER — DEXTROSE-NACL 5-0.45 % IV SOLN
INTRAVENOUS | Status: DC
  Administered 2014-11-15 – 2014-11-16 (×2): via INTRAVENOUS

## 2014-11-15 MED ORDER — HYDROMORPHONE HCL 1 MG/ML IJ SOLN
0.5000 mg | INTRAMUSCULAR | Status: DC | PRN
  Administered 2014-11-15 – 2014-11-17 (×7): 0.5 mg via INTRAVENOUS
  Filled 2014-11-15 (×6): qty 1

## 2014-11-15 MED ORDER — DEXTROSE 5 % IV SOLN
500.0000 mg | Freq: Two times a day (BID) | INTRAVENOUS | Status: DC
  Administered 2014-11-16 (×2): 500 mg via INTRAVENOUS
  Filled 2014-11-15 (×5): qty 5

## 2014-11-15 MED ORDER — METOCLOPRAMIDE HCL 5 MG/ML IJ SOLN
5.0000 mg | Freq: Four times a day (QID) | INTRAMUSCULAR | Status: DC
  Administered 2014-11-15 – 2014-11-17 (×7): 5 mg via INTRAVENOUS
  Filled 2014-11-15 (×3): qty 2
  Filled 2014-11-15 (×2): qty 1
  Filled 2014-11-15 (×2): qty 2
  Filled 2014-11-15: qty 1
  Filled 2014-11-15 (×2): qty 2

## 2014-11-15 MED ORDER — SODIUM CHLORIDE 0.9 % IV SOLN
INTRAVENOUS | Status: DC
  Administered 2014-11-15: 3.3 [IU]/h via INTRAVENOUS
  Administered 2014-11-15: 5.5 [IU]/h via INTRAVENOUS
  Filled 2014-11-15: qty 2.5

## 2014-11-15 MED ORDER — ENOXAPARIN SODIUM 40 MG/0.4ML ~~LOC~~ SOLN
40.0000 mg | SUBCUTANEOUS | Status: DC
  Administered 2014-11-15 – 2014-11-16 (×2): 40 mg via SUBCUTANEOUS
  Filled 2014-11-15 (×2): qty 0.4

## 2014-11-15 MED ORDER — METOCLOPRAMIDE HCL 5 MG/ML IJ SOLN
10.0000 mg | Freq: Once | INTRAMUSCULAR | Status: AC
  Administered 2014-11-15: 10 mg via INTRAVENOUS
  Filled 2014-11-15: qty 2

## 2014-11-15 MED ORDER — MORPHINE SULFATE 4 MG/ML IJ SOLN
4.0000 mg | Freq: Once | INTRAMUSCULAR | Status: AC
  Administered 2014-11-15: 4 mg via INTRAVENOUS
  Filled 2014-11-15: qty 1

## 2014-11-15 MED ORDER — ONDANSETRON HCL 4 MG/2ML IJ SOLN
4.0000 mg | Freq: Once | INTRAMUSCULAR | Status: AC
Start: 2014-11-15 — End: 2014-11-15
  Administered 2014-11-15: 4 mg via INTRAVENOUS
  Filled 2014-11-15: qty 2

## 2014-11-15 NOTE — ED Notes (Signed)
Patient called 911 for abdominal pain that began at 0600 with nausea. Patient was seen in emergency for the same complaint yesterday, treated and discharged with phenergan prescription. Patient has scheduled appointment with [facility] on 11/18/14.

## 2014-11-15 NOTE — H&P (Signed)
History and Physical  Mary [name]:[id number] DOB: 20-Feb-1971 DOA: 11/15/2014  Referring physician: EDP PCP: [name], NP   Chief Complaint:  Ab pain, n/v, DKA  HPI: Mary [name] is a 44 y.o. female  Patient is a 44 year old female with history of noncompliant insulin dependent diabetes, gastroparesis, and frequent ER visits for episodes of abdominal pain, nausea, and vomiting. She was seen in the ED yesterday and had workup performed which revealed hyperglycemia but no ketoacidosis. She was hydrated, medicated, and was discharged feeling improved. She states that her symptoms have since returned. She feels worse today than she did yesterday. She was prescribed Phenergan, however did not get this medication filled. She denies bloody stool or vomit. She denies fevers or chills.  ER course: lab worked showed she is in DKA, she is started on insulin drip/DKA protocol, hospitalist called to admit the patient.  Review of Systems:  Detail per HPI, Review of systems are otherwise negative  Past Medical History  Diagnosis Date  . Kidney stone   . Seizure disorder     started with pregnancy of first son  . Gastritis   . Bipolar 2 disorder   . Post traumatic stress disorder     "flipping out" after people close to her died  . Heart murmur   . Arthritis     r ankle-S/P surgery (2007)  . Anemia     childhood  . Depression     childhood  . Hepatitis C     biopsy in 2010-no rx-was supposed to see a hepatologist in [place].  With cirrhosis  . Neuropathy     in legs and feet and hands  . Cataracts, bilateral   . Scoliosis   . Diabetes mellitus     diagnosed in 1996-always been on insulin  . DKA (diabetic ketoacidoses)     Recurrent admissions for DKA, medication non-complaince, poor social situation  . Diabetic neuropathy, type I diabetes mellitus     numbness bilaterally feet  . HTN (hypertension) 10/12/2013   Past Surgical History  Procedure Laterality Date  .  Ankle surgery  2007  . Endometrial biopsy  06/22/2012  . Cholecystectomy  04/07/2013  . Cholecystectomy N/A 04/07/2013    Procedure: LAPAROSCOPIC CHOLECYSTECTOMY WITH INTRAOPERATIVE CHOLANGIOGRAM;  Surgeon: Adin Hector, MD;  Location: [facility];  Service: General;  Laterality: N/A;  . Cesarean section      3 times   Social History:  reports that she has been smoking Cigarettes.  She has a 2.5 pack-year smoking history. She has never used smokeless tobacco. She reports that she uses illicit drugs (Marijuana) about once per week. She reports that she does not drink alcohol. Patient lives at home & is able to participate in activities of daily living independently   Allergies  Allergen Reactions  . Penicillins Rash    Family History  Problem Relation Age of Onset  . Diabetes Mother     currently 22  . Fibromyalgia Mother   . Cirrhosis Father     died in 41  . Diabetes Maternal Grandmother   . Diabetes Maternal Aunt       Prior to Admission medications   Medication Sig Start Date End Date Taking? Authorizing Provider  insulin aspart (NOVOLOG) 100 UNIT/ML FlexPen Inject 5 Units into the skin 3 (three) times daily with meals. 10/26/14  Yes Charlynne Cousins, MD  insulin glargine (LANTUS) 100 UNIT/ML injection Inject 30 Units into the skin at bedtime.   Yes  Historical Provider, MD  divalproex (DEPAKOTE) 500 MG DR tablet Take 1 tablet (500 mg total) by mouth 2 times daily at 12 noon and 4 pm. 08/17/14   Orson Eva, MD  ferrous sulfate 325 (65 FE) MG EC tablet Take 1 tablet (325 mg total) by mouth 3 (three) times daily with meals. 10/26/14   Charlynne Cousins, MD  gabapentin (NEURONTIN) 300 MG capsule Take 1 capsule (300 mg total) by mouth 3 (three) times daily. 10/12/14   Debbe Odea, MD  Insulin Detemir (LEVEMIR) 100 UNIT/ML Pen Inject 55 Units into the skin daily at 10 pm. 10/26/14   Charlynne Cousins, MD  medroxyPROGESTERone (DEPO-PROVERA) 150 MG/ML injection Inject 150 mg into the  muscle every 3 (three) months.    Historical Provider, MD  metoCLOPramide (REGLAN) 10 MG tablet Take 1 tablet (10 mg total) by mouth every 6 (six) hours as needed for nausea (nausea/headache). 11/09/14   Malvin Johns, MD  promethazine (PHENERGAN) 25 MG tablet Take 1 tablet (25 mg total) by mouth every 6 (six) hours as needed for nausea. 11/14/14   Veryl Speak, MD  risperiDONE (RISPERDAL) 2 MG tablet Take 2 mg by mouth at bedtime.    Historical Provider, MD  traMADol (ULTRAM) 50 MG tablet Take 1-2 tablets (50-100 mg total) by mouth every 6 (six) hours as needed for moderate pain. Patient not taking: Reported on 11/09/2014 10/12/14   Debbe Odea, MD    Physical Exam: BP 128/64 mmHg  Pulse 94  Temp(Src) 98.5 F (36.9 C) (Oral)  Resp 17  SpO2 100%  General:  Agitated, crying, yelling Eyes: PERRL ENT: very dry oral mucosa Neck: supple, no JVD Cardiovascular: RRR Respiratory: CTABL Abdomen: soft/ND/ND, positive bowel sounds Skin: no rash Musculoskeletal:  No edema Psychiatric: agitated Neurologic: no focal findings            Labs on Admission:  Basic Metabolic Panel:  Recent Labs Lab 11/09/14 1540 11/09/14 1919 11/14/14 0732 11/15/14 1404  NA 140 140 136 142  K 4.0 4.6 4.8 5.0  CL 108 109 99* 107  CO2 18* 18* 24 14*  GLUCOSE 307* 248* 561* 405*  BUN 11 10 13 17   CREATININE 0.64 0.47 0.82 0.79  CALCIUM 8.9 8.7* 10.0 9.9   Liver Function Tests:  Recent Labs Lab 11/14/14 0732  AST 44*  ALT 39  ALKPHOS 90  BILITOT 0.9  PROT 8.4*  ALBUMIN 4.5    Recent Labs Lab 11/14/14 0732  LIPASE 29   No results for input(s): AMMONIA in the last 168 hours. CBC:  Recent Labs Lab 11/09/14 1540 11/14/14 0732 11/15/14 1404  WBC 10.5 11.6* 12.7*  NEUTROABS 8.8* 7.0 10.1*  HGB 11.9* 14.4 13.5  HCT 37.1 44.5 43.4  MCV 73.2* 73.1* 75.1*  PLT 137* 155 176   Cardiac Enzymes: No results for input(s): CKTOTAL, CKMB, CKMBINDEX, TROPONINI in the last 168 hours.  BNP (last  3 results) No results for input(s): BNP in the last 8760 hours.  ProBNP (last 3 results) No results for input(s): PROBNP in the last 8760 hours.  CBG:  Recent Labs Lab 11/09/14 1436 11/09/14 2256 11/14/14 1000 11/14/14 1024  GLUCAP 290* 210* 343* 317*    Radiological Exams on Admission: No results found.  EKG: Independently reviewed. Sinus tachycardia, no acute St/T changes, QTc wnl.  Assessment/Plan Present on Admission:  . Diabetic ketoacidosis   DKA: admit to stepdown in dka protocol/ivf  Insulin dependent DM2/ Diabetic gastroparesis: iv ppi /reglan, prn dilaudid, currently npo,  advance diet as tolerated.  Leukocytosis, likely from dehydration,no sign of infection. Continue ivf.  H/o seizure? Convert po depakote to iv.  H/o hepc, need to refer to ID clinic for evaluation of treatment if no done in the past.     DVT prophylaxis: lovenox  Consultants: none  Code Status: full   Family Communication:  Patient   Disposition Plan: admit to stepdown  Time spent: 14mins  Elan Brainerd MD, PhD [facility] Pager [phone] If 7PM-7AM, please contact night-coverage at [email or web address], password [facility]

## 2014-11-15 NOTE — ED Provider Notes (Signed)
CSN: [id number]     Arrival date & time 11/15/14  1230 History   First MD Initiated Contact with Patient 11/15/14 1319     Chief Complaint  Patient presents with  . Abdominal Pain  . Nausea     (Consider location/radiation/quality/duration/timing/severity/associated sxs/prior Treatment) HPI Comments: Patient is a 44 year old female with history of noncompliant diabetes, gastroparesis, and frequent ER visits for episodes of abdominal pain, nausea, and vomiting. She was seen by me yesterday and had workup performed which revealed hyperglycemia but no ketoacidosis. She was hydrated, medicated, and was discharged feeling improved. She states that her symptoms have since returned. She feels worse today than she did yesterday. She was prescribed Phenergan, however did not get this medication filled. She denies bloody stool or vomit. She denies fevers or chills.  Patient is a 44 y.o. female presenting with abdominal pain. The history is provided by the patient.  Abdominal Pain Pain location:  Epigastric Pain quality: cramping   Pain radiates to:  Does not radiate Pain severity:  Severe Onset quality:  Sudden Duration:  2 days Timing:  Constant Progression:  Worsening Chronicity:  Recurrent Relieved by:  Nothing Worsened by:  Nothing tried Ineffective treatments:  None tried   Past Medical History  Diagnosis Date  . Kidney stone   . Seizure disorder     started with pregnancy of first son  . Gastritis   . Bipolar 2 disorder   . Post traumatic stress disorder     "flipping out" after people close to her died  . Heart murmur   . Arthritis     r ankle-S/P surgery (2007)  . Anemia     childhood  . Depression     childhood  . Hepatitis C     biopsy in 2010-no rx-was supposed to see a hepatologist in [place].  With cirrhosis  . Neuropathy     in legs and feet and hands  . Cataracts, bilateral   . Scoliosis   . Diabetes mellitus     diagnosed in 1996-always been on insulin   . DKA (diabetic ketoacidoses)     Recurrent admissions for DKA, medication non-complaince, poor social situation  . Diabetic neuropathy, type I diabetes mellitus     numbness bilaterally feet  . HTN (hypertension) 10/12/2013   Past Surgical History  Procedure Laterality Date  . Ankle surgery  2007  . Endometrial biopsy  06/22/2012  . Cholecystectomy  04/07/2013  . Cholecystectomy N/A 04/07/2013    Procedure: LAPAROSCOPIC CHOLECYSTECTOMY WITH INTRAOPERATIVE CHOLANGIOGRAM;  Surgeon: Adin Hector, MD;  Location: [facility];  Service: General;  Laterality: N/A;  . Cesarean section      3 times   Family History  Problem Relation Age of Onset  . Diabetes Mother     currently 74  . Fibromyalgia Mother   . Cirrhosis Father     died in 22  . Diabetes Maternal Grandmother   . Diabetes Maternal Aunt    History  Substance Use Topics  . Smoking status: Current Every Day Smoker -- 0.10 packs/day for 25 years    Types: Cigarettes  . Smokeless tobacco: Never Used  . Alcohol Use: No     Comment: Endorses hasn't been drinking   OB History    Gravida Para Term Preterm AB TAB SAB Ectopic Multiple Living   4 3 3  0 1 0 1 0 1 2     Review of Systems  Gastrointestinal: Positive for abdominal pain.  All other systems  reviewed and are negative.     Allergies  Penicillins  Home Medications   Prior to Admission medications   Medication Sig Start Date End Date Taking? Authorizing Provider  divalproex (DEPAKOTE) 500 MG DR tablet Take 1 tablet (500 mg total) by mouth 2 times daily at 12 noon and 4 pm. 08/17/14   Orson Eva, MD  ferrous sulfate 325 (65 FE) MG EC tablet Take 1 tablet (325 mg total) by mouth 3 (three) times daily with meals. 10/26/14   Charlynne Cousins, MD  gabapentin (NEURONTIN) 300 MG capsule Take 1 capsule (300 mg total) by mouth 3 (three) times daily. 10/12/14   Debbe Odea, MD  insulin aspart (NOVOLOG) 100 UNIT/ML FlexPen Inject 5 Units into the skin 3 (three) times daily  with meals. 10/26/14   Charlynne Cousins, MD  Insulin Detemir (LEVEMIR) 100 UNIT/ML Pen Inject 55 Units into the skin daily at 10 pm. 10/26/14   Charlynne Cousins, MD  medroxyPROGESTERone (DEPO-PROVERA) 150 MG/ML injection Inject 150 mg into the muscle every 3 (three) months.    Historical Provider, MD  metoCLOPramide (REGLAN) 10 MG tablet Take 1 tablet (10 mg total) by mouth every 6 (six) hours as needed for nausea (nausea/headache). 11/09/14   Malvin Johns, MD  promethazine (PHENERGAN) 25 MG tablet Take 1 tablet (25 mg total) by mouth every 6 (six) hours as needed for nausea. 11/14/14   Veryl Speak, MD  risperiDONE (RISPERDAL) 2 MG tablet Take 2 mg by mouth at bedtime.    Historical Provider, MD  traMADol (ULTRAM) 50 MG tablet Take 1-2 tablets (50-100 mg total) by mouth every 6 (six) hours as needed for moderate pain. Patient not taking: Reported on 11/09/2014 10/12/14   Debbe Odea, MD   BP 160/75 mmHg  Pulse 95  Temp(Src) 98.3 F (36.8 C) (Oral)  Resp 19  SpO2 98% Physical Exam  Constitutional: She is oriented to person, place, and time. No distress.  Patient is a 44 year old female who appears chronically ill. She is writhing, moaning.  HENT:  Head: Normocephalic and atraumatic.  Mouth/Throat: Oropharynx is clear and moist.  Eyes: Pupils are equal, round, and reactive to light.  Neck: Normal range of motion. Neck supple.  Cardiovascular: Normal rate and regular rhythm.  Exam reveals no gallop and no friction rub.   No murmur heard. Pulmonary/Chest: Effort normal and breath sounds normal. No respiratory distress. She has no wheezes.  Abdominal: Soft. Bowel sounds are normal. She exhibits no distension. There is no tenderness.  Musculoskeletal: Normal range of motion. She exhibits no edema.  Neurological: She is alert and oriented to person, place, and time.  Skin: Skin is warm and dry. She is not diaphoretic.  Nursing note and vitals reviewed.   ED Course  Procedures (including  critical care time) Labs Review Labs Reviewed  BASIC METABOLIC PANEL  CBC WITH DIFFERENTIAL/PLATELET    Imaging Review No results found.   EKG Interpretation None      MDM   Final diagnoses:  None    Patient seen yesterday for vomiting, gastroparesis, and elevated blood sugar. She was treated with fluids, medications and was feeling better prior to discharge. She returns today with recurrent vomiting, abdominal pain, and inability to tolerate by mouth intake. Today's workup reveals a sugar of 405, bicarbonate of 14, and anion gap of 21 consistent with diabetic ketoacidosis. She was aggressively hydrated, started on an insulin drip, and will be admitted to the hospitalist service under the care of Dr. Erlinda Hong.  CRITICAL CARE Performed by: Veryl Speak Total critical care time: 30 minutes Critical care time was exclusive of separately billable procedures and treating other patients. Critical care was necessary to treat or prevent imminent or life-threatening deterioration. Critical care was time spent personally by me on the following activities: development of treatment plan with patient and/or surrogate as well as nursing, discussions with consultants, evaluation of patient's response to treatment, examination of patient, obtaining history from patient or surrogate, ordering and performing treatments and interventions, ordering and review of laboratory studies, ordering and review of radiographic studies, pulse oximetry and re-evaluation of patient's condition.     Veryl Speak, MD 11/15/14 [phone]

## 2014-11-15 NOTE — ED Notes (Signed)
MD at bedside. 

## 2014-11-15 NOTE — ED Notes (Signed)
Patient was given 141mcg of fentanyl and 4mg  of zofran en route.

## 2014-11-15 NOTE — ED Notes (Signed)
Bed: WA06 Expected date:  Expected time:  Means of arrival:  Comments: EMS- 44yo F, abd pain, IV

## 2014-11-16 DIAGNOSIS — E1310 Other specified diabetes mellitus with ketoacidosis without coma: Secondary | ICD-10-CM

## 2014-11-16 LAB — BASIC METABOLIC PANEL
Anion gap: 9 (ref 5–15)
Anion gap: 10 (ref 5–15)
Anion gap: 10 (ref 5–15)
Anion gap: 8 (ref 5–15)
Anion gap: 11 (ref 5–15)
BUN: 17 mg/dL (ref 6–20)
BUN: 16 mg/dL (ref 6–20)
BUN: 16 mg/dL (ref 6–20)
BUN: 13 mg/dL (ref 6–20)
BUN: 11 mg/dL (ref 6–20)
CALCIUM: 9 mg/dL (ref 8.9–10.3)
CO2: 20 mmol/L — ABNORMAL LOW (ref 22–32)
CO2: 19 mmol/L — ABNORMAL LOW (ref 22–32)
CO2: 20 mmol/L — ABNORMAL LOW (ref 22–32)
CO2: 22 mmol/L (ref 22–32)
CO2: 21 mmol/L — ABNORMAL LOW (ref 22–32)
CREATININE: 0.55 mg/dL (ref 0.44–1.00)
Calcium: 9.6 mg/dL (ref 8.9–10.3)
Calcium: 9.4 mg/dL (ref 8.9–10.3)
Calcium: 9.4 mg/dL (ref 8.9–10.3)
Calcium: 9 mg/dL (ref 8.9–10.3)
Chloride: 113 mmol/L — ABNORMAL HIGH (ref 101–111)
Chloride: 112 mmol/L — ABNORMAL HIGH (ref 101–111)
Chloride: 110 mmol/L (ref 101–111)
Chloride: 106 mmol/L (ref 101–111)
Chloride: 104 mmol/L (ref 101–111)
Creatinine, Ser: 0.56 mg/dL (ref 0.44–1.00)
Creatinine, Ser: 0.53 mg/dL (ref 0.44–1.00)
Creatinine, Ser: 0.61 mg/dL (ref 0.44–1.00)
Creatinine, Ser: 0.38 mg/dL — ABNORMAL LOW (ref 0.44–1.00)
GFR calc Af Amer: >60 mL/min (ref >60)
GFR calc Af Amer: >60 mL/min (ref >60)
GFR calc Af Amer: >60 mL/min (ref >60)
GFR calc Af Amer: >60 mL/min (ref >60)
GFR calc non Af Amer: >60 mL/min (ref >60)
GFR calc non Af Amer: >60 mL/min (ref >60)
GFR calc non Af Amer: >60 mL/min (ref >60)
GFR calc non Af Amer: >60 mL/min (ref >60)
GFR calc non Af Amer: >60 mL/min (ref >60)
Glucose, Bld: 167 mg/dL — ABNORMAL HIGH (ref 65–99)
Glucose, Bld: 180 mg/dL — ABNORMAL HIGH (ref 65–99)
Glucose, Bld: 250 mg/dL — ABNORMAL HIGH (ref 65–99)
Glucose, Bld: 272 mg/dL — ABNORMAL HIGH (ref 65–99)
Glucose, Bld: 246 mg/dL — ABNORMAL HIGH (ref 65–99)
Potassium: 4.2 mmol/L (ref 3.5–5.1)
Potassium: 3.7 mmol/L (ref 3.5–5.1)
Potassium: 3.8 mmol/L (ref 3.5–5.1)
Potassium: 3.5 mmol/L (ref 3.5–5.1)
Potassium: 3.8 mmol/L (ref 3.5–5.1)
SODIUM: 136 mmol/L (ref 135–145)
Sodium: 142 mmol/L (ref 135–145)
Sodium: 141 mmol/L (ref 135–145)
Sodium: 140 mmol/L (ref 135–145)
Sodium: 136 mmol/L (ref 135–145)

## 2014-11-16 LAB — GLUCOSE, CAPILLARY
GLUCOSE-CAPILLARY: 164 mg/dL — AB (ref 65–99)
GLUCOSE-CAPILLARY: 236 mg/dL — AB (ref 65–99)
GLUCOSE-CAPILLARY: 266 mg/dL — AB (ref 65–99)
Glucose-Capillary: 182 mg/dL — ABNORMAL HIGH (ref 65–99)
Glucose-Capillary: 156 mg/dL — ABNORMAL HIGH (ref 65–99)
Glucose-Capillary: 204 mg/dL — ABNORMAL HIGH (ref 65–99)

## 2014-11-16 MED ORDER — SODIUM CHLORIDE 0.9 % IV SOLN: INTRAVENOUS | Status: DC

## 2014-11-16 MED ORDER — DIPHENHYDRAMINE HCL 25 MG PO CAPS
25.0000 mg | ORAL_CAPSULE | ORAL | Status: DC | PRN
  Administered 2014-11-16 – 2014-11-17 (×2): 25 mg via ORAL
  Filled 2014-11-16 (×3): qty 1

## 2014-11-16 MED ORDER — ONDANSETRON HCL 4 MG/2ML IJ SOLN
4.0000 mg | Freq: Four times a day (QID) | INTRAMUSCULAR | Status: DC | PRN
  Administered 2014-11-16: 4 mg via INTRAVENOUS
  Filled 2014-11-16: qty 2

## 2014-11-16 MED ORDER — INSULIN GLARGINE 100 UNIT/ML ~~LOC~~ SOLN
30.0000 [IU] | Freq: Every day | SUBCUTANEOUS | Status: DC
  Administered 2014-11-16: 30 [IU] via SUBCUTANEOUS
  Filled 2014-11-16 (×2): qty 0.3

## 2014-11-16 MED ORDER — INSULIN ASPART 100 UNIT/ML ~~LOC~~ SOLN
0.0000 [IU] | Freq: Every day | SUBCUTANEOUS | Status: DC
  Administered 2014-11-16: 2 [IU] via SUBCUTANEOUS

## 2014-11-16 MED ORDER — INSULIN ASPART 100 UNIT/ML ~~LOC~~ SOLN
0.0000 [IU] | Freq: Three times a day (TID) | SUBCUTANEOUS | Status: DC
  Administered 2014-11-17: 3 [IU] via SUBCUTANEOUS

## 2014-11-16 MED ORDER — DIPHENHYDRAMINE HCL 50 MG PO CAPS
50.0000 mg | ORAL_CAPSULE | Freq: Once | ORAL | Status: AC
  Administered 2014-11-16: 50 mg via ORAL
  Filled 2014-11-16: qty 1

## 2014-11-16 MED ORDER — INSULIN ASPART 100 UNIT/ML ~~LOC~~ SOLN
0.0000 [IU] | Freq: Three times a day (TID) | SUBCUTANEOUS | Status: DC
  Administered 2014-11-16 (×2): 8 [IU] via SUBCUTANEOUS

## 2014-11-16 MED ORDER — HYDRALAZINE HCL 20 MG/ML IJ SOLN: 5.0000 mg | Freq: Four times a day (QID) | INTRAMUSCULAR | Status: DC | PRN

## 2014-11-16 MED ORDER — INSULIN ASPART 100 UNIT/ML ~~LOC~~ SOLN: 4.0000 [IU] | Freq: Three times a day (TID) | SUBCUTANEOUS | Status: DC

## 2014-11-16 MED ORDER — INSULIN DETEMIR 100 UNIT/ML ~~LOC~~ SOLN: 50.0000 [IU] | Freq: Every day | SUBCUTANEOUS | Status: DC

## 2014-11-16 MED ORDER — DIVALPROEX SODIUM 500 MG PO DR TAB
500.0000 mg | DELAYED_RELEASE_TABLET | Freq: Once | ORAL | Status: AC
  Administered 2014-11-16: 500 mg via ORAL
  Filled 2014-11-16: qty 1

## 2014-11-16 NOTE — Progress Notes (Signed)
Initial Nutrition Assessment  DOCUMENTATION CODES:  Severe malnutrition in context of acute illness/injury  INTERVENTION:  Once diet advanced, recommend Glucerna Shake po TID, each supplement provides 220 kcal and 10 grams of protein  RD to follow up for educational needs  NUTRITION DIAGNOSIS:  Malnutrition related to chronic illness as evidenced by moderate depletion of body fat, moderate depletions of muscle mass.  GOAL:  Patient will meet greater than or equal to 90% of their needs  MONITOR:  PO intake, Diet advancement, Labs, Weight trends  REASON FOR ASSESSMENT:  Malnutrition Screening Tool    ASSESSMENT: 44 year old female with history of noncompliant insulin dependent diabetes, gastroparesis, and frequent ER visits for episodes of abdominal pain, nausea, and vomiting.  - Pt reports poor appetite prior to admission. Current diet is clear liquids. Pt is tolerating.  - Pt does not follow a Diabetic diet at home. Was able to name foods that increase blood glucose.  - Provided nutritional handouts and brief education to pt. RD to follow-up to answer further questions at a later time.  - Will order nutritional supplements once diet advanced.  - Labs and medications reviewed   CBGs 156-266  Height:  Ht Readings from Last 1 Encounters:  11/15/14 5\' 4"  (1.626 m)    Weight:  Wt Readings from Last 1 Encounters:  11/16/14 112 lb 14 oz (51.2 kg)    Ideal Body Weight:  54.6 kg  Wt Readings from Last 10 Encounters:  11/16/14 112 lb 14 oz (51.2 kg)  11/14/14 120 lb (54.432 kg)  10/23/14 115 lb 1.3 oz (52.2 kg)  10/09/14 132 lb 4.4 oz (60 kg)  09/27/14 120 lb (54.432 kg)  08/14/14 114 lb 10.2 oz (52 kg)  08/10/14 125 lb (56.7 kg)  07/26/14 112 lb 7 oz (51 kg)  06/06/14 130 lb (58.968 kg)  05/31/14 133 lb (60.328 kg)    BMI:  Body mass index is 19.37 kg/(m^2).  Estimated Nutritional Needs:  Kcal:  1600-1800  Protein:  70-85 g  Fluid:  1.8 L/day  Skin:   Reviewed, no issues  Diet Order:  Diet clear liquid Room service appropriate?: Yes; Fluid consistency:: Thin  EDUCATION NEEDS:  Education needs addressed   Intake/Output Summary (Last 24 hours) at 11/16/14 1520 Last data filed at 11/16/14 1100  Gross per 24 hour  Intake 1284.94 ml  Output   1050 ml  Net 234.94 ml    Last BM:  Prior to admission  Laurette Schimke [place], [place], [place]

## 2014-11-16 NOTE — Care Management Note (Signed)
Case Management Note  Patient Details  Name: TALAH COOKSTON MRN: [id number] Date of Birth: March 26, 1971  Subjective/Objective:            dka and insulin drip        Action/Plan: home   Expected Discharge Date:   (unknown)   [id number]          Expected Discharge Plan:  Home/Self Care  In-House Referral:  Clinical Social Work  Discharge planning Services  CM Consult  Post Acute Care Choice:    Choice offered to:     DME Arranged:    DME Agency:     HH Arranged:    [place] Agency:     Status of Service:  In process, will continue to follow  Medicare Important Message Given:    Date Medicare IM Given:    Medicare IM give by:    Date Additional Medicare IM Given:    Additional Medicare Important Message give by:     If discussed at [place] of Stay Meetings, dates discussed:    Additional Comments:  Leeroy Cha, RN 11/16/2014, 8:47 AM

## 2014-11-16 NOTE — Progress Notes (Signed)
[facility] PROGRESS NOTE  Mary [name] XMI:[id number] DOB: 1971/03/23 DOA: 11/15/2014 PCP: [name], NP  Assessment/Plan: 1. DKA: Admitted to step down. AG closed. Restarted on long acting insulin with lantus 30 units daily.  Slowly increase it as needed.  - on SSI. 2. Accelerated Hypertension: - started on Hydralazine.   3. H/o seizures: on depakote.     Code Status: full code Family Communication: full code.  Disposition Plan: pending.    Consultants:  none  Procedures:  none  Antibiotics:  none  HPI/Subjective: Some abdominal pain.   Objective: Filed Vitals:   11/16/14 1300  BP: 189/101  Pulse: 86  Temp:   Resp: 12    Intake/Output Summary (Last 24 hours) at 11/16/14 1428 Last data filed at 11/16/14 1100  Gross per 24 hour  Intake 2284.94 ml  Output   1050 ml  Net 1234.94 ml   Filed Weights   11/15/14 1717 11/16/14 0400  Weight: 50 kg (110 lb 3.7 oz) 51.2 kg (112 lb 14 oz)    Exam:   General:  Alert afebrile comfortable  Cardiovascular: s1s2  Respiratory: ctab  Abdomen: soft , mild tenderness,  non distended bowel sounds heard  Musculoskeletal: no pedal edema.   Data Reviewed: Basic Metabolic Panel:  Recent Labs Lab 11/15/14 1404 11/15/14 2104 11/16/14 0204 11/16/14 0504 11/16/14 0900  NA 142 145 142 141 140  K 5.0 4.0 4.2 3.7 3.8  CL 107 113* 113* 112* 110  CO2 14* 17* 20* 19* 20*  GLUCOSE 405* 216* 167* 180* 250*  BUN 17 18 17 16 16   CREATININE 0.79 0.59 0.56 0.53 0.61  CALCIUM 9.9 10.0 9.6 9.4 9.4   Liver Function Tests:  Recent Labs Lab 11/14/14 0732  AST 44*  ALT 39  ALKPHOS 90  BILITOT 0.9  PROT 8.4*  ALBUMIN 4.5    Recent Labs Lab 11/14/14 0732  LIPASE 29   No results for input(s): AMMONIA in the last 168 hours. CBC:  Recent Labs Lab 11/09/14 1540 11/14/14 0732 11/15/14 1404  WBC 10.5 11.6* 12.7*  NEUTROABS 8.8* 7.0 10.1*  HGB 11.9* 14.4 13.5  HCT 37.1 44.5 43.4  MCV 73.2*  73.1* 75.1*  PLT 137* 155 176   Cardiac Enzymes: No results for input(s): CKTOTAL, CKMB, CKMBINDEX, TROPONINI in the last 168 hours. BNP (last 3 results) No results for input(s): BNP in the last 8760 hours.  ProBNP (last 3 results) No results for input(s): PROBNP in the last 8760 hours.  CBG:  Recent Labs Lab 11/16/14 0001 11/16/14 0109 11/16/14 0426 11/16/14 0839 11/16/14 1225  GLUCAP 164* 182* 156* 204* 266*    Recent Results (from the past 240 hour(s))  MRSA PCR Screening     Status: None   Collection Time: 11/15/14  5:14 PM  Result Value Ref Range Status   MRSA by PCR NEGATIVE NEGATIVE Final    Comment:        The GeneXpert MRSA Assay (FDA approved for NASAL specimens only), is one component of a comprehensive MRSA colonization surveillance program. It is not intended to diagnose MRSA infection nor to guide or monitor treatment for MRSA infections.      Studies: No results found.  Scheduled Meds: . enoxaparin (LOVENOX) injection  40 mg Subcutaneous Q24H  . insulin aspart  0-15 Units Subcutaneous TID WC  . insulin glargine  30 Units Subcutaneous Daily  . metoCLOPramide (REGLAN) injection  5 mg Intravenous 4 times per day  . pantoprazole (PROTONIX) IV  40 mg Intravenous Q12H  . valproate sodium  500 mg Intravenous Q12H   Continuous Infusions: . dextrose 5 % and 0.45% NaCl Stopped (11/16/14 0720)    Active Problems:   DKA (diabetic ketoacidoses)   Diabetic ketoacidosis    Time spent: 35 min    [facility] Pager [phone] If 7PM-7AM, please contact night-coverage at [email or web address], password [facility] 11/16/2014, 2:28 PM  LOS: 1 day

## 2014-11-16 NOTE — Progress Notes (Addendum)
Inpatient Diabetes Program Recommendations  AACE/ADA: New Consensus Statement on Inpatient Glycemic Control (2013)  Target Ranges:  Prepandial:   less than 140 mg/dL      Peak postprandial:   less than 180 mg/dL (1-2 hours)      Critically ill patients:  140 - 180 mg/dL   Reason for Visit: DKA  Diabetes history: DM1 Outpatient Diabetes medications: Lantus 30 units QHS, Novolog 5 units tidwc Current orders for Inpatient glycemic control: Lantus 30 units QD, GlucoStabilizer per ED GlucoStabilizer Order Set  Results for Mary [name], Mary [name] (MRN [id number]) as of 11/16/2014 10:52  Ref. Range 11/16/2014 09:00  Sodium Latest Ref Range: 135-145 mmol/L 140  Potassium Latest Ref Range: 3.5-5.1 mmol/L 3.8  Chloride Latest Ref Range: 101-111 mmol/L 110  CO2 Latest Ref Range: 22-32 mmol/L 20 (L)  BUN Latest Ref Range: 6-20 mg/dL 16  Creatinine Latest Ref Range: 0.44-1.00 mg/dL 0.61  Calcium Latest Ref Range: 8.9-10.3 mg/dL 9.4  EGFR (Non-African Amer.) Latest Ref Range: >60 mL/min >60  EGFR (African American) Latest Ref Range: >60 mL/min >60  Glucose Latest Ref Range: 65-99 mg/dL 250 (H)  Anion gap Latest Ref Range: 5-15  10  Results for Mary [name], Mary [name] (MRN [id number]) as of 11/16/2014 10:52  Ref. Range 11/15/2014 22:57 11/16/2014 00:01 11/16/2014 01:09 11/16/2014 04:26 11/16/2014 08:39  Glucose-Capillary Latest Ref Range: 65-99 mg/dL 166 (H) 164 (H) 182 (H) 156 (H) 204 (H)   Needs rapid-acting insulin - both correction and meal coverage. Blood sugars rising since GlucoStabilizer discontinued.  Recommendations: Add Novolog sensitive tidwc and hs - to start immediately Novolog 4 units tidwc for meal coverage insulin. BMET Q4H x 3.  Will follow. Thank you. Lorenda Peck, RD, LDN, CDE Inpatient Diabetes Coordinator [phone]

## 2014-11-16 NOTE — Progress Notes (Signed)
Date:  November 16, 2014 U.R. performed for needs and level of care. Will continue to follow for Case Management needs.  Velva Harman, RN, BSN, [place]   [phone]

## 2014-11-16 NOTE — Progress Notes (Signed)
Report given Larene Beach RN for patient transfer to room 1412. ABC's intact, patient transported per wheelchair. Denies needs or wants at this time.

## 2014-11-17 ENCOUNTER — Telehealth: Payer: Self-pay

## 2014-11-17 DIAGNOSIS — E1065 Type 1 diabetes mellitus with hyperglycemia: Secondary | ICD-10-CM

## 2014-11-17 DIAGNOSIS — E876 Hypokalemia: Secondary | ICD-10-CM

## 2014-11-17 DIAGNOSIS — E1010 Type 1 diabetes mellitus with ketoacidosis without coma: Principal | ICD-10-CM

## 2014-11-17 LAB — BASIC METABOLIC PANEL
ANION GAP: 9 (ref 5–15)
BUN: 9 mg/dL (ref 6–20)
CALCIUM: 9 mg/dL (ref 8.9–10.3)
CO2: 22 mmol/L (ref 22–32)
CREATININE: 0.4 mg/dL — AB (ref 0.44–1.00)
Chloride: 103 mmol/L (ref 101–111)
GFR calc Af Amer: >60 mL/min (ref >60)
Glucose, Bld: 223 mg/dL — ABNORMAL HIGH (ref 65–99)
POTASSIUM: 3 mmol/L — AB (ref 3.5–5.1)
SODIUM: 134 mmol/L — AB (ref 135–145)

## 2014-11-17 LAB — GLUCOSE, CAPILLARY: Glucose-Capillary: 219 mg/dL — ABNORMAL HIGH (ref 65–99)

## 2014-11-17 MED ORDER — INSULIN ASPART 100 UNIT/ML FLEXPEN: 7.0000 [IU] | PEN_INJECTOR | Freq: Three times a day (TID) | SUBCUTANEOUS | Status: AC

## 2014-11-17 MED ORDER — POTASSIUM CHLORIDE CRYS ER 20 MEQ PO TBCR
40.0000 meq | EXTENDED_RELEASE_TABLET | ORAL | Status: DC
  Administered 2014-11-17: 40 meq via ORAL
  Filled 2014-11-17: qty 2

## 2014-11-17 MED ORDER — INSULIN ASPART 100 UNIT/ML ~~LOC~~ SOLN
7.0000 [IU] | Freq: Three times a day (TID) | SUBCUTANEOUS | Status: DC
Start: 2014-11-17 — End: 2014-11-17
  Administered 2014-11-17: 7 [IU] via SUBCUTANEOUS

## 2014-11-17 MED ORDER — METOCLOPRAMIDE HCL 5 MG PO TABS: 10.0000 mg | ORAL_TABLET | Freq: Four times a day (QID) | ORAL | Status: AC | PRN

## 2014-11-17 NOTE — Discharge Summary (Addendum)
Physician Discharge Summary  [name] FXT:[id number] DOB: 1971/01/06 DOA: 11/15/2014  PCP: [name], NP  Admit date: 11/15/2014 Discharge date: 11/17/2014  Time spent: 35 minutes  Recommendations for Outpatient Follow-up:  1. Discharge home with [facility]. Home health services (RN and Education officer, museum) arranged. 2. Patient encouraged to go to [facility] to let her prescriptions.  Discharge Diagnoses:  Principal problem DKA  Active Problems:   Seizure disorder   Bipolar 1 disorder   DM (diabetes mellitus), type 1, uncontrolled   Hypokalemia    Discharge Condition: Fair  Diet recommendation: Diabetic  Filed Weights   11/15/14 1717 11/16/14 0400  Weight: 50 kg (110 lb 3.7 oz) 51.2 kg (112 lb 14 oz)    History of present illness:  44 year old female with insulin-dependent diabetes mellitus (noncompliant with medications and follow-up), gastroparesis Mary [name] multiple ED visits and hospitalization for DKA who was seen in the ED 1 day prior to hospitalization for hyperglycemia without ketoacidosis. She was hydrated, given insulin and discharged home after she felt better and blood sugar was controlled.. She however return back to the ED and she felt worse and did not fill up her prescriptions. In the ED she was found to be in DKA and started on insulin drip and admitted to stepdown.   Hospital Course:  unontrolled type 1 diabetes mellitus with recurrent DKA This is again due to noncompliance and patient stating unable to purchase her insulin and issues with transportation going to the doctor's office. -Patient's insulin regimen was recently changed to Lantus 55 units once a day and 5 units of NovoLog with meals. Patient encouraged to follow-up at [facility] and get her perception. Case manager has had discussion with the [facility] and also provided assistance with medications during previous  hospitalizations. Patient again strongly encouraged to take her insulin and ensure follow-up as outpatient. -Patient will resume her home dose of Lantus and I will increase her NovoLog to 7 units 3 times a day. I have prescribed her with Reglan as well. Patient's blood glucose are stable at this time. She is clinically stable and can be discharged home with outpatient follow-up. Patient is at high risk for rehospitalization.  History of seizures On Depakote.  Accelerated hypertension Secondary to DKA. Currently stable. Not on any medications.  Severe protein calorie malnutrition  follow up as outpt  Diet: Diabetic  CODE STATUS: Full code   Procedures:  None  Consultations:  None  Discharge Exam: Filed Vitals:   11/17/14 0657  BP: 132/64  Pulse: 66  Temp: 98.9 F (37.2 C)  Resp: 16    General: Middle aged female in no acute distress HEENT: No pallor, moist oral mucosa, supple neck Chest: Clear to auscultation bilaterally, no added sounds CVS: Normal S1 and S2, no murmurs or gallop GI: Soft, nondistended, nontender, bowel sounds present  Musculoskeletal: Warm, no edema CNS: Alert and oriented   Discharge Instructions    Current Discharge Medication List    CONTINUE these medications which have CHANGED   Details  insulin aspart (NOVOLOG) 100 UNIT/ML FlexPen Inject 7 Units into the skin 3 (three) times daily with meals. Qty: 15 mL, Refills: 11    metoCLOPramide (REGLAN) 5 MG tablet Take 2 tablets (10 mg total) by mouth every 6 (six) hours as needed for nausea (nausea). Qty: 30 tablet, Refills: 0      CONTINUE these medications which have NOT CHANGED   Details  divalproex (DEPAKOTE) 500  MG DR tablet Take 1 tablet (500 mg total) by mouth 2 times daily at 12 noon and 4 pm. Qty: 60 tablet, Refills: 0    Insulin Detemir (LEVEMIR) 100 UNIT/ML Pen Inject 55 Units into the skin daily at 10 pm. Qty: 15 mL, Refills: 11    medroxyPROGESTERone (DEPO-PROVERA) 150  MG/ML injection Inject 150 mg into the muscle every 3 (three) months.    risperiDONE (RISPERDAL) 2 MG tablet Take 2 mg by mouth at bedtime.      STOP taking these medications     insulin glargine (LANTUS) 100 UNIT/ML injection      gabapentin (NEURONTIN) 300 MG capsule      promethazine (PHENERGAN) 25 MG tablet      traMADol (ULTRAM) 50 MG tablet        Allergies  Allergen Reactions  . Penicillins Rash   Follow-up Information    Follow up with [name], NP. Schedule an appointment as soon as possible for a visit in 1 week.   Specialty:  Internal Medicine   Contact information:   [place]  [phone]        The results of significant diagnostics from this hospitalization (including imaging, microbiology, ancillary and laboratory) are listed below for reference.    Significant Diagnostic Studies: No results found.  Microbiology: Recent Results (from the past 240 hour(s))  MRSA PCR Screening     Status: None   Collection Time: 11/15/14  5:14 PM  Result Value Ref Range Status   MRSA by PCR NEGATIVE NEGATIVE Final    Comment:        The GeneXpert MRSA Assay (FDA approved for NASAL specimens only), is one component of a comprehensive MRSA colonization surveillance program. It is not intended to diagnose MRSA infection nor to guide or monitor treatment for MRSA infections.      Labs: Basic Metabolic Panel:  Recent Labs Lab 11/16/14 0504 11/16/14 0900 11/16/14 1750 11/16/14 2144 11/17/14 0447  NA 141 140 136 136 134*  K 3.7 3.8 3.5 3.8 3.0*  CL 112* 110 106 104 103  CO2 19* 20* 22 21* 22  GLUCOSE 180* 250* 272* 246* 223*  BUN 16 16 13 11 9   CREATININE 0.53 0.61 0.55 0.38* 0.40*  CALCIUM 9.4 9.4 9.0 9.0 9.0   Liver Function Tests:  Recent Labs Lab 11/14/14 0732  AST 44*  ALT 39  ALKPHOS 90  BILITOT 0.9  PROT 8.4*  ALBUMIN 4.5    Recent Labs Lab 11/14/14 0732  LIPASE 29   No results for input(s):  AMMONIA in the last 168 hours. CBC:  Recent Labs Lab 11/14/14 0732 11/15/14 1404  WBC 11.6* 12.7*  NEUTROABS 7.0 10.1*  HGB 14.4 13.5  HCT 44.5 43.4  MCV 73.1* 75.1*  PLT 155 176   Cardiac Enzymes: No results for input(s): CKTOTAL, CKMB, CKMBINDEX, TROPONINI in the last 168 hours. BNP: BNP (last 3 results) No results for input(s): BNP in the last 8760 hours.  ProBNP (last 3 results) No results for input(s): PROBNP in the last 8760 hours.  CBG:  Recent Labs Lab 11/16/14 0426 11/16/14 0839 11/16/14 1225 11/16/14 2217 11/17/14 0729  GLUCAP 156* 204* 266* 236* 219*       Signed:  Kallon Caylor  [facility] 11/17/2014, 8:56 AM

## 2014-11-17 NOTE — Plan of Care (Signed)
Problem: Phase II Progression Outcomes Goal: Monitor hydration status Outcome: Progressing Tolerating clear liquids, good intake

## 2014-11-17 NOTE — Care Management Note (Signed)
Case Management Note  Patient Details  Name: Mary [name] MRN: [id number] Date of Birth: 17-Aug-1970  Subjective/Objective:     Patient states having difficulty getting information for medicaid requirements-recommended [place] social worker to [place] highly encouraged to assist the [facility] team with all info required for medicaid assistance.Patient voiced understanding.[facility] rep Kristen aware of [place] orders, & d/c.               Action/Plan:d/c plan w/[facility]HC.   Expected Discharge Date:   (unknown)               Expected Discharge Plan:  [place] ([place] social worker)  In-House Referral:  Clinical Social Work  Discharge planning Services  CM Consult (follows @ [facility]-pcp,pharmacy.)  Post Acute Care Choice:    Choice offered to:     DME Arranged:    DME Agency:     HH Arranged:  RN (Education officer, museum.) [place]:  [place]  Status of Service:  Completed, signed off  Medicare Important Message Given:    Date Medicare IM Given:    Medicare IM give by:    Date Additional Medicare IM Given:    Additional Medicare Important Message give by:     If discussed at [place] of Stay Meetings, dates discussed:    Additional Comments:  Dessa Phi, RN 11/17/2014, 11:20 AM

## 2014-11-17 NOTE — Progress Notes (Signed)
[facility]  Patient Status: Active (receiving services up to time of hospitalization)  [facility] is providing the following services: RN and MSW  If patient discharges after hours, please call [phone].   Mary [name] 11/17/2014, 11:10 AM

## 2014-11-17 NOTE — Telephone Encounter (Signed)
This RN spoke with Mary Phi, RN CM about patient's need for hospital follow-up. Patient discharged home with [facility] RN and SW services with [place].  Patient has 6 inpatient admissions and 8 ED visits in the last 6 months for diabetes related complications. Questionable medication compliance.  Patient has an appointment scheduled with [name] (her PCP) on 11/18/14 at 1030. Call placed to patient to remind her of appointment, to address and determine if patient has all needed discharge medications, and to determine if she has ride available to appointment.  Voicemail left for patient requesting return call.

## 2014-11-18 ENCOUNTER — Encounter: Payer: Self-pay | Admitting: Internal Medicine

## 2014-11-18 ENCOUNTER — Ambulatory Visit: Payer: Medicaid Other | Attending: Internal Medicine | Admitting: Internal Medicine

## 2014-11-18 VITALS — BP 121/84 | HR 109 | Temp 99.0°F | Ht 64.0 in | Wt 112.0 lb

## 2014-11-18 DIAGNOSIS — Z9111 Patient's noncompliance with dietary regimen: Secondary | ICD-10-CM | POA: Diagnosis not present

## 2014-11-18 DIAGNOSIS — Z9114 Patient's other noncompliance with medication regimen: Secondary | ICD-10-CM | POA: Diagnosis not present

## 2014-11-18 DIAGNOSIS — R824 Acetonuria: Secondary | ICD-10-CM

## 2014-11-18 DIAGNOSIS — E1165 Type 2 diabetes mellitus with hyperglycemia: Secondary | ICD-10-CM

## 2014-11-18 DIAGNOSIS — Z793 Long term (current) use of hormonal contraceptives: Secondary | ICD-10-CM | POA: Insufficient documentation

## 2014-11-18 DIAGNOSIS — R739 Hyperglycemia, unspecified: Secondary | ICD-10-CM

## 2014-11-18 LAB — POCT URINALYSIS DIPSTICK
BILIRUBIN UA: NEGATIVE
Bilirubin, UA: NEGATIVE
Blood, UA: NEGATIVE
GLUCOSE UA: 500
Glucose, UA: 500
KETONES UA: 15
Leukocytes, UA: NEGATIVE
Leukocytes, UA: NEGATIVE
NITRITE UA: NEGATIVE
Nitrite, UA: NEGATIVE
PH UA: 6.5
PH UA: 6.5
PROTEIN UA: NEGATIVE
Protein, UA: NEGATIVE
RBC UA: NEGATIVE
Spec Grav, UA: 1.015
Spec Grav, UA: 1.015
Urobilinogen, UA: 0.2
Urobilinogen, UA: 0.2

## 2014-11-18 LAB — GLUCOSE, POCT (MANUAL RESULT ENTRY)
POC Glucose: 423 mg/dl — AB (ref 70–99)
POC Glucose: 228 mg/dl — AB (ref 70–99)

## 2014-11-18 MED ORDER — INSULIN ASPART 100 UNIT/ML ~~LOC~~ SOLN
20.0000 [IU] | Freq: Once | SUBCUTANEOUS | Status: AC
  Administered 2014-11-18: 20 [IU] via SUBCUTANEOUS

## 2014-11-18 NOTE — Progress Notes (Signed)
Patient recently hospitalized for hyperglycemia and is here to follow up Her CBG today is 423-20 units Novolog given Urinalysis gotten Patient needs refills of her test strips

## 2014-11-18 NOTE — Progress Notes (Signed)
11/18/14 at 1030-This RN met with patient prior to her hospital follow-up appointment with PCP [name], NP.  Patient has had 6 inpatient admissions and 8 ED visits in the last 6 months for diabetes related complications.  Patient last admitted 11/15/14-11/17/14 for DKA.  Patient indicates she lives in a private residence alone, and she typically uses bus or her mother for needed transportation. Patient indicates her mother provided transportation to her appointment today.  She indicates she was discharged with resumption of home health services (SN, SW) with [place] and that home care has already resumed. Best contact number for her is [phone].  Patient uninsured. She indicates she has been deemed disabled and is working on her Medicaid application. This RN strongly encouraged patient to complete her Medicaid application as Medicaid would reduce her medical care costs.  Patient verbalized understanding and indicates she has also met with Development worker, community at [facility] and [facility] but is waiting on one document before she can hand in paperwork.  Again, stressed importance of turning in all needed documentation for [employer] or [employer] as these programs can assist patient with obtaining needed medical care and medications while Medicaid application pending. Patient again verbalized understanding. Discussed patient's home medications and home medical supplies. Patient indicates she is out of glucose monitoring strips and Flexipen needles so she has been unable to check blood glucose at home over 7 days.  Patient acknowledges that not monitoring her blood glucose may have contributed to recent hospitalization.  Discussed importance of compliance with PCP recommendations, medications, and blood glucose monitoring. Patient again verbalized understanding. Thoroughly discussed patient's discharge medications.  Patient had not yet obtained medications and had scripts with her  for Novolog and Reglan.  Patient indicates she has been taking Lantus, but this medication was discontinued and Levemir prescribed at discharge.  Spoke with [employer] [facility] [place] who indicate patient would be given samples for Novolog and Levemir today and would need to meet with pharmacy staff to discuss PASS program.  Prescriptions given to pharmacy and pharmacy tech indicates she would discuss need for appointment to discuss PASS program with patient.  Per pharmacy tech, reglan would cost $4.00. Patient could charge to account as long as some payment made today. Patient informed.  [name], NP also informed of all above information including need for glucose monitoring strips, flexipen needles and that patient able obtain all discharge medications today from pharmacy.

## 2014-11-22 LAB — GLUCOSE, CAPILLARY
Glucose-Capillary: 158 mg/dL — ABNORMAL HIGH (ref 65–99)
Glucose-Capillary: 112 mg/dL — ABNORMAL HIGH (ref 65–99)
Glucose-Capillary: 175 mg/dL — ABNORMAL HIGH (ref 65–99)
Glucose-Capillary: 284 mg/dL — ABNORMAL HIGH (ref 65–99)

## 2014-11-24 NOTE — Progress Notes (Signed)
Patient ID: Mary [name], female   DOB: 10-14-1970, 44 y.o.   MRN: [id number] SUBJECTIVE: 44 y.o. female for follow up of diabetes. Diabetic Review of Systems - medication compliance: noncompliant much of the time, diabetic diet compliance: noncompliant much of the time, home glucose monitoring: is performed sporadically, further diabetic ROS: no polyuria or polydipsia, no chest pain, dyspnea or TIA's, no hypoglycemia, no medication side effects noted, has dysesthesias in the feet.  Other symptoms and concerns: Has not been able to get Levemir filled since hospital discharge due to lack of money. She has not checked sugars because she has been out of test strips.   Current Outpatient Prescriptions  Medication Sig Dispense Refill  . divalproex (DEPAKOTE) 500 MG DR tablet Take 1 tablet (500 mg total) by mouth 2 times daily at 12 noon and 4 pm. 60 tablet 0  . insulin aspart (NOVOLOG) 100 UNIT/ML FlexPen Inject 7 Units into the skin 3 (three) times daily with meals. 15 mL 11  . medroxyPROGESTERone (DEPO-PROVERA) 150 MG/ML injection Inject 150 mg into the muscle every 3 (three) months.    . metoCLOPramide (REGLAN) 5 MG tablet Take 2 tablets (10 mg total) by mouth every 6 (six) hours as needed for nausea (nausea). 30 tablet 0  . risperiDONE (RISPERDAL) 2 MG tablet Take 2 mg by mouth at bedtime.    . Insulin Detemir (LEVEMIR) 100 UNIT/ML Pen Inject 55 Units into the skin daily at 10 pm. (Patient not taking: Reported on 11/15/2014) 15 mL 11   No current facility-administered medications for this visit.    OBJECTIVE: Appearance: alert, well appearing, and in no distress and oriented to person, place, and time. BP 121/84 mmHg  Pulse 109  Temp(Src) 99 F (37.2 C)  Ht 5\' 4"  (1.626 m)  Wt 112 lb (50.803 kg)  BMI 19.22 kg/m2  SpO2 97%  Exam: heart sounds normal rate, regular rhythm, normal S1, S2, no murmurs, rubs, clicks or gallops, chest clear, no carotid bruits  ASSESSMENT: Diabetes Mellitus:  poorly controlled, patient poorly compliant and needs to follow diet more regularly Mary [name] was seen today for diabetes.  Diagnoses and all orders for this visit:  Type 2 diabetes mellitus with hyperglycemia Orders: -     Glucose (CBG) Patient will go directly to the pharmacy to pick up prescription of Levemir and test strips to take this evening.  I have stressed compliance to patient and explained the importance of coming to nurse visits so that she can be monitored closely to prevent re-hospitalization.   Hyperglycemia Orders: -     Urinalysis Dipstick -     insulin aspart (novoLOG) injection 20 Units; Inject 0.2 mLs (20 Units total) into the skin once. -     Glucose (CBG) -     Urinalysis Dipstick Peripheral IV attempted 3 times by different staff members, all were unsuccessful. Patient left cursing at staff because she felt she has been in the office too long. Patient is very high risk for re-hospitalization.   Ketonuria Patient had trace ketones. I have advised her to stay hydrated to avoid hospitalization  Return in about 2 weeks (around 12/02/2014) for Nurse Visit---cbg review with Lauren and 3 mo PCP .  [name], NP 11/24/2014 12:30 PM

## 2014-11-30 ENCOUNTER — Ambulatory Visit: Payer: Self-pay | Admitting: Internal Medicine

## 2014-12-12 ENCOUNTER — Emergency Department (HOSPITAL_COMMUNITY)
Admission: EM | Admit: 2014-12-12 | Discharge: 2014-12-12 | Disposition: A | Discharge status: Home / Self Care | Payer: Medicaid Other | Attending: Emergency Medicine | Admitting: Emergency Medicine

## 2014-12-12 ENCOUNTER — Encounter (HOSPITAL_COMMUNITY): Payer: Self-pay | Admitting: Emergency Medicine

## 2014-12-12 DIAGNOSIS — Z9049 Acquired absence of other specified parts of digestive tract: Secondary | ICD-10-CM | POA: Insufficient documentation

## 2014-12-12 DIAGNOSIS — Z87442 Personal history of urinary calculi: Secondary | ICD-10-CM | POA: Insufficient documentation

## 2014-12-12 DIAGNOSIS — Z862 Personal history of diseases of the blood and blood-forming organs and certain disorders involving the immune mechanism: Secondary | ICD-10-CM | POA: Insufficient documentation

## 2014-12-12 DIAGNOSIS — K3184 Gastroparesis: Secondary | ICD-10-CM | POA: Insufficient documentation

## 2014-12-12 DIAGNOSIS — I10 Essential (primary) hypertension: Secondary | ICD-10-CM | POA: Insufficient documentation

## 2014-12-12 DIAGNOSIS — R011 Cardiac murmur, unspecified: Secondary | ICD-10-CM | POA: Diagnosis not present

## 2014-12-12 DIAGNOSIS — Z8619 Personal history of other infectious and parasitic diseases: Secondary | ICD-10-CM | POA: Diagnosis not present

## 2014-12-12 DIAGNOSIS — Z880 Allergy status to penicillin: Secondary | ICD-10-CM | POA: Insufficient documentation

## 2014-12-12 DIAGNOSIS — R112 Nausea with vomiting, unspecified: Secondary | ICD-10-CM

## 2014-12-12 DIAGNOSIS — F1210 Cannabis abuse, uncomplicated: Secondary | ICD-10-CM

## 2014-12-12 DIAGNOSIS — Z720 Tobacco use: Secondary | ICD-10-CM | POA: Diagnosis not present

## 2014-12-12 DIAGNOSIS — E119 Type 2 diabetes mellitus without complications: Secondary | ICD-10-CM | POA: Insufficient documentation

## 2014-12-12 DIAGNOSIS — M419 Scoliosis, unspecified: Secondary | ICD-10-CM | POA: Insufficient documentation

## 2014-12-12 DIAGNOSIS — Z8739 Personal history of other diseases of the musculoskeletal system and connective tissue: Secondary | ICD-10-CM | POA: Insufficient documentation

## 2014-12-12 DIAGNOSIS — F319 Bipolar disorder, unspecified: Secondary | ICD-10-CM | POA: Diagnosis not present

## 2014-12-12 DIAGNOSIS — Z794 Long term (current) use of insulin: Secondary | ICD-10-CM | POA: Insufficient documentation

## 2014-12-12 LAB — CBC WITH DIFFERENTIAL/PLATELET
BASOS ABS: 0 10*3/uL (ref 0.0–0.1)
Basophils Relative: 0 % (ref 0–1)
EOS ABS: 0 10*3/uL (ref 0.0–0.7)
Eosinophils Relative: 0 % (ref 0–5)
HCT: 37.6 % (ref 36.0–46.0)
Hemoglobin: 12.3 g/dL (ref 12.0–15.0)
Lymphocytes Relative: 17 % (ref 12–46)
Lymphs Abs: 2 10*3/uL (ref 0.7–4.0)
MCH: 23.5 pg — ABNORMAL LOW (ref 26.0–34.0)
MCHC: 32.7 g/dL (ref 30.0–36.0)
MCV: 71.8 fL — ABNORMAL LOW (ref 78.0–100.0)
Monocytes Absolute: 0.1 10*3/uL (ref 0.1–1.0)
Monocytes Relative: 1 % — ABNORMAL LOW (ref 3–12)
NEUTROS ABS: 9.6 10*3/uL — AB (ref 1.7–7.7)
NEUTROS PCT: 82 % — AB (ref 43–77)
Platelets: 151 10*3/uL (ref 150–400)
RBC: 5.24 MIL/uL — AB (ref 3.87–5.11)
RDW: 15.6 % — ABNORMAL HIGH (ref 11.5–15.5)
WBC: 11.7 10*3/uL — AB (ref 4.0–10.5)

## 2014-12-12 LAB — COMPREHENSIVE METABOLIC PANEL
ALK PHOS: 87 U/L (ref 38–126)
ALT: 44 U/L (ref 14–54)
ANION GAP: 12 (ref 5–15)
AST: 45 U/L — ABNORMAL HIGH (ref 15–41)
Albumin: 3.7 g/dL (ref 3.5–5.0)
BILIRUBIN TOTAL: 0.6 mg/dL (ref 0.3–1.2)
BUN: 11 mg/dL (ref 6–20)
CALCIUM: 9.4 mg/dL (ref 8.9–10.3)
CHLORIDE: 105 mmol/L (ref 101–111)
CO2: 21 mmol/L — AB (ref 22–32)
CREATININE: 0.54 mg/dL (ref 0.44–1.00)
GFR calc non Af Amer: >60 mL/min (ref >60)
Glucose, Bld: 290 mg/dL — ABNORMAL HIGH (ref 65–99)
Potassium: 4.1 mmol/L (ref 3.5–5.1)
Sodium: 138 mmol/L (ref 135–145)
Total Protein: 7.4 g/dL (ref 6.5–8.1)

## 2014-12-12 LAB — URINE MICROSCOPIC-ADD ON: Urine-Other: NONE SEEN

## 2014-12-12 LAB — URINALYSIS, ROUTINE W REFLEX MICROSCOPIC
BILIRUBIN URINE: NEGATIVE
Glucose, UA: >1000 mg/dL — AB
HGB URINE DIPSTICK: NEGATIVE
Ketones, ur: 40 mg/dL — AB
Leukocytes, UA: NEGATIVE
Nitrite: NEGATIVE
PROTEIN: NEGATIVE mg/dL
Specific Gravity, Urine: 1.035 — ABNORMAL HIGH (ref 1.005–1.030)
Urobilinogen, UA: 1 mg/dL (ref 0.0–1.0)
pH: 6.5 (ref 5.0–8.0)

## 2014-12-12 LAB — RAPID URINE DRUG SCREEN, HOSP PERFORMED
Amphetamines: NOT DETECTED
Barbiturates: NOT DETECTED
Benzodiazepines: NOT DETECTED
Cocaine: NOT DETECTED
Opiates: NOT DETECTED
Tetrahydrocannabinol: POSITIVE — AB

## 2014-12-12 LAB — LIPASE, BLOOD: LIPASE: 16 U/L — AB (ref 22–51)

## 2014-12-12 MED ORDER — SODIUM CHLORIDE 0.9 % IV SOLN: INTRAVENOUS | Status: DC

## 2014-12-12 MED ORDER — ONDANSETRON HCL 4 MG/2ML IJ SOLN
4.0000 mg | Freq: Once | INTRAMUSCULAR | Status: AC
  Administered 2014-12-12: 4 mg via INTRAVENOUS
  Filled 2014-12-12: qty 2

## 2014-12-12 MED ORDER — SODIUM CHLORIDE 0.9 % IV BOLUS (SEPSIS)
1000.0000 mL | Freq: Once | INTRAVENOUS | Status: AC
  Administered 2014-12-12: 1000 mL via INTRAVENOUS

## 2014-12-12 NOTE — ED Notes (Signed)
Main lab notified of lab draws need.  Hard stick.  Two ER attempts unsuccessful

## 2014-12-12 NOTE — ED Notes (Signed)
Pt awake and crying requesting pain meds.  I spoke with Dr. Eulis Foster.  Tylenol offered to patient which she refused.  Throwing legs in air and kicking wall like a child screaming "I can't take the pain".

## 2014-12-12 NOTE — ED Notes (Signed)
Patient yelling and stating that she needs something for pain.  MD made aware. Awaiting patient's urine specimen.

## 2014-12-12 NOTE — ED Notes (Signed)
2 rn's attempted to start iv without success.  Ultrasound iv will be attempted by third RN

## 2014-12-12 NOTE — ED Notes (Signed)
Attempted to collect patient labs and I was unsuccessful.  Patient would not sit up

## 2014-12-12 NOTE — ED Notes (Signed)
Patient sleeping at present.  Pt in NAD.

## 2014-12-12 NOTE — Progress Notes (Addendum)
Sent referral to [facility] transitional program via email Pt with 8 ED visits and 5 admissions in last 6 months

## 2014-12-12 NOTE — ED Notes (Signed)
Pt quiet, eyes closed.  Appears to be resting comfortably

## 2014-12-12 NOTE — ED Provider Notes (Signed)
CSN: [id number]     Arrival date & time 12/12/14  1239 History   First MD Initiated Contact with Patient 12/12/14 1516     Chief Complaint  Patient presents with  . Abdominal Pain  . Nausea  . Emesis     (Consider location/radiation/quality/duration/timing/severity/associated sxs/prior Treatment) HPI   Mary [name] is a 44 y.o. female who presents for evaluation of abdominal pain and vomiting. She denies diarrhea. The symptoms are recurrent. She has frequent episodes of pain and vomiting associated with gastroparesis. She has not seen her PCP recently. She was recently in the ED, and had multiple ED visits in the last year for similar problems. She denies current use of illicit drugs. There are no other known modifying factors.   Past Medical History  Diagnosis Date  . Kidney stone   . Seizure disorder     started with pregnancy of first son  . Gastritis   . Bipolar 2 disorder   . Post traumatic stress disorder     "flipping out" after people close to her died  . Heart murmur   . Arthritis     r ankle-S/P surgery (2007)  . Anemia     childhood  . Depression     childhood  . Hepatitis C     biopsy in 2010-no rx-was supposed to see a hepatologist in [place].  With cirrhosis  . Neuropathy     in legs and feet and hands  . Cataracts, bilateral   . Scoliosis   . Diabetes mellitus     diagnosed in 1996-always been on insulin  . DKA (diabetic ketoacidoses)     Recurrent admissions for DKA, medication non-complaince, poor social situation  . Diabetic neuropathy, type I diabetes mellitus     numbness bilaterally feet  . HTN (hypertension) 10/12/2013   Past Surgical History  Procedure Laterality Date  . Ankle surgery  2007  . Endometrial biopsy  06/22/2012  . Cholecystectomy  04/07/2013  . Cholecystectomy N/A 04/07/2013    Procedure: LAPAROSCOPIC CHOLECYSTECTOMY WITH INTRAOPERATIVE CHOLANGIOGRAM;  Surgeon: Adin Hector, MD;  Location: [facility];  Service: General;   Laterality: N/A;  . Cesarean section      3 times   Family History  Problem Relation Age of Onset  . Diabetes Mother     currently 70  . Fibromyalgia Mother   . Cirrhosis Father     died in 33  . Diabetes Maternal Grandmother   . Diabetes Maternal Aunt    History  Substance Use Topics  . Smoking status: Current Every Day Smoker -- 0.25 packs/day for 25 years    Types: Cigarettes  . Smokeless tobacco: Never Used  . Alcohol Use: No     Comment: [place] hasn't been drinking   OB History    Gravida Para Term Preterm AB TAB SAB Ectopic Multiple Living   4 3 3  0 1 0 1 0 1 2     Review of Systems  All other systems reviewed and are negative.     Allergies  Penicillins  Home Medications   Prior to Admission medications   Medication Sig Start Date End Date Taking? Authorizing Provider  divalproex (DEPAKOTE) 500 MG DR tablet Take 1 tablet (500 mg total) by mouth 2 times daily at 12 noon and 4 pm. 08/17/14   Orson Eva, MD  insulin aspart (NOVOLOG) 100 UNIT/ML FlexPen Inject 7 Units into the skin 3 (three) times daily with meals. 11/17/14   Louellen Molder, MD  Insulin Detemir (LEVEMIR) 100 UNIT/ML Pen Inject 55 Units into the skin daily at 10 pm. Patient not taking: Reported on 11/15/2014 10/26/14   Charlynne Cousins, MD  medroxyPROGESTERone (DEPO-PROVERA) 150 MG/ML injection Inject 150 mg into the muscle every 3 (three) months.    Historical Provider, MD  metoCLOPramide (REGLAN) 5 MG tablet Take 2 tablets (10 mg total) by mouth every 6 (six) hours as needed for nausea (nausea). 11/17/14   Nishant Dhungel, MD  risperiDONE (RISPERDAL) 2 MG tablet Take 2 mg by mouth at bedtime.    Historical Provider, MD   BP 130/75 mmHg  Pulse 87  Temp(Src) 97.8 F (36.6 C) (Oral)  Resp 20  SpO2 99% Physical Exam  Constitutional: She is oriented to person, place, and time. She appears well-developed. She appears distressed (she is uncomfortable.).  She appears older than stated age.  HENT:   Head: Normocephalic and atraumatic.  Right Ear: External ear normal.  Left Ear: External ear normal.  Eyes: Conjunctivae and EOM are normal. Pupils are equal, round, and reactive to light.  Neck: Normal range of motion and phonation normal. Neck supple.  Cardiovascular: Normal rate, regular rhythm and normal heart sounds.   Pulmonary/Chest: Effort normal and breath sounds normal. No respiratory distress. She has no wheezes. She exhibits no bony tenderness.  Abdominal: Soft. There is tenderness (Diffuse, moderate.).  Musculoskeletal: Normal range of motion.  Neurological: She is alert and oriented to person, place, and time. No cranial nerve deficit or sensory deficit. She exhibits normal muscle tone. Coordination normal.  Skin: Skin is warm, dry and intact.  Psychiatric: She has a normal mood and affect. Her behavior is normal.  Nursing note and vitals reviewed.   ED Course  Procedures (including critical care time)  Medications  0.9 %  sodium chloride infusion (not administered)  0.9 %  sodium chloride infusion (not administered)  ondansetron (ZOFRAN) injection 4 mg (not administered)  sodium chloride 0.9 % bolus 1,000 mL (1,000 mLs Intravenous New Bag/Given 12/12/14 1758)    Patient Vitals for the past 24 hrs:  BP Temp Temp src Pulse Resp SpO2  12/12/14 1812 130/75 mmHg 97.8 F (36.6 C) - 87 20 99 %  12/12/14 1246 134/88 mmHg 97.8 F (36.6 C) Oral 79 21 99 %   17:28- she is sleeping, comfortably, at this time.    10:17 PM Reevaluation with update and discussion. After initial assessment and treatment, an updated evaluation reveals patient is more comfortable at this time. We discussed the implications of use of marijuana products while having a [place] to vomiting associated with gastroparesis. The patient seemed to understand this discussion. All questions were answered.Daleen Bo L     Labs Review Labs Reviewed  CBC WITH DIFFERENTIAL/PLATELET - Abnormal;  Notable for the following:    WBC 11.7 (*)    RBC 5.24 (*)    MCV 71.8 (*)    MCH 23.5 (*)    RDW 15.6 (*)    Neutrophils Relative % 82 (*)    Monocytes Relative 1 (*)    Neutro Abs 9.6 (*)    All other components within normal limits  COMPREHENSIVE METABOLIC PANEL - Abnormal; Notable for the following:    CO2 21 (*)    Glucose, Bld 290 (*)    AST 45 (*)    All other components within normal limits  LIPASE, BLOOD - Abnormal; Notable for the following:    Lipase 16 (*)    All other components within normal  limits  URINALYSIS, ROUTINE W REFLEX MICROSCOPIC (NOT AT [facility]) - Abnormal; Notable for the following:    Specific Gravity, Urine 1.035 (*)    Glucose, UA >1000 (*)    Ketones, ur 40 (*)    All other components within normal limits  URINE RAPID DRUG SCREEN, HOSP PERFORMED - Abnormal; Notable for the following:    Tetrahydrocannabinol POSITIVE (*)    All other components within normal limits  URINE MICROSCOPIC-ADD ON    Imaging Review No results found.   EKG Interpretation None      MDM   Final diagnoses:  Nausea and vomiting, vomiting of unspecified type  Marijuana abuse  Gastroparesis    Patient improved after treatment in the ED. She is a user of marijuana products may have hyperemesis cannabinoid syndrome. No evidence for serious patella, infection, metabolic instability or suspicion for impending vascular collapse.  Nursing Notes Reviewed/ Care Coordinated Applicable Imaging Reviewed Interpretation of Laboratory Data incorporated into ED treatment  The patient appears reasonably screened and/or stabilized for discharge and I doubt any other medical condition or other [facility] requiring further screening, evaluation, or treatment in the ED at this time prior to discharge.  Plan: Home Medications- usual; Home Treatments- rest; return here if the recommended treatment, does not improve the symptoms; Recommended follow up- PCP prn    Daleen Bo, MD 12/12/14  2220

## 2014-12-12 NOTE — ED Notes (Signed)
Main lab here to draw bloodwork

## 2014-12-12 NOTE — ED Notes (Signed)
Patient currently still receiving first liter of IV fluids.  Delay in next two liters.

## 2014-12-12 NOTE — ED Notes (Signed)
Per EMS pt comes from home for abd pain, n/v that started today.  Pt has PMH gastroparesis.  CBG 292, pt hasnt had any insulin today but took her Lantus last night. 134/89, 98% on room air.

## 2014-12-12 NOTE — ED Notes (Signed)
Pt stated that she gave urine.  Urine is not seen.  Pt stated that she does not have to urinate.   Will check in 30 minutes.

## 2014-12-12 NOTE — ED Notes (Signed)
Patient is resting comfortably. 

## 2014-12-12 NOTE — ED Notes (Signed)
Unable to collect labs patient will not sit up in chair.

## 2014-12-12 NOTE — Hospital Discharge Follow-Up (Signed)
Referral received from Jackelyn Poling, [place].   Spoke to the patient briefly about receiving her medical care  at the [facility]. She has been seen by [name], NP.  The patient was in pain and did not want to talk at the time this CM was with her.  She was agreeable to having this CM call her tomorrow to schedule a follow up appointment. . The patient confirmed that the best # to reach her is [phone].

## 2014-12-12 NOTE — ED Notes (Signed)
Patient continues to sleep.  Patient in NAD.

## 2014-12-12 NOTE — Discharge Instructions (Signed)
It is important to avoid use of marijuana products when you have gastroparesis. Marijuana is well known to cause nausea, vomiting, and will worsen your symptoms of gastroparesis.    Nausea and Vomiting Nausea is a sick feeling that often comes before throwing up (vomiting). Vomiting is a reflex where stomach contents come out of your mouth. Vomiting can cause severe loss of body fluids (dehydration). Children and elderly adults can become dehydrated quickly, especially if they also have diarrhea. Nausea and vomiting are symptoms of a condition or disease. It is important to find the cause of your symptoms. CAUSES   Direct irritation of the stomach lining. This irritation can result from increased acid production (gastroesophageal reflux disease), infection, food poisoning, taking certain medicines (such as nonsteroidal anti-inflammatory drugs), alcohol use, or tobacco use.  Signals from the brain.These signals could be caused by a headache, heat exposure, an inner ear disturbance, increased pressure in the brain from injury, infection, a tumor, or a concussion, pain, emotional stimulus, or metabolic problems.  An obstruction in the gastrointestinal tract (bowel obstruction).  Illnesses such as diabetes, hepatitis, gallbladder problems, appendicitis, kidney problems, cancer, sepsis, atypical symptoms of a heart attack, or eating disorders.  Medical treatments such as chemotherapy and radiation.  Receiving medicine that makes you sleep (general anesthetic) during surgery. DIAGNOSIS Your caregiver may ask for tests to be done if the problems do not improve after a few days. Tests may also be done if symptoms are severe or if the reason for the nausea and vomiting is not clear. Tests may include:  Urine tests.  Blood tests.  Stool tests.  Cultures (to look for evidence of infection).  X-rays or other imaging studies. Test results can help your caregiver make decisions about treatment or  the need for additional tests. TREATMENT You need to stay well hydrated. Drink frequently but in small amounts.You may wish to drink water, sports drinks, clear broth, or eat frozen ice pops or gelatin dessert to help stay hydrated.When you eat, eating slowly may help prevent nausea.There are also some antinausea medicines that may help prevent nausea. HOME CARE INSTRUCTIONS   Take all medicine as directed by your caregiver.  If you do not have an appetite, do not force yourself to eat. However, you must continue to drink fluids.  If you have an appetite, eat a normal diet unless your caregiver tells you differently.  Eat a variety of complex carbohydrates (rice, wheat, potatoes, bread), lean meats, yogurt, fruits, and vegetables.  Avoid high-fat foods because they are more difficult to digest.  Drink enough water and fluids to keep your urine clear or pale yellow.  If you are dehydrated, ask your caregiver for specific rehydration instructions. Signs of dehydration may include:  Severe thirst.  Dry lips and mouth.  Dizziness.  Dark urine.  Decreasing urine frequency and amount.  Confusion.  Rapid breathing or pulse. SEEK IMMEDIATE MEDICAL CARE IF:   You have blood or brown flecks (like coffee grounds) in your vomit.  You have black or bloody stools.  You have a severe headache or stiff neck.  You are confused.  You have severe abdominal pain.  You have chest pain or trouble breathing.  You do not urinate at least once every 8 hours.  You develop cold or clammy skin.  You continue to vomit for longer than 24 to 48 hours.  You have a fever. MAKE SURE YOU:   Understand these instructions.  Will watch your condition.  Will get help  right away if you are not doing well or get worse. Document Released: 06/03/2005 Document Revised: 08/26/2011 Document Reviewed: 10/31/2010 [facility] Patient Information 2015 [place], [place]. This information is not intended to  replace advice given to you by your health care provider. Make sure you discuss any questions you have with your health care provider.  Gastroparesis  Gastroparesis is also called slowed stomach emptying (delayed gastric emptying). It is a condition in which the stomach takes too long to empty its contents. It often happens in people with diabetes.  CAUSES  Gastroparesis happens when nerves to the stomach are damaged or stop working. When the nerves are damaged, the muscles of the stomach and intestines do not work normally. The movement of food is slowed or stopped. High blood glucose (sugar) causes changes in nerves and can damage the blood vessels that carry oxygen and nutrients to the nerves. RISK FACTORS  Diabetes.  Post-viral syndromes.  Eating disorders (anorexia, bulimia).  Surgery on the stomach or vagus nerve.  Gastroesophageal reflux disease (rarely).  Smooth muscle disorders (amyloidosis, scleroderma).  Metabolic disorders, including hypothyroidism.  Parkinson disease. SYMPTOMS   Heartburn.  Feeling sick to your stomach (nausea).  Vomiting of undigested food.  An early feeling of fullness when eating.  Weight loss.  Abdominal bloating.  Erratic blood glucose levels.  Lack of appetite.  Gastroesophageal reflux.  Spasms of the stomach wall. Complications can include:  Bacterial overgrowth in stomach. Food stays in the stomach and can ferment and cause bacteria to grow.  Weight loss due to difficulty digesting and absorbing nutrients.  Vomiting.  Obstruction in the stomach. Undigested food can harden and cause nausea and vomiting.  Blood glucose fluctuations caused by inconsistent food absorption. DIAGNOSIS  The diagnosis of gastroparesis is confirmed through one or more of the following tests:  Barium X-rays and scans. These tests look at how long it takes for food to move through the stomach.  Gastric manometry. This test measures electrical and  muscular activity in the stomach. A thin tube is passed down the throat into the stomach. The tube contains a wire that takes measurements of the stomach's electrical and muscular activity as it digests liquids and solid food.  Endoscopy. This procedure is done with a long, thin tube called an endoscope. It is passed through the mouth and gently down the esophagus into the stomach. This tube helps the caregiver look at the lining of the stomach to check for any abnormalities.  Ultrasonography. This can rule out gallbladder disease or pancreatitis. This test will outline and define the shape of the gallbladder and pancreas. TREATMENT   Treatments may include:  Exercise.  Medicines to control nausea and vomiting.  Medicines to stimulate stomach muscles.  Changes in what and when you eat.  Having smaller meals more often.  Eating low-fiber forms of high-fiber foods, such as eating cooked vegetables instead of raw vegetables.  Eating low-fat foods.  Consuming liquids, which are easier to digest.  In severe cases, feeding tubes and intravenous (IV) feeding may be needed. It is important to note that in most cases, treatment does not cure gastroparesis. It is usually a lasting (chronic) condition. Treatment helps you manage the underlying condition so that you can be as healthy and comfortable as possible. Other treatments  A gastric neurostimulator has been developed to assist people with gastroparesis. The battery-operated device is surgically implanted. It emits mild electrical pulses to help improve stomach emptying and to control nausea and vomiting.  The use  of botulinum toxin has been shown to improve stomach emptying by decreasing the prolonged contractions of the muscle between the stomach and the small intestine (pyloric sphincter). The benefits are temporary. SEEK MEDICAL CARE IF:   You have diabetes and you are having problems keeping your blood glucose in goal range.  You  are having nausea, vomiting, bloating, or early feelings of fullness with eating.  Your symptoms do not change with a change in diet. Document Released: 06/03/2005 Document Revised: 09/28/2012 Document Reviewed: 11/10/2008 [facility] Patient Information 2015 [place], [place]. This information is not intended to replace advice given to you by your health care provider. Make sure you discuss any questions you have with your health care provider.

## 2014-12-13 ENCOUNTER — Telehealth: Payer: Self-pay

## 2014-12-13 NOTE — Telephone Encounter (Signed)
Called the patient to check on her status and to discuss follow up at the [facility].  Voice mail message left on # [phone] (c) and # [phone] ( h) requesting a call back to # [phone] or [phone].

## 2014-12-14 ENCOUNTER — Telehealth: Payer: Self-pay

## 2014-12-14 NOTE — Telephone Encounter (Signed)
Called the patient to check on her status and to discuss follow up at the [facility].  Voice mail message left on # [phone] (c) and # [phone] ( h) requesting a call back to # [phone] or [phone].

## 2015-02-16 DEATH — deceased
